# Patient Record
Sex: Male | Born: 1940 | Race: White | Hispanic: No | Marital: Single | Smoking: Never smoker
Health system: Southern US, Community
[De-identification: ages and names within clinical notes are randomized; demographics above are authoritative.]

## PROBLEM LIST (undated history)

## (undated) DIAGNOSIS — J449 Chronic obstructive pulmonary disease, unspecified: Secondary | ICD-10-CM

## (undated) DIAGNOSIS — Z87442 Personal history of urinary calculi: Secondary | ICD-10-CM

## (undated) DIAGNOSIS — E7800 Pure hypercholesterolemia, unspecified: Secondary | ICD-10-CM

## (undated) DIAGNOSIS — Z2239 Carrier of other specified bacterial diseases: Secondary | ICD-10-CM

## (undated) DIAGNOSIS — R339 Retention of urine, unspecified: Secondary | ICD-10-CM

## (undated) DIAGNOSIS — I10 Essential (primary) hypertension: Secondary | ICD-10-CM

## (undated) DIAGNOSIS — R569 Unspecified convulsions: Secondary | ICD-10-CM

## (undated) DIAGNOSIS — R319 Hematuria, unspecified: Secondary | ICD-10-CM

## (undated) DIAGNOSIS — A0472 Enterocolitis due to Clostridium difficile, not specified as recurrent: Secondary | ICD-10-CM

## (undated) DIAGNOSIS — N189 Chronic kidney disease, unspecified: Secondary | ICD-10-CM

## (undated) DIAGNOSIS — F209 Schizophrenia, unspecified: Secondary | ICD-10-CM

## (undated) DIAGNOSIS — F319 Bipolar disorder, unspecified: Secondary | ICD-10-CM

## (undated) DIAGNOSIS — K219 Gastro-esophageal reflux disease without esophagitis: Secondary | ICD-10-CM

## (undated) DIAGNOSIS — N4889 Other specified disorders of penis: Secondary | ICD-10-CM

## (undated) DIAGNOSIS — I509 Heart failure, unspecified: Secondary | ICD-10-CM

## (undated) DIAGNOSIS — I82409 Acute embolism and thrombosis of unspecified deep veins of unspecified lower extremity: Secondary | ICD-10-CM

---

## 2003-10-12 ENCOUNTER — Other Ambulatory Visit: Payer: Self-pay

## 2004-04-17 ENCOUNTER — Other Ambulatory Visit: Payer: Self-pay

## 2004-07-17 ENCOUNTER — Emergency Department: Payer: Self-pay | Admitting: Emergency Medicine

## 2004-08-28 ENCOUNTER — Ambulatory Visit: Payer: Self-pay | Admitting: Psychiatry

## 2004-09-24 ENCOUNTER — Other Ambulatory Visit: Payer: Self-pay

## 2004-09-24 ENCOUNTER — Emergency Department: Payer: Self-pay | Admitting: Emergency Medicine

## 2004-11-27 ENCOUNTER — Emergency Department: Payer: Self-pay | Admitting: Emergency Medicine

## 2005-03-02 ENCOUNTER — Emergency Department: Payer: Self-pay | Admitting: Emergency Medicine

## 2005-03-15 ENCOUNTER — Ambulatory Visit: Payer: Self-pay | Admitting: Cardiovascular Disease

## 2005-03-18 ENCOUNTER — Ambulatory Visit: Payer: Self-pay | Admitting: Cardiovascular Disease

## 2008-04-05 ENCOUNTER — Ambulatory Visit: Payer: Self-pay | Admitting: Internal Medicine

## 2008-10-04 ENCOUNTER — Ambulatory Visit: Payer: Self-pay | Admitting: Internal Medicine

## 2009-04-03 ENCOUNTER — Ambulatory Visit: Payer: Self-pay | Admitting: Internal Medicine

## 2009-06-05 ENCOUNTER — Emergency Department: Payer: Self-pay

## 2009-06-05 ENCOUNTER — Ambulatory Visit: Payer: Self-pay | Admitting: Internal Medicine

## 2010-01-19 ENCOUNTER — Ambulatory Visit: Payer: Self-pay | Admitting: Unknown Physician Specialty

## 2012-02-14 LAB — CBC
HGB: 13.6 g/dL (ref 13.0–18.0)
MCH: 28.2 pg (ref 26.0–34.0)
MCHC: 33.1 g/dL (ref 32.0–36.0)
MCV: 85 fL (ref 80–100)
RDW: 14 % (ref 11.5–14.5)
WBC: 16.6 10*3/uL — ABNORMAL HIGH (ref 3.8–10.6)

## 2012-02-14 LAB — BASIC METABOLIC PANEL
Anion Gap: 10 (ref 7–16)
Calcium, Total: 8.6 mg/dL (ref 8.5–10.1)
Chloride: 103 mmol/L (ref 98–107)
Co2: 28 mmol/L (ref 21–32)
EGFR (Non-African Amer.): >60
Glucose: 165 mg/dL — ABNORMAL HIGH (ref 65–99)
Osmolality: 285 (ref 275–301)
Potassium: 3.2 mmol/L — ABNORMAL LOW (ref 3.5–5.1)

## 2012-02-14 LAB — URINALYSIS, COMPLETE
Blood: NEGATIVE
Hyaline Cast: 3
Nitrite: NEGATIVE
Ph: 6 (ref 4.5–8.0)
Protein: NEGATIVE
Specific Gravity: 1.012 (ref 1.003–1.030)

## 2012-02-15 ENCOUNTER — Inpatient Hospital Stay: Payer: Self-pay | Admitting: Internal Medicine

## 2012-02-16 LAB — CBC WITH DIFFERENTIAL/PLATELET
Basophil #: 0 10*3/uL (ref 0.0–0.1)
Basophil %: 0.3 %
Eosinophil #: 0 10*3/uL (ref 0.0–0.7)
HGB: 12.2 g/dL — ABNORMAL LOW (ref 13.0–18.0)
Lymphocyte #: 0.8 10*3/uL — ABNORMAL LOW (ref 1.0–3.6)
MCH: 28.7 pg (ref 26.0–34.0)
MCHC: 33.5 g/dL (ref 32.0–36.0)
MCV: 86 fL (ref 80–100)
Monocyte #: 0.4 x10 3/mm (ref 0.2–1.0)
Neutrophil #: 9.6 10*3/uL — ABNORMAL HIGH (ref 1.4–6.5)
Neutrophil %: 88.1 %
WBC: 10.9 10*3/uL — ABNORMAL HIGH (ref 3.8–10.6)

## 2012-02-16 LAB — BASIC METABOLIC PANEL
Anion Gap: 8 (ref 7–16)
BUN: 20 mg/dL — ABNORMAL HIGH (ref 7–18)
Chloride: 105 mmol/L (ref 98–107)
Co2: 29 mmol/L (ref 21–32)
Creatinine: 1.14 mg/dL (ref 0.60–1.30)
EGFR (African American): >60
EGFR (Non-African Amer.): >60
Glucose: 143 mg/dL — ABNORMAL HIGH (ref 65–99)
Potassium: 3.1 mmol/L — ABNORMAL LOW (ref 3.5–5.1)
Sodium: 142 mmol/L (ref 136–145)

## 2012-02-16 LAB — URINE CULTURE

## 2012-02-20 LAB — CULTURE, BLOOD (SINGLE)

## 2012-03-29 ENCOUNTER — Emergency Department: Payer: Self-pay | Admitting: *Deleted

## 2012-03-29 LAB — CK TOTAL AND CKMB (NOT AT ARMC)
CK, Total: 99 U/L (ref 35–232)
CK-MB: 1.4 ng/mL (ref 0.5–3.6)

## 2012-03-29 LAB — COMPREHENSIVE METABOLIC PANEL
Albumin: 3.4 g/dL (ref 3.4–5.0)
Anion Gap: 9 (ref 7–16)
BUN: 11 mg/dL (ref 7–18)
Bilirubin,Total: 0.7 mg/dL (ref 0.2–1.0)
Calcium, Total: 8.9 mg/dL (ref 8.5–10.1)
Chloride: 108 mmol/L — ABNORMAL HIGH (ref 98–107)
Co2: 27 mmol/L (ref 21–32)
EGFR (African American): 55 — ABNORMAL LOW
EGFR (Non-African Amer.): 47 — ABNORMAL LOW
Osmolality: 289 (ref 275–301)
Potassium: 3 mmol/L — ABNORMAL LOW (ref 3.5–5.1)
SGOT(AST): 20 U/L (ref 15–37)
Sodium: 144 mmol/L (ref 136–145)
Total Protein: 7.5 g/dL (ref 6.4–8.2)

## 2012-03-29 LAB — CBC
HCT: 41.4 % (ref 40.0–52.0)
MCH: 29.4 pg (ref 26.0–34.0)
MCHC: 34.4 g/dL (ref 32.0–36.0)
MCV: 85 fL (ref 80–100)
Platelet: 187 10*3/uL (ref 150–440)
RDW: 14.3 % (ref 11.5–14.5)

## 2012-03-29 LAB — TROPONIN I: Troponin-I: <0.02 ng/mL

## 2012-10-02 ENCOUNTER — Inpatient Hospital Stay: Payer: Self-pay | Admitting: Internal Medicine

## 2012-10-02 LAB — COMPREHENSIVE METABOLIC PANEL
Albumin: 3.4 g/dL (ref 3.4–5.0)
Alkaline Phosphatase: 114 U/L (ref 50–136)
Anion Gap: 9 (ref 7–16)
Chloride: 105 mmol/L (ref 98–107)
Co2: 25 mmol/L (ref 21–32)
Creatinine: 1.64 mg/dL — ABNORMAL HIGH (ref 0.60–1.30)
Glucose: 194 mg/dL — ABNORMAL HIGH (ref 65–99)
SGOT(AST): 17 U/L (ref 15–37)
Sodium: 139 mmol/L (ref 136–145)
Total Protein: 7.1 g/dL (ref 6.4–8.2)

## 2012-10-02 LAB — URINALYSIS, COMPLETE
Bilirubin,UR: NEGATIVE
Glucose,UR: NEGATIVE mg/dL (ref 0–75)
Ketone: NEGATIVE
Nitrite: NEGATIVE
Ph: 7 (ref 4.5–8.0)
RBC,UR: 4 /HPF (ref 0–5)
Specific Gravity: 1.027 (ref 1.003–1.030)
WBC UR: 21 /HPF (ref 0–5)

## 2012-10-02 LAB — CBC WITH DIFFERENTIAL/PLATELET
Basophil #: 0.1 10*3/uL (ref 0.0–0.1)
Eosinophil #: 0 10*3/uL (ref 0.0–0.7)
Eosinophil %: 0.1 %
HCT: 39.4 % — ABNORMAL LOW (ref 40.0–52.0)
HGB: 13.3 g/dL (ref 13.0–18.0)
Lymphocyte #: 0.6 10*3/uL — ABNORMAL LOW (ref 1.0–3.6)
Lymphocyte %: 3.4 %
MCH: 29 pg (ref 26.0–34.0)
MCHC: 33.6 g/dL (ref 32.0–36.0)
MCV: 86 fL (ref 80–100)
Neutrophil #: 16.4 10*3/uL — ABNORMAL HIGH (ref 1.4–6.5)
Platelet: 186 10*3/uL (ref 150–440)
RBC: 4.58 10*6/uL (ref 4.40–5.90)
RDW: 14.3 % (ref 11.5–14.5)

## 2012-10-02 LAB — LIPASE, BLOOD: Lipase: 110 U/L (ref 73–393)

## 2012-10-02 LAB — MAGNESIUM: Magnesium: 1.3 mg/dL — ABNORMAL LOW

## 2012-10-02 LAB — CK TOTAL AND CKMB (NOT AT ARMC): CK-MB: 0.7 ng/mL (ref 0.5–3.6)

## 2012-10-03 LAB — CBC WITH DIFFERENTIAL/PLATELET
Basophil %: 0.8 %
HCT: 36.9 % — ABNORMAL LOW (ref 40.0–52.0)
Lymphocyte #: 1.6 10*3/uL (ref 1.0–3.6)
Lymphocyte %: 9 %
MCV: 86 fL (ref 80–100)
Monocyte #: 0.6 x10 3/mm (ref 0.2–1.0)
Monocyte %: 3.5 %
Neutrophil #: 15.4 10*3/uL — ABNORMAL HIGH (ref 1.4–6.5)
Neutrophil %: 86.5 %
Platelet: 154 10*3/uL (ref 150–440)
RBC: 4.28 10*6/uL — ABNORMAL LOW (ref 4.40–5.90)
RDW: 14.4 % (ref 11.5–14.5)
WBC: 17.8 10*3/uL — ABNORMAL HIGH (ref 3.8–10.6)

## 2012-10-03 LAB — BASIC METABOLIC PANEL
Anion Gap: 7 (ref 7–16)
BUN: 14 mg/dL (ref 7–18)
Calcium, Total: 8.2 mg/dL — ABNORMAL LOW (ref 8.5–10.1)
Creatinine: 1.02 mg/dL (ref 0.60–1.30)
EGFR (African American): >60
Glucose: 107 mg/dL — ABNORMAL HIGH (ref 65–99)
Osmolality: 282 (ref 275–301)
Potassium: 3.1 mmol/L — ABNORMAL LOW (ref 3.5–5.1)

## 2012-10-04 LAB — WBC: WBC: 10.8 10*3/uL — ABNORMAL HIGH (ref 3.8–10.6)

## 2012-10-04 LAB — URINE CULTURE

## 2012-10-07 LAB — CULTURE, BLOOD (SINGLE)

## 2014-11-15 DIAGNOSIS — G40909 Epilepsy, unspecified, not intractable, without status epilepticus: Secondary | ICD-10-CM | POA: Insufficient documentation

## 2014-11-15 DIAGNOSIS — I6523 Occlusion and stenosis of bilateral carotid arteries: Secondary | ICD-10-CM | POA: Insufficient documentation

## 2014-11-15 DIAGNOSIS — R600 Localized edema: Secondary | ICD-10-CM | POA: Insufficient documentation

## 2014-11-15 DIAGNOSIS — E782 Mixed hyperlipidemia: Secondary | ICD-10-CM | POA: Insufficient documentation

## 2014-11-15 DIAGNOSIS — E669 Obesity, unspecified: Secondary | ICD-10-CM | POA: Insufficient documentation

## 2014-11-15 DIAGNOSIS — I350 Nonrheumatic aortic (valve) stenosis: Secondary | ICD-10-CM | POA: Insufficient documentation

## 2014-11-15 DIAGNOSIS — J449 Chronic obstructive pulmonary disease, unspecified: Secondary | ICD-10-CM | POA: Insufficient documentation

## 2014-11-15 DIAGNOSIS — K219 Gastro-esophageal reflux disease without esophagitis: Secondary | ICD-10-CM | POA: Insufficient documentation

## 2014-11-25 DIAGNOSIS — I10 Essential (primary) hypertension: Secondary | ICD-10-CM | POA: Insufficient documentation

## 2014-11-29 NOTE — Consult Note (Signed)
Brief Consult Note: Diagnosis: L ureterolithiasis. R nephrolithiasis. BPH.   Patient was seen by consultant.   Consult note dictated.   Recommend further assessment or treatment.   Orders entered.   Discussed with Attending MD.   Comments: Strain urine. Provide non-NSAID analgesics and discontinue aspirin and all blood thinners if possible, so that stone can be treated if he fails to pass it within 2 weeks.  Electronic Signatures: Orson ApeWolff, Erina Hamme R (MD)  (Signed 24-Feb-14 12:19)  Authored: Brief Consult Note   Last Updated: 24-Feb-14 12:19 by Orson ApeWolff, Jaheem Hedgepath R (MD)

## 2014-11-29 NOTE — Consult Note (Signed)
PATIENT NAME:  [name], [name] MR#:  [id number] DATE OF BIRTH:  [birth year]  DATE OF CONSULTATION:  10/02/2012  REFERRING PHYSICIAN:  Enedina FinnerSona Patel, MD CONSULTING PHYSICIAN:  Suszanne ConnersMichael R. Evelene CroonWolff, MD  REASON FOR CONSULTATION: Kidney stone.   HISTORY OF PRESENT ILLNESS: Mr. [name] is a 74 year old Caucasian male who fell out of bed and landed on his back resulting in back pain. This prompted a CT scan which indicated the presence of a 6 mm proximal left ureteral stone with mild hydronephrosis and a 3 mm right lower pole stone. The patient denies flank pain or gross hematuria. He denied having any other prior stone episodes. He apparently has a history of BPH and chronically takes Avodart and Terazosin. He states that his medications are prescribed by his primary care physician who is Dr. Lacie ScottsNiemeyer and does not recall having seen a urologist recently.   ALLERGIES:  PENICILLIN.   CURRENT MEDICATIONS: Include Xopenex, vitamin D, Ventolin, terazosin, pravastatin, phenobarbital, omeprazole, Lasix, Diovan, Coreg, Colace, clonidine, Bupropion, Avodart, aspirin and Abilify.  PAST SURGICAL HISTORY: The patient denied any prior procedures, specifically kidney or prostate surgery or bladder surgery.   PAST AND CURRENT MEDICAL CONDITIONS:  1.  Bipolar disease.  2.  Epilepsy.  3.  COPD. 4.  Hypertension.  5.  GERD.  6.  Hypercholesterolemia.  7.  Schizophrenia. 8.  Benign prostatic hypertrophy.  REVIEW OF SYSTEMS: The patient denied gross hematuria, dysuria, fever, chills or flank pain. He also denied nausea and vomiting. He does have urinary symptoms including weak stream, frequency, urgency and nocturia.   PHYSICAL EXAMINATION: GENERAL: Obese and chronically ill-appearing white male in no acute distress.  HEENT: Sclerae were clear. Pupils were equal, round and reactive to light and accommodation.  NECK: Supple. No palpable cervical adenopathy.  LUNGS: Clear to auscultation.  ABDOMEN: S oft. No CVA  tenderness.  GENITOURINARY: Uncircumcised. Testes smooth, nontender, 18 mL in size each.  RECTAL: 40 gram smooth nontender prostate.   PERTINENT LABORATORY STUDIES: Include hematocrit of 39.4% with a white cell count of 17,700. BUN was 15 and creatinine 1.64.  DIAGNOSTIC DATA: CT scan report dated February 24th was reviewed.   IMPRESSION: 1.  Possible left proximal ureteral stone.  2.  Right nephrolithiasis.  3.  Benign prostatic hypertrophy.   PLAN: 1. Increase fluid intake and strain urine. Provided the patient with a strainer and instructions. 2. The 6 mm stone has a high likelihood of passing spontaneously and intervention would be indicated if he fails to pass the stone within 2 weeks or he develops intractable pain, sepsis or intractable hematuria. 3. Continue terazosin as this may help with stone expulsion.  4. Discontinue aspirin and avoid NSAIDs in case he does need ureteroscopy or lithotripsy to remove the stone, if he fails to pass it within 2 weeks. ___________________________ Suszanne ConnersMichael R. Evelene CroonWolff, MD mrw:sb D: 10/02/2012 12:32:13 ET    T: 10/02/2012 12:56:46 ET        JOB#: [id number] cc: Suszanne ConnersMichael R. Evelene CroonWolff, MD, <Dictator> Orson ApeMICHAEL R WOLFF MD ELECTRONICALLY SIGNED 10/02/2012 13:37

## 2014-11-29 NOTE — H&P (Signed)
PATIENT NAME:  [name], [name] MR#:  [id number] DATE OF BIRTH:  [birth year]  DATE OF ADMISSION:  10/02/2012  PRIMARY CARE PHYSICIAN: Dr. Lacie [name].   CHIEF COMPLAINT: Fall and dizziness today.   HISTORY OF PRESENT ILLNESS: [name] is a 74 year old morbidly obese Caucasian gentleman with history of bipolar disorder, hypertension, hypogonadism, depression, who comes to the Emergency Room after he had sustained a fall at [name]. The patient states he got out of bed, fell this morning, felt dizzy, and tried to get out of bed and felt dizzy and ended up on the floor.   There is no major injury reported in the Emergency Room. The patient initially had, en route, a  blood pressure that was stable. Heart rate dropped out into the upper 100s. He became diaphoretic. He was found noted to be tachycardic in the 100s. UA showed 1+ leukocyte esterase, with positive WBCs.   CT of the abdomen, pelvis showed mild hydronephrosis on the left secondary to a 6-mm stone in the proximal portion of the left ureter.   The patient denies any nausea. Complains of a fever and diaphoresis yesterday at [name]. He was also noted to have a white count of 17.7 and an elevated creatinine of 1.64. His baseline creatinine was 1.1 in July of 2003.   He is being admitted for sepsis secondary to possible acute pyelo, secondary to nephrolithiasis and acute renal failure.   The patient is receiving IV fluids, and he has received a dose of IV Rocephin. Blood cultures and urine cultures have been sent to the lab.   PAST MEDICAL HISTORY:  1. Depression.  2. Hypogonadism.  3. BPH.  4. Hypercholesterolemia.  5. Bipolar disorder.  6. COPD.  7. GERD.  8. Hypertension.  9. History of schizophrenia.  10. History of seizure disorder.   MEDICATIONS:  1. Vitamin D3, 1000 units daily.  2. Ventolin HFA 2 puffs q. 6 h. as needed.  3. Terazosin 5 mg p.o. daily.  4. Pravastatin 40 mg daily.  5.  Phenobarbital 64.8 mg, two tablets every other day.  6. Omeprazole 20 mg daily.  7. Levalbuterol nebulizers, 1.25 mg, which is 3 mL inhaled three times a day as needed.  8. Lasix 40 mg twice daily.   9. Docusate one tablet daily. 10. Diovan 320 p.o. daily.  11. Clonidine 0.2 mg twice daily.  12. Carvedilol 25 mg twice daily.  13. Bupropion XL 300 mg daily.  14. Avodart 0.5 mg daily.  15. Aspirin 81 mg daily.  16. Amlodipine 5 mg daily.  17. Amitizia 24 mcg p.o. twice daily.  18. Tylenol 500 q. 6., p.r.n.  19. Abilify 20 mg daily.   ALLERGIES: PENICILLIN.   SOCIAL HISTORY: The patient is a nonsmoker. No alcohol use. He lives at [name]. He reports working in [name] before.   FAMILY HISTORY: Positive for hypertension.   REVIEW OF SYSTEMS.  CONSTITUTIONAL: No fever. Positive for fatigue, weakness.  EYES: No blurred or double vision, or glaucoma.   ENT: No tinnitus, ear pain, hearing loss.  RESPIRATORY: No cough, wheeze, hemoptysis, or chronic obstructive pulmonary disease.  CARDIOVASCULAR: No chest pain, orthopnea. Positive for leg edema, which is chronic.  Positive for hypertension.  GASTROINTESTINAL: No nausea, vomiting, diarrhea, or abdominal pain.  GENITOURINARY: No dysuria, hematuria, or frequency.  ENDOCRINE: No polyuria, nocturia, thyroid problems.  HEMATOLOGY: No anemia or easy bruising.  SKIN: The patient has chronic lymphedematous changes in both the lower extremities.  MUSCULOSKELETAL: Positive for arthritis.  NEUROLOGIC: No CVA or TIAs.  PSYCH: Positive for bipolar and schizophrenia.   All other systems reviewed and negative.   PHYSICAL EXAMINATION: GENERAL: The patient is awake, alert, oriented x3, not in acute distress. Morbidly obese. VITAL SIGNS: He is afebrile, pulse is 102, regular. Respirations 21, blood pressure is 149/66. Sats are 98% on room air.  HEENT: Atraumatic, normocephalic. PERRLA. EOMI intact. Oral mucosa is dry.  NECK: Supple. No JVD. No  carotid bruit.  LUNGS: Clear to auscultation bilaterally. No rales, rhonchi, respiratory distress, or labored breathing.  CARDIOVASCULAR: Tachycardia present. Both the heart sounds are normal. No murmur heard. PMI not lateralized.  EXTREMITIES: The patient has chronic venous stasis changes along with chronic bilateral lymphedema, with hyperpigmentation in both the lower extremities. Unable to palpate pulses secondary to his significant edema.  SKIN: Looks okay. Appears to be at baseline.  NEURO EXAM: Grossly intact cranial nerves II-XII. No motor or sensory deficits.  PSYCH: The patient is awake, alert, oriented x3.  SKIN: Warm and dry.   LABS:  EKG shows sinus tachycardia. B-type natriuretic peptide is 435.   Cardiac enzymes: First set negative. Glucose is 194. BUN 15, creatinine 1.64, sodium is 139, potassium 3.3, chloride is 105, bicarb is 25, calcium is 8.5, bilirubin is 0.6.   His white count is 17.7, H and H is  13.3 and 39.4, platelet count is 186.   CHEST X-RAY: No acute cardiopulmonary abnormality.   CT of the head shows no acute intracranial abnormality.   CT angiography of the chest, abdomen, and pelvis shows no evidence of abdominal aortic aneurysm or dissection. There is mild hydronephrosis on the  left secondary to 6-mm stone in the proximal portion of the left ureter. There are hypodensities in both kidneys, compatible with cortical cysts.   UA positive for UTI.   ASSESSMENT:  A 74 year old [name] with a history of bipolar/schizophrenia, hypertension, morbid obesity, chronic obstructive pulmonary disease, who comes in with fall at [name]. Is being admitted with:   1. Sepsis, suspected due to acute pyelonephritis secondary to left ureterolithiasis, with positive  urinalysis, tachycardia, transient hypotension: The patient is going to be admitted to the medical floor. Will continue IV fluids for hydration. Continue IV Rocephin. Follow up blood cultures and urine  cultures. Will also have urology see patient for ureterolithiasis.  2. Acute left proximal ureterolithiasis with mild hydronephrosis on the left. I will continue IV hydration, have strain patient's urine to ensure if stone is passed. Will have urology see the patient in consultation; p.r.n. pain medications.  3. Hypertension: I will continue the patient's clonidine and amlodipine, along with carvedilol.  I will hold off on Lasix, then add Diovan given elevated creatinine.  4. Acute renal failure: Appears prerenal azotemia secondary to #1. Baseline creatinine is 1.1. Will continue IV hydration, follow up intakes and outputs. Avoid nephrotoxins, and monitor creatinine.  5. Gastroesophageal reflux disease: Continue omeprazole.  6. History of seizure disorder: On phenobarbital which we will continue.  7. Benign prostatic hypertrophy: Continue terazosin.  8. History of bipolar disorder/schizophrenia: Will continue patient's Abilify, bupropion.  9. Chronic obstructive pulmonary disease: Continue oral inhalers and p.r.n. Xopenex nebulizers.  10. Deep vein thrombosis: Prophylaxis with subcutaneous heparin.  11. Further workup according to the patient's clinical course.   Hospital admission plan was discussed with the patient. No family members present in the Emergency Room.   Time spent: 50 minutes.     ____________________________ Wylie Hail Allena Katz,  MD sap:dm D: 10/02/2012 10:49:04 ET T: 10/02/2012 11:44:15 ET JOB#: [id number]  cc: Alenna Russell A. Allena Katz, MD, <Dictator> Meindert A. [name] Scotts, MD Willow Ora MD ELECTRONICALLY SIGNED 10/05/2012 14:33

## 2014-11-29 NOTE — Discharge Summary (Signed)
PATIENT NAME:  [name], [name] MR#:  [id number] DATE OF BIRTH:  [birth year]  DATE OF ADMISSION:  10/02/2012 DATE OF DISCHARGE:  10/04/2012  ADMITTING PHYSICIAN: Sona A. Allena KatzPatel, MD  DISCHARGING PHYSICIAN: Enid Baasadhika Elgin Carn, MD  PRIMARY CARE PHYSICIAN: Meindert A. Lacie ScottsNiemeyer, MD  CONSULTATIONS IN THE HOSPITAL: Urology consultation by Dr. Evelene CroonWolff.  DISCHARGE DIAGNOSES:  1.  Systemic inflammatory response syndrome.  2.  Acute pyelonephritis.  3.  Nephrolithiasis.  4.  Streptococcus agalactiae urinary tract infection. 5.  Benign prostatic hypertrophy.  6.  Mild left-sided hydronephrosis with 6 mm proximal left ureteral stone. 7.  History of seizure disorder.  8.  Bipolar disorder and schizophrenia.  9.  Acute renal failure.  10.  Gastroesophageal reflux disease.  11.  Chronic obstructive pulmonary disease.   DISCHARGE HOME MEDICATIONS:  1.  Levalbuterol 3 mL inhalation solution 3 times a day as needed for wheezing or shortness of breath.  2.  Ammonium lactate 12% topical cream to affected area twice a day.  3.  Tylenol 500 mg p.o. q.6 hours as needed for pain.  4.  Amitiza 24 mcg p.o. b.i.d.  5.  Carvedilol 25 mg p.o. b.i.d.  6.  Clonidine 0.2 mg orally twice a day.  7.  Colace 100 mg p.o. daily at bedtime.  8.  Norvasc 5 mg p.o. daily.  9.  Avodart 0.5 mg capsule daily.  10.  Diovan 320 mg p.o. daily.  11.  Phenobarbital 64.8 mg 2 tablets every other day at bedtime.  12.  Abilify 20 mg p.o. daily.  13.  Bupropion 300 mg 1 tablet orally once a day.  14.  Omeprazole 40 mg p.o. daily.  15.  Pravastatin 40 mg p.o. daily.  16.  Terazosin 5 mg p.o. daily.  17.  Vitamin D3, 1000 international units capsule daily.  18.  Ventolin 2 puffs q.6 hours p.r.n.  19.  Phenobarbital 64.8 mg 1 capsule every other day alternating with the other phenobarbital dose.  20.  Oxycodone 5 mg oral capsule every 8 hours as needed for pain.  21.  Keflex 500 mg p.o. q.8 hours for 12 more days.   DISCHARGE  HOME OXYGEN: None.   DISCHARGE DIET: Regular diet.   DISCHARGE ACTIVITY: As tolerated.   FOLLOWUP INSTRUCTIONS:  1.  Primary care physician followup in 1 week.  2.  Urology followup in 2 weeks.  3.  Hold aspirin until stone is passed, as risk of hematuria is high. 4.  Advised to hold Lasix for the next 4 to 5 days and reassess prior to restarting it.   LABORATORY AND IMAGING STUDIES: WBC 10.8, hemoglobin 12.4, hematocrit 36.9, platelet count 154. Sodium 141, potassium 3.1, chloride 106, bicarbonate 28, BUN 14, creatinine 1.02, glucose 107, calcium 8.2. Blood cultures are negative. Urine cultures positive for greater than 100,000 colonies of Streptococcus agalactiae.   CT angiogram of chest, pelvis and abdomen showing no evidence of thoracic or abdominal aortic aneurysm or dissection. Mild hydronephrosis on the left side secondary to a 6 mm proximal left ureteral stone. Hyperdensities in both kidneys consistent with cortical cysts and nonobstructing 3 mm stone in the lower pole of the right kidney. No bowel abnormalities seen. 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 Chest x-ray: Mild pulmonary vascular congestion.   BRIEF HOSPITAL COURSE: The patient is a 74 year old obese male with past medical history significant for depression, hypogonadism, COPD, bipolar and schizophrenia, brought  in from assisted living secondary to fall and also dizziness. He was found to have a UTI and also small left ureter stone. He was admitted for possible systemic inflammatory response syndrome.  1.  Systemic inflammatory response syndrome: Likely secondary to pyelonephritis. Started on fluids. Urine cultures were growing greater than 100,000 colonies of Streptococcus agalactiae. Blood cultures were negative. He was on Rocephin while in the hospital and he is being discharged on Keflex. Because of his  underlying 6 mm stone, he was seen by Dr. Evelene Croon from urology who recommended fluids, antibiotics, and the stone because it is nonobstructing will pass by itself and recommended outpatient followup. He only has mild benign prostatic hypertrophy. The patient has not had any fevers while in the hospital. Did work with physical therapy who recommended going back to assisted living at this time.  2.  Acute renal failure: Likely secondary to dehydration prior to admission. Improved with IV fluids. His Diovan is being restarted at the time of discharge. Continue to hold Lasix for the next 5 days and reassess prior to restarting it.   3.  Bipolar, schizophrenia and depression: All his home medications are being continued at this time.  4.  Hypertension: Continue home medications.  5.  His course has been otherwise uneventful in the hospital.   DISCHARGE CONDITION: Stable.   DISCHARGE DISPOSITION: [name].   TIME SPENT ON DISCHARGE: 40 minutes.    ____________________________ Enid Baas, MD rk:jm D: 10/04/2012 16:09:31 ET T: 10/04/2012 17:08:43 ET JOB#: [id number]  cc: Enid Baas, MD, <Dictator> Enid Baas MD ELECTRONICALLY SIGNED 10/11/2012 13:37

## 2014-12-01 NOTE — H&P (Signed)
PATIENT NAME:  [name] DarbyCRAWFORD, Vinicio L MR#:  [id number] DATE OF BIRTH:  1941/07/04  DATE OF ADMISSION:  02/15/2012  PRIMARY CARE PHYSICIAN: Evelene CroonMeindert Niemeyer, MD   CHIEF COMPLAINT: Fever, back pain, and abdominal pain.   HISTORY OF PRESENT ILLNESS: Mr. [name] DupreCrawford is a 74 year old Caucasian male who is a poor historian presented with fever and lower abdominal pain. However, when I asked him why he came to the hospital he said he fell and he has back pain. Nevertheless, his CAT scan shows some fat stranding and increased attenuation around the rectosigmoid area raising question whether there is inflammation or infection in that area. The patient denies having any vomiting. No diarrhea. No rectal bleeding. On the other hand, he stated that while he tried to sit he missed the chair and he fell on his back. This happened today and he is complaining of lower abdominal pain and also low back pain.   REVIEW OF SYSTEMS: CONSTITUTIONAL: He has fever. No chills but he has fatigue. EYES: No blurring of vision. No double vision. ENT: No hearing impairment. No sore throat. No dysphagia. CARDIOVASCULAR: No chest pain. No shortness of breath, although he is wheezing. No syncope. He has peripheral edema and lymphedema. This is chronic for him. RESPIRATORY: Denies cough. No shortness of breath. No chest pain. GASTROINTESTINAL: Reports lower abdominal pain. No vomiting. No diarrhea. No hematochezia. GENITOURINARY: No dysuria or frequency of urination. MUSCULOSKELETAL: No joint pain or swelling other than his low back pain. No muscular pain or swelling. INTEGUMENTARY: No skin rash other than his chronic rash over the lower extremities. Also, mild degree of cellulitis. No ulcer. NEUROLOGY: No focal weakness. No seizure activity currently but he has history of epilepsy. No headache. PSYCHIATRY: No anxiety but he has bipolar manic depressive illness. ENDOCRINE: No polyuria or polydipsia. No heat or cold intolerance.   PAST MEDICAL  HISTORY:  1. Bipolar manic depressive illness. 2. Epilepsy. 3. Chronic obstructive pulmonary disease. 4. Hypertension. 5. Gastroesophageal reflux disease. 6. Hypercholesterolemia. 7. Benign prostatic hypertrophy.  8. Schizophrenia.   SOCIAL HABITS: Nonsmoker. No history of alcohol abuse.   SOCIAL HISTORY: He is single, lives at home, retired. He reports that he worked in [employer] before.   ADMISSION MEDICATIONS:  1. Xopenex treatment three times a day p.r.n.  2. Vitamin D 1000 units once a day. 3. Ventolin HFA 2 puffs q.6 hours p.r.n. 4. Terazosin 5 mg once a day. 5. Pravastatin 40 mg a day.  6. Phenobarbital 64.8 mg once a day.  7. Omeprazole 40 mg a day  8. Lasix 40 mg twice a day. 9. Diovan 160 mg once a day. 10. Coreg 25 mg twice a day.  11. Colace 100 mg a day. 12. Clonidine 0.1 mg twice a day.  13. Bupropion 300 mg once a day. This is extended-release or XL.  14. Avodart 0.5 mg once a day.  15. Aspirin 81 mg a day. 16. Abilify 20 mg once a day.   ALLERGIES: Penicillin.   PHYSICAL EXAMINATION:   VITAL SIGNS: Blood pressure 187/97, respiratory rate 22, pulse 117, temperature 99.5, oxygen saturation 93%.   GENERAL APPEARANCE: Elderly male, morbidly obese, laying in bed in no acute distress.   HEAD: No pallor. No icterus. No cyanosis.  EARS, NOSE, AND THROAT: Hearing was normal. Nasal mucosa, lips, tongue were normal.   EYES: Normal iris and conjunctivae. Pupils about 6 mm, equal and reactive to light.   NECK: Supple. Trachea at midline. No thyromegaly. No cervical masses.  HEART: Distant heart sounds. Faint S1 and S2. No S3 or S4. No murmur. No gallop. No carotid bruits.   RESPIRATORY: Slight tachypnea. Prolonged expiratory phase. Few scattered wheezing. No rales.   ABDOMEN: Morbidly obese, soft without tenderness. No hepatosplenomegaly. No masses. No hernias.   SKIN: Erythema from the ankles way up just below the knee bilaterally associated with lymphedema  and edema.   MUSCULOSKELETAL: No joint swelling. No clubbing.   NEUROLOGIC: Cranial nerves II through XII are intact. No focal motor deficit.   PSYCHIATRY: The patient is alert to place and people. Mood and affect were flat.   LABORATORY, DIAGNOSTIC, AND RADIOLOGICAL DATA: Chest x-ray showed cardiomegaly. No consolidation. No effusion.   CAT scan of the abdomen revealed stool in the rectosigmoid area along with increased attenuation in the bowel wall of this area with some fat stranding raising question about inflammation or infection in this area. There is evidence of bilateral nephrolithiasis that is not obstructing.   His CBC showed elevated white count reaching 16,600, hemoglobin 13, hematocrit 41, platelet count 175, serum glucose 165, BUN 14, creatinine 1.1, sodium 141, potassium 3.2. Urinalysis was unremarkable.   ASSESSMENT:  1. Mild abdominal pain associated with some attenuation and fat stranding in rectosigmoid area raising suspicions for inflammation or infection.  2. Fever either related to the rectosigmoid inflammatory process or other source, possibility of cellulitis as the source as well.  3. Leukocytosis. 4. Low back pain status post fall today.  5. Lower extremity cellulitis, lymphedema, and edema.  6. Diabetes mellitus type II. 7. Hypertension. 8. Chronic obstructive pulmonary disease. 9. Schizophrenia. 10. Seizure activity. 11. Bipolar manic depressive illness and schizophrenia. 12. History of benign prostatic hypertrophy. 13. Hypercholesterolemia.   PLAN:  1. Will admit the patient to the medical floor. 2. Intravenous ciprofloxacin along with Flagyl was started. This will cover for any bowel inflammatory process and have some coverage for his cellulitis as well.  3. Continue home medications as listed above and monitor blood pressure as it is uncontrolled. I question whether there is compliance with his medications.  4. Continue Xopenex treatment p.r.n.   5. Treatment for his constipation with MiraLAX, Senokot, and one dose of Milk of Magnesia.  TIME SPENT EVALUATING THIS PATIENT: More than 55 minutes.   ____________________________ Carney Corners. Rudene Re, MD amd:drc D: 02/15/2012 01:01:57 ET T: 02/15/2012 07:16:14 ET JOB#: [id number]  cc: Carney Corners. Rudene Re, MD, <Dictator> Meindert A. Lacie Scotts, MD Carney Corners Jadavion Spoelstra MD ELECTRONICALLY SIGNED 02/17/2012 1:35

## 2014-12-01 NOTE — Discharge Summary (Signed)
PATIENT NAME:  [name], [name] MR#:  [id number] DATE OF BIRTH:  [birth year]  DATE OF ADMISSION:  02/15/2012 DATE OF DISCHARGE:    PRIMARY CARE PHYSICIAN: Evelene CroonMeindert Niemeyer, MD  ADDENDUM: The patient's discharge was canceled due to high blood pressure. The patient's blood pressure was more than 180 yesterday and was treated with labetalol and hydralazine IV. In addition, hydralazine 25 mg four times daily was added. Clonidine was increased to 0.2 mg twice a day. The patient's blood pressure this morning was high, about 200, but decreased to 156. The patient has no symptoms, no complaints. He is clinically stable and will be discharged to a group home today.   For detailed discharge summary, discharge plan, and discharge instructions please refer [facility], which is updated today. I discussed the discharge plan with the nurse and case manager. ____________________________ Shaune PollackQing Nakia Koble, MD qc:slb D: 02/17/2012 13:51:51 ET T: 02/17/2012 15:36:10 ET JOB#: [id number]  cc: Shaune PollackQing Srija Southard, MD, <Dictator> Meindert A. Lacie ScottsNiemeyer, MD Shaune PollackQING Louana Fontenot MD ELECTRONICALLY SIGNED 02/18/2012 15:36

## 2014-12-01 NOTE — H&P (Signed)
PATIENT NAME:  [name] DarbyCRAWFORD, Alyx L MR#:  [id number] DATE OF BIRTH:  08/23/1940  DATE OF ADMISSION:  02/15/2012  ADDENDUM   Patient reports that he does have a LIVING WILL and also he had given the power of attorney to one member of his church.  ____________________________ Carney CornersAmir M. Rudene Rearwish, MD amd:cms D: 02/15/2012 01:02:48 ET T: 02/15/2012 07:20:20 ET JOB#: [id number]  cc: Carney CornersAmir M. Rudene Rearwish, MD, <Dictator> Karolee OhsAMIR Dala DockM Eddis Pingleton MD ELECTRONICALLY SIGNED 02/17/2012 1:35

## 2014-12-01 NOTE — Discharge Summary (Signed)
PATIENT NAME:  [name] DarbyCRAWFORD, Mousa L MR#:  [id number] DATE OF BIRTH:  1940-12-13  DATE OF ADMISSION:  02/15/2012 DATE OF DISCHARGE:  02/16/2012  PRIMARY CARE PHYSICIAN:  Dr. Lacie ScottsNiemeyer  DISCHARGE DIAGNOSES:  1. Possible colitis.  2. Leukocytosis, improved.  3. Bilateral leg cellulitis, chronic.  4. Hypertension.  5. Diabetes.  6. Chronic obstructive pulmonary disease.   CONDITION: Stable.   HOME MEDICATIONS: [facility].   ADDITIONAL MEDICATIONS:   Cipro 500 mg p.o. b.i.d. for five days.   ACTIVITY: As tolerated.   FOLLOW-UP CARE:  Follow up CBC within 1 week.   REASON FOR ADMISSION: Fever, back pain, and abdominal pain.   HOSPITAL COURSE: The patient is a 74 year old Caucasian male with a history of hypertension, diabetes, gastroesophageal reflux disease, benign prostatic hypertrophy who presented to the Emergency Department with abdominal pain and back pain The patient is a poor historian. CT scan showed some fat stranding and increased attenuation around the rectosigmoid area, suspected inflammation or infection.  CBC: White blood cell count was 16,600, hemoglobin 13, platelets 175. BUN 14, creatinine 1.1, potassium 3.2 on admission. Urinalysis was unremarkable.   1.  The patient was admitted for mild abdominal pain associated with abnormal CT findings suspicious for inflammation or infection, possible colitis. After admission the patient was treated with Cipro and Flagyl. Abdominal pain has improved. The patient has no complaints. No fever, chills, nausea, vomiting, or diarrhea.  2. Chronic cellulitis. The patient has bilateral leg chronic edema and cellulitis, which is the same as before.  3. Leukocytosis. White count decreased to 10.9 today, improved.  4. Diabetes, controlled.  5. Hypertension, uncontrolled. The patient's blood pressure is high now, about 171. We will give hypertension medication and repeat blood pressure.   Since the patient is  clinically stable the patient will be discharged to group home today. Discussed the patient's discharge plan with the nurse and case manager.   TIME SPENT: About 35 minutes.    ____________________________ Shaune PollackQing Kalem [name], MD qc:bjt D: 02/16/2012 15:15:08 ET T: 02/17/2012 10:36:12 ET JOB#: [id number]  cc: Shaune PollackQing Achol Azpeitia, MD, <Dictator> Meindert A. Lacie ScottsNiemeyer, MD Shaune PollackQING Cherika Jessie MD ELECTRONICALLY SIGNED 02/17/2012 14:56

## 2014-12-04 DIAGNOSIS — I872 Venous insufficiency (chronic) (peripheral): Secondary | ICD-10-CM | POA: Insufficient documentation

## 2015-02-21 ENCOUNTER — Emergency Department: Payer: Medicare Other

## 2015-02-21 ENCOUNTER — Encounter: Payer: Self-pay | Admitting: Medical Oncology

## 2015-02-21 ENCOUNTER — Inpatient Hospital Stay
Admission: EM | Admit: 2015-02-21 | Discharge: 2015-02-24 | DRG: 194 | Disposition: A | Discharge status: Skilled Nursing Facility | Payer: Medicare Other | Attending: Internal Medicine | Admitting: Internal Medicine

## 2015-02-21 DIAGNOSIS — Z87440 Personal history of urinary (tract) infections: Secondary | ICD-10-CM

## 2015-02-21 DIAGNOSIS — J449 Chronic obstructive pulmonary disease, unspecified: Secondary | ICD-10-CM | POA: Diagnosis present

## 2015-02-21 DIAGNOSIS — E119 Type 2 diabetes mellitus without complications: Secondary | ICD-10-CM | POA: Diagnosis present

## 2015-02-21 DIAGNOSIS — N200 Calculus of kidney: Secondary | ICD-10-CM | POA: Diagnosis present

## 2015-02-21 DIAGNOSIS — Z86718 Personal history of other venous thrombosis and embolism: Secondary | ICD-10-CM

## 2015-02-21 DIAGNOSIS — Z8249 Family history of ischemic heart disease and other diseases of the circulatory system: Secondary | ICD-10-CM

## 2015-02-21 DIAGNOSIS — Z79899 Other long term (current) drug therapy: Secondary | ICD-10-CM | POA: Diagnosis not present

## 2015-02-21 DIAGNOSIS — K219 Gastro-esophageal reflux disease without esophagitis: Secondary | ICD-10-CM | POA: Diagnosis present

## 2015-02-21 DIAGNOSIS — I509 Heart failure, unspecified: Secondary | ICD-10-CM | POA: Diagnosis present

## 2015-02-21 DIAGNOSIS — N179 Acute kidney failure, unspecified: Secondary | ICD-10-CM | POA: Diagnosis present

## 2015-02-21 DIAGNOSIS — J189 Pneumonia, unspecified organism: Principal | ICD-10-CM | POA: Diagnosis present

## 2015-02-21 DIAGNOSIS — E876 Hypokalemia: Secondary | ICD-10-CM | POA: Diagnosis not present

## 2015-02-21 DIAGNOSIS — Z7982 Long term (current) use of aspirin: Secondary | ICD-10-CM | POA: Diagnosis not present

## 2015-02-21 DIAGNOSIS — F209 Schizophrenia, unspecified: Secondary | ICD-10-CM | POA: Diagnosis present

## 2015-02-21 DIAGNOSIS — F319 Bipolar disorder, unspecified: Secondary | ICD-10-CM | POA: Diagnosis present

## 2015-02-21 DIAGNOSIS — I248 Other forms of acute ischemic heart disease: Secondary | ICD-10-CM | POA: Diagnosis present

## 2015-02-21 DIAGNOSIS — R1084 Generalized abdominal pain: Secondary | ICD-10-CM

## 2015-02-21 DIAGNOSIS — R51 Headache: Secondary | ICD-10-CM | POA: Diagnosis present

## 2015-02-21 DIAGNOSIS — Y95 Nosocomial condition: Secondary | ICD-10-CM | POA: Diagnosis present

## 2015-02-21 DIAGNOSIS — E780 Pure hypercholesterolemia: Secondary | ICD-10-CM | POA: Diagnosis present

## 2015-02-21 DIAGNOSIS — I10 Essential (primary) hypertension: Secondary | ICD-10-CM | POA: Diagnosis present

## 2015-02-21 DIAGNOSIS — D72829 Elevated white blood cell count, unspecified: Secondary | ICD-10-CM | POA: Diagnosis present

## 2015-02-21 DIAGNOSIS — W19XXXA Unspecified fall, initial encounter: Secondary | ICD-10-CM | POA: Diagnosis present

## 2015-02-21 HISTORY — DX: Heart failure, unspecified: I50.9

## 2015-02-21 HISTORY — DX: Schizophrenia, unspecified: F20.9

## 2015-02-21 HISTORY — DX: Essential (primary) hypertension: I10

## 2015-02-21 HISTORY — DX: Chronic obstructive pulmonary disease, unspecified: J44.9

## 2015-02-21 HISTORY — DX: Pure hypercholesterolemia, unspecified: E78.00

## 2015-02-21 HISTORY — DX: Gastro-esophageal reflux disease without esophagitis: K21.9

## 2015-02-21 HISTORY — DX: Bipolar disorder, unspecified: F31.9

## 2015-02-21 HISTORY — DX: Acute embolism and thrombosis of unspecified deep veins of unspecified lower extremity: I82.409

## 2015-02-21 HISTORY — DX: Unspecified convulsions: R56.9

## 2015-02-21 LAB — MAGNESIUM: Magnesium: 2.1 mg/dL (ref 1.7–2.4)

## 2015-02-21 LAB — COMPREHENSIVE METABOLIC PANEL
ALT: 12 U/L — AB (ref 17–63)
ANION GAP: 16 — AB (ref 5–15)
AST: 20 U/L (ref 15–41)
Albumin: 3.1 g/dL — ABNORMAL LOW (ref 3.5–5.0)
Alkaline Phosphatase: 83 U/L (ref 38–126)
BUN: 46 mg/dL — AB (ref 6–20)
CO2: 32 mmol/L (ref 22–32)
Calcium: 8.4 mg/dL — ABNORMAL LOW (ref 8.9–10.3)
Chloride: 92 mmol/L — ABNORMAL LOW (ref 101–111)
Creatinine, Ser: 2.4 mg/dL — ABNORMAL HIGH (ref 0.61–1.24)
GFR calc Af Amer: 29 mL/min — ABNORMAL LOW (ref >60)
GFR calc non Af Amer: 25 mL/min — ABNORMAL LOW (ref >60)
GLUCOSE: 187 mg/dL — AB (ref 65–99)
POTASSIUM: 2 mmol/L — AB (ref 3.5–5.1)
Sodium: 140 mmol/L (ref 135–145)
Total Bilirubin: 1 mg/dL (ref 0.3–1.2)
Total Protein: 7.6 g/dL (ref 6.5–8.1)

## 2015-02-21 LAB — URINALYSIS COMPLETE WITH MICROSCOPIC (ARMC ONLY)
BILIRUBIN URINE: NEGATIVE
Bacteria, UA: NONE SEEN
Glucose, UA: NEGATIVE mg/dL
HGB URINE DIPSTICK: NEGATIVE
KETONES UR: NEGATIVE mg/dL
Leukocytes, UA: NEGATIVE
NITRITE: NEGATIVE
PH: 5 (ref 5.0–8.0)
Protein, ur: NEGATIVE mg/dL
Specific Gravity, Urine: 1.011 (ref 1.005–1.030)

## 2015-02-21 LAB — CBC WITH DIFFERENTIAL/PLATELET
BASOS ABS: 0.1 10*3/uL (ref 0–0.1)
Basophils Relative: 1 %
EOS ABS: 0 10*3/uL (ref 0–0.7)
Eosinophils Relative: 0 %
HEMATOCRIT: 35.7 % — AB (ref 40.0–52.0)
Hemoglobin: 12.2 g/dL — ABNORMAL LOW (ref 13.0–18.0)
LYMPHS ABS: 1.1 10*3/uL (ref 1.0–3.6)
LYMPHS PCT: 8 %
MCH: 28.6 pg (ref 26.0–34.0)
MCHC: 34.2 g/dL (ref 32.0–36.0)
MCV: 83.7 fL (ref 80.0–100.0)
MONO ABS: 0.9 10*3/uL (ref 0.2–1.0)
Monocytes Relative: 6 %
NEUTROS ABS: 11.9 10*3/uL — AB (ref 1.4–6.5)
NEUTROS PCT: 85 %
Platelets: 212 10*3/uL (ref 150–440)
RBC: 4.26 MIL/uL — ABNORMAL LOW (ref 4.40–5.90)
RDW: 13.7 % (ref 11.5–14.5)
WBC: 14 10*3/uL — AB (ref 3.8–10.6)

## 2015-02-21 LAB — GLUCOSE, CAPILLARY
GLUCOSE-CAPILLARY: 113 mg/dL — AB (ref 65–99)
Glucose-Capillary: 215 mg/dL — ABNORMAL HIGH (ref 65–99)

## 2015-02-21 LAB — TROPONIN I
TROPONIN I: 0.05 ng/mL — AB (ref <0.031)
Troponin I: 0.04 ng/mL — ABNORMAL HIGH (ref <0.031)

## 2015-02-21 LAB — LIPASE, BLOOD: Lipase: 25 U/L (ref 22–51)

## 2015-02-21 MED ORDER — ASPIRIN EC 81 MG PO TBEC
81.0000 mg | DELAYED_RELEASE_TABLET | Freq: Every day | ORAL | Status: DC
  Administered 2015-02-22 – 2015-02-24 (×3): 81 mg via ORAL
  Filled 2015-02-21 (×3): qty 1

## 2015-02-21 MED ORDER — POTASSIUM CHLORIDE CRYS ER 20 MEQ PO TBCR
40.0000 meq | EXTENDED_RELEASE_TABLET | Freq: Two times a day (BID) | ORAL | Status: AC
  Administered 2015-02-21 – 2015-02-22 (×4): 40 meq via ORAL
  Filled 2015-02-21 (×4): qty 2

## 2015-02-21 MED ORDER — SODIUM CHLORIDE 0.9 % IJ SOLN
3.0000 mL | INTRAMUSCULAR | Status: DC | PRN
Start: 2015-02-21 — End: 2015-02-24
  Administered 2015-02-24: 3 mL via INTRAVENOUS
  Filled 2015-02-21: qty 10

## 2015-02-21 MED ORDER — POTASSIUM CHLORIDE 10 MEQ/100ML IV SOLN
10.0000 meq | INTRAVENOUS | Status: AC
  Administered 2015-02-21 (×2): 10 meq via INTRAVENOUS
  Filled 2015-02-21 (×2): qty 100

## 2015-02-21 MED ORDER — POTASSIUM CHLORIDE IN NACL 40-0.9 MEQ/L-% IV SOLN
INTRAVENOUS | Status: DC
  Administered 2015-02-21 – 2015-02-22 (×2): 75 mL/h via INTRAVENOUS
  Filled 2015-02-21 (×4): qty 1000

## 2015-02-21 MED ORDER — ARIPIPRAZOLE 10 MG PO TABS
20.0000 mg | ORAL_TABLET | Freq: Every day | ORAL | Status: DC
  Administered 2015-02-22 – 2015-02-24 (×3): 20 mg via ORAL
  Filled 2015-02-21 (×4): qty 2

## 2015-02-21 MED ORDER — DEXTROSE 5 % IV SOLN
500.0000 mg | Freq: Once | INTRAVENOUS | Status: DC
  Filled 2015-02-21: qty 500

## 2015-02-21 MED ORDER — AMLODIPINE BESYLATE 5 MG PO TABS
5.0000 mg | ORAL_TABLET | Freq: Every day | ORAL | Status: DC
  Administered 2015-02-22 – 2015-02-24 (×3): 5 mg via ORAL
  Filled 2015-02-21 (×3): qty 1

## 2015-02-21 MED ORDER — INSULIN ASPART 100 UNIT/ML ~~LOC~~ SOLN
0.0000 [IU] | Freq: Three times a day (TID) | SUBCUTANEOUS | Status: DC
  Administered 2015-02-21: 3 [IU] via SUBCUTANEOUS
  Administered 2015-02-22: 5 [IU] via SUBCUTANEOUS
  Administered 2015-02-23: 3 [IU] via SUBCUTANEOUS
  Administered 2015-02-23: 2 [IU] via SUBCUTANEOUS
  Administered 2015-02-24 (×2): 1 [IU] via SUBCUTANEOUS
  Filled 2015-02-21: qty 2
  Filled 2015-02-21: qty 1
  Filled 2015-02-21: qty 3
  Filled 2015-02-21: qty 5
  Filled 2015-02-21: qty 1
  Filled 2015-02-21: qty 3

## 2015-02-21 MED ORDER — PHENOBARBITAL 32.4 MG PO TABS
129.6000 mg | ORAL_TABLET | ORAL | Status: DC
  Administered 2015-02-23: 129.6 mg via ORAL
  Filled 2015-02-21: qty 4

## 2015-02-21 MED ORDER — SODIUM CHLORIDE 0.9 % IV SOLN: 250.0000 mL | INTRAVENOUS | Status: DC | PRN

## 2015-02-21 MED ORDER — DOCUSATE SODIUM 100 MG PO CAPS
100.0000 mg | ORAL_CAPSULE | Freq: Two times a day (BID) | ORAL | Status: DC
  Administered 2015-02-21 – 2015-02-24 (×7): 100 mg via ORAL
  Filled 2015-02-21 (×7): qty 1

## 2015-02-21 MED ORDER — VANCOMYCIN HCL 10 G IV SOLR
1500.0000 mg | INTRAVENOUS | Status: DC
  Filled 2015-02-21: qty 1500

## 2015-02-21 MED ORDER — SODIUM CHLORIDE 0.9 % IJ SOLN
3.0000 mL | Freq: Two times a day (BID) | INTRAMUSCULAR | Status: DC
  Administered 2015-02-21 – 2015-02-23 (×5): 3 mL via INTRAVENOUS

## 2015-02-21 MED ORDER — CLONIDINE HCL 0.1 MG PO TABS
0.2000 mg | ORAL_TABLET | Freq: Two times a day (BID) | ORAL | Status: DC
  Administered 2015-02-21 – 2015-02-24 (×6): 0.2 mg via ORAL
  Filled 2015-02-21 (×6): qty 2

## 2015-02-21 MED ORDER — VANCOMYCIN HCL 10 G IV SOLR
1500.0000 mg | Freq: Once | INTRAVENOUS | Status: AC
  Administered 2015-02-21: 1500 mg via INTRAVENOUS
  Filled 2015-02-21: qty 1500

## 2015-02-21 MED ORDER — CARVEDILOL 25 MG PO TABS
25.0000 mg | ORAL_TABLET | Freq: Two times a day (BID) | ORAL | Status: DC
  Administered 2015-02-21 – 2015-02-24 (×6): 25 mg via ORAL
  Filled 2015-02-21 (×6): qty 1

## 2015-02-21 MED ORDER — CEFTRIAXONE SODIUM IN DEXTROSE 20 MG/ML IV SOLN
1.0000 g | Freq: Once | INTRAVENOUS | Status: AC
  Administered 2015-02-21: 1 g via INTRAVENOUS
  Filled 2015-02-21: qty 50

## 2015-02-21 MED ORDER — VANCOMYCIN HCL 10 G IV SOLR
1500.0000 mg | Freq: Once | INTRAVENOUS | Status: AC
  Administered 2015-02-22: 1500 mg via INTRAVENOUS
  Filled 2015-02-21: qty 1500

## 2015-02-21 MED ORDER — BUPROPION HCL ER (XL) 150 MG PO TB24
300.0000 mg | ORAL_TABLET | Freq: Every day | ORAL | Status: DC
  Administered 2015-02-22 – 2015-02-24 (×3): 300 mg via ORAL
  Filled 2015-02-21 (×3): qty 2

## 2015-02-21 MED ORDER — INSULIN ASPART 100 UNIT/ML ~~LOC~~ SOLN: 0.0000 [IU] | Freq: Every day | SUBCUTANEOUS | Status: DC

## 2015-02-21 MED ORDER — TERAZOSIN HCL 5 MG PO CAPS
5.0000 mg | ORAL_CAPSULE | Freq: Every day | ORAL | Status: DC
  Administered 2015-02-22 – 2015-02-23 (×2): 5 mg via ORAL
  Filled 2015-02-21 (×2): qty 1

## 2015-02-21 MED ORDER — PRAVASTATIN SODIUM 20 MG PO TABS
40.0000 mg | ORAL_TABLET | Freq: Every day | ORAL | Status: DC
  Administered 2015-02-22 – 2015-02-23 (×2): 40 mg via ORAL
  Filled 2015-02-21 (×2): qty 2

## 2015-02-21 MED ORDER — SODIUM CHLORIDE 0.9 % IV BOLUS (SEPSIS)
500.0000 mL | Freq: Once | INTRAVENOUS | Status: AC
  Administered 2015-02-21: 500 mL via INTRAVENOUS

## 2015-02-21 MED ORDER — PANTOPRAZOLE SODIUM 40 MG PO TBEC
40.0000 mg | DELAYED_RELEASE_TABLET | Freq: Every day | ORAL | Status: DC
  Administered 2015-02-22 – 2015-02-24 (×3): 40 mg via ORAL
  Filled 2015-02-21 (×3): qty 1

## 2015-02-21 MED ORDER — IOHEXOL 240 MG/ML SOLN
25.0000 mL | INTRAMUSCULAR | Status: AC
  Administered 2015-02-21: 50 mL via ORAL

## 2015-02-21 MED ORDER — ARIPIPRAZOLE 10 MG PO TABS: 20.0000 mg | ORAL_TABLET | Freq: Every day | ORAL | Status: DC

## 2015-02-21 MED ORDER — LEVOFLOXACIN IN D5W 750 MG/150ML IV SOLN
750.0000 mg | INTRAVENOUS | Status: DC
  Administered 2015-02-21: 750 mg via INTRAVENOUS
  Filled 2015-02-21: qty 150

## 2015-02-21 MED ORDER — HEPARIN SODIUM (PORCINE) 5000 UNIT/ML IJ SOLN
5000.0000 [IU] | Freq: Three times a day (TID) | INTRAMUSCULAR | Status: DC
  Administered 2015-02-21 – 2015-02-24 (×8): 5000 [IU] via SUBCUTANEOUS
  Filled 2015-02-21 (×9): qty 1

## 2015-02-21 NOTE — Progress Notes (Signed)
Paged Dr. Elpidio AnisSudini and told him about the 0.05 troponin. No new orders received.

## 2015-02-21 NOTE — ED Provider Notes (Signed)
[facility] Emergency Department Provider Note  ____________________________________________  Time seen: Approximately 9:32 AM  I have reviewed the triage vital signs and the nursing notes.   HISTORY  Chief Complaint Abdominal Pain and Cough    HPI [name] is a 74 y.o. male With history of CHF, COPD, GERD, seizures, schizophrenia my history of DVT who presents for evaluation of one week of central sharp abdominal pain, intermittent nonbloody nonbilious emesis, productive cough. This has had gradual onset, has been constant for one week. No fevers. No chest pain. No diarrhea. Patient also fell and hit his head 5 days ago and is complaining of headache. No modifying factors. Current severity of symptoms is moderate. No other associated symptoms.   Past Medical History  Diagnosis Date  . CHF (congestive heart failure)   . COPD (chronic obstructive pulmonary disease)   . Hypertension   . GERD (gastroesophageal reflux disease)   . Seizure   . High cholesterol   . Schizophrenia   . Bipolar 1 disorder   . DVT (deep venous thrombosis)     There are no active problems to display for this patient.   History reviewed. No pertinent past surgical history.  Current Outpatient Rx  Name  Route  Sig  Dispense  Refill  . amLODipine (NORVASC) 5 MG tablet   Oral   Take 1 tablet by mouth daily.         . ARIPiprazole (ABILIFY) 20 MG tablet   Oral   Take 1 tablet by mouth daily.         Marland Kitchen aspirin EC 81 MG tablet   Oral   Take 1 tablet by mouth daily.         Marland Kitchen buPROPion (BUDEPRION XL) 300 MG 24 hr tablet   Oral   Take 1 tablet by mouth daily.         . carvedilol (COREG) 25 MG tablet   Oral   Take 1 tablet by mouth 2 (two) times daily.         . Cholecalciferol (VITAMIN D-1000 MAX ST) 1000 UNITS tablet   Oral   Take 1 tablet by mouth daily.         . cloNIDine (CATAPRES) 0.2 MG tablet   Oral   Take 1 tablet by mouth 2 (two) times  daily.         . clotrimazole (LOTRIMIN) 1 % cream   Topical   Apply 1 application topically 2 (two) times daily.         Marland Kitchen docusate sodium (COLACE) 100 MG capsule   Oral   Take 1 capsule by mouth 2 (two) times daily.         . furosemide (LASIX) 40 MG tablet   Oral   Take 1 tablet by mouth 2 (two) times daily.         Marland Kitchen omeprazole (PRILOSEC) 20 MG capsule   Oral   Take 1 capsule by mouth daily.         Marland Kitchen PHENobarbital (LUMINAL) 64.8 MG tablet   Oral   Take 2 tablets by mouth every other day.         . pravastatin (PRAVACHOL) 40 MG tablet   Oral   Take 1 tablet by mouth at bedtime.         Marland Kitchen terazosin (HYTRIN) 5 MG capsule   Oral   Take 1 capsule by mouth at bedtime.         Marland Kitchen  valsartan (DIOVAN) 320 MG tablet   Oral   Take 1 tablet by mouth every morning.           Allergies Review of patient's allergies indicates no known allergies.  No family history on file.  Social History History  Substance Use Topics  . Smoking status: Never Smoker   . Smokeless tobacco: Not on file  . Alcohol Use: No    Review of Systems Constitutional: No fever/chills Eyes: No visual changes. ENT: No sore throat. Cardiovascular: Denies chest pain. Respiratory: Denies shortness of breath. Gastrointestinal: +abdominal pain.  +nausea, +vomiting.  No diarrhea.  No constipation. Genitourinary: Negative for dysuria. Musculoskeletal: Negative for back pain. Skin: Negative for rash. Neurological: Negative for headaches, focal weakness or numbness.  10-point ROS otherwise negative.  ____________________________________________   PHYSICAL EXAM:  VITAL SIGNS: ED Triage Vitals  Enc Vitals Group     BP 02/21/15 0905 118/76 mmHg     Pulse Rate 02/21/15 0905 78     Resp --      Temp 02/21/15 0905 99.5 F (37.5 C)     Temp Source 02/21/15 0905 Oral     SpO2 02/21/15 0905 90 %     Weight 02/21/15 0905 319 lb (144.697 kg)     Height 02/21/15 0905 5\' 10"  (1.778 m)      Head Cir --      Peak Flow --      Pain Score 02/21/15 0907 10     Pain Loc --      Pain Edu? --      Excl. in GC? --     Constitutional: Alert and oriented 3, chronically ill-appearing, mildly diaphoretic. Eyes: Conjunctivae are normal. PERRL. EOMI. Head: multiple ecchymoses in the right face. Nose: No congestion/rhinnorhea. Mouth/Throat: Mucous membranes are dry.  Oropharynx non-erythematous. Neck: No stridor.   Cardiovascular: Normal rate, regular rhythm. Grossly normal heart sounds.  Good peripheral circulation. Respiratory: Normal respiratory effort.  No retractions. Coarse breath sounds throughout all lung fields. Gastrointestinal:severely obese body habitus limits exam however the patient does have tenderness to palpation throughout the abdomen. Genitourinary: deferred Musculoskeletal: chronic edema of bilateral lower extremities with venous stasis color change. Neurologic:  Normal speech and language. No gross focal neurologic deficits are appreciated.  Skin:  Skin is warm, dry and intact. No rash noted. Psychiatric: Mood and affect are normal. Speech and behavior are normal.  ____________________________________________   LABS (all labs ordered are listed, but only abnormal results are displayed)  Labs Reviewed  CBC WITH DIFFERENTIAL/PLATELET - Abnormal; Notable for the following:    WBC 14.0 (*)    RBC 4.26 (*)    Hemoglobin 12.2 (*)    HCT 35.7 (*)    Neutro Abs 11.9 (*)    All other components within normal limits  COMPREHENSIVE METABOLIC PANEL - Abnormal; Notable for the following:    Potassium 2.0 (*)    Chloride 92 (*)    Glucose, Bld 187 (*)    BUN 46 (*)    Creatinine, Ser 2.40 (*)    Calcium 8.4 (*)    Albumin 3.1 (*)    ALT 12 (*)    GFR calc non Af Amer 25 (*)    GFR calc Af Amer 29 (*)    Anion gap 16 (*)    All other components within normal limits  TROPONIN I - Abnormal; Notable for the following:    Troponin I 0.04 (*)    All other  components within normal  limits  LIPASE, BLOOD  URINALYSIS COMPLETEWITH MICROSCOPIC ([facility] ONLY)   ____________________________________________  EKG  ED ECG REPORT I, Gayla Doss, the attending physician, personally viewed and interpreted this ECG.   Date: 02/21/2015  EKG Time: 09:07  Rate: 77  Rhythm: normal sinus rhythm  Axis: Normal  Intervals:none  ST&T Change: nonspecific ST and T-wave abnormality, no acute ST segment elevation. ____________________________________________  RADIOLOGY Chest x-ray FINDINGS: Low lung volumes are again noted. Heart size is stable within normal limits. Both lungs are clear. No evidence of pulmonary infiltrate or pleural effusion. IMPRESSION: No active cardiopulmonary disease.   CT head/maxillofacial IMPRESSION: 1. No acute intracranial abnormality. 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 Atrophy. 2. New paranasal sinus disease with air-fluid levels in the right maxillary and sphenoid sinus suggesting acute sinusitis. 3. Large stone in Wharton's duct of the right submandibular gland. The gland appears normal.    Ct abdomen and pelvis IMPRESSION: 1. Dense opacity in the medial aspect of the right lower lobe, possibly representing infectious process. However given the rounded appearance a mass is not excluded and follow-up is recommended. Given location of this opacity it is difficult to image radiographically and follow-up CT of the chest is recommended in 3-4 weeks following trial of antibiotic therapy to ensure resolution and exclude underlying malignancy. 2. Atherosclerosis of the abdominal aorta. 3. 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   ____________________________________________   PROCEDURES  Procedure(s) performed: None  Critical Care performed: Yes, see critical care note(s). Total critical care time spent 35 minutes.  ____________________________________________   INITIAL IMPRESSION / ASSESSMENT  AND PLAN / ED COURSE  Pertinent labs & imaging results that were available during my care of the patient were reviewed by me and considered in my medical decision making (see chart for details).  [name] is a 74 y.o. male With history of CHF, COPD, GERD, seizures, schizophrenia my history of DVT who presents for evaluation of one week of central sharp abdominal pain, intermittent nonbloody nonbilious emesis, productive cough. He also reports he rolled out of bed 5 days ago and has been having headache/face pain since that time. On exam, he is chronically ill-appearing, morbidly obese, mildly diaphoretic. Mildly hypoxic with coarse breath sounds and has diffuse tenderness to palpation throughout the abdomen. He has a nonfocal neurological examination. Plan for screening labs, CT head and face, chest x-ray, urinalysis as well as likely CT of the abdomen and pelvis. Reassess for disposition.  ----------------------------------------- 1:29 PM on 02/21/2015 ----------------------------------------- CT of the abdomen and pelvis concerning for opacity in the right lower lobe which may revisit pneumonia given the patient's mild hypoxia, tachypnea, productive cough. Labs also notable for leukocytosis. We'll treat with cephalexin and azithromycin. Additionally, he is severely hypokalemic - we are currently repleting potassium. Case was discussed with hospitalist for admission.  ____________________________________________   FINAL CLINICAL IMPRESSION(S) / ED DIAGNOSES  Final diagnoses:  Generalized abdominal pain  Community acquired pneumonia  Acute hypokalemia      Gayla Doss, MD 02/21/15 [phone]

## 2015-02-21 NOTE — H&P (Signed)
[facility] - [place] at [facility]   PATIENT NAME: [name]    MR#:  [id number]  DATE OF BIRTH:  [birth year]  DATE OF ADMISSION:  02/21/2015  PRIMARY CARE PHYSICIAN: No PCP Per Patient   REQUESTING/REFERRING PHYSICIAN: Dr. Inocencio Homes.  CHIEF COMPLAINT:   Chief Complaint  Patient presents with  . Abdominal Pain  . Cough   abdominal pain and cough for 2 days.  HISTORY OF PRESENT ILLNESS:  [name]  is a 74 y.o. male with a known history of hypertension, DM, COPD and CHF. The patient is sent from facility due to abdominal pain and a cough for 2 days. He is alert awake oriented in no acute distress. He only complains of abdominal pain and a cough. The abdominal pain is in epigastric area, retained without radiation. Patient denies any fever or chills, no nausea or vomiting or diarrhea. He denies any shortness of breath or wheezing. He denies any dysuria, hematuria or incontinence. Chest x-ray didn't show any infiltrate. But the abdominal CAT scan showed right lower lobe pneumonia and right renal stone without obstruction. He was treated with the antibiotics in the ED. Patient's potassium was low at 2,  he was treated 20 Meq IV potassium in ED.   PAST MEDICAL HISTORY:   Past Medical History  Diagnosis Date  . CHF (congestive heart failure)   . COPD (chronic obstructive pulmonary disease)   . Hypertension   . GERD (gastroesophageal reflux disease)   . Seizure   . High cholesterol   . Schizophrenia   . Bipolar 1 disorder   . DVT (deep venous thrombosis)     PAST SURGICAL HISTORY:  History reviewed. No pertinent past surgical history.  SOCIAL HISTORY:   History  Substance Use Topics  . Smoking status: Never Smoker   . Smokeless tobacco: Not on file  . Alcohol Use: No    FAMILY HISTORY:  No family history on file. hypertension.  DRUG ALLERGIES:  No Known Allergies  REVIEW OF SYSTEMS:  CONSTITUTIONAL: No fever, generalized weakness.   EYES: No blurred or double vision.  EARS, NOSE, AND THROAT: No tinnitus or ear pain.  RESPIRATORY: Has cough, but denies shortness of breath, wheezing or hemoptysis.  CARDIOVASCULAR: No chest pain, orthopnea, edema.  GASTROINTESTINAL: No nausea, vomiting, diarrhea. Epigastric abdominal pain.  GENITOURINARY: No dysuria, hematuria.  ENDOCRINE: No polyuria, nocturia,  HEMATOLOGY: No anemia, easy bruising or bleeding SKIN: No rash or lesion. MUSCULOSKELETAL: No joint pain or arthritis.   NEUROLOGIC: No tingling, numbness, weakness.  PSYCHIATRY: No anxiety or depression.   MEDICATIONS AT HOME:   Prior to Admission medications   Medication Sig Start Date End Date Taking? Authorizing Provider  amLODipine (NORVASC) 5 MG tablet Take 1 tablet by mouth daily.   Yes Historical Provider, MD  ARIPiprazole (ABILIFY) 20 MG tablet Take 1 tablet by mouth daily.   Yes Historical Provider, MD  aspirin EC 81 MG tablet Take 1 tablet by mouth daily.   Yes Historical Provider, MD  buPROPion (BUDEPRION XL) 300 MG 24 hr tablet Take 1 tablet by mouth daily.   Yes Historical Provider, MD  carvedilol (COREG) 25 MG tablet Take 1 tablet by mouth 2 (two) times daily.   Yes Historical Provider, MD  Cholecalciferol (VITAMIN D-1000 MAX ST) 1000 UNITS tablet Take 1 tablet by mouth daily.   Yes Historical Provider, MD  cloNIDine (CATAPRES) 0.2 MG tablet Take 1 tablet by mouth 2 (two) times daily.   Yes Historical Provider,  MD  clotrimazole (LOTRIMIN) 1 % cream Apply 1 application topically 2 (two) times daily.   Yes Historical Provider, MD  docusate sodium (COLACE) 100 MG capsule Take 1 capsule by mouth 2 (two) times daily.   Yes Historical Provider, MD  furosemide (LASIX) 40 MG tablet Take 1 tablet by mouth 2 (two) times daily.   Yes Historical Provider, MD  omeprazole (PRILOSEC) 20 MG capsule Take 1 capsule by mouth daily.   Yes Historical Provider, MD  PHENobarbital (LUMINAL) 64.8 MG tablet Take 2 tablets by mouth every  other day.   Yes Historical Provider, MD  pravastatin (PRAVACHOL) 40 MG tablet Take 1 tablet by mouth at bedtime.   Yes Historical Provider, MD  terazosin (HYTRIN) 5 MG capsule Take 1 capsule by mouth at bedtime.   Yes Historical Provider, MD  valsartan (DIOVAN) 320 MG tablet Take 1 tablet by mouth every morning.   Yes Historical Provider, MD      VITAL SIGNS:  Blood pressure 118/76, pulse 78, temperature 99.5 F (37.5 C), temperature source Oral, height  (1.778 m), weight 144.697 kg (319 lb), SpO2 90 %.  PHYSICAL EXAMINATION:  GENERAL:  74 y.o.-year-old patient lying in the bed with no acute distress. Morbidly obese EYES: Pupils equal, round, reactive to light and accommodation. No scleral icterus. Extraocular muscles intact.  HEENT: Head atraumatic, normocephalic. Oropharynx and nasopharynx clear. Dry oral mucosa. NECK:  Supple, no jugular venous distention. No thyroid enlargement, no tenderness.  LUNGS: Normal breath sounds bilaterally, no wheezing, rales,rhonchi or crepitation. No use of accessory muscles of respiration.  CARDIOVASCULAR: S1, S2 normal. Systolic murmurs 3/6, no rubs, or gallops.  ABDOMEN: Soft, nontender, nondistended. Bowel sounds present. No organomegaly or mass.  EXTREMITIES: No pedal edema, cyanosis, or clubbing. Chronic skin changes on bilateral lower extremities under her knees. NEUROLOGIC: Cranial nerves II through XII are intact. Muscle strength 4/5 in all extremities. Sensation intact. Gait not checked.  PSYCHIATRIC: The patient is alert and oriented x 3.  SKIN: No obvious rash, lesion, or ulcer.   LABORATORY PANEL:   CBC  Recent Labs Lab 02/21/15 0917  WBC 14.0*  HGB 12.2*  HCT 35.7*  PLT 212   ------------------------------------------------------------------------------------------------------------------  Chemistries   Recent Labs Lab 02/21/15 0917  NA 140  K 2.0*  CL 92*  CO2 32  GLUCOSE 187*  BUN 46*  CREATININE 2.40*  CALCIUM  8.4*  AST 20  ALT 12*  ALKPHOS 83  BILITOT 1.0   ------------------------------------------------------------------------------------------------------------------  Cardiac Enzymes  Recent Labs Lab 02/21/15 0917  TROPONINI 0.04*   ------------------------------------------------------------------------------------------------------------------  RADIOLOGY:  Ct Abdomen Pelvis Wo Contrast  02/21/2015   CLINICAL DATA:  Central abdominal pain for 1 week. Productive cough since Sunday. Rolled out of bed on Sunday. Bruising of the right side of the face.  EXAM: CT ABDOMEN AND PELVIS WITHOUT CONTRAST  TECHNIQUE: Multidetector CT imaging of the abdomen and pelvis was performed following the standard protocol without IV contrast.  COMPARISON:  CT of the abdomen and pelvis on 10/02/2012  FINDINGS: Lower chest: There is dense opacity in the medial right lower lobe, somewhat rounded and masslike in appearance, measuring 4.6 centimeters but only partially imaged. Minimal atelectasis identified in the left lower lobe.  Upper abdomen: No focal abnormality identified within the liver, spleen, pancreas, or adrenal glands. A 4.5 centimeter upper pole cyst is identified in the right kidney. There is no hydronephrosis. Intrarenal calculi are identified in the right kidney. No ureteral obstruction. Largest calculi in the lower  pole region on the right measure 1.3 centimeters.  Gastrointestinal tract: The stomach, small bowel loops have a normal appearance. The appendix is well seen and has a normal appearance. Colonic loops are normal in appearance.  Pelvis: Urinary bladder, prostate gland, and seminal vesicles have a normal appearance. No free pelvic fluid.  Retroperitoneum: No retroperitoneal or mesenteric adenopathy. There is atherosclerosis of the abdominal aorta. No aneurysm.  Abdominal wall: Unremarkable.  Osseous structures: Significant degenerative changes in the lower thoracic and lumbar spine. No suspicious  lytic or blastic lesions are identified.  IMPRESSION: 1. Dense opacity in the medial aspect of the right lower lobe, possibly representing infectious process. However given the rounded appearance a mass is not excluded and follow-up is recommended. Given location of this opacity it is difficult to image radiographically and follow-up CT of the chest is recommended in 3-4 weeks following trial of antibiotic therapy to ensure resolution and exclude underlying malignancy. 2. Atherosclerosis of the abdominal aorta. 3. 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   Electronically Signed   By: Norva PavlovElizabeth  Brown M.D.   On: 02/21/2015 13:15   Dg Chest 2 View  02/21/2015   CLINICAL DATA:  Productive cough and shortness of breath for 1 week. Nausea and vomiting for 1 day. Chest pain.  EXAM: CHEST  2 VIEW  COMPARISON:  10/02/2012  FINDINGS: Low lung volumes are again noted. Heart size is stable within normal limits. Both lungs are clear. No evidence of pulmonary infiltrate or pleural effusion.  IMPRESSION: No active cardiopulmonary disease.   Electronically Signed   By: Myles RosenthalJohn  Stahl M.D.   On: 02/21/2015 09:50   Ct Head Wo Contrast  02/21/2015   CLINICAL DATA:  Head and face trauma secondary to falling out of a bed. Bruising to the right side of the face.  EXAM: CT HEAD WITHOUT CONTRAST  CT MAXILLOFACIAL WITHOUT CONTRAST  TECHNIQUE: Multidetector CT imaging of the head and maxillofacial structures were performed using the standard protocol without intravenous contrast. Multiplanar CT image reconstructions of the maxillofacial structures were also generated.  COMPARISON:  None.  CT scan of the head dated 10/02/2012  FINDINGS: CT HEAD FINDINGS  There is no acute intracranial hemorrhage, acute infarction, or intracranial mass lesion. There is severe atrophy of the cerebellum. There is moderate atrophy of the cerebral cortex with secondary slight prominence of the ventricles, stable. Multiple small lacunar infarcts in the  basal ganglia bilaterally. Small infarcts in the centrum semiovale on the right and left. There is a new air-fluid level in the right maxillary sinus and in the sphenoid sinus. There is new mucosal thickening in the ethmoid air cells and in the right side of the frontal sinus. There is chronic slight nasal septal deviation from left-to-right.  CT MAXILLOFACIAL FINDINGS  There is no fracture. No appreciable soft tissue edema. The patient is edentulous. There are air-fluid levels in the sphenoid and right maxillary sinuses with mucosal thickening in the frontal and ethmoid sinuses. Slight nasal septal deviation from right to left. Orbits are normal. Visualized intracranial structures demonstrate atrophy.  There is a 12 x 6 mm stone in a in the right submandibular region consistent with a stone in the distal right submandibular duct(Wharton's duct). The submandibular glands are not enlarged.  IMPRESSION: 1. No acute intracranial abnormality. 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 Atrophy. 2. New paranasal sinus disease with air-fluid levels in the right maxillary and sphenoid sinus suggesting acute sinusitis. 3. Large stone in Wharton's duct of the right submandibular  gland. The gland appears normal.   Electronically Signed   By: Francene Boyers M.D.   On: 02/21/2015 11:20   Ct Maxillofacial Wo Cm  02/21/2015   CLINICAL DATA:  Head and face trauma secondary to falling out of a bed. Bruising to the right side of the face.  EXAM: CT HEAD WITHOUT CONTRAST  CT MAXILLOFACIAL WITHOUT CONTRAST  TECHNIQUE: Multidetector CT imaging of the head and maxillofacial structures were performed using the standard protocol without intravenous contrast. Multiplanar CT image reconstructions of the maxillofacial structures were also generated.  COMPARISON:  None.  CT scan of the head dated 10/02/2012  FINDINGS: CT HEAD FINDINGS  There is no acute intracranial hemorrhage, acute infarction, or intracranial mass lesion. There is severe  atrophy of the cerebellum. There is moderate atrophy of the cerebral cortex with secondary slight prominence of the ventricles, stable. Multiple small lacunar infarcts in the basal ganglia bilaterally. Small infarcts in the centrum semiovale on the right and left. There is a new air-fluid level in the right maxillary sinus and in the sphenoid sinus. There is new mucosal thickening in the ethmoid air cells and in the right side of the frontal sinus. There is chronic slight nasal septal deviation from left-to-right.  CT MAXILLOFACIAL FINDINGS  There is no fracture. No appreciable soft tissue edema. The patient is edentulous. There are air-fluid levels in the sphenoid and right maxillary sinuses with mucosal thickening in the frontal and ethmoid sinuses. Slight nasal septal deviation from right to left. Orbits are normal. Visualized intracranial structures demonstrate atrophy.  There is a 12 x 6 mm stone in a in the right submandibular region consistent with a stone in the distal right submandibular duct(Wharton's duct). The submandibular glands are not enlarged.  IMPRESSION: 1. No acute intracranial abnormality. 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 Atrophy. 2. New paranasal sinus disease with air-fluid levels in the right maxillary and sphenoid sinus suggesting acute sinusitis. 3. Large stone in Wharton's duct of the right submandibular gland. The gland appears normal.   Electronically Signed   By: Francene Boyers M.D.   On: 02/21/2015 11:20    EKG:   Orders placed or performed during the hospital encounter of 02/21/15  . ED EKG  . ED EKG    IMPRESSION AND PLAN:   Right lower lobe pneumonia, HAP Leukocytosis Acute renal failure Severe hypokalemia Elevated troponin due to demanding ischemia Right nephrolithiasis without obstruction History of the hypertension, CHF and seizure Morbid obesity.  The patient will be admitted to medical floor. He was treated with Zithromax and Rocephin in ED. Since  the patient the came from facility, he possibly has HAP. I will start Levaquin and vancomycin per pharmacy. Lab CBC, sputum culture and blood culture. The patient has a history of UTI and pyelonephritis, I will check urinalysis.  For hypokalemia, I will give potassium supplement both IV and by mouth and check magnesium level. For acute renal failure I will start normal saline IV 75 cc per hour and follow-up BMP. Hold diovan and  Lasix. For elevated troponin which is possibly due to demanding ischemia,  I will check troponin level, continue aspirin and Pravachol. Hold Lasix and and continue hypertension medication except diovan. For diabetes, I will start a sliding scale.  All the records are reviewed and case discussed with ED provider. Management plans discussed with the patient, family and they are in agreement.  CODE STATUS: Full code  TOTAL TIME TAKING CARE OF THIS PATIENT:  62 minutes.  Shaune Pollack M.D on 02/21/2015 at 1:51 PM  Between 7am to 6pm - Pager - [phone]  After 6pm go to [email or web address] - password [email or web address] [facility]  [place] [facility]  Office  [phone]  CC: Primary care physician; No PCP Per Patient

## 2015-02-21 NOTE — Consult Note (Signed)
ANTIBIOTIC CONSULT NOTE - INITIAL  Pharmacy Consult for Vancomycin and Levaquin Indication: pneumonia  No Known Allergies  Patient Measurements: Height: 5\' 10"  (177.8 cm) Weight: (!) 319 lb (144.697 kg) IBW/kg (Calculated) : 73 Adjusted Body Weight: 101.68kg  Vital Signs: Temp: 98.1 F (36.7 C) (07/15 1537) Temp Source: Oral (07/15 1537) BP: 116/56 mmHg (07/15 1537) Pulse Rate: 75 (07/15 1537) Intake/Output from previous day:   Intake/Output from this shift:    Labs:  Recent Labs  02/21/15 0917  WBC 14.0*  HGB 12.2*  PLT 212  CREATININE 2.40*   Estimated Creatinine Clearance: 40 mL/min (by C-G formula based on Cr of 2.4).   Ke: 0.038, t-1/2: 18.24 hrs, Vd: 71.176.    Microbiology: No results found for this or any previous visit (from the past 720 hour(s)).  Medical History: Past Medical History  Diagnosis Date  . CHF (congestive heart failure)   . COPD (chronic obstructive pulmonary disease)   . Hypertension   . GERD (gastroesophageal reflux disease)   . Seizure   . High cholesterol   . Schizophrenia   . Bipolar 1 disorder   . DVT (deep venous thrombosis)     Medications:  Anti-infectives    Start     Dose/Rate Route Frequency Ordered Stop   02/21/15 1815  vancomycin (VANCOCIN) 1,500 mg in sodium chloride 0.9 % 500 mL IVPB     1,500 mg 250 mL/hr over 120 Minutes Intravenous  Once 02/21/15 1744     02/21/15 1800  levofloxacin (LEVAQUIN) IVPB 750 mg     750 mg 100 mL/hr over 90 Minutes Intravenous Every 48 hours 02/21/15 1747     02/21/15 1330  cefTRIAXone (ROCEPHIN) 1 g in dextrose 5 % 50 mL IVPB - Premix     1 g 100 mL/hr over 30 Minutes Intravenous  Once 02/21/15 1323 02/21/15 1448   02/21/15 1330  azithromycin (ZITHROMAX) 500 mg in dextrose 5 % 250 mL IVPB  Status:  Discontinued     500 mg 250 mL/hr over 60 Minutes Intravenous  Once 02/21/15 1323 02/21/15 1748     Assessment: Patient admitted for HCAP.  Ceftriaxone 1g IV given in ED. Consults  for Vancomycin and Levaquin dosing.    Goal of Therapy:  Vancomycin trough level 15-20 mcg/ml  Plan:  Vancomycin 1500mg  IV was given at 19:00.  Will order 2nd dose on 7/16 at 01:00 for stacked dosing.  Will continue with Vancomycin 1500mg  IV Q24H beginning 7/17 at 01:00. Trough level ordered prior to 5th dose on 7/19 at 00:30.    Levaquin 750mg  IV Q48H ordered.    Stormy CardKatsoudas,Chaim Gatley K, RPh Clinical Pharmacist 02/21/2015, 7:18 PM

## 2015-02-21 NOTE — ED Notes (Signed)
Pt presents to ed via ems with reports that he has been having central abd pain x 1 week. Also reports productive cough since Sunday. Pt reports he rolled out of bed Sunday and has bruising to rt side of face.

## 2015-02-21 NOTE — Progress Notes (Addendum)
Received pt from ED with soiled depends on. Upon changing pt, [name] LlanosAllison Phillips, RN and I noticed red, blanchable skin on the pts sacrum. When cleaned him up and applied barrier cream. Pt also has a bruise on his right eye which he states came from a recent fall.

## 2015-02-21 NOTE — ED Notes (Signed)
Pt sent from [facility].  Pt a&o.  C/o cough for several days.  Lungs diminished in lower lobes.  No resp distress

## 2015-02-21 NOTE — ED Notes (Signed)
[facility]

## 2015-02-21 NOTE — ED Notes (Signed)
Pt completed contrast, tolerated well.

## 2015-02-22 LAB — BASIC METABOLIC PANEL
Anion gap: 12 (ref 5–15)
BUN: 37 mg/dL — ABNORMAL HIGH (ref 6–20)
CO2: 33 mmol/L — ABNORMAL HIGH (ref 22–32)
CREATININE: 1.62 mg/dL — AB (ref 0.61–1.24)
Calcium: 8 mg/dL — ABNORMAL LOW (ref 8.9–10.3)
Chloride: 99 mmol/L — ABNORMAL LOW (ref 101–111)
GFR calc Af Amer: 47 mL/min — ABNORMAL LOW (ref >60)
GFR calc non Af Amer: 41 mL/min — ABNORMAL LOW (ref >60)
GLUCOSE: 145 mg/dL — AB (ref 65–99)
Potassium: 2.6 mmol/L — CL (ref 3.5–5.1)
Sodium: 144 mmol/L (ref 135–145)

## 2015-02-22 LAB — HIV ANTIBODY (ROUTINE TESTING W REFLEX): HIV Screen 4th Generation wRfx: NONREACTIVE

## 2015-02-22 LAB — GLUCOSE, CAPILLARY
GLUCOSE-CAPILLARY: 117 mg/dL — AB (ref 65–99)
GLUCOSE-CAPILLARY: 136 mg/dL — AB (ref 65–99)
Glucose-Capillary: 268 mg/dL — ABNORMAL HIGH (ref 65–99)
Glucose-Capillary: 89 mg/dL (ref 65–99)

## 2015-02-22 LAB — CBC
HEMATOCRIT: 33.9 % — AB (ref 40.0–52.0)
Hemoglobin: 11.6 g/dL — ABNORMAL LOW (ref 13.0–18.0)
MCH: 28.8 pg (ref 26.0–34.0)
MCHC: 34.3 g/dL (ref 32.0–36.0)
MCV: 84 fL (ref 80.0–100.0)
Platelets: 225 10*3/uL (ref 150–440)
RBC: 4.04 MIL/uL — ABNORMAL LOW (ref 4.40–5.90)
RDW: 13.6 % (ref 11.5–14.5)
WBC: 10.9 10*3/uL — ABNORMAL HIGH (ref 3.8–10.6)

## 2015-02-22 MED ORDER — SODIUM CHLORIDE 0.9 % IV SOLN
1250.0000 mg | Freq: Two times a day (BID) | INTRAVENOUS | Status: DC
  Administered 2015-02-22 – 2015-02-23 (×2): 1250 mg via INTRAVENOUS
  Filled 2015-02-22 (×4): qty 1250

## 2015-02-22 MED ORDER — VANCOMYCIN HCL 10 G IV SOLR
1250.0000 mg | Freq: Two times a day (BID) | INTRAVENOUS | Status: DC
  Filled 2015-02-22 (×3): qty 1250

## 2015-02-22 MED ORDER — LEVOFLOXACIN 500 MG PO TABS
750.0000 mg | ORAL_TABLET | Freq: Every day | ORAL | Status: DC
  Administered 2015-02-22 – 2015-02-23 (×2): 750 mg via ORAL
  Filled 2015-02-22 (×2): qty 2

## 2015-02-22 NOTE — Progress Notes (Signed)
[facility] - [place] at [facility]   PATIENT NAME: [name]    MR#:  [id number]  DATE OF BIRTH:  [birth year]  SUBJECTIVE:  CHIEF COMPLAINT:   Chief Complaint  Patient presents with  . Abdominal Pain  . Cough   no abdominal pain but is still have a cough.  REVIEW OF SYSTEMS:  CONSTITUTIONAL: No fever, generalized weakness.  EYES: No blurred or double vision.  EARS, NOSE, AND THROAT: No tinnitus or ear pain.  RESPIRATORY: Positive cough, no shortness of breath, wheezing or hemoptysis.  CARDIOVASCULAR: No chest pain, orthopnea, edema.  GASTROINTESTINAL: No nausea, vomiting, diarrhea or abdominal pain.  GENITOURINARY: No dysuria, hematuria.  ENDOCRINE: No polyuria, nocturia,  HEMATOLOGY: No anemia, easy bruising or bleeding SKIN: No rash or lesion. MUSCULOSKELETAL: No joint pain or arthritis.   NEUROLOGIC: No tingling, numbness, weakness.  PSYCHIATRY: No anxiety or depression.   DRUG ALLERGIES:  No Known Allergies  VITALS:  Blood pressure 134/61, pulse 84, temperature 98.8 F (37.1 C), temperature source Oral, resp. rate 18, height 5\' 10"  (1.778 m), weight 144.697 kg (319 lb), SpO2 96 %.  PHYSICAL EXAMINATION:  GENERAL:  74 y.o.-year-old patient lying in the bed with no acute distress. Obese. EYES: Pupils equal, round, reactive to light and accommodation. No scleral icterus. Extraocular muscles intact.  HEENT: Head atraumatic, normocephalic. Oropharynx and nasopharynx clear.  NECK:  Supple, no jugular venous distention. No thyroid enlargement, no tenderness.  LUNGS: Normal breath sounds bilaterally, no wheezing, rales,rhonchi or crepitation. No use of accessory muscles of respiration.  CARDIOVASCULAR: S1, S2 normal. Systolic murmurs 3/6, no rubs, or gallops.  ABDOMEN: Soft, nontender, nondistended. Bowel sounds present. No organomegaly or mass.  EXTREMITIES: No pedal edema, cyanosis, or clubbing. Chronic skin changes on bilateral lower  extremities. NEUROLOGIC: Cranial nerves II through XII are intact. Muscle strength 4/5 in all extremities. Sensation intact. Gait not checked.  PSYCHIATRIC: The patient is alert and oriented x 3.  SKIN: No obvious rash, lesion, or ulcer.    LABORATORY PANEL:   CBC  Recent Labs Lab 02/22/15 0815  WBC 10.9*  HGB 11.6*  HCT 33.9*  PLT 225   ------------------------------------------------------------------------------------------------------------------  Chemistries   Recent Labs Lab 02/21/15 0917 02/21/15 1735 02/22/15 0815  NA 140  --  144  K 2.0*  --  2.6*  CL 92*  --  99*  CO2 32  --  33*  GLUCOSE 187*  --  145*  BUN 46*  --  37*  CREATININE 2.40*  --  1.62*  CALCIUM 8.4*  --  8.0*  MG  --  2.1  --   AST 20  --   --   ALT 12*  --   --   ALKPHOS 83  --   --   BILITOT 1.0  --   --    ------------------------------------------------------------------------------------------------------------------  Cardiac Enzymes  Recent Labs Lab 02/21/15 1737  TROPONINI 0.05*   ------------------------------------------------------------------------------------------------------------------  RADIOLOGY:  Ct Abdomen Pelvis Wo Contrast  02/21/2015   CLINICAL DATA:  Central abdominal pain for 1 week. Productive cough since Sunday. Rolled out of bed on Sunday. Bruising of the right side of the face.  EXAM: CT ABDOMEN AND PELVIS WITHOUT CONTRAST  TECHNIQUE: Multidetector CT imaging of the abdomen and pelvis was performed following the standard protocol without IV contrast.  COMPARISON:  CT of the abdomen and pelvis on 10/02/2012  FINDINGS: Lower chest: There is dense opacity in the medial right lower lobe, somewhat rounded and masslike  in appearance, measuring 4.6 centimeters but only partially imaged. Minimal atelectasis identified in the left lower lobe.  Upper abdomen: No focal abnormality identified within the liver, spleen, pancreas, or adrenal glands. A 4.5 centimeter upper pole  cyst is identified in the right kidney. There is no hydronephrosis. Intrarenal calculi are identified in the right kidney. No ureteral obstruction. Largest calculi in the lower pole region on the right measure 1.3 centimeters.  Gastrointestinal tract: The stomach, small bowel loops have a normal appearance. The appendix is well seen and has a normal appearance. Colonic loops are normal in appearance.  Pelvis: Urinary bladder, prostate gland, and seminal vesicles have a normal appearance. No free pelvic fluid.  Retroperitoneum: No retroperitoneal or mesenteric adenopathy. There is atherosclerosis of the abdominal aorta. No aneurysm.  Abdominal wall: Unremarkable.  Osseous structures: Significant degenerative changes in the lower thoracic and lumbar spine. No suspicious lytic or blastic lesions are identified.  IMPRESSION: 1. Dense opacity in the medial aspect of the right lower lobe, possibly representing infectious process. However given the rounded appearance a mass is not excluded and follow-up is recommended. Given location of this opacity it is difficult to image radiographically and follow-up CT of the chest is recommended in 3-4 weeks following trial of antibiotic therapy to ensure resolution and exclude underlying malignancy. 2. Atherosclerosis of the abdominal aorta. 3. 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   Electronically Signed   By: Norva Pavlov M.D.   On: 02/21/2015 13:15   Dg Chest 2 View  02/21/2015   CLINICAL DATA:  Productive cough and shortness of breath for 1 week. Nausea and vomiting for 1 day. Chest pain.  EXAM: CHEST  2 VIEW  COMPARISON:  10/02/2012  FINDINGS: Low lung volumes are again noted. Heart size is stable within normal limits. Both lungs are clear. No evidence of pulmonary infiltrate or pleural effusion.  IMPRESSION: No active cardiopulmonary disease.   Electronically Signed   By: Myles Rosenthal M.D.   On: 02/21/2015 09:50   Ct Head Wo Contrast  02/21/2015   CLINICAL  DATA:  Head and face trauma secondary to falling out of a bed. Bruising to the right side of the face.  EXAM: CT HEAD WITHOUT CONTRAST  CT MAXILLOFACIAL WITHOUT CONTRAST  TECHNIQUE: Multidetector CT imaging of the head and maxillofacial structures were performed using the standard protocol without intravenous contrast. Multiplanar CT image reconstructions of the maxillofacial structures were also generated.  COMPARISON:  None.  CT scan of the head dated 10/02/2012  FINDINGS: CT HEAD FINDINGS  There is no acute intracranial hemorrhage, acute infarction, or intracranial mass lesion. There is severe atrophy of the cerebellum. There is moderate atrophy of the cerebral cortex with secondary slight prominence of the ventricles, stable. Multiple small lacunar infarcts in the basal ganglia bilaterally. Small infarcts in the centrum semiovale on the right and left. There is a new air-fluid level in the right maxillary sinus and in the sphenoid sinus. There is new mucosal thickening in the ethmoid air cells and in the right side of the frontal sinus. There is chronic slight nasal septal deviation from left-to-right.  CT MAXILLOFACIAL FINDINGS  There is no fracture. No appreciable soft tissue edema. The patient is edentulous. There are air-fluid levels in the sphenoid and right maxillary sinuses with mucosal thickening in the frontal and ethmoid sinuses. Slight nasal septal deviation from right to left. Orbits are normal. Visualized intracranial structures demonstrate atrophy.  There is a 12 x 6 mm stone in a in  the right submandibular region consistent with a stone in the distal right submandibular duct(Wharton's duct). The submandibular glands are not enlarged.  IMPRESSION: 1. No acute intracranial abnormality. 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 Atrophy. 2. New paranasal sinus disease with air-fluid levels in the right maxillary and sphenoid sinus suggesting acute sinusitis. 3. Large stone in Wharton's duct of the  right submandibular gland. The gland appears normal.   Electronically Signed   By: Francene Boyers M.D.   On: 02/21/2015 11:20   Ct Maxillofacial Wo Cm  02/21/2015   CLINICAL DATA:  Head and face trauma secondary to falling out of a bed. Bruising to the right side of the face.  EXAM: CT HEAD WITHOUT CONTRAST  CT MAXILLOFACIAL WITHOUT CONTRAST  TECHNIQUE: Multidetector CT imaging of the head and maxillofacial structures were performed using the standard protocol without intravenous contrast. Multiplanar CT image reconstructions of the maxillofacial structures were also generated.  COMPARISON:  None.  CT scan of the head dated 10/02/2012  FINDINGS: CT HEAD FINDINGS  There is no acute intracranial hemorrhage, acute infarction, or intracranial mass lesion. There is severe atrophy of the cerebellum. There is moderate atrophy of the cerebral cortex with secondary slight prominence of the ventricles, stable. Multiple small lacunar infarcts in the basal ganglia bilaterally. Small infarcts in the centrum semiovale on the right and left. There is a new air-fluid level in the right maxillary sinus and in the sphenoid sinus. There is new mucosal thickening in the ethmoid air cells and in the right side of the frontal sinus. There is chronic slight nasal septal deviation from left-to-right.  CT MAXILLOFACIAL FINDINGS  There is no fracture. No appreciable soft tissue edema. The patient is edentulous. There are air-fluid levels in the sphenoid and right maxillary sinuses with mucosal thickening in the frontal and ethmoid sinuses. Slight nasal septal deviation from right to left. Orbits are normal. Visualized intracranial structures demonstrate atrophy.  There is a 12 x 6 mm stone in a in the right submandibular region consistent with a stone in the distal right submandibular duct(Wharton's duct). The submandibular glands are not enlarged.  IMPRESSION: 1. No acute intracranial abnormality. Old lacunar infarcts in the basal  ganglia. Atrophy. 2. New paranasal sinus disease with air-fluid levels in the right maxillary and sphenoid sinus suggesting acute sinusitis. 3. Large stone in Wharton's duct of the right submandibular gland. The gland appears normal.   Electronically Signed   By: Francene Boyers M.D.   On: 02/21/2015 11:20    EKG:   Orders placed or performed during the hospital encounter of 02/21/15  . ED EKG  . ED EKG  . EKG 12-Lead  . EKG 12-Lead    ASSESSMENT AND PLAN:   Right lower lobe pneumonia, HAP. Continue levaquin and vancomycin per pharmacy.  Leukocytosis. Improved. Acute renal failure. Improving. Continue normal saline IV 75 cc per hour and follow-up BMP. Hold diovan and Lasix.  Severe hypokalemia. Improving, continue potassium supplement and follow-up BMP.  Elevated troponin due to demanding ischemia.  continue aspirin and Pravachol.  Right nephrolithiasis without obstruction.  Urinalysis is negative.  Hypertension, Continue home medication  Morbid obesity.  Diabetes, continue sliding scale.      All the records are reviewed and case discussed with Care Management/Social Workerr. Management plans discussed with the patient, family and they are in agreement.  CODE STATUS: Full code  TOTAL TIME TAKING CARE OF THIS PATIENT: 38 minutes.   POSSIBLE D/C IN 2-3 DAYS, DEPENDING ON CLINICAL  CONDITION.   Shaune Pollackhen, Orvetta Danielski M.D on 02/22/2015 at 2:01 PM  Between 7am to 6pm - Pager - [phone]  After 6pm go to [email or web address] - password [email or web address] [email or web address]  [facility]  Office  [phone]  CC: Primary care physician; No PCP Per Patient

## 2015-02-22 NOTE — Consult Note (Signed)
ANTIBIOTIC CONSULT NOTE - FOLLOW UP   Pharmacy Consult for Vancomycin and Levaquin Indication: pneumonia  No Known Allergies  Patient Measurements: Height:  (177.8 cm) Weight: (!) 319 lb (144.697 kg) IBW/kg (Calculated) : 73 Adjusted Body Weight: 101.68kg  Vital Signs: Temp: 98.8 F (37.1 C) (07/16 0805) Temp Source: Oral (07/16 0805) BP: 134/61 mmHg (07/16 0805) Pulse Rate: 84 (07/16 0805) Intake/Output from previous day: 07/15 0701 - 07/16 0700 In: 375 [I.V.:375] Out: 1000 [Urine:1000] Intake/Output from this shift: Total I/O In: 0  Out: 300 [Urine:300]  Labs:  Recent Labs  02/21/15 0917 02/22/15 0815  WBC 14.0* 10.9*  HGB 12.2* 11.6*  PLT 212 225  CREATININE 2.40* 1.62*   Estimated Creatinine Clearance: 59.3 mL/min (by C-G formula based on Cr of 1.62).   Ke: 0.038, t-1/2: 18.24 hrs, Vd: 71.176.    Microbiology: No results found for this or any previous visit (from the past 720 hour(s)).  Medical History: Past Medical History  Diagnosis Date  . CHF (congestive heart failure)   . COPD (chronic obstructive pulmonary disease)   . Hypertension   . GERD (gastroesophageal reflux disease)   . Seizure   . High cholesterol   . Schizophrenia   . Bipolar 1 disorder   . DVT (deep venous thrombosis)     Medications:  Anti-infectives    Start     Dose/Rate Route Frequency Ordered Stop   02/23/15 0100  vancomycin (VANCOCIN) 1,500 mg in sodium chloride 0.9 % 500 mL IVPB  Status:  Discontinued     1,500 mg 250 mL/hr over 120 Minutes Intravenous Every 24 hours 02/21/15 1920 02/22/15 0911   02/22/15 1800  levofloxacin (LEVAQUIN) tablet 750 mg     750 mg Oral Daily 02/22/15 0903     02/22/15 1400  vancomycin (VANCOCIN) 1,250 mg in sodium chloride 0.9 % 250 mL IVPB     1,250 mg 166.7 mL/hr over 90 Minutes Intravenous Every 12 hours 02/22/15 0911     02/22/15 0100  vancomycin (VANCOCIN) 1,500 mg in sodium chloride 0.9 % 500 mL IVPB     1,500 mg 250 mL/hr over  120 Minutes Intravenous  Once 02/21/15 1920 02/22/15 0340   02/21/15 1815  vancomycin (VANCOCIN) 1,500 mg in sodium chloride 0.9 % 500 mL IVPB     1,500 mg 250 mL/hr over 120 Minutes Intravenous  Once 02/21/15 1744 02/21/15 2056   02/21/15 1800  levofloxacin (LEVAQUIN) IVPB 750 mg  Status:  Discontinued     750 mg 100 mL/hr over 90 Minutes Intravenous Every 48 hours 02/21/15 1747 02/22/15 0903   02/21/15 1330  cefTRIAXone (ROCEPHIN) 1 g in dextrose 5 % 50 mL IVPB - Premix     1 g 100 mL/hr over 30 Minutes Intravenous  Once 02/21/15 1323 02/21/15 1448   02/21/15 1330  azithromycin (ZITHROMAX) 500 mg in dextrose 5 % 250 mL IVPB  Status:  Discontinued     500 mg 250 mL/hr over 60 Minutes Intravenous  Once 02/21/15 1323 02/21/15 1748     Assessment: Patient admitted for HCAP. Scr has improved.  New PK parameters: Ke= 0.054; t1/2: 12 hours; VD: 70.3 L   Goal of Therapy:  Vancomycin trough level 15-20 mcg/ml  Plan:  Will change to Vancomycin 1250 mg IV q12 hours based on kinetics and improvement in renal function.   Will start Vancomycin 1250 mg IV q12 hours. Will order vancomycin trough level prior to the 04:00 dose on 7/18.   Patient qualifies for  q24 hour dosing of levofloxacin and iv to po transition. Will change to levofloxacin 750 mg PO q24 hours.   Demetrius CharityJames,Brittni Hult D, RPh Clinical Pharmacist 02/22/2015, 7:18 PM

## 2015-02-23 LAB — GLUCOSE, CAPILLARY
GLUCOSE-CAPILLARY: 117 mg/dL — AB (ref 65–99)
GLUCOSE-CAPILLARY: 216 mg/dL — AB (ref 65–99)
GLUCOSE-CAPILLARY: 190 mg/dL — AB (ref 65–99)
GLUCOSE-CAPILLARY: 106 mg/dL — AB (ref 65–99)

## 2015-02-23 LAB — BASIC METABOLIC PANEL
ANION GAP: 10 (ref 5–15)
BUN: 25 mg/dL — ABNORMAL HIGH (ref 6–20)
CO2: 31 mmol/L (ref 22–32)
Calcium: 8.1 mg/dL — ABNORMAL LOW (ref 8.9–10.3)
Chloride: 102 mmol/L (ref 101–111)
Creatinine, Ser: 1.25 mg/dL — ABNORMAL HIGH (ref 0.61–1.24)
GFR calc Af Amer: >60 mL/min (ref >60)
GFR, EST NON AFRICAN AMERICAN: 56 mL/min — AB (ref >60)
Glucose, Bld: 124 mg/dL — ABNORMAL HIGH (ref 65–99)
POTASSIUM: 2.7 mmol/L — AB (ref 3.5–5.1)
SODIUM: 143 mmol/L (ref 135–145)

## 2015-02-23 LAB — MAGNESIUM: Magnesium: 2.1 mg/dL (ref 1.7–2.4)

## 2015-02-23 MED ORDER — POTASSIUM CHLORIDE CRYS ER 20 MEQ PO TBCR
40.0000 meq | EXTENDED_RELEASE_TABLET | Freq: Two times a day (BID) | ORAL | Status: DC
  Administered 2015-02-23 – 2015-02-24 (×3): 40 meq via ORAL
  Filled 2015-02-23 (×3): qty 2

## 2015-02-23 NOTE — Progress Notes (Signed)
Called Dr. Sheryle Hailiamond @ [phone] and he reordered po potassium meds and ordered magnesium levels to be drawn by lab. Lennon AlstromClay, Ginamarie Banfield N  02/23/2015  6:01 AM

## 2015-02-23 NOTE — Evaluation (Signed)
Physical Therapy Evaluation Patient Details Name: [name] MRN: [id number] DOB: 1941-04-06 Today's Date: 02/23/2015   History of Present Illness  Pt here with PNA, on 2 liters O2  Clinical Impression  Pt shows little tolerance and becomes fatigue with limited activity.  He is able to do some scooting in bed and light bed exercises apart from the exam but generally is limited and does not appear to be close to his baseline.     Follow Up Recommendations SNF (pt would like to go back to ALF)    Equipment Recommendations   (apparently he has a standard walker, may benefit from RW)    Recommendations for Other Services       Precautions / Restrictions Precautions Precautions: Fall Restrictions Weight Bearing Restrictions: No      Mobility  Bed Mobility Overal bed mobility: Needs Assistance Bed Mobility: Supine to Sit;Sit to Supine     Supine to sit: Mod assist Sit to supine: Mod assist   General bed mobility comments: PT showsa good effort, needs assist with upper body and LEs to get to/from EOB  Transfers Overall transfer level: Needs assistance Equipment used: Rolling walker (2 wheeled) Transfers: Sit to/from Stand Sit to Stand: Mod assist;Max assist            Ambulation/Gait Ambulation/Gait assistance: Mod assist           General Gait Details: Pt able to take 2 small side steps along EOB, and then needs to sit quickly  Stairs            Wheelchair Mobility    Modified Rankin (Stroke Patients Only)       Balance                                             Pertinent Vitals/Pain Pain Assessment: No/denies pain    Home Living Family/patient expects to be discharged to:: Assisted living (unsure if PT is available at his ALF, may need more care)               Home Equipment: Walker - 2 wheels      Prior Function Level of Independence:  (reports that he is able to walk w/o issue in his ALF)                Hand Dominance        Extremity/Trunk Assessment   Upper Extremity Assessment: Generalized weakness           Lower Extremity Assessment: Generalized weakness         Communication   Communication: No difficulties  Cognition Arousal/Alertness: Awake/alert Behavior During Therapy: WFL for tasks assessed/performed Overall Cognitive Status: Within Functional Limits for tasks assessed                      General Comments      Exercises General Exercises - Lower Extremity Ankle Circles/Pumps: AROM;10 reps Heel Slides: AROM;10 reps Hip ABduction/ADduction: AROM;10 reps      Assessment/Plan    PT Assessment Patient needs continued PT services  PT Diagnosis Difficulty walking;Generalized weakness   PT Problem List Decreased strength;Decreased activity tolerance;Decreased balance;Decreased mobility;Decreased safety awareness  PT Treatment Interventions Gait training;Functional mobility training;Therapeutic activities;Balance training;Therapeutic exercise;Neuromuscular re-education   PT Goals (Current goals can be found in the Care Plan section) Acute Rehab PT Goals Patient  Stated Goal: "I'd like to go back to my home" PT Goal Formulation: With patient Time For Goal Achievement: 03/09/15    Frequency Min 2X/week   Barriers to discharge   `    Co-evaluation               End of Session Equipment Utilized During Treatment: Gait belt Activity Tolerance: Patient limited by fatigue Patient left: with bed alarm set           Time: 0925-0958 PT Time Calculation (min) (ACUTE ONLY): 33 min   Charges:   PT Evaluation $Initial PT Evaluation Tier I: 1 Procedure PT Treatments $Therapeutic Exercise: 8-22 mins   PT G Codes:       Loran SentersGalen [name], PT, DPT [phone]  Malachi ProGalen R Suzana Sohail 02/23/2015, 1:34 PM

## 2015-02-23 NOTE — Progress Notes (Signed)
Patient alert and oriented.  VSS.  No complaints of pain.  Patient still has a congested nonproductive cough.  Lungs are diminished with some expiratory wheezing.  Patient tolerating his diet well.  PT worked with patient today and was unable to get out of bed.  No significant changes.  Continue to monitor.

## 2015-02-23 NOTE — Progress Notes (Signed)
[facility] - [place] at [facility]   PATIENT NAME: [name]    MR#:  [id number]  DATE OF BIRTH:  08-14-1940  SUBJECTIVE:  CHIEF COMPLAINT:   Chief Complaint  Patient presents with  . Abdominal Pain  . Cough  still has cough.  REVIEW OF SYSTEMS:  CONSTITUTIONAL: No fever, generalized weakness.  EYES: No blurred or double vision.  EARS, NOSE, AND THROAT: No tinnitus or ear pain.  RESPIRATORY: Positive cough, no shortness of breath, wheezing or hemoptysis.  CARDIOVASCULAR: No chest pain, orthopnea, edema.  GASTROINTESTINAL: No nausea, vomiting, diarrhea or abdominal pain.  GENITOURINARY: No dysuria, hematuria.  ENDOCRINE: No polyuria, nocturia,  HEMATOLOGY: No anemia, easy bruising or bleeding SKIN: No rash or lesion. MUSCULOSKELETAL: No joint pain or arthritis.   NEUROLOGIC: No tingling, numbness, weakness.  PSYCHIATRY: No anxiety or depression.   DRUG ALLERGIES:  No Known Allergies  VITALS:  Blood pressure 141/64, pulse 79, temperature 98.3 F (36.8 C), temperature source Oral, resp. rate 19, height  (1.778 m), weight 144.697 kg (319 lb), SpO2 96 %.  PHYSICAL EXAMINATION:  GENERAL:  74 y.o.-year-old patient lying in the bed with no acute distress. Obese. EYES: Pupils equal, round, reactive to light and accommodation. No scleral icterus. Extraocular muscles intact.  HEENT: Head atraumatic, normocephalic. Oropharynx and nasopharynx clear.  NECK:  Supple, no jugular venous distention. No thyroid enlargement, no tenderness.  LUNGS: Normal breath sounds bilaterally, no wheezing, rales,rhonchi or crepitation. No use of accessory muscles of respiration.  CARDIOVASCULAR: S1, S2 normal. Systolic murmurs 3/6, no rubs, or gallops.  ABDOMEN: Soft, nontender, nondistended. Bowel sounds present. No organomegaly or mass.  EXTREMITIES: No pedal edema, cyanosis, or clubbing. Chronic skin changes on bilateral lower extremities. NEUROLOGIC: Cranial  nerves II through XII are intact. Muscle strength 4/5 in all extremities. Sensation intact. Gait not checked.  PSYCHIATRIC: The patient is alert and oriented x 3.  SKIN: No obvious rash, lesion, or ulcer.    LABORATORY PANEL:   CBC  Recent Labs Lab 02/22/15 0815  WBC 10.9*  HGB 11.6*  HCT 33.9*  PLT 225   ------------------------------------------------------------------------------------------------------------------  Chemistries   Recent Labs Lab 02/21/15 0917  02/23/15 0409  NA 140  < > 143  K 2.0*  < > 2.7*  CL 92*  < > 102  CO2 32  < > 31  GLUCOSE 187*  < > 124*  BUN 46*  < > 25*  CREATININE 2.40*  < > 1.25*  CALCIUM 8.4*  < > 8.1*  MG  --   < > 2.1  AST 20  --   --   ALT 12*  --   --   ALKPHOS 83  --   --   BILITOT 1.0  --   --   < > = values in this interval not displayed. ------------------------------------------------------------------------------------------------------------------  Cardiac Enzymes  Recent Labs Lab 02/21/15 1737  TROPONINI 0.05*   ------------------------------------------------------------------------------------------------------------------  RADIOLOGY:  Ct Abdomen Pelvis Wo Contrast  02/21/2015   CLINICAL DATA:  Central abdominal pain for 1 week. Productive cough since Sunday. Rolled out of bed on Sunday. Bruising of the right side of the face.  EXAM: CT ABDOMEN AND PELVIS WITHOUT CONTRAST  TECHNIQUE: Multidetector CT imaging of the abdomen and pelvis was performed following the standard protocol without IV contrast.  COMPARISON:  CT of the abdomen and pelvis on 10/02/2012  FINDINGS: Lower chest: There is dense opacity in the medial right lower lobe, somewhat rounded and masslike  in appearance, measuring 4.6 centimeters but only partially imaged. Minimal atelectasis identified in the left lower lobe.  Upper abdomen: No focal abnormality identified within the liver, spleen, pancreas, or adrenal glands. A 4.5 centimeter upper pole cyst  is identified in the right kidney. There is no hydronephrosis. Intrarenal calculi are identified in the right kidney. No ureteral obstruction. Largest calculi in the lower pole region on the right measure 1.3 centimeters.  Gastrointestinal tract: The stomach, small bowel loops have a normal appearance. The appendix is well seen and has a normal appearance. Colonic loops are normal in appearance.  Pelvis: Urinary bladder, prostate gland, and seminal vesicles have a normal appearance. No free pelvic fluid.  Retroperitoneum: No retroperitoneal or mesenteric adenopathy. There is atherosclerosis of the abdominal aorta. No aneurysm.  Abdominal wall: Unremarkable.  Osseous structures: Significant degenerative changes in the lower thoracic and lumbar spine. No suspicious lytic or blastic lesions are identified.  IMPRESSION: 1. Dense opacity in the medial aspect of the right lower lobe, possibly representing infectious process. However given the rounded appearance a mass is not excluded and follow-up is recommended. Given location of this opacity it is difficult to image radiographically and follow-up CT of the chest is recommended in 3-4 weeks following trial of antibiotic therapy to ensure resolution and exclude underlying malignancy. 2. Atherosclerosis of the abdominal aorta. 3. 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   Electronically Signed   By: Norva PavlovElizabeth  Brown M.D.   On: 02/21/2015 13:15    EKG:   Orders placed or performed during the hospital encounter of 02/21/15  . ED EKG  . ED EKG  . EKG 12-Lead  . EKG 12-Lead    ASSESSMENT AND PLAN:   Right lower lobe pneumonia, HAP. Continue levaquin and discontinue vancomycin.  Leukocytosis. Improved. Acute renal failure. Improved. discontinue normal saline IV. Hold diovan and Lasix.  Severe hypokalemia. K still low at 2.7, continue potassium supplement and follow-up BMP. Mag is normal.  Elevated troponin due to demanding ischemia.  continue aspirin  and Pravachol.  Right nephrolithiasis without obstruction.  Urinalysis is negative.  Hypertension, Continue home medication  Morbid obesity.  Diabetes, continue sliding scale.      All the records are reviewed and case discussed with Care Management/Social Workerr. Management plans discussed with the patient, family and they are in agreement.  CODE STATUS: Full code  TOTAL TIME TAKING CARE OF THIS PATIENT: 36 minutes.   POSSIBLE D/C IN 1-2 DAYS, DEPENDING ON CLINICAL CONDITION.   Shaune Pollackhen, Vishal Sandlin M.D on 02/23/2015 at 11:36 AM  Between 7am to 6pm - Pager - [phone]  After 6pm go to [email or web address] - password [email or web address] [facility]  [place] [place] [facility]  Office  [phone]  CC: Primary care physician; No PCP Per Patient

## 2015-02-23 NOTE — Progress Notes (Signed)
Called Dr. Sheryle Hailiamond @ [phone] with critical potassium value of 2.7.  Doctor asked if pt was receiving potassium po and he is and will continue with po meds.  [name], [name]  02/23/2015  5:51 AM

## 2015-02-24 LAB — BASIC METABOLIC PANEL
Anion gap: 7 (ref 5–15)
BUN: 18 mg/dL (ref 6–20)
CHLORIDE: 103 mmol/L (ref 101–111)
CO2: 33 mmol/L — AB (ref 22–32)
CREATININE: 1.03 mg/dL (ref 0.61–1.24)
Calcium: 8.2 mg/dL — ABNORMAL LOW (ref 8.9–10.3)
GFR calc Af Amer: >60 mL/min (ref >60)
GFR calc non Af Amer: >60 mL/min (ref >60)
Glucose, Bld: 130 mg/dL — ABNORMAL HIGH (ref 65–99)
Potassium: 3.1 mmol/L — ABNORMAL LOW (ref 3.5–5.1)
SODIUM: 143 mmol/L (ref 135–145)

## 2015-02-24 LAB — GLUCOSE, CAPILLARY
GLUCOSE-CAPILLARY: 126 mg/dL — AB (ref 65–99)
GLUCOSE-CAPILLARY: 150 mg/dL — AB (ref 65–99)

## 2015-02-24 LAB — MRSA PCR SCREENING: MRSA BY PCR: NEGATIVE

## 2015-02-24 MED ORDER — LEVOFLOXACIN 750 MG PO TABS: 750.0000 mg | ORAL_TABLET | Freq: Every day | ORAL | Status: DC

## 2015-02-24 MED ORDER — POTASSIUM CHLORIDE CRYS ER 20 MEQ PO TBCR
20.0000 meq | EXTENDED_RELEASE_TABLET | Freq: Once | ORAL | Status: AC
  Administered 2015-02-24: 20 meq via ORAL
  Filled 2015-02-24: qty 1

## 2015-02-24 MED ORDER — POTASSIUM CHLORIDE ER 10 MEQ PO TBCR: 10.0000 meq | EXTENDED_RELEASE_TABLET | Freq: Every day | ORAL | Status: DC

## 2015-02-24 NOTE — Care Management Important Message (Signed)
Important Message  Patient Details  Name: [name] DarbyJerry L Liao MRN: [id number] Date of Birth: 1941/05/12   Medicare Important Message Given:  Yes-second notification given    Olegario MessierKathy A Allmond 02/24/2015, 10:07 AM

## 2015-02-24 NOTE — Discharge Summary (Signed)
[facility] Physicians - [place] at [facility]   PATIENT NAME: [name]    MR#:  [id number]  DATE OF BIRTH:  [birth year]  DATE OF ADMISSION:  02/21/2015 ADMITTING PHYSICIAN: Shaune Pollack, MD  DATE OF DISCHARGE: No discharge date for patient encounter.  PRIMARY CARE PHYSICIAN: No PCP Per Patient    ADMISSION DIAGNOSIS:  Acute hypokalemia [E87.6] Generalized abdominal pain [R10.84] Community acquired pneumonia [J18.9]   DISCHARGE DIAGNOSIS:  Right lower lobe pneumonia, HAP. Leukocytosis.  Acute renal failure. Severe hypokalemia. Elevated troponin due to demanding ischemia. Right nephrolithiasis without obstruction.  SECONDARY DIAGNOSIS:   Past Medical History  Diagnosis Date  . CHF (congestive heart failure)   . COPD (chronic obstructive pulmonary disease)   . Hypertension   . GERD (gastroesophageal reflux disease)   . Seizure   . High cholesterol   . Schizophrenia   . Bipolar 1 disorder   . DVT (deep venous thrombosis)     HOSPITAL COURSE:   Right lower lobe pneumonia, HAP. He has been treated with levaquin and need 5 more days po. Vancomycin was discontinued after 2 days.  Leukocytosis. Improved with antibiotics. Acute renal failure. Improved with normal saline IV. Diovan and Lasix were discontinued due to renal failure. I will resume lasix but not diovan at this time. He needs to follow up PCP to adjust HTN medication dose.  Severe hypokalemia. He has been treated with potassium supplement. The level is better. He needs continuing supplement and follow up BMP as outpatient. Mag is normal.  Elevated troponin due to demanding ischemia. treated with aspirin and Pravachol.  Right nephrolithiasis without obstruction. Urinalysis is negative.  Hypertension, controlled with home medication  Morbid obesity.  Diabetes, controlled.  DISCHARGE CONDITIONS:   Stable, discharge to ALF with HH.  CONSULTS OBTAINED:     DRUG ALLERGIES:  No Known  Allergies  DISCHARGE MEDICATIONS:   Current Discharge Medication List    START taking these medications   Details  levofloxacin (LEVAQUIN) 750 MG tablet Take 1 tablet (750 mg total) by mouth daily. Qty: 5 tablet, Refills: 0    potassium chloride (KLOR-CON 10) 10 MEQ tablet Take 1 tablet (10 mEq total) by mouth daily. Qty: 10 tablet, Refills: 0      CONTINUE these medications which have NOT CHANGED   Details  amLODipine (NORVASC) 5 MG tablet Take 1 tablet by mouth daily.    ARIPiprazole (ABILIFY) 20 MG tablet Take 1 tablet by mouth daily.    aspirin EC 81 MG tablet Take 1 tablet by mouth daily.    buPROPion (BUDEPRION XL) 300 MG 24 hr tablet Take 1 tablet by mouth daily.    carvedilol (COREG) 25 MG tablet Take 1 tablet by mouth 2 (two) times daily.    Cholecalciferol (VITAMIN D-1000 MAX ST) 1000 UNITS tablet Take 1 tablet by mouth daily.    cloNIDine (CATAPRES) 0.2 MG tablet Take 1 tablet by mouth 2 (two) times daily.    clotrimazole (LOTRIMIN) 1 % cream Apply 1 application topically 2 (two) times daily.    docusate sodium (COLACE) 100 MG capsule Take 1 capsule by mouth 2 (two) times daily.    furosemide (LASIX) 40 MG tablet Take 1 tablet by mouth 2 (two) times daily.    omeprazole (PRILOSEC) 20 MG capsule Take 1 capsule by mouth daily.    PHENobarbital (LUMINAL) 64.8 MG tablet Take 2 tablets by mouth every other day.    pravastatin (PRAVACHOL) 40 MG tablet Take 1 tablet by mouth  at bedtime.    terazosin (HYTRIN) 5 MG capsule Take 1 capsule by mouth at bedtime.      STOP taking these medications     valsartan (DIOVAN) 320 MG tablet          DISCHARGE INSTRUCTIONS:    If you experience worsening of your admission symptoms, develop shortness of breath, life threatening emergency, suicidal or homicidal thoughts you must seek medical attention immediately by calling 911 or calling your MD immediately  if symptoms less severe.  You Must read complete  instructions/literature along with all the possible adverse reactions/side effects for all the Medicines you take and that have been prescribed to you. Take any new Medicines after you have completely understood and accept all the possible adverse reactions/side effects.   Please note  You were cared for by a hospitalist during your hospital stay. If you have any questions about your discharge medications or the care you received while you were in the hospital after you are discharged, you can call the unit and asked to speak with the hospitalist on call if the hospitalist that took care of you is not available. Once you are discharged, your primary care physician will handle any further medical issues. Please note that NO REFILLS for any discharge medications will be authorized once you are discharged, as it is imperative that you return to your primary care physician (or establish a relationship with a primary care physician if you do not have one) for your aftercare needs so that they can reassess your need for medications and monitor your lab values.    Today   SUBJECTIVE   No complaint.   VITAL SIGNS:  Blood pressure 155/83, pulse 78, temperature 98.7 F (37.1 C), temperature source Oral, resp. rate 17, height 5\' 10"  (1.778 m), weight 144.697 kg (319 lb), SpO2 95 %.  I/O:   Intake/Output Summary (Last 24 hours) at 02/24/15 1032 Last data filed at 02/24/15 0745  Gross per 24 hour  Intake    680 ml  Output      0 ml  Net    680 ml    PHYSICAL EXAMINATION:  GENERAL:  74 y.o.-year-old patient lying in the bed with no acute distress. Obese. EYES: Pupils equal, round, reactive to light and accommodation. No scleral icterus. Extraocular muscles intact.  HEENT: Head atraumatic, normocephalic. Oropharynx and nasopharynx clear.  NECK:  Supple, no jugular venous distention. No thyroid enlargement, no tenderness.  LUNGS: Normal breath sounds bilaterally, no wheezing, rales,rhonchi or  crepitation. No use of accessory muscles of respiration.  CARDIOVASCULAR: S1, S2 normal. No murmurs, rubs, or gallops.  ABDOMEN: Soft, non-tender, non-distended. Bowel sounds present. No organomegaly or mass.  EXTREMITIES: No pedal edema, cyanosis, or clubbing. Chronic skin changes. NEUROLOGIC: Cranial nerves II through XII are intact. Muscle strength 5/5 in all extremities. Sensation intact. Gait not checked.  PSYCHIATRIC: The patient is alert and oriented x 3.  SKIN: No obvious rash, lesion, or ulcer.   DATA REVIEW:   CBC  Recent Labs Lab 02/22/15 0815  WBC 10.9*  HGB 11.6*  HCT 33.9*  PLT 225    Chemistries   Recent Labs Lab 02/21/15 0917  02/23/15 0409 02/24/15 0512  NA 140  < > 143 143  K 2.0*  < > 2.7* 3.1*  CL 92*  < > 102 103  CO2 32  < > 31 33*  GLUCOSE 187*  < > 124* 130*  BUN 46*  < > 25* 18  CREATININE  2.40*  < > 1.25* 1.03  CALCIUM 8.4*  < > 8.1* 8.2*  MG  --   < > 2.1  --   AST 20  --   --   --   ALT 12*  --   --   --   ALKPHOS 83  --   --   --   BILITOT 1.0  --   --   --   < > = values in this interval not displayed.  Cardiac Enzymes  Recent Labs Lab 02/21/15 1737  TROPONINI 0.05*    Microbiology Results  Results for orders placed or performed during the hospital encounter of 02/21/15  Culture, blood (routine x 2) Call MD if unable to obtain prior to antibiotics being given     Status: None (Preliminary result)   Collection Time: 02/21/15  4:59 PM  Result Value Ref Range Status   Specimen Description BLOOD RIGHT ASSIST CONTROL  Final   Special Requests   Final    BOTTLES DRAWN AEROBIC AND ANAEROBIC  6CC AER 4CC ANA   Culture NO GROWTH 3 DAYS  Final   Report Status PENDING  Incomplete  Culture, blood (routine x 2) Call MD if unable to obtain prior to antibiotics being given     Status: None (Preliminary result)   Collection Time: 02/21/15  5:37 PM  Result Value Ref Range Status   Specimen Description BLOOD LEFT HAND  Final   Special  Requests   Final    BOTTLES DRAWN AEROBIC AND ANAEROBIC  AER 5CC ANA 3CC   Culture NO GROWTH 3 DAYS  Final   Report Status PENDING  Incomplete  MRSA PCR Screening     Status: None   Collection Time: 02/24/15  8:19 AM  Result Value Ref Range Status   MRSA by PCR NEGATIVE NEGATIVE Final    Comment:        The GeneXpert MRSA Assay (FDA approved for NASAL specimens only), is one component of a comprehensive MRSA colonization surveillance program. It is not intended to diagnose MRSA infection nor to guide or monitor treatment for MRSA infections.     RADIOLOGY:  No results found.      Management plans discussed with the patient, family and they are in agreement.  CODE STATUS:     Code Status Orders        Start     Ordered   02/21/15 1537  Full code   Continuous     02/21/15 1536      TOTAL TIME TAKING CARE OF THIS PATIENT: 38 minutes.    Shaune Pollackhen, Hulbert Branscome M.D on 02/24/2015 at 10:32 AM  Between 7am to 6pm - Pager - [phone]  After 6pm go to [email or web address] - password [email or web address] [facility]  [place] [facility] [place] [facility]  Office  [phone]  CC: Primary care physician; No PCP Per Patient

## 2015-02-24 NOTE — Discharge Instructions (Signed)
Low sodium, low fat and ADA diet. °Activity as tolerated. °

## 2015-02-24 NOTE — Clinical Social Work Note (Signed)
Physician discharged patient today to return to his group home. CSW noted that physical therapy evaluation recommended STR. CS[facility] and spoke with the owner: ElmwoodQueen Love: [phone]. CSW informed Ms. Love of physical therapy recommendation and Ms. Love stated that she would like to take him back to [facility]. Patient informed rehab that he wanted to return to the group home as well. RN CM to [facility]. Ms. Sandria ManlyLove to come and pick patient up today.  York SpanielMonica Charde Macfarlane MSW,LCSW [phone]

## 2015-02-24 NOTE — Progress Notes (Signed)
Patient  for discharge today. [facility] preparing [facility]. [facility].Referal placed [facility]. [phone].

## 2015-02-24 NOTE — Progress Notes (Signed)
Iv was removed. Pt's transporter arrived to pick him up. Was taken downstairs via wheelchair by volunteer with transporter at side.

## 2015-02-25 LAB — LEGIONELLA PNEUMOPHILA SEROGP 1 UR AG: L. pneumophila Serogp 1 Ur Ag: NEGATIVE

## 2015-02-28 LAB — CULTURE, BLOOD (ROUTINE X 2)
Culture: NO GROWTH
Culture: NO GROWTH

## 2015-05-09 ENCOUNTER — Emergency Department: Payer: Medicare Other

## 2015-05-09 ENCOUNTER — Emergency Department
Admission: EM | Admit: 2015-05-09 | Discharge: 2015-05-09 | Disposition: A | Discharge status: Home / Self Care | Payer: Medicare Other | Attending: Emergency Medicine | Admitting: Emergency Medicine

## 2015-05-09 ENCOUNTER — Encounter: Payer: Self-pay | Admitting: Emergency Medicine

## 2015-05-09 DIAGNOSIS — Z7982 Long term (current) use of aspirin: Secondary | ICD-10-CM | POA: Insufficient documentation

## 2015-05-09 DIAGNOSIS — Y998 Other external cause status: Secondary | ICD-10-CM | POA: Diagnosis not present

## 2015-05-09 DIAGNOSIS — I10 Essential (primary) hypertension: Secondary | ICD-10-CM | POA: Diagnosis not present

## 2015-05-09 DIAGNOSIS — Y9389 Activity, other specified: Secondary | ICD-10-CM | POA: Insufficient documentation

## 2015-05-09 DIAGNOSIS — S8992XA Unspecified injury of left lower leg, initial encounter: Secondary | ICD-10-CM | POA: Diagnosis present

## 2015-05-09 DIAGNOSIS — W231XXA Caught, crushed, jammed, or pinched between stationary objects, initial encounter: Secondary | ICD-10-CM | POA: Insufficient documentation

## 2015-05-09 DIAGNOSIS — Y92128 Other place in nursing home as the place of occurrence of the external cause: Secondary | ICD-10-CM | POA: Insufficient documentation

## 2015-05-09 DIAGNOSIS — Z79899 Other long term (current) drug therapy: Secondary | ICD-10-CM | POA: Diagnosis not present

## 2015-05-09 DIAGNOSIS — S81812A Laceration without foreign body, left lower leg, initial encounter: Secondary | ICD-10-CM | POA: Diagnosis not present

## 2015-05-09 MED ORDER — BACITRACIN ZINC 500 UNIT/GM EX OINT
TOPICAL_OINTMENT | Freq: Two times a day (BID) | CUTANEOUS | Status: DC
  Administered 2015-05-09: 1 via TOPICAL

## 2015-05-09 MED ORDER — MORPHINE SULFATE (PF) 2 MG/ML IV SOLN
2.0000 mg | Freq: Once | INTRAVENOUS | Status: AC
  Administered 2015-05-09: 2 mg via INTRAVENOUS
  Filled 2015-05-09: qty 1

## 2015-05-09 MED ORDER — BACITRACIN ZINC 500 UNIT/GM EX OINT
TOPICAL_OINTMENT | CUTANEOUS | Status: AC
  Administered 2015-05-09: 1 via TOPICAL
  Filled 2015-05-09: qty 0.9

## 2015-05-09 MED ORDER — ONDANSETRON HCL 4 MG/2ML IJ SOLN
4.0000 mg | Freq: Once | INTRAMUSCULAR | Status: AC
  Administered 2015-05-09: 4 mg via INTRAVENOUS
  Filled 2015-05-09: qty 2

## 2015-05-09 MED ORDER — LIDOCAINE HCL (PF) 1 % IJ SOLN
20.0000 mL | Freq: Once | INTRAMUSCULAR | Status: AC
  Administered 2015-05-09: 20 mL
  Filled 2015-05-09: qty 20

## 2015-05-09 MED ORDER — CEPHALEXIN 500 MG PO CAPS: 500.0000 mg | ORAL_CAPSULE | Freq: Two times a day (BID) | ORAL | Status: AC

## 2015-05-09 MED ORDER — SODIUM CHLORIDE 0.9 % IV SOLN
1.5000 g | Freq: Once | INTRAVENOUS | Status: AC
  Administered 2015-05-09: 1.5 g via INTRAVENOUS
  Filled 2015-05-09 (×2): qty 1.5

## 2015-05-09 NOTE — ED Provider Notes (Signed)
[facility] Emergency Department Provider Note  ____________________________________________  Time seen: 12:50 AM  I have reviewed the triage vital signs and the nursing notes.   HISTORY  Chief Complaint Fall      HPI [name] is a 74 y.o. male presents with large laceration to his left lower leg which was sustained while getting out of his wheelchair and attempting to get onto his bed.. Patient states that he had a T Performed in his PCPs office last week. Patient denies any head injury no nausea or vomiting.     Past Medical History  Diagnosis Date  . CHF (congestive heart failure)   . COPD (chronic obstructive pulmonary disease)   . Hypertension   . GERD (gastroesophageal reflux disease)   . Seizure   . High cholesterol   . Schizophrenia   . Bipolar 1 disorder   . DVT (deep venous thrombosis)     Patient Active Problem List   Diagnosis Date Noted  . Pneumonia 02/21/2015  . ARF (acute renal failure) 02/21/2015  . Hypokalemia 02/21/2015  . Leukocytosis 02/21/2015    History reviewed. No pertinent past surgical history.  Current Outpatient Rx  Name  Route  Sig  Dispense  Refill  . amLODipine (NORVASC) 5 MG tablet   Oral   Take 1 tablet by mouth daily.         . ARIPiprazole (ABILIFY) 20 MG tablet   Oral   Take 1 tablet by mouth daily.         Marland Kitchen aspirin EC 81 MG tablet   Oral   Take 1 tablet by mouth daily.         Marland Kitchen buPROPion (BUDEPRION XL) 300 MG 24 hr tablet   Oral   Take 1 tablet by mouth daily.         . carvedilol (COREG) 25 MG tablet   Oral   Take 1 tablet by mouth 2 (two) times daily.         . Cholecalciferol (VITAMIN D-1000 MAX ST) 1000 UNITS tablet   Oral   Take 1 tablet by mouth daily.         . cloNIDine (CATAPRES) 0.2 MG tablet   Oral   Take 1 tablet by mouth 2 (two) times daily.         . clotrimazole (LOTRIMIN) 1 % cream   Topical   Apply 1 application topically 2 (two) times  daily.         Marland Kitchen docusate sodium (COLACE) 100 MG capsule   Oral   Take 1 capsule by mouth 2 (two) times daily.         . furosemide (LASIX) 40 MG tablet   Oral   Take 1 tablet by mouth 2 (two) times daily.         Marland Kitchen levofloxacin (LEVAQUIN) 750 MG tablet   Oral   Take 1 tablet (750 mg total) by mouth daily.   5 tablet   0   . omeprazole (PRILOSEC) 20 MG capsule   Oral   Take 1 capsule by mouth daily.         Marland Kitchen PHENobarbital (LUMINAL) 64.8 MG tablet   Oral   Take 2 tablets by mouth every other day.         . potassium chloride (KLOR-CON 10) 10 MEQ tablet   Oral   Take 1 tablet (10 mEq total) by mouth daily.   10 tablet   0   . pravastatin (  PRAVACHOL) 40 MG tablet   Oral   Take 1 tablet by mouth at bedtime.         Marland Kitchen terazosin (HYTRIN) 5 MG capsule   Oral   Take 1 capsule by mouth at bedtime.           Allergies Review of patient's allergies indicates no known allergies.  History reviewed. No pertinent family history.  Social History Social History  Substance Use Topics  . Smoking status: Never Smoker   . Smokeless tobacco: None  . Alcohol Use: No    Review of Systems  Constitutional: Negative for fever. Eyes: Negative for visual changes. ENT: Negative for sore throat. Cardiovascular: Negative for chest pain. Respiratory: Negative for shortness of breath. Gastrointestinal: Negative for abdominal pain, vomiting and diarrhea. Genitourinary: Negative for dysuria. Musculoskeletal: Negative for back pain. Skin: Negative for rash. Positive for left leg laceration Neurological: Negative for headaches, focal weakness or numbness.   10-point ROS otherwise negative.  ____________________________________________   PHYSICAL EXAM:  VITAL SIGNS: ED Triage Vitals  Enc Vitals Group     BP 05/09/15 0048 141/74 mmHg     Pulse Rate 05/09/15 0048 87     Resp 05/09/15 0048 16     Temp 05/09/15 0048 98.3 F (36.8 C)     Temp Source 05/09/15 0048  Oral     SpO2 05/09/15 0048 97 %     Weight 05/09/15 0055 296 lb (134.265 kg)     Height 05/09/15 0055  (1.778 m)     Head Cir --      Peak Flow --      Pain Score 05/09/15 0048 10     Pain Loc --      Pain Edu? --      Excl. in GC? --      Constitutional: Alert and oriented. Well appearing and in no distress. Eyes: Conjunctivae are normal. PERRL. Normal extraocular movements. ENT   Head: Normocephalic and atraumatic.   Nose: No congestion/rhinnorhea.   Mouth/Throat: Mucous membranes are moist.   Neck: No stridor. Hematological/Lymphatic/Immunilogical: No cervical lymphadenopathy. Cardiovascular: Normal rate, regular rhythm. Normal and symmetric distal pulses are present in all extremities. No murmurs, rubs, or gallops. Respiratory: Normal respiratory effort without tachypnea nor retractions. Breath sounds are clear and equal bilaterally. No wheezes/rales/rhonchi. Gastrointestinal: Soft and nontender. No distention. There is no CVA tenderness. Genitourinary: deferred Musculoskeletal: Nontender with normal range of motion in all extremities. No joint effusions.  No lower extremity tenderness nor edema. Neurologic:  Normal speech and language. No gross focal neurologic deficits are appreciated. Speech is normal.  Skin: 5cm/7cm and 3cm-deep cut noted on lower left anterior calf. Psychiatric: Mood and affect are normal. Speech and behavior are normal. Patient exhibits appropriate insight and judgment.  ____________________________________________       RADIOLOGY  DG Tibia/Fibula Left (Final result) Result time: 05/09/15 01:57:25   Final result by Rad Results In Interface (05/09/15 01:57:25)   Narrative:   CLINICAL DATA: Fall tonight. Leg got caught under wheelchair while he was trying to stand. Laceration noted about the lower anterior leg.  EXAM: LEFT TIBIA AND FIBULA - 2 VIEW  COMPARISON: None.  FINDINGS: Soft tissue laceration noted about the  anterior lateral lower leg with overlying dressing in place. No radiopaque foreign body. No associated osseous abnormality. No fracture or dislocation. Diffuse soft tissue edema of the lower extremity versus body habitus.  IMPRESSION: Soft tissue laceration of the anterior lateral lower leg. No radiopaque foreign body  or fracture.   Electronically Signed By: Rubye Oaks M.D. On: 05/09/2015 01:57      PROCEDURES  Procedure(s) performed: LACERATION REPAIR Performed by: Darci Current Authorized by: Darci Current Consent: Verbal consent obtained. Risks and benefits: risks, benefits and alternatives were discussed Consent given by: patient Patient identity confirmed: provided demographic data Prepped and Draped in normal sterile fashion Wound explored  Laceration Location: Left lower leg  Laceration Length: 5x7cm, 3cm deep  No Foreign Bodies seen or palpated  Anesthesia: local infiltration  Local anesthetic: lidocaine 1%  Anesthetic total:  15ml  Irrigation method: syringe Amount of cleaning: standard  Skin closure: 4-0 nylon  Number of sutures: 12 deep sutures and 36 skin   Technique: Simple interrupted   Patient tolerance: Patient tolerated the procedure well with no immediate complications.    ____________________________________________   INITIAL IMPRESSION / ASSESSMENT AND PLAN / ED COURSE  Pertinent labs & imaging results that were available during my care of the patient were reviewed by me and considered in my medical decision making (see chart for details).    ____________________________________________   FINAL CLINICAL IMPRESSION(S) / ED DIAGNOSES  Final diagnoses:  Leg laceration, left, initial encounter      Darci Current, MD 05/09/15 [phone]

## 2015-05-09 NOTE — ED Notes (Addendum)
Present to ED via EMS for fall. Pt states he was trying to get out of wheelchair and his leg got caught under it. 5cm/7cm and 3cm-deep cut noted on lower left anterior calf. Bleeding controlled. Pt states had Tdap at PCP office last week. Pt alerts and oriented x4 at this time. Pt from [facility].

## 2015-05-09 NOTE — Discharge Instructions (Signed)

## 2015-06-24 ENCOUNTER — Encounter: Payer: Medicare Other | Attending: Surgery | Admitting: Surgery

## 2015-06-24 DIAGNOSIS — F209 Schizophrenia, unspecified: Secondary | ICD-10-CM | POA: Insufficient documentation

## 2015-06-24 DIAGNOSIS — K219 Gastro-esophageal reflux disease without esophagitis: Secondary | ICD-10-CM | POA: Diagnosis not present

## 2015-06-24 DIAGNOSIS — I509 Heart failure, unspecified: Secondary | ICD-10-CM | POA: Insufficient documentation

## 2015-06-24 DIAGNOSIS — S81811A Laceration without foreign body, right lower leg, initial encounter: Secondary | ICD-10-CM | POA: Diagnosis not present

## 2015-06-24 DIAGNOSIS — X58XXXA Exposure to other specified factors, initial encounter: Secondary | ICD-10-CM | POA: Diagnosis not present

## 2015-06-24 DIAGNOSIS — I890 Lymphedema, not elsewhere classified: Secondary | ICD-10-CM | POA: Insufficient documentation

## 2015-06-24 DIAGNOSIS — L97222 Non-pressure chronic ulcer of left calf with fat layer exposed: Secondary | ICD-10-CM | POA: Diagnosis present

## 2015-06-24 DIAGNOSIS — G40909 Epilepsy, unspecified, not intractable, without status epilepticus: Secondary | ICD-10-CM | POA: Insufficient documentation

## 2015-06-24 DIAGNOSIS — F319 Bipolar disorder, unspecified: Secondary | ICD-10-CM | POA: Diagnosis not present

## 2015-06-24 DIAGNOSIS — J449 Chronic obstructive pulmonary disease, unspecified: Secondary | ICD-10-CM | POA: Insufficient documentation

## 2015-06-25 NOTE — Progress Notes (Signed)
KEKOA, FYOCK ([id number]) Visit Report for 06/24/2015 Allergy List Details Patient Name: [name], [name] 06/24/2015 9:45 Date of Service: AM Medical Record [id number] Number: Patient Account Number: [id number] Date of Birth/Sex: 12-Jun-1941 (74 y.o. Male) Treating RN: Huel Coventry Primary Care [facility], Other Clinician: Physician: RUPASHREE Treating Britto, Verl Blalock, Physician/Extender: Referring Physician: Armida Sans in Treatment: 0 Allergies Active Allergies penicillin Allergy Notes Electronic Signature(s) Signed: 06/24/2015 5:36:16 PM By: Elliot Gurney, RN, BSN, Kim RN, BSN Entered By: Elliot Gurney, RN, BSN, Kim on 06/24/2015 09:34:40 Folts, [name] ([id number]) -------------------------------------------------------------------------------- Arrival Information Details Patient Name: [name], [name] 06/24/2015 9:45 Date of Service: AM Medical Record [id number] Number: Patient Account Number: [id number] Date of Birth/Sex: 31-Jul-1941 (74 y.o. Male) Treating RN: Huel Coventry Primary Care [facility], Other Clinician: Physician: RUPASHREE Treating Britto, Verl Blalock, Physician/Extender: Referring Physician: Armida Sans in Treatment: 0 Visit Information Patient Arrived: Cloyde Reams Time: 09:23 Accompanied By: caregiver Transfer Assistance: None Patient Identification Verified: Yes Secondary Verification Process Yes Completed: Patient Requires Transmission- Yes Based Precautions: Transmission-Based Contact MRSA Precautions: Patient Has Alerts: Yes Patient Alerts: NOT Diabetic [facility] schizophrenia and bipolar Electronic Signature(s) Signed: 06/24/2015 5:36:16 PM By: Elliot Gurney, RN, BSN, Kim RN, BSN Entered By: Elliot Gurney, RN, BSN, Kim on 06/24/2015 10:56:11 Loria, [name] ([id number]) -------------------------------------------------------------------------------- Clinic Level of Care Assessment Details Patient Name:  [name], [name] 06/24/2015 9:45 Date of Service: AM Medical Record [id number] Number: Patient Account Number: [id number] Date of Birth/Sex: [birth year] (74 y.o. Male) Treating RN: Huel Coventry Primary Care [facility], Other Clinician: Physician: RUPASHREE Treating Britto, Verl Blalock, Physician/Extender: Referring Physician: Armida Sans in Treatment: 0 Clinic Level of Care Assessment Items TOOL 1 Quantity Score []  - Use when EandM and Procedure is performed on INITIAL visit 0 ASSESSMENTS - Nursing Assessment / Reassessment X - General Physical Exam (combine w/ comprehensive assessment (listed just 1 20 below) when performed on new pt. evals) X - Comprehensive Assessment (HX, ROS, Risk Assessments, Wounds Hx, etc.) 1 25 ASSESSMENTS - Wound and Skin Assessment / Reassessment []  - Dermatologic / Skin Assessment (not related to wound area) 0 ASSESSMENTS - Ostomy and/or Continence Assessment and Care []  - Incontinence Assessment and Management 0 []  - Ostomy Care Assessment and Management (repouching, etc.) 0 PROCESS - Coordination of Care X - Simple Patient / Family Education for ongoing care 1 15 []  - Complex (extensive) Patient / Family Education for ongoing care 0 X - Staff obtains Consents, Records, Test Results / Process Orders 1 10 []  - Staff telephones HHA, Nursing Homes / Clarify orders / etc 0 []  - Routine Transfer to another Facility (non-emergent condition) 0 []  - Routine Hospital Admission (non-emergent condition) 0 X - New Admissions / Manufacturing engineer / Ordering NPWT, Apligraf, etc. 1 15 []  - Emergency Hospital Admission (emergent condition) 0 PROCESS - Special Needs []  - Pediatric / Minor Patient Management 0 Slayton, Richerd L. ([id number]) []  - Isolation Patient Management 0 []  - Hearing / Language / Visual special needs 0 []  - Assessment of Community assistance (transportation, D/C planning, etc.) 0 []  - Additional assistance / Altered mentation  0 []  - Support Surface(s) Assessment (bed, cushion, seat, etc.) 0 INTERVENTIONS - Miscellaneous []  - External ear exam 0 []  - Patient Transfer (multiple staff / Nurse, adult / Similar devices) 0 []  - Simple Staple / Suture removal (25 or less) 0 []  - Complex Staple / Suture removal (26 or more) 0 []  - Hypo/Hyperglycemic Management (do not check if billed separately) 0 []  - Ankle / Brachial  Index (ABI) - do not check if billed separately 0 Has the patient been seen at the hospital within the last three years: Yes Total Score: 85 Level Of Care: New/Established - Level 3 Electronic Signature(s) Signed: 06/24/2015 5:36:16 PM By: Elliot Gurney, RN, BSN, Kim RN, BSN Entered By: Elliot Gurney, RN, BSN, Kim on 06/24/2015 10:17:14 Mccannon, [name] ([id number]) -------------------------------------------------------------------------------- Encounter Discharge Information Details Patient Name: [name], [name] 06/24/2015 9:45 Date of Service: AM Medical Record [id number] Number: Patient Account Number: [id number] Date of Birth/Sex: 10/01/1940 (74 y.o. Male) Treating RN: Huel Coventry Primary Care [facility], Other Clinician: Physician: RUPASHREE Treating Britto, Verl Blalock, Physician/Extender: Referring Physician: Armida Sans in Treatment: 0 Encounter Discharge Information Items Discharge Pain Level: 0 Discharge Condition: Stable Ambulatory Status: Walker Discharge Destination: Nursing Home Transportation: Private Auto caregiver, Accompanied By: Leanne Chang Schedule Follow-up Appointment: Yes Medication Reconciliation completed and provided to Patient/Care Yes Abshir Paolini: Provided on Clinical Summary of Care: 06/24/2015 Form Type Recipient Paper Patient JC Electronic Signature(s) Signed: 06/24/2015 10:22:41 AM By: Gwenlyn Perking Entered By: Gwenlyn Perking on 06/24/2015 10:22:41 Latka, [name]  ([id number]) -------------------------------------------------------------------------------- Lower Extremity Assessment Details Patient Name: [name], [name] 06/24/2015 9:45 Date of Service: AM Medical Record [id number] Number: Patient Account Number: [id number] Date of Birth/Sex: 1940/12/04 (74 y.o. Male) Treating RN: Huel Coventry Primary Care [facility], Other Clinician: Physician: RUPASHREE Treating Britto, Verl Blalock, Physician/Extender: Referring Physician: Armida Sans in Treatment: 0 Edema Assessment Assessed: [Left: No] [Right: No] E[Left: dema] [Right: :] Calf Left: Right: Point of Measurement: 33 cm From Medial Instep 57 cm cm Ankle Left: Right: Point of Measurement: 11 cm From Medial Instep 36 cm cm Vascular Assessment Pulses: Posterior Tibial Palpable: [Left:Yes] Doppler: [Left:Monophasic] Dorsalis Pedis Palpable: [Left:Yes] Doppler: [Left:Monophasic] Extremity colors, hair growth, and conditions: Extremity Color: [Left:Red] Hair Growth on Extremity: [Left:Yes] Temperature of Extremity: [Left:Warm] Capillary Refill: [Left:< 3 seconds] Toe Nail Assessment Left: Right: Thick: Yes Discolored: Yes Deformed: Yes Improper Length and Hygiene: Yes Pitones, Jamey L. ([id number]) Notes No ABI Due to wound placement. 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 Electronic Signature(s) Signed: 06/24/2015 5:36:16 PM By: Elliot Gurney, RN, BSN, Kim RN, BSN Entered By: Elliot Gurney, RN, BSN, Kim on 06/24/2015 09:30:20 Risby, [name] ([id number]) -------------------------------------------------------------------------------- Multi Wound Chart Details Patient Name: [name], [name] 06/24/2015 9:45 Date of Service: AM Medical Record [id number] Number: Patient Account Number: [id number] Date of Birth/Sex: Aug 30, 1940 (74 y.o. Male) Treating RN: Huel Coventry Primary Care [facility], Other Clinician: Physician: RUPASHREE Treating Britto, Verl Blalock,  Physician/Extender: Referring Physician: Armida Sans in Treatment: 0 Vital Signs Height(in): 70 Pulse(bpm): 62 Weight(lbs): 296 Blood Pressure 168/73 (mmHg): Body Mass Index(BMI): 42 Temperature(F): 98.0 Respiratory Rate 18 (breaths/min): Photos: [1:No Photos] [N/A:N/A] Wound Location: [1:Left Lower Leg - Lateral] [N/A:N/A] Wounding Event: [1:Trauma] [N/A:N/A] Primary Etiology: [1:Lymphedema] [N/A:N/A] Secondary Etiology: [1:Trauma, Other] [N/A:N/A] Date Acquired: [1:05/09/2015] [N/A:N/A] Weeks of Treatment: [1:0] [N/A:N/A] Wound Status: [1:Open] [N/A:N/A] Measurements L x W x D 4x7.7x1.5 [N/A:N/A] (cm) Area (cm) : [1:24.19] [N/A:N/A] Volume (cm) : [1:36.285] [N/A:N/A] Classification: [1:Full Thickness Without Exposed Support Structures] [N/A:N/A] Exudate Amount: [1:Large] [N/A:N/A] Exudate Type: [1:Serous] [N/A:N/A] Exudate Color: [1:amber] [N/A:N/A] Wound Margin: [1:Flat and Intact] [N/A:N/A] Granulation Amount: [1:Small (1-33%)] [N/A:N/A] Granulation Quality: [1:Red, Hyper-granulation] [N/A:N/A] Necrotic Amount: [1:Large (67-100%)] [N/A:N/A] Exposed Structures: [1:Fascia: No Fat: No Tendon: No Muscle: No Joint: No] [N/A:N/A] Bone: No Limited to Skin Breakdown Periwound Skin Texture: Edema: No N/A N/A Excoriation: No Induration: No Callus: No Crepitus: No Fluctuance: No Friable: No Rash: No Scarring: No Periwound Skin Moist: Yes N/A N/A Moisture:  Maceration: No Dry/Scaly: No Periwound Skin Color: Atrophie Blanche: No N/A N/A Cyanosis: No Ecchymosis: No Erythema: No Hemosiderin Staining: No Mottled: No Pallor: No Rubor: No Tenderness on No N/A N/A Palpation: Wound Preparation: Ulcer Cleansing: N/A N/A Rinsed/Irrigated with Saline Topical Anesthetic Applied: Other: lidocaine 4% Treatment Notes Electronic Signature(s) Signed: 06/24/2015 5:36:16 PM By: Elliot Gurney, RN, BSN, Kim RN, BSN Entered By: Elliot Gurney, RN, BSN, Kim on 06/24/2015  09:49:14 Naron, [name] ([id number]) -------------------------------------------------------------------------------- Multi-Disciplinary Care Plan Details Patient Name: [name], [name] 06/24/2015 9:45 Date of Service: AM Medical Record [id number] Number: Patient Account Number: [id number] Date of Birth/Sex: 24-Jan-1941 (74 y.o. Male) Treating RN: Huel Coventry Primary Care [facility], Other Clinician: Physician: RUPASHREE Treating Britto, Verl Blalock, Physician/Extender: Referring Physician: Armida Sans in Treatment: 0 Active Inactive Abuse / Safety / Falls / Self Care Management Nursing Diagnoses: Impaired physical mobility Potential for falls Goals: Patient will remain injury free Date Initiated: 06/24/2015 Goal Status: Active Interventions: Assess fall risk on admission and as needed Notes: Necrotic Tissue Nursing Diagnoses: Impaired tissue integrity related to necrotic/devitalized tissue Goals: Necrotic/devitalized tissue will be minimized in the wound bed Date Initiated: 06/24/2015 Goal Status: Active Interventions: Assess patient pain level pre-, during and post procedure and prior to discharge Treatment Activities: Apply topical anesthetic as ordered : 06/24/2015 Notes: Orientation to the Wound Care Program [name], [name] ([id number]) Nursing Diagnoses: Knowledge deficit related to the wound healing center program Goals: Patient/caregiver will verbalize understanding of the Wound Healing Center Program Date Initiated: 06/24/2015 Goal Status: Active Interventions: Provide education on orientation to the wound center Notes: Wound/Skin Impairment Nursing Diagnoses: Knowledge deficit related to ulceration/compromised skin integrity Goals: Ulcer/skin breakdown will heal within 14 weeks Date Initiated: 06/24/2015 Goal Status: Active Interventions: Provide education on ulcer and skin care Treatment Activities: Patient referred to home care  : 06/24/2015 Skin care regimen initiated : 06/24/2015 Notes: Electronic Signature(s) Signed: 06/24/2015 5:36:16 PM By: Elliot Gurney, RN, BSN, Kim RN, BSN Entered By: Elliot Gurney, RN, BSN, Kim on 06/24/2015 09:49:08 Landgren, [name] ([id number]) -------------------------------------------------------------------------------- Pain Assessment Details Patient Name: [name], [name] 06/24/2015 9:45 Date of Service: AM Medical Record [id number] Number: Patient Account Number: [id number] Date of Birth/Sex: [birth year] (74 y.o. Male) Treating RN: Huel Coventry Primary Care [facility], Other Clinician: Physician: RUPASHREE Treating Britto, Verl Blalock, Physician/Extender: Referring Physician: Armida Sans in Treatment: 0 Active Problems Location of Pain Severity and Description of Pain Patient Has Paino No Site Locations Pain Management and Medication [name] Pain Management: Electronic Signature(s) Signed: 06/24/2015 5:36:16 PM By: Elliot Gurney, RN, BSN, Kim RN, BSN Entered By: Elliot Gurney, RN, BSN, Kim on 06/24/2015 09:24:22 Lawrance, [name] ([id number]) -------------------------------------------------------------------------------- Patient/Caregiver Education Details Patient Name: [name], [name] 06/24/2015 9:45 Date of Service: AM Medical Record [id number] Number: Patient Account Number: [id number] Date of Birth/Gender: 08-Jul-1941 (74 y.o. Male) Treating RN: Huel Coventry Primary Care [facility], Other Clinician: Physician: RUPASHREE Treating Britto, Verl Blalock, Physician/Extender: Referring Physician: Armida Sans in Treatment: 0 Education Assessment Education Provided To: Patient Education Topics Provided Wound/Skin Impairment: Handouts: Caring for Your Ulcer, Other: wound care as prescribed. Call [facility] if wrap slides Methods: Demonstration, Explain/Verbal Responses: State content correctly Electronic Signature(s) Signed: 06/24/2015 5:36:16 PM By: Elliot Gurney, RN, BSN, Kim  RN, BSN Entered By: Elliot Gurney, RN, BSN, Kim on 06/24/2015 10:20:18 [name], [name] ([id number]) -------------------------------------------------------------------------------- Wound Assessment Details Patient Name: [name], [name] 06/24/2015 9:45 Date of Service: AM Medical Record [id number] Number: Patient Account Number: [id number] Date of Birth/Sex: 1941/05/24 (74 y.o. Male) Treating RN: Huel Coventry Primary Care [facility] Other  Clinician: Physician: RUPASHREE Treating Britto, Verl BlalockErrol VARADARAJAN, Physician/Extender: Referring Physician: Armida SansUPASHREE Weeks in Treatment: 0 Wound Status Wound Number: 1 Primary Etiology: Lymphedema Wound Location: Left Lower Leg - Lateral Secondary Etiology: Trauma, Other Wounding Event: Trauma Wound Status: Open Date Acquired: 05/09/2015 Weeks Of Treatment: 0 Clustered Wound: No Photos Photo Uploaded By: Elliot GurneyWoody, RN, BSN, Kim on 06/24/2015 10:53:26 Wound Measurements Length: (cm) 4 % Reduction in Width: (cm) 7.7 % Reduction in Depth: (cm) 1.5 Area: (cm) 24.19 Volume: (cm) 36.285 Area: Volume: Wound Description Full Thickness Without Exposed Classification: Support Structures Wound Margin: Flat and Intact Exudate Large Amount: Exudate Type: Serous Exudate Color: amber Foul Odor After Cleansing: No Wound Bed [place], Sutton L. ([id number]) Granulation Amount: Small (1-33%) Exposed Structure Granulation Quality: Red, Hyper-granulation Fascia Exposed: No Necrotic Amount: Large (67-100%) Fat Layer Exposed: No Necrotic Quality: Adherent Slough Tendon Exposed: No Muscle Exposed: No Joint Exposed: No Bone Exposed: No Limited to Skin Breakdown Periwound Skin Texture Texture Color No Abnormalities Noted: No No Abnormalities Noted: No Callus: No Atrophie Blanche: No Crepitus: No Cyanosis: No Excoriation: No Ecchymosis: No Fluctuance: No Erythema: No Friable: No Hemosiderin Staining: No Induration: No Mottled:  No Localized Edema: No Pallor: No Rash: No Rubor: No Scarring: No Moisture No Abnormalities Noted: No Dry / Scaly: No Maceration: No Moist: Yes Wound Preparation Ulcer Cleansing: Rinsed/Irrigated with Saline Topical Anesthetic Applied: Other: lidocaine 4%, Treatment Notes Wound #1 (Left, Lateral Lower Leg) 1. Cleansed with: Clean wound with Normal Saline 2. Anesthetic Topical Lidocaine 4% cream to wound bed prior to debridement 4. Dressing Applied: Other dressing (specify in notes) 5. Secondary Dressing Applied ABD Pad 7. Secured with Henriette CombsUnna Boot to Left Lower Extremity Electronic Signature(s) Signed: 06/24/2015 5:36:16 PM By: Elliot GurneyWoody, RN, BSN, Kim RN, BSN Blackson, Gevork Elbert EwingsL. ([id number]) Entered By: Elliot GurneyWoody, RN, BSN, Kim on 06/24/2015 09:33:42 Searfoss, Doy HutchingJERRY L. ([id number]) -------------------------------------------------------------------------------- Vitals Details Patient Name: [name], Omarri L. 06/24/2015 9:45 Date of Service: AM Medical Record [id number] Number: Patient Account Number: [id number] Date of Birth/Sex: Jun 03, 1941 [age] y.o. Male) Treating RN: Huel CoventryWoody, Kim [facility], Other Clinician: Physician: RUPASHREE Treating Britto, Verl BlalockErrol VARADARAJAN, Physician/Extender: Referring Physician: Armida SansUPASHREE Weeks in Treatment: 0 Vital Signs Time Taken: 09:24 Temperature (F): 98.0 Height (in): 70 Pulse (bpm): 62 Source: Stated Respiratory Rate (breaths/min): 18 Weight (lbs): 296 Blood Pressure (mmHg): 168/73 Source: Stated Reference Range: 80 - 120 mg / dl Body Mass Index (BMI): 42.5 Electronic Signature(s) Signed: 06/24/2015 5:36:16 PM By: Elliot GurneyWoody, RN, BSN, Kim RN, BSN Entered By: Elliot GurneyWoody, RN, BSN, Kim on 06/24/2015 09:25:09

## 2015-06-25 NOTE — Progress Notes (Signed)
WILLAIM, MODE ([id number]) Visit Report for 06/24/2015 Abuse/Suicide Risk Screen Details Patient Name: [name], [name] 06/24/2015 9:45 Date of Service: AM Medical Record [id number] Number: Patient Account Number: [id number] Date of Birth/Sex: Mar 22, 1941 (74 y.o. Male) Treating RN: Huel Coventry Primary Care [facility], Other Clinician: Physician: RUPASHREE Treating Britto, Verl Blalock, Physician/Extender: Referring Physician: Armida Sans in Treatment: 0 Abuse/Suicide Risk Screen Items Answer ABUSE/SUICIDE RISK SCREEN: Has anyone close to you tried to hurt or harm No you recentlyo Do you feel uncomfortable with anyone in Yes your familyo Do you have any thoughts of harming No yourselfo Patient displays signs or symptoms of abuse No and/or neglect. 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 If yes to any of the above questions, you also must: Make a referral to Social Services, include Not necessary, pstient just doesn't like the lady. He is date, time and person notified not afraid of her. Electronic Signature(s) Signed: 06/24/2015 5:36:16 PM By: Elliot Gurney, RN, BSN, Kim RN, BSN Entered By: Elliot Gurney, RN, BSN, Kim on 06/24/2015 09:45:16 Rozas, [name] ([id number]) -------------------------------------------------------------------------------- Activities of Daily Living Details Patient Name: [name], [name] 06/24/2015 9:45 Date of Service: AM Medical Record [id number] Number: Patient Account Number: [id number] Date of Birth/Sex: [birth year] (75 y.o. Male) Treating RN: Huel Coventry Primary Care [facility], Other Clinician: Physician: RUPASHREE Treating Britto, Verl Blalock, Physician/Extender: Referring Physician: Armida Sans in Treatment: 0 Activities of Daily Living Items Answer Activities of Daily Living (Please select one for each  item) Drive Automobile Need Assistance Take Medications Need Assistance Use Telephone Need Assistance Care for Appearance Need Assistance Use Toilet Need Assistance Bath / Shower Need Assistance Dress Self Need Assistance Feed Self Need Assistance Walk Need Assistance Get In / Out Bed Need Assistance Housework Need Assistance Prepare Meals Need Assistance Handle Money Need Assistance Shop for Self Need Assistance Electronic Signature(s) Signed: 06/24/2015 5:36:16 PM By: Elliot Gurney, RN, BSN, Kim RN, BSN Entered By: Elliot Gurney, RN, BSN, Kim on 06/24/2015 09:45:36 Lehnert, [name] ([id number]) -------------------------------------------------------------------------------- Education Assessment Details Patient Name: [name], [name] 06/24/2015 9:45 Date of Service: AM Medical Record [id number] Number: Patient Account Number: [id number] Date of Birth/Sex: [birth year] (74 y.o. Male) Treating RN: Huel Coventry Primary Care [facility], Other Clinician: Physician: RUPASHREE Treating Britto, Verl Blalock, Physician/Extender: Referring Physician: Armida Sans in Treatment: 0 Primary Learner Assessed: Patient Learning Preferences/Education Level/Primary Language Learning Preference: Explanation, Demonstration Highest Education Level: College or Above Preferred Language: English Cognitive Barrier Assessment/Beliefs Language Barrier: No Translator Needed: No Memory Deficit: No Emotional Barrier: No Cultural/Religious Beliefs Affecting Medical No Care: Physical Barrier Assessment Impaired Vision: No Impaired Hearing: No Decreased Hand dexterity: No Knowledge/Comprehension Assessment Knowledge Level: Medium Comprehension Level: Medium Ability to understand written Medium instructions: Motivation Assessment Anxiety Level: Calm Cooperation: Cooperative Education Importance: Acknowledges Need Interest in Health Problems: Asks Questions Perception: Coherent Willingness to  Engage in Self- Low Management Activities: Readiness to Engage in Self- Low Management Activities: MEDFORD, STAHELI ([id number]) Electronic Signature(s) Signed: 06/24/2015 5:36:16 PM By: Elliot Gurney, RN, BSN, Kim RN, BSN Entered By: Elliot Gurney, RN, BSN, Kim on 06/24/2015 09:46:16 Greenwood, [name] ([id number]) -------------------------------------------------------------------------------- Fall Risk Assessment Details Patient Name: [name], [name] 06/24/2015 9:45 Date of Service: AM Medical Record [id number] Number: Patient Account Number: [id number] Date of Birth/Sex: Jul 15, 1941 (74 y.o. Male) Treating RN: Huel Coventry Primary Care [facility], Other Clinician: Physician: RUPASHREE Treating Britto, Verl Blalock, Physician/Extender: Referring Physician: Armida Sans in Treatment:  0 Fall Risk Assessment Items FALL RISK ASSESSMENT: History of falling - immediate or within 3 months 25 Yes Secondary diagnosis 0 No Ambulatory aid None/bed rest/wheelchair/nurse 0 No Crutches/cane/walker 15 Yes Furniture 0 No IV Access/Saline Lock 0 No Gait/Training Normal/bed rest/immobile 0 No Weak 10 Yes Impaired 0 No Mental Status Oriented to own ability 0 Yes Electronic Signature(s) Signed: 06/24/2015 5:36:16 PM By: Elliot GurneyWoody, RN, BSN, Kim RN, BSN Entered By: Elliot GurneyWoody, RN, BSN, Kim on 06/24/2015 09:46:56 Hirt, Doy HutchingJERRY L. ([id number]) -------------------------------------------------------------------------------- Foot Assessment Details Patient Name: [name], [name] 06/24/2015 9:45 Date of Service: AM Medical Record [id number] Number: Patient Account Number: [id number] Date of Birth/Sex: [birth year] (74 y.o. Male) Treating RN: Huel CoventryWoody, [facility] Other Clinician: Physician: RUPASHREE Treating Britto, Verl BlalockErrol VARADARAJAN, Physician/Extender: Referring Physician: Armida SansUPASHREE Weeks in Treatment: 0 Foot Assessment Items Site Locations + = Sensation present, - =  Sensation absent, C = Callus, U = Ulcer R = Redness, W = Warmth, M = Maceration, PU = Pre-ulcerative lesion F = Fissure, S = Swelling, D = Dryness Assessment Right: Left: Other Deformity: No No Prior Foot Ulcer: No No Prior Amputation: No No Charcot Joint: No No Ambulatory Status: Ambulatory With Help Assistance Device: Walker GaitFutures trader: Steady Electronic Signature(s) Signed: 06/24/2015 5:36:16 PM By: Elliot GurneyWoody, RN, BSN, Kim RN, BSN Craker, Durand Elbert EwingsL. ([id number]) Entered By: Elliot GurneyWoody, RN, BSN, Kim on 06/24/2015 09:47:41 Mckissic, Doy HutchingJERRY L. ([id number]) -------------------------------------------------------------------------------- Nutrition Risk Assessment Details Patient Name: [name], [name] 06/24/2015 9:45 Date of Service: AM Medical Record [id number] Number: Patient Account Number: [id number] Date of Birth/Sex: [birth year](74 y.o. Male) Treating RN: Huel CoventryWoody, Kim [facility], Other Clinician: Physician: RUPASHREE Treating Britto, Verl BlalockErrol VARADARAJAN, Physician/Extender: Referring Physician: Armida SansUPASHREE Weeks in Treatment: 0 Height (in): 70 Weight (lbs): 296 Body Mass Index (BMI): 42.5 Nutrition Risk Assessment Items NUTRITION RISK SCREEN: I have an illness or condition that made me change the kind and/or 0 No amount of food I eat I eat fewer than two meals per day 0 No I eat few fruits and vegetables, or milk products 0 No I have three or more drinks of beer, liquor or wine almost every day 0 No I have tooth or mouth problems that make it hard for me to eat 0 No I don't always have enough money to buy the food I need 0 No I eat alone most of the time 0 No I take three or more different prescribed or over-the-counter drugs a 0 No day Without wanting to, I have lost or gained 10 pounds in the last six 0 No months I am not always physically able to shop, cook and/or feed myself 0 No Nutrition Protocols Good Risk Protocol 0 No interventions needed Moderate Risk  Protocol Electronic Signature(s) Signed: 06/24/2015 5:36:16 PM By: Elliot GurneyWoody, RN, BSN, Kim RN, BSN Entered By: Elliot GurneyWoody, RN, BSN, Kim on 06/24/2015 09:47:11

## 2015-06-25 NOTE — Progress Notes (Signed)
[name] ([id number]) Visit Report for 06/24/2015 Chief Complaint Document Details Patient Name: [name], [name] 06/24/2015 9:45 Date of Service: AM Medical Record [id number] Number: Patient Account Number: [id number] Date of Birth/Sex: 1940/10/31 (74 y.o. Male) Treating RN: Huel Coventry Primary Care [facility], Other Clinician: Physician: RUPASHREE Treating Estephania Licciardi, Verl Blalock, Physician/Extender: Referring Physician: Armida Sans in Treatment: 0 Information Obtained from: Patient Chief Complaint Patient presents to the wound care center for a consult due non healing wound. nonhealing wound of the left lower extremity which has been present for about a month and a half. Electronic Signature(s) Signed: 06/24/2015 10:14:01 AM By: Evlyn Kanner MD, FACS Entered By: Evlyn Kanner on 06/24/2015 10:14:01 [name], [name] ([id number]) -------------------------------------------------------------------------------- HPI Details Patient Name: [name], [name] 06/24/2015 9:45 Date of Service: AM Medical Record [id number] Number: Patient Account Number: [id number] Date of Birth/Sex: 12-28-1940 (74 y.o. Male) Treating RN: Huel Coventry Primary Care [facility], Other Clinician: Physician: RUPASHREE Treating Yekaterina Escutia, Verl Blalock, Physician/Extender: Referring Physician: Armida Sans in Treatment: 0 History of Present Illness Location: left lower extremity wound Quality: Patient reports experiencing a dull pain to affected area(s). Severity: Patient states wound are getting worse. Duration: Patient has had the wound for < 6 weeks prior to presenting for treatment Timing: Pain in wound is Intermittent (comes and goes Context: The wound occurred when the patient had a fall and cut his leg which required several sutures. He is also been on several antibiotics including ciprofloxacin and clindamycin. Modifying Factors: Consults to this date include:he has had  bilateral lower limb lymphedema and has been using lymphedema pumps. Associated Signs and Symptoms: Patient reports having difficulty standing for long periods. HPI Description: 74 year old gentleman who has a history of seizures and schizophrenia with bipolar disorder enhances in a assisted living facility. He also has CHF, COPD, GERD and remote history of DVT. He has been treated by the vascular group at [facility] for bilateral lymphedema and has been using lymphedema pumps. This problem started on September 30 when he had a lacerated wound after a fall. The staples were left in for about 10 days and when the staples were removed the wound opened out. Since then he's had several issues with wound infection and the most recent culture showed a heavy growth of Pseudomonas aeruginosa and Staphylococcus aureus and he has received clindamycin and the most recent past and from today he is on levofloxacin. Addendum:Notes were reviewed from a previous vascular consult at [facility] regional by Dr. Gilda Crease who saw him on 05/27/2014. He had recommended graduated compression stockings class I and also added lymph pumps to improve the control of the patient's lymphedema. Electronic Signature(s) Signed: 06/24/2015 10:59:13 AM By: Evlyn Kanner MD, FACS Previous Signature: 06/24/2015 10:17:05 AM Version By: Evlyn Kanner MD, FACS Entered By: Evlyn Kanner on 06/24/2015 10:59:13 [name], [name] ([id number]) -------------------------------------------------------------------------------- Physical Exam Details Patient Name: [name], [name] 06/24/2015 9:45 Date of Service: AM Medical Record [id number] Number: Patient Account Number: [id number] Date of Birth/Sex: 03/29/1941 (74 y.o. Male) Treating RN: Huel Coventry Primary Care [facility], Other Clinician: Physician: RUPASHREE Treating Deserae Jennings, Verl Blalock, Physician/Extender: Referring Physician: Armida Sans in Treatment:  0 Constitutional . Pulse regular. Respirations normal and unlabored. Afebrile. . Eyes Nonicteric. Reactive to light. Ears, Nose, Mouth, and Throat Lips, teeth, and gums WNL.Marland Kitchen Moist mucosa without lesions . Neck supple and nontender. No palpable supraclavicular or cervical adenopathy. Normal sized without goiter. Respiratory WNL. No retractions.. Cardiovascular Pedal Pulses WNL. ABI could not be measured. he has massive stage II  lymphedema both lower extremities. Gastrointestinal (GI) Abdomen without masses or tenderness.. No liver or spleen enlargement or tenderness.. Lymphatic No adneopathy. No adenopathy. No adenopathy. Musculoskeletal Adexa without tenderness or enlargement.. Digits and nails w/o clubbing, cyanosis, infection, petechiae, ischemia, or inflammatory conditions.. Integumentary (Hair, Skin) No suspicious lesions. No crepitus or fluctuance. No peri-wound warmth or erythema. No masses.Marland Kitchen Psychiatric Judgement and insight Intact.. No evidence of depression, anxiety, or agitation.. Notes he has got a large open wound on the left lower extremity in the region of the lateral calf, and this is down to the subcutaneous tissue and has minimal debris. Both lower extremities shows typical signs of stage II lymphedema with skin changes with fibrosis and thickening with some evidence of scaliness. Electronic Signature(s) Signed: 06/24/2015 10:18:39 AM By: Evlyn Kanner MD, FACS Entered By: Evlyn Kanner on 06/24/2015 10:18:39 [name] ([id number]) [name] ([id number]) -------------------------------------------------------------------------------- Physician Orders Details Patient Name: [name], [name] 06/24/2015 9:45 Date of Service: AM Medical Record [id number] Number: Patient Account Number: [id number] Date of Birth/Sex: [birth year] (74 y.o. Male) Treating RN: Huel Coventry Primary Care [facility], Other Clinician: Physician: RUPASHREE  Treating Gesselle Fitzsimons, Verl Blalock, Physician/Extender: Referring Physician: Armida Sans in Treatment: 0 Verbal / Phone Orders: Yes Clinician: Huel Coventry Read Back and Verified: Yes Diagnosis Coding Wound Cleansing Wound #1 Left,Lateral Lower Leg o Clean wound with Normal Saline. Anesthetic Wound #1 Left,Lateral Lower Leg o Topical Lidocaine 4% cream applied to wound bed prior to debridement Primary Wound Dressing Wound #1 Left,Lateral Lower Leg o Other: - Siltec packed into wound Secondary Dressing Wound #1 Left,Lateral Lower Leg o ABD pad Dressing Change Frequency Wound #1 Left,Lateral Lower Leg o Change dressing every week o Other: - Home health to change wrap if it slides down more than 1 inch; caregiver with call HHRN if necessary. Follow-up Appointments Wound #1 Left,Lateral Lower Leg o Return Appointment in 1 week. Edema Control Wound #1 Left,Lateral Lower Leg o Unna Boot to Left Lower Extremity - Pink paste o Elevate legs to the level of the heart and pump ankles as often as possible o Compression Pump: Use compression pump on left lower extremity for 30 minutes, twice daily. [name], [name] ([id number]) Additional Orders / Instructions Wound #1 Left,Lateral Lower Leg o Increase protein intake. o Activity as tolerated Home Health Wound #1 Left,Lateral Lower Leg o Continue Home Health Visits - HHRN to be notified to change wrap if it slides more than one inch. Sorbact dressing sent with caregiver. o Home Health Nurse may visit PRN to address patientos wound care needs. o FACE TO FACE ENCOUNTER: MEDICARE and MEDICAID PATIENTS: I certify that this patient is under my care and that I had a face-to-face encounter that meets the physician face-to-face encounter requirements with this patient on this date. The encounter with the patient was in whole or in part for the following MEDICAL CONDITION: (primary reason for Home  Healthcare) MEDICAL NECESSITY: I certify, that based on my findings, NURSING services are a medically necessary home health service. HOME BOUND STATUS: I certify that my clinical findings support that this patient is homebound (i.e., Due to illness or injury, pt requires aid of supportive devices such as crutches, cane, wheelchairs, walkers, the use of special transportation or the assistance of another person to leave their place of residence. There is a normal inability to leave the home and doing so requires considerable and taxing effort. Other absences are for medical reasons / religious services and are infrequent or of short duration  when for other reasons). o If current dressing causes regression in wound condition, may D/C ordered dressing product/s and apply Normal Saline Moist Dressing daily until next Wound Healing Center / Other MD appointment. Notify Wound Healing Center of regression in wound condition at [phone]. o Please direct any NON-WOUND related issues/requests for orders to patient's Primary Care Physician Electronic Signature(s) Signed: 06/24/2015 4:35:06 PM By: Evlyn Kanner MD, FACS Signed: 06/24/2015 5:36:16 PM By: Elliot Gurney RN, BSN, Kim RN, BSN Entered By: Elliot Gurney, RN, BSN, Kim on 06/24/2015 10:09:37 [place], [name] ([id number]) -------------------------------------------------------------------------------- Problem List Details Patient Name: [name], [name] 06/24/2015 9:45 Date of Service: AM Medical Record [id number] Number: Patient Account Number: [id number] Date of Birth/Sex: Aug 06, 1941 (74 y.o. 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 S81.811A Laceration without foreign body, right lower leg, initial 06/24/2015 Yes encounter E66.01 Morbid (severe) obesity due to excess calories 06/24/2015 Yes I50.9 Heart failure, unspecified 06/24/2015 Yes Inactive Problems Resolved Problems Electronic Signature(s) Signed: 06/24/2015 10:13:22 AM By: Evlyn Kanner MD, FACS Entered By: Evlyn Kanner on 06/24/2015 10:13:21 [name], [name] ([id number]) -------------------------------------------------------------------------------- Progress Note Details Patient Name: [name], [name] 06/24/2015 9:45 Date of Service: AM Medical Record [id number] Number: Patient Account Number: [id number] Date of Birth/Sex: 1941-01-25 (74 y.o. Male) Treating RN: Huel Coventry Primary Care [facility] [place], Other Clinician: Physician: RUPASHREE Treating Charles Andringa, Verl Blalock, Physician/Extender: Referring Physician: Armida Sans in Treatment: 0 Subjective Chief Complaint Information obtained from Patient Patient presents to the wound care center for a consult due non healing wound. nonhealing wound of the left lower extremity which has been present for about a month and a half. History of Present Illness (HPI) The following HPI elements were documented for the patient's wound: Location: left lower extremity wound Quality: Patient reports experiencing a dull pain to affected area(s). Severity: Patient states wound are getting worse. Duration: Patient has had the wound for < 6 weeks prior to presenting for treatment Timing: Pain in wound is Intermittent (comes and goes Context: The wound occurred when the patient had a fall and cut his leg which required several sutures. He is also been on several antibiotics including ciprofloxacin and clindamycin. Modifying Factors: Consults to this date include:he has had bilateral lower limb lymphedema and has been using  lymphedema pumps. Associated Signs and Symptoms: Patient reports having difficulty standing for long periods. 74 year old gentleman who has a history of seizures and schizophrenia with bipolar disorder enhances in a assisted living facility. He also has CHF, COPD, GERD and remote history of DVT. He has been treated by the vascular group at [facility] regional for bilateral lymphedema and has been using lymphedema pumps. This problem started on September 30 when he had a lacerated wound after a fall. The staples were left in for about 10 days and when the staples were removed the wound opened out. Since then he's had several issues with wound infection and the most recent culture showed a heavy growth of Pseudomonas aeruginosa and Staphylococcus aureus and he has received clindamycin and the most recent past and from today he is on levofloxacin. Addendum:Notes were reviewed from a previous vascular consult at [facility] regional by Dr. Gilda Crease who saw him on 05/27/2014. He had recommended graduated compression stockings class I and also added  lymph pumps to improve the control of the patient's lymphedema. Wound History Patient presents with 1 open wound that has been present for approximately SPETEMBER 30. Patient has been treating wound in the following manner: STAPLES Rremoved, santyl. Laboratory tests have been Klute, Jovan L. ([id number]) performed in the last month. Patient reportedly has tested positive for an antibiotic resistant organism. Patient reportedly has not tested positive for osteomyelitis. Patient reportedly has had testing performed to evaluate circulation in the legs. Patient History Information obtained from Patient, Caregiver. Allergies penicillin Family History Hypertension - Mother, No family history of Cancer, Diabetes, Heart Disease, Kidney Disease, Lung Disease, Seizures, Stroke, Thyroid Problems. Social History Never smoker, Marital Status - Single, Alcohol Use -  Never, Drug Use - No History, Caffeine Use - Daily. Medical History Eyes Denies history of Cataracts, Glaucoma, Optic Neuritis Ear/Nose/Mouth/Throat Denies history of Chronic sinus problems/congestion, Middle ear problems Hematologic/Lymphatic Patient has history of Lymphedema Denies history of Anemia, Hemophilia, Human Immunodeficiency Virus Respiratory Patient has history of Chronic Obstructive Pulmonary Disease (COPD), Sleep Apnea - has c-pap but will not wear it Denies history of Aspiration, Asthma, Pneumothorax, Tuberculosis Cardiovascular Patient has history of Congestive Heart Failure, Deep Vein Thrombosis, Hypertension Denies history of Angina, Arrhythmia, Coronary Artery Disease, Hypotension, Myocardial Infarction, Peripheral Arterial Disease, Peripheral Venous Disease, Phlebitis, Vasculitis Gastrointestinal Denies history of Cirrhosis , Colitis, Crohn s, Hepatitis A, Hepatitis B, Hepatitis C Endocrine Denies history of Type I Diabetes, Type II Diabetes Genitourinary Denies history of End Stage Renal Disease Immunological Denies history of Lupus Erythematosus, Raynaud s, Scleroderma Integumentary (Skin) Denies history of History of Burn, History of pressure wounds Musculoskeletal Denies history of Gout, Rheumatoid Arthritis, Osteoarthritis, Osteomyelitis Neurologic Patient has history of Seizure Disorder Denies history of Dementia, Neuropathy, Quadriplegia, Paraplegia Oncologic [name], [name] ([id number]) Denies history of Received Chemotherapy, Received Radiation Medical And Surgical History Notes Constitutional Symptoms (General Health) CHF; COPD, Hypertension, GERD, Seizure, high cholesterol, bipolar; DVT (patient states no to DVT) Psychiatric Schizopherenia; Bipolar Review of Systems (ROS) Constitutional Symptoms (General Health) The patient has no complaints or symptoms. Eyes The patient has no complaints or symptoms. Ear/Nose/Mouth/Throat The patient  has no complaints or symptoms. Hematologic/Lymphatic The patient has no complaints or symptoms. Respiratory The patient has no complaints or symptoms. Cardiovascular Complains or has symptoms of LE edema. Denies complaints or symptoms of Chest pain. Gastrointestinal The patient has no complaints or symptoms. Endocrine Denies complaints or symptoms of Hepatitis, Thyroid disease, Polydypsia (Excessive Thirst). Genitourinary The patient has no complaints or symptoms. Immunological Complains or has symptoms of Itching - leg itches. Integumentary (Skin) Complains or has symptoms of Wounds, Bleeding or bruising tendency. Denies complaints or symptoms of Breakdown, Swelling. Musculoskeletal The patient has no complaints or symptoms. Neurologic The patient has no complaints or symptoms. Oncologic The patient has no complaints or symptoms. Psychiatric The patient has no complaints or symptoms. Objective Constitutional [name], [name] ([id number]) Pulse regular. Respirations normal and unlabored. Afebrile. Vitals Time Taken: 9:24 AM, Height: 70 in, Source: Stated, Weight: 296 lbs, Source: Stated, BMI: 42.5, Temperature: 98.0 F, Pulse: 62 bpm, Respiratory Rate: 18 breaths/min, Blood Pressure: 168/73 mmHg. Eyes Nonicteric. Reactive to light. Ears, Nose, Mouth, and Throat Lips, teeth, and gums WNL.Marland Kitchen Moist mucosa without lesions . Neck supple and nontender. No palpable supraclavicular or cervical adenopathy. Normal sized without goiter. Respiratory WNL. No retractions.. Cardiovascular Pedal Pulses WNL. ABI could not be measured. he has massive stage II lymphedema both lower extremities. Gastrointestinal (GI) Abdomen without masses or tenderness.Marland Kitchen No  liver or spleen enlargement or tenderness.. Lymphatic No adneopathy. No adenopathy. No adenopathy. Musculoskeletal Adexa without tenderness or enlargement.. Digits and nails w/o clubbing, cyanosis, infection, petechiae, ischemia,  or inflammatory conditions.Marland Kitchen Psychiatric Judgement and insight Intact.. No evidence of depression, anxiety, or agitation.. General Notes: he has got a large open wound on the left lower extremity in the region of the lateral calf, and this is down to the subcutaneous tissue and has minimal debris. Both lower extremities shows typical signs of stage II lymphedema with skin changes with fibrosis and thickening with some evidence of scaliness. Integumentary (Hair, Skin) No suspicious lesions. No crepitus or fluctuance. No peri-wound warmth or erythema. No masses.. Wound #1 status is Open. Original cause of wound was Trauma. The wound is located on the Left,Lateral Lower Leg. The wound measures 4cm length x 7.7cm width x 1.5cm depth; 24.19cm^2 area and 36.285cm^3 volume. The wound is limited to skin breakdown. There is a large amount of serous drainage noted. The wound margin is flat and intact. There is small (1-33%) red granulation within the wound bed. There is a large (67-100%) amount of necrotic tissue within the wound bed including Adherent Slough. The periwound skin appearance exhibited: Moist. The periwound skin appearance did not exhibit: Callus, Crepitus, Excoriation, Fluctuance, Friable, Induration, Localized Edema, Rash, Scarring, Dry/Scaly, Lafont, Omario L. ([id number]) Maceration, Atrophie Blanche, Cyanosis, Ecchymosis, Hemosiderin Staining, Mottled, Pallor, Rubor, Erythema. Assessment Active Problems ICD-10 I89.0 - Lymphedema, not elsewhere classified L97.222 - Non-pressure chronic ulcer of left calf with fat layer exposed S81.811A - Laceration without foreign body, right lower leg, initial encounter E66.01 - Morbid (severe) obesity due to excess calories I50.9 - Heart failure, unspecified This 74 year old gentleman who has an underlying issue of stage II lymphedema has now had a deep lacerated wound which has been present for over 6 weeks. I have recommended: 1. Sorbact to  be packed into the wound and appropriate bolster being applied and then using an Unna's boot on the left lower extremity. 2. continue with ambulation as required but when he is in the sitting or sleeping position he should elevate his limbs 3. She should use his lymphedema pumps twice a day and it is safe to use it over the Unna's boot. 4. Complete his course of antibiotics as prescribed by his PCP. 5. we will get a copy of his vascular notes from the [place] [facility] vascular group. Regimen discussed with his caregiver and the patient and they are both going to be compliant. We will see him back next week. Plan Wound Cleansing: Wound #1 Left,Lateral Lower Leg: Clean wound with Normal Saline. Anesthetic: Wound #1 Left,Lateral Lower Leg: Topical Lidocaine 4% cream applied to wound bed prior to debridement Primary Wound Dressing: Wound #1 Left,Lateral Lower Leg: Other: - Siltec packed into wound Secondary Dressing: Wound #1 Left,Lateral Lower Leg: Leitz, Veer L. ([id number]) ABD pad Dressing Change Frequency: Wound #1 Left,Lateral Lower Leg: Change dressing every week Other: - Home health to change wrap if it slides down more than 1 inch; caregiver with call HHRN if necessary. Follow-up Appointments: Wound #1 Left,Lateral Lower Leg: Return Appointment in 1 week. Edema Control: Wound #1 Left,Lateral Lower Leg: Unna Boot to Left Lower Extremity - Pink paste Elevate legs to the level of the heart and pump ankles as often as possible Compression Pump: Use compression pump on left lower extremity for 30 minutes, twice daily. Additional Orders / Instructions: Wound #1 Left,Lateral Lower Leg: Increase protein intake. Activity as tolerated Home Health: Wound #1  Left,Lateral Lower Leg: Continue Home Health Visits - HHRN to be notified to change wrap if it slides more than one inch. Sorbact dressing sent with caregiver. Home Health Nurse may visit PRN to address patient s wound  care needs. FACE TO FACE ENCOUNTER: MEDICARE and MEDICAID PATIENTS: I certify that this patient is under my care and that I had a face-to-face encounter that meets the physician face-to-face encounter requirements with this patient on this date. The encounter with the patient was in whole or in part for the following MEDICAL CONDITION: (primary reason for Home Healthcare) MEDICAL NECESSITY: I certify, that based on my findings, NURSING services are a medically necessary home health service. HOME BOUND STATUS: I certify that my clinical findings support that this patient is homebound (i.e., Due to illness or injury, pt requires aid of supportive devices such as crutches, cane, wheelchairs, walkers, the use of special transportation or the assistance of another person to leave their place of residence. There is a normal inability to leave the home and doing so requires considerable and taxing effort. Other absences are for medical reasons / religious services and are infrequent or of short duration when for other reasons). If current dressing causes regression in wound condition, may D/C ordered dressing product/s and apply Normal Saline Moist Dressing daily until next Wound Healing Center / Other MD appointment. Notify Wound Healing Center of regression in wound condition at [phone]. Please direct any NON-WOUND related issues/requests for orders to patient's Primary Care Physician This 74 year old gentleman who has an underlying issue of stage II lymphedema has now had a deep lacerated wound which has been present for over 6 weeks. I have recommended: 1. Sorbact to be packed into the wound and appropriate bolster being applied and then using an Unna's boot on the left lower extremity. 2. continue with ambulation as required but when he is in the sitting or sleeping position he should elevate his limbs [name], [name] ([id number]) 3. She should use his lymphedema pumps twice a day and it  is safe to use it over the Unna's boot. 4. Complete his course of antibiotics as prescribed by his PCP. 5. we will get a copy of his vascular notes from the  regional vascular group. Regimen discussed with his caregiver and the patient and they are both going to be compliant. We will see him back next week. Electronic Signature(s) Signed: 06/24/2015 10:59:29 AM By: Evlyn Kanner MD, FACS Previous Signature: 06/24/2015 10:35:54 AM Version By: Evlyn Kanner MD, FACS Previous Signature: 06/24/2015 10:21:16 AM Version By: Evlyn Kanner MD, FACS Entered By: Evlyn Kanner on 06/24/2015 10:59:28 Kitzmiller, [name] ([id number]) -------------------------------------------------------------------------------- ROS/PFSH Details Patient Name: AUGUSTIN, BUN 06/24/2015 9:45 Date of Service: AM Medical Record [id number] Number: Patient Account Number: [id number] Date of Birth/Sex: Feb 10, 1941 (74 y.o. Male) Treating RN: Huel Coventry Primary Care [facility], Other Clinician: Physician: RUPASHREE Treating Osborn Pullin, Verl Blalock, Physician/Extender: Referring Physician: Armida Sans in Treatment: 0 Information Obtained From Patient Caregiver Wound History Do you currently have one or more open woundso Yes How many open wounds do you currently haveo 1 Approximately how long have you had your woundso SPETEMBER 30 How have you been treating your wound(s) until nowo STAPLES Rremoved, santyl Has your wound(s) ever healed and then re-openedo No Have you had any lab work done in the past montho Yes 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, 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 Have you tested positive for osteomyelitis (bone infection)o No Have you had any  tests for circulation on your legso Yes Who ordered the testo [facility] Cardiovascular Complaints and Symptoms: Positive for: LE edema Negative for: Chest pain Medical History: Positive for: Congestive Heart Failure;  Deep Vein Thrombosis; Hypertension Negative for: Angina; Arrhythmia; Coronary Artery Disease; Hypotension; Myocardial Infarction; Peripheral Arterial Disease; Peripheral Venous Disease; Phlebitis; Vasculitis Endocrine Complaints and Symptoms: Negative for: Hepatitis; Thyroid disease; Polydypsia (Excessive Thirst) Medical History: Negative for: Type I Diabetes; Type II Diabetes Immunological Cretella, Jovoni L. ([id number]) Complaints and Symptoms: Positive for: Itching - leg itches Medical History: Negative for: Lupus Erythematosus; Raynaudos; Scleroderma Integumentary (Skin) Complaints and Symptoms: Positive for: Wounds; Bleeding or bruising tendency Negative for: Breakdown; Swelling Medical History: Negative for: History of Burn; History of pressure wounds Constitutional Symptoms (General Health) Complaints and Symptoms: No Complaints or Symptoms Medical History: Past Medical History Notes: CHF; COPD, Hypertension, GERD, Seizure, high cholesterol, bipolar; DVT (patient states no to DVT) Eyes Complaints and Symptoms: No Complaints or Symptoms Medical History: Negative for: Cataracts; Glaucoma; Optic Neuritis Ear/Nose/Mouth/Throat Complaints and Symptoms: No Complaints or Symptoms Medical History: Negative for: Chronic sinus problems/congestion; Middle ear problems Hematologic/Lymphatic Complaints and Symptoms: No Complaints or Symptoms Medical History: Positive for: Lymphedema Negative for: Anemia; Hemophilia; Human Immunodeficiency Virus Respiratory Lerch, Dominque L. ([id number]) Complaints and Symptoms: No Complaints or Symptoms Medical History: Positive for: Chronic Obstructive Pulmonary Disease (COPD); Sleep Apnea - has c-pap but will not wear it Negative for: Aspiration; Asthma; Pneumothorax; Tuberculosis Gastrointestinal Complaints and Symptoms: No Complaints or Symptoms Medical History: Negative for: Cirrhosis ; Colitis; Crohnos; Hepatitis A; Hepatitis B;  Hepatitis C Genitourinary Complaints and Symptoms: No Complaints or Symptoms Medical History: Negative for: End Stage Renal Disease Musculoskeletal Complaints and Symptoms: No Complaints or Symptoms Medical History: Negative for: Gout; Rheumatoid Arthritis; Osteoarthritis; Osteomyelitis Neurologic Complaints and Symptoms: No Complaints or Symptoms Medical History: Positive for: Seizure Disorder Negative for: Dementia; Neuropathy; Quadriplegia; Paraplegia Oncologic Complaints and Symptoms: No Complaints or Symptoms Medical History: Negative for: Received Chemotherapy; Received Radiation Psychiatric Complaints and Symptoms: No Complaints or Symptoms Camus, Buddy L. ([id number]) Medical History: Past Medical History Notes: Schizopherenia; Bipolar Family and Social History Cancer: No; Diabetes: No; Heart Disease: No; Hypertension: Yes - Mother; Kidney Disease: No; Lung Disease: No; Seizures: No; Stroke: No; Thyroid Problems: No; Never smoker; Marital Status - Single; Alcohol Use: Never; Drug Use: No History; Caffeine Use: Daily; Advanced Directives: No; Patient does not want information on Advanced Directives; Living Will: No; Medical Power of Attorney: No Physician Affirmation I have reviewed and agree with the above information. Electronic Signature(s) Signed: 06/24/2015 9:47:06 AM By: Evlyn Kanner MD, FACS Signed: 06/24/2015 5:36:16 PM By: Elliot Gurney RN, BSN, Kim RN, BSN Entered By: Evlyn Kanner on 06/24/2015 09:47:04 Manzo, [name] ([id number]) -------------------------------------------------------------------------------- SuperBill Details Patient Name: [name] Date of Service: 06/24/2015 Medical Record Number: [id number] Patient Account Number: [id number] Date of Birth/Sex: 1940-12-16 (74 y.o. Male) Treating RN: Huel Coventry Primary Care Physician: Lorenda Ishihara Other Clinician: Referring Physician: Lorenda Ishihara Treating  Physician/Extender: Rudene Re in Treatment: 0 Diagnosis Coding ICD-10 Codes Code Description I89.0 Lymphedema, not elsewhere classified L97.222 Non-pressure chronic ulcer of left calf with fat layer exposed S81.811A Laceration without foreign body, right lower leg, initial encounter E66.01 Morbid (severe) obesity due to excess calories I50.9 Heart failure, unspecified Facility Procedures CPT4 Code: 24401027 Description: 99213 - WOUND CARE VISIT-LEV 3 EST PT Modifier: Quantity: 1 CPT4 Code: 25366440 Description: (Facility Use Only) 29580LT - APPLY UNNA BOOT LT Modifier: Quantity: 1 Physician Procedures CPT4 Code Description: 3474259  [id number] - WC PHYS LEVEL 4 - NEW PT ICD-10 Description Diagnosis I89.0 Lymphedema, not elsewhere classified L97.222 Non-pressure chronic ulcer of left calf with fat layer S81.811A Laceration without foreign body, right lower  leg, init I50.9 Heart failure, unspecified Modifier: exposed ial encounte Quantity: 1 r Electronic Signature(s) Signed: 06/24/2015 10:21:54 AM By: Evlyn KannerBritto, Zygmund Passero MD, FACS Entered By: Evlyn KannerBritto, Olayinka Gathers on 06/24/2015 10:21:54

## 2015-06-30 ENCOUNTER — Encounter: Payer: Medicare Other | Admitting: Surgery

## 2015-06-30 DIAGNOSIS — L97222 Non-pressure chronic ulcer of left calf with fat layer exposed: Secondary | ICD-10-CM | POA: Diagnosis not present

## 2015-07-01 NOTE — Progress Notes (Addendum)
[name], [name] ([id number]) Visit Report for 06/30/2015 Arrival Information Details Patient Name: [name], [name] 06/30/2015 3:00 Date of Service: PM Medical Record [id number] Number: Patient Account Number: [id number] Date of Birth/Sex: 1941-06-18 (74 y.o. Male) Treating RN: [name] Primary Care [facility], Other Clinician: Physician: [name] Treating [name], [name], Physician/Extender: Referring Physician: Armida [name] in Treatment: 0 Visit Information History Since Last Visit All ordered tests and consults were completed: No Patient Arrived: [name] Added or deleted any medications: No Arrival Time: 15:06 Any new allergies or adverse reactions: No Accompanied By: facility staff Had a fall or experienced change in No Transfer Assistance: None activities of daily living that may affect Patient Identification Verified: Yes risk of falls: Secondary Verification Process Yes Signs or symptoms of abuse/neglect since last No Completed: visito Patient Requires Transmission- Yes Hospitalized since last visit: No Based Precautions: Has Dressing in Place as Prescribed: Yes Transmission-Based Contact MRSA Has Compression in Place as Prescribed: Yes Precautions: Pain Present Now: No Patient Has Alerts: Yes Patient Alerts: NOT Diabetic [facility] schizophrenia and bipolar Electronic Signature(s) Signed: 06/30/2015 3:08:05 PM By: [name] Starch RN, [name] Previous Signature: 06/30/2015 3:07:48 PM Version By: [name] BSN, RN Entered By: [name] Starch, RN, [name] on 06/30/2015 15:08:05 [name] ([id number]) -------------------------------------------------------------------------------- Encounter Discharge Information Details Patient Name: [name], [name] 06/30/2015 3:00 Date of Service: PM Medical Record [id number] Number: Patient Account Number: [id number] Date of Birth/Sex: Sep 07, 1940 (74 y.o. Male) Treating RN:  [name] Primary Care [facility], Other Clinician: Physician: [name] Treating [name], [name], Physician/Extender: Referring Physician: Armida [name] in Treatment: 0 Encounter Discharge Information Items Facility Notification Discharge Pain Level: 0 Facility Type: Home Health Discharge Condition: Stable Orders Sent: Yes Ambulatory Status: Walker Additional Notification Discharge Destination: Nursing Home Facility Type: Long Term Care Facility Transportation: Private Auto Orders Sent: Yes Accompanied By: facility staff Schedule Follow-up Appointment: Yes Medication Reconciliation completed and provided to Patient/Care Yes [name]: Provided on Clinical Summary of Care: 06/30/2015 Form Type Recipient Paper Patient [name] Electronic Signature(s) Signed: 06/30/2015 4:23:47 PM By: [name] Starch RN, [name] Previous Signature: 06/30/2015 3:38:47 PM Version By: Gwenlyn Perking Entered By: [name] Starch RN, [name] on 06/30/2015 15:50:58 [name] ([id number]) -------------------------------------------------------------------------------- Lower Extremity Assessment Details Patient Name: [name], [name] 06/30/2015 3:00 Date of Service: PM Medical Record [id number] Number: Patient Account Number: [id number] Date of Birth/Sex: 07/21/1941 (74 y.o. Male) Treating RN: [name] Primary Care [name], Other Clinician: Physician: [name] Treating [name], [name], Physician/Extender: Referring Physician: Armida [name] in Treatment: 0 Edema Assessment Assessed: [Left: No] [Right: No] Edema: [Left: Ye] [Right: s] Calf Left: Right: Point of Measurement: cm From Medial Instep 52 cm cm Ankle Left: Right: Point of Measurement: cm From Medial Instep 36 cm cm Vascular Assessment Claudication: Claudication Assessment [Left:None] Pulses: Posterior Tibial Palpable: [Left:Yes] Dorsalis Pedis Palpable: [Left:Yes] Extremity colors, hair  growth, and conditions: Extremity Color: [Left:Mottled] Hair Growth on Extremity: [Left:No] Temperature of Extremity: [Left:Warm] Capillary Refill: [Left:< 3 seconds] Dependent Rubor: [Left:No] Blanched when Elevated: [Left:No] Lipodermatosclerosis: [Left:No] Toe Nail Assessment Left: Right: Thick: Yes Discolored: No [name], [name] ([id number]) Deformed: No Improper Length and Hygiene: Yes Electronic Signature(s) Signed: 06/30/2015 3:16:33 PM By: [name] Starch, RN, [name] Entered By: [name] Starch RN, [name] on 06/30/2015 15:16:33 [name] ([id number]) -------------------------------------------------------------------------------- Pain Assessment Details Patient Name: [name], [name] 06/30/2015 3:00 Date of Service: PM Medical Record [id number] Number: Patient Account Number: [id number] Date of Birth/Sex: 1941-07-07 (74 y.o. Male) Treating RN: [name] Primary Care [facility], Other Clinician: Physician: [name] Treating [name], [name]  [name], Physician/Extender: Referring Physician: Armida [name] in Treatment: 0 Active Problems Location of Pain Severity and Description of Pain Patient Has Paino No Site Locations Rate the pain. Current Pain Level: 0 Pain Management and Medication Current Pain Management: Electronic Signature(s) Signed: 06/30/2015 3:08:20 PM By: [name] Starch, RN, [name] Entered By: [name] Starch, RN, [name] on 06/30/2015 15:08:20 [place], [name] ([id number]) -------------------------------------------------------------------------------- Patient/Caregiver Education Details Patient Name: [name], [name] 06/30/2015 3:00 Date of Service: PM Medical Record [id number] Number: Patient Account Number: [id number] Date of Birth/Gender: [birth year] (74 y.o. Male) Treating RN: [name] Primary Care [facility], Other Clinician: Physician: [name] Treating [name], [name], Physician/Extender: Referring  Physician: Armida [name] in Treatment: 0 Education Assessment Education Provided To: Patient Education Topics Provided Venous: Handouts: Other: unna boot Methods: Explain/Verbal Responses: State content correctly Wound/Skin Impairment: Handouts: Caring for Your Ulcer, Skin Care Do's and Dont's Methods: Explain/Verbal Responses: State content correctly Electronic Signature(s) Signed: 06/30/2015 4:23:47 PM By: [name] Starch, RN, [name] Entered By: [name] Starch, RN, [name] on 06/30/2015 15:51:27 Basil, [name] ([id number]) -------------------------------------------------------------------------------- Wound Assessment Details Patient Name: [name], [name] 06/30/2015 3:00 Date of Service: PM Medical Record [id number] Number: Patient Account Number: [id number] Date of Birth/Sex: 07-Nov-1940 (74 y.o. Male) Treating RN: [name] Primary Care [facility], Other Clinician: Physician: [name] Treating [name], [name], Physician/Extender: Referring Physician: Armida [name] in Treatment: 0 Wound Status Wound Number: 1 Primary Lymphedema Etiology: Wound Location: Left Lower Leg - Lateral Secondary Trauma, Other Wounding Event: Trauma Etiology: Date Acquired: 05/09/2015 Wound Open Weeks Of Treatment: 0 Status: Clustered Wound: No Comorbid Lymphedema, Chronic Obstructive History: Pulmonary Disease (COPD), Sleep Apnea, Congestive Heart Failure, Deep Vein Thrombosis, Hypertension, Seizure Disorder Wound Measurements Length: (cm) 4 Width: (cm) 7.1 Depth: (cm) 1.3 Area: (cm) 22.305 Volume: (cm) 28.997 % Reduction in Area: 7.8% % Reduction in Volume: 20.1% Epithelialization: None Tunneling: No Undermining: No Wound Description Full Thickness Without Exposed Classification: Support Structures Wound Margin: Flat and Intact Exudate Large Amount: Exudate Type: Serous Exudate Color: amber Foul Odor After Cleansing: No Wound Bed Granulation  Amount: Small (1-33%) Exposed Structure Granulation Quality: Red, Hyper-granulation Fascia Exposed: No Necrotic Amount: Large (67-100%) Fat Layer Exposed: No Necrotic Quality: Adherent Slough Tendon Exposed: No Muscle Exposed: No Joint Exposed: No Bone Exposed: No Woodson, Dwon L. ([id number]) Limited to Skin Breakdown Periwound Skin Texture Texture Color No Abnormalities Noted: No No Abnormalities Noted: No Callus: No Atrophie Blanche: No Crepitus: No Cyanosis: No Excoriation: No Ecchymosis: No Fluctuance: No Erythema: Yes Friable: No Erythema Location: Circumferential Induration: No Hemosiderin Staining: Yes Localized Edema: No Mottled: No Rash: No Pallor: No Scarring: No Rubor: No Moisture Temperature / Pain No Abnormalities Noted: No Tenderness on Palpation: Yes Dry / Scaly: No Maceration: Yes Moist: Yes Wound Preparation Ulcer Cleansing: Rinsed/Irrigated with Saline Topical Anesthetic Applied: Other: lidocaine 4%, Treatment Notes Wound #1 (Left, Lateral Lower Leg) 1. Cleansed with: Clean wound with Normal Saline 2. Anesthetic Topical Lidocaine 4% cream to wound bed prior to debridement 3. Peri-wound Care: Skin Prep 4. Dressing Applied: Other dressing (specify in notes) 7. Secured with Henriette Combs to Left Lower Extremity Notes sorbact Electronic Signature(s) Signed: 06/30/2015 3:18:56 PM By: [name] Starch, RN, [name] Entered By: [name] Starch RN, [name] on 06/30/2015 15:18:56 [place], [name] ([id number]) -------------------------------------------------------------------------------- Vitals Details Patient Name: [name], [name] 06/30/2015 3:00 Date of Service: PM Medical Record [id number] Number: Patient Account Number: [id number] Date of Birth/Sex: 09/04/1940 (74 y.o. Male) Treating RN: [name] Primary Care [facility], Other Clinician: Physician: [name] Treating [name], [name], Physician/Extender: Referring  Physician: Soyla Murphy  Weeks in Treatment: 0 Vital Signs Time Taken: 15:08 Temperature (F): 97.8 Height (in): 70 Pulse (bpm): 73 Weight (lbs): 296 Respiratory Rate (breaths/min): 16 Body Mass Index (BMI): 42.5 Blood Pressure (mmHg): 151/77 Reference Range: 80 - 120 mg / dl Pulse Oximetry (%): 161100 Electronic Signature(s) Signed: 06/30/2015 3:10:21 PM By: [name] [place], RN, [name] Entered By: [name] [place]arch[place], RN, [name] on 06/30/2015 15:10:19

## 2015-07-01 NOTE — Progress Notes (Signed)
MORRISON, MCBRYAR ([id number]) Visit Report for 06/30/2015 Chief Complaint Document Details Patient Name: [name], [name] 06/30/2015 3:00 Date of Service: PM Medical Record [id number] Number: Patient Account Number: [id number] Date of Birth/Sex: 1940/12/21 (74 y.o. Male) Treating RN: Leonard Downing Primary Care [facility], Other Clinician: Physician: RUPASHREE Treating Tallen Schnorr, Verl Blalock, Physician/Extender: Referring Physician: Armida Sans in Treatment: 0 Information Obtained from: Patient Chief Complaint Patient presents to the wound care center for a consult due non healing wound. nonhealing wound of the left lower extremity which has been present for about a month and a half. Electronic Signature(s) Signed: 06/30/2015 3:33:47 PM By: Evlyn Kanner MD, FACS Entered By: Evlyn Kanner on 06/30/2015 15:33:47 Boulais, [name] ([id number]) -------------------------------------------------------------------------------- HPI Details Patient Name: [name], [name] 06/30/2015 3:00 Date of Service: PM Medical Record [id number] Number: Patient Account Number: [id number] Date of Birth/Sex: Apr 19, 1941 (74 y.o. Male) Treating RN: Leonard Downing Primary Care [facility], Other Clinician: Physician: RUPASHREE Treating Tajee Savant, Verl Blalock, Physician/Extender: Referring Physician: Armida Sans in Treatment: 0 History of Present Illness Location: left lower extremity wound Quality: Patient reports experiencing a dull pain to affected area(s). Severity: Patient states wound are getting worse. Duration: Patient has had the wound for < 6 weeks prior to presenting for treatment Timing: Pain in wound is Intermittent (comes and goes Context: The wound occurred when the patient had a fall and cut his leg which required several sutures. He is also been on several antibiotics including ciprofloxacin and clindamycin. Modifying Factors: Consults to this date  include:he has had bilateral lower limb lymphedema and has been using lymphedema pumps. Associated Signs and Symptoms: Patient reports having difficulty standing for long periods. HPI Description: 74 year old gentleman who has a history of seizures and schizophrenia with bipolar disorder enhances in a assisted living facility. He also has CHF, COPD, GERD and remote history of DVT. He has been treated by the vascular group at [facility] for bilateral lymphedema and has been using lymphedema pumps. This problem started on September 30 when he had a lacerated wound after a fall. The staples were left in for about 10 days and when the staples were removed the wound opened out. Since then he's had several issues with wound infection and the most recent culture showed a heavy growth of Pseudomonas aeruginosa and Staphylococcus aureus and he has received clindamycin and the most recent past and from today he is on levofloxacin. Addendum:Notes were reviewed from a previous vascular consult at [facility] by Dr. Gilda Crease who saw him on 05/27/2014. He had recommended graduated compression stockings class I and also added lymph pumps to improve the control of the patient's lymphedema. Electronic Signature(s) Signed: 06/30/2015 3:33:59 PM By: Evlyn Kanner MD, FACS Entered By: Evlyn Kanner on 06/30/2015 15:33:59 Bradburn, [name] ([id number]) -------------------------------------------------------------------------------- Physical Exam Details Patient Name: [name], [name] 06/30/2015 3:00 Date of Service: PM Medical Record [id number] Number: Patient Account Number: [id number] Date of Birth/Sex: 09-Mar-1941 (74 y.o. Male) Treating RN: Leonard Downing Primary Care [facility], Other Clinician: Physician: RUPASHREE Treating Sharmane Dame, Verl Blalock, Physician/Extender: Referring Physician: Armida Sans in Treatment: 0 Constitutional . Pulse regular. Respirations normal and  unlabored. Afebrile. . Eyes Nonicteric. Reactive to light. Ears, Nose, Mouth, and Throat Lips, teeth, and gums WNL.Marland Kitchen Moist mucosa without lesions . Neck supple and nontender. No palpable supraclavicular or cervical adenopathy. Normal sized without goiter. Respiratory WNL. No retractions.. Breath sounds WNL, No rubs, rales, rhonchi, or wheeze.. Cardiovascular Heart rhythm and rate regular, no murmur or gallop.. Pedal Pulses WNL. No clubbing, cyanosis  or edema. Lymphatic No adneopathy. No adenopathy. No adenopathy. Musculoskeletal Adexa without tenderness or enlargement.. Digits and nails w/o clubbing, cyanosis, infection, petechiae, ischemia, or inflammatory conditions.. Integumentary (Hair, Skin) No suspicious lesions. No crepitus or fluctuance. No peri-wound warmth or erythema. No masses.Marland Kitchen Psychiatric Judgement and insight Intact.. No evidence of depression, anxiety, or agitation.. Notes the wound looks fairly clean has minimal debris which was washed out with saline and a gauze piece. He has stage II lymphedema which is doing well with Unna's boot. Electronic Signature(s) Signed: 06/30/2015 3:34:52 PM By: Evlyn Kanner MD, FACS Entered By: Evlyn Kanner on 06/30/2015 15:34:52 Pascucci, [name] ([id number]) -------------------------------------------------------------------------------- Physician Orders Details Patient Name: [name], [name] 06/30/2015 3:00 Date of Service: PM Medical Record [id number] Number: Patient Account Number: [id number] Date of Birth/Sex: [birth year] (74 y.o. Male) Treating RN: Leonard Downing Primary Care [facility], Other Clinician: Physician: RUPASHREE Treating Makarios Madlock, Verl Blalock, Physician/Extender: Referring Physician: Armida Sans in Treatment: 0 Verbal / Phone Orders: Yes Clinician: Leonard Downing Read Back and Verified: No Diagnosis Coding Wound Cleansing Wound #1 Left,Lateral Lower Leg o Clean wound with Normal  Saline. Anesthetic Wound #1 Left,Lateral Lower Leg o Topical Lidocaine 4% cream applied to wound bed prior to debridement Primary Wound Dressing Wound #1 Left,Lateral Lower Leg o Other: - Siltec packed into wound Secondary Dressing Wound #1 Left,Lateral Lower Leg o ABD pad Dressing Change Frequency Wound #1 Left,Lateral Lower Leg o Change dressing every week o Other: - Home health to change wrap if it slides down more than 1 inch; caregiver with call HHRN if necessary. Follow-up Appointments Wound #1 Left,Lateral Lower Leg o Return Appointment in 1 week. Edema Control Wound #1 Left,Lateral Lower Leg o Unna Boot to Left Lower Extremity - Pink paste o Elevate legs to the level of the heart and pump ankles as often as possible o Compression Pump: Use compression pump on left lower extremity for 30 minutes, twice daily. Mercado, Lenny L. ([id number]) Additional Orders / Instructions Wound #1 Left,Lateral Lower Leg o Increase protein intake. o Activity as tolerated Home Health Wound #1 Left,Lateral Lower Leg o Continue Home Health Visits - HHRN to be notified to change wrap if it slides more than one inch. Sorbact dressing sent with caregiver. o Home Health Nurse may visit PRN to address patientos wound care needs. o FACE TO FACE ENCOUNTER: MEDICARE and MEDICAID PATIENTS: I certify that this patient is under my care and that I had a face-to-face encounter that meets the physician face-to-face encounter requirements with this patient on this date. The encounter with the patient was in whole or in part for the following MEDICAL CONDITION: (primary reason for Home Healthcare) MEDICAL NECESSITY: I certify, that based on my findings, NURSING services are a medically necessary home health service. HOME BOUND STATUS: I certify that my clinical findings support that this patient is homebound (i.e., Due to illness or injury, pt requires aid of supportive devices  such as crutches, cane, wheelchairs, walkers, the use of special transportation or the assistance of another person to leave their place of residence. There is a normal inability to leave the home and doing so requires considerable and taxing effort. Other absences are for medical reasons / religious services and are infrequent or of short duration when for other reasons). o If current dressing causes regression in wound condition, may D/C ordered dressing product/s and apply Normal Saline Moist Dressing daily until next Wound Healing Center / Other MD appointment. Notify Wound Healing Center of regression in wound condition  at [phone]. o Please direct any NON-WOUND related issues/requests for orders to patient's Primary Care Physician Electronic Signature(s) Signed: 06/30/2015 3:30:45 PM By: Maureen [place]oseboro, RN, [place] Signed: 06/30/2015 4:11:13 PM By: Evlyn KannerBritto, Deen Deguia MD, FACS Entered By: Lucrezia Starchoseboro, RN, Sendra on 06/30/2015 15:30:44 Folse, Doy HutchingJERRY L. ([id number]) -------------------------------------------------------------------------------- Problem List Details Patient Name: [name] DarbyCRAWFORD, Jeancarlos L. 06/30/2015 3:00 Date of Service: PM Medical Record [id number] Number: Patient Account Number: [id number] Date of Birth/Sex: 05-Feb-1941 105(74 y.o. 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 ([id number]) -------------------------------------------------------------------------------- Progress Note Details Patient Name: [name] DarbyCRAWFORD, [name] 06/30/2015 3:00 Date of Service: PM Medical Record [id number] Number: Patient Account Number: [id number] Date of Birth/Sex: 05-Feb-1941 [age] y.o. Male) Treating RN: Leonard Downingose[place], [place] [facility], Other Clinician: Physician: RUPASHREE Treating Shahzad Thomann, Verl BlalockErrol VARADARAJAN, Physician/Extender: Referring Physician: Armida SansUPASHREE Weeks in Treatment: 0 Subjective Chief Complaint Information obtained from Patient Patient presents to the wound care center for a consult due non healing wound. nonhealing wound of the left lower extremity which has been present for about a month and a half. History of Present Illness (HPI) The following HPI elements were documented for the patient's wound: Location: left lower extremity wound Quality: Patient reports experiencing a dull pain to affected area(s). Severity: Patient states wound are getting worse. Duration: Patient has had the wound for < 6 weeks prior to presenting for treatment Timing: Pain in wound is Intermittent (comes and goes Context: The wound occurred when the patient had a fall and cut his leg which required several sutures. He is also been on several antibiotics including ciprofloxacin and clindamycin. Modifying Factors: Consults to this date include:he has had bilateral lower limb lymphedema and has been using lymphedema pumps. Associated Signs and Symptoms: Patient reports having difficulty standing for long periods. 74 year old gentleman who has a history of seizures and schizophrenia with bipolar disorder enhances in a assisted living facility. He also has CHF, COPD, GERD and remote history of DVT. He has  been treated by the vascular group at [facility] for bilateral lymphedema and has been using lymphedema pumps. This problem started on September 30 when he had a lacerated wound after a fall. The staples were left in for about 10 days and when the staples were removed the wound opened out. Since then he's had several issues with wound infection and the most recent culture showed a heavy growth of Pseudomonas aeruginosa and Staphylococcus aureus and he has received clindamycin and the most recent past and from today he is on levofloxacin. Addendum:Notes were reviewed from a previous vascular consult at [facility] by Dr. Gilda CreaseSchnier who saw him on 05/27/2014. He had recommended graduated compression stockings class I and also added lymph pumps to improve the control of the patient's lymphedema. Pfohl, Newel L. ([id number]) Objective Constitutional Pulse regular. Respirations normal and unlabored. Afebrile. Vitals Time Taken: 3:08 PM, Height: 70 in, Weight: 296 lbs, BMI: 42.5, Temperature: 97.8 F, Pulse: 73 bpm, Respiratory Rate: 16 breaths/min, Blood  Pressure: 151/77 mmHg, Pulse Oximetry: 100 %. Eyes Nonicteric. Reactive to light. Ears, Nose, Mouth, and Throat Lips, teeth, and gums WNL.Marland Kitchen Moist mucosa without lesions . Neck supple and nontender. No palpable supraclavicular or cervical adenopathy. Normal sized without goiter. Respiratory WNL. No retractions.. Breath sounds WNL, No rubs, rales, rhonchi, or wheeze.. Cardiovascular Heart rhythm and rate regular, no murmur or gallop.. Pedal Pulses WNL. No clubbing, cyanosis or edema. Lymphatic No adneopathy. No adenopathy. No adenopathy. Musculoskeletal Adexa without tenderness or enlargement.. Digits and nails w/o clubbing, cyanosis, infection, petechiae, ischemia, or inflammatory conditions.Marland Kitchen Psychiatric Judgement and insight Intact.. No evidence of depression, anxiety, or agitation.. General Notes: the wound looks fairly  clean has minimal debris which was washed out with saline and a gauze piece. He has stage II lymphedema which is doing well with Unna's boot. Integumentary (Hair, Skin) No suspicious lesions. No crepitus or fluctuance. No peri-wound warmth or erythema. No masses.. Wound #1 status is Open. Original cause of wound was Trauma. The wound is located on the Left,Lateral Lower Leg. The wound measures 4cm length x 7.1cm width x 1.3cm depth; 22.305cm^2 area and 28.997cm^3 volume. The wound is limited to skin breakdown. There is no tunneling or undermining noted. There is a large amount of serous drainage noted. The wound margin is flat and intact. There is small (1-33%) red granulation within the wound bed. There is a large (67-100%) amount of necrotic tissue within the wound bed including Adherent Slough. The periwound skin appearance exhibited: Maceration, Moist, Hemosiderin Staining, Erythema. The periwound skin appearance did not exhibit: Callus, Crepitus, Excoriation, Fluctuance, Friable, Induration, Localized Edema, Rash, Scarring, Dry/Scaly, Atrophie Blanche, Breach, Moishe L. ([id number]) Cyanosis, Ecchymosis, Mottled, Pallor, Rubor. The surrounding wound skin color is noted with erythema which is circumferential. The periwound has tenderness on palpation. Assessment Active Problems ICD-10 I89.0 - Lymphedema, not elsewhere classified L97.222 - Non-pressure chronic ulcer of left calf with fat layer exposed S81.811A - Laceration without foreign body, right lower leg, initial encounter E66.01 - Morbid (severe) obesity due to excess calories I50.9 - Heart failure, unspecified I have recommended we continue with: 1. Sorbact to be packed into the wound and appropriate bolster being applied and then using an Unna's boot on the left lower extremity. 2. he will continue with his lymphedema pumps 3. Continue elevation and exercise as recommended 4. See me back next week. Plan Wound  Cleansing: Wound #1 Left,Lateral Lower Leg: Clean wound with Normal Saline. Anesthetic: Wound #1 Left,Lateral Lower Leg: Topical Lidocaine 4% cream applied to wound bed prior to debridement Primary Wound Dressing: Wound #1 Left,Lateral Lower Leg: Other: - Siltec packed into wound Secondary Dressing: Wound #1 Left,Lateral Lower Leg: ABD pad Dressing Change Frequency: Wound #1 Left,Lateral Lower Leg: Change dressing every week Other: - Home health to change wrap if it slides down more than 1 inch; caregiver with call HHRN if necessary. Follow-up Appointments: Wound #1 Left,Lateral Lower Leg: Return Appointment in 1 week. SHOUA, RESSLER ([id number]) Edema Control: Wound #1 Left,Lateral Lower Leg: Unna Boot to Left Lower Extremity - Pink paste Elevate legs to the level of the heart and pump ankles as often as possible Compression Pump: Use compression pump on left lower extremity for 30 minutes, twice daily. Additional Orders / Instructions: Wound #1 Left,Lateral Lower Leg: Increase protein intake. Activity as tolerated Home Health: Wound #1 Left,Lateral Lower Leg: Continue Home Health Visits - HHRN to be notified to change wrap if it slides more than one inch. Sorbact dressing sent with caregiver.  Home Health Nurse may visit PRN to address patient s wound care needs. FACE TO FACE ENCOUNTER: MEDICARE and MEDICAID PATIENTS: I certify that this patient is under my care and that I had a face-to-face encounter that meets the physician face-to-face encounter requirements with this patient on this date. The encounter with the patient was in whole or in part for the following MEDICAL CONDITION: (primary reason for Home Healthcare) MEDICAL NECESSITY: I certify, that based on my findings, NURSING services are a medically necessary home health service. HOME BOUND STATUS: I certify that my clinical findings support that this patient is homebound (i.e., Due to illness or injury, pt  requires aid of supportive devices such as crutches, cane, wheelchairs, walkers, the use of special transportation or the assistance of another person to leave their place of residence. There is a normal inability to leave the home and doing so requires considerable and taxing effort. Other absences are for medical reasons / religious services and are infrequent or of short duration when for other reasons). If current dressing causes regression in wound condition, may D/C ordered dressing product/s and apply Normal Saline Moist Dressing daily until next Wound Healing Center / Other MD appointment. Notify Wound Healing Center of regression in wound condition at [phone]. Please direct any NON-WOUND related issues/requests for orders to patient's Primary Care Physician I have recommended we continue with: 1. Sorbact to be packed into the wound and appropriate bolster being applied and then using an Unna's boot on the left lower extremity. 2. he will continue with his lymphedema pumps 3. Continue elevation and exercise as recommended 4. See me back next week. Electronic Signature(s) Signed: 06/30/2015 3:36:05 PM By: Evlyn Kanner MD, FACS Entered By: Evlyn Kanner on 06/30/2015 15:36:05 Lehnen, [name] ([id number]) -------------------------------------------------------------------------------- SuperBill Details Patient Name: [name] Date of Service: 06/30/2015 Medical Record Patient Account Number: [id number] Number: Lucrezia Starch Treating RN: Date of Birth/Sex: 1941/02/18 (74 y.o. Male) [facility], Other Clinician: Physician: RUPASHREE Treating Charlton Boule, Verl Blalock, Physician/Extender: Referring Physician: Armida Sans in Treatment: 0 Diagnosis Coding ICD-10 Codes Code Description I89.0 Lymphedema, not elsewhere classified L97.222 Non-pressure chronic ulcer of left calf with fat layer exposed S81.811A Laceration without foreign  body, right lower leg, initial encounter E66.01 Morbid (severe) obesity due to excess calories I50.9 Heart failure, unspecified Facility Procedures CPT4 Code: [id number] Description: (Facility Use Only) 29580LT - APPLY UNNA BOOT LT Modifier: Quantity: 1 Physician Procedures CPT4 Code Description: [id number] 69629 - WC PHYS LEVEL 3 - EST PT ICD-10 Description Diagnosis I89.0 Lymphedema, not elsewhere classified L97.222 Non-pressure chronic ulcer of left calf with fat layer S81.811A Laceration without foreign body, right lower  leg, init Modifier: exposed ial encounter Quantity: 1 Electronic Signature(s) Signed: 06/30/2015 4:22:09 PM By: Evlyn Kanner MD, FACS Signed: 06/30/2015 4:23:47 PM By: Lucrezia Starch RN, Sendra Previous Signature: 06/30/2015 3:36:18 PM Version By: Evlyn Kanner MD, FACS Entered By: Lucrezia Starch RN, Sendra on 06/30/2015 16:15:17

## 2015-07-08 ENCOUNTER — Encounter: Payer: Medicare Other | Admitting: Surgery

## 2015-07-08 DIAGNOSIS — L97222 Non-pressure chronic ulcer of left calf with fat layer exposed: Secondary | ICD-10-CM | POA: Diagnosis not present

## 2015-07-08 NOTE — Progress Notes (Signed)
[name], [name]. ([id number]) Visit Report for 07/08/2015 Chief Complaint Document Details Patient Name: [name]. 07/08/2015 1:00 Date of Service: PM Medical Record [id number] Number: Patient Account Number: [id number] Date of Birth/Sex: [birth year] (74 y.o. Male) Treating RN: Huel CoventryWoody, Kim [facility], Other Clinician: Physician: RUPASHREE Treating [name], Physician/Extender: Referring Physician: Armida SansUPASHREE Weeks in Treatment: 2 Information Obtained from: Patient Chief Complaint Patient presents to the wound care center for a consult due non healing wound. nonhealing wound of the left lower extremity which has been present for about a month and a half. Electronic Signature(s) Signed: 07/08/2015 1:12:48 PM By: Evlyn KannerBritto, Aideliz Garmany MD, FACS Entered By: Evlyn KannerBritto, Dornell Grasmick on 07/08/2015 13:12:48 Kimura, [name]. ([id number]) -------------------------------------------------------------------------------- HPI Details Patient Name: [name]. 07/08/2015 1:00 Date of Service: PM Medical Record [id number] Number: Patient Account Number: [id number] Date of Birth/Sex: [birth year] [age] y.o. Male) Treating RN: Huel CoventryWoody, Kim [facility] Other Clinician: Physician: RUPASHREE Treating [name], [name], Physician/Extender: Referring Physician: Armida SansUPASHREE Weeks in Treatment: 2 History of Present Illness Location: left lower extremity wound Quality: Patient reports experiencing a dull pain to affected area(s). Severity: Patient states wound are getting worse. Duration: Patient has had the wound for < 6 weeks prior to presenting for treatment Timing: Pain in wound is Intermittent (comes and goes Context: The wound occurred when the patient had a fall and cut his leg which required several sutures. He is also been on several antibiotics including ciprofloxacin and clindamycin. Modifying Factors: Consults to this date include:he has had  bilateral lower limb lymphedema and has been using lymphedema pumps. Associated Signs and Symptoms: Patient reports having difficulty standing for long periods. HPI Description: 74 year old gentleman who has a history of seizures and schizophrenia with bipolar disorder enhances in a assisted living facility. He also has CHF, COPD, GERD and remote history of DVT. He has been treated by the [facility] group [facility] for bilateral lymphedema and has been using lymphedema pumps. This problem started on September 30 when he had a lacerated wound after a fall. The staples were left in for about 10 days and when the staples were removed the wound opened out. Since then he's had several issues with wound infection and the most recent culture showed a heavy growth of Pseudomonas aeruginosa and Staphylococcus aureus and he has received clindamycin and the most recent past and from today he is on levofloxacin. Addendum:Notes were reviewed from a [facility] by Dr. Gilda CreaseSchnier who saw him on 05/27/2014. He had recommended graduated compression stockings class I and also added lymph pumps to improve the control of the patient's lymphedema. 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 Diffuse soft-tissue swelling correlate with bone scan recommended. Electronic Signature(s) Signed: 07/08/2015 1:13:43 PM By: Evlyn KannerBritto, Koltyn Kelsay MD, FACS Previous Signature: 07/08/2015 1:10:26 PM Version By: Evlyn KannerBritto, Asencion Guisinger MD, FACS Entered By: Evlyn KannerBritto, Jadeyn Hargett on 07/08/2015 13:13:43 Portell, [name]. ([id number]) -------------------------------------------------------------------------------- Physical Exam Details Patient Name: [name]. 07/08/2015 1:00 Date of Service: PM Medical Record [id number] Number: Patient Account Number: [id number] Date of Birth/Sex: [birth year] 57(74 y.o.  Male) Treating RN: Huel CoventryWoody, Kim [facility], Other Clinician: Physician: RUPASHREE Treating Dulse Rutan, [name], Physician/Extender: Referring Physician: Armida SansUPASHREE Weeks in Treatment: 2 Constitutional . Pulse regular. Respirations normal and unlabored. Afebrile. . Eyes Nonicteric. Reactive to light. Ears, Nose, Mouth, and Throat Lips, teeth, and gums WNL.Marland Kitchen. Moist mucosa without lesions . Neck supple  and nontender. No palpable supraclavicular or cervical adenopathy. Normal sized without goiter. Respiratory WNL. No retractions.. Cardiovascular Pedal Pulses WNL. No clubbing, cyanosis or edema. Lymphatic No adneopathy. No adenopathy. No adenopathy. Musculoskeletal Adexa without tenderness or enlargement.. Digits and nails w/o clubbing, cyanosis, infection, petechiae, ischemia, or inflammatory conditions.. Integumentary (Hair, Skin) No suspicious lesions. No crepitus or fluctuance. No peri-wound warmth or erythema. No masses.Marland Kitchen Psychiatric Judgement and insight Intact.. No evidence of depression, anxiety, or agitation.. Notes the wound on the left lower extremity looks fairly clean and has minimal debris which was washed out with saline and a gauze piece. He has stage II lymphedema which is doing well with Unna's boot. Electronic Signature(s) Signed: 07/08/2015 1:22:51 PM By: Evlyn Kanner MD, FACS Previous Signature: 07/08/2015 1:14:43 PM Version By: Evlyn Kanner MD, FACS Entered By: Evlyn Kanner on 07/08/2015 13:22:51 [name] ([id number]) -------------------------------------------------------------------------------- Physician Orders Details Patient Name: SEMAJ, COBURN 07/08/2015 1:00 Date of Service: PM Medical Record [id number] Number: Patient Account Number: [id number] Date of Birth/Sex: 09-Oct-1940 (74 y.o. Male) Treating RN: Ashok Cordia, Debi Primary Care [facility], Other Clinician: Physician: RUPASHREE Treating Abdirahim Flavell,  [name] Blalock, Physician/Extender: Referring Physician: Armida Sans in Treatment: 2 Verbal / Phone Orders: Yes ClinicianAshok Cordia, Debi Read Back and Verified: Yes Diagnosis Coding ICD-10 Coding Code Description I89.0 Lymphedema, not elsewhere classified L97.222 Non-pressure chronic ulcer of left calf with fat layer exposed S81.811A Laceration without foreign body, right lower leg, initial encounter E66.01 Morbid (severe) obesity due to excess calories I50.9 Heart failure, unspecified Wound Cleansing Wound #1 Left,Lateral Lower Leg o Clean wound with Normal Saline. Anesthetic Wound #1 Left,Lateral Lower Leg o Topical Lidocaine 4% cream applied to wound bed prior to debridement Primary Wound Dressing Wound #1 Left,Lateral Lower Leg o Other: - Siltec packed into wound Secondary Dressing Wound #1 Left,Lateral Lower Leg o ABD pad Dressing Change Frequency Wound #1 Left,Lateral Lower Leg o Change dressing every week o Other: - Home health to change wrap if it slides down more than 1 inch; caregiver with call HHRN if necessary. Follow-up Appointments Wound #1 Left,Lateral Lower Leg Puga, [name]. ([id number]) o Return Appointment in 1 week. Edema Control Wound #1 Left,Lateral Lower Leg o Unna Boot to Left Lower Extremity - Pink paste o Elevate legs to the level of the heart and pump ankles as often as possible o Compression Pump: Use compression pump on left lower extremity for 30 minutes, twice daily. Additional Orders / Instructions Wound #1 Left,Lateral Lower Leg o Increase protein intake. o Activity as tolerated Home Health Wound #1 Left,Lateral Lower Leg o Continue Home Health Visits - HHRN to be notified to change wrap if it slides more than one inch. Sorbact dressing sent with caregiver. o Home Health Nurse may visit PRN to address patientos wound care needs. o FACE TO FACE ENCOUNTER: MEDICARE and MEDICAID PATIENTS: I  certify that this patient is under my care and that I had a face-to-face encounter that meets the physician face-to-face encounter requirements with this patient on this date. The encounter with the patient was in whole or in part for the following MEDICAL CONDITION: (primary reason for Home Healthcare) MEDICAL NECESSITY: I certify, that based on my findings, NURSING services are a medically necessary home health service. HOME BOUND STATUS: I certify that my clinical findings support that this patient is homebound (i.e., Due to illness or injury, pt requires aid of supportive devices such as crutches, cane, wheelchairs, walkers, the use of special transportation or the assistance of another person  to leave their place of residence. There is a normal inability to leave the home and doing so requires considerable and taxing effort. Other absences are for medical reasons / religious services and are infrequent or of short duration when for other reasons). o If current dressing causes regression in wound condition, may D/C ordered dressing product/s and apply Normal Saline Moist Dressing daily until next Wound Healing Center / Other MD appointment. Notify Wound Healing Center of regression in wound condition at [phone]. o Please direct any NON-WOUND related issues/requests for orders to patient's Primary Care Physician Radiology o Magnetic Resonance Imaging (MRI) - Left lower leg without and with contrast oooo VISHWA, DAIS ([id number]) Electronic Signature(s) Signed: 07/08/2015 5:47:05 PM By: Evlyn Kanner MD, FACS Entered By: Alejandro Mulling on 07/08/2015 13:21:15 Inge, Corie Elbert Ewings ([id number]) -------------------------------------------------------------------------------- Problem List Details Patient Name: [name] 07/08/2015 1:00 Date of Service: PM Medical Record [id number] Number: Patient Account Number: [id number] Date of Birth/Sex: 05-Nov-1940 (74 y.o.  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 with fat layer 06/24/2015 Yes exposed S81.811A Laceration without foreign body, right lower leg, initial 06/24/2015 Yes encounter E66.01 Morbid (severe) obesity due to excess calories 06/24/2015 Yes I50.9 Heart failure, unspecified 06/24/2015 Yes Inactive Problems Resolved Problems Electronic Signature(s) Signed: 07/08/2015 1:12:36 PM By: Evlyn Kanner MD, FACS Entered By: Evlyn Kanner on 07/08/2015 13:12:36 Defalco, [name] ([id number]) -------------------------------------------------------------------------------- Progress Note Details Patient Name: [name] 07/08/2015 1:00 Date of Service: PM Medical Record [id number] Number: Patient Account Number: [id number] Date of Birth/Sex: October 18, 1940 (74 y.o. Male) Treating RN: Huel Coventry Primary Care [facility], Other Clinician: Physician: RUPASHREE Treating Wynton Hufstetler, [name] Blalock, Physician/Extender: Referring Physician: Armida Sans in Treatment: 2 Subjective Chief Complaint Information obtained from Patient Patient presents to the wound care center for a consult due non healing wound. nonhealing wound of the left lower extremity which has been present for about a month and a half. History of Present Illness (HPI) The following HPI elements were documented for the patient's wound: Location: left lower extremity wound Quality: Patient reports experiencing a dull pain to affected area(s). Severity: Patient states wound are getting worse. Duration: Patient has had the wound for < 6 weeks prior to presenting for treatment Timing:  Pain in wound is Intermittent (comes and goes Context: The wound occurred when the patient had a fall and cut his leg which required several sutures. He is also been on several antibiotics including ciprofloxacin and clindamycin. Modifying Factors: Consults to this date include:he has had bilateral lower limb lymphedema and has been using lymphedema pumps. Associated Signs and Symptoms: Patient reports having difficulty standing for long periods. 74 year old gentleman who has a history of seizures and schizophrenia with bipolar disorder enhances in a assisted living facility. He also has CHF, COPD, GERD and remote history of DVT. He has been treated by the vascular group at [facility] for bilateral lymphedema and has been using lymphedema pumps. This problem started on September 30 when he had a lacerated wound after a fall. The staples were left in for about 10 days and when the staples were removed the wound opened out. Since then he's had several issues with wound infection and the most recent culture showed a heavy growth of Pseudomonas aeruginosa and Staphylococcus aureus and he  has received clindamycin and the most recent past and from today he is on levofloxacin. Addendum:Notes were reviewed from a previous vascular consult at [facility] by Dr. Gilda Crease who saw him on 05/27/2014. He had recommended graduated compression stockings class I and also added lymph pumps to improve the control of the patient's lymphedema. 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 Diffuse soft-tissue swelling correlate with bone scan recommended. Frees, Derrius [name]. ([id number]) Objective Constitutional Pulse regular. Respirations normal and unlabored. Afebrile. Vitals Time Taken: 1:07 PM, Height: 70 in, Weight: 296 lbs, BMI: 42.5, Temperature: 97.5 F, Pulse: 85 bpm, Respiratory Rate: 18  breaths/min, Blood Pressure: 139/71 mmHg. Eyes Nonicteric. Reactive to light. Ears, Nose, Mouth, and Throat Lips, teeth, and gums WNL.Marland Kitchen Moist mucosa without lesions . Neck supple and nontender. No palpable supraclavicular or cervical adenopathy. Normal sized without goiter. Respiratory WNL. No retractions.. Cardiovascular Pedal Pulses WNL. No clubbing, cyanosis or edema. Lymphatic No adneopathy. No adenopathy. No adenopathy. Musculoskeletal Adexa without tenderness or enlargement.. Digits and nails w/o clubbing, cyanosis, infection, petechiae, ischemia, or inflammatory conditions.Marland Kitchen Psychiatric Judgement and insight Intact.. No evidence of depression, anxiety, or agitation.. General Notes: the wound on the left lower extremity looks fairly clean and has minimal debris which was washed out with saline and a gauze piece. He has stage II lymphedema which is doing well with Unna's boot. Integumentary (Hair, Skin) No suspicious lesions. No crepitus or fluctuance. No peri-wound warmth or erythema. No masses.. Wound #1 status is Open. Original cause of wound was Trauma. The wound is located on the Left,Lateral Lower Leg. The wound measures 3cm length x 5.9cm width x 0.4cm depth; 13.902cm^2 area and 5.561cm^3 volume. The wound is limited to skin breakdown. There is no tunneling or undermining noted. There is a large amount of serosanguineous drainage noted. The wound margin is flat and intact. There is small (1-33%) red granulation within the wound bed. There is a medium (34-66%) amount of necrotic tissue Coppock, Piper [name]. ([id number]) within the wound bed including Adherent Slough. The periwound skin appearance exhibited: Maceration, Moist, Hemosiderin Staining, Erythema. The periwound skin appearance did not exhibit: Callus, Crepitus, Excoriation, Fluctuance, Friable, Induration, Localized Edema, Rash, Scarring, Dry/Scaly, Atrophie Blanche, Cyanosis, Ecchymosis, Mottled, Pallor, Rubor. The  surrounding wound skin color is noted with erythema which is circumferential. The periwound has tenderness on palpation. 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 In view of this finding I have recommended an MRI of the left lower extremity. I have also recommended Sorbact to be packed into the wound and appropriate bolster being applied and then using an Unna's boot on the left lower extremity. He will continue to use his lymphedema pump twice daily and see me back next week. Plan Wound Cleansing: Wound #1 Left,Lateral Lower Leg: Clean wound with Normal Saline. Anesthetic: Wound #1 Left,Lateral Lower Leg: Topical Lidocaine 4% cream applied to wound bed prior to debridement Primary Wound Dressing: Wound #1 Left,Lateral Lower Leg: Other: - Siltec packed into wound Secondary Dressing: Wound #1 Left,Lateral Lower Leg: ABD pad  Dressing Change Frequency: JOSPH, NORFLEET. ([id number]) Wound #1 Left,Lateral Lower Leg: Change dressing every week Other: - Home health to change wrap if it slides down more than 1 inch; caregiver with call HHRN if necessary. Follow-up Appointments: Wound #1 Left,Lateral Lower Leg: Return Appointment in 1 week. Edema Control: Wound #1 Left,Lateral Lower Leg: Unna Boot to Left Lower Extremity - Pink paste Elevate legs to the level of the heart and pump ankles as often as possible Compression Pump: Use compression pump on left lower extremity for 30 minutes, twice daily. Additional Orders / Instructions: Wound #1 Left,Lateral  Lower Leg: Increase protein intake. Activity as tolerated Home Health: Wound #1 Left,Lateral Lower Leg: Continue Home Health Visits - HHRN to be notified to change wrap if it slides more than one inch. Sorbact dressing sent with caregiver. Home Health Nurse may visit PRN to address patient s wound care needs. FACE TO FACE ENCOUNTER: MEDICARE and MEDICAID PATIENTS: I certify that this patient is under my care and that I had a face-to-face encounter that meets the physician face-to-face encounter requirements with this patient on this date. The encounter with the patient was in whole or in part for the following MEDICAL CONDITION: (primary reason for Home Healthcare) MEDICAL NECESSITY: I certify, that based on my findings, NURSING services are a medically necessary home health service. HOME BOUND STATUS: I certify that my clinical findings support that this patient is homebound (i.e., Due to illness or injury, pt requires aid of supportive devices such as crutches, cane, wheelchairs, walkers, the use of special transportation or the assistance of another person to leave their place of residence. There is a normal inability to leave the home and doing so requires considerable and taxing effort. Other absences are for medical reasons / religious services and are infrequent or of short duration when for other reasons). If current dressing causes regression in wound condition, may D/C ordered dressing product/s and apply Normal Saline Moist Dressing daily until next Wound Healing Center / Other MD appointment. Notify Wound Healing Center of regression in wound condition at [phone]. Please direct any NON-WOUND related issues/requests for orders to patient's Primary Care Physician Radiology ordered were: Magnetic Resonance Imaging (MRI) - Left lower leg without and with contrast Though this is a superficial wound, his PCP had recommended a plain film of the left lower leg and this shows a  questionable osteomyelitis of the tibia. In view of this finding I have recommended an MRI of the left lower extremity. I have also recommended Sorbact to be packed into the wound and appropriate bolster being applied and then using an Unna's boot on the left lower extremity. Victorio, Bodie [name]. ([id number]) He will continue to use his lymphedema pump twice daily and see me back next week. Electronic Signature(s) Signed: 07/08/2015 1:25:06 PM By: Evlyn Kanner MD, FACS Entered By: Evlyn Kanner on 07/08/2015 13:25:06 Zagal, [name] ([id number]) -------------------------------------------------------------------------------- SuperBill Details Patient Name: [name] Date of Service: 07/08/2015 Medical Record Number: [id number] Patient Account Number: [id number] Date of Birth/Sex: 1940/08/16 (74 y.o. Male) Treating RN: Huel Coventry Primary Care Physician: Lorenda Ishihara Other Clinician: Referring Physician: Lorenda Ishihara Treating Physician/Extender: Rudene Re in Treatment: 2 Diagnosis Coding ICD-10 Codes Code Description I89.0 Lymphedema, not elsewhere classified L97.222 Non-pressure chronic ulcer of left calf with fat layer exposed S81.811A Laceration without foreign body, right lower leg, initial encounter E66.01 Morbid (severe) obesity due to excess calories I50.9 Heart failure, unspecified Facility Procedures CPT4 Code: [id number] Description: (Facility Use Only)  [id number] - APPLY UNNA BOOT LT Modifier: Quantity: 1 Physician Procedures CPT4 Code Description: [id number] - WC PHYS LEVEL 3 - EST PT ICD-10 Description Diagnosis I89.0 Lymphedema, not elsewhere classified 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

## 2015-07-15 ENCOUNTER — Encounter: Payer: Medicare Other | Attending: Surgery | Admitting: Surgery

## 2015-07-15 DIAGNOSIS — X58XXXA Exposure to other specified factors, initial encounter: Secondary | ICD-10-CM | POA: Insufficient documentation

## 2015-07-15 DIAGNOSIS — I509 Heart failure, unspecified: Secondary | ICD-10-CM | POA: Diagnosis not present

## 2015-07-15 DIAGNOSIS — I890 Lymphedema, not elsewhere classified: Secondary | ICD-10-CM | POA: Diagnosis not present

## 2015-07-15 DIAGNOSIS — F319 Bipolar disorder, unspecified: Secondary | ICD-10-CM | POA: Insufficient documentation

## 2015-07-15 DIAGNOSIS — F209 Schizophrenia, unspecified: Secondary | ICD-10-CM | POA: Diagnosis not present

## 2015-07-15 DIAGNOSIS — S81811A Laceration without foreign body, right lower leg, initial encounter: Secondary | ICD-10-CM | POA: Diagnosis not present

## 2015-07-15 DIAGNOSIS — L97222 Non-pressure chronic ulcer of left calf with fat layer exposed: Secondary | ICD-10-CM | POA: Diagnosis present

## 2015-07-15 DIAGNOSIS — K219 Gastro-esophageal reflux disease without esophagitis: Secondary | ICD-10-CM | POA: Insufficient documentation

## 2015-07-15 DIAGNOSIS — J449 Chronic obstructive pulmonary disease, unspecified: Secondary | ICD-10-CM | POA: Diagnosis not present

## 2015-07-16 ENCOUNTER — Other Ambulatory Visit: Payer: Self-pay | Admitting: Surgery

## 2015-07-16 DIAGNOSIS — M869 Osteomyelitis, unspecified: Secondary | ICD-10-CM

## 2015-07-16 NOTE — Progress Notes (Signed)
[name] ([id number]) Visit Report for 07/15/2015 Chief Complaint Document Details Patient Name: [name], [name] 07/15/2015 2:45 Date of Service: PM Medical Record [id number] Number: Patient Account Number: [id number] Date of Birth/Sex: May 30, 1941 (74 y.o. Male) Treating RN: Ashok Cordia, Debi Primary Care [facility], Other Clinician: Physician: RUPASHREE Treating Terricka Onofrio, Verl Blalock, Physician/Extender: Referring Physician: Armida Sans in Treatment: 3 Information Obtained from: Patient Chief Complaint Patient presents to the wound care center for a consult due non healing wound. nonhealing wound of the left lower extremity which has been present for about a month and a half. Electronic Signature(s) Signed: 07/15/2015 3:30:51 PM By: Evlyn Kanner MD, FACS Entered By: Evlyn Kanner on 07/15/2015 15:30:51 [name], [name] ([id number]) -------------------------------------------------------------------------------- HPI Details Patient Name: [name], [name] 07/15/2015 2:45 Date of Service: PM Medical Record [id number] Number: Patient Account Number: [id number] Date of Birth/Sex: 1940-11-23 (74 y.o. Male) Treating RN: Ashok Cordia, Debi Primary Care [facility], Other Clinician: Physician: RUPASHREE Treating Merita Hawks, Verl Blalock, Physician/Extender: Referring Physician: Armida Sans in Treatment: 3 History of Present Illness Location: left lower extremity wound Quality: Patient reports experiencing a dull pain to affected area(s). Severity: Patient states wound are getting worse. Duration: Patient has had the wound for < 6 weeks prior to presenting for treatment Timing: Pain in wound is Intermittent (comes and goes Context: The wound occurred when the patient had a fall and cut his leg which required several sutures. He is also been on several antibiotics including ciprofloxacin and clindamycin. Modifying Factors: Consults to this date include:he has  had bilateral lower limb lymphedema and has been using lymphedema pumps. Associated Signs and Symptoms: Patient reports having difficulty standing for long periods. HPI Description: 73 year old gentleman who has a history of seizures and schizophrenia with bipolar disorder enhances in a assisted living facility. He also has CHF, COPD, GERD and remote history of DVT. He has been treated by the vascular group at [facility] for bilateral lymphedema and has been using lymphedema pumps. This problem started on September 30 when he had a lacerated wound after a fall. The staples were left in for about 10 days and when the staples were removed the wound opened out. Since then he's had several issues with wound infection and the most recent culture showed a heavy growth of Pseudomonas aeruginosa and Staphylococcus aureus and he has received clindamycin and the most recent past and from today he is on levofloxacin. Addendum:Notes were reviewed from a previous vascular consult at [facility] by Dr. Gilda Crease who saw him on 05/27/2014. He had recommended graduated compression stockings class I and also added lymph pumps to improve the control of the patient's lymphedema. 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 Diffuse soft-tissue swelling correlate with bone scan recommended. 07/15/2015 -- he is still awaiting insurance clearance for his MRI and other than that he has no other changes. Electronic Signature(s) Signed: 07/15/2015 3:31:10 PM By: Evlyn Kanner MD, FACS Entered By: Evlyn Kanner on 07/15/2015 15:31:10 [name], [name] ([id number]) -------------------------------------------------------------------------------- Physical Exam Details Patient Name: [name], [name] 07/15/2015 2:45 Date of Service: PM Medical Record [id number] Number: Patient Account Number:  [id number] Date of Birth/Sex: 02/16/1941 (74 y.o. Male) Treating RN: Ashok Cordia, Debi Primary Care [facility], Other Clinician: Physician: RUPASHREE Treating Sophiarose Eades, Verl Blalock, Physician/Extender: Referring Physician: Armida Sans in Treatment: 3 Constitutional . Pulse regular. Respirations normal and unlabored. Afebrile. . Eyes Nonicteric. Reactive to light. Ears, Nose, Mouth, and Throat Lips, teeth, and  gums WNL.Marland Kitchen Moist mucosa without lesions . Neck supple and nontender. No palpable supraclavicular or cervical adenopathy. Normal sized without goiter. Respiratory WNL. No retractions.. Cardiovascular Pedal Pulses WNL. No clubbing, cyanosis or edema. Chest Breasts symmetical and no nipple discharge.. Breast tissue WNL, no masses, lumps, or tenderness.. Lymphatic No adneopathy. No adenopathy. No adenopathy. Musculoskeletal Adexa without tenderness or enlargement.. Digits and nails w/o clubbing, cyanosis, infection, petechiae, ischemia, or inflammatory conditions.. Integumentary (Hair, Skin) No suspicious lesions. No crepitus or fluctuance. No peri-wound warmth or erythema. No masses.Marland Kitchen Psychiatric Judgement and insight Intact.. No evidence of depression, anxiety, or agitation.. Notes the wound on the left lower extremity is looking very clean and has healthy granulation tissue. Electronic Signature(s) Signed: 07/15/2015 3:32:22 PM By: Evlyn Kanner MD, FACS Entered By: Evlyn Kanner on 07/15/2015 15:32:22 [name], [name] ([id number]) -------------------------------------------------------------------------------- Physician Orders Details Patient Name: [name], [name] 07/15/2015 2:45 Date of Service: PM Medical Record [id number] Number: Patient Account Number: [id number] Date of Birth/Sex: 02-Feb-1941 (74 y.o. Male) Treating RN: Ashok Cordia, Debi Primary Care [facility], Other Clinician: Physician: RUPASHREE Treating Clair Alfieri, Verl Blalock,  Physician/Extender: Referring Physician: Armida Sans in Treatment: 3 Verbal / Phone Orders: Yes ClinicianAshok Cordia, Debi Read Back and Verified: Yes Diagnosis Coding Wound Cleansing Wound #1 Left,Lateral Lower Leg o Clean wound with Normal Saline. Anesthetic Wound #1 Left,Lateral Lower Leg o Topical Lidocaine 4% cream applied to wound bed prior to debridement Primary Wound Dressing Wound #1 Left,Lateral Lower Leg o Other: - sorbact with hydrogel Secondary Dressing Wound #1 Left,Lateral Lower Leg o ABD pad Dressing Change Frequency Wound #1 Left,Lateral Lower Leg o Change dressing every week o Other: - Home health to change wrap if it slides down more than 1 inch; caregiver with call HHRN if necessary. Follow-up Appointments Wound #1 Left,Lateral Lower Leg o Return Appointment in 1 week. Edema Control Wound #1 Left,Lateral Lower Leg o Unna Boot to Left Lower Extremity - Pink paste o Elevate legs to the level of the heart and pump ankles as often as possible o Compression Pump: Use compression pump on left lower extremity for 30 minutes, twice daily. [name], [name] ([id number]) Additional Orders / Instructions Wound #1 Left,Lateral Lower Leg o Increase protein intake. o Activity as tolerated Home Health Wound #1 Left,Lateral Lower Leg o Continue Home Health Visits - HHRN to be notified to change wrap if it slides more than one inch. Sorbact dressing sent with caregiver. o Home Health Nurse may visit PRN to address patientos wound care needs. o FACE TO FACE ENCOUNTER: MEDICARE and MEDICAID PATIENTS: I certify that this patient is under my care and that I had a face-to-face encounter that meets the physician face-to-face encounter requirements with this patient on this date. The encounter with the patient was in whole or in part for the following MEDICAL CONDITION: (primary reason for Home Healthcare) MEDICAL NECESSITY: I certify,  that based on my findings, NURSING services are a medically necessary home health service. HOME BOUND STATUS: I certify that my clinical findings support that this patient is homebound (i.e., Due to illness or injury, pt requires aid of supportive devices such as crutches, cane, wheelchairs, walkers, the use of special transportation or the assistance of another person to leave their place of residence. There is a normal inability to leave the home and doing so requires considerable and taxing effort. Other absences are for medical reasons / religious services and are infrequent or of short duration when for other reasons). o If current dressing causes regression in  wound condition, may [name]/C ordered dressing product/s and apply Normal Saline Moist Dressing daily until next Wound Healing Center / Other MD appointment. Notify Wound Healing Center of regression in wound condition at [phone]. o Please direct any NON-WOUND related issues/requests for orders to patient's Primary Care Physician Electronic Signature(s) Signed: 07/15/2015 4:34:28 PM By: Evlyn Kanner MD, FACS Signed: 07/15/2015 6:00:56 PM By: Alejandro Mulling Entered By: Alejandro Mulling on 07/15/2015 15:28:23 [name], [name] ([id number]) -------------------------------------------------------------------------------- Problem List Details Patient Name: [name], [name] 07/15/2015 2:45 Date of Service: PM Medical Record [id number] Number: Patient Account Number: [id number] Date of Birth/Sex: March 23, 1941 (74 y.o. 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 S81.811A Laceration without foreign body, right lower leg, initial 06/24/2015 Yes encounter E66.01 Morbid (severe) obesity due to excess calories 06/24/2015 Yes I50.9 Heart failure, unspecified 06/24/2015 Yes Inactive Problems Resolved Problems Electronic Signature(s) Signed: 07/15/2015 3:30:35 PM By: Evlyn Kanner MD, FACS Entered By: Evlyn Kanner on 07/15/2015 15:30:34 [name], Jamonta Elbert Ewings ([id number]) -------------------------------------------------------------------------------- Progress Note Details Patient Name: [name], [name] 07/15/2015 2:45 Date of Service: PM Medical Record [id number] Number: Patient Account Number: [id number] Date of Birth/Sex: September 20, 1940 (74 y.o. Male) Treating RN: Phillis Haggis Primary Care [facility], Other Clinician: Physician: RUPASHREE Treating Mariluz Crespo, Verl Blalock, Physician/Extender: Referring Physician: Armida Sans in Treatment: 3 Subjective Chief Complaint Information obtained from Patient Patient presents to the wound care center for a consult due non healing wound. nonhealing wound of the left lower extremity which has been present for about a month and a half. History of Present Illness (HPI) The following HPI elements were documented for the patient's wound: Location: left lower extremity wound Quality: Patient reports experiencing a dull pain to affected area(s). Severity: Patient states wound are getting worse. Duration: Patient has had the wound for < 6 weeks prior to presenting for treatment Timing: Pain in wound is Intermittent (comes and goes Context: The wound occurred when the patient had a fall and cut his leg which required several sutures. He is also been on several antibiotics including ciprofloxacin and clindamycin. Modifying Factors: Consults to this date include:he has had bilateral lower limb lymphedema and has been using lymphedema pumps. Associated Signs and Symptoms: Patient  reports having difficulty standing for long periods. 74 year old gentleman who has a history of seizures and schizophrenia with bipolar disorder enhances in a assisted living facility. He also has CHF, COPD, GERD and remote history of DVT. He has been treated by the vascular group at [facility] for bilateral lymphedema and has been using lymphedema pumps. This problem started on September 30 when he had a lacerated wound after a fall. The staples were left in for about 10 days and when the staples were removed the wound opened out. Since then he's had several issues with wound infection and the most recent culture showed a heavy growth of Pseudomonas aeruginosa and Staphylococcus aureus and he has received clindamycin and the most recent past and from today he is on levofloxacin. Addendum:Notes were reviewed from a previous vascular consult at [facility] by Dr. Gilda Crease who saw him on 05/27/2014. He had recommended graduated compression stockings class I and also added lymph pumps to improve the control of the patient's lymphedema. 07/08/2015 -- x-ray of the left leg  done on 07/01/2015 -- Impression is: suspected osteomyelitis with cortical irregularity involving the mid tibia. Diffuse soft-tissue swelling correlate with bone scan recommended. 07/15/2015 -- he is still awaiting insurance clearance for his MRI and other than that he has no other changes. Scharnhorst, [name] ([id number]) Objective Constitutional Pulse regular. Respirations normal and unlabored. Afebrile. Vitals Time Taken: 2:52 PM, Height: 70 in, Weight: 296 lbs, BMI: 42.5, Temperature: 97.6 F, Pulse: 79 bpm, Respiratory Rate: 18 breaths/min, Blood Pressure: 162/89 mmHg. Eyes Nonicteric. Reactive to light. Ears, Nose, Mouth, and Throat Lips, teeth, and gums WNL.Marland Kitchen. Moist mucosa without lesions . Neck supple and nontender. No palpable supraclavicular or cervical adenopathy. Normal sized without  goiter. Respiratory WNL. No retractions.. Cardiovascular Pedal Pulses WNL. No clubbing, cyanosis or edema. Chest Breasts symmetical and no nipple discharge.. Breast tissue WNL, no masses, lumps, or tenderness.. Lymphatic No adneopathy. No adenopathy. No adenopathy. Musculoskeletal Adexa without tenderness or enlargement.. Digits and nails w/o clubbing, cyanosis, infection, petechiae, ischemia, or inflammatory conditions.Marland Kitchen. Psychiatric Judgement and insight Intact.. No evidence of depression, anxiety, or agitation.. General Notes: the wound on the left lower extremity is looking very clean and has healthy granulation tissue. Integumentary (Hair, Skin) No suspicious lesions. No crepitus or fluctuance. No peri-wound warmth or erythema. No masses.[name] Dupre. [name], [name] HutchingJERRY L. ([id number]) Wound #1 status is Open. Original cause of wound was Trauma. The wound is located on the Left,Lateral Lower Leg. The wound measures 2.7cm length x 6cm width x 0.4cm depth; 12.723cm^2 area and 5.089cm^3 volume. The wound is limited to skin breakdown. There is no tunneling or undermining noted. There is a large amount of serosanguineous drainage noted. The wound margin is flat and intact. There is small (1-33%) red granulation within the wound bed. There is a small (1-33%) amount of necrotic tissue within the wound bed including Adherent Slough. The periwound skin appearance exhibited: Localized Edema, Maceration, Moist, Hemosiderin Staining, Erythema. The periwound skin appearance did not exhibit: Callus, Crepitus, Excoriation, Fluctuance, Friable, Induration, Rash, Scarring, Dry/Scaly, Atrophie Blanche, Cyanosis, Ecchymosis, Mottled, Pallor, Rubor. The surrounding wound skin color is noted with erythema which is circumferential. Periwound temperature was noted as No Abnormality. The periwound has tenderness on palpation. Assessment Active Problems ICD-10 I89.0 - Lymphedema, not elsewhere classified L97.222 -  Non-pressure chronic ulcer of left calf with fat layer exposed S81.811A - Laceration without foreign body, right lower leg, initial encounter E66.01 - Morbid (severe) obesity due to excess calories I50.9 - Heart failure, unspecified Plan Wound Cleansing: Wound #1 Left,Lateral Lower Leg: Clean wound with Normal Saline. Anesthetic: Wound #1 Left,Lateral Lower Leg: Topical Lidocaine 4% cream applied to wound bed prior to debridement Primary Wound Dressing: Wound #1 Left,Lateral Lower Leg: Other: - sorbact with hydrogel Secondary Dressing: Wound #1 Left,Lateral Lower Leg: ABD pad Dressing Change Frequency: Wound #1 Left,Lateral Lower Leg: Change dressing every week Other: - Home health to change wrap if it slides down more than 1 inch; caregiver with call HHRN if necessary. Cephas DarbyCRAWFORD, Jamaine L. ([id number]) Follow-up Appointments: Wound #1 Left,Lateral Lower Leg: Return Appointment in 1 week. Edema Control: Wound #1 Left,Lateral Lower Leg: Unna Boot to Left Lower Extremity - Pink paste Elevate legs to the level of the heart and pump ankles as often as possible Compression Pump: Use compression pump on left lower extremity for 30 minutes, twice daily. Additional Orders / Instructions: Wound #1 Left,Lateral Lower Leg: Increase protein intake. Activity as tolerated Home Health: Wound #1 Left,Lateral Lower Leg: Continue Home Health Visits - HHRN to be notified to  change wrap if it slides more than one inch. Sorbact dressing sent with caregiver. Home Health Nurse may visit PRN to address patient s wound care needs. FACE TO FACE ENCOUNTER: MEDICARE and MEDICAID PATIENTS: I certify that this patient is under my care and that I had a face-to-face encounter that meets the physician face-to-face encounter requirements with this patient on this date. The encounter with the patient was in whole or in part for the following MEDICAL CONDITION: (primary reason for Home Healthcare) MEDICAL  NECESSITY: I certify, that based on my findings, NURSING services are a medically necessary home health service. HOME BOUND STATUS: I certify that my clinical findings support that this patient is homebound (i.e., Due to illness or injury, pt requires aid of supportive devices such as crutches, cane, wheelchairs, walkers, the use of special transportation or the assistance of another person to leave their place of residence. There is a normal inability to leave the home and doing so requires considerable and taxing effort. Other absences are for medical reasons / religious services and are infrequent or of short duration when for other reasons). If current dressing causes regression in wound condition, may [name]/C ordered dressing product/s and apply Normal Saline Moist Dressing daily until next Wound Healing Center / Other MD appointment. Notify Wound Healing Center of regression in wound condition at [phone]. Please direct any NON-WOUND related issues/requests for orders to patient's Primary Care Physician I have also recommended Sorbact to be packed into the wound and appropriate bolster being applied and then using an Unna's boot on the left lower extremity. He will continue to use his lymphedema pump twice daily and see me back next week. We are awaiting his MRI authorization and when it's done he can remove his Unna's boot if need be. He will see [place] back next week. Electronic Signature(s) JSHON, IBE ([id number]) Signed: 07/15/2015 3:33:14 PM By: Evlyn Kanner MD, FACS Entered By: Evlyn Kanner on 07/15/2015 15:33:14 Mcginness, [name] ([id number]) -------------------------------------------------------------------------------- SuperBill Details Patient Name: [name] Date of Service: 07/15/2015 Medical Record Patient Account Number: [id number] Number: Ashok Cordia Treating RN: Date of Birth/Sex: 1941/07/21 (74 y.o. Male) Artemio Aly, Other  Clinician: Primary Care Physician: RUPASHREE Treating Audrinna Sherman, Verl Blalock, Physician/Extender: Referring Physician: Armida Sans in Treatment: 3 Diagnosis Coding ICD-10 Codes Code Description I89.0 Lymphedema, not elsewhere classified L97.222 Non-pressure chronic ulcer of left calf with fat layer exposed S81.811A Laceration without foreign body, right lower leg, initial encounter E66.01 Morbid (severe) obesity due to excess calories I50.9 Heart failure, unspecified Facility Procedures CPT4 Code: [id number] Description: 62952 - WOUND CARE VISIT-LEV 2 EST PT Modifier: Quantity: 1 Physician Procedures CPT4 Code Description: [id number] 01027 - WC PHYS LEVEL 3 - EST PT ICD-10 Description Diagnosis I89.0 Lymphedema, not elsewhere classified L97.222 Non-pressure chronic ulcer of left calf with fat layer S81.811A Laceration without foreign body, right lower  leg, init E66.01 Morbid (severe) obesity due to excess calories Modifier: exposed ial encounter Quantity: 1 Electronic Signature(s) Signed: 07/15/2015 3:33:30 PM By: Evlyn Kanner MD, FACS Entered By: Evlyn Kanner on 07/15/2015 15:33:29

## 2015-07-16 NOTE — Progress Notes (Signed)
[name], [name] ([id number]) Visit Report for 07/15/2015 Arrival Information Details Patient Name: [name], [name] 07/15/2015 2:45 Date of Service: PM Medical Record [id number] Number: Patient Account Number: [id number] Date of Birth/Sex: 15-Jan-1941 (74 y.o. Male) Treating RN: [name], [name] Primary Care [facility], Other Clinician: Physician: [name] Treating [name], Physician/Extender: Referring Physician: Armida [name] in Treatment: 3 Visit Information History Since Last Visit All ordered tests and consults were completed: No Patient Arrived: [name] Added or deleted any medications: No Arrival Time: 14:51 Any new allergies or adverse reactions: No Accompanied By: caregiver/staff Had a fall or experienced change in No Transfer Assistance: EasyPivot Patient activities of daily living that may affect Lift risk of falls: Patient Identification Verified: Yes Signs or symptoms of abuse/neglect since last No Secondary Verification Process Yes visito Completed: Hospitalized since last visit: No Patient Requires Transmission- Yes Pain Present Now: Yes Based Precautions: Transmission-Based Contact MRSA Precautions: Patient Has Alerts: Yes Patient Alerts: NOT Diabetic [facility] schizophrenia and bipolar Electronic Signature(s) Signed: 07/15/2015 6:00:56 PM By: [name] Entered By: [name] on 07/15/2015 14:51:36 [name] ([id number]) -------------------------------------------------------------------------------- Clinic Level of Care Assessment Details Patient Name: [name], [name] 07/15/2015 2:45 Date of Service: PM Medical Record [id number] Number: Patient Account Number: [id number] Date of Birth/Sex: Oct 19, 1940 (74 y.o. Male) Treating RN: [name], [name] Primary Care [facility], Other Clinician: Physician: [name] Treating [name], Physician/Extender: Referring  Physician: Armida [name] in Treatment: 3 Clinic Level of Care Assessment Items TOOL 4 Quantity Score []  - Use when only an EandM is performed on FOLLOW-UP visit 0 ASSESSMENTS - Nursing Assessment / Reassessment []  - Reassessment of Co-morbidities (includes updates in patient status) 0 X - Reassessment of Adherence to Treatment Plan 1 5 ASSESSMENTS - Wound and Skin Assessment / Reassessment X - Simple Wound Assessment / Reassessment - one wound 1 5 []  - Complex Wound Assessment / Reassessment - multiple wounds 0 []  - Dermatologic / Skin Assessment (not related to wound area) 0 ASSESSMENTS - Focused Assessment []  - Circumferential Edema Measurements - multi extremities 0 []  - Nutritional Assessment / Counseling / Intervention 0 []  - Lower Extremity Assessment (monofilament, tuning fork, pulses) 0 []  - Peripheral Arterial Disease Assessment (using hand held doppler) 0 ASSESSMENTS - Ostomy and/or Continence Assessment and Care []  - Incontinence Assessment and Management 0 []  - Ostomy Care Assessment and Management (repouching, etc.) 0 PROCESS - Coordination of Care X - Simple Patient / Family Education for ongoing care 1 15 []  - Complex (extensive) Patient / Family Education for ongoing care 0 []  - Staff obtains Consents, Records, Test Results / Process Orders 0 [name], [name] ([id number]) []  - Staff telephones HHA, Nursing Homes / Clarify orders / etc 0 []  - Routine Transfer to another Facility (non-emergent condition) 0 []  - Routine Hospital Admission (non-emergent condition) 0 []  - New Admissions / Manufacturing engineer / Ordering NPWT, Apligraf, etc. 0 []  - Emergency Hospital Admission (emergent condition) 0 X - Simple Discharge Coordination 1 10 []  - Complex (extensive) Discharge Coordination 0 PROCESS - Special Needs []  - Pediatric / Minor Patient Management 0 []  - Isolation Patient Management 0 []  - Hearing / Language / Visual special needs 0 []  - Assessment of  Community assistance (transportation, D/C planning, etc.) 0 []  - Additional assistance / Altered mentation 0 []  - Support Surface(s) Assessment (bed, cushion, seat, etc.) 0 INTERVENTIONS - Wound Cleansing / Measurement X - Simple Wound Cleansing - one wound 1 5 []  - Complex Wound Cleansing -  multiple wounds 0 X - Wound Imaging (photographs - any number of wounds) 1 5 []  - Wound Tracing (instead of photographs) 0 X - Simple Wound Measurement - one wound 1 5 []  - Complex Wound Measurement - multiple wounds 0 INTERVENTIONS - Wound Dressings []  - Small Wound Dressing one or multiple wounds 0 X - Medium Wound Dressing one or multiple wounds 1 15 []  - Large Wound Dressing one or multiple wounds 0 []  - Application of Medications - topical 0 []  - Application of Medications - injection 0 [name], [name] ([id number]) INTERVENTIONS - Miscellaneous []  - External ear exam 0 []  - Specimen Collection (cultures, biopsies, blood, body fluids, etc.) 0 []  - Specimen(s) / Culture(s) sent or taken to Lab for analysis 0 []  - Patient Transfer (multiple staff / Michiel Sites Lift / Similar devices) 0 []  - Simple Staple / Suture removal (25 or less) 0 []  - Complex Staple / Suture removal (26 or more) 0 []  - Hypo / Hyperglycemic Management (close monitor of Blood Glucose) 0 []  - Ankle / Brachial Index (ABI) - do not check if billed separately 0 X - Vital Signs 1 5 Has the patient been seen at the hospital within the last three years: Yes Total Score: 70 Level Of Care: New/Established - Level 2 Electronic Signature(s) Signed: 07/15/2015 6:00:56 PM By: [name] Entered By: [name] on 07/15/2015 15:29:32 [name] ([id number]) -------------------------------------------------------------------------------- Encounter Discharge Information Details Patient Name: [name], [name] 07/15/2015 2:45 Date of Service: PM Medical Record [id number] Number: Patient Account Number: [id number] Date  of Birth/Sex: 29-Aug-1940 (74 y.o. Male) Treating RN: [name], [name] Primary Care [facility], Other Clinician: Physician: [name] Treating [name], Physician/Extender: Referring Physician: Armida [name] in Treatment: 3 Encounter Discharge Information Items Discharge Pain Level: 0 Discharge Condition: Stable Ambulatory Status: Walker Discharge Destination: Nursing Home Transportation: Private Auto Accompanied By: caregiver/staff Schedule Follow-up Appointment: Yes Medication Reconciliation completed and provided to Yes Patient/Care Carold Eisner: Provided on Clinical Summary of Care: 07/15/2015 Form Type Recipient Paper Patient JC Electronic Signature(s) Signed: 07/15/2015 3:36:17 PM By: Gwenlyn Perking Entered By: Gwenlyn Perking on 07/15/2015 15:36:17 Forse, [name] ([id number]) -------------------------------------------------------------------------------- Lower Extremity Assessment Details Patient Name: [name], [name] 07/15/2015 2:45 Date of Service: PM Medical Record [id number] Number: Patient Account Number: [id number] Date of Birth/Sex: Jul 06, 1941 (74 y.o. Male) Treating RN: [name], [name] Primary Care [facility], Other Clinician: Physician: [name] Treating [name], Physician/Extender: Referring Physician: Armida [name] in Treatment: 3 Edema Assessment Assessed: [Left: No] [Right: No] E[Left: dema] [Right: :] Calf Left: Right: Point of Measurement: cm From Medial Instep 47.5 cm cm Ankle Left: Right: Point of Measurement: cm From Medial Instep 34 cm cm Vascular Assessment Pulses: Posterior Tibial Palpable: [Left:Yes] Dorsalis Pedis Palpable: [Left:Yes] Extremity colors, hair growth, and conditions: Extremity Color: [Left:Hyperpigmented] Hair Growth on Extremity: [Left:Yes] Temperature of Extremity: [Left:Warm] Capillary Refill: [Left:< 3 seconds] Toe Nail Assessment Left: Right: Thick: Yes Discolored:  Yes Deformed: Yes Improper Length and Hygiene: Yes Electronic Signature(s) Signed: 07/15/2015 6:00:56 PM By: Michaelene Song, [name] ([id number]) Entered By: [name] on 07/15/2015 15:01:36 Veldman, Donold [name] ([id number]) -------------------------------------------------------------------------------- Multi Wound Chart Details Patient Name: [name], [name] 07/15/2015 2:45 Date of Service: PM Medical Record [id number] Number: Patient Account Number: [id number] Date of Birth/Sex: 12-05-1940 (74 y.o. Male) Treating RN: [name], [name] Primary Care [facility], Other Clinician: Physician: [name] Treating [name], Physician/Extender: Referring Physician: Armida [name] in Treatment: 3 Vital Signs Height(in): 70 Pulse(bpm): 79 Weight(lbs): 296 Blood Pressure 162/89 (mmHg): Body  Mass Index(BMI): 42 Temperature(F): 97.6 Respiratory Rate 18 (breaths/min): Photos: [1:No Photos] [N/A:N/A] Wound Location: [1:Left Lower Leg - Lateral] [N/A:N/A] Wounding Event: [1:Trauma] [N/A:N/A] Primary Etiology: [1:Lymphedema] [N/A:N/A] Secondary Etiology: [1:Trauma, Other] [N/A:N/A] Comorbid History: [1:Lymphedema, Chronic Obstructive Pulmonary Disease (COPD), Sleep Apnea, Congestive Heart Failure, Deep Vein Thrombosis, Hypertension, Seizure Disorder] [N/A:N/A] Date Acquired: [1:05/09/2015] [N/A:N/A] Weeks of Treatment: [1:3] [N/A:N/A] Wound Status: [1:Open] [N/A:N/A] Measurements L x W x D 2.7x6x0.4 [N/A:N/A] (cm) Area (cm) : [1:12.723] [N/A:N/A] Volume (cm) : [1:5.089] [N/A:N/A] % Reduction in Area: [1:47.40%] [N/A:N/A] % Reduction in Volume: 86.00% [N/A:N/A] Classification: [1:Full Thickness Without Exposed Support Structures] [N/A:N/A] Exudate Amount: [1:Large] [N/A:N/A] Exudate Type: [1:Serosanguineous] [N/A:N/A] Exudate Color: red, brown N/A N/A Wound Margin: Flat and Intact N/A N/A Granulation Amount: Small (1-33%) N/A N/A Granulation  Quality: Red, Hyper-granulation N/A N/A Necrotic Amount: Small (1-33%) N/A N/A Exposed Structures: Fascia: No N/A N/A Fat: No Tendon: No Muscle: No Joint: No Bone: No Limited to Skin Breakdown Epithelialization: None N/A N/A Periwound Skin Texture: Edema: Yes N/A N/A Excoriation: No Induration: No Callus: No Crepitus: No Fluctuance: No Friable: No Rash: No Scarring: No Periwound Skin Maceration: Yes N/A N/A Moisture: Moist: Yes Dry/Scaly: No Periwound Skin Color: Erythema: Yes N/A N/A Hemosiderin Staining: Yes Atrophie Blanche: No Cyanosis: No Ecchymosis: No Mottled: No Pallor: No Rubor: No Erythema Location: Circumferential N/A N/A Temperature: No Abnormality N/A N/A Tenderness on Yes N/A N/A Palpation: Wound Preparation: Ulcer Cleansing: N/A N/A Rinsed/Irrigated with Saline Topical Anesthetic Applied: Other: lidocaine 4% Treatment Notes Electronic Signature(s) [name], [name] ([id number]) Signed: 07/15/2015 6:00:56 PM By: [name] Entered By: [name] on 07/15/2015 15:12:28 [name], [name] ([id number]) -------------------------------------------------------------------------------- Multi-Disciplinary Care Plan Details Patient Name: [name], [name] 07/15/2015 2:45 Date of Service: PM Medical Record [id number] Number: Patient Account Number: [id number] Date of Birth/Sex: [birth year] (74 y.o. Male) Treating RN: [name], [name] Primary Care [facility], Other Clinician: Physician: [name] Treating [name], Physician/Extender: Referring Physician: Armida [name] in Treatment: 3 Active Inactive Abuse / Safety / Falls / Self Care Management Nursing Diagnoses: Impaired physical mobility Potential for falls Goals: Patient will remain injury free Date Initiated: 06/24/2015 Goal Status: Active Interventions: Assess fall risk on admission and as needed Notes: Necrotic Tissue Nursing Diagnoses: Impaired  tissue integrity related to necrotic/devitalized tissue Goals: Necrotic/devitalized tissue will be minimized in the wound bed Date Initiated: 06/24/2015 Goal Status: Active Interventions: Assess patient pain level pre-, during and post procedure and prior to discharge Treatment Activities: Apply topical anesthetic as ordered : 07/15/2015 Notes: Orientation to the Wound Care Program LISA, BLAKEMAN ([id number]) Nursing Diagnoses: Knowledge deficit related to the wound healing center program Goals: Patient/caregiver will verbalize understanding of the Wound Healing Center Program Date Initiated: 06/24/2015 Goal Status: Active Interventions: Provide education on orientation to the wound center Notes: Wound/Skin Impairment Nursing Diagnoses: Knowledge deficit related to ulceration/compromised skin integrity Goals: Ulcer/skin breakdown will heal within 14 weeks Date Initiated: 06/24/2015 Goal Status: Active Interventions: Provide education on ulcer and skin care Treatment Activities: Patient referred to home care : 07/15/2015 Skin care regimen initiated : 07/15/2015 Notes: Electronic Signature(s) Signed: 07/15/2015 6:00:56 PM By: [name] Entered By: [name] on 07/15/2015 15:03:58 Mcgillis, Dajour [name] ([id number]) -------------------------------------------------------------------------------- Pain Assessment Details Patient Name: [name], [name] 07/15/2015 2:45 Date of Service: PM Medical Record [id number] Number: Patient Account Number: [id number] Date of Birth/Sex: [birth year] (74 y.o. Male) Treating RN: Phillis Haggis Primary Care [facility], Other Clinician: Physician: [name] Treating [name], Physician/Extender: Referring Physician: Armida [name] in Treatment:  3 Active Problems Location of Pain Severity and Description of Pain Patient Has Paino Yes Site Locations With Dressing Change: Yes Rate the pain. Current Pain  Level: 5 Worst Pain Level: 8 Least Pain Level: 0 Character of Pain Describe the Pain: Throbbing Pain Management and Medication Current Pain Management: Electronic Signature(s) Signed: 07/15/2015 6:00:56 PM By: [name] MullingPinkerton, Debra Entered By: [name] MullingPinkerton, Debra on 07/15/2015 14:52:11 Hamman, [name] HutchingJERRY L. ([id number]) -------------------------------------------------------------------------------- Patient/Caregiver Education Details Patient Name: [name], [name] 07/15/2015 2:45 Date of Service: PM Medical Record [id number] Number: Patient Account Number: [id number] Date of Birth/Gender: 11/14/1940 100(74 y.o. Male) Treating RN: [name] CordiaPinkerton, [name] Other Clinician: Physician: [name] Treating Britto, [name] BlalockErrol VARADARAJAN, Physician/Extender: Referring Physician: Armida SansUPASHREE Weeks in Treatment: 3 Education Assessment Education Provided To: Patient and Caregiver Education Topics Provided Wound/Skin Impairment: Handouts: Other: do not get wrap wet Methods: Demonstration, Explain/Verbal Responses: State content correctly Electronic Signature(s) Signed: 07/15/2015 6:00:56 PM By: [name] MullingPinkerton, Debra Entered By: [name] MullingPinkerton, Debra on 07/15/2015 15:31:07 Sinkfield, Jeshawn [name] EwingsL. ([id number]) -------------------------------------------------------------------------------- Wound Assessment Details Patient Name: [name], [name] 07/15/2015 2:45 Date of Service: PM Medical Record [id number] Number: Patient Account Number: [id number] Date of Birth/Sex: 11/14/1940 [age] y.o. Male) Treating RN: [name] CordiaPinkerton, [name] Primary Care [facility], Other Clinician: Physician: [name] Treating Britto, [name] BlalockErrol VARADARAJAN, Physician/Extender: Referring Physician: Armida SansUPASHREE Weeks in Treatment: 3 Wound Status Wound Number: 1 Primary Lymphedema Etiology: Wound Location: Left Lower Leg - Lateral Secondary Trauma, Other Wounding Event: Trauma Etiology: Date Acquired: 05/09/2015 Wound Open Weeks Of  Treatment: 3 Status: Clustered Wound: No Comorbid Lymphedema, Chronic Obstructive History: Pulmonary Disease (COPD), Sleep Apnea, Congestive Heart Failure, Deep Vein Thrombosis, Hypertension, Seizure Disorder Photos Photo Uploaded By: [name] MullingPinkerton, Debra on 07/15/2015 17:47:21 Wound Measurements Length: (cm) 2.7 Width: (cm) 6 Depth: (cm) 0.4 Area: (cm) 12.723 Volume: (cm) 5.089 % Reduction in Area: 47.4% % Reduction in Volume: 86% Epithelialization: None Tunneling: No Undermining: No Wound Description Full Thickness Without Exposed Classification: Support Structures Wound Margin: Flat and Intact Large Brewton, Samaj L. ([id number]) Foul Odor After Cleansing: No Exudate Amount: Exudate Type: Serosanguineous Exudate Color: red, brown Wound Bed Granulation Amount: Small (1-33%) Exposed Structure Granulation Quality: Red, Hyper-granulation Fascia Exposed: No Necrotic Amount: Small (1-33%) Fat Layer Exposed: No Necrotic Quality: Adherent Slough Tendon Exposed: No Muscle Exposed: No Joint Exposed: No Bone Exposed: No Limited to Skin Breakdown Periwound Skin Texture Texture Color No Abnormalities Noted: No No Abnormalities Noted: No Callus: No Atrophie Blanche: No Crepitus: No Cyanosis: No Excoriation: No Ecchymosis: No Fluctuance: No Erythema: Yes Friable: No Erythema Location: Circumferential Induration: No Hemosiderin Staining: Yes Localized Edema: Yes Mottled: No Rash: No Pallor: No Scarring: No Rubor: No Moisture Temperature / Pain No Abnormalities Noted: No Temperature: No Abnormality Dry / Scaly: No Tenderness on Palpation: Yes Maceration: Yes Moist: Yes Wound Preparation Ulcer Cleansing: Rinsed/Irrigated with Saline Topical Anesthetic Applied: Other: lidocaine 4%, Treatment Notes Wound #1 (Left, Lateral Lower Leg) 1. Cleansed with: Clean wound with Normal Saline 2. Anesthetic Topical Lidocaine 4% cream to wound bed prior to  debridement 4. Dressing Applied: Other dressing (specify in notes) 5. Secondary Dressing Applied Din, Vladislav L. ([id number]) ABD Pad 7. Secured with Henriette CombsUnna Boot to Left Lower Extremity Notes sorbact Electronic Signature(s) Signed: 07/15/2015 6:00:56 PM By: [name] MullingPinkerton, Debra Entered By: [name] MullingPinkerton, Debra on 07/15/2015 15:03:50 Neis, [name] HutchingJERRY L. ([id number]) -------------------------------------------------------------------------------- Vitals Details Patient Name: [name], Kameran L. 07/15/2015 2:45 Date of Service: PM Medical Record [id number] Number: Patient Account Number: [id number] Date of Birth/Sex: 11/14/1940 [age] y.o. Male) Treating RN: Phillis HaggisPinkerton, [name] Primary Care [facility],  Other Clinician: Physician: [name] Treating [name], Physician/Extender: Referring Physician: Armida [name] in Treatment: 3 Vital Signs Time Taken: 14:52 Temperature (F): 97.6 Height (in): 70 Pulse (bpm): 79 Weight (lbs): 296 Respiratory Rate (breaths/min): 18 Body Mass Index (BMI): 42.5 Blood Pressure (mmHg): 162/89 Reference Range: 80 - 120 mg / dl Electronic Signature(s) Signed: 07/15/2015 6:00:56 PM By: [name] Entered By: [name] on 07/15/2015 14:54:05

## 2015-07-21 ENCOUNTER — Encounter: Payer: Medicare Other | Admitting: Surgery

## 2015-07-21 DIAGNOSIS — L97222 Non-pressure chronic ulcer of left calf with fat layer exposed: Secondary | ICD-10-CM | POA: Diagnosis not present

## 2015-07-22 ENCOUNTER — Ambulatory Visit: Payer: Medicare Other | Admitting: Surgery

## 2015-07-22 NOTE — Progress Notes (Signed)
ZACHARIAH, PAVEK ([id number]) Visit Report for 07/21/2015 Chief Complaint Document Details Patient Name: [name], [name] 07/21/2015 3:30 Date of Service: PM Medical Record [id number] Number: Patient Account Number: [id number] Date of Birth/Sex: 1941/02/19 (74 y.o. Male) Treating RN: Curtis Sites Primary Care [facility], Other Clinician: Physician: RUPASHREE Treating Avalie Oconnor, Verl Blalock, Physician/Extender: Referring Physician: Armida Sans in Treatment: 3 Information Obtained from: Patient Chief Complaint Patient presents to the wound care center for a consult due non healing wound. nonhealing wound of the left lower extremity which has been present for about a month and a half. Electronic Signature(s) Signed: 07/21/2015 4:14:30 PM By: Evlyn Kanner MD, FACS Entered By: Evlyn Kanner on 07/21/2015 16:14:30 Strohm, [name] ([id number]) -------------------------------------------------------------------------------- HPI Details Patient Name: [name] 07/21/2015 3:30 Date of Service: PM Medical Record [id number] Number: Patient Account Number: [id number] Date of Birth/Sex: 30-Jan-1941 (74 y.o. Male) Treating RN: Curtis Sites Primary Care [facility], Other Clinician: Physician: RUPASHREE Treating Shelsie Tijerino, Verl Blalock, Physician/Extender: Referring Physician: Armida Sans in Treatment: 3 History of Present Illness Location: left lower extremity wound Quality: Patient reports experiencing a dull pain to affected area(s). Severity: Patient states wound are getting worse. Duration: Patient has had the wound for < 6 weeks prior to presenting for treatment Timing: Pain in wound is Intermittent (comes and goes Context: The wound occurred when the patient had a fall and cut his leg which required several sutures. He is also been on several antibiotics including ciprofloxacin and clindamycin. Modifying Factors: Consults to this date include:he  has had bilateral lower limb lymphedema and has been using lymphedema pumps. Associated Signs and Symptoms: Patient reports having difficulty standing for long periods. HPI Description: 74 year old gentleman who has a history of seizures and schizophrenia with bipolar disorder enhances in a assisted living facility. He also has CHF, COPD, GERD and remote history of DVT. He has been treated by the vascular group at [facility] for bilateral lymphedema and has been using lymphedema pumps. This problem started on September 30 when he had a lacerated wound after a fall. The staples were left in for about 10 days and when the staples were removed the wound opened out. Since then he's had several issues with wound infection and the most recent culture showed a heavy growth of Pseudomonas aeruginosa and Staphylococcus aureus and he has received clindamycin and the most recent past and from today he is on levofloxacin. Addendum:Notes were reviewed from a previous vascular consult at [facility] by Dr. Gilda Crease who saw him on 05/27/2014. He had recommended graduated compression stockings class I and also added lymph pumps to improve the control of the patient's lymphedema. 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 Diffuse soft-tissue swelling correlate with bone scan recommended. 07/15/2015 -- he is still awaiting insurance clearance for his MRI and other than that he has no other changes. 07/21/2015 -- he now has the insurance clearance and his MRI has been scheduled for December 28th. Electronic Signature(s) Signed: 07/21/2015 4:14:56 PM By: Evlyn Kanner MD, FACS Entered By: Evlyn Kanner on 07/21/2015 16:14:56 Markin, [name] ([id number]) -------------------------------------------------------------------------------- Physical Exam Details Patient Name: [name]  07/21/2015 3:30 Date of Service: PM Medical Record [id number] Number: Patient Account Number: [id number] Date of Birth/Sex: Dec 22, 1940 (74 y.o. Male) Treating RN: Curtis Sites Primary Care [facility], Other Clinician: Physician: RUPASHREE Treating Taegan Standage, Verl Blalock, Physician/Extender: Referring Physician: Armida Sans in Treatment: 3 Constitutional . Pulse regular. Respirations normal  and unlabored. Afebrile. . Eyes Nonicteric. Reactive to light. Ears, Nose, Mouth, and Throat Lips, teeth, and gums WNL.Marland Kitchen Moist mucosa without lesions . Neck supple and nontender. No palpable supraclavicular or cervical adenopathy. Normal sized without goiter. Respiratory WNL. No retractions.. Cardiovascular Pedal Pulses WNL. No clubbing, cyanosis or edema. Lymphatic No adneopathy. No adenopathy. No adenopathy. Musculoskeletal Adexa without tenderness or enlargement.. Digits and nails w/o clubbing, cyanosis, infection, petechiae, ischemia, or inflammatory conditions.. Integumentary (Hair, Skin) No suspicious lesions. No crepitus or fluctuance. No peri-wound warmth or erythema. No masses.Marland Kitchen Psychiatric Judgement and insight Intact.. No evidence of depression, anxiety, or agitation.. Notes the wound is looking very good and has filled in nicely and at this stage we will change over to some collagen dressing locally. Electronic Signature(s) Signed: 07/21/2015 4:15:37 PM By: Evlyn Kanner MD, FACS Entered By: Evlyn Kanner on 07/21/2015 16:15:37 Decamp, [name] ([id number]) -------------------------------------------------------------------------------- Physician Orders Details Patient Name: [name] 07/21/2015 3:30 Date of Service: PM Medical Record [id number] Number: Patient Account Number: [id number] Date of Birth/Sex: 03-Jun-1941 (74 y.o. Male) Treating RN: Curtis Sites Primary Care [facility], Other Clinician: Physician: RUPASHREE Treating Imanii Gosdin,  Verl Blalock, Physician/Extender: Referring Physician: Armida Sans in Treatment: 3 Verbal / Phone Orders: Yes Clinician: Curtis Sites Read Back and Verified: Yes Diagnosis Coding Wound Cleansing Wound #1 Left,Lateral Lower Leg o Clean wound with Normal Saline. Anesthetic Wound #1 Left,Lateral Lower Leg o Topical Lidocaine 4% cream applied to wound bed prior to debridement Primary Wound Dressing Wound #1 Left,Lateral Lower Leg o Prisma Ag - or collagen with silver equivalent Secondary Dressing Wound #1 Left,Lateral Lower Leg o ABD pad Dressing Change Frequency Wound #1 Left,Lateral Lower Leg o Change dressing every week o Other: - Home health to change wrap if it slides down more than 1 inch; caregiver with call HHRN if necessary. Follow-up Appointments Wound #1 Left,Lateral Lower Leg o Return Appointment in 1 week. Edema Control Wound #1 Left,Lateral Lower Leg o Unna Boot to Left Lower Extremity - Pink paste o Elevate legs to the level of the heart and pump ankles as often as possible o Compression Pump: Use compression pump on left lower extremity for 30 minutes, twice daily. Bures, Hilton L. ([id number]) Additional Orders / Instructions Wound #1 Left,Lateral Lower Leg o Increase protein intake. o Activity as tolerated Home Health Wound #1 Left,Lateral Lower Leg o Continue Home Health Visits - HHRN to be notified to change wrap if it slides more than one inch. o Home Health Nurse may visit PRN to address patientos wound care needs. o FACE TO FACE ENCOUNTER: MEDICARE and MEDICAID PATIENTS: I certify that this patient is under my care and that I had a face-to-face encounter that meets the physician face-to-face encounter requirements with this patient on this date. The encounter with the patient was in whole or in part for the following MEDICAL CONDITION: (primary reason for Home Healthcare) MEDICAL NECESSITY: I certify, that  based on my findings, NURSING services are a medically necessary home health service. HOME BOUND STATUS: I certify that my clinical findings support that this patient is homebound (i.e., Due to illness or injury, pt requires aid of supportive devices such as crutches, cane, wheelchairs, walkers, the use of special transportation or the assistance of another person to leave their place of residence. There is a normal inability to leave the home and doing so requires considerable and taxing effort. Other absences are for medical reasons / religious services and are infrequent or of short duration when for other  reasons). o If current dressing causes regression in wound condition, may D/C ordered dressing product/s and apply Normal Saline Moist Dressing daily until next Wound Healing Center / Other MD appointment. Notify Wound Healing Center of regression in wound condition at [phone]. o Please direct any NON-WOUND related issues/requests for orders to patient's Primary Care Physician Electronic Signature(s) Signed: 07/21/2015 4:36:00 PM By: Evlyn Kanner MD, FACS Signed: 07/21/2015 5:57:37 PM By: Curtis Sites Entered By: Curtis Sites on 07/21/2015 16:13:56 Genrich, [name] ([id number]) -------------------------------------------------------------------------------- Problem List Details Patient Name: BANNER, HUCKABA 07/21/2015 3:30 Date of Service: PM Medical Record [id number] Number: Patient Account Number: [id number] Date of Birth/Sex: 1940/08/11 (74 y.o. 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/24/2015  Yes exposed S81.811A Laceration without foreign body, right lower leg, initial 06/24/2015 Yes encounter E66.01 Morbid (severe) obesity due to excess calories 06/24/2015 Yes I50.9 Heart failure, unspecified 06/24/2015 Yes Inactive Problems Resolved Problems Electronic Signature(s) Signed: 07/21/2015 4:14:24 PM By: Evlyn Kanner MD, FACS Entered By: Evlyn Kanner on 07/21/2015 16:14:24 Salido, Teofilo Elbert Ewings ([id number]) -------------------------------------------------------------------------------- Progress Note Details Patient Name: ATWOOD, ADCOCK 07/21/2015 3:30 Date of Service: PM Medical Record [id number] Number: Patient Account Number: [id number] Date of Birth/Sex: August 20, 1940 (74 y.o. Male) Treating RN: Curtis Sites Primary Care [facility], Other Clinician: Physician: RUPASHREE Treating Ariez Neilan, Verl Blalock, Physician/Extender: Referring Physician: Armida Sans in Treatment: 3 Subjective Chief Complaint Information obtained from Patient Patient presents to the wound care center for a consult due non healing wound. nonhealing wound of the left lower extremity which has been present for about a month and a half. History of Present Illness (HPI) The following HPI elements were documented for the patient's wound: Location: left lower extremity wound Quality: Patient reports experiencing a dull pain to affected area(s). Severity: Patient states wound are getting worse. Duration: Patient has had the wound for < 6 weeks prior to presenting for treatment Timing: Pain in wound is Intermittent (comes and goes Context: The wound occurred when the patient had a fall and cut his leg which required several sutures. He is also been on several antibiotics including ciprofloxacin and clindamycin. Modifying Factors: Consults to this date include:he has had bilateral lower limb lymphedema and has been using lymphedema pumps. Associated Signs and Symptoms: Patient reports having  difficulty standing for long periods. 74 year old gentleman who has a history of seizures and schizophrenia with bipolar disorder enhances in a assisted living facility. He also has CHF, COPD, GERD and remote history of DVT. He has been treated by the vascular group at [facility] for bilateral lymphedema and has been using lymphedema pumps. This problem started on September 30 when he had a lacerated wound after a fall. The staples were left in for about 10 days and when the staples were removed the wound opened out. Since then he's had several issues with wound infection and the most recent culture showed a heavy growth of Pseudomonas aeruginosa and Staphylococcus aureus and he has received clindamycin and the most recent past and from today he is on levofloxacin. Addendum:Notes were reviewed from a previous vascular consult at [facility] by Dr. Gilda Crease who saw him on 05/27/2014. He had recommended graduated compression stockings class I and also added lymph pumps to improve the control of the patient's  lymphedema. 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 Diffuse soft-tissue swelling correlate with bone scan recommended. 07/15/2015 -- he is still awaiting insurance clearance for his MRI and other than that he has no other changes. Cephas DarbyCRAWFORD, Dequincy L. ([id number]) 07/21/2015 -- he now has the insurance clearance and his MRI has been scheduled for December 28th. Objective Constitutional Pulse regular. Respirations normal and unlabored. Afebrile. Vitals Time Taken: 3:48 PM, Height: 70 in, Weight: 296 lbs, BMI: 42.5, Temperature: 98.1 F, Pulse: 67 bpm, Respiratory Rate: 18 breaths/min, Blood Pressure: 164/86 mmHg. Eyes Nonicteric. Reactive to light. Ears, Nose, Mouth, and Throat Lips, teeth, and gums WNL.Marland Kitchen. Moist mucosa without lesions . Neck supple and nontender. No  palpable supraclavicular or cervical adenopathy. Normal sized without goiter. Respiratory WNL. No retractions.. Cardiovascular Pedal Pulses WNL. No clubbing, cyanosis or edema. Lymphatic No adneopathy. No adenopathy. No adenopathy. Musculoskeletal Adexa without tenderness or enlargement.. Digits and nails w/o clubbing, cyanosis, infection, petechiae, ischemia, or inflammatory conditions.Marland Kitchen. Psychiatric Judgement and insight Intact.. No evidence of depression, anxiety, or agitation.. General Notes: the wound is looking very good and has filled in nicely and at this stage we will change over to some collagen dressing locally. Integumentary (Hair, Skin) No suspicious lesions. No crepitus or fluctuance. No peri-wound warmth or erythema. No masses.. Wound #1 status is Open. Original cause of wound was Trauma. The wound is located on the Left,Lateral Lower Leg. The wound measures 2.7cm length x 5.1cm width x 0.3cm depth; 10.815cm^2 area and Kemmerer, Hezakiah L. ([id number]) 3.244cm^3 volume. The wound is limited to skin breakdown. There is no tunneling or undermining noted. There is a large amount of serosanguineous drainage noted. The wound margin is flat and intact. There is large (67-100%) red granulation within the wound bed. There is a small (1-33%) amount of necrotic tissue within the wound bed including Adherent Slough. The periwound skin appearance exhibited: Localized Edema, Maceration, Moist, Hemosiderin Staining, Erythema. The periwound skin appearance did not exhibit: Callus, Crepitus, Excoriation, Fluctuance, Friable, Induration, Rash, Scarring, Dry/Scaly, Atrophie Blanche, Cyanosis, Ecchymosis, Mottled, Pallor, Rubor. The surrounding wound skin color is noted with erythema which is circumferential. Periwound temperature was noted as No Abnormality. Assessment Active Problems ICD-10 I89.0 - Lymphedema, not elsewhere classified L97.222 - Non-pressure chronic ulcer of left calf with  fat layer exposed S81.811A - Laceration without foreign body, right lower leg, initial encounter E66.01 - Morbid (severe) obesity due to excess calories I50.9 - Heart failure, unspecified I have recommended to use Prisma AG and bolster this in place and use an Unna's boot. He is also using his lymphedema pumps twice a day and will come back and see as next week. Plan Wound Cleansing: Wound #1 Left,Lateral Lower Leg: Clean wound with Normal Saline. Anesthetic: Wound #1 Left,Lateral Lower Leg: Topical Lidocaine 4% cream applied to wound bed prior to debridement Primary Wound Dressing: Wound #1 Left,Lateral Lower Leg: Prisma Ag - or collagen with silver equivalent Secondary Dressing: Wound #1 Left,Lateral Lower Leg: ABD pad Dressing Change Frequency: Wound #1 Left,Lateral Lower Leg: Change dressing every week Squitieri, Jeral L. ([id number]) Other: - Home health to change wrap if it slides down more than 1 inch; caregiver with call HHRN if necessary. Follow-up Appointments: Wound #1 Left,Lateral Lower Leg: Return Appointment in 1 week. Edema Control: Wound #1 Left,Lateral Lower Leg: Unna Boot to Left Lower Extremity - Pink paste Elevate legs to the level of the heart and pump  ankles as often as possible Compression Pump: Use compression pump on left lower extremity for 30 minutes, twice daily. Additional Orders / Instructions: Wound #1 Left,Lateral Lower Leg: Increase protein intake. Activity as tolerated Home Health: Wound #1 Left,Lateral Lower Leg: Continue Home Health Visits - HHRN to be notified to change wrap if it slides more than one inch. Home Health Nurse may visit PRN to address patient s wound care needs. FACE TO FACE ENCOUNTER: MEDICARE and MEDICAID PATIENTS: I certify that this patient is under my care and that I had a face-to-face encounter that meets the physician face-to-face encounter requirements with this patient on this date. The encounter with the patient  was in whole or in part for the following MEDICAL CONDITION: (primary reason for Home Healthcare) MEDICAL NECESSITY: I certify, that based on my findings, NURSING services are a medically necessary home health service. HOME BOUND STATUS: I certify that my clinical findings support that this patient is homebound (i.e., Due to illness or injury, pt requires aid of supportive devices such as crutches, cane, wheelchairs, walkers, the use of special transportation or the assistance of another person to leave their place of residence. There is a normal inability to leave the home and doing so requires considerable and taxing effort. Other absences are for medical reasons / religious services and are infrequent or of short duration when for other reasons). If current dressing causes regression in wound condition, may D/C ordered dressing product/s and apply Normal Saline Moist Dressing daily until next Wound Healing Center / Other MD appointment. Notify Wound Healing Center of regression in wound condition at [phone]. Please direct any NON-WOUND related issues/requests for orders to patient's Primary Care Physician I have recommended to use Prisma AG and bolster this in place and use an Unna's boot. He is also using his lymphedema pumps twice a day and will come back and see as next week. Electronic Signature(s) Signed: 07/21/2015 4:17:06 PM By: Evlyn Kanner MD, FACS Entered By: Evlyn Kanner on 07/21/2015 16:17:06 Fehrenbach, [name] ([id number]) -------------------------------------------------------------------------------- SuperBill Details Patient Name: [name] Date of Service: 07/21/2015 Medical Record Patient Account Number: [id number] Number: Dorthy, Treating RN: Date of Birth/Sex: Dec 26, 1940 (74 y.o. Male) Baldo Daub, Other Clinician: Primary Care Physician: RUPASHREE Treating Tasnia Spegal, Verl Blalock, Physician/Extender: Referring  Physician: Armida Sans in Treatment: 3 Diagnosis Coding ICD-10 Codes Code Description I89.0 Lymphedema, not elsewhere classified L97.222 Non-pressure chronic ulcer of left calf with fat layer exposed S81.811A Laceration without foreign body, right lower leg, initial encounter E66.01 Morbid (severe) obesity due to excess calories I50.9 Heart failure, unspecified Facility Procedures CPT4: Description Modifier Quantity Code [id number] (Facility Use Only) [phone] - APPLY MULTLAY COMPRS LWR LT 1 LEG Physician Procedures CPT4 Code Description: [id number] - WC PHYS LEVEL 3 - EST PT ICD-10 Description Diagnosis I89.0 Lymphedema, not elsewhere classified L97.222 Non-pressure chronic ulcer of left calf with fat layer S81.811A Laceration without foreign body, right lower  leg, init E66.01 Morbid (severe) obesity due to excess calories Modifier: exposed ial encounter Quantity: 1 Electronic Signature(s) Signed: 07/21/2015 5:18:30 PM By: Curtis Sites Previous Signature: 07/21/2015 4:17:43 PM Version By: Evlyn Kanner MD, FACS Entered By: Curtis Sites on 07/21/2015 17:18:30

## 2015-07-22 NOTE — Progress Notes (Signed)
[name], [name] ([id number]) Visit Report for 07/21/2015 Arrival Information Details Patient Name: [name] [name] 07/21/2015 3:30 Date of Service: PM Medical Record [id number] Number: Patient Account Number: [id number] Date of Birth/Sex: 1940/12/28 (75 y.o. Male) Treating RN: [name] Primary Care [facility], Other Clinician: Physician: [name] Treating [name], Physician/Extender: Referring Physician: Armida [name] in Treatment: 3 Visit Information History Since Last Visit Added or deleted any medications: No Patient Arrived: Walker Any new allergies or adverse reactions: No Arrival Time: 15:48 Had a fall or experienced change in No Accompanied By: cg activities of daily living that may affect Transfer Assistance: None risk of falls: Patient Identification Verified: Yes Signs or symptoms of abuse/neglect since last No Secondary Verification Process Yes visito Completed: Hospitalized since last visit: No Patient Requires Transmission- Yes Pain Present Now: No Based Precautions: Transmission-Based Contact MRSA Precautions: Patient Has Alerts: Yes Patient Alerts: NOT Diabetic [facility] schizophrenia and bipolar Electronic Signature(s) Signed: 07/21/2015 5:57:37 PM By: [name] Entered By: [name] on 07/21/2015 15:48:34 [name] ([id number]) -------------------------------------------------------------------------------- Encounter Discharge Information Details Patient Name: [name] [name] 07/21/2015 3:30 Date of Service: PM Medical Record [id number] Number: Patient Account Number: [id number] Date of Birth/Sex: 06/28/1941 (74 y.o. Male) Treating RN: [name] Primary Care [name], Other Clinician: Physician: [name] Treating [name], Physician/Extender: Referring Physician: Armida [name] in Treatment: 3 Encounter Discharge Information Items Discharge  Pain Level: 0 Discharge Condition: Stable Ambulatory Status: Walker Discharge Destination: Home Transportation: Private Auto Accompanied By: [name] Schedule Follow-up Appointment: Yes Medication Reconciliation completed and provided to Patient/Care No [name]: Provided on Clinical Summary of Care: 07/21/2015 Form Type Recipient Paper Patient JC Electronic Signature(s) Signed: 07/21/2015 5:57:37 PM By: [name] Previous Signature: 07/21/2015 4:29:07 PM Version By: [name] Entered By: [name] on 07/21/2015 16:54:57 [name] ([id number]) -------------------------------------------------------------------------------- Lower Extremity Assessment Details Patient Name: [name] 07/21/2015 3:30 Date of Service: PM Medical Record [id number] Number: Patient Account Number: [id number] Date of Birth/Sex: 05/27/1941 (74 y.o. Male) Treating RN: [name] Primary Care [name], Other Clinician: Physician: [name] Treating [name], Physician/Extender: Referring Physician: Armida [name] in Treatment: 3 Edema Assessment Assessed: [Left: No] [Right: No] Edema: [Left: Ye] [Right: s] Calf Left: Right: Point of Measurement: 33 cm From Medial Instep 47 cm cm Ankle Left: Right: Point of Measurement: 11 cm From Medial Instep 35.3 cm cm Vascular Assessment Pulses: Posterior Tibial Dorsalis Pedis Palpable: [Left:Yes] Extremity colors, hair growth, and conditions: Extremity Color: [Left:Hyperpigmented] Hair Growth on Extremity: [Left:No] Temperature of Extremity: [Left:Cool] Capillary Refill: [Left:< 3 seconds] Toe Nail Assessment Left: Right: Thick: Yes Discolored: Yes Deformed: Yes Improper Length and Hygiene: No Electronic Signature(s) Signed: 07/21/2015 5:57:37 PM By: [name], [name] ([id number]) Entered By: [name] on 07/21/2015 15:57:38 [name]  ([id number]) -------------------------------------------------------------------------------- Multi Wound Chart Details Patient Name: [name] [name] 07/21/2015 3:30 Date of Service: PM Medical Record [id number] Number: Patient Account Number: [id number] Date of Birth/Sex: [birth year] (74 y.o. Male) Treating RN: [name] Primary Care [name], Other Clinician: Physician: [name] Treating [name], Physician/Extender: Referring Physician: Armida [name] in Treatment: 3 Vital Signs Height(in): 70 Pulse(bpm): 67 Weight(lbs): 296 Blood Pressure 164/86 (mmHg): Body Mass Index(BMI): 42 Temperature(F): 98.1 Respiratory Rate 18 (breaths/min): Photos: [1:No Photos] [N/A:N/A] Wound Location: [1:Left Lower Leg - Lateral] [N/A:N/A] Wounding Event: [1:Trauma] [N/A:N/A] Primary Etiology: [1:Lymphedema] [N/A:N/A] Secondary Etiology: [1:Trauma, Other] [N/A:N/A] Comorbid History: [1:Lymphedema, Chronic Obstructive Pulmonary Disease (COPD), Sleep Apnea, Congestive Heart Failure, Deep Vein Thrombosis, Hypertension, Seizure  Disorder] [N/A:N/A] Date Acquired: [1:05/09/2015] [N/A:N/A] Weeks of Treatment: [1:3] [N/A:N/A] Wound Status: [1:Open] [N/A:N/A] Measurements L x W x D 2.7x5.1x0.3 [N/A:N/A] (cm) Area (cm) : [1:10.815] [N/A:N/A] Volume (cm) : [1:3.244] [N/A:N/A] % Reduction in Area: [1:55.30%] [N/A:N/A] % Reduction in Volume: 91.10% [N/A:N/A] Classification: [1:Full Thickness Without Exposed Support Structures] [N/A:N/A] Exudate Amount: [1:Large] [N/A:N/A] Exudate Type: [1:Serosanguineous] [N/A:N/A] Exudate Color: red, brown N/A N/A Wound Margin: Flat and Intact N/A N/A Granulation Amount: Large (67-100%) N/A N/A Granulation Quality: Red, Hyper-granulation N/A N/A Necrotic Amount: Small (1-33%) N/A N/A Exposed Structures: Fascia: No N/A N/A Fat: No Tendon: No Muscle: No Joint: No Bone: No Limited to Skin Breakdown Epithelialization: None  N/A N/A Periwound Skin Texture: Edema: Yes N/A N/A Excoriation: No Induration: No Callus: No Crepitus: No Fluctuance: No Friable: No Rash: No Scarring: No Periwound Skin Maceration: Yes N/A N/A Moisture: Moist: Yes Dry/Scaly: No Periwound Skin Color: Erythema: Yes N/A N/A Hemosiderin Staining: Yes Atrophie Blanche: No Cyanosis: No Ecchymosis: No Mottled: No Pallor: No Rubor: No Erythema Location: Circumferential N/A N/A Temperature: No Abnormality N/A N/A Tenderness on No N/A N/A Palpation: Wound Preparation: Ulcer Cleansing: Other: N/A N/A soap and water Topical Anesthetic Applied: Other: lidocaine 4% Treatment Notes Electronic Signature(s) Signed: 07/21/2015 5:57:37 PM By: [name], [name] ([id number]) Entered By: [name] on 07/21/2015 16:05:01 [name] ([id number]) -------------------------------------------------------------------------------- Multi-Disciplinary Care Plan Details Patient Name: [name] [name] 07/21/2015 3:30 Date of Service: PM Medical Record [id number] Number: Patient Account Number: [id number] Date of Birth/Sex: 03/28/1941 (74 y.o. Male) Treating RN: [name] Primary Care [facility], Other Clinician: Physician: [name] Treating [name], Physician/Extender: Referring Physician: Armida [name] in Treatment: 3 Active Inactive Abuse / Safety / Falls / Self Care Management Nursing Diagnoses: Impaired physical mobility Potential for falls Goals: Patient will remain injury free Date Initiated: 06/24/2015 Goal Status: Active Interventions: Assess fall risk on admission and as needed Notes: Necrotic Tissue Nursing Diagnoses: Impaired tissue integrity related to necrotic/devitalized tissue Goals: Necrotic/devitalized tissue will be minimized in the wound bed Date Initiated: 06/24/2015 Goal Status: Active Interventions: Assess patient pain level pre-, during and post  procedure and prior to discharge Treatment Activities: Apply topical anesthetic as ordered : 07/21/2015 Notes: Orientation to the Wound Care Program NAZIRE, FRUTH ([id number]) Nursing Diagnoses: Knowledge deficit related to the wound healing center program Goals: Patient/caregiver will verbalize understanding of the Wound Healing Center Program Date Initiated: 06/24/2015 Goal Status: Active Interventions: Provide education on orientation to the wound center Notes: Wound/Skin Impairment Nursing Diagnoses: Knowledge deficit related to ulceration/compromised skin integrity Goals: Ulcer/skin breakdown will heal within 14 weeks Date Initiated: 06/24/2015 Goal Status: Active Interventions: Provide education on ulcer and skin care Treatment Activities: Patient referred to home care : 07/21/2015 Skin care regimen initiated : 07/21/2015 Notes: Electronic Signature(s) Signed: 07/21/2015 5:57:37 PM By: [name] Entered By: [name] on 07/21/2015 16:04:55 [name] ([id number]) -------------------------------------------------------------------------------- Patient/Caregiver Education Details Patient Name: [name] [name] 07/21/2015 3:30 Date of Service: PM Medical Record [id number] Number: Patient Account Number: [id number] Date of Birth/Gender: 10/24/1940 (74 y.o. Male) Treating RN: [name] Primary Care [facility], Other Clinician: Physician: [name] Treating [name], Physician/Extender: Referring Physician: Armida [name] in Treatment: 3 Education Assessment Education Provided To: Patient and Caregiver Education Topics Provided Venous: Handouts: Other: continue using lymph pumps Methods: Explain/Verbal Responses: State content correctly Electronic Signature(s) Signed: 07/21/2015 5:57:37 PM By: [name] Entered By: [name] on 07/21/2015 16:55:15 Lococo, Mckinnon L.  ([id number]) -------------------------------------------------------------------------------- Wound Assessment Details Patient  Name: [name], [name] 07/21/2015 3:30 Date of Service: PM Medical Record [id number] Number: Patient Account Number: [id number] Date of Birth/Sex: 02/27/1941 19(74 y.o. Male) Treating RN: [name] Sitesorthy, Joanna [facility], Other Clinician: Physician: [name] Treating Britto, Verl BlalockErrol [name], Physician/Extender: Referring Physician: Armida SansUPASHREE Weeks in Treatment: 3 Wound Status Wound Number: 1 Primary Lymphedema Etiology: Wound Location: Left Lower Leg - Lateral Secondary Trauma, Other Wounding Event: Trauma Etiology: Date Acquired: 05/09/2015 Wound Open Weeks Of Treatment: 3 Status: Clustered Wound: No Comorbid Lymphedema, Chronic Obstructive History: Pulmonary Disease (COPD), Sleep Apnea, Congestive Heart Failure, Deep Vein Thrombosis, Hypertension, Seizure Disorder Photos Photo Uploaded By: [name] Sitesorthy, Joanna on 07/21/2015 17:49:08 Wound Measurements Length: (cm) 2.7 Width: (cm) 5.1 Depth: (cm) 0.3 Area: (cm) 10.815 Volume: (cm) 3.244 % Reduction in Area: 55.3% % Reduction in Volume: 91.1% Epithelialization: None Tunneling: No Undermining: No Wound Description Full Thickness Without Exposed Classification: Support Structures Wound Margin: Flat and Intact Large Conlee, Joshuajames L. ([id number]) Foul Odor After Cleansing: No Exudate Amount: Exudate Type: Serosanguineous Exudate Color: red, brown Wound Bed Granulation Amount: Large (67-100%) Exposed Structure Granulation Quality: Red, Hyper-granulation Fascia Exposed: No Necrotic Amount: Small (1-33%) Fat Layer Exposed: No Necrotic Quality: Adherent Slough Tendon Exposed: No Muscle Exposed: No Joint Exposed: No Bone Exposed: No Limited to Skin Breakdown Periwound Skin Texture Texture Color No Abnormalities Noted: No No Abnormalities Noted: No Callus: No Atrophie  Blanche: No Crepitus: No Cyanosis: No Excoriation: No Ecchymosis: No Fluctuance: No Erythema: Yes Friable: No Erythema Location: Circumferential Induration: No Hemosiderin Staining: Yes Localized Edema: Yes Mottled: No Rash: No Pallor: No Scarring: No Rubor: No Moisture Temperature / Pain No Abnormalities Noted: No Temperature: No Abnormality Dry / Scaly: No Maceration: Yes Moist: Yes Wound Preparation Ulcer Cleansing: Other: soap and water, Topical Anesthetic Applied: Other: lidocaine 4%, Treatment Notes Wound #1 (Left, Lateral Lower Leg) 1. Cleansed with: Cleanse wound with antibacterial soap and water 2. Anesthetic Topical Lidocaine 4% cream to wound bed prior to debridement 4. Dressing Applied: Prisma Ag 5. Secondary Dressing Applied Phang, Jamontae L. ([id number]) Dry Gauze 7. Secured with Henriette CombsUnna Boot to Left Lower Extremity Electronic Signature(s) Signed: 07/21/2015 5:57:37 PM By: [name] Sitesorthy, Joanna Entered By: [name] Sitesorthy, Joanna on 07/21/2015 16:03:34 [name], [name] HutchingJERRY L. ([id number]) -------------------------------------------------------------------------------- Vitals Details Patient Name: [name], [name] 07/21/2015 3:30 Date of Service: PM Medical Record [id number] Number: Patient Account Number: [id number] Date of Birth/Sex: 02/27/1941 [age] y.o. Male) Treating RN: [name] Sitesorthy, Joanna Primary Care [name], Other Clinician: Physician: [name] Treating Britto, Verl BlalockErrol [name], Physician/Extender: Referring Physician: Armida SansUPASHREE Weeks in Treatment: 3 Vital Signs Time Taken: 15:48 Temperature (F): 98.1 Height (in): 70 Pulse (bpm): 67 Weight (lbs): 296 Respiratory Rate (breaths/min): 18 Body Mass Index (BMI): 42.5 Blood Pressure (mmHg): 164/86 Reference Range: 80 - 120 mg / dl Electronic Signature(s) Signed: 07/21/2015 5:57:37 PM By: [name] Sitesorthy, Joanna Entered By: [name] Sitesorthy, Joanna on 07/21/2015 15:50:44

## 2015-07-29 ENCOUNTER — Encounter: Payer: Medicare Other | Admitting: Surgery

## 2015-07-29 DIAGNOSIS — L97222 Non-pressure chronic ulcer of left calf with fat layer exposed: Secondary | ICD-10-CM | POA: Diagnosis not present

## 2015-07-30 NOTE — Progress Notes (Addendum)
[name], [name] ([id number]) Visit Report for 07/29/2015 Arrival Information Details Patient Name: [name], [name] 07/29/2015 12:45 Date of Service: PM Medical Record [id number] Number: Patient Account Number: [id number] Date of Birth/Sex: 1941-06-20 [age] y.o. Male) Treating RN: [name], [name], Other Clinician: Physician: [name] Treating [name] [name], Physician/Extender: Referring Physician: Armida [name] Weeks in Treatment: 5 Visit Information History Since Last Visit Added or deleted any medications: No Patient Arrived: Walker Any new allergies or adverse reactions: No Arrival Time: 12:58 Had a fall or experienced change in No Accompanied By: caregiver activities of daily living that may affect Transfer Assistance: None risk of falls: Patient Identification Verified: Yes Signs or symptoms of abuse/neglect since last No Secondary Verification Process Yes visito Completed: Has Dressing in Place as Prescribed: Yes Patient Requires Transmission- Yes Pain Present Now: No Based Precautions: Transmission-Based Contact MRSA Precautions: Patient Has Alerts: Yes Patient Alerts: NOT [facility]abetic [facility] schizophrenia and bipolar Electronic Signature(s) Signed: 07/29/2015 4:12:03 PM By: [name] EricAfful, [name] BSN, RN Entered By: [name] EricAfful, [name] on 07/29/2015 12:58:47 [name], [name] [name] ([id number]) -------------------------------------------------------------------------------- Clinic Level of Care Assessment Details Patient Name: [name], [name] 07/29/2015 12:45 Date of Service: PM Medical Record [id number] Number: Patient Account Number: [id number] Date of Birth/Sex: 1941-06-20 [age] y.o. Male) Treating RN: [name], [name], Other Clinician: Physician: [name] Treating [name] [name], Physician/Extender: Referring Physician: Armida [name] Weeks in Treatment: 5 Clinic Level of  Care Assessment Items TOOL 4 Quantity Score []  - Use when only an EandM is performed on FOLLOW-UP visit 0 ASSESSMENTS - Nursing Assessment / Reassessment X - Reassessment of Co-morbidities (includes updates in patient status) 1 10 X - Reassessment of Adherence to Treatment Plan 1 5 ASSESSMENTS - Wound and Skin Assessment / Reassessment X - Simple Wound Assessment / Reassessment - one wound 1 5 []  - Complex Wound Assessment / Reassessment - multiple wounds 0 []  - Dermatologic / Skin Assessment (not related to wound area) 0 ASSESSMENTS - Focused Assessment []  - Circumferential Edema Measurements - multi extremities 0 []  - Nutritional Assessment / Counseling / Intervention 0 []  - Lower Extremity Assessment (monofilament, tuning fork, pulses) 0 []  - Peripheral Arterial Disease Assessment (using hand held doppler) 0 ASSESSMENTS - Ostomy and/or Continence Assessment and Care []  - Incontinence Assessment and Management 0 []  - Ostomy Care Assessment and Management (repouching, etc.) 0 PROCESS - Coordination of Care X - Simple Patient / Family Education for ongoing care 1 15 []  - Complex (extensive) Patient / Family Education for ongoing care 0 []  - Staff obtains Consents, Records, Test Results / Process Orders 0 Hartwell, [name] ([id number]) []  - Staff telephones HHA, Nursing Homes / Clarify orders / etc 0 []  - Routine Transfer to another Facility (non-emergent condition) 0 []  - Routine Hospital Admission (non-emergent condition) 0 []  - New Admissions / [employer] Authorizations / Ordering NPWT, Apligraf, etc. 0 []  - Emergency Hospital Admission (emergent condition) 0 []  - Simple Discharge Coordination 0 []  - Complex (extensive) Discharge Coordination 0 PROCESS - Special Needs []  - Pediatric / Minor Patient Management 0 []  - Isolation Patient Management 0 []  - Hearing / Language / Visual special needs 0 []  - Assessment of Community assistance (transportation, D/C planning, etc.) 0 []  -  Additional assistance / Altered mentation 0 []  - Support Surface(s) Assessment (bed, cushion, seat, etc.) 0 INTERVENTIONS - Wound Cleansing / Measurement X - Simple Wound Cleansing - one wound 1 5 []  - Complex Wound Cleansing - multiple wounds  0 X - Wound Imaging (photographs - any number of wounds) 1 5  - Wound Tracing (instead of photographs) 0 X - Simple Wound Measurement - one wound 1 5  - Complex Wound Measurement - multiple wounds 0 INTERVENTIONS - Wound Dressings X - Small Wound Dressing one or multiple wounds 1 10  - Medium Wound Dressing one or multiple wounds 0  - Large Wound Dressing one or multiple wounds 0  - Application of Medications - topical 0  - Application of Medications - injection 0 [name], [name] ([id number]) INTERVENTIONS - Miscellaneous  - External ear exam 0  - Specimen Collection (cultures, biopsies, blood, body fluids, etc.) 0  - Specimen(s) / Culture(s) sent or taken to Lab for analysis 0  - Patient Transfer (multiple staff / Michiel Sites Lift / Similar devices) 0  - Simple Staple / Suture removal (25 or less) 0  - Complex Staple / Suture removal (26 or more) 0  - Hypo / Hyperglycemic Management (close monitor of Blood Glucose) 0  - Ankle / Brachial Index (ABI) - do not check if billed separately 0 X - Vital Signs 1 5 Has the patient been seen at the hospital within the last three years: Yes Total Score: 65 Level Of Care: New/Established - Level 2 Electronic Signature(s) Signed: 07/29/2015 4:10:50 PM By: [name] BSN, RN Entered By: [name] on 07/29/2015 16:10:50 [name] ([id number]) -------------------------------------------------------------------------------- Encounter Discharge Information Details Patient Name: DARIUSH, MCNELLIS 07/29/2015 12:45 Date of Service: PM Medical Record [id number] Number: Patient Account Number: [id number] Date of Birth/Sex: 11-10-1940 (74 y.o. Male) Treating RN: [name] Mealy, RN,  [name], [facility], Other Clinician: Physician: [name] Treating Britto, Verl Blalock, Physician/Extender: Referring Physician: Armida Sans in Treatment: 5 Encounter Discharge Information Items Discharge Pain Level: 0 Discharge Condition: Stable Ambulatory Status: Walker Discharge Destination: Home Transportation: Other Accompanied By: caregiver Schedule Follow-up Appointment: No Medication Reconciliation completed and provided to Patient/Care No Ceonna Frazzini: Provided on Clinical Summary of Care: 07/29/2015 Form Type Recipient Paper Patient JC Electronic Signature(s) Signed: 07/29/2015 1:20:35 PM By: Gwenlyn Perking Entered By: Gwenlyn Perking on 07/29/2015 13:20:35 Vandermeer, [name] ([id number]) -------------------------------------------------------------------------------- Lower Extremity Assessment Details Patient Name: [name], [name] 07/29/2015 12:45 Date of Service: PM Medical Record [id number] Number: Patient Account Number: [id number] Date of Birth/Sex: 04-11-1941 (74 y.o. Male) Treating RN: [name] Mealy, RN, [name], [facility], Other Clinician: Physician: [name] Treating Britto, Verl Blalock, Physician/Extender: Referring Physician: Armida Sans in Treatment: 5 Edema Assessment Assessed: [Left: No] [Right: No] E[Left: dema] [Right: :] Calf Left: Right: Point of Measurement: 33 cm From Medial Instep 47.3 cm cm Ankle Left: Right: Point of Measurement: 11 cm From Medial Instep 35.2 cm cm Vascular Assessment Claudication: Claudication Assessment [Left:None] Pulses: Posterior Tibial Dorsalis Pedis Palpable: [Left:Yes] Extremity colors, hair growth, and conditions: Extremity Color: [Left:Hyperpigmented] Hair Growth on Extremity: [Left:No] Temperature of Extremity: [Left:Warm] Capillary Refill: [Left:< 3 seconds] Toe Nail Assessment Left: Right: Thick: Yes Discolored: Yes Deformed: Yes Improper Length  and Hygiene: No Electronic Signature(s) ROCZEN, WAYMIRE ([id number]) Signed: 07/29/2015 12:53:10 PM By: [name] BSN, RN Entered By: [name] on 07/29/2015 12:53:09 Hopke, Javonta Elbert Ewings ([id number]) -------------------------------------------------------------------------------- Multi Wound Chart Details Patient Name: [name], [name] 07/29/2015 12:45 Date of Service: PM Medical Record [id number] Number: Patient Account Number: [id number] Date of Birth/Sex: 1940/08/10 (74 y.o. Male) Treating RN: [name] Mealy, RN, [name], [facility], Other Clinician: Physician: [name] Treating Britto, Verl Blalock, Physician/Extender: Referring Physician: Armida Sans in Treatment: 5 Vital Signs Height(in):  70 Pulse(bpm): 74 Weight(lbs): 296 Blood Pressure 148/71 (mmHg): Body Mass Index(BMI): 42 Temperature(F): 97.8 Respiratory Rate 18 (breaths/min): Photos: [1:No Photos] [N/A:N/A] Wound Location: [1:Left Lower Leg - Lateral] [N/A:N/A] Wounding Event: [1:Trauma] [N/A:N/A] Primary Etiology: [1:Lymphedema] [N/A:N/A] Secondary Etiology: [1:Trauma, Other] [N/A:N/A] Comorbid History: [1:Lymphedema, Chronic Obstructive Pulmonary Disease (COPD), Sleep Apnea, Congestive Heart Failure, Deep Vein Thrombosis, Hypertension, Seizure Disorder] [N/A:N/A] Date Acquired: [1:05/09/2015] [N/A:N/A] Weeks of Treatment: [1:5] [N/A:N/A] Wound Status: [1:Open] [N/A:N/A] Measurements L x W x D 1.9x3x0.3 [N/A:N/A] (cm) Area (cm) : [1:4.477] [N/A:N/A] Volume (cm) : [1:1.343] [N/A:N/A] % Reduction in Area: [1:81.50%] [N/A:N/A] % Reduction in Volume: 96.30% [N/A:N/A] Classification: [1:Full Thickness Without Exposed Support Structures] [N/A:N/A] Exudate Amount: [1:Large] [N/A:N/A] Exudate Type: [1:Serosanguineous] [N/A:N/A] Exudate Color: red, brown N/A N/A Wound Margin: Flat and Intact N/A N/A Granulation Amount: Large (67-100%) N/A N/A Granulation Quality: Red,  Hyper-granulation N/A N/A Necrotic Amount: None Present (0%) N/A N/A Exposed Structures: Fascia: No N/A N/A Fat: No Tendon: No Muscle: No Joint: No Bone: No Limited to Skin Breakdown Epithelialization: Medium (34-66%) N/A N/A Periwound Skin Texture: Edema: Yes N/A N/A Excoriation: No Induration: No Callus: No Crepitus: No Fluctuance: No Friable: No Rash: No Scarring: No Periwound Skin Moist: Yes N/A N/A Moisture: Maceration: No Dry/Scaly: No Periwound Skin Color: Hemosiderin Staining: Yes N/A N/A Atrophie Blanche: No Cyanosis: No Ecchymosis: No Erythema: No Mottled: No Pallor: No Rubor: No Temperature: No Abnormality N/A N/A Tenderness on No N/A N/A Palpation: Wound Preparation: Ulcer Cleansing: Other: N/A N/A soap and water Topical Anesthetic Applied: Other: lidocaine 4% Treatment Notes Electronic Signature(s) Signed: 07/29/2015 4:12:03 PM By: [name] BSN, RN Entered By: [name] on 07/29/2015 13:11:03 [name], [name] ([id number]) [name], [name] ([id number]) -------------------------------------------------------------------------------- Multi-Disciplinary Care Plan Details Patient Name: [name], [name] 07/29/2015 12:45 Date of Service: PM Medical Record [id number] Number: Patient Account Number: [id number] Date of Birth/Sex: 11/06/1940 (74 y.o. Male) Treating RN: [name] Mealy, RN, [name], [facility], Other Clinician: Physician: [name] Treating Britto, Verl Blalock, Physician/Extender: Referring Physician: Armida Sans in Treatment: 5 Active Inactive Abuse / Safety / Falls / Self Care Management Nursing Diagnoses: Impaired physical mobility Potential for falls Goals: Patient will remain injury free Date Initiated: 06/24/2015 Goal Status: Active Interventions: Assess fall risk on admission and as needed Notes: Necrotic Tissue Nursing Diagnoses: Impaired tissue integrity related to necrotic/devitalized  tissue Goals: Necrotic/devitalized tissue will be minimized in the wound bed Date Initiated: 06/24/2015 Goal Status: Active Interventions: Assess patient pain level pre-, during and post procedure and prior to discharge Treatment Activities: Apply topical anesthetic as ordered : 07/29/2015 Notes: Orientation to the Wound Care Program [name], [name] ([id number]) Nursing Diagnoses: Knowledge deficit related to the wound healing center program Goals: Patient/caregiver will verbalize understanding of the Wound Healing Center Program Date Initiated: 06/24/2015 Goal Status: Active Interventions: Provide education on orientation to the wound center Notes: Wound/Skin Impairment Nursing Diagnoses: Knowledge deficit related to ulceration/compromised skin integrity Goals: Ulcer/skin breakdown will heal within 14 weeks Date Initiated: 06/24/2015 Goal Status: Active Interventions: Provide education on ulcer and skin care Treatment Activities: Patient referred to home care : 07/29/2015 Skin care regimen initiated : 07/29/2015 Notes: Electronic Signature(s) Signed: 07/29/2015 4:12:03 PM By: [name] BSN, RN Entered By: [name] on 07/29/2015 13:10:39 Giroux, [name] ([id number]) -------------------------------------------------------------------------------- Pain Assessment Details Patient Name: THAYER, EMBLETON 07/29/2015 12:45 Date of Service: PM Medical Record [id number] Number: Patient Account Number: [id number] Date of Birth/Sex: 1940-08-24 (74 y.o. Male) Treating RN: [name] Mealy, RN, [name], [facility]  Chales Salmon, Other Clinician: Physician: [name] Treating Britto, Verl Blalock, Physician/Extender: Referring Physician: Armida Sans in Treatment: 5 Active Problems Location of Pain Severity and Description of Pain Patient Has Paino No Site Locations Pain Management and Medication Current Pain Management: Electronic Signature(s) Signed:  07/29/2015 4:12:03 PM By: [name] BSN, RN Entered By: [name] on 07/29/2015 12:58:56 Chalk, [name] ([id number]) -------------------------------------------------------------------------------- Patient/Caregiver Education Details Patient Name: ESCHOL, AUXIER 07/29/2015 12:45 Date of Service: PM Medical Record [id number] Number: Patient Account Number: [id number] Date of Birth/Gender: 1941-02-17 (74 y.o. Male) Treating RN: [name] Mealy, RN, [name], [facility], Other Clinician: Physician: [name] Treating Britto, Verl Blalock, Physician/Extender: Referring Physician: Armida Sans in Treatment: 5 Education Assessment Education Provided To: Patient Education Topics Provided Welcome To The Wound Care Center: Methods: Explain/Verbal Responses: State content correctly Wound/Skin Impairment: Methods: Explain/Verbal Responses: State content correctly Electronic Signature(s) Signed: 07/29/2015 4:12:03 PM By: [name] BSN, RN Entered By: [name] on 07/29/2015 13:12:45 Shipton, [name] ([id number]) -------------------------------------------------------------------------------- Wound Assessment Details Patient Name: SCORPIO, FORTIN 07/29/2015 12:45 Date of Service: PM Medical Record [id number] Number: Patient Account Number: [id number] Date of Birth/Sex: Jun 01, 1941 (74 y.o. Male) Treating RN: [name] Mealy, RN, [name], [facility], Other Clinician: Physician: [name] Treating Britto, Verl Blalock, Physician/Extender: Referring Physician: Armida Sans in Treatment: 5 Wound Status Wound Number: 1 Primary Lymphedema Etiology: Wound Location: Left Lower Leg - Lateral Secondary Trauma, Other Wounding Event: Trauma Etiology: Date Acquired: 05/09/2015 Wound Open Weeks Of Treatment: 5 Status: Clustered Wound: No Comorbid Lymphedema, Chronic Obstructive History: Pulmonary Disease (COPD), Sleep Apnea, Congestive Heart  Failure, Deep Vein Thrombosis, Hypertension, Seizure Disorder Photos Wound Measurements Length: (cm) 1.9 Width: (cm) 3 Depth: (cm) 0.3 Area: (cm) 4.477 Volume: (cm) 1.343 Recht, Jafar L. ([id number]) % Reduction in Area: 81.5% % Reduction in Volume: 96.3% Epithelialization: Medium (34-66%) Tunneling: No Wound Description Full Thickness Without Exposed Classification: Support Structures Wound Margin: Flat and Intact Exudate Large Amount: Exudate Type: Serosanguineous Exudate Color: red, brown Foul Odor After Cleansing: No Wound Bed Granulation Amount: Large (67-100%) Exposed Structure Granulation Quality: Red, Hyper-granulation Fascia Exposed: No Necrotic Amount: None Present (0%) Fat Layer Exposed: No Tendon Exposed: No Muscle Exposed: No Joint Exposed: No Bone Exposed: No Limited to Skin Breakdown Periwound Skin Texture Texture Color No Abnormalities Noted: No No Abnormalities Noted: No Callus: No Atrophie Blanche: No Crepitus: No Cyanosis: No Excoriation: No Ecchymosis: No Fluctuance: No Erythema: No Friable: No Hemosiderin Staining: Yes Induration: No Mottled: No Localized Edema: Yes Pallor: No Rash: No Rubor: No Scarring: No Temperature / Pain Moisture Temperature: No Abnormality No Abnormalities Noted: No Dry / Scaly: No Maceration: No Moist: Yes Wound Preparation Ulcer Cleansing: Other: soap and water, Topical Anesthetic Applied: Other: lidocaine 4%, Treatment Notes Wound #1 (Left, Lateral Lower Leg) 1. Cleansed with: Clean wound with Normal Saline 3. Peri-wound Care: ISHMAIL, MCMANAMON ([id number]) Moisturizing lotion 4. Dressing Applied: Prisma Ag Electronic Signature(s) Signed: 07/30/2015 4:32:12 PM By: [name] BSN, RN Previous Signature: 07/29/2015 4:12:03 PM Version By: [name] BSN, RN Entered By: [name] on 07/30/2015 16:32:12 Paules, [name]  ([id number]) -------------------------------------------------------------------------------- Vitals Details Patient Name: TERRIS, GERMANO 07/29/2015 12:45 Date of Service: PM Medical Record [id number] Number: Patient Account Number: [id number] Date of Birth/Sex: October 01, 1940 (74 y.o. Male) Treating RN: [name] Mealy, RN, [name], [facility], Other Clinician: Physician: [name] Treating Britto, Verl Blalock, Physician/Extender: Referring Physician: Armida Sans in Treatment: 5 Vital Signs Time Taken: 12:59 Temperature (F): 97.8 Height (in): 70 Pulse (bpm): 74  Weight (lbs): 296 Respiratory Rate (breaths/min): 18 Body Mass Index (BMI): 42.5 Blood Pressure (mmHg): 148/71 Reference Range: 80 - 120 mg / dl Electronic Signature(s) Signed: 07/29/2015 4:12:03 PM By: [name] BSN, RN Entered By: [name] on 07/29/2015 13:02:06

## 2015-07-30 NOTE — Progress Notes (Addendum)
PARAG, DORTON ([id number]) Visit Report for 07/29/2015 Chief Complaint Document Details Patient Name: [name], [name] 07/29/2015 12:45 Date of Service: PM Medical Record [id number] Number: Patient Account Number: [id number] Date of Birth/Sex: Mar 23, 1941 (74 y.o. Male) Treating RN: Clover Mealy, RN, BSN, [facility], Other Clinician: Physician: RUPASHREE Treating Mckaylee Dimalanta, Verl Blalock, Physician/Extender: Referring Physician: Armida Sans in Treatment: 5 Information Obtained from: Patient Chief Complaint Patient presents to the wound care center for a consult due non healing wound. nonhealing wound of the left lower extremity which has been present for about a month and a half. Electronic Signature(s) Signed: 07/29/2015 1:21:43 PM By: Evlyn Kanner MD, FACS Entered By: Evlyn Kanner on 07/29/2015 13:21:43 Malmstrom, [name] ([id number]) -------------------------------------------------------------------------------- HPI Details Patient Name: [name], [name] 07/29/2015 12:45 Date of Service: PM Medical Record [id number] Number: Patient Account Number: [id number] Date of Birth/Sex: November 28, 1940 (74 y.o. Male) Treating RN: Clover Mealy, RN, BSN, [facility], Other Clinician: Physician: RUPASHREE Treating Monya Kozakiewicz, Verl Blalock, Physician/Extender: Referring Physician: Armida Sans in Treatment: 5 History of Present Illness Location: left lower extremity wound Quality: Patient reports experiencing a dull pain to affected area(s). Severity: Patient states wound are getting worse. Duration: Patient has had the wound for < 6 weeks prior to presenting for treatment Timing: Pain in wound is Intermittent (comes and goes Context: The wound occurred when the patient had a fall and cut his leg which required several sutures. He is also been on several antibiotics including ciprofloxacin and clindamycin. Modifying Factors: Consults to this  date include:he has had bilateral lower limb lymphedema and has been using lymphedema pumps. Associated Signs and Symptoms: Patient reports having difficulty standing for long periods. HPI Description: 74 year old gentleman who has a history of seizures and schizophrenia with bipolar disorder enhances in a assisted living facility. He also has CHF, COPD, GERD and remote history of DVT. He has been treated by the vascular group at [facility] for bilateral lymphedema and has been using lymphedema pumps. This problem started on September 30 when he had a lacerated wound after a fall. The staples were left in for about 10 days and when the staples were removed the wound opened out. Since then he's had several issues with wound infection and the most recent culture showed a heavy growth of Pseudomonas aeruginosa and Staphylococcus aureus and he has received clindamycin and the most recent past and from today he is on levofloxacin. Addendum:Notes were reviewed from a previous vascular consult at [facility] by Dr. Gilda Crease who saw him on 05/27/2014. He had recommended graduated compression stockings class I and also added lymph pumps to improve the control of the patient's lymphedema. 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 Diffuse soft-tissue swelling correlate with bone scan recommended. 07/15/2015 -- he is still awaiting insurance clearance for his MRI and other than that he has no other changes. 07/21/2015 -- he now has the insurance clearance and his MRI has been scheduled for December 28th. Electronic Signature(s) Signed: 07/29/2015 1:21:49 PM By: Evlyn Kanner MD, FACS Entered By: Evlyn Kanner on 07/29/2015 13:21:48 CHEVON, LAUFER ([id number]) -------------------------------------------------------------------------------- Physical Exam Details Patient Name:  [name], [name] 07/29/2015 12:45 Date of Service: PM Medical Record [id number] Number: Patient Account Number: [id number] Date of Birth/Sex: Oct 21, 1940 (74 y.o. Male) Treating RN: Clover Mealy, RN, BSN, [facility], Other Clinician: Physician: RUPASHREE Treating Marykate Heuberger, Verl Blalock, Physician/Extender: Referring Physician: Armida Sans in Treatment: 5  Constitutional . Pulse regular. Respirations normal and unlabored. Afebrile. . Eyes Nonicteric. Reactive to light. Ears, Nose, Mouth, and Throat Lips, teeth, and gums WNL.Marland Kitchen Moist mucosa without lesions. Neck supple and nontender. No palpable supraclavicular or cervical adenopathy. Normal sized without goiter. Respiratory WNL. No retractions.. Cardiovascular Pedal Pulses WNL. No clubbing, cyanosis or edema. Lymphatic No adneopathy. No adenopathy. No adenopathy. Musculoskeletal Adexa without tenderness or enlargement.. Digits and nails w/o clubbing, cyanosis, infection, petechiae, ischemia, or inflammatory conditions.. Integumentary (Hair, Skin) No suspicious lesions. No crepitus or fluctuance. No peri-wound warmth or erythema. No masses.Marland Kitchen Psychiatric Judgement and insight Intact.. No evidence of depression, anxiety, or agitation.. Notes the wound is looking very good healthy granulation tissue and his edema has come down significantly. Electronic Signature(s) Signed: 07/29/2015 1:23:19 PM By: Evlyn Kanner MD, FACS Entered By: Evlyn Kanner on 07/29/2015 13:23:19 Lucken, [name] ([id number]) -------------------------------------------------------------------------------- Physician Orders Details Patient Name: [name], [name] 07/29/2015 12:45 Date of Service: PM Medical Record [id number] Number: Patient Account Number: [id number] Date of Birth/Sex: 1941/01/20 (74 y.o. Male) Treating RN: Clover Mealy, RN, BSN, [facility], Other Clinician: Physician: RUPASHREE Treating Sinead Hockman,  Verl Blalock, Physician/Extender: Referring Physician: Armida Sans in Treatment: 5 Verbal / Phone Orders: Yes Clinician: Afful, RN, BSN, Rita Read Back and Verified: Yes Diagnosis Coding Wound Cleansing Wound #1 Left,Lateral Lower Leg o Clean wound with Normal Saline. Anesthetic Wound #1 Left,Lateral Lower Leg o Topical Lidocaine 4% cream applied to wound bed prior to debridement Primary Wound Dressing Wound #1 Left,Lateral Lower Leg o Prisma Ag - or collagen with silver equivalent Secondary Dressing Wound #1 Left,Lateral Lower Leg o ABD pad Dressing Change Frequency Wound #1 Left,Lateral Lower Leg o Change dressing every week o Other: - Home health to change wrap if it slides down more than 1 inch; caregiver with call HHRN if necessary. Follow-up Appointments Wound #1 Left,Lateral Lower Leg o Return Appointment in 1 week. Edema Control Wound #1 Left,Lateral Lower Leg o Unna Boot to Left Lower Extremity - Pink paste o Elevate legs to the level of the heart and pump ankles as often as possible o Compression Pump: Use compression pump on left lower extremity for 30 minutes, twice daily. Saliba, Keon L. ([id number]) Additional Orders / Instructions Wound #1 Left,Lateral Lower Leg o Increase protein intake. o Activity as tolerated Home Health Wound #1 Left,Lateral Lower Leg o Continue Home Health Visits - HHRN to be notified to change wrap if it slides more than one inch. o Home Health Nurse may visit PRN to address patientos wound care needs. o FACE TO FACE ENCOUNTER: MEDICARE and MEDICAID PATIENTS: I certify that this patient is under my care and that I had a face-to-face encounter that meets the physician face-to-face encounter requirements with this patient on this date. The encounter with the patient was in whole or in part for the following MEDICAL CONDITION: (primary reason for Home Healthcare) MEDICAL NECESSITY: I certify,  that based on my findings, NURSING services are a medically necessary home health service. HOME BOUND STATUS: I certify that my clinical findings support that this patient is homebound (i.e., Due to illness or injury, pt requires aid of supportive devices such as crutches, cane, wheelchairs, walkers, the use of special transportation or the assistance of another person to leave their place of residence. There is a normal inability to leave the home and doing so requires considerable and taxing effort. Other absences are for medical reasons / religious services and are infrequent or of short duration when for  other reasons). o If current dressing causes regression in wound condition, may D/C ordered dressing product/s and apply Normal Saline Moist Dressing daily until next Wound Healing Center / Other MD appointment. Notify Wound Healing Center of regression in wound condition at [phone]. o Please direct any NON-WOUND related issues/requests for orders to patient's Primary Care Physician Electronic Signature(s) Signed: 07/29/2015 4:08:32 PM By: Evlyn KannerBritto, Victoire Deans MD, FACS Signed: 07/29/2015 4:12:03 PM By: Elpidio EricAfful, Rita BSN, RN Entered By: Elpidio EricAfful, Rita on 07/29/2015 13:11:33 Mccrae, Doy HutchingJERRY L. ([id number]) -------------------------------------------------------------------------------- Problem List Details Patient Name: Cephas DarbyCRAWFORD, Mary L. 07/29/2015 12:45 Date of Service: PM Medical Record [id number] Number: Patient Account Number: [id number] Date of Birth/Sex: 25-Oct-1940 [age] y.o. 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 ([id number]) -------------------------------------------------------------------------------- Progress Note Details Patient Name: Cephas DarbyCRAWFORD, Gibson L. 07/29/2015 12:45 Date of Service: PM Medical Record [id number] Number: Patient Account Number: [id number] Date of Birth/Sex: 25-Oct-1940 1(74 y.o. Male) Treating RN: Clover MealyAfful, RN, BSN, [facility], Other Clinician: Physician: RUPASHREE Treating Donalee Gaumond, Verl BlalockErrol VARADARAJAN, Physician/Extender: Referring Physician: Armida SansUPASHREE Weeks in Treatment: 5 Subjective Chief Complaint Information obtained from Patient Patient presents to the wound care center for a consult due non healing wound. nonhealing wound of the left lower extremity which has been present for about a month and a half. History of Present Illness (HPI) The following HPI elements were documented for the patient's wound: Location: left lower extremity wound Quality: Patient reports experiencing a dull pain to affected area(s). Severity: Patient states wound are getting worse. Duration: Patient has had the wound for < 6 weeks prior to presenting for treatment Timing: Pain in wound is Intermittent (comes and goes Context: The wound occurred when the patient had a fall and cut his leg which required several sutures. He is also been on several antibiotics including ciprofloxacin and clindamycin. Modifying Factors: Consults to this date include:he has had bilateral lower limb lymphedema and has been using lymphedema pumps. Associated Signs and Symptoms:  Patient reports having difficulty standing for long periods. 74 year old gentleman who has a history of seizures and schizophrenia with bipolar disorder enhances in a assisted living facility. He also has CHF, COPD, GERD and remote history of DVT. He has been [facility] group [facility] for bilateral lymphedema and has been using lymphedema pumps. This problem started on September 30 when he had a lacerated wound after a fall. The staples were left in for about 10 days and when the staples were removed the wound opened out. Since then he's had several issues with wound infection and the most recent culture showed a heavy growth of Pseudomonas aeruginosa and Staphylococcus aureus and he has received clindamycin and the most recent past and from today he is on levofloxacin. Addendum:Notes were reviewed from a [facility] by Dr. Gilda CreaseSchnier who saw him on 05/27/2014. He had recommended graduated compression stockings class I and also added lymph pumps  to improve the control of the patient's lymphedema. 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 Diffuse soft-tissue swelling correlate with bone scan recommended. 07/15/2015 -- he is still awaiting insurance clearance for his MRI and other than that he has no other changes. FORRESTER, BLANDO ([id number]) 07/21/2015 -- he now has the insurance clearance and his MRI has been scheduled for December 28th. Objective Constitutional Pulse regular. Respirations normal and unlabored. Afebrile. Vitals Time Taken: 12:59 PM, Height: 70 in, Weight: 296 lbs, BMI: 42.5, Temperature: 97.8 F, Pulse: 74 bpm, Respiratory Rate: 18 breaths/min, Blood Pressure: 148/71 mmHg. Eyes Nonicteric. Reactive to light. Ears, Nose, Mouth, and Throat Lips, teeth, and gums WNL.Marland Kitchen Moist mucosa without lesions. Neck supple  and nontender. No palpable supraclavicular or cervical adenopathy. Normal sized without goiter. Respiratory WNL. No retractions.. Cardiovascular Pedal Pulses WNL. No clubbing, cyanosis or edema. Lymphatic No adneopathy. No adenopathy. No adenopathy. Musculoskeletal Adexa without tenderness or enlargement.. Digits and nails w/o clubbing, cyanosis, infection, petechiae, ischemia, or inflammatory conditions.Marland Kitchen Psychiatric Judgement and insight Intact.. No evidence of depression, anxiety, or agitation.. General Notes: the wound is looking very good healthy granulation tissue and his edema has come down significantly. Integumentary (Hair, Skin) No suspicious lesions. No crepitus or fluctuance. No peri-wound warmth or erythema. No masses.. Wound #1 status is Open. Original cause of wound was Trauma. The wound is located on the Left,Lateral Lower Leg. The wound measures 1.9cm length x 3cm width x 0.3cm depth; 4.477cm^2 area and W[place]uth, Acelin L. ([id number]) 1.343cm^3 volume. The wound is limited to skin breakdown. There is no tunneling noted. There is a large amount of serosanguineous drainage noted. The wound margin is flat and intact. There is large (67-100%) red granulation within the wound bed. There is no necrotic tissue within the wound bed. The periwound skin appearance exhibited: Localized Edema, Moist, Hemosiderin Staining. The periwound skin appearance did not exhibit: Callus, Crepitus, Excoriation, Fluctuance, Friable, Induration, Rash, Scarring, Dry/Scaly, Maceration, Atrophie Blanche, Cyanosis, Ecchymosis, Mottled, Pallor, Rubor, Erythema. Periwound temperature was noted as No Abnormality. Assessment Active Problems ICD-10 I89.0 - Lymphedema, not elsewhere classified L97.222 - Non-pressure chronic ulcer of left calf with fat layer exposed S81.811A - Laceration without foreign body, right lower leg, initial encounter E66.01 - Morbid (severe) obesity due to excess calories I50.9  - Heart failure, unspecified I have recommended to use Prisma AG and bolster this in place and use an Unna's boot. He is also using his lymphedema pumps twice a day and will come back and see as next week. Plan Wound Cleansing: Wound #1 Left,Lateral Lower Leg: Clean wound with Normal Saline. Anesthetic: Wound #1 Left,Lateral Lower Leg: Topical Lidocaine 4% cream applied to wound bed prior to debridement Primary Wound Dressing: Wound #1 Left,Lateral Lower Leg: Prisma Ag - or collagen with silver equivalent Secondary Dressing: Wound #1 Left,Lateral Lower Leg: ABD pad Dressing Change Frequency: Wound #1 Left,Lateral Lower Leg: Blick, Dontrell L. ([id number]) Change dressing every week Other: - Home health to change wrap if it slides down more than 1 inch; caregiver with call HHRN if necessary. Follow-up Appointments: Wound #1 Left,Lateral Lower Leg: Return Appointment in 1 week. Edema Control: Wound #1 Left,Lateral Lower Leg: Unna Boot to Left Lower Extremity - Pink paste Elevate legs to the level of the heart and pump ankles as often as possible Compression Pump: Use compression pump on left lower extremity for 30 minutes, twice daily. Additional Orders / Instructions:  Wound #1 Left,Lateral Lower Leg: Increase protein intake. Activity as tolerated Home Health: Wound #1 Left,Lateral Lower Leg: Continue Home Health Visits - HHRN to be notified to change wrap if it slides more than one inch. Home Health Nurse may visit PRN to address patient s wound care needs. FACE TO FACE ENCOUNTER: MEDICARE and MEDICAID PATIENTS: I certify that this patient is under my care and that I had a face-to-face encounter that meets the physician face-to-face encounter requirements with this patient on this date. The encounter with the patient was in whole or in part for the following MEDICAL CONDITION: (primary reason for Home Healthcare) MEDICAL NECESSITY: I certify, that based on my findings, NURSING  services are a medically necessary home health service. HOME BOUND STATUS: I certify that my clinical findings support that this patient is homebound (i.e., Due to illness or injury, pt requires aid of supportive devices such as crutches, cane, wheelchairs, walkers, the use of special transportation or the assistance of another person to leave their place of residence. There is a normal inability to leave the home and doing so requires considerable and taxing effort. Other absences are for medical reasons / religious services and are infrequent or of short duration when for other reasons). If current dressing causes regression in wound condition, may D/C ordered dressing product/s and apply Normal Saline Moist Dressing daily until next Wound Healing Center / Other MD appointment. Notify Wound Healing Center of regression in wound condition at [phone]. Please direct any NON-WOUND related issues/requests for orders to patient's Primary Care Physician I have recommended to use Prisma AG and bolster this in place and use an Unna's boot. He is also using his lymphedema pumps twice a day and will come back and see as next week. Electronic Signature(s) Signed: 07/31/2015 5:10:54 PM By: Evlyn Kanner MD, FACS Previous Signature: 07/29/2015 1:23:40 PM Version By: Evlyn Kanner MD, FACS Mudry, [name] ([id number]) Entered By: Evlyn Kanner on 07/31/2015 17:10:54 Klauer, [name] ([id number]) -------------------------------------------------------------------------------- SuperBill Details Patient Name: [name] Date of Service: 07/29/2015 Medical Record Patient Account Number: [id number] Number: Afful, RN, BSN, Treating RN: Date of Birth/Sex: 07/04/1941 (74 y.o. Male) [facility], Other Clinician: Physician: RUPASHREE Treating Hayla Hinger, Verl Blalock, Physician/Extender: Referring Physician: Armida Sans in Treatment: 5 Diagnosis Coding ICD-10  Codes Code Description I89.0 Lymphedema, not elsewhere classified L97.222 Non-pressure chronic ulcer of left calf with fat layer exposed S81.811A Laceration without foreign body, right lower leg, initial encounter E66.01 Morbid (severe) obesity due to excess calories I50.9 Heart failure, unspecified Facility Procedures CPT4 Code: 86578469 Description: (Facility Use Only) 29580LT - APPLY UNNA BOOT LT Modifier: Quantity: 1 Physician Procedures CPT4 Code Description: [id number] 13244 - WC PHYS LEVEL 3 - EST PT ICD-10 Description Diagnosis I89.0 Lymphedema, not elsewhere classified 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

## 2015-08-06 ENCOUNTER — Ambulatory Visit
Admission: RE | Admit: 2015-08-06 | Discharge: 2015-08-06 | Disposition: A | Discharge status: Home / Self Care | Payer: Medicare Other | Source: Ambulatory Visit | Attending: Surgery | Admitting: Surgery

## 2015-08-06 DIAGNOSIS — X58XXXA Exposure to other specified factors, initial encounter: Secondary | ICD-10-CM | POA: Insufficient documentation

## 2015-08-06 DIAGNOSIS — M869 Osteomyelitis, unspecified: Secondary | ICD-10-CM

## 2015-08-06 DIAGNOSIS — S81802A Unspecified open wound, left lower leg, initial encounter: Secondary | ICD-10-CM | POA: Diagnosis not present

## 2015-08-07 ENCOUNTER — Encounter (HOSPITAL_BASED_OUTPATIENT_CLINIC_OR_DEPARTMENT_OTHER): Payer: Medicare Other | Admitting: General Surgery

## 2015-08-07 ENCOUNTER — Encounter: Payer: Self-pay | Admitting: General Surgery

## 2015-08-07 DIAGNOSIS — I83021 Varicose veins of left lower extremity with ulcer of thigh: Secondary | ICD-10-CM | POA: Diagnosis not present

## 2015-08-07 DIAGNOSIS — L97129 Non-pressure chronic ulcer of left thigh with unspecified severity: Principal | ICD-10-CM

## 2015-08-07 DIAGNOSIS — L97222 Non-pressure chronic ulcer of left calf with fat layer exposed: Secondary | ICD-10-CM | POA: Diagnosis not present

## 2015-08-07 NOTE — Progress Notes (Signed)
seeiheal 

## 2015-08-08 NOTE — Progress Notes (Signed)
ORLYN, ODONOGHUE ([id number]) Visit Report for 08/07/2015 Chief Complaint Document Details Patient Name: [name], [name] 08/07/2015 1:45 Date of Service: PM Medical Record [id number] Number: Patient Account Number: [id number] Date of Birth/Sex: 1941-07-03 (74 y.o. Male) Afful, RN, BSN, Treating RN: [place] [facility] Chales Salmon, Physician: Soyla Murphy Other Clinician: Chales Salmon, Treating Referring Physician: Ardath Sax RUPASHREE Physician/Extender: Tania Ade in Treatment: 6 Information Obtained from: Patient Chief Complaint Patient presents to the wound care center for a consult due non healing wound. nonhealing wound of the left lower extremity which has been present for about a month and a half. Electronic Signature(s) Signed: 08/07/2015 1:12:26 PM By: Ardath Sax MD Entered By: Ardath Sax on 08/07/2015 13:12:25 [place], [name] ([id number]) -------------------------------------------------------------------------------- HPI Details Patient Name: [name], [name] 08/07/2015 1:45 Date of Service: PM Medical Record [id number] Number: Patient Account Number: [id number] Date of Birth/Sex: 1941/04/01 (74 y.o. Male) Afful, RN, BSN, Treating RN: [place] [facility] Chales Salmon, Physician: Soyla Murphy Other ClinicianChales Salmon, Treating Referring Physician: Ardath Sax RUPASHREE Physician/Extender: Weeks in Treatment: 6 History of Present Illness Location: left lower extremity wound Quality: Patient reports experiencing a dull pain to affected area(s). Severity: Patient states wound are getting worse. Duration: Patient has had the wound for < 6 weeks prior to presenting for treatment Timing: Pain in wound is Intermittent (comes and goes Context: The wound occurred when the patient had a fall and cut his leg which required several sutures. He is also been on several antibiotics including ciprofloxacin and clindamycin. Modifying Factors: Consults to this date  include:he has had bilateral lower limb lymphedema and has been using lymphedema pumps. Associated Signs and Symptoms: Patient reports having difficulty standing for long periods. HPI Description: 74 year old gentleman who has a history of seizures and schizophrenia with bipolar disorder enhances in a assisted living facility. He also has CHF, COPD, GERD and remote history of DVT. He has been treated by the vascular group at [facility] for bilateral lymphedema and has been using lymphedema pumps. This problem started on September 30 when he had a lacerated wound after a fall. The staples were left in for about 10 days and when the staples were removed the wound opened out. Since then he's had several issues with wound infection and the most recent culture showed a heavy growth of Pseudomonas aeruginosa and Staphylococcus aureus and he has received clindamycin and the most recent past and from today he is on levofloxacin. Addendum:Notes were reviewed from a previous vascular consult at [facility] by Dr. Gilda Crease who [name] him on 05/27/2014. He had recommended graduated compression stockings class I and also added lymph pumps to improve the control of the patient's lymphedema. 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 Diffuse soft-tissue swelling correlate with bone scan recommended. 07/15/2015 -- he is still awaiting insurance clearance for his MRI and other than that he has no other changes. 07/21/2015 -- he now has the insurance clearance and his MRI has been scheduled for December 28th. Electronic Signature(s) Signed: 08/07/2015 1:12:57 PM By: Ardath Sax MD Entered By: Ardath Sax on 08/07/2015 13:12:57 Fabel, [name] ([id number]) -------------------------------------------------------------------------------- Physical Exam Details Patient Name: [name], [name]  08/07/2015 1:45 Date of Service: PM Medical Record [id number] Number: Patient Account Number: [id number] Date of Birth/Sex: [birth year] (74 y.o. Male) Afful, RN, BSN, Treating RN: [place] [facility] Chales Salmon, Physician: Soyla Murphy Other ClinicianChales Salmon, Treating Referring Physician: Ardath Sax RUPASHREE Physician/Extender: Tania Ade in Treatment: 6 Electronic Signature(s)  Signed: 08/07/2015 1:13:09 PM By: Ardath Sax MD Entered By: Ardath Sax on 08/07/2015 13:13:09 [place], [name] ([id number]) -------------------------------------------------------------------------------- Physician Orders Details Patient Name: [name], [name] 08/07/2015 1:45 Date of Service: PM Medical Record [id number] Number: Patient Account Number: [id number] Date of Birth/Sex: May 05, 1941 (74 y.o. Male) Afful, RN, BSN, Treating RN: [place] [facility] Chales Salmon, Physician: Soyla Murphy Other ClinicianChales Salmon, Treating Referring Physician: Ardath Sax RUPASHREE Physician/Extender: Tania Ade in Treatment: 6 Verbal / Phone Orders: Yes Clinician: Afful, RN, BSN, Rita Read Back and Verified: Yes Diagnosis Coding Wound Cleansing Wound #1 Left,Lateral Lower Leg o Clean wound with Normal Saline. Anesthetic Wound #1 Left,Lateral Lower Leg o Topical Lidocaine 4% cream applied to wound bed prior to debridement Primary Wound Dressing Wound #1 Left,Lateral Lower Leg o Prisma Ag - or collagen with silver equivalent Secondary Dressing Wound #1 Left,Lateral Lower Leg o ABD pad Dressing Change Frequency Wound #1 Left,Lateral Lower Leg o Change dressing every week o Other: - Home health to change wrap if it slides down more than 1 inch; caregiver with call HHRN if necessary. Follow-up Appointments Wound #1 Left,Lateral Lower Leg o Return Appointment in 1 week. Edema Control Wound #1 Left,Lateral Lower Leg o Unna Boot to Left Lower Extremity - Pink paste o Elevate legs  to the level of the heart and pump ankles as often as possible o Compression Pump: Use compression pump on left lower extremity for 30 minutes, twice daily. Hoare, Derk L. ([id number]) Additional Orders / Instructions Wound #1 Left,Lateral Lower Leg o Increase protein intake. o Activity as tolerated Home Health Wound #1 Left,Lateral Lower Leg o Continue Home Health Visits - HHRN to be notified to change wrap if it slides more than one inch. o Home Health Nurse may visit PRN to address patientos wound care needs. o FACE TO FACE ENCOUNTER: MEDICARE and MEDICAID PATIENTS: I certify that this patient is under my care and that I had a face-to-face encounter that meets the physician face-to-face encounter requirements with this patient on this date. The encounter with the patient was in whole or in part for the following MEDICAL CONDITION: (primary reason for Home Healthcare) MEDICAL NECESSITY: I certify, that based on my findings, NURSING services are a medically necessary home health service. HOME BOUND STATUS: I certify that my clinical findings support that this patient is homebound (i.e., Due to illness or injury, pt requires aid of supportive devices such as crutches, cane, wheelchairs, walkers, the use of special transportation or the assistance of another person to leave their place of residence. There is a normal inability to leave the home and doing so requires considerable and taxing effort. Other absences are for medical reasons / religious services and are infrequent or of short duration when for other reasons). o If current dressing causes regression in wound condition, may D/C ordered dressing product/s and apply Normal Saline Moist Dressing daily until next Wound Healing Center / Other MD appointment. Notify Wound Healing Center of regression in wound condition at [phone]. o Please direct any NON-WOUND related issues/requests for orders to patient's  Primary Care Physician Electronic Signature(s) Signed: 08/07/2015 3:28:31 PM By: Elpidio Eric BSN, RN Signed: 08/08/2015 8:01:58 AM By: Ardath Sax MD Entered By: Elpidio Eric on 08/07/2015 13:03:25 [place], [name] ([id number]) -------------------------------------------------------------------------------- Problem List Details Patient Name: [name], [name] 08/07/2015 1:45 Date of Service: PM Medical Record [id number] Number: Patient Account Number: [id number] Date of Birth/Sex: Jan 01, 1941 (74 y.o. 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 ([id number]) -------------------------------------------------------------------------------- Progress Note Details Patient Name: [name], [name] 08/07/2015 1:45 Date of Service: PM Medical Record [id number] Number: Patient Account Number: [id number] Date of Birth/Sex: [birth year] (74 y.o. Male) Afful, RN, BSN, Treating RN: [place] [facility] Primary [facility] Chales SalmonVARADARAJAN, Physician: Soyla MurphyUPASHREE Other ClinicianChales Salmon: VARADARAJAN, Treating Referring Physician: Ardath SaxPARKER, Rebbie Lauricella RUPASHREE Physician/Extender: Weeks in Treatment: 6 Subjective Chief Complaint Information obtained from  Patient Patient presents to the wound care center for a consult due non healing wound. nonhealing wound of the left lower extremity which has been present for about a month and a half. History of Present Illness (HPI) The following HPI elements were documented for the patient's wound: Location: left lower extremity wound Quality: Patient reports experiencing a dull pain to affected area(s). Severity: Patient states wound are getting worse. Duration: Patient has had the wound for < 6 weeks prior to presenting for treatment Timing: Pain in wound is Intermittent (comes and goes Context: The wound occurred when the patient had a fall and cut his leg which required several sutures. He is also been on several antibiotics including ciprofloxacin and clindamycin. Modifying Factors: Consults to this date include:he has had bilateral lower limb lymphedema and has been using lymphedema pumps. Associated Signs and Symptoms: Patient reports having difficulty standing for long periods. 74 year old gentleman who has a history of seizures and schizophrenia with bipolar disorder enhances in a assisted living facility. He also has CHF, COPD, GERD and remote history of DVT. He has been treated by the [facility] group at [facility] for bilateral lymphedema and has been using lymphedema pumps. This problem started on September 30 when he had a lacerated wound after a fall. The staples were left in for about 10 days and when the staples were removed the wound opened out. Since then he's had several issues with wound infection and the most recent culture showed a heavy growth of Pseudomonas aeruginosa and Staphylococcus aureus and he has received clindamycin and the most recent past and from today he is on levofloxacin. Addendum:Notes were reviewed from a [facility] by Dr. Gilda CreaseSchnier who [name] him on 05/27/2014. He had recommended graduated compression stockings class I and also  added lymph pumps to improve the control of the patient's lymphedema. 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 Diffuse soft-tissue swelling correlate with bone scan recommended. 07/15/2015 -- he is still awaiting insurance clearance for his MRI and other than that he has no other changes. [name], [name] ([id number]) 07/21/2015 -- he now has the insurance clearance and his MRI has been scheduled for December 28th. Objective Constitutional Vitals Time Taken: 12:54 PM, Height: 70 in, Weight: 296 lbs, BMI: 42.5, Temperature: 98.4 F, Pulse: 73 bpm, Respiratory Rate: 20 breaths/min, Blood Pressure: 139/71 mmHg. Integumentary (Hair, Skin) Wound #1 status is Open. Original cause of wound was Trauma. The wound is located on the Left,Lateral Lower Leg. The  wound measures 0.7cm length x 1.3cm width x 0.2cm depth; 0.715cm^2 area and 0.143cm^3 volume. The wound is limited to skin breakdown. There is no tunneling or undermining noted. There is a large amount of serosanguineous drainage noted. The wound margin is flat and intact. There is large (67-100%) red granulation within the wound bed. There is no necrotic tissue within the wound bed. The periwound skin appearance exhibited: Localized Edema, Moist, Hemosiderin Staining. The periwound skin appearance did not exhibit: Callus, Crepitus, Excoriation, Fluctuance, Friable, Induration, Rash, Scarring, Dry/Scaly, Maceration, Atrophie Blanche, Cyanosis, Ecchymosis, Mottled, Pallor, Rubor, Erythema. Periwound temperature was noted as No Abnormality. Assessment Active Problems ICD-10 I89.0 - Lymphedema, not elsewhere classified L97.222 - Non-pressure chronic ulcer of left calf with fat layer exposed S81.811A - Laceration without foreign body, right lower leg, initial encounter E66.01 - Morbid (severe) obesity due to excess calories I50.9 - Heart  failure, unspecified Plan Wound Cleansing: Wound #1 Left,Lateral Lower Leg: Clean wound with Normal Saline. [name], [name] ([id number]) Anesthetic: Wound #1 Left,Lateral Lower Leg: Topical Lidocaine 4% cream applied to wound bed prior to debridement Primary Wound Dressing: Wound #1 Left,Lateral Lower Leg: Prisma Ag - or collagen with silver equivalent Secondary Dressing: Wound #1 Left,Lateral Lower Leg: ABD pad Dressing Change Frequency: Wound #1 Left,Lateral Lower Leg: Change dressing every week Other: - Home health to change wrap if it slides down more than 1 inch; caregiver with call HHRN if necessary. Follow-up Appointments: Wound #1 Left,Lateral Lower Leg: Return Appointment in 1 week. Edema Control: Wound #1 Left,Lateral Lower Leg: Unna Boot to Left Lower Extremity - Pink paste Elevate legs to the level of the heart and pump ankles as often as possible Compression Pump: Use compression pump on left lower extremity for 30 minutes, twice daily. Additional Orders / Instructions: Wound #1 Left,Lateral Lower Leg: Increase protein intake. Activity as tolerated Home Health: Wound #1 Left,Lateral Lower Leg: Continue Home Health Visits - HHRN to be notified to change wrap if it slides more than one inch. Home Health Nurse may visit PRN to address patient s wound care needs. FACE TO FACE ENCOUNTER: MEDICARE and MEDICAID PATIENTS: I certify that this patient is under my care and that I had a face-to-face encounter that meets the physician face-to-face encounter requirements with this patient on this date. The encounter with the patient was in whole or in part for the following MEDICAL CONDITION: (primary reason for Home Healthcare) MEDICAL NECESSITY: I certify, that based on my findings, NURSING services are a medically necessary home health service. HOME BOUND STATUS: I certify that my clinical findings support that this patient is homebound (i.e., Due to illness or injury,  pt requires aid of supportive devices such as crutches, cane, wheelchairs, walkers, the use of special transportation or the assistance of another person to leave their place of residence. There is a normal inability to leave the home and doing so requires considerable and taxing effort. Other absences are for medical reasons / religious services and are infrequent or of short duration when for other reasons). If current dressing causes regression in wound condition, may D/C ordered dressing product/s and apply Normal Saline Moist Dressing daily until next Wound Healing Center / Other MD appointment. Notify Wound Healing Center of regression in wound condition at [phone]. Please direct any NON-WOUND related issues/requests for orders to patient's Primary Care Physician Follow-Up Appointments: A Patient Clinical Summary of Care was provided to [facility]. 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  Treat with collagen and compression Electronic Signature(s) Signed: 08/07/2015 1:15:25 PM By: Ardath Sax MD Entered By: Ardath Sax on 08/07/2015 13:15:25 Dade, [name] ([id number]) -------------------------------------------------------------------------------- SuperBill Details Patient Name: [name] Darby Date of Service: 08/07/2015 Medical Record Patient Account Number: [id number] Number: Afful, RN, BSN, Treating RN: Date of Birth/Sex: 1940-10-04 (74 y.o. Male) [facility], Other Clinician: Physician: RUPASHREE Treating Christiana Pellant, Physician/Extender: Referring Physician: Armida Sans in Treatment: 6 Diagnosis Coding ICD-10 Codes Code Description I89.0 Lymphedema, not elsewhere classified L97.222 Non-pressure chronic ulcer of left calf with fat layer exposed S81.811A Laceration without foreign body, right lower leg, initial encounter E66.01 Morbid (severe) obesity due to excess  calories I50.9 Heart failure, unspecified Facility Procedures CPT4 Code: 29528413 Description: (Facility Use Only) 29580LT - Joetta Manners BOOT LT Modifier: Quantity: 1 Physician Procedures CPT4 Code: 2440102 Description: 72536 - WC PHYS LEVEL 2 - EST PT ICD-10 Description Diagnosis L97.222 Non-pressure chronic ulcer of left calf with fat laye Modifier: r exposed Quantity: 1 Electronic Signature(s) Signed: 08/07/2015 4:34:29 PM By: Curtis Sites Signed: 08/08/2015 8:01:58 AM By: Ardath Sax MD Previous Signature: 08/07/2015 1:15:52 PM Version By: Ardath Sax MD Entered By: Curtis Sites on 08/07/2015 16:34:29

## 2015-08-08 NOTE — Progress Notes (Signed)
ADVIT, TRETHEWEY ([id number]) Visit Report for 08/07/2015 Arrival Information Details Patient Name: [name], [name] 08/07/2015 1:45 Date of Service: PM Medical Record [id number] Number: Patient Account Number: [id number] Date of Birth/Sex: 1941/03/02 (74 y.o. Male) Afful, RN, BSN, Treating RN: [place] Sink Primary Care Chales Salmon, Physician: Soyla Murphy Other Clinician: Chales Salmon, Treating Referring Physician: Ardath Sax RUPASHREE Physician/Extender: Tania Ade in Treatment: 6 Visit Information History Since Last Visit Added or deleted any medications: No Patient Arrived: Walker Any new allergies or adverse reactions: No Arrival Time: 12:49 Had a fall or experienced change in No Accompanied By: caregivwer activities of daily living that may affect Transfer Assistance: None risk of falls: Patient Identification Verified: Yes Signs or symptoms of abuse/neglect since last No Secondary Verification Process Yes visito Completed: Has Dressing in Place as Prescribed: Yes Patient Requires Transmission- Yes Has Compression in Place as Prescribed: Yes Based Precautions: Pain Present Now: No Transmission-Based Contact MRSA Precautions: Patient Has Alerts: Yes Patient Alerts: NOT Diabetic [facility] schizophrenia and bipolar Electronic Signature(s) Signed: 08/07/2015 3:28:31 PM By: Elpidio Eric BSN, RN Entered By: Elpidio Eric on 08/07/2015 12:52:43 Rokosz, [name] ([id number]) -------------------------------------------------------------------------------- Encounter Discharge Information Details Patient Name: [name] 08/07/2015 1:45 Date of Service: PM Medical Record [id number] Number: Patient Account Number: [id number] Date of Birth/Sex: 05-06-1941 (74 y.o. Male) Afful, RN, BSN, Treating RN: [place] Sink Primary Care Chales Salmon, Physician: RUPASHREE Other ClinicianChales Salmon, Treating Referring Physician: Ardath Sax RUPASHREE  Physician/Extender: Weeks in Treatment: 6 Encounter Discharge Information Items Discharge Pain Level: 0 Discharge Condition: Stable Ambulatory Status: Walker Discharge Destination: Home Transportation: Private Auto caregiver Accompanied By: queen Schedule Follow-up Appointment: No Medication Reconciliation completed and provided to Patient/Care No Monie Shere: Provided on Clinical Summary of Care: 08/07/2015 Form Type Recipient Paper Patient jc Electronic Signature(s) Signed: 08/07/2015 1:16:21 PM By: Ardath Sax MD Previous Signature: 08/07/2015 1:11:44 PM Version By: Francie Massing Entered By: Ardath Sax on 08/07/2015 13:16:21 [place], [name] ([id number]) -------------------------------------------------------------------------------- Lower Extremity Assessment Details Patient Name: [name] 08/07/2015 1:45 Date of Service: PM Medical Record [id number] Number: Patient Account Number: [id number] Date of Birth/Sex: 1941/02/01 (74 y.o. Male) Afful, RN, BSN, Treating RN: [place] Sink Primary Care Chales Salmon, Physician: RUPASHREE Other ClinicianChales Salmon, Treating Referring Physician: Ardath Sax RUPASHREE Physician/Extender: Weeks in Treatment: 6 Edema Assessment Assessed: [Left: No] [Right: No] E[Left: dema] [Right: :] Calf Left: Right: Point of Measurement: 33 cm From Medial Instep 46.7 cm cm Ankle Left: Right: Point of Measurement: 11 cm From Medial Instep 35.2 cm cm Vascular Assessment Claudication: Claudication Assessment [Left:None] Pulses: Posterior Tibial Dorsalis Pedis Palpable: [Left:Yes] Extremity colors, hair growth, and conditions: Extremity Color: [Left:Mottled] Hair Growth on Extremity: [Left:No] Temperature of Extremity: [Left:Warm] Capillary Refill: [Left:< 3 seconds] Toe Nail Assessment Left: Right: Thick: Yes Discolored: Yes Deformed: Yes Improper Length and Hygiene: Yes Electronic Signature(s) ARLEN, DUPUIS  ([id number]) Signed: 08/07/2015 3:28:31 PM By: Elpidio Eric BSN, RN Entered By: Elpidio Eric on 08/07/2015 12:56:33 Capuano, [name] ([id number]) -------------------------------------------------------------------------------- Multi Wound Chart Details Patient Name: [name] 08/07/2015 1:45 Date of Service: PM Medical Record [id number] Number: Patient Account Number: [id number] Date of Birth/Sex: [birth year] (74 y.o. Male) Afful, RN, BSN, Treating RN: [place] Sink Primary Care Chales Salmon, Physician: Soyla Murphy Other ClinicianChales Salmon, Treating Referring Physician: Ardath Sax RUPASHREE Physician/Extender: Weeks in Treatment: 6 Vital Signs Height(in): 70 Pulse(bpm): 73 Weight(lbs): 296 Blood Pressure 139/71 (mmHg): Body Mass Index(BMI): 42 Temperature(F): 98.4 Respiratory Rate 20 (breaths/min): Photos: [1:No Photos] [N/A:N/A] Wound Location: [1:Left Lower Leg - Lateral] [N/A:N/A] Wounding Event: [1:Trauma] [N/A:N/A] Primary  Etiology: [1:Lymphedema] [N/A:N/A] Secondary Etiology: [1:Trauma, Other] [N/A:N/A] Comorbid History: [1:Lymphedema, Chronic Obstructive Pulmonary Disease (COPD), Sleep Apnea, Congestive Heart Failure, Deep Vein Thrombosis, Hypertension, Seizure Disorder] [N/A:N/A] Date Acquired: [1:05/09/2015] [N/A:N/A] Weeks of Treatment: [1:6] [N/A:N/A] Wound Status: [1:Open] [N/A:N/A] Measurements L x W x D 0.7x1.3x0.2 [N/A:N/A] (cm) Area (cm) : [1:0.715] [N/A:N/A] Volume (cm) : [1:0.143] [N/A:N/A] % Reduction in Area: [1:97.00%] [N/A:N/A] % Reduction in Volume: 99.60% [N/A:N/A] Classification: [1:Full Thickness Without Exposed Support Structures] [N/A:N/A] Exudate Amount: [1:Large] [N/A:N/A] Exudate Type: [1:Serosanguineous] [N/A:N/A] Exudate Color: red, brown N/A N/A Wound Margin: Flat and Intact N/A N/A Granulation Amount: Large (67-100%) N/A N/A Granulation Quality: Red, Hyper-granulation N/A N/A Necrotic Amount: None Present (0%) N/A  N/A Exposed Structures: Fascia: No N/A N/A Fat: No Tendon: No Muscle: No Joint: No Bone: No Limited to Skin Breakdown Epithelialization: Medium (34-66%) N/A N/A Periwound Skin Texture: Edema: Yes N/A N/A Excoriation: No Induration: No Callus: No Crepitus: No Fluctuance: No Friable: No Rash: No Scarring: No Periwound Skin Moist: Yes N/A N/A Moisture: Maceration: No Dry/Scaly: No Periwound Skin Color: Hemosiderin Staining: Yes N/A N/A Atrophie Blanche: No Cyanosis: No Ecchymosis: No Erythema: No Mottled: No Pallor: No Rubor: No Temperature: No Abnormality N/A N/A Tenderness on No N/A N/A Palpation: Wound Preparation: Ulcer Cleansing: Other: N/A N/A soap and water Topical Anesthetic Applied: Other: lidocaine 4% Treatment Notes Electronic Signature(s) Signed: 08/07/2015 3:28:31 PM By: Elpidio EricAfful, Rita BSN, RN Entered By: Elpidio EricAfful, Rita on 08/07/2015 13:02:57 Kibler, Doy HutchingJERRY L. ([id number]) Cephas DarbyCRAWFORD, Jmichael L. ([id number]) -------------------------------------------------------------------------------- Multi-Disciplinary Care Plan Details Patient Name: Cephas DarbyCRAWFORD, Kamani L. 08/07/2015 1:45 Date of Service: PM Medical Record [id number] Number: Patient Account Number: [id number] Date of Birth/Sex: Jul 18, 1941 (74 y.o. Male) Afful, RN, BSN, Treating RN: [place] Sinkita Primary Care Chales SalmonVARADARAJAN, Physician: Soyla MurphyUPASHREE Other ClinicianChales Salmon: VARADARAJAN, Treating Referring Physician: Ardath SaxPARKER, PETER RUPASHREE Physician/Extender: Tania AdeWeeks in Treatment: 6 Active Inactive Abuse / Safety / Falls / Self Care Management Nursing Diagnoses: Impaired physical mobility Potential for falls Goals: Patient will remain injury free Date Initiated: 06/24/2015 Goal Status: Active Interventions: Assess fall risk on admission and as needed Notes: Necrotic Tissue Nursing Diagnoses: Impaired tissue integrity related to necrotic/devitalized tissue Goals: Necrotic/devitalized tissue will be minimized in the  wound bed Date Initiated: 06/24/2015 Goal Status: Active Interventions: Assess patient pain level pre-, during and post procedure and prior to discharge Treatment Activities: Apply topical anesthetic as ordered : 08/07/2015 Notes: Orientation to the Wound Care Program Cephas DarbyCRAWFORD, Husayn L. ([id number]) Nursing Diagnoses: Knowledge deficit related to the wound healing center program Goals: Patient/caregiver will verbalize understanding of the Wound Healing Center Program Date Initiated: 06/24/2015 Goal Status: Active Interventions: Provide education on orientation to the wound center Notes: Wound/Skin Impairment Nursing Diagnoses: Knowledge deficit related to ulceration/compromised skin integrity Goals: Ulcer/skin breakdown will heal within 14 weeks Date Initiated: 06/24/2015 Goal Status: Active Interventions: Provide education on ulcer and skin care Treatment Activities: Patient referred to home care : 08/07/2015 Skin care regimen initiated : 08/07/2015 Notes: Electronic Signature(s) Signed: 08/07/2015 3:28:31 PM By: Elpidio EricAfful, Rita BSN, RN Entered By: Elpidio EricAfful, Rita on 08/07/2015 13:02:49 Bullen, Doy HutchingJERRY L. ([id number]) -------------------------------------------------------------------------------- Patient/Caregiver Education Details Patient Name: Cephas DarbyCRAWFORD, Zayd L. 08/07/2015 1:45 Date of Service: PM Medical Record [id number] Number: Patient Account Number: [id number] Date of Birth/Gender: Jul 18, 1941 (74 y.o. Male) Afful, RN, BSN, Treating RN: [place] Sinkita Primary Care Chales SalmonVARADARAJAN, Physician: Soyla MurphyUPASHREE Other ClinicianChales Salmon: VARADARAJAN, Treating Referring Physician: Ardath SaxPARKER, PETER RUPASHREE Physician/Extender: Tania AdeWeeks in Treatment: 6 Education Assessment Education Provided To: Patient Education Topics Provided Welcome To The Wound Care Center: Methods: Explain/Verbal Responses: State  content correctly Wound/Skin Impairment: Methods: Explain/Verbal Responses: State content  correctly Electronic Signature(s) Signed: 08/07/2015 1:16:28 PM By: Ardath Sax MD Entered By: Ardath Sax on 08/07/2015 13:16:28 Witczak, [name] ([id number]) -------------------------------------------------------------------------------- Wound Assessment Details Patient Name: BURHANUDDIN, KOHLMANN 08/07/2015 1:45 Date of Service: PM Medical Record [id number] Number: Patient Account Number: [id number] Date of Birth/Sex: 1941/04/01 (74 y.o. Male) Afful, RN, BSN, Treating RN: [place] Sink Primary Care Chales Salmon, Physician: RUPASHREE Other ClinicianChales Salmon, Treating Referring Physician: Ardath Sax RUPASHREE Physician/Extender: Weeks in Treatment: 6 Wound Status Wound Number: 1 Primary Lymphedema Etiology: Wound Location: Left Lower Leg - Lateral Secondary Trauma, Other Wounding Event: Trauma Etiology: Date Acquired: 05/09/2015 Wound Open Weeks Of Treatment: 6 Status: Clustered Wound: No Comorbid Lymphedema, Chronic Obstructive History: Pulmonary Disease (COPD), Sleep Apnea, Congestive Heart Failure, Deep Vein Thrombosis, Hypertension, Seizure Disorder Photos Photo Uploaded By: Lucrezia Starch RN, Rosalio Macadamia on 08/07/2015 15:06:55 Wound Measurements Length: (cm) 0.7 Width: (cm) 1.3 Depth: (cm) 0.2 Area: (cm) 0.715 Kampf, Jahmal L. ([id number]) % Reduction in Area: 97% % Reduction in Volume: 99.6% Epithelialization: Medium (34-66%) Tunneling: No Volume: (cm) 0.143 Undermining: No Wound Description Full Thickness Without Exposed Classification: Support Structures Wound Margin: Flat and Intact Exudate Large Amount: Exudate Type: Serosanguineous Exudate Color: red, brown Foul Odor After Cleansing: No Wound Bed Granulation Amount: Large (67-100%) Exposed Structure Granulation Quality: Red, Hyper-granulation Fascia Exposed: No Necrotic Amount: None Present (0%) Fat Layer Exposed: No Tendon Exposed: No Muscle Exposed: No Joint Exposed: No Bone Exposed:  No Limited to Skin Breakdown Periwound Skin Texture Texture Color No Abnormalities Noted: No No Abnormalities Noted: No Callus: No Atrophie Blanche: No Crepitus: No Cyanosis: No Excoriation: No Ecchymosis: No Fluctuance: No Erythema: No Friable: No Hemosiderin Staining: Yes Induration: No Mottled: No Localized Edema: Yes Pallor: No Rash: No Rubor: No Scarring: No Temperature / Pain Moisture Temperature: No Abnormality No Abnormalities Noted: No Dry / Scaly: No Maceration: No Moist: Yes Wound Preparation Ulcer Cleansing: Other: soap and water, Topical Anesthetic Applied: Other: lidocaine 4%, Treatment Notes Wound #1 (Left, Lateral Lower Leg) 1. Cleansed with: Cleanse wound with antibacterial soap and water Wik, Kevontae L. ([id number]) 3. Peri-wound Care: Moisturizing lotion 4. Dressing Applied: Prisma Ag 5. Secondary Dressing Applied Dry Gauze 7. Secured with Henriette Combs to Left Lower Extremity Electronic Signature(s) Signed: 08/07/2015 3:28:31 PM By: Elpidio Eric BSN, RN Entered By: Elpidio Eric on 08/07/2015 13:02:20 Begay, [name] ([id number]) -------------------------------------------------------------------------------- Vitals Details Patient Name: TYSHUN, TUCKERMAN 08/07/2015 1:45 Date of Service: PM Medical Record [id number] Number: Patient Account Number: [id number] Date of Birth/Sex: [birth year] (74 y.o. Male) Afful, RN, BSN, Treating RN: [place] Sink Primary Care Chales Salmon, Physician: RUPASHREE Other ClinicianChales Salmon, Treating Referring Physician: Ardath Sax RUPASHREE Physician/Extender: Weeks in Treatment: 6 Vital Signs Time Taken: 12:54 Temperature (F): 98.4 Height (in): 70 Pulse (bpm): 73 Weight (lbs): 296 Respiratory Rate (breaths/min): 20 Body Mass Index (BMI): 42.5 Blood Pressure (mmHg): 139/71 Reference Range: 80 - 120 mg / dl Electronic Signature(s) Signed: 08/07/2015 3:28:31 PM By: Elpidio Eric BSN, RN Entered By:  Elpidio Eric on 08/07/2015 12:55:59

## 2015-08-12 ENCOUNTER — Encounter: Payer: Medicare Other | Attending: Surgery | Admitting: Surgery

## 2015-08-12 DIAGNOSIS — L97222 Non-pressure chronic ulcer of left calf with fat layer exposed: Secondary | ICD-10-CM | POA: Diagnosis present

## 2015-08-12 DIAGNOSIS — S81811A Laceration without foreign body, right lower leg, initial encounter: Secondary | ICD-10-CM | POA: Diagnosis not present

## 2015-08-12 DIAGNOSIS — J449 Chronic obstructive pulmonary disease, unspecified: Secondary | ICD-10-CM | POA: Insufficient documentation

## 2015-08-12 DIAGNOSIS — I509 Heart failure, unspecified: Secondary | ICD-10-CM | POA: Diagnosis not present

## 2015-08-12 DIAGNOSIS — I890 Lymphedema, not elsewhere classified: Secondary | ICD-10-CM | POA: Insufficient documentation

## 2015-08-12 DIAGNOSIS — K219 Gastro-esophageal reflux disease without esophagitis: Secondary | ICD-10-CM | POA: Insufficient documentation

## 2015-08-12 DIAGNOSIS — Z6841 Body Mass Index (BMI) 40.0 and over, adult: Secondary | ICD-10-CM | POA: Insufficient documentation

## 2015-08-12 NOTE — Progress Notes (Addendum)
JAYSTON, TREVINO ([id number]) Visit Report for 08/12/2015 Chief Complaint Document Details Patient Name: [name], [name] 08/12/2015 2:45 Date of Service: PM Medical Record [id number] Number: Patient Account Number: [id number] Date of Birth/Sex: 28-Jan-1941 (75 y.o. Male) Treating RN: Primary Care Chales Salmon, Other Clinician: Physician: RUPASHREE Treating Dontarious Schaum, Verl Blalock, Physician/Extender: Referring Physician: Armida Sans in Treatment: 7 Information Obtained from: Patient Chief Complaint Patient presents to the wound care center for a consult due non healing wound. nonhealing wound of the left lower extremity which has been present for about a month and a half. Electronic Signature(s) Signed: 08/12/2015 3:24:31 PM By: Evlyn Kanner MD, FACS Entered By: Evlyn Kanner on 08/12/2015 15:24:30 Elison, [name] ([id number]) -------------------------------------------------------------------------------- HPI Details Patient Name: [name], [name] 08/12/2015 2:45 Date of Service: PM Medical Record [id number] Number: Patient Account Number: [id number] Date of Birth/Sex: [birth year] (75 y.o. Male) Treating RN: Primary Care Chales Salmon, Other Clinician: Physician: RUPASHREE Treating Celene Pippins, Verl Blalock, Physician/Extender: Referring Physician: Armida Sans in Treatment: 7 History of Present Illness Location: left lower extremity wound Quality: Patient reports experiencing a dull pain to affected area(s). Severity: Patient states wound are getting worse. Duration: Patient has had the wound for < 6 weeks prior to presenting for treatment Timing: Pain in wound is Intermittent (comes and goes Context: The wound occurred when the patient had a fall and cut his leg which required several sutures. He is also been on several antibiotics including ciprofloxacin and clindamycin. Modifying Factors: Consults to this date include:he has had bilateral lower limb lymphedema  and has been using lymphedema pumps. Associated Signs and Symptoms: Patient reports having difficulty standing for long periods. HPI Description: 75 year old gentleman who has a history of seizures and schizophrenia with bipolar disorder enhances in a assisted living facility. He also has CHF, COPD, GERD and remote history of DVT. He has been treated by the vascular group at [facility] for bilateral lymphedema and has been using lymphedema pumps. This problem started on September 30 when he had a lacerated wound after a fall. The staples were left in for about 10 days and when the staples were removed the wound opened out. Since then he's had several issues with wound infection and the most recent culture showed a heavy growth of Pseudomonas aeruginosa and Staphylococcus aureus and he has received clindamycin and the most recent past and from today he is on levofloxacin. Addendum:Notes were reviewed from a previous vascular consult at [facility] by Dr. Gilda Crease who saw him on 05/27/2014. He had recommended graduated compression stockings class I and also added lymph pumps to improve the control of the patient's lymphedema. 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 Diffuse soft-tissue swelling correlate with bone scan recommended. 07/15/2015 -- he is still awaiting insurance clearance for his MRI and other than that he has no other changes. 07/21/2015 -- he now has the insurance clearance and his MRI has been scheduled for December 28th. 08/11/2014 -- the MRI of his left lower extremity showed there was no bone destruction or periosteal reaction and there was a soft tissue wound along the lateral aspect of the distal left lower leg with skin thickening but there was no drainable fluid collection to suggest an abscess. Electronic Signature(s) Signed: 08/12/2015 3:25:22 PM By:  Evlyn Kanner MD, FACS Tierney, [name] ([id number]) Entered By: Evlyn Kanner on 08/12/2015 15:25:22 EGBERT, SEIDEL ([id number]) -------------------------------------------------------------------------------- Physical Exam Details Patient Name: [name]. 08/12/2015  2:45 Date of Service: PM Medical Record [id number] Number: Patient Account Number: [id number] Date of Birth/Sex: 29-May-1941 46(74 y.o. Male) Treating RN: Primary Care Chales SalmonVARADARAJAN, Other Clinician: Physician: RUPASHREE Treating Lashaye Fisk, Verl BlalockErrol VARADARAJAN, Physician/Extender: Referring Physician: Armida SansUPASHREE Weeks in Treatment: 7 Constitutional . Pulse regular. Respirations normal and unlabored. Afebrile. . Eyes Nonicteric. Reactive to light. Ears, Nose, Mouth, and Throat Lips, teeth, and gums WNL.Marland Kitchen. Moist mucosa without lesions. Neck supple and nontender. No palpable supraclavicular or cervical adenopathy. Normal sized without goiter. Respiratory WNL. No retractions.. Cardiovascular Pedal Pulses WNL. No clubbing, cyanosis or edema. Lymphatic No adneopathy. No adenopathy. No adenopathy. Musculoskeletal Adexa without tenderness or enlargement.. Digits and nails w/o clubbing, cyanosis, infection, petechiae, ischemia, or inflammatory conditions.. Integumentary (Hair, Skin) No suspicious lesions. No crepitus or fluctuance. No peri-wound warmth or erythema. No masses.Marland Kitchen. Psychiatric Judgement and insight Intact.. No evidence of depression, anxiety, or agitation.. Notes the wound is looking excellent and is almost completely closed except for a few areas where they're draining fluid and there are micro-ulcerations. His edema is also better on the left lower extremity. Electronic Signature(s) Signed: 08/12/2015 3:26:06 PM By: Evlyn KannerBritto, Jermayne Sweeney MD, FACS Entered By: Evlyn KannerBritto, Pope Brunty on 08/12/2015 15:26:05 [name] DarbyCRAWFORD, Terran L.  ([id number]) -------------------------------------------------------------------------------- Physician Orders Details Patient Name: [name] DarbyCRAWFORD, Aaden L. 08/12/2015 2:45 Date of Service: PM Medical Record [id number] Number: Patient Account Number: [id number] Date of Birth/Sex: 29-May-1941 (74 y.o. Male) Treating RN: Huel CoventryWoody, Kim [facility] Other Clinician: Physician: RUPASHREE Treating Cally Nygard, Verl BlalockErrol VARADARAJAN, Physician/Extender: Referring Physician: Armida SansUPASHREE Weeks in Treatment: 7 Verbal / Phone Orders: Yes Clinician: Huel CoventryWoody, Kim Read Back and Verified: Yes Diagnosis Coding Wound Cleansing Wound #1 Left,Lateral Lower Leg o Clean wound with Normal Saline. Primary Wound Dressing Wound #1 Left,Lateral Lower Leg o Mepitel One Secondary Dressing Wound #1 Left,Lateral Lower Leg o Dry Gauze Dressing Change Frequency Wound #1 Left,Lateral Lower Leg o Change dressing every week o Other: - Home health to change wrap if it slides down more than 1 inch; caregiver with call HHRN if necessary. Follow-up Appointments Wound #1 Left,Lateral Lower Leg o Return Appointment in 1 week. Edema Control Wound #1 Left,Lateral Lower Leg o Unna Boot to Left Lower Extremity - Pink paste o Elevate legs to the level of the heart and pump ankles as often as possible o Compression Pump: Use compression pump on left lower extremity for 30 minutes, twice daily. Additional Orders / Instructions Wound #1 Left,Lateral Lower Leg o Increase protein intake. [name] DarbyCRAWFORD, Dekendrick L. ([id number]) o Activity as tolerated Home Health Wound #1 Left,Lateral Lower Leg o Continue Home Health Visits - HHRN to be notified to change wrap if it slides more than one inch. o Home Health Nurse may visit PRN to address patientos wound care needs. o FACE TO FACE ENCOUNTER: MEDICARE and MEDICAID PATIENTS: I certify that this patient is under my care and that I had a face-to-face encounter  that meets the physician face-to-face encounter requirements with this patient on this date. The encounter with the patient was in whole or in part for the following MEDICAL CONDITION: (primary reason for Home Healthcare) MEDICAL NECESSITY: I certify, that based on my findings, NURSING services are a medically necessary home health service. HOME BOUND STATUS: I certify that my clinical findings support that this patient is homebound (i.e., Due to illness or injury, pt requires aid of supportive devices such as crutches, cane, wheelchairs, walkers, the use of special transportation or the assistance of another person to leave their place of residence. There is a normal inability  to leave the home and doing so requires considerable and taxing effort. Other absences are for medical reasons / religious services and are infrequent or of short duration when for other reasons). o If current dressing causes regression in wound condition, may D/C ordered dressing product/s and apply Normal Saline Moist Dressing daily until next Wound Healing Center / Other MD appointment. Notify Wound Healing Center of regression in wound condition at [phone]. o Please direct any NON-WOUND related issues/requests for orders to patient's Primary Care Physician Electronic Signature(s) Signed: 08/12/2015 4:44:22 PM By: Evlyn Kanner MD, FACS Signed: 08/12/2015 5:35:09 PM By: Elliot Gurney RN, BSN, Kim RN, BSN Entered By: Elliot Gurney, RN, BSN, Kim on 08/12/2015 15:24:33 Carattini, [name] ([id number]) -------------------------------------------------------------------------------- Problem List Details Patient Name: HIEP, OLLIS 08/12/2015 2:45 Date of Service: PM Medical Record [id number] Number: Patient Account Number: [id number] Date of Birth/Sex: [birth year] (75 y.o. 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 S81.811A Laceration without foreign body, right lower leg, initial 06/24/2015 Yes encounter E66.01 Morbid (severe) obesity due to excess calories 06/24/2015 Yes I50.9 Heart failure, unspecified 06/24/2015 Yes Inactive Problems Resolved Problems Electronic Signature(s) Signed: 08/12/2015 3:24:21 PM By: Evlyn Kanner MD, FACS Entered By: Evlyn Kanner on 08/12/2015 15:24:21 Grabski, Dekota Elbert Ewings ([id number]) -------------------------------------------------------------------------------- Progress Note Details Patient Name: [name], [name] 08/12/2015 2:45 Date of Service: PM Medical Record [id number] Number: Patient Account Number: [id number] Date of Birth/Sex: 09-19-1940 (75 y.o. Male) Treating RN: Primary Care Chales Salmon, Other Clinician: Physician: RUPASHREE Treating Jung Yurchak, Verl Blalock, Physician/Extender: Referring Physician: Armida Sans in Treatment: 7 Subjective Chief Complaint Information obtained from Patient Patient presents to the wound care center for a consult due non healing wound. nonhealing wound of the left lower extremity which has been present for about a month and a half. History of Present Illness (HPI) The following HPI elements were documented for the patient's wound: Location: left lower extremity wound Quality: Patient reports experiencing a dull pain to affected area(s). Severity: Patient states wound are getting worse. Duration: Patient has had the wound for < 6 weeks prior to presenting for treatment Timing: Pain in wound is Intermittent (comes and goes Context: The wound occurred when the patient had a fall and cut his leg which required several sutures. He is also been on several  antibiotics including ciprofloxacin and clindamycin. Modifying Factors: Consults to this date include:he has had bilateral lower limb lymphedema and has been using lymphedema pumps. Associated Signs and Symptoms: Patient reports having difficulty standing for long periods. 75 year old gentleman who has a history of seizures and schizophrenia with bipolar disorder enhances in a assisted living facility. He also has CHF, COPD, GERD and remote history of DVT. He has been treated by the vascular group at [facility] for bilateral lymphedema and has been using lymphedema pumps. This problem started on September 30 when he had a lacerated wound after a fall. The staples were left in for about 10 days and when the staples were removed the wound opened out. Since then he's had several issues with wound infection and the most recent culture showed a heavy growth of Pseudomonas aeruginosa and Staphylococcus aureus and he has received clindamycin and the most recent past and from today he is on levofloxacin. Addendum:Notes were reviewed from a previous  vascular consult at [facility] by Dr. Gilda Crease who saw him on 05/27/2014. He had recommended graduated compression stockings class I and also added lymph pumps to improve the control of the patient's lymphedema. 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 Diffuse soft-tissue swelling correlate with bone scan recommended. 07/15/2015 -- he is still awaiting insurance clearance for his MRI and other than that he has no other changes. KERRION, KEMPPAINEN ([id number]) 07/21/2015 -- he now has the insurance clearance and his MRI has been scheduled for December 28th. 08/11/2014 -- the MRI of his left lower extremity showed there was no bone destruction or periosteal reaction and there was a soft tissue wound along the lateral aspect of the distal left  lower leg with skin thickening but there was no drainable fluid collection to suggest an abscess. Objective Constitutional Pulse regular. Respirations normal and unlabored. Afebrile. Vitals Time Taken: 3:05 PM, Height: 70 in, Weight: 296 lbs, BMI: 42.5, Temperature: 98.0 F, Pulse: 86 bpm, Respiratory Rate: 18 breaths/min, Blood Pressure: 155/77 mmHg. Eyes Nonicteric. Reactive to light. Ears, Nose, Mouth, and Throat Lips, teeth, and gums WNL.Marland Kitchen Moist mucosa without lesions. Neck supple and nontender. No palpable supraclavicular or cervical adenopathy. Normal sized without goiter. Respiratory WNL. No retractions.. Cardiovascular Pedal Pulses WNL. No clubbing, cyanosis or edema. Lymphatic No adneopathy. No adenopathy. No adenopathy. Musculoskeletal Adexa without tenderness or enlargement.. Digits and nails w/o clubbing, cyanosis, infection, petechiae, ischemia, or inflammatory conditions.Marland Kitchen Psychiatric Judgement and insight Intact.. No evidence of depression, anxiety, or agitation.. General Notes: the wound is looking excellent and is almost completely closed except for a few areas where they're draining fluid and there are micro-ulcerations. His edema is also better on the left lower extremity. Integumentary (Hair, Skin) Chrostowski, Waylan L. ([id number]) No suspicious lesions. No crepitus or fluctuance. No peri-wound warmth or erythema. No masses.. Wound #1 status is Open. Original cause of wound was Trauma. The wound is located on the Left,Lateral Lower Leg. The wound measures 0.1cm length x 0.1cm width x 0.1cm depth; 0.008cm^2 area and 0.001cm^3 volume. The wound is limited to skin breakdown. There is no tunneling or undermining noted. There is a none present amount of drainage noted. The wound margin is flat and intact. There is large (67- 100%) red granulation within the wound bed. There is no necrotic tissue within the wound bed. The periwound skin appearance exhibited: Dry/Scaly,  Hemosiderin Staining. The periwound skin appearance did not exhibit: Callus, Crepitus, Excoriation, Fluctuance, Friable, Induration, Localized Edema, Rash, Scarring, Maceration, Moist, Atrophie Blanche, Cyanosis, Ecchymosis, Mottled, Pallor, Rubor, Erythema. Periwound temperature was noted as No Abnormality. Assessment Active Problems ICD-10 I89.0 - Lymphedema, not elsewhere classified L97.222 - Non-pressure chronic ulcer of left calf with fat layer exposed S81.811A - Laceration without foreign body, right lower leg, initial encounter E66.01 - Morbid (severe) obesity due to excess calories I50.9 - Heart failure, unspecified Having confirmed that his MRI was normal we will continue with a nonadherent dressing and he does not need Prisma any longer. We will use Unna's boots this week and I understand he is not able to get any compression stockings through Medicaid. He does have lymphedema pumps though and he will continue using them twice a day. Plan Wound Cleansing: Wound #1 Left,Lateral Lower Leg: Clean wound with Normal Saline. Primary Wound Dressing: Wound #1 Left,Lateral Lower Leg: Mepitel One Secondary Dressing: Wound #1 Left,Lateral Lower Leg: Dry Gauze Dressing Change Frequency: Kho,  Rhylan L. ([id number]) Wound #1 Left,Lateral Lower Leg: Change dressing every week Other: - Home health to change wrap if it slides down more than 1 inch; caregiver with call HHRN if necessary. Follow-up Appointments: Wound #1 Left,Lateral Lower Leg: Return Appointment in 1 week. Edema Control: Wound #1 Left,Lateral Lower Leg: Unna Boot to Left Lower Extremity - Pink paste Elevate legs to the level of the heart and pump ankles as often as possible Compression Pump: Use compression pump on left lower extremity for 30 minutes, twice daily. Additional Orders / Instructions: Wound #1 Left,Lateral Lower Leg: Increase protein intake. Activity as tolerated Home Health: Wound #1 Left,Lateral  Lower Leg: Continue Home Health Visits - HHRN to be notified to change wrap if it slides more than one inch. Home Health Nurse may visit PRN to address patient s wound care needs. FACE TO FACE ENCOUNTER: MEDICARE and MEDICAID PATIENTS: I certify that this patient is under my care and that I had a face-to-face encounter that meets the physician face-to-face encounter requirements with this patient on this date. The encounter with the patient was in whole or in part for the following MEDICAL CONDITION: (primary reason for Home Healthcare) MEDICAL NECESSITY: I certify, that based on my findings, NURSING services are a medically necessary home health service. HOME BOUND STATUS: I certify that my clinical findings support that this patient is homebound (i.e., Due to illness or injury, pt requires aid of supportive devices such as crutches, cane, wheelchairs, walkers, the use of special transportation or the assistance of another person to leave their place of residence. There is a normal inability to leave the home and doing so requires considerable and taxing effort. Other absences are for medical reasons / religious services and are infrequent or of short duration when for other reasons). If current dressing causes regression in wound condition, may D/C ordered dressing product/s and apply Normal Saline Moist Dressing daily until next Wound Healing Center / Other MD appointment. Notify Wound Healing Center of regression in wound condition at [phone]. Please direct any NON-WOUND related issues/requests for orders to patient's Primary Care Physician Having confirmed that his MRI was normal we will continue with a nonadherent dressing and he does not need Prisma any longer. We will use Unna's boots this week and I understand he is not able to get any compression stockings through Medicaid. He does have lymphedema pumps though and he will continue using them twice a day. Electronic  Signature(s) Signed: 08/12/2015 3:30:52 PM By: Evlyn Kanner MD, FACS Buxbaum, [name] ([id number]) Entered By: Evlyn Kanner on 08/12/2015 15:30:52 Forness, [name] ([id number]) -------------------------------------------------------------------------------- SuperBill Details Patient Name: [name] Date of Service: 08/12/2015 Medical Record Number: [id number] Patient Account Number: [id number] Date of Birth/Sex: [birth year] (75 y.o. Male) Treating RN: Primary Care Physician: Lorenda Ishihara Other Clinician: Referring Physician: Lorenda Ishihara Treating Physician/Extender: Rudene Re in Treatment: 7 Diagnosis Coding ICD-10 Codes Code Description I89.0 Lymphedema, not elsewhere classified L97.222 Non-pressure chronic ulcer of left calf with fat layer exposed S81.811A Laceration without foreign body, right lower leg, initial encounter E66.01 Morbid (severe) obesity due to excess calories I50.9 Heart failure, unspecified Physician Procedures CPT4 Code Description: [id number] - WC PHYS LEVEL 3 - EST PT ICD-10 Description Diagnosis I89.0 Lymphedema, not elsewhere classified L97.222 Non-pressure chronic ulcer of left calf with fat layer S81.811A Laceration without foreign body, right lower  leg, init E66.01 Morbid (severe) obesity due to excess calories Modifier: exposed ial encounte Quantity: 1 r Electronic Signature(s) Signed: 08/12/2015  3:31:21 PM By: Evlyn Kanner MD, FACS Entered By: Evlyn Kanner on 08/12/2015 15:31:21

## 2015-08-13 NOTE — Progress Notes (Signed)
AHMIR, BRACKEN ([id number]) Visit Report for 08/12/2015 Arrival Information Details Patient Name: [name] Date of Service: 08/12/2015 2:45 PM Medical Record Patient Account Number: [id number] Number: Lucrezia Starch, Treating RN: Date of Birth/Sex: 11/25/1940 (75 y.o. Male) Baird Kay, Other Clinician: Primary Care Physician: RUPASHREE Treating Britto, Verl Blalock, Physician/Extender: Referring Physician: Armida Sans in Treatment: 7 Visit Information History Since Last Visit All ordered tests and consults were completed: No Patient Arrived: [name] Added or deleted any medications: No Arrival Time: 15:03 Any new allergies or adverse reactions: No Accompanied By: caregiver Had a fall or experienced change in No Transfer Assistance: None activities of daily living that may affect Patient Identification Verified: Yes risk of falls: Secondary Verification Process Yes Signs or symptoms of abuse/neglect since last No Completed: visito Patient Requires Transmission- Yes Hospitalized since last visit: No Based Precautions: Has Compression in Place as Prescribed: Yes Transmission-Based Contact MRSA Pain Present Now: No Precautions: Patient Has Alerts: Yes Patient Alerts: NOT Diabetic [facility] schizophrenia and bipolar Electronic Signature(s) Signed: 08/12/2015 4:12:15 PM By: Lucrezia Starch, RN, Sendra Entered By: Lucrezia Starch, RN, Sendra on 08/12/2015 15:04:56 [name] ([id number]) -------------------------------------------------------------------------------- Encounter Discharge Information Details Patient Name: [name] 08/12/2015 2:45 Date of Service: PM Medical Record [id number] Number: Patient Account Number: [id number] Date of Birth/Sex: [birth year] (75 y.o. Male) Treating RN: Chales Salmon, Other Clinician: Primary Care Physician: RUPASHREE Treating Britto, Verl Blalock, Physician/Extender: Referring  Physician: Armida Sans in Treatment: 7 Encounter Discharge Information Items Discharge Pain Level: 0 Discharge Condition: Stable Ambulatory Status: Walker Nursing Discharge Destination: Home Transportation: Private Auto Accompanied By: caregiver Schedule Follow-up Appointment: Yes Medication Reconciliation completed Yes and provided to Patient/Care Danyah Guastella: Provided on Clinical Summary of Care: 08/12/2015 Form Type Recipient Paper Patient JC Electronic Signature(s) Signed: 08/12/2015 4:12:15 PM By: Lucrezia Starch RN, Sendra Previous Signature: 08/12/2015 3:30:38 PM Version By: Francie Massing Entered By: Lucrezia Starch RN, Sendra on 08/12/2015 15:33:52 [name] ([id number]) -------------------------------------------------------------------------------- Lower Extremity Assessment Details Patient Name: [name] Date of Service: 08/12/2015 2:45 PM Medical Record Patient Account Number: [id number] Number: Lucrezia Starch, Treating RN: Date of Birth/Sex: [birth year] (75 y.o. Male) Baird Kay, Other Clinician: Primary Care Physician: RUPASHREE Treating Britto, Verl Blalock, Physician/Extender: Referring Physician: Armida Sans in Treatment: 7 Edema Assessment Assessed: Kyra Searles: No] [Right: No] Edema: [Left: Ye] [Right: s] Calf Left: Right: Point of Measurement: 33 cm From Medial Instep 47 cm cm Ankle Left: Right: Point of Measurement: 11 cm From Medial Instep 33 cm cm Vascular Assessment Pulses: Posterior Tibial Dorsalis Pedis Palpable: [Left:Yes] Extremity colors, hair growth, and conditions: Extremity Color: [Left:Mottled] Hair Growth on Extremity: [Left:No] Capillary Refill: [Left:< 3 seconds] Dependent Rubor: [Left:No] Blanched when Elevated: [Left:No] Lipodermatosclerosis: [Left:No] Toe Nail Assessment Left: Right: Thick: Yes Discolored: No Deformed: No Improper Length and Hygiene: Yes Electronic Signature(s) NICKALOS, PETERSEN  ([id number]) Signed: 08/12/2015 4:12:15 PM By: Lucrezia Starch, RN, Sendra Entered By: Lucrezia Starch RN, Sendra on 08/12/2015 15:11:32 Capozzoli, Doy [name] ([id number]) -------------------------------------------------------------------------------- Multi Wound Chart Details Patient Name: [name] 08/12/2015 2:45 Date of Service: PM Medical Record [id number] Number: Patient Account Number: [id number] Date of Birth/Sex: [birth year] (75 y.o. Male) Treating RN: Eilene Ghazi, Other Clinician: Primary Care Physician: RUPASHREE Treating Britto, Verl Blalock, Physician/Extender: Referring Physician: Armida Sans in Treatment: 7 Vital Signs Height(in): 70 Pulse(bpm): 86 Weight(lbs): 296 Blood Pressure 155/77 (mmHg): Body Mass Index(BMI): 42 Temperature(F): 98.0 Respiratory Rate 18 (breaths/min): Photos: [1:No Photos] [N/A:N/A] Wound Location: [1:Left Lower Leg - Lateral] [N/A:N/A] Wounding Event: [1:Trauma] [N/A:N/A] Primary Etiology: [  1:Lymphedema] [N/A:N/A] Secondary Etiology: [1:Trauma, Other] [N/A:N/A] Comorbid History: [1:Lymphedema, Chronic Obstructive Pulmonary Disease (COPD), Sleep Apnea, Congestive Heart Failure, Deep Vein Thrombosis, Hypertension, Seizure Disorder] [N/A:N/A] Date Acquired: [1:05/09/2015] [N/A:N/A] Weeks of Treatment: [1:7] [N/A:N/A] Wound Status: [1:Open] [N/A:N/A] Measurements L x W x D 0.1x0.1x0.1 [N/A:N/A] (cm) Area (cm) : [1:0.008] [N/A:N/A] Volume (cm) : [1:0.001] [N/A:N/A] % Reduction in Area: [1:100.00%] [N/A:N/A] % Reduction in Volume: 100.00% [N/A:N/A] Classification: [1:Full Thickness Without Exposed Support Structures] [N/A:N/A] Exudate Amount: [1:None Present] [N/A:N/A] Wound Margin: [1:Flat and Intact] [N/A:N/A] Granulation Amount: Large (67-100%) N/A N/A Granulation Quality: Red, Hyper-granulation N/A N/A Necrotic Amount: None Present (0%) N/A N/A Exposed Structures: Fascia: No N/A N/A Fat: No Tendon: No Muscle:  No Joint: No Bone: No Limited to Skin Breakdown Epithelialization: Medium (34-66%) N/A N/A Periwound Skin Texture: Edema: No N/A N/A Excoriation: No Induration: No Callus: No Crepitus: No Fluctuance: No Friable: No Rash: No Scarring: No Periwound Skin Dry/Scaly: Yes N/A N/A Moisture: Maceration: No Moist: No Periwound Skin Color: Hemosiderin Staining: Yes N/A N/A Atrophie Blanche: No Cyanosis: No Ecchymosis: No Erythema: No Mottled: No Pallor: No Rubor: No Temperature: No Abnormality N/A N/A Tenderness on No N/A N/A Palpation: Wound Preparation: Ulcer Cleansing: Other: N/A N/A soap and water Topical Anesthetic Applied: None Treatment Notes Electronic Signature(s) Signed: 08/12/2015 5:35:09 PM By: Elliot GurneyWoody, RN, BSN, Kim RN, BSN Entered By: Elliot GurneyWoody, RN, BSN, Kim on 08/12/2015 15:23:01 Borquez, Doy HutchingJERRY L. ([id number]) -------------------------------------------------------------------------------- Multi-Disciplinary Care Plan Details Patient Name: [name] DarbyCRAWFORD, [name] 08/12/2015 2:45 Date of Service: PM Medical Record [id number] Number: Patient Account Number: [id number] Date of Birth/Sex: 06-19-1941 [age] y.o. Male) Treating RN: Eilene GhaziWoody, Kim VARADARAJAN, Other Clinician: Primary Care Physician: RUPASHREE Treating Britto, Verl BlalockErrol VARADARAJAN, Physician/Extender: Referring Physician: Armida SansUPASHREE Weeks in Treatment: 7 Active Inactive Abuse / Safety / Falls / Self Care Management Nursing Diagnoses: Impaired physical mobility Potential for falls Goals: Patient will remain injury free Date Initiated: 06/24/2015 Goal Status: Active Interventions: Assess fall risk on admission and as needed Notes: Necrotic Tissue Nursing Diagnoses: Impaired tissue integrity related to necrotic/devitalized tissue Goals: Necrotic/devitalized tissue will be minimized in the wound bed Date Initiated: 06/24/2015 Goal Status: Active Interventions: Assess patient pain level pre-, during and post  procedure and prior to discharge Treatment Activities: Apply topical anesthetic as ordered : 08/12/2015 Notes: Orientation to the Wound Care Program [name] DarbyCRAWFORD, [name] ([id number]) Nursing Diagnoses: Knowledge deficit related to the wound healing center program Goals: Patient/caregiver will verbalize understanding of the Wound Healing Center Program Date Initiated: 06/24/2015 Goal Status: Active Interventions: Provide education on orientation to the wound center Notes: Wound/Skin Impairment Nursing Diagnoses: Knowledge deficit related to ulceration/compromised skin integrity Goals: Ulcer/skin breakdown will heal within 14 weeks Date Initiated: 06/24/2015 Goal Status: Active Interventions: Provide education on ulcer and skin care Treatment Activities: Patient referred to home care : 08/12/2015 Skin care regimen initiated : 08/12/2015 Notes: Electronic Signature(s) Signed: 08/12/2015 5:35:09 PM By: Elliot GurneyWoody, RN, BSN, Kim RN, BSN Entered By: Elliot GurneyWoody, RN, BSN, Kim on 08/12/2015 15:22:44 Camba, Doy HutchingJERRY L. ([id number]) -------------------------------------------------------------------------------- Pain Assessment Details Patient Name: [name] DarbyRAWFORD, [name] Date of Service: 08/12/2015 2:45 PM Medical Record Patient Account Number: [id number] Number: Lucrezia [place], Treating RN: Date of Birth/Sex: 06-19-1941 [age] y.o. Male) Baird KaySendra VARADARAJAN, Other Clinician: Primary Care Physician: RUPASHREE Treating Britto, Verl BlalockErrol VARADARAJAN, Physician/Extender: Referring Physician: Armida SansUPASHREE Weeks in Treatment: 7 Active Problems Location of Pain Severity and Description of Pain Patient Has Paino Yes Site Locations Pain Location: Generalized Pain Rate the pain. Current Pain Level: 4 Pain Management and Medication Current  Pain Management: Medication: Yes Electronic Signature(s) Signed: 08/12/2015 4:12:15 PM By: Lucrezia Starch, RN, Sendra Entered By: Lucrezia Starch, RN, Sendra on 08/12/2015  15:05:50 [name] ([id number]) -------------------------------------------------------------------------------- Patient/Caregiver Education Details Patient Name: [name] Date of Service: 08/12/2015 2:45 PM Medical Record Patient Account Number: [id number] Number: Lucrezia Starch, Treating RN: Date of Birth/Gender: 1941/01/18 (75 y.o. Male) Baird Kay, Other Clinician: Primary Care Physician: RUPASHREE Treating Britto, Verl Blalock, Physician/Extender: Referring Physician: Armida Sans in Treatment: 7 Education Assessment Education Provided To: Patient Education Topics Provided Venous: Handouts: Other: unna boot Methods: Demonstration Responses: State content correctly Electronic Signature(s) Signed: 08/12/2015 4:12:15 PM By: Lucrezia Starch, RN, Sendra Entered By: Lucrezia Starch, RN, Sendra on 08/12/2015 15:33:20 Novakovich, Doy [name] ([id number]) -------------------------------------------------------------------------------- Wound Assessment Details Patient Name: [name] Date of Service: 08/12/2015 2:45 PM Medical Record Patient Account Number: [id number] Number: Lucrezia Starch, Treating RN: Date of Birth/Sex: Dec 22, 1940 (75 y.o. Male) Baird Kay, Other Clinician: Primary Care Physician: RUPASHREE Treating Britto, Verl Blalock, Physician/Extender: Referring Physician: Armida Sans in Treatment: 7 Wound Status Wound Number: 1 Primary Lymphedema Etiology: Wound Location: Left Lower Leg - Lateral Secondary Trauma, Other Wounding Event: Trauma Etiology: Date Acquired: 05/09/2015 Wound Open Weeks Of Treatment: 7 Status: Clustered Wound: No Comorbid Lymphedema, Chronic Obstructive History: Pulmonary Disease (COPD), Sleep Apnea, Congestive Heart Failure, Deep Vein Thrombosis, Hypertension, Seizure Disorder Photos Photo Uploaded By: Elliot Gurney, RN, BSN, Kim on 08/12/2015 16:43:30 Wound Measurements Length: (cm)  0.1 Width: (cm) 0.1 Depth: (cm) 0.1 Area: (cm) 0.008 Volume: (cm) 0.001 % Reduction in Area: 100% % Reduction in Volume: 100% Epithelialization: Medium (34-66%) Tunneling: No Undermining: No Wound Description Full Thickness Without Exposed Foul Odor Afte Classification: Support Structures Wound Margin: Flat and Intact None Present Marrufo, Bricen L. ([id number]) r Cleansing: No Exudate Amount: Wound Bed Granulation Amount: Large (67-100%) Exposed Structure Granulation Quality: Red, Hyper-granulation Fascia Exposed: No Necrotic Amount: None Present (0%) Fat Layer Exposed: No Tendon Exposed: No Muscle Exposed: No Joint Exposed: No Bone Exposed: No Limited to Skin Breakdown Periwound Skin Texture Texture Color No Abnormalities Noted: No No Abnormalities Noted: No Callus: No Atrophie Blanche: No Crepitus: No Cyanosis: No Excoriation: No Ecchymosis: No Fluctuance: No Erythema: No Friable: No Hemosiderin Staining: Yes Induration: No Mottled: No Localized Edema: No Pallor: No Rash: No Rubor: No Scarring: No Temperature / Pain Moisture Temperature: No Abnormality No Abnormalities Noted: No Dry / Scaly: Yes Maceration: No Moist: No Wound Preparation Ulcer Cleansing: Other: soap and water, Topical Anesthetic Applied: None Treatment Notes Wound #1 (Left, Lateral Lower Leg) 1. Cleansed with: Cleanse wound with antibacterial soap and water 4. Dressing Applied: Contact layer 5. Secondary Dressing Applied Dry Gauze 7. Secured with [employer] to Left Lower Extremity Electronic Signature(s) HYMEN, ARNETT ([id number]) Signed: 08/12/2015 4:12:15 PM By: Lucrezia Starch RN, Sendra Entered By: Lucrezia Starch RN, Sendra on 08/12/2015 15:12:27 [name], [name] ([id number]) -------------------------------------------------------------------------------- Vitals Details Patient Name: [name] Date of Service: 08/12/2015 2:45 PM Medical Record Patient Account  Number: [id number] Number: Lucrezia Starch, Treating RN: Date of Birth/Sex: 11-29-1940 (75 y.o. Male) Baird Kay, Other Clinician: Primary Care Physician: RUPASHREE Treating Britto, Verl Blalock, Physician/Extender: Referring Physician: Armida Sans in Treatment: 7 Vital Signs Time Taken: 15:05 Temperature (F): 98.0 Height (in): 70 Pulse (bpm): 86 Weight (lbs): 296 Respiratory Rate (breaths/min): 18 Body Mass Index (BMI): 42.5 Blood Pressure (mmHg): 155/77 Reference Range: 80 - 120 mg / dl Electronic Signature(s) Signed: 08/12/2015 4:12:15 PM By: Lucrezia Starch RN, Sendra Entered By: Lucrezia Starch RN, Sendra on 08/12/2015 15:06:21

## 2015-08-14 ENCOUNTER — Ambulatory Visit: Payer: Medicare Other | Admitting: Surgery

## 2015-08-19 ENCOUNTER — Ambulatory Visit: Payer: Medicare Other | Admitting: Surgery

## 2015-08-21 ENCOUNTER — Encounter: Payer: Medicare Other | Admitting: Surgery

## 2015-08-21 DIAGNOSIS — L97222 Non-pressure chronic ulcer of left calf with fat layer exposed: Secondary | ICD-10-CM | POA: Diagnosis not present

## 2015-08-22 NOTE — Progress Notes (Addendum)
[name], [name] ([id number]) Visit Report for 08/21/2015 Arrival Information Details Patient Name: [name], [name] 08/21/2015 2:45 Date of Service: PM Medical Record [id number] Number: Patient Account Number: [id number] Date of Birth/Sex: [birth year] (75 y.o. Male) Treating RN: Ashok Cordia, Debi Primary Care [facility], Other Clinician: Physician: RUPASHREE Treating Britto, Verl Blalock, Physician/Extender: Referring Physician: Armida Sans in Treatment: 8 Visit Information History Since Last Visit All ordered tests and consults were completed: No Patient Arrived: [name] Added or deleted any medications: No Arrival Time: 15:05 Any new allergies or adverse reactions: No Accompanied By: caregiver Had a fall or experienced change in No Transfer Assistance: None activities of daily living that may affect Patient Identification Verified: Yes risk of falls: Secondary Verification Process Yes Signs or symptoms of abuse/neglect since last No Completed: visito Patient Requires Transmission- Yes Hospitalized since last visit: No Based Precautions: Pain Present Now: No Transmission-Based Contact MRSA Precautions: Patient Has Alerts: Yes Patient Alerts: NOT Diabetic [facility] schizophrenia and bipolar Electronic Signature(s) Signed: 08/21/2015 5:10:55 PM By: Alejandro Mulling Entered By: Alejandro Mulling on 08/21/2015 15:05:45 [name] ([id number]) -------------------------------------------------------------------------------- Clinic Level of Care Assessment Details Patient Name: [name] Date of Service: 08/21/2015 2:45 PM Medical Record Patient Account Number: [id number] Number: [name], Treating RN: Date of Birth/Sex: 1940-11-13 (75 y.o. Male) [facility], Other Clinician: Physician: RUPASHREE Treating Britto, Verl Blalock, Physician/Extender: Referring Physician: Armida Sans in  Treatment: 8 Clinic Level of Care Assessment Items TOOL 4 Quantity Score []  - Use when only an EandM is performed on FOLLOW-UP visit 0 ASSESSMENTS - Nursing Assessment / Reassessment X - Reassessment of Co-morbidities (includes updates in patient status) 1 10 X - Reassessment of Adherence to Treatment Plan 1 5 ASSESSMENTS - Wound and Skin Assessment / Reassessment []  - Simple Wound Assessment / Reassessment - one wound 0 []  - Complex Wound Assessment / Reassessment - multiple wounds 0 []  - Dermatologic / Skin Assessment (not related to wound area) 0 ASSESSMENTS - Focused Assessment []  - Circumferential Edema Measurements - multi extremities 0 []  - Nutritional Assessment / Counseling / Intervention 0 X - Lower Extremity Assessment (monofilament, tuning fork, pulses) 1 5 []  - Peripheral Arterial Disease Assessment (using hand held doppler) 0 ASSESSMENTS - Ostomy and/or Continence Assessment and Care []  - Incontinence Assessment and Management 0 []  - Ostomy Care Assessment and Management (repouching, etc.) 0 PROCESS - Coordination of Care X - Simple Patient / Family Education for ongoing care 1 15 []  - Complex (extensive) Patient / Family Education for ongoing care 0 []  - Staff obtains Consents, Records, Test Results / Process Orders 0 [name], [name] ([id number]) []  - Staff telephones HHA, Nursing Homes / Clarify orders / etc 0 []  - Routine Transfer to another Facility (non-emergent condition) 0 []  - Routine Hospital Admission (non-emergent condition) 0 []  - New Admissions / Manufacturing engineer / Ordering NPWT, Apligraf, etc. 0 []  - Emergency Hospital Admission (emergent condition) 0 X - Simple Discharge Coordination 1 10 []  - Complex (extensive) Discharge Coordination 0 PROCESS - Special Needs []  - Pediatric / Minor Patient Management 0 []  - Isolation Patient Management 0 []  - Hearing / Language / Visual special needs 0 []  - Assessment of Community assistance (transportation,  D/C planning, etc.) 0 []  - Additional assistance / Altered mentation 0 []  - Support Surface(s) Assessment (bed, cushion, seat, etc.) 0 INTERVENTIONS - Wound Cleansing / Measurement []  - Simple Wound Cleansing - one wound 0 []  - Complex Wound Cleansing -  multiple wounds 0 X - Wound Imaging (photographs - any number of wounds) 1 5 []  - Wound Tracing (instead of photographs) 0 []  - Simple Wound Measurement - one wound 0 []  - Complex Wound Measurement - multiple wounds 0 INTERVENTIONS - Wound Dressings []  - Small Wound Dressing one or multiple wounds 0 []  - Medium Wound Dressing one or multiple wounds 0 []  - Large Wound Dressing one or multiple wounds 0 []  - Application of Medications - topical 0 []  - Application of Medications - injection 0 [name], [name] ([id number]) INTERVENTIONS - Miscellaneous []  - External ear exam 0 []  - Specimen Collection (cultures, biopsies, blood, body fluids, etc.) 0 []  - Specimen(s) / Culture(s) sent or taken to Lab for analysis 0 []  - Patient Transfer (multiple staff / Michiel Sites Lift / Similar devices) 0 []  - Simple Staple / Suture removal (25 or less) 0 []  - Complex Staple / Suture removal (26 or more) 0 []  - Hypo / Hyperglycemic Management (close monitor of Blood Glucose) 0 []  - Ankle / Brachial Index (ABI) - do not check if billed separately 0 X - Vital Signs 1 5 Has the patient been seen at the hospital within the last three years: Yes Total Score: 55 Level Of Care: New/Established - Level 2 Electronic Signature(s) Signed: 08/21/2015 5:20:37 PM By: Elpidio Eric BSN, RN Entered By: Elpidio Eric on 08/21/2015 16:17:45 [name] ([id number]) -------------------------------------------------------------------------------- Encounter Discharge Information Details Patient Name: [name], [name] 08/21/2015 2:45 Date of Service: PM Medical Record [id number] Number: Patient Account Number: [id number] Date of Birth/Sex: May 20, 1941 (75 y.o.  Male) Treating RN: Ashok Cordia, Debi Primary Care [facility], Other Clinician: Physician: RUPASHREE Treating Britto, Verl Blalock, Physician/Extender: Referring Physician: Armida Sans in Treatment: 8 Encounter Discharge Information Items Discharge Pain Level: 0 Discharge Condition: Stable Ambulatory Status: Walker Discharge Destination: Nursing Home Transportation: Other Accompanied By: caregiver Schedule Follow-up Appointment: No Medication Reconciliation completed and provided to Patient/Care Yes Reginna Sermeno: Provided on Clinical Summary of Care: 08/21/2015 Form Type Recipient Paper Patient JC Electronic Signature(s) Signed: 08/21/2015 3:25:43 PM By: Gwenlyn Perking Entered By: Gwenlyn Perking on 08/21/2015 15:25:43 Elk, Doy [name] ([id number]) -------------------------------------------------------------------------------- Lower Extremity Assessment Details Patient Name: [name], [name] 08/21/2015 2:45 Date of Service: PM Medical Record [id number] Number: Patient Account Number: [id number] Date of Birth/Sex: 1940/10/30 (75 y.o. Male) Treating RN: Ashok Cordia, Debi Primary Care [facility], Other Clinician: Physician: RUPASHREE Treating Britto, Verl Blalock, Physician/Extender: Referring Physician: Armida Sans in Treatment: 8 Edema Assessment Assessed: [Left: No] [Right: No] Edema: [Left: Ye] [Right: s] Calf Left: Right: Point of Measurement: cm From Medial Instep 44.5 cm cm Ankle Left: Right: Point of Measurement: cm From Medial Instep 31.5 cm cm Vascular Assessment Pulses: Posterior Tibial Dorsalis Pedis Palpable: [Left:Yes] Extremity colors, hair growth, and conditions: Extremity Color: [Left:Hyperpigmented] Temperature of Extremity: [Left:Warm] Capillary Refill: [Left:< 3 seconds] Toe Nail Assessment Left: Right: Thick: Yes Discolored: Yes Deformed: Yes Improper Length and Hygiene: Yes Electronic Signature(s) Signed: 08/21/2015  5:10:55 PM By: Alejandro Mulling Entered By: Alejandro Mulling on 08/21/2015 15:14:42 Grosz, Doy [name] ([id number]) Corkins, Doy [name] ([id number]) -------------------------------------------------------------------------------- Multi-Disciplinary Care Plan Details Patient Name: [name] Date of Service: 08/21/2015 2:45 PM Medical Record Patient Account Number: [id number] Number: [name], Treating RN: Date of Birth/Sex: Nov 22, 1940 (75 y.o. Male) [facility], Other Clinician: Physician: RUPASHREE Treating Britto, Verl Blalock, Physician/Extender: Referring Physician: Armida Sans in Treatment: 8 Active Inactive Electronic Signature(s) Signed: 08/22/2015 8:59:51 AM By: Elpidio Eric BSN, RN Entered By: Elpidio Eric on  08/22/2015 08:59:51 Kratzke, Doy HutchingJERRY L. ([id number]) -------------------------------------------------------------------------------- Pain Assessment Details Patient Name: [name] DarbyCRAWFORD, [name] 08/21/2015 2:45 Date of Service: PM Medical Record [id number] Number: Patient Account Number: [id number] Date of Birth/Sex: 1941/05/02 34(74 y.o. Male) Treating RN: Ashok CordiaPinkerton, [facility]bi [facility] Other Clinician: Physician: RUPASHREE Treating Britto, Verl BlalockErrol [facility], Physician/Extender: Referring Physician: Armida SansUPASHREE Weeks in Treatment: 8 Active Problems Location of Pain Severity and Description of Pain Patient Has Paino No Site Locations Pain Management and Medication Current Pain Management: Electronic Signature(s) Signed: 08/21/2015 5:10:55 PM By: Alejandro MullingPinkerton, Debra Entered By: Alejandro MullingPinkerton, Debra on 08/21/2015 15:05:50 Siciliano, Doy HutchingJERRY L. ([id number]) -------------------------------------------------------------------------------- Patient/Caregiver Education Details Patient Name: [name] DarbyCRAWFORD, [name] 08/21/2015 2:45 Date of Service: PM Medical Record [id number] Number: Patient Account Number: [id number] Date of  Birth/Gender: 1941/05/02 82(74 y.o. Male) Treating RN: Ashok CordiaPinkerton, Debi Primary Care [facility], Other Clinician: Physician: RUPASHREE Treating Britto, Verl BlalockErrol [facility], Physician/Extender: Referring Physician: Armida SansUPASHREE Weeks in Treatment: 8 Education Assessment Education Provided To: Patient and Caregiver Education Topics Provided Wound/Skin Impairment: Handouts: Other: wear lymphedema pumps as ordered Methods: Demonstration, Explain/Verbal Responses: State content correctly Electronic Signature(s) Signed: 08/21/2015 5:10:55 PM By: Alejandro MullingPinkerton, Debra Entered By: Alejandro MullingPinkerton, Debra on 08/21/2015 15:22:41 Beste, Doy HutchingJERRY L. ([id number]) -------------------------------------------------------------------------------- Wound Assessment Details Patient Name: [name] DarbyCRAWFORD, [name] 08/21/2015 2:45 Date of Service: PM Medical Record [id number] Number: Patient Account Number: [id number] Date of Birth/Sex: 1941/05/02 36(74 y.o. Male) Treating RN: Ashok CordiaPinkerton, Debi Primary Care [facility], Other Clinician: Physician: RUPASHREE Treating Britto, Verl BlalockErrol [facility], Physician/Extender: Referring Physician: Armida SansUPASHREE Weeks in Treatment: 8 Wound Status Wound Number: 1 Primary Lymphedema Etiology: Wound Location: Left Lower Leg - Lateral Secondary Trauma, Other Wounding Event: Trauma Etiology: Date Acquired: 05/09/2015 Wound Healed - Epithelialized Weeks Of Treatment: 8 Status: Clustered Wound: No Comorbid Lymphedema, Chronic Obstructive History: Pulmonary Disease (COPD), Sleep Apnea, Congestive Heart Failure, Deep Vein Thrombosis, Hypertension, Seizure Disorder Photos Photo Uploaded By: Alejandro MullingPinkerton, Debra on 08/21/2015 16:17:52 Wound Measurements Length: (cm) 0 % Reduction in Width: (cm) 0 % Reduction in Depth: (cm) 0 Epithelializat Area: (cm) 0 Volume: (cm) 0 Area: 100% Volume: 100% ion: Medium (34-66%) Wound Description Full Thickness Without Exposed Foul Odor  Aft Classification: Support Structures Wound Margin: Flat and Intact None Present Nick, [name] ([id number]) er Cleansing: No Exudate Amount: Wound Bed Granulation Amount: Large (67-100%) Exposed Structure Granulation Quality: Red, Hyper-granulation Fascia Exposed: No Necrotic Amount: None Present (0%) Fat Layer Exposed: No Tendon Exposed: No Muscle Exposed: No Joint Exposed: No Bone Exposed: No Limited to Skin Breakdown Periwound Skin Texture Texture Color No Abnormalities Noted: No No Abnormalities Noted: No Callus: No Atrophie Blanche: No Crepitus: No Cyanosis: No Excoriation: No Ecchymosis: No Fluctuance: No Erythema: No Friable: No Hemosiderin Staining: Yes Induration: No Mottled: No Localized Edema: No Pallor: No Rash: No Rubor: No Scarring: No Temperature / Pain Moisture Temperature: No Abnormality No Abnormalities Noted: No Dry / Scaly: Yes Maceration: No Moist: No Wound Preparation Ulcer Cleansing: Other: soap and water, Topical Anesthetic Applied: None Electronic Signature(s) Signed: 08/21/2015 5:10:55 PM By: Alejandro MullingPinkerton, Debra Signed: 08/21/2015 5:20:37 PM By: Elpidio EricAfful, Rita BSN, RN Entered By: Elpidio EricAfful, Rita on 08/21/2015 15:20:45 Villavicencio, Doy HutchingJERRY L. ([id number]) -------------------------------------------------------------------------------- Vitals Details Patient Name: [name] DarbyCRAWFORD, [name] 08/21/2015 2:45 Date of Service: PM Medical Record [id number] Number: Patient Account Number: [id number] Date of Birth/Sex: 1941/05/02 (74 y.o. Male) Treating RN: Phillis HaggisPinkerton, [facility]bi [facility] Other Clinician: Physician: RUPASHREE Treating Britto, Verl BlalockErrol [facility]AJAN, Physician/Extender: Referring Physician: Armida SansUPASHREE Weeks in Treatment: 8 Vital Signs Time Taken: 15:06 Temperature (F): 97.9 Height (in): 70 Pulse (bpm): 73 Weight (lbs): 296  Respiratory Rate (breaths/min): 18 Body Mass Index (BMI): 42.5 Blood Pressure (mmHg):  162/78 Reference Range: 80 - 120 mg / dl Electronic Signature(s) Signed: 08/21/2015 5:10:55 PM By: Alejandro Mulling Entered By: Alejandro Mulling on 08/21/2015 15:07:40

## 2015-08-22 NOTE — Progress Notes (Signed)
ANTWANE, GROSE ([id number]) Visit Report for 08/21/2015 Chief Complaint Document Details Patient Name: [name], [name] 08/21/2015 2:45 Date of Service: PM Medical Record [id number] Number: Patient Account Number: [id number] Date of Birth/Sex: 07-19-1941 (75 y.o. Male) Treating RN: Ashok Cordia, Debi Primary Care [facility], Other Clinician: Physician: RUPASHREE Treating Javionna Leder, Verl Blalock, Physician/Extender: Referring Physician: Armida Sans in Treatment: 8 Information Obtained from: Patient Chief Complaint Patient presents to the wound care center for a consult due non healing wound. nonhealing wound of the left lower extremity which has been present for about a month and a half. Electronic Signature(s) Signed: 08/21/2015 3:31:37 PM By: Evlyn Kanner MD, FACS Entered By: Evlyn Kanner on 08/21/2015 15:31:37 Wingrove, [name] ([id number]) -------------------------------------------------------------------------------- HPI Details Patient Name: [name], [name] 08/21/2015 2:45 Date of Service: PM Medical Record [id number] Number: Patient Account Number: [id number] Date of Birth/Sex: 17-Sep-1940 (75 y.o. Male) Treating RN: Ashok Cordia, Debi Primary Care [facility], Other Clinician: Physician: RUPASHREE Treating Rozella Servello, Verl Blalock, Physician/Extender: Referring Physician: Armida Sans in Treatment: 8 History of Present Illness Location: left lower extremity wound Quality: Patient reports experiencing a dull pain to affected area(s). Severity: Patient states wound are getting worse. Duration: Patient has had the wound for < 6 weeks prior to presenting for treatment Timing: Pain in wound is Intermittent (comes and goes Context: The wound occurred when the patient had a fall and cut his leg which required several sutures. He is also been on several antibiotics including ciprofloxacin and clindamycin. Modifying Factors: Consults to this date include:he has  had bilateral lower limb lymphedema and has been using lymphedema pumps. Associated Signs and Symptoms: Patient reports having difficulty standing for long periods. HPI Description: 75 year old gentleman who has a history of seizures and schizophrenia with bipolar disorder enhances in a assisted living facility. He also has CHF, COPD, GERD and remote history of DVT. He has been treated by the vascular group at [facility] for bilateral lymphedema and has been using lymphedema pumps. This problem started on September 30 when he had a lacerated wound after a fall. The staples were left in for about 10 days and when the staples were removed the wound opened out. Since then he's had several issues with wound infection and the most recent culture showed a heavy growth of Pseudomonas aeruginosa and Staphylococcus aureus and he has received clindamycin and the most recent past and from today he is on levofloxacin. Addendum:Notes were reviewed from a previous vascular consult at [facility] by Dr. Gilda Crease who saw him on 05/27/2014. He had recommended graduated compression stockings class I and also added lymph pumps to improve the control of the patient's lymphedema. 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 Diffuse soft-tissue swelling correlate with bone scan recommended. 07/15/2015 -- he is still awaiting insurance clearance for his MRI and other than that he has no other changes. 07/21/2015 -- he now has the insurance clearance and his MRI has been scheduled for December 28th. 08/11/2014 -- the MRI of his left lower extremity showed there was no bone destruction or periosteal reaction and there was a soft tissue wound along the lateral aspect of the distal left lower leg with skin thickening but there was no drainable fluid collection to suggest an abscess. Electronic  Signature(s) Signed: 08/21/2015 3:31:41 PM By: Evlyn Kanner MD, FACS Whidden, [name] ([id number]) Entered By: Evlyn Kanner on 08/21/2015 15:31:41 Verga, [name] ([id number]) -------------------------------------------------------------------------------- Physical Exam Details Patient Name:  [name], [name] 08/21/2015 2:45 Date of Service: PM Medical Record [id number] Number: Patient Account Number: [id number] Date of Birth/Sex: [birth year] (75 y.o. Male) Treating RN: Ashok Cordia, Debi Primary Care [facility], Other Clinician: Physician: RUPASHREE Treating Herald Vallin, Verl Blalock, Physician/Extender: Referring Physician: Armida Sans in Treatment: 8 Constitutional . Pulse regular. Respirations normal and unlabored. Afebrile. . Eyes Nonicteric. Reactive to light. Ears, Nose, Mouth, and Throat Lips, teeth, and gums WNL.Marland Kitchen Moist mucosa without lesions. Neck supple and nontender. No palpable supraclavicular or cervical adenopathy. Normal sized without goiter. Respiratory WNL. No retractions.. Cardiovascular Pedal Pulses WNL. No clubbing, cyanosis or edema. Lymphatic No adneopathy. No adenopathy. No adenopathy. Musculoskeletal Adexa without tenderness or enlargement.. Digits and nails w/o clubbing, cyanosis, infection, petechiae, ischemia, or inflammatory conditions.. Integumentary (Hair, Skin) No suspicious lesions. No crepitus or fluctuance. No peri-wound warmth or erythema. No masses.Marland Kitchen Psychiatric Judgement and insight Intact.. No evidence of depression, anxiety, or agitation.. Notes the wound is completely healed on the left lower extremity and the surrounding edema is down to minimal. Electronic Signature(s) Signed: 08/21/2015 3:32:09 PM By: Evlyn Kanner MD, FACS Entered By: Evlyn Kanner on 08/21/2015 15:32:08 Knabe, [name] ([id number]) -------------------------------------------------------------------------------- Physician Orders Details Patient Name:  [name] Date of Service: 08/21/2015 2:45 PM Medical Record Patient Account Number: [id number] Number: Afful, RN, BSN, Treating RN: Date of Birth/Sex: 1940-08-23 (75 y.o. Male) [facility], Other Clinician: Physician: RUPASHREE Treating Cyerra Yim, Verl Blalock, Physician/Extender: Referring Physician: Armida Sans in Treatment: 8 Verbal / Phone Orders: Yes Clinician: Afful, RN, BSN, Rita Read Back and Verified: Yes Diagnosis Coding Edema Control o Elevate legs to the level of the heart and pump ankles as often as possible Discharge From [facility] Services o Discharge from Wound Care Center - Treatment completed. Instructed to use Lymphedema pumps 1hour twice a day. Electronic Signature(s) Signed: 08/21/2015 3:21:05 PM By: Elpidio Eric BSN, RN Signed: 08/21/2015 4:38:48 PM By: Evlyn Kanner MD, FACS Entered By: Elpidio Eric on 08/21/2015 15:21:05 Doutt, [name] ([id number]) -------------------------------------------------------------------------------- Problem List Details Patient Name: [name], [name] 08/21/2015 2:45 Date of Service: PM Medical Record [id number] Number: Patient Account Number: [id number] Date of Birth/Sex: [birth year] (75 y.o. 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 S81.811A Laceration without foreign body, right lower leg, initial 06/24/2015 Yes encounter E66.01 Morbid (severe) obesity due to excess calories 06/24/2015 Yes I50.9 Heart failure, unspecified 06/24/2015 Yes Inactive Problems Resolved Problems Electronic  Signature(s) Signed: 08/21/2015 3:31:32 PM By: Evlyn Kanner MD, FACS Entered By: Evlyn Kanner on 08/21/2015 15:31:32 Dolberry, [name] ([id number]) -------------------------------------------------------------------------------- Progress Note Details Patient Name: [name], [name] 08/21/2015 2:45 Date of Service: PM Medical Record [id number] Number: Patient Account Number: [id number] Date of Birth/Sex: 06/01/1941 (75 y.o. Male) Treating RN: Phillis Haggis Primary Care [facility], Other Clinician: Physician: RUPASHREE Treating Aftan Vint, Verl Blalock, Physician/Extender: Referring Physician: Armida Sans in Treatment: 8 Subjective Chief Complaint Information obtained from Patient Patient presents to the wound care center for a consult due non healing wound. nonhealing wound of the left lower extremity which has been present for about a month and a half. History of Present Illness (HPI) The following HPI elements were documented for the patient's wound: Location: left lower extremity wound Quality: Patient reports experiencing a dull pain to affected  area(s). Severity: Patient states wound are getting worse. Duration: Patient has had the wound for < 6 weeks prior to presenting for treatment Timing: Pain in wound is Intermittent (comes and goes Context: The wound occurred when the patient had a fall and cut his leg which required several sutures. He is also been on several antibiotics including ciprofloxacin and clindamycin. Modifying Factors: Consults to this date include:he has had bilateral lower limb lymphedema and has been using lymphedema pumps. Associated Signs and Symptoms: Patient reports having difficulty standing for long periods. 75 year old gentleman who has a history of seizures and schizophrenia with bipolar disorder enhances in a assisted living facility. He also has CHF, COPD, GERD and remote history of DVT. He has been treated by the vascular group at  [facility] for bilateral lymphedema and has been using lymphedema pumps. This problem started on September 30 when he had a lacerated wound after a fall. The staples were left in for about 10 days and when the staples were removed the wound opened out. Since then he's had several issues with wound infection and the most recent culture showed a heavy growth of Pseudomonas aeruginosa and Staphylococcus aureus and he has received clindamycin and the most recent past and from today he is on levofloxacin. Addendum:Notes were reviewed from a previous vascular consult at [facility] by Dr. Gilda CreaseSchnier who saw him on 05/27/2014. He had recommended graduated compression stockings class I and also added lymph pumps to improve the control of the patient's lymphedema. 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 Diffuse soft-tissue swelling correlate with bone scan recommended. 07/15/2015 -- he is still awaiting insurance clearance for his MRI and other than that he has no other changes. [name] DarbyCRAWFORD, Emmerson L. ([id number]) 07/21/2015 -- he now has the insurance clearance and his MRI has been scheduled for December 28th. 08/11/2014 -- the MRI of his left lower extremity showed there was no bone destruction or periosteal reaction and there was a soft tissue wound along the lateral aspect of the distal left lower leg with skin thickening but there was no drainable fluid collection to suggest an abscess. Objective Constitutional Pulse regular. Respirations normal and unlabored. Afebrile. Vitals Time Taken: 3:06 PM, Height: 70 in, Weight: 296 lbs, BMI: 42.5, Temperature: 97.9 F, Pulse: 73 bpm, Respiratory Rate: 18 breaths/min, Blood Pressure: 162/78 mmHg. Eyes Nonicteric. Reactive to light. Ears, Nose, Mouth, and Throat Lips, teeth, and gums WNL.Marland Kitchen. Moist mucosa without lesions. Neck supple and  nontender. No palpable supraclavicular or cervical adenopathy. Normal sized without goiter. Respiratory WNL. No retractions.. Cardiovascular Pedal Pulses WNL. No clubbing, cyanosis or edema. Lymphatic No adneopathy. No adenopathy. No adenopathy. Musculoskeletal Adexa without tenderness or enlargement.. Digits and nails w/o clubbing, cyanosis, infection, petechiae, ischemia, or inflammatory conditions.Marland Kitchen. Psychiatric Judgement and insight Intact.. No evidence of depression, anxiety, or agitation.. General Notes: the wound is completely healed on the left lower extremity and the surrounding edema is down to minimal. Integumentary (Hair, Skin) No suspicious lesions. No crepitus or fluctuance. No peri-wound warmth or erythema. No masses.[name]. Krall, Doy HutchingJERRY L. ([id number]) Wound #1 status is Healed - Epithelialized. Original cause of wound was Trauma. The wound is located on the Left,Lateral Lower Leg. The wound measures 0cm length x 0cm width x 0cm depth; 0cm^2 area and 0cm^3 volume. The wound is limited to skin breakdown. There is a none present amount of drainage noted. The wound margin is flat and intact.  There is large (67-100%) red granulation within the wound bed. There is no necrotic tissue within the wound bed. The periwound skin appearance exhibited: Dry/Scaly, Hemosiderin Staining. The periwound skin appearance did not exhibit: Callus, Crepitus, Excoriation, Fluctuance, Friable, Induration, Localized Edema, Rash, Scarring, Maceration, Moist, Atrophie Blanche, Cyanosis, Ecchymosis, Mottled, Pallor, Rubor, Erythema. Periwound temperature was noted as No Abnormality. Assessment Active Problems ICD-10 I89.0 - Lymphedema, not elsewhere classified L97.222 - Non-pressure chronic ulcer of left calf with fat layer exposed S81.811A - Laceration without foreign body, right lower leg, initial encounter E66.01 - Morbid (severe) obesity due to excess calories I50.9 - Heart failure,  unspecified Having completely healed his wound I have recommended he use his lymphedema pump twice a day for an hour each and continue elevation and exercise as planned. He is discharged from the wound care services and be seen back as needed. Plan Edema Control: Elevate legs to the level of the heart and pump ankles as often as possible Discharge From [facility] Services: Discharge from Wound Care Center - Treatment completed. Instructed to use Lymphedema pumps 1hour twice a day. SQUARE, JOWETT. ([id number]) Having completely healed his wound I have recommended he use his lymphedema pump twice a day for an hour each and continue elevation and exercise as planned. He is discharged from the wound care services and be seen back as needed. Electronic Signature(s) Signed: 08/21/2015 3:32:58 PM By: Evlyn Kanner MD, FACS Entered By: Evlyn Kanner on 08/21/2015 15:32:58 Popson, [name] ([id number]) -------------------------------------------------------------------------------- SuperBill Details Patient Name: [name] Date of Service: 08/21/2015 Medical Record Patient Account Number: [id number] Number: Ashok Cordia Treating RN: Date of Birth/Sex: [birth year] (75 y.o. Male) [facility], Other Clinician: Primary Care Physician: RUPASHREE Treating Melquiades Kovar, Verl Blalock, Physician/Extender: Referring Physician: Armida Sans in Treatment: 8 Diagnosis Coding ICD-10 Codes Code Description I89.0 Lymphedema, not elsewhere classified L97.222 Non-pressure chronic ulcer of left calf with fat layer exposed S81.811A Laceration without foreign body, right lower leg, initial encounter E66.01 Morbid (severe) obesity due to excess calories I50.9 Heart failure, unspecified Facility Procedures CPT4 Code: [id number] Description: 74259 - WOUND CARE VISIT-LEV 2 EST PT Modifier: Quantity: 1 Physician Procedures CPT4 Code Description: [id number] 43329 - WC PHYS LEVEL 2 - EST PT  ICD-10 Description Diagnosis I89.0 Lymphedema, not elsewhere classified L97.222 Non-pressure chronic ulcer of left calf with fat layer S81.811A Laceration without foreign body, right lower  leg, init E66.01 Morbid (severe) obesity due to excess calories Modifier: exposed ial encounter Quantity: 1 Electronic Signature(s) Signed: 08/21/2015 4:38:48 PM By: Evlyn Kanner MD, FACS Signed: 08/21/2015 5:20:37 PM By: Elpidio Eric BSN, RN Previous Signature: 08/21/2015 3:33:18 PM Version By: Evlyn Kanner MD, FACS Entered By: Elpidio Eric on 08/21/2015 16:18:04

## 2016-04-23 ENCOUNTER — Encounter: Payer: Self-pay | Admitting: *Deleted

## 2016-04-23 ENCOUNTER — Inpatient Hospital Stay
Admission: EM | Admit: 2016-04-23 | Discharge: 2016-04-26 | DRG: 683 | Disposition: A | Discharge status: Home / Self Care | Payer: Medicare Other | Attending: Internal Medicine | Admitting: Internal Medicine

## 2016-04-23 ENCOUNTER — Emergency Department: Payer: Medicare Other

## 2016-04-23 DIAGNOSIS — T502X5A Adverse effect of carbonic-anhydrase inhibitors, benzothiadiazides and other diuretics, initial encounter: Secondary | ICD-10-CM | POA: Diagnosis present

## 2016-04-23 DIAGNOSIS — R1030 Lower abdominal pain, unspecified: Secondary | ICD-10-CM | POA: Diagnosis present

## 2016-04-23 DIAGNOSIS — K219 Gastro-esophageal reflux disease without esophagitis: Secondary | ICD-10-CM | POA: Diagnosis present

## 2016-04-23 DIAGNOSIS — N3000 Acute cystitis without hematuria: Secondary | ICD-10-CM | POA: Diagnosis present

## 2016-04-23 DIAGNOSIS — R748 Abnormal levels of other serum enzymes: Secondary | ICD-10-CM | POA: Diagnosis present

## 2016-04-23 DIAGNOSIS — I110 Hypertensive heart disease with heart failure: Secondary | ICD-10-CM | POA: Diagnosis present

## 2016-04-23 DIAGNOSIS — K5900 Constipation, unspecified: Secondary | ICD-10-CM | POA: Diagnosis present

## 2016-04-23 DIAGNOSIS — I878 Other specified disorders of veins: Secondary | ICD-10-CM | POA: Diagnosis present

## 2016-04-23 DIAGNOSIS — E876 Hypokalemia: Secondary | ICD-10-CM | POA: Diagnosis present

## 2016-04-23 DIAGNOSIS — J449 Chronic obstructive pulmonary disease, unspecified: Secondary | ICD-10-CM | POA: Diagnosis present

## 2016-04-23 DIAGNOSIS — E860 Dehydration: Secondary | ICD-10-CM | POA: Diagnosis present

## 2016-04-23 DIAGNOSIS — N400 Enlarged prostate without lower urinary tract symptoms: Secondary | ICD-10-CM | POA: Diagnosis present

## 2016-04-23 DIAGNOSIS — W19XXXA Unspecified fall, initial encounter: Secondary | ICD-10-CM | POA: Diagnosis present

## 2016-04-23 DIAGNOSIS — I509 Heart failure, unspecified: Secondary | ICD-10-CM | POA: Diagnosis present

## 2016-04-23 DIAGNOSIS — Z86718 Personal history of other venous thrombosis and embolism: Secondary | ICD-10-CM

## 2016-04-23 DIAGNOSIS — R011 Cardiac murmur, unspecified: Secondary | ICD-10-CM | POA: Diagnosis present

## 2016-04-23 DIAGNOSIS — N179 Acute kidney failure, unspecified: Principal | ICD-10-CM | POA: Diagnosis present

## 2016-04-23 DIAGNOSIS — F319 Bipolar disorder, unspecified: Secondary | ICD-10-CM | POA: Diagnosis present

## 2016-04-23 DIAGNOSIS — R600 Localized edema: Secondary | ICD-10-CM

## 2016-04-23 DIAGNOSIS — R531 Weakness: Secondary | ICD-10-CM

## 2016-04-23 DIAGNOSIS — F209 Schizophrenia, unspecified: Secondary | ICD-10-CM | POA: Diagnosis present

## 2016-04-23 DIAGNOSIS — E785 Hyperlipidemia, unspecified: Secondary | ICD-10-CM | POA: Diagnosis present

## 2016-04-23 DIAGNOSIS — Z79899 Other long term (current) drug therapy: Secondary | ICD-10-CM

## 2016-04-23 DIAGNOSIS — N390 Urinary tract infection, site not specified: Secondary | ICD-10-CM | POA: Diagnosis present

## 2016-04-23 LAB — CBC WITH DIFFERENTIAL/PLATELET
BASOS PCT: 1 %
Basophils Absolute: 0 10*3/uL (ref 0–0.1)
EOS ABS: 0 10*3/uL (ref 0–0.7)
EOS PCT: 0 %
HCT: 37.8 % — ABNORMAL LOW (ref 40.0–52.0)
Hemoglobin: 12.9 g/dL — ABNORMAL LOW (ref 13.0–18.0)
LYMPHS ABS: 1.8 10*3/uL (ref 1.0–3.6)
Lymphocytes Relative: 19 %
MCH: 29.4 pg (ref 26.0–34.0)
MCHC: 34.2 g/dL (ref 32.0–36.0)
MCV: 85.7 fL (ref 80.0–100.0)
Monocytes Absolute: 0.6 10*3/uL (ref 0.2–1.0)
Monocytes Relative: 6 %
Neutro Abs: 6.8 10*3/uL — ABNORMAL HIGH (ref 1.4–6.5)
Neutrophils Relative %: 74 %
Platelets: 205 10*3/uL (ref 150–440)
RBC: 4.4 MIL/uL (ref 4.40–5.90)
RDW: 15.4 % — ABNORMAL HIGH (ref 11.5–14.5)
WBC: 9.3 10*3/uL (ref 3.8–10.6)

## 2016-04-23 LAB — COMPREHENSIVE METABOLIC PANEL
ALBUMIN: 3.6 g/dL (ref 3.5–5.0)
ALK PHOS: 98 U/L (ref 38–126)
ALT: 10 U/L — AB (ref 17–63)
AST: 19 U/L (ref 15–41)
Anion gap: 10 (ref 5–15)
BUN: 30 mg/dL — AB (ref 6–20)
CALCIUM: 8.9 mg/dL (ref 8.9–10.3)
CO2: 24 mmol/L (ref 22–32)
CREATININE: 2.64 mg/dL — AB (ref 0.61–1.24)
Chloride: 106 mmol/L (ref 101–111)
GFR calc Af Amer: 26 mL/min — ABNORMAL LOW (ref >60)
GFR calc non Af Amer: 22 mL/min — ABNORMAL LOW (ref >60)
Glucose, Bld: 141 mg/dL — ABNORMAL HIGH (ref 65–99)
Potassium: 3.4 mmol/L — ABNORMAL LOW (ref 3.5–5.1)
SODIUM: 140 mmol/L (ref 135–145)
Total Bilirubin: 0.7 mg/dL (ref 0.3–1.2)
Total Protein: 7.3 g/dL (ref 6.5–8.1)

## 2016-04-23 LAB — URINALYSIS COMPLETE WITH MICROSCOPIC (ARMC ONLY)
BACTERIA UA: NONE SEEN
Bilirubin Urine: NEGATIVE
Glucose, UA: NEGATIVE mg/dL
Hgb urine dipstick: NEGATIVE
Ketones, ur: NEGATIVE mg/dL
NITRITE: NEGATIVE
PH: 5 (ref 5.0–8.0)
Protein, ur: 30 mg/dL — AB
SQUAMOUS EPITHELIAL / LPF: NONE SEEN
Specific Gravity, Urine: 1.016 (ref 1.005–1.030)

## 2016-04-23 LAB — TSH: TSH: 1.772 u[IU]/mL (ref 0.350–4.500)

## 2016-04-23 LAB — LIPASE, BLOOD: LIPASE: 21 U/L (ref 11–51)

## 2016-04-23 LAB — TROPONIN I: TROPONIN I: 0.03 ng/mL — AB (ref <0.03)

## 2016-04-23 LAB — MRSA PCR SCREENING: MRSA by PCR: NEGATIVE

## 2016-04-23 MED ORDER — TERAZOSIN HCL 5 MG PO CAPS
5.0000 mg | ORAL_CAPSULE | Freq: Every day | ORAL | Status: DC
  Administered 2016-04-24 – 2016-04-25 (×2): 5 mg via ORAL
  Filled 2016-04-23 (×3): qty 1

## 2016-04-23 MED ORDER — ARIPIPRAZOLE 5 MG PO TABS
20.0000 mg | ORAL_TABLET | Freq: Every day | ORAL | Status: DC
  Administered 2016-04-24 – 2016-04-26 (×3): 20 mg via ORAL
  Filled 2016-04-23 (×4): qty 4

## 2016-04-23 MED ORDER — VITAMIN D 1000 UNITS PO TABS
1000.0000 [IU] | ORAL_TABLET | Freq: Every day | ORAL | Status: DC
  Administered 2016-04-24 – 2016-04-26 (×3): 1000 [IU] via ORAL
  Filled 2016-04-23 (×4): qty 1

## 2016-04-23 MED ORDER — DEXTROSE 5 % IV SOLN
1.0000 g | INTRAVENOUS | Status: DC
  Administered 2016-04-23 – 2016-04-25 (×3): 1 g via INTRAVENOUS
  Filled 2016-04-23 (×5): qty 10

## 2016-04-23 MED ORDER — HYDROCODONE-ACETAMINOPHEN 5-325 MG PO TABS
1.0000 | ORAL_TABLET | ORAL | Status: DC | PRN
  Administered 2016-04-23: 2 via ORAL
  Administered 2016-04-24 – 2016-04-26 (×3): 1 via ORAL
  Filled 2016-04-23: qty 2
  Filled 2016-04-23 (×3): qty 1

## 2016-04-23 MED ORDER — POTASSIUM CHLORIDE CRYS ER 10 MEQ PO TBCR
10.0000 meq | EXTENDED_RELEASE_TABLET | Freq: Every day | ORAL | Status: DC
  Administered 2016-04-23 – 2016-04-26 (×4): 10 meq via ORAL
  Filled 2016-04-23 (×6): qty 1

## 2016-04-23 MED ORDER — ACETAMINOPHEN 325 MG PO TABS: 650.0000 mg | ORAL_TABLET | Freq: Four times a day (QID) | ORAL | Status: DC | PRN

## 2016-04-23 MED ORDER — DOCUSATE SODIUM 100 MG PO CAPS
100.0000 mg | ORAL_CAPSULE | Freq: Two times a day (BID) | ORAL | Status: DC
  Administered 2016-04-23 – 2016-04-26 (×6): 100 mg via ORAL
  Filled 2016-04-23 (×6): qty 1

## 2016-04-23 MED ORDER — CARVEDILOL 25 MG PO TABS
25.0000 mg | ORAL_TABLET | Freq: Two times a day (BID) | ORAL | Status: DC
  Administered 2016-04-24 – 2016-04-26 (×5): 25 mg via ORAL
  Filled 2016-04-23 (×6): qty 1

## 2016-04-23 MED ORDER — PHENOBARBITAL 32.4 MG PO TABS
129.6000 mg | ORAL_TABLET | ORAL | Status: DC
Start: 2016-04-24 — End: 2016-04-26
  Administered 2016-04-24 – 2016-04-26 (×2): 129.6 mg via ORAL
  Filled 2016-04-23 (×2): qty 4

## 2016-04-23 MED ORDER — AMLODIPINE BESYLATE 5 MG PO TABS
5.0000 mg | ORAL_TABLET | Freq: Every day | ORAL | Status: DC
  Administered 2016-04-24 – 2016-04-26 (×3): 5 mg via ORAL
  Filled 2016-04-23 (×3): qty 1

## 2016-04-23 MED ORDER — ENOXAPARIN SODIUM 40 MG/0.4ML ~~LOC~~ SOLN
40.0000 mg | SUBCUTANEOUS | Status: DC
  Administered 2016-04-23 – 2016-04-25 (×3): 40 mg via SUBCUTANEOUS
  Filled 2016-04-23 (×3): qty 0.4

## 2016-04-23 MED ORDER — ONDANSETRON HCL 4 MG PO TABS: 4.0000 mg | ORAL_TABLET | Freq: Four times a day (QID) | ORAL | Status: DC | PRN

## 2016-04-23 MED ORDER — PANTOPRAZOLE SODIUM 40 MG PO TBEC
40.0000 mg | DELAYED_RELEASE_TABLET | Freq: Every day | ORAL | Status: DC
  Administered 2016-04-24 – 2016-04-26 (×3): 40 mg via ORAL
  Filled 2016-04-23 (×3): qty 1

## 2016-04-23 MED ORDER — ONDANSETRON HCL 4 MG/2ML IJ SOLN: 4.0000 mg | Freq: Four times a day (QID) | INTRAMUSCULAR | Status: DC | PRN

## 2016-04-23 MED ORDER — SODIUM CHLORIDE 0.9 % IV SOLN
INTRAVENOUS | Status: DC
  Administered 2016-04-23: 18:00:00 via INTRAVENOUS

## 2016-04-23 MED ORDER — POTASSIUM CHLORIDE CRYS ER 20 MEQ PO TBCR
20.0000 meq | EXTENDED_RELEASE_TABLET | Freq: Once | ORAL | Status: AC
  Administered 2016-04-23: 20 meq via ORAL
  Filled 2016-04-23: qty 1

## 2016-04-23 MED ORDER — BUPROPION HCL ER (XL) 300 MG PO TB24
300.0000 mg | ORAL_TABLET | Freq: Every day | ORAL | Status: DC
  Administered 2016-04-24 – 2016-04-26 (×3): 300 mg via ORAL
  Filled 2016-04-23 (×3): qty 1

## 2016-04-23 MED ORDER — CLOTRIMAZOLE 1 % EX CREA
1.0000 "application " | TOPICAL_CREAM | Freq: Two times a day (BID) | CUTANEOUS | Status: DC
  Administered 2016-04-23 – 2016-04-26 (×5): 1 via TOPICAL
  Filled 2016-04-23: qty 15

## 2016-04-23 MED ORDER — PRAVASTATIN SODIUM 40 MG PO TABS
40.0000 mg | ORAL_TABLET | Freq: Every day | ORAL | Status: DC
  Administered 2016-04-24 – 2016-04-26 (×3): 40 mg via ORAL
  Filled 2016-04-23 (×3): qty 1

## 2016-04-23 MED ORDER — ASPIRIN EC 81 MG PO TBEC
81.0000 mg | DELAYED_RELEASE_TABLET | Freq: Every day | ORAL | Status: DC
  Administered 2016-04-23 – 2016-04-26 (×4): 81 mg via ORAL
  Filled 2016-04-23 (×4): qty 1

## 2016-04-23 MED ORDER — ACETAMINOPHEN 650 MG RE SUPP: 650.0000 mg | Freq: Four times a day (QID) | RECTAL | Status: DC | PRN

## 2016-04-23 NOTE — ED Provider Notes (Signed)
[facility] Emergency Department Provider Note ____________________________________________   I have reviewed the triage vital signs and the triage nursing note.  HISTORY  Chief Complaint Fall; Back Pain; and Hip Pain   Historian Patient  HPI [name] is a 75 y.o. male with underlying psychiatric illness, lives at a group facility, here for abdominal pain and bilateral calf pain. He states that he had a fall yesterday where he was feeling weak all over and then fell onto his front side. He denies head injury. He states that he landed on his stomach and he still having some stomach discomfort although he is not sure that this was related to the fall. He is also complaining of bilateral leg pain and points to his calves. He has significant swelling there, and states that actually the swelling is somewhat improved from baseline. He states that he has started in took a few steps since the fall yesterday and does not think that he has a fracture anywhere. He denies any significant back pain to me, states that occasionally he has back pain.  He continues to feel weak, weak all over without focal weakness.   Past Medical History:  Diagnosis Date  . Bipolar 1 disorder (HCC)   . CHF (congestive heart failure) (HCC)   . COPD (chronic obstructive pulmonary disease) (HCC)   . DVT (deep venous thrombosis) (HCC)   . GERD (gastroesophageal reflux disease)   . High cholesterol   . Hypertension   . Schizophrenia (HCC)   . Seizure [facility])     Patient Active Problem List   Diagnosis Date Noted  . 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 Pneumonia 02/21/2015  . ARF (acute renal failure) (HCC) 02/21/2015  . Hypokalemia 02/21/2015  . Leukocytosis 02/21/2015    History reviewed. No pertinent surgical history.  Prior to Admission medications   Medication Sig Start Date End Date Taking? Authorizing Provider  amLODipine (NORVASC)  5 MG tablet Take 1 tablet by mouth daily.    Historical Provider, MD  ARIPiprazole (ABILIFY) 20 MG tablet Take 1 tablet by mouth daily.    Historical Provider, MD  aspirin EC 81 MG tablet Take 1 tablet by mouth daily.    Historical Provider, MD  buPROPion (BUDEPRION XL) 300 MG 24 hr tablet Take 1 tablet by mouth daily.    Historical Provider, MD  carvedilol (COREG) 25 MG tablet Take 1 tablet by mouth 2 (two) times daily.    Historical Provider, MD  Cholecalciferol (VITAMIN D-1000 MAX ST) 1000 UNITS tablet Take 1 tablet by mouth daily.    Historical Provider, MD  cloNIDine (CATAPRES) 0.2 MG tablet Take 1 tablet by mouth 2 (two) times daily.    Historical Provider, MD  clotrimazole (LOTRIMIN) 1 % cream Apply 1 application topically 2 (two) times daily.    Historical Provider, MD  docusate sodium (COLACE) 100 MG capsule Take 1 capsule by mouth 2 (two) times daily.    Historical Provider, MD  furosemide (LASIX) 40 MG tablet Take 1 tablet by mouth 2 (two) times daily.    Historical Provider, MD  levofloxacin (LEVAQUIN) 750 MG tablet Take 1 tablet (750 mg total) by mouth daily. 02/24/15   Shaune Pollack, MD  omeprazole (PRILOSEC) 20 MG capsule Take 1 capsule by mouth daily.    Historical Provider, MD  PHENobarbital (LUMINAL) 64.8 MG tablet Take 2 tablets by mouth every other day.    Historical Provider, MD  potassium chloride (  KLOR-CON 10) 10 MEQ tablet Take 1 tablet (10 mEq total) by mouth daily. 02/24/15   Shaune PollackQing Chen, MD  pravastatin (PRAVACHOL) 40 MG tablet Take 1 tablet by mouth at bedtime.    Historical Provider, MD  terazosin (HYTRIN) 5 MG capsule Take 1 capsule by mouth at bedtime.    Historical Provider, MD    Allergies  Allergen Reactions  . Penicillins     History reviewed. No pertinent family history.  Social History Social History  Substance Use Topics  . Smoking status: Never Smoker  . Smokeless tobacco: Not on file  . Alcohol use No    Review of Systems  Constitutional: Negative  for fever. Eyes: Negative for visual changes. ENT: Negative for sore throat. Cardiovascular: Negative for chest pain. Respiratory: Negative for shortness of breath. Gastrointestinal: Negative for vomiting and diarrhea. Genitourinary: Reports some decrease in urine output. Musculoskeletal: Negative for back pain. Skin: Negative for rash. Neurological: Negative for headache. 10 point Review of Systems otherwise negative ____________________________________________   PHYSICAL EXAM:  VITAL SIGNS: ED Triage Vitals  Enc Vitals Group     BP 04/23/16 1308 93/62     Pulse Rate 04/23/16 1308 84     Resp 04/23/16 1308 18     Temp 04/23/16 1308 98.5 F (36.9 C)     Temp Source 04/23/16 1308 Oral     SpO2 04/23/16 1306 96 %     Weight 04/23/16 1308 296 lb (134.3 kg)     Height 04/23/16 1308 5\' 7"  (1.702 m)     Head Circumference --      Peak Flow --      Pain Score 04/23/16 1309 7     Pain Loc --      Pain Edu? --      Excl. in GC? --      Constitutional: Alert and oriented. Well appearing and in no distress. HEENT   Head: Normocephalic and atraumatic.      Eyes: Conjunctivae are normal. PERRL. Normal extraocular movements.      Ears:         Nose: No congestion/rhinnorhea.   Mouth/Throat: Mucous membranes are moist.   Neck: No stridor. Cardiovascular/Chest: Normal rate, regular rhythm.  No murmurs, rubs, or gallops. Respiratory: Normal respiratory effort without tachypnea nor retractions. Breath sounds are clear and equal bilaterally. No wheezes/rales/rhonchi. Gastrointestinal: Soft. No distention, no guarding, no rebound. Mild discomfort mid abdomen.  No bruising.  Genitourinary/rectal:Deferred Musculoskeletal: Stable and nontender pelvis and hip on range of motion.  Nontender LE with axial load.  Some calf pain with palpation bilaterally to significantly edematous LE with signs of venous insufficiency to the skin. Neurologic:  Normal speech and language. No gross or  focal neurologic deficits are appreciated. Skin:  Skin is warm, dry and intact. No rash noted. Psychiatric: Flat affect, but overall speech and behavior are normal. Patient exhibits appropriate insight and judgment.   ____________________________________________  LABS (pertinent positives/negatives)  Labs Reviewed  COMPREHENSIVE METABOLIC PANEL - Abnormal; Notable for the following:       Result Value   Potassium 3.4 (*)    Glucose, Bld 141 (*)    BUN 30 (*)    Creatinine, Ser 2.64 (*)    ALT 10 (*)    GFR calc non Af Amer 22 (*)    GFR calc Af Amer 26 (*)    All other components within normal limits  CBC WITH DIFFERENTIAL/PLATELET - Abnormal; Notable for the following:    Hemoglobin 12.9 (*)  HCT 37.8 (*)    RDW 15.4 (*)    Neutro Abs 6.8 (*)    All other components within normal limits  TROPONIN I - Abnormal; Notable for the following:    Troponin I 0.03 (*)    All other components within normal limits  URINALYSIS COMPLETEWITH MICROSCOPIC ([facility] ONLY) - Abnormal; Notable for the following:    Color, Urine AMBER (*)    APPearance HAZY (*)    Protein, ur 30 (*)    Leukocytes, UA 3+ (*)    All other components within normal limits  URINE CULTURE  LIPASE, BLOOD    ____________________________________________    EKG I, Governor Rooks, MD, the attending physician have personally viewed and interpreted all ECGs.  77 bpm. Normal sinus rhythm. Nonspecific intraventricular conduction delay. Normal axis. Nonspecific T-wave with T-wave inversions inferiorly and laterally. Signs of LVH.  Similar to prior ecg, slightly more pronounced t wave inversions. ____________________________________________  RADIOLOGY All Xrays were viewed by me. Imaging interpreted by Radiologist.  Bilateral lower extremity venous Doppler:  IMPRESSION: No evidence of deep venous thrombosis in the lower extremities. __________________________________________  PROCEDURES  Procedure(s) performed:  None  Critical Care performed: None  ____________________________________________   ED COURSE / ASSESSMENT AND PLAN  Pertinent labs & imaging results that were available during my care of the patient were reviewed by me and considered in my medical decision making (see chart for details).   Mr. Bluitt came in for abdominal pain and calf pain per his complaint to me. Nursing home notes also indicated patient had complained of hip and back pain and foul-smelling urine. He reports he felt weak and had a mechanical fall yesterday, but he has been able to walk since then although he has some tenderness in his legs. On exam I am not suspicious of a fracture as he really does not have any tenderness when I range each of the joints of the lower extremities.  In terms of the abdominal pain, no associated symptoms, some mild tenderness to lower abdomen.  Will be guided by ua, labs and reexamination.  I don't have a large concern for traumatic cause of abdominal pain.   On laboratory findings, his urinalysis is equivocal, I will send a culture. His renal function is showing acute renal failure with last recent creatinine 1.03, now 2.64, more than doubled.  Started fluid bolus, patient will be admitted for observation and treatment of acute renal failure which is I suspect due to dehydration and the source of his jaws weakness.    CONSULTATIONS:   Hospitalist for admission.   Patient / Family / Caregiver informed of clinical course, medical decision-making process, and agree with plan.   I discussed return precautions, follow-up instructions, and discharge instructions with patient and/or family.   ___________________________________________   FINAL CLINICAL IMPRESSION(S) / ED DIAGNOSES   Final diagnoses:  Bilateral lower extremity edema  Lower abdominal pain  Generalized weakness  Acute renal failure, unspecified acute renal failure type [facility])              Note: This  dictation was prepared with Dragon dictation. Any transcriptional errors that result from this process are unintentional    Governor Rooks, MD 04/23/16 1557

## 2016-04-23 NOTE — ED Notes (Signed)
Dr. Lord notified of critical trop 

## 2016-04-23 NOTE — H&P (Signed)
[facility] [place] at [facility]   PATIENT NAME: [name] SeamenJerry [name]    MR#:  [id number]  DATE OF BIRTH:  09-08-1940  DATE OF ADMISSION:  04/23/2016  PRIMARY CARE PHYSICIAN: Lorenda Ishiharaupashree Varadarajan, MD (Inactive)   REQUESTING/REFERRING PHYSICIAN: Governor Rooksebecca Lord M.D.  CHIEF COMPLAINT:   Chief Complaint  Patient presents with  . Fall  . Back Pain  . Hip Pain    HISTORY OF PRESENT ILLNESS: [name]  is a 75 y.o. male with a known history of Bipolar 1 disorder, chronic lower extremity swelling, congestive heart failure, COPD, previous history of DVT, GERD, hyperlipidemia, hypertension, schizophrenia and seizure who currently resides in [facility] group facility. Who states that he has not been feeling well for the past 1 week. He has been very weak and has had generalized weakness. Yesterday he fell and hit his abdomen. Since she's been having some abdominal pain and pain in his legs and back.. Patient has had some vomiting. And nausea. He has not been eating drinking much. Denies any chest pains or palpitations. No shortness of breath. PAST MEDICAL HISTORY:   Past Medical History:  Diagnosis Date  . Bipolar 1 disorder (HCC)   . CHF (congestive heart failure) (HCC)   . COPD (chronic obstructive pulmonary disease) (HCC)   . DVT (deep venous thrombosis) (HCC)   . GERD (gastroesophageal reflux disease)   . High cholesterol   . Hypertension   . Schizophrenia (HCC)   . Seizure (HCC)     PAST SURGICAL HISTORY: History reviewed. No pertinent surgical history.  SOCIAL HISTORY:  Social History  Substance Use Topics  . Smoking status: Never Smoker  . Smokeless tobacco: Never Used  . Alcohol use No    FAMILY HISTORY: History reviewed. No pertinent family history.  DRUG ALLERGIES:  Allergies  Allergen Reactions  . Penicillins     REVIEW OF SYSTEMS:   CONSTITUTIONAL: No fever,Positive fatigue and weakness.  EYES: No blurred or double vision.  EARS,  NOSE, AND THROAT: No tinnitus or ear pain.  RESPIRATORY: No cough, shortness of breath, wheezing or hemoptysis.  CARDIOVASCULAR: No chest pain, orthopnea, chronic edema.  GASTROINTESTINAL: Positive nausea, no vomiting, diarrhea or abdominal pain.  GENITOURINARY: No dysuria, hematuria.  ENDOCRINE: No polyuria, nocturia,  HEMATOLOGY: No anemia, easy bruising or bleeding SKIN: No rash or lesion. MUSCULOSKELETAL: No joint pain or arthritis.   NEUROLOGIC: No tingling, numbness, weakness.  PSYCHIATRY: No anxiety or depression.   MEDICATIONS AT HOME:  Prior to Admission medications   Medication Sig Start Date End Date Taking? Authorizing Provider  amLODipine (NORVASC) 5 MG tablet Take 1 tablet by mouth daily.    Historical Provider, MD  ARIPiprazole (ABILIFY) 20 MG tablet Take 1 tablet by mouth daily.    Historical Provider, MD  aspirin EC 81 MG tablet Take 1 tablet by mouth daily.    Historical Provider, MD  buPROPion (BUDEPRION XL) 300 MG 24 hr tablet Take 1 tablet by mouth daily.    Historical Provider, MD  carvedilol (COREG) 25 MG tablet Take 1 tablet by mouth 2 (two) times daily.    Historical Provider, MD  Cholecalciferol (VITAMIN D-1000 MAX ST) 1000 UNITS tablet Take 1 tablet by mouth daily.    Historical Provider, MD  cloNIDine (CATAPRES) 0.2 MG tablet Take 1 tablet by mouth 2 (two) times daily.    Historical Provider, MD  clotrimazole (LOTRIMIN) 1 % cream Apply 1 application topically 2 (two) times daily.    Historical Provider, MD  docusate sodium (COLACE) 100 MG capsule Take 1 capsule by mouth 2 (two) times daily.    Historical Provider, MD  furosemide (LASIX) 40 MG tablet Take 1 tablet by mouth 2 (two) times daily.    Historical Provider, MD  levofloxacin (LEVAQUIN) 750 MG tablet Take 1 tablet (750 mg total) by mouth daily. 02/24/15   Shaune Pollack, MD  omeprazole (PRILOSEC) 20 MG capsule Take 1 capsule by mouth daily.    Historical Provider, MD  PHENobarbital (LUMINAL) 64.8 MG tablet Take  2 tablets by mouth every other day.    Historical Provider, MD  potassium chloride (KLOR-CON 10) 10 MEQ tablet Take 1 tablet (10 mEq total) by mouth daily. 02/24/15   Shaune Pollack, MD  pravastatin (PRAVACHOL) 40 MG tablet Take 1 tablet by mouth at bedtime.    Historical Provider, MD  terazosin (HYTRIN) 5 MG capsule Take 1 capsule by mouth at bedtime.    Historical Provider, MD      PHYSICAL EXAMINATION:   VITAL SIGNS: Blood pressure (!) 104/57, pulse 81, temperature 98.5 F (36.9 C), temperature source Oral, resp. rate 12, height 5\' 7"  (1.702 m), weight 296 lb (134.3 kg), SpO2 95 %.  GENERAL:  75 y.o.-year-old patient lying in the bed with no acute distress.  EYES: Pupils equal, round, reactive to light and accommodation. No scleral icterus. Extraocular muscles intact.  HEENT: Head atraumatic, normocephalic. Oropharynx and nasopharynx clear.  NECK:  Supple, no jugular venous distention. No thyroid enlargement, no tenderness.  LUNGS: Normal breath sounds bilaterally, no wheezing, rales,rhonchi or crepitation. No use of accessory muscles of respiration.  CARDIOVASCULAR: S1, S2 normal. Positive systolic murmur, rubs, or gallops.  ABDOMEN: Soft, nontender, nondistended. Bowel sounds present. No organomegaly or mass.  EXTREMITIES: 2+ pedal edema, cyanosis, or clubbing.  NEUROLOGIC: Cranial nerves II through XII are intact. Muscle strength 5/5 in all extremities. Sensation intact. Gait not checked.  PSYCHIATRIC: The patient is alert and oriented x 3.  SKIN: Erythema on both of his legs.   LABORATORY PANEL:   CBC  Recent Labs Lab 04/23/16 1315  WBC 9.3  HGB 12.9*  HCT 37.8*  PLT 205  MCV 85.7  MCH 29.4  MCHC 34.2  RDW 15.4*  LYMPHSABS 1.8  MONOABS 0.6  EOSABS 0.0  BASOSABS 0.0   ------------------------------------------------------------------------------------------------------------------  Chemistries   Recent Labs Lab 04/23/16 1315  NA 140  K 3.4*  CL 106  CO2 24   GLUCOSE 141*  BUN 30*  CREATININE 2.64*  CALCIUM 8.9  AST 19  ALT 10*  ALKPHOS 98  BILITOT 0.7   ------------------------------------------------------------------------------------------------------------------ estimated creatinine clearance is 32.9 mL/min (by C-G formula based on SCr of 2.64 mg/dL (H)). ------------------------------------------------------------------------------------------------------------------ No results for input(s): TSH, T4TOTAL, T3FREE, THYROIDAB in the last 72 hours.  Invalid input(s): FREET3   Coagulation profile No results for input(s): INR, PROTIME in the last 168 hours. ------------------------------------------------------------------------------------------------------------------- No results for input(s): DDIMER in the last 72 hours. -------------------------------------------------------------------------------------------------------------------  Cardiac Enzymes  Recent Labs Lab 04/23/16 1315  TROPONINI 0.03*   ------------------------------------------------------------------------------------------------------------------ Invalid input(s): POCBNP  ---------------------------------------------------------------------------------------------------------------  Urinalysis    Component Value Date/Time   COLORURINE AMBER (A) 04/23/2016 1408   APPEARANCEUR HAZY (A) 04/23/2016 1408   APPEARANCEUR Hazy 10/02/2012 0915   LABSPEC 1.016 04/23/2016 1408   LABSPEC 1.027 10/02/2012 0915   PHURINE 5.0 04/23/2016 1408   GLUCOSEU NEGATIVE 04/23/2016 1408   GLUCOSEU Negative 10/02/2012 0915   HGBUR NEGATIVE 04/23/2016 1408   BILIRUBINUR NEGATIVE 04/23/2016 1408   BILIRUBINUR Negative 10/02/2012  0915   KETONESUR NEGATIVE 04/23/2016 1408   PROTEINUR 30 (A) 04/23/2016 1408   NITRITE NEGATIVE 04/23/2016 1408   LEUKOCYTESUR 3+ (A) 04/23/2016 1408   LEUKOCYTESUR 1+ 10/02/2012 0915     RADIOLOGY: US Venous Img Lower Bilateral  Result Date:  04/23/2016 CLINICAL DATA:  Bilateral calf pain and swelling for 2 weeks. EXAM: BILATERAL LOWER EXTREMITY VENOUS DOPPLER ULTRASOUND TECHNIQUE: Gray-scale sonography with graded compression, as well as color Doppler and duplex ultrasound were performed to evaluate the lower extremity deep venous systems from the level of the common femoral vein and including the common femoral, femoral, profunda femoral, popliteal and calf veins including the posterior tibial, peroneal and gastrocnemius veins when visible. The superficial great saphenous vein was also interrogated. Spectral Doppler was utilized to evaluate flow at rest and with distal augmentation maneuvers in the common femoral, femoral and popliteal veins. COMPARISON:  None. FINDINGS: RIGHT LOWER EXTREMITY Common Femoral Vein: No evidence of thrombus. Normal compressibility, respiratory phasicity and response to augmentation. Saphenofemoral Junction: No evidence of thrombus. Normal compressibility and flow on color Doppler imaging. Profunda Femoral Vein: No evidence of thrombus. Normal compressibility and flow on color Doppler imaging. Femoral Vein: No evidence of thrombus. Normal compressibility, respiratory phasicity and response to augmentation. Popliteal Vein: No evidence of thrombus. Normal compressibility, respiratory phasicity and response to augmentation. Calf Veins: Visualized right deep calf veins are patent without thrombus. LEFT LOWER EXTREMITY Common Femoral Vein: No evidence of thrombus. Normal compressibility, respiratory phasicity and response to augmentation. Saphenofemoral Junction: No evidence of thrombus. Normal compressibility and flow on color Doppler imaging. Profunda Femoral Vein: No evidence of thrombus. Normal compressibility and flow on color Doppler imaging. Femoral Vein: No evidence of thrombus. Normal compressibility, respiratory phasicity and response to augmentation. Popliteal Vein: No evidence of thrombus. Normal compressibility,  respiratory phasicity and response to augmentation. Calf Veins: Visualized left deep calf veins are patent without thrombus. IMPRESSION: No evidence of deep venous thrombosis in the lower extremities. Electronically Signed   By: Richarda Overlie M.D.   On: 04/23/2016 15:49    EKG: Orders placed or performed during the hospital encounter of 04/23/16  . ED EKG  . ED EKG    IMPRESSION AND PLAN: Patient is a 75 year old presenting with generalized weakness fall  1. Generalized weakness and fall suspect due to UTI as well as dehydration We'll treat his urinary infection with ceftriaxone I will give him IV fluids he does have significant swelling in his lower extremity which is chronic We will follow his renal function  2. Urinary tract infection We'll treat with IV ceftriaxone and follow urine cultures  3. Acute renal failure Suspect due to overdiuresis poor by mouth intake Will give him IV fluids monitor his renal function  4. Hypokalemia replace potassium  5. Slightly elevated troponin suspected due to demand ischemia  6. Bipolar 1 disorder and schizophrenia continue  ablify and bupropion  7. Hypertension continue Norvasc and Coreg  8. Miscellaneous we'll do Lovenox for DVT prophylaxis   All the records are reviewed and case discussed with ED provider. Management plans discussed with the patient, family and they are in agreement.  CODE STATUS:    Code Status Orders        Start     Ordered   04/23/16 1622  Full code  Continuous     04/23/16 1622    Code Status History    Date Active Date Inactive Code Status Order ID Comments User Context   02/21/2015  3:37  PM 02/24/2015  4:42 PM Full Code [id number]  Shaune Pollack, MD Inpatient       TOTAL TIME TAKING CARE OF THIS PATIENT: 45 minutes.    Auburn Bilberry M.D on 04/23/2016 at 4:28 PM  Between 7am to 6pm - Pager - [phone]  After 6pm go to [email or web address] - password [email or web address] [facility]  [facility]  Office   [phone]  CC: Primary care physician; Lorenda Ishihara, MD (Inactive)

## 2016-04-23 NOTE — ED Triage Notes (Signed)
[facility], pt states lower back and hip pain for a couple weeks, states he fell yesterday, lower extremity swelling and redness noted upon arrival, pt also states foul smelling urine, pt awake and alert upon arrival

## 2016-04-24 ENCOUNTER — Inpatient Hospital Stay
Admit: 2016-04-24 | Discharge: 2016-04-24 | Disposition: A | Discharge status: Home / Self Care | Payer: Medicare Other | Attending: Internal Medicine | Admitting: Internal Medicine

## 2016-04-24 LAB — URINE CULTURE: CULTURE: NO GROWTH

## 2016-04-24 LAB — CBC
HCT: 38.9 % — ABNORMAL LOW (ref 40.0–52.0)
HEMOGLOBIN: 13.2 g/dL (ref 13.0–18.0)
MCH: 29.3 pg (ref 26.0–34.0)
MCHC: 33.8 g/dL (ref 32.0–36.0)
MCV: 86.6 fL (ref 80.0–100.0)
Platelets: 188 10*3/uL (ref 150–440)
RBC: 4.5 MIL/uL (ref 4.40–5.90)
RDW: 15.3 % — AB (ref 11.5–14.5)
WBC: 7.4 10*3/uL (ref 3.8–10.6)

## 2016-04-24 LAB — BASIC METABOLIC PANEL
ANION GAP: 7 (ref 5–15)
BUN: 25 mg/dL — ABNORMAL HIGH (ref 6–20)
CHLORIDE: 107 mmol/L (ref 101–111)
CO2: 27 mmol/L (ref 22–32)
Calcium: 8.4 mg/dL — ABNORMAL LOW (ref 8.9–10.3)
Creatinine, Ser: 1.66 mg/dL — ABNORMAL HIGH (ref 0.61–1.24)
GFR calc Af Amer: 46 mL/min — ABNORMAL LOW (ref >60)
GFR calc non Af Amer: 39 mL/min — ABNORMAL LOW (ref >60)
GLUCOSE: 99 mg/dL (ref 65–99)
POTASSIUM: 3.5 mmol/L (ref 3.5–5.1)
Sodium: 141 mmol/L (ref 135–145)

## 2016-04-24 MED ORDER — FINASTERIDE 5 MG PO TABS
5.0000 mg | ORAL_TABLET | Freq: Every day | ORAL | Status: DC
  Administered 2016-04-24 – 2016-04-26 (×3): 5 mg via ORAL
  Filled 2016-04-24 (×3): qty 1

## 2016-04-24 NOTE — Progress Notes (Signed)
Initial Nutrition Assessment  DOCUMENTATION CODES:   Obesity unspecified  INTERVENTION:  -Monitor intake and cater to pt preferences within diet restrictions    NUTRITION DIAGNOSIS:   Inadequate oral intake related to social / environmental circumstances as evidenced by per patient/family report.    GOAL:   Patient will meet greater than or equal to 90% of their needs    MONITOR:   PO intake, Labs, Weight trends  REASON FOR ASSESSMENT:   Malnutrition Screening Tool    ASSESSMENT:      Pt admitted with weakness, fall, UTI, ARF. Pt with past medical history of bipolar disorder, CHF, COPD, GERD, HTN  Noted ate 100% of breakfast this am and lunch. Tolerated well. Reports for the past 3 weeks have had a poor appetite before admission.  Reports has talked with mother in the last 30 years and she called him and they got in an argument which then effected his appetite.    Medications reviewed: Vit D, colace, KCL Labs reviewed: BUN 25, creatinine 1.66  Diet Order:  Diet Heart Room service appropriate? Yes; Fluid consistency: Thin  Skin:  Reviewed, no issues  Last BM:  9/16  Height:   Ht Readings from Last 1 Encounters:  04/23/16 5\' 10"  (1.778 m)    Weight: Per chart 9% wt loss in the past year noted, not significant  Pt reports that he lost a large amount of wt in 6 weeks but was trying to loose the weight.    Wt Readings from Last 1 Encounters:  04/23/16 267 lb (121.1 kg)    Ideal Body Weight:     BMI:  Body mass index is 38.31 kg/m.  Estimated Nutritional Needs:   Kcal:  1900-2400 kcals/d  Protein:  90-105 g/d  Fluid:  >/= 1.9 L/d  EDUCATION NEEDS:   No education needs identified at this time  [name] B. Freida BusmanAllen, RD, LDN [phone] (pager) Weekend/On-Call pager [phone])

## 2016-04-24 NOTE — Progress Notes (Signed)
Notified MD of diastolic BP in 50s. MD advised to hold terazosin and carvedilol. Will hold medications and continue to monitor patient closely.

## 2016-04-24 NOTE — Progress Notes (Signed)
Patient ID: [name], male   DOB: 01/16/1941, 75 y.o.   MRN: [id number]  [facility] PROGRESS NOTE  [name] WUJ:[id number] DOB: 01/16/1941 DOA: 04/23/2016 PCP: Lorenda Ishiharaupashree Varadarajan, MD (Inactive)  HPI/Subjective: Patient presented after a fall with hip and back pain. Patient complains of leg swelling and heaviness. Occasional chest pain. Occasional difficulty getting all of his urine out.  Objective: Vitals:   04/24/16 0448 04/24/16 1134  BP: 131/67 (!) 118/54  Pulse: 72 77  Resp: 20 20  Temp: 98.1 F (36.7 C) 98.3 F (36.8 C)    Filed Weights   04/23/16 1308 04/23/16 1746  Weight: 134.3 kg (296 lb) 121.1 kg (267 lb)    ROS: Review of Systems  Constitutional: Negative for chills and fever.  Eyes: Negative for blurred vision.  Respiratory: Negative for cough and shortness of breath.   Cardiovascular: Positive for chest pain.  Gastrointestinal: Negative for abdominal pain, constipation, diarrhea, nausea and vomiting.  Genitourinary: Positive for dysuria.  Musculoskeletal: Negative for joint pain.  Neurological: Negative for dizziness and headaches.   Exam: Physical Exam  Constitutional: He is oriented to person, place, and time.  HENT:  Nose: No mucosal edema.  Mouth/Throat: No oropharyngeal exudate or posterior oropharyngeal edema.  Eyes: Conjunctivae, EOM and lids are normal. Pupils are equal, round, and reactive to light.  Neck: No JVD present. Carotid bruit is not present. No edema present. No thyroid mass and no thyromegaly present.  Cardiovascular: S1 normal and S2 normal.  Exam reveals no gallop.   Murmur heard.  Systolic murmur is present with a grade of 4/6  Pulses:      Dorsalis pedis pulses are 2+ on the right side, and 2+ on the left side.  Respiratory: No respiratory distress. He has decreased breath sounds in the right lower field and the left lower field. He has no wheezes. He has no rhonchi. He has no rales.  GI: Soft. Bowel sounds  are normal. There is no tenderness.  Musculoskeletal:       Right ankle: He exhibits swelling.       Left ankle: He exhibits swelling.  Lymphadenopathy:    He has no cervical adenopathy.  Neurological: He is alert and oriented to person, place, and time. No cranial nerve deficit.  Skin: Skin is warm. Nails show no clubbing.  Chronic lower extremity erythema. Also has areas of thickened skin bilateral lower extremities.  Psychiatric: He has a normal mood and affect.      Data Reviewed: Basic Metabolic Panel:  Recent Labs Lab 04/23/16 1315 04/24/16 0603  NA 140 141  K 3.4* 3.5  CL 106 107  CO2 24 27  GLUCOSE 141* 99  BUN 30* 25*  CREATININE 2.64* 1.66*  CALCIUM 8.9 8.4*   Liver Function Tests:  Recent Labs Lab 04/23/16 1315  AST 19  ALT 10*  ALKPHOS 98  BILITOT 0.7  PROT 7.3  ALBUMIN 3.6    Recent Labs Lab 04/23/16 1315  LIPASE 21   CBC:  Recent Labs Lab 04/23/16 1315 04/24/16 0603  WBC 9.3 7.4  NEUTROABS 6.8*  --   HGB 12.9* 13.2  HCT 37.8* 38.9*  MCV 85.7 86.6  PLT 205 188   Cardiac Enzymes:  Recent Labs Lab 04/23/16 1315  TROPONINI 0.03*     Recent Results (from the past 240 hour(s))  MRSA PCR Screening     Status: None   Collection Time: 04/23/16  6:21 PM  Result Value Ref Range Status  MRSA by PCR NEGATIVE NEGATIVE Final    Comment:        The GeneXpert MRSA Assay (FDA approved for NASAL specimens only), is one component of a comprehensive MRSA colonization surveillance program. It is not intended to diagnose MRSA infection nor to guide or monitor treatment for MRSA infections.      Studies: US Venous Img Lower Bilateral  Result Date: 04/23/2016 CLINICAL DATA:  Bilateral calf pain and swelling for 2 weeks. EXAM: BILATERAL LOWER EXTREMITY VENOUS DOPPLER ULTRASOUND TECHNIQUE: Gray-scale sonography with graded compression, as well as color Doppler and duplex ultrasound were performed to evaluate the lower extremity deep  venous systems from the level of the common femoral vein and including the common femoral, femoral, profunda femoral, popliteal and calf veins including the posterior tibial, peroneal and gastrocnemius veins when visible. The superficial great saphenous vein was also interrogated. Spectral Doppler was utilized to evaluate flow at rest and with distal augmentation maneuvers in the common femoral, femoral and popliteal veins. COMPARISON:  None. FINDINGS: RIGHT LOWER EXTREMITY Common Femoral Vein: No evidence of thrombus. Normal compressibility, respiratory phasicity and response to augmentation. Saphenofemoral Junction: No evidence of thrombus. Normal compressibility and flow on color Doppler imaging. Profunda Femoral Vein: No evidence of thrombus. Normal compressibility and flow on color Doppler imaging. Femoral Vein: No evidence of thrombus. Normal compressibility, respiratory phasicity and response to augmentation. Popliteal Vein: No evidence of thrombus. Normal compressibility, respiratory phasicity and response to augmentation. Calf Veins: Visualized right deep calf veins are patent without thrombus. LEFT LOWER EXTREMITY Common Femoral Vein: No evidence of thrombus. Normal compressibility, respiratory phasicity and response to augmentation. Saphenofemoral Junction: No evidence of thrombus. Normal compressibility and flow on color Doppler imaging. Profunda Femoral Vein: No evidence of thrombus. Normal compressibility and flow on color Doppler imaging. Femoral Vein: No evidence of thrombus. Normal compressibility, respiratory phasicity and response to augmentation. Popliteal Vein: No evidence of thrombus. Normal compressibility, respiratory phasicity and response to augmentation. Calf Veins: Visualized left deep calf veins are patent without thrombus. IMPRESSION: No evidence of deep venous thrombosis in the lower extremities. Electronically Signed   By: Richarda Overlie M.D.   On: 04/23/2016 15:49    Scheduled  Meds: . amLODipine  5 mg Oral Daily  . ARIPiprazole  20 mg Oral Daily  . aspirin EC  81 mg Oral Daily  . buPROPion  300 mg Oral Daily  . carvedilol  25 mg Oral BID  . cefTRIAXone (ROCEPHIN)  IV  1 g Intravenous Q24H  . cholecalciferol  1,000 Units Oral Daily  . clotrimazole  1 application Topical BID  . docusate sodium  100 mg Oral BID  . enoxaparin (LOVENOX) injection  40 mg Subcutaneous Q24H  . finasteride  5 mg Oral Daily  . pantoprazole  40 mg Oral Daily  . PHENobarbital  129.6 mg Oral QODAY  . potassium chloride  10 mEq Oral Daily  . pravastatin  40 mg Oral Daily  . terazosin  5 mg Oral QHS   Assessment/Plan:  1. Acute kidney injury likely secondary to over diuresis. Hold Lasix. Continue to watch creatinine on a daily basis. Creatinine improved from 2.64 to 1.66. 2. Acute cystitis without hematuria. Continue Rocephin. Urine culture still pending 3. BPH with difficulty getting his urine out. Continue Glynis Smiles. Add finasteride. 4. Heart murmur. Obtain echocardiogram. 5. Lower extremity edema. Wound care consultation for Unna boot placement with lower extremity edema 6. Seizure history on phenobarbital 7. Essential hypertension on Norvasc 8. Schizophrenia on  Abilify 9. Hyperlipidemia unspecified on pravastatin 10. GERD on Protonix  Code Status:     Code Status Orders        Start     Ordered   04/23/16 1622  Full code  Continuous     04/23/16 1622    Code Status History    Date Active Date Inactive Code Status Order ID Comments User Context   04/23/2016  4:22 PM 04/24/2016 12:58 PM Full Code [id number]  Auburn Bilberry, MD ED   02/21/2015  3:37 PM 02/24/2015  4:42 PM Full Code [id number]  Shaune Pollack, MD Inpatient     Disposition Plan: Potentially back to his facility on Monday  Antibiotics:  Rocephin  Time spent: 28 minutes  Alford Highland  [employer]

## 2016-04-24 NOTE — Clinical Social Work Note (Signed)
Clinical Social Work Assessment  Patient Details  Name: [name] DarbyJerry L Pellman MRN: [id number] Date of Birth: [birth year]  Date of referral:  04/24/16               Reason for consult:  Facility Placement                Permission sought to share information with:  Facility Industrial/product designerContact Representative Permission granted to share information::  Yes, Verbal Permission Granted  Name::     Ardyth GalQueen Love  Agency::  [facility]  Relationship::     Contact Information:  [phone]  Housing/Transportation Living arrangements for the past 2 months:  Group Home Source of Information:  Patient Patient Interpreter Needed:  None Criminal Activity/Legal Involvement Pertinent to Current Situation/Hospitalization:  No - Comment as needed Significant Relationships:  Mental Health Provider, [employer] Support Lives with:  Facility Resident Do you feel safe going back to the place where you live?  Yes Need for family participation in patient care:  No (Coment)  Care giving concerns:  Patient admitted from a group home  Social Worker assessment / plan:  Patient was resting comfortably when CSW visited at bedside. CSW explained her role and introduced self.  Patient at baseline uses a non-rolling walker. He is independent in all ADLs and is continent of both bladder and bowel. [facility] and gave verbal permission to consult facility.  Patient to [facility] with [facility] when stable.    Employment status:  Retired Health and safety inspectornsurance information:  Armed forces operational officerMedicare, Medicaid [place] Recommendations:  Home with Home Health Information / Referral to community resources:     Patient/Family's Response to care: Patient was pleasant and cooperative. Patient thanked CSW for assistance.  Patient/Family's Understanding of and Emotional Response to Diagnosis, Current Treatment, and Prognosis:  Patient was able to verbalize in his own terms what the dc plan is.  Emotional  Assessment Appearance:  Appears stated age Attitude/Demeanor/Rapport:   (Pleasant) Affect (typically observed):  Appropriate, Pleasant Orientation:  Oriented to Self, Oriented to Place, Oriented to  Time Alcohol / Substance use:  Never Used Psych involvement (Current and /or in the community):  No (Comment)  Discharge Needs  Concerns to be addressed:  Care Coordination Readmission within the last 30 days:  No Current discharge risk:  None Barriers to Discharge:  Continued Medical Work up   [employer] CorporationKaren M Kenny Stern, LCSW 04/24/2016, 4:50 PM

## 2016-04-24 NOTE — Plan of Care (Signed)
Problem: Safety: Goal: Ability to remain free from injury will improve Outcome: Progressing Hourly rounding on patient. Bed alarms utilized, educated patient on importance of using call bell or phones for assistance if needs to get up.

## 2016-04-24 NOTE — [place] FL2 (Addendum)
MEDICAID FL2 LEVEL OF CARE SCREENING TOOL     IDENTIFICATION  Patient Name: [name] Birthdate: 04/21/1941 Sex: male Admission Date (Current Location): 04/23/2016  [place]   Facility and Address:  [facility], [address], [place], [place] [address]      Provider Number: [id number]  Attending Physician Name and Address:  Alford Highlandichard Wieting, MD  Relative Name and Phone Number:       Current Level of Care: Hospital [facility] Prior Approval Number:    Date Approved/Denied:   PASRR Number:  [id number]  Discharge Plan: Other (Comment) ([facility]: [facility])    Current Diagnoses: Patient Active Problem List   Diagnosis Date Noted  . UTI (lower urinary tract infection) 04/23/2016  . 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 Pneumonia 02/21/2015  . ARF (acute renal failure) (HCC) 02/21/2015  . Hypokalemia 02/21/2015  . Leukocytosis 02/21/2015    Orientation RESPIRATION BLADDER Height & Weight     Self, Time, Situation  Normal Continent Weight: 267 lb (121.1 kg) Height:  5\' 10"  (177.8 cm)  BEHAVIORAL SYMPTOMS/MOOD NEUROLOGICAL BOWEL NUTRITION STATUS      Continent    AMBULATORY STATUS COMMUNICATION OF NEEDS Skin   Independent Verbally Normal                       Personal Care Assistance Level of Assistance  Bathing, Feeding, Dressing Bathing Assistance: Independent Feeding assistance: Independent Dressing Assistance: Independent     Functional Limitations Info             SPECIAL CARE FACTORS FREQUENCY  PT (By licensed PT)     PT Frequency: Home Health PT TBD              Contractures Contractures Info: Present    Additional Factors Info  Allergies   Allergies Info: Penicillins           Current Discharge Medication List        START taking these medications   Details   finasteride (PROSCAR) 5 MG tablet Take 1 tablet (5 mg total) by mouth daily. Qty: 30 tablet, Refills: 0    polyethylene glycol (MIRALAX / GLYCOLAX) packet Take 17 g by mouth daily as needed for moderate constipation. Qty: 30 each, Refills: 0          CONTINUE these medications which have CHANGED   Details  acetaminophen (TYLENOL) 325 MG tablet Take 2 tablets (650 mg total) by mouth every 6 (six) hours as needed. Qty: 90 tablet, Refills: 0    amLODipine (NORVASC) 2.5 MG tablet Take 1 tablet (2.5 mg total) by mouth daily. Qty: 30 tablet, Refills: 0    ARIPiprazole (ABILIFY) 20 MG tablet Take 1 tablet (20 mg total) by mouth daily. Qty: 30 tablet, Refills: 0    aspirin EC 81 MG EC tablet Take 1 tablet (81 mg total) by mouth daily. Qty: 30 tablet, Refills: 0    buPROPion (BUDEPRION XL) 300 MG 24 hr tablet Take 1 tablet (300 mg total) by mouth daily. Qty: 30 tablet, Refills: 0    carvedilol (COREG) 25 MG tablet Take 1 tablet (25 mg total) by mouth 2 (two) times daily. Qty: 60 tablet, Refills: 0    Cholecalciferol (VITAMIN D-1000 MAX ST) 1000 units tablet Take 1 tablet (1,000 Units total) by mouth daily. Qty: 30 tablet, Refills: 0  furosemide (LASIX) 40 MG tablet Take 1 tablet (40 mg total) by mouth daily. Qty: 30 tablet, Refills: 0    omeprazole (PRILOSEC) 20 MG capsule Take 2 capsules (40 mg total) by mouth daily. Qty: 30 capsule, Refills: 0    PHENobarbital (LUMINAL) 64.8 MG tablet Take 2 tablets (129.6 mg total) by mouth every other day. Qty: 30 tablet, Refills: 0    potassium chloride (K-DUR,KLOR-CON) 10 MEQ tablet Take 1 tablet (10 mEq total) by mouth daily. Qty: 30 tablet, Refills: 0    pravastatin (PRAVACHOL) 40 MG tablet Take 1 tablet (40 mg total) by mouth daily. Qty: 30 tablet, Refills: 0    terazosin (HYTRIN) 5 MG capsule Take 1 capsule (5 mg total) by mouth daily. Qty: 30 capsule, Refills: 0         STOP taking these medications      aspirin 81 MG chewable tablet      cloNIDine (CATAPRES) 0.2 MG tablet      fluconazole (DIFLUCAN) 100 MG tablet      clotrimazole (LOTRIMIN) 1 % cream      docusate sodium (COLACE) 100 MG capsule      potassium chloride (KLOR-CON 10) 10 MEQ tablet           Relevant Imaging Results:  Relevant Lab Results:   Additional Information  SS [id number]  Georgetta Haber

## 2016-04-24 NOTE — Evaluation (Signed)
Physical Therapy Evaluation Patient Details Name: [name] MRN: [id number] DOB: 11-20-1940 Today's Date: 04/24/2016   History of Present Illness  75 y.o. male with a known history of Bipolar 1 disorder, chronic lower extremity swelling, congestive heart failure, COPD, previous history of DVT, GERD, hyperlipidemia, hypertension, schizophrenia and seizure who [facility]. Who states that he has not been feeling well for the past 1 week. He has been very weak and has had generalized weakness.  Pt had a fall the day prior to admission.   Clinical Impression  Pt had issues with bowel and bladder incontinence t/o the PT exam, but ultimately was able to ambulate ~60 ft with the walker and did not need excessive assist with getting to EOB.  He has significant b/l LE swelling with some weeping and reports some back pain (associated with recent fall?).  Overall pt did well and should be able to return to his group home with HHPT.    Follow Up Recommendations Home health PT    Equipment Recommendations   (has walker at home)    Recommendations for Other Services       Precautions / Restrictions Precautions Precautions: Fall Restrictions Weight Bearing Restrictions: No      Mobility  Bed Mobility Overal bed mobility: Needs Assistance Bed Mobility: Supine to Sit     Supine to sit: Min assist     General bed mobility comments: Pt unable to get LEs fully around to EOB and to lift trunk upright  Transfers Overall transfer level: Needs assistance Equipment used: Rolling walker (2 wheeled) Transfers: Sit to/from Stand Sit to Stand: Min guard;Min assist         General transfer comment: Pt shows good effort getting to standing, needs cues for hand placement and some light assist to get to standing but overall did well with no LOBs.  Ambulation/Gait Ambulation/Gait assistance: Min guard Ambulation Distance (Feet): 60 Feet Assistive device:  Rolling walker (2 wheeled)       General Gait Details: Pt with hunched posture and reliance on the walker but he was able to ambulate with consistent and relatively confident speed.  Pt fatigued after the effort, and reports he does not feel like he is at his baseline but overall he was safe and relatively efficient with the effort.   Stairs            Wheelchair Mobility    Modified Rankin (Stroke Patients Only)       Balance Overall balance assessment: Needs assistance   Sitting balance-Leahy Scale: Good       Standing balance-Leahy Scale: Fair Standing balance comment: definite need of UE assist to maintain standing baalnce                             Pertinent Vitals/Pain Pain Assessment: 0-10 Pain Score: 6  Pain Location: back, adbomen, LEs    Home Living Family/patient expects to be discharged to:: Group home                      Prior Function Level of Independence: Independent with assistive device(s)         Comments: Per pt he is able to dress, walk to dining room, etc     Hand Dominance        Extremity/Trunk Assessment   Upper Extremity Assessment: Generalized weakness (Grossly 3+/5 in available range)  Lower Extremity Assessment: Generalized weakness (b/l LEs grossly 3+/5, swelling/weeping b/l)         Communication   Communication: No difficulties  Cognition Arousal/Alertness: Awake/alert Behavior During Therapy: WFL for tasks assessed/performed Overall Cognitive Status:  (pt appears to near baseline, minimal confusion)                      General Comments      Exercises     Assessment/Plan    PT Assessment Patient needs continued PT services  PT Problem List Decreased strength;Decreased activity tolerance;Decreased balance;Decreased mobility;Decreased knowledge of use of DME;Decreased safety awareness          PT Treatment Interventions DME instruction;Gait training;Functional  mobility training;Therapeutic activities;Therapeutic exercise;Balance training    PT Goals (Current goals can be found in the Care Plan section)  Acute Rehab PT Goals Patient Stated Goal: go home PT Goal Formulation: With patient Time For Goal Achievement: 05/08/16 Potential to Achieve Goals: Fair    Frequency Min 2X/week   Barriers to discharge        Co-evaluation               End of Session Equipment Utilized During Treatment: Gait belt Activity Tolerance: Patient limited by fatigue;Patient tolerated treatment well Patient left: with call bell/phone within reach;with bed alarm set           Time: 0801-0820 PT Time Calculation (min) (ACUTE ONLY): 19 min   Charges:   PT Evaluation $PT Eval Low Complexity: 1 Procedure     PT G Codes:        [name], DPT 04/24/2016, 11:01 AM

## 2016-04-25 LAB — ECHOCARDIOGRAM COMPLETE
HEIGHTINCHES: 70 in
Weight: 4272 oz

## 2016-04-25 LAB — BASIC METABOLIC PANEL
Anion gap: 6 (ref 5–15)
BUN: 17 mg/dL (ref 6–20)
CALCIUM: 8.3 mg/dL — AB (ref 8.9–10.3)
CHLORIDE: 109 mmol/L (ref 101–111)
CO2: 28 mmol/L (ref 22–32)
CREATININE: 1.1 mg/dL (ref 0.61–1.24)
GFR calc non Af Amer: >60 mL/min (ref >60)
Glucose, Bld: 108 mg/dL — ABNORMAL HIGH (ref 65–99)
Potassium: 3.4 mmol/L — ABNORMAL LOW (ref 3.5–5.1)
SODIUM: 143 mmol/L (ref 135–145)

## 2016-04-25 MED ORDER — FUROSEMIDE 40 MG PO TABS
40.0000 mg | ORAL_TABLET | Freq: Every day | ORAL | Status: DC
  Administered 2016-04-25 – 2016-04-26 (×2): 40 mg via ORAL
  Filled 2016-04-25 (×2): qty 1

## 2016-04-25 MED ORDER — LACTULOSE 10 GM/15ML PO SOLN
30.0000 g | Freq: Once | ORAL | Status: AC
  Administered 2016-04-25: 30 g via ORAL
  Filled 2016-04-25: qty 60
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
## 2016-04-25 NOTE — Progress Notes (Signed)
Patient ID: [name], male   DOB: Dec 14, 1940, 75 y.o.   MRN: [id number]  [facility] PROGRESS NOTE  MAYES SANGIOVANNI WUJ:[id number] DOB: 1940-11-01 DOA: 04/23/2016 PCP: Lorenda Ishihara, MD (Inactive)  HPI/Subjective: Patient now brought up complaints about the facility that he is at. He feels okay. Eating well.  Objective: Vitals:   04/24/16 2028 04/25/16 0445  BP: (!) 147/78 (!) 144/83  Pulse: 79 78  Resp: 17 18  Temp: 98.7 F (37.1 C) 98.5 F (36.9 C)    Filed Weights   04/23/16 1308 04/23/16 1746  Weight: 134.3 kg (296 lb) 121.1 kg (267 lb)    ROS: Review of Systems  Constitutional: Negative for chills and fever.  Eyes: Negative for blurred vision.  Respiratory: Negative for cough and shortness of breath.   Cardiovascular: Negative for chest pain.  Gastrointestinal: Negative for abdominal pain, constipation, diarrhea, nausea and vomiting.  Genitourinary: Negative for dysuria.  Musculoskeletal: Negative for joint pain.  Neurological: Negative for dizziness and headaches.   Exam: Physical Exam  Constitutional: He is oriented to person, place, and time.  HENT:  Nose: No mucosal edema.  Mouth/Throat: No oropharyngeal exudate or posterior oropharyngeal edema.  Eyes: Conjunctivae, EOM and lids are normal. Pupils are equal, round, and reactive to light.  Neck: No JVD present. Carotid bruit is not present. No edema present. No thyroid mass and no thyromegaly present.  Cardiovascular: S1 normal and S2 normal.  Exam reveals no gallop.   Murmur heard.  Systolic murmur is present with a grade of 4/6  Pulses:      Dorsalis pedis pulses are 2+ on the right side, and 2+ on the left side.  Respiratory: No respiratory distress. He has decreased breath sounds in the right lower field and the left lower field. He has no wheezes. He has no rhonchi. He has no rales.  GI: Soft. Bowel sounds are normal. There is no tenderness.  Musculoskeletal:       Right ankle: He  exhibits swelling.       Left ankle: He exhibits swelling.  Lymphadenopathy:    He has no cervical adenopathy.  Neurological: He is alert and oriented to person, place, and time. No cranial nerve deficit.  Skin: Skin is warm. Nails show no clubbing.  Chronic lower extremity erythema. Also has areas of thickened skin bilateral lower extremities.  Psychiatric: He has a normal mood and affect.      Data Reviewed: Basic Metabolic Panel:  Recent Labs Lab 04/23/16 1315 04/24/16 0603 04/25/16 0519  NA 140 141 143  K 3.4* 3.5 3.4*  CL 106 107 109  CO2 24 27 28   GLUCOSE 141* 99 108*  BUN 30* 25* 17  CREATININE 2.64* 1.66* 1.10  CALCIUM 8.9 8.4* 8.3*   Liver Function Tests:  Recent Labs Lab 04/23/16 1315  AST 19  ALT 10*  ALKPHOS 98  BILITOT 0.7  PROT 7.3  ALBUMIN 3.6    Recent Labs Lab 04/23/16 1315  LIPASE 21   CBC:  Recent Labs Lab 04/23/16 1315 04/24/16 0603  WBC 9.3 7.4  NEUTROABS 6.8*  --   HGB 12.9* 13.2  HCT 37.8* 38.9*  MCV 85.7 86.6  PLT 205 188   Cardiac Enzymes:  Recent Labs Lab 04/23/16 1315  TROPONINI 0.03*     Recent Results (from the past 240 hour(s))  Urine culture     Status: None   Collection Time: 04/23/16  2:08 PM  Result Value Ref Range Status  Specimen Description URINE, RANDOM  Final   Special Requests NONE  Final   Culture NO GROWTH Performed at [facility]   Final   Report Status 04/24/2016 FINAL  Final  MRSA PCR Screening     Status: None   Collection Time: 04/23/16  6:21 PM  Result Value Ref Range Status   MRSA by PCR NEGATIVE NEGATIVE Final    Comment:        The GeneXpert MRSA Assay (FDA approved for NASAL specimens only), is one component of a comprehensive MRSA colonization surveillance program. It is not intended to diagnose MRSA infection nor to guide or monitor treatment for MRSA infections.      Studies: US Venous Img Lower Bilateral  Result Date: 04/23/2016 CLINICAL DATA:  Bilateral  calf pain and swelling for 2 weeks. EXAM: BILATERAL LOWER EXTREMITY VENOUS DOPPLER ULTRASOUND TECHNIQUE: Gray-scale sonography with graded compression, as well as color Doppler and duplex ultrasound were performed to evaluate the lower extremity deep venous systems from the level of the common femoral vein and including the common femoral, femoral, profunda femoral, popliteal and calf veins including the posterior tibial, peroneal and gastrocnemius veins when visible. The superficial great saphenous vein was also interrogated. Spectral Doppler was utilized to evaluate flow at rest and with distal augmentation maneuvers in the common femoral, femoral and popliteal veins. COMPARISON:  None. FINDINGS: RIGHT LOWER EXTREMITY Common Femoral Vein: No evidence of thrombus. Normal compressibility, respiratory phasicity and response to augmentation. Saphenofemoral Junction: No evidence of thrombus. Normal compressibility and flow on color Doppler imaging. Profunda Femoral Vein: No evidence of thrombus. Normal compressibility and flow on color Doppler imaging. Femoral Vein: No evidence of thrombus. Normal compressibility, respiratory phasicity and response to augmentation. Popliteal Vein: No evidence of thrombus. Normal compressibility, respiratory phasicity and response to augmentation. Calf Veins: Visualized right deep calf veins are patent without thrombus. LEFT LOWER EXTREMITY Common Femoral Vein: No evidence of thrombus. Normal compressibility, respiratory phasicity and response to augmentation. Saphenofemoral Junction: No evidence of thrombus. Normal compressibility and flow on color Doppler imaging. Profunda Femoral Vein: No evidence of thrombus. Normal compressibility and flow on color Doppler imaging. Femoral Vein: No evidence of thrombus. Normal compressibility, respiratory phasicity and response to augmentation. Popliteal Vein: No evidence of thrombus. Normal compressibility, respiratory phasicity and response to  augmentation. Calf Veins: Visualized left deep calf veins are patent without thrombus. IMPRESSION: No evidence of deep venous thrombosis in the lower extremities. Electronically Signed   By: Richarda Overlie M.D.   On: 04/23/2016 15:49    Scheduled Meds: . amLODipine  5 mg Oral Daily  . ARIPiprazole  20 mg Oral Daily  . aspirin EC  81 mg Oral Daily  . buPROPion  300 mg Oral Daily  . carvedilol  25 mg Oral BID  . cefTRIAXone (ROCEPHIN)  IV  1 g Intravenous Q24H  . cholecalciferol  1,000 Units Oral Daily  . clotrimazole  1 application Topical BID  . docusate sodium  100 mg Oral BID  . enoxaparin (LOVENOX) injection  40 mg Subcutaneous Q24H  . finasteride  5 mg Oral Daily  . furosemide  40 mg Oral Daily  . lactulose  30 g Oral Once  . pantoprazole  40 mg Oral Daily  . PHENobarbital  129.6 mg Oral QODAY  . potassium chloride  10 mEq Oral Daily  . pravastatin  40 mg Oral Daily  . terazosin  5 mg Oral QHS   Assessment/Plan:  1. Acute kidney injury likely secondary  to over diuresis. Creatinine normalized 2. Acute cystitis without hematuria. Continue Rocephin today and [name] be discontinued tomorrow. Urine culture negative. 3. BPH with difficulty getting his urine out. Continue Terazosin. Add finasteride. 4. Heart murmur. Awaiting reading on echocardiogram 5. Lower extremity edema. Wound care consultation for Unna boot placement with lower extremity edema. Restart lower dose Lasix 40 mg daily 6. Seizure history on phenobarbital 7. Essential hypertension on Norvasc 8. Schizophrenia on Abilify 9. Hyperlipidemia unspecified on pravastatin 10. GERD on Protonix 11. Constipation -give 1 dose of lactulose 12. Social worker looking into placement options  Code Status:     Code Status Orders        Start     Ordered   04/23/16 1622  Full code  Continuous     04/23/16 1622    Code Status History    Date Active Date Inactive Code Status Order ID Comments User Context   04/23/2016  4:22 PM  04/24/2016 12:58 PM Full Code [id number]  Auburn BilberryShreyang Patel, MD ED   02/21/2015  3:37 PM 02/24/2015  4:42 PM Full Code [id number]  Shaune PollackQing Chen, MD Inpatient     Disposition Plan: Social worker needs to look into placement options. I will be able to get the patient out once there is an availability  Antibiotics:  Rocephin  Time spent: 24 minutes  Alford HighlandWIETING, Carthel Castille  [employer] [facility]

## 2016-04-26 MED ORDER — PHENOBARBITAL 64.8 MG PO TABS: 129.6000 mg | ORAL_TABLET | ORAL | 0 refills | Status: DC

## 2016-04-26 MED ORDER — POTASSIUM CHLORIDE CRYS ER 10 MEQ PO TBCR: 10.0000 meq | EXTENDED_RELEASE_TABLET | Freq: Every day | ORAL | 0 refills | Status: DC

## 2016-04-26 MED ORDER — AMLODIPINE BESYLATE 2.5 MG PO TABS: 2.5000 mg | ORAL_TABLET | Freq: Every day | ORAL | 0 refills | Status: DC

## 2016-04-26 MED ORDER — ASPIRIN 81 MG PO TBEC: 81.0000 mg | DELAYED_RELEASE_TABLET | Freq: Every day | ORAL | 0 refills | Status: DC

## 2016-04-26 MED ORDER — OMEPRAZOLE 20 MG PO CPDR: 40.0000 mg | DELAYED_RELEASE_CAPSULE | Freq: Every day | ORAL | 0 refills | Status: DC

## 2016-04-26 MED ORDER — TUBERCULIN PPD 5 UNIT/0.1ML ID SOLN
5.0000 [IU] | Freq: Once | INTRADERMAL | Status: DC
Start: 2016-04-26 — End: 2016-04-26
  Administered 2016-04-26: 5 [IU] via INTRADERMAL
  Filled 2016-04-26: qty 0.1

## 2016-04-26 MED ORDER — CARVEDILOL 25 MG PO TABS: 25.0000 mg | ORAL_TABLET | Freq: Two times a day (BID) | ORAL | 0 refills | Status: DC

## 2016-04-26 MED ORDER — PRAVASTATIN SODIUM 40 MG PO TABS: 40.0000 mg | ORAL_TABLET | Freq: Every day | ORAL | 0 refills | Status: DC

## 2016-04-26 MED ORDER — BUPROPION HCL ER (XL) 300 MG PO TB24: 300.0000 mg | ORAL_TABLET | Freq: Every day | ORAL | 0 refills | Status: DC

## 2016-04-26 MED ORDER — FINASTERIDE 5 MG PO TABS: 5.0000 mg | ORAL_TABLET | Freq: Every day | ORAL | 0 refills | Status: DC

## 2016-04-26 MED ORDER — TERAZOSIN HCL 5 MG PO CAPS: 5.0000 mg | ORAL_CAPSULE | Freq: Every day | ORAL | 0 refills | Status: DC

## 2016-04-26 MED ORDER — POLYETHYLENE GLYCOL 3350 17 G PO PACK: 17.0000 g | PACK | Freq: Every day | ORAL | Status: DC | PRN

## 2016-04-26 MED ORDER — POLYETHYLENE GLYCOL 3350 17 G PO PACK: 17.0000 g | PACK | Freq: Every day | ORAL | 0 refills | Status: DC | PRN

## 2016-04-26 MED ORDER — FUROSEMIDE 40 MG PO TABS: 40.0000 mg | ORAL_TABLET | Freq: Every day | ORAL | 0 refills | Status: DC

## 2016-04-26 MED ORDER — CHOLECALCIFEROL 25 MCG (1000 UT) PO TABS: 1000.0000 [IU] | ORAL_TABLET | Freq: Every day | ORAL | 0 refills | Status: DC

## 2016-04-26 MED ORDER — ACETAMINOPHEN 325 MG PO TABS: 650.0000 mg | ORAL_TABLET | Freq: Four times a day (QID) | ORAL | 0 refills | Status: DC | PRN

## 2016-04-26 MED ORDER — ARIPIPRAZOLE 20 MG PO TABS: 20.0000 mg | ORAL_TABLET | Freq: Every day | ORAL | 0 refills | Status: DC

## 2016-04-26 NOTE — Discharge Summary (Signed)
[facility] - [place] [facility]   PATIENT NAME: [name]    MR#:  [id number]  DATE OF BIRTH:  1941-03-30  DATE OF ADMISSION:  04/23/2016 ADMITTING PHYSICIAN: Auburn Bilberry, MD  DATE OF DISCHARGE: 04/26/2016  PRIMARY CARE PHYSICIAN: Lorenda Ishihara, MD (Inactive)    ADMISSION DIAGNOSIS:  Lower abdominal pain [R10.30] Generalized weakness [R53.1] Bilateral lower extremity edema [R60.0] Acute renal failure, unspecified acute renal failure type (HCC) [N17.9]  DISCHARGE DIAGNOSIS:  Active Problems:   UTI (lower urinary tract infection)   SECONDARY DIAGNOSIS:   Past Medical History:  Diagnosis Date  . Bipolar 1 disorder (HCC)   . CHF (congestive heart failure) (HCC)   . COPD (chronic obstructive pulmonary disease) (HCC)   . DVT (deep venous thrombosis) (HCC)   . GERD (gastroesophageal reflux disease)   . High cholesterol   . Hypertension   . Schizophrenia (HCC)   . Seizure [facility])     HOSPITAL COURSE:   1.  Acute kidney injury. This is secondary to over diuresis. Creatinine normalized. 2. Acute cystitis without hematuria. Patient finished Rocephin 3 days. Urine culture negative so antibiotic was discontinued. 3. BPH with difficulty getting his urine output continued care Zosyn and add finasteride. 4. Lower extremity edema and anasarca. Restart low-dose Lasix 40 mg daily. 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 5. Seizure history on phenobarbital 6. Essential hypertension on Norvasc 7. Schizophrenia on Abilify 8. Hyperlipidemia unspecified on pravastatin 9. GERD on Protonix 10. Patient complains of constipation a lot. He did have bowel movement while here. When necessary MiraLAX as outpatient 11. Weakness. Home health physical therapy  DISCHARGE CONDITIONS:   Satisfactory  CONSULTS OBTAINED:   None DRUG ALLERGIES:   Allergies  Allergen Reactions  . Penicillins     DISCHARGE  MEDICATIONS:   Current Discharge Medication List    START taking these medications   Details  finasteride (PROSCAR) 5 MG tablet Take 1 tablet (5 mg total) by mouth daily. Qty: 30 tablet, Refills: 0    polyethylene glycol (MIRALAX / GLYCOLAX) packet Take 17 g by mouth daily as needed for moderate constipation. Qty: 30 each, Refills: 0      CONTINUE these medications which have CHANGED   Details  acetaminophen (TYLENOL) 325 MG tablet Take 2 tablets (650 mg total) by mouth every 6 (six) hours as needed. Qty: 90 tablet, Refills: 0    amLODipine (NORVASC) 2.5 MG tablet Take 1 tablet (2.5 mg total) by mouth daily. Qty: 30 tablet, Refills: 0    ARIPiprazole (ABILIFY) 20 MG tablet Take 1 tablet (20 mg total) by mouth daily. Qty: 30 tablet, Refills: 0    aspirin EC 81 MG EC tablet Take 1 tablet (81 mg total) by mouth daily. Qty: 30 tablet, Refills: 0    buPROPion (BUDEPRION XL) 300 MG 24 hr tablet Take 1 tablet (300 mg total) by mouth daily. Qty: 30 tablet, Refills: 0    carvedilol (COREG) 25 MG tablet Take 1 tablet (25 mg total) by mouth 2 (two) times daily. Qty: 60 tablet, Refills: 0    Cholecalciferol (VITAMIN D-1000 MAX ST) 1000 units tablet Take 1 tablet (1,000 Units total) by mouth daily. Qty: 30 tablet, Refills: 0    furosemide (LASIX) 40 MG tablet Take 1 tablet (40 mg total) by mouth daily. Qty: 30 tablet, Refills: 0    omeprazole (PRILOSEC) 20 MG capsule Take 2 capsules (40 mg total) by mouth daily. Qty: 30 capsule, Refills: 0  PHENobarbital (LUMINAL) 64.8 MG tablet Take 2 tablets (129.6 mg total) by mouth every other day. Qty: 30 tablet, Refills: 0    potassium chloride (K-DUR,KLOR-CON) 10 MEQ tablet Take 1 tablet (10 mEq total) by mouth daily. Qty: 30 tablet, Refills: 0    pravastatin (PRAVACHOL) 40 MG tablet Take 1 tablet (40 mg total) by mouth daily. Qty: 30 tablet, Refills: 0    terazosin (HYTRIN) 5 MG capsule Take 1 capsule (5 mg total) by mouth daily. Qty:  30 capsule, Refills: 0      STOP taking these medications     aspirin 81 MG chewable tablet      cloNIDine (CATAPRES) 0.2 MG tablet      fluconazole (DIFLUCAN) 100 MG tablet      clotrimazole (LOTRIMIN) 1 % cream      docusate sodium (COLACE) 100 MG capsule      potassium chloride (KLOR-CON 10) 10 MEQ tablet          DISCHARGE INSTRUCTIONS:   Follow-up with PMD one week  If you experience worsening of your admission symptoms, develop shortness of breath, life threatening emergency, suicidal or homicidal thoughts you must seek medical attention immediately by calling 911 or calling your MD immediately  if symptoms less severe.  You Must read complete instructions/literature along with all the possible adverse reactions/side effects for all the Medicines you take and that have been prescribed to you. Take any new Medicines after you have completely understood and accept all the possible adverse reactions/side effects.   Please note  You were cared for by a hospitalist during your hospital stay. If you have any questions about your discharge medications or the care you received while you were in the hospital after you are discharged, you can call the unit and asked to speak with the hospitalist on call if the hospitalist that took care of you is not available. Once you are discharged, your primary care physician will handle any further medical issues. Please note that NO REFILLS for any discharge medications will be authorized once you are discharged, as it is imperative that you return to your primary care physician (or establish a relationship with a primary care physician if you do not have one) for your aftercare needs so that they can reassess your need for medications and monitor your lab values.    Today   CHIEF COMPLAINT:   Chief Complaint  Patient presents with  . Fall  . Back Pain  . Hip Pain    HISTORY OF PRESENT ILLNESS:  [name]  is a 75 y.o. male  presented with fall back pain and hip pain.   VITAL SIGNS:  Blood pressure (!) 141/77, pulse 90, temperature 98 F (36.7 C), temperature source Oral, resp. rate 13, height 5\' 10"  (1.778 m), weight 121.1 kg (267 lb), SpO2 97 %.    PHYSICAL EXAMINATION:  GENERAL:  75 y.o.-year-old patient lying in the bed with no acute distress.  EYES: Pupils equal, round, reactive to light and accommodation. No scleral icterus. Extraocular muscles intact.  HEENT: Head atraumatic, normocephalic. Oropharynx and nasopharynx clear.  NECK:  Supple, no jugular venous distention. No thyroid enlargement, no tenderness.  LUNGS: Normal breath sounds bilaterally, no wheezing, rales,rhonchi or crepitation. No use of accessory muscles of respiration.  CARDIOVASCULAR: S1, S2 normal. 4/6 systolic murmurs, no rubs, or gallops.  ABDOMEN: Soft, non-tender, non-distended. Bowel sounds present. No organomegaly or mass.  EXTREMITIES: 3+ pedal edema, no cyanosis, or clubbing.  NEUROLOGIC: Cranial nerves  II through XII are intact. Muscle strength 5/5 in all extremities. Sensation intact. Gait not checked.  PSYCHIATRIC: The patient is alert and oriented x 3.  SKIN: Chronic lower extremity skin discoloration and thickened skin.   DATA REVIEW:   CBC  Recent Labs Lab 04/24/16 0603  WBC 7.4  HGB 13.2  HCT 38.9*  PLT 188    Chemistries   Recent Labs Lab 04/23/16 1315  04/25/16 0519  NA 140  < > 143  K 3.4*  < > 3.4*  CL 106  < > 109  CO2 24  < > 28  GLUCOSE 141*  < > 108*  BUN 30*  < > 17  CREATININE 2.64*  < > 1.10  CALCIUM 8.9  < > 8.3*  AST 19  --   --   ALT 10*  --   --   ALKPHOS 98  --   --   BILITOT 0.7  --   --   < > = values in this interval not displayed.  Cardiac Enzymes  Recent Labs Lab 04/23/16 1315  TROPONINI 0.03*    Microbiology Results  Results for orders placed or performed during the hospital encounter of 04/23/16  Urine culture     Status: None   Collection Time: 04/23/16  2:08  PM  Result Value Ref Range Status   Specimen Description URINE, RANDOM  Final   Special Requests NONE  Final   Culture NO GROWTH Performed at [facility]   Final   Report Status 04/24/2016 FINAL  Final  MRSA PCR Screening     Status: None   Collection Time: 04/23/16  6:21 PM  Result Value Ref Range Status   MRSA by PCR NEGATIVE NEGATIVE Final    Comment:        The GeneXpert MRSA Assay (FDA approved for NASAL specimens only), is one component of a comprehensive MRSA colonization surveillance program. It is not intended to diagnose MRSA infection nor to guide or monitor treatment for MRSA infections.     Management plans discussed with the patient, And he is in agreement.  CODE STATUS:     Code Status Orders        Start     Ordered   04/23/16 1622  Full code  Continuous     04/23/16 1622    Code Status History    Date Active Date Inactive Code Status Order ID Comments User Context   04/23/2016  4:22 PM 04/24/2016 12:58 PM Full Code [id number]  Auburn Bilberry, MD ED   02/21/2015  3:37 PM 02/24/2015  4:42 PM Full Code [id number]  Shaune Pollack, MD Inpatient      TOTAL TIME TAKING CARE OF THIS PATIENT: 35 minutes.    Alford Highland M.D on 04/26/2016 at 12:30 PM  Between 7am to 6pm - Pager - [phone]  After 6pm go to [email or web address] - password [email or web address]  [facility] Office  [phone]  CC: Primary care physician; Lorenda Ishihara, MD (Inactive)

## 2016-04-26 NOTE — Care Management Note (Signed)
Case Management Note  Patient Details  Name: Cephas DarbyJerry L Miyamoto MRN: [id number] Date of Birth: 11-23-1940  Subjective/Objective:   Request for phone call back to Dr Renae GlossWieting sent per need for HH-PT and RN order.                Action/Plan:   Expected Discharge Date:                  Expected Discharge Plan:     In-House Referral:     Discharge planning Services     Post Acute Care Choice:    Choice offered to:     DME Arranged:    DME Agency:     HH Arranged:    HH Agency:     Status of Service:     If discussed at [employer] Length of Stay Meetings, dates discussed:    Additional Comments:  Adanya Sosinski A, RN 04/26/2016, 3:25 PM

## 2016-04-26 NOTE — Care Management Important Message (Signed)
Important Message  Patient Details  Name: [name] MRN: [id number] Date of Birth: 26-Aug-1940   Medicare Important Message Given:  Yes    Jaziyah Gradel A, RN 04/26/2016, 7:06 AM

## 2016-04-26 NOTE — Discharge Instructions (Signed)
Edema °Edema is an abnormal buildup of fluids. It is more common in your legs and thighs. Painless swelling of the feet and ankles is more likely as a person ages. It also is common in looser skin, like around your eyes. °HOME CARE  °· Keep the affected body part above the level of the heart while lying down. °· Do not sit still or stand for a long time. °· Do not put anything right under your knees when you lie down. °· Do not wear tight clothes on your upper legs. °· Exercise your legs to help the puffiness (swelling) go down. °· Wear elastic bandages or support stockings as told by your doctor. °· A low-salt diet may help lessen the puffiness. °· Only take medicine as told by your doctor. °GET HELP IF: °· Treatment is not working. °· You have heart, liver, or kidney disease and notice that your skin looks puffy or shiny. °· You have puffiness in your legs that does not get better when you raise your legs. °· You have sudden weight gain for no reason. °GET HELP RIGHT AWAY IF:  °· You have shortness of breath or chest pain. °· You cannot breathe when you lie down. °· You have pain, redness, or warmth in the areas that are puffy. °· You have heart, liver, or kidney disease and get edema all of a sudden. °· You have a fever and your symptoms get worse all of a sudden. °MAKE SURE YOU:  °· Understand these instructions. °· Will watch your condition. °· Will get help right away if you are not doing well or get worse. °  °This information is not intended to replace advice given to you by your health care provider. Make sure you discuss any questions you have with your health care provider. °  °Document Released: 01/12/2008 Document Revised: 07/31/2013 Document Reviewed: 05/18/2013 °Elsevier Interactive Patient Education ©2016 Else[employer] Inc[employer] ° °

## 2016-04-26 NOTE — Progress Notes (Signed)
Physical Therapy Treatment Patient Details Name: [name] MRN: [id number] DOB: 1941/08/09 Today's Date: 04/26/2016    History of Present Illness 75 y.o. male with a known history of Bipolar 1 disorder, chronic lower extremity swelling, congestive heart failure, COPD, previous history of DVT, GERD, hyperlipidemia, hypertension, schizophrenia and seizure who [facility]. Who states that he has not been feeling well for the past 1 week. He has been very weak and has had generalized weakness.  Pt had a fall the day prior to admission.     PT Comments    Pt up in chair 90/90 position. Pt reports 7/10 back pain and requests medication during session. Pt requires encouragement to participate with PT, but ultimately agreeable to exercises. Pt participates in seated and long sit exercises with minimal assist as needed. Pt offered ambulation and/or stand activities, but refuses for multiple reasons (pain, already doing, visitors expected). Pt repositioned to long sit post session due to edematous lower extremities. Continue PT to progress strength, endurance and improve all functional mobilities  Follow Up Recommendations  Home health PT     Equipment Recommendations       Recommendations for Other Services       Precautions / Restrictions Precautions Precautions: Fall Restrictions Weight Bearing Restrictions: No    Mobility  Bed Mobility               General bed mobility comments: Not tested; pt up in chair  Transfers                 General transfer comment: Not tested; pt refused out of chair/ambulation at this tiime  Ambulation/Gait             General Gait Details: Refused   Stairs            Wheelchair Mobility    Modified Rankin (Stroke Patients Only)       Balance                                    Cognition Arousal/Alertness: Awake/alert Behavior During Therapy: WFL for tasks  assessed/performed Overall Cognitive Status: History of cognitive impairments - at baseline                      Exercises General Exercises - Lower Extremity Ankle Circles/Pumps: AROM;Both;20 reps (long sit) Quad Sets: Strengthening;Both;20 reps (long sit) Gluteal Sets: Strengthening;Both;20 reps (long sit) Long Arc Quad: AROM;Both;10 reps;Seated (2 sets) Heel Slides: AAROM;Both;20 reps (long sit; partial range) Hip ABduction/ADduction: AROM;Both;20 reps;Seated (performed B with knees at 90 degrees) Hip Flexion/Marching: AROM;Both;20 reps;Seated Toe Raises: AROM;Both;20 reps;Seated Heel Raises: AROM;Both;20 reps;Seated    General Comments General comments (skin integrity, edema, etc.): Edmeatous BLEs      Pertinent Vitals/Pain Pain Assessment: 0-10 Pain Score: 7  Pain Location: Back Pain Descriptors / Indicators: Constant Pain Intervention(s): Monitored during session;Patient requesting pain meds-RN notified;Limited activity within patient's tolerance    Home Living                      Prior Function            PT Goals (current goals can now be found in the care plan section)      Frequency    Min 2X/week      PT Plan Current plan remains appropriate    Co-evaluation  End of Session   Activity Tolerance: Patient limited by pain;Other (comment) (self limiting) Patient left: in chair;with call bell/phone within reach;with chair alarm set     Time: 1610-9604 PT Time Calculation (min) (ACUTE ONLY): 24 min  Charges:  $Therapeutic Exercise: 23-37 mins                    G Codes:      Scot Dock, PTA 04/26/2016, 11:30 AM

## 2016-04-26 NOTE — Clinical Social Work Note (Signed)
[facility] and extended a bed offer. Patient accepted the bed offer. [facility]. A PPD was initiated at hospital and will be ready by [facility] nurse on Wednesday. Discharge information sent with patient and a follow up appt was made with Phineas Realharles Drew for Friday. DSS APS representative came to see patient prior to patient leaving this afternoon and will follow up with patient at his new group home. Ardyth GalQueen Love notified and will work with patient's new group home to transfer his belongings. York SpanielMonica Duvid Smalls MSW,LCSW [phone]

## 2016-04-26 NOTE — Progress Notes (Signed)
Pt discharged via wheelchair with belongings to care home waiting [facility].  Pt assisted up into the van by me, CNA and orderly.  I informed driver to make sure she had adequate staff to assist the [place] getting [place]

## 2016-04-27 NOTE — Clinical Social Work Note (Signed)
The DSS APS worker assigned to patient's case for investigation into his allegations of [place] is: Arthur HolmsChristina Doss at [phone] and cell: [phone]. York SpanielMonica Prajna Vanderpool MSW,LCSW [phone]

## 2016-05-05 NOTE — Clinical Social Work Note (Signed)
DSS APS, [name], had called to inquire if there was any bruising noted on admission of patient. CSW reviewed record and informed Ms. [name] of what was documented. York SpanielMonica Monzerat [name] MSW,LCSW [phone]

## 2016-05-09 DIAGNOSIS — A0472 Enterocolitis due to Clostridium difficile, not specified as recurrent: Secondary | ICD-10-CM

## 2016-05-09 HISTORY — DX: Enterocolitis due to Clostridium difficile, not specified as recurrent: A04.72

## 2016-05-21 ENCOUNTER — Emergency Department: Payer: Medicare Other

## 2016-05-21 ENCOUNTER — Inpatient Hospital Stay
Admission: EM | Admit: 2016-05-21 | Discharge: 2016-05-28 | DRG: 871 | Disposition: A | Discharge status: Skilled Nursing Facility | Payer: Medicare Other | Attending: Internal Medicine | Admitting: Internal Medicine

## 2016-05-21 DIAGNOSIS — I110 Hypertensive heart disease with heart failure: Secondary | ICD-10-CM | POA: Diagnosis present

## 2016-05-21 DIAGNOSIS — I82403 Acute embolism and thrombosis of unspecified deep veins of lower extremity, bilateral: Secondary | ICD-10-CM | POA: Diagnosis present

## 2016-05-21 DIAGNOSIS — Z880 Allergy status to penicillin: Secondary | ICD-10-CM

## 2016-05-21 DIAGNOSIS — N179 Acute kidney failure, unspecified: Secondary | ICD-10-CM | POA: Diagnosis present

## 2016-05-21 DIAGNOSIS — I872 Venous insufficiency (chronic) (peripheral): Secondary | ICD-10-CM | POA: Diagnosis present

## 2016-05-21 DIAGNOSIS — Z7982 Long term (current) use of aspirin: Secondary | ICD-10-CM

## 2016-05-21 DIAGNOSIS — A0472 Enterocolitis due to Clostridium difficile, not specified as recurrent: Secondary | ICD-10-CM | POA: Diagnosis present

## 2016-05-21 DIAGNOSIS — Z79899 Other long term (current) drug therapy: Secondary | ICD-10-CM

## 2016-05-21 DIAGNOSIS — Z1621 Resistance to vancomycin: Secondary | ICD-10-CM | POA: Diagnosis present

## 2016-05-21 DIAGNOSIS — R0602 Shortness of breath: Secondary | ICD-10-CM

## 2016-05-21 DIAGNOSIS — B952 Enterococcus as the cause of diseases classified elsewhere: Secondary | ICD-10-CM | POA: Diagnosis present

## 2016-05-21 DIAGNOSIS — M6281 Muscle weakness (generalized): Secondary | ICD-10-CM

## 2016-05-21 DIAGNOSIS — E876 Hypokalemia: Secondary | ICD-10-CM | POA: Diagnosis present

## 2016-05-21 DIAGNOSIS — R1011 Right upper quadrant pain: Secondary | ICD-10-CM

## 2016-05-21 DIAGNOSIS — N320 Bladder-neck obstruction: Secondary | ICD-10-CM

## 2016-05-21 DIAGNOSIS — W19XXXA Unspecified fall, initial encounter: Secondary | ICD-10-CM | POA: Diagnosis present

## 2016-05-21 DIAGNOSIS — F319 Bipolar disorder, unspecified: Secondary | ICD-10-CM

## 2016-05-21 DIAGNOSIS — N390 Urinary tract infection, site not specified: Secondary | ICD-10-CM

## 2016-05-21 DIAGNOSIS — N3001 Acute cystitis with hematuria: Secondary | ICD-10-CM | POA: Diagnosis present

## 2016-05-21 DIAGNOSIS — F209 Schizophrenia, unspecified: Secondary | ICD-10-CM | POA: Diagnosis present

## 2016-05-21 DIAGNOSIS — G40909 Epilepsy, unspecified, not intractable, without status epilepticus: Secondary | ICD-10-CM | POA: Diagnosis present

## 2016-05-21 DIAGNOSIS — N401 Enlarged prostate with lower urinary tract symptoms: Secondary | ICD-10-CM | POA: Diagnosis present

## 2016-05-21 DIAGNOSIS — K219 Gastro-esophageal reflux disease without esophagitis: Secondary | ICD-10-CM | POA: Diagnosis present

## 2016-05-21 DIAGNOSIS — R531 Weakness: Secondary | ICD-10-CM

## 2016-05-21 DIAGNOSIS — N400 Enlarged prostate without lower urinary tract symptoms: Secondary | ICD-10-CM

## 2016-05-21 DIAGNOSIS — J449 Chronic obstructive pulmonary disease, unspecified: Secondary | ICD-10-CM | POA: Diagnosis present

## 2016-05-21 DIAGNOSIS — N3000 Acute cystitis without hematuria: Secondary | ICD-10-CM

## 2016-05-21 DIAGNOSIS — Z66 Do not resuscitate: Secondary | ICD-10-CM | POA: Diagnosis not present

## 2016-05-21 DIAGNOSIS — B9620 Unspecified Escherichia coli [E. coli] as the cause of diseases classified elsewhere: Secondary | ICD-10-CM | POA: Diagnosis present

## 2016-05-21 DIAGNOSIS — R0603 Acute respiratory distress: Secondary | ICD-10-CM | POA: Diagnosis present

## 2016-05-21 DIAGNOSIS — I878 Other specified disorders of veins: Secondary | ICD-10-CM | POA: Diagnosis present

## 2016-05-21 DIAGNOSIS — Z86718 Personal history of other venous thrombosis and embolism: Secondary | ICD-10-CM

## 2016-05-21 DIAGNOSIS — R293 Abnormal posture: Secondary | ICD-10-CM

## 2016-05-21 DIAGNOSIS — R52 Pain, unspecified: Secondary | ICD-10-CM

## 2016-05-21 DIAGNOSIS — I5033 Acute on chronic diastolic (congestive) heart failure: Secondary | ICD-10-CM | POA: Diagnosis present

## 2016-05-21 DIAGNOSIS — A498 Other bacterial infections of unspecified site: Secondary | ICD-10-CM

## 2016-05-21 DIAGNOSIS — N189 Chronic kidney disease, unspecified: Secondary | ICD-10-CM

## 2016-05-21 DIAGNOSIS — R6520 Severe sepsis without septic shock: Secondary | ICD-10-CM

## 2016-05-21 DIAGNOSIS — A419 Sepsis, unspecified organism: Principal | ICD-10-CM | POA: Diagnosis present

## 2016-05-21 DIAGNOSIS — M549 Dorsalgia, unspecified: Secondary | ICD-10-CM

## 2016-05-21 DIAGNOSIS — R0600 Dyspnea, unspecified: Secondary | ICD-10-CM | POA: Diagnosis present

## 2016-05-21 HISTORY — DX: Chronic kidney disease, unspecified: N18.9

## 2016-05-21 LAB — CBC WITH DIFFERENTIAL/PLATELET
Basophils Absolute: 0.1 10*3/uL (ref 0–0.1)
Basophils Relative: 1 %
EOS ABS: 0 10*3/uL (ref 0–0.7)
EOS PCT: 0 %
HCT: 37.6 % — ABNORMAL LOW (ref 40.0–52.0)
Hemoglobin: 12.5 g/dL — ABNORMAL LOW (ref 13.0–18.0)
LYMPHS ABS: 1 10*3/uL (ref 1.0–3.6)
Lymphocytes Relative: 10 %
MCH: 29.3 pg (ref 26.0–34.0)
MCHC: 33.4 g/dL (ref 32.0–36.0)
MCV: 87.8 fL (ref 80.0–100.0)
MONO ABS: 0.7 10*3/uL (ref 0.2–1.0)
Monocytes Relative: 7 %
Neutro Abs: 8.3 10*3/uL — ABNORMAL HIGH (ref 1.4–6.5)
Neutrophils Relative %: 82 %
PLATELETS: 172 10*3/uL (ref 150–440)
RBC: 4.28 MIL/uL — AB (ref 4.40–5.90)
RDW: 15.7 % — AB (ref 11.5–14.5)
WBC: 10.1 10*3/uL (ref 3.8–10.6)

## 2016-05-21 LAB — URINALYSIS COMPLETE WITH MICROSCOPIC (ARMC ONLY)
Bilirubin Urine: NEGATIVE
GLUCOSE, UA: NEGATIVE mg/dL
Ketones, ur: NEGATIVE mg/dL
NITRITE: POSITIVE — AB
PROTEIN: NEGATIVE mg/dL
SQUAMOUS EPITHELIAL / LPF: NONE SEEN
Specific Gravity, Urine: 1.015 (ref 1.005–1.030)
pH: 5 (ref 5.0–8.0)

## 2016-05-21 LAB — COMPREHENSIVE METABOLIC PANEL
ALBUMIN: 3.6 g/dL (ref 3.5–5.0)
ALK PHOS: 102 U/L (ref 38–126)
ALT: 8 U/L — ABNORMAL LOW (ref 17–63)
ANION GAP: 10 (ref 5–15)
AST: 19 U/L (ref 15–41)
BILIRUBIN TOTAL: 0.8 mg/dL (ref 0.3–1.2)
BUN: 64 mg/dL — ABNORMAL HIGH (ref 6–20)
CALCIUM: 8.8 mg/dL — AB (ref 8.9–10.3)
CO2: 23 mmol/L (ref 22–32)
Chloride: 106 mmol/L (ref 101–111)
Creatinine, Ser: 4.9 mg/dL — ABNORMAL HIGH (ref 0.61–1.24)
GFR calc non Af Amer: 11 mL/min — ABNORMAL LOW (ref >60)
GFR, EST AFRICAN AMERICAN: 12 mL/min — AB (ref >60)
GLUCOSE: 103 mg/dL — AB (ref 65–99)
Potassium: 4.7 mmol/L (ref 3.5–5.1)
Sodium: 139 mmol/L (ref 135–145)
TOTAL PROTEIN: 7.8 g/dL (ref 6.5–8.1)

## 2016-05-21 LAB — INFLUENZA PANEL BY PCR (TYPE A & B)
H1N1FLUPCR: NOT DETECTED
INFLBPCR: NEGATIVE
Influenza A By PCR: NEGATIVE

## 2016-05-21 LAB — LACTIC ACID, PLASMA: LACTIC ACID, VENOUS: 1.1 mmol/L (ref 0.5–1.9)

## 2016-05-21 LAB — PROTIME-INR
INR: 1.15
PROTHROMBIN TIME: 14.8 s (ref 11.4–15.2)

## 2016-05-21 LAB — LIPASE, BLOOD: LIPASE: 41 U/L (ref 11–51)

## 2016-05-21 LAB — TROPONIN I: Troponin I: 0.03 ng/mL (ref <0.03)

## 2016-05-21 LAB — BRAIN NATRIURETIC PEPTIDE: B Natriuretic Peptide: 92 pg/mL (ref 0.0–100.0)

## 2016-05-21 MED ORDER — PANTOPRAZOLE SODIUM 40 MG PO TBEC
40.0000 mg | DELAYED_RELEASE_TABLET | Freq: Every day | ORAL | Status: DC
  Administered 2016-05-22 – 2016-05-28 (×7): 40 mg via ORAL
  Filled 2016-05-21 (×7): qty 1

## 2016-05-21 MED ORDER — BUPROPION HCL ER (XL) 300 MG PO TB24
300.0000 mg | ORAL_TABLET | ORAL | Status: DC
  Administered 2016-05-22 – 2016-05-28 (×7): 300 mg via ORAL
  Filled 2016-05-21 (×7): qty 1

## 2016-05-21 MED ORDER — VITAMIN D 1000 UNITS PO TABS
1000.0000 [IU] | ORAL_TABLET | Freq: Every day | ORAL | Status: DC
  Administered 2016-05-22 – 2016-05-28 (×7): 1000 [IU] via ORAL
  Filled 2016-05-21 (×7): qty 1

## 2016-05-21 MED ORDER — ACETAMINOPHEN 650 MG RE SUPP: 650.0000 mg | Freq: Four times a day (QID) | RECTAL | Status: DC | PRN

## 2016-05-21 MED ORDER — PRAVASTATIN SODIUM 40 MG PO TABS
40.0000 mg | ORAL_TABLET | Freq: Every day | ORAL | Status: DC
  Administered 2016-05-21 – 2016-05-28 (×8): 40 mg via ORAL
  Filled 2016-05-21 (×8): qty 1

## 2016-05-21 MED ORDER — PHENOBARBITAL 32.4 MG PO TABS
129.6000 mg | ORAL_TABLET | ORAL | Status: DC
  Administered 2016-05-22 – 2016-05-28 (×4): 129.6 mg via ORAL
  Filled 2016-05-21 (×4): qty 4

## 2016-05-21 MED ORDER — ASPIRIN 81 MG PO CHEW
81.0000 mg | CHEWABLE_TABLET | Freq: Every day | ORAL | Status: DC
  Administered 2016-05-22 – 2016-05-28 (×7): 81 mg via ORAL
  Filled 2016-05-21 (×7): qty 1

## 2016-05-21 MED ORDER — ORAL CARE MOUTH RINSE
15.0000 mL | Freq: Two times a day (BID) | OROMUCOSAL | Status: DC
  Administered 2016-05-24 – 2016-05-28 (×8): 15 mL via OROMUCOSAL

## 2016-05-21 MED ORDER — ALBUTEROL SULFATE (2.5 MG/3ML) 0.083% IN NEBU: 2.5000 mg | INHALATION_SOLUTION | Freq: Four times a day (QID) | RESPIRATORY_TRACT | Status: DC | PRN

## 2016-05-21 MED ORDER — CARVEDILOL 6.25 MG PO TABS
6.2500 mg | ORAL_TABLET | Freq: Two times a day (BID) | ORAL | Status: DC
  Administered 2016-05-21 – 2016-05-27 (×11): 6.25 mg via ORAL
  Filled 2016-05-21 (×12): qty 1

## 2016-05-21 MED ORDER — SODIUM CHLORIDE 0.9 % IV SOLN: Freq: Once | INTRAVENOUS | Status: DC

## 2016-05-21 MED ORDER — CEFTRIAXONE SODIUM IN DEXTROSE 20 MG/ML IV SOLN
1.0000 g | Freq: Once | INTRAVENOUS | Status: AC
  Administered 2016-05-21: 1 g via INTRAVENOUS
  Filled 2016-05-21: qty 50

## 2016-05-21 MED ORDER — ACETAMINOPHEN 325 MG PO TABS
650.0000 mg | ORAL_TABLET | Freq: Four times a day (QID) | ORAL | Status: DC | PRN
  Administered 2016-05-22 – 2016-05-24 (×4): 650 mg via ORAL
  Filled 2016-05-21 (×4): qty 2

## 2016-05-21 MED ORDER — HEPARIN SODIUM (PORCINE) 5000 UNIT/ML IJ SOLN
5000.0000 [IU] | Freq: Three times a day (TID) | INTRAMUSCULAR | Status: DC
Start: 2016-05-21 — End: 2016-05-24
  Administered 2016-05-21 – 2016-05-24 (×7): 5000 [IU] via SUBCUTANEOUS
  Filled 2016-05-21 (×7): qty 1

## 2016-05-21 MED ORDER — CARVEDILOL 25 MG PO TABS: 25.0000 mg | ORAL_TABLET | Freq: Two times a day (BID) | ORAL | Status: DC

## 2016-05-21 MED ORDER — CEFTRIAXONE SODIUM IN DEXTROSE 20 MG/ML IV SOLN
1.0000 g | INTRAVENOUS | Status: DC
  Filled 2016-05-21: qty 50

## 2016-05-21 MED ORDER — ACETAMINOPHEN 650 MG RE SUPP
RECTAL | Status: AC
  Administered 2016-05-21: 650 mg via RECTAL
  Filled 2016-05-21: qty 1

## 2016-05-21 MED ORDER — POLYETHYLENE GLYCOL 3350 17 G PO PACK: 17.0000 g | PACK | Freq: Every day | ORAL | Status: DC | PRN

## 2016-05-21 MED ORDER — SODIUM CHLORIDE 0.9 % IV SOLN
INTRAVENOUS | Status: DC
  Administered 2016-05-21 – 2016-05-23 (×3): via INTRAVENOUS

## 2016-05-21 MED ORDER — ACETAMINOPHEN 650 MG RE SUPP
650.0000 mg | Freq: Once | RECTAL | Status: AC
  Administered 2016-05-21: 650 mg via RECTAL

## 2016-05-21 MED ORDER — ARIPIPRAZOLE 5 MG PO TABS
20.0000 mg | ORAL_TABLET | Freq: Every day | ORAL | Status: DC
  Administered 2016-05-22 – 2016-05-28 (×7): 20 mg via ORAL
  Filled 2016-05-21 (×7): qty 4

## 2016-05-21 MED ORDER — FINASTERIDE 5 MG PO TABS
5.0000 mg | ORAL_TABLET | Freq: Every day | ORAL | Status: DC
  Administered 2016-05-22 – 2016-05-28 (×7): 5 mg via ORAL
  Filled 2016-05-21 (×7): qty 1

## 2016-05-21 MED ORDER — TAMSULOSIN HCL 0.4 MG PO CAPS
0.8000 mg | ORAL_CAPSULE | Freq: Every day | ORAL | Status: DC
  Administered 2016-05-21 – 2016-05-27 (×7): 0.8 mg via ORAL
  Filled 2016-05-21 (×7): qty 2

## 2016-05-21 NOTE — H&P (Signed)
[facility] [place] [facility]   PATIENT NAME: [name]    MR#:  [id number]  DATE OF BIRTH:  10-26-1940  DATE OF ADMISSION:  05/21/2016  PRIMARY CARE PHYSICIAN: Lorenda Ishihara, MD   REQUESTING/REFERRING PHYSICIAN: Ileana Roup  CHIEF COMPLAINT:   Chief Complaint  Patient presents with  . Fall  . Respiratory Distress    HISTORY OF PRESENT ILLNESS:  [name]  is a 75 y.o. male with a known history of Bipolar disorder. He has recently been in the hospital and was seen for acute renal failure and lower extremity edema. He had a fall at his facility and EMS thought he was breathing hard and put him on BiPAP. He was able to come off the BiPAP in the emergency room. The patient's major complaint is back pain and leg pain and waist pain. The patient also complains of dizziness. The patient is not the best historian. In the ER his creatinine was found to be elevated at 4.9. Hospitalist services were contacted for further evaluation. The patient had an in and out catheter done to collect a urine which also looked positive for UTI.  PAST MEDICAL HISTORY:   Past Medical History:  Diagnosis Date  . Bipolar 1 disorder (HCC)   . CHF (congestive heart failure) (HCC)   . COPD (chronic obstructive pulmonary disease) (HCC)   . DVT (deep venous thrombosis) (HCC)   . GERD (gastroesophageal reflux disease)   . High cholesterol   . Hypertension   . Schizophrenia (HCC)   . Seizure (HCC)     PAST SURGICAL HISTORY:   Past Surgical History:  Procedure Laterality Date  . NO PAST SURGERIES      SOCIAL HISTORY:   Social History  Substance Use Topics  . Smoking status: Never Smoker  . Smokeless tobacco: Never Used  . Alcohol use No    FAMILY HISTORY:   Family History  Problem Relation Age of Onset  . Healthy Mother     DRUG ALLERGIES:   Allergies  Allergen Reactions  . Penicillin G Other (See Comments)    Convulsions. Has patient  had a PCN reaction causing immediate rash, facial/tongue/throat swelling, SOB or lightheadedness with hypotension: not sure Has patient had a PCN reaction causing severe rash involving mucus membranes or skin necrosis: not sure Has patient had a PCN reaction that required hospitalization: not sure Has patient had a PCN reaction occurring within the last 10 years: not sure If all of the above answers are "NO", then may proceed with Cephalosporin use.    REVIEW OF SYSTEMS:  CONSTITUTIONAL:Positive for fever and chills. Positive for weakness. Positive for weight loss. EYES: No blurred or double vision.  EARS, NOSE, AND THROAT: No tinnitus or ear pain.   Positive for sore throat. Occasional dysphagia to liquids.  RESPIRATORY: No cough, some shortness of breath.  nowheezing or hemoptysis.  CARDIOVASCULAR: No chest pain,  positive foredema.  GASTROINTESTINAL: No nausea, vomiting, diarrhea.  positive for abdominal pain. No blood in bowel movements GENITOURINARY: No dysuria, hematuria. positive for difficulty getting urine out.   ENDOCRINE: No polyuria, nocturia,  HEMATOLOGY: No anemia, easy bruising or bleeding SKIN: chronic lower extremity discoloration  MUSCULOSKELETAL: positive for back pain positive for right leg pain and waist pain.   NEUROLOGIC: No tingling, numbness, weakness.  PSYCHIATRY:  positive for anxiety and  depression.   MEDICATIONS AT HOME:   Prior to Admission medications   Medication Sig Start Date End Date Taking? Authorizing Provider  acetaminophen (TYLENOL) 325 MG tablet Take 2 tablets (650 mg total) by mouth every 6 (six) hours as needed. Patient taking differently: Take 650 mg by mouth every 6 (six) hours as needed. For temp >101. 04/26/16  Yes Alford Highland, MD  albuterol (VENTOLIN HFA) 108 (90 Base) MCG/ACT inhaler Inhale 2 puffs into the lungs every 6 (six) hours as needed for shortness of breath.   Yes Historical Provider, MD  amLODipine (NORVASC) 2.5 MG tablet Take  1 tablet (2.5 mg total) by mouth daily. 04/26/16  Yes Kruz Chiu Renae Gloss, MD  ARIPiprazole (ABILIFY) 20 MG tablet Take 1 tablet (20 mg total) by mouth daily. Patient taking differently: Take 20 mg by mouth every morning.  04/26/16  Yes Alford Highland, MD  aspirin 81 MG chewable tablet Chew 81 mg by mouth daily.   Yes Historical Provider, MD  buPROPion (BUDEPRION XL) 300 MG 24 hr tablet Take 1 tablet (300 mg total) by mouth daily. Patient taking differently: Take 300 mg by mouth every morning.  04/26/16  Yes Alford Highland, MD  carvedilol (COREG) 25 MG tablet Take 1 tablet (25 mg total) by mouth 2 (two) times daily. 04/26/16  Yes Maudine Kluesner Renae Gloss, MD  Cholecalciferol (VITAMIN D-1000 MAX ST) 1000 units tablet Take 1 tablet (1,000 Units total) by mouth daily. 04/26/16  Yes Malissie Musgrave Renae Gloss, MD  finasteride (PROSCAR) 5 MG tablet Take 1 tablet (5 mg total) by mouth daily. 04/27/16  Yes Alford Highland, MD  furosemide (LASIX) 40 MG tablet Take 1 tablet (40 mg total) by mouth daily. 04/27/16  Yes Lennon Richins Renae Gloss, MD  omeprazole (PRILOSEC) 20 MG capsule Take 2 capsules (40 mg total) by mouth daily. 04/26/16  Yes Rhina Kramme Renae Gloss, MD  PHENobarbital (LUMINAL) 64.8 MG tablet Take 2 tablets (129.6 mg total) by mouth every other day. 04/26/16  Yes Filbert Craze Renae Gloss, MD  polyethylene glycol (MIRALAX / GLYCOLAX) packet Take 17 g by mouth daily as needed for moderate constipation. 04/26/16  Yes Alford Highland, MD  potassium chloride (K-DUR,KLOR-CON) 10 MEQ tablet Take 1 tablet (10 mEq total) by mouth daily. 04/26/16  Yes Alford Highland, MD  pravastatin (PRAVACHOL) 40 MG tablet Take 1 tablet (40 mg total) by mouth daily. 04/26/16  Yes Alford Highland, MD  terazosin (HYTRIN) 5 MG capsule Take 1 capsule (5 mg total) by mouth daily. 04/26/16  Yes Alford Highland, MD      VITAL SIGNS:  Blood pressure (!) 112/59, pulse 86, temperature (!) 101.2 F (38.4 C), temperature source Rectal, resp. rate (!) 23, SpO2 99 %.  PHYSICAL  EXAMINATION:  GENERAL:  75 y.o.-year-old patient lying in the bed with no acute distress.  EYES: Pupils equal, round, reactive to light and accommodation. No scleral icterus. Extraocular muscles intact.  HEENT: Head atraumatic, normocephalic. Oropharynx and nasopharynx clear.  NECK:  Supple, no jugular venous distention. No thyroid enlargement, no tenderness.  LUNGS: Decreased  breath sounds bilaterally, no wheezing, rales,rhonchi or crepitation. No use of accessory muscles of respiration.  CARDIOVASCULAR: S1, S2 normal. 3/6 systolic  murmurs,  norubs, or gallops.  ABDOMEN: Soft, positive for lower abdominal tenderness, distended. Bowel sounds present.  abdominal mass felt over the umbilicus EXTREMITIES:  4+ edema,  nocyanosis, or clubbing.  NEUROLOGIC: Cranial nerves II through XII are intact. Muscle strength 5/5 in all extremities. Sensation intact. Gait not checked.  PSYCHIATRIC: The patient is alert and oriented x 3.  SKIN:  chronic lower extremity skin discoloration.  LABORATORY PANEL:   CBC  Recent Labs Lab 05/21/16 1756  WBC 10.1  HGB 12.5*  HCT 37.6*  PLT 172   ------------------------------------------------------------------------------------------------------------------  Chemistries   Recent Labs Lab 05/21/16 1756  NA 139  K 4.7  CL 106  CO2 23  GLUCOSE 103*  BUN 64*  CREATININE 4.90*  CALCIUM 8.8*  AST 19  ALT 8*  ALKPHOS 102  BILITOT 0.8   ------------------------------------------------------------------------------------------------------------------  Cardiac Enzymes  Recent Labs Lab 05/21/16 1756  TROPONINI 0.03*   ------------------------------------------------------------------------------------------------------------------  RADIOLOGY:  Ct Head Wo Contrast  Result Date: 05/21/2016 CLINICAL DATA:  Pain after fall. EXAM: CT HEAD WITHOUT CONTRAST TECHNIQUE: Contiguous axial images were obtained from the base of the skull through the vertex  without intravenous contrast. COMPARISON:  February 21, 2015 FINDINGS: Brain: No subdural, epidural, or subarachnoid hemorrhage. No mass, mass effect, or midline shift. Ventricles and sulci are stable. The cerebellum is normal. There is a small lacunar infarct is seen in the pons, unchanged and chronic in appearance. Basal cisterns are widely patent. No mass, mass effect, or midline shift. Scattered lacunar infarcts in the basal ganglia. Moderate to severe white matter changes, stable. No acute cortical ischemia or infarct. Vascular: Calcified atherosclerosis is seen in the intracranial carotid arteries. Skull: Normal. Negative for fracture or focal lesion. Sinuses/Orbits: There is debris in the right sphenoid sinus. The remainder of the paranasal sinuses and the left mastoid air cells are normal. The middle ears are well aerated. There is opacification of the inferior right mastoid air cells with associated sclerosis, unchanged. This likely represents a region of previous mastoiditis. No adjacent soft tissue swelling or erosion to suggest an acute process. Other: Extracranial soft tissues are normal. IMPRESSION: 1. No acute intracranial process. 2. Debris in the right sphenoid sinus. Opacification and sclerosis in the inferior right mastoid air cells, chronic, of no acute significance. Electronically Signed   By: Gerome Samavid  Williams III M.D   On: 05/21/2016 18:49   Dg Chest Port 1 View  Result Date: 05/21/2016 CLINICAL DATA:  Patient's slipped out of bed and hit right side of head. Dizziness with bilateral lower leg edema. History of CHF. EXAM: PORTABLE CHEST 1 VIEW COMPARISON:  02/21/2015. FINDINGS: 1812 hours. The lungs are clear wiithout focal pneumonia, edema, pneumothorax or pleural effusion. Cardiopericardial silhouette is at upper limits of normal for size. The visualized bony structures of the thorax are intact. Telemetry leads overlie the chest. IMPRESSION: No active disease. Electronically Signed   By: Kennith CenterEric   Mansell M.D.   On: 05/21/2016 18:32    EKG:   Sinus rhythm 90 bpm, VPCs, right H enlargement, intraventricular conduction delay  IMPRESSION AND PLAN:   1. Clinical sepsis with fever, tachycardia, acute cystitis with hematuria. IV Rocephin. Send urine culture and blood cultures. 2. Acute kidney injury which is postobstructive in nature. I had the nurse in the ER do a bladder scan and over the umbilicus did have greater than 999 in the bladder. Once the Foley was placed 3 L was drained out. 3. BPH with obstruction. Change terazosin to high-dose Flomax. Continue finasteride. Foley will have to stay in for at least a week. Will need urology as outpatient. 4. Acute respiratory distress on presentation. Patient required BiPAP. Now patient breathing comfortably. Continue oxygen. 5. Essential hypertension continue Coreg 6. Lower extremity edema. Hopefully we'll be able to continue Lasix once acute renal failure improves. 7. Bipolar disorder. Continue psychiatric medication 8. History of seizure on phenobarbital. Check phenobarbital level. 9. Weakness. Physical therapy evaluation 10. Social worker reevaluation about placement options    All the  records are reviewed and case discussed with ED provider. Management plans discussed with the patient, and he is  in agreement.  CODE STATUS: full code  TOTAL TIME TAKING CARE OF THIS PATIENT: 55 minutes.    Alford Highland M.D on 05/21/2016 at 7:41 PM  Between 7am to 6pm - Pager - [phone]  After 6pm call admission pager [phone]  [facility]  [phone]  CC: Primary care physician; Lorenda Ishihara, MD

## 2016-05-21 NOTE — ED Notes (Signed)
Bladder scan done. Bladder residuals showing > 999 on three separate scans.

## 2016-05-21 NOTE — ED Notes (Signed)
Patient reported that he is feeling "much better" after the foley was inserted. Patient resting quietly. He is oxygenating well; SPO2 98% on 2L/[place]; no increased WOB noted. Patient is pending admission to inpatient hospital until.

## 2016-05-21 NOTE — ED Notes (Signed)
Pt taken off bipap and placed on 2 L [place] per Dr. Alphonzo LemmingsMcshane.

## 2016-05-21 NOTE — ED Notes (Addendum)
[facility]; charge nurse taking patient. Charge nurse tied up in an isolation room at this time; will return call. Patient stable and  beds not acutely needed at this time; ok to hold in the interim while floor nurse completes competing duties. ED charge nurse aware of transport delay.

## 2016-05-21 NOTE — ED Provider Notes (Signed)
[facility] Emergency Department Provider Note  ____________________________________________   I have reviewed the triage vital signs and the nursing notes.   HISTORY  Chief Complaint Fall and Respiratory Distress    HPI [name] is a 75 y.o. male multiple medical problems includingCOPD CHF schizophrenia and seizure disorder high cholesterol bipolar DVT presents today with multiple complaints. Patient states he's been feeling febrile and weak for the last few days and having dysuria and urinary frequency. Patient is a very poor historian. He states that he has had also mild shortness of breath and increased leg sling for one week. Today, he slid out of the bed, hit his head did not pass out. He states he was feeling weak. EMS found him with good oxygen saturation but when I try to walk him he seemed short of breath to them so they put him on BiPAP "for his comfort".        Past Medical History:  Diagnosis Date  . Bipolar 1 disorder (HCC)   . CHF (congestive heart failure) (HCC)   . COPD (chronic obstructive pulmonary disease) (HCC)   . DVT (deep venous thrombosis) (HCC)   . GERD (gastroesophageal reflux disease)   . High cholesterol   . Hypertension   . Schizophrenia (HCC)   . Seizure [facility])     Patient Active Problem List   Diagnosis Date Noted  . UTI (lower urinary tract infection) 04/23/2016  . 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 Pneumonia 02/21/2015  . ARF (acute renal failure) (HCC) 02/21/2015  . Hypokalemia 02/21/2015  . Leukocytosis 02/21/2015    History reviewed. No pertinent surgical history.  Prior to Admission medications   Medication Sig Start Date End Date Taking? Authorizing Provider  acetaminophen (TYLENOL) 325 MG tablet Take 2 tablets (650 mg total) by mouth every 6 (six) hours as needed. 04/26/16   Alford Highland, MD  amLODipine (NORVASC) 2.5 MG tablet Take 1 tablet (2.5 mg  total) by mouth daily. 04/26/16   Alford Highland, MD  ARIPiprazole (ABILIFY) 20 MG tablet Take 1 tablet (20 mg total) by mouth daily. 04/26/16   Alford Highland, MD  aspirin EC 81 MG EC tablet Take 1 tablet (81 mg total) by mouth daily. 04/27/16   Alford Highland, MD  buPROPion (BUDEPRION XL) 300 MG 24 hr tablet Take 1 tablet (300 mg total) by mouth daily. 04/26/16   Alford Highland, MD  carvedilol (COREG) 25 MG tablet Take 1 tablet (25 mg total) by mouth 2 (two) times daily. 04/26/16   Alford Highland, MD  Cholecalciferol (VITAMIN D-1000 MAX ST) 1000 units tablet Take 1 tablet (1,000 Units total) by mouth daily. 04/26/16   Alford Highland, MD  finasteride (PROSCAR) 5 MG tablet Take 1 tablet (5 mg total) by mouth daily. 04/27/16   Alford Highland, MD  furosemide (LASIX) 40 MG tablet Take 1 tablet (40 mg total) by mouth daily. 04/27/16   Alford Highland, MD  omeprazole (PRILOSEC) 20 MG capsule Take 2 capsules (40 mg total) by mouth daily. 04/26/16   Alford Highland, MD  PHENobarbital (LUMINAL) 64.8 MG tablet Take 2 tablets (129.6 mg total) by mouth every other day. 04/26/16   Alford Highland, MD  polyethylene glycol [facility] Ethelene Hal) packet Take 17 g by mouth daily as needed for moderate constipation. 04/26/16   Alford Highland, MD  potassium chloride (K-DUR,KLOR-CON) 10 MEQ tablet Take 1 tablet (10 mEq total) by mouth daily. 04/26/16   Richard Renae Gloss,  MD  pravastatin (PRAVACHOL) 40 MG tablet Take 1 tablet (40 mg total) by mouth daily. 04/26/16   Alford Highlandichard Wieting, MD  terazosin (HYTRIN) 5 MG capsule Take 1 capsule (5 mg total) by mouth daily. 04/26/16   Alford Highlandichard Wieting, MD    Allergies Penicillins  No family history on file.  Social History Social History  Substance Use Topics  . Smoking status: Never Smoker  . Smokeless tobacco: Never Used  . Alcohol use No    Review of Systems Constitutional: No fever/chills Eyes: No visual changes. ENT: No sore throat. No stiff neck no neck  pain Cardiovascular: Denies chest pain. Respiratory: Mild shortness of breath. Gastrointestinal:   no vomiting.  No diarrhea.  No constipation. Genitourinary: Positive for dysuria. Musculoskeletal: Positive lower extremity swelling Skin: Negative for rash. Neurological: Negative for severe headaches, focal weakness or numbness. 10-point ROS otherwise negative.  ____________________________________________   PHYSICAL EXAM:  VITAL SIGNS: ED Triage Vitals  Enc Vitals Group     BP 05/21/16 1755 (!) 158/93     Pulse Rate 05/21/16 1755 87     Resp 05/21/16 1755 (!) 22     Temp 05/21/16 1815 (!) 101.2 F (38.4 C)     Temp Source 05/21/16 1815 Rectal     SpO2 05/21/16 1755 100 %     Weight --      Height --      Head Circumference --      Peak Flow --      Pain Score --      Pain Loc --      Pain Edu? --      Excl. in GC? --     Constitutional: Alert and oriented. On BiPAP initially, chronically ill appearing but not acutely toxic Eyes: Conjunctivae are normal. PERRL. EOMI. Head: Atraumatic. Nose: No congestion/rhinnorhea. Mouth/Throat: Mucous membranes are moist.  Oropharynx non-erythematous. Neck: No stridor.   Nontender with no meningismus Cardiovascular: Normal rate, regular rhythm. Grossly normal heart sounds.  Good peripheral circulation. Respiratory: On BiPAP I do not auscultate any wheezes rales or rhonchi Abdominal: Soft and nontender. No distention. No guarding no rebound Back:  There is no focal tenderness or step off.  there is no midline tenderness there are no lesions noted. there is no CVA tenderness Musculoskeletal: No lower extremity tenderness, no upper extremity tenderness. No joint effusions, no DVT signs strong distal pulses very significant 3+ bilateral symmetric pitting edema Neurologic:  Normal speech and language. No gross focal neurologic deficits are appreciated.  Skin:  Skin is warm, dry and intact. No rash noted. Psychiatric: Mood and affect are  normal. Speech and behavior are normal.  ____________________________________________   LABS (all labs ordered are listed, but only abnormal results are displayed)  Labs Reviewed  COMPREHENSIVE METABOLIC PANEL - Abnormal; Notable for the following:       Result Value   Glucose, Bld 103 (*)    BUN 64 (*)    Creatinine, Ser 4.90 (*)    Calcium 8.8 (*)    ALT 8 (*)    GFR calc non Af Amer 11 (*)    GFR calc Af Amer 12 (*)    All other components within normal limits  TROPONIN I - Abnormal; Notable for the following:    Troponin I 0.03 (*)    All other components within normal limits  CBC WITH DIFFERENTIAL/PLATELET - Abnormal; Notable for the following:    RBC 4.28 (*)    Hemoglobin 12.5 (*)  HCT 37.6 (*)    RDW 15.7 (*)    Neutro Abs 8.3 (*)    All other components within normal limits  URINALYSIS COMPLETEWITH MICROSCOPIC ([facility] ONLY) - Abnormal; Notable for the following:    Color, Urine AMBER (*)    APPearance CLEAR (*)    Hgb urine dipstick 1+ (*)    Nitrite POSITIVE (*)    Leukocytes, UA 1+ (*)    Bacteria, UA MANY (*)    All other components within normal limits  CULTURE, BLOOD (ROUTINE X 2)  CULTURE, BLOOD (ROUTINE X 2)  URINE CULTURE  BRAIN NATRIURETIC PEPTIDE  PROTIME-INR  LIPASE, BLOOD  LACTIC ACID, PLASMA  LACTIC ACID, PLASMA  INFLUENZA PANEL BY PCR (TYPE A & B, H1N1)   ____________________________________________  EKG  I personally interpreted any EKGs ordered by me or triage Significant baseline artifact limits viability of exam. Patient is trembling. Appears to be normal sinus rhythm, rate 90 bpm, no acute ischemia noted. ____________________________________________  RADIOLOGY  I reviewed any imaging ordered by me or triage that were performed during my shift and, if possible, patient and/or family made aware of any abnormal findings. ____________________________________________   PROCEDURES  Procedure(s) performed:  None  Procedures  Critical Care performed: CRITICAL CARE Performed by: Jeanmarie Plant   Total critical care time: 55 minutes  Critical care time was exclusive of separately billable procedures and treating other patients.  Critical care was necessary to treat or prevent imminent or life-threatening deterioration.  Critical care was time spent personally by me on the following activities: development of treatment plan with patient and/or surrogate as well as nursing, discussions with consultants, evaluation of patient's response to treatment, examination of patient, obtaining history from patient or surrogate, ordering and performing treatments and interventions, ordering and review of laboratory studies, ordering and review of radiographic studies, pulse oximetry and re-evaluation of patient's condition.   ____________________________________________   INITIAL IMPRESSION / ASSESSMENT AND PLAN / ED COURSE  Pertinent labs & imaging results that were available during my care of the patient were reviewed by me and considered in my medical decision making (see chart for details).  Patient with multiple complains. We did rapidly weaned him off the BiPAP and he is doing fine on nasal cannula. He is not complaining of any significant shortness of breath. He has a significant bump in his creatinine, which is possibly secondary to his Lasix. Unfortunately he also has a stigmata of CHF although his chest x-ray is reassuring. I will hold off therefore an IV fluids for his infection as his pressures are okay but I suspect the best thing for the patient will likely be gentle hydration over time pending further evaluation of his heart failure status as he does have again very significant edema. It is not clear why he was placed on BiPAP at this time I do not see any obvious pulmonary pathology. I do not think he has a PE. I have given him Rocephin for his urinary tract infection, he is not allergic. We  have given him PR Tylenol, I will start him on gentle hydration for his renal function, he has no evidence of ACS, we will admit him to the hospital.  Clinical Course   ____________________________________________   FINAL CLINICAL IMPRESSION(S) / ED DIAGNOSES  Final diagnoses:  SOB (shortness of breath)  Fall, initial encounter  Respiratory distress  Acute cystitis without hematuria      This chart was dictated using voice recognition software.  Despite best  efforts to proofread,  errors can occur which can change meaning.      Jeanmarie Plant, MD 05/21/16 [phone]

## 2016-05-21 NOTE — ED Notes (Signed)
Patient report called to [facility]; spoke with Delaney Meigsamara, RN. Room ready and nurse ready to assume care of patient at this time.

## 2016-05-21 NOTE — ED Notes (Signed)
Gave pt water 

## 2016-05-21 NOTE — ED Notes (Signed)
Swabbed pt for flu.

## 2016-05-21 NOTE — ED Triage Notes (Signed)
PT came to ED via EMS from [facility]. PT slipped out of bed and hit right side of head. C/o headache, dizziness. Bilateral lower leg edema. Recently taken off lasix.

## 2016-05-22 ENCOUNTER — Inpatient Hospital Stay: Payer: Medicare Other

## 2016-05-22 LAB — CBC
HCT: 38.6 % — ABNORMAL LOW (ref 40.0–52.0)
Hemoglobin: 12.9 g/dL — ABNORMAL LOW (ref 13.0–18.0)
MCH: 30 pg (ref 26.0–34.0)
MCHC: 33.5 g/dL (ref 32.0–36.0)
MCV: 89.4 fL (ref 80.0–100.0)
PLATELETS: 124 10*3/uL — AB (ref 150–440)
RBC: 4.32 MIL/uL — ABNORMAL LOW (ref 4.40–5.90)
RDW: 15.8 % — ABNORMAL HIGH (ref 11.5–14.5)
WBC: 9.5 10*3/uL (ref 3.8–10.6)

## 2016-05-22 LAB — BASIC METABOLIC PANEL
Anion gap: 10 (ref 5–15)
BUN: 57 mg/dL — ABNORMAL HIGH (ref 6–20)
CALCIUM: 8.5 mg/dL — AB (ref 8.9–10.3)
CO2: 21 mmol/L — AB (ref 22–32)
CREATININE: 3.83 mg/dL — AB (ref 0.61–1.24)
Chloride: 108 mmol/L (ref 101–111)
GFR, EST AFRICAN AMERICAN: 17 mL/min — AB (ref >60)
GFR, EST NON AFRICAN AMERICAN: 14 mL/min — AB (ref >60)
GLUCOSE: 95 mg/dL (ref 65–99)
Potassium: 4.4 mmol/L (ref 3.5–5.1)
Sodium: 139 mmol/L (ref 135–145)

## 2016-05-22 MED ORDER — CEFTRIAXONE SODIUM-DEXTROSE 1-3.74 GM-% IV SOLR
1.0000 g | Freq: Every day | INTRAVENOUS | Status: DC
  Administered 2016-05-22 – 2016-05-24 (×3): 1 g via INTRAVENOUS
  Filled 2016-05-22 (×3): qty 50

## 2016-05-22 NOTE — Progress Notes (Signed)
PT Cancellation Note  Patient Details Name: [name] DarbyJerry L Witter MRN: [id number] DOB: 12/22/1940   Cancelled Treatment:    Reason Eval/Treat Not Completed: Patient at procedure or test/unavailable (Chart reviewed, RN consulted. Pt off floor for imaging study at this time. Will attempt again at later date/time. )   1:07 PM, 05/22/16 Rosamaria LintsAllan C Buccola, PT, DPT Physical Therapist - [place] [phone](ASCOM)  [phone] (mobile)

## 2016-05-22 NOTE — Progress Notes (Addendum)
Patient ID: [name], male   DOB: 09/18/1940, 75 y.o.   MRN: [id number]  [employer] PROGRESS NOTE  [name] OZD:[id number] DOB: 09/18/1940 DOA: 05/21/2016 PCP: Lorenda Ishiharaupashree Varadarajan, MD  HPI/Subjective: Patient still having a little back pain. Pain in the waist is better. Breathing is better. Still having some pain in his legs.  Objective: Vitals:   05/22/16 0554 05/22/16 0924  BP: 129/61 (!) 144/80  Pulse: 95 96  Resp: (!) 24 20  Temp: (!) 100.5 F (38.1 C) 98.4 F (36.9 C)    Filed Weights   05/21/16 2114  Weight: 117.9 kg (260 lb)    ROS: Review of Systems  Constitutional: Negative for chills and fever.  Eyes: Negative for blurred vision.  Respiratory: Positive for shortness of breath. Negative for cough.   Cardiovascular: Negative for chest pain.  Gastrointestinal: Positive for abdominal pain. Negative for constipation, diarrhea, nausea and vomiting.  Musculoskeletal: Positive for back pain. Negative for joint pain.  Neurological: Negative for dizziness and headaches.   Exam: Physical Exam  HENT:  Nose: No mucosal edema.  Mouth/Throat: No oropharyngeal exudate or posterior oropharyngeal edema.  Eyes: Conjunctivae, EOM and lids are normal. Pupils are equal, round, and reactive to light.  Neck: No JVD present. Carotid bruit is not present. No edema present. No thyroid mass and no thyromegaly present.  Cardiovascular: S1 normal and S2 normal.  Exam reveals no gallop.   Murmur heard.  Systolic murmur is present with a grade of 4/6  Pulses:      Dorsalis pedis pulses are 2+ on the right side, and 2+ on the left side.  Respiratory: No respiratory distress. He has decreased breath sounds in the right lower field and the left lower field. He has no wheezes. He has no rhonchi. He has no rales.  GI: Soft. Bowel sounds are normal. There is no tenderness.  Musculoskeletal:       Right ankle: He exhibits swelling.       Left ankle: He exhibits swelling.   Patient not able to straight leg raise on his own. When I passively raise his leg he is able to hold his leg up off the bed after that.  Lymphadenopathy:    He has no cervical adenopathy.  Neurological: He is alert. No cranial nerve deficit.  Skin: Skin is warm. Nails show no clubbing.  Bilateral lower extremity erythema.  Psychiatric: He has a normal mood and affect.      Data Reviewed: Basic Metabolic Panel:  Recent Labs Lab 05/21/16 1756 05/22/16 0358  NA 139 139  K 4.7 4.4  CL 106 108  CO2 23 21*  GLUCOSE 103* 95  BUN 64* 57*  CREATININE 4.90* 3.83*  CALCIUM 8.8* 8.5*   Liver Function Tests:  Recent Labs Lab 05/21/16 1756  AST 19  ALT 8*  ALKPHOS 102  BILITOT 0.8  PROT 7.8  ALBUMIN 3.6    Recent Labs Lab 05/21/16 1756  LIPASE 41   CBC:  Recent Labs Lab 05/21/16 1756 05/22/16 0358  WBC 10.1 9.5  NEUTROABS 8.3*  --   HGB 12.5* 12.9*  HCT 37.6* 38.6*  MCV 87.8 89.4  PLT 172 124*   Cardiac Enzymes:  Recent Labs Lab 05/21/16 1756  TROPONINI 0.03*   BNP (last 3 results)  Recent Labs  05/21/16 1756  BNP 92.0     Recent Results (from the past 240 hour(s))  Culture, blood (routine x 2)     Status: None (Preliminary result)  Collection Time: 05/21/16  5:56 PM  Result Value Ref Range Status   Specimen Description BLOOD RIGHT ANTECUBITAL  Final   Special Requests BOTTLES DRAWN AEROBIC AND ANAEROBIC  10CC  Final   Culture NO GROWTH < 12 HOURS  Final   Report Status PENDING  Incomplete  Culture, blood (routine x 2)     Status: None (Preliminary result)   Collection Time: 05/21/16  6:50 PM  Result Value Ref Range Status   Specimen Description BLOOD RIGHT HAND  Final   Special Requests BOTTLES DRAWN AEROBIC AND ANAEROBIC  1CC  Final   Culture NO GROWTH < 12 HOURS  Final   Report Status PENDING  Incomplete     Studies: Ct Head Wo Contrast  Result Date: 05/21/2016 CLINICAL DATA:  Pain after fall. EXAM: CT HEAD WITHOUT CONTRAST  TECHNIQUE: Contiguous axial images were obtained from the base of the skull through the vertex without intravenous contrast. COMPARISON:  February 21, 2015 FINDINGS: Brain: No subdural, epidural, or subarachnoid hemorrhage. No mass, mass effect, or midline shift. Ventricles and sulci are stable. The cerebellum is normal. There is a small lacunar infarct is seen in the pons, unchanged and chronic in appearance. Basal cisterns are widely patent. No mass, mass effect, or midline shift. Scattered lacunar infarcts in the basal ganglia. Moderate to severe white matter changes, stable. No acute cortical ischemia or infarct. Vascular: Calcified atherosclerosis is seen in the intracranial carotid arteries. Skull: Normal. Negative for fracture or focal lesion. Sinuses/Orbits: There is debris in the right sphenoid sinus. The remainder of the paranasal sinuses and the left mastoid air cells are normal. The middle ears are well aerated. There is opacification of the inferior right mastoid air cells with associated sclerosis, unchanged. This likely represents a region of previous mastoiditis. No adjacent soft tissue swelling or erosion to suggest an acute process. Other: Extracranial soft tissues are normal. IMPRESSION: 1. No acute intracranial process. 2. Debris in the right sphenoid sinus. Opacification and sclerosis in the inferior right mastoid air cells, chronic, of no acute significance. Electronically Signed   By: Gerome Sam III M.D   On: 05/21/2016 18:49   Dg Chest Port 1 View  Result Date: 05/21/2016 CLINICAL DATA:  Patient's slipped out of bed and hit right side of head. Dizziness with bilateral lower leg edema. History of CHF. EXAM: PORTABLE CHEST 1 VIEW COMPARISON:  02/21/2015. FINDINGS: 1812 hours. The lungs are clear wiithout focal pneumonia, edema, pneumothorax or pleural effusion. Cardiopericardial silhouette is at upper limits of normal for size. The visualized bony structures of the thorax are intact.  Telemetry leads overlie the chest. IMPRESSION: No active disease. Electronically Signed   By: Kennith Center M.D.   On: 05/21/2016 18:32    Scheduled Meds: . ARIPiprazole  20 mg Oral Daily  . aspirin  81 mg Oral Daily  . buPROPion  300 mg Oral BH-q7a  . carvedilol  6.25 mg Oral BID  . cefTRIAXone (ROCEPHIN)  IV  1 g Intravenous Q24H  . cholecalciferol  1,000 Units Oral Daily  . finasteride  5 mg Oral Daily  . heparin  5,000 Units Subcutaneous Q8H  . mouth rinse  15 mL Mouth Rinse BID  . pantoprazole  40 mg Oral Daily  . PHENobarbital  129.6 mg Oral QODAY  . pravastatin  40 mg Oral Daily  . tamsulosin  0.8 mg Oral QPC supper   Continuous Infusions: . sodium chloride 50 mL/hr at 05/22/16 0931    Assessment/Plan:  1.  Clinical sepsis with fever, tachycardia and acute cystitis with hematuria. IV Rocephin should cover. Follow-up urine culture. So far blood cultures are negative. 2. Acute kidney injury which is postobstructive in nature. Bladder scan in the ER ordered by me showed a greater than 999 in the bladder. Once Foley catheter was placed 3 L drained out. Kidney function should improve. 3. BPH with obstruction. I changed Terazosin to high-dose Flomax. Continue finasteride. Foley will have to stay in at least a week. Will need urology as outpatient. 4. Acute respiratory distress on presentation likely from sepsis. Patient required BiPAP initially but is now breathing comfortably on 1 L of oxygen 5. Essential hypertension on Coreg 6. Lower extremity edema. Hopefully we'll be able to continue Lasix once acute renal failure improves 7. Bipolar disorder. Continue psychiatric medications 8. 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 Weakness. Physical therapy evaluation 10. Social worker to see the patient about his placement issues 11. 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 This is not a cellulitis.  Code Status:     Code Status Orders         Start     Ordered   05/21/16 1937  Full code  Continuous     05/21/16 1936    Code Status History    Date Active Date Inactive Code Status Order ID Comments User Context   04/23/2016  4:22 PM 04/24/2016 12:58 PM Full Code [id number]  Auburn Bilberry, MD ED   02/21/2015  3:37 PM 02/24/2015  4:42 PM Full Code [id number]  Shaune Pollack, MD Inpatient      Disposition Plan: Will be in the hospital until creatinine improves  Antibiotics:  Rocephin  Time spent: 25 minutes  Alford Highland  [employer]

## 2016-05-23 LAB — BASIC METABOLIC PANEL
Anion gap: 9 (ref 5–15)
BUN: 47 mg/dL — AB (ref 6–20)
CHLORIDE: 112 mmol/L — AB (ref 101–111)
CO2: 20 mmol/L — AB (ref 22–32)
CREATININE: 2.15 mg/dL — AB (ref 0.61–1.24)
Calcium: 8.2 mg/dL — ABNORMAL LOW (ref 8.9–10.3)
GFR calc Af Amer: 33 mL/min — ABNORMAL LOW (ref >60)
GFR calc non Af Amer: 29 mL/min — ABNORMAL LOW (ref >60)
Glucose, Bld: 112 mg/dL — ABNORMAL HIGH (ref 65–99)
Potassium: 4.9 mmol/L (ref 3.5–5.1)
SODIUM: 141 mmol/L (ref 135–145)

## 2016-05-23 NOTE — Progress Notes (Signed)
Patient ID: [name] DarbyJerry L Nephew, male   DOB: 1940/11/08, 75 y.o.   MRN: [id number]  [facility] PROGRESS NOTE  [name] DarbyJerry L Mallery WUJ:[id number] DOB: 1940/11/08 DOA: 05/21/2016 PCP: Lorenda Ishiharaupashree Varadarajan, MD  HPI/Subjective: Patient states that his legs are swollen.  Patient still having low-grade temperature.  Still has a little left low back pain.  Objective: Vitals:   05/23/16 0540 05/23/16 0845  BP: 119/77 126/63  Pulse: 89 74  Resp: 20 18  Temp: (!) 100.4 F (38 C) (!) 100.5 F (38.1 C)    Filed Weights   05/21/16 2114  Weight: 117.9 kg (260 lb)    ROS: Review of Systems  Constitutional: Negative for chills and fever.  Eyes: Negative for blurred vision.  Respiratory: Positive for shortness of breath. Negative for cough.   Cardiovascular: Negative for chest pain.  Gastrointestinal: Negative for abdominal pain, constipation, diarrhea, nausea and vomiting.  Musculoskeletal: Positive for back pain. Negative for joint pain.  Neurological: Negative for dizziness and headaches.   Exam: Physical Exam  HENT:  Nose: No mucosal edema.  Mouth/Throat: No oropharyngeal exudate or posterior oropharyngeal edema.  Eyes: Conjunctivae, EOM and lids are normal. Pupils are equal, round, and reactive to light.  Neck: No JVD present. Carotid bruit is not present. No edema present. No thyroid mass and no thyromegaly present.  Cardiovascular: S1 normal and S2 normal.  Exam reveals no gallop.   Murmur heard.  Systolic murmur is present with a grade of 4/6  Pulses:      Dorsalis pedis pulses are 2+ on the right side, and 2+ on the left side.  Respiratory: No respiratory distress. He has decreased breath sounds in the right lower field and the left lower field. He has no wheezes. He has no rhonchi. He has no rales.  GI: Soft. Bowel sounds are normal. There is no tenderness.  Musculoskeletal:       Right ankle: He exhibits swelling.       Left ankle: He exhibits swelling.  Patient not able  to straight leg raise on his own. When I passively raise his leg he is able to hold his leg up off the bed after that.  Lymphadenopathy:    He has no cervical adenopathy.  Neurological: He is alert. No cranial nerve deficit.  Skin: Skin is warm. Nails show no clubbing.  Bilateral lower extremity erythema.  Psychiatric: He has a normal mood and affect.      Data Reviewed: Basic Metabolic Panel:  Recent Labs Lab 05/21/16 1756 05/22/16 0358 05/23/16 0353  NA 139 139 141  K 4.7 4.4 4.9  CL 106 108 112*  CO2 23 21* 20*  GLUCOSE 103* 95 112*  BUN 64* 57* 47*  CREATININE 4.90* 3.83* 2.15*  CALCIUM 8.8* 8.5* 8.2*   Liver Function Tests:  Recent Labs Lab 05/21/16 1756  AST 19  ALT 8*  ALKPHOS 102  BILITOT 0.8  PROT 7.8  ALBUMIN 3.6    Recent Labs Lab 05/21/16 1756  LIPASE 41   CBC:  Recent Labs Lab 05/21/16 1756 05/22/16 0358  WBC 10.1 9.5  NEUTROABS 8.3*  --   HGB 12.5* 12.9*  HCT 37.6* 38.6*  MCV 87.8 89.4  PLT 172 124*   Cardiac Enzymes:  Recent Labs Lab 05/21/16 1756  TROPONINI 0.03*   BNP (last 3 results)  Recent Labs  05/21/16 1756  BNP 92.0     Recent Results (from the past 240 hour(s))  Culture, blood (routine x 2)  Status: None (Preliminary result)   Collection Time: 05/21/16  5:56 PM  Result Value Ref Range Status   Specimen Description BLOOD RIGHT ANTECUBITAL  Final   Special Requests BOTTLES DRAWN AEROBIC AND ANAEROBIC  10CC  Final   Culture NO GROWTH 2 DAYS  Final   Report Status PENDING  Incomplete  Urine culture     Status: Abnormal (Preliminary result)   Collection Time: 05/21/16  5:56 PM  Result Value Ref Range Status   Specimen Description URINE, RANDOM  Final   Special Requests NONE  Final   Culture 80,000 COLONIES/mL GRAM NEGATIVE RODS (A)  Final   Report Status PENDING  Incomplete  Culture, blood (routine x 2)     Status: None (Preliminary result)   Collection Time: 05/21/16  6:50 PM  Result Value Ref Range  Status   Specimen Description BLOOD RIGHT HAND  Final   Special Requests BOTTLES DRAWN AEROBIC AND ANAEROBIC  1CC  Final   Culture NO GROWTH 2 DAYS  Final   Report Status PENDING  Incomplete     Studies: Ct Head Wo Contrast  Result Date: 05/21/2016 CLINICAL DATA:  Pain after fall. EXAM: CT HEAD WITHOUT CONTRAST TECHNIQUE: Contiguous axial images were obtained from the base of the skull through the vertex without intravenous contrast. COMPARISON:  February 21, 2015 FINDINGS: Brain: No subdural, epidural, or subarachnoid hemorrhage. No mass, mass effect, or midline shift. Ventricles and sulci are stable. The cerebellum is normal. There is a small lacunar infarct is seen in the pons, unchanged and chronic in appearance. Basal cisterns are widely patent. No mass, mass effect, or midline shift. Scattered lacunar infarcts in the basal ganglia. Moderate to severe white matter changes, stable. No acute cortical ischemia or infarct. Vascular: Calcified atherosclerosis is seen in the intracranial carotid arteries. Skull: Normal. Negative for fracture or focal lesion. Sinuses/Orbits: There is debris in the right sphenoid sinus. The remainder of the paranasal sinuses and the left mastoid air cells are normal. The middle ears are well aerated. There is opacification of the inferior right mastoid air cells with associated sclerosis, unchanged. This likely represents a region of previous mastoiditis. No adjacent soft tissue swelling or erosion to suggest an acute process. Other: Extracranial soft tissues are normal. IMPRESSION: 1. No acute intracranial process. 2. Debris in the right sphenoid sinus. Opacification and sclerosis in the inferior right mastoid air cells, chronic, of no acute significance. Electronically Signed   By: Gerome Sam III M.D   On: 05/21/2016 18:49   US Renal  Result Date: 05/22/2016 CLINICAL DATA:  Acute kidney injury. EXAM: RENAL / URINARY TRACT ULTRASOUND COMPLETE COMPARISON:  CT scan the  abdomen pelvis - 02/21/2015 FINDINGS: Examination is degraded due to patient body habitus and poor sonographic window. Right Kidney: The right kidney is normal in size measuring 11.7 cm in length. Note is made of mild diffuse cortical thinning with preservation of cortical echogenicity. Note is made of an approximately 2.0 x 2.0 x 2.2 cm anechoic exophytic lesion arising from the mid aspect of the right kidney, similar to abdominal CT performed 02/21/2015 compatible with a renal cyst. This lesion is noted to contain a thin internal echogenic septation. Note is made of an approximately 1.2 x note is made of a approximately 0.9 cm echogenic shadowing calcification within the superior pole the right kidney compatible with nonobstructing stones seen on CT scan the abdomen pelvis performed 02/2015. No urinary obstruction. Left Kidney: Left kidney is normal in size measuring 9.9 cm  in length with slight apparent discrepancy to the contralateral right kidney favored to be artifactual due to scan plane projection as both kidneys were seen to be of normal size on contrast-enhanced abdominal CT performed 02/2015. There is mild diffuse cortical thinning with preservation of cortical echogenicity. Note is made of an approximately 3.3 x 3.1 x 3.1 cm anechoic cyst within the interpolar aspect of the left kidney. No echogenic renal stones. No urinary obstruction. Bladder: Decompressed with a Foley catheter. IMPRESSION: 1. Mild bilateral cortical thinning suggestive of medical renal disease. Otherwise, no explanation for patient's acute kidney injury. Specifically, no evidence of urinary obstruction. 2. Unchanged punctate (approximately 0.9 cm) nonobstructing stone within the superior pole the right kidney, similar to abdominal CT performed 02/2015. 3. Grossly unchanged bilateral renal cysts. Electronically Signed   By: Simonne Come M.D.   On: 05/22/2016 14:44   Dg Chest Port 1 View  Result Date: 05/21/2016 CLINICAL DATA:   Patient's slipped out of bed and hit right side of head. Dizziness with bilateral lower leg edema. History of CHF. EXAM: PORTABLE CHEST 1 VIEW COMPARISON:  02/21/2015. FINDINGS: 1812 hours. The lungs are clear wiithout focal pneumonia, edema, pneumothorax or pleural effusion. Cardiopericardial silhouette is at upper limits of normal for size. The visualized bony structures of the thorax are intact. Telemetry leads overlie the chest. IMPRESSION: No active disease. Electronically Signed   By: Kennith Center M.D.   On: 05/21/2016 18:32    Scheduled Meds: . ARIPiprazole  20 mg Oral Daily  . aspirin  81 mg Oral Daily  . buPROPion  300 mg Oral BH-q7a  . carvedilol  6.25 mg Oral BID  . cefTRIAXone  1 g Intravenous q1800  . cholecalciferol  1,000 Units Oral Daily  . finasteride  5 mg Oral Daily  . heparin  5,000 Units Subcutaneous Q8H  . mouth rinse  15 mL Mouth Rinse BID  . pantoprazole  40 mg Oral Daily  . PHENobarbital  129.6 mg Oral QODAY  . pravastatin  40 mg Oral Daily  . tamsulosin  0.8 mg Oral QPC supper    Assessment/Plan:  1. Clinical sepsis with fever, tachycardia and acute cystitis with hematuria.  IV Rocephin should cover.  Urine culture with gram-negative rods. So far blood cultures are negative. 2. Acute kidney injury which is postobstructive in nature. Bladder scan in the ER ordered by me showed a greater than 999 in the bladder. Once Foley catheter was placed 3 L drained out.  Creatinine improved today to 2.15. 3. BPH with obstruction. I changed Terazosin to high-dose Flomax. Continue finasteride. Foley will have to stay in at least a week. Will need urology as outpatient. 4. Acute respiratory distress on presentation likely from sepsis. Patient required BiPAP initially but is now breathing comfortably on off oxygen. 5. Essential hypertension on Coreg 6. Lower extremity edema. Hopefully we'll be able to continue Lasix once acute renal failure improves 7. Bipolar disorder. Continue  psychiatric medications 8. 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 Weakness. Physical therapy evaluation 10. Social worker to see the patient about his placement issues 11. 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 This is not a cellulitis.  Code Status:     Code Status Orders        Start     Ordered   05/21/16 1937  Full code  Continuous     05/21/16 1936    Code Status History    Date Active Date Inactive Code  Status Order ID Comments User Context   04/23/2016  4:22 PM 04/24/2016 12:58 PM Full Code [id number]  Auburn Bilberry, MD ED   02/21/2015  3:37 PM 02/24/2015  4:42 PM Full Code [id number]  Shaune Pollack, MD Inpatient      Disposition Plan: Will be in the hospital until creatinine improves and afebrile  Antibiotics:  Rocephin  Time spent: 24 minutes  Alford Highland  [employer]

## 2016-05-23 NOTE — [place] FL2 (Addendum)
[place] MEDICAID FL2 LEVEL OF CARE SCREENING TOOL     IDENTIFICATION  Patient Name: [name] Birthdate: Aug 20, 1940 Sex: male Admission Date (Current Location): 05/21/2016  [name]   [name] [id number] [employer]  Facility and Address:  [facility], [address], [place], [place] [address]      Provider Number: [id number]  Attending Physician Name and Address:  Alford Highlandichard Wieting, MD  Relative Name and Phone Number:              Current Level of Care: Hospital Recommended Level of Care: SNF Prior Approval Number:    Date Approved/Denied:   PASRR Number:    Discharge Plan: SNF    Current Diagnoses: Patient Active Problem List   Diagnosis Date Noted  . Acute kidney injury (HCC) 05/21/2016  . UTI (lower urinary tract infection) 04/23/2016  . 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 Pneumonia 02/21/2015  . ARF (acute renal failure) (HCC) 02/21/2015  . Hypokalemia 02/21/2015  . Leukocytosis 02/21/2015    Orientation RESPIRATION BLADDER Height & Weight     Self, Time, Situation, Place  O2 (Intermittent O2 use PRN 2 L currently. Will wean.) Continent Weight: 260 lb (117.9 kg) Height:  5\' 10"  (177.8 cm)  BEHAVIORAL SYMPTOMS/MOOD NEUROLOGICAL BOWEL NUTRITION STATUS      Continent    AMBULATORY STATUS COMMUNICATION OF NEEDS Skin   Extensive Assist Verbally Bruising                       Personal Care Assistance Level of Assistance  Bathing, Dressing Bathing Assistance: Limited assistance   Dressing Assistance: Limited assistance     Functional Limitations Info             SPECIAL CARE FACTORS FREQUENCY   Physical Therapy                      Contractures Contractures Info: Present    Additional Factors Info  Allergies      Isolation precautions   Allergies Info: Penicillin G    CDiff             Current Medications (05/23/2016):  This is  the current hospital active medication list Current Facility-Administered Medications  Medication Dose Route Frequency Provider Last Rate Last Dose  . acetaminophen (TYLENOL) tablet 650 mg  650 mg Oral Q6H PRN Alford Highlandichard Wieting, MD   650 mg at 05/23/16 [id number]   Or  . acetaminophen (TYLENOL) suppository 650 mg  650 mg Rectal Q6H PRN Alford Highlandichard Wieting, MD      . albuterol (PROVENTIL) (2.5 MG/3ML) 0.083% nebulizer solution 2.5 mg  2.5 mg Inhalation Q6H PRN Alford Highlandichard Wieting, MD      . ARIPiprazole (ABILIFY) tablet 20 mg  20 mg Oral Daily Alford Highlandichard Wieting, MD   20 mg at 05/23/16 0948  . aspirin chewable tablet 81 mg  81 mg Oral Daily Alford Highlandichard Wieting, MD   81 mg at 05/23/16 0948  . buPROPion (WELLBUTRIN XL) 24 hr tablet 300 mg  300 mg Oral [name] Richard Wieting, MD   300 mg at 05/23/16 0607  . carvedilol (COREG) tablet 6.25 mg  6.25 mg Oral BID Alford Highlandichard Wieting, MD   6.25 mg at 05/23/16 0948  . cefTRIAXone (ROCEPHIN) IVPB 1 g  1 g Intravenous q1800 Alford Highlandichard Wieting, MD   1 g at 05/22/16 1849  . cholecalciferol (VITAMIN D) tablet 1,000 Units  1,000 Units  Oral Daily Alford Highland, MD   1,000 Units at 05/23/16 [phone]  . finasteride (PROSCAR) tablet 5 mg  5 mg Oral Daily Alford Highland, MD   5 mg at 05/23/16 0948  . heparin injection 5,000 Units  5,000 Units Subcutaneous Q8H Alford Highland, MD   5,000 Units at 05/23/16 0606  . MEDLINE mouth rinse  15 mL Mouth Rinse BID Alford Highland, MD      . pantoprazole (PROTONIX) EC tablet 40 mg  40 mg Oral Daily Alford Highland, MD   40 mg at 05/23/16 0948  . PHENobarbital (LUMINAL) tablet 129.6 mg  129.6 mg Oral [name], MD   129.6 mg at 05/22/16 0936  . polyethylene glycol (MIRALAX / GLYCOLAX) packet 17 g  17 g Oral Daily PRN Alford Highland, MD      . pravastatin (PRAVACHOL) tablet 40 mg  40 mg Oral Daily Alford Highland, MD   40 mg at 05/23/16 0948  . tamsulosin (FLOMAX) capsule 0.8 mg  0.8 mg Oral QPC supper Alford Highland, MD   0.8 mg at 05/22/16 1802      Discharge Medications: Please see discharge summary for a list of discharge medications.  Relevant Imaging Results:  Relevant Lab Results:   Additional Information  SS [id number]  [name], LCSW  [name], MSW [phone]  Mon-Fri 8a-4:30p 05/24/2016 10:17 AM Updated 05-24-16
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
## 2016-05-23 NOTE — Evaluation (Signed)
Physical Therapy Evaluation Patient Details Name: [name] DarbyJerry L [name] MRN: [id number] DOB: [birth year] Today's Date: 05/23/2016   History of Present Illness  [name] SeamenJerry Markwardt is a 75yo male [facility] after sustaining a fall at his facility. CC: low back pain and BLE pain, noted to have AKI. At baseline he resides at ALF, using a WC for primary mobility and intermittent ability to perform bed mobility/transfers at min-maxA variable performance. PMH includes bilpolar disorder.   Clinical Impression  Upon entry, the patient is received semirecumbent in bed, heavily asleep and remaining somewhat lethargic throughout. The pt is agreeable to participate. No acute distress noted at this time, although the patient does not offer any spontaneous conversation. VSS during eval. The pt is hypoactive, pleasant, conversational, and following simple commands consistently. Pt received on and remaining on 2L O2 throughout evaluation, with noted saturation of >89% after moving pt to room air, whereas pt is not on O2 at baseline at home. Functional mobility assessment demonstrates severe weakness, the pt now requiring total-assist +2 physical assistance for bed mobility, transfers, and gait, whereas the patient performs these at min-maxA +1 at baseline.    Patient presenting with impairment of strength, balance, and activity tolerance, limiting ability to perform ADL and mobility tasks at  baseline level of function. Patient will benefit from skilled intervention to address the above impairments and limitations, in order to restore to prior level of function, improve patient safety upon discharge, and to decrease falls risk.       Follow Up Recommendations Home health PT;Supervision for mobility/OOB;Supervision - Intermittent (return to ALF; )    Equipment Recommendations       Recommendations for Other Services       Precautions / Restrictions Precautions Precautions: Fall      Mobility  Bed  Mobility Overal bed mobility: +2 for physical assistance;Needs Assistance Bed Mobility: Supine to Sit     Supine to sit: +2 for physical assistance;Total assist        Transfers Overall transfer level: Needs assistance   Transfers: Sit to/from Stand;Stand Pivot Transfers Sit to Stand: Total assist;+2 physical assistance Stand pivot transfers: Total assist;+2 physical assistance       General transfer comment: Attempted walker, which was not successful.   Ambulation/Gait Ambulation/Gait assistance:  (not appropriate at this time. )              Stairs            Wheelchair Mobility    Modified Rankin (Stroke Patients Only)       Balance Overall balance assessment: History of Falls;Needs assistance Sitting-balance support: Bilateral upper extremity supported Sitting balance-Leahy Scale: Poor     Standing balance support: Bilateral upper extremity supported;During functional activity Standing balance-Leahy Scale: Zero                               Pertinent Vitals/Pain Pain Assessment: 0-10 Pain Score: 6  Pain Location: pelvis, back, and legs  Pain Descriptors / Indicators: Aching Pain Intervention(s): Limited activity within patient's tolerance;Monitored during session;Repositioned;Premedicated before session    Home Living Family/patient expects to be discharged to:: Assisted living               Home Equipment: Walker - 2 wheels;Wheelchair - manual      Prior Function           Comments: Pt did not walk more than 14800ft during  last admission . 4 weeks ago, pt reporting he is able to dress, walk to dining room, etc. Today he reports he has been using WC more for mobility and requiring mod-maxA for bed mobility and trafers.      Hand Dominance        Extremity/Trunk Assessment   Upper Extremity Assessment: Generalized weakness           Lower Extremity Assessment: Generalized weakness (heavy LEE and redness  bilat, worse once feet are on the floor for a few minutes. )         Communication      Cognition Arousal/Alertness: Lethargic Behavior During Therapy: Flat affect Overall Cognitive Status: Difficult to assess                      General Comments      Exercises     Assessment/Plan    PT Assessment Patient needs continued PT services  PT Problem List Decreased strength;Decreased cognition;Decreased range of motion;Decreased activity tolerance;Decreased balance;Decreased mobility;Decreased coordination;Cardiopulmonary status limiting activity;Decreased safety awareness;Obesity          PT Treatment Interventions Gait training;Functional mobility training;Therapeutic activities;Therapeutic exercise;Balance training;Patient/family education    PT Goals (Current goals can be found in the Care Plan section)  Acute Rehab PT Goals PT Goal Formulation: Patient unable to participate in goal setting    Frequency Min 2X/week   Barriers to discharge        Co-evaluation               End of Session Equipment Utilized During Treatment: Gait belt;Oxygen Activity Tolerance: Patient limited by fatigue;Patient limited by lethargy;Patient tolerated treatment well Patient left: in chair;with call bell/phone within reach;with nursing/sitter in room Nurse Communication: Mobility status;Other (comment)         Time: 9147-8295 PT Time Calculation (min) (ACUTE ONLY): 28 min   Charges:   PT Evaluation $PT Eval Moderate Complexity: 1 Procedure PT Treatments $Therapeutic Activity: 8-22 mins   PT G Codes:        1:18 PM, May 29, 2016 Rosamaria Lints, PT, DPT Physical Therapist - [place] [phone] (mobile)

## 2016-05-23 NOTE — Progress Notes (Signed)
Initial Nutrition Assessment  DOCUMENTATION CODES:   Severe malnutrition in context of chronic illness  INTERVENTION:  -Whole Milk w/ trays  NUTRITION DIAGNOSIS:   Malnutrition related to acute illness as evidenced by percent weight loss, severe fluid accumulation.  GOAL:   Patient will meet greater than or equal to 90% of their needs  MONITOR:   PO intake, I & O's, Labs, Weight trends  REASON FOR ASSESSMENT:   Malnutrition Screening Tool    ASSESSMENT:   [name]  is a 75 y.o. male with a known history of Bipolar disorder. He has recently been in the hospital and was seen for acute renal failure and lower extremity edema. He had a fall at his facility and EMS thought he was breathing hard and put him on BiPAP  Spoke w/ [name] briefly. He demonstrates flat effect. States poor PO PTA --> "Just not hungry," likely related to bipolar disorder. He endorses wt loss over 296-260 over the past 3 weeks or so. Per chart review, pt was 283# when he [facility]  Demonstrates a 23#/8% severe wt loss over 5 days. Nutrition-Focused physical exam completed. Findings are no fat depletion, no muscle depletion, and severe edema.  Did not consume much of his tray this morning. Labs and medications reviewed: Vit D  Diet Order:  Diet Heart Room service appropriate? Yes; Fluid consistency: Thin  Skin:  Reviewed, no issues  Last BM:  PTA  Height:   Ht Readings from Last 1 Encounters:  05/21/16 5\' 10"  (1.778 m)    Weight:   Wt Readings from Last 1 Encounters:  05/21/16 260 lb (117.9 kg)    Ideal Body Weight:  75.45 kg  BMI:  Body mass index is 37.31 kg/m.  Estimated Nutritional Needs:   Kcal:  1550-1850 calories  Protein:  90-115 gm  Fluid:  >/= 1.6L  EDUCATION NEEDS:   No education needs identified at this time  Dionne AnoWilliam M. Adam Demary, MS, RD LDN Inpatient Clinical Dietitian Pager [phone]

## 2016-05-23 NOTE — Care Management Important Message (Signed)
Important Message  Patient Details  Name: [name] MRN: [id number] Date of Birth: 10/05/1940   Medicare Important Message Given:  Yes    Lorelle Macaluso A, RN 05/23/2016, 2:53 PM

## 2016-05-24 ENCOUNTER — Inpatient Hospital Stay: Payer: Medicare Other

## 2016-05-24 LAB — BASIC METABOLIC PANEL
Anion gap: 8 (ref 5–15)
BUN: 40 mg/dL — ABNORMAL HIGH (ref 6–20)
CHLORIDE: 111 mmol/L (ref 101–111)
CO2: 20 mmol/L — ABNORMAL LOW (ref 22–32)
Calcium: 8.2 mg/dL — ABNORMAL LOW (ref 8.9–10.3)
Creatinine, Ser: 1.47 mg/dL — ABNORMAL HIGH (ref 0.61–1.24)
GFR calc non Af Amer: 46 mL/min — ABNORMAL LOW (ref >60)
GFR, EST AFRICAN AMERICAN: 53 mL/min — AB (ref >60)
Glucose, Bld: 102 mg/dL — ABNORMAL HIGH (ref 65–99)
POTASSIUM: 3.4 mmol/L — AB (ref 3.5–5.1)
SODIUM: 139 mmol/L (ref 135–145)

## 2016-05-24 LAB — C DIFFICILE QUICK SCREEN W PCR REFLEX
C DIFFICILE (CDIFF) TOXIN: NEGATIVE
C DIFFICLE (CDIFF) ANTIGEN: POSITIVE — AB

## 2016-05-24 MED ORDER — SODIUM CHLORIDE 0.9% FLUSH
3.0000 mL | Freq: Two times a day (BID) | INTRAVENOUS | Status: DC
  Administered 2016-05-24 – 2016-05-27 (×7): 3 mL via INTRAVENOUS

## 2016-05-24 MED ORDER — APIXABAN 5 MG PO TABS
10.0000 mg | ORAL_TABLET | Freq: Two times a day (BID) | ORAL | Status: DC
  Administered 2016-05-24 – 2016-05-27 (×8): 10 mg via ORAL
  Filled 2016-05-24 (×8): qty 2

## 2016-05-24 MED ORDER — APIXABAN 5 MG PO TABS: 5.0000 mg | ORAL_TABLET | Freq: Two times a day (BID) | ORAL | Status: DC

## 2016-05-24 NOTE — Clinical Social Work Note (Signed)
MSW spoke to [name] [phone] at [facility] and she can accept patient back to group home once he is medically ready for discharge and orders have been received.  Ervin KnackEric R. Solveig Fangman, MSW [phone]  Mon-Fri 8a-4:30p 05/24/2016 1:27 PM

## 2016-05-24 NOTE — Progress Notes (Signed)
Called Dr. Renae GlossWieting about venous ultrasound being positive for bilateral DVTs.  He acknowledged and said he would place some orders.

## 2016-05-24 NOTE — Progress Notes (Signed)
Called Dr. Winona LegatoVaickute to let her know that the patient has had 3-4 loose bowel movements today.  She gave a verbal order for CDiff and GI panel.

## 2016-05-24 NOTE — Progress Notes (Signed)
ANTICOAGULATION CONSULT NOTE - Initial Consult  Pharmacy Consult for Apixaban Indication: DVT  Allergies  Allergen Reactions  . Penicillin G Other (See Comments)    Convulsions. Has patient had a PCN reaction causing immediate rash, facial/tongue/throat swelling, SOB or lightheadedness with hypotension: not sure Has patient had a PCN reaction causing severe rash involving mucus membranes or skin necrosis: not sure Has patient had a PCN reaction that required hospitalization: not sure Has patient had a PCN reaction occurring within the last 10 years: not sure If all of the above answers are "NO", then may proceed with Cephalosporin use.    Patient Measurements: Height: 5\' 10"  (177.8 cm) Weight: 260 lb (117.9 kg) IBW/kg (Calculated) : 73 Heparin Dosing Weight:    Vital Signs: Temp: 99.4 F (37.4 C) (10/16 0901) Temp Source: Oral (10/16 0901) BP: 157/79 (10/16 0901) Pulse Rate: 82 (10/16 0901)  Labs:  Recent Labs  05/21/16 1756 05/22/16 0358 05/23/16 0353 05/24/16 0416  HGB 12.5* 12.9*  --   --   HCT 37.6* 38.6*  --   --   PLT 172 124*  --   --   LABPROT 14.8  --   --   --   INR 1.15  --   --   --   CREATININE 4.90* 3.83* 2.15* 1.47*  TROPONINI 0.03*  --   --   --     Estimated Creatinine Clearance: 57.6 mL/min (by C-G formula based on SCr of 1.47 mg/dL (H)).   Medical History: Past Medical History:  Diagnosis Date  . Bipolar 1 disorder (HCC)   . CHF (congestive heart failure) (HCC)   . COPD (chronic obstructive pulmonary disease) (HCC)   . DVT (deep venous thrombosis) (HCC)   . GERD (gastroesophageal reflux disease)   . High cholesterol   . Hypertension   . Schizophrenia (HCC)   . Seizure (HCC)     Medications:  Scheduled:  . ARIPiprazole  20 mg Oral Daily  . aspirin  81 mg Oral Daily  . buPROPion  300 mg Oral BH-q7a  . carvedilol  6.25 mg Oral BID  . cefTRIAXone  1 g Intravenous q1800  . cholecalciferol  1,000 Units Oral Daily  . finasteride  5 mg  Oral Daily  . mouth rinse  15 mL Mouth Rinse BID  . pantoprazole  40 mg Oral Daily  . PHENobarbital  129.6 mg Oral QODAY  . pravastatin  40 mg Oral Daily  . tamsulosin  0.8 mg Oral QPC supper    Assessment: Patient admitted for acute renal failure that is now improving. US results show bilateral DVTs. Pharmacy consulted to initiate apixaban therapy. Patient was on Heparin 5000 units SQ q8h, last dose given at 07:19 this morning.   Goal of Therapy:  resolution of symptoms Monitor platelets by anticoagulation protocol: Yes   Plan:  Will order Apixaban 10mg  bid for 7 days followed by 5mg  bid. Will monitor renal function and CBC and watch for any signs of bleeding.  Clovia CuffLisa Nikkole Placzek, PharmD, BCPS 05/24/2016 11:28 AM

## 2016-05-24 NOTE — Care Management (Addendum)
[facility] admitted [facility] with sepsis.  Patient open with [facility] for RN and PT. Barbara CowerJason with [facility] notified of admission. PT has assessed patient and recommend home health PT. Patient diagnosised with bilateral DVTs.  Plan to discharge back to ALF with Eliquis.  Free 30 day card for Eliquis  place on patient's chart for discharge.  Will need resumption of home care orders.  RNCM following.

## 2016-05-24 NOTE — Progress Notes (Signed)
Chaplain rounded Pt to provide spiritual support and prayer, but Pt was asleep at the time of this visit. Chaplain offered silent prayer for the Pt.    05/24/16 1700  Clinical Encounter Type  Visited With Patient  Visit Type Initial  Referral From Nurse  Spiritual Encounters  Spiritual Needs Prayer

## 2016-05-24 NOTE — Progress Notes (Addendum)
Patient ID: [name], male   DOB: 01/15/1941, 75 y.o.   MRN: [id number]  [facility] PROGRESS NOTE  DRAVIN LANCE WUJ:[id number] DOB: 1941/02/23 DOA: 05/21/2016 PCP: Lorenda Ishihara, MD  HPI/Subjective: Patient feeling leg pain in the right groin. Still having back pain. Having trouble feeding himself.  Objective: Vitals:   05/24/16 0557 05/24/16 0901  BP: (!) 147/69 (!) 157/79  Pulse: 74 82  Resp: (!) 24 20  Temp: 99.7 F (37.6 C) 99.4 F (37.4 C)    Filed Weights   05/21/16 2114  Weight: 117.9 kg (260 lb)    ROS: Review of Systems  Constitutional: Negative for chills and fever.  Eyes: Negative for blurred vision.  Respiratory: Negative for cough and shortness of breath.   Cardiovascular: Negative for chest pain.  Gastrointestinal: Negative for abdominal pain, constipation, diarrhea, nausea and vomiting.  Musculoskeletal: Positive for back pain and myalgias. Negative for joint pain.  Neurological: Negative for dizziness and headaches.   Exam: Physical Exam  HENT:  Nose: No mucosal edema.  Mouth/Throat: No oropharyngeal exudate or posterior oropharyngeal edema.  Eyes: Conjunctivae, EOM and lids are normal. Pupils are equal, round, and reactive to light.  Neck: No JVD present. Carotid bruit is not present. No edema present. No thyroid mass and no thyromegaly present.  Cardiovascular: S1 normal and S2 normal.  Exam reveals no gallop.   Murmur heard.  Systolic murmur is present with a grade of 4/6  Pulses:      Dorsalis pedis pulses are 2+ on the right side, and 2+ on the left side.  Respiratory: No respiratory distress. He has decreased breath sounds in the right lower field and the left lower field. He has no wheezes. He has no rhonchi. He has no rales.  GI: Soft. Bowel sounds are normal. There is no tenderness.  Musculoskeletal:       Right ankle: He exhibits swelling.       Left ankle: He exhibits swelling.  Patient able to straight leg raise  today.  Lymphadenopathy:    He has no cervical adenopathy.  Neurological: He is alert. No cranial nerve deficit.  Skin: Skin is warm. Nails show no clubbing.  Bilateral lower extremity erythema.  Psychiatric: He has a normal mood and affect.      Data Reviewed: Basic Metabolic Panel:  Recent Labs Lab 05/21/16 1756 05/22/16 0358 05/23/16 0353 05/24/16 0416  NA 139 139 141 139  K 4.7 4.4 4.9 3.4*  CL 106 108 112* 111  CO2 23 21* 20* 20*  GLUCOSE 103* 95 112* 102*  BUN 64* 57* 47* 40*  CREATININE 4.90* 3.83* 2.15* 1.47*  CALCIUM 8.8* 8.5* 8.2* 8.2*   Liver Function Tests:  Recent Labs Lab 05/21/16 1756  AST 19  ALT 8*  ALKPHOS 102  BILITOT 0.8  PROT 7.8  ALBUMIN 3.6    Recent Labs Lab 05/21/16 1756  LIPASE 41   CBC:  Recent Labs Lab 05/21/16 1756 05/22/16 0358  WBC 10.1 9.5  NEUTROABS 8.3*  --   HGB 12.5* 12.9*  HCT 37.6* 38.6*  MCV 87.8 89.4  PLT 172 124*   Cardiac Enzymes:  Recent Labs Lab 05/21/16 1756  TROPONINI 0.03*   BNP (last 3 results)  Recent Labs  05/21/16 1756  BNP 92.0     Recent Results (from the past 240 hour(s))  Culture, blood (routine x 2)     Status: None (Preliminary result)   Collection Time: 05/21/16  5:56 PM  Result  Value Ref Range Status   Specimen Description BLOOD RIGHT ANTECUBITAL  Final   Special Requests BOTTLES DRAWN AEROBIC AND ANAEROBIC  10CC  Final   Culture NO GROWTH 3 DAYS  Final   Report Status PENDING  Incomplete  Urine culture     Status: Abnormal (Preliminary result)   Collection Time: 05/21/16  5:56 PM  Result Value Ref Range Status   Specimen Description URINE, RANDOM  Final   Special Requests NONE  Final   Culture (A)  Final    80,000 COLONIES/mL ESCHERICHIA COLI CULTURE REINCUBATED FOR BETTER GROWTH    Report Status PENDING  Incomplete   Organism ID, Bacteria ESCHERICHIA COLI (A)  Final      Susceptibility   Escherichia coli - MIC*    AMPICILLIN >=32 RESISTANT Resistant      CEFAZOLIN <=4 SENSITIVE Sensitive     CEFTRIAXONE <=1 SENSITIVE Sensitive     CIPROFLOXACIN <=0.25 SENSITIVE Sensitive     GENTAMICIN <=1 SENSITIVE Sensitive     IMIPENEM <=0.25 SENSITIVE Sensitive     NITROFURANTOIN <=16 SENSITIVE Sensitive     TRIMETH/SULFA <=20 SENSITIVE Sensitive     AMPICILLIN/SULBACTAM 16 INTERMEDIATE Intermediate     PIP/TAZO <=4 SENSITIVE Sensitive     Extended ESBL Value in next row Sensitive      NEGATIVEPerformed at [facility]    * 80,000 COLONIES/mL ESCHERICHIA COLI  Culture, blood (routine x 2)     Status: None (Preliminary result)   Collection Time: 05/21/16  6:50 PM  Result Value Ref Range Status   Specimen Description BLOOD RIGHT HAND  Final   Special Requests BOTTLES DRAWN AEROBIC AND ANAEROBIC  1CC  Final   Culture NO GROWTH 3 DAYS  Final   Report Status PENDING  Incomplete     Studies: US Venous Img Lower Bilateral  Result Date: 05/24/2016 CLINICAL DATA:  Lower extremity pain and edema. EXAM: BILATERAL LOWER EXTREMITY VENOUS DOPPLER ULTRASOUND TECHNIQUE: Gray-scale sonography with graded compression, as well as color Doppler and duplex ultrasound were performed to evaluate the lower extremity deep venous systems from the level of the common femoral vein and including the common femoral, femoral, profunda femoral, popliteal and calf veins including the posterior tibial, peroneal and gastrocnemius veins when visible. The superficial great saphenous vein was also interrogated. Spectral Doppler was utilized to evaluate flow at rest and with distal augmentation maneuvers in the common femoral, femoral and popliteal veins. COMPARISON:  04/23/2016 FINDINGS: RIGHT LOWER EXTREMITY Nonocclusive echogenic thrombus in the right common femoral vein. There is flow at the right saphenofemoral junction. There appears to be nonocclusive thrombus in the deep right femoral vein. Echogenic thrombus the right femoral vein appears to be occlusive. Occlusive thrombus in  the right popliteal vein. Thrombus in the right posterior tibial veins. Limited evaluation of the right peroneal veins. LEFT LOWER EXTREMITY Occlusive thrombus in the left common femoral vein. The left saphenofemoral junction is thrombosed. Occlusive thrombus in the left profunda femoral vein. Occlusive thrombus throughout the left femoral vein. Occlusive thrombus in the left popliteal vein. Left posterior tibial veins appear to be occluded. Limited evaluation of the peroneal veins. IMPRESSION: Positive for bilateral lower extremity deep venous thrombosis. There is DVT extending from the calf to the common femoral veins bilaterally. Left lower extremity DVT is occlusive. The right lower extremity DVT is occlusive at the right popliteal vein and right femoral vein. These results will be called to the ordering clinician or representative by the Radiologist Assistant, and communication  documented in the PACS or zVision Dashboard. Electronically Signed   By: Richarda OverlieAdam  Henn M.D.   On: 05/24/2016 11:01    Scheduled Meds: . apixaban  10 mg Oral BID   Followed by  . [START ON 05/31/2016] apixaban  5 mg Oral BID  . ARIPiprazole  20 mg Oral Daily  . aspirin  81 mg Oral Daily  . buPROPion  300 mg Oral BH-q7a  . carvedilol  6.25 mg Oral BID  . cefTRIAXone  1 g Intravenous q1800  . cholecalciferol  1,000 Units Oral Daily  . finasteride  5 mg Oral Daily  . mouth rinse  15 mL Mouth Rinse BID  . pantoprazole  40 mg Oral Daily  . PHENobarbital  129.6 mg Oral QODAY  . pravastatin  40 mg Oral Daily  . tamsulosin  0.8 mg Oral QPC supper    Assessment/Plan:  1. Clinical sepsis with fever, tachycardia and acute cystitis with hematuria.  IV Rocephin while here, keflex upon d/c 2. Bilateral lower extremity DVT. Start Eliquis.  3. Acute kidney injury which is postobstructive in nature. Bladder scan in the ER ordered by me showed a greater than 999 in the bladder. Once Foley catheter was placed 3 L drained out.   Creatinine improved to 1.47 today. 4. BPH with obstruction. I changed Terazosin to high-dose Flomax. Continue finasteride. Foley will have to stay in at least a week. Will need urology as outpatient. 5. Acute respiratory distress on presentation likely from sepsis. Patient required BiPAP initially but is now breathing comfortably on off oxygen. 6. Essential hypertension on Coreg 7. Lower extremity edema. Hopefully we'll be able to continue Lasix once acute renal failure improves 8. Bipolar disorder. Continue psychiatric medications 9. History of seizure disorder on phenobarbital 10. Weakness. Physical therapy evaluation 11. Social worker to see the patient about his placement issues 12. 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 This is not a cellulitis. Traimcinolone cream.   Patient changed his CODE STATUS to DO NOT RESUSCITATE. I will order. Code Status:     Code Status Orders        Start     Ordered   05/21/16 1937  Full code  Continuous     05/21/16 1936    Code Status History    Date Active Date Inactive Code Status Order ID Comments User Context   04/23/2016  4:22 PM 04/24/2016 12:58 PM Full Code [id number]  Auburn BilberryShreyang Patel, MD ED   02/21/2015  3:37 PM 02/24/2015  4:42 PM Full Code [id number]  Shaune PollackQing Chen, MD Inpatient      Disposition Plan: Potential disposition tomorrow.  Antibiotics:  Rocephin  Time spent: 24 minutes  Alford HighlandWIETING, Sherran Margolis  [employer]

## 2016-05-24 NOTE — Progress Notes (Signed)
Lab called stool result of "Vibrio-cholerae".  On-call hospitalist paged. Awaiting call back. Windy Carinaurner,Tedrick Port K, RN 05/24/2016 11:56 PM

## 2016-05-25 LAB — BASIC METABOLIC PANEL
Anion gap: 6 (ref 5–15)
BUN: 35 mg/dL — ABNORMAL HIGH (ref 6–20)
CALCIUM: 8.2 mg/dL — AB (ref 8.9–10.3)
CO2: 24 mmol/L (ref 22–32)
CREATININE: 1.47 mg/dL — AB (ref 0.61–1.24)
Chloride: 109 mmol/L (ref 101–111)
GFR calc Af Amer: 53 mL/min — ABNORMAL LOW (ref >60)
GFR calc non Af Amer: 46 mL/min — ABNORMAL LOW (ref >60)
GLUCOSE: 110 mg/dL — AB (ref 65–99)
Potassium: 3.4 mmol/L — ABNORMAL LOW (ref 3.5–5.1)
Sodium: 139 mmol/L (ref 135–145)

## 2016-05-25 LAB — CLOSTRIDIUM DIFFICILE BY PCR: Toxigenic C. Difficile by PCR: POSITIVE — AB

## 2016-05-25 MED ORDER — POTASSIUM CHLORIDE CRYS ER 20 MEQ PO TBCR
40.0000 meq | EXTENDED_RELEASE_TABLET | Freq: Once | ORAL | Status: AC
  Administered 2016-05-25: 40 meq via ORAL
  Filled 2016-05-25: qty 2

## 2016-05-25 MED ORDER — TRIAMCINOLONE ACETONIDE 0.1 % EX CREA
TOPICAL_CREAM | Freq: Two times a day (BID) | CUTANEOUS | Status: DC
  Administered 2016-05-25 – 2016-05-27 (×6): via TOPICAL
  Filled 2016-05-25: qty 15

## 2016-05-25 MED ORDER — CIPROFLOXACIN IN D5W 400 MG/200ML IV SOLN
400.0000 mg | Freq: Two times a day (BID) | INTRAVENOUS | Status: AC
  Administered 2016-05-25 (×2): 400 mg via INTRAVENOUS
  Filled 2016-05-25 (×2): qty 200

## 2016-05-25 MED ORDER — VANCOMYCIN 50 MG/ML ORAL SOLUTION
125.0000 mg | Freq: Four times a day (QID) | ORAL | Status: DC
  Administered 2016-05-25 – 2016-05-28 (×14): 125 mg via ORAL
  Filled 2016-05-25 (×16): qty 2.5

## 2016-05-25 NOTE — Progress Notes (Signed)
Physical Therapy Treatment Patient Details Name: Cephas DarbyJerry L Maniaci MRN: [id number] DOB: [birth year] Today's Date: 05/25/2016    History of Present Illness Hanley SeamenJerry Burich is a 75yo male who [facility] after sustaining a fall at his facility. CC: low back pain and BLE pain, noted to have AKI. Pt diagnosed with B LE DVT 05/24/16 and started on therapeutic dose of anti-coagulation meds same date at 12 p.m.  At baseline he resides at ALF, using a WC for primary mobility and intermittent ability to perform bed mobility/transfers at min-maxA variable performance. PMH includes bilpolar disorder.     PT Comments    Pt continues to demonstrate generalized weakness in B UE 's and LE 's. Pt able to complete LE exercises without assistance, however can only complete small number of reps secondary to pain (8/10 B with activity). Pt with mod verbal and tactile cues able to complete bed mobility +2 mod-max assist. Pt able to tolerate sit<>stand x 2 trials min-mod +2 assist though fatigues quickly with drop from 142/74 supine to 129/79 standing BP . Pt will benefit from continued PT to improve functional mobility for baseline transfers bed to WC using RW. Pt will benefit from progressing therapeutic exercises and standing tolerance.   Follow Up Recommendations  SNF     Equipment Recommendations  Rolling walker with 5" wheels    Recommendations for Other Services       Precautions / Restrictions Precautions Precautions: Fall Restrictions Weight Bearing Restrictions: No    Mobility  Bed Mobility Overal bed mobility: Needs Assistance;+2 for physical assistance Bed Mobility: Rolling;Supine to Sit;Sit to Supine Rolling: Mod assist;Max assist;+2 for physical assistance (pt able to bend B LE's and intiate reaching/pulling on bed rail to assist rolling; x 2 trials)   Supine to sit: Max assist;+2 for physical assistance Sit to supine: Total assist   General bed mobility comments: Pt requires  mod vc's and tactile cues to participate in bed mobility; difficulty bringing LE's to EOB secondary to pain  Transfers Overall transfer level: Needs assistance Equipment used: Rolling walker (2 wheeled) Transfers: Sit to/from Stand Sit to Stand: Min assist;Mod assist;+2 physical assistance (R knee blocked)         General transfer comment: x2 trials; Pt unable to correct flexed/kyphotic posture in standing; improved knee extension and pushing hips anteriorly with min vc's; pt able to take shuffles lateral step to the L with L LE; pt able to shift weight onto L LE but unable to move R LE to the left; fatigues easily with this activity  Ambulation/Gait             General Gait Details: unable at this time   Stairs            Wheelchair Mobility    Modified Rankin (Stroke Patients Only)       Balance Overall balance assessment: History of Falls;Needs assistance Sitting-balance support: Feet supported;Bilateral upper extremity supported Sitting balance-Leahy Scale: Fair Sitting balance - Comments: CGA for sitting balance, no LOB   Standing balance support: Bilateral upper extremity supported Standing balance-Leahy Scale: Poor                      Cognition Arousal/Alertness: Awake/alert Behavior During Therapy: Flat affect Overall Cognitive Status: Within Functional Limits for tasks assessed                      Exercises General Exercises - Lower Extremity Ankle Circles/Pumps: AROM;Strengthening;Both;10  reps;Supine Short Arc Quad: AROM;Strengthening;Both;5 reps Heel Slides: AROM;Strengthening;Both;5 reps;Supine    General Comments General comments (skin integrity, edema, etc.): Pt is agreeable to PT session.       Pertinent Vitals/Pain Pain Assessment: 0-10 Pain Score: 8  (8/10 L LE; 7/10 R LE) Pain Location: B upper legs Pain Descriptors / Indicators: Aching Pain Intervention(s): Limited activity within patient's tolerance;Monitored  during session;Repositioned  Supine BP 142/74, sitting BP 141/69, and standing BP 129/79. Pt O2 97% and HR 74 bpm at beginning of session.    Home Living                      Prior Function            PT Goals (current goals can now be found in the care plan section) Acute Rehab PT Goals PT Goal Formulation: With patient Progress towards PT goals: Progressing toward goals    Frequency    Min 2X/week      PT Plan Discharge plan needs to be updated    Co-evaluation             End of Session Equipment Utilized During Treatment: Gait belt Activity Tolerance: Patient limited by fatigue;Patient limited by pain Patient left: in bed;with call bell/phone within reach;with bed alarm set     Time: 1113-1140 PT Time Calculation (min) (ACUTE ONLY): 27 min  Charges:                       G CodesAlbertina Senegal, SPT 05/25/2016, 12:34 PM

## 2016-05-25 NOTE — Progress Notes (Addendum)
Pharmacy Antibiotic Note  [name] is a 75 y.o. male admitted on 05/21/2016 with intra-abdominal infection.  Pharmacy has been consulted for ciprofloxacin dosing.  Plan: Ciprofloxacin 400 mg q 12 hours ordered.  Height: 5\' 10"  (177.8 cm) Weight: 260 lb (117.9 kg) IBW/kg (Calculated) : 73  Temp (24hrs), Avg:99.2 F (37.3 C), Min:98.3 F (36.8 C), Max:100 F (37.8 C)   Recent Labs Lab 05/21/16 1756 05/22/16 0358 05/23/16 0353 05/24/16 0416  WBC 10.1 9.5  --   --   CREATININE 4.90* 3.83* 2.15* 1.47*  LATICACIDVEN 1.1  --   --   --     Estimated Creatinine Clearance: 57.6 mL/min (by C-G formula based on SCr of 1.47 mg/dL (H)).    Allergies  Allergen Reactions  . Penicillin G Other (See Comments)    Convulsions. Has patient had a PCN reaction causing immediate rash, facial/tongue/throat swelling, SOB or lightheadedness with hypotension: not sure Has patient had a PCN reaction causing severe rash involving mucus membranes or skin necrosis: not sure Has patient had a PCN reaction that required hospitalization: not sure Has patient had a PCN reaction occurring within the last 10 years: not sure If all of the above answers are "NO", then may proceed with Cephalosporin use.    Antimicrobials this admission: ceftriaxone  >>  Ciprofloxacin, vancomycin PO >>   Dose adjustments this admission:   Microbiology results: 10/13 BCx: NGTD 10/13 UCx: E.coli (S) to ceftriaxone, ciprofloxacin 9/15  MRSA PCR: (-) 10/16 C. diff PCR (+)  Thank you for allowing pharmacy to be a part of this patient's care.  Zahara Rembert S 05/25/2016 1:24 AM

## 2016-05-25 NOTE — Progress Notes (Signed)
Dr. Emmit PomfretHugelmeyer paged for stool result [phone]; awaiting callback. Leeum Sankey K 12:17 AM 05/25/2016

## 2016-05-25 NOTE — Progress Notes (Signed)
Dr. Emmit PomfretHugelmeyer notified of C.diff positive and Vibrio-cholerae stool; Rocephin in progress (emar).Windy Carinaurner,[place] K, RN 12:59 AM 05/25/2016

## 2016-05-25 NOTE — Progress Notes (Signed)
Called House Nursing Supervisor because nursing is unable to contact Hospitalists on-call via pager numbers or calling ED Hospitalist's desk. Windy Carina, RN 12:53 AM 05/25/2016

## 2016-05-25 NOTE — Clinical Social Work Note (Signed)
CSW informed by PT that they are changing their recommendation to SNF. CSW spoke with patient who stated that he would be willing to go to SNF for rehab with the intention of returning [facility] after [facility]. CSW has initiated a Scientist, physiologicalpasrr for SNF which is under review. CSW has initiated a bedsearch. York SpanielMonica Dayyan Krist MSW,LCSW [phone]

## 2016-05-25 NOTE — Progress Notes (Signed)
Patient ID: [name], male   DOB: 1940-10-31, 75 y.o.   MRN: [id number]   [facility] PROGRESS NOTE  [name] WNU:[id number] DOB: Apr 26, 1941 DOA: 05/21/2016 PCP: Lorenda Ishihara, MD  HPI/Subjective: Patient had 4 episodes of diarrhea last night. His leg pain is little bit better. Still has some back pain.  Objective: Vitals:   05/24/16 2004 05/25/16 0003  BP: 126/65 131/74  Pulse: 78 75  Resp: 18 (!) 22  Temp: 98.8 F (37.1 C) 98.3 F (36.8 C)    Filed Weights   05/21/16 2114  Weight: 117.9 kg (260 lb)    ROS: Review of Systems  Constitutional: Negative for chills and fever.  Eyes: Negative for blurred vision.  Respiratory: Negative for cough and shortness of breath.   Cardiovascular: Negative for chest pain.  Gastrointestinal: Positive for diarrhea. Negative for abdominal pain, constipation, nausea and vomiting.  Musculoskeletal: Positive for back pain and myalgias. Negative for joint pain.  Neurological: Negative for dizziness and headaches.   Exam: Physical Exam  HENT:  Nose: No mucosal edema.  Mouth/Throat: No oropharyngeal exudate or posterior oropharyngeal edema.  Eyes: Conjunctivae, EOM and lids are normal. Pupils are equal, round, and reactive to light.  Neck: No JVD present. Carotid bruit is not present. No edema present. No thyroid mass and no thyromegaly present.  Cardiovascular: S1 normal and S2 normal.  Exam reveals no gallop.   Murmur heard.  Systolic murmur is present with a grade of 4/6  Pulses:      Dorsalis pedis pulses are 2+ on the right side, and 2+ on the left side.  Respiratory: No respiratory distress. He has decreased breath sounds in the right lower field and the left lower field. He has no wheezes. He has no rhonchi. He has no rales.  GI: Soft. Bowel sounds are normal. There is no tenderness.  Musculoskeletal:       Right ankle: He exhibits swelling.       Left ankle: He exhibits swelling.  Patient able to  straight leg raise today.  Lymphadenopathy:    He has no cervical adenopathy.  Neurological: He is alert. No cranial nerve deficit.  Skin: Skin is warm. Nails show no clubbing.  Bilateral lower extremity erythema.  Psychiatric: He has a normal mood and affect.      Data Reviewed: Basic Metabolic Panel:  Recent Labs Lab 05/21/16 1756 05/22/16 0358 05/23/16 0353 05/24/16 0416 05/25/16 0434  NA 139 139 141 139 139  K 4.7 4.4 4.9 3.4* 3.4*  CL 106 108 112* 111 109  CO2 23 21* 20* 20* 24  GLUCOSE 103* 95 112* 102* 110*  BUN 64* 57* 47* 40* 35*  CREATININE 4.90* 3.83* 2.15* 1.47* 1.47*  CALCIUM 8.8* 8.5* 8.2* 8.2* 8.2*   Liver Function Tests:  Recent Labs Lab 05/21/16 1756  AST 19  ALT 8*  ALKPHOS 102  BILITOT 0.8  PROT 7.8  ALBUMIN 3.6    Recent Labs Lab 05/21/16 1756  LIPASE 41   CBC:  Recent Labs Lab 05/21/16 1756 05/22/16 0358  WBC 10.1 9.5  NEUTROABS 8.3*  --   HGB 12.5* 12.9*  HCT 37.6* 38.6*  MCV 87.8 89.4  PLT 172 124*   Cardiac Enzymes:  Recent Labs Lab 05/21/16 1756  TROPONINI 0.03*   BNP (last 3 results)  Recent Labs  05/21/16 1756  BNP 92.0     Recent Results (from the past 240 hour(s))  Culture, blood (routine x 2)  Status: None (Preliminary result)   Collection Time: 05/21/16  5:56 PM  Result Value Ref Range Status   Specimen Description BLOOD RIGHT ANTECUBITAL  Final   Special Requests BOTTLES DRAWN AEROBIC AND ANAEROBIC  10CC  Final   Culture NO GROWTH 4 DAYS  Final   Report Status PENDING  Incomplete  Urine culture     Status: Abnormal (Preliminary result)   Collection Time: 05/21/16  5:56 PM  Result Value Ref Range Status   Specimen Description URINE, RANDOM  Final   Special Requests NONE  Final   Culture (A)  Final    80,000 COLONIES/mL ESCHERICHIA COLI CULTURE REINCUBATED FOR BETTER GROWTH    Report Status PENDING  Incomplete   Organism ID, Bacteria ESCHERICHIA COLI (A)  Final      Susceptibility    Escherichia coli - MIC*    AMPICILLIN >=32 RESISTANT Resistant     CEFAZOLIN <=4 SENSITIVE Sensitive     CEFTRIAXONE <=1 SENSITIVE Sensitive     CIPROFLOXACIN <=0.25 SENSITIVE Sensitive     GENTAMICIN <=1 SENSITIVE Sensitive     IMIPENEM <=0.25 SENSITIVE Sensitive     NITROFURANTOIN <=16 SENSITIVE Sensitive     TRIMETH/SULFA <=20 SENSITIVE Sensitive     AMPICILLIN/SULBACTAM 16 INTERMEDIATE Intermediate     PIP/TAZO <=4 SENSITIVE Sensitive     Extended ESBL Value in next row Sensitive      [facility] at [facility]    * 80,000 COLONIES/mL ESCHERICHIA COLI  Culture, blood (routine x 2)     Status: None (Preliminary result)   Collection Time: 05/21/16  6:50 PM  Result Value Ref Range Status   Specimen Description BLOOD RIGHT HAND  Final   Special Requests BOTTLES DRAWN AEROBIC AND ANAEROBIC  1CC  Final   Culture NO GROWTH 4 DAYS  Final   Report Status PENDING  Incomplete  C difficile quick scan w PCR reflex     Status: Abnormal   Collection Time: 05/24/16  9:49 PM  Result Value Ref Range Status   C Diff antigen POSITIVE (A) NEGATIVE Final   C Diff toxin NEGATIVE NEGATIVE Final   C Diff interpretation Results are indeterminate. See PCR results.  Final  Gastrointestinal Panel by PCR , Stool     Status: Abnormal   Collection Time: 05/24/16  9:49 PM  Result Value Ref Range Status   Campylobacter species NOT DETECTED NOT DETECTED Final   Plesimonas shigelloides NOT DETECTED NOT DETECTED Final   Salmonella species NOT DETECTED NOT DETECTED Final   Yersinia enterocolitica NOT DETECTED NOT DETECTED Final   Vibrio species DETECTED (A) NOT DETECTED Final    Comment: RESULT CALLED TO, READ BACK BY AND VERIFIED WITH: MARCELLA TURNER AT 2335 ON 05/24/16 RWW    Vibrio cholerae DETECTED (A) NOT DETECTED Final    Comment: RESULT CALLED TO, READ BACK BY AND VERIFIED WITH: MARCELLA TURNER AT 2335 ON 05/24/16 RWW    Enteroaggregative E coli (EAEC) NOT DETECTED NOT DETECTED Final    Enteropathogenic E coli (EPEC) NOT DETECTED NOT DETECTED Final   Enterotoxigenic E coli (ETEC) NOT DETECTED NOT DETECTED Final   Shiga like toxin producing E coli (STEC) NOT DETECTED NOT DETECTED Final   Shigella/Enteroinvasive E coli (EIEC) NOT DETECTED NOT DETECTED Final   Cryptosporidium NOT DETECTED NOT DETECTED Final   Cyclospora cayetanensis NOT DETECTED NOT DETECTED Final   Entamoeba histolytica NOT DETECTED NOT DETECTED Final   Giardia lamblia NOT DETECTED NOT DETECTED Final   Adenovirus F40/41 NOT DETECTED  NOT DETECTED Final   Astrovirus NOT DETECTED NOT DETECTED Final   Norovirus GI/GII NOT DETECTED NOT DETECTED Final   Rotavirus A NOT DETECTED NOT DETECTED Final   Sapovirus (I, II, IV, and V) NOT DETECTED NOT DETECTED Final  Clostridium Difficile by PCR     Status: Abnormal   Collection Time: 05/24/16  9:49 PM  Result Value Ref Range Status   Toxigenic C Difficile by pcr POSITIVE (A) NEGATIVE Final    Comment: Positive for toxigenic C. difficile with little to no toxin production. Only treat if clinical presentation suggests symptomatic illness. RESULT CALLED TO, READ BACK BY AND VERIFIED WITH: MARCELLA TURNER AT 0030 ON 05/25/16 RWW      Studies: US Venous Img Lower Bilateral  Result Date: 05/24/2016 CLINICAL DATA:  Lower extremity pain and edema. EXAM: BILATERAL LOWER EXTREMITY VENOUS DOPPLER ULTRASOUND TECHNIQUE: Gray-scale sonography with graded compression, as well as color Doppler and duplex ultrasound were performed to evaluate the lower extremity deep venous systems from the level of the common femoral vein and including the common femoral, femoral, profunda femoral, popliteal and calf veins including the posterior tibial, peroneal and gastrocnemius veins when visible. The superficial great saphenous vein was also interrogated. Spectral Doppler was utilized to evaluate flow at rest and with distal augmentation maneuvers in the common femoral, femoral and popliteal veins.  COMPARISON:  04/23/2016 FINDINGS: RIGHT LOWER EXTREMITY Nonocclusive echogenic thrombus in the right common femoral vein. There is flow at the right saphenofemoral junction. There appears to be nonocclusive thrombus in the deep right femoral vein. Echogenic thrombus the right femoral vein appears to be occlusive. Occlusive thrombus in the right popliteal vein. Thrombus in the right posterior tibial veins. Limited evaluation of the right peroneal veins. LEFT LOWER EXTREMITY Occlusive thrombus in the left common femoral vein. The left saphenofemoral junction is thrombosed. Occlusive thrombus in the left profunda femoral vein. Occlusive thrombus throughout the left femoral vein. Occlusive thrombus in the left popliteal vein. Left posterior tibial veins appear to be occluded. Limited evaluation of the peroneal veins. IMPRESSION: Positive for bilateral lower extremity deep venous thrombosis. There is DVT extending from the calf to the common femoral veins bilaterally. Left lower extremity DVT is occlusive. The right lower extremity DVT is occlusive at the right popliteal vein and right femoral vein. These results will be called to the ordering clinician or representative by the Radiologist Assistant, and communication documented in the PACS or zVision Dashboard. Electronically Signed   By: Richarda Overlie M.D.   On: 05/24/2016 11:01    Scheduled Meds: . apixaban  10 mg Oral BID   Followed by  . [START ON 05/31/2016] apixaban  5 mg Oral BID  . ARIPiprazole  20 mg Oral Daily  . aspirin  81 mg Oral Daily  . buPROPion  300 mg Oral BH-q7a  . carvedilol  6.25 mg Oral BID  . cholecalciferol  1,000 Units Oral Daily  . ciprofloxacin  400 mg Intravenous Q12H  . finasteride  5 mg Oral Daily  . mouth rinse  15 mL Mouth Rinse BID  . pantoprazole  40 mg Oral Daily  . PHENobarbital  129.6 mg Oral QODAY  . pravastatin  40 mg Oral Daily  . sodium chloride flush  3 mL Intravenous Q12H  . tamsulosin  0.8 mg Oral QPC supper   . vancomycin  125 mg Oral QID    Assessment/Plan:  1. Clinical sepsis with fever, tachycardia and acute cystitis with hematuria.  IV Rocephin Switched over  to Cipro. 2. Infectious diarrhea. Stool positive for C. difficile and vibrio. Oral vancomycin started. IV Cipro started for the vibrio. Likely will need a dose or 2 of this. 3. Bilateral lower extremity DVT. Started Eliquis yesterday.  4. Acute kidney injury which is postobstructive in nature. Bladder scan in the ER ordered by me showed a greater than 999 in the bladder. Once Foley catheter was placed 3 L drained out.  Creatinine improved to 1.47 today. 5. BPH with obstruction. I changed Terazosin to high-dose Flomax. Continue finasteride. Foley will have to stay in at least a week. Will need urology as outpatient. 6. Acute respiratory distress on presentation likely from sepsis. Patient required BiPAP initially but is now breathing comfortably on off oxygen. 7. Essential hypertension on Coreg 8. Lower extremity edema. Hold off on restarting Lasix since the patient having diarrhea. 9. Bipolar disorder. Continue psychiatric medications 10. 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 Weakness. Physical therapy evaluation 12. Social worker and Futures tradercare manager to work on placement issues 13. 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 This is not a cellulitis. Traimcinolone cream.   Patient changed his CODE STATUS to DO NOT RESUSCITATE. I will order. Code Status:     Code Status Orders        Start     Ordered   05/21/16 1937  Full code  Continuous     05/21/16 1936    Code Status History    Date Active Date Inactive Code Status Order ID Comments User Context   04/23/2016  4:22 PM 04/24/2016 12:58 PM Full Code [id number]  Auburn BilberryShreyang Patel, MD ED   02/21/2015  3:37 PM 02/24/2015  4:42 PM Full Code [id number]  Shaune PollackQing Chen, MD Inpatient      Disposition Plan:  Evaluate daily for disposition    Antibiotics:  Oral vancomycin    IV Cipro  Time spent: 25 minutes  Alford HighlandWIETING, Ethelda Deangelo  [employer]

## 2016-05-25 NOTE — Progress Notes (Signed)
Dr. Tobi BastosPyreddy [phone] paged for stool results of C. Diff positive and Vibrio-Cholerae.  Awaiting callback. Windy Carinaurner,Autumn Pruitt K, RN 05/25/2016 12:32 AM

## 2016-05-25 NOTE — Progress Notes (Signed)
This was a follow-up visit with the Pt, whom the Ch visited yesterday, but Pt was asleep. Ch was able to talk with Pt and then Pt asked for prayers for his soul, healing and family which the chaplain provided.   05/25/16 1700  Clinical Encounter Type  Visited With Patient  Visit Type Follow-up;Spiritual support  Spiritual Encounters  Spiritual Needs Prayer

## 2016-05-25 NOTE — Progress Notes (Signed)
Prime doc on call was notified of the blood- tinged urine noted in the foley bag.

## 2016-05-25 NOTE — Progress Notes (Signed)
ID note Received notice that patients [facility] GI panel + for Vibrio Cholera Spoke with lab. C diff also +. I suspect cholera test is false +. Spoke with Dr Oneita Krasubinas who will contact [facility].

## 2016-05-26 ENCOUNTER — Inpatient Hospital Stay: Payer: Medicare Other

## 2016-05-26 LAB — CULTURE, BLOOD (ROUTINE X 2)
CULTURE: NO GROWTH
Culture: NO GROWTH

## 2016-05-26 LAB — GASTROINTESTINAL PANEL BY PCR, STOOL (REPLACES STOOL CULTURE)
ASTROVIRUS: NOT DETECTED
Adenovirus F40/41: NOT DETECTED
CAMPYLOBACTER SPECIES: NOT DETECTED
Cryptosporidium: NOT DETECTED
Cyclospora cayetanensis: NOT DETECTED
ENTEROTOXIGENIC E COLI (ETEC): NOT DETECTED
Entamoeba histolytica: NOT DETECTED
Enteroaggregative E coli (EAEC): NOT DETECTED
Enteropathogenic E coli (EPEC): NOT DETECTED
Giardia lamblia: NOT DETECTED
NOROVIRUS GI/GII: NOT DETECTED
PLESIMONAS SHIGELLOIDES: NOT DETECTED
Rotavirus A: NOT DETECTED
SAPOVIRUS (I, II, IV, AND V): NOT DETECTED
SHIGA LIKE TOXIN PRODUCING E COLI (STEC): NOT DETECTED
Salmonella species: NOT DETECTED
Shigella/Enteroinvasive E coli (EIEC): NOT DETECTED
Vibrio cholerae: NOT DETECTED
Vibrio species: NOT DETECTED
Yersinia enterocolitica: NOT DETECTED

## 2016-05-26 LAB — URINE CULTURE: Culture: 80000 — AB

## 2016-05-26 NOTE — Care Management Important Message (Signed)
Important Message  Patient Details  Name: [name] DarbyJerry L Tay MRN: [id number] Date of Birth: 1940-10-04   Medicare Important Message Given:  Yes    Chapman FitchBOWEN, Asheley Hellberg T, RN 05/26/2016, 2:01 PM

## 2016-05-26 NOTE — Progress Notes (Signed)
Patient ID: [name], male   DOB: 1940-09-12, 75 y.o.   MRN: [id number]   [facility] PROGRESS NOTE  [name] WUJ:[id number] DOB: [birth year] DOA: 05/21/2016 PCP: Lorenda Ishihara, MD  HPI/Subjective: Diarrhea has subsided. Infectious disease specialist feels that the vibrio cholera test was false-positive. Left leg pain is little some better. Overall, the patient feels improved  Objective: Vitals:   05/25/16 2106 05/26/16 0527  BP: (!) 153/80 (!) 161/79  Pulse: 74 80  Resp: 18 20  Temp: 99.7 F (37.6 C) (!) 100.7 F (38.2 C)    Filed Weights   05/21/16 2114  Weight: 117.9 kg (260 lb)    ROS: Review of Systems  Constitutional: Negative for chills and fever.  Eyes: Negative for blurred vision.  Respiratory: Negative for cough and shortness of breath.   Cardiovascular: Negative for chest pain.  Gastrointestinal: Positive for diarrhea. Negative for abdominal pain, constipation, nausea and vomiting.  Musculoskeletal: Positive for back pain and myalgias. Negative for joint pain.  Neurological: Negative for dizziness and headaches.   Exam: Physical Exam  HENT:  Nose: No mucosal edema.  Mouth/Throat: No oropharyngeal exudate or posterior oropharyngeal edema.  Eyes: Conjunctivae, EOM and lids are normal. Pupils are equal, round, and reactive to light.  Neck: No JVD present. Carotid bruit is not present. No edema present. No thyroid mass and no thyromegaly present.  Cardiovascular: S1 normal and S2 normal.  Exam reveals no gallop.   Murmur heard.  Systolic murmur is present with a grade of 4/6  Pulses:      Dorsalis pedis pulses are 2+ on the right side, and 2+ on the left side.  Respiratory: No respiratory distress. He has decreased breath sounds in the right lower field and the left lower field. He has no wheezes. He has no rhonchi. He has no rales.  GI: Soft. Bowel sounds are normal. There is no tenderness.  Musculoskeletal:       Right ankle: He  exhibits swelling.       Left ankle: He exhibits swelling.  Patient able to straight leg raise today.  Lymphadenopathy:    He has no cervical adenopathy.  Neurological: He is alert. No cranial nerve deficit.  Skin: Skin is warm. Nails show no clubbing.  Bilateral lower extremity erythema.  Psychiatric: He has a normal mood and affect.      Data Reviewed: Basic Metabolic Panel:  Recent Labs Lab 05/21/16 1756 05/22/16 0358 05/23/16 0353 05/24/16 0416 05/25/16 0434  NA 139 139 141 139 139  K 4.7 4.4 4.9 3.4* 3.4*  CL 106 108 112* 111 109  CO2 23 21* 20* 20* 24  GLUCOSE 103* 95 112* 102* 110*  BUN 64* 57* 47* 40* 35*  CREATININE 4.90* 3.83* 2.15* 1.47* 1.47*  CALCIUM 8.8* 8.5* 8.2* 8.2* 8.2*   Liver Function Tests:  Recent Labs Lab 05/21/16 1756  AST 19  ALT 8*  ALKPHOS 102  BILITOT 0.8  PROT 7.8  ALBUMIN 3.6    Recent Labs Lab 05/21/16 1756  LIPASE 41   CBC:  Recent Labs Lab 05/21/16 1756 05/22/16 0358  WBC 10.1 9.5  NEUTROABS 8.3*  --   HGB 12.5* 12.9*  HCT 37.6* 38.6*  MCV 87.8 89.4  PLT 172 124*   Cardiac Enzymes:  Recent Labs Lab 05/21/16 1756  TROPONINI 0.03*   BNP (last 3 results)  Recent Labs  05/21/16 1756  BNP 92.0     Recent Results (from the past 240 hour(s))  Culture, blood (routine x 2)     Status: None   Collection Time: 05/21/16  5:56 PM  Result Value Ref Range Status   Specimen Description BLOOD RIGHT ANTECUBITAL  Final   Special Requests BOTTLES DRAWN AEROBIC AND ANAEROBIC  10CC  Final   Culture NO GROWTH 5 DAYS  Final   Report Status 05/26/2016 FINAL  Final  Urine culture     Status: Abnormal   Collection Time: 05/21/16  5:56 PM  Result Value Ref Range Status   Specimen Description URINE, RANDOM  Final   Special Requests NONE  Final   Culture (A)  Final    80,000 COLONIES/mL ESCHERICHIA COLI 20,000 COLONIES/mL VANCOMYCIN RESISTANT ENTEROCOCCUS    Report Status 05/26/2016 FINAL  Final   Organism ID,  Bacteria ESCHERICHIA COLI (A)  Final   Organism ID, Bacteria VANCOMYCIN RESISTANT ENTEROCOCCUS (A)  Final      Susceptibility   Escherichia coli - MIC*    AMPICILLIN >=32 RESISTANT Resistant     CEFAZOLIN <=4 SENSITIVE Sensitive     CEFTRIAXONE <=1 SENSITIVE Sensitive     CIPROFLOXACIN <=0.25 SENSITIVE Sensitive     GENTAMICIN <=1 SENSITIVE Sensitive     IMIPENEM <=0.25 SENSITIVE Sensitive     NITROFURANTOIN <=16 SENSITIVE Sensitive     TRIMETH/SULFA <=20 SENSITIVE Sensitive     AMPICILLIN/SULBACTAM 16 INTERMEDIATE Intermediate     PIP/TAZO <=4 SENSITIVE Sensitive     Extended ESBL NEGATIVE Sensitive     * 80,000 COLONIES/mL ESCHERICHIA COLI   Vancomycin resistant enterococcus - MIC*    AMPICILLIN >=32 RESISTANT Resistant     LEVOFLOXACIN >=8 RESISTANT Resistant     NITROFURANTOIN 256 RESISTANT Resistant     VANCOMYCIN >=32 RESISTANT Resistant     LINEZOLID 2 SENSITIVE Sensitive     * 20,000 COLONIES/mL VANCOMYCIN RESISTANT ENTEROCOCCUS  Culture, blood (routine x 2)     Status: None   Collection Time: 05/21/16  6:50 PM  Result Value Ref Range Status   Specimen Description BLOOD RIGHT HAND  Final   Special Requests BOTTLES DRAWN AEROBIC AND ANAEROBIC  1CC  Final   Culture NO GROWTH 5 DAYS  Final   Report Status 05/26/2016 FINAL  Final  C difficile quick scan w PCR reflex     Status: Abnormal   Collection Time: 05/24/16  9:49 PM  Result Value Ref Range Status   C Diff antigen POSITIVE (A) NEGATIVE Final   C Diff toxin NEGATIVE NEGATIVE Final   C Diff interpretation Results are indeterminate. See PCR results.  Final  Gastrointestinal Panel by PCR , Stool     Status: Abnormal   Collection Time: 05/24/16  9:49 PM  Result Value Ref Range Status   Campylobacter species NOT DETECTED NOT DETECTED Final   Plesimonas shigelloides NOT DETECTED NOT DETECTED Final   Salmonella species NOT DETECTED NOT DETECTED Final   Yersinia enterocolitica NOT DETECTED NOT DETECTED Final   Vibrio  species DETECTED (A) NOT DETECTED Final    Comment: RESULT CALLED TO, READ BACK BY AND VERIFIED WITH: MARCELLA TURNER AT 2335 ON 05/24/16 RWW    Vibrio cholerae DETECTED (A) NOT DETECTED Final    Comment: RESULT CALLED TO, READ BACK BY AND VERIFIED WITH: MARCELLA TURNER AT 2335 ON 05/24/16 RWW    Enteroaggregative E coli (EAEC) NOT DETECTED NOT DETECTED Final   Enteropathogenic E coli (EPEC) NOT DETECTED NOT DETECTED Final   Enterotoxigenic E coli (ETEC) NOT DETECTED NOT DETECTED Final   Shiga like toxin  producing E coli (STEC) NOT DETECTED NOT DETECTED Final   Shigella/Enteroinvasive E coli (EIEC) NOT DETECTED NOT DETECTED Final   Cryptosporidium NOT DETECTED NOT DETECTED Final   Cyclospora cayetanensis NOT DETECTED NOT DETECTED Final   Entamoeba histolytica NOT DETECTED NOT DETECTED Final   Giardia lamblia NOT DETECTED NOT DETECTED Final   Adenovirus F40/41 NOT DETECTED NOT DETECTED Final   Astrovirus NOT DETECTED NOT DETECTED Final   Norovirus GI/GII NOT DETECTED NOT DETECTED Final   Rotavirus A NOT DETECTED NOT DETECTED Final   Sapovirus (I, II, IV, and V) NOT DETECTED NOT DETECTED Final  Clostridium Difficile by PCR     Status: Abnormal   Collection Time: 05/24/16  9:49 PM  Result Value Ref Range Status   Toxigenic C Difficile by pcr POSITIVE (A) NEGATIVE Final    Comment: Positive for toxigenic C. difficile with little to no toxin production. Only treat if clinical presentation suggests symptomatic illness. RESULT CALLED TO, READ BACK BY AND VERIFIED WITH: MARCELLA TURNER AT 0030 ON 05/25/16 RWW      Studies: US Abdomen Limited Ruq  Result Date: 05/26/2016 CLINICAL DATA:  Right upper quadrant pain . EXAM: US ABDOMEN LIMITED - RIGHT UPPER QUADRANT COMPARISON:  05/22/2016. FINDINGS: Gallbladder: No gallstones. Gallbladder wall thickened to 4 mm. Gallbladder wall thickening can be seen with hypoproteinemic states and cholecystitis. Common bile duct: Diameter: 4 mm Liver: No  focal lesion identified. Within normal limits in parenchymal echogenicity. Left lobe of the liver is obscured by overlying bowel gas. IMPRESSION: 1. No gallstones. Gallbladder wall however is slightly thickened at 4 mm. This can be seen with hyperproteinemia and or cholecystitis. No biliary distention. 2. Liver is unremarkable. Electronically Signed   By: Maisie Fus  Register   On: 05/26/2016 09:56    Scheduled Meds: . apixaban  10 mg Oral BID   Followed by  . [START ON 05/31/2016] apixaban  5 mg Oral BID  . ARIPiprazole  20 mg Oral Daily  . aspirin  81 mg Oral Daily  . buPROPion  300 mg Oral BH-q7a  . carvedilol  6.25 mg Oral BID  . cholecalciferol  1,000 Units Oral Daily  . finasteride  5 mg Oral Daily  . mouth rinse  15 mL Mouth Rinse BID  . pantoprazole  40 mg Oral Daily  . PHENobarbital  129.6 mg Oral QODAY  . pravastatin  40 mg Oral Daily  . sodium chloride flush  3 mL Intravenous Q12H  . tamsulosin  0.8 mg Oral QPC supper  . triamcinolone cream   Topical BID  . vancomycin  125 mg Oral QID    Assessment/Plan:  1. Clinical sepsis with fever, tachycardia and acute cystitis with hematuria.  IV Rocephin Switched over to Cipro. 2. Infectious diarrhea. Stool positive for C. difficile and vibrio cholera, could be false positive test, according to ID. Continue oral vancomycin.  3. Bilateral lower extremity DVT. Continue Eliquis .  4. Acute kidney injury which is postobstructive in nature. Bladder scan in the ER showed a greater than 999 in the bladder. Once Foley catheter was placed 3 L drained out.  Creatinine improved to 1.47 today, remains stable. Patient is to continue Foley catheter, finasteride, Flomax. Foley catheter could be withdrawn in about 1 week in physician's office.  5. BPH with bladder outlet obstruction. Continue Flomax, finasteride. Foley will have to stay in at least a week. Will need urology as outpatient. 6. Acute respiratory distress on presentation likely from sepsis.  Patient required BiPAP initially  but is now breathing comfortably on off oxygen. 7. Essential hypertension on Coreg 8. Lower extremity edema. Hold off on restarting Lasix since the patient having diarrhea. 9. Bipolar disorder. Continue psychiatric medications 10. 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 Weakness. Physical therapy evaluation is completed, skilled nursing facility was recommended, social worker is involved 12. Social worker and Futures trader to work on placement issues 13. 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 This is not a cellulitis. Traimcinolone cream.   Patient changed his CODE STATUS to DO NOT RESUSCITATE. I will order. Code Status:     Code Status Orders        Start     Ordered   05/21/16 1937  Full code  Continuous     05/21/16 1936    Code Status History    Date Active Date Inactive Code Status Order ID Comments User Context   04/23/2016  4:22 PM 04/24/2016 12:58 PM Full Code [id number]  Auburn Bilberry, MD ED   02/21/2015  3:37 PM 02/24/2015  4:42 PM Full Code [id number]  Shaune Pollack, MD Inpatient      Disposition Plan:  Evaluate daily for disposition   Antibiotics:  Oral vancomycin    IV Cipro  Time spent: 35 minutes  [facility]  [facility]

## 2016-05-26 NOTE — Clinical Social Work Note (Addendum)
Patient has received a pasrr and patient has had bed offers. Patient has stated he has [facility] the past and would like to go there at discharge. Bed offer accepted. As a note, it is documented that patient has a legal guardian however, patient does not have a legal guardian and he is his own responsible party as confirmed by Ardyth GalQueen Love at patient's family care home and by patient. York SpanielMonica Deklan Minar MSW,LCSW [phone]

## 2016-05-27 DIAGNOSIS — R531 Weakness: Secondary | ICD-10-CM

## 2016-05-27 DIAGNOSIS — N400 Enlarged prostate without lower urinary tract symptoms: Secondary | ICD-10-CM

## 2016-05-27 DIAGNOSIS — N390 Urinary tract infection, site not specified: Secondary | ICD-10-CM

## 2016-05-27 DIAGNOSIS — N320 Bladder-neck obstruction: Secondary | ICD-10-CM

## 2016-05-27 DIAGNOSIS — N189 Chronic kidney disease, unspecified: Secondary | ICD-10-CM

## 2016-05-27 DIAGNOSIS — I82403 Acute embolism and thrombosis of unspecified deep veins of lower extremity, bilateral: Secondary | ICD-10-CM

## 2016-05-27 DIAGNOSIS — A498 Other bacterial infections of unspecified site: Secondary | ICD-10-CM

## 2016-05-27 DIAGNOSIS — A419 Sepsis, unspecified organism: Secondary | ICD-10-CM

## 2016-05-27 DIAGNOSIS — F319 Bipolar disorder, unspecified: Secondary | ICD-10-CM

## 2016-05-27 DIAGNOSIS — A0472 Enterocolitis due to Clostridium difficile, not specified as recurrent: Secondary | ICD-10-CM

## 2016-05-27 DIAGNOSIS — I872 Venous insufficiency (chronic) (peripheral): Secondary | ICD-10-CM

## 2016-05-27 DIAGNOSIS — N179 Acute kidney failure, unspecified: Secondary | ICD-10-CM

## 2016-05-27 LAB — BASIC METABOLIC PANEL
Anion gap: 6 (ref 5–15)
BUN: 22 mg/dL — AB (ref 6–20)
CHLORIDE: 108 mmol/L (ref 101–111)
CO2: 23 mmol/L (ref 22–32)
Calcium: 7.9 mg/dL — ABNORMAL LOW (ref 8.9–10.3)
Creatinine, Ser: 1.16 mg/dL (ref 0.61–1.24)
GFR calc Af Amer: >60 mL/min (ref >60)
GFR calc non Af Amer: >60 mL/min (ref >60)
GLUCOSE: 165 mg/dL — AB (ref 65–99)
POTASSIUM: 2.9 mmol/L — AB (ref 3.5–5.1)
Sodium: 137 mmol/L (ref 135–145)

## 2016-05-27 LAB — CBC
HEMATOCRIT: 31.6 % — AB (ref 40.0–52.0)
Hemoglobin: 10.8 g/dL — ABNORMAL LOW (ref 13.0–18.0)
MCH: 29.3 pg (ref 26.0–34.0)
MCHC: 34.2 g/dL (ref 32.0–36.0)
MCV: 85.7 fL (ref 80.0–100.0)
Platelets: 210 10*3/uL (ref 150–440)
RBC: 3.68 MIL/uL — ABNORMAL LOW (ref 4.40–5.90)
RDW: 15.5 % — AB (ref 11.5–14.5)
WBC: 9.5 10*3/uL (ref 3.8–10.6)

## 2016-05-27 LAB — PLATELET COUNT: PLATELETS: 217 10*3/uL (ref 150–440)

## 2016-05-27 LAB — MAGNESIUM: Magnesium: 1.5 mg/dL — ABNORMAL LOW (ref 1.7–2.4)

## 2016-05-27 MED ORDER — APIXABAN 5 MG PO TABS: 10.0000 mg | ORAL_TABLET | Freq: Two times a day (BID) | ORAL | 0 refills | Status: DC

## 2016-05-27 MED ORDER — VANCOMYCIN 50 MG/ML ORAL SOLUTION: 125.0000 mg | Freq: Four times a day (QID) | ORAL | 0 refills | Status: DC

## 2016-05-27 MED ORDER — POTASSIUM CHLORIDE 10 MEQ/100ML IV SOLN
10.0000 meq | INTRAVENOUS | Status: AC
  Administered 2016-05-27 (×2): 10 meq via INTRAVENOUS
  Filled 2016-05-27 (×4): qty 100

## 2016-05-27 MED ORDER — CARVEDILOL 6.25 MG PO TABS
12.5000 mg | ORAL_TABLET | Freq: Two times a day (BID) | ORAL | Status: DC
  Administered 2016-05-27 – 2016-05-28 (×2): 12.5 mg via ORAL
  Filled 2016-05-27 (×3): qty 2

## 2016-05-27 MED ORDER — PREMIER PROTEIN SHAKE
2.0000 [oz_av] | Freq: Four times a day (QID) | ORAL | Status: DC
  Administered 2016-05-28 (×2): 2 [oz_av] via ORAL

## 2016-05-27 MED ORDER — TAMSULOSIN HCL 0.4 MG PO CAPS
0.8000 mg | ORAL_CAPSULE | Freq: Every day | ORAL | 6 refills | Status: DC
Start: 2016-05-27 — End: 2016-09-22

## 2016-05-27 MED ORDER — APIXABAN 5 MG PO TABS: 5.0000 mg | ORAL_TABLET | Freq: Two times a day (BID) | ORAL | 6 refills | Status: DC

## 2016-05-27 MED ORDER — MAGNESIUM SULFATE 4 GM/100ML IV SOLN
4.0000 g | Freq: Two times a day (BID) | INTRAVENOUS | Status: DC
  Administered 2016-05-28: 4 g via INTRAVENOUS
  Filled 2016-05-27 (×2): qty 100

## 2016-05-27 NOTE — Progress Notes (Signed)
Nutrition Follow-up  DOCUMENTATION CODES:   Severe malnutrition in context of chronic illness  INTERVENTION:  Continue 2% milk with all trays.   Added Premier Protein po once daily, each supplement provides 160 kcal and 30 grams protein.  NUTRITION DIAGNOSIS:   Malnutrition related to acute illness as evidenced by percent weight loss, severe fluid accumulation.  Ongoing.  GOAL:   Patient will meet greater than or equal to 90% of their needs  Progressing.  MONITOR:   PO intake, I & O's, Labs, Weight trends  REASON FOR ASSESSMENT:   Malnutrition Screening Tool    ASSESSMENT:   [name]  is a 75 y.o. male with a known history of Bipolar disorder. He has recently been in the hospital and was seen for acute renal failure and lower extremity edema. He had a fall at his facility and EMS thought he was breathing hard and put him on BiPAP  Patient reports his appetite is "okay." Denies nausea or abdominal pain. Reports diarrhea is improved. He reports that he is eating most of his meals - verified by chart has had 75-100% of most meals with occasionally eating only 25%. He reports that he prefers 2% milk over Whole Milk, but it is not always coming on the trays.   Meal Completion: 25-100% In the past 24 hrs patient has had 1289 kcal (83% minimum estimated kcal needs), and 46 grams protein (51% minimum estimated protein needs).   Medications reviewed and include: Vitamin D 1000 units daily, pantoprazole.  Labs reviewed: Potassium 2.9, BUN 22, Glucose 165.  Diet Order:  Diet Heart Room service appropriate? Yes; Fluid consistency: Thin Diet - low sodium heart healthy  Skin:  Reviewed, no issues  Last BM:  05/26/2016  Height:   Ht Readings from Last 1 Encounters:  05/21/16 5\' 10"  (1.778 m)    Weight:   Wt Readings from Last 1 Encounters:  05/21/16 260 lb (117.9 kg)    Ideal Body Weight:  75.45 kg  BMI:  Body mass index is 37.31 kg/m.  Estimated  Nutritional Needs:   Kcal:  1550-1850 calories  Protein:  90-115 gm  Fluid:  >/= 1.6L  EDUCATION NEEDS:   No education needs identified at this time  Helane RimaLeanne Shealeigh Dunstan, MS, RD, LDN Pager: [phone] After Hours Pager: [phone]

## 2016-05-27 NOTE — Progress Notes (Signed)
Patient ID: [name], male   DOB: 1940-08-12, 75 y.o.   MRN: [id number]   [employer] PROGRESS NOTE  [name]:[id number] DOB: December 03, 1940 DOA: 05/21/2016 PCP: Lorenda Ishihara, MD  HPI/Subjective: Diarrhea has subsided. Infectious disease specialist feels that the vibrio cholera test was false-positive. Left leg pain is some better. Overall, the patient feels improved. Patient does have Foley catheter draining darker Amber urine. Oral intake is poor, 25-75%.  Objective: Vitals:   05/27/16 0112 05/27/16 0428  BP: (!) 145/69 (!) 148/66  Pulse: 78 84  Resp: (!) 22 20  Temp:  100.3 F (37.9 C)    Filed Weights   05/21/16 2114  Weight: 117.9 kg (260 lb)    ROS: Review of Systems  Constitutional: Negative for chills and fever.  Eyes: Negative for blurred vision.  Respiratory: Negative for cough and shortness of breath.   Cardiovascular: Negative for chest pain.  Gastrointestinal: Positive for diarrhea. Negative for abdominal pain, constipation, nausea and vomiting.  Musculoskeletal: Positive for back pain and myalgias. Negative for joint pain.  Neurological: Negative for dizziness and headaches.   Exam: Physical Exam  HENT:  Nose: No mucosal edema.  Mouth/Throat: No oropharyngeal exudate or posterior oropharyngeal edema.  Eyes: Conjunctivae, EOM and lids are normal. Pupils are equal, round, and reactive to light.  Neck: No JVD present. Carotid bruit is not present. No edema present. No thyroid mass and no thyromegaly present.  Cardiovascular: S1 normal and S2 normal.  Exam reveals no gallop.   Murmur heard.  Systolic murmur is present with a grade of 4/6  Pulses:      Dorsalis pedis pulses are 2+ on the right side, and 2+ on the left side.  Respiratory: No respiratory distress. He has decreased breath sounds in the right lower field and the left lower field. He has no wheezes. He has no rhonchi. He has no rales.  GI: Soft. Bowel sounds are normal.  There is no tenderness.  Musculoskeletal:       Right ankle: He exhibits swelling.       Left ankle: He exhibits swelling.  Patient able to straight leg raise today.  Lymphadenopathy:    He has no cervical adenopathy.  Neurological: He is alert. No cranial nerve deficit.  Skin: Skin is warm. Nails show no clubbing.  Bilateral lower extremity erythema.  Psychiatric: He has a normal mood and affect.      Data Reviewed: Basic Metabolic Panel:  Recent Labs Lab 05/21/16 1756 05/22/16 0358 05/23/16 0353 05/24/16 0416 05/25/16 0434  NA 139 139 141 139 139  K 4.7 4.4 4.9 3.4* 3.4*  CL 106 108 112* 111 109  CO2 23 21* 20* 20* 24  GLUCOSE 103* 95 112* 102* 110*  BUN 64* 57* 47* 40* 35*  CREATININE 4.90* 3.83* 2.15* 1.47* 1.47*  CALCIUM 8.8* 8.5* 8.2* 8.2* 8.2*   Liver Function Tests:  Recent Labs Lab 05/21/16 1756  AST 19  ALT 8*  ALKPHOS 102  BILITOT 0.8  PROT 7.8  ALBUMIN 3.6    Recent Labs Lab 05/21/16 1756  LIPASE 41   CBC:  Recent Labs Lab 05/21/16 1756 05/22/16 0358 05/27/16 0529  WBC 10.1 9.5  --   NEUTROABS 8.3*  --   --   HGB 12.5* 12.9*  --   HCT 37.6* 38.6*  --   MCV 87.8 89.4  --   PLT 172 124* 217   Cardiac Enzymes:  Recent Labs Lab 05/21/16 1756  TROPONINI  0.03*   BNP (last 3 results)  Recent Labs  05/21/16 1756  BNP 92.0     Recent Results (from the past 240 hour(s))  Culture, blood (routine x 2)     Status: None   Collection Time: 05/21/16  5:56 PM  Result Value Ref Range Status   Specimen Description BLOOD RIGHT ANTECUBITAL  Final   Special Requests BOTTLES DRAWN AEROBIC AND ANAEROBIC  10CC  Final   Culture NO GROWTH 5 DAYS  Final   Report Status 05/26/2016 FINAL  Final  Urine culture     Status: Abnormal   Collection Time: 05/21/16  5:56 PM  Result Value Ref Range Status   Specimen Description URINE, RANDOM  Final   Special Requests NONE  Final   Culture (A)  Final    80,000 COLONIES/mL ESCHERICHIA COLI 20,000  COLONIES/mL VANCOMYCIN RESISTANT ENTEROCOCCUS    Report Status 05/26/2016 FINAL  Final   Organism ID, Bacteria ESCHERICHIA COLI (A)  Final   Organism ID, Bacteria VANCOMYCIN RESISTANT ENTEROCOCCUS (A)  Final      Susceptibility   Escherichia coli - MIC*    AMPICILLIN >=32 RESISTANT Resistant     CEFAZOLIN <=4 SENSITIVE Sensitive     CEFTRIAXONE <=1 SENSITIVE Sensitive     CIPROFLOXACIN <=0.25 SENSITIVE Sensitive     GENTAMICIN <=1 SENSITIVE Sensitive     IMIPENEM <=0.25 SENSITIVE Sensitive     NITROFURANTOIN <=16 SENSITIVE Sensitive     TRIMETH/SULFA <=20 SENSITIVE Sensitive     AMPICILLIN/SULBACTAM 16 INTERMEDIATE Intermediate     PIP/TAZO <=4 SENSITIVE Sensitive     Extended ESBL NEGATIVE Sensitive     * 80,000 COLONIES/mL ESCHERICHIA COLI   Vancomycin resistant enterococcus - MIC*    AMPICILLIN >=32 RESISTANT Resistant     LEVOFLOXACIN >=8 RESISTANT Resistant     NITROFURANTOIN 256 RESISTANT Resistant     VANCOMYCIN >=32 RESISTANT Resistant     LINEZOLID 2 SENSITIVE Sensitive     * 20,000 COLONIES/mL VANCOMYCIN RESISTANT ENTEROCOCCUS  Culture, blood (routine x 2)     Status: None   Collection Time: 05/21/16  6:50 PM  Result Value Ref Range Status   Specimen Description BLOOD RIGHT HAND  Final   Special Requests BOTTLES DRAWN AEROBIC AND ANAEROBIC  1CC  Final   Culture NO GROWTH 5 DAYS  Final   Report Status 05/26/2016 FINAL  Final  C difficile quick scan w PCR reflex     Status: Abnormal   Collection Time: 05/24/16  9:49 PM  Result Value Ref Range Status   C Diff antigen POSITIVE (A) NEGATIVE Final   C Diff toxin NEGATIVE NEGATIVE Final   C Diff interpretation Results are indeterminate. See PCR results.  Final  Gastrointestinal Panel by PCR , Stool     Status: None   Collection Time: 05/24/16  9:49 PM  Result Value Ref Range Status   Campylobacter species NOT DETECTED NOT DETECTED Final   Plesimonas shigelloides NOT DETECTED NOT DETECTED Final   Salmonella species NOT  DETECTED NOT DETECTED Final   Yersinia enterocolitica NOT DETECTED NOT DETECTED Final   Vibrio species NOT DETECTED NOT DETECTED Corrected    Comment: CORRECTED RESULTS CALLED TO: [name] DOUGHTERTY 05/26/16 1512 JGF. READ BACK PERFORMED. CORRECTED ON 10/18 AT 1512: PREVIOUSLY REPORTED AS DETECTED RESULT CALLED TO, READ BACK BY AND VERIFIED WITH: MARCELLA TURNER AT 2335 ON 05/24/16 RWW    Vibrio cholerae NOT DETECTED NOT DETECTED Corrected    Comment: CORRECTED RESULTS CALLED TO: [name]  05/1816 1512 JGF. READ BACK PERFORMED. CORRECTED ON 10/18 AT 1512: PREVIOUSLY REPORTED AS DETECTED RESULT CALLED TO, READ BACK BY AND VERIFIED WITH: MARCELLA TURNER AT 2335 ON 05/24/16 RWW    Enteroaggregative E coli (EAEC) NOT DETECTED NOT DETECTED Final   Enteropathogenic E coli (EPEC) NOT DETECTED NOT DETECTED Final   Enterotoxigenic E coli (ETEC) NOT DETECTED NOT DETECTED Final   Shiga like toxin producing E coli (STEC) NOT DETECTED NOT DETECTED Final   Shigella/Enteroinvasive E coli (EIEC) NOT DETECTED NOT DETECTED Final   Cryptosporidium NOT DETECTED NOT DETECTED Final   Cyclospora cayetanensis NOT DETECTED NOT DETECTED Final   Entamoeba histolytica NOT DETECTED NOT DETECTED Final   Giardia lamblia NOT DETECTED NOT DETECTED Final   Adenovirus F40/41 NOT DETECTED NOT DETECTED Final   Astrovirus NOT DETECTED NOT DETECTED Final   Norovirus GI/GII NOT DETECTED NOT DETECTED Final   Rotavirus A NOT DETECTED NOT DETECTED Final   Sapovirus (I, II, IV, and V) NOT DETECTED NOT DETECTED Final  Clostridium Difficile by PCR     Status: Abnormal   Collection Time: 05/24/16  9:49 PM  Result Value Ref Range Status   Toxigenic C Difficile by pcr POSITIVE (A) NEGATIVE Final    Comment: Positive for toxigenic C. difficile with little to no toxin production. Only treat if clinical presentation suggests symptomatic illness. RESULT CALLED TO, READ BACK BY AND VERIFIED WITH: MARCELLA TURNER AT 0030 ON 05/25/16  RWW   Stool culture (children & immunocomp patients)     Status: None (Preliminary result)   Collection Time: 05/25/16  5:00 PM  Result Value Ref Range Status   Salmonella/Shigella Screen PENDING  Incomplete   Campylobacter Culture PENDING  Incomplete   E coli, Shiga toxin Assay Negative Negative Final    Comment: (NOTE) Performed At: [facility] [address] [place], [place] [id number] Mila Homer MD WU:[id number]      Studies: US Abdomen Limited Ruq  Result Date: 05/26/2016 CLINICAL DATA:  Right upper quadrant pain . EXAM: US ABDOMEN LIMITED - RIGHT UPPER QUADRANT COMPARISON:  05/22/2016. FINDINGS: Gallbladder: No gallstones. Gallbladder wall thickened to 4 mm. Gallbladder wall thickening can be seen with hypoproteinemic states and cholecystitis. Common bile duct: Diameter: 4 mm Liver: No focal lesion identified. Within normal limits in parenchymal echogenicity. Left lobe of the liver is obscured by overlying bowel gas. IMPRESSION: 1. No gallstones. Gallbladder wall however is slightly thickened at 4 mm. This can be seen with hyperproteinemia and or cholecystitis. No biliary distention. 2. Liver is unremarkable. Electronically Signed   By: Maisie Fus  Register   On: 05/26/2016 09:56    Scheduled Meds: . apixaban  10 mg Oral BID   Followed by  . [START ON 05/31/2016] apixaban  5 mg Oral BID  . ARIPiprazole  20 mg Oral Daily  . aspirin  81 mg Oral Daily  . buPROPion  300 mg Oral BH-q7a  . carvedilol  12.5 mg Oral BID  . cholecalciferol  1,000 Units Oral Daily  . finasteride  5 mg Oral Daily  . mouth rinse  15 mL Mouth Rinse BID  . pantoprazole  40 mg Oral Daily  . PHENobarbital  129.6 mg Oral QODAY  . pravastatin  40 mg Oral Daily  . sodium chloride flush  3 mL Intravenous Q12H  . tamsulosin  0.8 mg Oral QPC supper  . triamcinolone cream   Topical BID  . vancomycin  125 mg Oral QID    Assessment/Plan:  1. Clinical sepsis  with fever, tachycardia and acute  cystitis with hematuria.  IV Rocephin Switched over to Cipro. 2.  C. Difficile Colitis (vibrio cholera is likely false positive test, per ID). Continue oral vancomycin. Diarrhea has subsided, last bowel movement was 18th of October 2017 3. Bilateral lower extremity DVT. Continue Eliquis .  4. Acute kidney injury which is postobstructive in nature. Bladder scan in the ER showed a greater than 999 in the bladder. Once Foley catheter was placed 3 L drained out.  Creatinine improved to 1.47 is today, rechecking today, patient may need to have IV fluids initiated if kidney function is worse, likely due to poor oral intake. Patient is to continue Foley catheter, finasteride, Flomax. Foley catheter could be withdrawn in about 1 week in physician's office.  5. BPH with bladder outlet obstruction. Continue Flomax, finasteride. Foley will have to stay in at least a week. Will need urology as outpatient. 6. Acute respiratory distress on presentation likely from sepsis. Patient required BiPAP initially but is now breathing comfortably on off oxygen. 7. Essential hypertension on Coreg advance dose, may need to add Norvasc as per home medication regimen 8. Lower extremity edema. Hold off on restarting Lasix since the patient having diarrhea. 9. Bipolar disorder. Continue psychiatric medications 10. 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 Weakness. Physical therapy evaluation is completed, skilled nursing facility was recommended, social worker is involved 12. Social worker and Futures trader to work on placement issues 13. 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 This is not a cellulitis. Traimcinolone cream.   Patient changed his CODE STATUS to DO NOT RESUSCITATE. I will order. Code Status:     Code Status Orders        Start     Ordered   05/21/16 1937  Full code  Continuous     05/21/16 1936    Code Status History    Date Active Date Inactive Code Status  Order ID Comments User Context   04/23/2016  4:22 PM 04/24/2016 12:58 PM Full Code [id number]  Auburn Bilberry, MD ED   02/21/2015  3:37 PM 02/24/2015  4:42 PM Full Code [id number]  Shaune Pollack, MD Inpatient      Disposition Plan:  Evaluate daily for disposition   Antibiotics:  Oral vancomycin    IV Cipro  Time spent: 40 minutes  [facility]  [employer]

## 2016-05-28 DIAGNOSIS — M549 Dorsalgia, unspecified: Secondary | ICD-10-CM

## 2016-05-28 LAB — POTASSIUM
Potassium: 3.2 mmol/L — ABNORMAL LOW (ref 3.5–5.1)
Potassium: 3.8 mmol/L (ref 3.5–5.1)

## 2016-05-28 LAB — MAGNESIUM: Magnesium: 1.5 mg/dL — ABNORMAL LOW (ref 1.7–2.4)

## 2016-05-28 MED ORDER — APIXABAN 5 MG PO TABS
10.0000 mg | ORAL_TABLET | Freq: Two times a day (BID) | ORAL | Status: DC
  Administered 2016-05-28: 10 mg via ORAL
  Filled 2016-05-28: qty 2

## 2016-05-28 MED ORDER — TRAMADOL HCL 50 MG PO TABS: 50.0000 mg | ORAL_TABLET | Freq: Four times a day (QID) | ORAL | 5 refills | Status: DC | PRN

## 2016-05-28 MED ORDER — MAGNESIUM SULFATE 4 GM/100ML IV SOLN
4.0000 g | Freq: Once | INTRAVENOUS | Status: AC
  Administered 2016-05-28: 4 g via INTRAVENOUS
  Filled 2016-05-28: qty 100

## 2016-05-28 MED ORDER — POTASSIUM CHLORIDE 20 MEQ/15ML (10%) PO SOLN
40.0000 meq | ORAL | Status: AC
  Administered 2016-05-28 (×2): 40 meq via ORAL
  Filled 2016-05-28 (×4): qty 30

## 2016-05-28 MED ORDER — TRAMADOL HCL 50 MG PO TABS
50.0000 mg | ORAL_TABLET | Freq: Four times a day (QID) | ORAL | Status: DC | PRN
  Administered 2016-05-28: 50 mg via ORAL
  Filled 2016-05-28: qty 1

## 2016-05-28 MED ORDER — APIXABAN 5 MG PO TABS: 10.0000 mg | ORAL_TABLET | Freq: Two times a day (BID) | ORAL | 0 refills | Status: DC

## 2016-05-28 MED ORDER — APIXABAN 5 MG PO TABS: 5.0000 mg | ORAL_TABLET | Freq: Two times a day (BID) | ORAL | Status: DC

## 2016-05-28 NOTE — Progress Notes (Signed)
05/28/2016 2:14 PM  Call[facility] report to [facility].  Answered her questions and provided a phone number for additional information.  Bradly Chrisougherty, Jeslie Lowe E, RN

## 2016-05-28 NOTE — Clinical Social Work Note (Signed)
Patient is to [facility] today [facility]. [facility] and nurse to call report. [facility]. Patient on isolation precautions and will transport via EMS. York SpanielMonica Kanna Dafoe MSW,LCSW [phone]

## 2016-05-28 NOTE — Discharge Summary (Signed)
[facility] - [place] at [facility]   PATIENT NAME: [name]    MR#:  [id number]  DATE OF BIRTH:  [birth year]  DATE OF ADMISSION:  05/21/2016 ADMITTING PHYSICIAN: Alford Highland, MD  DATE OF DISCHARGE: No discharge date for patient encounter.  PRIMARY CARE PHYSICIAN: Lorenda Ishihara, MD     ADMISSION DIAGNOSIS:  Respiratory distress [R06.00] SOB (shortness of breath) [R06.02] Acute cystitis without hematuria [N30.00] Fall, initial encounter [W19.XXXA]  DISCHARGE DIAGNOSIS:  Principal Problem:   Sepsis (HCC) Active Problems:   UTI (urinary tract infection)   Escherichia coli (E. coli) infection   Bladder outlet obstruction   Acute on chronic renal failure (HCC)   Acute kidney injury (HCC)   DVT of lower extremity, bilateral (HCC)   Generalized weakness   C. difficile colitis   Back pain   Chronic venous stasis dermatitis   BPH (benign prostatic hyperplasia)   Bipolar disorder (HCC)   SECONDARY DIAGNOSIS:   Past Medical History:  Diagnosis Date  . Bipolar 1 disorder (HCC)   . CHF (congestive heart failure) (HCC)   . COPD (chronic obstructive pulmonary disease) (HCC)   . DVT (deep venous thrombosis) (HCC)   . GERD (gastroesophageal reflux disease)   . High cholesterol   . Hypertension   . Schizophrenia (HCC)   . Seizure (HCC)     .pro HOSPITAL COURSE:   The patient is 75 year old male with medical history significant for history of bipolar disorder, congestive heart failure, COPD, DVT, hyperlipidemia, hypertension, who presents to the hospital with complaints of fall and respiratory distress. Apparently patient had a fall in the facility and he was brought to emergency room for further evaluation, since patient was short of breath, he required BiPAP administration. He complained of back, leg pain, and waist pain, dizziness. Urinalysis revealed pyuria, urinary cultures showed 80,000 colony-forming units of Escherichia coli,  patient completed the Rocephin course. On arrival to the hospital, the patient's creatinine was found to be elevated at 4.9. On physical exam, patient was noted to have lower abdominal distention, discomfort, bladder scan was performed, more than 1 L of fluid was noted in the bladder, Foley catheter was placed and at least 3 L of urine was drained out, concerning for bladder outlet obstruction. Patient was initiated on Flomax and finasteride, Foley catheter was recommended to be continued for at least 1 week and then was drawn in urologist or primary care physician's office. While in the hospital, patient complained of groin pain, he was noted to have bilateral lower extremity swelling, worse in the left side, ultrasound of lower extremities was performed and patient was noted to have bilateral lower extremity DVT. Left lower extremity DVT was found to be occlusive. Patient was initiated on a Eliquis , the patient is to continue loading dose of a Eliquis at 10 mg twice daily for 6 more doses, then initiate 5 mg twice daily dosing, starting 23rd of October 2017. While in the hospital, patient developed diarrhea, which was positive for C. difficile, vancomycin was initiated, patient is to continue vancomycin orally for 13 more days to complete course. Diarrhea has subsided. Patient was seen by physical therapist and recommended skilled nursing facility placement, where he is going to be discharged today. Discussion by problem: 1. Clinical sepsis with fever, tachycardia and acute cystitis with hematuria.  IV Rocephin was initiated, later switched over to Cipro, patient completed the course of antibiotic therapy for urinary tract infection. Urine cultures revealed Escherichia coli 80,000 colony-forming units  and VRE 20,000 colony-forming units, which was not treated. 2.  C. Difficile Colitis. Continue oral vancomycin. Diarrhea has subsided, last bowel movement was 20th of October 2017, loose, brown, small amount.  Oral intake remains low from 25-75%.  3. Bilateral lower extremity DVT. Continue Eliquis, patient is to continue loading dose for 3 more days, then 5 mg twice daily maintenance dose .  4. Acute kidney injury which is postobstructive in nature due to bladder outlet obstruction. Bladder scan in the ER showed a greater than 999 in the bladder. Once Foley catheter was placed 3 L drained out.  Creatinine improved to 1.16 yesterday. Patient is to continue Foley catheter, finasteride, Flomax. Foley catheter could be withdrawn in about 1 week in physician's office. It is recommended to follow patient's creatinine closely as outpatient and follow his oral intake as well. 5. BPH with bladder outlet obstruction. Continue Flomax, finasteride. Foley will have to stay in at least a week. Will need urology as outpatient. 6. Acute respiratory distress on presentation likely due to sepsis. Patient required BiPAP initially but is now breathing comfortably on off oxygen. 7. Essential hypertension , resumed Coreg  Norvasc as per home medication regimen 8. Lower extremity edema. Hold off on restarting Lasix since the patient is still having some diarrheal stool. 9. Bipolar disorder. Continue psychiatric medications, no changes were made 10. 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 Weakness. Physical therapy evaluation is completed, skilled nursing facility was recommended, social worker is involved, discharge to skilled nursing facility for rehabilitation today. Social Programme researcher, broadcasting/film/video to work on placement issues 12. 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 This is not a cellulitis. Traimcinolone cream as needed. 13. Hypokalemia, hypomagnesemia, supplement orally and IV, check potassium level later today 14. Back pain, initiate patient on tramadol. 15. Thickened gallbladder on ultrasound, normal liver enzymes, no leukocytosis or fevers, clinically not painful  on  abdominal exam, unlikely cholecystitis, follow closely, refer to surgery if needed  DISCHARGE CONDITIONS:   Stable  CONSULTS OBTAINED:  Treatment Team:  Mick Sell, MD  DRUG ALLERGIES:   Allergies  Allergen Reactions  . Penicillin G Other (See Comments)    Convulsions. Has patient had a PCN reaction causing immediate rash, facial/tongue/throat swelling, SOB or lightheadedness with hypotension: not sure Has patient had a PCN reaction causing severe rash involving mucus membranes or skin necrosis: not sure Has patient had a PCN reaction that required hospitalization: not sure Has patient had a PCN reaction occurring within the last 10 years: not sure If all of the above answers are "NO", then [name] proceed with Cephalosporin use.    DISCHARGE MEDICATIONS:   Current Discharge Medication List    START taking these medications   Details  !! apixaban (ELIQUIS) 5 MG TABS tablet Take 1 tablet (5 mg total) by mouth 2 (two) times daily. Qty: 60 tablet, Refills: 6    !! apixaban (ELIQUIS) 5 MG TABS tablet Take 2 tablets (10 mg total) by mouth 2 (two) times daily. Qty: 12 tablet, Refills: 0    tamsulosin (FLOMAX) 0.4 MG CAPS capsule Take 2 capsules (0.8 mg total) by mouth daily after supper. Qty: 30 capsule, Refills: 6    traMADol (ULTRAM) 50 MG tablet Take 1 tablet (50 mg total) by mouth every 6 (six) hours as needed for moderate pain or severe pain. Qty: 30 tablet, Refills: 5    vancomycin (VANCOCIN) 50 mg/mL oral solution Take 2.5 mLs (125 mg total) by  mouth 4 (four) times daily. Qty: 130 mL, Refills: 0     !! - Potential duplicate medications found. Please discuss with provider.    CONTINUE these medications which have NOT CHANGED   Details  acetaminophen (TYLENOL) 325 MG tablet Take 2 tablets (650 mg total) by mouth every 6 (six) hours as needed. Qty: 90 tablet, Refills: 0    albuterol (VENTOLIN HFA) 108 (90 Base) MCG/ACT inhaler Inhale 2 puffs into the lungs every 6  (six) hours as needed for shortness of breath.    amLODipine (NORVASC) 2.5 MG tablet Take 1 tablet (2.5 mg total) by mouth daily. Qty: 30 tablet, Refills: 0    ARIPiprazole (ABILIFY) 20 MG tablet Take 1 tablet (20 mg total) by mouth daily. Qty: 30 tablet, Refills: 0    aspirin 81 MG chewable tablet Chew 81 mg by mouth daily.    buPROPion (BUDEPRION XL) 300 MG 24 hr tablet Take 1 tablet (300 mg total) by mouth daily. Qty: 30 tablet, Refills: 0    carvedilol (COREG) 25 MG tablet Take 1 tablet (25 mg total) by mouth 2 (two) times daily. Qty: 60 tablet, Refills: 0    Cholecalciferol (VITAMIN D-1000 MAX ST) 1000 units tablet Take 1 tablet (1,000 Units total) by mouth daily. Qty: 30 tablet, Refills: 0    finasteride (PROSCAR) 5 MG tablet Take 1 tablet (5 mg total) by mouth daily. Qty: 30 tablet, Refills: 0    omeprazole (PRILOSEC) 20 MG capsule Take 2 capsules (40 mg total) by mouth daily. Qty: 30 capsule, Refills: 0    PHENobarbital (LUMINAL) 64.8 MG tablet Take 2 tablets (129.6 mg total) by mouth every other day. Qty: 30 tablet, Refills: 0    polyethylene glycol (MIRALAX / GLYCOLAX) packet Take 17 g by mouth daily as needed for moderate constipation. Qty: 30 each, Refills: 0    pravastatin (PRAVACHOL) 40 MG tablet Take 1 tablet (40 mg total) by mouth daily. Qty: 30 tablet, Refills: 0      STOP taking these medications     furosemide (LASIX) 40 MG tablet      potassium chloride (K-DUR,KLOR-CON) 10 MEQ tablet      terazosin (HYTRIN) 5 MG capsule          DISCHARGE INSTRUCTIONS:    The patient is to follow-up with primary care physician, urologist as outpatient  If you experience worsening of your admission symptoms, develop shortness of breath, life threatening emergency, suicidal or homicidal thoughts you must seek medical attention immediately by calling 911 or calling your MD immediately  if symptoms less severe.  You Must read complete instructions/literature along  with all the possible adverse reactions/side effects for all the Medicines you take and that have been prescribed to you. Take any new Medicines after you have completely understood and accept all the possible adverse reactions/side effects.   Please note  You were cared for by a hospitalist during your hospital stay. If you have any questions about your discharge medications or the care you received while you were in the hospital after you are discharged, you can call the unit and asked to speak with the hospitalist on call if the hospitalist that took care of you is not available. Once you are discharged, your primary care physician will handle any further medical issues. Please note that NO REFILLS for any discharge medications will be authorized once you are discharged, as it is imperative that you return to your primary care physician (or establish a relationship with a  primary care physician if you do not have one) for your aftercare needs so that they can reassess your need for medications and monitor your lab values.    Today   CHIEF COMPLAINT:   Chief Complaint  Patient presents with  . Fall  . Respiratory Distress    HISTORY OF PRESENT ILLNESS:  [name]  is a 75 y.o. male with a known history of bipolar disorder, congestive heart failure, COPD, DVT, hyperlipidemia, hypertension, who presents to the hospital with complaints of fall and respiratory distress. Apparently patient had a fall in the facility and he was brought to emergency room for further evaluation, since patient was short of breath, he required BiPAP administration. He complained of back, leg pain, and waist pain, dizziness. Urinalysis revealed pyuria, urinary cultures showed 80,000 colony-forming units of Escherichia coli, patient completed the Rocephin course. On arrival to the hospital, the patient's creatinine was found to be elevated at 4.9. On physical exam, patient was noted to have lower abdominal distention,  discomfort, bladder scan was performed, more than 1 L of fluid was noted in the bladder, Foley catheter was placed and at least 3 L of urine was drained out, concerning for bladder outlet obstruction. Patient was initiated on Flomax and finasteride, Foley catheter was recommended to be continued for at least 1 week and then was drawn in urologist or primary care physician's office. While in the hospital, patient complained of groin pain, he was noted to have bilateral lower extremity swelling, worse in the left side, ultrasound of lower extremities was performed and patient was noted to have bilateral lower extremity DVT. Left lower extremity DVT was found to be occlusive. Patient was initiated on a Eliquis , the patient is to continue loading dose of a Eliquis at 10 mg twice daily for 6 more doses, then initiate 5 mg twice daily dosing, starting 23rd of October 2017. While in the hospital, patient developed diarrhea, which was positive for C. difficile, vancomycin was initiated, patient is to continue vancomycin orally for 13 more days to complete course. Diarrhea has subsided. Patient was seen by physical therapist and recommended skilled nursing facility placement, where he is going to be discharged today. Discussion by problem: 16. Clinical sepsis with fever, tachycardia and acute cystitis with hematuria.  IV Rocephin was initiated, later switched over to Cipro, patient completed the course of antibiotic therapy for urinary tract infection. Urine cultures revealed Escherichia coli 80,000 colony-forming units and VRE 20,000 colony-forming units, which was not treated. 17.  C. Difficile Colitis. Continue oral vancomycin. Diarrhea has subsided, last bowel movement was 20th of October 2017, loose, brown, small amount. Oral intake remains low from 25-75%.  18. Bilateral lower extremity DVT. Continue Eliquis, patient is to continue loading dose for 3 more days, then 5 mg twice daily maintenance dose .  19. Acute  kidney injury which is postobstructive in nature due to bladder outlet obstruction. Bladder scan in the ER showed a greater than 999 in the bladder. Once Foley catheter was placed 3 L drained out.  Creatinine improved to 1.16 yesterday. Patient is to continue Foley catheter, finasteride, Flomax. Foley catheter could be withdrawn in about 1 week in physician's office. It is recommended to follow patient's creatinine closely as outpatient and follow his oral intake as well. 20. BPH with bladder outlet obstruction. Continue Flomax, finasteride. Foley will have to stay in at least a week. Will need urology as outpatient. 21. Acute respiratory distress on presentation likely due to sepsis. Patient  required BiPAP initially but is now breathing comfortably on off oxygen. 22. Essential hypertension , resumed Coreg  Norvasc as per home medication regimen 23. Lower extremity edema. Hold off on restarting Lasix since the patient is still having some diarrheal stool. 24. Bipolar disorder. Continue psychiatric medications, no changes were made 25. 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 Weakness. Physical therapy evaluation is completed, skilled nursing facility was recommended, social worker is involved, discharge to skilled nursing facility for rehabilitation today. Social worker and Futures trader to work on placement issues 27. 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 This is not a cellulitis. Traimcinolone cream as needed. 28. Hypokalemia, hypomagnesemia, supplement orally and IV, check potassium level later today 29. Back pain, initiate patient on tramadol. 30. Thickened gallbladder on ultrasound, normal liver enzymes, no leukocytosis or fevers, clinically not painful  on abdominal exam, unlikely cholecystitis, follow closely, refer to surgery if needed     VITAL SIGNS:  Blood pressure 130/68, pulse 72, temperature 98.3 F (36.8 C), temperature source Oral,  resp. rate 16, height 5\' 10"  (1.778 m), weight 117.9 kg (260 lb), SpO2 99 %.  I/O:   Intake/Output Summary (Last 24 hours) at 05/28/16 1002 Last data filed at 05/28/16 0600  Gross per 24 hour  Intake              223 ml  Output              750 ml  Net             -527 ml    PHYSICAL EXAMINATION:  GENERAL:  75 y.o.-year-old patient lying in the bed with no acute distress.  EYES: Pupils equal, round, reactive to light and accommodation. No scleral icterus. Extraocular muscles intact.  HEENT: Head atraumatic, normocephalic. Oropharynx and nasopharynx clear.  NECK:  Supple, no jugular venous distention. No thyroid enlargement, no tenderness.  LUNGS: Normal breath sounds bilaterally, no wheezing, rales,rhonchi or crepitation. No use of accessory muscles of respiration.  CARDIOVASCULAR: S1, S2 normal. No murmurs, rubs, or gallops.  ABDOMEN: Soft, non-tender, non-distended. Bowel sounds present. No organomegaly or mass.  EXTREMITIES: No pedal edema, cyanosis, or clubbing.  NEUROLOGIC: Cranial nerves II through XII are intact. Muscle strength 5/5 in all extremities. Sensation intact. Gait not checked.  PSYCHIATRIC: The patient is alert and oriented x 3.  SKIN: No obvious rash, lesion, or ulcer.   DATA REVIEW:   CBC  Recent Labs Lab 05/27/16 1138  WBC 9.5  HGB 10.8*  HCT 31.6*  PLT 210    Chemistries   Recent Labs Lab 05/21/16 1756  05/27/16 1138 05/28/16 0453  NA 139  < > 137  --   K 4.7  < > 2.9* 3.2*  CL 106  < > 108  --   CO2 23  < > 23  --   GLUCOSE 103*  < > 165*  --   BUN 64*  < > 22*  --   CREATININE 4.90*  < > 1.16  --   CALCIUM 8.8*  < > 7.9*  --   MG  --   < > 1.5* 1.5*  AST 19  --   --   --   ALT 8*  --   --   --   ALKPHOS 102  --   --   --   BILITOT 0.8  --   --   --   < > = values in this interval not  displayed.  Cardiac Enzymes  Recent Labs Lab 05/21/16 1756  TROPONINI 0.03*    Microbiology Results  Results for orders placed or performed  during the hospital encounter of 05/21/16  Culture, blood (routine x 2)     Status: None   Collection Time: 05/21/16  5:56 PM  Result Value Ref Range Status   Specimen Description BLOOD RIGHT ANTECUBITAL  Final   Special Requests BOTTLES DRAWN AEROBIC AND ANAEROBIC  10CC  Final   Culture NO GROWTH 5 DAYS  Final   Report Status 05/26/2016 FINAL  Final  Urine culture     Status: Abnormal   Collection Time: 05/21/16  5:56 PM  Result Value Ref Range Status   Specimen Description URINE, RANDOM  Final   Special Requests NONE  Final   Culture (A)  Final    80,000 COLONIES/mL ESCHERICHIA COLI 20,000 COLONIES/mL VANCOMYCIN RESISTANT ENTEROCOCCUS    Report Status 05/26/2016 FINAL  Final   Organism ID, Bacteria ESCHERICHIA COLI (A)  Final   Organism ID, Bacteria VANCOMYCIN RESISTANT ENTEROCOCCUS (A)  Final      Susceptibility   Escherichia coli - MIC*    AMPICILLIN >=32 RESISTANT Resistant     CEFAZOLIN <=4 SENSITIVE Sensitive     CEFTRIAXONE <=1 SENSITIVE Sensitive     CIPROFLOXACIN <=0.25 SENSITIVE Sensitive     GENTAMICIN <=1 SENSITIVE Sensitive     IMIPENEM <=0.25 SENSITIVE Sensitive     NITROFURANTOIN <=16 SENSITIVE Sensitive     TRIMETH/SULFA <=20 SENSITIVE Sensitive     AMPICILLIN/SULBACTAM 16 INTERMEDIATE Intermediate     PIP/TAZO <=4 SENSITIVE Sensitive     Extended ESBL NEGATIVE Sensitive     * 80,000 COLONIES/mL ESCHERICHIA COLI   Vancomycin resistant enterococcus - MIC*    AMPICILLIN >=32 RESISTANT Resistant     LEVOFLOXACIN >=8 RESISTANT Resistant     NITROFURANTOIN 256 RESISTANT Resistant     VANCOMYCIN >=32 RESISTANT Resistant     LINEZOLID 2 SENSITIVE Sensitive     * 20,000 COLONIES/mL VANCOMYCIN RESISTANT ENTEROCOCCUS  Culture, blood (routine x 2)     Status: None   Collection Time: 05/21/16  6:50 PM  Result Value Ref Range Status   Specimen Description BLOOD RIGHT HAND  Final   Special Requests BOTTLES DRAWN AEROBIC AND ANAEROBIC  1CC  Final   Culture NO GROWTH 5  DAYS  Final   Report Status 05/26/2016 FINAL  Final  C difficile quick scan w PCR reflex     Status: Abnormal   Collection Time: 05/24/16  9:49 PM  Result Value Ref Range Status   C Diff antigen POSITIVE (A) NEGATIVE Final   C Diff toxin NEGATIVE NEGATIVE Final   C Diff interpretation Results are indeterminate. See PCR results.  Final  Gastrointestinal Panel by PCR , Stool     Status: None   Collection Time: 05/24/16  9:49 PM  Result Value Ref Range Status   Campylobacter species NOT DETECTED NOT DETECTED Final   Plesimonas shigelloides NOT DETECTED NOT DETECTED Final   Salmonella species NOT DETECTED NOT DETECTED Final   Yersinia enterocolitica NOT DETECTED NOT DETECTED Final   Vibrio species NOT DETECTED NOT DETECTED Corrected    Comment: CORRECTED RESULTS CALLED TO: JACK DOUGHTERTY 05/26/16 1512 JGF. READ BACK PERFORMED. CORRECTED ON 10/18 AT 1512: PREVIOUSLY REPORTED AS DETECTED RESULT CALLED TO, READ BACK BY AND VERIFIED WITH: MARCELLA TURNER AT 2335 ON 05/24/16 RWW    Vibrio cholerae NOT DETECTED NOT DETECTED Corrected    Comment: CORRECTED RESULTS  CALLED TO: JACK DOUGHERTY 05/1816 1512 JGF. READ BACK PERFORMED. CORRECTED ON 10/18 AT 1512: PREVIOUSLY REPORTED AS DETECTED RESULT CALLED TO, READ BACK BY AND VERIFIED WITH: MARCELLA TURNER AT 2335 ON 05/24/16 RWW    Enteroaggregative E coli (EAEC) NOT DETECTED NOT DETECTED Final   Enteropathogenic E coli (EPEC) NOT DETECTED NOT DETECTED Final   Enterotoxigenic E coli (ETEC) NOT DETECTED NOT DETECTED Final   Shiga like toxin producing E coli (STEC) NOT DETECTED NOT DETECTED Final   Shigella/Enteroinvasive E coli (EIEC) NOT DETECTED NOT DETECTED Final   Cryptosporidium NOT DETECTED NOT DETECTED Final   Cyclospora cayetanensis NOT DETECTED NOT DETECTED Final   Entamoeba histolytica NOT DETECTED NOT DETECTED Final   Giardia lamblia NOT DETECTED NOT DETECTED Final   Adenovirus F40/41 NOT DETECTED NOT DETECTED Final   Astrovirus NOT  DETECTED NOT DETECTED Final   Norovirus GI/GII NOT DETECTED NOT DETECTED Final   Rotavirus A NOT DETECTED NOT DETECTED Final   Sapovirus (I, II, IV, and V) NOT DETECTED NOT DETECTED Final  Clostridium Difficile by PCR     Status: Abnormal   Collection Time: 05/24/16  9:49 PM  Result Value Ref Range Status   Toxigenic C Difficile by pcr POSITIVE (A) NEGATIVE Final    Comment: Positive for toxigenic C. difficile with little to no toxin production. Only treat if clinical presentation suggests symptomatic illness. RESULT CALLED TO, READ BACK BY AND VERIFIED WITH: MARCELLA TURNER AT 0030 ON 05/25/16 RWW   Stool culture (children & immunocomp patients)     Status: None (Preliminary result)   Collection Time: 05/25/16  5:00 PM  Result Value Ref Range Status   Salmonella/Shigella Screen Final report  Final    Comment: (NOTE) Performed At: [facility] [address] [place], [place] [id number] Mila Homer MD NW:[id number]    Campylobacter Culture PENDING  Incomplete   E coli, Shiga toxin Assay Negative Negative Final    Comment: (NOTE) Performed At: [facility] [address] [place], [place] [id number] Mila Homer MD BM:[id number]   STOOL CULTURE REFLEX - [facility]     Status: None   Collection Time: 05/25/16  5:00 PM  Result Value Ref Range Status   Stool Culture result 1 ([facility]) Comment  Final    Comment: (NOTE) No Salmonella or Shigella recovered. Performed At: [facility] [address] [place], [place] [id number] Mila Homer MD IH:[id number]     RADIOLOGY:  No results found.  EKG:   Orders placed or performed during the hospital encounter of 05/21/16  . EKG 12-Lead  . EKG 12-Lead      Management plans discussed with the patient, family and they are in agreement.  CODE STATUS:     Code Status Orders        Start     Ordered   05/24/16 1454  Do not attempt resuscitation (DNR)  Continuous    Question Answer Comment  In  the event of cardiac or respiratory ARREST Do not call a "code blue"   In the event of cardiac or respiratory ARREST Do not perform Intubation, CPR, defibrillation or ACLS   In the event of cardiac or respiratory ARREST Use medication by any route, position, wound care, and other measures to relive pain and suffering. [name] use oxygen, suction and manual treatment of airway obstruction as needed for comfort.   Comments nurse [name] pronounce      05/24/16 1453    Code Status History    Date  Active Date Inactive Code Status Order ID Comments User Context   05/21/2016  7:36 PM 05/24/2016  2:53 PM Full Code [id number]  Alford Highland, MD ED   04/23/2016  4:22 PM 04/24/2016 12:58 PM Full Code [id number]  Auburn Bilberry, MD ED   02/21/2015  3:37 PM 02/24/2015  4:42 PM Full Code [id number]  Shaune Pollack, MD Inpatient      TOTAL TIME TAKING CARE OF THIS PATIENT: 40 minutes.    Katharina Caper M.D on 05/28/2016 at 10:02 AM  Between 7am to 6pm - Pager - [phone]  After 6pm go to [email or web address] - password [email or web address] [facility]  [facility]  Office  [phone]  CC: Primary care physician; Lorenda Ishihara, MD

## 2016-05-28 NOTE — Progress Notes (Addendum)
ANTICOAGULATION CONSULT NOTE - Initial Consult  Pharmacy Consult for Apixaban Indication:  DVT  Allergies  Allergen Reactions  . Penicillin G Other (See Comments)    Convulsions. Has patient had a PCN reaction causing immediate rash, facial/tongue/throat swelling, SOB or lightheadedness with hypotension: not sure Has patient had a PCN reaction causing severe rash involving mucus membranes or skin necrosis: not sure Has patient had a PCN reaction that required hospitalization: not sure Has patient had a PCN reaction occurring within the last 10 years: not sure If all of the above answers are "NO", then may proceed with Cephalosporin use.    Patient Measurements: Height: 5\' 10"  (177.8 cm) Weight: 260 lb (117.9 kg) IBW/kg (Calculated) : 73 Heparin Dosing Weight:    Vital Signs: Temp: 98.7 F (37.1 C) (10/20 0446) Temp Source: Oral (10/20 0446) BP: 136/64 (10/20 0446) Pulse Rate: 76 (10/20 0446)  Labs:  Recent Labs  05/27/16 0529 05/27/16 1138  HGB  --  10.8*  HCT  --  31.6*  PLT 217 210  CREATININE  --  1.16    Estimated Creatinine Clearance: 73 mL/min (by C-G formula based on SCr of 1.16 mg/dL).   Medical History: Past Medical History:  Diagnosis Date  . Bipolar 1 disorder (HCC)   . CHF (congestive heart failure) (HCC)   . COPD (chronic obstructive pulmonary disease) (HCC)   . DVT (deep venous thrombosis) (HCC)   . GERD (gastroesophageal reflux disease)   . High cholesterol   . Hypertension   . Schizophrenia (HCC)   . Seizure (HCC)     Medications:  Scheduled:  . [START ON 05/31/2016] apixaban  5 mg Oral BID   Followed by  . apixaban  10 mg Oral BID  . ARIPiprazole  20 mg Oral Daily  . aspirin  81 mg Oral Daily  . buPROPion  300 mg Oral BH-q7a  . carvedilol  12.5 mg Oral BID  . cholecalciferol  1,000 Units Oral Daily  . finasteride  5 mg Oral Daily  . magnesium sulfate 1 - 4 g bolus IVPB  4 g Intravenous BID  . magnesium sulfate 1 - 4 g bolus IVPB  4  g Intravenous Once  . mouth rinse  15 mL Mouth Rinse BID  . pantoprazole  40 mg Oral Daily  . PHENobarbital  129.6 mg Oral QODAY  . potassium chloride  40 mEq Oral Q4H  . pravastatin  40 mg Oral Daily  . protein supplement shake  2 oz Oral QID  . sodium chloride flush  3 mL Intravenous Q12H  . tamsulosin  0.8 mg Oral QPC supper  . triamcinolone cream   Topical BID  . vancomycin  125 mg Oral QID    Assessment: Patient admitted for acute renal failure that is now improving. US results show bilateral DVTs. Pharmacy consulted to initiate apixaban therapy. Patient was on Heparin 5000 units SQ q8h   Goal of Therapy:  resolution of symptoms Monitor platelets by anticoagulation protocol: Yes   Plan:  Apixaban 10mg  bid for 7 days followed by 5mg  bid was ordered.   Patient is on day 5 of apixaban 10 mg bid. Apixaban 5 mg bid is to start 10/23 Will monitor renal function. Hemoglobin trending down (12.9 -> 10.8 last night) so will order CBC for tomorrow am.  Horris LatinoHolly Gilliam, PharmD Pharmacy Resident 05/28/2016 8:35 AM

## 2016-05-28 NOTE — Progress Notes (Addendum)
05/28/2016 15:00  [name] to be D/C'd to SNF per MD order.  Discussed prescriptions and follow up appointments with the patient. Prescriptions given to patient, medication list explained in detail. Pt verbalized understanding.    Medication List    STOP taking these medications   furosemide 40 MG tablet Commonly known as:  LASIX   potassium chloride 10 MEQ tablet Commonly known as:  K-DUR,KLOR-CON   terazosin 5 MG capsule Commonly known as:  HYTRIN     TAKE these medications   acetaminophen 325 MG tablet Commonly known as:  TYLENOL Take 2 tablets (650 mg total) by mouth every 6 (six) hours as needed. What changed:  additional instructions   amLODipine 2.5 MG tablet Commonly known as:  NORVASC Take 1 tablet (2.5 mg total) by mouth daily.   apixaban 5 MG Tabs tablet Commonly known as:  ELIQUIS Take 2 tablets (10 mg total) by mouth 2 (two) times daily.   apixaban 5 MG Tabs tablet Commonly known as:  ELIQUIS Take 1 tablet (5 mg total) by mouth 2 (two) times daily. Start taking on:  05/31/2016   ARIPiprazole 20 MG tablet Commonly known as:  ABILIFY Take 1 tablet (20 mg total) by mouth daily. What changed:  when to take this   aspirin 81 MG chewable tablet Chew 81 mg by mouth daily.   buPROPion 300 MG 24 hr tablet Commonly known as:  BUDEPRION XL Take 1 tablet (300 mg total) by mouth daily. What changed:  when to take this   carvedilol 25 MG tablet Commonly known as:  COREG Take 1 tablet (25 mg total) by mouth 2 (two) times daily.   Cholecalciferol 1000 units tablet Commonly known as:  VITAMIN D-1000 MAX ST Take 1 tablet (1,000 Units total) by mouth daily.   finasteride 5 MG tablet Commonly known as:  PROSCAR Take 1 tablet (5 mg total) by mouth daily.   omeprazole 20 MG capsule Commonly known as:  PRILOSEC Take 2 capsules (40 mg total) by mouth daily.   PHENobarbital 64.8 MG tablet Commonly known as:  LUMINAL Take 2 tablets (129.6 mg total) by  mouth every other day.   polyethylene glycol packet Commonly known as:  MIRALAX / GLYCOLAX Take 17 g by mouth daily as needed for moderate constipation.   pravastatin 40 MG tablet Commonly known as:  PRAVACHOL Take 1 tablet (40 mg total) by mouth daily.   tamsulosin 0.4 MG Caps capsule Commonly known as:  FLOMAX Take 2 capsules (0.8 mg total) by mouth daily after supper.   traMADol 50 MG tablet Commonly known as:  ULTRAM Take 1 tablet (50 mg total) by mouth every 6 (six) hours as needed for moderate pain or severe pain.   vancomycin 50 mg/mL oral solution Commonly known as:  VANCOCIN Take 2.5 mLs (125 mg total) by mouth 4 (four) times daily.   VENTOLIN HFA 108 (90 Base) MCG/ACT inhaler Generic drug:  albuterol Inhale 2 puffs into the lungs every 6 (six) hours as needed for shortness of breath.       Vitals:   05/28/16 0800 05/28/16 1223  BP: 130/68 132/68  Pulse: 72 74  Resp: 16 16  Temp: 98.3 F (36.8 C) 98.3 F (36.8 C)    Skin clean, dry and intact without evidence of skin break down, no evidence of skin tears noted. IV catheter discontinued intact. Site without signs and symptoms of complications. Dressing and pressure applied. Pt denies pain at this time. No complaints noted.  An After Visit Summary was printed and given to the patient. Patient escorted and D/C via EMS.  Bradly Chris

## 2016-05-28 NOTE — Progress Notes (Signed)
Physical Therapy Treatment Patient Details Name: [name] MRN: [id number] DOB: 07-Jan-1941 Today's Date: 05/28/2016    History of Present Illness [name] is a 75yo male [facility] after sustaining a fall at his facility. CC: low back pain and BLE pain, noted to have AKI. Pt diagnosed with B LE DVT 05/24/16 and started on therapeutic dose of anti-coagulation meds same date at 12 p.m.  At baseline he resides at ALF, using a WC for primary mobility and intermittent ability to perform bed mobility/transfers at min-maxA variable performance. PMH includes bilpolar disorder.     PT Comments    Pt declining OOB activity at this time secondary to back and B LE pain (8/10; RN notified of pt request for pain meds). Pt agreeable to B UE and LE therapeutic exercises. Pt able to progress reps with min vc's to complete through full range as muscle began to fatigue. Pt pain in B LE at end of session 7/10. Pt will benefit from progressing functional mobility next session.    Follow Up Recommendations  SNF     Equipment Recommendations  Rolling walker with 5" wheels    Recommendations for Other Services       Precautions / Restrictions Precautions Precautions: Fall Restrictions Weight Bearing Restrictions: No    Mobility  Bed Mobility               General bed mobility comments: Pt refused all OOB activity; exercise in bed only this session  Transfers                    Ambulation/Gait                 Stairs            Wheelchair Mobility    Modified Rankin (Stroke Patients Only)       Balance                                    Cognition Arousal/Alertness: Awake/alert Behavior During Therapy: Flat affect Overall Cognitive Status: Within Functional Limits for tasks assessed                      Exercises General Exercises - Upper Extremity Shoulder Flexion: AROM;Strengthening;Both;10 reps;Supine  (demonstration; Bil muscle twitching with fatigue) Elbow Flexion: AROM;Strengthening;Both;10 reps;Supine (min vc's to not complete through full range on the R due to antecubital IV) Elbow Extension: AROM;Strengthening;Both;10 reps;Supine General Exercises - Lower Extremity Ankle Circles/Pumps: AROM;Strengthening;20 reps;Supine;Both Short Arc Quad: AROM;Strengthening;10 reps;Supine;Both (pt becoming fatigued with muscle twitching last few reps Billaterally) Heel Slides: AROM;Strengthening;Both;15 reps;Supine (min guard to prevent ankle rubbing in bed; min vc's to continue through full range in last 5 reps bilaterallly) Hip ABduction/ADduction: AROM;Strengthening;Both;10 reps;Supine (min guard to prevent ankle/calf rubbing in bed; min vc's to complete through full range last few reps)    General Comments General comments (skin integrity, edema, etc.): Pt declining all OOB activity however agreeable to therapeutic exercises in bed. Pt educated that ankle pumps and SAQ (with RN) can be performed to improve LE strength.       Pertinent Vitals/Pain Pain Assessment: 0-10 Pain Score: 8  (8/10 at beginning of session; 7/10 at end of session) Pain Location: Back and B LE's Pain Descriptors / Indicators: Aching Pain Intervention(s): Limited activity within patient's tolerance;Monitored during session;Patient requesting pain meds-RN notified;Repositioned  Pt 02  on room air 98% throughout session. Pt HR 75-76 bpm throughout session.     Home Living                      Prior Function            PT Goals (current goals can now be found in the care plan section) Acute Rehab PT Goals PT Goal Formulation: With patient Progress towards PT goals: Not progressing toward goals - comment (Pt refusing OOB activity at this time; progressing therapeutic exercises)    Frequency    Min 2X/week      PT Plan Current plan remains appropriate    Co-evaluation             End of Session  Equipment Utilized During Treatment: Gait belt Activity Tolerance: Patient tolerated treatment well Patient left: in bed;with call bell/phone within reach;with bed alarm set (B heels suspended with pillows)     Time: 8119-1478 PT Time Calculation (min) (ACUTE ONLY): 27 min  Charges:                       G CodesAlbertina Senegal, SPT 05/28/2016, 9:35 AM

## 2016-05-28 NOTE — Clinical Social Work Placement (Signed)
   CLINICAL SOCIAL WORK PLACEMENT  NOTE  Date:  05/28/2016  Patient Details  Name: [name] DarbyJerry L Cena MRN: [id number] Date of Birth: March 10, 1941  Clinical Social Work is seeking post-discharge placement for this patient at the Skilled  Nursing Facility level of care (*CSW will initial, date and re-position this form in  chart as items are completed):  Yes   Patient/family provided with [place] Clinical Social Work Department's list of facilities offering this level of care within the geographic area requested by the patient (or if unable, by the patient's family).  Yes   Patient/family informed of their freedom to choose among providers that offer the needed level of care, that participate in Medicare, Medicaid or managed care program needed by the patient, have an available bed and are willing to accept the patient.  Yes   Patient/family informed of [facility]'s ownership interest [facility] and [facility], as well as of the fact that they are under no obligation to receive care at these facilities.  PASRR submitted to EDS on 05/24/16     PASRR number received on 05/25/16     Existing PASRR number confirmed on       FL2 transmitted to all facilities in geographic area requested by pt/family on 05/28/16     FL2 transmitted to all facilities within larger geographic area on       Patient informed that his/her managed care company has contracts with or will negotiate with certain facilities, including the following:        Yes   Patient/family informed of bed offers received.  [facility]     Physician recommends and patient chooses bed at  [facility](SNF)    [facility] 05/28/16.  Patient to be transferred to facility by  (EMS)     Patient family notified on 05/28/16 of transfer.  Name of family member notified:  Patient with no next of kin and is his own guardian.     PHYSICIAN        Additional Comment:    _______________________________________________ York SpanielMonica Brecken Dewoody, LCSW 05/28/2016, 10:53 AM

## 2016-05-29 LAB — STOOL CULTURE: E COLI SHIGA TOXIN ASSAY: NEGATIVE

## 2016-05-29 LAB — STOOL CULTURE REFLEX - CMPCXR

## 2016-05-29 LAB — STOOL CULTURE REFLEX - RSASHR

## 2016-06-16 ENCOUNTER — Encounter: Payer: Self-pay | Admitting: Urology

## 2016-06-16 ENCOUNTER — Ambulatory Visit (INDEPENDENT_AMBULATORY_CARE_PROVIDER_SITE_OTHER): Payer: Medicare Other | Admitting: Urology

## 2016-06-16 ENCOUNTER — Telehealth: Payer: Self-pay

## 2016-06-16 VITALS — BP 130/85 | HR 80 | Ht 70.0 in | Wt 271.8 lb

## 2016-06-16 DIAGNOSIS — N138 Other obstructive and reflux uropathy: Secondary | ICD-10-CM

## 2016-06-16 DIAGNOSIS — N401 Enlarged prostate with lower urinary tract symptoms: Secondary | ICD-10-CM

## 2016-06-16 DIAGNOSIS — R338 Other retention of urine: Secondary | ICD-10-CM

## 2016-06-16 MED ORDER — TAMSULOSIN HCL 0.4 MG PO CAPS: 0.4000 mg | ORAL_CAPSULE | Freq: Every day | ORAL | 3 refills | Status: DC

## 2016-06-16 MED ORDER — FINASTERIDE 5 MG PO TABS: 5.0000 mg | ORAL_TABLET | Freq: Every day | ORAL | 3 refills | Status: DC

## 2016-06-16 NOTE — Telephone Encounter (Signed)
Tamsulosin 0.4 mg twice daily is the maximum dose.  There really is no benefit in taking 0.8 bid.

## 2016-06-16 NOTE — Progress Notes (Signed)
06/16/2016 3:42 PM   [name] [birth year] [id number]  Referring provider: Lorenda Ishiharaupashree Varadarajan, MD [address] [place], [place] [address]  Chief Complaint  Patient presents with  . New Patient (Initial Visit)    Foley Removal referred by ER    HPI: Patient is a 75 year old Caucasian male who presents today at the request of [facility]'s ED for Foley catheter removal.     Patient is a poor historian and the woman with him today, Diane, is just involved with transporting the patient and is not involved with the patients clinically.    Reviewing hospital records, he was having difficulty with voiding in 04/2016 and finasteride was added at that time.  He was on terazosin at the time of admission.  He was also complaining of constipation and was started on MiraLax.   A Foley was placed on 05/21/2016 and 3 liters of urine was drained from the bladder.  He was changed to tamsulosin and had terazosin discontinued.  Finasteride is continued as well.  He was also found to be C. Diff positive.    Patient was admitted to the hospital for UTI, sepsis, ARF and DVT.  He was discharged with the Foley.    He is unable to fill I PSS score or comment regarding his urination ability prior to the Foley catheter placement.   PMH: Past Medical History:  Diagnosis Date  . Bipolar 1 disorder (HCC)   . CHF (congestive heart failure) (HCC)   . COPD (chronic obstructive pulmonary disease) (HCC)   . DVT (deep venous thrombosis) (HCC)   . GERD (gastroesophageal reflux disease)   . High cholesterol   . Hypertension   . Schizophrenia (HCC)   . Seizure [facility](HCC)     Surgical History: Past Surgical History:  Procedure Laterality Date  . NO PAST SURGERIES      Home Medications:    Medication List       Accurate as of 06/16/16 11:59 PM. Always use your most recent med list.          acetaminophen 325 MG tablet Commonly known as:  TYLENOL Take 2 tablets (650 mg total) by mouth  every 6 (six) hours as needed.   Acidophilus Tabs Take 1 tablet by mouth 2 (two) times daily.   amLODipine 2.5 MG tablet Commonly known as:  NORVASC Take 1 tablet (2.5 mg total) by mouth daily.   apixaban 5 MG Tabs tablet Commonly known as:  ELIQUIS Take 2 tablets (10 mg total) by mouth 2 (two) times daily.   apixaban 5 MG Tabs tablet Commonly known as:  ELIQUIS Take 1 tablet (5 mg total) by mouth 2 (two) times daily.   ARIPiprazole 20 MG tablet Commonly known as:  ABILIFY Take 1 tablet (20 mg total) by mouth daily.   aspirin 81 MG chewable tablet Chew 81 mg by mouth daily.   buPROPion 300 MG 24 hr tablet Commonly known as:  BUDEPRION XL Take 1 tablet (300 mg total) by mouth daily.   carvedilol 25 MG tablet Commonly known as:  COREG Take 1 tablet (25 mg total) by mouth 2 (two) times daily.   Cholecalciferol 1000 units tablet Commonly known as:  VITAMIN D-1000 MAX ST Take 1 tablet (1,000 Units total) by mouth daily.   doxycycline 100 MG capsule Commonly known as:  VIBRAMYCIN Take 100 mg by mouth 2 (two) times daily.   finasteride 5 MG tablet Commonly known as:  PROSCAR Take 1  tablet (5 mg total) by mouth daily.   finasteride 5 MG tablet Commonly known as:  PROSCAR Take 1 tablet (5 mg total) by mouth daily.   omeprazole 20 MG capsule Commonly known as:  PRILOSEC Take 2 capsules (40 mg total) by mouth daily.   PHENobarbital 64.8 MG tablet Commonly known as:  LUMINAL Take 2 tablets (129.6 mg total) by mouth every other day.   polyethylene glycol packet Commonly known as:  MIRALAX / GLYCOLAX Take 17 g by mouth daily as needed for moderate constipation.   pravastatin 40 MG tablet Commonly known as:  PRAVACHOL Take 1 tablet (40 mg total) by mouth daily.   tamsulosin 0.4 MG Caps capsule Commonly known as:  FLOMAX Take 2 capsules (0.8 mg total) by mouth daily after supper.   tamsulosin 0.4 MG Caps capsule Commonly known as:  FLOMAX Take 1 capsule (0.4 mg  total) by mouth daily.   traMADol 50 MG tablet Commonly known as:  ULTRAM Take 1 tablet (50 mg total) by mouth every 6 (six) hours as needed for moderate pain or severe pain.   vancomycin 50 mg/mL oral solution Commonly known as:  VANCOCIN Take 2.5 mLs (125 mg total) by mouth 4 (four) times daily.   VENTOLIN HFA 108 (90 Base) MCG/ACT inhaler Generic drug:  albuterol Inhale 2 puffs into the lungs every 6 (six) hours as needed for shortness of breath.       Allergies:  Allergies  Allergen Reactions  . Penicillin G Other (See Comments)    Convulsions. Has patient had a PCN reaction causing immediate rash, facial/tongue/throat swelling, SOB or lightheadedness with hypotension: not sure Has patient had a PCN reaction causing severe rash involving mucus membranes or skin necrosis: not sure Has patient had a PCN reaction that required hospitalization: not sure Has patient had a PCN reaction occurring within the last 10 years: not sure If all of the above answers are "NO", then may proceed with Cephalosporin use.    Family History: Family History  Problem Relation Age of Onset  . Healthy Mother   . Prostate cancer Neg Hx   . Kidney disease Neg Hx     Social History:  reports that he has never smoked. He has never used smokeless tobacco. He reports that he does not drink alcohol or use drugs.  ROS: UROLOGY Frequent Urination?: No Hard to postpone urination?: No Burning/pain with urination?: No Get up at night to urinate?: No Leakage of urine?: No Urine stream starts and stops?: No Trouble starting stream?: No Do you have to strain to urinate?: No Blood in urine?: No Urinary tract infection?: Yes Sexually transmitted disease?: No Injury to kidneys or bladder?: No Painful intercourse?: No Weak stream?: No Erection problems?: No Penile pain?: No  Gastrointestinal Nausea?: No Vomiting?: No Indigestion/heartburn?: No Diarrhea?: No Constipation?:  No  Constitutional Fever: No Night sweats?: No Weight loss?: No Fatigue?: No  Skin Skin rash/lesions?: No Itching?: Yes  Eyes Blurred vision?: No Double vision?: No  Ears/Nose/Throat Sore throat?: No Sinus problems?: No  Hematologic/Lymphatic Swollen glands?: No Easy bruising?: No  Cardiovascular Leg swelling?: No Chest pain?: No  Respiratory Cough?: No Shortness of breath?: No  Endocrine Excessive thirst?: No  Musculoskeletal Back pain?: No Joint pain?: No  Neurological Headaches?: No Dizziness?: No  Psychologic Depression?: No Anxiety?: No  Physical Exam: BP 130/85   Pulse 80   Ht 5\' 10"  (1.778 m)   Wt 271 lb 12.8 oz (123.3 kg)   BMI 39.00 kg/m  Constitutional: Well nourished. Alert. No acute distress.  Smells of vomit.  Vomit seen in wheel chair when patient stood for exam.   HEENT: [place] AT, moist mucus membranes. Trachea midline, no masses. Cardiovascular: No clubbing, cyanosis, or edema. Respiratory: Normal respiratory effort, no increased work of breathing. GI: Abdomen is soft, non tender, non distended, no abdominal masses. Liver and spleen not palpable.  No hernias appreciated.  Stool sample for occult testing is not indicated.   GU: No CVA tenderness.  No bladder fullness or masses.  Patient with buried phallus.  Urethral meatus is patent.  No penile discharge. No penile lesions or rashes. Scrotum without lesions, cysts, rashes and/or edema.  Testicles are located scrotally bilaterally. No masses are appreciated in the testicles. Left and right epididymis are normal. Rectal: Patient with  normal sphincter tone. Anus and perineum without scarring or rashes. No rectal masses are appreciated. Prostate is approximately 50 grams, DRE limited to patient's body habitus, no nodules are appreciated. Seminal vesicles are normal. Skin: No rashes, bruises or suspicious lesions. Lymph: No cervical or inguinal adenopathy. Neurologic: Grossly intact, no focal  deficits, moving all 4 extremities. Psychiatric: Normal mood and affect.  Laboratory Data: Lab Results  Component Value Date   WBC 9.5 05/27/2016   HGB 10.8 (L) 05/27/2016   HCT 31.6 (L) 05/27/2016   MCV 85.7 05/27/2016   PLT 210 05/27/2016    Lab Results  Component Value Date   CREATININE 1.16 05/27/2016    Lab Results  Component Value Date   TSH 1.772 04/23/2016     Lab Results  Component Value Date   AST 19 05/21/2016   Lab Results  Component Value Date   ALT 8 (L) 05/21/2016     Pertinent Imaging: CLINICAL DATA:  Central abdominal pain for 1 week. Productive cough since Sunday. Rolled out of bed on Sunday. Bruising of the right side of the face.  EXAM: CT ABDOMEN AND PELVIS WITHOUT CONTRAST  TECHNIQUE: Multidetector CT imaging of the abdomen and pelvis was performed following the standard protocol without IV contrast.  COMPARISON:  CT of the abdomen and pelvis on 10/02/2012  FINDINGS: Lower chest: There is dense opacity in the medial right lower lobe, somewhat rounded and masslike in appearance, measuring 4.6 centimeters but only partially imaged. Minimal atelectasis identified in the left lower lobe.  Upper abdomen: No focal abnormality identified within the liver, spleen, pancreas, or adrenal glands. A 4.5 centimeter upper pole cyst is identified in the right kidney. There is no hydronephrosis. Intrarenal calculi are identified in the right kidney. No ureteral obstruction. Largest calculi in the lower pole region on the right measure 1.3 centimeters.  Gastrointestinal tract: The stomach, small bowel loops have a normal appearance. The appendix is well seen and has a normal appearance. Colonic loops are normal in appearance.  Pelvis: Urinary bladder, prostate gland, and seminal vesicles have a normal appearance. No free pelvic fluid.  Retroperitoneum: No retroperitoneal or mesenteric adenopathy. There is atherosclerosis of the  abdominal aorta. No aneurysm.  Abdominal wall: Unremarkable.  Osseous structures: Significant degenerative changes in the lower thoracic and lumbar spine. No suspicious lytic or blastic lesions are identified.  IMPRESSION: 1. Dense opacity in the medial aspect of the right lower lobe, possibly representing infectious process. However given the rounded appearance a mass is not excluded and follow-up is recommended. Given location of this opacity it is difficult to image radiographically and follow-up CT of the chest is recommended in 3-4 weeks following trial of  antibiotic therapy to ensure resolution and exclude underlying malignancy. 2. Atherosclerosis of the abdominal aorta. 3. 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   Electronically Signed   By: Norva PavlovElizabeth  Brown M.D.   On: 02/21/2015 13:15  Assessment & Plan:    1. Acute urinary retention:     - foley catheter removed  -voiding trial today    -return if unable to urinate or experiencing suprapubic discomfort  -follow-up in one month for I PSS score, PVR and exam.  2. BPH with LUTS  - Continue conservative management, avoiding bladder irritants and timed voiding's  - Continue tamsulosin 0.4 mg daily and finasteride 5 mg daily; refills given  - RTC in one month for IPSS, PVR and exam    Return in about 1 month (around 07/16/2016) for IPSS and exam.  These notes generated with voice recognition software. I apologize for typographical errors.  Michiel CowboySHANNON Chelsey Redondo, PA-C  [facility] [address], [address] [place], [place] [address] [phone]

## 2016-06-16 NOTE — Progress Notes (Signed)
Catheter Removal  Patient is present today for a catheter removal.  7ml of water was drained from the balloon. A 16FR foley cath was removed from the bladder no complications were noted . Patient tolerated well.  Preformed by: Dallas Schimkeamona Williams CMA  Follow up/ Additional notes: drink plenty of fluids and if patient can not urinate on his own by 3:00pm come back to office.

## 2016-06-16 NOTE — Telephone Encounter (Signed)
Debbie, pt caregiver at facility, stated she received a fax from their pharmacy stating pt should be 0.4mg  bid of flomax. Per Eunice Blaseebbie pt has been taking 0.8mg  bid daily. Please advise which dosage pt should be taking.

## 2016-06-21 NOTE — Telephone Encounter (Signed)
Spoke with Reggie, pt nurse, in reference to dosage of tamsulosin. Reggie voiced understanding.

## 2016-07-15 ENCOUNTER — Ambulatory Visit: Payer: Medicare Other | Admitting: Urology

## 2016-07-29 ENCOUNTER — Ambulatory Visit (INDEPENDENT_AMBULATORY_CARE_PROVIDER_SITE_OTHER): Payer: Medicare Other | Admitting: Urology

## 2016-07-29 ENCOUNTER — Encounter: Payer: Self-pay | Admitting: Urology

## 2016-07-29 VITALS — BP 122/67 | HR 83 | Ht 70.0 in

## 2016-07-29 DIAGNOSIS — R339 Retention of urine, unspecified: Secondary | ICD-10-CM

## 2016-07-29 DIAGNOSIS — N401 Enlarged prostate with lower urinary tract symptoms: Secondary | ICD-10-CM

## 2016-07-29 DIAGNOSIS — N138 Other obstructive and reflux uropathy: Secondary | ICD-10-CM | POA: Diagnosis not present

## 2016-07-29 NOTE — Progress Notes (Signed)
07/29/2016 3:14 PM   [name] 19-Jun-1941 [id number]  Referring provider: Lorenda Ishiharaupashree Varadarajan, MD [address] [place], [place] [address]  Chief Complaint  Patient presents with  . Urinary Retention    HPI: Patient is a 75 year old Caucasian male who presents today for a one month follow up for urinary retention.    Background history Patient presented to us at the request of [facility]'s ED for Foley catheter removal.   Patient is a poor historian and the woman with him today, Diane, is just involved with transporting the patient and is not involved with the patients clinically.  Reviewing hospital records, he was having difficulty with voiding in 04/2016 and finasteride was added at that time.  He was on terazosin at the time of admission.  He was also complaining of constipation and was started on MiraLax.   A Foley was placed on 05/21/2016 and 3 liters of urine was drained from the bladder.  He was changed to tamsulosin and had terazosin discontinued.  Finasteride is continued as well.  He was also found to be C. Diff positive.  Patient was admitted to the hospital for UTI, sepsis, ARF and DVT.  He was discharged with the Foley.     He is unable to fill I PSS score or comment regarding his urination ability prior to the Foley catheter placement.  On 06/16/2016, his Foley was removed at that visit for a voiding trial.  He presented back to the office that afternoon in retention and the Foley was replaced.  The home health care team exchanged the Foley on 07/28/2016.     PMH: Past Medical History:  Diagnosis Date  . Bipolar 1 disorder (HCC)   . CHF (congestive heart failure) (HCC)   . COPD (chronic obstructive pulmonary disease) (HCC)   . DVT (deep venous thrombosis) (HCC)   . GERD (gastroesophageal reflux disease)   . High cholesterol   . Hypertension   . Schizophrenia (HCC)   . Seizure [facility](HCC)     Surgical History: Past Surgical History:  Procedure  Laterality Date  . NO PAST SURGERIES      Home Medications:  Allergies as of 07/29/2016      Reactions   Penicillin G Other (See Comments)   Convulsions. Has patient had a PCN reaction causing immediate rash, facial/tongue/throat swelling, SOB or lightheadedness with hypotension: not sure Has patient had a PCN reaction causing severe rash involving mucus membranes or skin necrosis: not sure Has patient had a PCN reaction that required hospitalization: not sure Has patient had a PCN reaction occurring within the last 10 years: not sure If all of the above answers are "NO", then may proceed with Cephalosporin use.      Medication List       Accurate as of 07/29/16  3:14 PM. Always use your most recent med list.          acetaminophen 325 MG tablet Commonly known as:  TYLENOL Take 2 tablets (650 mg total) by mouth every 6 (six) hours as needed.   Acidophilus Tabs Take 1 tablet by mouth 2 (two) times daily.   amLODipine 2.5 MG tablet Commonly known as:  NORVASC Take 1 tablet (2.5 mg total) by mouth daily.   apixaban 5 MG Tabs tablet Commonly known as:  ELIQUIS Take 2 tablets (10 mg total) by mouth 2 (two) times daily.   apixaban 5 MG Tabs tablet Commonly known as:  ELIQUIS Take 1 tablet (  5 mg total) by mouth 2 (two) times daily.   ARIPiprazole 20 MG tablet Commonly known as:  ABILIFY Take 1 tablet (20 mg total) by mouth daily.   aspirin 81 MG chewable tablet Chew 81 mg by mouth daily.   buPROPion 300 MG 24 hr tablet Commonly known as:  BUDEPRION XL Take 1 tablet (300 mg total) by mouth daily.   carvedilol 25 MG tablet Commonly known as:  COREG Take 1 tablet (25 mg total) by mouth 2 (two) times daily.   cephALEXin 250 MG capsule Commonly known as:  KEFLEX Take by mouth 4 (four) times daily.   Cholecalciferol 1000 units tablet Commonly known as:  VITAMIN D-1000 MAX ST Take 1 tablet (1,000 Units total) by mouth daily.   doxycycline 100 MG capsule Commonly  known as:  VIBRAMYCIN Take 100 mg by mouth 2 (two) times daily.   finasteride 5 MG tablet Commonly known as:  PROSCAR Take 1 tablet (5 mg total) by mouth daily.   finasteride 5 MG tablet Commonly known as:  PROSCAR Take 1 tablet (5 mg total) by mouth daily.   furosemide 80 MG tablet Commonly known as:  LASIX Take 80 mg by mouth.   omeprazole 20 MG capsule Commonly known as:  PRILOSEC Take 2 capsules (40 mg total) by mouth daily.   PHENobarbital 64.8 MG tablet Commonly known as:  LUMINAL Take 2 tablets (129.6 mg total) by mouth every other day.   polyethylene glycol packet Commonly known as:  MIRALAX / GLYCOLAX Take 17 g by mouth daily as needed for moderate constipation.   potassium chloride 20 MEQ packet Commonly known as:  KLOR-CON Take by mouth 2 (two) times daily.   pravastatin 40 MG tablet Commonly known as:  PRAVACHOL Take 1 tablet (40 mg total) by mouth daily.   tamsulosin 0.4 MG Caps capsule Commonly known as:  FLOMAX Take 2 capsules (0.8 mg total) by mouth daily after supper.   tamsulosin 0.4 MG Caps capsule Commonly known as:  FLOMAX Take 1 capsule (0.4 mg total) by mouth daily.   traMADol 50 MG tablet Commonly known as:  ULTRAM Take 1 tablet (50 mg total) by mouth every 6 (six) hours as needed for moderate pain or severe pain.   vancomycin 50 mg/mL oral solution Commonly known as:  VANCOCIN Take 2.5 mLs (125 mg total) by mouth 4 (four) times daily.   VENTOLIN HFA 108 (90 Base) MCG/ACT inhaler Generic drug:  albuterol Inhale 2 puffs into the lungs every 6 (six) hours as needed for shortness of breath.       Allergies:  Allergies  Allergen Reactions  . Penicillin G Other (See Comments)    Convulsions. Has patient had a PCN reaction causing immediate rash, facial/tongue/throat swelling, SOB or lightheadedness with hypotension: not sure Has patient had a PCN reaction causing severe rash involving mucus membranes or skin necrosis: not sure Has  patient had a PCN reaction that required hospitalization: not sure Has patient had a PCN reaction occurring within the last 10 years: not sure If all of the above answers are "NO", then may proceed with Cephalosporin use.    Family History: Family History  Problem Relation Age of Onset  . Healthy Mother   . Prostate cancer Neg Hx   . Kidney disease Neg Hx     Social History:  reports that he has never smoked. He has never used smokeless tobacco. He reports that he does not drink alcohol or use drugs.  ROS: UROLOGY  Frequent Urination?: No Hard to postpone urination?: No Burning/pain with urination?: No Get up at night to urinate?: No Leakage of urine?: No Urine stream starts and stops?: No Trouble starting stream?: No Do you have to strain to urinate?: No Blood in urine?: No Urinary tract infection?: No Sexually transmitted disease?: No Injury to kidneys or bladder?: No Painful intercourse?: No Weak stream?: No Erection problems?: No Penile pain?: No  Gastrointestinal Nausea?: No Vomiting?: No Indigestion/heartburn?: No Diarrhea?: No Constipation?: No  Constitutional Fever: No Night sweats?: No Weight loss?: No Fatigue?: No  Skin Skin rash/lesions?: No Itching?: No  Eyes Blurred vision?: No Double vision?: No  Ears/Nose/Throat Sore throat?: No Sinus problems?: No  Hematologic/Lymphatic Swollen glands?: No Easy bruising?: No  Cardiovascular Leg swelling?: Yes Chest pain?: No  Respiratory Cough?: No Shortness of breath?: No  Endocrine Excessive thirst?: No  Musculoskeletal Back pain?: No Joint pain?: No  Neurological Headaches?: No Dizziness?: No  Psychologic Depression?: No Anxiety?: No  Physical Exam: BP 122/67 (BP Location: Right Arm, Patient Position: Sitting, Cuff Size: Large)   Pulse 83   Ht 5\' 10"  (1.778 m)   Constitutional: Well nourished. Alert. No acute distress.  Smells of vomit.  Vomit seen in wheel chair when patient  stood for exam.   HEENT: [place] AT, moist mucus membranes. Trachea midline, no masses. Cardiovascular: No clubbing, cyanosis, or edema. Respiratory: Normal respiratory effort, no increased work of breathing. GI: Abdomen is soft, non tender, non distended, no abdominal masses. Liver and spleen not palpable.  No hernias appreciated.  Stool sample for occult testing is not indicated.   Skin: No rashes, bruises or suspicious lesions. Lymph: No cervical or inguinal adenopathy.  Severe pedal edema with weeping sores. Neurologic: Grossly intact, no focal deficits, moving all 4 extremities. Psychiatric: Normal mood and affect.  Laboratory Data: Lab Results  Component Value Date   WBC 9.5 05/27/2016   HGB 10.8 (L) 05/27/2016   HCT 31.6 (L) 05/27/2016   MCV 85.7 05/27/2016   PLT 210 05/27/2016    Lab Results  Component Value Date   CREATININE 1.16 05/27/2016    Lab Results  Component Value Date   TSH 1.772 04/23/2016     Lab Results  Component Value Date   AST 19 05/21/2016   Lab Results  Component Value Date   ALT 8 (L) 05/21/2016     Pertinent Imaging: CLINICAL DATA:  Central abdominal pain for 1 week. Productive cough since Sunday. Rolled out of bed on Sunday. Bruising of the right side of the face.  EXAM: CT ABDOMEN AND PELVIS WITHOUT CONTRAST  TECHNIQUE: Multidetector CT imaging of the abdomen and pelvis was performed following the standard protocol without IV contrast.  COMPARISON:  CT of the abdomen and pelvis on 10/02/2012  FINDINGS: Lower chest: There is dense opacity in the medial right lower lobe, somewhat rounded and masslike in appearance, measuring 4.6 centimeters but only partially imaged. Minimal atelectasis identified in the left lower lobe.  Upper abdomen: No focal abnormality identified within the liver, spleen, pancreas, or adrenal glands. A 4.5 centimeter upper pole cyst is identified in the right kidney. There is no  hydronephrosis. Intrarenal calculi are identified in the right kidney. No ureteral obstruction. Largest calculi in the lower pole region on the right measure 1.3 centimeters.  Gastrointestinal tract: The stomach, small bowel loops have a normal appearance. The appendix is well seen and has a normal appearance. Colonic loops are normal in appearance.  Pelvis: Urinary bladder, prostate gland,  and seminal vesicles have a normal appearance. No free pelvic fluid.  Retroperitoneum: No retroperitoneal or mesenteric adenopathy. There is atherosclerosis of the abdominal aorta. No aneurysm.  Abdominal wall: Unremarkable.  Osseous structures: Significant degenerative changes in the lower thoracic and lumbar spine. No suspicious lytic or blastic lesions are identified.  IMPRESSION: 1. Dense opacity in the medial aspect of the right lower lobe, possibly representing infectious process. However given the rounded appearance a mass is not excluded and follow-up is recommended. Given location of this opacity it is difficult to image radiographically and follow-up CT of the chest is recommended in 3-4 weeks following trial of antibiotic therapy to ensure resolution and exclude underlying malignancy. 2. Atherosclerosis of the abdominal aorta. 3. 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   Electronically Signed   By: Norva Pavlov M.D.   On: 02/21/2015 13:15  Assessment & Plan:    1. Urinary retention:     - Patient has failed a voiding trial  - will continue with the indwelling Foley at this time as patient is undergoing diuresis for severe pedal edema  - continue monthly exchanges  - will reassess in 09/2016 for another possible attempt at a voiding trial  2. BPH with LUTS  - Continue conservative management, avoiding bladder irritants and timed voiding's  - Continue tamsulosin 0.4 mg daily and finasteride 5 mg daily  - RTC in February 2018 for IPSS, PVR and exam     Return for February for a possible voiding trial; morning appointment.  These notes generated with voice recognition software. I apologize for typographical errors.  Michiel Cowboy, PA-C  [facility] [address] [place], [place] [address] [phone]

## 2016-08-09 DIAGNOSIS — N4889 Other specified disorders of penis: Secondary | ICD-10-CM

## 2016-08-09 HISTORY — DX: Other specified disorders of penis: N48.89

## 2016-09-12 NOTE — Progress Notes (Signed)
09/13/2016 9:04 AM   [name] 01/24/1941 [id number]  Referring provider: Lorenda Ishihara, MD [address] [place], [place] [address]  Chief Complaint  Patient presents with  . Benign Prostatic Hypertrophy    possible void and trial  . Urinary Retention    HPI: Patient is a 76 year old Caucasian male who presents today for a one month follow up for urinary retention.    Background history Patient presented to [place] at the request of [facility] ED for Foley catheter removal.   Patient is a poor historian and the woman with him today, Diane, is just involved with transporting the patient and is not involved with the patients clinically.  Reviewing hospital records, he was having difficulty with voiding in 04/2016 and finasteride was added at that time.  He was on terazosin at the time of admission.  He was also complaining of constipation and was started on MiraLax.   A Foley was placed on 05/21/2016 and 3 liters of urine was drained from the bladder.  He was changed to tamsulosin and had terazosin discontinued.  Finasteride is continued as well.  He was also found to be C. Diff positive.  Patient was admitted to the hospital for UTI, sepsis, ARF and DVT.  He was discharged with the Foley.   He is unable to fill I PSS score or comment regarding his urination ability prior to the Foley catheter placement.  On 06/16/2016, his Foley was removed at that visit for a voiding trial.  He presented back to the office that afternoon in retention and the Foley was replaced.  The home health care team exchanged the Foley on 07/28/2016.    Today, he is wanting to have another voiding trial.  He is continuing to take the tamsulosin and finasteride.  He has not had gross hematuria or Foley discomfort.  He has not had an infection since he was last seen in with [place].      PMH significant for CHF, COPD, DVT, seizure disorder, schizophrenia and Bipolar 1 disorder.    PMH: Past Medical  History:  Diagnosis Date  . Bipolar 1 disorder (HCC)   . CHF (congestive heart failure) (HCC)   . COPD (chronic obstructive pulmonary disease) (HCC)   . DVT (deep venous thrombosis) (HCC)   . GERD (gastroesophageal reflux disease)   . High cholesterol   . Hypertension   . Schizophrenia (HCC)   . Seizure [facility])     Surgical History: Past Surgical History:  Procedure Laterality Date  . NO PAST SURGERIES      Home Medications:  Allergies as of 09/13/2016      Reactions   Penicillin G Other (See Comments)   Convulsions. Has patient had a PCN reaction causing immediate rash, facial/tongue/throat swelling, SOB or lightheadedness with hypotension: not sure Has patient had a PCN reaction causing severe rash involving mucus membranes or skin necrosis: not sure Has patient had a PCN reaction that required hospitalization: not sure Has patient had a PCN reaction occurring within the last 10 years: not sure If all of the above answers are "NO", then may proceed with Cephalosporin use.      Medication List       Accurate as of 09/13/16  9:04 AM. Always use your most recent med list.          acetaminophen 325 MG tablet Commonly known as:  TYLENOL Take 2 tablets (650 mg total) by mouth every 6 (six) hours  as needed.   Acidophilus Tabs Take 1 tablet by mouth 2 (two) times daily.   amLODipine 2.5 MG tablet Commonly known as:  NORVASC Take 1 tablet (2.5 mg total) by mouth daily.   apixaban 5 MG Tabs tablet Commonly known as:  ELIQUIS Take 2 tablets (10 mg total) by mouth 2 (two) times daily.   apixaban 5 MG Tabs tablet Commonly known as:  ELIQUIS Take 1 tablet (5 mg total) by mouth 2 (two) times daily.   ARIPiprazole 20 MG tablet Commonly known as:  ABILIFY Take 1 tablet (20 mg total) by mouth daily.   aspirin 81 MG chewable tablet Chew 81 mg by mouth daily.   buPROPion 300 MG 24 hr tablet Commonly known as:  BUDEPRION XL Take 1 tablet (300 mg total) by mouth daily.     carvedilol 25 MG tablet Commonly known as:  COREG Take 1 tablet (25 mg total) by mouth 2 (two) times daily.   cephALEXin 250 MG capsule Commonly known as:  KEFLEX Take by mouth 4 (four) times daily.   Cholecalciferol 1000 units tablet Commonly known as:  VITAMIN D-1000 MAX ST Take 1 tablet (1,000 Units total) by mouth daily.   doxycycline 100 MG capsule Commonly known as:  VIBRAMYCIN Take 100 mg by mouth 2 (two) times daily.   finasteride 5 MG tablet Commonly known as:  PROSCAR Take 1 tablet (5 mg total) by mouth daily.   finasteride 5 MG tablet Commonly known as:  PROSCAR Take 1 tablet (5 mg total) by mouth daily.   furosemide 80 MG tablet Commonly known as:  LASIX Take 80 mg by mouth.   omeprazole 20 MG capsule Commonly known as:  PRILOSEC Take 2 capsules (40 mg total) by mouth daily.   PHENobarbital 64.8 MG tablet Commonly known as:  LUMINAL Take 2 tablets (129.6 mg total) by mouth every other day.   polyethylene glycol packet Commonly known as:  MIRALAX / GLYCOLAX Take 17 g by mouth daily as needed for moderate constipation.   potassium chloride 20 MEQ packet Commonly known as:  KLOR-CON Take by mouth 2 (two) times daily.   pravastatin 40 MG tablet Commonly known as:  PRAVACHOL Take 1 tablet (40 mg total) by mouth daily.   tamsulosin 0.4 MG Caps capsule Commonly known as:  FLOMAX Take 2 capsules (0.8 mg total) by mouth daily after supper.   tamsulosin 0.4 MG Caps capsule Commonly known as:  FLOMAX Take 1 capsule (0.4 mg total) by mouth daily.   traMADol 50 MG tablet Commonly known as:  ULTRAM Take 1 tablet (50 mg total) by mouth every 6 (six) hours as needed for moderate pain or severe pain.   vancomycin 50 mg/mL oral solution Commonly known as:  VANCOCIN Take 2.5 mLs (125 mg total) by mouth 4 (four) times daily.   VENTOLIN HFA 108 (90 Base) MCG/ACT inhaler Generic drug:  albuterol Inhale 2 puffs into the lungs every 6 (six) hours as needed for  shortness of breath.       Allergies:  Allergies  Allergen Reactions  . Penicillin G Other (See Comments)    Convulsions. Has patient had a PCN reaction causing immediate rash, facial/tongue/throat swelling, SOB or lightheadedness with hypotension: not sure Has patient had a PCN reaction causing severe rash involving mucus membranes or skin necrosis: not sure Has patient had a PCN reaction that required hospitalization: not sure Has patient had a PCN reaction occurring within the last 10 years: not sure If all of  the above answers are "NO", then may proceed with Cephalosporin use.    Family History: Family History  Problem Relation Age of Onset  . Healthy Mother   . Prostate cancer Neg Hx   . Kidney disease Neg Hx   . Kidney cancer Neg Hx   . Bladder Cancer Neg Hx     Social History:  reports that he has never smoked. He has never used smokeless tobacco. He reports that he does not drink alcohol or use drugs.  ROS: UROLOGY Frequent Urination?: No Hard to postpone urination?: No Burning/pain with urination?: No Get up at night to urinate?: No Leakage of urine?: No Urine stream starts and stops?: No Trouble starting stream?: No Do you have to strain to urinate?: No Blood in urine?: No Urinary tract infection?: No Sexually transmitted disease?: No Injury to kidneys or bladder?: No Painful intercourse?: No Weak stream?: No Erection problems?: No Penile pain?: No  Gastrointestinal Nausea?: No Vomiting?: No Indigestion/heartburn?: No Diarrhea?: No Constipation?: No  Constitutional Fever: No Night sweats?: No Weight loss?: No Fatigue?: No  Skin Skin rash/lesions?: No Itching?: No  Eyes Blurred vision?: No Double vision?: No  Ears/Nose/Throat Sore throat?: No Sinus problems?: No  Hematologic/Lymphatic Swollen glands?: No Easy bruising?: No  Cardiovascular Leg swelling?: Yes Chest pain?: No  Respiratory Cough?: No Shortness of breath?:  No  Endocrine Excessive thirst?: No  Musculoskeletal Back pain?: No Joint pain?: No  Neurological Headaches?: No Dizziness?: No  Psychologic Depression?: No Anxiety?: No  Physical Exam: BP 121/77   Pulse 80   Temp 98 F (36.7 C) (Oral)   Ht 5\' 10"  (1.778 m)   Constitutional: Well nourished. Alert. No acute distress.  Smells of vomit.  Vomit seen in wheel chair when patient stood for exam.   HEENT: [place] AT, moist mucus membranes. Trachea midline, no masses. Cardiovascular: No clubbing, cyanosis, or edema. Respiratory: Normal respiratory effort, no increased work of breathing. GI: Abdomen is soft, non tender, non distended, no abdominal masses. Liver and spleen not palpable.  No hernias appreciated.  Stool sample for occult testing is not indicated.   Skin: No rashes, bruises or suspicious lesions. Lymph: No cervical or inguinal adenopathy.  Severe pedal edema with weeping sores. Neurologic: Grossly intact, no focal deficits, moving all 4 extremities. Psychiatric: Normal mood and affect.  Laboratory Data: Lab Results  Component Value Date   WBC 9.5 05/27/2016   HGB 10.8 (L) 05/27/2016   HCT 31.6 (L) 05/27/2016   MCV 85.7 05/27/2016   PLT 210 05/27/2016    Lab Results  Component Value Date   CREATININE 1.16 05/27/2016    Lab Results  Component Value Date   TSH 1.772 04/23/2016     Lab Results  Component Value Date   AST 19 05/21/2016   Lab Results  Component Value Date   ALT 8 (L) 05/21/2016     Pertinent Imaging: CLINICAL DATA:  Central abdominal pain for 1 week. Productive cough since Sunday. Rolled out of bed on Sunday. Bruising of the right side of the face.  EXAM: CT ABDOMEN AND PELVIS WITHOUT CONTRAST  TECHNIQUE: Multidetector CT imaging of the abdomen and pelvis was performed following the standard protocol without IV contrast.  COMPARISON:  CT of the abdomen and pelvis on 10/02/2012  FINDINGS: Lower chest: There is dense opacity  in the medial right lower lobe, somewhat rounded and masslike in appearance, measuring 4.6 centimeters but only partially imaged. Minimal atelectasis identified in the left lower lobe.  Upper  abdomen: No focal abnormality identified within the liver, spleen, pancreas, or adrenal glands. A 4.5 centimeter upper pole cyst is identified in the right kidney. There is no hydronephrosis. Intrarenal calculi are identified in the right kidney. No ureteral obstruction. Largest calculi in the lower pole region on the right measure 1.3 centimeters.  Gastrointestinal tract: The stomach, small bowel loops have a normal appearance. The appendix is well seen and has a normal appearance. Colonic loops are normal in appearance.  Pelvis: Urinary bladder, prostate gland, and seminal vesicles have a normal appearance. No free pelvic fluid.  Retroperitoneum: No retroperitoneal or mesenteric adenopathy. There is atherosclerosis of the abdominal aorta. No aneurysm.  Abdominal wall: Unremarkable.  Osseous structures: Significant degenerative changes in the lower thoracic and lumbar spine. No suspicious lytic or blastic lesions are identified.  IMPRESSION: 1. Dense opacity in the medial aspect of the right lower lobe, possibly representing infectious process. However given the rounded appearance a mass is not excluded and follow-up is recommended. Given location of this opacity it is difficult to image radiographically and follow-up CT of the chest is recommended in 3-4 weeks following trial of antibiotic therapy to ensure resolution and exclude underlying malignancy. 2. Atherosclerosis of the abdominal aorta. 3. 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   Electronically Signed   By: Norva PavlovElizabeth  Brown M.D.   On: 02/21/2015 13:15  Assessment & Plan:    1. Urinary retention:     - foley catheter removed  -voiding trial today    -Home health will place the Foley if he is unable to  void to 3 pm or if during the next month he has suprapubic pain or inability to void  -follow-up in one month for I PSS score, PVR and exam.  2. BPH with LUTS  - Continue conservative management, avoiding bladder irritants and timed voiding's  - Continue tamsulosin 0.4 mg daily and finasteride 5 mg daily  - RTC in March 2018 for IPSS, PVR and exam    Return in about 1 month (around 10/11/2016) for IPSS and PVR.  These notes generated with voice recognition software. I apologize for typographical errors.  Michiel CowboySHANNON Gae Bihl, PA-C  [facility] [address], [address] [place], [place] [address] [phone]

## 2016-09-13 ENCOUNTER — Ambulatory Visit (INDEPENDENT_AMBULATORY_CARE_PROVIDER_SITE_OTHER): Payer: Medicare Other | Admitting: Urology

## 2016-09-13 ENCOUNTER — Encounter: Payer: Self-pay | Admitting: Urology

## 2016-09-13 VITALS — BP 121/77 | HR 80 | Temp 98.0°F | Ht 70.0 in

## 2016-09-13 DIAGNOSIS — N401 Enlarged prostate with lower urinary tract symptoms: Secondary | ICD-10-CM

## 2016-09-13 DIAGNOSIS — N138 Other obstructive and reflux uropathy: Secondary | ICD-10-CM | POA: Diagnosis not present

## 2016-09-13 DIAGNOSIS — R339 Retention of urine, unspecified: Secondary | ICD-10-CM

## 2016-09-13 NOTE — Progress Notes (Signed)
Catheter Removal  Patient is present today for a catheter removal.  10ml of water was drained from the balloon. A 16FR foley cath was removed from the bladder no complications were noted . Patient tolerated well.  Preformed by: Dallas Schimkeamona Brittanya Winburn CMA

## 2016-09-22 ENCOUNTER — Encounter: Payer: Self-pay | Admitting: Emergency Medicine

## 2016-09-22 ENCOUNTER — Emergency Department: Payer: Medicare Other

## 2016-09-22 ENCOUNTER — Inpatient Hospital Stay
Admission: EM | Admit: 2016-09-22 | Discharge: 2016-09-24 | DRG: 683 | Disposition: A | Discharge status: Home / Self Care | Payer: Medicare Other | Attending: Internal Medicine | Admitting: Internal Medicine

## 2016-09-22 ENCOUNTER — Inpatient Hospital Stay: Payer: Medicare Other

## 2016-09-22 DIAGNOSIS — N401 Enlarged prostate with lower urinary tract symptoms: Secondary | ICD-10-CM | POA: Diagnosis present

## 2016-09-22 DIAGNOSIS — Z7982 Long term (current) use of aspirin: Secondary | ICD-10-CM | POA: Diagnosis not present

## 2016-09-22 DIAGNOSIS — Z7901 Long term (current) use of anticoagulants: Secondary | ICD-10-CM

## 2016-09-22 DIAGNOSIS — N179 Acute kidney failure, unspecified: Principal | ICD-10-CM | POA: Diagnosis present

## 2016-09-22 DIAGNOSIS — Z23 Encounter for immunization: Secondary | ICD-10-CM | POA: Diagnosis present

## 2016-09-22 DIAGNOSIS — R569 Unspecified convulsions: Secondary | ICD-10-CM | POA: Diagnosis present

## 2016-09-22 DIAGNOSIS — W06XXXA Fall from bed, initial encounter: Secondary | ICD-10-CM | POA: Diagnosis not present

## 2016-09-22 DIAGNOSIS — F319 Bipolar disorder, unspecified: Secondary | ICD-10-CM | POA: Diagnosis present

## 2016-09-22 DIAGNOSIS — Y92193 Bedroom in other specified residential institution as the place of occurrence of the external cause: Secondary | ICD-10-CM

## 2016-09-22 DIAGNOSIS — E785 Hyperlipidemia, unspecified: Secondary | ICD-10-CM | POA: Diagnosis present

## 2016-09-22 DIAGNOSIS — N390 Urinary tract infection, site not specified: Secondary | ICD-10-CM | POA: Diagnosis present

## 2016-09-22 DIAGNOSIS — I5032 Chronic diastolic (congestive) heart failure: Secondary | ICD-10-CM | POA: Diagnosis present

## 2016-09-22 DIAGNOSIS — M25511 Pain in right shoulder: Secondary | ICD-10-CM

## 2016-09-22 DIAGNOSIS — K219 Gastro-esophageal reflux disease without esophagitis: Secondary | ICD-10-CM | POA: Diagnosis present

## 2016-09-22 DIAGNOSIS — I110 Hypertensive heart disease with heart failure: Secondary | ICD-10-CM | POA: Diagnosis present

## 2016-09-22 DIAGNOSIS — R338 Other retention of urine: Secondary | ICD-10-CM | POA: Diagnosis present

## 2016-09-22 DIAGNOSIS — L89152 Pressure ulcer of sacral region, stage 2: Secondary | ICD-10-CM | POA: Diagnosis present

## 2016-09-22 DIAGNOSIS — J449 Chronic obstructive pulmonary disease, unspecified: Secondary | ICD-10-CM | POA: Diagnosis present

## 2016-09-22 DIAGNOSIS — L8990 Pressure ulcer of unspecified site, unspecified stage: Secondary | ICD-10-CM | POA: Insufficient documentation

## 2016-09-22 DIAGNOSIS — F209 Schizophrenia, unspecified: Secondary | ICD-10-CM | POA: Diagnosis present

## 2016-09-22 DIAGNOSIS — E7800 Pure hypercholesterolemia, unspecified: Secondary | ICD-10-CM | POA: Diagnosis present

## 2016-09-22 DIAGNOSIS — M6281 Muscle weakness (generalized): Secondary | ICD-10-CM

## 2016-09-22 DIAGNOSIS — Z880 Allergy status to penicillin: Secondary | ICD-10-CM | POA: Diagnosis not present

## 2016-09-22 DIAGNOSIS — Z86718 Personal history of other venous thrombosis and embolism: Secondary | ICD-10-CM | POA: Diagnosis not present

## 2016-09-22 DIAGNOSIS — R2681 Unsteadiness on feet: Secondary | ICD-10-CM

## 2016-09-22 DIAGNOSIS — R310 Gross hematuria: Secondary | ICD-10-CM | POA: Diagnosis present

## 2016-09-22 LAB — COMPREHENSIVE METABOLIC PANEL
ALT: 14 U/L — AB (ref 17–63)
AST: 37 U/L (ref 15–41)
Albumin: 2.9 g/dL — ABNORMAL LOW (ref 3.5–5.0)
Alkaline Phosphatase: 70 U/L (ref 38–126)
Anion gap: 12 (ref 5–15)
BUN: 96 mg/dL — AB (ref 6–20)
CHLORIDE: 101 mmol/L (ref 101–111)
CO2: 21 mmol/L — AB (ref 22–32)
CREATININE: 5.4 mg/dL — AB (ref 0.61–1.24)
Calcium: 8.6 mg/dL — ABNORMAL LOW (ref 8.9–10.3)
GFR calc Af Amer: 11 mL/min — ABNORMAL LOW (ref >60)
GFR calc non Af Amer: 9 mL/min — ABNORMAL LOW (ref >60)
GLUCOSE: 107 mg/dL — AB (ref 65–99)
Potassium: 4.5 mmol/L (ref 3.5–5.1)
Sodium: 134 mmol/L — ABNORMAL LOW (ref 135–145)
Total Bilirubin: 0.8 mg/dL (ref 0.3–1.2)
Total Protein: 7.1 g/dL (ref 6.5–8.1)

## 2016-09-22 LAB — URINALYSIS, COMPLETE (UACMP) WITH MICROSCOPIC
Bilirubin Urine: NEGATIVE
GLUCOSE, UA: NEGATIVE mg/dL
KETONES UR: NEGATIVE mg/dL
NITRITE: NEGATIVE
PROTEIN: 30 mg/dL — AB
Specific Gravity, Urine: 1.01 (ref 1.005–1.030)
Squamous Epithelial / LPF: NONE SEEN
pH: 7 (ref 5.0–8.0)

## 2016-09-22 LAB — CBC WITH DIFFERENTIAL/PLATELET
BASOS PCT: 1 %
Basophils Absolute: 0 10*3/uL (ref 0–0.1)
Eosinophils Absolute: 0 10*3/uL (ref 0–0.7)
Eosinophils Relative: 0 %
HEMATOCRIT: 34.5 % — AB (ref 40.0–52.0)
HEMOGLOBIN: 11.7 g/dL — AB (ref 13.0–18.0)
LYMPHS PCT: 10 %
Lymphs Abs: 0.6 10*3/uL — ABNORMAL LOW (ref 1.0–3.6)
MCH: 29 pg (ref 26.0–34.0)
MCHC: 33.9 g/dL (ref 32.0–36.0)
MCV: 85.5 fL (ref 80.0–100.0)
MONOS PCT: 9 %
Monocytes Absolute: 0.5 10*3/uL (ref 0.2–1.0)
NEUTROS ABS: 4.8 10*3/uL (ref 1.4–6.5)
NEUTROS PCT: 80 %
Platelets: 180 10*3/uL (ref 150–440)
RBC: 4.03 MIL/uL — ABNORMAL LOW (ref 4.40–5.90)
RDW: 15.3 % — ABNORMAL HIGH (ref 11.5–14.5)
WBC: 6 10*3/uL (ref 3.8–10.6)

## 2016-09-22 LAB — INFLUENZA PANEL BY PCR (TYPE A & B)
Influenza A By PCR: NEGATIVE
Influenza B By PCR: NEGATIVE

## 2016-09-22 LAB — HEMOGLOBIN: HEMOGLOBIN: 11.2 g/dL — AB (ref 13.0–18.0)

## 2016-09-22 LAB — TROPONIN I: Troponin I: <0.03 ng/mL (ref <0.03)

## 2016-09-22 LAB — MRSA PCR SCREENING: MRSA by PCR: NEGATIVE

## 2016-09-22 LAB — LACTIC ACID, PLASMA: Lactic Acid, Venous: 0.9 mmol/L (ref 0.5–1.9)

## 2016-09-22 LAB — BRAIN NATRIURETIC PEPTIDE: B Natriuretic Peptide: 122 pg/mL — ABNORMAL HIGH (ref 0.0–100.0)

## 2016-09-22 MED ORDER — PHENOBARBITAL 32.4 MG PO TABS
129.6000 mg | ORAL_TABLET | ORAL | Status: DC
  Administered 2016-09-24: 129.6 mg via ORAL
  Filled 2016-09-22: qty 4
  Filled 2016-09-22: qty 2

## 2016-09-22 MED ORDER — ARIPIPRAZOLE 10 MG PO TABS
20.0000 mg | ORAL_TABLET | Freq: Every day | ORAL | Status: DC
  Administered 2016-09-23 – 2016-09-24 (×2): 20 mg via ORAL
  Filled 2016-09-22 (×2): qty 2

## 2016-09-22 MED ORDER — ACETAMINOPHEN 325 MG PO TABS: 650.0000 mg | ORAL_TABLET | Freq: Four times a day (QID) | ORAL | Status: DC | PRN

## 2016-09-22 MED ORDER — CARVEDILOL 12.5 MG PO TABS
12.5000 mg | ORAL_TABLET | Freq: Two times a day (BID) | ORAL | Status: DC
  Administered 2016-09-22 – 2016-09-24 (×4): 12.5 mg via ORAL
  Filled 2016-09-22 (×4): qty 1

## 2016-09-22 MED ORDER — TRAMADOL HCL 50 MG PO TABS
50.0000 mg | ORAL_TABLET | Freq: Two times a day (BID) | ORAL | Status: DC | PRN
  Administered 2016-09-22 – 2016-09-23 (×2): 50 mg via ORAL
  Filled 2016-09-22 (×2): qty 1

## 2016-09-22 MED ORDER — TAMSULOSIN HCL 0.4 MG PO CAPS
0.8000 mg | ORAL_CAPSULE | Freq: Every day | ORAL | Status: DC
  Administered 2016-09-23: 18:00:00 0.8 mg via ORAL
  Filled 2016-09-22: qty 2

## 2016-09-22 MED ORDER — CHOLECALCIFEROL 25 MCG (1000 UT) PO TABS
1000.0000 [IU] | ORAL_TABLET | Freq: Every day | ORAL | Status: DC
  Administered 2016-09-23 – 2016-09-24 (×2): 1000 [IU] via ORAL
  Filled 2016-09-22 (×2): qty 1

## 2016-09-22 MED ORDER — APIXABAN 5 MG PO TABS: 5.0000 mg | ORAL_TABLET | Freq: Two times a day (BID) | ORAL | Status: DC

## 2016-09-22 MED ORDER — PANTOPRAZOLE SODIUM 40 MG PO TBEC
40.0000 mg | DELAYED_RELEASE_TABLET | Freq: Every day | ORAL | Status: DC
  Administered 2016-09-23 – 2016-09-24 (×2): 40 mg via ORAL
  Filled 2016-09-22 (×2): qty 1

## 2016-09-22 MED ORDER — SODIUM CHLORIDE 0.9 % IV SOLN
INTRAVENOUS | Status: DC
  Administered 2016-09-22 – 2016-09-23 (×2): via INTRAVENOUS

## 2016-09-22 MED ORDER — PNEUMOCOCCAL VAC POLYVALENT 25 MCG/0.5ML IJ INJ
0.5000 mL | INJECTION | INTRAMUSCULAR | Status: AC
  Administered 2016-09-23: 0.5 mL via INTRAMUSCULAR
  Filled 2016-09-22: qty 0.5

## 2016-09-22 MED ORDER — ACETAMINOPHEN 650 MG RE SUPP: 650.0000 mg | Freq: Four times a day (QID) | RECTAL | Status: DC | PRN

## 2016-09-22 MED ORDER — ASPIRIN 81 MG PO CHEW
81.0000 mg | CHEWABLE_TABLET | Freq: Every day | ORAL | Status: DC
  Administered 2016-09-23 – 2016-09-24 (×2): 81 mg via ORAL
  Filled 2016-09-22 (×2): qty 1

## 2016-09-22 MED ORDER — FINASTERIDE 5 MG PO TABS
5.0000 mg | ORAL_TABLET | Freq: Every day | ORAL | Status: DC
  Administered 2016-09-23 – 2016-09-24 (×2): 5 mg via ORAL
  Filled 2016-09-22 (×2): qty 1

## 2016-09-22 MED ORDER — POLYETHYLENE GLYCOL 3350 17 G PO PACK: 17.0000 g | PACK | Freq: Every day | ORAL | Status: DC | PRN

## 2016-09-22 MED ORDER — PRAVASTATIN SODIUM 40 MG PO TABS
40.0000 mg | ORAL_TABLET | Freq: Every day | ORAL | Status: DC
  Administered 2016-09-23: 18:00:00 40 mg via ORAL
  Filled 2016-09-22 (×2): qty 1

## 2016-09-22 MED ORDER — HEPARIN SODIUM (PORCINE) 5000 UNIT/ML IJ SOLN: 5000.0000 [IU] | Freq: Three times a day (TID) | INTRAMUSCULAR | Status: DC

## 2016-09-22 MED ORDER — BUPROPION HCL ER (XL) 300 MG PO TB24
300.0000 mg | ORAL_TABLET | Freq: Every day | ORAL | Status: DC
Start: 2016-09-23 — End: 2016-09-24
  Administered 2016-09-23 – 2016-09-24 (×2): 300 mg via ORAL
  Filled 2016-09-22 (×2): qty 1

## 2016-09-22 MED ORDER — CIPROFLOXACIN IN D5W 400 MG/200ML IV SOLN
400.0000 mg | INTRAVENOUS | Status: DC
  Administered 2016-09-22 – 2016-09-23 (×2): 400 mg via INTRAVENOUS
  Filled 2016-09-22 (×3): qty 200

## 2016-09-22 NOTE — H&P (Signed)
[facility] -  at [facility]   PATIENT NAME: [name] SeamenJerry [name]    MR#:  [id number]  DATE OF BIRTH:  1941/01/22  DATE OF ADMISSION:  09/22/2016  PRIMARY CARE PHYSICIAN: Lorenda Ishiharaupashree Varadarajan, MD   REQUESTING/REFERRING PHYSICIAN: Dr Dorothea GlassmanPaul MaLinda  CHIEF COMPLAINT:   Chief Complaint  Patient presents with  . Fall    HISTORY OF PRESENT ILLNESS:  [name] SeamenJerry [name]  is a 76 y.o. male presents to the ER after a fall out of bed with right shoulder pain. Patient states that he feels sick and is not feeling well at all. Patient is not the best historian but does answer some yes or no questions. On further questioning he does have some abdominal pain and does not urinate very much. In the ER he was found to have a creatinine of 5.4 and hospitalist services were contacted for evaluation. Since I've seen this patient in the past and I note that he has an enlarged prostate I asked the ER physician to get a bladder scan. On the bladder scan, a greater than 900 cc of urine and a Foley catheter was ordered.  PAST MEDICAL HISTORY:   Past Medical History:  Diagnosis Date  . Bipolar 1 disorder (HCC)   . CHF (congestive heart failure) (HCC)   . COPD (chronic obstructive pulmonary disease) (HCC)   . DVT (deep venous thrombosis) (HCC)   . GERD (gastroesophageal reflux disease)   . High cholesterol   . Hypertension   . Schizophrenia (HCC)   . Seizure (HCC)     PAST SURGICAL HISTORY:   Past Surgical History:  Procedure Laterality Date  . NO PAST SURGERIES      SOCIAL HISTORY:   Social History  Substance Use Topics  . Smoking status: Never Smoker  . Smokeless tobacco: Never Used  . Alcohol use No    FAMILY HISTORY:   Family History  Problem Relation Age of Onset  . Healthy Mother   . Prostate cancer Neg Hx   . Kidney disease Neg Hx   . Kidney cancer Neg Hx   . Bladder Cancer Neg Hx     DRUG ALLERGIES:   Allergies  Allergen Reactions  . Penicillin G  Other (See Comments)    Convulsions. Has patient had a PCN reaction causing immediate rash, facial/tongue/throat swelling, SOB or lightheadedness with hypotension: not sure Has patient had a PCN reaction causing severe rash involving mucus membranes or skin necrosis: not sure Has patient had a PCN reaction that required hospitalization: not sure Has patient had a PCN reaction occurring within the last 10 years: not sure If all of the above answers are "NO", then may proceed with Cephalosporin use.    REVIEW OF SYSTEMS:  CONSTITUTIONAL: No fever. Positive for chills. Positive for weight loss. No fatigue or weakness.  EYES: No blurred or double vision.  EARS, NOSE, AND THROAT: No tinnitus or ear pain. No sore throat RESPIRATORY: No cough, shortness of breath, wheezing or hemoptysis.  CARDIOVASCULAR: No chest pain, orthopnea, edema.  GASTROINTESTINAL: No nausea, vomiting, diarrhea. Some abdominal pain. No blood in bowel movements GENITOURINARY: No dysuria, hematuria. States he does not urinate very much. ENDOCRINE: No polyuria, nocturia,  HEMATOLOGY: No anemia, easy bruising or bleeding SKIN: Chronic lower extremity discoloration MUSCULOSKELETAL: No joint pain or arthritis.   NEUROLOGIC: No tingling, numbness, weakness.  PSYCHIATRY: History of anxiety and depression.   MEDICATIONS AT HOME:   Prior to Admission medications   Medication Sig Start Date End Date  Taking? Authorizing Provider  acetaminophen (TYLENOL) 325 MG tablet Take 2 tablets (650 mg total) by mouth every 6 (six) hours as needed. 04/26/16  Yes Alford Highland, MD  albuterol (VENTOLIN HFA) 108 (90 Base) MCG/ACT inhaler Inhale 2 puffs into the lungs every 6 (six) hours as needed for shortness of breath.   Yes Historical Provider, MD  amLODipine (NORVASC) 2.5 MG tablet Take 1 tablet (2.5 mg total) by mouth daily. 04/26/16  Yes Alford Highland, MD  apixaban (ELIQUIS) 5 MG TABS tablet Take 1 tablet (5 mg total) by mouth 2 (two)  times daily. 05/31/16  Yes Katharina Caper, MD  ARIPiprazole (ABILIFY) 20 MG tablet Take 1 tablet (20 mg total) by mouth daily. Patient taking differently: Take 2 mg by mouth daily.  04/26/16  Yes Alford Highland, MD  aspirin 81 MG chewable tablet Chew 81 mg by mouth daily.   Yes Historical Provider, MD  buPROPion (BUDEPRION XL) 300 MG 24 hr tablet Take 1 tablet (300 mg total) by mouth daily. 04/26/16  Yes Alford Highland, MD  carvedilol (COREG) 25 MG tablet Take 1 tablet (25 mg total) by mouth 2 (two) times daily. 04/26/16  Yes Ilyssa Grennan Renae Gloss, MD  Cholecalciferol (VITAMIN D-1000 MAX ST) 1000 units tablet Take 1 tablet (1,000 Units total) by mouth daily. 04/26/16  Yes Eric Nees Renae Gloss, MD  finasteride (PROSCAR) 5 MG tablet Take 1 tablet (5 mg total) by mouth daily. 04/27/16  Yes Alford Highland, MD  furosemide (LASIX) 40 MG tablet Take 40 mg by mouth daily.    Yes Historical Provider, MD  omeprazole (PRILOSEC) 20 MG capsule Take 2 capsules (40 mg total) by mouth daily. 04/26/16  Yes Percy Winterrowd Renae Gloss, MD  PHENobarbital (LUMINAL) 64.8 MG tablet Take 2 tablets (129.6 mg total) by mouth every other day. 04/26/16  Yes Saw Mendenhall Renae Gloss, MD  polyethylene glycol (MIRALAX / GLYCOLAX) packet Take 17 g by mouth daily as needed for moderate constipation. 04/26/16  Yes Brier Firebaugh Renae Gloss, MD  potassium chloride (KLOR-CON) 20 MEQ packet Take 20 mEq by mouth daily.    Yes Historical Provider, MD  pravastatin (PRAVACHOL) 40 MG tablet Take 1 tablet (40 mg total) by mouth daily. 04/26/16  Yes Alford Highland, MD  tamsulosin (FLOMAX) 0.4 MG CAPS capsule Take 1 capsule (0.4 mg total) by mouth daily. 06/16/16  Yes Shannon A McGowan, PA-C  traMADol (ULTRAM) 50 MG tablet Take 1 tablet (50 mg total) by mouth every 6 (six) hours as needed for moderate pain or severe pain. 05/28/16  Yes Katharina Caper, MD      VITAL SIGNS:  Blood pressure 102/63, pulse 66, temperature 97.8 F (36.6 C), temperature source Oral, resp. rate (!) 23, height 5'  10" (1.778 m), weight 114.8 kg (253 lb), SpO2 100 %.  PHYSICAL EXAMINATION:  GENERAL:  76 y.o.-year-old patient lying in the bed with no acute distress.  EYES: Pupils equal, round, reactive to light and accommodation. No scleral icterus. Extraocular muscles intact.  HEENT: Head atraumatic, normocephalic. Oropharynx and nasopharynx clear.  NECK:  Supple, no jugular venous distention. No thyroid enlargement, no tenderness.  LUNGS: Normal breath sounds bilaterally, no wheezing, rales,rhonchi or crepitation. No use of accessory muscles of respiration.  CARDIOVASCULAR: S1, S2 normal. No murmurs, rubs, or gallops.  ABDOMEN: Soft, Lower abdomen tenderness over distended bladder. Bowel sounds present. No organomegaly.  EXTREMITIES: 3+ edema, no cyanosis, or clubbing. Painful to palpation over the shoulder anteriorly and laterally and posteriorly. Patient able to lift his arm up but not full range of motion.  With passive range of motion it is painful but unable to move his arm up over his head. NEUROLOGIC: Cranial nerves II through XII are intact. Sensation intact. Gait not checked.  PSYCHIATRIC: The patient is alert and oriented x 3.  SKIN: Chronic lower extremity skin discoloration  LABORATORY PANEL:   CBC  Recent Labs Lab 09/22/16 1024  WBC 6.0  HGB 11.7*  HCT 34.5*  PLT 180   ------------------------------------------------------------------------------------------------------------------  Chemistries   Recent Labs Lab 09/22/16 1024  NA 134*  K 4.5  CL 101  CO2 21*  GLUCOSE 107*  BUN 96*  CREATININE 5.40*  CALCIUM 8.6*  AST 37  ALT 14*  ALKPHOS 70  BILITOT 0.8   ------------------------------------------------------------------------------------------------------------------  Cardiac Enzymes  Recent Labs Lab 09/22/16 1024  TROPONINI <0.03    ------------------------------------------------------------------------------------------------------------------  RADIOLOGY:  Ct Head Wo Contrast  Result Date: 09/22/2016 CLINICAL DATA:  Recent fall EXAM: CT HEAD WITHOUT CONTRAST CT CERVICAL SPINE WITHOUT CONTRAST TECHNIQUE: Multidetector CT imaging of the head and cervical spine was performed following the standard protocol without intravenous contrast. Multiplanar CT image reconstructions of the cervical spine were also generated. COMPARISON:  05/21/2016 FINDINGS: CT HEAD FINDINGS Brain: Atrophic and chronic white matter ischemic changes are again noted. 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 No findings to suggest acute hemorrhage, acute infarction or space-occupying mass lesion are noted. Vascular: No hyperdense vessel or unexpected calcification. Skull: Normal. Negative for fracture or focal lesion. Sinuses/Orbits: No acute finding. Other: None. CT CERVICAL SPINE FINDINGS Alignment: Within normal limits. Skull base and vertebrae: 7 cervical segments are well visualized. Vertebral body height is well maintained. No acute fracture or acute facet abnormality is noted. Multilevel osteophytic changes are seen. Facet hypertrophic changes are noted as well with bilateral neural foraminal narrowing. Soft tissues and spinal canal: No soft tissue abnormality is noted. Disc levels:  Disc space narrowing is noted at C5-6 and C6-7. Upper chest: Scarring is noted in the left apex medially. IMPRESSION: CT of the head: Chronic atrophic and ischemic changes without acute abnormality. CT of the cervical spine: Multilevel degenerative changes without acute abnormality. Electronically Signed   By: Alcide Clever M.D.   On: 09/22/2016 10:54   Ct Cervical Spine Wo Contrast  Result Date: 09/22/2016 CLINICAL DATA:  Recent fall EXAM: CT HEAD WITHOUT CONTRAST CT CERVICAL SPINE WITHOUT CONTRAST TECHNIQUE: Multidetector CT imaging of  the head and cervical spine was performed following the standard protocol without intravenous contrast. Multiplanar CT image reconstructions of the cervical spine were also generated. COMPARISON:  05/21/2016 FINDINGS: CT HEAD FINDINGS Brain: Atrophic and chronic white matter ischemic changes are again noted. 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 No findings to suggest acute hemorrhage, acute infarction or space-occupying mass lesion are noted. Vascular: No hyperdense vessel or unexpected calcification. Skull: Normal. Negative for fracture or focal lesion. Sinuses/Orbits: No acute finding. Other: None. CT CERVICAL SPINE FINDINGS Alignment: Within normal limits. Skull base and vertebrae: 7 cervical segments are well visualized. Vertebral body height is well maintained. No acute fracture or acute facet abnormality is noted. Multilevel osteophytic changes are seen. Facet hypertrophic changes are noted as well with bilateral neural foraminal narrowing. Soft tissues and spinal canal: No soft tissue abnormality is noted. Disc levels:  Disc space narrowing is noted at C5-6 and C6-7. Upper chest: Scarring is noted in the left apex medially. IMPRESSION: CT of the head: Chronic atrophic and ischemic changes  without acute abnormality. CT of the cervical spine: Multilevel degenerative changes without acute abnormality. Electronically Signed   By: Alcide Clever M.D.   On: 09/22/2016 10:54   Dg Chest Portable 1 View  Result Date: 09/22/2016 CLINICAL DATA:  Larey Seat out of bed this morning, hit right shoulder and head EXAM: PORTABLE CHEST 1 VIEW COMPARISON:  05/21/2016 FINDINGS: Cardiomediastinal silhouette is stable. No infiltrate or pleural effusion. No pulmonary edema. Degenerative changes bilateral shoulders. IMPRESSION: No active disease. Electronically Signed   By: Natasha Mead M.D.   On: 09/22/2016 10:26    EKG:   Normal sinus rhythm, incomplete right bundle branch  block  IMPRESSION AND PLAN:   1. Acute kidney injury secondary to urinary retention and BPH. Increase Flomax to 0.8 mg daily continue finasteride. Foley catheter placed in the ER. Foley catheter will have to be continued for a full week and follow-up with urology for a voiding trial as outpatient. This has happened to the patient in the past also. 2. Right shoulder pain. X-ray right shoulder. Occupational therapy evaluation. 3. History of DVT on Eliquis 4. History of bipolar disorder and schizophrenia continue psychiatric medications 5. History of seizure continue phenobarbital 6. Essential hypertension decreased dose of Coreg and hold Norvasc 7. Hyperlipidemia unspecified on pravastatin 8. Chronic lower extremity edema. Hopefully once creatinine improves can restart Lasix 9. History of congestive heart failure. Currently no signs. Gentle hydration for now.  All the records are reviewed and case discussed with ED provider. Management plans discussed with the patient, and he is in agreement.  CODE STATUS: Full code  TOTAL TIME TAKING CARE OF THIS PATIENT: 50 minutes.    Alford Highland M.D on 09/22/2016 at 12:26 PM  Between 7am to 6pm - Pager - [phone]  After 6pm call admission pager [phone]  [facility]  [phone]  CC: Primary care physician; Lorenda Ishihara, MD

## 2016-09-22 NOTE — ED Provider Notes (Addendum)
[facility] Emergency Department Provider Note   ____________________________________________   First MD Initiated Contact with Patient 09/22/16 1005     (approximate)  I have reviewed the triage vital signs and the nursing notes.   HISTORY  Chief Complaint Fall    HPI [name] is a 76 y.o. male patient comes in with EMS reports he had an unwitnessed fall out of bed he probably rolled out of bed and hit his head and right shoulder on something. Nursing home staff sent him in for that and because he was having rectal bleeding not making as much urine as usual and having leg swelling. I should say more leg swelling than usual. She reports she feels okay.   Past Medical History:  Diagnosis Date  . Bipolar 1 disorder (HCC)   . CHF (congestive heart failure) (HCC)   . COPD (chronic obstructive pulmonary disease) (HCC)   . DVT (deep venous thrombosis) (HCC)   . GERD (gastroesophageal reflux disease)   . High cholesterol   . Hypertension   . Schizophrenia (HCC)   . Seizure [facility])     Patient Active Problem List   Diagnosis Date Noted  . Back pain 05/28/2016  . Sepsis (HCC) 05/27/2016  . UTI (urinary tract infection) 05/27/2016  . Escherichia coli (E. coli) infection 05/27/2016  . DVT of lower extremity, bilateral (HCC) 05/27/2016  . 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 BPH (benign prostatic hyperplasia) 05/27/2016  . Acute on chronic renal failure (HCC) 05/27/2016  . Bipolar disorder (HCC) 05/27/2016  . Generalized weakness 05/27/2016  . 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 C. difficile colitis 05/27/2016  . Acute kidney injury (HCC) 05/21/2016  . UTI (lower urinary tract infection) 04/23/2016  . 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 Pneumonia 02/21/2015  . ARF (acute renal failure) (HCC) 02/21/2015  . Hypokalemia 02/21/2015  . Leukocytosis 02/21/2015  . Venous insufficiency of  both lower extremities 12/04/2014  . Benign essential hypertension 11/25/2014  . Bilateral carotid artery stenosis 11/15/2014  . Bilateral leg edema 11/15/2014  . COPD (chronic obstructive pulmonary disease) (HCC) 11/15/2014  . Gastroesophageal reflux disease 11/15/2014  . Mild aortic valve stenosis 11/15/2014  . Mixed hyperlipidemia 11/15/2014  . Obesity 11/15/2014  . Seizure disorder (HCC) 11/15/2014    Past Surgical History:  Procedure Laterality Date  . NO PAST SURGERIES      Prior to Admission medications   Medication Sig Start Date End Date Taking? Authorizing Provider  acetaminophen (TYLENOL) 325 MG tablet Take 2 tablets (650 mg total) by mouth every 6 (six) hours as needed. 04/26/16  Yes Alford Highland, MD  albuterol (VENTOLIN HFA) 108 (90 Base) MCG/ACT inhaler Inhale 2 puffs into the lungs every 6 (six) hours as needed for shortness of breath.   Yes Historical Provider, MD  amLODipine (NORVASC) 2.5 MG tablet Take 1 tablet (2.5 mg total) by mouth daily. 04/26/16  Yes Alford Highland, MD  apixaban (ELIQUIS) 5 MG TABS tablet Take 1 tablet (5 mg total) by mouth 2 (two) times daily. 05/31/16  Yes Katharina Caper, MD  ARIPiprazole (ABILIFY) 20 MG tablet Take 1 tablet (20 mg total) by mouth daily. Patient taking differently: Take 2 mg by mouth daily.  04/26/16  Yes Alford Highland, MD  aspirin 81 MG chewable tablet Chew 81 mg by mouth daily.   Yes Historical Provider, MD  buPROPion (BUDEPRION XL) 300 MG 24 hr tablet Take 1 tablet (300 mg  total) by mouth daily. 04/26/16  Yes Alford Highlandichard Wieting, MD  carvedilol (COREG) 25 MG tablet Take 1 tablet (25 mg total) by mouth 2 (two) times daily. 04/26/16  Yes Richard Renae GlossWieting, MD  Cholecalciferol (VITAMIN D-1000 MAX ST) 1000 units tablet Take 1 tablet (1,000 Units total) by mouth daily. 04/26/16  Yes Richard Renae GlossWieting, MD  finasteride (PROSCAR) 5 MG tablet Take 1 tablet (5 mg total) by mouth daily. 04/27/16  Yes Alford Highlandichard Wieting, MD  furosemide (LASIX) 40 MG  tablet Take 40 mg by mouth daily.    Yes Historical Provider, MD  omeprazole (PRILOSEC) 20 MG capsule Take 2 capsules (40 mg total) by mouth daily. 04/26/16  Yes Richard Renae GlossWieting, MD  PHENobarbital (LUMINAL) 64.8 MG tablet Take 2 tablets (129.6 mg total) by mouth every other day. 04/26/16  Yes Richard Renae GlossWieting, MD  polyethylene glycol (MIRALAX / GLYCOLAX) packet Take 17 g by mouth daily as needed for moderate constipation. 04/26/16  Yes Richard Renae GlossWieting, MD  potassium chloride (KLOR-CON) 20 MEQ packet Take 20 mEq by mouth daily.    Yes Historical Provider, MD  pravastatin (PRAVACHOL) 40 MG tablet Take 1 tablet (40 mg total) by mouth daily. 04/26/16  Yes Alford Highlandichard Wieting, MD  tamsulosin (FLOMAX) 0.4 MG CAPS capsule Take 1 capsule (0.4 mg total) by mouth daily. 06/16/16  Yes Shannon A McGowan, PA-C  traMADol (ULTRAM) 50 MG tablet Take 1 tablet (50 mg total) by mouth every 6 (six) hours as needed for moderate pain or severe pain. 05/28/16  Yes Katharina Caperima Vaickute, MD    Allergies Penicillin g  Family History  Problem Relation Age of Onset  . Healthy Mother   . Prostate cancer Neg Hx   . Kidney disease Neg Hx   . Kidney cancer Neg Hx   . Bladder Cancer Neg Hx     Social History Social History  Substance Use Topics  . Smoking status: Never Smoker  . Smokeless tobacco: Never Used  . Alcohol use No    Review of Systems Constitutional: No fever/chills Eyes: No visual changes. ENT: No sore throat. Cardiovascular: Denies chest pain. Respiratory: Denies shortness of breath. Gastrointestinal: No abdominal pain.  No nausea, no vomiting.  No diarrhea.  No constipation. Genitourinary: Negative for dysuria. Musculoskeletal: Negative for back pain. Skin: Negative for rash. Neurological: Negative for headaches, focal weakness or numbness.  10-point ROS otherwise negative.  ____________________________________________   PHYSICAL EXAM:  VITAL SIGNS: ED Triage Vitals  Enc Vitals Group     BP       Pulse      Resp      Temp      Temp src      SpO2      Weight      Height      Head Circumference      Peak Flow      Pain Score      Pain Loc      Pain Edu?      Excl. in GC?     Constitutional: Alert and oriented.  in no acute distress. Eyes: Conjunctivae are normal. PERRL. EOMI. Head: Atraumatic. Nose: No congestion/rhinnorhea. Mouth/Throat: Mucous membranes are moist.  Oropharynx non-erythematous. Neck: No stridor.   Cardiovascular: Normal rate, regular rhythm. Grossly normal heart sounds.  Good peripheral circulation. Respiratory: Normal respiratory effort.  No retractions. Lungs CTAB. Chest: There is a bruise and abrasion on the back of the right humerus and the corresponding part of the chest this next to that posteriorly. Does  not appear to be particularly tender. Gastrointestinal: Soft and nontender. Some distention. No abdominal bruits. No CVA tenderness. Musculoskeletal: No lower extremity tenderness but there is marked edema. Calf is a red bilaterally they are not particularly warm and patient reports is no tenderness. No joint effusions. Neurologic:  Normal speech and language. No gross focal neurologic deficits are appreciated. No gait instability.   ____________________________________________   LABS (all labs ordered are listed, but only abnormal results are displayed)  Labs Reviewed  COMPREHENSIVE METABOLIC PANEL - Abnormal; Notable for the following:       Result Value   Sodium 134 (*)    CO2 21 (*)    Glucose, Bld 107 (*)    BUN 96 (*)    Creatinine, Ser 5.40 (*)    Calcium 8.6 (*)    Albumin 2.9 (*)    ALT 14 (*)    GFR calc non Af Amer 9 (*)    GFR calc Af Amer 11 (*)    All other components within normal limits  CBC WITH DIFFERENTIAL/PLATELET - Abnormal; Notable for the following:    RBC 4.03 (*)    Hemoglobin 11.7 (*)    HCT 34.5 (*)    RDW 15.3 (*)    Lymphs Abs 0.6 (*)    All other components within normal limits  TROPONIN I  LACTIC  ACID, PLASMA  INFLUENZA PANEL BY PCR (TYPE A & B)  BRAIN NATRIURETIC PEPTIDE  URINALYSIS, COMPLETE (UACMP) WITH MICROSCOPIC   ____________________________________________  EKG  KG read and interpreted by me shows wide QRS of 121 ms possibly left bundle there are newly inverted T waves in lead 26F and V5 and V6 computer is reading her degree AV block but the baseline is too irregular for me to see that and the heart rate is fairly rapid which makes me wonder if that reading is correct. ____________________________________________  RADIOLOGY  Study Result   CLINICAL DATA:  Recent fall  EXAM: CT HEAD WITHOUT CONTRAST  CT CERVICAL SPINE WITHOUT CONTRAST  TECHNIQUE: Multidetector CT imaging of the head and cervical spine was performed following the standard protocol without intravenous contrast. Multiplanar CT image reconstructions of the cervical spine were also generated.  COMPARISON:  05/21/2016  FINDINGS: CT HEAD FINDINGS  Brain: Atrophic and chronic white matter ischemic changes are again noted. 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 No findings to suggest acute hemorrhage, acute infarction or space-occupying mass lesion are noted.  Vascular: No hyperdense vessel or unexpected calcification.  Skull: Normal. Negative for fracture or focal lesion.  Sinuses/Orbits: No acute finding.  Other: None.  CT CERVICAL SPINE FINDINGS  Alignment: Within normal limits.  Skull base and vertebrae: 7 cervical segments are well visualized. Vertebral body height is well maintained. No acute fracture or acute facet abnormality is noted. Multilevel osteophytic changes are seen. Facet hypertrophic changes are noted as well with bilateral neural foraminal narrowing.  Soft tissues and spinal canal: No soft tissue abnormality is noted.  Disc levels:  Disc space narrowing is noted at C5-6 and C6-7.  Upper chest: Scarring is  noted in the left apex medially.  IMPRESSION: CT of the head: Chronic atrophic and ischemic changes without acute abnormality.  CT of the cervical spine: Multilevel degenerative changes without acute abnormality.   Electronically Signed   By: Alcide Clever M.D.   On: 09/22/2016 10:54     ____________________________________________   PROCEDURES  Procedure(s) performed:  Procedures  Critical  Care performed:   ____________________________________________   INITIAL IMPRESSION / ASSESSMENT AND PLAN / ED COURSE  Pertinent labs & imaging results that were available during my care of the patient were reviewed by me and considered in my medical decision making (see chart for details).        ____________________________________________   FINAL CLINICAL IMPRESSION(S) / ED DIAGNOSES  Final diagnoses:  AKI (acute kidney injury) (HCC)   Other diagnosis is included increasing edema and new EKG changes    NEW MEDICATIONS STARTED DURING THIS VISIT:  New Prescriptions   No medications on file     Note:  This document was prepared using Dragon voice recognition software and may include unintentional dictation errors.    Arnaldo Natal, MD 09/22/16 1143    Arnaldo Natal, MD 09/22/16 1146

## 2016-09-22 NOTE — Consult Note (Signed)
Pharmacy Antibiotic Note  [name] is a 76 y.o. male admitted on 09/22/2016 with UTI.  Pharmacy has been consulted for ciprofloxacin dosing. Pt has a PNC allergy, when asked about SOB, swelling, rash pt was unsure. Per Care everywhere- reaction was convulsions  Plan: ciprofloxacin 400mg  IV q 24hr  Height: 5\' 10"  (177.8 cm) Weight: 253 lb (114.8 kg) IBW/kg (Calculated) : 73  Temp (24hrs), Avg:97.9 F (36.6 C), Min:97.8 F (36.6 C), Max:98 F (36.7 C)   Recent Labs Lab 09/22/16 1010 09/22/16 1024  WBC  --  6.0  CREATININE  --  5.40*  LATICACIDVEN 0.9  --     Estimated Creatinine Clearance: 15.5 mL/min (by C-G formula based on SCr of 5.4 mg/dL (H)).    Allergies  Allergen Reactions  . Penicillin G Other (See Comments)    Convulsions. Has patient had a PCN reaction causing immediate rash, facial/tongue/throat swelling, SOB or lightheadedness with hypotension: not sure Has patient had a PCN reaction causing severe rash involving mucus membranes or skin necrosis: not sure Has patient had a PCN reaction that required hospitalization: not sure Has patient had a PCN reaction occurring within the last 10 years: not sure If all of the above answers are "NO", then may proceed with Cephalosporin use.    Antimicrobials this admission: ciprofloxacin 2/14 >>    Dose adjustments this admission:   Microbiology results:  2/14 UCx:     Thank you for allowing pharmacy to be a part of this patient's care.  Olene FlossMelissa D Pattiann Solanki, Pharm.D, BCPS Clinical Pharmacist  09/22/2016 3:13 PM

## 2016-09-22 NOTE — Clinical Social Work Note (Addendum)
CSW received consult that patient is from a facility.  Patient is from [facility].  CSW to meet with patient and complete assessment at a later time.  3:30pm  CSW attempted to see patient to complete assessment patient was sleeping and did not wake up, CSW to try at a later time.  Ervin KnackEric R. Nikolaj Geraghty, MSW, Theresia MajorsLCSWA [phone]  09/22/2016 2:06 PM

## 2016-09-22 NOTE — Progress Notes (Signed)
[facility]  Patient Status: Active  [facility] is providing the following services: SN/PT/OT  If patient discharges after hours, please call [phone].   [name] CaseyJason E [name] 09/22/2016, 2:49 PM

## 2016-09-22 NOTE — ED Triage Notes (Signed)
Arrives via [facility] from [facility] for co fall.  Per EMS patient had an unwitnessed fall early this morning, fell out of bed onto floor hitting right shoulder and head.  Patient denies LOC and complains of right shoulder pain.  EMS also report that facility is concerned about bilateral lower extremity swelling and blood in stool for about 1 week.

## 2016-09-22 NOTE — Progress Notes (Signed)
Patient ID: [name] DarbyJerry L [name], male   DOB: 07-May-1941, 76 y.o.   MRN: [id number]  Gross hematuria now seen in Foley drainage. Still making urine. Case discussed with urology to see.  Hold eliquis for now. Serial hemoglobin.  Need to decide whether the hematuria is going to clear up and whether the patient is a candidate to go back on the eliquis or whether a filter needs to be placed  Dr. Renae GlossWieting

## 2016-09-22 NOTE — Consult Note (Addendum)
Urology Consult  I have been asked to see the patient by Dr. Renae Gloss, for evaluation and management of gross hematuria.  Chief Complaint: blood in urine  History of Present Illness: [name] is a 76 y.o. year old history of urinary retention, gross hematuria, BPH who is admitted today after falling out of his bed at a group home. In the emergency room, he was noted to be in acute renal failure at which time a Foley catheter was placed and greater than a liter of bloody urine was drained from his bladder.  He does have a history of DVT and is currently on aspirin and Elliquis which has been held.  Urine culture is pending.    Patient is known to our practice and has previously seen our PA, Michiel Cowboy.  Notably, he is a poor historian and much of his history was obtained from her previous note. He previously presented to the emergency room with difficulty voiding on 9/17 and he was started on finasteride in addition to terazosin. In 05/2016 a Foley catheter was inserted and he drained 3 L of urine.  Around that time, his terazosin was changed to Flomax and finasteride was continued.  He was admitted to the hospital for a UTI, acute renal failure, and DVT.  He failed multiple voiding trials thereafter.  His Foley catheter was removed by our office on 09/13/2016 with instructions for the group home to replace by 3 PM if he was unable.  Most recent rectal exam on 06/2016 showed a 50+ gram prostate without any obvious nodules but notably exam was limited by habitus.  No recent PSAs.  Most recent upper tract imaging included a renal ultrasound from 05/2016 which showed mild bilateral cortical thinning, nonobstructing right upper pole stone, and bilateral renal cyst but otherwise unremarkable.   Past Medical History:  Diagnosis Date  . Bipolar 1 disorder (HCC)   . CHF (congestive heart failure) (HCC)   . COPD (chronic obstructive pulmonary disease) (HCC)   . DVT (deep venous  thrombosis) (HCC)   . GERD (gastroesophageal reflux disease)   . High cholesterol   . Hypertension   . Schizophrenia (HCC)   . Seizure [facility])     Past Surgical History:  Procedure Laterality Date  . NO PAST SURGERIES      Home Medications:  Current Meds  Medication Sig  . acetaminophen (TYLENOL) 325 MG tablet Take 2 tablets (650 mg total) by mouth every 6 (six) hours as needed.  Marland Kitchen albuterol (VENTOLIN HFA) 108 (90 Base) MCG/ACT inhaler Inhale 2 puffs into the lungs every 6 (six) hours as needed for shortness of breath.  Marland Kitchen amLODipine (NORVASC) 2.5 MG tablet Take 1 tablet (2.5 mg total) by mouth daily.  Marland Kitchen apixaban (ELIQUIS) 5 MG TABS tablet Take 1 tablet (5 mg total) by mouth 2 (two) times daily.  . ARIPiprazole (ABILIFY) 20 MG tablet Take 1 tablet (20 mg total) by mouth daily. (Patient taking differently: Take 2 mg by mouth daily. )  . aspirin 81 MG chewable tablet Chew 81 mg by mouth daily.  Marland Kitchen buPROPion (BUDEPRION XL) 300 MG 24 hr tablet Take 1 tablet (300 mg total) by mouth daily.  . carvedilol (COREG) 25 MG tablet Take 1 tablet (25 mg total) by mouth 2 (two) times daily.  . Cholecalciferol (VITAMIN D-1000 MAX ST) 1000 units tablet Take 1 tablet (1,000 Units total) by mouth daily.  . finasteride (PROSCAR) 5 MG tablet Take 1 tablet (5 mg total)  by mouth daily.  . furosemide (LASIX) 40 MG tablet Take 40 mg by mouth daily.   Marland Kitchen. omeprazole (PRILOSEC) 20 MG capsule Take 2 capsules (40 mg total) by mouth daily.  Marland Kitchen. PHENobarbital (LUMINAL) 64.8 MG tablet Take 2 tablets (129.6 mg total) by mouth every other day.  . polyethylene glycol (MIRALAX / GLYCOLAX) packet Take 17 g by mouth daily as needed for moderate constipation.  . potassium chloride (KLOR-CON) 20 MEQ packet Take 20 mEq by mouth daily.   . pravastatin (PRAVACHOL) 40 MG tablet Take 1 tablet (40 mg total) by mouth daily.  . tamsulosin (FLOMAX) 0.4 MG CAPS capsule Take 1 capsule (0.4 mg total) by mouth daily.  . traMADol (ULTRAM) 50 MG  tablet Take 1 tablet (50 mg total) by mouth every 6 (six) hours as needed for moderate pain or severe pain.  . [DISCONTINUED] apixaban (ELIQUIS) 5 MG TABS tablet Take 2 tablets (10 mg total) by mouth 2 (two) times daily. (Patient taking differently: Take 5 mg by mouth 2 (two) times daily. )    Allergies:  Allergies  Allergen Reactions  . Penicillin G Other (See Comments)    Convulsions. Has patient had a PCN reaction causing immediate rash, facial/tongue/throat swelling, SOB or lightheadedness with hypotension: not sure Has patient had a PCN reaction causing severe rash involving mucus membranes or skin necrosis: not sure Has patient had a PCN reaction that required hospitalization: not sure Has patient had a PCN reaction occurring within the last 10 years: not sure If all of the above answers are "NO", then may proceed with Cephalosporin use.    Family History  Problem Relation Age of Onset  . Healthy Mother   . Prostate cancer Neg Hx   . Kidney disease Neg Hx   . Kidney cancer Neg Hx   . Bladder Cancer Neg Hx     Social History:  reports that he has never smoked. He has never used smokeless tobacco. He reports that he does not drink alcohol or use drugs.  ROS: A complete review of systems was performed.  This is positive for pain and bruising over the right side including chest and leg from recent fall. He also reports chills and weight loss. He denies any overt urinary symptoms and review systems is otherwise unremarkable. He is somewhat of a limited historian.  Physical Exam:  Vital signs in last 24 hours: Temp:  [97.8 F (36.6 C)-98 F (36.7 C)] 98 F (36.7 C) (02/14 1426) Pulse Rate:  [64-83] 80 (02/14 1426) Resp:  [14-23] 16 (02/14 1313) BP: (102-126)/(53-88) 110/57 (02/14 1426) SpO2:  [94 %-100 %] 100 % (02/14 1426) Weight:  [253 lb (114.8 kg)] 253 lb (114.8 kg) (02/14 1009) Constitutional: Well nourished. Alert and oriented, No acute distress. HEENT: [place] AT, moist  mucus membranes. Trachea midline, no masses. Cardiovascular: No clubbing, cyanosis, or severe pitting edema of lower extremities Respiratory: Normal respiratory effort, no increased work of breathing. GI: Abdomen is soft, non tender, non distended, no abdominal masses. GU: No CVA tenderness.  No bladder fullness or masses.  Patient with buried phallus. Foley in place.   Skin: No rashes, bruises or suspicious lesions. Neurologic: Grossly intact, no focal deficits, moving all 4 extremities. Psychiatric: Normal mood and affect.  Laboratory Data:   Recent Labs  09/22/16 1024 09/22/16 1657  WBC 6.0  --   HGB 11.7* 11.2*  HCT 34.5*  --     Recent Labs  09/22/16 1024  NA 134*  K 4.5  CL 101  CO2 21*  GLUCOSE 107*  BUN 96*  CREATININE 5.40*  CALCIUM 8.6*   Component     Latest Ref Rng & Units 09/22/2016  Color, Urine     YELLOW AMBER (A)  Appearance     CLEAR CLOUDY (A)  Specific Gravity, Urine     1.005 - 1.030 1.010  pH     5.0 - 8.0 7.0  Glucose     NEGATIVE mg/dL NEGATIVE  Hgb urine dipstick     NEGATIVE MODERATE (A)  Bilirubin Urine     NEGATIVE NEGATIVE  Ketones, ur     NEGATIVE mg/dL NEGATIVE  Protein     NEGATIVE mg/dL 30 (A)  Nitrite     NEGATIVE NEGATIVE  Leukocytes, UA     NEGATIVE LARGE (A)  RBC / HPF     0 - 5 RBC/hpf TOO NUMEROUS TO COUNT  WBC, UA     0 - 5 WBC/hpf TOO NUMEROUS TO COUNT  Bacteria, UA     NONE SEEN MANY (A)  Squamous Epithelial / LPF     NONE SEEN NONE SEEN  WBC Clumps      PRESENT  Amorphous Crystal      PRESENT    Radiologic Imaging: Dg Shoulder Right  Result Date: 09/22/2016 CLINICAL DATA:  Fall with right shoulder pain, initial encounter EXAM: RIGHT SHOULDER - 2+ VIEW COMPARISON:  None. FINDINGS: Degenerative changes of the acromioclavicular joint are seen. Glenohumeral degenerative changes are noted as well. No acute fracture or dislocation is noted. No soft tissue changes are seen. IMPRESSION: Degenerative change  without acute abnormality. Electronically Signed   By: Alcide Clever M.D.   On: 09/22/2016 13:07   Ct Head Wo Contrast  Result Date: 09/22/2016 CLINICAL DATA:  Recent fall EXAM: CT HEAD WITHOUT CONTRAST CT CERVICAL SPINE WITHOUT CONTRAST TECHNIQUE: Multidetector CT imaging of the head and cervical spine was performed following the standard protocol without intravenous contrast. Multiplanar CT image reconstructions of the cervical spine were also generated. COMPARISON:  05/21/2016 FINDINGS: CT HEAD FINDINGS Brain: Atrophic and chronic white matter ischemic changes are again noted. 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 No findings to suggest acute hemorrhage, acute infarction or space-occupying mass lesion are noted. Vascular: No hyperdense vessel or unexpected calcification. Skull: Normal. Negative for fracture or focal lesion. Sinuses/Orbits: No acute finding. Other: None. CT CERVICAL SPINE FINDINGS Alignment: Within normal limits. Skull base and vertebrae: 7 cervical segments are well visualized. Vertebral body height is well maintained. No acute fracture or acute facet abnormality is noted. Multilevel osteophytic changes are seen. Facet hypertrophic changes are noted as well with bilateral neural foraminal narrowing. Soft tissues and spinal canal: No soft tissue abnormality is noted. Disc levels:  Disc space narrowing is noted at C5-6 and C6-7. Upper chest: Scarring is noted in the left apex medially. IMPRESSION: CT of the head: Chronic atrophic and ischemic changes without acute abnormality. CT of the cervical spine: Multilevel degenerative changes without acute abnormality. Electronically Signed   By: Alcide Clever M.D.   On: 09/22/2016 10:54   Ct Cervical Spine Wo Contrast  Result Date: 09/22/2016 CLINICAL DATA:  Recent fall EXAM: CT HEAD WITHOUT CONTRAST CT CERVICAL SPINE WITHOUT CONTRAST TECHNIQUE: Multidetector CT imaging of the head and cervical spine was  performed following the standard protocol without intravenous contrast. Multiplanar CT image reconstructions of the cervical spine were also generated. COMPARISON:  05/21/2016 FINDINGS: CT HEAD FINDINGS Brain:  Atrophic and chronic white matter ischemic changes are again noted. 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 No findings to suggest acute hemorrhage, acute infarction or space-occupying mass lesion are noted. Vascular: No hyperdense vessel or unexpected calcification. Skull: Normal. Negative for fracture or focal lesion. Sinuses/Orbits: No acute finding. Other: None. CT CERVICAL SPINE FINDINGS Alignment: Within normal limits. Skull base and vertebrae: 7 cervical segments are well visualized. Vertebral body height is well maintained. No acute fracture or acute facet abnormality is noted. Multilevel osteophytic changes are seen. Facet hypertrophic changes are noted as well with bilateral neural foraminal narrowing. Soft tissues and spinal canal: No soft tissue abnormality is noted. Disc levels:  Disc space narrowing is noted at C5-6 and C6-7. Upper chest: Scarring is noted in the left apex medially. IMPRESSION: CT of the head: Chronic atrophic and ischemic changes without acute abnormality. CT of the cervical spine: Multilevel degenerative changes without acute abnormality. Electronically Signed   By: Alcide Clever M.D.   On: 09/22/2016 10:54   Dg Chest Portable 1 View  Result Date: 09/22/2016 CLINICAL DATA:  Larey Seat out of bed this morning, hit right shoulder and head EXAM: PORTABLE CHEST 1 VIEW COMPARISON:  05/21/2016 FINDINGS: Cardiomediastinal silhouette is stable. No infiltrate or pleural effusion. No pulmonary edema. Degenerative changes bilateral shoulders. IMPRESSION: No active disease. Electronically Signed   By: Natasha Mead M.D.   On: 09/22/2016 10:26   Procedure: Patient was hand irrigated at bedside today with approximately 1 L of sterile saline.  Approximately 25 cc of dark clot were evacuated but no additional clot thereafter. The urine remained cranberry colored after hand irrigation of the catheter appeared to be draining well.  Impression/ Plan:   1. UTI UA today highly suspicious for urinary tract infection, has grown Escherichia coli/VRE in urine in the past which is sensitive to Cipro Started on Cipro today  Urine culture pending, adjust as needed  2. Gross hematuria Hospital secondary to #1 along with stretch injury of the bladder exacerbated by anticoagulation/ antiplatelet therapy Medications being held this evening, reassess degree of blood tomorrow and need for IVC filter Hand irrigate Foley when necessary overnight, call urology if catheter obstructs frequently to discuss upsizing of catheter and initiation of CBI as needed Reassess in a.m, CBC Will need outpatient cysto  3. Urinary retention/ BPH History of massive urinary retention with up to 3 L, 1 L in the bladder today upon Foley catheter placement with acute renal failure Status post multiple failed voiding trials Continue finasteride and Flomax Patient will likely need urodynamics and/ or discussion of chronic indwelling catheter, CIC, versus SP tube given multiple failed voiding trials PSA as outpatient once acute retention episode has resolved  4. Acute renal failure Baseline Cr appears to be ~1.1 Likely secondary to outlet obstruction Monitor for postobstructive diuresis and replace fluids as needed Trend creatinine  09/22/2016, 5:38 PM  Vanna Scotland,  MD   Thank you for involving me in this patient's care,  will continue to follow along.

## 2016-09-22 NOTE — ED Notes (Signed)
Lunch tray given. 

## 2016-09-22 NOTE — ED Notes (Signed)
Continues to eat lunch.  Will transfer to floor once done eating.

## 2016-09-23 DIAGNOSIS — L8990 Pressure ulcer of unspecified site, unspecified stage: Secondary | ICD-10-CM | POA: Insufficient documentation

## 2016-09-23 LAB — URINE CULTURE

## 2016-09-23 LAB — CBC
HCT: 30.3 % — ABNORMAL LOW (ref 40.0–52.0)
Hemoglobin: 10.5 g/dL — ABNORMAL LOW (ref 13.0–18.0)
MCH: 29.3 pg (ref 26.0–34.0)
MCHC: 34.6 g/dL (ref 32.0–36.0)
MCV: 84.8 fL (ref 80.0–100.0)
Platelets: 176 10*3/uL (ref 150–440)
RBC: 3.57 MIL/uL — ABNORMAL LOW (ref 4.40–5.90)
RDW: 15.5 % — ABNORMAL HIGH (ref 11.5–14.5)
WBC: 4.6 10*3/uL (ref 3.8–10.6)

## 2016-09-23 LAB — BASIC METABOLIC PANEL
ANION GAP: 11 (ref 5–15)
BUN: 83 mg/dL — ABNORMAL HIGH (ref 6–20)
CALCIUM: 7.9 mg/dL — AB (ref 8.9–10.3)
CO2: 22 mmol/L (ref 22–32)
Chloride: 105 mmol/L (ref 101–111)
Creatinine, Ser: 3.26 mg/dL — ABNORMAL HIGH (ref 0.61–1.24)
GFR, EST AFRICAN AMERICAN: 20 mL/min — AB (ref >60)
GFR, EST NON AFRICAN AMERICAN: 17 mL/min — AB (ref >60)
Glucose, Bld: 88 mg/dL (ref 65–99)
Potassium: 3.6 mmol/L (ref 3.5–5.1)
SODIUM: 138 mmol/L (ref 135–145)

## 2016-09-23 NOTE — Progress Notes (Addendum)
[facility] [place] at [facility] [place]   PATIENT NAME: [name]    MR#:  [id number]  DATE OF BIRTH:  1940/10/14  SUBJECTIVE:  CHIEF COMPLAINT:  Resting comfortably with no new complaints  REVIEW OF SYSTEMS:  CONSTITUTIONAL: No fever, fatigue or weakness.  EYES: No blurred or double vision.  EARS, NOSE, AND THROAT: No tinnitus or ear pain.  RESPIRATORY: No cough, shortness of breath, wheezing or hemoptysis.  CARDIOVASCULAR: No chest pain, orthopnea, edema.  GASTROINTESTINAL: No nausea, vomiting, diarrhea or abdominal pain.  GENITOURINARY: No dysuria, hematuria.  ENDOCRINE: No polyuria, nocturia,  HEMATOLOGY: No anemia, easy bruising or bleeding SKIN: No rash or lesion. MUSCULOSKELETAL: No joint pain or arthritis.   NEUROLOGIC: No tingling, numbness, weakness.  PSYCHIATRY: No anxiety or depression.   DRUG ALLERGIES:   Allergies  Allergen Reactions  . Penicillin G Other (See Comments)    Convulsions. Has patient had a PCN reaction causing immediate rash, facial/tongue/throat swelling, SOB or lightheadedness with hypotension: not sure Has patient had a PCN reaction causing severe rash involving mucus membranes or skin necrosis: not sure Has patient had a PCN reaction that required hospitalization: not sure Has patient had a PCN reaction occurring within the last 10 years: not sure If all of the above answers are "NO", then may proceed with Cephalosporin use.    VITALS:  Blood pressure 138/69, pulse 90, temperature 98.3 F (36.8 C), temperature source Oral, resp. rate 16, height 5\' 10"  (1.778 m), weight 114.8 kg (253 lb), SpO2 98 %.  PHYSICAL EXAMINATION:  GENERAL:  76 y.o.-year-old patient lying in the bed with no acute distress.  EYES: Pupils equal, round, reactive to light and accommodation. No scleral icterus. Extraocular muscles intact.  HEENT: Head atraumatic, normocephalic. Oropharynx and nasopharynx clear.  NECK:  Supple, no jugular venous  distention. No thyroid enlargement, no tenderness.  LUNGS: Normal breath sounds bilaterally, no wheezing, rales,rhonchi or crepitation. No use of accessory muscles of respiration.  CARDIOVASCULAR: S1, S2 normal. No murmurs, rubs, or gallops.  ABDOMEN: Soft, nontender, nondistended. Bowel sounds present. No organomegaly or mass.  EXTREMITIES: No pedal edema, cyanosis, or clubbing.  NEUROLOGIC: Cranial nerves II through XII are intact. Muscle strength 5/5 in all extremities. Sensation intact. Gait not checked.  PSYCHIATRIC: The patient is alert and oriented x 3.  SKIN: No obvious rash, lesion, or ulcer.    LABORATORY PANEL:   CBC  Recent Labs Lab 09/23/16 0431  WBC 4.6  HGB 10.5*  HCT 30.3*  PLT 176   ------------------------------------------------------------------------------------------------------------------  Chemistries   Recent Labs Lab 09/22/16 1024 09/23/16 0431  NA 134* 138  K 4.5 3.6  CL 101 105  CO2 21* 22  GLUCOSE 107* 88  BUN 96* 83*  CREATININE 5.40* 3.26*  CALCIUM 8.6* 7.9*  AST 37  --   ALT 14*  --   ALKPHOS 70  --   BILITOT 0.8  --    ------------------------------------------------------------------------------------------------------------------  Cardiac Enzymes  Recent Labs Lab 09/22/16 1024  TROPONINI <0.03   ------------------------------------------------------------------------------------------------------------------  RADIOLOGY:  Dg Shoulder Right  Result Date: 09/22/2016 CLINICAL DATA:  Fall with right shoulder pain, initial encounter EXAM: RIGHT SHOULDER - 2+ VIEW COMPARISON:  None. FINDINGS: Degenerative changes of the acromioclavicular joint are seen. Glenohumeral degenerative changes are noted as well. No acute fracture or dislocation is noted. No soft tissue changes are seen. IMPRESSION: Degenerative change without acute abnormality. Electronically Signed   By: Alcide Clever M.D.   On: 09/22/2016 13:07  Ct Head Wo  Contrast  Result Date: 09/22/2016 CLINICAL DATA:  Recent fall EXAM: CT HEAD WITHOUT CONTRAST CT CERVICAL SPINE WITHOUT CONTRAST TECHNIQUE: Multidetector CT imaging of the head and cervical spine was performed following the standard protocol without intravenous contrast. Multiplanar CT image reconstructions of the cervical spine were also generated. COMPARISON:  05/21/2016 FINDINGS: CT HEAD FINDINGS Brain: Atrophic and chronic white matter ischemic changes are again noted. 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 No findings to suggest acute hemorrhage, acute infarction or space-occupying mass lesion are noted. Vascular: No hyperdense vessel or unexpected calcification. Skull: Normal. Negative for fracture or focal lesion. Sinuses/Orbits: No acute finding. Other: None. CT CERVICAL SPINE FINDINGS Alignment: Within normal limits. Skull base and vertebrae: 7 cervical segments are well visualized. Vertebral body height is well maintained. No acute fracture or acute facet abnormality is noted. Multilevel osteophytic changes are seen. Facet hypertrophic changes are noted as well with bilateral neural foraminal narrowing. Soft tissues and spinal canal: No soft tissue abnormality is noted. Disc levels:  Disc space narrowing is noted at C5-6 and C6-7. Upper chest: Scarring is noted in the left apex medially. IMPRESSION: CT of the head: Chronic atrophic and ischemic changes without acute abnormality. CT of the cervical spine: Multilevel degenerative changes without acute abnormality. Electronically Signed   By: Alcide Clever M.D.   On: 09/22/2016 10:54   Ct Cervical Spine Wo Contrast  Result Date: 09/22/2016 CLINICAL DATA:  Recent fall EXAM: CT HEAD WITHOUT CONTRAST CT CERVICAL SPINE WITHOUT CONTRAST TECHNIQUE: Multidetector CT imaging of the head and cervical spine was performed following the standard protocol without intravenous contrast. Multiplanar CT image reconstructions of  the cervical spine were also generated. COMPARISON:  05/21/2016 FINDINGS: CT HEAD FINDINGS Brain: Atrophic and chronic white matter ischemic changes are again noted. 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 No findings to suggest acute hemorrhage, acute infarction or space-occupying mass lesion are noted. Vascular: No hyperdense vessel or unexpected calcification. Skull: Normal. Negative for fracture or focal lesion. Sinuses/Orbits: No acute finding. Other: None. CT CERVICAL SPINE FINDINGS Alignment: Within normal limits. Skull base and vertebrae: 7 cervical segments are well visualized. Vertebral body height is well maintained. No acute fracture or acute facet abnormality is noted. Multilevel osteophytic changes are seen. Facet hypertrophic changes are noted as well with bilateral neural foraminal narrowing. Soft tissues and spinal canal: No soft tissue abnormality is noted. Disc levels:  Disc space narrowing is noted at C5-6 and C6-7. Upper chest: Scarring is noted in the left apex medially. IMPRESSION: CT of the head: Chronic atrophic and ischemic changes without acute abnormality. CT of the cervical spine: Multilevel degenerative changes without acute abnormality. Electronically Signed   By: Alcide Clever M.D.   On: 09/22/2016 10:54   Dg Chest Portable 1 View  Result Date: 09/22/2016 CLINICAL DATA:  Larey Seat out of bed this morning, hit right shoulder and head EXAM: PORTABLE CHEST 1 VIEW COMPARISON:  05/21/2016 FINDINGS: Cardiomediastinal silhouette is stable. No infiltrate or pleural effusion. No pulmonary edema. Degenerative changes bilateral shoulders. IMPRESSION: No active disease. Electronically Signed   By: Natasha Mead M.D.   On: 09/22/2016 10:26    EKG:   Orders placed or performed during the hospital encounter of 09/22/16  . ED EKG  . ED EKG  . EKG 12-Lead  . EKG 12-Lead    ASSESSMENT AND PLAN:   #. Acute kidney injury secondary  to urinary retention 2/2  BPH.  Renal function is better. Creatinine 5.40-3.26  urine cultures are pending Flomax to 0.8 mg daily continue finasteride.   Foley catheter will have to be continued for a full week and follow-up with urology for a voiding trial as outpatient. This has happened to the patient in the past also.  #Gross hematuria secondary to problem #1 stretch injury of the bladder exacerbated by anticoagulation Continue finasteride and Flomax Renal function is getting better Patient needs outpatient urodynamics and/or chronic indwelling catheter Appreciate urology's recommendations Holding Eliquis  #. Right shoulder pain. X-ray right shoulder. Occupational therapy evaluation. No acute fractures. Degenerative changes  #. 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 History of bipolar disorder and schizophrenia continue psychiatric medications  #. History of seizure continue phenobarbital  #. Essential hypertension decreased dose of Coreg and hold Norvasc  #. Hyperlipidemia unspecified on pravastatin  #  Chronic lower extremity edema. Hopefully once creatinine improves can restart Lasix  PT is recommending home health PT   All the records are reviewed and case discussed with Care Management/Social Workerr. Management plans discussed with the patient, family and they are in agreement.  CODE STATUS: FC   TOTAL TIME TAKING CARE OF THIS PATIENT: 37  minutes.   POSSIBLE D/C IN 1-2 DAYS, DEPENDING ON CLINICAL CONDITION.  Note: This dictation was prepared with Dragon dictation along with smaller phrase technology. Any transcriptional errors that result from this process are unintentional.   Ramonita LabGouru, Shanine Kreiger M.D on 09/23/2016 at 3:56 PM  Between 7am to 6pm - Pager - [phone] After 6pm go to [email or web address] - password [email or web address] [facility]  Office  [phone]  CC: Primary care physician; Lorenda Ishiharaupashree Varadarajan, MD

## 2016-09-23 NOTE — Plan of Care (Signed)
Problem: Health Behavior/Discharge Planning: Goal: Ability to manage health-related needs will improve Outcome: Not Applicable Date Met: 62/70/35 Pt is from group home   Problem: Activity: Goal: Risk for activity intolerance will decrease Outcome: Not Progressing Severe BLE edema.  Pt currently on bed rest.  Uses walker for ambulation.

## 2016-09-23 NOTE — Evaluation (Signed)
Physical Therapy Evaluation Patient Details Name: [name] MRN: [id number] DOB: 1940-09-30 Today's Date: 09/23/2016   History of Present Illness  [name]  is a 76 y.o. male presents to the ER after a fall out of bed with right shoulder pain. Patient reported was feeling sick and not well and was brought to the ER. In the ER he was found to have a creatinine of 5.4 and hospitalist services were contacted for evaluation. Pt has a history of an enlarged prostate and on the bladder scan in the ER was found to have greater than 900 cc of urine and a Foley catheter was ordered. Pt now has a Foley in place. He is currently admitted for UTI and associated hematuria. Pt also with urinary retention, BPH, and acute renal failure. Per facility pt has been ambulating in the last week or so with [facility] PT  Clinical Impression  Pt admitted with above diagnosis. Pt currently with functional limitations due to the deficits listed below (see PT Problem List). Pt appears to have poor baseline mobility and has mostly been using a wheelchair at ALF and requiring assist from staff to transfer. He has severe bilateral LE swelling on this date which is somewhat limiting to his mobility. He requires modA+1 for bed mobility and transfers. He is unable to stabilize in standing and requires modA+1 as well as bilateral UE support to remain in standing. He will need to continue [facility] PT once he returns to ALF but appears to have good support for all ADLs/IADLs and mobility. Pt will benefit from skilled PT services to address deficits in strength, balance, and mobility in order to return to full function at home.     Follow Up Recommendations Home health PT;Other (comment) (Continue HH PT at [facility])    Equipment Recommendations  None recommended by PT    Recommendations for Other Services       Precautions / Restrictions Precautions Precautions: Fall Restrictions Weight Bearing Restrictions: No       Mobility  Bed Mobility Overal bed mobility: Needs Assistance Bed Mobility: Supine to Sit;Sit to Supine     Supine to sit: Mod assist Sit to supine: Mod assist   General bed mobility comments: Pt requires assist for bilateral LE and trunk righting when moving up to EOB. Initially requiring UE support for sitting balance but eventually is abel to balance and take challenge without deficit noted. ModA+1 for LE assist when going from sitting to supine.  Transfers Overall transfer level: Needs assistance Equipment used: 1 person hand held assist Transfers: Sit to/from Stand Sit to Stand: Mod assist         General transfer comment: Pt requires modA+1 to go from sitting to standing with heavy UE support and 1 HHA. Once upright pt requires maxA+1 to remain upright due to poor balance. Unable to ambulate at this time  Ambulation/Gait             General Gait Details: Unable/unsafe at this time  Stairs            Wheelchair Mobility    Modified Rankin (Stroke Patients Only)       Balance Overall balance assessment: Needs assistance Sitting-balance support: No upper extremity supported;Feet supported Sitting balance-Leahy Scale: Good Sitting balance - Comments: Able to take challenge to sitting balance   Standing balance support: Bilateral upper extremity supported Standing balance-Leahy Scale: Poor Standing balance comment: Pt requires mod to maxA+1 to remain upright in standing due to  posterior LOB                             Pertinent Vitals/Pain Pain Assessment: 0-10 Pain Score: 6  Pain Location: R shoulder Pain Descriptors / Indicators: Aching Pain Intervention(s): Monitored during session;Other (comment) (Medical staff aware)    Home Living Family/patient expects to be discharged to:: Assisted living               Home Equipment: Walker - 2 wheels;Wheelchair - manual (no shower chair, no BSC)      Prior Function Level of  Independence: Needs assistance   Gait / Transfers Assistance Needed: Pt reports he mostly uses wheelchair for mobility. He transfers with assist from staff of ALF. He reports that this most recent fall is his only fall in the last 12 months. Received return call from facility who reports that he was moving with a walker with HH PT approximately 1 week ago  ADL's / Homemaking Assistance Needed: Independent with dressing but requires assist for bathing. Able to feed himself. Meals provided at facility. Requires assist for all IADLs  Comments: From the medical record it appears that in the last 12 months pt was ambulating reasonable distances but has a progressive decline in his mobility     Hand Dominance   Dominant Hand: Right    Extremity/Trunk Assessment   Upper Extremity Assessment Upper Extremity Assessment: Generalized weakness;RUE deficits/detail RUE Deficits / Details: R shoulder pain with AROM flexion as well as resisted R shoulder flexion. Otherwise bilateral UE are grossly weak and deconditioned    Lower Extremity Assessment Lower Extremity Assessment: Generalized weakness;RLE deficits/detail RLE Deficits / Details: Pt with generalized weakness in bilateral LE. He has severe bilateral LE edema as well as redness. Limited bilateral ankle DF secondary to swelling       Communication   Communication: No difficulties  Cognition Arousal/Alertness: Awake/alert Behavior During Therapy: Flat affect Overall Cognitive Status: History of cognitive impairments - at baseline                 General Comments: Pt has a history of bipolar 1 and schizophrenia. He is AOx4 at time of PT evaluation.    General Comments      Exercises     Assessment/Plan    PT Assessment Patient needs continued PT services  PT Problem List Decreased strength;Decreased range of motion;Decreased balance;Decreased mobility;Decreased coordination;Decreased knowledge of use of DME;Decreased safety  awareness;Decreased knowledge of precautions;Obesity;Pain          PT Treatment Interventions DME instruction;Gait training;Stair training;Functional mobility training;Therapeutic activities;Therapeutic exercise;Balance training;Neuromuscular re-education;Patient/family education    PT Goals (Current goals can be found in the Care Plan section)  Acute Rehab PT Goals Patient Stated Goal: Return to prior level of function at ALF PT Goal Formulation: With patient Time For Goal Achievement: 10/07/16 Potential to Achieve Goals: Fair    Frequency Min 2X/week   Barriers to discharge        Co-evaluation               End of Session Equipment Utilized During Treatment: Gait belt Activity Tolerance: Patient tolerated treatment well Patient left: in bed;with call bell/phone within reach;with bed alarm set Nurse Communication: Mobility status;Other (comment) (Pt upright eating breakfast)         Time: 1308-6578 PT Time Calculation (min) (ACUTE ONLY): 21 min   Charges:   PT Evaluation $PT Eval Moderate Complexity: 1 Procedure  PT G Codes:       Sharalyn InkJason D Obdulia Steier PT, DPT   Nahiem Dredge 09/23/2016, 10:26 AM

## 2016-09-23 NOTE — Progress Notes (Signed)
Urology Consult Follow Up  Subjective: Patient resting comfortably in bed.  No complaints during the night per nursing staff.  Some small clots were seen early this am and Sarah, RN flushed with 80 cc.  Urine continues to be cranberry colored.   I also irrigated this am with minimal clot returned.  VSS afebrile.  Hbg slightly down to 10.5 from 11.2.  Creatinine is also improving.  UOP is good.  BMP is stable.   Anti-infectives: Anti-infectives    Start     Dose/Rate Route Frequency Ordered Stop   09/22/16 1700  ciprofloxacin (CIPRO) IVPB 400 mg     400 mg 200 mL/hr over 60 Minutes Intravenous Every 24 hours 09/22/16 1512        Current Facility-Administered Medications  Medication Dose Route Frequency Provider Last Rate Last Dose  . 0.9 %  sodium chloride infusion   Intravenous Continuous Alford Highlandichard Wieting, MD 40 mL/hr at 09/23/16 0400    . acetaminophen (TYLENOL) tablet 650 mg  650 mg Oral Q6H PRN Alford Highlandichard Wieting, MD       Or  . acetaminophen (TYLENOL) suppository 650 mg  650 mg Rectal Q6H PRN Alford Highlandichard Wieting, MD      . ARIPiprazole (ABILIFY) tablet 20 mg  20 mg Oral Daily Alford Highlandichard Wieting, MD      . aspirin chewable tablet 81 mg  81 mg Oral Daily Alford Highlandichard Wieting, MD      . buPROPion (WELLBUTRIN XL) 24 hr tablet 300 mg  300 mg Oral Daily Alford Highlandichard Wieting, MD      . carvedilol (COREG) tablet 12.5 mg  12.5 mg Oral BID Alford Highlandichard Wieting, MD   12.5 mg at 09/22/16 2055  . Cholecalciferol 1,000 Units  1,000 Units Oral Daily Alford Highlandichard Wieting, MD      . ciprofloxacin (CIPRO) IVPB 400 mg  400 mg Intravenous Q24H Olene FlossMelissa D Maccia, RPH   400 mg at 09/22/16 1755  . finasteride (PROSCAR) tablet 5 mg  5 mg Oral Daily Alford Highlandichard Wieting, MD      . pantoprazole (PROTONIX) EC tablet 40 mg  40 mg Oral Daily Alford Highlandichard Wieting, MD      . Melene Muller[START ON 09/24/2016] PHENobarbital (LUMINAL) tablet 129.6 mg  129.6 mg Oral QODAY Richard Renae GlossWieting, MD      . pneumococcal 23 valent vaccine (PNU-IMMUNE) injection 0.5 mL  0.5 mL  Intramuscular Tomorrow-1000 Alford Highlandichard Wieting, MD      . polyethylene glycol (MIRALAX / GLYCOLAX) packet 17 g  17 g Oral Daily PRN Alford Highlandichard Wieting, MD      . pravastatin (PRAVACHOL) tablet 40 mg  40 mg Oral q1800 Alford Highlandichard Wieting, MD      . tamsulosin [facility](FLOMAX) capsule 0.8 mg  0.8 mg Oral QPC supper Alford Highlandichard Wieting, MD      . traMADol Janean Sark(ULTRAM) tablet 50 mg  50 mg Oral Q12H PRN Alford Highlandichard Wieting, MD   50 mg at 09/22/16 1513     Objective: Vital signs in last 24 hours: Temp:  [97.6 F (36.4 C)-98.2 F (36.8 C)] 98.2 F (36.8 C) (02/15 0318) Pulse Rate:  [64-83] 80 (02/15 0318) Resp:  [14-23] 18 (02/15 0318) BP: (102-130)/(53-88) 130/64 (02/15 0318) SpO2:  [94 %-100 %] 98 % (02/15 0318) Weight:  [253 lb (114.8 kg)] 253 lb (114.8 kg) (02/14 1009)  Intake/Output from previous day: 02/14 0701 - 02/15 0700 In: 801.7 [I.V.:521.7; IV Piggyback:200] Out: 8800 [Urine:8800] Intake/Output this shift: No intake/output data recorded.   Physical Exam Constitutional: Well nourished. Alert and oriented, No acute  distress. HEENT: [place] AT, moist mucus membranes. Trachea midline, no masses. Cardiovascular: No clubbing, cyanosis, or severe pitting edema of lower extremities Respiratory: Normal respiratory effort, no increased work of breathing. GI: Abdomen is soft, non tender, non distended, no abdominal masses. Liver and spleen not palpable.  No hernias appreciated.   GU: No CVA tenderness.  No bladder fullness or masses.  Patient with buried phallus. Foley in place.   Skin: No rashes, bruises or suspicious lesions. Lymph: No cervical or inguinal adenopathy. Neurologic: Grossly intact, no focal deficits, moving all 4 extremities. Psychiatric: Normal mood and affect.  Lab Results:   Recent Labs  09/22/16 1024 09/22/16 1657 09/23/16 0431  WBC 6.0  --  4.6  HGB 11.7* 11.2* 10.5*  HCT 34.5*  --  30.3*  PLT 180  --  176   BMET  Recent Labs  09/22/16 1024 09/23/16 0431  NA 134* 138  K 4.5  3.6  CL 101 105  CO2 21* 22  GLUCOSE 107* 88  BUN 96* 83*  CREATININE 5.40* 3.26*  CALCIUM 8.6* 7.9*   PT/INR No results for input(s): LABPROT, INR in the last 72 hours. ABG No results for input(s): PHART, HCO3 in the last 72 hours.  Invalid input(s): PCO2, PO2  Studies/Results: Dg Shoulder Right  Result Date: 09/22/2016 CLINICAL DATA:  Fall with right shoulder pain, initial encounter EXAM: RIGHT SHOULDER - 2+ VIEW COMPARISON:  None. FINDINGS: Degenerative changes of the acromioclavicular joint are seen. Glenohumeral degenerative changes are noted as well. No acute fracture or dislocation is noted. No soft tissue changes are seen. IMPRESSION: Degenerative change without acute abnormality. Electronically Signed   By: Alcide Clever M.D.   On: 09/22/2016 13:07   Ct Head Wo Contrast  Result Date: 09/22/2016 CLINICAL DATA:  Recent fall EXAM: CT HEAD WITHOUT CONTRAST CT CERVICAL SPINE WITHOUT CONTRAST TECHNIQUE: Multidetector CT imaging of the head and cervical spine was performed following the standard protocol without intravenous contrast. Multiplanar CT image reconstructions of the cervical spine were also generated. COMPARISON:  05/21/2016 FINDINGS: CT HEAD FINDINGS Brain: Atrophic and chronic white matter ischemic changes are again noted. 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 No findings to suggest acute hemorrhage, acute infarction or space-occupying mass lesion are noted. Vascular: No hyperdense vessel or unexpected calcification. Skull: Normal. Negative for fracture or focal lesion. Sinuses/Orbits: No acute finding. Other: None. CT CERVICAL SPINE FINDINGS Alignment: Within normal limits. Skull base and vertebrae: 7 cervical segments are well visualized. Vertebral body height is well maintained. No acute fracture or acute facet abnormality is noted. Multilevel osteophytic changes are seen. Facet hypertrophic changes are noted as well with bilateral  neural foraminal narrowing. Soft tissues and spinal canal: No soft tissue abnormality is noted. Disc levels:  Disc space narrowing is noted at C5-6 and C6-7. Upper chest: Scarring is noted in the left apex medially. IMPRESSION: CT of the head: Chronic atrophic and ischemic changes without acute abnormality. CT of the cervical spine: Multilevel degenerative changes without acute abnormality. Electronically Signed   By: Alcide Clever M.D.   On: 09/22/2016 10:54   Ct Cervical Spine Wo Contrast  Result Date: 09/22/2016 CLINICAL DATA:  Recent fall EXAM: CT HEAD WITHOUT CONTRAST CT CERVICAL SPINE WITHOUT CONTRAST TECHNIQUE: Multidetector CT imaging of the head and cervical spine was performed following the standard protocol without intravenous contrast. Multiplanar CT image reconstructions of the cervical spine were also generated. COMPARISON:  05/21/2016 FINDINGS: CT HEAD FINDINGS  Brain: Atrophic and chronic white matter ischemic changes are again noted. 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 No findings to suggest acute hemorrhage, acute infarction or space-occupying mass lesion are noted. Vascular: No hyperdense vessel or unexpected calcification. Skull: Normal. Negative for fracture or focal lesion. Sinuses/Orbits: No acute finding. Other: None. CT CERVICAL SPINE FINDINGS Alignment: Within normal limits. Skull base and vertebrae: 7 cervical segments are well visualized. Vertebral body height is well maintained. No acute fracture or acute facet abnormality is noted. Multilevel osteophytic changes are seen. Facet hypertrophic changes are noted as well with bilateral neural foraminal narrowing. Soft tissues and spinal canal: No soft tissue abnormality is noted. Disc levels:  Disc space narrowing is noted at C5-6 and C6-7. Upper chest: Scarring is noted in the left apex medially. IMPRESSION: CT of the head: Chronic atrophic and ischemic changes without acute abnormality. CT of  the cervical spine: Multilevel degenerative changes without acute abnormality. Electronically Signed   By: Alcide Clever M.D.   On: 09/22/2016 10:54   Dg Chest Portable 1 View  Result Date: 09/22/2016 CLINICAL DATA:  Larey Seat out of bed this morning, hit right shoulder and head EXAM: PORTABLE CHEST 1 VIEW COMPARISON:  05/21/2016 FINDINGS: Cardiomediastinal silhouette is stable. No infiltrate or pleural effusion. No pulmonary edema. Degenerative changes bilateral shoulders. IMPRESSION: No active disease. Electronically Signed   By: Natasha Mead M.D.   On: 09/22/2016 10:26   Procedure Patient was hand irrigated at bedside this am with approximately 500 cc of sterile saline.  Minimal clot were evacuated (5cc) urine cranberry colored and catheter is draining well.    Assessment & Plan 1. 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 Gross hematuria Hospital secondary to #1 along with stretch injury of the bladder exacerbated by anticoagulation/ antiplatelet therapy Medications being held this evening, reassess degree of blood tomorrow and need for IVC filter No major issues during the night - continue present course with hand irrigation of  Foley when necessary call urology if catheter obstructs frequently to discuss upsizing of catheter and initiation of CBI as needed Hbg with no major changes now at 10.5 Will need outpatient cysto Continue to follow   3. Urinary retention/ BPH History of massive urinary retention with up to 3 L, 1 L in the bladder today upon Foley catheter placement with acute renal failure Status post multiple failed voiding trials Continue finasteride and Flomax Patient will likely need urodynamics and/ or discussion of chronic indwelling catheter, CIC, versus SP tube given multiple failed  voiding trials PSA as outpatient once acute retention episode has resolved  4. Acute renal failure Baseline Cr appears to be ~1.1 Likely secondary to outlet obstruction Monitor for postobstructive diuresis and replace fluids as needed Trend creatinine - creatinine is improving now at 3.26     LOS: 1 day    [facility] 09/23/2016

## 2016-09-23 NOTE — Progress Notes (Signed)
Pt continues to have red urine output.  Had been clear with no clots up until 4 AM.  Flushed with 80 cc with 300 returned.  Urine now flowing in foley is red, clear, no odor. Pt sleeping and appears to be in no distress. Henriette CombsSarah Giovanna Kemmerer RN

## 2016-09-23 NOTE — Evaluation (Addendum)
Occupational Therapy Evaluation Patient Details Name: [name] MRN: [id number] DOB: 1941-07-27 Today's Date: 09/23/2016    History of Present Illness Pt. is a 76 y.o. male who was admitted to [facility] with right shoulder pain sustained in a fall, and a UTI, hematuria, urinary retention, BPH, and Renal Failure. Pt. PMHx includes: Acute Kidney Disaese, Right shoulder pain, Bipolar Disorder, CHF, COPD, DVT, GERD, High Cholesterol, HTN, Schiizophrenia, and seizure.   Clinical Impression   Pt. Is a 75 y.o. Male who was admitted to [facility] with right shoulder pain sustained in a fall, UTI, and hematuria. Pt. Presents with weakness, right shoulder pain, increased BLE edema, and impaired functional mobility which limits his ability to complete ADL, and IADL tasks. Pt. Could benefit from skilled OT services for ADL training, A/E training, UE there. Ex, there. Act., and pt/family staff education about positioning. Pt. Could benefit from follow-up OT services.     Follow Up Recommendations  SNF    Equipment Recommendations       Recommendations for Other Services Rehab consult     Precautions / Restrictions Precautions Precautions: Fall Restrictions Weight Bearing Restrictions: No              ADL Overall ADL's : Needs assistance/impaired Eating/Feeding: Set up;Sitting   Grooming: Set up;Sitting           Upper Body Dressing : Moderate assistance   Lower Body Dressing: Maximal assistance (Increased edema in bilateral LE's)               Functional mobility during ADLs: Maximal assistance       Vision     Perception     Praxis      Pertinent Vitals/Pain Pain Assessment: 0-10 Pain Score: 6  Pain Location: RIght shoulder Pain Descriptors / Indicators: Aching Pain Intervention(s): Monitored during session     Hand Dominance Right   Extremity/Trunk Assessment Upper Extremity Assessment Upper Extremity Assessment: Generalized weakness RUE Deficits /  Details:  (Pain in right shoulder with ROM)       Communication Communication Communication: No difficulties   Cognition Arousal/Alertness: Awake/alert Behavior During Therapy: Flat affect Overall Cognitive Status: History of cognitive impairments - at baseline                 General Comments: Bipolar 1, and Schizophrenia.   General Comments       Exercises       Shoulder Instructions      Home Living Family/patient expects to be discharged to:: Assisted living                             Home Equipment: Walker - 2 wheels;Wheelchair - manual;Shower seat          Prior Functioning/Environment Level of Independence: Needs assistance  Gait / Transfers Assistance Needed: Pt reports he mostly uses wheelchair for mobility. He transfers with assist from staff of ALF. He reports that this most recent fall is his only fall in the last 12 months. Received return call from facility who reports that he was moving with a walker with HH PT approximately 1 week ago ADL's / Homemaking Assistance Needed: Pt. used a w/c, transfers for toileting with the staff, Pt.is able to dress self, however requires assist from staff for bathing. Independent self-feeding with meal preparation, and meds provided by staff. Pt. requires assist with all IADLs.   Comments: From the medical record it appears that  in the last 12 months pt was ambulating reasonable distances but has a progressive decline in his mobility        OT Problem List:     OT Treatment/Interventions: Self-care/ADL training;Therapeutic exercise;Patient/family education;Therapeutic activities;Energy conservation;DME and/or AE instruction    OT Goals(Current goals can be found in the care plan section) Acute Rehab OT Goals Patient Stated Goal: To return to the Group Home OT Goal Formulation: With patient Potential to Achieve Goals: Good  OT Frequency: Min 1X/week   Barriers to D/C:            Co-evaluation               End of Session    Activity Tolerance: Patient tolerated treatment well Patient left: in bed;with bed alarm set;with call bell/phone within reach   Time: 1010-1035 OT Time Calculation (min): 25 min Charges:  OT General Charges $OT Visit: 1 Procedure OT Evaluation $OT Eval Moderate Complexity: 1 Procedure G-Codes:    Olegario MessierElaine Derhonda Eastlick, MS, OTR/L 09/23/2016, 11:15 AM

## 2016-09-24 LAB — CBC
HEMATOCRIT: 30.4 % — AB (ref 40.0–52.0)
HEMOGLOBIN: 10.3 g/dL — AB (ref 13.0–18.0)
MCH: 28.8 pg (ref 26.0–34.0)
MCHC: 34 g/dL (ref 32.0–36.0)
MCV: 84.6 fL (ref 80.0–100.0)
Platelets: 185 10*3/uL (ref 150–440)
RBC: 3.59 MIL/uL — ABNORMAL LOW (ref 4.40–5.90)
RDW: 15.3 % — AB (ref 11.5–14.5)
WBC: 4.2 10*3/uL (ref 3.8–10.6)

## 2016-09-24 LAB — BASIC METABOLIC PANEL
Anion gap: 6 (ref 5–15)
BUN: 41 mg/dL — AB (ref 6–20)
CHLORIDE: 110 mmol/L (ref 101–111)
CO2: 25 mmol/L (ref 22–32)
CREATININE: 1.45 mg/dL — AB (ref 0.61–1.24)
Calcium: 8 mg/dL — ABNORMAL LOW (ref 8.9–10.3)
GFR calc Af Amer: 54 mL/min — ABNORMAL LOW (ref >60)
GFR, EST NON AFRICAN AMERICAN: 46 mL/min — AB (ref >60)
GLUCOSE: 102 mg/dL — AB (ref 65–99)
Potassium: 3.3 mmol/L — ABNORMAL LOW (ref 3.5–5.1)
SODIUM: 141 mmol/L (ref 135–145)

## 2016-09-24 MED ORDER — CIPROFLOXACIN HCL 500 MG PO TABS
500.0000 mg | ORAL_TABLET | Freq: Two times a day (BID) | ORAL | Status: DC
  Administered 2016-09-24: 500 mg via ORAL
  Filled 2016-09-24: qty 1

## 2016-09-24 MED ORDER — APIXABAN 5 MG PO TABS
5.0000 mg | ORAL_TABLET | Freq: Two times a day (BID) | ORAL | Status: DC
  Administered 2016-09-24: 12:00:00 5 mg via ORAL
  Filled 2016-09-24: qty 1

## 2016-09-24 MED ORDER — CIPROFLOXACIN HCL 500 MG PO TABS: 500.0000 mg | ORAL_TABLET | Freq: Two times a day (BID) | ORAL | 0 refills | Status: DC

## 2016-09-24 MED ORDER — TRAMADOL HCL 50 MG PO TABS: 50.0000 mg | ORAL_TABLET | Freq: Four times a day (QID) | ORAL | 0 refills | Status: DC | PRN

## 2016-09-24 MED ORDER — FUROSEMIDE 40 MG PO TABS: 20.0000 mg | ORAL_TABLET | Freq: Every day | ORAL | 0 refills | Status: DC

## 2016-09-24 NOTE — Discharge Summary (Signed)
[facility] - [place] at [facility]   PATIENT NAME: [name]    MR#:  [id number]  DATE OF BIRTH:  Nov 24, 1940  DATE OF ADMISSION:  09/22/2016 ADMITTING PHYSICIAN: Alford Highland, MD  DATE OF DISCHARGE: 09/24/16 PRIMARY CARE PHYSICIAN: Lorenda Ishihara, MD    ADMISSION DIAGNOSIS:  Right shoulder pain [M25.511] AKI (acute kidney injury) (HCC) [N17.9]  DISCHARGE DIAGNOSIS:  Active Problems:   Acute kidney injury (HCC)   Pressure injury of skin   SECONDARY DIAGNOSIS:   Past Medical History:  Diagnosis Date  . Bipolar 1 disorder (HCC)   . CHF (congestive heart failure) (HCC)   . COPD (chronic obstructive pulmonary disease) (HCC)   . DVT (deep venous thrombosis) (HCC)   . GERD (gastroesophageal reflux disease)   . High cholesterol   . Hypertension   . Schizophrenia (HCC)   . Seizure [facility])     HOSPITAL COURSE:  History of present illness [name]  is a 76 y.o. male presents to the ER after a fall out of bed with right shoulder pain. Patient states that he feels sick and is not feeling well at all. Patient is not the best historian but does answer some yes or no questions. On further questioning he does have some abdominal pain and does not urinate very much. In the ER he was found to have a creatinine of 5.4 and hospitalist services were contacted for evaluation. Since I've seen this patient in the past and I note that he has an enlarged prostate I asked the ER physician to get a bladder scan. On the bladder scan, a greater than 900 cc of urine and a Foley catheter was ordered.  Hospital course   #. Acute kidney injury secondary to urinary retention 2/2 BPH.  Renal function is better. Creatinine 5.40-3.26 --1.45 he is  urine cultures -  and multiple species, contaminated specimen , urology has recommended to discharge the patient with chest by mouth ciprofloxacin given the past medical history of VRE  Flomax to 0.8 mg daily continue  finasteride.   Foley catheter will have to be continued for a full week and follow-up with urology for a voiding trial as outpatient. This has happened to the patient in the past also.  #Gross hematuria secondary to problem #1 stretch injury of the bladder exacerbated by anticoagulation Continue finasteride and Flomax Renal function is getting better Patient needs outpatient urodynamics and/or chronic indwelling catheter Appreciate urology's recommendations Resuming  Eliquis, discussed with urology agreeable. Dr. Apolinar Junes will repeat CBC in 1 week during the follow-up visit   #. Right shoulder pain. X-ray right shoulder. Occupational therapy evaluation. No acute fractures. Degenerative changes  #. History of DVT -resume the eliquis as hematuria significantly improved and hemoglobin is stable   #. History of bipolar disorder and schizophrenia continue psychiatric medications  #. History of seizure continue phenobarbital  #. Essential hypertension- CONTINUE  Coreg and resume  Norvasc  #. Hyperlipidemia unspecified on pravastatin  #  Chronic lower extremity edema. Renal function is getting better resume Lasix at 20 mg   PT is recommending home health PT; discharge patient with Foley catheter and leg bag      DISCHARGE CONDITIONS:   stable  CONSULTS OBTAINED:  Treatment Team:  Vanna Scotland, MD Hildred Laser, MD   PROCEDURES none   DRUG ALLERGIES:   Allergies  Allergen Reactions  . Penicillin G Other (See Comments)    Convulsions. Has patient had a PCN reaction causing immediate  rash, facial/tongue/throat swelling, SOB or lightheadedness with hypotension: not sure Has patient had a PCN reaction causing severe rash involving mucus membranes or skin necrosis: not sure Has patient had a PCN reaction that required hospitalization: not sure Has patient had a PCN reaction occurring within the last 10 years: not sure If all of the above answers are "NO", then  may proceed with Cephalosporin use.    DISCHARGE MEDICATIONS:   Current Discharge Medication List    START taking these medications   Details  ciprofloxacin (CIPRO) 500 MG tablet Take 1 tablet (500 mg total) by mouth 2 (two) times daily. Qty: 14 tablet, Refills: 0      CONTINUE these medications which have CHANGED   Details  furosemide (LASIX) 40 MG tablet Take 0.5 tablets (20 mg total) by mouth daily. Qty: 30 tablet, Refills: 0    traMADol (ULTRAM) 50 MG tablet Take 1 tablet (50 mg total) by mouth every 6 (six) hours as needed for moderate pain or severe pain. Qty: 30 tablet, Refills: 0      CONTINUE these medications which have NOT CHANGED   Details  acetaminophen (TYLENOL) 325 MG tablet Take 2 tablets (650 mg total) by mouth every 6 (six) hours as needed. Qty: 90 tablet, Refills: 0    albuterol (VENTOLIN HFA) 108 (90 Base) MCG/ACT inhaler Inhale 2 puffs into the lungs every 6 (six) hours as needed for shortness of breath.    amLODipine (NORVASC) 2.5 MG tablet Take 1 tablet (2.5 mg total) by mouth daily. Qty: 30 tablet, Refills: 0    apixaban (ELIQUIS) 5 MG TABS tablet Take 1 tablet (5 mg total) by mouth 2 (two) times daily. Qty: 60 tablet, Refills: 6    ARIPiprazole (ABILIFY) 20 MG tablet Take 1 tablet (20 mg total) by mouth daily. Qty: 30 tablet, Refills: 0    aspirin 81 MG chewable tablet Chew 81 mg by mouth daily.    buPROPion (BUDEPRION XL) 300 MG 24 hr tablet Take 1 tablet (300 mg total) by mouth daily. Qty: 30 tablet, Refills: 0    carvedilol (COREG) 25 MG tablet Take 1 tablet (25 mg total) by mouth 2 (two) times daily. Qty: 60 tablet, Refills: 0    Cholecalciferol (VITAMIN D-1000 MAX ST) 1000 units tablet Take 1 tablet (1,000 Units total) by mouth daily. Qty: 30 tablet, Refills: 0    finasteride (PROSCAR) 5 MG tablet Take 1 tablet (5 mg total) by mouth daily. Qty: 30 tablet, Refills: 0    omeprazole (PRILOSEC) 20 MG capsule Take 2 capsules (40 mg total)  by mouth daily. Qty: 30 capsule, Refills: 0    PHENobarbital (LUMINAL) 64.8 MG tablet Take 2 tablets (129.6 mg total) by mouth every other day. Qty: 30 tablet, Refills: 0    polyethylene glycol (MIRALAX / GLYCOLAX) packet Take 17 g by mouth daily as needed for moderate constipation. Qty: 30 each, Refills: 0    potassium chloride (KLOR-CON) 20 MEQ packet Take 20 mEq by mouth daily.     pravastatin (PRAVACHOL) 40 MG tablet Take 1 tablet (40 mg total) by mouth daily. Qty: 30 tablet, Refills: 0    tamsulosin (FLOMAX) 0.4 MG CAPS capsule Take 1 capsule (0.4 mg total) by mouth daily. Qty: 90 capsule, Refills: 3         DISCHARGE INSTRUCTIONS:   Follow-up with primary care physician in a week Follow-up with urology Dr. Apolinar Junes in a week and urology to consider repeating CBC and BMP in a week during the  follow-up visit Continue Foley catheter and leg bag until seen and evaluated by urology  DIET:  Cardiac diet  DISCHARGE CONDITION:  Fair  ACTIVITY:  Activity as tolerated per HH PT  OXYGEN:  Home Oxygen: No.   Oxygen Delivery: room air  DISCHARGE LOCATION:  group home   If you experience worsening of your admission symptoms, develop shortness of breath, life threatening emergency, suicidal or homicidal thoughts you must seek medical attention immediately by calling 911 or calling your MD immediately  if symptoms less severe.  You Must read complete instructions/literature along with all the possible adverse reactions/side effects for all the Medicines you take and that have been prescribed to you. Take any new Medicines after you have completely understood and accpet all the possible adverse reactions/side effects.   Please note  You were cared for by a hospitalist during your hospital stay. If you have any questions about your discharge medications or the care you received while you were in the hospital after you are discharged, you can call the unit and asked to speak with  the hospitalist on call if the hospitalist that took care of you is not available. Once you are discharged, your primary care physician will handle any further medical issues. Please note that NO REFILLS for any discharge medications will be authorized once you are discharged, as it is imperative that you return to your primary care physician (or establish a relationship with a primary care physician if you do not have one) for your aftercare needs so that they can reassess your need for medications and monitor your lab values.     Today  Chief Complaint  Patient presents with  . Fall   Today patient is doing fine. Hematuria is significantly improved no new complaints. Planning to discharge patient to group home, discussed with urology agreeable. Recommended to discharge patient with Foley catheter  ROS:  CONSTITUTIONAL: Denies fevers, chills. Denies any fatigue, weakness.  EYES: Denies blurry vision, double vision, eye pain. EARS, NOSE, THROAT: Denies tinnitus, ear pain, hearing loss. RESPIRATORY: Denies cough, wheeze, shortness of breath.  CARDIOVASCULAR: Denies chest pain, palpitations, edema.  GASTROINTESTINAL: Denies nausea, vomiting, diarrhea, abdominal pain. Denies bright red blood per rectum. GENITOURINARY: Denies dysuria, hematuria. ENDOCRINE: Denies nocturia or thyroid problems. HEMATOLOGIC AND LYMPHATIC: Denies easy bruising or bleeding. SKIN: Denies rash or lesion. MUSCULOSKELETAL: Denies pain in neck, back, shoulder, knees, hips or arthritic symptoms.  NEUROLOGIC: Denies paralysis, paresthesias.  PSYCHIATRIC: Denies anxiety or depressive symptoms.   VITAL SIGNS:  Blood pressure (!) 152/85, pulse 78, temperature 98.4 F (36.9 C), temperature source Oral, resp. rate (!) 23, height 5\' 10"  (1.778 m), weight 114.8 kg (253 lb), SpO2 98 %.  I/O:    Intake/Output Summary (Last 24 hours) at 09/24/16 1304 Last data filed at 09/24/16 0757  Gross per 24 hour  Intake           1059.33 ml  Output             4650 ml  Net         -3590.67 ml    PHYSICAL EXAMINATION:  GENERAL:  76 y.o.-year-old patient lying in the bed with no acute distress.  EYES: Pupils equal, round, reactive to light and accommodation. No scleral icterus. Extraocular muscles intact.  HEENT: Head atraumatic, normocephalic. Oropharynx and nasopharynx clear.  NECK:  Supple, no jugular venous distention. No thyroid enlargement, no tenderness.  LUNGS: Normal breath sounds bilaterally, no wheezing, rales,rhonchi or crepitation. No use of  accessory muscles of respiration.  CARDIOVASCULAR: S1, S2 normal. No murmurs, rubs, or gallops.  ABDOMEN: Soft, non-tender, non-distended. Bowel sounds present. No organomegaly or mass.  EXTREMITIES: No pedal edema, cyanosis, or clubbing.  NEUROLOGIC: Cranial nerves II through XII are intact. Muscle strength 5/5 in all extremities. Sensation intact. Gait not checked.  PSYCHIATRIC: The patient is alert and oriented x 3.  SKIN: No obvious rash, lesion, or ulcer.   DATA REVIEW:   CBC  Recent Labs Lab 09/24/16 0802  WBC 4.2  HGB 10.3*  HCT 30.4*  PLT 185    Chemistries   Recent Labs Lab 09/22/16 1024  09/24/16 0802  NA 134*  < > 141  K 4.5  < > 3.3*  CL 101  < > 110  CO2 21*  < > 25  GLUCOSE 107*  < > 102*  BUN 96*  < > 41*  CREATININE 5.40*  < > 1.45*  CALCIUM 8.6*  < > 8.0*  AST 37  --   --   ALT 14*  --   --   ALKPHOS 70  --   --   BILITOT 0.8  --   --   < > = values in this interval not displayed.  Cardiac Enzymes  Recent Labs Lab 09/22/16 1024  TROPONINI <0.03    Microbiology Results  Results for orders placed or performed during the hospital encounter of 09/22/16  Urine culture     Status: Abnormal   Collection Time: 09/22/16 10:24 AM  Result Value Ref Range Status   Specimen Description URINE, RANDOM  Final   Special Requests NONE  Final   Culture MULTIPLE SPECIES PRESENT, SUGGEST RECOLLECTION (A)  Final   Report Status  09/23/2016 FINAL  Final  MRSA PCR Screening     Status: None   Collection Time: 09/22/16  6:01 PM  Result Value Ref Range Status   MRSA by PCR NEGATIVE NEGATIVE Final    Comment:        The GeneXpert MRSA Assay (FDA approved for NASAL specimens only), is one component of a comprehensive MRSA colonization surveillance program. It is not intended to diagnose MRSA infection nor to guide or monitor treatment for MRSA infections.     RADIOLOGY:  Dg Shoulder Right  Result Date: 09/22/2016 CLINICAL DATA:  Fall with right shoulder pain, initial encounter EXAM: RIGHT SHOULDER - 2+ VIEW COMPARISON:  None. FINDINGS: Degenerative changes of the acromioclavicular joint are seen. Glenohumeral degenerative changes are noted as well. No acute fracture or dislocation is noted. No soft tissue changes are seen. IMPRESSION: Degenerative change without acute abnormality. Electronically Signed   By: Alcide CleverMark  Lukens M.D.   On: 09/22/2016 13:07   Ct Head Wo Contrast  Result Date: 09/22/2016 CLINICAL DATA:  Recent fall EXAM: CT HEAD WITHOUT CONTRAST CT CERVICAL SPINE WITHOUT CONTRAST TECHNIQUE: Multidetector CT imaging of the head and cervical spine was performed following the standard protocol without intravenous contrast. Multiplanar CT image reconstructions of the cervical spine were also generated. COMPARISON:  05/21/2016 FINDINGS: CT HEAD FINDINGS Brain: Atrophic and chronic white matter ischemic changes are again noted. 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 No findings to suggest acute hemorrhage, acute infarction or space-occupying mass lesion are noted. Vascular: No hyperdense vessel or unexpected calcification. Skull: Normal. Negative for fracture or focal lesion. Sinuses/Orbits: No acute finding. Other: None. CT CERVICAL SPINE FINDINGS Alignment: Within normal limits. Skull base and vertebrae: 7 cervical segments are well  visualized. Vertebral body height is well  maintained. No acute fracture or acute facet abnormality is noted. Multilevel osteophytic changes are seen. Facet hypertrophic changes are noted as well with bilateral neural foraminal narrowing. Soft tissues and spinal canal: No soft tissue abnormality is noted. Disc levels:  Disc space narrowing is noted at C5-6 and C6-7. Upper chest: Scarring is noted in the left apex medially. IMPRESSION: CT of the head: Chronic atrophic and ischemic changes without acute abnormality. CT of the cervical spine: Multilevel degenerative changes without acute abnormality. Electronically Signed   By: Alcide Clever M.D.   On: 09/22/2016 10:54   Ct Cervical Spine Wo Contrast  Result Date: 09/22/2016 CLINICAL DATA:  Recent fall EXAM: CT HEAD WITHOUT CONTRAST CT CERVICAL SPINE WITHOUT CONTRAST TECHNIQUE: Multidetector CT imaging of the head and cervical spine was performed following the standard protocol without intravenous contrast. Multiplanar CT image reconstructions of the cervical spine were also generated. COMPARISON:  05/21/2016 FINDINGS: CT HEAD FINDINGS Brain: Atrophic and chronic white matter ischemic changes are again noted. 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 No findings to suggest acute hemorrhage, acute infarction or space-occupying mass lesion are noted. Vascular: No hyperdense vessel or unexpected calcification. Skull: Normal. Negative for fracture or focal lesion. Sinuses/Orbits: No acute finding. Other: None. CT CERVICAL SPINE FINDINGS Alignment: Within normal limits. Skull base and vertebrae: 7 cervical segments are well visualized. Vertebral body height is well maintained. No acute fracture or acute facet abnormality is noted. Multilevel osteophytic changes are seen. Facet hypertrophic changes are noted as well with bilateral neural foraminal narrowing. Soft tissues and spinal canal: No soft tissue abnormality is noted. Disc levels:  Disc space narrowing is noted at C5-6  and C6-7. Upper chest: Scarring is noted in the left apex medially. IMPRESSION: CT of the head: Chronic atrophic and ischemic changes without acute abnormality. CT of the cervical spine: Multilevel degenerative changes without acute abnormality. Electronically Signed   By: Alcide Clever M.D.   On: 09/22/2016 10:54   Dg Chest Portable 1 View  Result Date: 09/22/2016 CLINICAL DATA:  Larey Seat out of bed this morning, hit right shoulder and head EXAM: PORTABLE CHEST 1 VIEW COMPARISON:  05/21/2016 FINDINGS: Cardiomediastinal silhouette is stable. No infiltrate or pleural effusion. No pulmonary edema. Degenerative changes bilateral shoulders. IMPRESSION: No active disease. Electronically Signed   By: Natasha Mead M.D.   On: 09/22/2016 10:26    EKG:   Orders placed or performed during the hospital encounter of 09/22/16  . ED EKG  . ED EKG  . EKG 12-Lead  . EKG 12-Lead      Management plans discussed with the patient, family and they are in agreement.  CODE STATUS:     Code Status Orders        Start     Ordered   09/22/16 1223  Full code  Continuous     09/22/16 1223    Code Status History    Date Active Date Inactive Code Status Order ID Comments User Context   05/24/2016  2:53 PM 05/28/2016  6:19 PM DNR [id number]  Alford Highland, MD Inpatient   05/21/2016  7:36 PM 05/24/2016  2:53 PM Full Code [id number]  Alford Highland, MD ED   04/23/2016  4:22 PM 04/24/2016 12:58 PM Full Code [id number]  Auburn Bilberry, MD ED   02/21/2015  3:37 PM 02/24/2015  4:42 PM Full Code [id number]  Shaune Pollack, MD Inpatient  TOTAL TIME TAKING CARE OF THIS PATIENT: 45  minutes.   Note: This dictation was prepared with Dragon dictation along with smaller phrase technology. Any transcriptional errors that result from this process are unintentional.   @[facility] @  on 09/24/2016 at 1:04 PM  Between 7am to 6pm - Pager - [phone]  After 6pm go to [email or web address] - password [email or web address] [facility]  [facility]   Office  [phone]  CC: Primary care physician; Lorenda Ishihara, MD

## 2016-09-24 NOTE — [place] FL2 (Signed)
[place] MEDICAID FL2 LEVEL OF CARE SCREENING TOOL     IDENTIFICATION  Patient Name: [name] Birthdate: [birth year] Sex: male Admission Date (Current Location): 09/22/2016  [place]  [name] [id number] [facility] Facility and Address:  [facility], [address], [place], [place] [address]      Provider Number: [id number]  Attending Physician Name and Address:  Ramonita LabAruna Divya Munshi, MD  Relative Name and Phone Number:  Rolla EtienneLove,Queen E Other [phone]     Current Level of Care: Hospital Recommended Level of Care: Assisted Living Facility [name]) Prior Approval Number:    Date Approved/Denied:   PASRR Number: [id number]  Discharge Plan: Other (Comment) [name])    Current Diagnoses: Patient Active Problem List   Diagnosis Date Noted  . Pressure injury of skin 09/23/2016  . Back pain 05/28/2016  . Sepsis (HCC) 05/27/2016  . UTI (urinary tract infection) 05/27/2016  . Escherichia coli (E. coli) infection 05/27/2016  . DVT of lower extremity, bilateral (HCC) 05/27/2016  . 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 BPH (benign prostatic hyperplasia) 05/27/2016  . Acute on chronic renal failure (HCC) 05/27/2016  . Bipolar disorder (HCC) 05/27/2016  . Generalized weakness 05/27/2016  . 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 C. difficile colitis 05/27/2016  . Acute kidney injury (HCC) 05/21/2016  . UTI (lower urinary tract infection) 04/23/2016  . 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 Pneumonia 02/21/2015  . ARF (acute renal failure) (HCC) 02/21/2015  . Hypokalemia 02/21/2015  . Leukocytosis 02/21/2015  . Venous insufficiency of both lower extremities 12/04/2014  . Benign essential hypertension 11/25/2014  . Bilateral carotid artery stenosis 11/15/2014  . Bilateral leg edema 11/15/2014  . COPD (chronic obstructive pulmonary disease) (HCC) 11/15/2014  .  Gastroesophageal reflux disease 11/15/2014  . Mild aortic valve stenosis 11/15/2014  . Mixed hyperlipidemia 11/15/2014  . Obesity 11/15/2014  . Seizure disorder (HCC) 11/15/2014    Orientation RESPIRATION BLADDER Height & Weight     Self, Place  Normal Indwelling catheter Weight: 253 lb (114.8 kg) Height:  5\' 10"  (177.8 cm)  BEHAVIORAL SYMPTOMS/MOOD NEUROLOGICAL BOWEL NUTRITION STATUS    Convulsions/Seizures Continent Diet (2g sodium diet)  AMBULATORY STATUS COMMUNICATION OF NEEDS Skin   Limited Assist Verbally PU Stage and Appropriate Care   PU Stage 2 Dressing:  (Every three days)                   Personal Care Assistance Level of Assistance  Bathing, Dressing, Feeding Bathing Assistance: Limited assistance Feeding assistance: Independent Dressing Assistance: Limited assistance     Functional Limitations Info  Sight, Hearing, Speech Sight Info: Adequate Hearing Info: Adequate Speech Info: Adequate    SPECIAL CARE FACTORS FREQUENCY  PT (By licensed PT), OT (By licensed OT)     PT Frequency: Home Health PT 2x a week OT Frequency: Home Health OT 2x a week            Contractures Contractures Info: Not present    Additional Factors Info  Code Status, Allergies, Psychotropic Code Status Info: Full Code Allergies Info: PENICILLIN G  Psychotropic Info: ARIPiprazole (ABILIFY) tablet 20 mg and buPROPion (WELLBUTRIN XL) 24 hr tablet 300 mg          Current Medications (09/24/2016):  This is the current hospital active medication list Current Facility-Administered Medications  Medication Dose Route Frequency Provider Last Rate Last Dose  . 0.9 %  sodium chloride infusion  Intravenous Continuous Alford Highland, MD 40 mL/hr at 09/24/16 [phone]    . acetaminophen (TYLENOL) tablet 650 mg  650 mg Oral Q6H PRN Alford Highland, MD       Or  . acetaminophen (TYLENOL) suppository 650 mg  650 mg Rectal Q6H PRN Alford Highland, MD      . apixaban (ELIQUIS) tablet 5 mg  5  mg Oral BID Ramonita Lab, MD      . ARIPiprazole (ABILIFY) tablet 20 mg  20 mg Oral Daily Alford Highland, MD   20 mg at 09/24/16 0953  . aspirin chewable tablet 81 mg  81 mg Oral Daily Alford Highland, MD   81 mg at 09/24/16 0954  . buPROPion (WELLBUTRIN XL) 24 hr tablet 300 mg  300 mg Oral Daily Alford Highland, MD   300 mg at 09/24/16 0954  . carvedilol (COREG) tablet 12.5 mg  12.5 mg Oral BID Alford Highland, MD   12.5 mg at 09/24/16 0954  . Cholecalciferol 1,000 Units  1,000 Units Oral Daily Alford Highland, MD   1,000 Units at 09/23/16 1053  . ciprofloxacin (CIPRO) tablet 500 mg  500 mg Oral BID Ramonita Lab, MD      . finasteride (PROSCAR) tablet 5 mg  5 mg Oral Daily Alford Highland, MD   5 mg at 09/24/16 0954  . pantoprazole (PROTONIX) EC tablet 40 mg  40 mg Oral Daily Alford Highland, MD   40 mg at 09/24/16 0954  . PHENobarbital (LUMINAL) tablet 129.6 mg  129.6 mg Oral QODAY Richard Renae Gloss, MD   129.6 mg at 09/24/16 1126  . polyethylene glycol (MIRALAX / GLYCOLAX) packet 17 g  17 g Oral Daily PRN Alford Highland, MD      . pravastatin (PRAVACHOL) tablet 40 mg  40 mg Oral q1800 Alford Highland, MD   40 mg at 09/23/16 1740  . tamsulosin (FLOMAX) capsule 0.8 mg  0.8 mg Oral QPC supper Alford Highland, MD   0.8 mg at 09/23/16 1740  . traMADol (ULTRAM) tablet 50 mg  50 mg Oral Q12H PRN Alford Highland, MD   50 mg at 09/23/16 2117     Discharge Medications: Please see discharge summary for a list of discharge medications.  Relevant Imaging Results:  Relevant Lab Results:   Additional Information SSN [id number]  Darleene Cleaver, [place]

## 2016-09-24 NOTE — Progress Notes (Signed)
Pharmacy Antibiotic Note  [name] is a 76 y.o. male admitted on 09/22/2016 with UTI.  Pharmacy has been consulted for Cipro dosing.  Plan: The dose of Cipro will be adjusted to 500mg  PO bid based on renal function.  Patient meets criteria for conversion to oral therapy as well.  Height: 5\' 10"  (177.8 cm) Weight: 253 lb (114.8 kg) IBW/kg (Calculated) : 73  Temp (24hrs), Avg:98.2 F (36.8 C), Min:97.6 F (36.4 C), Max:98.5 F (36.9 C)   Recent Labs Lab 09/22/16 1010 09/22/16 1024 09/23/16 0431 09/24/16 0802  WBC  --  6.0 4.6 4.2  CREATININE  --  5.40* 3.26* 1.45*  LATICACIDVEN 0.9  --   --   --     Estimated Creatinine Clearance: 57.6 mL/min (by C-G formula based on SCr of 1.45 mg/dL (H)).    Allergies  Allergen Reactions  . Penicillin G Other (See Comments)    Convulsions. Has patient had a PCN reaction causing immediate rash, facial/tongue/throat swelling, SOB or lightheadedness with hypotension: not sure Has patient had a PCN reaction causing severe rash involving mucus membranes or skin necrosis: not sure Has patient had a PCN reaction that required hospitalization: not sure Has patient had a PCN reaction occurring within the last 10 years: not sure If all of the above answers are "NO", then may proceed with Cephalosporin use.    Antimicrobials this admission: cipro 2/14 >>   Thank you for allowing pharmacy to be a part of this patient's care.  [name], PharmD, BCPS 09/24/2016 10:19 AM

## 2016-09-24 NOTE — Clinical Social Work Note (Signed)
Patient to be d/c'ed today to [facility].  Patient and family agreeable to plans will transport via ems patient is going to ALF no need to call report.  Windell MouldingEric Dorathea Faerber, MSW, Theresia MajorsLCSWA [phone]

## 2016-09-24 NOTE — Progress Notes (Signed)
Pt discharged [facility] to [facility]. Pt discharged on room air with a foley catheter. No distress noted. Otilio JeffersonMadelyn S Fenton, RN

## 2016-09-24 NOTE — Care Management Note (Addendum)
Case Management Note  Patient Details  Name: [name] MRN: [id number] Date of Birth: September 01, 1940  Subjective/Objective:    Requested attending neter resumption of care orders for [facility]. [facility] following for nursing, PT and OT. Notified Ardyth GalQueen Love, patient guardian at [facility] of resumption.                Action/Plan: Barbara CowerJason with [facility] notified of discharge.   Expected Discharge Date:  09/24/16               Expected Discharge Plan:  Home w Home Health Services  In-House Referral:     Discharge planning Services     Post Acute Care Choice:  Home Health, Resumption of Svcs/PTA [facility]  DME Arranged:    DME Agency:     HH Arranged:  RN, PT, OT HH Agency:  [facility]  Status of Service:  Completed, signed off  If discussed at Long Length of Stay Meetings, dates discussed:    Additional Comments:  Marily MemosLisa M Laddie Naeem, RN 09/24/2016, 11:22 AM

## 2016-09-24 NOTE — Discharge Instructions (Signed)
Follow-up with primary care physician in a week Follow-up with urology Dr. Apolinar JunesBrandon in a week and urology to consider repeating CBC and BMP in a week during the follow-up visit Continue Foley catheter and leg bag until seen and evaluated by urology

## 2016-09-24 NOTE — Progress Notes (Signed)
ANTICOAGULATION CONSULT NOTE - Initial Consult  Pharmacy Consult for Apixaban Indication: h/o DVT  Allergies  Allergen Reactions  . Penicillin G Other (See Comments)    Convulsions. Has patient had a PCN reaction causing immediate rash, facial/tongue/throat swelling, SOB or lightheadedness with hypotension: not sure Has patient had a PCN reaction causing severe rash involving mucus membranes or skin necrosis: not sure Has patient had a PCN reaction that required hospitalization: not sure Has patient had a PCN reaction occurring within the last 10 years: not sure If all of the above answers are "NO", then may proceed with Cephalosporin use.    Assessment: Patient admitted for acute kidney injury. Pharmacy consulted to restart Apixaban. Patient was taking Apixaban 5mg  bid prior to admission for DVT. Apixaban was held during admission due to hematuria that has now resolved.  Plan:  Will resume home dose of Apixaban 5mg  bid.  Clovia CuffLisa Marwin Primmer, PharmD, BCPS 09/24/2016 11:00 AM

## 2016-09-24 NOTE — Clinical Social Work Note (Signed)
Clinical Social Work Assessment  Patient Details  Name: [name] MRN: [id number] Date of Birth: November 28, 1940  Date of referral:  09/24/16               Reason for consult:  Facility Placement                Permission sought to share information with:  Facility Medical sales representativeContact Representative, Family Supports Permission granted to share information::  Yes, Verbal Permission Granted  Name::     Rolla EtienneLove,Queen E Other [phone]   Agency::  [facility]  Relationship::     Contact Information:     Housing/Transportation Living arrangements for the past 2 months:  [facility] Source of Information:  Medical Team Patient Interpreter Needed:  None Criminal Activity/Legal Involvement Pertinent to Current Situation/Hospitalization:  No - Comment as needed Significant Relationships:  Other(Comment) (Caregivers) Lives with:  Facility Resident Do you feel safe going back to the place where you live?  Yes Need for family participation in patient care:  Yes (Comment)  Care giving concerns:  Patient plans to return back to ALF   Social Worker assessment / plan:  Patient has some confusion CSW completed assessment by reviewing patient's chart and speaking with ALF caregiver.  Patient has been at ALF for several years, the ALF states they help take care of him and do not have any issues with him returning back home.  Patient's ALF is able to meet his needs with medications and proper care.  PT is recommending home health, which patient was already receiving with [facility].  Patient did not seem to express any issues or concerns about returning to ALF.  Employment status:  Retired Health and safety inspectornsurance information:  Armed forces operational officerMedicare, [place] Recommendations:  Home with Home Health Information / Referral to community resources:     Patient/Family's Response to care:  Patient and caregivers are in agreement to having patient return back to ALF.  Patient/Family's Understanding of  and Emotional Response to Diagnosis, Current Treatment, and Prognosis:  Patient not aware of current prognosis or treatment plan.  Caregivers are aware of plan and do not have any concerns.  Emotional Assessment Appearance:  Appears stated age Attitude/Demeanor/Rapport:    Affect (typically observed):  Appropriate, Calm Orientation:  Oriented to Self, Oriented to Place Alcohol / Substance use:  Not Applicable Psych involvement (Current and /or in the community):  No (Comment)  Discharge Needs  Concerns to be addressed:    Readmission within the last 30 days:  No Current discharge risk:  None Barriers to Discharge:  No Barriers Identified   Darleene Cleavernterhaus, Reznor Ferrando R, LCSWA 09/24/2016, 1:44 PM

## 2016-09-24 NOTE — Care Management Important Message (Signed)
Important Message  Patient Details  Name: [name] DarbyJerry L Siebel MRN: [id number] Date of Birth: January 15, 1941   Medicare Important Message Given:  Yes    Marily MemosLisa M Deja Kaigler, RN 09/24/2016, 11:57 AM

## 2016-10-17 NOTE — Progress Notes (Signed)
10/18/2016 9:19 AM   [name] Aug 30, 1940 [id number]  Referring provider: Lorenda Ishihara, MD [address] [place], [place] [address]  Chief Complaint  Patient presents with  . Benign Prostatic Hypertrophy    27month follow up  . Urinary Retention    HPI: Patient is a 76 year old Caucasian male who presents today for a one month follow up for urinary retention.    Background history Patient presented to [place] at the request of [facility] ED for Foley catheter removal.   Patient is a poor historian and the woman with him today, Diane, is just involved with transporting the patient and is not involved with the patients clinically.  Reviewing hospital records, he was having difficulty with voiding in 04/2016 and finasteride was added at that time.  He was on terazosin at the time of admission.  He was also complaining of constipation and was started on MiraLax.   A Foley was placed on 05/21/2016 and 3 liters of urine was drained from the bladder.  He was changed to tamsulosin and had terazosin discontinued.  Finasteride is continued as well.  He was also found to be C. Diff positive.  Patient was admitted to the hospital for UTI, sepsis, ARF and DVT.  He was discharged with the Foley.   He is unable to fill I PSS score or comment regarding his urination ability prior to the Foley catheter placement.  On 06/16/2016, his Foley was removed at that visit for a voiding trial.  He presented back to the office that afternoon in retention and the Foley was replaced.  The home health care team exchanged the Foley on 07/28/2016.    Foley was then removed on 09/13/2016.  One week later, he was seen in the ED for an unwitnessed fall.  He was found to have 900 cc of urine in his bladder. A Foley catheter was replaced and patient subsequently developed gross hematuria. He was admitted to the hospital and anticoagulation therapy was with held.  The hematuria cleared.    Today, he is in  need of a Foley catheter exchange.  He is continuing to take the tamsulosin and finasteride.  He has not had gross hematuria or Foley discomfort.   He has not had recent fevers, chills, nausea or vomiting.    PMH significant for CHF, COPD, DVT, seizure disorder, schizophrenia and Bipolar 1 disorder.    PMH: Past Medical History:  Diagnosis Date  . Bipolar 1 disorder (HCC)   . CHF (congestive heart failure) (HCC)   . COPD (chronic obstructive pulmonary disease) (HCC)   . DVT (deep venous thrombosis) (HCC)   . GERD (gastroesophageal reflux disease)   . High cholesterol   . Hypertension   . Schizophrenia (HCC)   . Seizure [facility])     Surgical History: Past Surgical History:  Procedure Laterality Date  . NO PAST SURGERIES      Home Medications:  Allergies as of 10/18/2016      Reactions   Penicillin G Other (See Comments)   Convulsions. Has patient had a PCN reaction causing immediate rash, facial/tongue/throat swelling, SOB or lightheadedness with hypotension: not sure Has patient had a PCN reaction causing severe rash involving mucus membranes or skin necrosis: not sure Has patient had a PCN reaction that required hospitalization: not sure Has patient had a PCN reaction occurring within the last 10 years: not sure If all of the above answers are "NO", then may proceed with Cephalosporin  use.      Medication List       Accurate as of 10/18/16  9:19 AM. Always use your most recent med list.          acetaminophen 325 MG tablet Commonly known as:  TYLENOL Take 2 tablets (650 mg total) by mouth every 6 (six) hours as needed.   amLODipine 2.5 MG tablet Commonly known as:  NORVASC Take 1 tablet (2.5 mg total) by mouth daily.   apixaban 5 MG Tabs tablet Commonly known as:  ELIQUIS Take 1 tablet (5 mg total) by mouth 2 (two) times daily.   ARIPiprazole 20 MG tablet Commonly known as:  ABILIFY Take 1 tablet (20 mg total) by mouth daily.   buPROPion 300 MG 24 hr  tablet Commonly known as:  BUDEPRION XL Take 1 tablet (300 mg total) by mouth daily.   carvedilol 25 MG tablet Commonly known as:  COREG Take 1 tablet (25 mg total) by mouth 2 (two) times daily.   Cholecalciferol 1000 units tablet Commonly known as:  VITAMIN D-1000 MAX ST Take 1 tablet (1,000 Units total) by mouth daily.   finasteride 5 MG tablet Commonly known as:  PROSCAR Take 1 tablet (5 mg total) by mouth daily.   omeprazole 20 MG capsule Commonly known as:  PRILOSEC Take 2 capsules (40 mg total) by mouth daily.   PHENobarbital 64.8 MG tablet Commonly known as:  LUMINAL Take 2 tablets (129.6 mg total) by mouth every other day.   polyethylene glycol packet Commonly known as:  MIRALAX / GLYCOLAX Take 17 g by mouth daily as needed for moderate constipation.   potassium chloride 20 MEQ packet Commonly known as:  KLOR-CON Take 20 mEq by mouth daily.   pravastatin 40 MG tablet Commonly known as:  PRAVACHOL Take 1 tablet (40 mg total) by mouth daily.   tamsulosin 0.4 MG Caps capsule Commonly known as:  FLOMAX Take 1 capsule (0.4 mg total) by mouth daily.   traMADol 50 MG tablet Commonly known as:  ULTRAM Take 1 tablet (50 mg total) by mouth every 6 (six) hours as needed for moderate pain or severe pain.   VENTOLIN HFA 108 (90 Base) MCG/ACT inhaler Generic drug:  albuterol Inhale 2 puffs into the lungs every 6 (six) hours as needed for shortness of breath.       Allergies:  Allergies  Allergen Reactions  . Penicillin G Other (See Comments)    Convulsions. Has patient had a PCN reaction causing immediate rash, facial/tongue/throat swelling, SOB or lightheadedness with hypotension: not sure Has patient had a PCN reaction causing severe rash involving mucus membranes or skin necrosis: not sure Has patient had a PCN reaction that required hospitalization: not sure Has patient had a PCN reaction occurring within the last 10 years: not sure If all of the above answers  are "NO", then may proceed with Cephalosporin use.    Family History: Family History  Problem Relation Age of Onset  . Healthy Mother   . Prostate cancer Neg Hx   . Kidney disease Neg Hx   . Kidney cancer Neg Hx   . Bladder Cancer Neg Hx     Social History:  reports that he has never smoked. He has never used smokeless tobacco. He reports that he does not drink alcohol or use drugs.  ROS: UROLOGY Frequent Urination?: No Hard to postpone urination?: No Burning/pain with urination?: No Get up at night to urinate?: No Leakage of urine?: No Urine stream starts  and stops?: No Trouble starting stream?: No Do you have to strain to urinate?: No Blood in urine?: No Urinary tract infection?: No Sexually transmitted disease?: No Injury to kidneys or bladder?: No Painful intercourse?: No Weak stream?: No Erection problems?: No Penile pain?: No  Gastrointestinal Nausea?: No Vomiting?: No Indigestion/heartburn?: No Diarrhea?: No Constipation?: No  Constitutional Fever: No Night sweats?: No Weight loss?: No Fatigue?: No  Skin Skin rash/lesions?: No Itching?: No  Eyes Blurred vision?: No Double vision?: No  Ears/Nose/Throat Sore throat?: No Sinus problems?: No  Hematologic/Lymphatic Swollen glands?: No Easy bruising?: No  Cardiovascular Leg swelling?: No Chest pain?: No  Respiratory Cough?: No Shortness of breath?: No  Endocrine Excessive thirst?: No  Musculoskeletal Back pain?: No Joint pain?: No  Neurological Headaches?: No Dizziness?: No  Psychologic Depression?: No Anxiety?: No  Physical Exam: BP 120/85   Pulse 73   Ht 5\' 10"  (1.778 m)   Wt 253 lb (114.8 kg)   BMI 36.30 kg/m   Constitutional: Well nourished. Alert. No acute distress.  Smells of vomit.  Vomit seen in wheel chair when patient stood for exam.   HEENT: [place] AT, moist mucus membranes. Trachea midline, no masses. Cardiovascular: No clubbing, cyanosis, or  edema. Respiratory: Normal respiratory effort, no increased work of breathing. GI: Abdomen is soft, non tender, non distended, no abdominal masses. Liver and spleen not palpable.  No hernias appreciated.  Stool sample for occult testing is not indicated.   Skin: No rashes, bruises or suspicious lesions. Lymph: No cervical or inguinal adenopathy.  Severe pedal edema with weeping sores. Neurologic: Grossly intact, no focal deficits, moving all 4 extremities. Psychiatric: Normal mood and affect.  Laboratory Data: Lab Results  Component Value Date   WBC 4.2 09/24/2016   HGB 10.3 (L) 09/24/2016   HCT 30.4 (L) 09/24/2016   MCV 84.6 09/24/2016   PLT 185 09/24/2016    Lab Results  Component Value Date   CREATININE 1.45 (H) 09/24/2016    Lab Results  Component Value Date   TSH 1.772 04/23/2016     Lab Results  Component Value Date   AST 37 09/22/2016   Lab Results  Component Value Date   ALT 14 (L) 09/22/2016     Pertinent Imaging: CLINICAL DATA:  Central abdominal pain for 1 week. Productive cough since Sunday. Rolled out of bed on Sunday. Bruising of the right side of the face.  EXAM: CT ABDOMEN AND PELVIS WITHOUT CONTRAST  TECHNIQUE: Multidetector CT imaging of the abdomen and pelvis was performed following the standard protocol without IV contrast.  COMPARISON:  CT of the abdomen and pelvis on 10/02/2012  FINDINGS: Lower chest: There is dense opacity in the medial right lower lobe, somewhat rounded and masslike in appearance, measuring 4.6 centimeters but only partially imaged. Minimal atelectasis identified in the left lower lobe.  Upper abdomen: No focal abnormality identified within the liver, spleen, pancreas, or adrenal glands. A 4.5 centimeter upper pole cyst is identified in the right kidney. There is no hydronephrosis. Intrarenal calculi are identified in the right kidney. No ureteral obstruction. Largest calculi in the lower pole region on the  right measure 1.3 centimeters.  Gastrointestinal tract: The stomach, small bowel loops have a normal appearance. The appendix is well seen and has a normal appearance. Colonic loops are normal in appearance.  Pelvis: Urinary bladder, prostate gland, and seminal vesicles have a normal appearance. No free pelvic fluid.  Retroperitoneum: No retroperitoneal or mesenteric adenopathy. There is atherosclerosis of the  abdominal aorta. No aneurysm.  Abdominal wall: Unremarkable.  Osseous structures: Significant degenerative changes in the lower thoracic and lumbar spine. No suspicious lytic or blastic lesions are identified.  IMPRESSION: 1. Dense opacity in the medial aspect of the right lower lobe, possibly representing infectious process. However given the rounded appearance a mass is not excluded and follow-up is recommended. Given location of this opacity it is difficult to image radiographically and follow-up CT of the chest is recommended in 3-4 weeks following trial of antibiotic therapy to ensure resolution and exclude underlying malignancy. 2. Atherosclerosis of the abdominal aorta. 3. 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   Electronically Signed   By: Norva PavlovElizabeth  Brown M.D.   On: 02/21/2015 13:15  Assessment & Plan:    1. Urinary retention:     - orders placed for home health to exchange the Foley  -follow-up in three months for  exam.  2. BPH with LUTS  - Continue conservative management, avoiding bladder irritants and timed voiding's  - Continue tamsulosin 0.4 mg daily and finasteride 5 mg daily  - RTC in 3 months for exam   3. History of hematuria  - check CBC today     Return in about 3 months (around 01/18/2017) for exam.  These notes generated with voice recognition software. I apologize for typographical errors.  Michiel CowboySHANNON Jeslynn Hollander, PA-C  [facility] [address], [address] [place], [place] [address] [phone]  [phone]

## 2016-10-18 ENCOUNTER — Encounter: Payer: Self-pay | Admitting: Urology

## 2016-10-18 ENCOUNTER — Ambulatory Visit (INDEPENDENT_AMBULATORY_CARE_PROVIDER_SITE_OTHER): Payer: Medicare Other | Admitting: Urology

## 2016-10-18 VITALS — BP 120/85 | HR 73 | Ht 70.0 in | Wt 253.0 lb

## 2016-10-18 DIAGNOSIS — Z87448 Personal history of other diseases of urinary system: Secondary | ICD-10-CM | POA: Diagnosis not present

## 2016-10-18 DIAGNOSIS — N401 Enlarged prostate with lower urinary tract symptoms: Secondary | ICD-10-CM

## 2016-10-18 DIAGNOSIS — N138 Other obstructive and reflux uropathy: Secondary | ICD-10-CM | POA: Diagnosis not present

## 2016-10-18 DIAGNOSIS — R339 Retention of urine, unspecified: Secondary | ICD-10-CM | POA: Diagnosis not present

## 2016-10-19 ENCOUNTER — Telehealth: Payer: Self-pay

## 2016-10-19 LAB — CBC WITH DIFFERENTIAL/PLATELET
BASOS ABS: 0 10*3/uL (ref 0.0–0.2)
Basos: 0 %
EOS (ABSOLUTE): 0 10*3/uL (ref 0.0–0.4)
Eos: 1 %
Hematocrit: 34.7 % — ABNORMAL LOW (ref 37.5–51.0)
Hemoglobin: 11.4 g/dL — ABNORMAL LOW (ref 13.0–17.7)
Immature Grans (Abs): 0 10*3/uL (ref 0.0–0.1)
Immature Granulocytes: 0 %
LYMPHS ABS: 1.5 10*3/uL (ref 0.7–3.1)
LYMPHS: 32 %
MCH: 27.9 pg (ref 26.6–33.0)
MCHC: 32.9 g/dL (ref 31.5–35.7)
MCV: 85 fL (ref 79–97)
MONOS ABS: 0.4 10*3/uL (ref 0.1–0.9)
Monocytes: 8 %
NEUTROS ABS: 2.8 10*3/uL (ref 1.4–7.0)
Neutrophils: 59 %
Platelets: 173 10*3/uL (ref 150–379)
RBC: 4.09 x10E6/uL — ABNORMAL LOW (ref 4.14–5.80)
RDW: 15.2 % (ref 12.3–15.4)
WBC: 4.7 10*3/uL (ref 3.4–10.8)

## 2016-10-19 NOTE — Telephone Encounter (Signed)
-----   Message from Harle BattiestShannon A McGowan, PA-C sent at 10/19/2016  8:39 AM EDT ----- Please let the patient and/or caregivers know that his hemoglobin is rebounding.  We can check in again in one month to see if it's back to his baseline at 12.

## 2016-10-19 NOTE — Telephone Encounter (Signed)
Spoke with pt caregiver, Leanne ChangQueen, in reference to lab results. Queen requested that lab results be faxed over to facility. Leanne ChangQueen then stated that she will have facility physician look at labs and go from there. Results faxed to [facility] [phone].

## 2016-11-18 ENCOUNTER — Emergency Department: Payer: Medicare Other

## 2016-11-18 ENCOUNTER — Emergency Department
Admission: EM | Admit: 2016-11-18 | Discharge: 2016-11-18 | Disposition: A | Discharge status: Home / Self Care | Payer: Medicare Other | Attending: Emergency Medicine | Admitting: Emergency Medicine

## 2016-11-18 DIAGNOSIS — Y999 Unspecified external cause status: Secondary | ICD-10-CM | POA: Insufficient documentation

## 2016-11-18 DIAGNOSIS — Y939 Activity, unspecified: Secondary | ICD-10-CM | POA: Insufficient documentation

## 2016-11-18 DIAGNOSIS — I509 Heart failure, unspecified: Secondary | ICD-10-CM | POA: Insufficient documentation

## 2016-11-18 DIAGNOSIS — Z7982 Long term (current) use of aspirin: Secondary | ICD-10-CM | POA: Diagnosis not present

## 2016-11-18 DIAGNOSIS — Z79899 Other long term (current) drug therapy: Secondary | ICD-10-CM | POA: Diagnosis not present

## 2016-11-18 DIAGNOSIS — Y929 Unspecified place or not applicable: Secondary | ICD-10-CM | POA: Diagnosis not present

## 2016-11-18 DIAGNOSIS — S81811A Laceration without foreign body, right lower leg, initial encounter: Secondary | ICD-10-CM | POA: Diagnosis not present

## 2016-11-18 DIAGNOSIS — W06XXXA Fall from bed, initial encounter: Secondary | ICD-10-CM | POA: Diagnosis not present

## 2016-11-18 DIAGNOSIS — N029 Recurrent and persistent hematuria with unspecified morphologic changes: Secondary | ICD-10-CM

## 2016-11-18 DIAGNOSIS — J449 Chronic obstructive pulmonary disease, unspecified: Secondary | ICD-10-CM | POA: Insufficient documentation

## 2016-11-18 DIAGNOSIS — W19XXXA Unspecified fall, initial encounter: Secondary | ICD-10-CM

## 2016-11-18 DIAGNOSIS — I110 Hypertensive heart disease with heart failure: Secondary | ICD-10-CM | POA: Diagnosis not present

## 2016-11-18 LAB — CBC WITH DIFFERENTIAL/PLATELET
Basophils Absolute: 0.1 10*3/uL (ref 0–0.1)
Basophils Relative: 1 %
Eosinophils Absolute: 0 10*3/uL (ref 0–0.7)
Eosinophils Relative: 0 %
HEMATOCRIT: 31.1 % — AB (ref 40.0–52.0)
HEMOGLOBIN: 10.3 g/dL — AB (ref 13.0–18.0)
LYMPHS ABS: 1.5 10*3/uL (ref 1.0–3.6)
LYMPHS PCT: 12 %
MCH: 28.1 pg (ref 26.0–34.0)
MCHC: 33.1 g/dL (ref 32.0–36.0)
MCV: 84.9 fL (ref 80.0–100.0)
Monocytes Absolute: 0.8 10*3/uL (ref 0.2–1.0)
Monocytes Relative: 6 %
NEUTROS PCT: 81 %
Neutro Abs: 10.6 10*3/uL — ABNORMAL HIGH (ref 1.4–6.5)
Platelets: 199 10*3/uL (ref 150–440)
RBC: 3.66 MIL/uL — AB (ref 4.40–5.90)
RDW: 16.7 % — ABNORMAL HIGH (ref 11.5–14.5)
WBC: 12.9 10*3/uL — AB (ref 3.8–10.6)

## 2016-11-18 LAB — COMPREHENSIVE METABOLIC PANEL
ALK PHOS: 67 U/L (ref 38–126)
ALT: 7 U/L — AB (ref 17–63)
ANION GAP: 4 — AB (ref 5–15)
AST: 16 U/L (ref 15–41)
Albumin: 2.5 g/dL — ABNORMAL LOW (ref 3.5–5.0)
BUN: 13 mg/dL (ref 6–20)
CALCIUM: 8.1 mg/dL — AB (ref 8.9–10.3)
CO2: 21 mmol/L — ABNORMAL LOW (ref 22–32)
CREATININE: 0.9 mg/dL (ref 0.61–1.24)
Chloride: 114 mmol/L — ABNORMAL HIGH (ref 101–111)
Glucose, Bld: 108 mg/dL — ABNORMAL HIGH (ref 65–99)
Potassium: 3.5 mmol/L (ref 3.5–5.1)
SODIUM: 139 mmol/L (ref 135–145)
Total Bilirubin: 0.7 mg/dL (ref 0.3–1.2)
Total Protein: 5.8 g/dL — ABNORMAL LOW (ref 6.5–8.1)

## 2016-11-18 LAB — URINALYSIS, COMPLETE (UACMP) WITH MICROSCOPIC
SPECIFIC GRAVITY, URINE: 1.018 (ref 1.005–1.030)
Squamous Epithelial / LPF: NONE SEEN

## 2016-11-18 LAB — TYPE AND SCREEN
ABO/RH(D): O POS
Antibody Screen: NEGATIVE

## 2016-11-18 LAB — PROTIME-INR
INR: 1.35
PROTHROMBIN TIME: 16.8 s — AB (ref 11.4–15.2)

## 2016-11-18 MED ORDER — CEFTRIAXONE SODIUM-DEXTROSE 1-3.74 GM-% IV SOLR
1.0000 g | Freq: Once | INTRAVENOUS | Status: AC
  Administered 2016-11-18: 1 g via INTRAVENOUS
  Filled 2016-11-18: qty 50

## 2016-11-18 MED ORDER — LIDOCAINE HCL (PF) 1 % IJ SOLN
5.0000 mL | Freq: Once | INTRAMUSCULAR | Status: AC
  Administered 2016-11-18: 5 mL
  Filled 2016-11-18: qty 5

## 2016-11-18 MED ORDER — DEXTROSE 5 % IV SOLN: 1.0000 g | Freq: Once | INTRAVENOUS | Status: DC

## 2016-11-18 MED ORDER — LIDOCAINE HCL (PF) 1 % IJ SOLN
INTRAMUSCULAR | Status: AC
  Administered 2016-11-18: 5 mL
  Filled 2016-11-18: qty 10

## 2016-11-18 MED ORDER — CEPHALEXIN 500 MG PO CAPS: 500.0000 mg | ORAL_CAPSULE | Freq: Four times a day (QID) | ORAL | 0 refills | Status: AC

## 2016-11-18 NOTE — ED Notes (Signed)
Pt. Given cup of water. 

## 2016-11-18 NOTE — ED Notes (Signed)
Pt bandage over R shin changed before transport back to [facility]. Pt stated he is too shaky to sign for DC. Will call [facility] to update them on pt. Pt going back by [facility]

## 2016-11-18 NOTE — ED Provider Notes (Addendum)
[facility] Emergency Department Provider Note       Time seen: ----------------------------------------- 8:07 AM on 11/18/2016 -----------------------------------------  L5 caveat: Review of systems and history is limited by schizophrenia.   I have reviewed the triage vital signs and the nursing notes.   HISTORY   Chief Complaint Weakness and Fall    HPI [name] is a 76 y.o. male who presents to the ED for a fall. Patient states he was feeling lightheaded before he fell. EMS arrived to find a large right lower leg laceration and blood in his Foley bag. Patient currently lives in a group home and EMS was called after a fall. Evidently there was a large amount of blood around the patient at the scene. It is unclear what he cut his leg on, he describes pain 6 out of 10 in his leg. It is unclear how long he has had a Foley catheter or hematuria is new.   Past Medical History:  Diagnosis Date  . Bipolar 1 disorder (HCC)   . CHF (congestive heart failure) (HCC)   . COPD (chronic obstructive pulmonary disease) (HCC)   . DVT (deep venous thrombosis) (HCC)   . GERD (gastroesophageal reflux disease)   . High cholesterol   . Hypertension   . Schizophrenia (HCC)   . Seizure [facility])     Patient Active Problem List   Diagnosis Date Noted  . Pressure injury of skin 09/23/2016  . Back pain 05/28/2016  . Sepsis (HCC) 05/27/2016  . UTI (urinary tract infection) 05/27/2016  . Escherichia coli (E. coli) infection 05/27/2016  . DVT of lower extremity, bilateral (HCC) 05/27/2016  . 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 BPH (benign prostatic hyperplasia) 05/27/2016  . Acute on chronic renal failure (HCC) 05/27/2016  . Bipolar disorder (HCC) 05/27/2016  . Generalized weakness 05/27/2016  . 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 C. difficile colitis 05/27/2016  . Acute kidney injury (HCC) 05/21/2016  . UTI (lower urinary tract infection)  04/23/2016  . 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 Pneumonia 02/21/2015  . ARF (acute renal failure) (HCC) 02/21/2015  . Hypokalemia 02/21/2015  . Leukocytosis 02/21/2015  . Venous insufficiency of both lower extremities 12/04/2014  . Benign essential hypertension 11/25/2014  . Bilateral carotid artery stenosis 11/15/2014  . Bilateral leg edema 11/15/2014  . COPD (chronic obstructive pulmonary disease) (HCC) 11/15/2014  . Gastroesophageal reflux disease 11/15/2014  . Mild aortic valve stenosis 11/15/2014  . Mixed hyperlipidemia 11/15/2014  . Obesity 11/15/2014  . Seizure disorder (HCC) 11/15/2014    Past Surgical History:  Procedure Laterality Date  . NO PAST SURGERIES      Allergies Penicillin g  Social History Social History  Substance Use Topics  . Smoking status: Never Smoker  . Smokeless tobacco: Never Used  . Alcohol use No    Review of Systems Constitutional: Negative for fever. Cardiovascular: Negative for chest pain. Respiratory: Negative for shortness of breath. Gastrointestinal: Negative for abdominal pain, vomiting and diarrhea. Genitourinary: Positive for hematuria Musculoskeletal: Positive for right leg pain Skin: Positive for right leg laceration Neurological: Negative for headaches, focal weakness or numbness.  10-point ROS otherwise negative.  ____________________________________________   PHYSICAL EXAM:  VITAL SIGNS: ED Triage Vitals  Enc Vitals Group     BP 11/18/16 0805 (!) 161/78     Pulse Rate 11/18/16 0805 84     Resp 11/18/16 0805 17     Temp 11/18/16 0805  99.1 F (37.3 C)     Temp Source 11/18/16 0805 Oral     SpO2 11/18/16 0805 100 %     Weight 11/18/16 0805 248 lb (112.5 kg)     Height 11/18/16 0805  (1.778 m)     Head Circumference --      Peak Flow --      Pain Score 11/18/16 0804 6     Pain Loc --      Pain Edu? --      Excl. in GC? --     Constitutional: Alert, No  acute distress Eyes: Conjunctivae are normal. PERRL. Normal extraocular movements. ENT   Head: Normocephalic and atraumatic.   Nose: No congestion/rhinnorhea.   Mouth/Throat: Mucous membranes are moist.   Neck: No stridor. Cardiovascular: Normal rate, regular rhythm. No murmurs, rubs, or gallops. Respiratory: Normal respiratory effort without tachypnea nor retractions. Breath sounds are clear and equal bilaterally. No wheezes/rales/rhonchi. Gastrointestinal: Soft and nontender. Normal bowel sounds Genitourinary: Homero Fellers blood in the Foley catheter and bag. There is blood leaking from around the penis as well. Musculoskeletal: Bilateral lower extremity edema with bilateral erythema and evidence of venous stasis. Right lower extremity laceration Neurologic:  Normal speech and language. No gross focal neurologic deficits are appreciated.  Skin: Large right lower extremity laceration Psychiatric: Mood and affect are normal.  ____________________________________________  EKG: Interpreted by me. Sinus rhythm rate 83 bpm, normal PR interval, normal QS, normal QT.  ____________________________________________  ED COURSE:  Pertinent labs & imaging results that were available during my care of the patient were reviewed by me and considered in my medical decision making (see chart for details). Patient presents for a fall with subsequent laceration and secondarily noted frank hematuria, we will assess with labs and imaging as indicated. Clinical Course as of Nov 18 1104  Thu Nov 18, 2016  0981 Patient with greater then 800 mL severe and in his bladder despite the Foley catheter. This is likely clogged with a clot. Foley catheter will be changed.  [JW]    Clinical Course User Index [JW] Emily Filbert, MD   ..Laceration Repair Date/Time: 11/18/2016 9:10 AM Performed by: Emily Filbert Authorized by: Daryel November E   Consent:    Consent obtained:  Emergent  situation Anesthesia (see MAR for exact dosages):    Anesthesia method:  Local infiltration   Local anesthetic:  Lidocaine 1% w/o epi Laceration details:    Location:  Leg   Leg location:  R lower leg   Length (cm):  30   Depth (mm):  10 Repair type:    Repair type:  Complex Pre-procedure details:    Preparation:  Patient was prepped and draped in usual sterile fashion Exploration:    Limited defect created (wound extended): no     Contaminated: no   Treatment:    Area cleansed with:  Betadine   Amount of cleaning:  Extensive   Irrigation solution:  Sterile saline   Irrigation method:  Syringe   Visualized foreign bodies/material removed: no     Debridement:  None   Undermining:  None   Scar revision: no   Skin repair:    Repair method:  Sutures and staples   Suture size:  3-0   Suture material:  Nylon   Suture technique:  Horizontal mattress   Number of sutures:  6   Number of staples:  27 Approximation:    Approximation:  Close   Vermilion border: well-aligned   Post-procedure  details:    Dressing:  Antibiotic ointment and non-adherent dressing   Patient tolerance of procedure:  Tolerated well, no immediate complications   ____________________________________________   LABS (pertinent positives/negatives)  Labs Reviewed  CBC WITH DIFFERENTIAL/PLATELET - Abnormal; Notable for the following:       Result Value   WBC 12.9 (*)    RBC 3.66 (*)    Hemoglobin 10.3 (*)    HCT 31.1 (*)    RDW 16.7 (*)    Neutro Abs 10.6 (*)    All other components within normal limits  COMPREHENSIVE METABOLIC PANEL - Abnormal; Notable for the following:    Chloride 114 (*)    CO2 21 (*)    Glucose, Bld 108 (*)    Calcium 8.1 (*)    Total Protein 5.8 (*)    Albumin 2.5 (*)    ALT 7 (*)    Anion gap 4 (*)    All other components within normal limits  PROTIME-INR - Abnormal; Notable for the following:    Prothrombin Time 16.8 (*)    All other components within normal limits   URINALYSIS, COMPLETE (UACMP) WITH MICROSCOPIC - Abnormal; Notable for the following:    Color, Urine RED (*)    APPearance BLOODY (*)    Glucose, UA   (*)    Value: TEST NOT REPORTED DUE TO COLOR INTERFERENCE OF URINE PIGMENT   Hgb urine dipstick   (*)    Value: TEST NOT REPORTED DUE TO COLOR INTERFERENCE OF URINE PIGMENT   Bilirubin Urine   (*)    Value: TEST NOT REPORTED DUE TO COLOR INTERFERENCE OF URINE PIGMENT   Ketones, ur   (*)    Value: TEST NOT REPORTED DUE TO COLOR INTERFERENCE OF URINE PIGMENT   Protein, ur   (*)    Value: TEST NOT REPORTED DUE TO COLOR INTERFERENCE OF URINE PIGMENT   Nitrite   (*)    Value: TEST NOT REPORTED DUE TO COLOR INTERFERENCE OF URINE PIGMENT   Leukocytes, UA   (*)    Value: TEST NOT REPORTED DUE TO COLOR INTERFERENCE OF URINE PIGMENT   Bacteria, UA MANY (*)    All other components within normal limits  TYPE AND SCREEN    RADIOLOGY Images were viewed by me  Right tib/fib IMPRESSION: Soft tissue swelling without acute bony abnormalities.  Osseous demineralization with degenerative changes at lateral compartment RIGHT knee. ____________________________________________  FINAL ASSESSMENT AND PLAN  Fall, leg laceration, hematuria  Plan: Patient's labs and imaging were dictated above. Patient had presented for a fall and sustained a large right leg laceration. Wound was repaired. He does have frank hematuria and his bladder was irrigated and then we subsequently changed his Foley catheter. He is on Eliquis and we will hold Eliquis for 24 hours. Overall his labs appear stable but he will need close follow-up for wound care and with urology.   Emily Filbert, MD   Note: This note was generated in part or whole with voice recognition software. Voice recognition is usually quite accurate but there are transcription errors that can and very often do occur. I apologize for any typographical errors that were not detected and corrected.      Emily Filbert, MD 11/18/16 4401    Emily Filbert, MD 11/18/16 [phone]

## 2016-11-18 NOTE — ED Notes (Signed)
Attempted to irrigate foley catheter with 80ml of sterile water with no return, then performed bladder scan and shows >879ml present. EDP notified and request foley be changed.Marland Kitchen

## 2016-11-18 NOTE — ED Triage Notes (Signed)
[facility], states he fell getting out of bed this morning, pt has a lac to the RLE with gauze wrap in place on arrival.  Pt has a foley catheter in place on arrival with blood noted around the meatus and in the bag, states he has had the bag several weeks due to passing blood clots that caused retention. Pt is a/ox3, denies any other injury other then the laceration to the RLE.Marland Kitchen

## 2016-11-20 LAB — URINE CULTURE: Special Requests: NORMAL

## 2016-11-22 ENCOUNTER — Ambulatory Visit (INDEPENDENT_AMBULATORY_CARE_PROVIDER_SITE_OTHER): Payer: Medicare Other

## 2016-11-22 ENCOUNTER — Encounter: Payer: Self-pay | Admitting: Urology

## 2016-11-22 ENCOUNTER — Encounter: Payer: Medicare Other | Attending: Surgery | Admitting: Surgery

## 2016-11-22 VITALS — BP 91/62 | HR 116 | Ht 70.0 in | Wt 253.0 lb

## 2016-11-22 DIAGNOSIS — I890 Lymphedema, not elsewhere classified: Secondary | ICD-10-CM | POA: Insufficient documentation

## 2016-11-22 DIAGNOSIS — J449 Chronic obstructive pulmonary disease, unspecified: Secondary | ICD-10-CM | POA: Insufficient documentation

## 2016-11-22 DIAGNOSIS — Z880 Allergy status to penicillin: Secondary | ICD-10-CM | POA: Diagnosis not present

## 2016-11-22 DIAGNOSIS — Z7982 Long term (current) use of aspirin: Secondary | ICD-10-CM | POA: Diagnosis not present

## 2016-11-22 DIAGNOSIS — Z79899 Other long term (current) drug therapy: Secondary | ICD-10-CM | POA: Insufficient documentation

## 2016-11-22 DIAGNOSIS — G40909 Epilepsy, unspecified, not intractable, without status epilepticus: Secondary | ICD-10-CM | POA: Insufficient documentation

## 2016-11-22 DIAGNOSIS — F319 Bipolar disorder, unspecified: Secondary | ICD-10-CM | POA: Insufficient documentation

## 2016-11-22 DIAGNOSIS — R339 Retention of urine, unspecified: Secondary | ICD-10-CM

## 2016-11-22 DIAGNOSIS — Z86718 Personal history of other venous thrombosis and embolism: Secondary | ICD-10-CM | POA: Diagnosis not present

## 2016-11-22 DIAGNOSIS — I509 Heart failure, unspecified: Secondary | ICD-10-CM | POA: Diagnosis not present

## 2016-11-22 DIAGNOSIS — K219 Gastro-esophageal reflux disease without esophagitis: Secondary | ICD-10-CM | POA: Insufficient documentation

## 2016-11-22 DIAGNOSIS — Z7901 Long term (current) use of anticoagulants: Secondary | ICD-10-CM | POA: Diagnosis not present

## 2016-11-22 DIAGNOSIS — F209 Schizophrenia, unspecified: Secondary | ICD-10-CM | POA: Insufficient documentation

## 2016-11-22 DIAGNOSIS — S81811A Laceration without foreign body, right lower leg, initial encounter: Secondary | ICD-10-CM | POA: Diagnosis not present

## 2016-11-22 DIAGNOSIS — W19XXXA Unspecified fall, initial encounter: Secondary | ICD-10-CM | POA: Insufficient documentation

## 2016-11-22 DIAGNOSIS — L97212 Non-pressure chronic ulcer of right calf with fat layer exposed: Secondary | ICD-10-CM | POA: Insufficient documentation

## 2016-11-22 NOTE — Progress Notes (Addendum)
BROWN, DUNLAP ([id number]) Visit Report for 11/22/2016 Chief Complaint Document Details Patient Name: [name], [name] Date of Service: 11/22/2016 10:30 AM Medical Record Number: [id number] Patient Account Number: [id number] Date of Birth/Sex: 21-Jul-1941 (76 y.o. Male) Treating RN: [name] Primary Care Provider: Karlene [name] Other Clinician: Referring Provider: Daryel [name] Treating Provider/Extender: [name] in Treatment: 0 Information Obtained from: Patient Chief Complaint Patient presents to the wound care center for a consult due non healing wound. The non-healing wound of the right lower extremity which has been present for 4 days. Electronic Signature(s) Signed: 11/22/2016 10:54:07 AM By: [name] Kanner MD, FACS Entered By: [name] on 11/22/2016 10:54:06 [name], [name] ([id number]) -------------------------------------------------------------------------------- HPI Details Patient Name: [name] Date of Service: 11/22/2016 10:30 AM Medical Record Number: [id number] Patient Account Number: [id number] Date of Birth/Sex: [birth year] (76 y.o. Male) Treating RN: [name] Primary Care Provider: Karlene [name] Other Clinician: Referring Provider: Daryel [name] Treating Provider/Extender: [name] in Treatment: 0 History of Present Illness Location: right lower extremity wound Quality: Patient reports experiencing a dull pain to affected area(s). Severity: Patient states wound are getting worse. Duration: Patient has had the wound for 4 days prior to presenting for treatment Timing: Pain in wound is Intermittent (comes and goes Context: The wound occurred when the patient had a fall and cut his leg which required several sutures and staples. Modifying Factors: Consults to this date include:he has had bilateral lower limb lymphedema and has been using lymphedema pumps. Associated Signs and Symptoms:  Patient reports having difficulty standing for long periods. HPI Description: 76 year old male was recently seen in the ER on 11/18/16, due to a fall and had a large right lower leg laceration. past medical history is significant for bipolar disorder, CHF, COPD, DVT, schizophrenia and seizure disorder. In the past he has also been treated by the vascular surgeons for lymphedema and was recommended compression stockings and lymphedema pump. In the ER a right leg x-ray showed soft tissue swelling without any acute bony abnormality and there was demineralization with degenerative changes of the right knee. he had a repair of his wound and his anticoagulation was held for 24 hours. He was asked to follow-up with the wound care department. ER notes the length of the wound was 30 cm and the depth was 10 mm. 3-0 nylon horizontal mattress sutures and multiple staples were used to close the wound. he was prescribed Keflex by the ER. Of note the patient does not have a follow-up appointment at the ER and his care has been transferred over to our wound center ======== Old notes: 76 year old gentleman who has a history of seizures and schizophrenia with bipolar disorder enhances in a assisted living facility. He also has CHF, COPD, GERD and remote history of DVT. He has been treated by the vascular group at [facility] for bilateral lymphedema and has been using lymphedema pumps. This problem started on September 30 when he had a lacerated wound after a fall. The staples were left in for about 10 days and when the staples were removed the wound opened out. Since then he's had several issues with wound infection and the most recent culture showed a heavy growth of Pseudomonas aeruginosa and Staphylococcus aureus and he has received clindamycin and the most recent past and from today he is on levofloxacin. Addendum:Notes were reviewed from a previous vascular consult at [facility] by Dr. Gilda Crease  who saw him on 05/27/2014. He had recommended graduated compression stockings class  I and also added lymph pumps to improve the control of the patient's lymphedema. 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 Diffuse soft-tissue swelling correlate with bone scan recommended. [name], [name] ([id number]) 07/15/2015 -- he is still awaiting insurance clearance for his MRI and other than that he has no other changes. 07/21/2015 -- he now has the insurance clearance and his MRI has been scheduled for December 28th. 08/11/2014 -- the MRI of his left lower extremity showed there was no bone destruction or periosteal reaction and there was a soft tissue wound along the lateral aspect of the distal left lower leg with skin thickening but there was no drainable fluid collection to suggest an abscess. ======= Electronic Signature(s) Signed: 11/22/2016 10:54:33 AM By: [name] Kanner MD, FACS Previous Signature: 11/22/2016 10:31:11 AM Version By: [name] Kanner MD, FACS Previous Signature: 11/22/2016 10:30:30 AM Version By: [name] Kanner MD, FACS Previous Signature: 11/22/2016 10:29:47 AM Version By: [name] Kanner MD, FACS Previous Signature: 11/22/2016 10:28:45 AM Version By: [name] Kanner MD, FACS Entered By: [name] on 11/22/2016 10:54:33 [name], [name] ([id number]) -------------------------------------------------------------------------------- Physical Exam Details Patient Name: [name] Date of Service: 11/22/2016 10:30 AM Medical Record Number: [id number] Patient Account Number: [id number] Date of Birth/Sex: [birth year] (76 y.o. Male) Treating RN: [name] Primary Care Provider: Karlene [name] Other Clinician: Referring Provider: Daryel [name] Treating Provider/Extender: [name] in Treatment: 0 Constitutional . Pulse regular. Respirations normal  and unlabored. Afebrile. . Eyes Nonicteric. Reactive to light. Ears, Nose, Mouth, and Throat Lips, teeth, and gums WNL.Marland Kitchen Moist mucosa without lesions. Neck supple and nontender. No palpable supraclavicular or cervical adenopathy. Normal sized without goiter. Respiratory WNL. No retractions.. Cardiovascular Pedal Pulses WNL. ABI could not be measured due to the wound and his lymphedema. Stage 2 lymphedema bilaterally. Gastrointestinal (GI) Abdomen without masses or tenderness.. No liver or spleen enlargement or tenderness.. Lymphatic No adneopathy. No adenopathy. No adenopathy. Musculoskeletal Adexa without tenderness or enlargement.. Digits and nails w/o clubbing, cyanosis, infection, petechiae, ischemia, or inflammatory conditions.. Integumentary (Hair, Skin) No suspicious lesions. No crepitus or fluctuance. No peri-wound warmth or erythema. No masses.Marland Kitchen Psychiatric Judgement and insight Intact.. No evidence of depression, anxiety, or agitation.. Notes The patient has stage II lymphedema bilaterally on the right lower extremity in the lateral compartment of his leg, he has a large lacerated wound in 2 separate areas, which have been sutured and stapled in place and there is no gaping at the present time. There is some oozing of fluid from the wound edges and part of the subcutaneous tissue is visible. There is a small area of skin which is showing some ecchymosis but is viable at present. Electronic Signature(s) Signed: 11/22/2016 10:57:11 AM By: [name] Kanner MD, FACS Entered By: [name] on 11/22/2016 10:57:10 [name], [name] ([id number]) DEKENDRICK, UZELAC ([id number]) -------------------------------------------------------------------------------- Physician Orders Details Patient Name: [name] Date of Service: 11/22/2016 10:30 AM Medical Record Number: [id number] Patient Account Number: [id number] Date of Birth/Sex: 01-Aug-1941 (76 y.o. Male) Treating RN:  [name] Primary Care Provider: Karlene [name] Other Clinician: Referring Provider: Daryel [name] Treating Provider/Extender: [name] in Treatment: 0 Verbal / Phone Orders: Yes Clinician: Pinkerton, Debi Read Back and Verified: Yes Diagnosis Coding Wound Cleansing Wound #2 Right,Proximal,Anterior Lower Leg o Clean wound with Normal Saline. Wound #3 Right,Distal Lower Leg o Clean wound with Normal Saline. Anesthetic Wound #2 Right,Proximal,Anterior Lower Leg o Topical Lidocaine  4% cream applied to wound bed prior to debridement - for clinic use Wound #3 Right,Distal Lower Leg o Topical Lidocaine 4% cream applied to wound bed prior to debridement - for clinic use Secondary Dressing Wound #2 Right,Proximal,Anterior Lower Leg o ABD pad o Dry Gauze o Non-adherent pad - Telfa Wound #3 Right,Distal Lower Leg o ABD pad o Dry Gauze o Non-adherent pad - Telfa Dressing Change Frequency Wound #2 Right,Proximal,Anterior Lower Leg o Three times weekly - HHRN to change on Wednesdays and Fridays and pt to come have it changed at wound care center on Mondays. Wound #3 Right,Distal Lower Leg o Three times weekly - HHRN to change on Wednesdays and Fridays and pt to come have it changed at wound care center on Mondays. Follow-up Appointments Wound #2 Right,Proximal,Anterior Lower Leg Coad, Delmos L. ([id number]) o Return Appointment in 1 week. Wound #3 Right,Distal Lower Leg o Return Appointment in 1 week. Edema Control Wound #2 Right,Proximal,Anterior Lower Leg o Kerlix and Coban - Right Lower Extremity o Elevate legs to the level of the heart and pump ankles as often as possible Wound #3 Right,Distal Lower Leg o Kerlix and Coban - Right Lower Extremity o Elevate legs to the level of the heart and pump ankles as often as possible Additional Orders / Instructions Wound #2 Right,Proximal,Anterior Lower Leg o Increase  protein intake. Wound #3 Right,Distal Lower Leg o Increase protein intake. Home Health Wound #2 Right,Proximal,Anterior Lower Leg o Continue Home Health Visits - [facility] o Home Health Nurse may visit PRN to address patientos wound care needs. o FACE TO FACE ENCOUNTER: MEDICARE and MEDICAID PATIENTS: I certify that this patient is under my care and that I had a face-to-face encounter that meets the physician face-to-face encounter requirements with this patient on this date. The encounter with the patient was in whole or in part for the following MEDICAL CONDITION: (primary reason for Home Healthcare) MEDICAL NECESSITY: I certify, that based on my findings, NURSING services are a medically necessary home health service. HOME BOUND STATUS: I certify that my clinical findings support that this patient is homebound (i.e., Due to illness or injury, pt requires aid of supportive devices such as crutches, cane, wheelchairs, walkers, the use of special transportation or the assistance of another person to leave their place of residence. There is a normal inability to leave the home and doing so requires considerable and taxing effort. Other absences are for medical reasons / religious services and are infrequent or of short duration when for other reasons). o If current dressing causes regression in wound condition, may D/C ordered dressing product/s and apply Normal Saline Moist Dressing daily until next Wound Healing Center / Other MD appointment. Notify Wound Healing Center of regression in wound condition at [phone]. o Please direct any NON-WOUND related issues/requests for orders to patient's Primary Care Physician Wound #3 Right,Distal Lower Leg o Continue Home Health Visits - [facility] o Home Health Nurse may visit PRN to address patientos wound care needs. o FACE TO FACE ENCOUNTER: MEDICARE and MEDICAID PATIENTS: I certify that this patient is under my care and that I had a  face-to-face encounter that meets the physician face-to-face [name], [name] ([id number]) encounter requirements with this patient on this date. The encounter with the patient was in whole or in part for the following MEDICAL CONDITION: (primary reason for Home Healthcare) MEDICAL NECESSITY: I certify, that based on my findings, NURSING services are a medically necessary home health service. HOME BOUND STATUS: I certify  that my clinical findings support that this patient is homebound (i.e., Due to illness or injury, pt requires aid of supportive devices such as crutches, cane, wheelchairs, walkers, the use of special transportation or the assistance of another person to leave their place of residence. There is a normal inability to leave the home and doing so requires considerable and taxing effort. Other absences are for medical reasons / religious services and are infrequent or of short duration when for other reasons). o If current dressing causes regression in wound condition, may D/C ordered dressing product/s and apply Normal Saline Moist Dressing daily until next Wound Healing Center / Other MD appointment. Notify Wound Healing Center of regression in wound condition at [phone]. o Please direct any NON-WOUND related issues/requests for orders to patient's Primary Care Physician Medications-please add to medication list. Wound #2 Right,Proximal,Anterior Lower Leg o Other: - Vitamin C, Zinc, Multivitamin Wound #3 Right,Distal Lower Leg o Other: - Vitamin C, Zinc, Multivitamin Electronic Signature(s) Signed: 11/22/2016 4:10:34 PM By: [name] Kanner MD, FACS Signed: 11/22/2016 4:31:30 PM By: Alejandro Mulling Entered By: Alejandro Mulling on 11/22/2016 10:51:31 [name], [name] ([id number]) -------------------------------------------------------------------------------- Problem List Details Patient Name: [name] Date of Service: 11/22/2016 10:30 AM Medical  Record Number: [id number] Patient Account Number: [id number] Date of Birth/Sex: 1940-12-08 (76 y.o. Male) Treating RN: [name] Primary Care Provider: Karlene [name] Other Clinician: Referring Provider: Daryel [name] Treating Provider/Extender: [name] in Treatment: 0 Active Problems ICD-10 Encounter Code Description Active Date Diagnosis I89.0 Lymphedema, not elsewhere classified 11/22/2016 Yes I50.9 Heart failure, unspecified 11/22/2016 Yes S81.811A Laceration without foreign body, right lower leg, initial 11/22/2016 Yes encounter L97.212 Non-pressure chronic ulcer of right calf with fat layer 11/22/2016 Yes exposed Inactive Problems Resolved Problems Electronic Signature(s) Signed: 11/22/2016 10:53:18 AM By: [name] Kanner MD, FACS Entered By: [name] on 11/22/2016 10:53:18 [name], [name] ([id number]) -------------------------------------------------------------------------------- Progress Note Details Patient Name: [name] Date of Service: 11/22/2016 10:30 AM Medical Record Number: [id number] Patient Account Number: [id number] Date of Birth/Sex: 27-Aug-1940 (76 y.o. Male) Treating RN: [name] Primary Care Provider: Karlene [name] Other Clinician: Referring Provider: Daryel [name] Treating Provider/Extender: [name] in Treatment: 0 Subjective Chief Complaint Information obtained from Patient Patient presents to the wound care center for a consult due non healing wound. The non-healing wound of the right lower extremity which has been present for 4 days. History of Present Illness (HPI) The following HPI elements were documented for the patient's wound: Location: right lower extremity wound Quality: Patient reports experiencing a dull pain to affected area(s). Severity: Patient states wound are getting worse. Duration: Patient has had the wound for 4 days prior to presenting for treatment Timing: Pain  in wound is Intermittent (comes and goes Context: The wound occurred when the patient had a fall and cut his leg which required several sutures and staples. Modifying Factors: Consults to this date include:he has had bilateral lower limb lymphedema and has been using lymphedema pumps. Associated Signs and Symptoms: Patient reports having difficulty standing for long periods. 76 year old male was recently seen in the ER on 11/18/16, due to a fall and had a large right lower leg laceration. past medical history is significant for bipolar disorder, CHF, COPD, DVT, schizophrenia and seizure disorder. In the past he has also been treated by the vascular surgeons for lymphedema and was recommended compression stockings and lymphedema pump. In the ER a right leg x-ray showed soft tissue swelling without any acute bony abnormality and there  was demineralization with degenerative changes of the right knee. he had a repair of his wound and his anticoagulation was held for 24 hours. He was asked to follow-up with the wound care department. ER notes the length of the wound was 30 cm and the depth was 10 mm. 3-0 nylon horizontal mattress sutures and multiple staples were used to close the wound. he was prescribed Keflex by the ER. Of note the patient does not have a follow-up appointment at the ER and his care has been transferred over to our wound center ======== Old notes: 76 year old gentleman who has a history of seizures and schizophrenia with bipolar disorder enhances in a assisted living facility. He also has CHF, COPD, GERD and remote history of DVT. He has been treated by the vascular group at [facility] for bilateral lymphedema and has been using lymphedema pumps. This problem started on September 30 when he had a lacerated wound after a fall. The staples were left in for about 10 days and when the staples were removed the wound opened out. Since then he's had several issues with wound  infection and the most recent culture showed a heavy growth of Pseudomonas Lynch, Brayn L. ([id number]) aeruginosa and Staphylococcus aureus and he has received clindamycin and the most recent past and from today he is on levofloxacin. Addendum:Notes were reviewed from a previous vascular consult at [facility] by Dr. Gilda Crease who saw him on 05/27/2014. He had recommended graduated compression stockings class I and also added lymph pumps to improve the control of the patient's lymphedema. 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 Diffuse soft-tissue swelling correlate with bone scan recommended. 07/15/2015 -- he is still awaiting insurance clearance for his MRI and other than that he has no other changes. 07/21/2015 -- he now has the insurance clearance and his MRI has been scheduled for December 28th. 08/11/2014 -- the MRI of his left lower extremity showed there was no bone destruction or periosteal reaction and there was a soft tissue wound along the lateral aspect of the distal left lower leg with skin thickening but there was no drainable fluid collection to suggest an abscess. ======= Wound History Patient presents with 1 open wound that has been present for approximately 11/18/2016. Patient has been treating wound in the following manner: plain dry gauze. Laboratory tests have not been performed in the last month. Patient reportedly has not tested positive for an antibiotic resistant organism. Patient History Information obtained from Patient. Allergies penicillin Family History Hypertension - Mother, No family history of Cancer, Diabetes, Heart Disease, Kidney Disease, Lung Disease, Seizures, Stroke, Thyroid Problems. Social History Never smoker, Marital Status - Single, Alcohol Use - Never, Drug Use - No History, Caffeine Use - Daily. Medical And Surgical History  Notes Constitutional Symptoms (General Health) CHF; COPD, Hypertension, GERD, Seizure, high cholesterol, bipolar; DVT (patient states no to DVT) Psychiatric Schizopherenia; Bipolar Medications [name], [name] ([id number]) carvedilol 12.5 mg tablet oral 1 1 tablet oral two times daily Mapap (acetaminophen) 325 mg tablet oral 2 2 tablets oral (650 mg.) every six hours as needed tramadol 50 mg tablet oral 1 1 tablet oral every six hours as needed Anti-Diarrhea 2 mg tablet oral 2 2 tablet oral initial loose stool then one for each loose stool after initial up to eight tabs in 24 hours pravastatin 40 mg tablet oral 1 1 tablet oral daily aripiprazole 20 mg tablet oral  1 1 tablet oral daily phenobarbital 64.8 mg tablet oral 2 2 tablets oral (129.6 ng,) every other day finasteride 5 mg tablet oral 1 1 tablet oral daily tamsulosin 0.4 mg capsule oral 1 1 capsule,extended release 24hr oral daily amlodipine 2.5 mg tablet oral 1 1 tablet oral daily cephalexin 500 mg capsule oral 1 1 capsule oral four times daily for 10 days Eliquis 5 mg tablet oral 1 1 tablet oral two times daily polyethylene glycol 3350 17 gram oral powder packet oral 1 1 powder in packet oral daily as needed furosemide 20 mg tablet oral 1 1 tablet oral daily bupropion HCl XL 300 mg 24 hr tablet, extended release oral 1 1 tablet extended release 24 hr oral daily aspirin 81 mg chewable tablet oral 1 1 tablet,chewable oral daily potassium chloride ER 15 mEq tablet,extended release(part/cryst) oral 2 2 tablet,ER particles/crystals oral (30 mEq) two times daily omeprazole 20 mg capsule,delayed release oral 2 2 capsules (40 mg.) delayed release(DR/EC) oral once daily Vitamin D3 1,000 unit tablet oral 1 1 tablet oral daily Objective Constitutional Pulse regular. Respirations normal and unlabored. Afebrile. Vitals Time Taken: 10:24 AM, Height: 70 in, Source: Stated, Weight: 253 lbs, Source: Measured, BMI: 36.3, Temperature: 97.7  F, Pulse: 72 bpm, Respiratory Rate: 16 breaths/min, Blood Pressure: 90/48 mmHg. Eyes Nonicteric. Reactive to light. Ears, Nose, Mouth, and Throat Lips, teeth, and gums WNL.Marland Kitchen Moist mucosa without lesions. Neck supple and nontender. No palpable supraclavicular or cervical adenopathy. Normal sized without goiter. Respiratory WNL. No retractions.. Cardiovascular Pedal Pulses WNL. ABI could not be measured due to the wound and his lymphedema. Stage 2 [name], [name] ([id number]) lymphedema bilaterally. Gastrointestinal (GI) Abdomen without masses or tenderness.. No liver or spleen enlargement or tenderness.. Lymphatic No adneopathy. No adenopathy. No adenopathy. Musculoskeletal Adexa without tenderness or enlargement.. Digits and nails w/o clubbing, cyanosis, infection, petechiae, ischemia, or inflammatory conditions.Marland Kitchen Psychiatric Judgement and insight Intact.. No evidence of depression, anxiety, or agitation.. General Notes: The patient has stage II lymphedema bilaterally on the right lower extremity in the lateral compartment of his leg, he has a large lacerated wound in 2 separate areas, which have been sutured and stapled in place and there is no gaping at the present time. There is some oozing of fluid from the wound edges and part of the subcutaneous tissue is visible. There is a small area of skin which is showing some ecchymosis but is viable at present. Integumentary (Hair, Skin) No suspicious lesions. No crepitus or fluctuance. No peri-wound warmth or erythema. No masses.. Wound #2 status is Open. Original cause of wound was Trauma. The wound is located on the Right,Proximal,Anterior Lower Leg. The wound measures 15cm length x 0.5cm width x 0.1cm depth; 5.89cm^2 area and 0.589cm^3 volume. The wound is limited to skin breakdown. There is no tunneling or undermining noted. There is a large amount of serosanguineous drainage noted. The wound margin is distinct with the outline  attached to the wound base. There is large (67-100%) red granulation within the wound bed. There is no necrotic tissue within the wound bed. Periwound temperature was noted as No Abnormality. The periwound has tenderness on palpation. Wound #3 status is Open. Original cause of wound was Trauma. The wound is located on the Right,Distal Lower Leg. The wound measures 7cm length x 0.5cm width x 0.1cm depth; 2.749cm^2 area and 0.275cm^3 volume. There is no tunneling or undermining noted. There is a large amount of serosanguineous drainage noted. The wound margin is distinct with the outline  attached to the wound base. There is large (67-100%) red granulation within the wound bed. There is no necrotic tissue within the wound bed. Periwound temperature was noted as No Abnormality. The periwound has tenderness on palpation. Assessment Active Problems ICD-10 I89.0 - Lymphedema, not elsewhere classified I50.9 - Heart failure, unspecified [name], [name] 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 His lymphedema has not been treated with compression wraps or with his lymphedema pumps at the present time. Either case, I do not believe he will be able to use this at the present time. After a careful review I have recommended: 1. cover the wound with Telfa or a nonadherent dressing and gauze and then apply a light Kerlix and Coban and wrap from his toes to below the knee. Should be changed 3 times a week 2. Dinner with oral antibiotics as per his PCP 3. Elevate his limbs as much as possible and has discussed this in great detail with the caregivers 4. Adequate protein, vitamin A, vitamin C and zinc 5. Review of the wound center every  week Plan Wound Cleansing: Wound #2 Right,Proximal,Anterior Lower Leg: Clean wound with Normal Saline. Wound #3 Right,Distal Lower Leg: Clean wound with Normal Saline. Anesthetic: Wound #2 Right,Proximal,Anterior Lower Leg: Topical Lidocaine 4% cream applied to wound bed prior to debridement - for clinic use Wound #3 Right,Distal Lower Leg: Topical Lidocaine 4% cream applied to wound bed prior to debridement - for clinic use Secondary Dressing: Wound #2 Right,Proximal,Anterior Lower Leg: ABD pad Dry Gauze Non-adherent pad - Telfa Wound #3 Right,Distal Lower Leg: ABD pad Dry Gauze Non-adherent pad - Telfa Dressing Change Frequency: Wound #2 Right,Proximal,Anterior Lower Leg: Three times weekly - HHRN to change on Wednesdays and Fridays and pt to come have it changed at wound care center on Mondays. Wound #3 Right,Distal Lower Leg: Three times weekly - HHRN to change on Wednesdays and Fridays and pt to come have it changed at wound care center on Mondays. Follow-up Appointments: Wound #2 Right,Proximal,Anterior Lower Leg: [name], [name]. ([id number]) Return Appointment in 1 week. Wound #3 Right,Distal Lower Leg: Return Appointment in 1 week. Edema Control: Wound #2 Right,Proximal,Anterior Lower Leg: Kerlix and Coban - Right Lower Extremity Elevate legs to the level of the heart and pump ankles as often as possible Wound #3 Right,Distal Lower Leg: Kerlix and Coban - Right Lower Extremity Elevate legs to the level of the heart and pump ankles as often as possible Additional Orders / Instructions: Wound #2 Right,Proximal,Anterior Lower Leg: Increase protein intake. Wound #3 Right,Distal Lower Leg: Increase protein intake. Home Health: Wound #2 Right,Proximal,Anterior Lower Leg: Continue Home Health Visits - [facility] Home Health Nurse may visit PRN to address patient s wound care needs. FACE TO FACE ENCOUNTER: MEDICARE and MEDICAID PATIENTS: I certify that this patient is  under my care and that I had a face-to-face encounter that meets the physician face-to-face encounter requirements with this patient on this date. The encounter with the patient was in whole or in part for the following MEDICAL CONDITION: (primary reason for Home Healthcare) MEDICAL NECESSITY: I certify, that based on my findings, NURSING services are a medically necessary home health service. HOME BOUND STATUS: I certify that my clinical findings support that this patient is homebound (i.e., Due  to illness or injury, pt requires aid of supportive devices such as crutches, cane, wheelchairs, walkers, the use of special transportation or the assistance of another person to leave their place of residence. There is a normal inability to leave the home and doing so requires considerable and taxing effort. Other absences are for medical reasons / religious services and are infrequent or of short duration when for other reasons). If current dressing causes regression in wound condition, may D/C ordered dressing product/s and apply Normal Saline Moist Dressing daily until next Wound Healing Center / Other MD appointment. Notify Wound Healing Center of regression in wound condition at [phone]. Please direct any NON-WOUND related issues/requests for orders to patient's Primary Care Physician Wound #3 Right,Distal Lower Leg: Continue Home Health Visits - [facility] Home Health Nurse may visit PRN to address patient s wound care needs. FACE TO FACE ENCOUNTER: MEDICARE and MEDICAID PATIENTS: I certify that this patient is under my care and that I had a face-to-face encounter that meets the physician face-to-face encounter requirements with this patient on this date. The encounter with the patient was in whole or in part for the following MEDICAL CONDITION: (primary reason for Home Healthcare) MEDICAL NECESSITY: I certify, that based on my findings, NURSING services are a medically necessary home health  service. HOME BOUND STATUS: I certify that my clinical findings support that this patient is homebound (i.e., Due to illness or injury, pt requires aid of supportive devices such as crutches, cane, wheelchairs, walkers, the use of special transportation or the assistance of another person to leave their place of residence. There is a normal inability to leave the home and doing so requires considerable and taxing effort. Other absences are for medical reasons / religious services and are infrequent or of short duration when for other reasons). If current dressing causes regression in wound condition, may D/C ordered dressing product/s and apply Normal Saline Moist Dressing daily until next Wound Healing Center / Other MD appointment. Notify Wound Healing Center of regression in wound condition at [phone]. 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 His lymphedema has not been treated with compression wraps or with his lymphedema pumps at the present time. Either case, I do not believe he will be able to use this at the present time. After a careful review I have recommended: 1. cover the wound with Telfa or a nonadherent dressing and gauze and then apply a light Kerlix and Coban and wrap from his toes to below the knee. Should be changed 3 times a week 2. Dinner with oral antibiotics as per his PCP 3. Elevate his limbs as much as possible and has discussed this in great detail with the caregivers 4. Adequate protein, vitamin A, vitamin C and zinc 5. Review of the wound  center every week Electronic Signature(s) Signed: 11/22/2016 10:59:46 AM By: [name] Kanner MD, FACS Entered By: [name] on 11/22/2016 10:59:45 Hladik, [name] ([id number]) -------------------------------------------------------------------------------- ROS/PFSH Details Patient Name: [name] Date of Service: 11/22/2016 10:30 AM Medical Record Number: [id number] Patient Account Number: [id number] Date of Birth/Sex: Jun 27, 1941 (76 y.o. Male) Treating RN: [name] Primary Care Provider: Kerry Dory,  Davonna Belling Other Clinician: Referring Provider: Daryel [name] Treating Provider/Extender: [name] in Treatment: 0 Information Obtained From Patient Wound History Do you currently have one or more open woundso Yes How many open wounds do you currently haveo 1 Approximately how long have you had your woundso 11/18/2016 How have you been treating your wound(s) until nowo plain dry gauze Has your wound(s) ever healed and then [name]-openedo No Have you had any lab work done in the past montho No 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, VRE)o No Constitutional Symptoms (General Health) Medical History: Past Medical History Notes: CHF; COPD, Hypertension, GERD, Seizure, high cholesterol, bipolar; DVT (patient states no to DVT) Eyes Medical History: Negative for: Cataracts; Glaucoma; Optic Neuritis Ear/Nose/Mouth/Throat Medical History: Negative for: Chronic sinus problems/congestion; Middle ear problems Hematologic/Lymphatic Medical History: Positive for: Lymphedema Negative for: Anemia; Hemophilia; Human Immunodeficiency Virus Respiratory Medical History: Positive for: Chronic Obstructive Pulmonary Disease (COPD); Sleep Apnea - has c-pap but will not wear it Negative for: Aspiration; Asthma; Pneumothorax; Tuberculosis Cardiovascular Medical History: Positive for: Congestive Heart Failure; Deep Vein Thrombosis;  Hypertension Ealy, Tacari L. ([id number]) Negative for: Angina; Arrhythmia; Coronary Artery Disease; Hypotension; Myocardial Infarction; Peripheral Arterial Disease; Peripheral Venous Disease; Phlebitis; Vasculitis Gastrointestinal Medical History: Negative for: Cirrhosis ; Colitis; Crohnos; Hepatitis A; Hepatitis B; Hepatitis C Endocrine Medical History: Negative for: Type I Diabetes; Type II Diabetes Genitourinary Medical History: Negative for: End Stage Renal Disease Immunological Medical History: Negative for: Lupus Erythematosus; Raynaudos; Scleroderma Integumentary (Skin) Medical History: Negative for: History of Burn; History of pressure wounds Musculoskeletal Medical History: Negative for: Gout; Rheumatoid Arthritis; Osteoarthritis; Osteomyelitis Neurologic Medical History: Positive for: Seizure Disorder Negative for: Dementia; Neuropathy; Quadriplegia; Paraplegia Oncologic Medical History: Negative for: Received Chemotherapy; Received Radiation Psychiatric Medical History: Past Medical History Notes: Schizopherenia; Bipolar Immunizations Pneumococcal Vaccine: JAEGAR, CROFT ([id number]) Received Pneumococcal Vaccination: No Family and Social History Cancer: No; Diabetes: No; Heart Disease: No; Hypertension: Yes - Mother; Kidney Disease: No; Lung Disease: No; Seizures: No; Stroke: No; Thyroid Problems: No; Never smoker; Marital Status - Single; Alcohol Use: Never; Drug Use: No History; Caffeine Use: Daily; Advanced Directives: No; Patient does not want information on Advanced Directives; Living Will: No; Medical Power of Attorney: No Physician Affirmation I have reviewed and agree with the above information. Electronic Signature(s) Signed: 11/22/2016 4:10:34 PM By: [name] Kanner MD, FACS Signed: 11/22/2016 4:31:30 PM By: Alejandro Mulling Entered By: [name] on 11/22/2016 10:52:08 Rosensteel, [name]  ([id number]) -------------------------------------------------------------------------------- SuperBill Details Patient Name: [name] Date of Service: 11/22/2016 Medical Record Number: [id number] Patient Account Number: [id number] Date of Birth/Sex: [birth year] (76 y.o. Male) Treating RN: [name] Primary Care Provider: Karlene [name] Other Clinician: Referring Provider: Daryel [name] Treating Provider/Extender: [name] in Treatment: 0 Diagnosis Coding ICD-10 Codes Code Description I89.0 Lymphedema, not elsewhere classified I50.9 Heart failure, unspecified S81.811A Laceration without foreign body, right lower leg, initial encounter L97.212 Non-pressure chronic ulcer of right calf with fat layer exposed Facility Procedures CPT4 Code: [id number] Description: 32440 - WOUND CARE VISIT-LEV 5 EST PT Modifier: Quantity: 1 Physician Procedures CPT4 Code Description: [id number] 66440 - WC PHYS LEVEL 4 - EST PT ICD-10 Description Diagnosis I89.0 Lymphedema, not elsewhere classified S81.811A Laceration without foreign body, right lower leg, init L97.212 Non-pressure chronic ulcer of right calf with  fat laye Modifier: ial encounte r exposed Quantity: 1 r Electronic Signature(s) Signed: 11/22/2016 4:10:34 PM By: [name] Kanner MD, FACS Signed: 11/22/2016 4:31:30 PM By: Alejandro Mulling Previous Signature: 11/22/2016  11:17:48 AM Version By: [name] Kanner MD, FACS Entered By: Alejandro Mulling on 11/22/2016 12:26:09

## 2016-11-22 NOTE — Progress Notes (Signed)
Pt presented today with c/o gross hematuria. Pt fell at facility on 11/18/16. Pt had gross hematuria prior to fall, per caregiver. Pt was seen in the ER for fall. While at ER physician noted gross hematuria and advised pt to stop Eliquis for 24hrs. Per caregiver urine cleared up while off of Eliquis. Since restarting Eliquis urine has become merlot in color. Reinforced with pt and caregiver from a urology perspective pt should increase fluid intake. Reinforced with pt and caregiver the ideology of hematuria in reference to urology. Reinforced with caregiver to call Dr. Gwen Pounds in reference to Eliquis and what should be done next. Caregiver and pt voiced understanding of whole conversation.   Blood pressure 91/62, pulse (!) 116, height  (1.778 m), weight 253 lb (114.8 kg).

## 2016-11-23 NOTE — Progress Notes (Signed)
[name], [name] ([id number]) Visit Report for 11/22/2016 Allergy List Details Patient Name: [name], [name] Date of Service: 11/22/2016 10:30 AM Medical Record Number: [id number] Patient Account Number: [id number] Date of Birth/Sex: Dec 29, 1940 (76 y.o. Male) Treating RN: [name] Primary Care [name]: [name] Other Clinician: Referring [name]: [name] Treating Marsean Elkhatib/Extender: [name] in Treatment: 0 Allergies Active Allergies penicillin Allergy Notes Electronic Signature(s) Signed: 11/22/2016 4:31:30 PM By: [name] Entered By: [name] on 11/22/2016 10:26:03 [name] ([id number]) -------------------------------------------------------------------------------- Arrival Information Details Patient Name: [name] Date of Service: 11/22/2016 10:30 AM Medical Record Number: [id number] Patient Account Number: [id number] Date of Birth/Sex: 1941/05/20 (76 y.o. Male) Treating RN: [name] Primary Care Dalen [place]enn[place]: [name] Other Clinician: Referring Nelson Noone: [name] Treating Naydene Kamrowski/Extender: [name] in Treatment: 0 Visit Information Patient Arrived: Wheel Chair Arrival Time: 10:22 Accompanied By: caregiver Transfer Assistance: Michiel Sites Lift Patient Identification Verified: Yes Secondary Verification Process Yes Completed: Patient Requires Transmission- No Based Precautions: Patient Has Alerts: Yes Patient Alerts: Patient on Blood Thinner Eliquis History Since Last Visit All ordered tests and consults were completed: No Added or deleted any medications: No Any new allergies or adverse reactions: No Had a fall or experienced change in activities of daily living that may affect risk of falls: No Signs or symptoms of abuse/neglect since last visito No Hospitalized since last visit: No Electronic Signature(s) Signed: 11/22/2016 4:31:30 PM By: [name] Entered By: [name] on 11/22/2016 11:17:54 [name] ([id number]) -------------------------------------------------------------------------------- Clinic Level of Care Assessment Details Patient Name: [name] Date of Service: 11/22/2016 10:30 AM Medical Record Number: [id number] Patient Account Number: [id number] Date of Birth/Sex: April 24, 1941 (76 y.o. Male) Treating RN: [name] Primary Care Morayma Godown: [name] Other Clinician: Referring Reisha Wos: [name] Treating Audrey Eller/Extender: [name] in Treatment: 0 Clinic Level of Care Assessment Items TOOL 2 Quantity Score X - Use when only an EandM is performed on the INITIAL visit 1 0 ASSESSMENTS - Nursing Assessment / Reassessment X - General Physical Exam (combine w/ comprehensive assessment (listed just 1 20 below) when performed on new pt. evals) X - Comprehensive Assessment (HX, ROS, Risk Assessments, Wounds Hx, etc.) 1 25 ASSESSMENTS - Wound and Skin Assessment / Reassessment  - Simple Wound Assessment / Reassessment - one wound 0 X - Complex Wound Assessment / Reassessment - multiple wounds 2 5  - Dermatologic / Skin Assessment (not related to wound area) 0 ASSESSMENTS - Ostomy and/or Continence Assessment and Care  - Incontinence Assessment and Management 0  - Ostomy Care Assessment and Management (repouching, etc.) 0 PROCESS - Coordination of Care  - Simple Patient / Family Education for ongoing care 0 X - Complex (extensive) Patient / Family Education for ongoing care 1 20 X - Staff obtains Chiropractor, Records, Test Results / Process Orders 1 10 X - Staff telephones HHA, Nursing Homes / Clarify orders / etc 1 10  - Routine Transfer to another Facility (non-emergent condition) 0  - Routine Hospital Admission (non-emergent condition) 0 X - New Admissions / Manufacturing engineer / Ordering NPWT, Apligraf, etc. 1 15  - Emergency Hospital  Admission (emergent condition) 0 X - Simple Discharge Coordination 1 10 [name], [name] ([id number])  - Complex (extensive) Discharge Coordination 0 PROCESS - Special Needs  - Pediatric / Minor Patient Management 0  - Isolation Patient Management 0  - Hearing / Language / Visual special needs 0  - Assessment of Community assistance (transportation, D/C planning, etc.)  0  - Additional assistance / Altered mentation 0  - Support Surface(s) Assessment (bed, cushion, seat, etc.) 0 INTERVENTIONS - Wound Cleansing / Measurement X - Wound Imaging (photographs - any number of wounds) 1 5  - Wound Tracing (instead of photographs) 0  - Simple Wound Measurement - one wound 0 X - Complex Wound Measurement - multiple wounds 2 5  - Simple Wound Cleansing - one wound 0 X - Complex Wound Cleansing - multiple wounds 2 5 INTERVENTIONS - Wound Dressings  - Small Wound Dressing one or multiple wounds 0  - Medium Wound Dressing one or multiple wounds 0 X - Large Wound Dressing one or multiple wounds 1 20  - Application of Medications - injection 0 INTERVENTIONS - Miscellaneous  - External ear exam 0  - Specimen Collection (cultures, biopsies, blood, body fluids, etc.) 0  - Specimen(s) / Culture(s) sent or taken to Lab for analysis 0 X - Patient Transfer (multiple staff / Nurse, adult / Similar devices) 1 10  - Simple Staple / Suture removal (25 or less) 0  - Complex Staple / Suture removal (26 or more) 0 [name], [name] ([id number])  - Hypo / Hyperglycemic Management (close monitor of Blood Glucose) 0  - Ankle / Brachial Index (ABI) - do not check if billed separately 0 Has the patient been seen at the hospital within the last three years: Yes Total Score: 175 Level Of Care: New/Established - Level 5 Electronic Signature(s) Signed: 11/22/2016 4:31:30 PM By: [name] Entered By: [name] on 11/22/2016 12:26:00 Ridlon, Doy [name]  ([id number]) -------------------------------------------------------------------------------- Encounter Discharge Information Details Patient Name: [name] Date of Service: 11/22/2016 10:30 AM Medical Record Number: [id number] Patient Account Number: [id number] Date of Birth/Sex: Aug 26, 1940 (76 y.o. Male) Treating RN: [name] Primary Care Aislee Landgren: [name] Other Clinician: Referring Scotland Korver: [name] Treating Lige Lakeman/Extender: [name] in Treatment: 0 Encounter Discharge Information Items Discharge Pain Level: 4 Discharge Condition: Stable Ambulatory Status: Wheelchair Discharge Destination: Nursing Home Transportation: Private Auto Accompanied By: caregiver Schedule Follow-up Appointment: Yes Medication Reconciliation completed and provided to Patient/Care No Kyira Volkert: Provided on Clinical Summary of Care: 11/22/2016 Form Type Recipient Paper Patient JC Electronic Signature(s) Signed: 11/22/2016 4:31:30 PM By: [name] Previous Signature: 11/22/2016 11:06:47 AM Version By: Gwenlyn Perking Entered By: [name] on 11/22/2016 11:16:51 Deroos, Doy [name] ([id number]) -------------------------------------------------------------------------------- Lower Extremity Assessment Details Patient Name: [name] Date of Service: 11/22/2016 10:30 AM Medical Record Number: [id number] Patient Account Number: [id number] Date of Birth/Sex: 1940-11-15 (76 y.o. Male) Treating RN: [name] Primary Care Amoni Scallan: [name] Other Clinician: Referring Lucianne Smestad: [name] Treating Ayden Hardwick/Extender: [name] in Treatment: 0 Vascular Assessment Pulses: Dorsalis Pedis Palpable: [Right:No] Doppler Audible: [Right:Yes] Posterior Tibial Extremity colors, hair growth, and conditions: Extremity Color: [Right:Hyperpigmented] Temperature of Extremity: [Right:Cool] Capillary Refill: [Right:> 3  seconds] Toe Nail Assessment Left: Right: Thick: Yes Discolored: Yes Deformed: Yes Improper Length and Hygiene: No Electronic Signature(s) Signed: 11/22/2016 4:31:30 PM By: [name] Entered By: [name] on 11/22/2016 10:43:51 Silverthorn, Doy [name] ([id number]) -------------------------------------------------------------------------------- Multi Wound Chart Details Patient Name: [name] Date of Service: 11/22/2016 10:30 AM Medical Record Number: [id number] Patient Account Number: [id number] Date of Birth/Sex: [birth year] (76 y.o. Male) Treating RN: [name] Primary Care Sean Malinowski: [name] Other Clinician: Referring Devaun Hernandez: [name] Treating Ibraham Levi/Extender: [name] in Treatment: 0 Vital Signs Height(in): 70 Pulse(bpm): 72 Weight(lbs): 253 Blood Pressure 90/48 (mmHg): Body Mass Index(BMI): 36 Temperature(F): 97.7 Respiratory Rate 16 (  breaths/min): Photos: [2:No Photos] [3:No Photos] [N/A:N/A] Wound Location: [2:Right Lower Leg - Anterior, Proximal] [3:Right Lower Leg - Distal] [N/A:N/A] Wounding Event: [2:Trauma] [3:Trauma] [N/A:N/A] Primary Etiology: [2:Trauma, Other] [3:Trauma, Other] [N/A:N/A] Comorbid History: [2:Lymphedema, Chronic Obstructive Pulmonary Disease (COPD), Sleep Apnea, Congestive Heart Failure, Deep Vein Thrombosis, Hypertension, Seizure Disorder] [3:Lymphedema, Chronic Obstructive Pulmonary Disease (COPD), Sleep Apnea,  Congestive Heart Failure, Deep Vein Thrombosis, Hypertension, Seizure Disorder] [N/A:N/A] Date Acquired: [2:11/18/2016] [3:11/18/2016] [N/A:N/A] Weeks of Treatment: [2:0] [3:0] [N/A:N/A] Wound Status: [2:Open] [3:Open] [N/A:N/A] Measurements L x W x D 15x0.5x0.1 [3:7x0.5x0.1] [N/A:N/A] (cm) Area (cm) : [2:5.89] [3:2.749] [N/A:N/A] Volume (cm) : [2:0.589] [3:0.275] [N/A:N/A] Classification: [2:Partial Thickness] [3:Partial Thickness] [N/A:N/A] Exudate Amount: [2:Large]  [3:Large] [N/A:N/A] Exudate Type: [2:Serosanguineous] [3:Serosanguineous] [N/A:N/A] Exudate Color: [2:red, brown] [3:red, brown] [N/A:N/A] Wound Margin: [2:Distinct, outline attached] [3:Distinct, outline attached] [N/A:N/A] Granulation Amount: [2:Large (67-100%)] [3:Large (67-100%)] [N/A:N/A] Granulation Quality: [2:Red] [3:Red] [N/A:N/A] Necrotic Amount: [2:None Present (0%)] [3:None Present (0%)] [N/A:N/A] Exposed Structures: [2:Fascia: No Fat Layer (Subcutaneous] [3:N/A] [N/A:N/A] Tissue) Exposed: No Tendon: No Muscle: No Joint: No Bone: No Limited to Skin Breakdown Epithelialization: None None N/A Periwound Skin Texture: No Abnormalities Noted No Abnormalities Noted N/A Periwound Skin No Abnormalities Noted No Abnormalities Noted N/A Moisture: Periwound Skin Color: No Abnormalities Noted No Abnormalities Noted N/A Temperature: No Abnormality No Abnormality N/A Tenderness on Yes Yes N/A Palpation: Wound Preparation: Ulcer Cleansing: Ulcer Cleansing: N/A Rinsed/Irrigated with Rinsed/Irrigated with Saline Saline Topical Anesthetic Topical Anesthetic Applied: None Applied: None Treatment Notes Wound #2 (Right, Proximal, Anterior Lower Leg) 1. Cleansed with: Clean wound with Normal Saline 5. Secondary Dressing Applied ABD Pad Dry Gauze Non-Adherent pad 7. Secured with Tape Notes kerlix, coban Wound #3 (Right, Distal Lower Leg) 1. Cleansed with: Clean wound with Normal Saline 5. Secondary Dressing Applied ABD Pad Dry Gauze Non-Adherent pad 7. Secured with Tape Notes kierre, hintz ([id number]) Electronic Signature(s) Signed: 11/22/2016 4:31:30 PM By: [name] Previous Signature: 11/22/2016 10:53:26 AM Version By: Evlyn Kanner MD, FACS Entered By: [name] on 11/22/2016 11:16:11 Schaad, Doy [name] ([id number]) -------------------------------------------------------------------------------- Multi-Disciplinary Care Plan  Details Patient Name: [name] Date of Service: 11/22/2016 10:30 AM Medical Record Number: [id number] Patient Account Number: [id number] Date of Birth/Sex: 28-May-1941 (76 y.o. Male) Treating RN: [name] Primary Care Jasen Hartstein: [name] Other Clinician: Referring Dontavion Noxon: [name] Treating Tempie Gibeault/Extender: [name] in Treatment: 0 Active Inactive ` Abuse / Safety / Falls / Self Care Management Nursing Diagnoses: Potential for falls Goals: Patient will remain injury free Date Initiated: 11/22/2016 Target Resolution Date: 02/05/2017 Goal Status: Active Interventions: Assess fall risk on admission and as needed Assess impairment of mobility on admission and as needed per policy Notes: ` Nutrition Nursing Diagnoses: Imbalanced nutrition Potential for alteratiion in Nutrition/Potential for imbalanced nutrition Goals: Patient/caregiver agrees to and verbalizes understanding of need to use nutritional supplements and/or vitamins as prescribed Date Initiated: 11/22/2016 Target Resolution Date: 02/12/2017 Goal Status: Active Interventions: Assess patient nutrition upon admission and as needed per policy Provide education on nutrition Notes: ` Orientation to the Wound Care Program JOEVON, HOLLIMAN ([id number]) Nursing Diagnoses: Knowledge deficit related to the wound healing center program Goals: Patient/caregiver will verbalize understanding of the Wound Healing Center Program Date Initiated: 11/22/2016 Target Resolution Date: 12/11/2016 Goal Status: Active Interventions: Provide education on orientation to the wound center Notes: ` Pain, Acute or Chronic Nursing Diagnoses: Pain, acute or chronic: actual or potential Potential alteration in comfort, pain Goals: Patient/caregiver will verbalize adequate pain control between  visits Date Initiated: 11/22/2016 Target Resolution Date: 03/12/2017 Goal Status:  Active Interventions: Assess comfort goal upon admission Complete pain assessment as per visit requirements Notes: ` Wound/Skin Impairment Nursing Diagnoses: Impaired tissue integrity Knowledge deficit related to ulceration/compromised skin integrity Goals: Ulcer/skin breakdown will have a volume reduction of 80% by week 12 Date Initiated: 11/22/2016 Target Resolution Date: 03/05/2017 Goal Status: Active Interventions: Assess patient/caregiver ability to perform ulcer/skin care regimen upon admission and as needed Assess ulceration(s) every visit BRITNEY, CAPTAIN ([id number]) Notes: Electronic Signature(s) Signed: 11/22/2016 4:31:30 PM By: [name] Entered By: [name] on 11/22/2016 11:14:30 Vater, Doy [name] ([id number]) -------------------------------------------------------------------------------- Pain Assessment Details Patient Name: [name] Date of Service: 11/22/2016 10:30 AM Medical Record Number: [id number] Patient Account Number: [id number] Date of Birth/Sex: [birth year] (76 y.o. Male) Treating RN: [name] Primary Care Cesareo Vickrey: [name] Other Clinician: Referring Mortimer Bair: [name] Treating Aaden Buckman/Extender: [name] in Treatment: 0 Active Problems Location of Pain Severity and Description of Pain Patient Has Paino Yes Site Locations Pain Location: Pain in Ulcers With Dressing Change: Yes Rate the pain. Current Pain Level: 7 Character of Pain Describe the Pain: Aching Pain Management and Medication Current Pain Management: Electronic Signature(s) Signed: 11/22/2016 4:31:30 PM By: [name] Entered By: [name] on 11/22/2016 11:18:07 Schumm, Doy [name] ([id number]) -------------------------------------------------------------------------------- Patient/Caregiver Education Details Patient Name: [name] Date of Service: 11/22/2016 10:30 AM Medical Record Number:  [id number] Patient Account Number: [id number] Date of Birth/Gender: 01-Jul-1941 (76 y.o. Male) Treating RN: [name] Primary Care Physician: [name] Other Clinician: Referring Physician: Daryel [name] Treating Physician/Extender: [name] in Treatment: 0 Education Assessment Education Provided To: Patient and Caregiver Education Topics Provided Nutrition: Handouts: Nutrition Methods: Explain/Verbal Responses: State content correctly Welcome To The [facility]: Handouts: Welcome To [facility] Methods: Explain/Verbal Responses: State content correctly Wound/Skin Impairment: Handouts: Other: change dressing as ordered Methods: Demonstration, Explain/Verbal Responses: State content correctly Electronic Signature(s) Signed: 11/22/2016 4:31:30 PM By: [name] Entered By: [name] on 11/22/2016 11:17:41 Ledger, Doy [name] ([id number]) -------------------------------------------------------------------------------- Wound Assessment Details Patient Name: [name] Date of Service: 11/22/2016 10:30 AM Medical Record Number: [id number] Patient Account Number: [id number] Date of Birth/Sex: 20-May-1941 (76 y.o. Male) Treating RN: [name] Primary Care Edwin Baines: [name] Other Clinician: Referring Zaida Reiland: [name] Treating Melanye Hiraldo/Extender: [name] in Treatment: 0 Wound Status Wound Number: 2 Primary Trauma, Other Etiology: Wound Location: Right Lower Leg - Anterior, Proximal Wound Open Status: Wounding Event: Trauma Comorbid Lymphedema, Chronic Obstructive Date Acquired: 11/18/2016 History: Pulmonary Disease (COPD), Sleep Weeks Of Treatment: 0 Apnea, Congestive Heart Failure, Deep Clustered Wound: No Vein Thrombosis, Hypertension, Seizure Disorder Photos Photo Uploaded By: [name] on 11/22/2016 16:26:37 Wound Measurements Length: (cm) 15 Width: (cm)  0.5 Depth: (cm) 0.1 Area: (cm) 5.89 Volume: (cm) 0.589 % Reduction in Area: % Reduction in Volume: Epithelialization: None Tunneling: No Undermining: No Wound Description Classification: Partial Thickness Foul Odor Aft Wound Margin: Distinct, outline attached Slough/Fibrin Exudate Amount: Large Exudate Type: Serosanguineous Exudate Color: red, brown er Cleansing: No o No Wound Bed Granulation Amount: Large (67-100%) Exposed Structure Granulation Quality: Red Fascia Exposed: No Silversmith, Ernie L. ([id number]) Necrotic Amount: None Present (0%) Fat Layer (Subcutaneous Tissue) Exposed: No Tendon Exposed: No Muscle Exposed: No Joint Exposed: No Bone Exposed: No Limited to Skin Breakdown Periwound Skin Texture Texture Color No Abnormalities Noted: No No Abnormalities Noted: No Moisture Temperature / Pain No Abnormalities Noted: No Temperature: No Abnormality Tenderness on  Palpation: Yes Wound Preparation Ulcer Cleansing: Rinsed/Irrigated with Saline Topical Anesthetic Applied: None Treatment Notes Wound #2 (Right, Proximal, Anterior Lower Leg) 1. Cleansed with: Clean wound with Normal Saline 5. Secondary Dressing Applied ABD Pad Dry Gauze Non-Adherent pad 7. Secured with Tape Notes kerlix, coban Electronic Signature(s) Signed: 11/22/2016 4:31:30 PM By: [name] Entered By: [name] on 11/22/2016 10:42:20 Hartner, Doy [name] ([id number]) -------------------------------------------------------------------------------- Wound Assessment Details Patient Name: [name] Date of Service: 11/22/2016 10:30 AM Medical Record Number: [id number] Patient Account Number: [id number] Date of Birth/Sex: Sep 01, 1940 (76 y.o. Male) Treating RN: [name] Primary Care Oris Calmes: [name] Other Clinician: Referring Meagon Duskin: [name] Treating Enjoli Tidd/Extender: [name] in Treatment: 0 Wound Status Wound Number: 3  Primary Trauma, Other Etiology: Wound Location: Right Lower Leg - Distal Wound Open Wounding Event: Trauma Status: Date Acquired: 11/18/2016 Comorbid Lymphedema, Chronic Obstructive Weeks Of Treatment: 0 History: Pulmonary Disease (COPD), Sleep Clustered Wound: No Apnea, Congestive Heart Failure, Deep Vein Thrombosis, Hypertension, Seizure Disorder Photos Photo Uploaded By: [name] on 11/22/2016 16:26:38 Wound Measurements Length: (cm) 7 Width: (cm) 0.5 Depth: (cm) 0.1 Area: (cm) 2.749 Volume: (cm) 0.275 % Reduction in Area: % Reduction in Volume: Epithelialization: None Tunneling: No Undermining: No Wound Description Classification: Partial Thickness Foul Odor Aft Wound Margin: Distinct, outline attached Slough/Fibrin Exudate Amount: Large Exudate Type: Serosanguineous Exudate Color: red, brown er Cleansing: No o No Wound Bed Granulation Amount: Large (67-100%) Granulation Quality: Red Sanfilippo, Torrance L. ([id number]) Necrotic Amount: None Present (0%) Periwound Skin Texture Texture Color No Abnormalities Noted: No No Abnormalities Noted: No Moisture Temperature / Pain No Abnormalities Noted: No Temperature: No Abnormality Tenderness on Palpation: Yes Wound Preparation Ulcer Cleansing: Rinsed/Irrigated with Saline Topical Anesthetic Applied: None Treatment Notes Wound #3 (Right, Distal Lower Leg) 1. Cleansed with: Clean wound with Normal Saline 5. Secondary Dressing Applied ABD Pad Dry Gauze Non-Adherent pad 7. Secured with Tape Notes kerlix, coban Electronic Signature(s) Signed: 11/22/2016 4:31:30 PM By: [name] Entered By: [name] on 11/22/2016 10:43:25 Eshelman, Doy [name] ([id number]) -------------------------------------------------------------------------------- Vitals Details Patient Name: [name] Date of Service: 11/22/2016 10:30 AM Medical Record Number: [id number] Patient Account Number:  [id number] Date of Birth/Sex: [birth year] (76 y.o. Male) Treating RN: [name] Primary Care Kya Mayfield: [name] Other Clinician: Referring Nilza Eaker: [name] Treating Hamza Empson/Extender: [name] in Treatment: 0 Vital Signs Time Taken: 10:24 Temperature (F): 97.7 Height (in): 70 Pulse (bpm): 72 Source: Stated Respiratory Rate (breaths/min): 16 Weight (lbs): 253 Blood Pressure (mmHg): 90/48 Source: Measured Reference Range: 80 - 120 mg / dl Body Mass Index (BMI): 36.3 Electronic Signature(s) Signed: 11/22/2016 4:31:30 PM By: [name] Entered By: [name] on 11/22/2016 10:25:46

## 2016-11-23 NOTE — Progress Notes (Signed)
MADIX, BLOWE ([id number]) Visit Report for 11/22/2016 Abuse/Suicide Risk Screen Details Patient Name: [name], [name] Date of Service: 11/22/2016 10:30 AM Medical Record Number: [id number] Patient Account Number: [id number] Date of Birth/Sex: [birth year] (76 y.o. Male) Treating RN: Phillis Haggis Primary Care Rylea Selway: Karlene Einstein Other Clinician: Referring Eduardo Wurth: Daryel November Treating Tavion Senkbeil/Extender: Rudene Re in Treatment: 0 Abuse/Suicide Risk Screen Items Answer ABUSE/SUICIDE RISK SCREEN: Has anyone close to you tried to hurt or harm you recentlyo No Do you feel uncomfortable with anyone in your familyo No Has anyone forced you do things that you didnot want to doo No Do you have any thoughts of harming yourselfo No Patient displays signs or symptoms of abuse and/or neglect. No Electronic Signature(s) Signed: 11/22/2016 4:31:30 PM By: Alejandro Mulling Entered By: Alejandro Mulling on 11/22/2016 10:37:10 Chalker, [name] ([id number]) -------------------------------------------------------------------------------- Activities of Daily Living Details Patient Name: [name] Date of Service: 11/22/2016 10:30 AM Medical Record Number: [id number] Patient Account Number: [id number] Date of Birth/Sex: Mar 27, 1941 (76 y.o. Male) Treating RN: Phillis Haggis Primary Care Rider Ermis: Karlene Einstein Other Clinician: Referring Haillee Johann: Daryel November Treating Contessa Preuss/Extender: Rudene Re in Treatment: 0 Activities of Daily Living Items Answer Activities of Daily Living (Please select one for each item) Drive Automobile Not Able Take Medications Not Able Use Telephone Not Able Care for Appearance Not Able Use Toilet Not Able Mady Haagensen / Shower Not Able Dress Self Not Able Feed Self Completely Able Walk Not Able Get In / Out Bed Not Able Housework Not Able Prepare Meals Not Able Handle Money Not Able Shop for Self Not  Able Electronic Signature(s) Signed: 11/22/2016 4:31:30 PM By: Alejandro Mulling Entered By: Alejandro Mulling on 11/22/2016 10:37:37 Mayer, [name] ([id number]) -------------------------------------------------------------------------------- Education Assessment Details Patient Name: [name] Date of Service: 11/22/2016 10:30 AM Medical Record Number: [id number] Patient Account Number: [id number] Date of Birth/Sex: 02-Mar-1941 (76 y.o. Male) Treating RN: Phillis Haggis Primary Care Alveda Vanhorne: Karlene Einstein Other Clinician: Referring Liisa Picone: Daryel November Treating Malaney Mcbean/Extender: Rudene Re in Treatment: 0 Primary Learner Assessed: Patient Learning Preferences/Education Level/Primary Language Learning Preference: Explanation, Printed Material Highest Education Level: College or Above Preferred Language: English Cognitive Barrier Assessment/Beliefs Language Barrier: No Translator Needed: No Memory Deficit: No Emotional Barrier: No Cultural/Religious Beliefs Affecting Medical No Care: Physical Barrier Assessment Impaired Vision: No Impaired Hearing: No Decreased Hand dexterity: No Knowledge/Comprehension Assessment Knowledge Level: Medium Comprehension Level: Medium Ability to understand written Medium instructions: Ability to understand verbal Medium instructions: Motivation Assessment Anxiety Level: Calm Cooperation: Cooperative Education Importance: Acknowledges Need Interest in Health Problems: Asks Questions Perception: Coherent Willingness to Engage in Self- Medium Management Activities: Readiness to Engage in Self- Medium Management Activities: Electronic Signature(s) TARYN, NAVE ([id number]) Signed: 11/22/2016 4:31:30 PM By: Alejandro Mulling Entered By: Alejandro Mulling on 11/22/2016 10:38:08 Nowaczyk, [name] ([id number]) -------------------------------------------------------------------------------- Fall Risk  Assessment Details Patient Name: [name] Date of Service: 11/22/2016 10:30 AM Medical Record Number: [id number] Patient Account Number: [id number] Date of Birth/Sex: 08-17-1940 (76 y.o. Male) Treating RN: Phillis Haggis Primary Care Cloie Wooden: Karlene Einstein Other Clinician: Referring Quantae Martel: Daryel November Treating Jeziah Kretschmer/Extender: Rudene Re in Treatment: 0 Fall Risk Assessment Items Have you had 2 or more falls in the last 12 monthso 0 No Have you had any fall that resulted in injury in the last 12 monthso 0 Yes FALL RISK ASSESSMENT: History of falling - immediate or within 3 months 25 Yes Secondary diagnosis 15 Yes Ambulatory aid None/bed rest/wheelchair/nurse 0 Yes Crutches/cane/walker 0 No  Furniture 0 No IV Access/Saline Lock 0 No Gait/Training Normal/bed rest/immobile 0 No Weak 10 Yes Impaired 20 Yes Mental Status Oriented to own ability 0 No Electronic Signature(s) Signed: 11/22/2016 4:31:30 PM By: Alejandro Mulling Entered By: Alejandro Mulling on 11/22/2016 10:38:47 Alen, [name] ([id number]) -------------------------------------------------------------------------------- Foot Assessment Details Patient Name: [name] Date of Service: 11/22/2016 10:30 AM Medical Record Number: [id number] Patient Account Number: [id number] Date of Birth/Sex: 1940-09-28 (76 y.o. Male) Treating RN: Phillis Haggis Primary Care Cristal Qadir: Karlene Einstein Other Clinician: Referring Chianne Byrns: Daryel November Treating Shalyn Koral/Extender: Rudene Re in Treatment: 0 Foot Assessment Items Site Locations + = Sensation present, - = Sensation absent, C = Callus, U = Ulcer R = Redness, W = Warmth, M = Maceration, PU = Pre-ulcerative lesion F = Fissure, S = Swelling, D = Dryness Assessment Right: Left: Other Deformity: No No Prior Foot Ulcer: No No Prior Amputation: No No Charcot Joint: No No Ambulatory Status:  Non-ambulatory Assistance Device: Wheelchair Gait: Electronic Signature(s) Signed: 11/22/2016 4:31:30 PM By: Alejandro Mulling Entered By: Alejandro Mulling on 11/22/2016 10:40:14 Tropea, [name] ([id number]) -------------------------------------------------------------------------------- Nutrition Risk Assessment Details Patient Name: [name] Date of Service: 11/22/2016 10:30 AM Medical Record Number: [id number] Patient Account Number: [id number] Date of Birth/Sex: 1940-11-21 (76 y.o. Male) Treating RN: Ashok Cordia, Debi Primary Care Tavi Hoogendoorn: Karlene Einstein Other Clinician: Referring Sariya Trickey: Daryel November Treating Aireanna Luellen/Extender: Rudene Re in Treatment: 0 Height (in): 70 Weight (lbs): 253 Body Mass Index (BMI): 36.3 Nutrition Risk Assessment Items NUTRITION RISK SCREEN: I have an illness or condition that made me change the kind and/or 2 Yes amount of food I eat I eat fewer than two meals per day 3 Yes I eat few fruits and vegetables, or milk products 0 No I have three or more drinks of beer, liquor or wine almost every day 0 No I have tooth or mouth problems that make it hard for me to eat 0 No I don't always have enough money to buy the food I need 0 No I eat alone most of the time 0 No I take three or more different prescribed or over-the-counter drugs a 1 Yes day Without wanting to, I have lost or gained 10 pounds in the last six 0 No months I am not always physically able to shop, cook and/or feed myself 0 No Nutrition Protocols Good Risk Protocol Moderate Risk Protocol Electronic Signature(s) Signed: 11/22/2016 4:31:30 PM By: Alejandro Mulling Entered By: Alejandro Mulling on 11/22/2016 10:38:55

## 2016-11-25 ENCOUNTER — Emergency Department
Admission: EM | Admit: 2016-11-25 | Discharge: 2016-11-25 | Discharge status: Against Medical Advice | Payer: Medicare Other | Attending: Emergency Medicine | Admitting: Emergency Medicine

## 2016-11-25 ENCOUNTER — Encounter: Payer: Self-pay | Admitting: Emergency Medicine

## 2016-11-25 DIAGNOSIS — I509 Heart failure, unspecified: Secondary | ICD-10-CM | POA: Insufficient documentation

## 2016-11-25 DIAGNOSIS — I110 Hypertensive heart disease with heart failure: Secondary | ICD-10-CM | POA: Diagnosis not present

## 2016-11-25 DIAGNOSIS — Z7901 Long term (current) use of anticoagulants: Secondary | ICD-10-CM | POA: Diagnosis not present

## 2016-11-25 DIAGNOSIS — Z7982 Long term (current) use of aspirin: Secondary | ICD-10-CM | POA: Insufficient documentation

## 2016-11-25 DIAGNOSIS — E860 Dehydration: Secondary | ICD-10-CM | POA: Diagnosis not present

## 2016-11-25 DIAGNOSIS — D62 Acute posthemorrhagic anemia: Secondary | ICD-10-CM

## 2016-11-25 DIAGNOSIS — J449 Chronic obstructive pulmonary disease, unspecified: Secondary | ICD-10-CM | POA: Diagnosis not present

## 2016-11-25 DIAGNOSIS — K922 Gastrointestinal hemorrhage, unspecified: Secondary | ICD-10-CM | POA: Diagnosis not present

## 2016-11-25 DIAGNOSIS — R195 Other fecal abnormalities: Secondary | ICD-10-CM

## 2016-11-25 DIAGNOSIS — R42 Dizziness and giddiness: Secondary | ICD-10-CM | POA: Diagnosis present

## 2016-11-25 DIAGNOSIS — R319 Hematuria, unspecified: Secondary | ICD-10-CM

## 2016-11-25 DIAGNOSIS — Z79899 Other long term (current) drug therapy: Secondary | ICD-10-CM | POA: Insufficient documentation

## 2016-11-25 DIAGNOSIS — N289 Disorder of kidney and ureter, unspecified: Secondary | ICD-10-CM

## 2016-11-25 LAB — CBC WITH DIFFERENTIAL/PLATELET
Basophils Absolute: 0 10*3/uL (ref 0–0.1)
Basophils Relative: 1 %
EOS ABS: 0 10*3/uL (ref 0–0.7)
EOS PCT: 1 %
HCT: 27.3 % — ABNORMAL LOW (ref 40.0–52.0)
Hemoglobin: 9.1 g/dL — ABNORMAL LOW (ref 13.0–18.0)
LYMPHS ABS: 1.5 10*3/uL (ref 1.0–3.6)
LYMPHS PCT: 25 %
MCH: 28 pg (ref 26.0–34.0)
MCHC: 33.3 g/dL (ref 32.0–36.0)
MCV: 84.3 fL (ref 80.0–100.0)
MONO ABS: 0.5 10*3/uL (ref 0.2–1.0)
Monocytes Relative: 9 %
Neutro Abs: 3.7 10*3/uL (ref 1.4–6.5)
Neutrophils Relative %: 64 %
PLATELETS: 232 10*3/uL (ref 150–440)
RBC: 3.24 MIL/uL — AB (ref 4.40–5.90)
RDW: 16.7 % — AB (ref 11.5–14.5)
WBC: 5.8 10*3/uL (ref 3.8–10.6)

## 2016-11-25 LAB — COMPREHENSIVE METABOLIC PANEL
ALT: 10 U/L — AB (ref 17–63)
ANION GAP: 4 — AB (ref 5–15)
AST: 16 U/L (ref 15–41)
Albumin: 2.4 g/dL — ABNORMAL LOW (ref 3.5–5.0)
Alkaline Phosphatase: 61 U/L (ref 38–126)
BUN: 17 mg/dL (ref 6–20)
CHLORIDE: 113 mmol/L — AB (ref 101–111)
CO2: 21 mmol/L — AB (ref 22–32)
Calcium: 8.1 mg/dL — ABNORMAL LOW (ref 8.9–10.3)
Creatinine, Ser: 1.4 mg/dL — ABNORMAL HIGH (ref 0.61–1.24)
GFR, EST AFRICAN AMERICAN: 56 mL/min — AB (ref >60)
GFR, EST NON AFRICAN AMERICAN: 48 mL/min — AB (ref >60)
Glucose, Bld: 143 mg/dL — ABNORMAL HIGH (ref 65–99)
POTASSIUM: 3.7 mmol/L (ref 3.5–5.1)
SODIUM: 138 mmol/L (ref 135–145)
Total Bilirubin: 0.4 mg/dL (ref 0.3–1.2)
Total Protein: 6.2 g/dL — ABNORMAL LOW (ref 6.5–8.1)

## 2016-11-25 LAB — CREATININE, SERUM
Creatinine, Ser: 1.37 mg/dL — ABNORMAL HIGH (ref 0.61–1.24)
GFR calc Af Amer: 57 mL/min — ABNORMAL LOW (ref >60)
GFR, EST NON AFRICAN AMERICAN: 50 mL/min — AB (ref >60)

## 2016-11-25 LAB — APTT: APTT: 38 s — AB (ref 24–36)

## 2016-11-25 LAB — CBC
HEMATOCRIT: 26 % — AB (ref 40.0–52.0)
Hemoglobin: 8.7 g/dL — ABNORMAL LOW (ref 13.0–18.0)
MCH: 28.1 pg (ref 26.0–34.0)
MCHC: 33.3 g/dL (ref 32.0–36.0)
MCV: 84.4 fL (ref 80.0–100.0)
Platelets: 231 10*3/uL (ref 150–440)
RBC: 3.08 MIL/uL — AB (ref 4.40–5.90)
RDW: 16.6 % — ABNORMAL HIGH (ref 11.5–14.5)
WBC: 6.4 10*3/uL (ref 3.8–10.6)

## 2016-11-25 LAB — TROPONIN I: Troponin I: <0.03 ng/mL (ref <0.03)

## 2016-11-25 LAB — PROTIME-INR
INR: 1.27
PROTHROMBIN TIME: 16 s — AB (ref 11.4–15.2)

## 2016-11-25 LAB — URINALYSIS, COMPLETE (UACMP) WITH MICROSCOPIC
Bilirubin Urine: NEGATIVE
Glucose, UA: NEGATIVE mg/dL
Ketones, ur: NEGATIVE mg/dL
NITRITE: NEGATIVE
PROTEIN: 100 mg/dL — AB
SPECIFIC GRAVITY, URINE: 1.009 (ref 1.005–1.030)
SQUAMOUS EPITHELIAL / LPF: NONE SEEN
pH: 5 (ref 5.0–8.0)

## 2016-11-25 LAB — TYPE AND SCREEN
ABO/RH(D): O POS
ANTIBODY SCREEN: NEGATIVE

## 2016-11-25 MED ORDER — SODIUM CHLORIDE 0.9 % IV BOLUS (SEPSIS)
500.0000 mL | Freq: Once | INTRAVENOUS | Status: AC
  Administered 2016-11-25: 500 mL via INTRAVENOUS

## 2016-11-25 MED ORDER — SODIUM CHLORIDE 0.9 % IV SOLN
INTRAVENOUS | Status: DC
Start: 2016-11-25 — End: 2016-11-25

## 2016-11-25 MED ORDER — SODIUM CHLORIDE 0.9 % IV SOLN: INTRAVENOUS | Status: DC

## 2016-11-25 NOTE — ED Notes (Addendum)
Pt arrived with foley in. Pt also arrived with loose stool in diaper. Redness to bottom with some mild breakdown. Cleaned pt.

## 2016-11-25 NOTE — Final Consult Note (Addendum)
[facility] - [place] at [facility]   PATIENT NAME: [name]    MR#:  [id number]  DATE OF BIRTH:  [birth year]  DATE OF ADMISSION:  11/25/2016  PRIMARY CARE PHYSICIAN: Angela Cox, MD   REQUESTING/REFERRING PHYSICIAN: Dr. Scotty Court  CHIEF COMPLAINT:   Chief Complaint  Patient presents with  . Anemia    HISTORY OF PRESENT ILLNESS:  [name]  is a 76 y.o. male with a known history of CHF, COPD, DVT, for which patient is on anticoagulation with Eliquis, gastroesophageal reflux disease, hypertension, hyperlipidemia, schizophrenia, bipolar disorder, seizure disorder, who presents to the hospital due to concerns of anemia for possible blood transfusion. Patient denies any bleeding, denies abdominal pain, admits of dizziness, generalized weakness, some shortness of breath. He was noted to have hematuria about a week ago after fall, he also had larger right lower extremity laceration. A week ago. His labs reveal hemoglobin level of 10.3, 9.1 today. Hospitalist services were contacted for consultation, possible admission. Patient was noted to have Hemoccult-positive stool in the emergency room, but no active bleeding. We are planning to repeat patient's urine culture since prior cultures revealed multiple organisms, however, no urine was available after Foley bag was removed, IV fluids were administered and small amount of urine was obtained, due to concerns of poor urinary output, admission was felt was appropriate, however, patient decided to leave against medical advise.  PAST MEDICAL HISTORY:   Past Medical History:  Diagnosis Date  . Bipolar 1 disorder (HCC)   . CHF (congestive heart failure) (HCC)   . COPD (chronic obstructive pulmonary disease) (HCC)   . DVT (deep venous thrombosis) (HCC)   . GERD (gastroesophageal reflux disease)   . High cholesterol   . Hypertension   . Schizophrenia (HCC)   . Seizure (HCC)     PAST SURGICAL  HISTOIRY:   Past Surgical History:  Procedure Laterality Date  . NO PAST SURGERIES      SOCIAL HISTORY:   Social History  Substance Use Topics  . Smoking status: Never Smoker  . Smokeless tobacco: Never Used  . Alcohol use No    FAMILY HISTORY:   Family History  Problem Relation Age of Onset  . Healthy Mother   . Prostate cancer Neg Hx   . Kidney disease Neg Hx   . Kidney cancer Neg Hx   . Bladder Cancer Neg Hx     DRUG ALLERGIES:   Allergies  Allergen Reactions  . Penicillin G Other (See Comments)    Convulsions. Has patient had a PCN reaction causing immediate rash, facial/tongue/throat swelling, SOB or lightheadedness with hypotension: not sure Has patient had a PCN reaction causing severe rash involving mucus membranes or skin necrosis: not sure Has patient had a PCN reaction that required hospitalization: not sure Has patient had a PCN reaction occurring within the last 10 years: not sure If all of the above answers are "NO", then may proceed with Cephalosporin use.    REVIEW OF SYSTEMS:  Denies any pain, unable to get review of systems due to dementia  MEDICATIONS AT HOME:   Prior to Admission medications   Medication Sig Start Date End Date Taking? Authorizing Provider  acetaminophen (TYLENOL) 325 MG tablet Take 2 tablets (650 mg total) by mouth every 6 (six) hours as needed. 04/26/16  Yes Alford Highland, MD  albuterol (VENTOLIN HFA) 108 (90 Base) MCG/ACT inhaler Inhale 2 puffs into the lungs every 6 (six) hours as needed for shortness  of breath.   Yes Historical Provider, MD  amLODipine (NORVASC) 2.5 MG tablet Take 1 tablet (2.5 mg total) by mouth daily. 04/26/16  Yes Alford Highland, MD  aspirin 81 MG chewable tablet Chew 81 mg by mouth daily.   Yes Historical Provider, MD  buPROPion (BUDEPRION XL) 300 MG 24 hr tablet Take 1 tablet (300 mg total) by mouth daily. 04/26/16  Yes Alford Highland, MD  carvedilol (COREG) 25 MG tablet Take 1 tablet (25 mg total) by  mouth 2 (two) times daily. 04/26/16  Yes Richard Renae Gloss, MD  cephALEXin (KEFLEX) 500 MG capsule Take 1 capsule (500 mg total) by mouth 4 (four) times daily. 11/18/16 11/28/16 Yes Emily Filbert, MD  cholecalciferol (VITAMIN D) 1000 units tablet Take 1,000 Units by mouth daily.   Yes Historical Provider, MD  finasteride (PROSCAR) 5 MG tablet Take 1 tablet (5 mg total) by mouth daily. 04/27/16  Yes Richard Renae Gloss, MD  furosemide (LASIX) 20 MG tablet Take 20 mg by mouth daily.   Yes Historical Provider, MD  loperamide (IMODIUM A-D) 2 MG tablet Take 2-4 mg by mouth as needed for diarrhea or loose stools. 4 mg at first episode of diarrhea, then 2 mg for each subsequent episode as needed   Yes Historical Provider, MD  omeprazole (PRILOSEC) 20 MG capsule Take 2 capsules (40 mg total) by mouth daily. Patient taking differently: Take 40 mg by mouth at bedtime.  04/26/16  Yes Richard Renae Gloss, MD  PHENobarbital (LUMINAL) 64.8 MG tablet Take 2 tablets (129.6 mg total) by mouth every other day. 04/26/16  Yes Richard Renae Gloss, MD  polyethylene glycol (MIRALAX / GLYCOLAX) packet Take 17 g by mouth daily as needed for moderate constipation. 04/26/16  Yes Richard Renae Gloss, MD  potassium chloride SA (K-DUR,KLOR-CON) 20 MEQ tablet Take 30 mEq by mouth 2 (two) times daily.   Yes Historical Provider, MD  pravastatin (PRAVACHOL) 40 MG tablet Take 1 tablet (40 mg total) by mouth daily. 04/26/16  Yes Alford Highland, MD  tamsulosin (FLOMAX) 0.4 MG CAPS capsule Take 1 capsule (0.4 mg total) by mouth daily. 06/16/16  Yes Shannon A McGowan, PA-C  traMADol (ULTRAM) 50 MG tablet Take 1 tablet (50 mg total) by mouth every 6 (six) hours as needed for moderate pain or severe pain. 09/24/16  Yes Ramonita Lab, MD  apixaban (ELIQUIS) 5 MG TABS tablet Take 1 tablet (5 mg total) by mouth 2 (two) times daily. Patient not taking: Reported on 11/25/2016 05/31/16   Katharina Caper, MD  ARIPiprazole (ABILIFY) 20 MG tablet Take 1 tablet (20 mg total)  by mouth daily. Patient not taking: Reported on 11/25/2016 04/26/16   Alford Highland, MD      VITAL SIGNS:  Blood pressure 120/61, pulse 72, temperature 98.3 F (36.8 C), temperature source Oral, resp. rate 12, height  (1.778 m), weight 114.8 kg (253 lb), SpO2 98 %.  PHYSICAL EXAMINATION:  GENERAL:  76 y.o.-year-old patient lying in the bed with no acute distress.  EYES: Pupils equal, round, reactive to light and accommodation. No scleral icterus. Extraocular muscles intact.  HEENT: Head atraumatic, normocephalic. Oropharynx and nasopharynx clear.  NECK:  Supple, no jugular venous distention. No thyroid enlargement, no tenderness.  LUNGS: Diminished breath sounds bilaterally, no wheezing, rales,rhonchi or crepitation. No use of accessory muscles of respiration.  CARDIOVASCULAR: S1, S2 normal. No murmurs, rubs, or gallops.  ABDOMEN: Soft, nontender, nondistended. Bowel sounds present. No organomegaly or mass. Foley catheter is in with some bloody sediment in the tubing,  urine is yellow, no frank blood EXTREMITIES: 1+ lower extremity and pedal edema, some Tenderness on palpation, Kerlix dressing on the right lower extremity, no cyanosis, or clubbing. On the left.  NEUROLOGIC: Cranial nerves II through XII are intact. Muscle strength 5/5 in all extremities. Sensation intact. Gait not checked.  PSYCHIATRIC: The patient is alert , but not oriented.  SKIN: No obvious rash, lesion, or ulcer.   LABORATORY PANEL:   CBC  Recent Labs Lab 11/25/16 1341  WBC 5.8  HGB 9.1*  HCT 27.3*  PLT 232   ------------------------------------------------------------------------------------------------------------------  Chemistries   Recent Labs Lab 11/25/16 1341  NA 138  K 3.7  CL 113*  CO2 21*  GLUCOSE 143*  BUN 17  CREATININE 1.40*  CALCIUM 8.1*  AST 16  ALT 10*  ALKPHOS 61  BILITOT 0.4    ------------------------------------------------------------------------------------------------------------------  Cardiac Enzymes  Recent Labs Lab 11/25/16 1341  TROPONINI <0.03   ------------------------------------------------------------------------------------------------------------------  RADIOLOGY:  No results found.  EKG:   Orders placed or performed during the hospital encounter of 11/25/16  . EKG 12-Lead  . EKG 12-Lead  . ED EKG  . ED EKG    IMPRESSION AND PLAN:    Active Problems:   Acute posthemorrhagic anemia   Hematuria   Guaiac positive stools   Acute renal insufficiency   #1. Acute posthemorrhagic anemia with about 1 g hemoglobin drop since about a week ago, suspected due to hematuria, also possibly chronic GI bleed, since patient was noted to have Hemoccult-positive stool. Hematuria has subsided. I recommend to suspend Eliquis for about one week until urine clears, follow hemoglobin level in about a week or earlier if any bleeding. Patient does not qualify for blood transfusion at this time #2. Hematuria, questionable urinary tract infection, patient is relatively asymptomatic, get urine cultures, not initiating antibiotic therapy at this time yet, until cultures are known, suspending Eliquis for about one week until urine clears #3. Hemoccult positive stool, no active bleeding, hold Eliquis, continue Prilosec at 40 mg daily dose, may benefit from gastroenterologist evaluation as outpatient #4 acute renal insufficiency, patient was given IV fluids in the  emergency room, recheck creatinine again, get bladder scan, It's possible that the patient's Foley catheter was partially occluded by a clot that impaired urinary output. It is recommended to follow patient's creatinine as outpatient closely #5. Decreased urinary output, IV fluids were administered while in the emergency room, small amount of urine was obtained, it was felt that patient was dehydrated and  needs continuous infusion of IV fluids and admission to the hospital, however, the patient decided to  leave AGAINST MEDICAL ADVICE and refused admission, despite previous from nursing staff and physicians.    All the records are reviewed and case discussed with Consulting provider. Management plans discussed with the patient, family and they are in agreement.  CODE STATUS: Full code  TOTAL TIME TAKING CARE OF THIS PATIENT: 50 minutes.  Discussed with Dr. Scotty Court and nursing staff  Lerry Cordrey M.D on 11/25/2016 at 3:00 PM  Between 7am to 6pm - Pager - [phone]  After 6pm go to [email or web address] - password [email or web address] [facility]  [place]  Hospitalists  Office  [phone]  CC: Primary care Physician: Angela Cox, MD

## 2016-11-25 NOTE — ED Notes (Signed)
Report called to kristine at [facility]. No questions regarding report.

## 2016-11-25 NOTE — ED Notes (Signed)
Pt wanting to leave. Dr Scotty Court notified

## 2016-11-25 NOTE — ED Notes (Signed)
Pt wanted IV out now. Removed. Dr Scotty Court aware

## 2016-11-25 NOTE — ED Notes (Signed)
Waiting on ambulance. NAD.

## 2016-11-25 NOTE — Discharge Instructions (Signed)
Hemoglobin today was 9 which does not require transfusion based on your vital signs and symptoms. Follow-up with your doctor for further monitoring. You may need referral to a gastroenterologist due to a low level of blood in your stool. Increase your fluid intake as you do appear to be dehydrated. We planned to hospitalize you but you refused.

## 2016-11-25 NOTE — ED Provider Notes (Addendum)
[facility] Emergency Department Provider Note  ____________________________________________  Time seen: Approximately 2:24 PM  I have reviewed the triage vital signs and the nursing notes.   HISTORY  Chief Complaint Anemia Level 5 caveat:  Portions of the history and physical were unable to be obtained due to the patient's schizophrenia and poor historian  HPI [name] is a 76 y.o. male sent to the ED by primary care doctor for transfusion. Patient denies any acute bleeding or change in his bowel habits. He does take iron. Denies any pain but does report dizziness and generalized weakness and shortness of breath.     Past Medical History:  Diagnosis Date  . Bipolar 1 disorder (HCC)   . CHF (congestive heart failure) (HCC)   . COPD (chronic obstructive pulmonary disease) (HCC)   . DVT (deep venous thrombosis) (HCC)   . GERD (gastroesophageal reflux disease)   . High cholesterol   . Hypertension   . Schizophrenia (HCC)   . Seizure [facility])      Patient Active Problem List   Diagnosis Date Noted  . Acute posthemorrhagic anemia 11/25/2016  . Hematuria 11/25/2016  . Guaiac positive stools 11/25/2016  . Acute renal insufficiency 11/25/2016  . Pressure injury of skin 09/23/2016  . Back pain 05/28/2016  . Sepsis (HCC) 05/27/2016  . UTI (urinary tract infection) 05/27/2016  . Escherichia coli (E. coli) infection 05/27/2016  . DVT of lower extremity, bilateral (HCC) 05/27/2016  . 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 BPH (benign prostatic hyperplasia) 05/27/2016  . Acute on chronic renal failure (HCC) 05/27/2016  . Bipolar disorder (HCC) 05/27/2016  . Generalized weakness 05/27/2016  . 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 C. difficile colitis 05/27/2016  . Acute kidney injury (HCC) 05/21/2016  . UTI (lower urinary tract infection) 04/23/2016  . 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  Pneumonia 02/21/2015  . ARF (acute renal failure) (HCC) 02/21/2015  . Hypokalemia 02/21/2015  . Leukocytosis 02/21/2015  . Venous insufficiency of both lower extremities 12/04/2014  . Benign essential hypertension 11/25/2014  . Bilateral carotid artery stenosis 11/15/2014  . Bilateral leg edema 11/15/2014  . COPD (chronic obstructive pulmonary disease) (HCC) 11/15/2014  . Gastroesophageal reflux disease 11/15/2014  . Mild aortic valve stenosis 11/15/2014  . Mixed hyperlipidemia 11/15/2014  . Obesity 11/15/2014  . Seizure disorder (HCC) 11/15/2014     Past Surgical History:  Procedure Laterality Date  . NO PAST SURGERIES       Prior to Admission medications   Medication Sig Start Date End Date Taking? Authorizing Provider  acetaminophen (TYLENOL) 325 MG tablet Take 2 tablets (650 mg total) by mouth every 6 (six) hours as needed. 04/26/16  Yes Alford Highland, MD  albuterol (VENTOLIN HFA) 108 (90 Base) MCG/ACT inhaler Inhale 2 puffs into the lungs every 6 (six) hours as needed for shortness of breath.   Yes Historical Provider, MD  amLODipine (NORVASC) 2.5 MG tablet Take 1 tablet (2.5 mg total) by mouth daily. 04/26/16  Yes Alford Highland, MD  aspirin 81 MG chewable tablet Chew 81 mg by mouth daily.   Yes Historical Provider, MD  buPROPion (BUDEPRION XL) 300 MG 24 hr tablet Take 1 tablet (300 mg total) by mouth daily. 04/26/16  Yes Alford Highland, MD  carvedilol (COREG) 25 MG tablet Take 1 tablet (25 mg total) by mouth 2 (two) times daily. 04/26/16  Yes Alford Highland, MD  cephALEXin (KEFLEX) 500 MG capsule Take  1 capsule (500 mg total) by mouth 4 (four) times daily. 11/18/16 11/28/16 Yes Emily Filbert, MD  cholecalciferol (VITAMIN D) 1000 units tablet Take 1,000 Units by mouth daily.   Yes Historical Provider, MD  finasteride (PROSCAR) 5 MG tablet Take 1 tablet (5 mg total) by mouth daily. 04/27/16  Yes Richard Renae Gloss, MD  furosemide (LASIX) 20 MG tablet Take 20 mg by mouth daily.    Yes Historical Provider, MD  loperamide (IMODIUM A-D) 2 MG tablet Take 2-4 mg by mouth as needed for diarrhea or loose stools. 4 mg at first episode of diarrhea, then 2 mg for each subsequent episode as needed   Yes Historical Provider, MD  omeprazole (PRILOSEC) 20 MG capsule Take 2 capsules (40 mg total) by mouth daily. Patient taking differently: Take 40 mg by mouth at bedtime.  04/26/16  Yes Richard Renae Gloss, MD  PHENobarbital (LUMINAL) 64.8 MG tablet Take 2 tablets (129.6 mg total) by mouth every other day. 04/26/16  Yes Richard Renae Gloss, MD  polyethylene glycol (MIRALAX / GLYCOLAX) packet Take 17 g by mouth daily as needed for moderate constipation. 04/26/16  Yes Richard Renae Gloss, MD  potassium chloride SA (K-DUR,KLOR-CON) 20 MEQ tablet Take 30 mEq by mouth 2 (two) times daily.   Yes Historical Provider, MD  pravastatin (PRAVACHOL) 40 MG tablet Take 1 tablet (40 mg total) by mouth daily. 04/26/16  Yes Alford Highland, MD  tamsulosin (FLOMAX) 0.4 MG CAPS capsule Take 1 capsule (0.4 mg total) by mouth daily. 06/16/16  Yes Shannon A McGowan, PA-C  traMADol (ULTRAM) 50 MG tablet Take 1 tablet (50 mg total) by mouth every 6 (six) hours as needed for moderate pain or severe pain. 09/24/16  Yes Ramonita Lab, MD  apixaban (ELIQUIS) 5 MG TABS tablet Take 1 tablet (5 mg total) by mouth 2 (two) times daily. Patient not taking: Reported on 11/25/2016 05/31/16   Katharina Caper, MD  ARIPiprazole (ABILIFY) 20 MG tablet Take 1 tablet (20 mg total) by mouth daily. Patient not taking: Reported on 11/25/2016 04/26/16   Alford Highland, MD     Allergies Penicillin g   Family History  Problem Relation Age of Onset  . Healthy Mother   . Prostate cancer Neg Hx   . Kidney disease Neg Hx   . Kidney cancer Neg Hx   . Bladder Cancer Neg Hx     Social History Social History  Substance Use Topics  . Smoking status: Never Smoker  . Smokeless tobacco: Never Used  . Alcohol use No    Review of Systems Unable to  reliably obtain due to schizophrenia and poor historian  10-point ROS otherwise negative.  ____________________________________________   PHYSICAL EXAM:  VITAL SIGNS: ED Triage Vitals  Enc Vitals Group     BP 11/25/16 1331 120/61     Pulse Rate 11/25/16 1331 72     Resp 11/25/16 1331 12     Temp 11/25/16 1331 98.3 F (36.8 C)     Temp Source 11/25/16 1331 Oral     SpO2 11/25/16 1331 98 %     Weight 11/25/16 1327 253 lb (114.8 kg)     Height 11/25/16 1327  (1.778 m)     Head Circumference --      Peak Flow --      Pain Score --      Pain Loc --      Pain Edu? --      Excl. in GC? --     Vital signs  reviewed, nursing assessments reviewed.   Constitutional:   Alert and orientedTo person and place. Not in distress. Eyes:   No scleral icterus. No conjunctival pallor. PERRL. EOMI.  No nystagmus. ENT   Head:   Normocephalic and atraumatic.   Nose:   No congestion/rhinnorhea. No septal hematoma   Mouth/Throat:   Dry mucous membranes, no pharyngeal erythema. No peritonsillar mass.    Neck:   No stridor. No SubQ emphysema. No meningismus. Hematological/Lymphatic/Immunilogical:   No cervical lymphadenopathy. Cardiovascular:   RRR. Symmetric bilateral radial and DP pulses.  No murmurs.  Respiratory:   Normal respiratory effort without tachypnea nor retractions. Breath sounds are clear and equal bilaterally. No wheezes/rales/rhonchi. Gastrointestinal:   Soft and nontender. Non distended. There is no CVA tenderness.  No rebound, rigidity, or guarding. Rectal exam performed with nurse Vikki Ports at bedside. No hemorrhoids. Brown stool. Hemoccult positive. Patient has been incontinent of stool. There is a stage II sacral decubitus ulcer Genitourinary:   deferred Musculoskeletal:   Normal range of motion in all extremities. No joint effusions.  No lower extremity tenderness.  Chronic discoloration related to likely venous stasis disease. Symmetric Circumference. No drainage.  No induration tenderness or warmth. Neurologic:   Normal speech.  CN 2-10 normal. Motor grossly intact. No gross focal neurologic deficits are appreciated.  Skin:    Skin is warm, dry . No rash noted.  No petechiae, purpura, or bullae.  ____________________________________________    LABS (pertinent positives/negatives) (all labs ordered are listed, but only abnormal results are displayed) Labs Reviewed  CBC WITH DIFFERENTIAL/PLATELET - Abnormal; Notable for the following:       Result Value   RBC 3.24 (*)    Hemoglobin 9.1 (*)    HCT 27.3 (*)    RDW 16.7 (*)    All other components within normal limits  COMPREHENSIVE METABOLIC PANEL - Abnormal; Notable for the following:    Chloride 113 (*)    CO2 21 (*)    Glucose, Bld 143 (*)    Creatinine, Ser 1.40 (*)    Calcium 8.1 (*)    Total Protein 6.2 (*)    Albumin 2.4 (*)    ALT 10 (*)    GFR calc non Af Amer 48 (*)    GFR calc Af Amer 56 (*)    Anion gap 4 (*)    All other components within normal limits  PROTIME-INR - Abnormal; Notable for the following:    Prothrombin Time 16.0 (*)    All other components within normal limits  APTT - Abnormal; Notable for the following:    aPTT 38 (*)    All other components within normal limits  CBC - Abnormal; Notable for the following:    RBC 3.08 (*)    Hemoglobin 8.7 (*)    HCT 26.0 (*)    RDW 16.6 (*)    All other components within normal limits  URINE CULTURE  TROPONIN I  URINALYSIS, COMPLETE (UACMP) WITH MICROSCOPIC  TYPE AND SCREEN   ____________________________________________   EKG  Interpreted by me Normal sinus rhythm rate of 72, normal axis and intervals. Normal QRS ST segments and T waves.  ____________________________________________    RADIOLOGY  No results found.  ____________________________________________   PROCEDURES Procedures  ____________________________________________   INITIAL IMPRESSION / ASSESSMENT AND PLAN / ED  COURSE  Pertinent labs & imaging results that were available during my care of the patient were reviewed by me and considered in my medical decision making (see chart for  details).  ----------------------------------------- 2:36 PM on 11/25/2016 -----------------------------------------  Patient presents with down trending hemoglobin, from 10.3 to 9.1 over the past week. He is Hemoccult positive indicating GI bleed. He is on Eliquis. Discussed with the hospitalist for further management.     Clinical Course as of Nov 26 1715  Thu Nov 25, 2016  1344 P/w gen. Weakness. Hemoccult positive. Will f/u labs.   [PS]  1500 D/w Dr. Alen Blew, feels pt does not require admission at this time, feels pt hemodynamically stable. Rec. Stop eliquis, change foley collection bag, resend urine culture.  Feels hb drop likely due to hematuria, despite hemoccult positive stools. Will change collection bag, recheck CBC at 5pm to ensure stable Hb for outpatient follow up.  [PS]  1705 On repeat assessment, pt not making adequate UOP despite fluid challenge. D/w Dr. Alen Blew, who wants to admit the patient. Concern for dehydration. Serial h/h stable. VSS. Informed pt of need to hospitalize for further h/h monitoring and renal/hydration assessment. Pt refuses, insists on discharge since he doesn't need a blood transfusion. AOx4, linear thought, has coherent future plan on residence and follow up with PCP.   [PS]    Clinical Course User Index [PS] Sharman Cheek, MD     ----------------------------------------- 5:12 PM on 11/25/2016 -----------------------------------------  Patient will be discharged AGAINST MEDICAL ADVICE. He does appear to have medical decision-making capacity    ____________________________________________   FINAL CLINICAL IMPRESSION(S) / ED DIAGNOSES  Final diagnoses:  Gastrointestinal hemorrhage, unspecified gastrointestinal hemorrhage type  Anticoagulant long-term use   Dehydration      New Prescriptions   No medications on file     Portions of this note were generated with dragon dictation software. Dictation errors may occur despite best attempts at proofreading.    Sharman Cheek, MD 11/25/16 1437    Sharman Cheek, MD 11/25/16 [phone]

## 2016-11-25 NOTE — ED Notes (Signed)
Pt sent from [facility] for blood transfusion. Unsure pts hgb. NAD. Pt does take iron

## 2016-11-25 NOTE — ED Notes (Signed)
No urine in new bag at this time

## 2016-11-25 NOTE — ED Notes (Signed)
Lights dimmed for pt comfort. NAD. No needs at this time

## 2016-11-25 NOTE — ED Notes (Signed)
Foley bag changed. Will obtain urine sample from new bag.

## 2016-11-25 NOTE — ED Triage Notes (Signed)
Pt sent for blood transfusion. A doctor in [place] ordered 2 units but pt was unable to get them so sent to ED. From [facility]. Has had SHOB X 6 months. Some weakness and fatigue

## 2016-11-27 LAB — URINE CULTURE: Culture: NO GROWTH

## 2016-11-29 ENCOUNTER — Encounter: Payer: Medicare Other | Admitting: Surgery

## 2016-11-29 DIAGNOSIS — L97212 Non-pressure chronic ulcer of right calf with fat layer exposed: Secondary | ICD-10-CM | POA: Diagnosis not present

## 2016-11-29 NOTE — Progress Notes (Addendum)
[name], [name] ([id number]) Visit Report for 11/29/2016 Chief Complaint Document Details Patient Name: [name], [name] Date of Service: 11/29/2016 9:45 AM Medical Record Number: [id number] Patient Account Number: [id number] Date of Birth/Sex: 09-21-1940 (76 y.o. Male) Treating RN: Phillis Haggis Primary Care Provider: Karlene Einstein Other Clinician: Referring Provider: Daryel November Treating Provider/Extender: Rudene Re in Treatment: 1 Information Obtained from: Patient Chief Complaint Patient presents to the wound care center for a consult due non healing wound. The non-healing wound of the right lower extremity which has been present for 4 days. Electronic Signature(s) Signed: 11/29/2016 9:58:49 AM By: Evlyn Kanner MD, FACS Entered By: Evlyn Kanner on 11/29/2016 09:58:49 Blucher, [name] ([id number]) -------------------------------------------------------------------------------- HPI Details Patient Name: [name] Date of Service: 11/29/2016 9:45 AM Medical Record Number: [id number] Patient Account Number: [id number] Date of Birth/Sex: Jun 27, 1941 (76 y.o. Male) Treating RN: Phillis Haggis Primary Care Provider: Karlene Einstein Other Clinician: Referring Provider: Daryel November Treating Provider/Extender: Rudene Re in Treatment: 1 History of Present Illness Location: right lower extremity wound Quality: Patient reports experiencing a dull pain to affected area(s). Severity: Patient states wound are getting worse. Duration: Patient has had the wound for 4 days prior to presenting for treatment Timing: Pain in wound is Intermittent (comes and goes Context: The wound occurred when the patient had a fall and cut his leg which required several sutures and staples. Modifying Factors: Consults to this date include:he has had bilateral lower limb lymphedema and has been using lymphedema pumps. Associated Signs and Symptoms: Patient  reports having difficulty standing for long periods. HPI Description: 76 year old male was recently seen in the ER on 11/18/16, due to a fall and had a large right lower leg laceration. past medical history is significant for bipolar disorder, CHF, COPD, DVT, schizophrenia and seizure disorder. In the past he has also been treated by the vascular surgeons for lymphedema and was recommended compression stockings and lymphedema pump. In the ER a right leg x-ray showed soft tissue swelling without any acute bony abnormality and there was demineralization with degenerative changes of the right knee. he had a repair of his wound and his anticoagulation was held for 24 hours. He was asked to follow-up with the wound care department. ER notes the length of the wound was 30 cm and the depth was 10 mm. 3-0 nylon horizontal mattress sutures and multiple staples were used to close the wound. he was prescribed Keflex by the ER. Of note the patient does not have a follow-up appointment at the ER and his care has been transferred over to our wound center ======== Old notes: 76 year old gentleman who has a history of seizures and schizophrenia with bipolar disorder enhances in a assisted living facility. He also has CHF, COPD, GERD and remote history of DVT. He has been treated by the vascular group at [facility] for bilateral lymphedema and has been using lymphedema pumps. This problem started on September 30 when he had a lacerated wound after a fall. The staples were left in for about 10 days and when the staples were removed the wound opened out. Since then he's had several issues with wound infection and the most recent culture showed a heavy growth of Pseudomonas aeruginosa and Staphylococcus aureus and he has received clindamycin and the most recent past and from today he is on levofloxacin. Addendum:Notes were reviewed from a previous vascular consult at [facility] by Dr. Gilda Crease who saw  him on 05/27/2014. He had recommended graduated compression stockings class  I and also added lymph pumps to improve the control of the patient's lymphedema. 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 Diffuse soft-tissue swelling correlate with bone scan recommended. [name], [name] ([id number]) 07/15/2015 -- he is still awaiting insurance clearance for his MRI and other than that he has no other changes. 07/21/2015 -- he now has the insurance clearance and his MRI has been scheduled for December 28th. 08/11/2014 -- the MRI of his left lower extremity showed there was no bone destruction or periosteal reaction and there was a soft tissue wound along the lateral aspect of the distal left lower leg with skin thickening but there was no drainable fluid collection to suggest an abscess. ======= Electronic Signature(s) Signed: 11/29/2016 9:59:06 AM By: Evlyn Kanner MD, FACS Entered By: Evlyn Kanner on 11/29/2016 09:59:06 [name], [name] ([id number]) -------------------------------------------------------------------------------- Physical Exam Details Patient Name: [name] Date of Service: 11/29/2016 9:45 AM Medical Record Number: [id number] Patient Account Number: [id number] Date of Birth/Sex: [birth year] (76 y.o. Male) Treating RN: Phillis Haggis Primary Care Provider: Karlene Einstein Other Clinician: Referring Provider: Daryel November Treating Provider/Extender: Rudene Re in Treatment: 1 Constitutional . Pulse regular. Respirations normal and unlabored. Afebrile. . Eyes Nonicteric. Reactive to light. Ears, Nose, Mouth, and Throat Lips, teeth, and gums WNL.Marland Kitchen Moist mucosa without lesions. Neck supple and nontender. No palpable supraclavicular or cervical adenopathy. Normal sized without goiter. Respiratory WNL. No retractions.. Breath sounds WNL, No  rubs, rales, rhonchi, or wheeze.. Cardiovascular Heart rhythm and rate regular, no murmur or gallop.. Pedal Pulses WNL. No clubbing, cyanosis or edema. Chest Breasts symmetical and no nipple discharge.. Breast tissue WNL, no masses, lumps, or tenderness.. Lymphatic No adneopathy. No adenopathy. No adenopathy. Musculoskeletal Adexa without tenderness or enlargement.. Digits and nails w/o clubbing, cyanosis, infection, petechiae, ischemia, or inflammatory conditions.. Integumentary (Hair, Skin) No suspicious lesions. No crepitus or fluctuance. No peri-wound warmth or erythema. No masses.Marland Kitchen Psychiatric Judgement and insight Intact.. No evidence of depression, anxiety, or agitation.. Notes the lymphedema is better controlled and there is no evidence of gaping of the wound in the areas where sutures and staples have been placed. There is minimal oozing from the wound edges and overall the skin is viable. Electronic Signature(s) Signed: 11/29/2016 9:59:39 AM By: Evlyn Kanner MD, FACS Entered By: Evlyn Kanner on 11/29/2016 09:59:38 [name], [name] ([id number]) -------------------------------------------------------------------------------- Physician Orders Details Patient Name: [name] Date of Service: 11/29/2016 9:45 AM Medical Record Number: [id number] Patient Account Number: [id number] Date of Birth/Sex: [birth year] (76 y.o. Male) Treating RN: Phillis Haggis Primary Care Provider: Karlene Einstein Other Clinician: Referring Provider: Daryel November Treating Provider/Extender: Rudene Re in Treatment: 1 Verbal / Phone Orders: Yes Clinician: Pinkerton, Debi Read Back and Verified: Yes Diagnosis Coding Wound Cleansing Wound #2 Right,Proximal,Anterior Lower Leg o Clean wound with Normal Saline. Wound #4 Right,Anterior Knee o Clean wound with Normal Saline. Wound #3 Right,Distal Lower Leg o Clean wound with Normal Saline. Anesthetic Wound #2  Right,Proximal,Anterior Lower Leg o Topical Lidocaine 4% cream applied to wound bed prior to debridement - for clinic use Wound #4 Right,Anterior Knee o Topical Lidocaine 4% cream applied to wound bed prior to debridement - for clinic use Wound #3 Right,Distal Lower Leg o Topical Lidocaine 4% cream applied to wound bed prior to debridement - for clinic use Skin Barriers/Peri-Wound Care Wound #4 Right,Anterior Knee o Skin Prep Primary Wound Dressing Wound #4 Right,Anterior Knee o  Aquacel Ag - or equivalent to Secondary Dressing Wound #2 Right,Proximal,Anterior Lower Leg o ABD pad o Dry Gauze o Non-adherent pad - Telfa Wound #3 Right,Distal Lower Leg o ABD pad [name], [name] ([id number]) o Dry Gauze o Non-adherent pad - Telfa Wound #4 Right,Anterior Knee o Boardered Foam Dressing Dressing Change Frequency Wound #2 Right,Proximal,Anterior Lower Leg o Three times weekly - HHRN to change on Wednesdays and Fridays and pt to come have it changed at wound care center on Mondays. Wound #4 Right,Anterior Knee o Three times weekly - HHRN to change on Wednesdays and Fridays and pt to come have it changed at wound care center on Mondays. Wound #3 Right,Distal Lower Leg o Three times weekly - HHRN to change on Wednesdays and Fridays and pt to come have it changed at wound care center on Mondays. Follow-up Appointments Wound #2 Right,Proximal,Anterior Lower Leg o Return Appointment in 1 week. Wound #4 Right,Anterior Knee o Return Appointment in 1 week. Wound #3 Right,Distal Lower Leg o Return Appointment in 1 week. Edema Control Wound #2 Right,Proximal,Anterior Lower Leg o Kerlix and Coban - Right Lower Extremity o Elevate legs to the level of the heart and pump ankles as often as possible Wound #3 Right,Distal Lower Leg o Kerlix and Coban - Right Lower Extremity o Elevate legs to the level of the heart and pump ankles as often as  possible Additional Orders / Instructions Wound #2 Right,Proximal,Anterior Lower Leg o Increase protein intake. Wound #4 Right,Anterior Knee o Increase protein intake. Wound #3 Right,Distal Lower Leg o Increase protein intake. DESMAN, POLAK ([id number]) Home Health Wound #2 Right,Proximal,Anterior Lower Leg o Continue Home Health Visits - [facility] o Home Health Nurse may visit PRN to address patientos wound care needs. o FACE TO FACE ENCOUNTER: MEDICARE and MEDICAID PATIENTS: I certify that this patient is under my care and that I had a face-to-face encounter that meets the physician face-to-face encounter requirements with this patient on this date. The encounter with the patient was in whole or in part for the following MEDICAL CONDITION: (primary reason for Home Healthcare) MEDICAL NECESSITY: I certify, that based on my findings, NURSING services are a medically necessary home health service. HOME BOUND STATUS: I certify that my clinical findings support that this patient is homebound (i.e., Due to illness or injury, pt requires aid of supportive devices such as crutches, cane, wheelchairs, walkers, the use of special transportation or the assistance of another person to leave their place of residence. There is a normal inability to leave the home and doing so requires considerable and taxing effort. Other absences are for medical reasons / religious services and are infrequent or of short duration when for other reasons). o If current dressing causes regression in wound condition, may D/C ordered dressing product/s and apply Normal Saline Moist Dressing daily until next Wound Healing Center / Other MD appointment. Notify Wound Healing Center of regression in wound condition at [phone]. o Please direct any NON-WOUND related issues/requests for orders to patient's Primary Care Physician Wound #4 Right,Anterior Knee o Continue Home Health Visits - [facility] o Home  Health Nurse may visit PRN to address patientos wound care needs. o FACE TO FACE ENCOUNTER: MEDICARE and MEDICAID PATIENTS: I certify that this patient is under my care and that I had a face-to-face encounter that meets the physician face-to-face encounter requirements with this patient on this date. The encounter with the patient was in whole or in part for the following MEDICAL CONDITION: (primary reason  for Home Healthcare) MEDICAL NECESSITY: I certify, that based on my findings, NURSING services are a medically necessary home health service. HOME BOUND STATUS: I certify that my clinical findings support that this patient is homebound (i.e., Due to illness or injury, pt requires aid of supportive devices such as crutches, cane, wheelchairs, walkers, the use of special transportation or the assistance of another person to leave their place of residence. There is a normal inability to leave the home and doing so requires considerable and taxing effort. Other absences are for medical reasons / religious services and are infrequent or of short duration when for other reasons). o If current dressing causes regression in wound condition, may D/C ordered dressing product/s and apply Normal Saline Moist Dressing daily until next Wound Healing Center / Other MD appointment. Notify Wound Healing Center of regression in wound condition at [phone]. o Please direct any NON-WOUND related issues/requests for orders to patient's Primary Care Physician Wound #3 Right,Distal Lower Leg o Continue Home Health Visits - [facility] o Home Health Nurse may visit PRN to address patientos wound care needs. o FACE TO FACE ENCOUNTER: MEDICARE and MEDICAID PATIENTS: I certify that this patient is under my care and that I had a face-to-face encounter that meets the physician face-to-face encounter requirements with this patient on this date. The encounter with the patient was in whole or in part for the  following MEDICAL CONDITION: (primary reason for Home Healthcare) DAMANI, KELEMEN ([id number]) MEDICAL NECESSITY: I certify, that based on my findings, NURSING services are a medically necessary home health service. HOME BOUND STATUS: I certify that my clinical findings support that this patient is homebound (i.e., Due to illness or injury, pt requires aid of supportive devices such as crutches, cane, wheelchairs, walkers, the use of special transportation or the assistance of another person to leave their place of residence. There is a normal inability to leave the home and doing so requires considerable and taxing effort. Other absences are for medical reasons / religious services and are infrequent or of short duration when for other reasons). o If current dressing causes regression in wound condition, may D/C ordered dressing product/s and apply Normal Saline Moist Dressing daily until next Wound Healing Center / Other MD appointment. Notify Wound Healing Center of regression in wound condition at [phone]. o Please direct any NON-WOUND related issues/requests for orders to patient's Primary Care Physician Medications-please add to medication list. Wound #2 Right,Proximal,Anterior Lower Leg o Other: - Vitamin C, Zinc, Multivitamin Wound #4 Right,Anterior Knee o Other: - Vitamin C, Zinc, Multivitamin Wound #3 Right,Distal Lower Leg o Other: - Vitamin C, Zinc, Multivitamin Electronic Signature(s) Signed: 11/29/2016 3:55:33 PM By: Evlyn Kanner MD, FACS Signed: 11/29/2016 4:14:56 PM By: Alejandro Mulling Entered By: Alejandro Mulling on 11/29/2016 09:59:12 [name], [name] ([id number]) -------------------------------------------------------------------------------- Problem List Details Patient Name: [name] Date of Service: 11/29/2016 9:45 AM Medical Record Number: [id number] Patient Account Number: [id number] Date of Birth/Sex: [birth year] (76 y.o.  Male) Treating RN: Phillis Haggis Primary Care Provider: Karlene Einstein Other Clinician: Referring Provider: Daryel November Treating Provider/Extender: Rudene Re in Treatment: 1 Active Problems ICD-10 Encounter Code Description Active Date Diagnosis I89.0 Lymphedema, not elsewhere classified 11/22/2016 Yes I50.9 Heart failure, unspecified 11/22/2016 Yes S81.811A Laceration without foreign body, right lower leg, initial 11/22/2016 Yes encounter L97.212 Non-pressure chronic ulcer of right calf with fat layer 11/22/2016 Yes exposed Inactive Problems Resolved Problems Electronic Signature(s) Signed: 11/29/2016 9:58:34 AM By: Evlyn Kanner MD, FACS Entered By: Meyer Russel  Ananda Sitzer on 11/29/2016 09:58:33 Whiteaker, [name] ([id number]) -------------------------------------------------------------------------------- Progress Note Details Patient Name: [name], [name] Date of Service: 11/29/2016 9:45 AM Medical Record Number: [id number] Patient Account Number: [id number] Date of Birth/Sex: 09-04-1940 (76 y.o. Male) Treating RN: Phillis Haggis Primary Care Provider: Karlene Einstein Other Clinician: Referring Provider: Daryel November Treating Provider/Extender: Rudene Re in Treatment: 1 Subjective Chief Complaint Information obtained from Patient Patient presents to the wound care center for a consult due non healing wound. The non-healing wound of the right lower extremity which has been present for 4 days. History of Present Illness (HPI) The following HPI elements were documented for the patient's wound: Location: right lower extremity wound Quality: Patient reports experiencing a dull pain to affected area(s). Severity: Patient states wound are getting worse. Duration: Patient has had the wound for 4 days prior to presenting for treatment Timing: Pain in wound is Intermittent (comes and goes Context: The wound occurred when the patient had a fall and  cut his leg which required several sutures and staples. Modifying Factors: Consults to this date include:he has had bilateral lower limb lymphedema and has been using lymphedema pumps. Associated Signs and Symptoms: Patient reports having difficulty standing for long periods. 75 year old male was recently seen in the ER on 11/18/16, due to a fall and had a large right lower leg laceration. past medical history is significant for bipolar disorder, CHF, COPD, DVT, schizophrenia and seizure disorder. In the past he has also been treated by the vascular surgeons for lymphedema and was recommended compression stockings and lymphedema pump. In the ER a right leg x-ray showed soft tissue swelling without any acute bony abnormality and there was demineralization with degenerative changes of the right knee. he had a repair of his wound and his anticoagulation was held for 24 hours. He was asked to follow-up with the wound care department. ER notes the length of the wound was 30 cm and the depth was 10 mm. 3-0 nylon horizontal mattress sutures and multiple staples were used to close the wound. he was prescribed Keflex by the ER. Of note the patient does not have a follow-up appointment at the ER and his care has been transferred over to our wound center ======== Old notes: 76 year old gentleman who has a history of seizures and schizophrenia with bipolar disorder enhances in a assisted living facility. He also has CHF, COPD, GERD and remote history of DVT. He has been treated by the vascular group at [facility] for bilateral lymphedema and has been using lymphedema pumps. This problem started on September 30 when he had a lacerated wound after a fall. The staples were left in for about 10 days and when the staples were removed the wound opened out. Since then he's had several issues with wound infection and the most recent culture showed a heavy growth of Pseudomonas Gatling, Mart L.  ([id number]) aeruginosa and Staphylococcus aureus and he has received clindamycin and the most recent past and from today he is on levofloxacin. Addendum:Notes were reviewed from a previous vascular consult at [facility] by Dr. Gilda Crease who saw him on 05/27/2014. He had recommended graduated compression stockings class I and also added lymph pumps to improve the control of the patient's lymphedema. 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 Diffuse soft-tissue swelling correlate with bone scan recommended. 07/15/2015 -- he is still awaiting insurance clearance for his MRI and other than that he  has no other changes. 07/21/2015 -- he now has the insurance clearance and his MRI has been scheduled for December 28th. 08/11/2014 -- the MRI of his left lower extremity showed there was no bone destruction or periosteal reaction and there was a soft tissue wound along the lateral aspect of the distal left lower leg with skin thickening but there was no drainable fluid collection to suggest an abscess. ======= Objective Constitutional Pulse regular. Respirations normal and unlabored. Afebrile. Vitals Time Taken: 9:35 AM, Height: 70 in, Weight: 253 lbs, BMI: 36.3, Temperature: 97.9 F, Pulse: 72 bpm, Respiratory Rate: 16 breaths/min, Blood Pressure: 96/77 mmHg. Eyes Nonicteric. Reactive to light. Ears, Nose, Mouth, and Throat Lips, teeth, and gums WNL.Marland Kitchen Moist mucosa without lesions. Neck supple and nontender. No palpable supraclavicular or cervical adenopathy. Normal sized without goiter. Respiratory WNL. No retractions.. Breath sounds WNL, No rubs, rales, rhonchi, or wheeze.. Cardiovascular Heart rhythm and rate regular, no murmur or gallop.. Pedal Pulses WNL. No clubbing, cyanosis or edema. Chest Breasts symmetical and no nipple discharge.. Breast tissue WNL, no masses, lumps, or  tenderness.. Lymphatic Berber, Kery L. ([id number]) No adneopathy. No adenopathy. No adenopathy. Musculoskeletal Adexa without tenderness or enlargement.. Digits and nails w/o clubbing, cyanosis, infection, petechiae, ischemia, or inflammatory conditions.Marland Kitchen Psychiatric Judgement and insight Intact.. No evidence of depression, anxiety, or agitation.. General Notes: the lymphedema is better controlled and there is no evidence of gaping of the wound in the areas where sutures and staples have been placed. There is minimal oozing from the wound edges and overall the skin is viable. Integumentary (Hair, Skin) No suspicious lesions. No crepitus or fluctuance. No peri-wound warmth or erythema. No masses.. Wound #2 status is Open. Original cause of wound was Trauma. The wound is located on the Right,Proximal,Anterior Lower Leg. The wound measures 15cm length x 0.5cm width x 0.1cm depth; 5.89cm^2 area and 0.589cm^3 volume. The wound is limited to skin breakdown. There is no tunneling or undermining noted. There is a large amount of serosanguineous drainage noted. The wound margin is distinct with the outline attached to the wound base. There is large (67-100%) red granulation within the wound bed. There is no necrotic tissue within the wound bed. Periwound temperature was noted as No Abnormality. The periwound has tenderness on palpation. General Notes: staples and sutures in place Wound #3 status is Open. Original cause of wound was Trauma. The wound is located on the Right,Distal Lower Leg. The wound measures 7cm length x 0.5cm width x 0.1cm depth; 2.749cm^2 area and 0.275cm^3 volume. There is no tunneling or undermining noted. There is a large amount of serosanguineous drainage noted. The wound margin is distinct with the outline attached to the wound base. There is large (67-100%) red granulation within the wound bed. There is no necrotic tissue within the wound bed. Periwound temperature was  noted as No Abnormality. The periwound has tenderness on palpation. General Notes: staples and sutures in place Wound #4 status is Open. Original cause of wound was Blister. The wound is located on the Right,Anterior Knee. The wound measures 0.5cm length x 5.5cm width x 0.1cm depth; 2.16cm^2 area and 0.216cm^3 volume. There is no tunneling or undermining noted. There is a medium amount of serous drainage noted. The wound margin is distinct with the outline attached to the wound base. There is large (67-100%) red granulation within the wound bed. There is a small (1-33%) amount of necrotic tissue within the wound bed including Eschar. Periwound temperature was noted as No Abnormality. The periwound  has tenderness on palpation. Assessment Active Problems ICD-10 [name], [name]. ([id number]) I89.0 - Lymphedema, not elsewhere classified I50.9 - Heart failure, unspecified S81.811A - Laceration without foreign body, right lower leg, initial encounter L97.212 - Non-pressure chronic ulcer of right calf with fat layer exposed Plan Wound Cleansing: Wound #2 Right,Proximal,Anterior Lower Leg: Clean wound with Normal Saline. Wound #4 Right,Anterior Knee: Clean wound with Normal Saline. Wound #3 Right,Distal Lower Leg: Clean wound with Normal Saline. Anesthetic: Wound #2 Right,Proximal,Anterior Lower Leg: Topical Lidocaine 4% cream applied to wound bed prior to debridement - for clinic use Wound #4 Right,Anterior Knee: Topical Lidocaine 4% cream applied to wound bed prior to debridement - for clinic use Wound #3 Right,Distal Lower Leg: Topical Lidocaine 4% cream applied to wound bed prior to debridement - for clinic use Skin Barriers/Peri-Wound Care: Wound #4 Right,Anterior Knee: Skin Prep Primary Wound Dressing: Wound #4 Right,Anterior Knee: Aquacel Ag - or equivalent to Secondary Dressing: Wound #2 Right,Proximal,Anterior Lower Leg: ABD pad Dry Gauze Non-adherent pad - Telfa Wound #3  Right,Distal Lower Leg: ABD pad Dry Gauze Non-adherent pad - Telfa Wound #4 Right,Anterior Knee: Boardered Foam Dressing Dressing Change Frequency: Wound #2 Right,Proximal,Anterior Lower Leg: Three times weekly - HHRN to change on Wednesdays and Fridays and pt to come have it changed at wound care center on Mondays. Wound #4 Right,Anterior Knee: Three times weekly - HHRN to change on Wednesdays and Fridays and pt to come have it changed at wound care center on Mondays. TAURUS, ALAMO ([id number]) Wound #3 Right,Distal Lower Leg: Three times weekly - HHRN to change on Wednesdays and Fridays and pt to come have it changed at wound care center on Mondays. Follow-up Appointments: Wound #2 Right,Proximal,Anterior Lower Leg: Return Appointment in 1 week. Wound #4 Right,Anterior Knee: Return Appointment in 1 week. Wound #3 Right,Distal Lower Leg: Return Appointment in 1 week. Edema Control: Wound #2 Right,Proximal,Anterior Lower Leg: Kerlix and Coban - Right Lower Extremity Elevate legs to the level of the heart and pump ankles as often as possible Wound #3 Right,Distal Lower Leg: Kerlix and Coban - Right Lower Extremity Elevate legs to the level of the heart and pump ankles as often as possible Additional Orders / Instructions: Wound #2 Right,Proximal,Anterior Lower Leg: Increase protein intake. Wound #4 Right,Anterior Knee: Increase protein intake. Wound #3 Right,Distal Lower Leg: Increase protein intake. Home Health: Wound #2 Right,Proximal,Anterior Lower Leg: Continue Home Health Visits - [facility] Home Health Nurse may visit PRN to address patient s wound care needs. FACE TO FACE ENCOUNTER: MEDICARE and MEDICAID PATIENTS: I certify that this patient is under my care and that I had a face-to-face encounter that meets the physician face-to-face encounter requirements with this patient on this date. The encounter with the patient was in whole or in part for the following MEDICAL  CONDITION: (primary reason for Home Healthcare) MEDICAL NECESSITY: I certify, that based on my findings, NURSING services are a medically necessary home health service. HOME BOUND STATUS: I certify that my clinical findings support that this patient is homebound (i.e., Due to illness or injury, pt requires aid of supportive devices such as crutches, cane, wheelchairs, walkers, the use of special transportation or the assistance of another person to leave their place of residence. There is a normal inability to leave the home and doing so requires considerable and taxing effort. Other absences are for medical reasons / religious services and are infrequent or of short duration when for other reasons). If current dressing causes regression in wound  condition, may D/C ordered dressing product/s and apply Normal Saline Moist Dressing daily until next Wound Healing Center / Other MD appointment. Notify Wound Healing Center of regression in wound condition at [phone]. Please direct any NON-WOUND related issues/requests for orders to patient's Primary Care Physician Wound #4 Right,Anterior Knee: Continue Home Health Visits - [facility] Home Health Nurse may visit PRN to address patient s wound care needs. FACE TO FACE ENCOUNTER: MEDICARE and MEDICAID PATIENTS: I certify that this patient is under my care and that I had a face-to-face encounter that meets the physician face-to-face encounter requirements with this patient on this date. The encounter with the patient was in whole or in part for the following MEDICAL CONDITION: (primary reason for Home Healthcare) MEDICAL NECESSITY: I certify, that based on my findings, NURSING services are a medically necessary home health service. HOME BOUND STATUS: I certify that my clinical findings support that this patient is homebound (i.e., Due to [name], CARINO. ([id number]) illness or injury, pt requires aid of supportive devices such as crutches, cane,  wheelchairs, walkers, the use of special transportation or the assistance of another person to leave their place of residence. There is a normal inability to leave the home and doing so requires considerable and taxing effort. Other absences are for medical reasons / religious services and are infrequent or of short duration when for other reasons). If current dressing causes regression in wound condition, may D/C ordered dressing product/s and apply Normal Saline Moist Dressing daily until next Wound Healing Center / Other MD appointment. Notify Wound Healing Center of regression in wound condition at [phone]. Please direct any NON-WOUND related issues/requests for orders to patient's Primary Care Physician Wound #3 Right,Distal Lower Leg: Continue Home Health Visits - [facility] Home Health Nurse may visit PRN to address patient s wound care needs. FACE TO FACE ENCOUNTER: MEDICARE and MEDICAID PATIENTS: I certify that this patient is under my care and that I had a face-to-face encounter that meets the physician face-to-face encounter requirements with this patient on this date. The encounter with the patient was in whole or in part for the following MEDICAL CONDITION: (primary reason for Home Healthcare) MEDICAL NECESSITY: I certify, that based on my findings, NURSING services are a medically necessary home health service. HOME BOUND STATUS: I certify that my clinical findings support that this patient is homebound (i.e., Due to illness or injury, pt requires aid of supportive devices such as crutches, cane, wheelchairs, walkers, the use of special transportation or the assistance of another person to leave their place of residence. There is a normal inability to leave the home and doing so requires considerable and taxing effort. Other absences are for medical reasons / religious services and are infrequent or of short duration when for other reasons). If current dressing causes regression in  wound condition, may D/C ordered dressing product/s and apply Normal Saline Moist Dressing daily until next Wound Healing Center / Other MD appointment. Notify Wound Healing Center of regression in wound condition at [phone]. Please direct any NON-WOUND related issues/requests for orders to patient's Primary Care Physician Medications-please add to medication list.: Wound #2 Right,Proximal,Anterior Lower Leg: Other: - Vitamin C, Zinc, Multivitamin Wound #4 Right,Anterior Knee: Other: - Vitamin C, Zinc, Multivitamin Wound #3 Right,Distal Lower Leg: Other: - Vitamin C, Zinc, Multivitamin After a careful review I have recommended: 1. cover the wound with Telfa or a nonadherent dressing and gauze and then apply a light Kerlix and Coban and wrap from his toes  to below the knee. Should be changed 3 times a week 2. Dinner with oral antibiotics as per his PCP 3. Elevate his limbs as much as possible and has discussed this in great detail with the caregivers 4. Adequate protein, vitamin A, vitamin C and zinc 5. Review of the wound center every week Electronic Signature(s) Signed: 11/29/2016 10:02:39 AM By: Evlyn Kanner MD, FACS Duskin, Chord Elbert Ewings ([id number]) Entered By: Evlyn Kanner on 11/29/2016 10:02:39 [name], [name] ([id number]) -------------------------------------------------------------------------------- SuperBill Details Patient Name: [name] Date of Service: 11/29/2016 Medical Record Number: [id number] Patient Account Number: [id number] Date of Birth/Sex: 08/09/1941 (76 y.o. Male) Treating RN: Phillis Haggis Primary Care Provider: Karlene Einstein Other Clinician: Referring Provider: Daryel November Treating Provider/Extender: Rudene Re in Treatment: 1 Diagnosis Coding ICD-10 Codes Code Description I89.0 Lymphedema, not elsewhere classified I50.9 Heart failure, unspecified S81.811A Laceration without foreign body, right lower leg, initial  encounter L97.212 Non-pressure chronic ulcer of right calf with fat layer exposed Facility Procedures CPT4 Code: [id number] Description: 96295 - WOUND CARE VISIT-LEV 5 EST PT Modifier: Quantity: 1 Physician Procedures CPT4 Code Description: [id number] 40102 - WC PHYS LEVEL 3 - EST PT ICD-10 Description Diagnosis I89.0 Lymphedema, not elsewhere classified S81.811A Laceration without foreign body, right lower leg, init L97.212 Non-pressure chronic ulcer of right calf with  fat laye Modifier: ial encounte r exposed Quantity: 1 r Electronic Signature(s) Signed: 11/29/2016 3:55:33 PM By: Evlyn Kanner MD, FACS Signed: 11/29/2016 4:14:56 PM By: Alejandro Mulling Previous Signature: 11/29/2016 10:02:54 AM Version By: Evlyn Kanner MD, FACS Entered By: Alejandro Mulling on 11/29/2016 10:24:12

## 2016-11-29 NOTE — Progress Notes (Signed)
[name] ([id number]) Visit Report for 11/29/2016 Arrival Information Details Patient Name: [name] Date of Service: 11/29/2016 9:45 AM Medical Record Number: [id number] Patient Account Number: [id number] Date of Birth/Sex: October 05, 1940 (76 y.o. Male) Treating RN: Phillis Haggis Primary Care Turrell Severt: Karlene Einstein Other Clinician: Referring Habiba Treloar: Daryel November Treating Aelyn Stanaland/Extender: Rudene Re in Treatment: 1 Visit Information History Since Last Visit All ordered tests and consults were completed: No Patient Arrived: Wheel Chair Added or deleted any medications: No Arrival Time: 09:31 Any new allergies or adverse reactions: No Accompanied By: caregiver Had a fall or experienced change in No activities of daily living that may affect Transfer Assistance: Michiel Sites Lift risk of falls: Patient Identification Verified: Yes Signs or symptoms of abuse/neglect since last No Secondary Verification Process Yes visito Completed: Hospitalized since last visit: No Patient Requires Transmission-Based No Has Dressing in Place as Prescribed: Yes Precautions: Pain Present Now: Yes Patient Has Alerts: No Electronic Signature(s) Signed: 11/29/2016 4:14:56 PM By: Alejandro Mulling Entered By: Alejandro Mulling on 11/29/2016 09:35:09 [name], [name] Elbert Ewings ([id number]) -------------------------------------------------------------------------------- Clinic Level of Care Assessment Details Patient Name: [name] Date of Service: 11/29/2016 9:45 AM Medical Record Number: [id number] Patient Account Number: [id number] Date of Birth/Sex: 1941/04/11 (76 y.o. Male) Treating RN: Phillis Haggis Primary Care Dariah Mcsorley: Karlene Einstein Other Clinician: Referring Yamir Carignan: Daryel November Treating Lashawn Bromwell/Extender: Rudene Re in Treatment: 1 Clinic Level of Care Assessment Items TOOL 4 Quantity Score X - Use when only an EandM is performed on  FOLLOW-UP visit 1 0 ASSESSMENTS - Nursing Assessment / Reassessment X - Reassessment of Co-morbidities (includes updates in patient status) 1 10 X - Reassessment of Adherence to Treatment Plan 1 5 ASSESSMENTS - Wound and Skin Assessment / Reassessment  - Simple Wound Assessment / Reassessment - one wound 0 X - Complex Wound Assessment / Reassessment - multiple wounds 3 5  - Dermatologic / Skin Assessment (not related to wound area) 0 ASSESSMENTS - Focused Assessment  - Circumferential Edema Measurements - multi extremities 0  - Nutritional Assessment / Counseling / Intervention 0  - Lower Extremity Assessment (monofilament, tuning fork, pulses) 0  - Peripheral Arterial Disease Assessment (using hand held doppler) 0 ASSESSMENTS - Ostomy and/or Continence Assessment and Care  - Incontinence Assessment and Management 0  - Ostomy Care Assessment and Management (repouching, etc.) 0 PROCESS - Coordination of Care  - Simple Patient / Family Education for ongoing care 0 X - Complex (extensive) Patient / Family Education for ongoing care 1 20 X - Staff obtains Chiropractor, Records, Test Results / Process Orders 1 10 X - Staff telephones HHA, Nursing Homes / Clarify orders / etc 1 10  - Routine Transfer to another Facility (non-emergent condition) 0 [name], [name] ([id number])  - Routine Hospital Admission (non-emergent condition) 0  - New Admissions / Manufacturing engineer / Ordering NPWT, Apligraf, etc. 0  - Emergency Hospital Admission (emergent condition) 0 X - Simple Discharge Coordination 1 10  - Complex (extensive) Discharge Coordination 0 PROCESS - Special Needs  - Pediatric / Minor Patient Management 0  - Isolation Patient Management 0  - Hearing / Language / Visual special needs 0  - Assessment of Community assistance (transportation, [name]/C planning, etc.) 0  - Additional assistance / Altered mentation 0  - Support Surface(s) Assessment (bed,  cushion, seat, etc.) 0 INTERVENTIONS - Wound Cleansing / Measurement  - Simple Wound Cleansing - one wound 0 X - Complex Wound Cleansing - multiple wounds 3 5 X -  Wound Imaging (photographs - any number of wounds) 1 5  - Wound Tracing (instead of photographs) 0  - Simple Wound Measurement - one wound 0 X - Complex Wound Measurement - multiple wounds 3 5 INTERVENTIONS - Wound Dressings X - Small Wound Dressing one or multiple wounds 1 10  - Medium Wound Dressing one or multiple wounds 0 X - Large Wound Dressing one or multiple wounds 1 20  - Application of Medications - topical 0  - Application of Medications - injection 0 INTERVENTIONS - Miscellaneous  - External ear exam 0 [name], [name] ([id number])  - Specimen Collection (cultures, biopsies, blood, body fluids, etc.) 0  - Specimen(s) / Culture(s) sent or taken to Lab for analysis 0 X - Patient Transfer (multiple staff / Michiel Sites Lift / Similar devices) 1 10  - Simple Staple / Suture removal (25 or less) 0  - Complex Staple / Suture removal (26 or more) 0  - Hypo / Hyperglycemic Management (close monitor of Blood Glucose) 0  - Ankle / Brachial Index (ABI) - do not check if billed separately 0 X - Vital Signs 1 5 Has the patient been seen at the hospital within the last three years: Yes Total Score: 160 Level Of Care: New/Established - Level 5 Electronic Signature(s) Signed: 11/29/2016 4:14:56 PM By: Alejandro Mulling Entered By: Alejandro Mulling on 11/29/2016 10:24:04 [name], [name] ([id number]) -------------------------------------------------------------------------------- Encounter Discharge Information Details Patient Name: [name] Date of Service: 11/29/2016 9:45 AM Medical Record Number: [id number] Patient Account Number: [id number] Date of Birth/Sex: 1941/01/25 (76 y.o. Male) Treating RN: Phillis Haggis Primary Care Vallorie Niccoli: Karlene Einstein Other Clinician: Referring Vinnie Gombert:  Daryel November Treating Bali Lyn/Extender: Rudene Re in Treatment: 1 Encounter Discharge Information Items Discharge Pain Level: 0 Discharge Condition: Stable Ambulatory Status: Wheelchair Discharge Destination: Nursing Home Transportation: Private Auto Accompanied By: caregiver Schedule Follow-up Appointment: Yes Medication Reconciliation completed and provided to Patient/Care No Lovina Zuver: Provided on Clinical Summary of Care: 11/29/2016 Form Type Recipient Paper Patient JC Electronic Signature(s) Signed: 11/29/2016 4:14:56 PM By: Alejandro Mulling Previous Signature: 11/29/2016 10:15:33 AM Version By: Francie Massing Entered By: Alejandro Mulling on 11/29/2016 10:20:07 [name], [name] ([id number]) -------------------------------------------------------------------------------- Lower Extremity Assessment Details Patient Name: [name] Date of Service: 11/29/2016 9:45 AM Medical Record Number: [id number] Patient Account Number: [id number] Date of Birth/Sex: 1941/03/03 (76 y.o. Male) Treating RN: Phillis Haggis Primary Care Mazell Aylesworth: Karlene Einstein Other Clinician: Referring Jacinto Keil: Daryel November Treating Terran Klinke/Extender: Rudene Re in Treatment: 1 Vascular Assessment Pulses: Dorsalis Pedis Palpable: [Right:No] Doppler Audible: [Right:Yes] Posterior Tibial Extremity colors, hair growth, and conditions: Extremity Color: [Right:Hyperpigmented] Temperature of Extremity: [Right:Cool] Capillary Refill: [Right:> 3 seconds] Toe Nail Assessment Left: Right: Thick: Yes Discolored: Yes Deformed: Yes Improper Length and Hygiene: No Electronic Signature(s) Signed: 11/29/2016 4:14:56 PM By: Alejandro Mulling Entered By: Alejandro Mulling on 11/29/2016 09:52:36 [name], [name] ([id number]) -------------------------------------------------------------------------------- Multi Wound Chart Details Patient Name: [name] Date of  Service: 11/29/2016 9:45 AM Medical Record Number: [id number] Patient Account Number: [id number] Date of Birth/Sex: 07-26-1941 (76 y.o. Male) Treating RN: Phillis Haggis Primary Care Celester Lech: Karlene Einstein Other Clinician: Referring Linkon Siverson: Daryel November Treating Khadeejah Castner/Extender: Rudene Re in Treatment: 1 Vital Signs Height(in): 70 Pulse(bpm): 72 Weight(lbs): 253 Blood Pressure 96/77 (mmHg): Body Mass Index(BMI): 36 Temperature(F): 97.9 Respiratory Rate 16 (breaths/min): Photos: [2:No Photos] [3:No Photos] [4:No Photos] Wound Location: [2:Right Lower Leg - Anterior, Proximal] [3:Right Lower Leg - Distal] [4:Right Knee - Anterior] Wounding Event: [2:Trauma] [3:Trauma] [4:Blister] Primary  Etiology: [2:Trauma, Other] [3:Trauma, Other] [4:Lymphedema] Comorbid History: [2:Lymphedema, Chronic Obstructive Pulmonary Disease (COPD), Sleep Apnea, Congestive Heart Failure, Deep Vein Thrombosis, Hypertension, Seizure Disorder] [3:Lymphedema, Chronic Obstructive Pulmonary Disease (COPD), Sleep Apnea,  Congestive Heart Failure, Deep Vein Thrombosis, Hypertension, Seizure Disorder] [4:Lymphedema, Chronic Obstructive Pulmonary Disease (COPD), Sleep Apnea, Congestive Heart Failure, Deep Vein Thrombosis, Hypertension, Seizure Disorder] Date Acquired: [2:11/18/2016] [3:11/18/2016] [4:11/29/2016] Weeks of Treatment: [2:1] [3:1] [4:0] Wound Status: [2:Open] [3:Open] [4:Open] Measurements L x W x [name] 15x0.5x0.1 [3:7x0.5x0.1] [4:0.5x5.5x0.1] (cm) Area (cm) : [2:5.89] [3:2.749] [4:2.16] Volume (cm) : [2:0.589] [3:0.275] [4:0.216] % Reduction in Area: [2:0.00%] [3:0.00%] [4:N/A] % Reduction in Volume: 0.00% [3:0.00%] [4:N/A] Classification: [2:Partial Thickness] [3:Partial Thickness] [4:Partial Thickness] Exudate Amount: [2:Large] [3:Large] [4:Medium] Exudate Type: [2:Serosanguineous] [3:Serosanguineous] [4:Serous] Exudate Color: [2:red, brown] [3:red, brown] [4:amber] Wound  Margin: [2:Distinct, outline attached] [3:Distinct, outline attached] [4:Distinct, outline attached] Granulation Amount: [2:Large (67-100%)] [3:Large (67-100%)] [4:Large (67-100%)] Granulation Quality: [2:Red] [3:Red] [4:Red] Necrotic Amount: [2:None Present (0%)] [3:None Present (0%)] [4:Small (1-33%)] Necrotic Tissue: N/A N/A Eschar Exposed Structures: Fascia: No N/A N/A Fat Layer (Subcutaneous Tissue) Exposed: No Tendon: No Muscle: No Joint: No Bone: No Limited to Skin Breakdown Epithelialization: None None None Periwound Skin Texture: No Abnormalities Noted No Abnormalities Noted No Abnormalities Noted Periwound Skin No Abnormalities Noted No Abnormalities Noted No Abnormalities Noted Moisture: Periwound Skin Color: No Abnormalities Noted No Abnormalities Noted No Abnormalities Noted Temperature: No Abnormality No Abnormality No Abnormality Tenderness on Yes Yes Yes Palpation: Wound Preparation: Ulcer Cleansing: Ulcer Cleansing: Ulcer Cleansing: Rinsed/Irrigated with Rinsed/Irrigated with Rinsed/Irrigated with Saline Saline Saline, Other: soap and water Topical Anesthetic Topical Anesthetic Applied: None Applied: None Topical Anesthetic Applied: None Assessment Notes: staples and sutures in staples and sutures in N/A place place Treatment Notes Electronic Signature(s) Signed: 11/29/2016 9:58:39 AM By: Evlyn Kanner MD, FACS Entered By: Evlyn Kanner on 11/29/2016 09:58:38 [name], [name] ([id number]) -------------------------------------------------------------------------------- Multi-Disciplinary Care Plan Details Patient Name: [name] Date of Service: 11/29/2016 9:45 AM Medical Record Number: [id number] Patient Account Number: [id number] Date of Birth/Sex: 08-27-1940 (76 y.o. Male) Treating RN: Phillis Haggis Primary Care Mustafa Potts: Karlene Einstein Other Clinician: Referring Bastion Bolger: Daryel November Treating Tyse Auriemma/Extender: Rudene Re in Treatment: 1 Active Inactive ` Abuse / Safety / Falls / Self Care Management Nursing Diagnoses: Potential for falls Goals: Patient will remain injury free Date Initiated: 11/22/2016 Target Resolution Date: 02/05/2017 Goal Status: Active Interventions: Assess fall risk on admission and as needed Assess impairment of mobility on admission and as needed per policy Notes: ` Nutrition Nursing Diagnoses: Imbalanced nutrition Potential for alteratiion in Nutrition/Potential for imbalanced nutrition Goals: Patient/caregiver agrees to and verbalizes understanding of need to use nutritional supplements and/or vitamins as prescribed Date Initiated: 11/22/2016 Target Resolution Date: 02/12/2017 Goal Status: Active Interventions: Assess patient nutrition upon admission and as needed per policy Provide education on nutrition Treatment Activities: Education provided on Nutrition : 11/22/2016 [name], [name] ([id number]) Notes: ` Orientation to the Wound Care Program Nursing Diagnoses: Knowledge deficit related to the wound healing center program Goals: Patient/caregiver will verbalize understanding of the Wound Healing Center Program Date Initiated: 11/22/2016 Target Resolution Date: 12/11/2016 Goal Status: Active Interventions: Provide education on orientation to the wound center Notes: ` Pain, Acute or Chronic Nursing Diagnoses: Pain, acute or chronic: actual or potential Potential alteration in comfort, pain Goals: Patient/caregiver will verbalize adequate pain control between visits Date Initiated: 11/22/2016 Target Resolution Date: 03/12/2017 Goal Status: Active Interventions: Assess comfort goal upon admission Complete pain assessment as per visit  requirements Notes: ` Wound/Skin Impairment Nursing Diagnoses: Impaired tissue integrity Knowledge deficit related to ulceration/compromised skin integrity Goals: Ulcer/skin breakdown will have a volume  reduction of 80% by week 12 Date Initiated: 11/22/2016 Target Resolution Date: 03/05/2017 Goal Status: Active [name], [name] ([id number]) Interventions: Assess patient/caregiver ability to perform ulcer/skin care regimen upon admission and as needed Assess ulceration(s) every visit Notes: Electronic Signature(s) Signed: 11/29/2016 4:14:56 PM By: Alejandro Mulling Entered By: Alejandro Mulling on 11/29/2016 09:52:40 [name], [name] ([id number]) -------------------------------------------------------------------------------- Pain Assessment Details Patient Name: [name] Date of Service: 11/29/2016 9:45 AM Medical Record Number: [id number] Patient Account Number: [id number] Date of Birth/Sex: [birth year] (76 y.o. Male) Treating RN: Phillis Haggis Primary Care Margi Edmundson: Karlene Einstein Other Clinician: Referring Sima Lindenberger: Daryel November Treating Detrell Umscheid/Extender: Rudene Re in Treatment: 1 Active Problems Location of Pain Severity and Description of Pain Patient Has Paino Yes Site Locations Pain Location: Pain in Ulcers With Dressing Change: Yes Rate the pain. Current Pain Level: 7 Character of Pain Describe the Pain: Aching Pain Management and Medication Current Pain Management: Electronic Signature(s) Signed: 11/29/2016 4:14:56 PM By: Alejandro Mulling Entered By: Alejandro Mulling on 11/29/2016 09:35:13 [name], [name] ([id number]) -------------------------------------------------------------------------------- Patient/Caregiver Education Details Patient Name: [name] Date of Service: 11/29/2016 9:45 AM Medical Record Number: [id number] Patient Account Number: [id number] Date of Birth/Gender: Sep 08, 1940 (77 y.o. Male) Treating RN: Phillis Haggis Primary Care Physician: Karlene Einstein Other Clinician: Referring Physician: Daryel November Treating Physician/Extender: Rudene Re in Treatment: 1 Education  Assessment Education Provided To: Patient Education Topics Provided Wound/Skin Impairment: Handouts: Other: change dressing as ordered Methods: Demonstration, Explain/Verbal Responses: State content correctly Electronic Signature(s) Signed: 11/29/2016 4:14:56 PM By: Alejandro Mulling Entered By: Alejandro Mulling on 11/29/2016 10:20:18 [name], [name] ([id number]) -------------------------------------------------------------------------------- Wound Assessment Details Patient Name: [name] Date of Service: 11/29/2016 9:45 AM Medical Record Number: [id number] Patient Account Number: [id number] Date of Birth/Sex: 1940-09-10 (76 y.o. Male) Treating RN: Ashok Cordia, Debi Primary Care Ewel Lona: Karlene Einstein Other Clinician: Referring Keeon Zurn: Daryel November Treating Biran Mayberry/Extender: Rudene Re in Treatment: 1 Wound Status Wound Number: 2 Primary Trauma, Other Etiology: Wound Location: Right Lower Leg - Anterior, Proximal Wound Open Status: Wounding Event: Trauma Comorbid Lymphedema, Chronic Obstructive Date Acquired: 11/18/2016 History: Pulmonary Disease (COPD), Sleep Weeks Of Treatment: 1 Apnea, Congestive Heart Failure, Deep Clustered Wound: No Vein Thrombosis, Hypertension, Seizure Disorder Photos Photo Uploaded By: Alejandro Mulling on 11/29/2016 11:54:28 Wound Measurements Length: (cm) 15 Width: (cm) 0.5 Depth: (cm) 0.1 Area: (cm) 5.89 Volume: (cm) 0.589 % Reduction in Area: 0% % Reduction in Volume: 0% Epithelialization: None Tunneling: No Undermining: No Wound Description Classification: Partial Thickness Foul Odor Aft Wound Margin: Distinct, outline attached Slough/Fibrin Exudate Amount: Large Exudate Type: Serosanguineous Exudate Color: red, brown er Cleansing: No o No Wound Bed Granulation Amount: Large (67-100%) Exposed Structure Granulation Quality: Red Fascia Exposed: No Michetti, Caroline L. ([id number]) Necrotic  Amount: None Present (0%) Fat Layer (Subcutaneous Tissue) Exposed: No Tendon Exposed: No Muscle Exposed: No Joint Exposed: No Bone Exposed: No Limited to Skin Breakdown Periwound Skin Texture Texture Color No Abnormalities Noted: No No Abnormalities Noted: No Moisture Temperature / Pain No Abnormalities Noted: No Temperature: No Abnormality Tenderness on Palpation: Yes Wound Preparation Ulcer Cleansing: Rinsed/Irrigated with Saline Topical Anesthetic Applied: None Assessment Notes staples and sutures in place Treatment Notes Wound #2 (Right, Proximal, Anterior Lower Leg) 1. Cleansed with: Clean wound with Normal Saline 5. Secondary Dressing Applied ABD Pad Dry Gauze Non-Adherent pad 7. Secured with Tape  Notes [email or web address] Electronic Signature(s) Signed: 11/29/2016 4:14:56 PM By: Alejandro Mulling Entered By: Alejandro Mulling on 11/29/2016 09:50:59 [name], [name] ([id number]) -------------------------------------------------------------------------------- Wound Assessment Details Patient Name: [name] Date of Service: 11/29/2016 9:45 AM Medical Record Number: [id number] Patient Account Number: [id number] Date of Birth/Sex: 04/02/1941 (76 y.o. Male) Treating RN: Phillis Haggis Primary Care Tommey Barret: Karlene Einstein Other Clinician: Referring Marvion Bastidas: Daryel November Treating Larie Mathes/Extender: Rudene Re in Treatment: 1 Wound Status Wound Number: 3 Primary Trauma, Other Etiology: Wound Location: Right Lower Leg - Distal Wound Open Wounding Event: Trauma Status: Date Acquired: 11/18/2016 Comorbid Lymphedema, Chronic Obstructive Weeks Of Treatment: 1 History: Pulmonary Disease (COPD), Sleep Clustered Wound: No Apnea, Congestive Heart Failure, Deep Vein Thrombosis, Hypertension, Seizure Disorder Photos Photo Uploaded By: Alejandro Mulling on 11/29/2016 11:54:28 Wound Measurements Length: (cm) 7 Width: (cm) 0.5 Depth: (cm)  0.1 Area: (cm) 2.749 Volume: (cm) 0.275 % Reduction in Area: 0% % Reduction in Volume: 0% Epithelialization: None Tunneling: No Undermining: No Wound Description Classification: Partial Thickness Foul Odor Aft Wound Margin: Distinct, outline attached Slough/Fibrin Exudate Amount: Large Exudate Type: Serosanguineous Exudate Color: red, brown er Cleansing: No o No Wound Bed Granulation Amount: Large (67-100%) Granulation Quality: Red Michalsky, Tremel L. ([id number]) Necrotic Amount: None Present (0%) Periwound Skin Texture Texture Color No Abnormalities Noted: No No Abnormalities Noted: No Moisture Temperature / Pain No Abnormalities Noted: No Temperature: No Abnormality Tenderness on Palpation: Yes Wound Preparation Ulcer Cleansing: Rinsed/Irrigated with Saline Topical Anesthetic Applied: None Assessment Notes staples and sutures in place Treatment Notes Wound #3 (Right, Distal Lower Leg) 1. Cleansed with: Clean wound with Normal Saline 5. Secondary Dressing Applied ABD Pad Dry Gauze Non-Adherent pad 7. Secured with Tape Notes [email or web address] coban Electronic Signature(s) Signed: 11/29/2016 4:14:56 PM By: Alejandro Mulling Entered By: Alejandro Mulling on 11/29/2016 09:51:36 Kohen, [name] ([id number]) -------------------------------------------------------------------------------- Wound Assessment Details Patient Name: [name] Date of Service: 11/29/2016 9:45 AM Medical Record Number: [id number] Patient Account Number: [id number] Date of Birth/Sex: March 05, 1941 (76 y.o. Male) Treating RN: Phillis Haggis Primary Care Caleigh Rabelo: Karlene Einstein Other Clinician: Referring Kayn Haymore: Daryel November Treating Johnnell Liou/Extender: Rudene Re in Treatment: 1 Wound Status Wound Number: 4 Primary Lymphedema Etiology: Wound Location: Right Knee - Anterior Wound Open Wounding Event: Blister Status: Date Acquired: 11/29/2016 Comorbid Lymphedema,  Chronic Obstructive Weeks Of Treatment: 0 History: Pulmonary Disease (COPD), Sleep Clustered Wound: No Apnea, Congestive Heart Failure, Deep Vein Thrombosis, Hypertension, Seizure Disorder Photos Photo Uploaded By: Alejandro Mulling on 11/29/2016 11:54:44 Wound Measurements Length: (cm) 0.5 Width: (cm) 5.5 Depth: (cm) 0.1 Area: (cm) 2.16 Volume: (cm) 0.216 % Reduction in Area: % Reduction in Volume: Epithelialization: None Tunneling: No Undermining: No Wound Description Classification: Partial Thickness Foul Odor Aft Wound Margin: Distinct, outline attached Slough/Fibrin Exudate Amount: Medium Exudate Type: Serous Exudate Color: amber er Cleansing: No o No Wound Bed Granulation Amount: Large (67-100%) Granulation Quality: Red Gavia, Dimas L. ([id number]) Necrotic Amount: Small (1-33%) Necrotic Quality: Eschar Periwound Skin Texture Texture Color No Abnormalities Noted: No No Abnormalities Noted: No Moisture Temperature / Pain No Abnormalities Noted: No Temperature: No Abnormality Tenderness on Palpation: Yes Wound Preparation Ulcer Cleansing: Rinsed/Irrigated with Saline, Other: soap and water, Topical Anesthetic Applied: None Treatment Notes Wound #4 (Right, Anterior Knee) 1. Cleansed with: Clean wound with Normal Saline 3. Peri-wound Care: Skin Prep 4. Dressing Applied: Aquacel Ag 5. Secondary Dressing Applied Bordered Foam Dressing Electronic Signature(s) Signed: 11/29/2016 4:14:56 PM By: Alejandro Mulling Entered By: Alejandro Mulling on 11/29/2016 09:57:26 Klima, Hensley  LMarland Kitchen ([id number]) -------------------------------------------------------------------------------- Vitals Details Patient Name: ZAMARI, VEA Date of Service: 11/29/2016 9:45 AM Medical Record Number: [id number] Patient Account Number: [id number] Date of Birth/Sex: May 09, 1941 (76 y.o. Male) Treating RN: Phillis Haggis Primary Care Xzayvion Vaeth: Karlene Einstein Other  Clinician: Referring Kavari Parrillo: Daryel November Treating Junie Avilla/Extender: Rudene Re in Treatment: 1 Vital Signs Time Taken: 09:35 Temperature (F): 97.9 Height (in): 70 Pulse (bpm): 72 Weight (lbs): 253 Respiratory Rate (breaths/min): 16 Body Mass Index (BMI): 36.3 Blood Pressure (mmHg): 96/77 Reference Range: 80 - 120 mg / dl Electronic Signature(s) Signed: 11/29/2016 4:14:56 PM By: Alejandro Mulling Entered By: Alejandro Mulling on 11/29/2016 09:36:36

## 2016-12-06 ENCOUNTER — Encounter: Payer: Medicare Other | Admitting: Surgery

## 2016-12-06 DIAGNOSIS — L97212 Non-pressure chronic ulcer of right calf with fat layer exposed: Secondary | ICD-10-CM | POA: Diagnosis not present

## 2016-12-07 NOTE — Progress Notes (Signed)
ABED, SCHAR ([id number]) Visit Report for 12/06/2016 Chief Complaint Document Details Patient Name: [name], [name] Date of Service: 12/06/2016 9:45 AM Medical Record Number: [id number] Patient Account Number: [id number] Date of Birth/Sex: Jan 23, 1941 (76 y.o. Male) Treating RN: Phillis Haggis Primary Care Provider: Karlene Einstein Other Clinician: Referring Provider: Daryel November Treating Provider/Extender: Rudene Re in Treatment: 2 Information Obtained from: Patient Chief Complaint Patient presents to the wound care center for a consult due non healing wound. The non-healing wound of the right lower extremity which has been present for 4 days. Electronic Signature(s) Signed: 12/06/2016 9:49:44 AM By: Evlyn Kanner MD, FACS Entered By: Evlyn Kanner on 12/06/2016 09:49:44 Horney, [name] ([id number]) -------------------------------------------------------------------------------- Debridement Details Patient Name: [name] Date of Service: 12/06/2016 9:45 AM Medical Record Number: [id number] Patient Account Number: [id number] Date of Birth/Sex: [birth year] (76 y.o. Male) Treating RN: Phillis Haggis Primary Care Provider: Karlene Einstein Other Clinician: Referring Provider: Daryel November Treating Provider/Extender: Rudene Re in Treatment: 2 Debridement Performed for Wound #2 Right,Proximal,Anterior Lower Leg Assessment: Performed By: Physician Evlyn Kanner, MD Debridement: Debridement Pre-procedure Yes - 09:38 Verification/Time Out Taken: Start Time: 09:39 Pain Control: Lidocaine 4% Topical Solution Level: Skin/Subcutaneous Tissue Total Area Debrided (L x 5 (cm) x 5 (cm) = 25 (cm) W): Tissue and other Viable, Non-Viable, Exudate, Fibrin/Slough, Subcutaneous material debrided: Instrument: Curette Bleeding: Minimum Hemostasis Achieved: Pressure End Time: 09:45 Procedural Pain: 0 Post Procedural Pain: 0 Response to  Treatment: Procedure was tolerated well Post Debridement Measurements of Total Wound Length: (cm) 5 Width: (cm) 5 Depth: (cm) 0.2 Volume: (cm) 3.927 Character of Wound/Ulcer Post Requires Further Debridement Debridement: Severity of Tissue Post Debridement: Fat layer exposed Post Procedure Diagnosis Same as Pre-procedure Notes several staples which had cut through and were lying in necrotic debris was sharply dissected or removed with a staple remover. A #3 curet was also used to debride necrotic debris Electronic Signature(s) Signed: 12/06/2016 9:49:28 AM By: Evlyn Kanner MD, FACS ERIC, NEES ([id number]) Signed: 12/06/2016 4:47:36 PM By: Alejandro Mulling Entered By: Evlyn Kanner on 12/06/2016 09:49:27 Pennino, Elgin Elbert Ewings ([id number]) -------------------------------------------------------------------------------- Debridement Details Patient Name: [name] Date of Service: 12/06/2016 9:45 AM Medical Record Number: [id number] Patient Account Number: [id number] Date of Birth/Sex: 1941/06/19 (76 y.o. Male) Treating RN: Phillis Haggis Primary Care Provider: Karlene Einstein Other Clinician: Referring Provider: Daryel November Treating Provider/Extender: Rudene Re in Treatment: 2 Debridement Performed for Wound #3 Right,Distal Lower Leg Assessment: Performed By: Physician Evlyn Kanner, MD Debridement: Debridement Pre-procedure Yes - 09:38 Verification/Time Out Taken: Start Time: 09:45 Pain Control: Lidocaine 4% Topical Solution Level: Skin/Subcutaneous Tissue Total Area Debrided (L x 2 (cm) x 2 (cm) = 4 (cm) W): Tissue and other Viable, Non-Viable, Exudate, Fibrin/Slough, Subcutaneous material debrided: Instrument: Curette Bleeding: Minimum Hemostasis Achieved: Pressure End Time: 09:47 Procedural Pain: 0 Post Procedural Pain: 0 Response to Treatment: Procedure was tolerated well Post Debridement Measurements of Total Wound Length:  (cm) 2 Width: (cm) 2 Depth: (cm) 0.1 Volume: (cm) 0.314 Character of Wound/Ulcer Post Requires Further Debridement Debridement: Severity of Tissue Post Debridement: Fat layer exposed Post Procedure Diagnosis Same as Pre-procedure Electronic Signature(s) Signed: 12/06/2016 9:49:38 AM By: Evlyn Kanner MD, FACS Signed: 12/06/2016 4:47:36 PM By: Alejandro Mulling Entered By: Evlyn Kanner on 12/06/2016 09:49:38 Clingerman, [name] ([id number]) [place], [name] ([id number]) -------------------------------------------------------------------------------- HPI Details Patient Name: [name] Date of Service: 12/06/2016 9:45 AM Medical Record Number: [id number] Patient Account Number: [id number] Date of Birth/Sex: 1940/09/18 (76 y.o. Male) Treating  RN: Phillis Haggis Primary Care Provider: Karlene Einstein Other Clinician: Referring Provider: Daryel November Treating Provider/Extender: Rudene Re in Treatment: 2 History of Present Illness Location: right lower extremity wound Quality: Patient reports experiencing a dull pain to affected area(s). Severity: Patient states wound are getting worse. Duration: Patient has had the wound for 4 days prior to presenting for treatment Timing: Pain in wound is Intermittent (comes and goes Context: The wound occurred when the patient had a fall and cut his leg which required several sutures and staples. Modifying Factors: Consults to this date include:he has had bilateral lower limb lymphedema and has been using lymphedema pumps. Associated Signs and Symptoms: Patient reports having difficulty standing for long periods. HPI Description: 76 year old male was recently seen in the ER on 11/18/16, due to a fall and had a large right lower leg laceration. past medical history is significant for bipolar disorder, CHF, COPD, DVT, schizophrenia and seizure disorder. In the past he has also been treated by the vascular surgeons  for lymphedema and was recommended compression stockings and lymphedema pump. In the ER a right leg x-ray showed soft tissue swelling without any acute bony abnormality and there was demineralization with degenerative changes of the right knee. he had a repair of his wound and his anticoagulation was held for 24 hours. He was asked to follow-up with the wound care department. ER notes the length of the wound was 30 cm and the depth was 10 mm. 3-0 nylon horizontal mattress sutures and multiple staples were used to close the wound. he was prescribed Keflex by the ER. Of note the patient does not have a follow-up appointment at the ER and his care has been transferred over to our wound center 12/06/2016 -- his caregiver tells me that he was treated for her diarrhea with ciprofloxacin and probiotics and this was put on by his PCP after an appropriate stool culture was reviewed. ======== Old notes: 76 year old gentleman who has a history of seizures and schizophrenia with bipolar disorder enhances in a assisted living facility. He also has CHF, COPD, GERD and remote history of DVT. He has been treated by the vascular group at [facility] for bilateral lymphedema and has been using lymphedema pumps. This problem started on September 30 when he had a lacerated wound after a fall. The staples were left in for about 10 days and when the staples were removed the wound opened out. Since then he's had several issues with wound infection and the most recent culture showed a heavy growth of Pseudomonas aeruginosa and Staphylococcus aureus and he has received clindamycin and the most recent past and from today he is on levofloxacin. Addendum:Notes were reviewed from a previous vascular consult at [facility] by Dr. Gilda Crease who saw him on 05/27/2014. He had recommended graduated compression stockings class I and also added Vogl, Alie L. ([id number]) lymph pumps to improve the control of the  patient's lymphedema. 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 Diffuse soft-tissue swelling correlate with bone scan recommended. 07/15/2015 -- he is still awaiting insurance clearance for his MRI and other than that he has no other changes. 07/21/2015 -- he now has the insurance clearance and his MRI has been scheduled for December 28th. 08/11/2014 -- the MRI of his left lower extremity showed there was no bone destruction or periosteal reaction and there was a soft tissue wound along the lateral aspect of the  distal left lower leg with skin thickening but there was no drainable fluid collection to suggest an abscess. ======= Electronic Signature(s) Signed: 12/06/2016 9:50:19 AM By: Evlyn Kanner MD, FACS Entered By: Evlyn Kanner on 12/06/2016 09:50:19 [place], [name] ([id number]) -------------------------------------------------------------------------------- Physical Exam Details Patient Name: [name] Date of Service: 12/06/2016 9:45 AM Medical Record Number: [id number] Patient Account Number: [id number] Date of Birth/Sex: 1941/07/06 (76 y.o. Male) Treating RN: Phillis Haggis Primary Care Provider: Karlene Einstein Other Clinician: Referring Provider: Daryel November Treating Provider/Extender: Rudene Re in Treatment: 2 Constitutional . Pulse regular. Respirations normal and unlabored. Afebrile. . Eyes Nonicteric. Reactive to light. Ears, Nose, Mouth, and Throat Lips, teeth, and gums WNL.Marland Kitchen Moist mucosa without lesions. Neck supple and nontender. No palpable supraclavicular or cervical adenopathy. Normal sized without goiter. Respiratory WNL. No retractions.. Breath sounds WNL, No rubs, rales, rhonchi, or wheeze.. Cardiovascular Heart rhythm and rate regular, no murmur or gallop.. Pedal Pulses WNL. No clubbing, cyanosis or edema. Lymphatic No  adneopathy. No adenopathy. No adenopathy. Musculoskeletal Adexa without tenderness or enlargement.. Digits and nails w/o clubbing, cyanosis, infection, petechiae, ischemia, or inflammatory conditions.. Integumentary (Hair, Skin) No suspicious lesions. No crepitus or fluctuance. No peri-wound warmth or erythema. No masses.Marland Kitchen Psychiatric Judgement and insight Intact.. No evidence of depression, anxiety, or agitation.. Notes lymphedema is a bit better but a lot of the area has necrotic debris. several staples which had cut through and were lying in necrotic debris was sharply dissected or removed with a staple remover. A #3 curet was also used to debride necrotic debris Electronic Signature(s) Signed: 12/06/2016 9:50:49 AM By: Evlyn Kanner MD, FACS Entered By: Evlyn Kanner on 12/06/2016 09:50:48 Vinas, [name] ([id number]) -------------------------------------------------------------------------------- Physician Orders Details Patient Name: [name] Date of Service: 12/06/2016 9:45 AM Medical Record Number: [id number] Patient Account Number: [id number] Date of Birth/Sex: 08-Feb-1941 (76 y.o. Male) Treating RN: Phillis Haggis Primary Care Provider: Karlene Einstein Other Clinician: Referring Provider: Daryel November Treating Provider/Extender: Rudene Re in Treatment: 2 Verbal / Phone Orders: Yes ClinicianAshok Cordia, Debi Read Back and Verified: Yes Diagnosis Coding Wound Cleansing Wound #2 Right,Proximal,Anterior Lower Leg o Clean wound with Normal Saline. Wound #3 Right,Distal Lower Leg o Clean wound with Normal Saline. Anesthetic Wound #2 Right,Proximal,Anterior Lower Leg o Topical Lidocaine 4% cream applied to wound bed prior to debridement - for clinic use Wound #3 Right,Distal Lower Leg o Topical Lidocaine 4% cream applied to wound bed prior to debridement - for clinic use Primary Wound Dressing Wound #2 Right,Proximal,Anterior Lower  Leg o Aquacel Ag - or equivalent to on the open areas and telfa on the rest Wound #3 Right,Distal Lower Leg o Aquacel Ag - or equivalent to on the open areas and telfa on the rest Secondary Dressing Wound #2 Right,Proximal,Anterior Lower Leg o ABD pad o Dry Gauze o Non-adherent pad - Telfa Wound #3 Right,Distal Lower Leg o ABD pad o Dry Gauze o Non-adherent pad - Telfa Dressing Change Frequency Wound #2 Right,Proximal,Anterior Lower Leg Zehnder, Travor L. ([id number]) o Three times weekly - HHRN to change on Wednesdays and Fridays and pt to come have it changed at wound care center on Mondays. Wound #3 Right,Distal Lower Leg o Three times weekly - HHRN to change on Wednesdays and Fridays and pt to come have it changed at wound care center on Mondays. Follow-up Appointments Wound #2 Right,Proximal,Anterior Lower Leg o Return Appointment in 1 week. Wound #3 Right,Distal Lower Leg o Return Appointment in 1 week. Edema Control Wound #  2 Right,Proximal,Anterior Lower Leg o Kerlix and Coban - Right Lower Extremity o Elevate legs to the level of the heart and pump ankles as often as possible Wound #3 Right,Distal Lower Leg o Kerlix and Coban - Right Lower Extremity o Elevate legs to the level of the heart and pump ankles as often as possible Additional Orders / Instructions Wound #2 Right,Proximal,Anterior Lower Leg o Increase protein intake. Wound #3 Right,Distal Lower Leg o Increase protein intake. Home Health Wound #2 Right,Proximal,Anterior Lower Leg o Continue Home Health Visits - [facility] o Home Health Nurse may visit PRN to address patientos wound care needs. o FACE TO FACE ENCOUNTER: MEDICARE and MEDICAID PATIENTS: I certify that this patient is under my care and that I had a face-to-face encounter that meets the physician face-to-face encounter requirements with this patient on this date. The encounter with the patient was in whole  or in part for the following MEDICAL CONDITION: (primary reason for Home Healthcare) MEDICAL NECESSITY: I certify, that based on my findings, NURSING services are a medically necessary home health service. HOME BOUND STATUS: I certify that my clinical findings support that this patient is homebound (i.e., Due to illness or injury, pt requires aid of supportive devices such as crutches, cane, wheelchairs, walkers, the use of special transportation or the assistance of another person to leave their place of residence. There is a normal inability to leave the home and doing so requires considerable and taxing effort. Other absences are for medical reasons / religious services and are infrequent or of short duration when for other reasons). NHAN, QUALLEY ([id number]) o If current dressing causes regression in wound condition, may D/C ordered dressing product/s and apply Normal Saline Moist Dressing daily until next Wound Healing Center / Other MD appointment. Notify Wound Healing Center of regression in wound condition at [phone]. o Please direct any NON-WOUND related issues/requests for orders to patient's Primary Care Physician Wound #3 Right,Distal Lower Leg o Continue Home Health Visits - [facility] o Home Health Nurse may visit PRN to address patientos wound care needs. o FACE TO FACE ENCOUNTER: MEDICARE and MEDICAID PATIENTS: I certify that this patient is under my care and that I had a face-to-face encounter that meets the physician face-to-face encounter requirements with this patient on this date. The encounter with the patient was in whole or in part for the following MEDICAL CONDITION: (primary reason for Home Healthcare) MEDICAL NECESSITY: I certify, that based on my findings, NURSING services are a medically necessary home health service. HOME BOUND STATUS: I certify that my clinical findings support that this patient is homebound (i.e., Due to illness or injury, pt  requires aid of supportive devices such as crutches, cane, wheelchairs, walkers, the use of special transportation or the assistance of another person to leave their place of residence. There is a normal inability to leave the home and doing so requires considerable and taxing effort. Other absences are for medical reasons / religious services and are infrequent or of short duration when for other reasons). o If current dressing causes regression in wound condition, may D/C ordered dressing product/s and apply Normal Saline Moist Dressing daily until next Wound Healing Center / Other MD appointment. Notify Wound Healing Center of regression in wound condition at [phone]. o Please direct any NON-WOUND related issues/requests for orders to patient's Primary Care Physician Medications-please add to medication list. Wound #2 Right,Proximal,Anterior Lower Leg o Other: - Vitamin C, Zinc, Multivitamin Wound #3 Right,Distal Lower Leg o Other: -  Vitamin C, Zinc, Multivitamin Electronic Signature(s) Signed: 12/06/2016 4:45:53 PM By: Evlyn Kanner MD, FACS Signed: 12/06/2016 4:47:36 PM By: Alejandro Mulling Entered By: Alejandro Mulling on 12/06/2016 09:46:39 Yarde, [name] ([id number]) -------------------------------------------------------------------------------- Problem List Details Patient Name: [name] Date of Service: 12/06/2016 9:45 AM Medical Record Number: [id number] Patient Account Number: [id number] Date of Birth/Sex: 05-03-1941 (76 y.o. Male) Treating RN: Phillis Haggis Primary Care Provider: Karlene Einstein Other Clinician: Referring Provider: Daryel November Treating Provider/Extender: Rudene Re in Treatment: 2 Active Problems ICD-10 Encounter Code Description Active Date Diagnosis I89.0 Lymphedema, not elsewhere classified 11/22/2016 Yes I50.9 Heart failure, unspecified 11/22/2016 Yes S81.811A Laceration without foreign body, right lower  leg, initial 11/22/2016 Yes encounter L97.212 Non-pressure chronic ulcer of right calf with fat layer 11/22/2016 Yes exposed Inactive Problems Resolved Problems Electronic Signature(s) Signed: 12/06/2016 9:48:37 AM By: Evlyn Kanner MD, FACS Entered By: Evlyn Kanner on 12/06/2016 09:48:37 Tuazon, [name] ([id number]) -------------------------------------------------------------------------------- Progress Note Details Patient Name: [name] Date of Service: 12/06/2016 9:45 AM Medical Record Number: [id number] Patient Account Number: [id number] Date of Birth/Sex: 27-Feb-1941 (76 y.o. Male) Treating RN: Phillis Haggis Primary Care Provider: Karlene Einstein Other Clinician: Referring Provider: Daryel November Treating Provider/Extender: Rudene Re in Treatment: 2 Subjective Chief Complaint Information obtained from Patient Patient presents to the wound care center for a consult due non healing wound. The non-healing wound of the right lower extremity which has been present for 4 days. History of Present Illness (HPI) The following HPI elements were documented for the patient's wound: Location: right lower extremity wound Quality: Patient reports experiencing a dull pain to affected area(s). Severity: Patient states wound are getting worse. Duration: Patient has had the wound for 4 days prior to presenting for treatment Timing: Pain in wound is Intermittent (comes and goes Context: The wound occurred when the patient had a fall and cut his leg which required several sutures and staples. Modifying Factors: Consults to this date include:he has had bilateral lower limb lymphedema and has been using lymphedema pumps. Associated Signs and Symptoms: Patient reports having difficulty standing for long periods. 76 year old male was recently seen in the ER on 11/18/16, due to a fall and had a large right lower leg laceration. past medical history is significant for  bipolar disorder, CHF, COPD, DVT, schizophrenia and seizure disorder. In the past he has also been treated by the vascular surgeons for lymphedema and was recommended compression stockings and lymphedema pump. In the ER a right leg x-ray showed soft tissue swelling without any acute bony abnormality and there was demineralization with degenerative changes of the right knee. he had a repair of his wound and his anticoagulation was held for 24 hours. He was asked to follow-up with the wound care department. ER notes the length of the wound was 30 cm and the depth was 10 mm. 3-0 nylon horizontal mattress sutures and multiple staples were used to close the wound. he was prescribed Keflex by the ER. Of note the patient does not have a follow-up appointment at the ER and his care has been transferred over to our wound center 12/06/2016 -- his caregiver tells me that he was treated for her diarrhea with ciprofloxacin and probiotics and this was put on by his PCP after an appropriate stool culture was reviewed. ======== Old notes: 76 year old gentleman who has a history of seizures and schizophrenia with bipolar disorder enhances in a assisted living facility. He also has CHF, COPD, GERD and remote history of DVT. He has  been treated by Qadri, Nicklaus L. ([id number]) the vascular group at [facility] for bilateral lymphedema and has been using lymphedema pumps. This problem started on September 30 when he had a lacerated wound after a fall. The staples were left in for about 10 days and when the staples were removed the wound opened out. Since then he's had several issues with wound infection and the most recent culture showed a heavy growth of Pseudomonas aeruginosa and Staphylococcus aureus and he has received clindamycin and the most recent past and from today he is on levofloxacin. Addendum:Notes were reviewed from a previous vascular consult at [facility] by Dr. Gilda Crease who saw him  on 05/27/2014. He had recommended graduated compression stockings class I and also added lymph pumps to improve the control of the patient's lymphedema. 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 Diffuse soft-tissue swelling correlate with bone scan recommended. 07/15/2015 -- he is still awaiting insurance clearance for his MRI and other than that he has no other changes. 07/21/2015 -- he now has the insurance clearance and his MRI has been scheduled for December 28th. 08/11/2014 -- the MRI of his left lower extremity showed there was no bone destruction or periosteal reaction and there was a soft tissue wound along the lateral aspect of the distal left lower leg with skin thickening but there was no drainable fluid collection to suggest an abscess. ======= Objective Constitutional Pulse regular. Respirations normal and unlabored. Afebrile. Vitals Time Taken: 9:13 AM, Height: 70 in, Weight: 253 lbs, BMI: 36.3, Temperature: 98.3 F, Pulse: 76 bpm, Respiratory Rate: 16 breaths/min, Blood Pressure: 139/87 mmHg. Eyes Nonicteric. Reactive to light. Ears, Nose, Mouth, and Throat Lips, teeth, and gums WNL.Marland Kitchen Moist mucosa without lesions. Neck supple and nontender. No palpable supraclavicular or cervical adenopathy. Normal sized without goiter. Respiratory WNL. No retractions.. Breath sounds WNL, No rubs, rales, rhonchi, or wheeze.. Cardiovascular Heart rhythm and rate regular, no murmur or gallop.. Pedal Pulses WNL. No clubbing, cyanosis or edema. Derasmo, Dev L. ([id number]) Lymphatic No adneopathy. No adenopathy. No adenopathy. Musculoskeletal Adexa without tenderness or enlargement.. Digits and nails w/o clubbing, cyanosis, infection, petechiae, ischemia, or inflammatory conditions.Marland Kitchen Psychiatric Judgement and insight Intact.. No evidence of depression, anxiety, or  agitation.. General Notes: lymphedema is a bit better but a lot of the area has necrotic debris. several staples which had cut through and were lying in necrotic debris was sharply dissected or removed with a staple remover. A #3 curet was also used to debride necrotic debris Integumentary (Hair, Skin) No suspicious lesions. No crepitus or fluctuance. No peri-wound warmth or erythema. No masses.. Wound #2 status is Open. Original cause of wound was Trauma. The wound is located on the Right,Proximal,Anterior Lower Leg. The wound measures 15cm length x 1.5cm width x 0.1cm depth; 17.671cm^2 area and 1.767cm^3 volume. The wound is limited to skin breakdown. There is no tunneling or undermining noted. There is a large amount of serosanguineous drainage noted. The wound margin is distinct with the outline attached to the wound base. There is medium (34-66%) red granulation within the wound bed. There is a medium (34-66%) amount of necrotic tissue within the wound bed including Adherent Slough. Periwound temperature was noted as No Abnormality. The periwound has tenderness on palpation. Wound #3 status is Open. Original cause of wound was Trauma. The wound is located on the Right,Distal Lower Leg. The wound measures 7cm length x 0.5cm width  x 0.1cm depth; 2.749cm^2 area and 0.275cm^3 volume. There is no tunneling or undermining noted. There is a large amount of serosanguineous drainage noted. The wound margin is distinct with the outline attached to the wound base. There is large (67-100%) red granulation within the wound bed. There is no necrotic tissue within the wound bed. Periwound temperature was noted as No Abnormality. The periwound has tenderness on palpation. Wound #4 status is Open. Original cause of wound was Blister. The wound is located on the Right,Anterior Knee. The wound measures 0cm length x 0cm width x 0cm depth; 0cm^2 area and 0cm^3 volume. There is no tunneling or undermining  noted. There is a none present amount of drainage noted. The wound margin is distinct with the outline attached to the wound base. There is no granulation within the wound bed. There is no necrotic tissue within the wound bed. Periwound temperature was noted as No Abnormality. The periwound has tenderness on palpation. Assessment Active Problems ICD-10 I89.0 - Lymphedema, not elsewhere classified Schoch, Aladdin L. ([id number]) I50.9 - Heart failure, unspecified S81.811A - Laceration without foreign body, right lower leg, initial encounter L97.212 - Non-pressure chronic ulcer of right calf with fat layer exposed Several staples which had cut through and were lying in necrotic debris was sharply dissected or removed with a staple remover. A #3 curet was also used to debride necrotic debris. After a careful review I have recommended: 1. cover the wound with Silver alginate and then apply a light Kerlix and Coban and wrap from his toes to below the knee. Should be changed 3 times a week 2. Continue with oral antibiotics as per his PCP 3. Elevate his limbs as much as possible and has discussed this in great detail with the caregivers 4. Adequate protein, vitamin A, vitamin C and zinc 5. Review of the wound center every week Procedures Wound #2 Wound #2 is a Trauma, Other located on the Right,Proximal,Anterior Lower Leg . There was a Skin/Subcutaneous Tissue Debridement ([id number]) debridement with total area of 25 sq cm performed by Evlyn Kanner, MD. with the following instrument(s): Curette to remove Viable and Non-Viable tissue/material including Exudate, Fibrin/Slough, and Subcutaneous after achieving pain control using Lidocaine 4% Topical Solution. A time out was conducted at 09:38, prior to the start of the procedure. A Minimum amount of bleeding was controlled with Pressure. The procedure was tolerated well with a pain level of 0 throughout and a pain level of 0 following the  procedure. Post Debridement Measurements: 5cm length x 5cm width x 0.2cm depth; 3.927cm^3 volume. Character of Wound/Ulcer Post Debridement requires further debridement. Severity of Tissue Post Debridement is: Fat layer exposed. Post procedure Diagnosis Wound #2: Same as Pre-Procedure General Notes: several staples which had cut through and were lying in necrotic debris was sharply dissected or removed with a staple remover. A #3 curet was also used to debride necrotic debris. Wound #3 Wound #3 is a Trauma, Other located on the Right,Distal Lower Leg . There was a Skin/Subcutaneous Tissue Debridement ([id number]) debridement with total area of 4 sq cm performed by Evlyn Kanner, MD. with the following instrument(s): Curette to remove Viable and Non-Viable tissue/material including Exudate, Fibrin/Slough, and Subcutaneous after achieving pain control using Lidocaine 4% Topical Solution. A time out was conducted at 09:38, prior to the start of the procedure. A Minimum amount of bleeding was controlled with Pressure. The procedure was tolerated well with a pain level of 0 throughout and a pain level of 0 following the procedure.  Post Debridement Measurements: 2cm length x 2cm width x 0.1cm depth; 0.314cm^3 volume. Character of Wound/Ulcer Post Debridement requires further debridement. Severity of Tissue Post Debridement is: Fat layer exposed. Post procedure Diagnosis Wound #3: Same as Pre-Procedure Younis, Gamble L. ([id number]) Plan Wound Cleansing: Wound #2 Right,Proximal,Anterior Lower Leg: Clean wound with Normal Saline. Wound #3 Right,Distal Lower Leg: Clean wound with Normal Saline. Anesthetic: Wound #2 Right,Proximal,Anterior Lower Leg: Topical Lidocaine 4% cream applied to wound bed prior to debridement - for clinic use Wound #3 Right,Distal Lower Leg: Topical Lidocaine 4% cream applied to wound bed prior to debridement - for clinic use Primary Wound Dressing: Wound #2  Right,Proximal,Anterior Lower Leg: Aquacel Ag - or equivalent to on the open areas and telfa on the rest Wound #3 Right,Distal Lower Leg: Aquacel Ag - or equivalent to on the open areas and telfa on the rest Secondary Dressing: Wound #2 Right,Proximal,Anterior Lower Leg: ABD pad Dry Gauze Non-adherent pad - Telfa Wound #3 Right,Distal Lower Leg: ABD pad Dry Gauze Non-adherent pad - Telfa Dressing Change Frequency: Wound #2 Right,Proximal,Anterior Lower Leg: Three times weekly - HHRN to change on Wednesdays and Fridays and pt to come have it changed at wound care center on Mondays. Wound #3 Right,Distal Lower Leg: Three times weekly - HHRN to change on Wednesdays and Fridays and pt to come have it changed at wound care center on Mondays. Follow-up Appointments: Wound #2 Right,Proximal,Anterior Lower Leg: Return Appointment in 1 week. Wound #3 Right,Distal Lower Leg: Return Appointment in 1 week. Edema Control: Wound #2 Right,Proximal,Anterior Lower Leg: Kerlix and Coban - Right Lower Extremity Elevate legs to the level of the heart and pump ankles as often as possible Wound #3 Right,Distal Lower Leg: Kerlix and Coban - Right Lower Extremity Haaland, Tatsuo L. ([id number]) Elevate legs to the level of the heart and pump ankles as often as possible Additional Orders / Instructions: Wound #2 Right,Proximal,Anterior Lower Leg: Increase protein intake. Wound #3 Right,Distal Lower Leg: Increase protein intake. Home Health: Wound #2 Right,Proximal,Anterior Lower Leg: Continue Home Health Visits - [facility] Nurse may visit PRN to address patient s wound care needs. FACE TO FACE ENCOUNTER: MEDICARE and MEDICAID PATIENTS: I certify that this patient is under my care and that I had a face-to-face encounter that meets the physician face-to-face encounter requirements with this patient on this date. The encounter with the patient was in whole or in part for the following MEDICAL  CONDITION: (primary reason for Home Healthcare) MEDICAL NECESSITY: I certify, that based on my findings, NURSING services are a medically necessary home health service. HOME BOUND STATUS: I certify that my clinical findings support that this patient is homebound (i.e., Due to illness or injury, pt requires aid of supportive devices such as crutches, cane, wheelchairs, walkers, the use of special transportation or the assistance of another person to leave their place of residence. There is a normal inability to leave the home and doing so requires considerable and taxing effort. Other absences are for medical reasons / religious services and are infrequent or of short duration when for other reasons). If current dressing causes regression in wound condition, may D/C ordered dressing product/s and apply Normal Saline Moist Dressing daily until next Wound Healing Center / Other MD appointment. Notify Wound Healing Center of regression in wound condition at [phone]. Please direct any NON-WOUND related issues/requests for orders to patient's Primary Care Physician Wound #3 Right,Distal Lower Leg: Continue Home Health Visits - [facility] Nurse may visit  PRN to address patient s wound care needs. FACE TO FACE ENCOUNTER: MEDICARE and MEDICAID PATIENTS: I certify that this patient is under my care and that I had a face-to-face encounter that meets the physician face-to-face encounter requirements with this patient on this date. The encounter with the patient was in whole or in part for the following MEDICAL CONDITION: (primary reason for Home Healthcare) MEDICAL NECESSITY: I certify, that based on my findings, NURSING services are a medically necessary home health service. HOME BOUND STATUS: I certify that my clinical findings support that this patient is homebound (i.e., Due to illness or injury, pt requires aid of supportive devices such as crutches, cane, wheelchairs, walkers, the use of  special transportation or the assistance of another person to leave their place of residence. There is a normal inability to leave the home and doing so requires considerable and taxing effort. Other absences are for medical reasons / religious services and are infrequent or of short duration when for other reasons). If current dressing causes regression in wound condition, may D/C ordered dressing product/s and apply Normal Saline Moist Dressing daily until next Wound Healing Center / Other MD appointment. Notify Wound Healing Center of regression in wound condition at [phone]. Please direct any NON-WOUND related issues/requests for orders to patient's Primary Care Physician Medications-please add to medication list.: Wound #2 Right,Proximal,Anterior Lower Leg: Other: - Vitamin C, Zinc, Multivitamin Wound #3 Right,Distal Lower Leg: Other: - Vitamin C, Zinc, Multivitamin Kuhl, Rafay L. ([id number]) Several staples which had cut through and were lying in necrotic debris was sharply dissected or removed with a staple remover. A #3 curet was also used to debride necrotic debris. After a careful review I have recommended: 1. cover the wound with Silver alginate and then apply a light Kerlix and Coban and wrap from his toes to below the knee. Should be changed 3 times a week 2. Continue with oral antibiotics as per his PCP 3. Elevate his limbs as much as possible and has discussed this in great detail with the caregivers 4. Adequate protein, vitamin A, vitamin C and zinc 5. Review of the wound center every week Electronic Signature(s) Signed: 12/06/2016 9:52:35 AM By: Evlyn Kanner MD, FACS Entered By: Evlyn Kanner on 12/06/2016 09:52:34 Chanda, [name] ([id number]) -------------------------------------------------------------------------------- SuperBill Details Patient Name: [name] Date of Service: 12/06/2016 Medical Record Number: [id number] Patient Account Number:  [id number] Date of Birth/Sex: 11-16-1940 (76 y.o. Male) Treating RN: Ashok Cordia, Debi Primary Care Provider: Karlene Einstein Other Clinician: Referring Provider: Daryel November Treating Provider/Extender: Rudene Re in Treatment: 2 Diagnosis Coding ICD-10 Codes Code Description I89.0 Lymphedema, not elsewhere classified I50.9 Heart failure, unspecified S81.811A Laceration without foreign body, right lower leg, initial encounter L97.212 Non-pressure chronic ulcer of right calf with fat layer exposed Facility Procedures CPT4 Code Description: [id number] 11042 - DEB SUBQ TISSUE 20 SQ CM/< ICD-10 Description Diagnosis I89.0 Lymphedema, not elsewhere classified S81.811A Laceration without foreign body, right lower leg, init L97.212 Non-pressure chronic ulcer of right calf  with fat laye Modifier: ial encounte r exposed Quantity: 1 r CPT4 Code Description: [id number] 11045 - DEB SUBQ TISS EA ADDL 20CM ICD-10 Description Diagnosis I89.0 Lymphedema, not elsewhere classified S81.811A Laceration without foreign body, right lower leg, init L97.212 Non-pressure chronic ulcer of right calf  with fat laye Modifier: ial encounte r exposed Quantity: 1 r Physician Procedures CPT4 Code Description: [id number] 11042 - WC PHYS SUBQ TISS 20 SQ CM ICD-10 Description Diagnosis I89.0 Lymphedema, not elsewhere classified S28.315V  Laceration without foreign body, right lower leg, init L97.212 Non-pressure chronic ulcer of right calf  with fat laye Modifier: ial encounte r exposed Quantity: 1 r CPT4 Code Description: [id number] 11045 - WC PHYS SUBQ TISS EA ADDL 20 CM Description AJAMU, MAXON ([id number]) Modifier: Quantity: 1 Electronic Signature(s) Signed: 12/06/2016 9:52:56 AM By: Evlyn Kanner MD, FACS Entered By: Evlyn Kanner on 12/06/2016 09:52:55

## 2016-12-07 NOTE — Progress Notes (Signed)
AMERICUS, PERKEY ([id number]) Visit Report for 12/06/2016 Arrival Information Details Patient Name: [name] [name] [name] [name] Date of Service: 12/06/2016 9:45 AM Medical Record Number: [id number] Patient Account Number: [id number] Date of Birth/Sex: 09-18-1940 (76 y.o. Male) Treating RN: Phillis Haggis Primary Care Wileen Duncanson: Karlene Einstein Other Clinician: Referring Truda Staub: Daryel November Treating Vergia Chea/Extender: Rudene Re in Treatment: 2 Visit Information History Since Last Visit All ordered tests and consults were completed: No Patient Arrived: Wheel Chair Added or deleted any medications: No Arrival Time: 09:11 Any new allergies or adverse reactions: No Accompanied By: caregiver Had a fall or experienced change in No activities of daily living that may affect Transfer Assistance: Michiel Sites Lift risk of falls: Patient Identification Verified: Yes Signs or symptoms of abuse/neglect since last No Secondary Verification Process Yes visito Completed: Hospitalized since last visit: No Patient Requires Transmission-Based No Has Dressing in Place as Prescribed: Yes Precautions: Has Compression in Place as Prescribed: Yes Patient Has Alerts: No Pain Present Now: No Electronic Signature(s) Signed: 12/06/2016 4:47:36 PM By: Alejandro Mulling Entered By: Alejandro Mulling on 12/06/2016 09:12:19 [name] [name] [name] ([id number]) -------------------------------------------------------------------------------- Encounter Discharge Information Details Patient Name: [name] [name] Date of Service: 12/06/2016 9:45 AM Medical Record Number: [id number] Patient Account Number: [id number] Date of Birth/Sex: May 16, 1941 (76 y.o. Male) Treating RN: Phillis Haggis Primary Care Tra Wilemon: Karlene Einstein Other Clinician: Referring Aikeem Lilley: Daryel November Treating Amey Hossain/Extender: Rudene Re in Treatment: 2 Encounter Discharge Information Items Discharge Pain Level:  0 Discharge Condition: Stable Ambulatory Status: Wheelchair Discharge Destination: Nursing Home Transportation: Private Auto Accompanied By: caregiver Schedule Follow-up Appointment: Yes Medication Reconciliation completed and provided to Patient/Care No [name] [name]: Provided on Clinical Summary of Care: 12/06/2016 Form Type Recipient Paper Patient JC Electronic Signature(s) Signed: 12/06/2016 10:00:55 AM By: Gwenlyn Perking Entered By: Gwenlyn Perking on 12/06/2016 10:00:55 [name], [name] [name] ([id number]) -------------------------------------------------------------------------------- Lower Extremity Assessment Details Patient Name: [name] [name] Date of Service: 12/06/2016 9:45 AM Medical Record Number: [id number] Patient Account Number: [id number] Date of Birth/Sex: 02-08-1941 (76 y.o. Male) Treating RN: Phillis Haggis Primary Care Anvi Mangal: Karlene Einstein Other Clinician: Referring Ellerie Arenz: Daryel November Treating Shikara Mcauliffe/Extender: Rudene Re in Treatment: 2 Vascular Assessment Pulses: Dorsalis Pedis Palpable: [Right:No] Doppler Audible: [Right:Yes] Posterior Tibial Extremity colors, hair growth, and conditions: Extremity Color: [Right:Hyperpigmented] Temperature of Extremity: [Right:Cool] Capillary Refill: [Right:> 3 seconds] Toe Nail Assessment Left: Right: Thick: Yes Discolored: Yes Deformed: Yes Improper Length and Hygiene: No Electronic Signature(s) Signed: 12/06/2016 4:47:36 PM By: Alejandro Mulling Entered By: Alejandro Mulling on 12/06/2016 09:33:28 [name] [name] [name] ([id number]) -------------------------------------------------------------------------------- Multi Wound Chart Details Patient Name: [name] [name] Date of Service: 12/06/2016 9:45 AM Medical Record Number: [id number] Patient Account Number: [id number] Date of Birth/Sex: 1940/11/07 (76 y.o. Male) Treating RN: Phillis Haggis Primary Care Ferol Laiche: Karlene Einstein Other  Clinician: Referring Val Farnam: Daryel November Treating Tanya Crothers/Extender: Rudene Re in Treatment: 2 Vital Signs Height(in): 70 Pulse(bpm): 76 Weight(lbs): 253 Blood Pressure 139/87 (mmHg): Body Mass Index(BMI): 36 Temperature(F): 98.3 Respiratory Rate 16 (breaths/min): Photos: [2:No Photos] [3:No Photos] [4:No Photos] Wound Location: [2:Right Lower Leg - Anterior, Proximal] [3:Right Lower Leg - Distal] [4:Right Knee - Anterior] Wounding Event: [2:Trauma] [3:Trauma] [4:Blister] Primary Etiology: [2:Trauma, Other] [3:Trauma, Other] [4:Lymphedema] Comorbid History: [2:Lymphedema, Chronic Obstructive Pulmonary Disease (COPD), Sleep Apnea, Congestive Heart Failure, Deep Vein Thrombosis, Hypertension, Seizure Disorder] [3:Lymphedema, Chronic Obstructive Pulmonary Disease (COPD), Sleep Apnea,  Congestive Heart Failure, Deep Vein Thrombosis, Hypertension, Seizure Disorder] [4:Lymphedema, Chronic Obstructive Pulmonary Disease (COPD), Sleep Apnea, Congestive Heart Failure, Deep Vein Thrombosis, Hypertension, Seizure  Disorder] Date Acquired: [2:11/18/2016] [3:11/18/2016] [4:11/29/2016] Weeks of Treatment: [2:2] [3:2] [4:1] Wound Status: [2:Open] [3:Open] [4:Open] Measurements L x W x D 15x1.5x0.1 [3:7x0.5x0.1] [4:0x0x0] (cm) Area (cm) : [2:17.671] [3:2.749] [4:0] Volume (cm) : [2:1.767] [3:0.275] [4:0] % Reduction in Area: [2:-200.00%] [3:0.00%] [4:100.00%] % Reduction in Volume: -200.00% [3:0.00%] [4:100.00%] Classification: [2:Partial Thickness] [3:Partial Thickness] [4:Partial Thickness] Exudate Amount: [2:Large] [3:Large] [4:None Present] Exudate Type: [2:Serosanguineous] [3:Serosanguineous] [4:N/A] Exudate Color: [2:red, brown] [3:red, brown] [4:N/A] Wound Margin: [2:Distinct, outline attached] [3:Distinct, outline attached] [4:Distinct, outline attached] Granulation Amount: [2:Medium (34-66%)] [3:Large (67-100%)] [4:None Present (0%)] Granulation Quality: [2:Red]  [3:Red] [4:N/A] Necrotic Amount: [2:Medium (34-66%)] [3:None Present (0%)] [4:None Present (0%)] Exposed Structures: Fascia: No N/A N/A Fat Layer (Subcutaneous Tissue) Exposed: No Tendon: No Muscle: No Joint: No Bone: No Limited to Skin Breakdown Epithelialization: None None Large (67-100%) Debridement: Debridement (11914- Debridement (78295- N/A 11047) 11047) Pre-procedure 09:38 09:38 N/A Verification/Time Out Taken: Pain Control: Lidocaine 4% Topical Lidocaine 4% Topical N/A Solution Solution Tissue Debrided: Fibrin/Slough, Exudates, Fibrin/Slough, Exudates, N/A Subcutaneous Subcutaneous Level: Skin/Subcutaneous Skin/Subcutaneous N/A Tissue Tissue Debridement Area (sq 25 4 N/A cm): Instrument: Curette Curette N/A Bleeding: Minimum Minimum N/A Hemostasis Achieved: Pressure Pressure N/A Procedural Pain: 0 0 N/A Post Procedural Pain: 0 0 N/A Debridement Treatment Procedure was tolerated Procedure was tolerated N/A Response: well well Post Debridement 5x5x0.2 2x2x0.1 N/A Measurements L x W x D (cm) Post Debridement 3.927 0.314 N/A Volume: (cm) Periwound Skin Texture: No Abnormalities Noted No Abnormalities Noted No Abnormalities Noted Periwound Skin No Abnormalities Noted No Abnormalities Noted No Abnormalities Noted Moisture: Periwound Skin Color: No Abnormalities Noted No Abnormalities Noted No Abnormalities Noted Temperature: No Abnormality No Abnormality No Abnormality Tenderness on Yes Yes Yes Palpation: Wound Preparation: Ulcer Cleansing: Ulcer Cleansing: Ulcer Cleansing: Rinsed/Irrigated with Rinsed/Irrigated with Rinsed/Irrigated with Saline Saline Saline, Other: soap and water Topical Anesthetic Topical Anesthetic Applied: Other: lidocaine Applied: Other: lidocaine Topical Anesthetic 4% 4% Applied: None Procedures Performed: Debridement Debridement N/A Patchin, [name] L. ([id number]) Treatment Notes Wound #2 (Right, Proximal, Anterior Lower Leg) 1.  Cleansed with: Clean wound with Normal Saline Cleanse wound with antibacterial soap and water 2. Anesthetic Topical Lidocaine 4% cream to wound bed prior to debridement 4. Dressing Applied: Aquacel Ag 5. Secondary Dressing Applied ABD Pad Non-Adherent pad 7. Secured with Tape Notes kerlix, coban Wound #3 (Right, Distal Lower Leg) 1. Cleansed with: Clean wound with Normal Saline Cleanse wound with antibacterial soap and water 2. Anesthetic Topical Lidocaine 4% cream to wound bed prior to debridement 4. Dressing Applied: Aquacel Ag 5. Secondary Dressing Applied ABD Pad Non-Adherent pad 7. Secured with Tape Notes kerlix, Theatre manager) Signed: 12/06/2016 9:48:43 AM By: Evlyn Kanner MD, FACS Entered By: Evlyn Kanner on 12/06/2016 09:48:43 [name] [name] [name] ([id number]) -------------------------------------------------------------------------------- Multi-Disciplinary Care Plan Details Patient Name: [name] [name] Date of Service: 12/06/2016 9:45 AM Medical Record Number: [id number] Patient Account Number: [id number] Date of Birth/Sex: 05-16-1941 (76 y.o. Male) Treating RN: Phillis Haggis Primary Care Winnifred Dufford: Karlene Einstein Other Clinician: Referring Mailey Landstrom: Daryel November Treating Vonnie Spagnolo/Extender: Rudene Re in Treatment: 2 Active Inactive ` Abuse / Safety / Falls / Self Care Management Nursing Diagnoses: Potential for falls Goals: Patient will remain injury free Date Initiated: 11/22/2016 Target Resolution Date: 02/05/2017 Goal Status: Active Interventions: Assess fall risk on admission and as needed Assess impairment of mobility on admission and as needed per policy Notes: ` Nutrition Nursing Diagnoses: Imbalanced nutrition Potential for alteratiion in Nutrition/Potential for imbalanced nutrition Goals: Patient/caregiver agrees to and  verbalizes understanding of need to use nutritional supplements and/or vitamins  as prescribed Date Initiated: 11/22/2016 Target Resolution Date: 02/12/2017 Goal Status: Active Interventions: Assess patient nutrition upon admission and as needed per policy Provide education on nutrition Treatment Activities: Education provided on Nutrition : 11/22/2016 KHYREE, CARILLO ([id number]) Notes: ` Orientation to the Wound Care Program Nursing Diagnoses: Knowledge deficit related to the wound healing center program Goals: Patient/caregiver will verbalize understanding of the Wound Healing Center Program Date Initiated: 11/22/2016 Target Resolution Date: 12/11/2016 Goal Status: Active Interventions: Provide education on orientation to the wound center Notes: ` Pain, Acute or Chronic Nursing Diagnoses: Pain, acute or chronic: actual or potential Potential alteration in comfort, pain Goals: Patient/caregiver will verbalize adequate pain control between visits Date Initiated: 11/22/2016 Target Resolution Date: 03/12/2017 Goal Status: Active Interventions: Assess comfort goal upon admission Complete pain assessment as per visit requirements Notes: ` Wound/Skin Impairment Nursing Diagnoses: Impaired tissue integrity Knowledge deficit related to ulceration/compromised skin integrity Goals: Ulcer/skin breakdown will have a volume reduction of 80% by week 12 Date Initiated: 11/22/2016 Target Resolution Date: 03/05/2017 Goal Status: Active [name] [name] [name] [name] ([id number]) Interventions: Assess patient/caregiver ability to perform ulcer/skin care regimen upon admission and as needed Assess ulceration(s) every visit Notes: Electronic Signature(s) Signed: 12/06/2016 4:47:36 PM By: Alejandro Mulling Entered By: Alejandro Mulling on 12/06/2016 09:33:32 Treat, [name] [name] ([id number]) -------------------------------------------------------------------------------- Pain Assessment Details Patient Name: [name] [name] Date of Service: 12/06/2016 9:45 AM Medical Record Number:  [id number] Patient Account Number: [id number] Date of Birth/Sex: 1940/10/18 (76 y.o. Male) Treating RN: Phillis Haggis Primary Care Tangie Stay: Karlene Einstein Other Clinician: Referring Giannie Soliday: Daryel November Treating Mateus Rewerts/Extender: Rudene Re in Treatment: 2 Active Problems Location of Pain Severity and Description of Pain Patient Has Paino No Site Locations With Dressing Change: No Pain Management and Medication Current Pain Management: Electronic Signature(s) Signed: 12/06/2016 4:47:36 PM By: Alejandro Mulling Entered By: Alejandro Mulling on 12/06/2016 09:12:27 [name] [name] [name] ([id number]) -------------------------------------------------------------------------------- Patient/Caregiver Education Details Patient Name: [name] [name] Date of Service: 12/06/2016 9:45 AM Medical Record Number: [id number] Patient Account Number: [id number] Date of Birth/Gender: March 01, 1941 (76 y.o. Male) Treating RN: Phillis Haggis Primary Care Physician: Karlene Einstein Other Clinician: Referring Physician: Daryel November Treating Physician/Extender: Rudene Re in Treatment: 2 Education Assessment Education Provided To: Patient and Caregiver Education Topics Provided Wound/Skin Impairment: Handouts: Other: change dressing as ordered Methods: Demonstration, Explain/Verbal Responses: State content correctly Electronic Signature(s) Signed: 12/06/2016 4:47:36 PM By: Alejandro Mulling Entered By: Alejandro Mulling on 12/06/2016 09:48:03 [name] [name] [name] ([id number]) -------------------------------------------------------------------------------- Wound Assessment Details Patient Name: [name] [name] Date of Service: 12/06/2016 9:45 AM Medical Record Number: [id number] Patient Account Number: [id number] Date of Birth/Sex: 09/05/1940 (76 y.o. Male) Treating RN: Ashok Cordia, Debi Primary Care Chianne Byrns: Karlene Einstein Other Clinician: Referring  Chastin Garlitz: Daryel November Treating Dulce Martian/Extender: Rudene Re in Treatment: 2 Wound Status Wound Number: 2 Primary Trauma, Other Etiology: Wound Location: Right Lower Leg - Anterior, Proximal Wound Open Status: Wounding Event: Trauma Comorbid Lymphedema, Chronic Obstructive Date Acquired: 11/18/2016 History: Pulmonary Disease (COPD), Sleep Weeks Of Treatment: 2 Apnea, Congestive Heart Failure, Deep Clustered Wound: No Vein Thrombosis, Hypertension, Seizure Disorder Photos Photo Uploaded By: Alejandro Mulling on 12/06/2016 14:19:27 Wound Measurements Length: (cm) 15 Width: (cm) 1.5 Depth: (cm) 0.1 Area: (cm) 17.671 Volume: (cm) 1.767 % Reduction in Area: -200% % Reduction in Volume: -200% Epithelialization: None Tunneling: No Undermining: No Wound Description Classification: Partial Thickness Foul Odor Aft Wound Margin: Distinct, outline attached Slough/Fibrin Exudate Amount: Large Exudate Type:  Serosanguineous Exudate Color: red, brown er Cleansing: No o Yes Wound Bed Granulation Amount: Medium (34-66%) Exposed Structure Granulation Quality: Red Fascia Exposed: No [name] [name] L. ([id number]) Necrotic Amount: Medium (34-66%) Fat Layer (Subcutaneous Tissue) Exposed: No Necrotic Quality: Adherent Slough Tendon Exposed: No Muscle Exposed: No Joint Exposed: No Bone Exposed: No Limited to Skin Breakdown Periwound Skin Texture Texture Color No Abnormalities Noted: No No Abnormalities Noted: No Moisture Temperature / Pain No Abnormalities Noted: No Temperature: No Abnormality Tenderness on Palpation: Yes Wound Preparation Ulcer Cleansing: Rinsed/Irrigated with Saline Topical Anesthetic Applied: Other: lidocaine 4%, Treatment Notes Wound #2 (Right, Proximal, Anterior Lower Leg) 1. Cleansed with: Clean wound with Normal Saline Cleanse wound with antibacterial soap and water 2. Anesthetic Topical Lidocaine 4% cream to wound bed prior to  debridement 4. Dressing Applied: Aquacel Ag 5. Secondary Dressing Applied ABD Pad Non-Adherent pad 7. Secured with Tape Notes kerlix, coban Electronic Signature(s) Signed: 12/06/2016 4:47:36 PM By: Alejandro Mulling Entered By: Alejandro Mulling on 12/06/2016 09:30:25 Stamps, [name] [name] ([id number]) -------------------------------------------------------------------------------- Wound Assessment Details Patient Name: [name] [name] Date of Service: 12/06/2016 9:45 AM Medical Record Number: [id number] Patient Account Number: [id number] Date of Birth/Sex: January 08, 1941 (76 y.o. Male) Treating RN: Ashok Cordia, Debi Primary Care Zahari Fazzino: Karlene Einstein Other Clinician: Referring Masae Lukacs: Daryel November Treating Anali Cabanilla/Extender: Rudene Re in Treatment: 2 Wound Status Wound Number: 3 Primary Trauma, Other Etiology: Wound Location: Right Lower Leg - Distal Wound Open Wounding Event: Trauma Status: Date Acquired: 11/18/2016 Comorbid Lymphedema, Chronic Obstructive Weeks Of Treatment: 2 History: Pulmonary Disease (COPD), Sleep Clustered Wound: No Apnea, Congestive Heart Failure, Deep Vein Thrombosis, Hypertension, Seizure Disorder Photos Photo Uploaded By: Alejandro Mulling on 12/06/2016 14:19:27 Wound Measurements Length: (cm) 7 Width: (cm) 0.5 Depth: (cm) 0.1 Area: (cm) 2.749 Volume: (cm) 0.275 % Reduction in Area: 0% % Reduction in Volume: 0% Epithelialization: None Tunneling: No Undermining: No Wound Description Classification: Partial Thickness Foul Odor Aft Wound Margin: Distinct, outline attached Slough/Fibrin Exudate Amount: Large Exudate Type: Serosanguineous Exudate Color: red, brown er Cleansing: No o No Wound Bed Granulation Amount: Large (67-100%) Granulation Quality: Red [name] [name] L. ([id number]) Necrotic Amount: None Present (0%) Periwound Skin Texture Texture Color No Abnormalities Noted: No No Abnormalities Noted:  No Moisture Temperature / Pain No Abnormalities Noted: No Temperature: No Abnormality Tenderness on Palpation: Yes Wound Preparation Ulcer Cleansing: Rinsed/Irrigated with Saline Topical Anesthetic Applied: Other: lidocaine 4%, Treatment Notes Wound #3 (Right, Distal Lower Leg) 1. Cleansed with: Clean wound with Normal Saline Cleanse wound with antibacterial soap and water 2. Anesthetic Topical Lidocaine 4% cream to wound bed prior to debridement 4. Dressing Applied: Aquacel Ag 5. Secondary Dressing Applied ABD Pad Non-Adherent pad 7. Secured with Tape Notes kerlix, Theatre manager) Signed: 12/06/2016 4:47:36 PM By: Alejandro Mulling Entered By: Alejandro Mulling on 12/06/2016 09:31:01 Maglione, [name] [name] ([id number]) -------------------------------------------------------------------------------- Wound Assessment Details Patient Name: [name] [name] Date of Service: 12/06/2016 9:45 AM Medical Record Number: [id number] Patient Account Number: [id number] Date of Birth/Sex: 1941/03/10 (76 y.o. Male) Treating RN: Phillis Haggis Primary Care Angie Hogg: Karlene Einstein Other Clinician: Referring Montee [name]: Daryel November Treating Blu Mcglaun/Extender: Rudene Re in Treatment: 2 Wound Status Wound Number: 4 Primary Lymphedema Etiology: Wound Location: Right Knee - Anterior Wound Open Wounding Event: Blister Status: Date Acquired: 11/29/2016 Comorbid Lymphedema, Chronic Obstructive Weeks Of Treatment: 1 History: Pulmonary Disease (COPD), Sleep Clustered Wound: No Apnea, Congestive Heart Failure, Deep Vein Thrombosis, Hypertension, Seizure Disorder Photos Photo Uploaded By: Alejandro Mulling on 12/06/2016 14:19:58 Wound Measurements Length: (  cm) Width: (cm) Depth: (cm) Area: (cm) Volume: (cm) 0 % Reduction in Area: 100% 0 % Reduction in Volume: 100% 0 Epithelialization: Large (67-100%) 0 Tunneling: No 0 Undermining: No Wound  Description Classification: Partial Thickness Wound Margin: Distinct, outline attached Exudate Amount: None Present Foul Odor After Cleansing: No Slough/Fibrino No Wound Bed Granulation Amount: None Present (0%) Necrotic Amount: None Present (0%) Periwound Skin Texture Fujita, [name] L. ([id number]) Texture Color No Abnormalities Noted: No No Abnormalities Noted: No Moisture Temperature / Pain No Abnormalities Noted: No Temperature: No Abnormality Tenderness on Palpation: Yes Wound Preparation Ulcer Cleansing: Rinsed/Irrigated with Saline, Other: soap and water, Topical Anesthetic Applied: None Electronic Signature(s) Signed: 12/06/2016 4:47:36 PM By: Alejandro Mulling Entered By: Alejandro Mulling on 12/06/2016 09:29:27 Dudding, [name] [name] ([id number]) -------------------------------------------------------------------------------- Vitals Details Patient Name: [name] [name] Date of Service: 12/06/2016 9:45 AM Medical Record Number: [id number] Patient Account Number: [id number] Date of Birth/Sex: 09-18-1940 (76 y.o. Male) Treating RN: Phillis Haggis Primary Care Christon Parada: Karlene Einstein Other Clinician: Referring Lorrene Graef: Daryel November Treating Yordin Rhoda/Extender: Rudene Re in Treatment: 2 Vital Signs Time Taken: 09:13 Temperature (F): 98.3 Height (in): 70 Pulse (bpm): 76 Weight (lbs): 253 Respiratory Rate (breaths/min): 16 Body Mass Index (BMI): 36.3 Blood Pressure (mmHg): 139/87 Reference Range: 80 - 120 mg / dl Electronic Signature(s) Signed: 12/06/2016 4:47:36 PM By: Alejandro Mulling Entered By: Alejandro Mulling on 12/06/2016 09:14:10

## 2016-12-13 ENCOUNTER — Encounter: Payer: Medicare Other | Attending: Surgery | Admitting: Surgery

## 2016-12-13 DIAGNOSIS — I509 Heart failure, unspecified: Secondary | ICD-10-CM | POA: Diagnosis not present

## 2016-12-13 DIAGNOSIS — K219 Gastro-esophageal reflux disease without esophagitis: Secondary | ICD-10-CM | POA: Diagnosis not present

## 2016-12-13 DIAGNOSIS — J449 Chronic obstructive pulmonary disease, unspecified: Secondary | ICD-10-CM | POA: Insufficient documentation

## 2016-12-13 DIAGNOSIS — I890 Lymphedema, not elsewhere classified: Secondary | ICD-10-CM | POA: Insufficient documentation

## 2016-12-13 DIAGNOSIS — Z7982 Long term (current) use of aspirin: Secondary | ICD-10-CM | POA: Diagnosis not present

## 2016-12-13 DIAGNOSIS — Z7901 Long term (current) use of anticoagulants: Secondary | ICD-10-CM | POA: Insufficient documentation

## 2016-12-13 DIAGNOSIS — S81811A Laceration without foreign body, right lower leg, initial encounter: Secondary | ICD-10-CM | POA: Diagnosis not present

## 2016-12-13 DIAGNOSIS — G40909 Epilepsy, unspecified, not intractable, without status epilepticus: Secondary | ICD-10-CM | POA: Insufficient documentation

## 2016-12-13 DIAGNOSIS — F319 Bipolar disorder, unspecified: Secondary | ICD-10-CM | POA: Insufficient documentation

## 2016-12-13 DIAGNOSIS — W19XXXA Unspecified fall, initial encounter: Secondary | ICD-10-CM | POA: Diagnosis not present

## 2016-12-13 DIAGNOSIS — Z79899 Other long term (current) drug therapy: Secondary | ICD-10-CM | POA: Diagnosis not present

## 2016-12-13 DIAGNOSIS — L97212 Non-pressure chronic ulcer of right calf with fat layer exposed: Secondary | ICD-10-CM | POA: Insufficient documentation

## 2016-12-13 DIAGNOSIS — Z880 Allergy status to penicillin: Secondary | ICD-10-CM | POA: Diagnosis not present

## 2016-12-13 DIAGNOSIS — Z86718 Personal history of other venous thrombosis and embolism: Secondary | ICD-10-CM | POA: Insufficient documentation

## 2016-12-13 DIAGNOSIS — F209 Schizophrenia, unspecified: Secondary | ICD-10-CM | POA: Insufficient documentation

## 2016-12-14 NOTE — Progress Notes (Signed)
MAXTEN, SHULER ([id number]) Visit Report for 12/13/2016 Chief Complaint Document Details Patient Name: [name], [name] Date of Service: 12/13/2016 12:30 PM Medical Record Number: [id number] Patient Account Number: [id number] Date of Birth/Sex: 10-03-1940 (76 y.o. Male) Treating RN: Phillis Haggis Primary Care Provider: Karlene Einstein Other Clinician: Referring Provider: Daryel November Treating Provider/Extender: Rudene Re in Treatment: 3 Information Obtained from: Patient Chief Complaint Patient presents to the wound care center for a consult due non healing wound. The non-healing wound of the right lower extremity which has been present for 4 days. Electronic Signature(s) Signed: 12/13/2016 1:13:36 PM By: Evlyn Kanner MD, FACS Entered By: Evlyn Kanner on 12/13/2016 13:13:36 Skowron, [name] ([id number]) -------------------------------------------------------------------------------- Debridement Details Patient Name: [name] Date of Service: 12/13/2016 12:30 PM Medical Record Number: [id number] Patient Account Number: [id number] Date of Birth/Sex: March 27, 1941 (76 y.o. Male) Treating RN: Phillis Haggis Primary Care Provider: Karlene Einstein Other Clinician: Referring Provider: Daryel November Treating Provider/Extender: Rudene Re in Treatment: 3 Debridement Performed for Wound #2 Right,Proximal,Anterior Lower Leg Assessment: Performed By: Physician Evlyn Kanner, MD Debridement: Debridement Pre-procedure Yes - 12:59 Verification/Time Out Taken: Start Time: 13:00 Pain Control: Lidocaine 4% Topical Solution Level: Skin/Subcutaneous Tissue Total Area Debrided (L x 3 (cm) x 2 (cm) = 6 (cm) W): Tissue and other Viable, Non-Viable, Exudate, Fibrin/Slough, Subcutaneous material debrided: Instrument: Forceps, Scissors Bleeding: Minimum Hemostasis Achieved: Pressure End Time: 13:06 Procedural Pain: 0 Post Procedural Pain:  0 Response to Treatment: Procedure was tolerated well Post Debridement Measurements of Total Wound Length: (cm) 14.2 Width: (cm) 4.3 Depth: (cm) 0.1 Volume: (cm) 4.796 Character of Wound/Ulcer Post Requires Further Debridement Debridement: Severity of Tissue Post Debridement: Fat layer exposed Post Procedure Diagnosis Same as Pre-procedure Electronic Signature(s) Signed: 12/13/2016 1:13:28 PM By: Evlyn Kanner MD, FACS Signed: 12/13/2016 4:38:39 PM By: Alejandro Mulling Entered By: Evlyn Kanner on 12/13/2016 13:13:28 Mckenna, Fard Elbert Ewings ([id number]) Leavy, [name] ([id number]) -------------------------------------------------------------------------------- HPI Details Patient Name: [name] Date of Service: 12/13/2016 12:30 PM Medical Record Number: [id number] Patient Account Number: [id number] Date of Birth/Sex: Jan 27, 1941 (76 y.o. Male) Treating RN: Phillis Haggis Primary Care Provider: Karlene Einstein Other Clinician: Referring Provider: Daryel November Treating Provider/Extender: Rudene Re in Treatment: 3 History of Present Illness Location: right lower extremity wound Quality: Patient reports experiencing a dull pain to affected area(s). Severity: Patient states wound are getting worse. Duration: Patient has had the wound for 4 days prior to presenting for treatment Timing: Pain in wound is Intermittent (comes and goes Context: The wound occurred when the patient had a fall and cut his leg which required several sutures and staples. Modifying Factors: Consults to this date include:he has had bilateral lower limb lymphedema and has been using lymphedema pumps. Associated Signs and Symptoms: Patient reports having difficulty standing for long periods. HPI Description: 76 year old male was recently seen in the ER on 11/18/16, due to a fall and had a large right lower leg laceration. past medical history is significant for bipolar disorder, CHF, COPD,  DVT, schizophrenia and seizure disorder. In the past he has also been treated by the vascular surgeons for lymphedema and was recommended compression stockings and lymphedema pump. In the ER a right leg x-ray showed soft tissue swelling without any acute bony abnormality and there was demineralization with degenerative changes of the right knee. he had a repair of his wound and his anticoagulation was held for 24 hours. He was asked to follow-up with the wound care department. ER notes the length of  the wound was 30 cm and the depth was 10 mm. 3-0 nylon horizontal mattress sutures and multiple staples were used to close the wound. he was prescribed Keflex by the ER. Of note the patient does not have a follow-up appointment at the ER and his care has been transferred over to our wound center 12/06/2016 -- his caregiver tells me that he was treated for her diarrhea with ciprofloxacin and probiotics and this was put on by his PCP after an appropriate stool culture was reviewed. ======== Old notes: 76 year old gentleman who has a history of seizures and schizophrenia with bipolar disorder enhances in a assisted living facility. He also has CHF, COPD, GERD and remote history of DVT. He has been treated by the vascular group at [facility] for bilateral lymphedema and has been using lymphedema pumps. This problem started on September 30 when he had a lacerated wound after a fall. The staples were left in for about 10 days and when the staples were removed the wound opened out. Since then he's had several issues with wound infection and the most recent culture showed a heavy growth of Pseudomonas aeruginosa and Staphylococcus aureus and he has received clindamycin and the most recent past and from today he is on levofloxacin. Addendum:Notes were reviewed from a previous vascular consult at [facility] by Dr. Gilda CreaseSchnier who saw him on 05/27/2014. He had recommended graduated compression  stockings class I and also added Fusco, Rino L. ([id number]) lymph pumps to improve the control of the patient's lymphedema. 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 Diffuse soft-tissue swelling correlate with bone scan recommended. 07/15/2015 -- he is still awaiting insurance clearance for his MRI and other than that he has no other changes. 07/21/2015 -- he now has the insurance clearance and his MRI has been scheduled for December 28th. 08/11/2014 -- the MRI of his left lower extremity showed there was no bone destruction or periosteal reaction and there was a soft tissue wound along the lateral aspect of the distal left lower leg with skin thickening but there was no drainable fluid collection to suggest an abscess. ======= Electronic Signature(s) Signed: 12/13/2016 1:13:42 PM By: Evlyn KannerBritto, Tira Lafferty MD, FACS Entered By: Evlyn KannerBritto, Jayziah Bankhead on 12/13/2016 13:13:42 Lesiak, Doy HutchingJERRY L. ([id number]) -------------------------------------------------------------------------------- Physical Exam Details Patient Name: [name] DarbyRAWFORD, Khaleb L. Date of Service: 12/13/2016 12:30 PM Medical Record Number: [id number] Patient Account Number: [id number] Date of Birth/Sex: [birth year](75 y.o. Male) Treating RN: Phillis HaggisPinkerton, Debi Primary Care Provider: Karlene EinsteinASANAYAKA, GAYANI Other Clinician: Referring Provider: Daryel NovemberWILLIAMS, JONATHAN Treating Provider/Extender: Rudene ReBritto, Pebble Botkin Weeks in Treatment: 3 Constitutional . Pulse regular. Respirations normal and unlabored. Afebrile. . Eyes Nonicteric. Reactive to light. Ears, Nose, Mouth, and Throat Lips, teeth, and gums WNL.Marland Kitchen. Moist mucosa without lesions. Neck supple and nontender. No palpable supraclavicular or cervical adenopathy. Normal sized without goiter. Respiratory WNL. No retractions.. Breath sounds WNL, No rubs, rales, rhonchi, or wheeze.. Cardiovascular Heart rhythm  and rate regular, no murmur or gallop.. Pedal Pulses WNL. No clubbing, cyanosis or edema. Chest Breasts symmetical and no nipple discharge.. Breast tissue WNL, no masses, lumps, or tenderness.. Lymphatic No adneopathy. No adenopathy. No adenopathy. Musculoskeletal Adexa without tenderness or enlargement.. Digits and nails w/o clubbing, cyanosis, infection, petechiae, ischemia, or inflammatory conditions.. Integumentary (Hair, Skin) No suspicious lesions. No crepitus or fluctuance. No peri-wound warmth or erythema. No masses.Marland Kitchen. Psychiatric Judgement and insight Intact.. No evidence of depression, anxiety, or agitation.. Notes the open  area in the middle part of the wound had subcutaneous debris which I sharply removed with a forcep and scissors. The rest of the staples and sutures were removed appropriately and after preparing the skin I have applied Steri-Strips.. Electronic Signature(s) Signed: 12/13/2016 1:14:16 PM By: Evlyn Kanner MD, FACS Entered By: Evlyn Kanner on 12/13/2016 13:14:16 Lewellen, [name] ([id number]) -------------------------------------------------------------------------------- Physician Orders Details Patient Name: [name] Date of Service: 12/13/2016 12:30 PM Medical Record Number: [id number] Patient Account Number: [id number] Date of Birth/Sex: Jul 22, 1941 (76 y.o. Male) Treating RN: Phillis Haggis Primary Care Provider: Karlene Einstein Other Clinician: Referring Provider: Daryel November Treating Provider/Extender: Rudene Re in Treatment: 3 Verbal / Phone Orders: Yes Clinician: Pinkerton, Debi Read Back and Verified: Yes Diagnosis Coding Wound Cleansing Wound #2 Right,Proximal,Anterior Lower Leg o Clean wound with Normal Saline. Wound #3 Right,Distal Lower Leg o Clean wound with Normal Saline. Anesthetic Wound #2 Right,Proximal,Anterior Lower Leg o Topical Lidocaine 4% cream applied to wound bed prior to debridement - for  clinic use Wound #3 Right,Distal Lower Leg o Topical Lidocaine 4% cream applied to wound bed prior to debridement - for clinic use Skin Barriers/Peri-Wound Care o Moisturizing lotion - not on wounds Primary Wound Dressing Wound #2 Right,Proximal,Anterior Lower Leg o Aquacel Ag - or equivalent to on the open areas and telfa on the rest o Other: - steri-strips (DO NOT REMOVE) Wound #3 Right,Distal Lower Leg o Aquacel Ag - or equivalent to on the open areas and telfa on the rest o Other: - steri-strips (DO NOT REMOVE) Secondary Dressing Wound #2 Right,Proximal,Anterior Lower Leg o ABD pad o Non-adherent pad - Telfa Wound #3 Right,Distal Lower Leg o ABD pad o Non-adherent pad - Telfa Kia, Ryler L. ([id number]) Dressing Change Frequency Wound #2 Right,Proximal,Anterior Lower Leg o Three times weekly - HHRN to change on Wednesdays and Fridays and pt to come have it changed at wound care center on Mondays. Wound #3 Right,Distal Lower Leg o Three times weekly - HHRN to change on Wednesdays and Fridays and pt to come have it changed at wound care center on Mondays. Follow-up Appointments Wound #2 Right,Proximal,Anterior Lower Leg o Return Appointment in 1 week. Wound #3 Right,Distal Lower Leg o Return Appointment in 1 week. Edema Control Wound #2 Right,Proximal,Anterior Lower Leg o Kerlix and Coban - Right Lower Extremity - wrap 3cm from knee and 3cm from toes o Elevate legs to the level of the heart and pump ankles as often as possible Wound #3 Right,Distal Lower Leg o Kerlix and Coban - Right Lower Extremity - wrap 3cm from knee and 3cm from toes o Elevate legs to the level of the heart and pump ankles as often as possible Additional Orders / Instructions Wound #2 Right,Proximal,Anterior Lower Leg o Increase protein intake. Wound #3 Right,Distal Lower Leg o Increase protein intake. Home Health Wound #2 Right,Proximal,Anterior Lower  Leg o Continue Home Health Visits - [facility] o Home Health Nurse may visit PRN to address patientos wound care needs. o FACE TO FACE ENCOUNTER: MEDICARE and MEDICAID PATIENTS: I certify that this patient is under my care and that I had a face-to-face encounter that meets the physician face-to-face encounter requirements with this patient on this date. The encounter with the patient was in whole or in part for the following MEDICAL CONDITION: (primary reason for Home Healthcare) MEDICAL NECESSITY: I certify, that based on my findings, NURSING services are a medically necessary home health service. HOME BOUND STATUS: I certify that my clinical findings support  that this patient is homebound (i.e., Due to illness or injury, pt requires aid of supportive devices such as crutches, cane, wheelchairs, walkers, the use of special transportation or the assistance of another person to leave their place of residence. There is a normal inability to leave the home and doing so requires considerable and taxing effort. Other absences are for medical reasons / religious services and are infrequent or of short duration when for other reasons). ARGIE, APPLEGATE ([id number]) o If current dressing causes regression in wound condition, may D/C ordered dressing product/s and apply Normal Saline Moist Dressing daily until next Wound Healing Center / Other MD appointment. Notify Wound Healing Center of regression in wound condition at [phone]. o Please direct any NON-WOUND related issues/requests for orders to patient's Primary Care Physician Wound #3 Right,Distal Lower Leg o Continue Home Health Visits - [facility] o Home Health Nurse may visit PRN to address patientos wound care needs. o FACE TO FACE ENCOUNTER: MEDICARE and MEDICAID PATIENTS: I certify that this patient is under my care and that I had a face-to-face encounter that meets the physician face-to-face encounter requirements with this  patient on this date. The encounter with the patient was in whole or in part for the following MEDICAL CONDITION: (primary reason for Home Healthcare) MEDICAL NECESSITY: I certify, that based on my findings, NURSING services are a medically necessary home health service. HOME BOUND STATUS: I certify that my clinical findings support that this patient is homebound (i.e., Due to illness or injury, pt requires aid of supportive devices such as crutches, cane, wheelchairs, walkers, the use of special transportation or the assistance of another person to leave their place of residence. There is a normal inability to leave the home and doing so requires considerable and taxing effort. Other absences are for medical reasons / religious services and are infrequent or of short duration when for other reasons). o If current dressing causes regression in wound condition, may D/C ordered dressing product/s and apply Normal Saline Moist Dressing daily until next Wound Healing Center / Other MD appointment. Notify Wound Healing Center of regression in wound condition at [phone]. o Please direct any NON-WOUND related issues/requests for orders to patient's Primary Care Physician Medications-please add to medication list. Wound #2 Right,Proximal,Anterior Lower Leg o Other: - Vitamin C, Zinc, Multivitamin Wound #3 Right,Distal Lower Leg o Other: - Vitamin C, Zinc, Multivitamin Electronic Signature(s) Signed: 12/13/2016 4:28:20 PM By: Evlyn Kanner MD, FACS Signed: 12/13/2016 4:38:39 PM By: Alejandro Mulling Entered By: Alejandro Mulling on 12/13/2016 13:23:37 Corsetti, [name] ([id number]) -------------------------------------------------------------------------------- Problem List Details Patient Name: [name] Date of Service: 12/13/2016 12:30 PM Medical Record Number: [id number] Patient Account Number: [id number] Date of Birth/Sex: Jan 20, 1941 (76 y.o. Male) Treating RN: Phillis Haggis Primary Care Provider: Karlene Einstein Other Clinician: Referring Provider: Daryel November Treating Provider/Extender: Rudene Re in Treatment: 3 Active Problems ICD-10 Encounter Code Description Active Date Diagnosis I89.0 Lymphedema, not elsewhere classified 11/22/2016 Yes I50.9 Heart failure, unspecified 11/22/2016 Yes S81.811A Laceration without foreign body, right lower leg, initial 11/22/2016 Yes encounter L97.212 Non-pressure chronic ulcer of right calf with fat layer 11/22/2016 Yes exposed Inactive Problems Resolved Problems Electronic Signature(s) Signed: 12/13/2016 1:13:13 PM By: Evlyn Kanner MD, FACS Entered By: Evlyn Kanner on 12/13/2016 13:13:13 Keeler, [name] ([id number]) -------------------------------------------------------------------------------- Progress Note Details Patient Name: [name] Date of Service: 12/13/2016 12:30 PM Medical Record Number: [id number] Patient Account Number: [id number] Date of Birth/Sex: 09-05-1940 (76 y.o. Male) Treating RN: Ashok Cordia, Debi  Primary Care Provider: Karlene Einstein Other Clinician: Referring Provider: Daryel November Treating Provider/Extender: Rudene Re in Treatment: 3 Subjective Chief Complaint Information obtained from Patient Patient presents to the wound care center for a consult due non healing wound. The non-healing wound of the right lower extremity which has been present for 4 days. History of Present Illness (HPI) The following HPI elements were documented for the patient's wound: Location: right lower extremity wound Quality: Patient reports experiencing a dull pain to affected area(s). Severity: Patient states wound are getting worse. Duration: Patient has had the wound for 4 days prior to presenting for treatment Timing: Pain in wound is Intermittent (comes and goes Context: The wound occurred when the patient had a fall and cut his leg which required  several sutures and staples. Modifying Factors: Consults to this date include:he has had bilateral lower limb lymphedema and has been using lymphedema pumps. Associated Signs and Symptoms: Patient reports having difficulty standing for long periods. 76 year old male was recently seen in the ER on 11/18/16, due to a fall and had a large right lower leg laceration. past medical history is significant for bipolar disorder, CHF, COPD, DVT, schizophrenia and seizure disorder. In the past he has also been treated by the vascular surgeons for lymphedema and was recommended compression stockings and lymphedema pump. In the ER a right leg x-ray showed soft tissue swelling without any acute bony abnormality and there was demineralization with degenerative changes of the right knee. he had a repair of his wound and his anticoagulation was held for 24 hours. He was asked to follow-up with the wound care department. ER notes the length of the wound was 30 cm and the depth was 10 mm. 3-0 nylon horizontal mattress sutures and multiple staples were used to close the wound. he was prescribed Keflex by the ER. Of note the patient does not have a follow-up appointment at the ER and his care has been transferred over to our wound center 12/06/2016 -- his caregiver tells me that he was treated for her diarrhea with ciprofloxacin and probiotics and this was put on by his PCP after an appropriate stool culture was reviewed. ======== Old notes: 76 year old gentleman who has a history of seizures and schizophrenia with bipolar disorder enhances in a assisted living facility. He also has CHF, COPD, GERD and remote history of DVT. He has been treated by Buffa, Pepper L. ([id number]) the vascular group at [facility] for bilateral lymphedema and has been using lymphedema pumps. This problem started on September 30 when he had a lacerated wound after a fall. The staples were left in for about 10 days and when the  staples were removed the wound opened out. Since then he's had several issues with wound infection and the most recent culture showed a heavy growth of Pseudomonas aeruginosa and Staphylococcus aureus and he has received clindamycin and the most recent past and from today he is on levofloxacin. Addendum:Notes were reviewed from a previous vascular consult at [facility] by Dr. Gilda Crease who saw him on 05/27/2014. He had recommended graduated compression stockings class I and also added lymph pumps to improve the control of the patient's lymphedema. 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 Diffuse soft-tissue swelling correlate with bone scan recommended. 07/15/2015 -- he is still awaiting insurance clearance for his MRI and other than that he has no other changes. 07/21/2015 -- he now has the  insurance clearance and his MRI has been scheduled for December 28th. 08/11/2014 -- the MRI of his left lower extremity showed there was no bone destruction or periosteal reaction and there was a soft tissue wound along the lateral aspect of the distal left lower leg with skin thickening but there was no drainable fluid collection to suggest an abscess. ======= Objective Constitutional Pulse regular. Respirations normal and unlabored. Afebrile. Vitals Time Taken: 12:37 PM, Height: 70 in, Weight: 253 lbs, BMI: 36.3, Temperature: 98.2 F, Pulse: 78 bpm, Respiratory Rate: 16 breaths/min, Blood Pressure: 133/81 mmHg. Eyes Nonicteric. Reactive to light. Ears, Nose, Mouth, and Throat Lips, teeth, and gums WNL.Marland Kitchen Moist mucosa without lesions. Neck supple and nontender. No palpable supraclavicular or cervical adenopathy. Normal sized without goiter. Respiratory WNL. No retractions.. Breath sounds WNL, No rubs, rales, rhonchi, or wheeze.. Cardiovascular Heart rhythm and rate regular, no murmur or  gallop.. Pedal Pulses WNL. No clubbing, cyanosis or edema. AVYUKTH, BONTEMPO ([id number]) Chest Breasts symmetical and no nipple discharge.. Breast tissue WNL, no masses, lumps, or tenderness.. Lymphatic No adneopathy. No adenopathy. No adenopathy. Musculoskeletal Adexa without tenderness or enlargement.. Digits and nails w/o clubbing, cyanosis, infection, petechiae, ischemia, or inflammatory conditions.Marland Kitchen Psychiatric Judgement and insight Intact.. No evidence of depression, anxiety, or agitation.. General Notes: the open area in the middle part of the wound had subcutaneous debris which I sharply removed with a forcep and scissors. The rest of the staples and sutures were removed appropriately and after preparing the skin I have applied Steri-Strips.. Integumentary (Hair, Skin) No suspicious lesions. No crepitus or fluctuance. No peri-wound warmth or erythema. No masses.. Wound #2 status is Open. Original cause of wound was Trauma. The wound is located on the Right,Proximal,Anterior Lower Leg. The wound measures 14.2cm length x 4.3cm width x 0.1cm depth; 47.956cm^2 area and 4.796cm^3 volume. The wound is limited to skin breakdown. There is no tunneling or undermining noted. There is a large amount of serosanguineous drainage noted. The wound margin is distinct with the outline attached to the wound base. There is medium (34-66%) red granulation within the wound bed. There is a medium (34-66%) amount of necrotic tissue within the wound bed including Adherent Slough. Periwound temperature was noted as No Abnormality. The periwound has tenderness on palpation. Wound #3 status is Open. Original cause of wound was Trauma. The wound is located on the Right,Distal Lower Leg. The wound measures 5.5cm length x 0.4cm width x 0.1cm depth; 1.728cm^2 area and 0.173cm^3 volume. There is no tunneling or undermining noted. There is a large amount of serosanguineous drainage noted. The wound margin is  distinct with the outline attached to the wound base. There is medium (34-66%) red granulation within the wound bed. There is a medium (34-66%) amount of necrotic tissue within the wound bed. Periwound temperature was noted as No Abnormality. The periwound has tenderness on palpation. Assessment Active Problems ICD-10 I89.0 - Lymphedema, not elsewhere classified I50.9 - Heart failure, unspecified S81.811A - Laceration without foreign body, right lower leg, initial encounter L97.212 - Non-pressure chronic ulcer of right calf with fat layer exposed Pettingill, Garrin L. ([id number]) Procedures Wound #2 Wound #2 is a Trauma, Other located on the Right,Proximal,Anterior Lower Leg . There was a Skin/Subcutaneous Tissue Debridement ([id number]) debridement with total area of 6 sq cm performed by Evlyn Kanner, MD. with the following instrument(s): Forceps and Scissors to remove Viable and Non- Viable tissue/material including Exudate, Fibrin/Slough, and Subcutaneous after achieving pain control using Lidocaine 4% Topical Solution. A time out  was conducted at 12:59, prior to the start of the procedure. A Minimum amount of bleeding was controlled with Pressure. The procedure was tolerated well with a pain level of 0 throughout and a pain level of 0 following the procedure. Post Debridement Measurements: 14.2cm length x 4.3cm width x 0.1cm depth; 4.796cm^3 volume. Character of Wound/Ulcer Post Debridement requires further debridement. Severity of Tissue Post Debridement is: Fat layer exposed. Post procedure Diagnosis Wound #2: Same as Pre-Procedure Plan Wound Cleansing: Wound #2 Right,Proximal,Anterior Lower Leg: Clean wound with Normal Saline. Wound #3 Right,Distal Lower Leg: Clean wound with Normal Saline. Anesthetic: Wound #2 Right,Proximal,Anterior Lower Leg: Topical Lidocaine 4% cream applied to wound bed prior to debridement - for clinic use Wound #3 Right,Distal Lower Leg: Topical  Lidocaine 4% cream applied to wound bed prior to debridement - for clinic use Skin Barriers/Peri-Wound Care: Moisturizing lotion - not on wounds Primary Wound Dressing: Wound #2 Right,Proximal,Anterior Lower Leg: Aquacel Ag - or equivalent to on the open areas and telfa on the rest Other: - steri-strips (DO NOT REMOVE) Wound #3 Right,Distal Lower Leg: Aquacel Ag - or equivalent to on the open areas and telfa on the rest Other: - steri-strips (DO NOT REMOVE) Secondary Dressing: Wound #2 Right,Proximal,Anterior Lower Leg: ABD pad Kramlich, Kaige L. ([id number]) Non-adherent pad - Telfa Wound #3 Right,Distal Lower Leg: ABD pad Non-adherent pad - Telfa Dressing Change Frequency: Wound #2 Right,Proximal,Anterior Lower Leg: Three times weekly - HHRN to change on Wednesdays and Fridays and pt to come have it changed at wound care center on Mondays. Wound #3 Right,Distal Lower Leg: Three times weekly - HHRN to change on Wednesdays and Fridays and pt to come have it changed at wound care center on Mondays. Follow-up Appointments: Wound #2 Right,Proximal,Anterior Lower Leg: Return Appointment in 1 week. Wound #3 Right,Distal Lower Leg: Return Appointment in 1 week. Edema Control: Wound #2 Right,Proximal,Anterior Lower Leg: Kerlix and Coban - Right Lower Extremity - wrap 3cm from knee and 3cm from toes Elevate legs to the level of the heart and pump ankles as often as possible Wound #3 Right,Distal Lower Leg: Kerlix and Coban - Right Lower Extremity - wrap 3cm from knee and 3cm from toes Elevate legs to the level of the heart and pump ankles as often as possible Additional Orders / Instructions: Wound #2 Right,Proximal,Anterior Lower Leg: Increase protein intake. Wound #3 Right,Distal Lower Leg: Increase protein intake. Home Health: Wound #2 Right,Proximal,Anterior Lower Leg: Continue Home Health Visits - [facility] Home Health Nurse may visit PRN to address patient s wound care  needs. FACE TO FACE ENCOUNTER: MEDICARE and MEDICAID PATIENTS: I certify that this patient is under my care and that I had a face-to-face encounter that meets the physician face-to-face encounter requirements with this patient on this date. The encounter with the patient was in whole or in part for the following MEDICAL CONDITION: (primary reason for Home Healthcare) MEDICAL NECESSITY: I certify, that based on my findings, NURSING services are a medically necessary home health service. HOME BOUND STATUS: I certify that my clinical findings support that this patient is homebound (i.e., Due to illness or injury, pt requires aid of supportive devices such as crutches, cane, wheelchairs, walkers, the use of special transportation or the assistance of another person to leave their place of residence. There is a normal inability to leave the home and doing so requires considerable and taxing effort. Other absences are for medical reasons / religious services and are infrequent or of short duration when  for other reasons). If current dressing causes regression in wound condition, may D/C ordered dressing product/s and apply Normal Saline Moist Dressing daily until next Wound Healing Center / Other MD appointment. Notify Wound Healing Center of regression in wound condition at [phone]. Please direct any NON-WOUND related issues/requests for orders to patient's Primary Care Physician Wound #3 Right,Distal Lower Leg: Continue Home Health Visits - [facility] Home Health Nurse may visit PRN to address patient s wound care needs. FACE TO FACE ENCOUNTER: MEDICARE and MEDICAID PATIENTS: I certify that this patient is under my care and that I had a face-to-face encounter that meets the physician face-to-face encounter MARL, SEAGO. ([id number]) requirements with this patient on this date. The encounter with the patient was in whole or in part for the following MEDICAL CONDITION: (primary reason for Home  Healthcare) MEDICAL NECESSITY: I certify, that based on my findings, NURSING services are a medically necessary home health service. HOME BOUND STATUS: I certify that my clinical findings support that this patient is homebound (i.e., Due to illness or injury, pt requires aid of supportive devices such as crutches, cane, wheelchairs, walkers, the use of special transportation or the assistance of another person to leave their place of residence. There is a normal inability to leave the home and doing so requires considerable and taxing effort. Other absences are for medical reasons / religious services and are infrequent or of short duration when for other reasons). If current dressing causes regression in wound condition, may D/C ordered dressing product/s and apply Normal Saline Moist Dressing daily until next Wound Healing Center / Other MD appointment. Notify Wound Healing Center of regression in wound condition at [phone]. Please direct any NON-WOUND related issues/requests for orders to patient's Primary Care Physician Medications-please add to medication list.: Wound #2 Right,Proximal,Anterior Lower Leg: Other: - Vitamin C, Zinc, Multivitamin Wound #3 Right,Distal Lower Leg: Other: - Vitamin C, Zinc, Multivitamin At the center of the wound there was subcutaneous debris and be used forceps and scissors used to debride necrotic debris. the remaining staples and sutures were appropriately removed. After a careful review I have recommended: 1. cover the wound with Silver alginate and then apply a light Kerlix and Coban and wrap from his toes to below the knee. Should be changed 3 times a week 2. Elevate his limbs as much as possible and has discussed this in great detail with the caregivers 3. Adequate protein, vitamin A, vitamin C and zinc 4. Review of the wound center every week Electronic Signature(s) Signed: 12/13/2016 3:35:12 PM By: Evlyn Kanner MD, FACS Previous Signature:  12/13/2016 1:15:56 PM Version By: Evlyn Kanner MD, FACS Entered By: Evlyn Kanner on 12/13/2016 15:35:12 Molden, [name] ([id number]) -------------------------------------------------------------------------------- SuperBill Details Patient Name: [name] Date of Service: 12/13/2016 Medical Record Number: [id number] Patient Account Number: [id number] Date of Birth/Sex: 1940/11/11 (76 y.o. Male) Treating RN: Phillis Haggis Primary Care Provider: Karlene Einstein Other Clinician: Referring Provider: Daryel November Treating Provider/Extender: Rudene Re in Treatment: 3 Diagnosis Coding ICD-10 Codes Code Description I89.0 Lymphedema, not elsewhere classified I50.9 Heart failure, unspecified S81.811A Laceration without foreign body, right lower leg, initial encounter L97.212 Non-pressure chronic ulcer of right calf with fat layer exposed Facility Procedures CPT4 Code Description: [id number] 11042 - DEB SUBQ TISSUE 20 SQ CM/< ICD-10 Description Diagnosis I89.0 Lymphedema, not elsewhere classified I50.9 Heart failure, unspecified S81.811A Laceration without foreign body, right lower leg, init L97.212  Non-pressure chronic ulcer of right calf with fat laye Modifier: ial encounte r  exposed Quantity: 1 r Physician Procedures CPT4 Code Description: [id number] 11042 - WC PHYS SUBQ TISS 20 SQ CM ICD-10 Description Diagnosis I89.0 Lymphedema, not elsewhere classified I50.9 Heart failure, unspecified S81.811A Laceration without foreign body, right lower leg, init L97.212  Non-pressure chronic ulcer of right calf with fat laye Modifier: ial encounte r exposed Quantity: 1 r Electronic Signature(s) Signed: 12/13/2016 1:16:07 PM By: Evlyn Kanner MD, FACS Entered By: Evlyn Kanner on 12/13/2016 13:16:06

## 2016-12-14 NOTE — Progress Notes (Signed)
[name], LIST ([id number]) Visit Report for 12/13/2016 Arrival Information Details Patient Name: [name] Date of Service: 12/13/2016 12:30 PM Medical Record Number: [id number] Patient Account Number: [id number] Date of Birth/Sex: 05-18-1941 (76 y.o. Male) Treating RN: [name] Primary Care [name]: [name] Other Clinician: Referring [name]: [name] Treating Jorie Zee/Extender: [name] in Treatment: 3 Visit Information History Since Last Visit All ordered tests and consults were completed: No Patient Arrived: Wheel Chair Added or deleted any medications: No Arrival Time: 12:35 Any new allergies or adverse reactions: No Accompanied By: caregiver Had a fall or experienced change in No activities of daily living that may affect Transfer Assistance: [name] Lift risk of falls: Patient Identification Verified: Yes Signs or symptoms of abuse/neglect since last No Secondary Verification Process Yes visito Completed: Hospitalized since last visit: No Patient Requires Transmission-Based No Has Dressing in Place as Prescribed: Yes Precautions: Has Compression in Place as Prescribed: Yes Patient Has Alerts: No Pain Present Now: No Electronic Signature(s) Signed: 12/13/2016 4:38:39 PM By: [name] Entered By: [name] on 12/13/2016 12:36:24 [name], [name] ([id number]) -------------------------------------------------------------------------------- Encounter Discharge Information Details Patient Name: [name] Date of Service: 12/13/2016 12:30 PM Medical Record Number: [id number] Patient Account Number: [id number] Date of Birth/Sex: 05-Dec-1940 (76 y.o. Male) Treating RN: [name] Primary Care Cheridan Kibler: [name] Other Clinician: Referring Keylin Podolsky: [name] Treating Brice Kossman/Extender: [name] in Treatment: 3 Encounter Discharge Information Items Discharge Pain Level:  0 Discharge Condition: Stable Ambulatory Status: Wheelchair Discharge Destination: Nursing Home Transportation: Private Auto Accompanied By: caregiver Schedule Follow-up Appointment: Yes Medication Reconciliation completed No and provided to Patient/Care Denver Bentson: Provided on Clinical Summary of Care: 12/13/2016 Form Type Recipient Paper Patient JC Electronic Signature(s) Signed: 12/13/2016 1:19:56 PM By: [name] Entered By: [name] on 12/13/2016 13:19:56 [name], [name] ([id number]) -------------------------------------------------------------------------------- Lower Extremity Assessment Details Patient Name: [name] Date of Service: 12/13/2016 12:30 PM Medical Record Number: [id number] Patient Account Number: [id number] Date of Birth/Sex: Sep 13, 1940 (76 y.o. Male) Treating RN: [name] Primary Care Lia Vigilante: [name] Other Clinician: Referring Salayah [name]: [name] Treating Aeryn Medici/Extender: [name] in Treatment: 3 Edema Assessment Assessed: [Left: No] [Right: No] Edema: [Left: Ye] [Right: s] Vascular Assessment Pulses: Dorsalis Pedis Palpable: [Right:No] Doppler Audible: [Right:Yes] Posterior Tibial Extremity colors, hair growth, and conditions: Extremity Color: [Right:Hyperpigmented] Temperature of Extremity: [Right:Cool] Capillary Refill: [Right:> 3 seconds] Toe Nail Assessment Left: Right: Thick: Yes Discolored: Yes Deformed: Yes Improper Length and Hygiene: No Electronic Signature(s) Signed: 12/13/2016 4:38:39 PM By: [name] Entered By: [name] on 12/13/2016 12:54:24 [name], [name] ([id number]) -------------------------------------------------------------------------------- Multi Wound Chart Details Patient Name: [name] Date of Service: 12/13/2016 12:30 PM Medical Record Number: [id number] Patient Account Number: [id number] Date of Birth/Sex: February 12, 1941 (76 y.o.  Male) Treating RN: [name] Primary Care Audrina Marten: [name] Other Clinician: Referring Jema Deegan: [name] Treating Mittie Knittel/Extender: [name] in Treatment: 3 Vital Signs Height(in): 70 Pulse(bpm): 78 Weight(lbs): 253 Blood Pressure 133/81 (mmHg): Body Mass Index(BMI): 36 Temperature(F): 98.2 Respiratory Rate 16 (breaths/min): Photos: [2:No Photos] [3:No Photos] [N/A:N/A] Wound Location: [2:Right Lower Leg - Anterior, Proximal] [3:Right Lower Leg - Distal] [N/A:N/A] Wounding Event: [2:Trauma] [3:Trauma] [N/A:N/A] Primary Etiology: [2:Trauma, Other] [3:Trauma, Other] [N/A:N/A] Comorbid History: [2:Lymphedema, Chronic Obstructive Pulmonary Disease (COPD), Sleep Apnea, Congestive Heart Failure, Deep Vein Thrombosis, Hypertension, Seizure Disorder] [3:Lymphedema, Chronic Obstructive Pulmonary Disease (COPD), Sleep Apnea,  Congestive Heart Failure, Deep Vein Thrombosis, Hypertension, Seizure Disorder] [N/A:N/A] Date Acquired: [2:11/18/2016] [3:11/18/2016] [N/A:N/A] Weeks of  Treatment: [2:3] [3:3] [N/A:N/A] Wound Status: [2:Open] [3:Open] [N/A:N/A] Measurements L x W x D 14.2x4.3x0.1 [3:5.5x0.4x0.1] [N/A:N/A] (cm) Area (cm) : [2:47.956] [3:1.728] [N/A:N/A] Volume (cm) : [2:4.796] [3:0.173] [N/A:N/A] % Reduction in Area: [2:-714.20%] [3:37.10%] [N/A:N/A] % Reduction in Volume: -714.30% [3:37.10%] [N/A:N/A] Classification: [2:Partial Thickness] [3:Partial Thickness] [N/A:N/A] Exudate Amount: [2:Large] [3:Large] [N/A:N/A] Exudate Type: [2:Serosanguineous] [3:Serosanguineous] [N/A:N/A] Exudate Color: [2:red, brown] [3:red, brown] [N/A:N/A] Wound Margin: [2:Distinct, outline attached] [3:Distinct, outline attached] [N/A:N/A] Granulation Amount: [2:Medium (34-66%)] [3:Medium (34-66%)] [N/A:N/A] Granulation Quality: [2:Red] [3:Red] [N/A:N/A] Necrotic Amount: [2:Medium (34-66%)] [3:Medium (34-66%)] [N/A:N/A] Exposed Structures: Fascia: No N/A  N/A Fat Layer (Subcutaneous Tissue) Exposed: No Tendon: No Muscle: No Joint: No Bone: No Limited to Skin Breakdown Epithelialization: None None N/A Debridement: Debridement (16109- N/A N/A 11047) Pre-procedure 12:59 N/A N/A Verification/Time Out Taken: Pain Control: Lidocaine 4% Topical N/A N/A Solution Tissue Debrided: Fibrin/Slough, Exudates, N/A N/A Subcutaneous Level: Skin/Subcutaneous N/A N/A Tissue Debridement Area (sq 6 N/A N/A cm): Instrument: Forceps, Scissors N/A N/A Bleeding: Minimum N/A N/A Hemostasis Achieved: Pressure N/A N/A Procedural Pain: 0 N/A N/A Post Procedural Pain: 0 N/A N/A Debridement Treatment Procedure was tolerated N/A N/A Response: well Post Debridement 14.2x4.3x0.1 N/A N/A Measurements L x W x D (cm) Post Debridement 4.796 N/A N/A Volume: (cm) Periwound Skin Texture: No Abnormalities Noted No Abnormalities Noted N/A Periwound Skin No Abnormalities Noted No Abnormalities Noted N/A Moisture: Periwound Skin Color: No Abnormalities Noted No Abnormalities Noted N/A Temperature: No Abnormality No Abnormality N/A Tenderness on Yes Yes N/A Palpation: Wound Preparation: Ulcer Cleansing: Ulcer Cleansing: N/A Rinsed/Irrigated with Rinsed/Irrigated with Saline Saline Topical Anesthetic Topical Anesthetic Applied: Other: lidocaine Applied: Other: lidocaine 4% 4% Procedures Performed: Debridement N/A N/A BURNIE, THERIEN ([id number]) Treatment Notes Electronic Signature(s) Signed: 12/13/2016 1:13:18 PM By: Evlyn Kanner MD, FACS Entered By: Evlyn Kanner on 12/13/2016 13:13:18 [name], [name] ([id number]) -------------------------------------------------------------------------------- Multi-Disciplinary Care Plan Details Patient Name: [name] Date of Service: 12/13/2016 12:30 PM Medical Record Number: [id number] Patient Account Number: [id number] Date of Birth/Sex: 11/10/1940 (76 y.o. Male) Treating RN: [name] Primary  Care Bhakti Labella: [name] Other Clinician: Referring Kynsie Falkner: [name] Treating Livio Ledwith/Extender: [name] in Treatment: 3 Active Inactive ` Abuse / Safety / Falls / Self Care Management Nursing Diagnoses: Potential for falls Goals: Patient will remain injury free Date Initiated: 11/22/2016 Target Resolution Date: 02/05/2017 Goal Status: Active Interventions: Assess fall risk on admission and as needed Assess impairment of mobility on admission and as needed per policy Notes: ` Nutrition Nursing Diagnoses: Imbalanced nutrition Potential for alteratiion in Nutrition/Potential for imbalanced nutrition Goals: Patient/caregiver agrees to and verbalizes understanding of need to use nutritional supplements and/or vitamins as prescribed Date Initiated: 11/22/2016 Target Resolution Date: 02/12/2017 Goal Status: Active Interventions: Assess patient nutrition upon admission and as needed per policy Provide education on nutrition Treatment Activities: Education provided on Nutrition : 11/22/2016 KERWIN, AUGUSTUS ([id number]) Notes: ` Orientation to the Wound Care Program Nursing Diagnoses: Knowledge deficit related to the wound healing center program Goals: Patient/caregiver will verbalize understanding of the Wound Healing Center Program Date Initiated: 11/22/2016 Target Resolution Date: 12/11/2016 Goal Status: Active Interventions: Provide education on orientation to the wound center Notes: ` Pain, Acute or Chronic Nursing Diagnoses: Pain, acute or chronic: actual or potential Potential alteration in comfort, pain Goals: Patient/caregiver will verbalize adequate pain control between visits Date Initiated: 11/22/2016 Target Resolution Date: 03/12/2017 Goal Status: Active Interventions: Assess comfort goal upon admission Complete pain assessment as per visit requirements Notes: ` Wound/Skin Impairment  Nursing Diagnoses: Impaired tissue  integrity Knowledge deficit related to ulceration/compromised skin integrity Goals: Ulcer/skin breakdown will have a volume reduction of 80% by week 12 Date Initiated: 11/22/2016 Target Resolution Date: 03/05/2017 Goal Status: Active [name] DarbyCRAWFORD, Zakariye L. ([id number]) Interventions: Assess patient/caregiver ability to perform ulcer/skin care regimen upon admission and as needed Assess ulceration(s) every visit Notes: Electronic Signature(s) Signed: 12/13/2016 4:38:39 PM By: [name] MullingPinkerton, Debra Entered By: [name] MullingPinkerton, Debra on 12/13/2016 12:56:04 [name], [name] HutchingJERRY L. ([id number]) -------------------------------------------------------------------------------- Pain Assessment Details Patient Name: [name], [name] Date of Service: 12/13/2016 12:30 PM Medical Record Number: [id number] Patient Account Number: [id number] Date of Birth/Sex: 1940/08/18 60(75 y.o. Male) Treating RN: [name] HaggisPinkerton, [name] Primary Care Cesia Orf: [name] EinsteinASANAYAKA, GAYANI Other Clinician: Referring Izzie Geers: [name] NovemberWILLIAMS, JONATHAN Treating Benny Deutschman/Extender: [name] ReBritto, Errol Weeks in Treatment: 3 Active Problems Location of Pain Severity and Description of Pain Patient Has Paino No Site Locations With Dressing Change: No Pain Management and Medication Current Pain Management: Electronic Signature(s) Signed: 12/13/2016 4:38:39 PM By: [name] MullingPinkerton, Debra Entered By: [name] MullingPinkerton, Debra on 12/13/2016 12:36:33 Haymaker, [name] HutchingJERRY L. ([id number]) -------------------------------------------------------------------------------- Patient/Caregiver Education Details Patient Name: [name], [name] Date of Service: 12/13/2016 12:30 PM Medical Record Number: [id number] Patient Account Number: [id number] Date of Birth/Gender: 1940/08/18 42(75 y.o. Male) Treating RN: [name] HaggisPinkerton, [name] Primary Care Physician: [name] EinsteinASANAYAKA, GAYANI Other Clinician: Referring Physician: Daryel NovemberWILLIAMS, JONATHAN Treating Physician/Extender: [name] ReBritto, Errol Weeks in Treatment: 3 Education  Assessment Education Provided To: Patient Education Topics Provided Wound/Skin Impairment: Handouts: Other: change dressing as ordered Methods: Demonstration, Explain/Verbal Responses: State content correctly Electronic Signature(s) Signed: 12/13/2016 4:38:39 PM By: [name] MullingPinkerton, Debra Entered By: [name] MullingPinkerton, Debra on 12/13/2016 12:56:38 Barnhart, [name] HutchingJERRY L. ([id number]) -------------------------------------------------------------------------------- Wound Assessment Details Patient Name: [name], [name] Date of Service: 12/13/2016 12:30 PM Medical Record Number: [id number] Patient Account Number: [id number] Date of Birth/Sex: 1940/08/18 15(75 y.o. Male) Treating RN: [name] CordiaPinkerton, [name] Primary Care Luwanna Brossman: [name] EinsteinASANAYAKA, GAYANI Other Clinician: Referring Danniell Rotundo: [name] NovemberWILLIAMS, JONATHAN Treating Linkoln Alkire/Extender: [name] ReBritto, Errol Weeks in Treatment: 3 Wound Status Wound Number: 2 Primary Trauma, Other Etiology: Wound Location: Right Lower Leg - Anterior, Proximal Wound Open Status: Wounding Event: Trauma Comorbid Lymphedema, Chronic Obstructive Date Acquired: 11/18/2016 History: Pulmonary Disease (COPD), Sleep Weeks Of Treatment: 3 Apnea, Congestive Heart Failure, Deep Clustered Wound: No Vein Thrombosis, Hypertension, Seizure Disorder Photos Photo Uploaded By: [name] MullingPinkerton, Debra on 12/13/2016 14:28:26 Wound Measurements Length: (cm) 14.2 Width: (cm) 4.3 Depth: (cm) 0.1 Area: (cm) 47.956 Volume: (cm) 4.796 % Reduction in Area: -714.2% % Reduction in Volume: -714.3% Epithelialization: None Tunneling: No Undermining: No Wound Description Classification: Partial Thickness Foul Odor Aft Wound Margin: Distinct, outline attached Slough/Fibrin Exudate Amount: Large Exudate Type: Serosanguineous Exudate Color: red, brown er Cleansing: No o Yes Wound Bed Granulation Amount: Medium (34-66%) Exposed Structure Granulation Quality: Red Fascia Exposed: No [name], [name]  ([id number]) Necrotic Amount: Medium (34-66%) Fat Layer (Subcutaneous Tissue) Exposed: No Necrotic Quality: Adherent Slough Tendon Exposed: No Muscle Exposed: No Joint Exposed: No Bone Exposed: No Limited to Skin Breakdown Periwound Skin Texture Texture Color No Abnormalities Noted: No No Abnormalities Noted: No Moisture Temperature / Pain No Abnormalities Noted: No Temperature: No Abnormality Tenderness on Palpation: Yes Wound Preparation Ulcer Cleansing: Rinsed/Irrigated with Saline Topical Anesthetic Applied: Other: lidocaine 4%, Treatment Notes Wound #2 (Right, Proximal, Anterior Lower Leg) 1. Cleansed with: Clean wound with Normal Saline Cleanse wound with antibacterial soap and water 2. Anesthetic Topical Lidocaine 4% cream to wound bed prior to debridement 3. Peri-wound Care: Moisturizing lotion 4. Dressing Applied: Aquacel Ag Other dressing (specify in notes) 7. Secured with Tape Notes kerlix,  coban, steri-strips, telfa Electronic Signature(s) Signed: 12/13/2016 4:38:39 PM By: [name] Entered By: [name] on 12/13/2016 12:52:23 [name], [name] ([id number]) -------------------------------------------------------------------------------- Wound Assessment Details Patient Name: [name] Date of Service: 12/13/2016 12:30 PM Medical Record Number: [id number] Patient Account Number: [id number] Date of Birth/Sex: 05/22/1941 (76 y.o. Male) Treating RN: [name] Primary Care Naveen Clardy: [name] Other Clinician: Referring Raquelle Pietro: [name] Treating Trinka Keshishyan/Extender: [name] in Treatment: 3 Wound Status Wound Number: 3 Primary Trauma, Other Etiology: Wound Location: Right Lower Leg - Distal Wound Open Wounding Event: Trauma Status: Date Acquired: 11/18/2016 Comorbid Lymphedema, Chronic Obstructive Weeks Of Treatment: 3 History: Pulmonary Disease (COPD), Sleep Clustered Wound: No Apnea, Congestive  Heart Failure, Deep Vein Thrombosis, Hypertension, Seizure Disorder Photos Photo Uploaded By: [name] on 12/13/2016 14:28:26 Wound Measurements Length: (cm) 5.5 Width: (cm) 0.4 Depth: (cm) 0.1 Area: (cm) 1.728 Volume: (cm) 0.173 % Reduction in Area: 37.1% % Reduction in Volume: 37.1% Epithelialization: None Tunneling: No Undermining: No Wound Description Classification: Partial Thickness Foul Odor Aft Wound Margin: Distinct, outline attached Slough/Fibrin Exudate Amount: Large Exudate Type: Serosanguineous Exudate Color: red, brown er Cleansing: No o No Wound Bed Granulation Amount: Medium (34-66%) Granulation Quality: Red Rhew, Ranbir L. ([id number]) Necrotic Amount: Medium (34-66%) Periwound Skin Texture Texture Color No Abnormalities Noted: No No Abnormalities Noted: No Moisture Temperature / Pain No Abnormalities Noted: No Temperature: No Abnormality Tenderness on Palpation: Yes Wound Preparation Ulcer Cleansing: Rinsed/Irrigated with Saline Topical Anesthetic Applied: Other: lidocaine 4%, Treatment Notes Wound #3 (Right, Distal Lower Leg) 1. Cleansed with: Clean wound with Normal Saline Cleanse wound with antibacterial soap and water 2. Anesthetic Topical Lidocaine 4% cream to wound bed prior to debridement 3. Peri-wound Care: Moisturizing lotion 4. Dressing Applied: Aquacel Ag Other dressing (specify in notes) 7. Secured with Tape Notes kerlix, coban, steri-strips, telfa Electronic Signature(s) Signed: 12/13/2016 4:38:39 PM By: [name] Entered By: [name] on 12/13/2016 12:51:44 Hamid, [name] ([id number]) -------------------------------------------------------------------------------- Vitals Details Patient Name: [name] Date of Service: 12/13/2016 12:30 PM Medical Record Number: [id number] Patient Account Number: [id number] Date of Birth/Sex: 01/27/1941 (76 y.o. Male) Treating RN: [name] Primary Care Tymeer Vaquera: [name] Other Clinician: Referring Glenmore Karl: [name] Treating Lajuan Godbee/Extender: [name] in Treatment: 3 Vital Signs Time Taken: 12:37 Temperature (F): 98.2 Height (in): 70 Pulse (bpm): 78 Weight (lbs): 253 Respiratory Rate (breaths/min): 16 Body Mass Index (BMI): 36.3 Blood Pressure (mmHg): 133/81 Reference Range: 80 - 120 mg / dl Electronic Signature(s) Signed: 12/13/2016 4:38:39 PM By: [name] Entered By: [name] on 12/13/2016 12:38:21

## 2016-12-20 ENCOUNTER — Ambulatory Visit: Payer: Medicare Other | Admitting: Surgery

## 2016-12-23 ENCOUNTER — Encounter: Payer: Self-pay | Admitting: Emergency Medicine

## 2016-12-23 ENCOUNTER — Inpatient Hospital Stay
Admission: EM | Admit: 2016-12-23 | Discharge: 2016-12-25 | DRG: 690 | Disposition: A | Discharge status: Home / Self Care | Payer: Medicare Other | Attending: Internal Medicine | Admitting: Internal Medicine

## 2016-12-23 ENCOUNTER — Emergency Department: Payer: Medicare Other

## 2016-12-23 DIAGNOSIS — I110 Hypertensive heart disease with heart failure: Secondary | ICD-10-CM | POA: Diagnosis present

## 2016-12-23 DIAGNOSIS — E7800 Pure hypercholesterolemia, unspecified: Secondary | ICD-10-CM | POA: Diagnosis present

## 2016-12-23 DIAGNOSIS — R059 Cough, unspecified: Secondary | ICD-10-CM

## 2016-12-23 DIAGNOSIS — N401 Enlarged prostate with lower urinary tract symptoms: Secondary | ICD-10-CM | POA: Diagnosis present

## 2016-12-23 DIAGNOSIS — I509 Heart failure, unspecified: Secondary | ICD-10-CM | POA: Diagnosis present

## 2016-12-23 DIAGNOSIS — L03116 Cellulitis of left lower limb: Secondary | ICD-10-CM | POA: Diagnosis present

## 2016-12-23 DIAGNOSIS — G40909 Epilepsy, unspecified, not intractable, without status epilepticus: Secondary | ICD-10-CM | POA: Diagnosis present

## 2016-12-23 DIAGNOSIS — R338 Other retention of urine: Secondary | ICD-10-CM | POA: Diagnosis present

## 2016-12-23 DIAGNOSIS — K5130 Ulcerative (chronic) rectosigmoiditis without complications: Secondary | ICD-10-CM | POA: Diagnosis present

## 2016-12-23 DIAGNOSIS — J449 Chronic obstructive pulmonary disease, unspecified: Secondary | ICD-10-CM | POA: Diagnosis present

## 2016-12-23 DIAGNOSIS — F319 Bipolar disorder, unspecified: Secondary | ICD-10-CM | POA: Diagnosis present

## 2016-12-23 DIAGNOSIS — K529 Noninfective gastroenteritis and colitis, unspecified: Secondary | ICD-10-CM

## 2016-12-23 DIAGNOSIS — I700 Atherosclerosis of aorta: Secondary | ICD-10-CM | POA: Diagnosis present

## 2016-12-23 DIAGNOSIS — N200 Calculus of kidney: Secondary | ICD-10-CM | POA: Diagnosis present

## 2016-12-23 DIAGNOSIS — N390 Urinary tract infection, site not specified: Principal | ICD-10-CM | POA: Diagnosis present

## 2016-12-23 DIAGNOSIS — R05 Cough: Secondary | ICD-10-CM

## 2016-12-23 DIAGNOSIS — K219 Gastro-esophageal reflux disease without esophagitis: Secondary | ICD-10-CM | POA: Diagnosis present

## 2016-12-23 DIAGNOSIS — Z79899 Other long term (current) drug therapy: Secondary | ICD-10-CM

## 2016-12-23 DIAGNOSIS — E785 Hyperlipidemia, unspecified: Secondary | ICD-10-CM | POA: Diagnosis present

## 2016-12-23 DIAGNOSIS — E876 Hypokalemia: Secondary | ICD-10-CM | POA: Diagnosis present

## 2016-12-23 DIAGNOSIS — Z880 Allergy status to penicillin: Secondary | ICD-10-CM | POA: Diagnosis not present

## 2016-12-23 DIAGNOSIS — D649 Anemia, unspecified: Secondary | ICD-10-CM | POA: Diagnosis present

## 2016-12-23 DIAGNOSIS — F209 Schizophrenia, unspecified: Secondary | ICD-10-CM | POA: Diagnosis present

## 2016-12-23 DIAGNOSIS — Z993 Dependence on wheelchair: Secondary | ICD-10-CM

## 2016-12-23 DIAGNOSIS — R55 Syncope and collapse: Secondary | ICD-10-CM

## 2016-12-23 DIAGNOSIS — N179 Acute kidney failure, unspecified: Secondary | ICD-10-CM | POA: Diagnosis present

## 2016-12-23 DIAGNOSIS — R112 Nausea with vomiting, unspecified: Secondary | ICD-10-CM | POA: Diagnosis not present

## 2016-12-23 DIAGNOSIS — R1084 Generalized abdominal pain: Secondary | ICD-10-CM

## 2016-12-23 LAB — CBC WITH DIFFERENTIAL/PLATELET
Basophils Absolute: 0 10*3/uL (ref 0–0.1)
Basophils Relative: 1 %
EOS ABS: 0 10*3/uL (ref 0–0.7)
Eosinophils Relative: 1 %
HCT: 28.7 % — ABNORMAL LOW (ref 40.0–52.0)
HEMOGLOBIN: 9.6 g/dL — AB (ref 13.0–18.0)
LYMPHS ABS: 1.3 10*3/uL (ref 1.0–3.6)
Lymphocytes Relative: 26 %
MCH: 28.5 pg (ref 26.0–34.0)
MCHC: 33.4 g/dL (ref 32.0–36.0)
MCV: 85.2 fL (ref 80.0–100.0)
MONOS PCT: 6 %
Monocytes Absolute: 0.3 10*3/uL (ref 0.2–1.0)
NEUTROS PCT: 66 %
Neutro Abs: 3.4 10*3/uL (ref 1.4–6.5)
Platelets: 212 10*3/uL (ref 150–440)
RBC: 3.37 MIL/uL — ABNORMAL LOW (ref 4.40–5.90)
RDW: 16.5 % — ABNORMAL HIGH (ref 11.5–14.5)
WBC: 5.1 10*3/uL (ref 3.8–10.6)

## 2016-12-23 LAB — LIPASE, BLOOD: Lipase: 23 U/L (ref 11–51)

## 2016-12-23 LAB — COMPREHENSIVE METABOLIC PANEL
ALK PHOS: 76 U/L (ref 38–126)
ALT: 7 U/L — ABNORMAL LOW (ref 17–63)
ANION GAP: 5 (ref 5–15)
AST: 13 U/L — ABNORMAL LOW (ref 15–41)
Albumin: 2.8 g/dL — ABNORMAL LOW (ref 3.5–5.0)
BILIRUBIN TOTAL: 0.4 mg/dL (ref 0.3–1.2)
BUN: 14 mg/dL (ref 6–20)
CALCIUM: 8.5 mg/dL — AB (ref 8.9–10.3)
CO2: 25 mmol/L (ref 22–32)
Chloride: 111 mmol/L (ref 101–111)
Creatinine, Ser: 1.47 mg/dL — ABNORMAL HIGH (ref 0.61–1.24)
GFR, EST AFRICAN AMERICAN: 53 mL/min — AB (ref >60)
GFR, EST NON AFRICAN AMERICAN: 46 mL/min — AB (ref >60)
Glucose, Bld: 109 mg/dL — ABNORMAL HIGH (ref 65–99)
Potassium: 3.3 mmol/L — ABNORMAL LOW (ref 3.5–5.1)
Sodium: 141 mmol/L (ref 135–145)
Total Protein: 6.6 g/dL (ref 6.5–8.1)

## 2016-12-23 LAB — URINALYSIS, ROUTINE W REFLEX MICROSCOPIC
Bilirubin Urine: NEGATIVE
Glucose, UA: NEGATIVE mg/dL
KETONES UR: NEGATIVE mg/dL
Nitrite: NEGATIVE
PH: 5 (ref 5.0–8.0)
Protein, ur: 30 mg/dL — AB
SPECIFIC GRAVITY, URINE: 1.019 (ref 1.005–1.030)

## 2016-12-23 LAB — TROPONIN I

## 2016-12-23 MED ORDER — LOPERAMIDE HCL 2 MG PO CAPS: 2.0000 mg | ORAL_CAPSULE | ORAL | Status: DC | PRN

## 2016-12-23 MED ORDER — BUPROPION HCL ER (XL) 300 MG PO TB24
300.0000 mg | ORAL_TABLET | Freq: Every day | ORAL | Status: DC
  Administered 2016-12-24 – 2016-12-25 (×2): 300 mg via ORAL
  Filled 2016-12-23 (×2): qty 1

## 2016-12-23 MED ORDER — TAMSULOSIN HCL 0.4 MG PO CAPS
0.4000 mg | ORAL_CAPSULE | Freq: Every day | ORAL | Status: DC
  Administered 2016-12-24 – 2016-12-25 (×2): 0.4 mg via ORAL
  Filled 2016-12-23 (×2): qty 1

## 2016-12-23 MED ORDER — PIPERACILLIN-TAZOBACTAM 3.375 G IVPB
3.3750 g | Freq: Three times a day (TID) | INTRAVENOUS | Status: DC
  Administered 2016-12-23 – 2016-12-24 (×2): 3.375 g via INTRAVENOUS
  Filled 2016-12-23 (×4): qty 50

## 2016-12-23 MED ORDER — TRAMADOL HCL 50 MG PO TABS: 50.0000 mg | ORAL_TABLET | Freq: Four times a day (QID) | ORAL | Status: DC | PRN

## 2016-12-23 MED ORDER — IOPAMIDOL (ISOVUE-300) INJECTION 61%: 80.0000 mL | Freq: Once | INTRAVENOUS | Status: DC | PRN

## 2016-12-23 MED ORDER — ALBUTEROL SULFATE (2.5 MG/3ML) 0.083% IN NEBU: 2.5000 mg | INHALATION_SOLUTION | Freq: Four times a day (QID) | RESPIRATORY_TRACT | Status: DC | PRN

## 2016-12-23 MED ORDER — HEPARIN SODIUM (PORCINE) 5000 UNIT/ML IJ SOLN
5000.0000 [IU] | Freq: Three times a day (TID) | INTRAMUSCULAR | Status: DC
  Administered 2016-12-23 – 2016-12-25 (×5): 5000 [IU] via SUBCUTANEOUS
  Filled 2016-12-23 (×5): qty 1

## 2016-12-23 MED ORDER — SODIUM CHLORIDE 0.9 % IV SOLN
INTRAVENOUS | Status: DC
Start: 2016-12-23 — End: 2016-12-24
  Administered 2016-12-23: 20:00:00 via INTRAVENOUS

## 2016-12-23 MED ORDER — IOPAMIDOL (ISOVUE-300) INJECTION 61%
75.0000 mL | Freq: Once | INTRAVENOUS | Status: AC | PRN
  Administered 2016-12-23: 75 mL via INTRAVENOUS

## 2016-12-23 MED ORDER — IOPAMIDOL (ISOVUE-300) INJECTION 61%
30.0000 mL | Freq: Once | INTRAVENOUS | Status: AC | PRN
  Administered 2016-12-23: 30 mL via ORAL

## 2016-12-23 MED ORDER — PRAVASTATIN SODIUM 40 MG PO TABS
40.0000 mg | ORAL_TABLET | Freq: Every day | ORAL | Status: DC
  Administered 2016-12-23 – 2016-12-25 (×3): 40 mg via ORAL
  Filled 2016-12-23 (×3): qty 1

## 2016-12-23 MED ORDER — METRONIDAZOLE IN NACL 5-0.79 MG/ML-% IV SOLN: 500.0000 mg | Freq: Three times a day (TID) | INTRAVENOUS | Status: DC

## 2016-12-23 MED ORDER — PIPERACILLIN-TAZOBACTAM 3.375 G IVPB 30 MIN
3.3750 g | Freq: Once | INTRAVENOUS | Status: AC
  Administered 2016-12-23: 3.375 g via INTRAVENOUS
  Filled 2016-12-23: qty 50

## 2016-12-23 MED ORDER — DOCUSATE SODIUM 100 MG PO CAPS
100.0000 mg | ORAL_CAPSULE | Freq: Two times a day (BID) | ORAL | Status: DC | PRN
  Administered 2016-12-24: 100 mg via ORAL
  Filled 2016-12-23: qty 1

## 2016-12-23 MED ORDER — POTASSIUM CHLORIDE CRYS ER 20 MEQ PO TBCR
30.0000 meq | EXTENDED_RELEASE_TABLET | Freq: Two times a day (BID) | ORAL | Status: DC
  Administered 2016-12-23 – 2016-12-25 (×4): 30 meq via ORAL
  Filled 2016-12-23 (×4): qty 1

## 2016-12-23 MED ORDER — CIPROFLOXACIN IN D5W 200 MG/100ML IV SOLN: 200.0000 mg | Freq: Two times a day (BID) | INTRAVENOUS | Status: DC

## 2016-12-23 MED ORDER — VITAMIN D 1000 UNITS PO TABS
1000.0000 [IU] | ORAL_TABLET | Freq: Every day | ORAL | Status: DC
  Administered 2016-12-23 – 2016-12-25 (×3): 1000 [IU] via ORAL
  Filled 2016-12-23 (×3): qty 1

## 2016-12-23 MED ORDER — CARVEDILOL 25 MG PO TABS
25.0000 mg | ORAL_TABLET | Freq: Two times a day (BID) | ORAL | Status: DC
  Administered 2016-12-23 – 2016-12-25 (×4): 25 mg via ORAL
  Filled 2016-12-23 (×4): qty 1

## 2016-12-23 MED ORDER — METRONIDAZOLE IN NACL 5-0.79 MG/ML-% IV SOLN
INTRAVENOUS | Status: AC
  Filled 2016-12-23: qty 100

## 2016-12-23 MED ORDER — FINASTERIDE 5 MG PO TABS
5.0000 mg | ORAL_TABLET | Freq: Every day | ORAL | Status: DC
  Administered 2016-12-24 – 2016-12-25 (×2): 5 mg via ORAL
  Filled 2016-12-23 (×2): qty 1

## 2016-12-23 MED ORDER — BUPROPION HCL ER (XL) 300 MG PO TB24: 300.0000 mg | ORAL_TABLET | Freq: Every day | ORAL | Status: DC

## 2016-12-23 MED ORDER — ACETAMINOPHEN 325 MG PO TABS: 650.0000 mg | ORAL_TABLET | Freq: Four times a day (QID) | ORAL | Status: DC | PRN

## 2016-12-23 MED ORDER — POLYETHYLENE GLYCOL 3350 17 G PO PACK: 17.0000 g | PACK | Freq: Every day | ORAL | Status: DC | PRN

## 2016-12-23 MED ORDER — AMLODIPINE BESYLATE 5 MG PO TABS
2.5000 mg | ORAL_TABLET | Freq: Every day | ORAL | Status: DC
  Administered 2016-12-23 – 2016-12-25 (×3): 2.5 mg via ORAL
  Filled 2016-12-23 (×3): qty 1

## 2016-12-23 NOTE — H&P (Signed)
[facility] [facility] [place] [facility]   PATIENT NAME: [name]    MR#:  [id number]  DATE OF BIRTH:  June 04, 1941  DATE OF ADMISSION:  12/23/2016  PRIMARY CARE PHYSICIAN: Angela Coxasanayaka, Gayani Y, MD   REQUESTING/REFERRING PHYSICIAN: Mayford KnifeWilliams  CHIEF COMPLAINT:   Chief Complaint  Patient presents with  . Loss of Consciousness  . Abdominal Pain    HISTORY OF PRESENT ILLNESS: [name]  is a 76 y.o. male with a known history of Bipolar, schizophrenia, COPD, DVT, Htn, CHF, Seizures- [place]- have chronic foley cath.  Today started having vomiting, so sent here. Noted to have UTI and on CT abdomen- proctocolitis.  Denies diarrhea or fever.  PAST MEDICAL HISTORY:   Past Medical History:  Diagnosis Date  . Bipolar 1 disorder (HCC)   . CHF (congestive heart failure) (HCC)   . COPD (chronic obstructive pulmonary disease) (HCC)   . DVT (deep venous thrombosis) (HCC)   . GERD (gastroesophageal reflux disease)   . High cholesterol   . Hypertension   . Schizophrenia (HCC)   . Seizure (HCC)     PAST SURGICAL HISTORY: Past Surgical History:  Procedure Laterality Date  . NO PAST SURGERIES      SOCIAL HISTORY:  Social History  Substance Use Topics  . Smoking status: Never Smoker  . Smokeless tobacco: Never Used  . Alcohol use No    FAMILY HISTORY:  Family History  Problem Relation Age of Onset  . Healthy Mother   . Prostate cancer Neg Hx   . Kidney disease Neg Hx   . Kidney cancer Neg Hx   . Bladder Cancer Neg Hx     DRUG ALLERGIES:  Allergies  Allergen Reactions  . Penicillin G Other (See Comments)    Convulsions. Has patient had a PCN reaction causing immediate rash, facial/tongue/throat swelling, SOB or lightheadedness with hypotension: not sure Has patient had a PCN reaction causing severe rash involving mucus membranes or skin necrosis: not sure Has patient had a PCN reaction that required hospitalization: not sure Has patient had a PCN  reaction occurring within the last 10 years: not sure If all of the above answers are "NO", then may proceed with Cephalosporin use.    REVIEW OF SYSTEMS:   CONSTITUTIONAL: No fever, fatigue or weakness.  EYES: No blurred or double vision.  EARS, NOSE, AND THROAT: No tinnitus or ear pain.  RESPIRATORY: No cough, shortness of breath, wheezing or hemoptysis.  CARDIOVASCULAR: No chest pain, orthopnea, edema.  GASTROINTESTINAL: positive for nausea, vomiting,no diarrhea or abdominal pain.  GENITOURINARY: No dysuria, hematuria.  ENDOCRINE: No polyuria, nocturia,  HEMATOLOGY: No anemia, easy bruising or bleeding SKIN: No rash or lesion. MUSCULOSKELETAL: No joint pain or arthritis.   NEUROLOGIC: No tingling, numbness, weakness.  PSYCHIATRY: No anxiety or depression.   MEDICATIONS AT HOME:  Prior to Admission medications   Medication Sig Start Date End Date Taking? Authorizing Provider  acetaminophen (TYLENOL) 325 MG tablet Take 2 tablets (650 mg total) by mouth every 6 (six) hours as needed. 04/26/16  Yes Wieting, Richard, MD  albuterol (VENTOLIN HFA) 108 (90 Base) MCG/ACT inhaler Inhale 2 puffs into the lungs every 6 (six) hours as needed for shortness of breath.   Yes [provider]  amLODipine (NORVASC) 2.5 MG tablet Take 1 tablet (2.5 mg total) by mouth daily. 04/26/16  Yes Wieting, Richard, MD  buPROPion (BUDEPRION XL) 300 MG 24 hr tablet Take 1 tablet (300 mg total) by mouth  daily. 04/26/16  Yes Wieting, Richard, MD  carvedilol (COREG) 25 MG tablet Take 1 tablet (25 mg total) by mouth 2 (two) times daily. 04/26/16  Yes Wieting, Richard, MD  cholecalciferol (VITAMIN D) 1000 units tablet Take 1,000 Units by mouth daily.   Yes [provider]  finasteride (PROSCAR) 5 MG tablet Take 1 tablet (5 mg total) by mouth daily. 04/27/16  Yes Wieting, Richard, MD  furosemide (LASIX) 20 MG tablet Take 20 mg by mouth daily.   Yes [provider]  loperamide (IMODIUM A-D) 2 MG  tablet Take 2-4 mg by mouth as needed for diarrhea or loose stools. 4 mg at first episode of diarrhea, then 2 mg for each subsequent episode as needed   Yes [provider]  omeprazole (PRILOSEC) 20 MG capsule Take 2 capsules (40 mg total) by mouth daily. Patient taking differently: Take 40 mg by mouth at bedtime.  04/26/16  Yes Wieting, Richard, MD  PHENobarbital (LUMINAL) 64.8 MG tablet Take 2 tablets (129.6 mg total) by mouth every other day. 04/26/16  Yes Wieting, Richard, MD  polyethylene glycol (MIRALAX / GLYCOLAX) packet Take 17 g by mouth daily as needed for moderate constipation. 04/26/16  Yes Wieting, Richard, MD  potassium chloride SA (K-DUR,KLOR-CON) 20 MEQ tablet Take 30 mEq by mouth 2 (two) times daily.   Yes [provider]  pravastatin (PRAVACHOL) 40 MG tablet Take 1 tablet (40 mg total) by mouth daily. 04/26/16  Yes Wieting, Richard, MD  tamsulosin (FLOMAX) 0.4 MG CAPS capsule Take 1 capsule (0.4 mg total) by mouth daily. 06/16/16  Yes McGowan, Carollee Herter A, PA-C  traMADol (ULTRAM) 50 MG tablet Take 1 tablet (50 mg total) by mouth every 6 (six) hours as needed for moderate pain or severe pain. 09/24/16  Yes Gouru, Deanna Artis, MD  apixaban (ELIQUIS) 5 MG TABS tablet Take 1 tablet (5 mg total) by mouth 2 (two) times daily. Patient not taking: Reported on 11/25/2016 05/31/16   Katharina Caper, MD  ARIPiprazole (ABILIFY) 20 MG tablet Take 1 tablet (20 mg total) by mouth daily. Patient not taking: Reported on 11/25/2016 04/26/16   Alford Highland, MD      PHYSICAL EXAMINATION:   VITAL SIGNS: Blood pressure (!) 148/85, pulse 72, temperature 97.4 F (36.3 C), temperature source Oral, resp. rate 12, height 5\' 10"  (1.778 m), weight 114.8 kg (253 lb), SpO2 100 %.  GENERAL:  76 y.o.-year-old patient lying in the bed with no acute distress.  EYES: Pupils equal, round, reactive to light and accommodation. No scleral icterus. Extraocular muscles intact.  HEENT: Head atraumatic,  normocephalic. Oropharynx and nasopharynx clear.  NECK:  Supple, no jugular venous distention. No thyroid enlargement, no tenderness.  LUNGS: Normal breath sounds bilaterally, no wheezing, rales,rhonchi or crepitation. No use of accessory muscles of respiration.  CARDIOVASCULAR: S1, S2 normal. No murmurs, rubs, or gallops.  ABDOMEN: Soft, nontender, nondistended. Bowel sounds present. No organomegaly or mass. Foley in place. EXTREMITIES: b/l pedal edema, no cyanosis, or clubbing. Unna boot on right leg and swelling and some redness on left leg. NEUROLOGIC: Cranial nerves II through XII are intact. Muscle strength 5/5 in all upper extremities 4/5 lower both side. Sensation intact. Gait not checked.  PSYCHIATRIC: The patient is alert and oriented x 3.  SKIN: No obvious rash, lesion, or ulcer.   LABORATORY PANEL:   CBC  Recent Labs Lab 12/23/16 1447  WBC 5.1  HGB 9.6*  HCT 28.7*  PLT 212  MCV 85.2  MCH 28.5  MCHC  33.4  RDW 16.5*  LYMPHSABS 1.3  MONOABS 0.3  EOSABS 0.0  BASOSABS 0.0   ------------------------------------------------------------------------------------------------------------------  Chemistries   Recent Labs Lab 12/23/16 1447  NA 141  K 3.3*  CL 111  CO2 25  GLUCOSE 109*  BUN 14  CREATININE 1.47*  CALCIUM 8.5*  AST 13*  ALT 7*  ALKPHOS 76  BILITOT 0.4   ------------------------------------------------------------------------------------------------------------------ estimated creatinine clearance is 56.8 mL/min (A) (by C-G formula based on SCr of 1.47 mg/dL (H)). ------------------------------------------------------------------------------------------------------------------ No results for input(s): TSH, T4TOTAL, T3FREE, THYROIDAB in the last 72 hours.  Invalid input(s): FREET3   Coagulation profile No results for input(s): INR, PROTIME in the last 168  hours. ------------------------------------------------------------------------------------------------------------------- No results for input(s): DDIMER in the last 72 hours. -------------------------------------------------------------------------------------------------------------------  Cardiac Enzymes  Recent Labs Lab 12/23/16 1447  TROPONINI <0.03   ------------------------------------------------------------------------------------------------------------------ Invalid input(s): POCBNP  ---------------------------------------------------------------------------------------------------------------  Urinalysis    Component Value Date/Time   COLORURINE YELLOW (A) 12/23/2016 1653   APPEARANCEUR CLOUDY (A) 12/23/2016 1653   APPEARANCEUR Hazy 10/02/2012 0915   LABSPEC 1.019 12/23/2016 1653   LABSPEC 1.027 10/02/2012 0915   PHURINE 5.0 12/23/2016 1653   GLUCOSEU NEGATIVE 12/23/2016 1653   GLUCOSEU Negative 10/02/2012 0915   HGBUR MODERATE (A) 12/23/2016 1653   BILIRUBINUR NEGATIVE 12/23/2016 1653   BILIRUBINUR Negative 10/02/2012 0915   KETONESUR NEGATIVE 12/23/2016 1653   PROTEINUR 30 (A) 12/23/2016 1653   NITRITE NEGATIVE 12/23/2016 1653   LEUKOCYTESUR LARGE (A) 12/23/2016 1653   LEUKOCYTESUR 1+ 10/02/2012 0915     RADIOLOGY: Ct Abdomen Pelvis W Contrast  Result Date: 12/23/2016 CLINICAL DATA:  Mid abdominal pain and cramping.  Syncope. EXAM: CT ABDOMEN AND PELVIS WITH CONTRAST TECHNIQUE: Multidetector CT imaging of the abdomen and pelvis was performed using the standard protocol following bolus administration of intravenous contrast. CONTRAST:  75 mL Isovue-300 COMPARISON:  Right upper quadrant abdominal ultrasound 05/26/2016. CT abdomen and pelvis 02/21/2015. FINDINGS: Lower chest: Trace right and small left pleural effusions. Minimal bibasilar atelectasis. Normal heart size. Hepatobiliary: No focal liver abnormality is seen. The gallbladder is grossly unremarkable,  with assessment limited by underdistention and mild motion artifact. No significant biliary dilatation is seen. Pancreas: Mild diffuse atrophy without ductal dilatation. Mild stranding inferior to the pancreatic tail is not felt to be pancreatic in origin. Spleen: Unremarkable. Adrenals/Urinary Tract: Unremarkable adrenal glands. Nonobstructing right renal calculi measuring up to 1.3 cm in size, overall slightly increased from the prior CT. 5.1 cm right upper pole renal cyst has mildly enlarged as have smaller cysts in the right lower pole measuring up to 2 cm in size. Small cysts in the left kidney measure up to 2.2 cm. No hydronephrosis. Poor intravascular enhancement on the portal venous phase images with no significant contrast excretion on delayed images, possibly reflecting an issue with the contrast injection. Foley catheter and small amount of gas in the bladder. Stomach/Bowel: The stomach is within normal limits. Oral contrast is present in multiple nondilated small bowel loops without evidence of obstruction. There is a moderate amount of stool in the ascending and transverse colon. The rectum and distal sigmoid colon are mildly distended by fluid with mild diffuse wall thickening and moderate surrounding inflammatory change. The more proximal sigmoid colon is very redundant and mildly to moderately distended by gas. There is mild inflammatory stranding which extends more superiorly throughout the mesenteric fat, more notable on the left with a trace amount of fluid at the level of the splenic flexure. The appendix is unremarkable. Vascular/Lymphatic:  Moderate aortoiliac atherosclerosis without aneurysm. No enlarged lymph nodes. Reproductive:  Unremarkable prostate. Other: Moderate to prominent diffuse body wall edema. Musculoskeletal: Thoracolumbar spondylosis. Moderate to advanced lumbar facet arthrosis. IMPRESSION: 1. Wall thickening and surrounding inflammation involving the distal sigmoid colon and  rectum compatible with proctocolitis. 2. Trace pleural effusions.  Diffuse body wall edema. 3. Nonobstructing right nephrolithiasis. 4.  Aortic Atherosclerosis (ICD10-I70.0). Electronically Signed   By: Sebastian Ache M.D.   On: 12/23/2016 16:16    EKG: Orders placed or performed during the hospital encounter of 12/23/16  . ED EKG  . ED EKG  . EKG 12-Lead  . EKG 12-Lead    IMPRESSION AND PLAN:  * UTi    Ch foley   Cipro IV for now.    Zosyn given by ER   Will cont that.    cx sent  * Proctocolitis   Zosyn for now, wait for urine cx, may switch to oral cipro after that.  * Ac renal failure   Hold lasix   IV fluids  * Hypoaklemia   Replace oral.  * htn    Cont meds except lasix  * Hyperlipidemia   Cont statin.  All the records are reviewed and case discussed with ED provider. Management plans discussed with the patient, family and they are in agreement.  CODE STATUS: full. Code Status History    Date Active Date Inactive Code Status Order ID Comments User Context   09/22/2016 12:23 PM 09/24/2016  7:53 PM Full Code [id number]  Alford Highland, MD ED   05/24/2016  2:53 PM 05/28/2016  6:19 PM DNR [id number]  Alford Highland, MD Inpatient   05/21/2016  7:36 PM 05/24/2016  2:53 PM Full Code [id number]  Alford Highland, MD ED   04/23/2016  4:22 PM 04/24/2016 12:58 PM Full Code [id number]  Auburn Bilberry, MD ED   02/21/2015  3:37 PM 02/24/2015  4:42 PM Full Code [id number]  Shaune Pollack, MD Inpatient    Advance Directive Documentation     Most Recent Value  Type of Advance Directive  Living will  Pre-existing out of facility DNR order (yellow form or pink MOST form)  -  "MOST" Form in Place?  -       TOTAL TIME TAKING CARE OF THIS PATIENT: 45 minutes.    Altamese Dilling M.D on 12/23/2016   Between 7am to 6pm - Pager - [phone]  After 6pm go to [email or web address] - password [email or web address]  [facility]  Office  [phone]  CC: Primary care physician;  Angela Cox, MD   Note: This dictation was prepared with Dragon dictation along with smaller phrase technology. Any transcriptional errors that result from this process are unintentional.

## 2016-12-23 NOTE — ED Notes (Signed)
Attempted to call report. Was informed RN was unavailable.  RN was informed that call would be returned

## 2016-12-23 NOTE — ED Notes (Signed)
Pt visualized in NAD at this time. Resting in bed. Pt remains alert and oriented. Explained to patient would be moving him into a room to place a new foley after speaking with MD. Pt states understanding.

## 2016-12-23 NOTE — Progress Notes (Signed)
Pharmacy Antibiotic Note  [name] is a 76 y.o. male admitted on 12/23/2016 with  Intra-abdominal infection and UTI.  Pharmacy has been consulted for Zosyn dosing.  Note: patient has a listed allergy to PCN. Allergy is reported as unknown and patient does not remember reaction. Patient received Zosyn 3.375 IV in ED at  1730 and did not have any reaction.   Plan: Will start patient on Zosyn 3.375 IV EI every 8 hours. Continue to monitor patient of S/Sx of allergic reaction to antibiotic.   Height: 5\' 10"  (177.8 cm) Weight: 253 lb (114.8 kg) IBW/kg (Calculated) : 73  Temp (24hrs), Avg:97.4 F (36.3 C), Min:97.4 F (36.3 C), Max:97.4 F (36.3 C)   Recent Labs Lab 12/23/16 1447  WBC 5.1  CREATININE 1.47*    Estimated Creatinine Clearance: 56.8 mL/min (A) (by C-G formula based on SCr of 1.47 mg/dL (H)).    Allergies  Allergen Reactions  . Penicillin G Other (See Comments)    Convulsions. Has patient had a PCN reaction causing immediate rash, facial/tongue/throat swelling, SOB or lightheadedness with hypotension: not sure Has patient had a PCN reaction causing severe rash involving mucus membranes or skin necrosis: not sure Has patient had a PCN reaction that required hospitalization: not sure Has patient had a PCN reaction occurring within the last 10 years: not sure If all of the above answers are "NO", then may proceed with Cephalosporin use.    Antimicrobials this admission: 5/17 Zosyn  >>   Dose adjustments this admission  Microbiology results: 5/17 UCx: sent  Thank you for allowing pharmacy to be a part of this patient's care.  Gardner CandleSheema M Sovereign Ramiro, PharmD, BCPS Clinical Pharmacist 12/23/2016 7:42 PM

## 2016-12-23 NOTE — ED Provider Notes (Signed)
[facility] Emergency Department Provider Note       Time seen: ----------------------------------------- 1:18 PM on 12/23/2016 -----------------------------------------  L5 caveat: Review of systems and history is limited by schizophrenia   I have reviewed the triage vital signs and the nursing notes.   HISTORY   Chief Complaint No chief complaint on file.    HPI [name] is a 76 y.o. male who presents to the ED for abdominal pain and vomiting. Patient had had reported syncope as well but he does not remember this. Nursing home staff was ruling him out for a meal when they could not wake him up. There was concern for syncope. Again the patient cannot elaborate and does not remember this event.   Past Medical History:  Diagnosis Date  . Bipolar 1 disorder (HCC)   . CHF (congestive heart failure) (HCC)   . COPD (chronic obstructive pulmonary disease) (HCC)   . DVT (deep venous thrombosis) (HCC)   . GERD (gastroesophageal reflux disease)   . High cholesterol   . Hypertension   . Schizophrenia (HCC)   . Seizure [facility](HCC)     Patient Active Problem List   Diagnosis Date Noted  . Acute posthemorrhagic anemia 11/25/2016  . Hematuria 11/25/2016  . Guaiac positive stools 11/25/2016  . Acute renal insufficiency 11/25/2016  . Pressure injury of skin 09/23/2016  . Back pain 05/28/2016  . Sepsis (HCC) 05/27/2016  . UTI (urinary tract infection) 05/27/2016  . Escherichia coli (E. coli) infection 05/27/2016  . DVT of lower extremity, bilateral (HCC) 05/27/2016  . 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 BPH (benign prostatic hyperplasia) 05/27/2016  . Acute on chronic renal failure (HCC) 05/27/2016  . Bipolar disorder (HCC) 05/27/2016  . Generalized weakness 05/27/2016  . 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 C. difficile colitis 05/27/2016  . Acute kidney injury (HCC) 05/21/2016  . UTI (lower urinary tract infection) 04/23/2016  .  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 Pneumonia 02/21/2015  . ARF (acute renal failure) (HCC) 02/21/2015  . Hypokalemia 02/21/2015  . Leukocytosis 02/21/2015  . Venous insufficiency of both lower extremities 12/04/2014  . Benign essential hypertension 11/25/2014  . Bilateral carotid artery stenosis 11/15/2014  . Bilateral leg edema 11/15/2014  . COPD (chronic obstructive pulmonary disease) (HCC) 11/15/2014  . Gastroesophageal reflux disease 11/15/2014  . Mild aortic valve stenosis 11/15/2014  . Mixed hyperlipidemia 11/15/2014  . Obesity 11/15/2014  . Seizure disorder (HCC) 11/15/2014    Past Surgical History:  Procedure Laterality Date  . NO PAST SURGERIES      Allergies Penicillin g  Social History Social History  Substance Use Topics  . Smoking status: Never Smoker  . Smokeless tobacco: Never Used  . Alcohol use No    Review of Systems Cardiovascular: Negative for chest pain.Positive for possible syncope Respiratory: Negative for shortness of breath. Gastrointestinal: Positive for abdominal pain, vomiting  All systems negative/normal/unremarkable or unknown except as stated in the HPI  ____________________________________________   PHYSICAL EXAM:  VITAL SIGNS: ED Triage Vitals  Enc Vitals Group     BP      Pulse      Resp      Temp      Temp src      SpO2      Weight      Height      Head Circumference      Peak Flow      Pain  Score      Pain Loc      Pain Edu?      Excl. in GC?     Constitutional: Alert, and in no distress. Eyes: Conjunctivae are Pale. Normal extraocular movements. ENT   Head: Normocephalic and atraumatic.   Nose: No congestion/rhinnorhea.   Mouth/Throat: Mucous membranes are moist.   Neck: No stridor. Cardiovascular: Normal rate, regular rhythm. No murmurs, rubs, or gallops. Respiratory: Normal respiratory effort without tachypnea nor retractions. Breath sounds are clear  and equal bilaterally. No wheezes/rales/rhonchi. Gastrointestinal: Soft and nontender. Hyperactive bowel sounds, no focal tenderness Musculoskeletal: Nontender with normal range of motion in extremities. Bilateral lower extremity edema Neurologic:  Normal speech and language. No gross focal neurologic deficits are appreciated.  Skin:  Skin is warm, dry and intact. No rash noted. Psychiatric: Mood and affect are normal. Speech and behavior are normal.  ____________________________________________  EKG: Interpreted by me. Junctional rhythm with a rate of 62 bpm, normal QRS size, normal QT, nonspecific T-wave changes  ____________________________________________  ED COURSE:  Pertinent labs & imaging results that were available during my care of the patient were reviewed by me and considered in my medical decision making (see chart for details). Patient presents for syncope with abdominal pain, we will assess with labs and imaging as indicated.   Procedures ____________________________________________   LABS (pertinent positives/negatives)  Labs Reviewed  CBC WITH DIFFERENTIAL/PLATELET - Abnormal; Notable for the following:       Result Value   RBC 3.37 (*)    Hemoglobin 9.6 (*)    HCT 28.7 (*)    RDW 16.5 (*)    All other components within normal limits  COMPREHENSIVE METABOLIC PANEL - Abnormal; Notable for the following:    Potassium 3.3 (*)    Glucose, Bld 109 (*)    Creatinine, Ser 1.47 (*)    Calcium 8.5 (*)    Albumin 2.8 (*)    AST 13 (*)    ALT 7 (*)    GFR calc non Af Amer 46 (*)    GFR calc Af Amer 53 (*)    All other components within normal limits  LIPASE, BLOOD  TROPONIN I  CBC WITH DIFFERENTIAL/PLATELET  URINALYSIS, COMPLETE (UACMP) WITH MICROSCOPIC  URINALYSIS, ROUTINE W REFLEX MICROSCOPIC    RADIOLOGY Images were viewed by me  CT the abdomen and pelvis with contrast IMPRESSION: 1. Wall thickening and surrounding inflammation involving the  distal sigmoid colon and rectum compatible with proctocolitis. 2. Trace pleural effusions. Diffuse body wall edema. 3. Nonobstructing right nephrolithiasis. 4. Aortic Atherosclerosis (ICD10-I70.0). ____________________________________________  FINAL ASSESSMENT AND PLAN  Abdominal pain, vomiting, Proctocolitis, UTI  Plan: Patient's labs and imaging were dictated above. Patient had presented for abdominal pain and syncope. On CT he has wall thickening and surrounding inflammatory findings in the distal sigmoid and rectum compatible with proctocolitis. He also has evidence of UTI with chronic indwelling Foley. We changed his Foley catheter, he has received IV Zosyn which should cover his proctocolitis and urine. I will discuss with the hospitalist for observation.   Emily Filbert, MD   Note: This note was generated in part or whole with voice recognition software. Voice recognition is usually quite accurate but there are transcription errors that can and very often do occur. I apologize for any typographical errors that were not detected and corrected.     Emily Filbert, MD 12/23/16 [phone]

## 2016-12-23 NOTE — ED Notes (Signed)
Pt taken to CT.

## 2016-12-23 NOTE — ED Notes (Signed)
Pt visualized in NAD at this time. Pt resting in bed, respirations even and unlabored. No change in patient condition. Will continue to monitor for further patient needs.

## 2016-12-23 NOTE — ED Notes (Signed)
Pt given coke per his request.

## 2016-12-23 NOTE — ED Triage Notes (Signed)
[facility]. Per EMS pt had syncopal episode when attempting to go to lunch, pt states that he is here for mid abdominal pain that is 6/10 and cramping. Pt is alert and oriented at this time.

## 2016-12-24 ENCOUNTER — Ambulatory Visit: Payer: Medicare Other | Admitting: Surgery

## 2016-12-24 LAB — CBC
HEMATOCRIT: 24.7 % — AB (ref 40.0–52.0)
Hemoglobin: 8.2 g/dL — ABNORMAL LOW (ref 13.0–18.0)
MCH: 27.7 pg (ref 26.0–34.0)
MCHC: 33.3 g/dL (ref 32.0–36.0)
MCV: 83.2 fL (ref 80.0–100.0)
Platelets: 192 10*3/uL (ref 150–440)
RBC: 2.97 MIL/uL — AB (ref 4.40–5.90)
RDW: 16.2 % — ABNORMAL HIGH (ref 11.5–14.5)
WBC: 4.5 10*3/uL (ref 3.8–10.6)

## 2016-12-24 LAB — BASIC METABOLIC PANEL
Anion gap: 4 — ABNORMAL LOW (ref 5–15)
BUN: 14 mg/dL (ref 6–20)
CHLORIDE: 111 mmol/L (ref 101–111)
CO2: 25 mmol/L (ref 22–32)
Calcium: 7.8 mg/dL — ABNORMAL LOW (ref 8.9–10.3)
Creatinine, Ser: 1.36 mg/dL — ABNORMAL HIGH (ref 0.61–1.24)
GFR calc Af Amer: 58 mL/min — ABNORMAL LOW (ref >60)
GFR calc non Af Amer: 50 mL/min — ABNORMAL LOW (ref >60)
Glucose, Bld: 85 mg/dL (ref 65–99)
POTASSIUM: 2.8 mmol/L — AB (ref 3.5–5.1)
SODIUM: 140 mmol/L (ref 135–145)

## 2016-12-24 LAB — MRSA PCR SCREENING: MRSA by PCR: NEGATIVE

## 2016-12-24 MED ORDER — ADULT MULTIVITAMIN W/MINERALS CH
1.0000 | ORAL_TABLET | Freq: Every day | ORAL | Status: DC
  Administered 2016-12-24 – 2016-12-25 (×2): 1 via ORAL
  Filled 2016-12-24 (×2): qty 1

## 2016-12-24 MED ORDER — POTASSIUM CHLORIDE 10 MEQ/100ML IV SOLN
10.0000 meq | INTRAVENOUS | Status: AC
  Administered 2016-12-24 (×3): 10 meq via INTRAVENOUS
  Filled 2016-12-24 (×3): qty 100

## 2016-12-24 MED ORDER — ENSURE ENLIVE PO LIQD
237.0000 mL | Freq: Two times a day (BID) | ORAL | Status: DC
Start: 2016-12-24 — End: 2016-12-25
  Administered 2016-12-24 – 2016-12-25 (×2): 237 mL via ORAL

## 2016-12-24 MED ORDER — MAGNESIUM SULFATE 2 GM/50ML IV SOLN
2.0000 g | Freq: Once | INTRAVENOUS | Status: AC
  Administered 2016-12-24: 2 g via INTRAVENOUS
  Filled 2016-12-24: qty 50

## 2016-12-24 MED ORDER — CIPROFLOXACIN HCL 500 MG PO TABS
500.0000 mg | ORAL_TABLET | Freq: Two times a day (BID) | ORAL | Status: DC
  Administered 2016-12-24 – 2016-12-25 (×2): 500 mg via ORAL
  Filled 2016-12-24 (×2): qty 1

## 2016-12-24 NOTE — Plan of Care (Signed)
Problem: Education: Goal: Knowledge of [facility] information/materials will improve Outcome: Progressing Pt oriented to unit, including call bell, FPP.  VSS, free of falls during shift.  Denies pain, no needs overnight.  Pt overall compliant w/ q2h repositioning.  Bed in low position, call bell within reach.  WCTM

## 2016-12-24 NOTE — Progress Notes (Signed)
[facility]  Patient Status: Active  [facility] is providing the following services: SN, PT not started yet  If patient discharges after hours, please call [phone].   Scherrie GerlachJason E Hinton 12/24/2016, 1:09 PM

## 2016-12-24 NOTE — Consult Note (Signed)
WOC Nurse wound consult note Reason for Consult: HAs been seeing Dr. 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  WOC nurse is on a different [facility] at this time.  Encouraged to follow up with wound care center Monday.  Has Aquacel Ag (antimicrobial 7 day dressing) in place.  Will not follow at this time.  Please re-consult if needed.  Maple HudsonKaren Conner Neiss RN BSN CWON Pager [phone]

## 2016-12-24 NOTE — Progress Notes (Signed)
Patient ID: [name], male   DOB: 11-Aug-1940, 76 y.o.   MRN: [id number]  [facility] PROGRESS NOTE  [name]:[id number] DOB: 15-Jan-1941 DOA: 12/23/2016 PCP: Angela Cox, MD  HPI/Subjective: Patient had a fall and was brought in. Patient states nothing hurts. He feels okay. Does have some abdominal pain though. No diarrhea.  Objective: Vitals:   12/24/16 0935 12/24/16 1304  BP: 131/74 119/63  Pulse: 70 70  Resp: 20 20  Temp: 98.4 F (36.9 C) 98.5 F (36.9 C)    Filed Weights   12/23/16 1329 12/24/16 1136  Weight: 114.8 kg (253 lb) 106.9 kg (235 lb 11.2 oz)    ROS: Review of Systems  Constitutional: Negative for chills and fever.  Eyes: Negative for blurred vision.  Respiratory: Negative for cough and shortness of breath.   Cardiovascular: Negative for chest pain.  Gastrointestinal: Positive for abdominal pain. Negative for constipation, diarrhea, nausea and vomiting.  Genitourinary: Negative for dysuria.  Musculoskeletal: Negative for joint pain.  Neurological: Negative for dizziness and headaches.   Exam: Physical Exam  HENT:  Nose: No mucosal edema.  Mouth/Throat: No oropharyngeal exudate or posterior oropharyngeal edema.  Eyes: Conjunctivae, EOM and lids are normal. Pupils are equal, round, and reactive to light.  Neck: No JVD present. Carotid bruit is not present. No edema present. No thyroid mass and no thyromegaly present.  Cardiovascular: S1 normal and S2 normal.  Exam reveals no gallop.   No murmur heard. Pulses:      Dorsalis pedis pulses are 2+ on the right side, and 2+ on the left side.  Respiratory: No respiratory distress. He has no wheezes. He has no rhonchi. He has no rales.  GI: Soft. Bowel sounds are normal. There is no tenderness.  Musculoskeletal:       Right shoulder: He exhibits no swelling.  Lymphadenopathy:    He has no cervical adenopathy.  Neurological: He is alert. No cranial nerve deficit.  Skin: Skin is  warm. Nails show no clubbing.  Right leg covered by Roland Rack boot. Left leg erythema on the shin  Psychiatric: He has a normal mood and affect.      Data Reviewed: Basic Metabolic Panel:  Recent Labs Lab 12/23/16 1447 12/24/16 0550  NA 141 140  K 3.3* 2.8*  CL 111 111  CO2 25 25  GLUCOSE 109* 85  BUN 14 14  CREATININE 1.47* 1.36*  CALCIUM 8.5* 7.8*   Liver Function Tests:  Recent Labs Lab 12/23/16 1447  AST 13*  ALT 7*  ALKPHOS 76  BILITOT 0.4  PROT 6.6  ALBUMIN 2.8*    Recent Labs Lab 12/23/16 1447  LIPASE 23   CBC:  Recent Labs Lab 12/23/16 1447 12/24/16 0550  WBC 5.1 4.5  NEUTROABS 3.4  --   HGB 9.6* 8.2*  HCT 28.7* 24.7*  MCV 85.2 83.2  PLT 212 192   Cardiac Enzymes:  Recent Labs Lab 12/23/16 1447  TROPONINI <0.03   BNP (last 3 results)  Recent Labs  05/21/16 1756 09/22/16 1010  BNP 92.0 122.0*      Studies: Ct Abdomen Pelvis W Contrast  Result Date: 12/23/2016 CLINICAL DATA:  Mid abdominal pain and cramping.  Syncope. EXAM: CT ABDOMEN AND PELVIS WITH CONTRAST TECHNIQUE: Multidetector CT imaging of the abdomen and pelvis was performed using the standard protocol following bolus administration of intravenous contrast. CONTRAST:  75 mL Isovue-300 COMPARISON:  Right upper quadrant abdominal ultrasound 05/26/2016. CT abdomen and pelvis 02/21/2015. FINDINGS: Lower  chest: Trace right and small left pleural effusions. Minimal bibasilar atelectasis. Normal heart size. Hepatobiliary: No focal liver abnormality is seen. The gallbladder is grossly unremarkable, with assessment limited by underdistention and mild motion artifact. No significant biliary dilatation is seen. Pancreas: Mild diffuse atrophy without ductal dilatation. Mild stranding inferior to the pancreatic tail is not felt to be pancreatic in origin. Spleen: Unremarkable. Adrenals/Urinary Tract: Unremarkable adrenal glands. Nonobstructing right renal calculi measuring up to 1.3 cm in size,  overall slightly increased from the prior CT. 5.1 cm right upper pole renal cyst has mildly enlarged as have smaller cysts in the right lower pole measuring up to 2 cm in size. Small cysts in the left kidney measure up to 2.2 cm. No hydronephrosis. Poor intravascular enhancement on the portal venous phase images with no significant contrast excretion on delayed images, possibly reflecting an issue with the contrast injection. Foley catheter and small amount of gas in the bladder. Stomach/Bowel: The stomach is within normal limits. Oral contrast is present in multiple nondilated small bowel loops without evidence of obstruction. There is a moderate amount of stool in the ascending and transverse colon. The rectum and distal sigmoid colon are mildly distended by fluid with mild diffuse wall thickening and moderate surrounding inflammatory change. The more proximal sigmoid colon is very redundant and mildly to moderately distended by gas. There is mild inflammatory stranding which extends more superiorly throughout the mesenteric fat, more notable on the left with a trace amount of fluid at the level of the splenic flexure. The appendix is unremarkable. Vascular/Lymphatic: Moderate aortoiliac atherosclerosis without aneurysm. No enlarged lymph nodes. Reproductive:  Unremarkable prostate. Other: Moderate to prominent diffuse body wall edema. Musculoskeletal: Thoracolumbar spondylosis. Moderate to advanced lumbar facet arthrosis. IMPRESSION: 1. Wall thickening and surrounding inflammation involving the distal sigmoid colon and rectum compatible with proctocolitis. 2. Trace pleural effusions.  Diffuse body wall edema. 3. Nonobstructing right nephrolithiasis. 4.  Aortic Atherosclerosis (ICD10-I70.0). Electronically Signed   By: Sebastian Ache M.D.   On: 12/23/2016 16:16    Scheduled Meds: . amLODipine  2.5 mg Oral Daily  . buPROPion  300 mg Oral Daily  . carvedilol  25 mg Oral BID  . cholecalciferol  1,000 Units Oral  Daily  . ciprofloxacin  500 mg Oral BID  . feeding supplement (ENSURE ENLIVE)  237 mL Oral BID BM  . finasteride  5 mg Oral Daily  . heparin  5,000 Units Subcutaneous Q8H  . multivitamin with minerals  1 tablet Oral Daily  . potassium chloride SA  30 mEq Oral BID  . pravastatin  40 mg Oral Daily  . tamsulosin  0.4 mg Oral Daily   Continuous Infusions: . magnesium sulfate 1 - 4 g bolus IVPB    . potassium chloride 10 mEq (12/24/16 1315)    Assessment/Plan:  1. Hypokalemia. Potassium 2.8 today. IV and oral potassium supplementation. Also give IV magnesium and check a magnesium and potassium tomorrow morning.  2. Proctocolitis seen on CT scan, left lower extremity cellulitis. Switch IV Zosyn over to by mouth Cipro 3. Chronic Foley catheter. I would not treat this urine culture results because the patient was not septic. Foley catheter changed in the emergency room. 4. Essential hypertension on Coreg and Norvasc 5. BPH on Flomax and finasteride 6. Anasarca lower extremities Lasix on hold 7. Hyperlipidemia unspecified on pravastatin 8. Depression on Wellbutrin 9. Anemia. Send off a ferritin. History of GI bleed recently. Looks like recently signed out AGAINST MEDICAL ADVICE from  the ER. Serial hemoglobins. 10. History of DVT. No anticoagulation with recent GI bleed.  May end up needing an IVC filter   Code Status:     Code Status Orders        Start     Ordered   12/23/16 2151  Full code  Continuous     12/23/16 2151    Code Status History    Date Active Date Inactive Code Status Order ID Comments User Context   09/22/2016 12:23 PM 09/24/2016  7:53 PM Full Code [id number]  Alford HighlandWieting, Haytham Maher, MD ED   05/24/2016  2:53 PM 05/28/2016  6:19 PM DNR [id number]  Alford HighlandWieting, Gwenlyn Hottinger, MD Inpatient   05/21/2016  7:36 PM 05/24/2016  2:53 PM Full Code [id number]  Alford HighlandWieting, Sophronia Varney, MD ED   04/23/2016  4:22 PM 04/24/2016 12:58 PM Full Code [id number]  Auburn BilberryPatel, Shreyang, MD ED   02/21/2015  3:37 PM  02/24/2015  4:42 PM Full Code [id number]  Shaune Pollackhen, Qing, MD Inpatient    Advance Directive Documentation     Most Recent Value  Type of Advance Directive  Living will  Pre-existing out of facility DNR order (yellow form or pink MOST form)  -  "MOST" Form in Place?  -      Disposition Plan: To be determined based on laboratory data  Antibiotics:  By mouth Cipro  Time spent: 28 minutes  Alford HighlandWIETING, Malary Aylesworth  [employer]

## 2016-12-24 NOTE — Progress Notes (Signed)
Initial Nutrition Assessment  DOCUMENTATION CODES:   Obesity unspecified  INTERVENTION:   Ensure Enlive po BID, each supplement provides 350 kcal and 20 grams of protein  MVI  NUTRITION DIAGNOSIS:   Unintentional weight loss related to poor appetite as evidenced by 13 percent weight loss in 6 months.  GOAL:   Patient will meet greater than or equal to 90% of their needs  MONITOR:   PO intake, Supplement acceptance, Labs, Weight trends, Skin  REASON FOR ASSESSMENT:   Malnutrition Screening Tool    ASSESSMENT:   76 y.o. male with a known history of Bipolar, schizophrenia, COPD, DVT, Htn, CHF, Seizures- lives in  [place]- have chronic foley cath admitted for UTI, proctocolitis, and ARF    Met with pt in room today. Pt reports poor appetite and oral intake for several months pta. Pt reports that he just doesn't feel hungry sometimes. Pt reports that he has lost 20lbs over the past few months. Per chart, pt has lost 36lbs(13%) in 6 months; this is significant. Pt is currently eating 100% meals. RD will order Ensure to help pt preserve lean muscle mass. Pt with low potassium; monitor and supplement as needed per MD discretion.   Medications reviewed and include: Vit D, heparin, KCl, zosyn  Labs reviewed: K 2.8(L), creat 1.36(H), Ca 7.8(L) adj. 8.76(L), alb 2.8(L)-5/17 Hgb 8.2(L), Hct 24.7(L)  Nutrition-Focused physical exam completed. Findings are no fat depletion, no muscle depletion, and mild edema.   Diet Order:  Diet Heart Room service appropriate? Yes; Fluid consistency: Thin  Skin:  Wound (see comment) (Wound leg)  Last BM:  5/16  Height:   Ht Readings from Last 1 Encounters:  12/23/16 '5\' 10"'$  (1.778 m)    Weight:   Wt Readings from Last 1 Encounters:  12/24/16 235 lb 11.2 oz (106.9 kg)    Ideal Body Weight:  75.4 kg  BMI:  Body mass index is 33.82 kg/m.  Estimated Nutritional Needs:   Kcal:  1900-2200kcal/day   Protein:  107-128g/day   Fluid:   >1.9L/day   EDUCATION NEEDS:   No education needs identified at this time  Koleen Distance MS, RD, LDN Pager #- [phone]

## 2016-12-24 NOTE — Clinical Social Work Note (Addendum)
Clinical Social Work Assessment  Patient Details  Name: [name] MRN: [id number] Date of Birth: 1941-04-05  Date of referral:  12/24/16               Reason for consult:  Facility Placement, Discharge Planning                Permission sought to share information with:  Facility Art therapist granted to share information::  Yes, Verbal Permission Granted  Name::     [name]  Agency::  [facility]  Relationship::  Scientific laboratory technician Information:  [phone]  Housing/Transportation Living arrangements for the past 2 months:  [place] of Information:  Patient Patient Interpreter Needed:  None Criminal Activity/Legal Involvement Pertinent to Current Situation/Hospitalization:  No - Comment as needed Significant Relationships:  Other(Comment) Lives with:  Facility Resident Do you feel safe going back to the place where you live?  Yes Need for family participation in patient care:  No (Coment)  Care giving concerns:  No care giving concerns identified.   Social Worker assessment / plan:  CSW met with pt to address consult for pt as he was admitted from [facility]. CSW introduced herself and explained role of social work. CSW also explained the process of returning to ALF. Pt stated that he has been there for 5 years and would like to return. Pt reported that he does not have any family to call, however [place] with [facility] is only person on the contact sheet. CSW left a message with Elvis Coil requesting a return phone call. CSW will continue to follow.   Employment status:  Retired Forensic scientist:  Medicare PT Recommendations:  Not assessed at this time [place] / Referral to community resources:  Other (Comment Required) [facility])  Patient/Family's Response to care:  Pt was appreciative of CSW support.   Patient/Family's Understanding of and Emotional Response to Diagnosis, Current Treatment, and  Prognosis:  Pt would like to return home and as of now, that is the plan.   Emotional Assessment Appearance:  Appears stated age Attitude/Demeanor/Rapport:  Other (Appropriate) Affect (typically observed):  Accepting, Adaptable, Pleasant Orientation:  Oriented to Situation, Oriented to  Time, Oriented to Place, Oriented to Self Alcohol / Substance use:  Not Applicable Psych involvement (Current and /or in the community):  No (Comment)  Discharge Needs  Concerns to be addressed:  Adjustment to Illness Readmission within the last 30 days:  No Current discharge risk:  Chronically ill Barriers to Discharge:  Continued Medical Work up   Darden Dates, LCSW 12/24/2016, 11:40 AM  ADDENDUM: CSW spoke with [place] at [facility] and pt is able to return. Per faciliyt, pt will need EMS transportation at discharge. CSW will continue to follow.  Darden Dates, MSW, LCSW  Clinical Social Worker [phone]

## 2016-12-24 NOTE — Care Management Important Message (Signed)
Important Message  Patient Details  Name: Cephas DarbyJerry L Campos MRN: [id number] Date of Birth: 04-13-1941   Medicare Important Message Given:  Yes    Marily MemosLisa M Kanesha Cadle, RN 12/24/2016, 3:12 PM

## 2016-12-24 NOTE — Plan of Care (Signed)
Problem: Health Behavior/Discharge Planning: Goal: Ability to manage health-related needs will improve Outcome: Progressing Pt has been upset this pm.  Pt wanted tray removed  And it was 15 min before  Anyone came in to move it. Tried to calm pt.   Problem: Skin Integrity: Goal: Risk for impaired skin integrity will decrease Outcome: Progressing Pt has  Compression wrap on rt  Lower leg. Wound consult. Clydie BraunKaren from [facility] called and said she would not  Be able to come today  And to tell pt  She would see him Monday if he was still here, if not  For pt to call [facility] on Monday to followup.  Clydie BraunKaren said to not change wrap.  Problem: Fluid Volume: Goal: Ability to maintain a balanced intake and output will improve Outcome: Progressing ivfs d/cd. Pt received  Bolus doses of k and 1 dose of mag.iv.

## 2016-12-25 ENCOUNTER — Inpatient Hospital Stay: Payer: Medicare Other

## 2016-12-25 LAB — BASIC METABOLIC PANEL
Anion gap: 4 — ABNORMAL LOW (ref 5–15)
BUN: 14 mg/dL (ref 6–20)
CALCIUM: 8 mg/dL — AB (ref 8.9–10.3)
CHLORIDE: 112 mmol/L — AB (ref 101–111)
CO2: 26 mmol/L (ref 22–32)
CREATININE: 1.19 mg/dL (ref 0.61–1.24)
GFR, EST NON AFRICAN AMERICAN: 59 mL/min — AB (ref >60)
Glucose, Bld: 91 mg/dL (ref 65–99)
Potassium: 3.4 mmol/L — ABNORMAL LOW (ref 3.5–5.1)
SODIUM: 142 mmol/L (ref 135–145)

## 2016-12-25 LAB — CBC
HCT: 25.1 % — ABNORMAL LOW (ref 40.0–52.0)
Hemoglobin: 8.4 g/dL — ABNORMAL LOW (ref 13.0–18.0)
MCH: 28 pg (ref 26.0–34.0)
MCHC: 33.4 g/dL (ref 32.0–36.0)
MCV: 83.8 fL (ref 80.0–100.0)
PLATELETS: 209 10*3/uL (ref 150–440)
RBC: 3 MIL/uL — ABNORMAL LOW (ref 4.40–5.90)
RDW: 16.4 % — AB (ref 11.5–14.5)
WBC: 4.4 10*3/uL (ref 3.8–10.6)

## 2016-12-25 LAB — URINE CULTURE: Special Requests: NORMAL

## 2016-12-25 LAB — MAGNESIUM: MAGNESIUM: 2 mg/dL (ref 1.7–2.4)

## 2016-12-25 LAB — FERRITIN: FERRITIN: 38 ng/mL (ref 24–336)

## 2016-12-25 MED ORDER — CIPROFLOXACIN HCL 500 MG PO TABS: 500.0000 mg | ORAL_TABLET | Freq: Two times a day (BID) | ORAL | 0 refills | Status: DC

## 2016-12-25 MED ORDER — ENSURE ENLIVE PO LIQD: 237.0000 mL | Freq: Two times a day (BID) | ORAL | 12 refills | Status: DC

## 2016-12-25 NOTE — Progress Notes (Signed)
PT Cancellation Note  Patient Details Name: [name] MRN: [id number] DOB: 02-28-1941   Cancelled Treatment:    Reason Eval/Treat Not Completed: Patient at procedure or test/unavailable.  Try later when he returns from getting imaging done.   Ivar DrapeRuth E Kieley Akter 12/25/2016, 10:30 AM   Samul Dadauth Melida Northington, PT MS Acute Rehab Dept. Number: [name] [phone] and [facility] [phone]

## 2016-12-25 NOTE — Progress Notes (Signed)
Pt. VSS, resting in room.  No care concerns at this time. Report given to Imma, RN- handing over care at this time.

## 2016-12-25 NOTE — [place] FL2 (Signed)
[place] MEDICAID FL2 LEVEL OF CARE SCREENING TOOL     IDENTIFICATION  Patient Name: [name] Birthdate: 02-05-1941 Sex: male Admission Date (Current Location): 12/23/2016  [facility] and [place] Number:  Chiropodist and Address:  [facility], [address] [place], [place] [address]      Provider Number: [id number]  Attending Physician Name and Address:  Enid Baas, MD  Relative Name and Phone Number:       Current Level of Care: Hospital Recommended Level of Care: Family Care Home/Assisted Living Facility Prior Approval Number:    Date Approved/Denied:   PASRR Number:    Discharge Plan: Domiciliary (Rest home)    Current Diagnoses: Patient Active Problem List   Diagnosis Date Noted  . Proctocolitis 12/23/2016  . Acute posthemorrhagic anemia 11/25/2016  . Hematuria 11/25/2016  . Guaiac positive stools 11/25/2016  . Acute renal insufficiency 11/25/2016  . Pressure injury of skin 09/23/2016  . Back pain 05/28/2016  . Sepsis (HCC) 05/27/2016  . UTI (urinary tract infection) 05/27/2016  . Escherichia coli (E. coli) infection 05/27/2016  . DVT of lower extremity, bilateral (HCC) 05/27/2016  . 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 BPH (benign prostatic hyperplasia) 05/27/2016  . Acute on chronic renal failure (HCC) 05/27/2016  . Bipolar disorder (HCC) 05/27/2016  . Generalized weakness 05/27/2016  . 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 C. difficile colitis 05/27/2016  . Acute kidney injury (HCC) 05/21/2016  . Acute lower UTI 04/23/2016  . 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 Pneumonia 02/21/2015  . ARF (acute renal failure) (HCC) 02/21/2015  . Hypokalemia 02/21/2015  . Leukocytosis 02/21/2015  . Venous insufficiency of both lower extremities 12/04/2014  . Benign essential hypertension 11/25/2014  . Bilateral carotid artery stenosis 11/15/2014  . Bilateral  leg edema 11/15/2014  . COPD (chronic obstructive pulmonary disease) (HCC) 11/15/2014  . Gastroesophageal reflux disease 11/15/2014  . Mild aortic valve stenosis 11/15/2014  . Mixed hyperlipidemia 11/15/2014  . Obesity 11/15/2014  . Seizure disorder (HCC) 11/15/2014    Orientation RESPIRATION BLADDER Height & Weight     Self, Time, Situation  Normal Continent Weight: 235 lb 11.2 oz (106.9 kg) (bed weight) Height:  5\' 10"  (177.8 cm)  BEHAVIORAL SYMPTOMS/MOOD NEUROLOGICAL BOWEL NUTRITION STATUS      Continent    AMBULATORY STATUS COMMUNICATION OF NEEDS Skin   Total Care Verbally Normal                       Personal Care Assistance Level of Assistance  Bathing, Feeding, Dressing Bathing Assistance: Needs assistance Feeding assistance: Independent Dressing assistance: Needs Assistance     Functional Limitations Info             SPECIAL CARE FACTORS FREQUENCY                       Contractures Contractures Info: Present    Additional Factors Info  Allergies   Allergies Info: Penicillin G           Current Medications (12/25/2016):  This is the current hospital active medication list Current Facility-Administered Medications  Medication Dose Route Frequency Provider Last Rate Last Dose  . acetaminophen (TYLENOL) tablet 650 mg  650 mg Oral Q6H PRN Altamese Dilling, MD      . albuterol (PROVENTIL) (2.5 MG/3ML) 0.083% nebulizer solution 2.5 mg  2.5 mg Inhalation Q6H PRN Altamese Dilling, MD      .  amLODipine (NORVASC) tablet 2.5 mg  2.5 mg Oral Daily Altamese DillingVachhani, Vaibhavkumar, MD   2.5 mg at 12/25/16 1016  . buPROPion (WELLBUTRIN XL) 24 hr tablet 300 mg  300 mg Oral Daily Alford HighlandWieting, Richard, MD   300 mg at 12/25/16 1016  . carvedilol (COREG) tablet 25 mg  25 mg Oral BID Altamese DillingVachhani, Vaibhavkumar, MD   25 mg at 12/25/16 1016  . cholecalciferol (VITAMIN D) tablet 1,000 Units  1,000 Units Oral Daily Altamese DillingVachhani, Vaibhavkumar, MD   1,000 Units at 12/25/16 1016   . ciprofloxacin (CIPRO) tablet 500 mg  500 mg Oral BID Alford HighlandWieting, Richard, MD   500 mg at 12/25/16 1016  . docusate sodium (COLACE) capsule 100 mg  100 mg Oral BID PRN Altamese DillingVachhani, Vaibhavkumar, MD   100 mg at 12/24/16 1525  . feeding supplement (ENSURE ENLIVE) (ENSURE ENLIVE) liquid 237 mL  237 mL Oral BID BM Alford HighlandWieting, Richard, MD   237 mL at 12/25/16 1021  . finasteride (PROSCAR) tablet 5 mg  5 mg Oral Daily Altamese DillingVachhani, Vaibhavkumar, MD   5 mg at 12/25/16 1016  . heparin injection 5,000 Units  5,000 Units Subcutaneous Q8H Altamese DillingVachhani, Vaibhavkumar, MD   5,000 Units at 12/25/16 0604  . loperamide (IMODIUM) capsule 2-4 mg  2-4 mg Oral PRN Altamese DillingVachhani, Vaibhavkumar, MD      . multivitamin with minerals tablet 1 tablet  1 tablet Oral Daily Alford HighlandWieting, Richard, MD   1 tablet at 12/25/16 1016  . polyethylene glycol (MIRALAX / GLYCOLAX) packet 17 g  17 g Oral Daily PRN Altamese DillingVachhani, Vaibhavkumar, MD      . potassium chloride (K-DUR,KLOR-CON) CR tablet 30 mEq  30 mEq Oral BID Altamese DillingVachhani, Vaibhavkumar, MD   30 mEq at 12/25/16 1016  . pravastatin (PRAVACHOL) tablet 40 mg  40 mg Oral Daily Altamese DillingVachhani, Vaibhavkumar, MD   40 mg at 12/25/16 1016  . tamsulosin (FLOMAX) capsule 0.4 mg  0.4 mg Oral Daily Altamese DillingVachhani, Vaibhavkumar, MD   0.4 mg at 12/25/16 1016  . traMADol (ULTRAM) tablet 50 mg  50 mg Oral Q6H PRN Altamese DillingVachhani, Vaibhavkumar, MD         Discharge Medications: DISCHARGE MEDICATIONS:       Allergies as of 12/25/2016      Reactions   Penicillin G Other (See Comments)   Convulsions. Has patient had a PCN reaction causing immediate rash, facial/tongue/throat swelling, SOB or lightheadedness with hypotension: not sure Has patient had a PCN reaction causing severe rash involving mucus membranes or skin necrosis: not sure Has patient had a PCN reaction that required hospitalization: not sure Has patient had a PCN reaction occurring within the last 10 years: not sure If all of the above answers are "NO", then may proceed with  Cephalosporin use.         Medication List    STOP taking these medications   apixaban 5 MG Tabs tablet Commonly known as:  ELIQUIS   ARIPiprazole 20 MG tablet Commonly known as:  ABILIFY   furosemide 20 MG tablet Commonly known as:  LASIX     TAKE these medications   acetaminophen 325 MG tablet Commonly known as:  TYLENOL Take 2 tablets (650 mg total) by mouth every 6 (six) hours as needed.   amLODipine 2.5 MG tablet Commonly known as:  NORVASC Take 1 tablet (2.5 mg total) by mouth daily.   buPROPion 300 MG 24 hr tablet Commonly known as:  BUDEPRION XL Take 1 tablet (300 mg total) by mouth daily.   carvedilol 25 MG  tablet Commonly known as:  COREG Take 1 tablet (25 mg total) by mouth 2 (two) times daily.   cholecalciferol 1000 units tablet Commonly known as:  VITAMIN D Take 1,000 Units by mouth daily.   ciprofloxacin 500 MG tablet Commonly known as:  CIPRO Take 1 tablet (500 mg total) by mouth 2 (two) times daily. X 6 more days   feeding supplement (ENSURE ENLIVE) Liqd Take 237 mLs by mouth 2 (two) times daily between meals.   finasteride 5 MG tablet Commonly known as:  PROSCAR Take 1 tablet (5 mg total) by mouth daily.   loperamide 2 MG tablet Commonly known as:  IMODIUM A-D Take 2-4 mg by mouth as needed for diarrhea or loose stools. 4 mg at first episode of diarrhea, then 2 mg for each subsequent episode as needed   omeprazole 20 MG capsule Commonly known as:  PRILOSEC Take 2 capsules (40 mg total) by mouth daily. What changed:  when to take this   PHENobarbital 64.8 MG tablet Commonly known as:  LUMINAL Take 2 tablets (129.6 mg total) by mouth every other day.   polyethylene glycol packet Commonly known as:  MIRALAX / GLYCOLAX Take 17 g by mouth daily as needed for moderate constipation.   potassium chloride SA 20 MEQ tablet Commonly known as:  K-DUR,KLOR-CON Take 30 mEq by mouth 2 (two) times daily.   pravastatin 40 MG  tablet Commonly known as:  PRAVACHOL Take 1 tablet (40 mg total) by mouth daily.   tamsulosin 0.4 MG Caps capsule Commonly known as:  FLOMAX Take 1 capsule (0.4 mg total) by mouth daily.   traMADol 50 MG tablet Commonly known as:  ULTRAM Take 1 tablet (50 mg total) by mouth every 6 (six) hours as needed for moderate pain or severe pain.   VENTOLIN HFA 108 (90 Base) MCG/ACT inhaler Generic drug:  albuterol Inhale 2 puffs into the lungs every 6 (six) hours as needed for shortness of breath.       Relevant Imaging Results:  Relevant Lab Results:   Additional Information    [name], LCSW

## 2016-12-25 NOTE — Progress Notes (Signed)
[facility] [place] [facility]   PATIENT NAME: [name]    MR#:  [id number]  DATE OF BIRTH:  [birth year]  SUBJECTIVE:  CHIEF COMPLAINT:   Chief Complaint  Patient presents with  . Loss of Consciousness  . Abdominal Pain   - Patient from a group home admitted after syncope/fall. -Asymptomatic at this time. CT of the abdomen with proctocolitis and currently on antibiotics.  REVIEW OF SYSTEMS:  Review of Systems  Constitutional: Positive for malaise/fatigue. Negative for chills and fever.  HENT: Negative for congestion, ear discharge, hearing loss and nosebleeds.   Eyes: Negative for blurred vision and double vision.  Respiratory: Positive for cough. Negative for shortness of breath and wheezing.   Cardiovascular: Negative for chest pain and palpitations.  Gastrointestinal: Negative for abdominal pain, constipation, diarrhea, nausea and vomiting.  Genitourinary: Negative for dysuria.  Musculoskeletal: Negative for myalgias.  Neurological: Negative for dizziness, seizures and headaches.    DRUG ALLERGIES:   Allergies  Allergen Reactions  . Penicillin G Other (See Comments)    Convulsions. Has patient had a PCN reaction causing immediate rash, facial/tongue/throat swelling, SOB or lightheadedness with hypotension: not sure Has patient had a PCN reaction causing severe rash involving mucus membranes or skin necrosis: not sure Has patient had a PCN reaction that required hospitalization: not sure Has patient had a PCN reaction occurring within the last 10 years: not sure If all of the above answers are "NO", then may proceed with Cephalosporin use.    VITALS:  Blood pressure 137/68, pulse 79, temperature 97.6 F (36.4 C), temperature source Oral, resp. rate 19, height 5\' 10"  (1.778 m), weight 106.9 kg (235 lb 11.2 oz), SpO2 98 %.  PHYSICAL EXAMINATION:  Physical Exam  GENERAL:  76 y.o.-year-old patient lying in the bed with no acute distress.    EYES: Pupils equal, round, reactive to light and accommodation. No scleral icterus. Extraocular muscles intact.  HEENT: Head atraumatic, normocephalic. Oropharynx and nasopharynx clear.  NECK:  Supple, no jugular venous distention. No thyroid enlargement, no tenderness.  LUNGS: Normal breath sounds bilaterally however sounds congested in the upper airways, no wheezing, rales,rhonchi or crepitation. No use of accessory muscles of respiration.  CARDIOVASCULAR: S1, S2 normal. No murmurs, rubs, or gallops.  ABDOMEN: Soft, nontender, nondistended. Bowel sounds present. No organomegaly or mass.  EXTREMITIES: No pedal edema, cyanosis, or clubbing.  NEUROLOGIC: Cranial nerves II through XII are intact. Muscle strength 5/5 in all extremities. Following simple commands and global weakness noted. Sensation intact. Gait not checked.  PSYCHIATRIC: The patient is alert and oriented x2  SKIN: No obvious rash, lesion, or ulcer.    LABORATORY PANEL:   CBC  Recent Labs Lab 12/25/16 0559  WBC 4.4  HGB 8.4*  HCT 25.1*  PLT 209   ------------------------------------------------------------------------------------------------------------------  Chemistries   Recent Labs Lab 12/23/16 1447  12/25/16 0559  NA 141  < > 142  K 3.3*  < > 3.4*  CL 111  < > 112*  CO2 25  < > 26  GLUCOSE 109*  < > 91  BUN 14  < > 14  CREATININE 1.47*  < > 1.19  CALCIUM 8.5*  < > 8.0*  MG  --   --  2.0  AST 13*  --   --   ALT 7*  --   --   ALKPHOS 76  --   --   BILITOT 0.4  --   --   < > =  values in this interval not displayed. ------------------------------------------------------------------------------------------------------------------  Cardiac Enzymes  Recent Labs Lab 12/23/16 1447  TROPONINI <0.03   ------------------------------------------------------------------------------------------------------------------  RADIOLOGY:  Ct Abdomen Pelvis W Contrast  Result Date: 12/23/2016 CLINICAL DATA:   Mid abdominal pain and cramping.  Syncope. EXAM: CT ABDOMEN AND PELVIS WITH CONTRAST TECHNIQUE: Multidetector CT imaging of the abdomen and pelvis was performed using the standard protocol following bolus administration of intravenous contrast. CONTRAST:  75 mL Isovue-300 COMPARISON:  Right upper quadrant abdominal ultrasound 05/26/2016. CT abdomen and pelvis 02/21/2015. FINDINGS: Lower chest: Trace right and small left pleural effusions. Minimal bibasilar atelectasis. Normal heart size. Hepatobiliary: No focal liver abnormality is seen. The gallbladder is grossly unremarkable, with assessment limited by underdistention and mild motion artifact. No significant biliary dilatation is seen. Pancreas: Mild diffuse atrophy without ductal dilatation. Mild stranding inferior to the pancreatic tail is not felt to be pancreatic in origin. Spleen: Unremarkable. Adrenals/Urinary Tract: Unremarkable adrenal glands. Nonobstructing right renal calculi measuring up to 1.3 cm in size, overall slightly increased from the prior CT. 5.1 cm right upper pole renal cyst has mildly enlarged as have smaller cysts in the right lower pole measuring up to 2 cm in size. Small cysts in the left kidney measure up to 2.2 cm. No hydronephrosis. Poor intravascular enhancement on the portal venous phase images with no significant contrast excretion on delayed images, possibly reflecting an issue with the contrast injection. Foley catheter and small amount of gas in the bladder. Stomach/Bowel: The stomach is within normal limits. Oral contrast is present in multiple nondilated small bowel loops without evidence of obstruction. There is a moderate amount of stool in the ascending and transverse colon. The rectum and distal sigmoid colon are mildly distended by fluid with mild diffuse wall thickening and moderate surrounding inflammatory change. The more proximal sigmoid colon is very redundant and mildly to moderately distended by gas. There is mild  inflammatory stranding which extends more superiorly throughout the mesenteric fat, more notable on the left with a trace amount of fluid at the level of the splenic flexure. The appendix is unremarkable. Vascular/Lymphatic: Moderate aortoiliac atherosclerosis without aneurysm. No enlarged lymph nodes. Reproductive:  Unremarkable prostate. Other: Moderate to prominent diffuse body wall edema. Musculoskeletal: Thoracolumbar spondylosis. Moderate to advanced lumbar facet arthrosis. IMPRESSION: 1. Wall thickening and surrounding inflammation involving the distal sigmoid colon and rectum compatible with proctocolitis. 2. Trace pleural effusions.  Diffuse body wall edema. 3. Nonobstructing right nephrolithiasis. 4.  Aortic Atherosclerosis (ICD10-I70.0). Electronically Signed   By: Sebastian AcheAllen  Grady M.D.   On: 12/23/2016 16:16    EKG:   Orders placed or performed during the hospital encounter of 12/23/16  . ED EKG  . ED EKG  . EKG 12-Lead  . EKG 12-Lead    ASSESSMENT AND PLAN:   76 year old male with past medical history significant for bipolar disorder, schizophrenia, DVT, congestive heart failure, COPD, hypertension, seizure disorder, history of urinary retention with chronic Foley catheter presents to hospital secondary to abdominal pain and noted to have proctocolitis.  #1 weakness and abdominal pain-CT of the abdomen confirming proctocolitis. -Continue ciprofloxacin for 6 more days. -Abdominal symptoms have resolved at this time.  #2 congested cough-known history of CHF- -Check chest x-ray. If needed swallow evaluation. Discontinue IV fluids. Monitor. Not requiring oxygen.  #3 history of DVT from October 2017-currently off of anticoagulation. Follow-up with Doppler's of lower extremities as outpatient. Not on maintenance medication due to history of anemia  #4 lower extremity edema with right  lower extremity chronic wounds-continue current dressing. Has compression dressing to the right leg.  Outpatient follow-up with wound care center recommended in 2-3 days  #5 bipolar disorder/schizophrenia-continue outpatient medications. Stable at this time. Patient is from group home  #6 BPH and chronic urinary retention-has chronic Foley catheter. Continue Flomax and finasteride. Foley changed on this admission in the emergency room.  #7 DVT prophylaxis-currently on subcutaneous heparin  Physical therapy consulted. Likely wheelchair bound at baseline.   All the records are reviewed and case discussed with Care Management/Social Workerr. Management plans discussed with the patient, family and they are in agreement.  CODE STATUS: Full code  TOTAL TIME TAKING CARE OF THIS PATIENT:39 minutes.   POSSIBLE D/C today or tomorrow, DEPENDING ON CLINICAL CONDITION.   Enid Baas M.D on 12/25/2016 at 8:57 AM  Between 7am to 6pm - Pager - [phone]  After 6pm go to [email or web address] - password [email or web address]  [facility]  Office  [phone]  CC: Primary care physician; Angela Cox, MD

## 2016-12-25 NOTE — Care Management Note (Signed)
Case Management Note  Patient Details  Name: [name] DarbyJerry L Civil MRN: [id number] Date of Birth: 11/19/1940  Subjective/Objective:         Discharged to home. No home health orders at this time.           Action/Plan:   Expected Discharge Date:  12/25/16               Expected Discharge Plan:     In-House Referral:     Discharge planning Services     Post Acute Care Choice:    Choice offered to:     DME Arranged:    DME Agency:     HH Arranged:    HH Agency:     Status of Service:     If discussed at [employer] of [employer] Meetings, dates discussed:    Additional Comments:  Lamone Ferrelli A, RN 12/25/2016, 12:47 PM

## 2016-12-25 NOTE — Care Management Note (Addendum)
Case Management Note  Patient Details  Name: Cephas DarbyJerry L Mineau MRN: [id number] Date of Birth: 10-23-1940  Subjective/Objective:      Mr [name] is being discharged back to [facility] ph: [phone] at [address], [place], [place] [address]. Mr [name] is already being followed for home health RN by [facility]. [facility] for [facility] of care home health RN and to add PT services..               Action/Plan:   Expected Discharge Date:  12/25/16               Expected Discharge Plan:   12/25/16  In-House Referral:     Discharge planning Services     Post Acute Care Choice:   [facility] Choice offered to:    Pt DME Arranged:   NA DME Agency:   NA  HH Arranged:   HH-PT HH Agency:   [facility]  Status of Service:   Completed  If discussed at Long Length of Stay Meetings, dates discussed:    Additional Comments:  Cesiah Westley A, RN 12/25/2016, 1:26 PM

## 2016-12-25 NOTE — Clinical Social Work Note (Signed)
The [facility] will [facility] via non-emergent EMS. CSW will deliver packet once the [facility] is signed by the attending MD. The facility is aware and in agreement. The CSW will continue to follow for discharge facilitation.  Argentina PonderKaren Martha Bethel Gaglio, MSW, Theresia MajorsLCSWA [phone]

## 2016-12-25 NOTE — Discharge Summary (Signed)
[facility] [place] [facility]   PATIENT NAME: [name]    MR#:  [id number]  DATE OF BIRTH:  September 19, 1940  DATE OF ADMISSION:  12/23/2016   ADMITTING PHYSICIAN: Altamese Dilling, MD  DATE OF DISCHARGE: 12/25/16  PRIMARY CARE PHYSICIAN: Angela Cox, MD   ADMISSION DIAGNOSIS:   Proctocolitis [K52.9] Generalized abdominal pain [R10.84] Urinary tract infection without hematuria, site unspecified [N39.0] Syncope, unspecified syncope type [R55]  DISCHARGE DIAGNOSIS:   Principal Problem:   UTI (urinary tract infection) Active Problems:   Proctocolitis   SECONDARY DIAGNOSIS:   Past Medical History:  Diagnosis Date  . Bipolar 1 disorder (HCC)   . CHF (congestive heart failure) (HCC)   . COPD (chronic obstructive pulmonary disease) (HCC)   . DVT (deep venous thrombosis) (HCC)   . GERD (gastroesophageal reflux disease)   . High cholesterol   . Hypertension   . Schizophrenia (HCC)   . Seizure [facility])     HOSPITAL COURSE:   76 year old male with past medical history significant for bipolar disorder, schizophrenia, DVT, congestive heart failure, COPD, hypertension, seizure disorder, history of urinary retention with chronic Foley catheter presents to hospital secondary to abdominal pain and noted to have proctocolitis.  #1 weakness and abdominal pain-CT of the abdomen confirming proctocolitis. -Continue ciprofloxacin for 6 more days. -Abdominal symptoms have resolved at this time.  #2 congested cough-known history of CHF- -Check chest x-ray. If needed swallow evaluation. Discontinue IV fluids. Monitor. Not requiring oxygen.  #3 history of DVT from October 2017-currently off of anticoagulation. Follow-up with Doppler's of lower extremities as outpatient. Not on maintenance medication due to history of anemia  #4 lower extremity edema with right lower extremity chronic wounds-continue current dressing. Has compression dressing to  the right leg. Outpatient follow-up with wound care center recommended in 2-3 days  #5 bipolar disorder/schizophrenia-continue outpatient medications. Stable at this time. Patient is from group home  #6 BPH and chronic urinary retention-has chronic Foley catheter. Continue Flomax and finasteride. Foley changed on this admission in the emergency room.  #7 DVT prophylaxis-currently on subcutaneous heparin  Physical therapy consulted. Likely wheelchair bound at baseline.  DISCHARGE CONDITIONS:   Guarded  CONSULTS OBTAINED:   None  DRUG ALLERGIES:   Allergies  Allergen Reactions  . Penicillin G Other (See Comments)    Convulsions. Has patient had a PCN reaction causing immediate rash, facial/tongue/throat swelling, SOB or lightheadedness with hypotension: not sure Has patient had a PCN reaction causing severe rash involving mucus membranes or skin necrosis: not sure Has patient had a PCN reaction that required hospitalization: not sure Has patient had a PCN reaction occurring within the last 10 years: not sure If all of the above answers are "NO", then may proceed with Cephalosporin use.   DISCHARGE MEDICATIONS:   Allergies as of 12/25/2016      Reactions   Penicillin G Other (See Comments)   Convulsions. Has patient had a PCN reaction causing immediate rash, facial/tongue/throat swelling, SOB or lightheadedness with hypotension: not sure Has patient had a PCN reaction causing severe rash involving mucus membranes or skin necrosis: not sure Has patient had a PCN reaction that required hospitalization: not sure Has patient had a PCN reaction occurring within the last 10 years: not sure If all of the above answers are "NO", then may proceed with Cephalosporin use.      Medication List    STOP taking these medications   apixaban 5 MG Tabs tablet Commonly known as:  ELIQUIS   ARIPiprazole 20 MG tablet Commonly known as:  ABILIFY   furosemide 20 MG tablet Commonly known  as:  LASIX     TAKE these medications   acetaminophen 325 MG tablet Commonly known as:  TYLENOL Take 2 tablets (650 mg total) by mouth every 6 (six) hours as needed.   amLODipine 2.5 MG tablet Commonly known as:  NORVASC Take 1 tablet (2.5 mg total) by mouth daily.   buPROPion 300 MG 24 hr tablet Commonly known as:  BUDEPRION XL Take 1 tablet (300 mg total) by mouth daily.   carvedilol 25 MG tablet Commonly known as:  COREG Take 1 tablet (25 mg total) by mouth 2 (two) times daily.   cholecalciferol 1000 units tablet Commonly known as:  VITAMIN D Take 1,000 Units by mouth daily.   ciprofloxacin 500 MG tablet Commonly known as:  CIPRO Take 1 tablet (500 mg total) by mouth 2 (two) times daily. X 6 more days   feeding supplement (ENSURE ENLIVE) Liqd Take 237 mLs by mouth 2 (two) times daily between meals.   finasteride 5 MG tablet Commonly known as:  PROSCAR Take 1 tablet (5 mg total) by mouth daily.   loperamide 2 MG tablet Commonly known as:  IMODIUM A-D Take 2-4 mg by mouth as needed for diarrhea or loose stools. 4 mg at first episode of diarrhea, then 2 mg for each subsequent episode as needed   omeprazole 20 MG capsule Commonly known as:  PRILOSEC Take 2 capsules (40 mg total) by mouth daily. What changed:  when to take this   PHENobarbital 64.8 MG tablet Commonly known as:  LUMINAL Take 2 tablets (129.6 mg total) by mouth every other day.   polyethylene glycol packet Commonly known as:  MIRALAX / GLYCOLAX Take 17 g by mouth daily as needed for moderate constipation.   potassium chloride SA 20 MEQ tablet Commonly known as:  K-DUR,KLOR-CON Take 30 mEq by mouth 2 (two) times daily.   pravastatin 40 MG tablet Commonly known as:  PRAVACHOL Take 1 tablet (40 mg total) by mouth daily.   tamsulosin 0.4 MG Caps capsule Commonly known as:  FLOMAX Take 1 capsule (0.4 mg total) by mouth daily.   traMADol 50 MG tablet Commonly known as:  ULTRAM Take 1 tablet (50  mg total) by mouth every 6 (six) hours as needed for moderate pain or severe pain.   VENTOLIN HFA 108 (90 Base) MCG/ACT inhaler Generic drug:  albuterol Inhale 2 puffs into the lungs every 6 (six) hours as needed for shortness of breath.        DISCHARGE INSTRUCTIONS:   1. PCP f/u in 1-2 weeks  DIET:   Cardiac diet  ACTIVITY:   Activity as tolerated  OXYGEN:   Home Oxygen: No.  Oxygen Delivery: room air  DISCHARGE LOCATION:   group home   If you experience worsening of your admission symptoms, develop shortness of breath, life threatening emergency, suicidal or homicidal thoughts you must seek medical attention immediately by calling 911 or calling your MD immediately  if symptoms less severe.  You Must read complete instructions/literature along with all the possible adverse reactions/side effects for all the Medicines you take and that have been prescribed to you. Take any new Medicines after you have completely understood and accpet all the possible adverse reactions/side effects.   Please note  You were cared for by a hospitalist during your hospital stay. If you have any questions about your discharge medications or  the care you received while you were in the hospital after you are discharged, you can call the unit and asked to speak with the hospitalist on call if the hospitalist that took care of you is not available. Once you are discharged, your primary care physician will handle any further medical issues. Please note that NO REFILLS for any discharge medications will be authorized once you are discharged, as it is imperative that you return to your primary care physician (or establish a relationship with a primary care physician if you do not have one) for your aftercare needs so that they can reassess your need for medications and monitor your lab values.    On the day of Discharge:  VITAL SIGNS:   Blood pressure 137/68, pulse 79, temperature 97.6 F (36.4 C),  temperature source Oral, resp. rate 19, height 5\' 10"  (1.778 m), weight 106.9 kg (235 lb 11.2 oz), SpO2 98 %.  PHYSICAL EXAMINATION:    GENERAL:  76 y.o.-year-old patient lying in the bed with no acute distress.  EYES: Pupils equal, round, reactive to light and accommodation. No scleral icterus. Extraocular muscles intact.  HEENT: Head atraumatic, normocephalic. Oropharynx and nasopharynx clear.  NECK:  Supple, no jugular venous distention. No thyroid enlargement, no tenderness.  LUNGS: Normal breath sounds bilaterally however sounds congested in the upper airways, no wheezing, rales,rhonchi or crepitation. No use of accessory muscles of respiration.  CARDIOVASCULAR: S1, S2 normal. No murmurs, rubs, or gallops.  ABDOMEN: Soft, nontender, nondistended. Bowel sounds present. No organomegaly or mass.  EXTREMITIES: No pedal edema, cyanosis, or clubbing.  NEUROLOGIC: Cranial nerves II through XII are intact. Muscle strength 5/5 in all extremities. Following simple commands and global weakness noted. Sensation intact. Gait not checked.  PSYCHIATRIC: The patient is alert and oriented x2  SKIN: No obvious rash, lesion, or ulcer.   DATA REVIEW:   CBC  Recent Labs Lab 12/25/16 0559  WBC 4.4  HGB 8.4*  HCT 25.1*  PLT 209    Chemistries   Recent Labs Lab 12/23/16 1447  12/25/16 0559  NA 141  < > 142  K 3.3*  < > 3.4*  CL 111  < > 112*  CO2 25  < > 26  GLUCOSE 109*  < > 91  BUN 14  < > 14  CREATININE 1.47*  < > 1.19  CALCIUM 8.5*  < > 8.0*  MG  --   --  2.0  AST 13*  --   --   ALT 7*  --   --   ALKPHOS 76  --   --   BILITOT 0.4  --   --   < > = values in this interval not displayed.   Microbiology Results  Results for orders placed or performed during the hospital encounter of 12/23/16  Urine culture     Status: Abnormal   Collection Time: 12/23/16  4:53 PM  Result Value Ref Range Status   Specimen Description URINE, CATHETERIZED  Final   Special Requests Normal  Final    Culture MULTIPLE SPECIES PRESENT, SUGGEST RECOLLECTION (A)  Final   Report Status 12/25/2016 FINAL  Final  MRSA PCR Screening     Status: None   Collection Time: 12/24/16  2:09 PM  Result Value Ref Range Status   MRSA by PCR NEGATIVE NEGATIVE Final    Comment:        The GeneXpert MRSA Assay (FDA approved for NASAL specimens only), is one component of a comprehensive MRSA  colonization surveillance program. It is not intended to diagnose MRSA infection nor to guide or monitor treatment for MRSA infections.     RADIOLOGY:  Dg Chest 2 View  Result Date: 12/25/2016 CLINICAL DATA:  Cough and fatigue EXAM: CHEST  2 VIEW COMPARISON:  09/22/2016 FINDINGS: The heart is upper normal in size. Low lung volumes. Bibasilar atelectasis. Small pleural effusions. Normal vascularity. No pneumothorax. IMPRESSION: Bibasilar atelectasis and small pleural effusions. Electronically Signed   By: Jolaine Click M.D.   On: 12/25/2016 10:11     Management plans discussed with the patient, family and they are in agreement.  CODE STATUS:     Code Status Orders        Start     Ordered   12/23/16 2151  Full code  Continuous     12/23/16 2151    Code Status History    Date Active Date Inactive Code Status Order ID Comments User Context   09/22/2016 12:23 PM 09/24/2016  7:53 PM Full Code [id number]  Alford Highland, MD ED   05/24/2016  2:53 PM 05/28/2016  6:19 PM DNR [id number]  Alford Highland, MD Inpatient   05/21/2016  7:36 PM 05/24/2016  2:53 PM Full Code [id number]  Alford Highland, MD ED   04/23/2016  4:22 PM 04/24/2016 12:58 PM Full Code [id number]  Auburn Bilberry, MD ED   02/21/2015  3:37 PM 02/24/2015  4:42 PM Full Code [id number]  Shaune Pollack, MD Inpatient    Advance Directive Documentation     Most Recent Value  Type of Advance Directive  Living will  Pre-existing out of facility DNR order (yellow form or pink MOST form)  -  "MOST" Form in Place?  -      TOTAL TIME TAKING CARE OF THIS  PATIENT:  38 minutes.    Enid Baas M.D on 12/25/2016 at 11:48 AM  Between 7am to 6pm - Pager - [phone]  After 6pm go to [email or web address] - Social research officer, government  [facility]  Office  [phone]  CC: Primary care physician; Angela Cox, MD   Note: This dictation was prepared with Dragon dictation along with smaller phrase technology. Any transcriptional errors that result from this process are unintentional.

## 2016-12-25 NOTE — Evaluation (Signed)
Physical Therapy Evaluation Patient Details Name: [name] MRN: [id number] DOB: [birth year] Today's Date: 12/25/2016   History of Present Illness  76 y.o. male UTI and LE edema, severe cough admitted to [facility] and noted CHF.  PMHx:  UTI, hematuria, urinary retention, BPH, Renal Failure, Kidney Disease, Right shoulder pain, Bipolar Disorder, COPD, DVT, GERD, High Cholesterol, HTN, Schizophrenia, and seizure.  Clinical Impression  Pt is up to transfer to chair with assistance, has a good sense of his limitations and was willing to be up in chair for noon meal.  He is expecting to get back to group home when feeling better, and should be appropriate for hospital PT care to increase independence with transfers and to decrease his need for HHPT when discharged.    Follow Up Recommendations Home health PT;Supervision for mobility/OOB    Equipment Recommendations  None recommended by PT    Recommendations for Other Services       Precautions / Restrictions Precautions Precautions: Fall Restrictions Weight Bearing Restrictions: No      Mobility  Bed Mobility Overal bed mobility: Needs Assistance Bed Mobility: Supine to Sit     Supine to sit: Min assist        Transfers Overall transfer level: Needs assistance Equipment used: 1 person hand held assist Transfers: Sit to/from Stand;Stand Pivot Transfers Sit to Stand: Min assist;Mod assist Stand pivot transfers: Min assist;Mod assist       General transfer comment: supported R knee to steady for transition to chair  Ambulation/Gait             General Gait Details: pt is not walking at home  Stairs            Wheelchair Mobility    Modified Rankin (Stroke Patients Only)       Balance Overall balance assessment: Needs assistance Sitting-balance support: Feet supported Sitting balance-Leahy Scale: Good     Standing balance support: Single extremity supported Standing balance-Leahy Scale: Fair                                Pertinent Vitals/Pain Pain Assessment: No/denies pain    Home Living Family/patient expects to be discharged to:: Assisted living               Home Equipment: Walker - 2 wheels;Wheelchair - Manufacturing systems engineer      Prior Function Level of Independence: Needs assistance   Gait / Transfers Assistance Needed: wheelchair for all mobility  ADL's / Homemaking Assistance Needed: staff assistance for bathing and transfers OOB but I for dressing and toilet transfers        Hand Dominance   Dominant Hand: Right    Extremity/Trunk Assessment   Upper Extremity Assessment Upper Extremity Assessment: Overall WFL for tasks assessed    Lower Extremity Assessment Lower Extremity Assessment: Generalized weakness    Cervical / Trunk Assessment Cervical / Trunk Assessment: Kyphotic  Communication   Communication: No difficulties  Cognition Arousal/Alertness: Awake/alert Behavior During Therapy: WFL for tasks assessed/performed Overall Cognitive Status: Within Functional Limits for tasks assessed                                        General Comments General comments (skin integrity, edema, etc.): Pt has been unassisted with AD at home bed to chair, just assisted by HHA with  staff of group home    Exercises     Assessment/Plan    PT Assessment Patient needs continued PT services  PT Problem List Decreased strength;Decreased range of motion;Decreased activity tolerance;Decreased balance;Decreased mobility;Decreased coordination;Decreased knowledge of use of DME;Decreased safety awareness;Cardiopulmonary status limiting activity       PT Treatment Interventions DME instruction;Gait training;Functional mobility training;Therapeutic activities;Therapeutic exercise;Balance training;Neuromuscular re-education;Patient/family education    PT Goals (Current goals can be found in the Care Plan section)  Acute Rehab PT  Goals Patient Stated Goal: to get up to chair PT Goal Formulation: With patient Time For Goal Achievement: 01/01/17 Potential to Achieve Goals: Good    Frequency Min 2X/week   Barriers to discharge   Pt will have assistance but needs AD    Co-evaluation               AM-PAC PT "6 Clicks" Daily Activity  Outcome Measure Difficulty turning over in bed (including adjusting bedclothes, sheets and blankets)?: None Difficulty moving from lying on back to sitting on the side of the bed? : Total Difficulty sitting down on and standing up from a chair with arms (e.g., wheelchair, bedside commode, etc,.)?: Total Help needed moving to and from a bed to chair (including a wheelchair)?: A Lot Help needed walking in hospital room?: A Lot Help needed climbing 3-5 steps with a railing? : Total 6 Click Score: 11    End of Session   Activity Tolerance: Patient tolerated treatment well;Patient limited by fatigue Patient left: in chair;with call bell/phone within reach;with chair alarm set Nurse Communication: Mobility status PT Visit Diagnosis: Unsteadiness on feet (R26.81);Difficulty in walking, not elsewhere classified (R26.2);Muscle weakness (generalized) (M62.81)    Time: [id number] PT Time Calculation (min) (ACUTE ONLY): 16 min   Charges:   PT Evaluation $PT Eval Low Complexity: 1 Procedure     PT G Codes:   PT G-Codes **NOT FOR INPATIENT CLASS** Functional Assessment Tool Used: AM-PAC 6 Clicks Basic Mobility    Ivar DrapeRuth E Nolan Tuazon 12/25/2016, 1:09 PM   Samul Dadauth Sharran Caratachea, PT MS Acute Rehab Dept. Number: [facility] [id number] and [facility] [phone]

## 2016-12-30 ENCOUNTER — Encounter: Payer: Medicare Other | Admitting: Surgery

## 2016-12-30 DIAGNOSIS — Z7982 Long term (current) use of aspirin: Secondary | ICD-10-CM | POA: Diagnosis not present

## 2016-12-30 DIAGNOSIS — J449 Chronic obstructive pulmonary disease, unspecified: Secondary | ICD-10-CM | POA: Diagnosis not present

## 2016-12-30 DIAGNOSIS — I509 Heart failure, unspecified: Secondary | ICD-10-CM | POA: Diagnosis not present

## 2016-12-30 DIAGNOSIS — K219 Gastro-esophageal reflux disease without esophagitis: Secondary | ICD-10-CM | POA: Diagnosis not present

## 2016-12-30 DIAGNOSIS — Z86718 Personal history of other venous thrombosis and embolism: Secondary | ICD-10-CM | POA: Diagnosis not present

## 2016-12-30 DIAGNOSIS — S81811A Laceration without foreign body, right lower leg, initial encounter: Secondary | ICD-10-CM | POA: Diagnosis not present

## 2016-12-30 DIAGNOSIS — F209 Schizophrenia, unspecified: Secondary | ICD-10-CM | POA: Diagnosis not present

## 2016-12-30 DIAGNOSIS — Z79899 Other long term (current) drug therapy: Secondary | ICD-10-CM | POA: Diagnosis not present

## 2016-12-30 DIAGNOSIS — G40909 Epilepsy, unspecified, not intractable, without status epilepticus: Secondary | ICD-10-CM | POA: Diagnosis not present

## 2016-12-30 DIAGNOSIS — W19XXXA Unspecified fall, initial encounter: Secondary | ICD-10-CM | POA: Diagnosis not present

## 2016-12-30 DIAGNOSIS — I890 Lymphedema, not elsewhere classified: Secondary | ICD-10-CM | POA: Diagnosis not present

## 2016-12-30 DIAGNOSIS — F319 Bipolar disorder, unspecified: Secondary | ICD-10-CM | POA: Diagnosis not present

## 2016-12-30 DIAGNOSIS — Z880 Allergy status to penicillin: Secondary | ICD-10-CM | POA: Diagnosis not present

## 2016-12-30 DIAGNOSIS — L97212 Non-pressure chronic ulcer of right calf with fat layer exposed: Secondary | ICD-10-CM | POA: Diagnosis not present

## 2016-12-30 DIAGNOSIS — Z7901 Long term (current) use of anticoagulants: Secondary | ICD-10-CM | POA: Diagnosis not present

## 2017-01-01 NOTE — Progress Notes (Signed)
KALLIN, HENK ([id number]) Visit Report for 12/30/2016 Chief Complaint Document Details Patient Name: [name], [name] Date of Service: 12/30/2016 8:00 AM Medical Record Number: [id number] Patient Account Number: [id number] Date of Birth/Sex: 03-Aug-1941 (76 y.o. Male) Treating RN: [name] Primary Care Provider: Karlene [name] Other Clinician: Referring Provider: Daryel [name] Treating Provider/Extender: [name] in Treatment: 5 Information Obtained from: Patient Chief Complaint Patient presents to the wound care center for a consult due non healing wound. The non-healing wound of the right lower extremity which has been present for 4 days. Electronic Signature(s) Signed: 12/30/2016 8:55:32 AM By: [name] [name] Entered By: [name] on 12/30/2016 08:55:32 [name], [name] ([id number]) -------------------------------------------------------------------------------- HPI Details Patient Name: [name] Date of Service: 12/30/2016 8:00 AM Medical Record Number: [id number] Patient Account Number: [id number] Date of Birth/Sex: 1940/11/11 (76 y.o. Male) Treating RN: [name] Primary Care Provider: Karlene [name] Other Clinician: Referring Provider: Daryel [name] Treating Provider/Extender: [name] in Treatment: 5 History of Present Illness Location: right lower extremity wound Quality: Patient reports experiencing a dull pain to affected area(s). Severity: Patient states wound are getting worse. Duration: Patient has had the wound for 4 days prior to presenting for treatment Timing: Pain in wound is Intermittent (comes and goes Context: The wound occurred when the patient had a fall and cut his leg which required several sutures and staples. Modifying Factors: Consults to this date include:he has had bilateral lower limb lymphedema and has been using lymphedema pumps. Associated Signs and Symptoms: Patient  reports having difficulty standing for long periods. HPI Description: 76 year old male was recently seen in the ER on 11/18/16, due to a fall and had a large right lower leg laceration. past medical history is significant for bipolar disorder, CHF, COPD, DVT, schizophrenia and seizure disorder. In the past he has also been treated by the vascular surgeons for lymphedema and was recommended compression stockings and lymphedema pump. In the ER a right leg x-ray showed soft tissue swelling without any acute bony abnormality and there was demineralization with degenerative changes of the right knee. he had a repair of his wound and his anticoagulation was held for 24 hours. He was asked to follow-up with the wound care department. ER notes the length of the wound was 30 cm and the depth was 10 mm. 3-0 nylon horizontal mattress sutures and multiple staples were used to close the wound. he was prescribed Keflex by the ER. Of note the patient does not have a follow-up appointment at the ER and his care has been transferred over to our wound center 12/06/2016 -- his caregiver tells me that he was treated for her diarrhea with ciprofloxacin and probiotics and this was put on by his PCP after an appropriate stool culture was reviewed. 12/30/2016 -- he was recently admitted to the hospital between 12/23/2016 and 12/25/2016 for proctocolitis, urinary tract infection with hematuria and syncope. a CT confirmed proctocolitis and he was given ciprofloxacin for 6 days. He also was given supportive care for his other ailments. The dressing he had on his right lower extremity was not done in the proper manner and had significant pedal edema and edema in the area below his knee ======== Old notes: 76 year old gentleman who has a history of seizures and schizophrenia with bipolar disorder enhances in a assisted living facility. He also has CHF, COPD, GERD and remote history of DVT. He has been treated by the  vascular group at [facility] for bilateral lymphedema and has  been using lymphedema pumps. This problem started on September 30 when he had a lacerated wound after a fall. The staples were left in for about 10 days and when the staples were removed the wound opened out. Since then he's had several [name], [name] ([id number]) issues with wound infection and the most recent culture showed a heavy growth of Pseudomonas aeruginosa and Staphylococcus aureus and he has received clindamycin and the most recent past and from today he is on levofloxacin. Addendum:Notes were reviewed from a previous vascular consult at [facility] by [name] who saw him on 05/27/2014. He had recommended graduated compression stockings class I and also added lymph pumps to improve the control of the patient's lymphedema. 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 Diffuse soft-tissue swelling correlate with bone scan recommended. 07/15/2015 -- he is still awaiting insurance clearance for his MRI and other than that he has no other changes. 07/21/2015 -- he now has the insurance clearance and his MRI has been scheduled for December 28th. 08/11/2014 -- the MRI of his left lower extremity showed there was no bone destruction or periosteal reaction and there was a soft tissue wound along the lateral aspect of the distal left lower leg with skin thickening but there was no drainable fluid collection to suggest an abscess. ======= Electronic Signature(s) Signed: 12/30/2016 8:57:40 AM By: [name] KannerBritto, Keyonte Cookston [name] Entered By: [name] KannerBritto, Journi Moffa on 12/30/2016 08:57:40 [name], [name] HutchingJERRY L. ([id number]) -------------------------------------------------------------------------------- Physical Exam Details Patient Name: [name], [name] Date of Service: 12/30/2016 8:00 AM Medical Record Number: [id number] Patient  Account Number: [id number] Date of Birth/Sex: 07/28/1941 [age] y.o. Male) Treating RN: [name] Sitesorthy, Joanna Primary Care Provider: Karlene EinsteinASANAYAKA, GAYANI Other Clinician: Referring Provider: Daryel NovemberWILLIAMS, JONATHAN Treating Provider/Extender: [name] ReBritto, Lynnie Koehler Weeks in Treatment: 5 Constitutional . Pulse regular. Respirations normal and unlabored. Afebrile. . Eyes Nonicteric. Reactive to light. Ears, Nose, Mouth, and Throat Lips, teeth, and gums WNL.Marland Kitchen. Moist mucosa without lesions. Neck supple and nontender. No palpable supraclavicular or cervical adenopathy. Normal sized without goiter. Respiratory WNL. No retractions.. Breath sounds WNL, No rubs, rales, rhonchi, or wheeze.. Cardiovascular . Pedal Pulses WNL. ABI was noncompressible at 1.61. he had pedal edema and edema in the area of the thigh and below knee.. Chest Breasts symmetical and no nipple discharge.. Breast tissue WNL, no masses, lumps, or tenderness.. Lymphatic No adneopathy. No adenopathy. No adenopathy. Musculoskeletal Adexa without tenderness or enlargement.. Digits and nails w/o clubbing, cyanosis, infection, petechiae, ischemia, or inflammatory conditions.. Integumentary (Hair, Skin) No suspicious lesions. No crepitus or fluctuance. No peri-wound warmth or erythema. No masses.Marland Kitchen. Psychiatric Judgement and insight Intact.. No evidence of depression, anxiety, or agitation.. Notes the patient's Steri-Strips are still in place and the area in the mid part of the wound where he has subcutaneous debris is fairly clean and I washed this out with moist saline gauze. Electronic Signature(s) Signed: 12/30/2016 8:59:07 AM By: [name] KannerBritto, Arynn Armand [name] Entered By: [name] KannerBritto, Myking Sar on 12/30/2016 08:59:07 Debes, [name] HutchingJERRY L. ([id number]) -------------------------------------------------------------------------------- Physician Orders Details Patient Name: [name], Genevieve L. Date of Service: 12/30/2016 8:00 AM Medical Record Number: [id number] Patient  Account Number: [id number] Date of Birth/Sex: 07/28/1941 [age] y.o. Male) Treating RN: [name] Sitesorthy, Joanna Primary Care Provider: Karlene EinsteinASANAYAKA, GAYANI Other Clinician: Referring Provider: Daryel NovemberWILLIAMS, JONATHAN Treating Provider/Extender: [name] ReBritto, Maesyn Frisinger Weeks in Treatment: 5 Verbal / Phone Orders: Yes Clinician: Curtis Sitesorthy, Joanna Read Back and Verified: Yes Diagnosis Coding Wound Cleansing Wound #2 Right,Proximal,Anterior Lower Leg o  Clean wound with Normal Saline. Wound #3 Right,Distal Lower Leg o Clean wound with Normal Saline. Anesthetic Wound #2 Right,Proximal,Anterior Lower Leg o Topical Lidocaine 4% cream applied to wound bed prior to debridement - for clinic use Wound #3 Right,Distal Lower Leg o Topical Lidocaine 4% cream applied to wound bed prior to debridement - for clinic use Skin Barriers/Peri-Wound Care o Moisturizing lotion - not on wounds Primary Wound Dressing Wound #2 Right,Proximal,Anterior Lower Leg o Hydrafera Blue - ready transfer o Other: - steri-strips (DO NOT REMOVE) Wound #3 Right,Distal Lower Leg o Other: - steri-strips (DO NOT REMOVE) Secondary Dressing Wound #2 Right,Proximal,Anterior Lower Leg o ABD pad Wound #3 Right,Distal Lower Leg o ABD pad Dressing Change Frequency Wound #2 Right,Proximal,Anterior Lower Leg o Three times weekly - HHRN to change on Wednesdays and Fridays and pt to come have it changed at wound care center on Mondays. [name], [name] ([id number]) Wound #3 Right,Distal Lower Leg o Three times weekly - HHRN to change on Wednesdays and Fridays and pt to come have it changed at wound care center on Mondays. Follow-up Appointments Wound #2 Right,Proximal,Anterior Lower Leg o Return Appointment in 1 week. Wound #3 Right,Distal Lower Leg o Return Appointment in 1 week. Edema Control Wound #2 Right,Proximal,Anterior Lower Leg o Kerlix and Coban - Right Lower Extremity - wrap 3cm from knee and 3cm from toes please  follow this o Elevate legs to the level of the heart and pump ankles as often as possible Wound #3 Right,Distal Lower Leg o Kerlix and Coban - Right Lower Extremity - wrap 3cm from knee and 3cm from toes please follow this o Elevate legs to the level of the heart and pump ankles as often as possible Additional Orders / Instructions Wound #2 Right,Proximal,Anterior Lower Leg o Increase protein intake. Wound #3 Right,Distal Lower Leg o Increase protein intake. Home Health Wound #2 Right,Proximal,Anterior Lower Leg o Continue Home Health Visits - [facility] o Home Health Nurse may visit PRN to address patientos wound care needs. o FACE TO FACE ENCOUNTER: MEDICARE and MEDICAID PATIENTS: I certify that this patient is under my care and that I had a face-to-face encounter that meets the physician face-to-face encounter requirements with this patient on this date. The encounter with the patient was in whole or in part for the following MEDICAL CONDITION: (primary reason for Home Healthcare) MEDICAL NECESSITY: I certify, that based on my findings, NURSING services are a medically necessary home health service. HOME BOUND STATUS: I certify that my clinical findings support that this patient is homebound (i.e., Due to illness or injury, pt requires aid of supportive devices such as crutches, cane, wheelchairs, walkers, the use of special transportation or the assistance of another person to leave their place of residence. There is a normal inability to leave the home and doing so requires considerable and taxing effort. Other absences are for medical reasons / religious services and are infrequent or of short duration when for other reasons). [name], [name] ([id number]) o If current dressing causes regression in wound condition, may D/C ordered dressing product/s and apply Normal Saline Moist Dressing daily until next Wound Healing Center / Other MD appointment. Notify Wound Healing  Center of regression in wound condition at [phone]. o Please direct any NON-WOUND related issues/requests for orders to patient's Primary Care Physician Wound #3 Right,Distal Lower Leg o Continue Home Health Visits - [facility] o Home Health Nurse may visit PRN to address patientos wound care needs. o FACE TO FACE ENCOUNTER: MEDICARE  and MEDICAID PATIENTS: I certify that this patient is under my care and that I had a face-to-face encounter that meets the physician face-to-face encounter requirements with this patient on this date. The encounter with the patient was in whole or in part for the following MEDICAL CONDITION: (primary reason for Home Healthcare) MEDICAL NECESSITY: I certify, that based on my findings, NURSING services are a medically necessary home health service. HOME BOUND STATUS: I certify that my clinical findings support that this patient is homebound (i.e., Due to illness or injury, pt requires aid of supportive devices such as crutches, cane, wheelchairs, walkers, the use of special transportation or the assistance of another person to leave their place of residence. There is a normal inability to leave the home and doing so requires considerable and taxing effort. Other absences are for medical reasons / religious services and are infrequent or of short duration when for other reasons). o If current dressing causes regression in wound condition, may D/C ordered dressing product/s and apply Normal Saline Moist Dressing daily until next Wound Healing Center / Other MD appointment. Notify Wound Healing Center of regression in wound condition at [phone]. o Please direct any NON-WOUND related issues/requests for orders to patient's Primary Care Physician Medications-please add to medication list. Wound #2 Right,Proximal,Anterior Lower Leg o Other: - Vitamin C, Zinc, Multivitamin Wound #3 Right,Distal Lower Leg o Other: - Vitamin C, Zinc,  Multivitamin Electronic Signature(s) Signed: 12/30/2016 4:20:02 PM By: [name] [name] Signed: 12/30/2016 4:48:26 PM By: [name] Entered By: [name] on 12/30/2016 08:40:45 Modica, [name] ([id number]) -------------------------------------------------------------------------------- Problem List Details Patient Name: [name] Date of Service: 12/30/2016 8:00 AM Medical Record Number: [id number] Patient Account Number: [id number] Date of Birth/Sex: 10/19/1940 (76 y.o. Male) Treating RN: [name] Primary Care Provider: Karlene [name] Other Clinician: Referring Provider: Daryel [name] Treating Provider/Extender: [name] in Treatment: 5 Active Problems ICD-10 Encounter Code Description Active Date Diagnosis I89.0 Lymphedema, not elsewhere classified 11/22/2016 Yes I50.9 Heart failure, unspecified 11/22/2016 Yes S81.811A Laceration without foreign body, right lower leg, initial 11/22/2016 Yes encounter L97.212 Non-pressure chronic ulcer of right calf with fat layer 11/22/2016 Yes exposed Inactive Problems Resolved Problems Electronic Signature(s) Signed: 12/30/2016 8:55:16 AM By: [name] [name] Entered By: [name] on 12/30/2016 08:55:15 Tiano, [name] ([id number]) -------------------------------------------------------------------------------- Progress Note Details Patient Name: [name] Date of Service: 12/30/2016 8:00 AM Medical Record Number: [id number] Patient Account Number: [id number] Date of Birth/Sex: 06-Jul-1941 (76 y.o. Male) Treating RN: [name] Primary Care Provider: Karlene [name] Other Clinician: Referring Provider: Daryel [name] Treating Provider/Extender: [name] in Treatment: 5 Subjective Chief Complaint Information obtained from Patient Patient presents to the wound care center for a consult due non healing wound. The non-healing wound of the right lower  extremity which has been present for 4 days. History of Present Illness (HPI) The following HPI elements were documented for the patient's wound: Location: right lower extremity wound Quality: Patient reports experiencing a dull pain to affected area(s). Severity: Patient states wound are getting worse. Duration: Patient has had the wound for 4 days prior to presenting for treatment Timing: Pain in wound is Intermittent (comes and goes Context: The wound occurred when the patient had a fall and cut his leg which required several sutures and staples. Modifying Factors: Consults to this date include:he has had bilateral lower limb lymphedema and has been using lymphedema pumps. Associated Signs and Symptoms: Patient reports having difficulty standing for long periods. 76 year old  male was recently seen in the ER on 11/18/16, due to a fall and had a large right lower leg laceration. past medical history is significant for bipolar disorder, CHF, COPD, DVT, schizophrenia and seizure disorder. In the past he has also been treated by the vascular surgeons for lymphedema and was recommended compression stockings and lymphedema pump. In the ER a right leg x-ray showed soft tissue swelling without any acute bony abnormality and there was demineralization with degenerative changes of the right knee. he had a repair of his wound and his anticoagulation was held for 24 hours. He was asked to follow-up with the wound care department. ER notes the length of the wound was 30 cm and the depth was 10 mm. 3-0 nylon horizontal mattress sutures and multiple staples were used to close the wound. he was prescribed Keflex by the ER. Of note the patient does not have a follow-up appointment at the ER and his care has been transferred over to our wound center 12/06/2016 -- his caregiver tells me that he was treated for her diarrhea with ciprofloxacin and probiotics and this was put on by his PCP after an appropriate  stool culture was reviewed. 12/30/2016 -- he was recently admitted to the hospital between 12/23/2016 and 12/25/2016 for proctocolitis, urinary tract infection with hematuria and syncope. a CT confirmed proctocolitis and he was given ciprofloxacin for 6 days. He also was given supportive care for his other ailments. The dressing he had on his right lower extremity was not done in the proper manner and had significant pedal edema and edema in the area below his knee [name], [name] ([id number]) ======== Old notes: 76 year old gentleman who has a history of seizures and schizophrenia with bipolar disorder enhances in a assisted living facility. He also has CHF, COPD, GERD and remote history of DVT. He has been treated by the vascular group at [facility] for bilateral lymphedema and has been using lymphedema pumps. This problem started on September 30 when he had a lacerated wound after a fall. The staples were left in for about 10 days and when the staples were removed the wound opened out. Since then he's had several issues with wound infection and the most recent culture showed a heavy growth of Pseudomonas aeruginosa and Staphylococcus aureus and he has received clindamycin and the most recent past and from today he is on levofloxacin. Addendum:Notes were reviewed from a previous vascular consult at [facility] by Dr. Gilda Crease who saw him on 05/27/2014. He had recommended graduated compression stockings class I and also added lymph pumps to improve the control of the patient's lymphedema. 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 Diffuse soft-tissue swelling correlate with bone scan recommended. 07/15/2015 -- he is still awaiting insurance clearance for his MRI and other than that he has no other changes. 07/21/2015 -- he now has the insurance clearance and his MRI has  been scheduled for December 28th. 08/11/2014 -- the MRI of his left lower extremity showed there was no bone destruction or periosteal reaction and there was a soft tissue wound along the lateral aspect of the distal left lower leg with skin thickening but there was no drainable fluid collection to suggest an abscess. ======= Objective Constitutional Pulse regular. Respirations normal and unlabored. Afebrile. Vitals Time Taken: 8:12 AM, Height: 70 in, Weight: 253 lbs, BMI: 36.3, Pulse: 78 bpm, Respiratory Rate: 18 breaths/min, Blood Pressure: 103/56 mmHg.  Eyes Nonicteric. Reactive to light. Ears, Nose, Mouth, and Throat Lips, teeth, and gums WNL.Marland Kitchen Moist mucosa without lesions. Neck supple and nontender. No palpable supraclavicular or cervical adenopathy. Normal sized without goiter. Choo, Liahm L. ([id number]) Respiratory WNL. No retractions.. Breath sounds WNL, No rubs, rales, rhonchi, or wheeze.. Cardiovascular Pedal Pulses WNL. ABI was noncompressible at 1.61. he had pedal edema and edema in the area of the thigh and below knee.. Chest Breasts symmetical and no nipple discharge.. Breast tissue WNL, no masses, lumps, or tenderness.. Lymphatic No adneopathy. No adenopathy. No adenopathy. Musculoskeletal Adexa without tenderness or enlargement.. Digits and nails w/o clubbing, cyanosis, infection, petechiae, ischemia, or inflammatory conditions.Marland Kitchen Psychiatric Judgement and insight Intact.. No evidence of depression, anxiety, or agitation.. General Notes: the patient's Steri-Strips are still in place and the area in the mid part of the wound where he has subcutaneous debris is fairly clean and I washed this out with moist saline gauze. Integumentary (Hair, Skin) No suspicious lesions. No crepitus or fluctuance. No peri-wound warmth or erythema. No masses.. Wound #2 status is Open. Original cause of wound was Trauma. The wound is located on the Right,Proximal,Anterior Lower Leg.  The wound measures 1.6cm length x 14cm width x 0.1cm depth; 17.593cm^2 area and 1.759cm^3 volume. The wound is limited to skin breakdown. There is no tunneling or undermining noted. There is a large amount of serosanguineous drainage noted. The wound margin is distinct with the outline attached to the wound base. There is medium (34-66%) red granulation within the wound bed. There is a medium (34-66%) amount of necrotic tissue within the wound bed including Adherent Slough. Periwound temperature was noted as No Abnormality. The periwound has tenderness on palpation. Wound #3 status is Open. Original cause of wound was Trauma. The wound is located on the Right,Distal Lower Leg. The wound measures 0.2cm length x 4.5cm width x 0.1cm depth; 0.707cm^2 area and 0.071cm^3 volume. There is no tunneling or undermining noted. There is a large amount of serosanguineous drainage noted. The wound margin is distinct with the outline attached to the wound base. There is medium (34-66%) red granulation within the wound bed. There is a medium (34-66%) amount of necrotic tissue within the wound bed. Periwound temperature was noted as No Abnormality. The periwound has tenderness on palpation. Assessment [name], [name] ([id number]) Active Problems ICD-10 I89.0 - Lymphedema, not elsewhere classified I50.9 - Heart failure, unspecified S81.811A - Laceration without foreign body, right lower leg, initial encounter L97.212 - Non-pressure chronic ulcer of right calf with fat layer exposed Plan Wound Cleansing: Wound #2 Right,Proximal,Anterior Lower Leg: Clean wound with Normal Saline. Wound #3 Right,Distal Lower Leg: Clean wound with Normal Saline. Anesthetic: Wound #2 Right,Proximal,Anterior Lower Leg: Topical Lidocaine 4% cream applied to wound bed prior to debridement - for clinic use Wound #3 Right,Distal Lower Leg: Topical Lidocaine 4% cream applied to wound bed prior to debridement - for clinic  use Skin Barriers/Peri-Wound Care: Moisturizing lotion - not on wounds Primary Wound Dressing: Wound #2 Right,Proximal,Anterior Lower Leg: Hydrafera Blue - ready transfer Other: - steri-strips (DO NOT REMOVE) Wound #3 Right,Distal Lower Leg: Other: - steri-strips (DO NOT REMOVE) Secondary Dressing: Wound #2 Right,Proximal,Anterior Lower Leg: ABD pad Wound #3 Right,Distal Lower Leg: ABD pad Dressing Change Frequency: Wound #2 Right,Proximal,Anterior Lower Leg: Three times weekly - HHRN to change on Wednesdays and Fridays and pt to come have it changed at wound care center on Mondays. Wound #3 Right,Distal Lower Leg: Three times weekly - HHRN to change on Wednesdays  and Fridays and pt to come have it changed at wound care center on Mondays. Follow-up Appointments: Wound #2 Right,Proximal,Anterior Lower Leg: Return Appointment in 1 week. Wound #3 Right,Distal Lower Leg: Return Appointment in 1 week. [name], MCELROY ([id number]) Edema Control: Wound #2 Right,Proximal,Anterior Lower Leg: Kerlix and Coban - Right Lower Extremity - wrap 3cm from knee and 3cm from toes please follow this Elevate legs to the level of the heart and pump ankles as often as possible Wound #3 Right,Distal Lower Leg: Kerlix and Coban - Right Lower Extremity - wrap 3cm from knee and 3cm from toes please follow this Elevate legs to the level of the heart and pump ankles as often as possible Additional Orders / Instructions: Wound #2 Right,Proximal,Anterior Lower Leg: Increase protein intake. Wound #3 Right,Distal Lower Leg: Increase protein intake. Home Health: Wound #2 Right,Proximal,Anterior Lower Leg: Continue Home Health Visits - [facility] Home Health Nurse may visit PRN to address patient s wound care needs. FACE TO FACE ENCOUNTER: MEDICARE and MEDICAID PATIENTS: I certify that this patient is under my care and that I had a face-to-face encounter that meets the physician face-to-face  encounter requirements with this patient on this date. The encounter with the patient was in whole or in part for the following MEDICAL CONDITION: (primary reason for Home Healthcare) MEDICAL NECESSITY: I certify, that based on my findings, NURSING services are a medically necessary home health service. HOME BOUND STATUS: I certify that my clinical findings support that this patient is homebound (i.e., Due to illness or injury, pt requires aid of supportive devices such as crutches, cane, wheelchairs, walkers, the use of special transportation or the assistance of another person to leave their place of residence. There is a normal inability to leave the home and doing so requires considerable and taxing effort. Other absences are for medical reasons / religious services and are infrequent or of short duration when for other reasons). If current dressing causes regression in wound condition, may D/C ordered dressing product/s and apply Normal Saline Moist Dressing daily until next Wound Healing Center / Other MD appointment. Notify Wound Healing Center of regression in wound condition at [phone]. Please direct any NON-WOUND related issues/requests for orders to patient's Primary Care Physician Wound #3 Right,Distal Lower Leg: Continue Home Health Visits - [facility] Home Health Nurse may visit PRN to address patient s wound care needs. FACE TO FACE ENCOUNTER: MEDICARE and MEDICAID PATIENTS: I certify that this patient is under my care and that I had a face-to-face encounter that meets the physician face-to-face encounter requirements with this patient on this date. The encounter with the patient was in whole or in part for the following MEDICAL CONDITION: (primary reason for Home Healthcare) MEDICAL NECESSITY: I certify, that based on my findings, NURSING services are a medically necessary home health service. HOME BOUND STATUS: I certify that my clinical findings support that this patient is  homebound (i.e., Due to illness or injury, pt requires aid of supportive devices such as crutches, cane, wheelchairs, walkers, the use of special transportation or the assistance of another person to leave their place of residence. There is a normal inability to leave the home and doing so requires considerable and taxing effort. Other absences are for medical reasons / religious services and are infrequent or of short duration when for other reasons). If current dressing causes regression in wound condition, may D/C ordered dressing product/s and apply Normal Saline Moist Dressing daily until next Wound Healing Center / Other MD  appointment. Notify Wound Healing Center of regression in wound condition at [phone]. Please direct any NON-WOUND related issues/requests for orders to patient's Primary Care Physician Medications-please add to medication list.: Wound #2 Right,Proximal,Anterior Lower Leg: Other: - Vitamin C, Zinc, Multivitamin Wound #3 Right,Distal Lower Leg: Mccolm, Dmitry L. ([id number]) Other: - Vitamin C, Zinc, Multivitamin After a careful review of his recent inpatient admission, I have recommended: 1. cover the wound with Hydrofera Blue and then apply a light Kerlix and Coban and wrap from his toes to below the knee. Should be changed 3 times a week 2. he may use his lymphedema pumps twice a day for an hour each 3. Elevate his limbs as much as possible and has discussed this in great detail with the caregivers 4. Adequate protein, vitamin A, vitamin C and zinc 5. Review of the wound center every week Electronic Signature(s) Signed: 12/30/2016 9:00:12 AM By: [name] [name] Entered By: [name] on 12/30/2016 09:00:11 Berges, [name] ([id number]) -------------------------------------------------------------------------------- SuperBill Details Patient Name: [name] Date of Service: 12/30/2016 Medical Record Number: [id number] Patient Account  Number: [id number] Date of Birth/Sex: 04-28-1941 (76 y.o. Male) Treating RN: [name] Primary Care Provider: Karlene [name] Other Clinician: Referring Provider: Daryel [name] Treating Provider/Extender: [name] in Treatment: 5 Diagnosis Coding ICD-10 Codes Code Description I89.0 Lymphedema, not elsewhere classified I50.9 Heart failure, unspecified S81.811A Laceration without foreign body, right lower leg, initial encounter L97.212 Non-pressure chronic ulcer of right calf with fat layer exposed Facility Procedures CPT4 Code: [id number] Description: 99213 - WOUND CARE VISIT-LEV 3 EST PT Modifier: Quantity: 1 Physician Procedures CPT4 Code Description: [id number] - WC PHYS LEVEL 3 - EST PT ICD-10 Description Diagnosis I89.0 Lymphedema, not elsewhere classified S81.811A Laceration without foreign body, right lower leg, init I50.9 Heart failure, unspecified L97.212 Non-pressure  chronic ulcer of right calf with fat laye Modifier: ial encounte r exposed Quantity: 1 r Electronic Signature(s) Signed: 12/30/2016 10:36:37 AM By: [name] Signed: 12/30/2016 4:20:02 PM By: [name] [name] Previous Signature: 12/30/2016 9:00:26 AM Version By: [name] [name] Entered By: [name] on 12/30/2016 10:36:36

## 2017-01-01 NOTE — Progress Notes (Signed)
[name] ([id number]) Visit Report for 12/30/2016 Arrival Information Details Patient Name: [name] Date of Service: 12/30/2016 8:00 AM Medical Record Number: [id number] Patient Account Number: [id number] Date of Birth/Sex: 1941-03-19 (76 y.o. Male) Treating RN: Curtis Sites Primary Care Toshiko Kemler: Karlene Einstein Other Clinician: Referring Latronda Spink: Daryel November Treating Matsuko Kretz/Extender: Rudene Re in Treatment: 5 Visit Information History Since Last Visit Added or deleted any medications: No Patient Arrived: Wheel Chair Any new allergies or adverse reactions: No Arrival Time: 08:10 Had a fall or experienced change in No activities of daily living that may affect Accompanied By: staff risk of falls: Transfer Assistance: Hoyer Lift Signs or symptoms of abuse/neglect since last No Patient Identification Verified: Yes visito Secondary Verification Process Yes Hospitalized since last visit: No Completed: Has Dressing in Place as Prescribed: Yes Patient Requires Transmission-Based No Has Compression in Place as Prescribed: Yes Precautions: Pain Present Now: Yes Patient Has Alerts: No Electronic Signature(s) Signed: 12/30/2016 4:48:26 PM By: Curtis Sites Entered By: Curtis Sites on 12/30/2016 08:10:27 [name], [name] ([id number]) -------------------------------------------------------------------------------- Clinic Level of Care Assessment Details Patient Name: [name] Date of Service: 12/30/2016 8:00 AM Medical Record Number: [id number] Patient Account Number: [id number] Date of Birth/Sex: Feb 20, 1941 (76 y.o. Male) Treating RN: Curtis Sites Primary Care Maleeka Sabatino: Karlene Einstein Other Clinician: Referring Adrienne Delay: Daryel November Treating Marshella Tello/Extender: Rudene Re in Treatment: 5 Clinic Level of Care Assessment Items TOOL 4 Quantity Score []  - Use when only an EandM is performed on FOLLOW-UP visit  0 ASSESSMENTS - Nursing Assessment / Reassessment X - Reassessment of Co-morbidities (includes updates in patient status) 1 10 X - Reassessment of Adherence to Treatment Plan 1 5 ASSESSMENTS - Wound and Skin Assessment / Reassessment X - Simple Wound Assessment / Reassessment - one wound 1 5 []  - Complex Wound Assessment / Reassessment - multiple wounds 0 []  - Dermatologic / Skin Assessment (not related to wound area) 0 ASSESSMENTS - Focused Assessment []  - Circumferential Edema Measurements - multi extremities 0 []  - Nutritional Assessment / Counseling / Intervention 0 X - Lower Extremity Assessment (monofilament, tuning fork, pulses) 1 5 []  - Peripheral Arterial Disease Assessment (using hand held doppler) 0 ASSESSMENTS - Ostomy and/or Continence Assessment and Care []  - Incontinence Assessment and Management 0 []  - Ostomy Care Assessment and Management (repouching, etc.) 0 PROCESS - Coordination of Care X - Simple Patient / Family Education for ongoing care 1 15 []  - Complex (extensive) Patient / Family Education for ongoing care 0 []  - Staff obtains Chiropractor, Records, Test Results / Process Orders 0 []  - Staff telephones HHA, Nursing Homes / Clarify orders / etc 0 []  - Routine Transfer to another Facility (non-emergent condition) 0 Kugelman, Johnmark L. ([id number]) []  - Routine Hospital Admission (non-emergent condition) 0 []  - New Admissions / Manufacturing engineer / Ordering NPWT, Apligraf, etc. 0 []  - Emergency Hospital Admission (emergent condition) 0 X - Simple Discharge Coordination 1 10 []  - Complex (extensive) Discharge Coordination 0 PROCESS - Special Needs []  - Pediatric / Minor Patient Management 0 []  - Isolation Patient Management 0 []  - Hearing / Language / Visual special needs 0 []  - Assessment of Community assistance (transportation, D/C planning, etc.) 0 []  - Additional assistance / Altered mentation 0 []  - Support Surface(s) Assessment (bed, cushion, seat, etc.)  0 INTERVENTIONS - Wound Cleansing / Measurement X - Simple Wound Cleansing - one wound 1 5 []  - Complex Wound Cleansing - multiple wounds 0 X - Wound Imaging (photographs -  any number of wounds) 1 5 []  - Wound Tracing (instead of photographs) 0 X - Simple Wound Measurement - one wound 1 5 []  - Complex Wound Measurement - multiple wounds 0 INTERVENTIONS - Wound Dressings []  - Small Wound Dressing one or multiple wounds 0 []  - Medium Wound Dressing one or multiple wounds 0 X - Large Wound Dressing one or multiple wounds 1 20 []  - Application of Medications - topical 0 []  - Application of Medications - injection 0 INTERVENTIONS - Miscellaneous []  - External ear exam 0 [name], [name] ([id number]) []  - Specimen Collection (cultures, biopsies, blood, body fluids, etc.) 0 []  - Specimen(s) / Culture(s) sent or taken to Lab for analysis 0 X - Patient Transfer (multiple staff / Michiel Sites Lift / Similar devices) 1 10 []  - Simple Staple / Suture removal (25 or less) 0 []  - Complex Staple / Suture removal (26 or more) 0 []  - Hypo / Hyperglycemic Management (close monitor of Blood Glucose) 0 []  - Ankle / Brachial Index (ABI) - do not check if billed separately 0 X - Vital Signs 1 5 Has the patient been seen at the hospital within the last three years: Yes Total Score: 100 Level Of Care: New/Established - Level 3 Electronic Signature(s) Signed: 12/30/2016 4:48:26 PM By: Curtis Sites Entered By: Curtis Sites on 12/30/2016 10:36:28 [name] ([id number]) -------------------------------------------------------------------------------- Encounter Discharge Information Details Patient Name: [name] Date of Service: 12/30/2016 8:00 AM Medical Record Number: [id number] Patient Account Number: [id number] Date of Birth/Sex: 1941-05-25 (76 y.o. Male) Treating RN: Curtis Sites Primary Care Aveline Daus: Karlene Einstein Other Clinician: Referring Darci Lykins: Daryel November Treating  Amerah Puleo/Extender: Rudene Re in Treatment: 5 Encounter Discharge Information Items Discharge Pain Level: 0 Discharge Condition: Stable Ambulatory Status: Wheelchair Discharge Destination: Home Transportation: Private Auto Accompanied By: staff Schedule Follow-up Appointment: Yes Medication Reconciliation completed and provided to Patient/Care No Jakeb Lamping: Provided on Clinical Summary of Care: 12/30/2016 Form Type Recipient Paper Patient JC Electronic Signature(s) Signed: 12/30/2016 8:47:54 AM By: Gwenlyn Perking Entered By: Gwenlyn Perking on 12/30/2016 08:47:53 Ruffini, [name] ([id number]) -------------------------------------------------------------------------------- Lower Extremity Assessment Details Patient Name: [name] Date of Service: 12/30/2016 8:00 AM Medical Record Number: [id number] Patient Account Number: [id number] Date of Birth/Sex: 01-03-1941 (76 y.o. Male) Treating RN: Curtis Sites Primary Care Khya Halls: Karlene Einstein Other Clinician: Referring Lashundra Shiveley: Daryel November Treating Gael Londo/Extender: Rudene Re in Treatment: 5 Vascular Assessment Pulses: Dorsalis Pedis Palpable: [Right:Yes] Posterior Tibial Extremity colors, hair growth, and conditions: Extremity Color: [Right:Hyperpigmented] Hair Growth on Extremity: [Right:No] Temperature of Extremity: [Right:Cool] Capillary Refill: [Right:< 3 seconds] Blood Pressure: Brachial: [Right:103] Dorsalis Pedis: [Left:Dorsalis Pedis: 166] Ankle: Posterior Tibial: [Left:Posterior Tibial: 140] [Right:1.61] Toe Nail Assessment Left: Right: Thick: Yes Discolored: Yes Deformed: Yes Improper Length and Hygiene: No Electronic Signature(s) Signed: 12/30/2016 4:48:26 PM By: Curtis Sites Entered By: Curtis Sites on 12/30/2016 08:33:42 Group, Naod Elbert Ewings ([id number]) -------------------------------------------------------------------------------- Multi Wound Chart  Details Patient Name: [name] Date of Service: 12/30/2016 8:00 AM Medical Record Number: [id number] Patient Account Number: [id number] Date of Birth/Sex: [birth year] (76 y.o. Male) Treating RN: Curtis Sites Primary Care Mayley Lish: Karlene Einstein Other Clinician: Referring Foxx Klarich: Daryel November Treating Marcie Shearon/Extender: Rudene Re in Treatment: 5 Vital Signs Height(in): 70 Pulse(bpm): 78 Weight(lbs): 253 Blood Pressure 103/56 (mmHg): Body Mass Index(BMI): 36 Temperature(F): Respiratory Rate 18 (breaths/min): Photos: [2:No Photos] [3:No Photos] [N/A:N/A] Wound Location: [2:Right Lower Leg - Anterior, Proximal] [3:Right Lower Leg - Distal] [N/A:N/A] Wounding Event: [2:Trauma] [3:Trauma] [N/A:N/A] Primary Etiology: [2:Trauma,  Other] [3:Trauma, Other] [N/A:N/A] Comorbid History: [2:Lymphedema, Chronic Obstructive Pulmonary Disease (COPD), Sleep Apnea, Congestive Heart Failure, Deep Vein Thrombosis, Hypertension, Seizure Disorder] [3:Lymphedema, Chronic Obstructive Pulmonary Disease (COPD), Sleep Apnea,  Congestive Heart Failure, Deep Vein Thrombosis, Hypertension, Seizure Disorder] [N/A:N/A] Date Acquired: [2:11/18/2016] [3:11/18/2016] [N/A:N/A] Weeks of Treatment: [2:5] [3:5] [N/A:N/A] Wound Status: [2:Open] [3:Open] [N/A:N/A] Measurements L x W x D 1.6x14x0.1 [3:0.2x4.5x0.1] [N/A:N/A] (cm) Area (cm) : [2:17.593] [3:0.707] [N/A:N/A] Volume (cm) : [2:1.759] [3:0.071] [N/A:N/A] % Reduction in Area: [2:-198.70%] [3:74.30%] [N/A:N/A] % Reduction in Volume: -198.60% [3:74.20%] [N/A:N/A] Classification: [2:Partial Thickness] [3:Partial Thickness] [N/A:N/A] Exudate Amount: [2:Large] [3:Large] [N/A:N/A] Exudate Type: [2:Serosanguineous] [3:Serosanguineous] [N/A:N/A] Exudate Color: [2:red, brown] [3:red, brown] [N/A:N/A] Wound Margin: [2:Distinct, outline attached] [3:Distinct, outline attached] [N/A:N/A] Granulation Amount: [2:Medium (34-66%)] [3:Medium  (34-66%)] [N/A:N/A] Granulation Quality: [2:Red] [3:Red] [N/A:N/A] Necrotic Amount: [2:Medium (34-66%)] [3:Medium (34-66%)] [N/A:N/A] Exposed Structures: Fascia: No N/A N/A Fat Layer (Subcutaneous Tissue) Exposed: No Tendon: No Muscle: No Joint: No Bone: No Limited to Skin Breakdown Epithelialization: None Large (67-100%) N/A Periwound Skin Texture: No Abnormalities Noted No Abnormalities Noted N/A Periwound Skin No Abnormalities Noted No Abnormalities Noted N/A Moisture: Periwound Skin Color: No Abnormalities Noted No Abnormalities Noted N/A Temperature: No Abnormality No Abnormality N/A Tenderness on Yes Yes N/A Palpation: Wound Preparation: Ulcer Cleansing: Ulcer Cleansing: N/A Rinsed/Irrigated with Rinsed/Irrigated with Saline Saline Topical Anesthetic Topical Anesthetic Applied: Other: lidocaine Applied: Other: lidocaine 4% 4% Treatment Notes Wound #2 (Right, Proximal, Anterior Lower Leg) 1. Cleansed with: Clean wound with Normal Saline 2. Anesthetic Topical Lidocaine 4% cream to wound bed prior to debridement 3. Peri-wound Care: Moisturizing lotion 4. Dressing Applied: Hydrafera Blue 5. Secondary Dressing Applied ABD Pad 7. Secured with Tape Notes kerlix, coban, steri-strips Wound #3 (Right, Distal Lower Leg) 5. Secondary Dressing Applied ABD Pad 7. Secured with Tape Cregan, Kylie L. ([id number]) Notes kerlix, coban, steri-strips Electronic Signature(s) Signed: 12/30/2016 8:55:22 AM By: Evlyn Kanner MD, FACS Entered By: Evlyn Kanner on 12/30/2016 08:55:22 Kronberg, [name] ([id number]) -------------------------------------------------------------------------------- Multi-Disciplinary Care Plan Details Patient Name: [name] Date of Service: 12/30/2016 8:00 AM Medical Record Number: [id number] Patient Account Number: [id number] Date of Birth/Sex: 12-Feb-1941 (76 y.o. Male) Treating RN: Curtis Sites Primary Care Ananth Fiallos: Karlene Einstein Other Clinician: Referring Aleta Manternach: Daryel November Treating Ronen Bromwell/Extender: Rudene Re in Treatment: 5 Active Inactive ` Abuse / Safety / Falls / Self Care Management Nursing Diagnoses: Potential for falls Goals: Patient will remain injury free Date Initiated: 11/22/2016 Target Resolution Date: 02/05/2017 Goal Status: Active Interventions: Assess fall risk on admission and as needed Assess impairment of mobility on admission and as needed per policy Notes: ` Nutrition Nursing Diagnoses: Imbalanced nutrition Potential for alteratiion in Nutrition/Potential for imbalanced nutrition Goals: Patient/caregiver agrees to and verbalizes understanding of need to use nutritional supplements and/or vitamins as prescribed Date Initiated: 11/22/2016 Target Resolution Date: 02/12/2017 Goal Status: Active Interventions: Assess patient nutrition upon admission and as needed per policy Provide education on nutrition Treatment Activities: Education provided on Nutrition : 11/22/2016 ELERY, CADENHEAD ([id number]) Notes: ` Orientation to the Wound Care Program Nursing Diagnoses: Knowledge deficit related to the wound healing center program Goals: Patient/caregiver will verbalize understanding of the Wound Healing Center Program Date Initiated: 11/22/2016 Target Resolution Date: 12/11/2016 Goal Status: Active Interventions: Provide education on orientation to the wound center Notes: ` Pain, Acute or Chronic Nursing Diagnoses: Pain, acute or chronic: actual or potential Potential alteration in comfort, pain Goals: Patient/caregiver will verbalize adequate pain control between visits Date Initiated: 11/22/2016 Target  Resolution Date: 03/12/2017 Goal Status: Active Interventions: Assess comfort goal upon admission Complete pain assessment as per visit requirements Notes: ` Wound/Skin Impairment Nursing Diagnoses: Impaired tissue integrity Knowledge deficit  related to ulceration/compromised skin integrity Goals: Ulcer/skin breakdown will have a volume reduction of 80% by week 12 Date Initiated: 11/22/2016 Target Resolution Date: 03/05/2017 Goal Status: Active [name] DarbyCRAWFORD, Kortez L. ([id number]) Interventions: Assess patient/caregiver ability to perform ulcer/skin care regimen upon admission and as needed Assess ulceration(s) every visit Notes: Electronic Signature(s) Signed: 12/30/2016 4:48:26 PM By: Curtis Sitesorthy, Joanna Entered By: Curtis Sitesorthy, Joanna on 12/30/2016 08:27:18 Gulley, [name] HutchingJERRY L. ([id number]) -------------------------------------------------------------------------------- Pain Assessment Details Patient Name: [name] DarbyRAWFORD, Kaynen L. Date of Service: 12/30/2016 8:00 AM Medical Record Number: [id number] Patient Account Number: [id number] Date of Birth/Sex: 1940-08-25 76(76 y.o. Male) Treating RN: Curtis Sitesorthy, Joanna Primary Care Amayrany Cafaro: Karlene EinsteinASANAYAKA, GAYANI Other Clinician: Referring Ilario Dhaliwal: Daryel NovemberWILLIAMS, JONATHAN Treating Iylah Dworkin/Extender: Rudene ReBritto, Errol Weeks in Treatment: 5 Active Problems Location of Pain Severity and Description of Pain Patient Has Paino Yes Site Locations Pain Location: Generalized Pain With Dressing Change: No Pain Management and Medication Current Pain Management: Electronic Signature(s) Signed: 12/30/2016 4:48:26 PM By: Curtis Sitesorthy, Joanna Entered By: Curtis Sitesorthy, Joanna on 12/30/2016 08:10:46 Diel, [name] HutchingJERRY L. ([id number]) -------------------------------------------------------------------------------- Patient/Caregiver Education Details Patient Name: [name] DarbyRAWFORD, Corky L. Date of Service: 12/30/2016 8:00 AM Medical Record Number: [id number] Patient Account Number: [id number] Date of Birth/Gender: 1940-08-25 [age] y.o. Male) Treating RN: Curtis Sitesorthy, Joanna Primary Care Physician: Karlene EinsteinASANAYAKA, GAYANI Other Clinician: Referring Physician: Daryel NovemberWILLIAMS, JONATHAN Treating Physician/Extender: Rudene ReBritto, Errol Weeks in Treatment: 5 Education  Assessment Education Provided To: Patient and Caregiver Education Topics Provided Venous: Handouts: Other: leg elevation Methods: Explain/Verbal Responses: State content correctly Electronic Signature(s) Signed: 12/30/2016 4:48:26 PM By: Curtis Sitesorthy, Joanna Entered By: Curtis Sitesorthy, Joanna on 12/30/2016 08:28:40 Mccay, [name] HutchingJERRY L. ([id number]) -------------------------------------------------------------------------------- Wound Assessment Details Patient Name: [name] DarbyRAWFORD, Elishah L. Date of Service: 12/30/2016 8:00 AM Medical Record Number: [id number] Patient Account Number: [id number] Date of Birth/Sex: 1940-08-25 [age] y.o. Male) Treating RN: Curtis Sitesorthy, Joanna Primary Care Bexton Haak: Karlene EinsteinASANAYAKA, GAYANI Other Clinician: Referring Denishia Citro: Daryel NovemberWILLIAMS, JONATHAN Treating Sharvi Mooneyhan/Extender: Rudene ReBritto, Errol Weeks in Treatment: 5 Wound Status Wound Number: 2 Primary Trauma, Other Etiology: Wound Location: Right Lower Leg - Anterior, Proximal Wound Open Status: Wounding Event: Trauma Comorbid Lymphedema, Chronic Obstructive Date Acquired: 11/18/2016 History: Pulmonary Disease (COPD), Sleep Weeks Of Treatment: 5 Apnea, Congestive Heart Failure, Deep Clustered Wound: No Vein Thrombosis, Hypertension, Seizure Disorder Photos Photo Uploaded By: Curtis Sitesorthy, Joanna on 12/30/2016 10:40:58 Wound Measurements Length: (cm) 1.6 Width: (cm) 14 Depth: (cm) 0.1 Area: (cm) 17.593 Volume: (cm) 1.759 % Reduction in Area: -198.7% % Reduction in Volume: -198.6% Epithelialization: None Tunneling: No Undermining: No Wound Description Classification: Partial Thickness Foul Odor Aft Wound Margin: Distinct, outline attached Slough/Fibrin Exudate Amount: Large Exudate Type: Serosanguineous Exudate Color: red, brown er Cleansing: No o Yes Wound Bed Granulation Amount: Medium (34-66%) Exposed Structure Granulation Quality: Red Fascia Exposed: No Railsback, Kaleem L. ([id number]) Necrotic Amount: Medium  (34-66%) Fat Layer (Subcutaneous Tissue) Exposed: No Necrotic Quality: Adherent Slough Tendon Exposed: No Muscle Exposed: No Joint Exposed: No Bone Exposed: No Limited to Skin Breakdown Periwound Skin Texture Texture Color No Abnormalities Noted: No No Abnormalities Noted: No Moisture Temperature / Pain No Abnormalities Noted: No Temperature: No Abnormality Tenderness on Palpation: Yes Wound Preparation Ulcer Cleansing: Rinsed/Irrigated with Saline Topical Anesthetic Applied: Other: lidocaine 4%, Treatment Notes Wound #2 (Right, Proximal, Anterior Lower Leg) 1. Cleansed with: Clean wound with Normal Saline 2. Anesthetic Topical Lidocaine 4% cream to wound bed prior to debridement 3. Peri-wound Care:  Moisturizing lotion 4. Dressing Applied: Hydrafera Blue 5. Secondary Dressing Applied ABD Pad 7. Secured with Tape Notes kerlix, coban, steri-strips Electronic Signature(s) Signed: 12/30/2016 4:48:26 PM By: Curtis Sites Entered By: Curtis Sites on 12/30/2016 08:23:22 Lucente, [name] ([id number]) -------------------------------------------------------------------------------- Wound Assessment Details Patient Name: [name] Date of Service: 12/30/2016 8:00 AM Medical Record Number: [id number] Patient Account Number: [id number] Date of Birth/Sex: [birth year] (76 y.o. Male) Treating RN: Curtis Sites Primary Care Sarahanne Novakowski: Karlene Einstein Other Clinician: Referring Saqib Cazarez: Daryel November Treating Layton Tappan/Extender: Rudene Re in Treatment: 5 Wound Status Wound Number: 3 Primary Trauma, Other Etiology: Wound Location: Right Lower Leg - Distal Wound Open Wounding Event: Trauma Status: Date Acquired: 11/18/2016 Comorbid Lymphedema, Chronic Obstructive Weeks Of Treatment: 5 History: Pulmonary Disease (COPD), Sleep Clustered Wound: No Apnea, Congestive Heart Failure, Deep Vein Thrombosis, Hypertension, Seizure Disorder Photos Photo  Uploaded By: Curtis Sites on 12/30/2016 10:40:58 Wound Measurements Length: (cm) 0.2 Width: (cm) 4.5 Depth: (cm) 0.1 Area: (cm) 0.707 Volume: (cm) 0.071 % Reduction in Area: 74.3% % Reduction in Volume: 74.2% Epithelialization: Large (67-100%) Tunneling: No Undermining: No Wound Description Classification: Partial Thickness Foul Odor Aft Wound Margin: Distinct, outline attached Slough/Fibrin Exudate Amount: Large Exudate Type: Serosanguineous Exudate Color: red, brown er Cleansing: No o No Wound Bed Granulation Amount: Medium (34-66%) Granulation Quality: Red Yager, Kenderick L. ([id number]) Necrotic Amount: Medium (34-66%) Periwound Skin Texture Texture Color No Abnormalities Noted: No No Abnormalities Noted: No Moisture Temperature / Pain No Abnormalities Noted: No Temperature: No Abnormality Tenderness on Palpation: Yes Wound Preparation Ulcer Cleansing: Rinsed/Irrigated with Saline Topical Anesthetic Applied: Other: lidocaine 4%, Treatment Notes Wound #3 (Right, Distal Lower Leg) 5. Secondary Dressing Applied ABD Pad 7. Secured with Tape Notes kerlix, coban, steri-strips Electronic Signature(s) Signed: 12/30/2016 4:48:26 PM By: Curtis Sites Entered By: Curtis Sites on 12/30/2016 08:27:08 Marciano, [name] ([id number]) -------------------------------------------------------------------------------- Vitals Details Patient Name: [name] Date of Service: 12/30/2016 8:00 AM Medical Record Number: [id number] Patient Account Number: [id number] Date of Birth/Sex: Sep 06, 1940 (76 y.o. Male) Treating RN: Curtis Sites Primary Care [name] Bodley: Karlene Einstein Other Clinician: Referring Lydian Chavous: Daryel November Treating Scottie Stanish/Extender: Rudene Re in Treatment: 5 Vital Signs Time Taken: 08:12 Pulse (bpm): 78 Height (in): 70 Respiratory Rate (breaths/min): 18 Weight (lbs): 253 Blood Pressure (mmHg): 103/56 Body Mass Index  (BMI): 36.3 Reference Range: 80 - 120 mg / dl Electronic Signature(s) Signed: 12/30/2016 4:48:26 PM By: Curtis Sites Entered By: Curtis Sites on 12/30/2016 08:12:55

## 2017-01-06 ENCOUNTER — Encounter: Payer: Medicare Other | Admitting: Surgery

## 2017-01-06 DIAGNOSIS — L97212 Non-pressure chronic ulcer of right calf with fat layer exposed: Secondary | ICD-10-CM | POA: Diagnosis not present

## 2017-01-07 NOTE — Progress Notes (Signed)
ZIGMOND, TRELA ([id number]) Visit Report for 01/06/2017 Arrival Information Details Patient Name: [name], [name] Date of Service: 01/06/2017 8:00 AM Medical Record Number: [id number] Patient Account Number: [id number] Date of Birth/Sex: 10-06-1940 (76 y.o. Male) Treating RN: Phillis Haggis Primary Care Suellen Durocher: Karlene Einstein Other Clinician: Referring Daryan Cagley: Daryel November Treating Ledon Weihe/Extender: Rudene Re in Treatment: 6 Visit Information History Since Last Visit All ordered tests and consults were completed: No Patient Arrived: Wheel Chair Added or deleted any medications: No Arrival Time: 08:04 Any new allergies or adverse reactions: No Accompanied By: caregiver Had a fall or experienced change in No activities of daily living that may affect Transfer Assistance: Michiel Sites Lift risk of falls: Patient Identification Verified: Yes Signs or symptoms of abuse/neglect since last No Secondary Verification Process Yes visito Completed: Hospitalized since last visit: No Patient Requires Transmission-Based No Has Dressing in Place as Prescribed: Yes Precautions: Pain Present Now: No Patient Has Alerts: No Electronic Signature(s) Signed: 01/06/2017 4:16:11 PM By: Alejandro Mulling Entered By: Alejandro Mulling on 01/06/2017 08:09:46 Mertens, [name] ([id number]) -------------------------------------------------------------------------------- Clinic Level of Care Assessment Details Patient Name: [name] Date of Service: 01/06/2017 8:00 AM Medical Record Number: [id number] Patient Account Number: [id number] Date of Birth/Sex: 1940/08/28 (76 y.o. Male) Treating RN: Ashok Cordia, Debi Primary Care Curry Seefeldt: Karlene Einstein Other Clinician: Referring Mearle Drew: Daryel November Treating Bryne Lindon/Extender: Rudene Re in Treatment: 6 Clinic Level of Care Assessment Items TOOL 4 Quantity Score X - Use when only an EandM is performed on  FOLLOW-UP visit 1 0 ASSESSMENTS - Nursing Assessment / Reassessment X - Reassessment of Co-morbidities (includes updates in patient status) 1 10 X - Reassessment of Adherence to Treatment Plan 1 5 ASSESSMENTS - Wound and Skin Assessment / Reassessment []  - Simple Wound Assessment / Reassessment - one wound 0 X - Complex Wound Assessment / Reassessment - multiple wounds 2 5 []  - Dermatologic / Skin Assessment (not related to wound area) 0 ASSESSMENTS - Focused Assessment []  - Circumferential Edema Measurements - multi extremities 0 []  - Nutritional Assessment / Counseling / Intervention 0 []  - Lower Extremity Assessment (monofilament, tuning fork, pulses) 0 []  - Peripheral Arterial Disease Assessment (using hand held doppler) 0 ASSESSMENTS - Ostomy and/or Continence Assessment and Care []  - Incontinence Assessment and Management 0 []  - Ostomy Care Assessment and Management (repouching, etc.) 0 PROCESS - Coordination of Care []  - Simple Patient / Family Education for ongoing care 0 X - Complex (extensive) Patient / Family Education for ongoing care 1 20 X - Staff obtains Chiropractor, Records, Test Results / Process Orders 1 10 X - Staff telephones HHA, Nursing Homes / Clarify orders / etc 1 10 []  - Routine Transfer to another Facility (non-emergent condition) 0 [name], [name] ([id number]) []  - Routine Hospital Admission (non-emergent condition) 0 []  - New Admissions / Manufacturing engineer / Ordering NPWT, Apligraf, etc. 0 []  - Emergency Hospital Admission (emergent condition) 0 X - Simple Discharge Coordination 1 10 []  - Complex (extensive) Discharge Coordination 0 PROCESS - Special Needs []  - Pediatric / Minor Patient Management 0 []  - Isolation Patient Management 0 []  - Hearing / Language / Visual special needs 0 []  - Assessment of Community assistance (transportation, D/C planning, etc.) 0 []  - Additional assistance / Altered mentation 0 []  - Support Surface(s) Assessment (bed,  cushion, seat, etc.) 0 INTERVENTIONS - Wound Cleansing / Measurement []  - Simple Wound Cleansing - one wound 0 X - Complex Wound Cleansing - multiple wounds 2 5 X -  Wound Imaging (photographs - any number of wounds) 1 5 []  - Wound Tracing (instead of photographs) 0 []  - Simple Wound Measurement - one wound 0 X - Complex Wound Measurement - multiple wounds 2 5 INTERVENTIONS - Wound Dressings []  - Small Wound Dressing one or multiple wounds 0 []  - Medium Wound Dressing one or multiple wounds 0 X - Large Wound Dressing one or multiple wounds 1 20 X - Application of Medications - topical 1 5 []  - Application of Medications - injection 0 INTERVENTIONS - Miscellaneous []  - External ear exam 0 [name], [name] ([id number]) []  - Specimen Collection (cultures, biopsies, blood, body fluids, etc.) 0 []  - Specimen(s) / Culture(s) sent or taken to Lab for analysis 0 []  - Patient Transfer (multiple staff / Michiel Sites Lift / Similar devices) 0 []  - Simple Staple / Suture removal (25 or less) 0 []  - Complex Staple / Suture removal (26 or more) 0 []  - Hypo / Hyperglycemic Management (close monitor of Blood Glucose) 0 []  - Ankle / Brachial Index (ABI) - do not check if billed separately 0 X - Vital Signs 1 5 Has the patient been seen at the hospital within the last three years: Yes Total Score: 130 Level Of Care: New/Established - Level 4 Electronic Signature(s) Signed: 01/06/2017 4:16:11 PM By: Alejandro Mulling Entered By: Alejandro Mulling on 01/06/2017 11:13:49 [name], [name] ([id number]) -------------------------------------------------------------------------------- Encounter Discharge Information Details Patient Name: [name] Date of Service: 01/06/2017 8:00 AM Medical Record Number: [id number] Patient Account Number: [id number] Date of Birth/Sex: Jul 31, 1941 (76 y.o. Male) Treating RN: Phillis Haggis Primary Care Verniece Encarnacion: Karlene Einstein Other Clinician: Referring Millenia Waldvogel:  Daryel November Treating Carlin Mamone/Extender: Rudene Re in Treatment: 6 Encounter Discharge Information Items Discharge Pain Level: 0 Discharge Condition: Stable Ambulatory Status: Wheelchair Discharge Destination: Nursing Home Transportation: Private Auto Accompanied By: caregiver Schedule Follow-up Appointment: Yes Medication Reconciliation completed and provided to Patient/Care No Ranbir Chew: Provided on Clinical Summary of Care: 01/06/2017 Form Type Recipient Paper Patient JC Electronic Signature(s) Signed: 01/06/2017 8:42:48 AM By: Gwenlyn Perking Entered By: Gwenlyn Perking on 01/06/2017 08:42:48 Ager, [name] ([id number]) -------------------------------------------------------------------------------- Lower Extremity Assessment Details Patient Name: [name] Date of Service: 01/06/2017 8:00 AM Medical Record Number: [id number] Patient Account Number: [id number] Date of Birth/Sex: 10-02-1940 (76 y.o. Male) Treating RN: Phillis Haggis Primary Care Maylin Freeburg: Karlene Einstein Other Clinician: Referring Jesi Jurgens: Daryel November Treating Tenille Morrill/Extender: Rudene Re in Treatment: 6 Vascular Assessment Pulses: Dorsalis Pedis Palpable: [Right:Yes] Posterior Tibial Extremity colors, hair growth, and conditions: Extremity Color: [Right:Hyperpigmented] Hair Growth on Extremity: [Right:No] Temperature of Extremity: [Right:Cool] Capillary Refill: [Right:< 3 seconds] Toe Nail Assessment Left: Right: Thick: Yes Discolored: Yes Deformed: Yes Improper Length and Hygiene: No Electronic Signature(s) Signed: 01/06/2017 4:16:11 PM By: Alejandro Mulling Entered By: Alejandro Mulling on 01/06/2017 08:21:29 Hollenbach, [name] ([id number]) -------------------------------------------------------------------------------- Multi Wound Chart Details Patient Name: [name] Date of Service: 01/06/2017 8:00 AM Medical Record Number:  [id number] Patient Account Number: [id number] Date of Birth/Sex: 17-Nov-1940 (76 y.o. Male) Treating RN: Phillis Haggis Primary Care Chaquita Basques: Karlene Einstein Other Clinician: Referring Dreshawn Hendershott: Daryel November Treating Jesenya Bowditch/Extender: Rudene Re in Treatment: 6 Vital Signs Height(in): 70 Pulse(bpm): 108 Weight(lbs): 253 Blood Pressure 119/71 (mmHg): Body Mass Index(BMI): 36 Temperature(F): 98.3 Respiratory Rate 18 (breaths/min): Photos: [N/A:N/A] Wound Location: Right Lower Leg - Right Lower Leg - Distal N/A Anterior, Proximal Wounding Event: Trauma Trauma N/A Primary Etiology: Trauma, Other Trauma, Other N/A Comorbid History: Lymphedema, Chronic Lymphedema, Chronic N/A Obstructive Pulmonary Obstructive  Pulmonary Disease (COPD), Sleep Disease (COPD), Sleep Apnea, Congestive Heart Apnea, Congestive Heart Failure, Deep Vein Failure, Deep Vein Thrombosis, Thrombosis, Hypertension, Seizure Hypertension, Seizure Disorder Disorder Date Acquired: 11/18/2016 11/18/2016 N/A Weeks of Treatment: 6 6 N/A Wound Status: Open Open N/A Measurements L x W x D 0.8x13.5x0.1 0.2x4x0.1 N/A (cm) Area (cm) : 8.482 0.628 N/A Volume (cm) : 0.848 0.063 N/A % Reduction in Area: -44.00% 77.20% N/A % Reduction in Volume: -44.00% 77.10% N/A Classification: Partial Thickness Partial Thickness N/A Exudate Amount: Large Medium N/A Bushart, Quayshaun L. ([id number]) Exudate Type: Serosanguineous Serosanguineous N/A Exudate Color: red, brown red, brown N/A Wound Margin: Distinct, outline attached Distinct, outline attached N/A Granulation Amount: Large (67-100%) Medium (34-66%) N/A Granulation Quality: Red Red N/A Necrotic Amount: Small (1-33%) Medium (34-66%) N/A Exposed Structures: Fascia: No N/A N/A Fat Layer (Subcutaneous Tissue) Exposed: No Tendon: No Muscle: No Joint: No Bone: No Limited to Skin Breakdown Epithelialization: Small (1-33%) Large (67-100%) N/A Periwound  Skin Texture: No Abnormalities Noted No Abnormalities Noted N/A Periwound Skin No Abnormalities Noted No Abnormalities Noted N/A Moisture: Periwound Skin Color: No Abnormalities Noted No Abnormalities Noted N/A Temperature: No Abnormality No Abnormality N/A Tenderness on Yes Yes N/A Palpation: Wound Preparation: Ulcer Cleansing: Ulcer Cleansing: N/A Rinsed/Irrigated with Rinsed/Irrigated with Saline Saline Topical Anesthetic Topical Anesthetic Applied: Other: lidocaine Applied: Other: lidocaine 4% 4% Treatment Notes Wound #2 (Right, Proximal, Anterior Lower Leg) 1. Cleansed with: Clean wound with Normal Saline 2. Anesthetic Topical Lidocaine 4% cream to wound bed prior to debridement 3. Peri-wound Care: Moisturizing lotion 4. Dressing Applied: Hydrafera Blue 5. Secondary Dressing Applied ABD Pad 7. Secured with Tape Notes kerlix, coban, steri-strips Towell, Cooper L. ([id number]) Wound #3 (Right, Distal Lower Leg) 1. Cleansed with: Clean wound with Normal Saline 3. Peri-wound Care: Moisturizing lotion 7. Secured with Tape Notes kerlix, coban, steri-strips Electronic Signature(s) Signed: 01/06/2017 9:28:32 AM By: Evlyn Kanner MD, FACS Entered By: Evlyn Kanner on 01/06/2017 09:28:32 Rho, [name] ([id number]) -------------------------------------------------------------------------------- Multi-Disciplinary Care Plan Details Patient Name: [name] Date of Service: 01/06/2017 8:00 AM Medical Record Number: [id number] Patient Account Number: [id number] Date of Birth/Sex: Jul 27, 1941 (76 y.o. Male) Treating RN: Phillis Haggis Primary Care Hanh Kertesz: Karlene Einstein Other Clinician: Referring Floyed Masoud: Daryel November Treating Deanza Upperman/Extender: Rudene Re in Treatment: 6 Active Inactive ` Abuse / Safety / Falls / Self Care Management Nursing Diagnoses: Potential for falls Goals: Patient will remain injury free Date Initiated:  11/22/2016 Target Resolution Date: 02/05/2017 Goal Status: Active Interventions: Assess fall risk on admission and as needed Assess impairment of mobility on admission and as needed per policy Notes: ` Nutrition Nursing Diagnoses: Imbalanced nutrition Potential for alteratiion in Nutrition/Potential for imbalanced nutrition Goals: Patient/caregiver agrees to and verbalizes understanding of need to use nutritional supplements and/or vitamins as prescribed Date Initiated: 11/22/2016 Target Resolution Date: 02/12/2017 Goal Status: Active Interventions: Assess patient nutrition upon admission and as needed per policy Provide education on nutrition Treatment Activities: Education provided on Nutrition : 11/22/2016 [name], [name] ([id number]) Notes: ` Orientation to the Wound Care Program Nursing Diagnoses: Knowledge deficit related to the wound healing center program Goals: Patient/caregiver will verbalize understanding of the Wound Healing Center Program Date Initiated: 11/22/2016 Target Resolution Date: 12/11/2016 Goal Status: Active Interventions: Provide education on orientation to the wound center Notes: ` Pain, Acute or Chronic Nursing Diagnoses: Pain, acute or chronic: actual or potential Potential alteration in comfort, pain Goals: Patient/caregiver will verbalize adequate pain control between visits Date Initiated: 11/22/2016 Target Resolution Date: 03/12/2017  Goal Status: Active Interventions: Assess comfort goal upon admission Complete pain assessment as per visit requirements Notes: ` Wound/Skin Impairment Nursing Diagnoses: Impaired tissue integrity Knowledge deficit related to ulceration/compromised skin integrity Goals: Ulcer/skin breakdown will have a volume reduction of 80% by week 12 Date Initiated: 11/22/2016 Target Resolution Date: 03/05/2017 Goal Status: Active [name], [name] ([id number]) Interventions: Assess patient/caregiver ability to perform  ulcer/skin care regimen upon admission and as needed Assess ulceration(s) every visit Notes: Electronic Signature(s) Signed: 01/06/2017 4:16:11 PM By: Alejandro Mulling Entered By: Alejandro Mulling on 01/06/2017 08:21:34 Milliner, [name] ([id number]) -------------------------------------------------------------------------------- Pain Assessment Details Patient Name: [name] Date of Service: 01/06/2017 8:00 AM Medical Record Number: [id number] Patient Account Number: [id number] Date of Birth/Sex: 1941-06-27 (76 y.o. Male) Treating RN: Phillis Haggis Primary Care Erique Kaser: Karlene Einstein Other Clinician: Referring Monicka Cyran: Daryel November Treating Kristjan Derner/Extender: Rudene Re in Treatment: 6 Active Problems Location of Pain Severity and Description of Pain Patient Has Paino No Site Locations With Dressing Change: No Pain Management and Medication Current Pain Management: Electronic Signature(s) Signed: 01/06/2017 4:16:11 PM By: Alejandro Mulling Entered By: Alejandro Mulling on 01/06/2017 08:09:53 Faiella, [name] ([id number]) -------------------------------------------------------------------------------- Patient/Caregiver Education Details Patient Name: [name] Date of Service: 01/06/2017 8:00 AM Medical Record Number: [id number] Patient Account Number: [id number] Date of Birth/Gender: [birth year] (76 y.o. Male) Treating RN: Phillis Haggis Primary Care Physician: Karlene Einstein Other Clinician: Referring Physician: Daryel November Treating Physician/Extender: Rudene Re in Treatment: 6 Education Assessment Education Provided To: Patient and Caregiver Education Topics Provided Wound/Skin Impairment: Handouts: Other: change dressing as ordered Methods: Demonstration, Explain/Verbal Responses: State content correctly Electronic Signature(s) Signed: 01/06/2017 4:16:11 PM By: Alejandro Mulling Entered By: Alejandro Mulling  on 01/06/2017 08:29:36 [name], [name] ([id number]) -------------------------------------------------------------------------------- Wound Assessment Details Patient Name: [name] Date of Service: 01/06/2017 8:00 AM Medical Record Number: [id number] Patient Account Number: [id number] Date of Birth/Sex: May 18, 1941 (76 y.o. Male) Treating RN: Ashok Cordia, Debi Primary Care Musa Rewerts: Karlene Einstein Other Clinician: Referring Elfie Costanza: Daryel November Treating Nalin Mazzocco/Extender: Rudene Re in Treatment: 6 Wound Status Wound Number: 2 Primary Trauma, Other Etiology: Wound Location: Right Lower Leg - Anterior, Proximal Wound Open Status: Wounding Event: Trauma Comorbid Lymphedema, Chronic Obstructive Date Acquired: 11/18/2016 History: Pulmonary Disease (COPD), Sleep Weeks Of Treatment: 6 Apnea, Congestive Heart Failure, Deep Clustered Wound: No Vein Thrombosis, Hypertension, Seizure Disorder Photos Photo Uploaded By: Alejandro Mulling on 01/06/2017 08:47:55 Wound Measurements Length: (cm) 0.8 Width: (cm) 13.5 Depth: (cm) 0.1 Area: (cm) 8.482 Volume: (cm) 0.848 % Reduction in Area: -44% % Reduction in Volume: -44% Epithelialization: Small (1-33%) Tunneling: No Undermining: No Wound Description Classification: Partial Thickness Foul Odor Aft Wound Margin: Distinct, outline attached Slough/Fibrin Exudate Amount: Large Exudate Type: Serosanguineous Exudate Color: red, brown er Cleansing: No o Yes Wound Bed Granulation Amount: Large (67-100%) Exposed Structure Granulation Quality: Red Fascia Exposed: No Bailly, Brailon L. ([id number]) Necrotic Amount: Small (1-33%) Fat Layer (Subcutaneous Tissue) Exposed: No Necrotic Quality: Adherent Slough Tendon Exposed: No Muscle Exposed: No Joint Exposed: No Bone Exposed: No Limited to Skin Breakdown Periwound Skin Texture Texture Color No Abnormalities Noted: No No Abnormalities Noted: No Moisture  Temperature / Pain No Abnormalities Noted: No Temperature: No Abnormality Tenderness on Palpation: Yes Wound Preparation Ulcer Cleansing: Rinsed/Irrigated with Saline Topical Anesthetic Applied: Other: lidocaine 4%, Treatment Notes Wound #2 (Right, Proximal, Anterior Lower Leg) 1. Cleansed with: Clean wound with Normal Saline 2. Anesthetic Topical Lidocaine 4% cream to wound bed prior to debridement 3. Peri-wound Care: Moisturizing lotion  4. Dressing Applied: Hydrafera Blue 5. Secondary Dressing Applied ABD Pad 7. Secured with Tape Notes kerlix, coban, steri-strips Electronic Signature(s) Signed: 01/06/2017 4:16:11 PM By: Alejandro MullingPinkerton, Debra Entered By: Alejandro MullingPinkerton, Debra on 01/06/2017 08:19:32 [name], [name] HutchingJERRY L. ([id number]) -------------------------------------------------------------------------------- Wound Assessment Details Patient Name: [name] DarbyRAWFORD, Asaph L. Date of Service: 01/06/2017 8:00 AM Medical Record Number: [id number] Patient Account Number: [id number] Date of Birth/Sex: [birth year] [age] y.o. Male) Treating RN: Phillis HaggisPinkerton, Debi Primary Care Alycen Mack: Karlene EinsteinASANAYAKA, GAYANI Other Clinician: Referring Shanyce [name]: Daryel NovemberWILLIAMS, JONATHAN Treating Aadi Bordner/Extender: Rudene ReBritto, Errol Weeks in Treatment: 6 Wound Status Wound Number: 3 Primary Trauma, Other Etiology: Wound Location: Right Lower Leg - Distal Wound Open Wounding Event: Trauma Status: Date Acquired: 11/18/2016 Comorbid Lymphedema, Chronic Obstructive Weeks Of Treatment: 6 History: Pulmonary Disease (COPD), Sleep Clustered Wound: No Apnea, Congestive Heart Failure, Deep Vein Thrombosis, Hypertension, Seizure Disorder Photos Photo Uploaded By: Alejandro MullingPinkerton, Debra on 01/06/2017 08:47:56 Wound Measurements Length: (cm) 0.2 Width: (cm) 4 Depth: (cm) 0.1 Area: (cm) 0.628 Volume: (cm) 0.063 % Reduction in Area: 77.2% % Reduction in Volume: 77.1% Epithelialization: Large (67-100%) Tunneling: No Undermining:  No Wound Description Classification: Partial Thickness Foul Odor Aft Wound Margin: Distinct, outline attached Slough/Fibrin Exudate Amount: Medium Exudate Type: Serosanguineous Exudate Color: red, brown er Cleansing: No o No Wound Bed Granulation Amount: Medium (34-66%) Granulation Quality: Red Enloe, Hassel L. ([id number]) Necrotic Amount: Medium (34-66%) Periwound Skin Texture Texture Color No Abnormalities Noted: No No Abnormalities Noted: No Moisture Temperature / Pain No Abnormalities Noted: No Temperature: No Abnormality Tenderness on Palpation: Yes Wound Preparation Ulcer Cleansing: Rinsed/Irrigated with Saline Topical Anesthetic Applied: Other: lidocaine 4%, Treatment Notes Wound #3 (Right, Distal Lower Leg) 1. Cleansed with: Clean wound with Normal Saline 3. Peri-wound Care: Moisturizing lotion 7. Secured with Tape Notes kerlix, coban, steri-strips Electronic Signature(s) Signed: 01/06/2017 4:16:11 PM By: Alejandro MullingPinkerton, Debra Entered By: Alejandro MullingPinkerton, Debra on 01/06/2017 08:20:10 Tuohey, [name] HutchingJERRY L. ([id number]) -------------------------------------------------------------------------------- Vitals Details Patient Name: [name] DarbyRAWFORD, Shant L. Date of Service: 01/06/2017 8:00 AM Medical Record Number: [id number] Patient Account Number: [id number] Date of Birth/Sex: [birth year] [age] y.o. Male) Treating RN: Phillis HaggisPinkerton, Debi Primary Care Satonya Lux: Karlene EinsteinASANAYAKA, GAYANI Other Clinician: Referring Reyden Smith: Daryel NovemberWILLIAMS, JONATHAN Treating Emogene Muratalla/Extender: Rudene ReBritto, Errol Weeks in Treatment: 6 Vital Signs Time Taken: 08:09 Temperature (F): 98.3 Height (in): 70 Pulse (bpm): 108 Weight (lbs): 253 Respiratory Rate (breaths/min): 18 Body Mass Index (BMI): 36.3 Blood Pressure (mmHg): 119/71 Reference Range: 80 - 120 mg / dl Electronic Signature(s) Signed: 01/06/2017 4:16:11 PM By: Alejandro MullingPinkerton, Debra Entered By: Alejandro MullingPinkerton, Debra on 01/06/2017 08:10:12

## 2017-01-08 NOTE — Progress Notes (Signed)
[name], [name] ([id number]) Visit Report for 01/06/2017 Chief Complaint Document Details Patient Name: [name], [name] Date of Service: 01/06/2017 8:00 AM Medical Record Number: [id number] Patient Account Number: [id number] Date of Birth/Sex: 1940/11/02 (76 y.o. Male) Treating RN: Phillis Haggis Primary Care Provider: Karlene Einstein Other Clinician: Referring Provider: Daryel November Treating Provider/Extender: Rudene Re in Treatment: 6 Information Obtained from: Patient Chief Complaint Patient presents to the wound care center for a consult due non healing wound. The non-healing wound of the right lower extremity which has been present for 4 days. Electronic Signature(s) Signed: 01/06/2017 9:28:40 AM By: Evlyn Kanner MD, FACS Entered By: Evlyn Kanner on 01/06/2017 09:28:39 [name], [name] [name] ([id number]) -------------------------------------------------------------------------------- HPI Details Patient Name: [name] Date of Service: 01/06/2017 8:00 AM Medical Record Number: [id number] Patient Account Number: [id number] Date of Birth/Sex: 16-Jun-1941 (76 y.o. Male) Treating RN: Phillis Haggis Primary Care Provider: Karlene Einstein Other Clinician: Referring Provider: Daryel November Treating Provider/Extender: Rudene Re in Treatment: 6 History of Present Illness Location: right lower extremity wound Quality: Patient reports experiencing a dull pain to affected area(s). Severity: Patient states wound are getting worse. Duration: Patient has had the wound for 4 days prior to presenting for treatment Timing: Pain in wound is Intermittent (comes and goes Context: The wound occurred when the patient had a fall and cut his leg which required several sutures and staples. Modifying Factors: Consults to this date include:he has had bilateral lower limb lymphedema and has been using lymphedema pumps. Associated Signs and Symptoms: Patient  reports having difficulty standing for long periods. HPI Description: 76 year old male was recently seen in the ER on 11/18/16, due to a fall and had a large right lower leg laceration. past medical history is significant for bipolar disorder, CHF, COPD, DVT, schizophrenia and seizure disorder. In the past he has also been treated by the vascular surgeons for lymphedema and was recommended compression stockings and lymphedema pump. In the ER a right leg x-ray showed soft tissue swelling without any acute bony abnormality and there was demineralization with degenerative changes of the right knee. he had a repair of his wound and his anticoagulation was held for 24 hours. He was asked to follow-up with the wound care department. ER notes the length of the wound was 30 cm and the depth was 10 mm. 3-0 nylon horizontal mattress sutures and multiple staples were used to close the wound. he was prescribed Keflex by the ER. Of note the patient does not have a follow-up appointment at the ER and his care has been transferred over to our wound center 12/06/2016 -- his caregiver tells me that he was treated for her diarrhea with ciprofloxacin and probiotics and this was put on by his PCP after an appropriate stool culture was reviewed. 12/30/2016 -- he was recently admitted to the hospital between 12/23/2016 and 12/25/2016 for proctocolitis, urinary tract infection with hematuria and syncope. a CT confirmed proctocolitis and he was given ciprofloxacin for 6 days. He also was given supportive care for his other ailments. The dressing he had on his right lower extremity was not done in the proper manner and had significant pedal edema and edema in the area below his knee ======== Old notes: 76 year old gentleman who has a history of seizures and schizophrenia with bipolar disorder enhances in a assisted living facility. He also has CHF, COPD, GERD and remote history of DVT. He has been treated by the  vascular group at [facility] for bilateral lymphedema and has  been using lymphedema pumps. This problem started on September 30 when he had a lacerated wound after a fall. The staples were left in for about 10 days and when the staples were removed the wound opened out. Since then he's had several Casa, Hideo L. ([id number]) issues with wound infection and the most recent culture showed a heavy growth of Pseudomonas aeruginosa and Staphylococcus aureus and he has received clindamycin and the most recent past and from today he is on levofloxacin. Addendum:Notes were reviewed from a previous vascular consult at [facility] by Dr. Gilda Crease who saw him on 05/27/2014. He had recommended graduated compression stockings class I and also added lymph pumps to improve the control of the patient's lymphedema. 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 Diffuse soft-tissue swelling correlate with bone scan recommended. 07/15/2015 -- he is still awaiting insurance clearance for his MRI and other than that he has no other changes. 07/21/2015 -- he now has the insurance clearance and his MRI has been scheduled for December 28th. 08/11/2014 -- the MRI of his left lower extremity showed there was no bone destruction or periosteal reaction and there was a soft tissue wound along the lateral aspect of the distal left lower leg with skin thickening but there was no drainable fluid collection to suggest an abscess. ======= Electronic Signature(s) Signed: 01/06/2017 9:28:48 AM By: Evlyn Kanner MD, FACS Entered By: Evlyn Kanner on 01/06/2017 09:28:48 [name], [name] [name] ([id number]) -------------------------------------------------------------------------------- Physical Exam Details Patient Name: [name] Date of Service: 01/06/2017 8:00 AM Medical Record Number: [id number] Patient  Account Number: [id number] Date of Birth/Sex: Mar 26, 1941 (76 y.o. Male) Treating RN: Phillis Haggis Primary Care Provider: Karlene Einstein Other Clinician: Referring Provider: Daryel November Treating Provider/Extender: Rudene Re in Treatment: 6 Constitutional . Pulse regular. Respirations normal and unlabored. Afebrile. . Eyes Nonicteric. Reactive to light. Ears, Nose, Mouth, and Throat Lips, teeth, and gums WNL.Marland Kitchen Moist mucosa without lesions. Neck supple and nontender. No palpable supraclavicular or cervical adenopathy. Normal sized without goiter. Respiratory WNL. No retractions.. Cardiovascular Pedal Pulses WNL. No clubbing, cyanosis or edema. Lymphatic No adneopathy. No adenopathy. No adenopathy. Musculoskeletal Adexa without tenderness or enlargement.. Digits and nails w/o clubbing, cyanosis, infection, petechiae, ischemia, or inflammatory conditions.. Integumentary (Hair, Skin) No suspicious lesions. No crepitus or fluctuance. No peri-wound warmth or erythema. No masses.Marland Kitchen Psychiatric Judgement and insight Intact.. No evidence of depression, anxiety, or agitation.. Notes the patient's wound is looking very good and healthy and the Steri-Strips are still in place. The lymphedema has gone down nicely and there is no debridement required today. Electronic Signature(s) Signed: 01/06/2017 9:30:03 AM By: Evlyn Kanner MD, FACS Entered By: Evlyn Kanner on 01/06/2017 09:30:02 [name], [name] [name] ([id number]) -------------------------------------------------------------------------------- Physician Orders Details Patient Name: [name] Date of Service: 01/06/2017 8:00 AM Medical Record Number: [id number] Patient Account Number: [id number] Date of Birth/Sex: 08-27-1940 (76 y.o. Male) Treating RN: Phillis Haggis Primary Care Provider: Karlene Einstein Other Clinician: Referring Provider: Daryel November Treating Provider/Extender: Rudene Re in Treatment: 6 Verbal / Phone Orders: Yes ClinicianAshok Cordia, Debi Read Back and Verified: Yes Diagnosis Coding Wound Cleansing Wound #2 Right,Proximal,Anterior Lower Leg o Clean wound with Normal Saline. Wound #3 Right,Distal Lower Leg o Clean wound with Normal Saline. Anesthetic Wound #2 Right,Proximal,Anterior Lower Leg o Topical Lidocaine 4% cream applied to wound bed prior to debridement - for clinic use Wound #3 Right,Distal Lower Leg o Topical  Lidocaine 4% cream applied to wound bed prior to debridement - for clinic use Skin Barriers/Peri-Wound Care o Moisturizing lotion - not on wounds Primary Wound Dressing Wound #2 Right,Proximal,Anterior Lower Leg o Hydrafera Blue - ready transfer o Other: - steri-strips (DO NOT REMOVE) Wound #3 Right,Distal Lower Leg o Other: - steri-strips (DO NOT REMOVE) Secondary Dressing Wound #2 Right,Proximal,Anterior Lower Leg o ABD pad Wound #3 Right,Distal Lower Leg o ABD pad Dressing Change Frequency Wound #2 Right,Proximal,Anterior Lower Leg o Three times weekly - HHRN to change on Wednesdays and Fridays and pt to come have it changed at wound care center on Mondays. RONDA, KAZMI ([id number]) Wound #3 Right,Distal Lower Leg o Three times weekly - HHRN to change on Wednesdays and Fridays and pt to come have it changed at wound care center on Mondays. Follow-up Appointments Wound #2 Right,Proximal,Anterior Lower Leg o Return Appointment in 1 week. Wound #3 Right,Distal Lower Leg o Return Appointment in 1 week. Edema Control Wound #2 Right,Proximal,Anterior Lower Leg o Kerlix and Coban - Right Lower Extremity - wrap 3cm from knee and 3cm from toes please follow this o Elevate legs to the level of the heart and pump ankles as often as possible Wound #3 Right,Distal Lower Leg o Kerlix and Coban - Right Lower Extremity - wrap 3cm from knee and 3cm from toes please follow this o  Elevate legs to the level of the heart and pump ankles as often as possible o Compression Pump: Use compression pump on left lower extremity for 30 minutes, twice daily. o Compression Pump: Use compression pump on right lower extremity for 30 minutes, twice daily. Additional Orders / Instructions Wound #2 Right,Proximal,Anterior Lower Leg o Increase protein intake. Wound #3 Right,Distal Lower Leg o Increase protein intake. Home Health Wound #2 Right,Proximal,Anterior Lower Leg o Continue [facility] Visits - [facility] o Home Health Nurse may visit PRN to address patientos wound care needs. o FACE TO FACE ENCOUNTER: MEDICARE and MEDICAID PATIENTS: I certify that this patient is under my care and that I had a face-to-face encounter that meets the physician face-to-face encounter requirements with this patient on this date. The encounter with the patient was in whole or in part for the following MEDICAL CONDITION: (primary reason for Home Healthcare) MEDICAL NECESSITY: I certify, that based on my findings, NURSING services are a medically necessary home health service. HOME BOUND STATUS: I certify that my clinical findings support that this patient is homebound (i.e., Due to illness or injury, pt requires aid of supportive devices such as crutches, cane, wheelchairs, walkers, the use of special transportation or the assistance of another person to leave their place of residence. There is a normal inability to leave the home and doing so requires considerable and taxing effort. Other Arviso, Lamaj L. ([id number]) absences are for medical reasons / religious services and are infrequent or of short duration when for other reasons). o If current dressing causes regression in wound condition, may D/C ordered dressing product/s and apply Normal Saline Moist Dressing daily until next Wound Healing Center / Other MD appointment. Notify Wound Healing Center of regression in wound  condition at [phone]. o Please direct any NON-WOUND related issues/requests for orders to patient's Primary Care Physician Wound #3 Right,Distal Lower Leg o Continue [facility] Visits - [facility] o Home Health Nurse may visit PRN to address patientos wound care needs. o FACE TO FACE ENCOUNTER: MEDICARE and MEDICAID PATIENTS: I certify that this patient is under my care and that I  had a face-to-face encounter that meets the physician face-to-face encounter requirements with this patient on this date. The encounter with the patient was in whole or in part for the following MEDICAL CONDITION: (primary reason for Home Healthcare) MEDICAL NECESSITY: I certify, that based on my findings, NURSING services are a medically necessary home health service. HOME BOUND STATUS: I certify that my clinical findings support that this patient is homebound (i.e., Due to illness or injury, pt requires aid of supportive devices such as crutches, cane, wheelchairs, walkers, the use of special transportation or the assistance of another person to leave their place of residence. There is a normal inability to leave the home and doing so requires considerable and taxing effort. Other absences are for medical reasons / religious services and are infrequent or of short duration when for other reasons). o If current dressing causes regression in wound condition, may D/C ordered dressing product/s and apply Normal Saline Moist Dressing daily until next Wound Healing Center / Other MD appointment. Notify Wound Healing Center of regression in wound condition at [phone]. o Please direct any NON-WOUND related issues/requests for orders to patient's Primary Care Physician Medications-please add to medication list. Wound #2 Right,Proximal,Anterior Lower Leg o Other: - Vitamin C, Zinc, Multivitamin Wound #3 Right,Distal Lower Leg o Other: - Vitamin C, Zinc, Multivitamin Electronic  Signature(s) Signed: 01/06/2017 4:34:04 PM By: Evlyn Kanner MD, FACS Entered By: Evlyn Kanner on 01/06/2017 09:30:33 [name], [name] [name] ([id number]) -------------------------------------------------------------------------------- Problem List Details Patient Name: [name] Date of Service: 01/06/2017 8:00 AM Medical Record Number: [id number] Patient Account Number: [id number] Date of Birth/Sex: 11-06-1940 (76 y.o. Male) Treating RN: Phillis Haggis Primary Care Provider: Karlene Einstein Other Clinician: Referring Provider: Daryel November Treating Provider/Extender: Rudene Re in Treatment: 6 Active Problems ICD-10 Encounter Code Description Active Date Diagnosis I89.0 Lymphedema, not elsewhere classified 11/22/2016 Yes I50.9 Heart failure, unspecified 11/22/2016 Yes S81.811A Laceration without foreign body, right lower leg, initial 11/22/2016 Yes encounter L97.212 Non-pressure chronic ulcer of right calf with fat layer 11/22/2016 Yes exposed Inactive Problems Resolved Problems Electronic Signature(s) Signed: 01/06/2017 9:28:24 AM By: Evlyn Kanner MD, FACS Entered By: Evlyn Kanner on 01/06/2017 86:57:84 [name], [name] [name] ([id number]) -------------------------------------------------------------------------------- Progress Note Details Patient Name: [name] Date of Service: 01/06/2017 8:00 AM Medical Record Number: [id number] Patient Account Number: [id number] Date of Birth/Sex: 15-Jan-1941 (76 y.o. Male) Treating RN: Phillis Haggis Primary Care Provider: Karlene Einstein Other Clinician: Referring Provider: Daryel November Treating Provider/Extender: Rudene Re in Treatment: 6 Subjective Chief Complaint Information obtained from Patient Patient presents to the wound care center for a consult due non healing wound. The non-healing wound of the right lower extremity which has been present for 4 days. History of Present Illness  (HPI) The following HPI elements were documented for the patient's wound: Location: right lower extremity wound Quality: Patient reports experiencing a dull pain to affected area(s). Severity: Patient states wound are getting worse. Duration: Patient has had the wound for 4 days prior to presenting for treatment Timing: Pain in wound is Intermittent (comes and goes Context: The wound occurred when the patient had a fall and cut his leg which required several sutures and staples. Modifying Factors: Consults to this date include:he has had bilateral lower limb lymphedema and has been using lymphedema pumps. Associated Signs and Symptoms: Patient reports having difficulty standing for long periods. 76 year old male was recently seen in the ER on 11/18/16, due to a fall and had a large right lower leg laceration. past  medical history is significant for bipolar disorder, CHF, COPD, DVT, schizophrenia and seizure disorder. In the past he has also been treated by the vascular surgeons for lymphedema and was recommended compression stockings and lymphedema pump. In the ER a right leg x-ray showed soft tissue swelling without any acute bony abnormality and there was demineralization with degenerative changes of the right knee. he had a repair of his wound and his anticoagulation was held for 24 hours. He was asked to follow-up with the wound care department. ER notes the length of the wound was 30 cm and the depth was 10 mm. 3-0 nylon horizontal mattress sutures and multiple staples were used to close the wound. he was prescribed Keflex by the ER. Of note the patient does not have a follow-up appointment at the ER and his care has been transferred over to our wound center 12/06/2016 -- his caregiver tells me that he was treated for her diarrhea with ciprofloxacin and probiotics and this was put on by his PCP after an appropriate stool culture was reviewed. 12/30/2016 -- he was recently admitted to the  hospital between 12/23/2016 and 12/25/2016 for proctocolitis, urinary tract infection with hematuria and syncope. a CT confirmed proctocolitis and he was given ciprofloxacin for 6 days. He also was given supportive care for his other ailments. The dressing he had on his right lower extremity was not done in the proper manner and had significant pedal edema and edema in the area below his knee [name], [name] ([id number]) ======== Old notes: 76 year old gentleman who has a history of seizures and schizophrenia with bipolar disorder enhances in a assisted living facility. He also has CHF, COPD, GERD and remote history of DVT. He has been treated by the vascular group at [facility] for bilateral lymphedema and has been using lymphedema pumps. This problem started on September 30 when he had a lacerated wound after a fall. The staples were left in for about 10 days and when the staples were removed the wound opened out. Since then he's had several issues with wound infection and the most recent culture showed a heavy growth of Pseudomonas aeruginosa and Staphylococcus aureus and he has received clindamycin and the most recent past and from today he is on levofloxacin. Addendum:Notes were reviewed from a previous vascular consult at [facility] by Dr. Gilda Crease who saw him on 05/27/2014. He had recommended graduated compression stockings class I and also added lymph pumps to improve the control of the patient's lymphedema. 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 Diffuse soft-tissue swelling correlate with bone scan recommended. 07/15/2015 -- he is still awaiting insurance clearance for his MRI and other than that he has no other changes. 07/21/2015 -- he now has the insurance clearance and his MRI has been scheduled for December 28th. 08/11/2014 -- the MRI of his left lower  extremity showed there was no bone destruction or periosteal reaction and there was a soft tissue wound along the lateral aspect of the distal left lower leg with skin thickening but there was no drainable fluid collection to suggest an abscess. ======= Objective Constitutional Pulse regular. Respirations normal and unlabored. Afebrile. Vitals Time Taken: 8:09 AM, Height: 70 in, Weight: 253 lbs, BMI: 36.3, Temperature: 98.3 F, Pulse: 108 bpm, Respiratory Rate: 18 breaths/min, Blood Pressure: 119/71 mmHg. Eyes Nonicteric. Reactive to light. Ears, Nose, Mouth, and Throat Lips, teeth, and gums WNL.Marland Kitchen Moist mucosa without lesions.  Neck supple and nontender. No palpable supraclavicular or cervical adenopathy. Normal sized without goiter. Peer, Kion L. ([id number]) Respiratory WNL. No retractions.. Cardiovascular Pedal Pulses WNL. No clubbing, cyanosis or edema. Lymphatic No adneopathy. No adenopathy. No adenopathy. Musculoskeletal Adexa without tenderness or enlargement.. Digits and nails w/o clubbing, cyanosis, infection, petechiae, ischemia, or inflammatory conditions.Marland Kitchen. Psychiatric Judgement and insight Intact.. No evidence of depression, anxiety, or agitation.. General Notes: the patient's wound is looking very good and healthy and the Steri-Strips are still in place. The lymphedema has gone down nicely and there is no debridement required today. Integumentary (Hair, Skin) No suspicious lesions. No crepitus or fluctuance. No peri-wound warmth or erythema. No masses.. Wound #2 status is Open. Original cause of wound was Trauma. The wound is located on the Right,Proximal,Anterior Lower Leg. The wound measures 0.8cm length x 13.5cm width x 0.1cm depth; 8.482cm^2 area and 0.848cm^3 volume. The wound is limited to skin breakdown. There is no tunneling or undermining noted. There is a large amount of serosanguineous drainage noted. The wound margin is distinct with the outline  attached to the wound base. There is large (67-100%) red granulation within the wound bed. There is a small (1-33%) amount of necrotic tissue within the wound bed including Adherent Slough. Periwound temperature was noted as No Abnormality. The periwound has tenderness on palpation. Wound #3 status is Open. Original cause of wound was Trauma. The wound is located on the Right,Distal Lower Leg. The wound measures 0.2cm length x 4cm width x 0.1cm depth; 0.628cm^2 area and 0.063cm^3 volume. There is no tunneling or undermining noted. There is a medium amount of serosanguineous drainage noted. The wound margin is distinct with the outline attached to the wound base. There is medium (34-66%) red granulation within the wound bed. There is a medium (34-66%) amount of necrotic tissue within the wound bed. Periwound temperature was noted as No Abnormality. The periwound has tenderness on palpation. Assessment Active Problems ICD-10 I89.0 - Lymphedema, not elsewhere classified I50.9 - Heart failure, unspecified S81.811A - Laceration without foreign body, right lower leg, initial encounter [name], [name] 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 Wound #3 Right,Distal Lower Leg: Clean wound with Normal Saline. Anesthetic: Wound #2 Right,Proximal,Anterior Lower Leg: Topical Lidocaine 4% cream applied to wound bed prior to debridement - for clinic use Wound #3 Right,Distal Lower Leg: Topical Lidocaine 4% cream applied to wound bed prior to debridement - for clinic use Skin Barriers/Peri-Wound Care: Moisturizing lotion - not on wounds Primary Wound Dressing: Wound #2 Right,Proximal,Anterior Lower Leg: Hydrafera Blue - ready transfer Other: - steri-strips (DO NOT REMOVE) Wound #3 Right,Distal Lower Leg: Other: - steri-strips (DO NOT REMOVE) Secondary  Dressing: Wound #2 Right,Proximal,Anterior Lower Leg: ABD pad Wound #3 Right,Distal Lower Leg: ABD pad Dressing Change Frequency: Wound #2 Right,Proximal,Anterior Lower Leg: Three times weekly - HHRN to change on Wednesdays and Fridays and pt to come have it changed at wound care center on Mondays. Wound #3 Right,Distal Lower Leg: Three times weekly - HHRN to change on Wednesdays and Fridays and pt to come have it changed at wound care center on Mondays. Follow-up Appointments: Wound #2 Right,Proximal,Anterior Lower Leg: Return Appointment in 1 week. Wound #3 Right,Distal Lower Leg: Return Appointment in 1 week. Edema Control: Wound #2 Right,Proximal,Anterior Lower Leg: Kerlix and Coban - Right Lower Extremity - wrap 3cm from knee and 3cm from toes please follow this  Elevate legs to the level of the heart and pump ankles as often as possible Wound #3 Right,Distal Lower Leg: Kerlix and Coban - Right Lower Extremity - wrap 3cm from knee and 3cm from toes please follow this [name], [name] ([id number]) Elevate legs to the level of the heart and pump ankles as often as possible Compression Pump: Use compression pump on left lower extremity for 30 minutes, twice daily. Compression Pump: Use compression pump on right lower extremity for 30 minutes, twice daily. Additional Orders / Instructions: Wound #2 Right,Proximal,Anterior Lower Leg: Increase protein intake. Wound #3 Right,Distal Lower Leg: Increase protein intake. Home Health: Wound #2 Right,Proximal,Anterior Lower Leg: Continue Home Health Visits - [facility] Nurse may visit PRN to address patient s wound care needs. FACE TO FACE ENCOUNTER: MEDICARE and MEDICAID PATIENTS: I certify that this patient is under my care and that I had a face-to-face encounter that meets the physician face-to-face encounter requirements with this patient on this date. The encounter with the patient was in whole or in part for  the following MEDICAL CONDITION: (primary reason for Home Healthcare) MEDICAL NECESSITY: I certify, that based on my findings, NURSING services are a medically necessary home health service. HOME BOUND STATUS: I certify that my clinical findings support that this patient is homebound (i.e., Due to illness or injury, pt requires aid of supportive devices such as crutches, cane, wheelchairs, walkers, the use of special transportation or the assistance of another person to leave their place of residence. There is a normal inability to leave the home and doing so requires considerable and taxing effort. Other absences are for medical reasons / religious services and are infrequent or of short duration when for other reasons). If current dressing causes regression in wound condition, may D/C ordered dressing product/s and apply Normal Saline Moist Dressing daily until next Wound Healing Center / Other MD appointment. Notify Wound Healing Center of regression in wound condition at [phone]. Please direct any NON-WOUND related issues/requests for orders to patient's Primary Care Physician Wound #3 Right,Distal Lower Leg: Continue Home Health Visits - [facility] Nurse may visit PRN to address patient s wound care needs. FACE TO FACE ENCOUNTER: MEDICARE and MEDICAID PATIENTS: I certify that this patient is under my care and that I had a face-to-face encounter that meets the physician face-to-face encounter requirements with this patient on this date. The encounter with the patient was in whole or in part for the following MEDICAL CONDITION: (primary reason for Home Healthcare) MEDICAL NECESSITY: I certify, that based on my findings, NURSING services are a medically necessary home health service. HOME BOUND STATUS: I certify that my clinical findings support that this patient is homebound (i.e., Due to illness or injury, pt requires aid of supportive devices such as crutches, cane,  wheelchairs, walkers, the use of special transportation or the assistance of another person to leave their place of residence. There is a normal inability to leave the home and doing so requires considerable and taxing effort. Other absences are for medical reasons / religious services and are infrequent or of short duration when for other reasons). If current dressing causes regression in wound condition, may D/C ordered dressing product/s and apply Normal Saline Moist Dressing daily until next Wound Healing Center / Other MD appointment. Notify Wound Healing Center of regression in wound condition at [phone]. Please direct any NON-WOUND related issues/requests for orders to patient's Primary Care Physician Medications-please add to medication list.: Wound #2 Right,Proximal,Anterior Lower Leg:  Other: - Vitamin C, Zinc, Multivitamin Wound #3 Right,Distal Lower Leg: Other: - Vitamin C, Zinc, Multivitamin [name], [name] ([id number]) After a careful review, I have recommended: 1. cover the wound with Hydrofera Blue and then apply a light Kerlix and Coban and wrap from his toes to below the knee. Should be changed 3 times a week 2. he may use his lymphedema pumps twice a day for an hour each 3. Elevate his limbs as much as possible and has discussed this in great detail with the caregivers 4. Adequate protein, vitamin A, vitamin C and zinc 5. Review of the wound center every week Electronic Signature(s) Signed: 01/06/2017 9:31:10 AM By: Evlyn Kanner MD, FACS Entered By: Evlyn Kanner on 01/06/2017 09:31:10 [name], [name] [name] ([id number]) -------------------------------------------------------------------------------- SuperBill Details Patient Name: [name] Date of Service: 01/06/2017 Medical Record Number: [id number] Patient Account Number: [id number] Date of Birth/Sex: 28-Sep-1940 (76 y.o. Male) Treating RN: Phillis Haggis Primary Care Provider: Karlene Einstein Other  Clinician: Referring Provider: Daryel November Treating Provider/Extender: Rudene Re in Treatment: 6 Diagnosis Coding ICD-10 Codes Code Description I89.0 Lymphedema, not elsewhere classified I50.9 Heart failure, unspecified S81.811A Laceration without foreign body, right lower leg, initial encounter L97.212 Non-pressure chronic ulcer of right calf with fat layer exposed Facility Procedures CPT4 Code: [id number] Description: 99214 - WOUND CARE VISIT-LEV 4 EST PT Modifier: Quantity: 1 Physician Procedures CPT4 Code Description: [id number] - WC PHYS LEVEL 3 - EST PT ICD-10 Description Diagnosis I89.0 Lymphedema, not elsewhere classified I50.9 Heart failure, unspecified S81.811A Laceration without foreign body, right lower leg, init L97.212 Non-pressure  chronic ulcer of right calf with fat laye Modifier: ial encounte r exposed Quantity: 1 r Electronic Signature(s) Signed: 01/06/2017 4:16:11 PM By: Alejandro Mulling Signed: 01/06/2017 4:34:04 PM By: Evlyn Kanner MD, FACS Previous Signature: 01/06/2017 9:31:33 AM Version By: Evlyn Kanner MD, FACS Entered By: Alejandro Mulling on 01/06/2017 11:13:58

## 2017-01-10 ENCOUNTER — Encounter: Payer: Medicare Other | Attending: Surgery | Admitting: Surgery

## 2017-01-10 DIAGNOSIS — K219 Gastro-esophageal reflux disease without esophagitis: Secondary | ICD-10-CM | POA: Diagnosis not present

## 2017-01-10 DIAGNOSIS — Z880 Allergy status to penicillin: Secondary | ICD-10-CM | POA: Diagnosis not present

## 2017-01-10 DIAGNOSIS — W19XXXA Unspecified fall, initial encounter: Secondary | ICD-10-CM | POA: Diagnosis not present

## 2017-01-10 DIAGNOSIS — F209 Schizophrenia, unspecified: Secondary | ICD-10-CM | POA: Insufficient documentation

## 2017-01-10 DIAGNOSIS — F319 Bipolar disorder, unspecified: Secondary | ICD-10-CM | POA: Diagnosis not present

## 2017-01-10 DIAGNOSIS — Z79899 Other long term (current) drug therapy: Secondary | ICD-10-CM | POA: Diagnosis not present

## 2017-01-10 DIAGNOSIS — G40909 Epilepsy, unspecified, not intractable, without status epilepticus: Secondary | ICD-10-CM | POA: Diagnosis not present

## 2017-01-10 DIAGNOSIS — Z86718 Personal history of other venous thrombosis and embolism: Secondary | ICD-10-CM | POA: Insufficient documentation

## 2017-01-10 DIAGNOSIS — L97212 Non-pressure chronic ulcer of right calf with fat layer exposed: Secondary | ICD-10-CM | POA: Insufficient documentation

## 2017-01-10 DIAGNOSIS — I509 Heart failure, unspecified: Secondary | ICD-10-CM | POA: Diagnosis not present

## 2017-01-10 DIAGNOSIS — Z7901 Long term (current) use of anticoagulants: Secondary | ICD-10-CM | POA: Insufficient documentation

## 2017-01-10 DIAGNOSIS — J449 Chronic obstructive pulmonary disease, unspecified: Secondary | ICD-10-CM | POA: Diagnosis not present

## 2017-01-10 DIAGNOSIS — S81811A Laceration without foreign body, right lower leg, initial encounter: Secondary | ICD-10-CM | POA: Diagnosis not present

## 2017-01-10 DIAGNOSIS — I890 Lymphedema, not elsewhere classified: Secondary | ICD-10-CM | POA: Insufficient documentation

## 2017-01-10 DIAGNOSIS — Z7982 Long term (current) use of aspirin: Secondary | ICD-10-CM | POA: Diagnosis not present

## 2017-01-10 NOTE — Progress Notes (Signed)
[name], [name] ([id number]) Visit Report for 01/10/2017 Chief Complaint Document Details Patient Name: [name] Date of Service: 01/10/2017 12:30 PM Medical Record Number: [id number] Patient Account Number: [id number] Date of Birth/Sex: 08/14/1940 (76 y.o. Male) Treating RN: Phillis Haggis Primary Care Provider: Karlene Einstein Other Clinician: Referring Provider: Daryel November Treating Provider/Extender: Rudene Re in Treatment: 7 Information Obtained from: Patient Chief Complaint Patient presents to the wound care center for a consult due non healing wound. The non-healing wound of the right lower extremity which has been present for 4 days. Electronic Signature(s) Signed: 01/10/2017 11:53:33 AM By: Evlyn Kanner MD, FACS Entered By: Evlyn Kanner on 01/10/2017 11:53:33 [name] ([id number]) -------------------------------------------------------------------------------- HPI Details Patient Name: [name] Date of Service: 01/10/2017 12:30 PM Medical Record Number: [id number] Patient Account Number: [id number] Date of Birth/Sex: 05-18-1941 (76 y.o. Male) Treating RN: Phillis Haggis Primary Care Provider: Karlene Einstein Other Clinician: Referring Provider: Daryel November Treating Provider/Extender: Rudene Re in Treatment: 7 History of Present Illness Location: right lower extremity wound Quality: Patient reports experiencing a dull pain to affected area(s). Severity: Patient states wound are getting worse. Duration: Patient has had the wound for 4 days prior to presenting for treatment Timing: Pain in wound is Intermittent (comes and goes Context: The wound occurred when the patient had a fall and cut his leg which required several sutures and staples. Modifying Factors: Consults to this date include:he has had bilateral lower limb lymphedema and has been using lymphedema pumps. Associated Signs and Symptoms: Patient  reports having difficulty standing for long periods. HPI Description: 76 year old male was recently seen in the ER on 11/18/16, due to a fall and had a large right lower leg laceration. past medical history is significant for bipolar disorder, CHF, COPD, DVT, schizophrenia and seizure disorder. In the past he has also been treated by the vascular surgeons for lymphedema and was recommended compression stockings and lymphedema pump. In the ER a right leg x-ray showed soft tissue swelling without any acute bony abnormality and there was demineralization with degenerative changes of the right knee. he had a repair of his wound and his anticoagulation was held for 24 hours. He was asked to follow-up with the wound care department. ER notes the length of the wound was 30 cm and the depth was 10 mm. 3-0 nylon horizontal mattress sutures and multiple staples were used to close the wound. he was prescribed Keflex by the ER. Of note the patient does not have a follow-up appointment at the ER and his care has been transferred over to our wound center 12/06/2016 -- his caregiver tells me that he was treated for her diarrhea with ciprofloxacin and probiotics and this was put on by his PCP after an appropriate stool culture was reviewed. 12/30/2016 -- he was recently admitted to the hospital between 12/23/2016 and 12/25/2016 for proctocolitis, urinary tract infection with hematuria and syncope. a CT confirmed proctocolitis and he was given ciprofloxacin for 6 days. He also was given supportive care for his other ailments. The dressing he had on his right lower extremity was not done in the proper manner and had significant pedal edema and edema in the area below his knee ======== Old notes: 76 year old gentleman who has a history of seizures and schizophrenia with bipolar disorder enhances in a assisted living facility. He also has CHF, COPD, GERD and remote history of DVT. He has been treated by the  vascular group at [facility] for bilateral lymphedema and has  been using lymphedema pumps. This problem started on September 30 when he had a lacerated wound after a fall. The staples were left in for about 10 days and when the staples were removed the wound opened out. Since then he's had several [name], [name] ([id number]) issues with wound infection and the most recent culture showed a heavy growth of Pseudomonas aeruginosa and Staphylococcus aureus and he has received clindamycin and the most recent past and from today he is on levofloxacin. Addendum:Notes were reviewed from a previous vascular consult at [facility] by Dr. Gilda Crease who saw him on 05/27/2014. He had recommended graduated compression stockings class I and also added lymph pumps to improve the control of the patient's lymphedema. 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 Diffuse soft-tissue swelling correlate with bone scan recommended. 07/15/2015 -- he is still awaiting insurance clearance for his MRI and other than that he has no other changes. 07/21/2015 -- he now has the insurance clearance and his MRI has been scheduled for December 28th. 08/11/2014 -- the MRI of his left lower extremity showed there was no bone destruction or periosteal reaction and there was a soft tissue wound along the lateral aspect of the distal left lower leg with skin thickening but there was no drainable fluid collection to suggest an abscess. ======= Electronic Signature(s) Signed: 01/10/2017 11:53:48 AM By: Evlyn Kanner MD, FACS Entered By: Evlyn Kanner on 01/10/2017 11:53:47 [name], [name] ([id number]) -------------------------------------------------------------------------------- Physical Exam Details Patient Name: [name] Date of Service: 01/10/2017 12:30 PM Medical Record Number: [id number] Patient  Account Number: [id number] Date of Birth/Sex: October 07, 1940 (76 y.o. Male) Treating RN: Phillis Haggis Primary Care Provider: Karlene Einstein Other Clinician: Referring Provider: Daryel November Treating Provider/Extender: Rudene Re in Treatment: 7 Constitutional . Pulse regular. Respirations normal and unlabored. Afebrile. . Eyes Nonicteric. Reactive to light. Ears, Nose, Mouth, and Throat Lips, teeth, and gums WNL.Marland Kitchen Moist mucosa without lesions. Neck supple and nontender. No palpable supraclavicular or cervical adenopathy. Normal sized without goiter. Respiratory WNL. No retractions.. Cardiovascular Pedal Pulses WNL. No clubbing, cyanosis or edema. Lymphatic No adneopathy. No adenopathy. No adenopathy. Musculoskeletal Adexa without tenderness or enlargement.. Digits and nails w/o clubbing, cyanosis, infection, petechiae, ischemia, or inflammatory conditions.. Integumentary (Hair, Skin) No suspicious lesions. No crepitus or fluctuance. No peri-wound warmth or erythema. No masses.Marland Kitchen Psychiatric Judgement and insight Intact.. No evidence of depression, anxiety, or agitation.. Notes the wound looks excellent and no debridement was required today. The lymphedema has gone down significantly. Electronic Signature(s) Signed: 01/10/2017 11:54:14 AM By: Evlyn Kanner MD, FACS Entered By: Evlyn Kanner on 01/10/2017 11:54:13 [name], [name] ([id number]) -------------------------------------------------------------------------------- Physician Orders Details Patient Name: [name] Date of Service: 01/10/2017 12:30 PM Medical Record Number: [id number] Patient Account Number: [id number] Date of Birth/Sex: [birth year] (76 y.o. Male) Treating RN: Curtis Sites Primary Care Provider: Karlene Einstein Other Clinician: Referring Provider: Daryel November Treating Provider/Extender: Rudene Re in Treatment: 7 Verbal / Phone Orders: Yes Clinician: Curtis Sites Read Back and Verified: Yes Diagnosis Coding Wound Cleansing Wound #2 Right,Proximal,Anterior Lower Leg o Clean wound with Normal Saline. Wound #3 Right,Distal Lower Leg o Clean wound with Normal Saline. Anesthetic Wound #2 Right,Proximal,Anterior Lower Leg o Topical Lidocaine 4% cream applied to wound bed prior to debridement - for clinic use Wound #3 Right,Distal Lower Leg o Topical Lidocaine 4% cream applied to wound bed prior to debridement - for clinic  use Skin Barriers/Peri-Wound Care o Moisturizing lotion - not on wounds Primary Wound Dressing Wound #2 Right,Proximal,Anterior Lower Leg o Hydrafera Blue - ready transfer o Other: - steri-strips (DO NOT REMOVE) Wound #3 Right,Distal Lower Leg o Other: - steri-strips (DO NOT REMOVE) Secondary Dressing Wound #2 Right,Proximal,Anterior Lower Leg o ABD pad Wound #3 Right,Distal Lower Leg o ABD pad Dressing Change Frequency Wound #2 Right,Proximal,Anterior Lower Leg o Three times weekly - HHRN to change on Wednesdays and Fridays and pt to come have it changed at wound care center on Mondays. [name], [name] ([id number]) Wound #3 Right,Distal Lower Leg o Three times weekly - HHRN to change on Wednesdays and Fridays and pt to come have it changed at wound care center on Mondays. Follow-up Appointments Wound #2 Right,Proximal,Anterior Lower Leg o Return Appointment in 1 week. Wound #3 Right,Distal Lower Leg o Return Appointment in 1 week. Edema Control Wound #2 Right,Proximal,Anterior Lower Leg o Kerlix and Coban - Right Lower Extremity - wrap 3cm from knee and 3cm from toes please follow this o Elevate legs to the level of the heart and pump ankles as often as possible Wound #3 Right,Distal Lower Leg o Kerlix and Coban - Right Lower Extremity - wrap 3cm from knee and 3cm from toes please follow this o Elevate legs to the level of the heart and pump ankles as often as  possible o Compression Pump: Use compression pump on left lower extremity for 30 minutes, twice daily. o Compression Pump: Use compression pump on right lower extremity for 30 minutes, twice daily. Additional Orders / Instructions Wound #2 Right,Proximal,Anterior Lower Leg o Increase protein intake. Wound #3 Right,Distal Lower Leg o Increase protein intake. Home Health Wound #2 Right,Proximal,Anterior Lower Leg o Continue [facility] Visits - [facility] o Home Health Nurse may visit PRN to address patientos wound care needs. o FACE TO FACE ENCOUNTER: MEDICARE and MEDICAID PATIENTS: I certify that this patient is under my care and that I had a face-to-face encounter that meets the physician face-to-face encounter requirements with this patient on this date. The encounter with the patient was in whole or in part for the following MEDICAL CONDITION: (primary reason for Home Healthcare) MEDICAL NECESSITY: I certify, that based on my findings, NURSING services are a medically necessary home health service. HOME BOUND STATUS: I certify that my clinical findings support that this patient is homebound (i.e., Due to illness or injury, pt requires aid of supportive devices such as crutches, cane, wheelchairs, walkers, the use of special transportation or the assistance of another person to leave their place of residence. There is a normal inability to leave the home and doing so requires considerable and taxing effort. Other [name], [name] ([id number]) absences are for medical reasons / religious services and are infrequent or of short duration when for other reasons). o If current dressing causes regression in wound condition, may D/C ordered dressing product/s and apply Normal Saline Moist Dressing daily until next Wound Healing Center / Other MD appointment. Notify Wound Healing Center of regression in wound condition at [phone]. o Please direct any NON-WOUND related  issues/requests for orders to patient's Primary Care Physician Wound #3 Right,Distal Lower Leg o Continue [facility] Visits - [facility] o Home Health Nurse may visit PRN to address patientos wound care needs. o FACE TO FACE ENCOUNTER: MEDICARE and MEDICAID PATIENTS: I certify that this patient is under my care and that I had a face-to-face encounter that meets the physician face-to-face encounter requirements with this  patient on this date. The encounter with the patient was in whole or in part for the following MEDICAL CONDITION: (primary reason for Home Healthcare) MEDICAL NECESSITY: I certify, that based on my findings, NURSING services are a medically necessary home health service. HOME BOUND STATUS: I certify that my clinical findings support that this patient is homebound (i.e., Due to illness or injury, pt requires aid of supportive devices such as crutches, cane, wheelchairs, walkers, the use of special transportation or the assistance of another person to leave their place of residence. There is a normal inability to leave the home and doing so requires considerable and taxing effort. Other absences are for medical reasons / religious services and are infrequent or of short duration when for other reasons). o If current dressing causes regression in wound condition, may D/C ordered dressing product/s and apply Normal Saline Moist Dressing daily until next Wound Healing Center / Other MD appointment. Notify Wound Healing Center of regression in wound condition at [phone]. o Please direct any NON-WOUND related issues/requests for orders to patient's Primary Care Physician Medications-please add to medication list. Wound #2 Right,Proximal,Anterior Lower Leg o Other: - Vitamin C, Zinc, Multivitamin Wound #3 Right,Distal Lower Leg o Other: - Vitamin C, Zinc, Multivitamin Electronic Signature(s) Signed: 01/10/2017 3:36:12 PM By: Evlyn KannerBritto, Jaelene Garciagarcia MD, FACS Signed: 01/10/2017  3:57:57 PM By: Curtis Sitesorthy, Joanna Entered By: Curtis Sitesorthy, Joanna on 01/10/2017 11:46:30 [name], [name] HutchingJERRY L. ([id number]) -------------------------------------------------------------------------------- Problem List Details Patient Name: [name] DarbyRAWFORD, Khanh L. Date of Service: 01/10/2017 12:30 PM Medical Record Number: [id number] Patient Account Number: [id number] Date of Birth/Sex: March 19, 1941 [age] y.o. Male) Treating RN: Phillis HaggisPinkerton, Debi Primary Care Provider: Karlene EinsteinASANAYAKA, GAYANI Other Clinician: Referring Provider: Daryel NovemberWILLIAMS, JONATHAN Treating Provider/Extender: Rudene ReBritto, Orlanda Frankum Weeks in Treatment: 7 Active Problems ICD-10 Encounter Code Description Active Date Diagnosis I89.0 Lymphedema, not elsewhere classified 11/22/2016 Yes I50.9 Heart failure, unspecified 11/22/2016 Yes S81.811A Laceration without foreign body, right lower leg, initial 11/22/2016 Yes encounter L97.212 Non-pressure chronic ulcer of right calf with fat layer 11/22/2016 Yes exposed Inactive Problems Resolved Problems Electronic Signature(s) Signed: 01/10/2017 11:53:04 AM By: Evlyn KannerBritto, Kaydan Wilhoite MD, FACS Entered By: Evlyn KannerBritto, Fiorela Pelzer on 01/10/2017 11:53:04 [name], [name] HutchingJERRY L. ([id number]) -------------------------------------------------------------------------------- Progress Note Details Patient Name: [name] DarbyRAWFORD, [name] Date of Service: 01/10/2017 12:30 PM Medical Record Number: [id number] Patient Account Number: [id number] Date of Birth/Sex: March 19, 1941 95(76 y.o. Male) Treating RN: Phillis HaggisPinkerton, Debi Primary Care Provider: Karlene EinsteinASANAYAKA, GAYANI Other Clinician: Referring Provider: Daryel NovemberWILLIAMS, JONATHAN Treating Provider/Extender: Rudene ReBritto, Achaia Garlock Weeks in Treatment: 7 Subjective Chief Complaint Information obtained from Patient Patient presents to the wound care center for a consult due non healing wound. The non-healing wound of the right lower extremity which has been present for 4 days. History of Present Illness (HPI) The following HPI elements were  documented for the patient's wound: Location: right lower extremity wound Quality: Patient reports experiencing a dull pain to affected area(s). Severity: Patient states wound are getting worse. Duration: Patient has had the wound for 4 days prior to presenting for treatment Timing: Pain in wound is Intermittent (comes and goes Context: The wound occurred when the patient had a fall and cut his leg which required several sutures and staples. Modifying Factors: Consults to this date include:he has had bilateral lower limb lymphedema and has been using lymphedema pumps. Associated Signs and Symptoms: Patient reports having difficulty standing for long periods. 76 year old male was recently seen in the ER on 11/18/16, due to a fall and had a large right lower leg laceration. past medical history is significant for bipolar  disorder, CHF, COPD, DVT, schizophrenia and seizure disorder. In the past he has also been treated by the vascular surgeons for lymphedema and was recommended compression stockings and lymphedema pump. In the ER a right leg x-ray showed soft tissue swelling without any acute bony abnormality and there was demineralization with degenerative changes of the right knee. he had a repair of his wound and his anticoagulation was held for 24 hours. He was asked to follow-up with the wound care department. ER notes the length of the wound was 30 cm and the depth was 10 mm. 3-0 nylon horizontal mattress sutures and multiple staples were used to close the wound. he was prescribed Keflex by the ER. Of note the patient does not have a follow-up appointment at the ER and his care has been transferred over to our wound center 12/06/2016 -- his caregiver tells me that he was treated for her diarrhea with ciprofloxacin and probiotics and this was put on by his PCP after an appropriate stool culture was reviewed. 12/30/2016 -- he was recently admitted to the hospital between 12/23/2016 and  12/25/2016 for proctocolitis, urinary tract infection with hematuria and syncope. a CT confirmed proctocolitis and he was given ciprofloxacin for 6 days. He also was given supportive care for his other ailments. The dressing he had on his right lower extremity was not done in the proper manner and had significant pedal edema and edema in the area below his knee [name], [name] ([id number]) ======== Old notes: 76 year old gentleman who has a history of seizures and schizophrenia with bipolar disorder enhances in a assisted living facility. He also has CHF, COPD, GERD and remote history of DVT. He has been treated by the vascular group at [facility] for bilateral lymphedema and has been using lymphedema pumps. This problem started on September 30 when he had a lacerated wound after a fall. The staples were left in for about 10 days and when the staples were removed the wound opened out. Since then he's had several issues with wound infection and the most recent culture showed a heavy growth of Pseudomonas aeruginosa and Staphylococcus aureus and he has received clindamycin and the most recent past and from today he is on levofloxacin. Addendum:Notes were reviewed from a previous vascular consult at [facility] by Dr. Gilda Crease who saw him on 05/27/2014. He had recommended graduated compression stockings class I and also added lymph pumps to improve the control of the patient's lymphedema. 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 Diffuse soft-tissue swelling correlate with bone scan recommended. 07/15/2015 -- he is still awaiting insurance clearance for his MRI and other than that he has no other changes. 07/21/2015 -- he now has the insurance clearance and his MRI has been scheduled for December 28th. 08/11/2014 -- the MRI of his left lower extremity showed there was no bone  destruction or periosteal reaction and there was a soft tissue wound along the lateral aspect of the distal left lower leg with skin thickening but there was no drainable fluid collection to suggest an abscess. ======= Objective Constitutional Pulse regular. Respirations normal and unlabored. Afebrile. Vitals Time Taken: 11:33 AM, Height: 70 in, Weight: 253 lbs, BMI: 36.3, Temperature: 98.3 F, Pulse: 65 bpm, Respiratory Rate: 18 breaths/min, Blood Pressure: 120/67 mmHg. Eyes Nonicteric. Reactive to light. Ears, Nose, Mouth, and Throat Lips, teeth, and gums WNL.Marland Kitchen Moist mucosa without lesions. Neck supple and nontender. No palpable  supraclavicular or cervical adenopathy. Normal sized without goiter. Heid, Kasim L. ([id number]) Respiratory WNL. No retractions.. Cardiovascular Pedal Pulses WNL. No clubbing, cyanosis or edema. Lymphatic No adneopathy. No adenopathy. No adenopathy. Musculoskeletal Adexa without tenderness or enlargement.. Digits and nails w/o clubbing, cyanosis, infection, petechiae, ischemia, or inflammatory conditions.Marland Kitchen Psychiatric Judgement and insight Intact.. No evidence of depression, anxiety, or agitation.. General Notes: the wound looks excellent and no debridement was required today. The lymphedema has gone down significantly. Integumentary (Hair, Skin) No suspicious lesions. No crepitus or fluctuance. No peri-wound warmth or erythema. No masses.. Wound #2 status is Open. Original cause of wound was Trauma. The wound is located on the Right,Proximal,Anterior Lower Leg. The wound measures 0.5cm length x 13cm width x 0.1cm depth; 5.105cm^2 area and 0.511cm^3 volume. The wound is limited to skin breakdown. There is no tunneling or undermining noted. There is a large amount of serosanguineous drainage noted. The wound margin is distinct with the outline attached to the wound base. There is large (67-100%) red granulation within the wound bed. There is a small  (1-33%) amount of necrotic tissue within the wound bed including Adherent Slough. Periwound temperature was noted as No Abnormality. The periwound has tenderness on palpation. Wound #3 status is Open. Original cause of wound was Trauma. The wound is located on the Right,Distal Lower Leg. The wound measures 0.1cm length x 4.2cm width x 0.1cm depth; 0.33cm^2 area and 0.033cm^3 volume. There is no tunneling or undermining noted. There is a none present amount of drainage noted. The wound margin is distinct with the outline attached to the wound base. There is no granulation within the wound bed. There is a large (67-100%) amount of necrotic tissue within the wound bed including Eschar. Periwound temperature was noted as No Abnormality. The periwound has tenderness on palpation. Assessment Active Problems ICD-10 I89.0 - Lymphedema, not elsewhere classified I50.9 - Heart failure, unspecified S81.811A - Laceration without foreign body, right lower leg, initial encounter [name], [name] 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 Wound #3 Right,Distal Lower Leg: Clean wound with Normal Saline. Anesthetic: Wound #2 Right,Proximal,Anterior Lower Leg: Topical Lidocaine 4% cream applied to wound bed prior to debridement - for clinic use Wound #3 Right,Distal Lower Leg: Topical Lidocaine 4% cream applied to wound bed prior to debridement - for clinic use Skin Barriers/Peri-Wound Care: Moisturizing lotion - not on wounds Primary Wound Dressing: Wound #2 Right,Proximal,Anterior Lower Leg: Hydrafera Blue - ready transfer Other: - steri-strips (DO NOT REMOVE) Wound #3 Right,Distal Lower Leg: Other: - steri-strips (DO NOT REMOVE) Secondary Dressing: Wound #2 Right,Proximal,Anterior Lower Leg: ABD pad Wound #3 Right,Distal Lower Leg: ABD pad Dressing  Change Frequency: Wound #2 Right,Proximal,Anterior Lower Leg: Three times weekly - HHRN to change on Wednesdays and Fridays and pt to come have it changed at wound care center on Mondays. Wound #3 Right,Distal Lower Leg: Three times weekly - HHRN to change on Wednesdays and Fridays and pt to come have it changed at wound care center on Mondays. Follow-up Appointments: Wound #2 Right,Proximal,Anterior Lower Leg: Return Appointment in 1 week. Wound #3 Right,Distal Lower Leg: Return Appointment in 1 week. Edema Control: Wound #2 Right,Proximal,Anterior Lower Leg: Kerlix and Coban - Right Lower Extremity - wrap 3cm from knee and 3cm from toes please follow this Elevate legs to the level of the heart and pump ankles as often as possible Wound #3 Right,Distal Lower  Leg: Kerlix and Coban - Right Lower Extremity - wrap 3cm from knee and 3cm from toes please follow this Zuno, Kendrick L. ([id number]) Elevate legs to the level of the heart and pump ankles as often as possible Compression Pump: Use compression pump on left lower extremity for 30 minutes, twice daily. Compression Pump: Use compression pump on right lower extremity for 30 minutes, twice daily. Additional Orders / Instructions: Wound #2 Right,Proximal,Anterior Lower Leg: Increase protein intake. Wound #3 Right,Distal Lower Leg: Increase protein intake. Home Health: Wound #2 Right,Proximal,Anterior Lower Leg: Continue Home Health Visits - [facility] Nurse may visit PRN to address patient s wound care needs. FACE TO FACE ENCOUNTER: MEDICARE and MEDICAID PATIENTS: I certify that this patient is under my care and that I had a face-to-face encounter that meets the physician face-to-face encounter requirements with this patient on this date. The encounter with the patient was in whole or in part for the following MEDICAL CONDITION: (primary reason for Home Healthcare) MEDICAL NECESSITY: I certify, that based on my findings,  NURSING services are a medically necessary home health service. HOME BOUND STATUS: I certify that my clinical findings support that this patient is homebound (i.e., Due to illness or injury, pt requires aid of supportive devices such as crutches, cane, wheelchairs, walkers, the use of special transportation or the assistance of another person to leave their place of residence. There is a normal inability to leave the home and doing so requires considerable and taxing effort. Other absences are for medical reasons / religious services and are infrequent or of short duration when for other reasons). If current dressing causes regression in wound condition, may D/C ordered dressing product/s and apply Normal Saline Moist Dressing daily until next Wound Healing Center / Other MD appointment. Notify Wound Healing Center of regression in wound condition at [phone]. Please direct any NON-WOUND related issues/requests for orders to patient's Primary Care Physician Wound #3 Right,Distal Lower Leg: Continue Home Health Visits - [facility] Nurse may visit PRN to address patient s wound care needs. FACE TO FACE ENCOUNTER: MEDICARE and MEDICAID PATIENTS: I certify that this patient is under my care and that I had a face-to-face encounter that meets the physician face-to-face encounter requirements with this patient on this date. The encounter with the patient was in whole or in part for the following MEDICAL CONDITION: (primary reason for Home Healthcare) MEDICAL NECESSITY: I certify, that based on my findings, NURSING services are a medically necessary home health service. HOME BOUND STATUS: I certify that my clinical findings support that this patient is homebound (i.e., Due to illness or injury, pt requires aid of supportive devices such as crutches, cane, wheelchairs, walkers, the use of special transportation or the assistance of another person to leave their place of residence. There is  a normal inability to leave the home and doing so requires considerable and taxing effort. Other absences are for medical reasons / religious services and are infrequent or of short duration when for other reasons). If current dressing causes regression in wound condition, may D/C ordered dressing product/s and apply Normal Saline Moist Dressing daily until next Wound Healing Center / Other MD appointment. Notify Wound Healing Center of regression in wound condition at [phone]. Please direct any NON-WOUND related issues/requests for orders to patient's Primary Care Physician Medications-please add to medication list.: Wound #2 Right,Proximal,Anterior Lower Leg: Other: - Vitamin C, Zinc, Multivitamin Wound #3 Right,Distal Lower Leg: Other: - Vitamin C, Zinc, Multivitamin Sciara, Woodruff  L. ([id number]) he is making excellent progress and I have recommended: 1. cover the wound with Hydrofera Blue and then apply a light Kerlix and Coban and wrap from his toes to below the knee. Should be changed 3 times a week 2. he may use his lymphedema pumps twice a day for an hour each 3. Elevate his limbs as much as possible and has discussed this in great detail with the caregivers 4. Adequate protein, vitamin A, vitamin C and zinc 5. Review of the wound center every week Electronic Signature(s) Signed: 01/10/2017 11:55:05 AM By: Evlyn Kanner MD, FACS Entered By: Evlyn Kanner on 01/10/2017 11:55:05 [place], [name] ([id number]) -------------------------------------------------------------------------------- SuperBill Details Patient Name: [name] Date of Service: 01/10/2017 Medical Record Number: [id number] Patient Account Number: [id number] Date of Birth/Sex: 1940/08/11 (76 y.o. Male) Treating RN: Phillis Haggis Primary Care Provider: Karlene Einstein Other Clinician: Referring Provider: Daryel November Treating Provider/Extender: Rudene Re in Treatment: 7 Diagnosis  Coding ICD-10 Codes Code Description I89.0 Lymphedema, not elsewhere classified I50.9 Heart failure, unspecified S81.811A Laceration without foreign body, right lower leg, initial encounter L97.212 Non-pressure chronic ulcer of right calf with fat layer exposed Facility Procedures CPT4 Code: 08657846 Description: 99213 - WOUND CARE VISIT-LEV 3 EST PT Modifier: Quantity: 1 Physician Procedures CPT4 Code Description: [id number] - WC PHYS LEVEL 3 - EST PT ICD-10 Description Diagnosis I89.0 Lymphedema, not elsewhere classified I50.9 Heart failure, unspecified S81.811A Laceration without foreign body, right lower leg, init L97.212 Non-pressure  chronic ulcer of right calf with fat laye Modifier: ial encounte r exposed Quantity: 1 r Electronic Signature(s) Signed: 01/10/2017 11:58:50 AM By: Curtis Sites Signed: 01/10/2017 3:36:12 PM By: Evlyn Kanner MD, FACS Previous Signature: 01/10/2017 11:55:30 AM Version By: Evlyn Kanner MD, FACS Entered By: Curtis Sites on 01/10/2017 11:58:49

## 2017-01-10 NOTE — Progress Notes (Signed)
[name], [name] ([id number]) Visit Report for 01/10/2017 Arrival Information Details Patient Name: [name], [name] Date of Service: 01/10/2017 12:30 PM Medical Record Number: [id number] Patient Account Number: [id number] Date of Birth/Sex: November 28, 1940 (76 y.o. Male) Treating RN: [name] Primary Care [name]: [name] Other Clinician: Referring [name]: [name] Treating [name]/Extender: [name] in Treatment: 7 Visit Information History Since Last Visit Added or deleted any medications: No Patient Arrived: Wheel Chair Any new allergies or adverse reactions: No Arrival Time: 11:32 Had a fall or experienced change in No activities of daily living that may affect Accompanied By: staff risk of falls: Transfer Assistance: Hoyer Lift Signs or symptoms of abuse/neglect since last No Patient Identification Verified: Yes visito Secondary Verification Process Yes Hospitalized since last visit: No Completed: Has Dressing in Place as Prescribed: Yes Patient Requires Transmission-Based No Has Compression in Place as Prescribed: Yes Precautions: Pain Present Now: No Patient Has Alerts: No Electronic Signature(s) Signed: 01/10/2017 3:57:57 PM By: [name] Entered By: [name] on 01/10/2017 11:32:59 [name], [name] ([id number]) -------------------------------------------------------------------------------- Clinic Level of Care Assessment Details Patient Name: [name] Date of Service: 01/10/2017 12:30 PM Medical Record Number: [id number] Patient Account Number: [id number] Date of Birth/Sex: 12-22-1940 (76 y.o. Male) Treating RN: [name] Primary Care Jakaleb Payer: [name] Other Clinician: Referring Cataleya Cristina: [name] Treating [name]/Extender: [name] in Treatment: 7 Clinic Level of Care Assessment Items TOOL 4 Quantity Score []  - Use when only an EandM is performed on FOLLOW-UP visit  0 ASSESSMENTS - Nursing Assessment / Reassessment X - Reassessment of Co-morbidities (includes updates in patient status) 1 10 X - Reassessment of Adherence to Treatment Plan 1 5 ASSESSMENTS - Wound and Skin Assessment / Reassessment X - Simple Wound Assessment / Reassessment - one wound 1 5 []  - Complex Wound Assessment / Reassessment - multiple wounds 0 []  - Dermatologic / Skin Assessment (not related to wound area) 0 ASSESSMENTS - Focused Assessment []  - Circumferential Edema Measurements - multi extremities 0 []  - Nutritional Assessment / Counseling / Intervention 0 X - Lower Extremity Assessment (monofilament, tuning fork, pulses) 1 5 []  - Peripheral Arterial Disease Assessment (using hand held doppler) 0 ASSESSMENTS - Ostomy and/or Continence Assessment and Care []  - Incontinence Assessment and Management 0 []  - Ostomy Care Assessment and Management (repouching, etc.) 0 PROCESS - Coordination of Care X - Simple Patient / Family Education for ongoing care 1 15 []  - Complex (extensive) Patient / Family Education for ongoing care 0 []  - Staff obtains Chiropractor, Records, Test Results / Process Orders 0 []  - Staff telephones HHA, Nursing Homes / Clarify orders / etc 0 []  - Routine Transfer to another Facility (non-emergent condition) 0 [name], [name] ([id number]) []  - Routine Hospital Admission (non-emergent condition) 0 []  - New Admissions / Manufacturing engineer / Ordering NPWT, Apligraf, etc. 0 []  - Emergency Hospital Admission (emergent condition) 0 X - Simple Discharge Coordination 1 10 []  - Complex (extensive) Discharge Coordination 0 PROCESS - Special Needs []  - Pediatric / Minor Patient Management 0 []  - Isolation Patient Management 0 []  - Hearing / Language / Visual special needs 0 []  - Assessment of Community assistance (transportation, D/C planning, etc.) 0 []  - Additional assistance / Altered mentation 0 []  - Support Surface(s) Assessment (bed, cushion, seat, etc.)  0 INTERVENTIONS - Wound Cleansing / Measurement X - Simple Wound Cleansing - one wound 1 5 []  - Complex Wound Cleansing - multiple wounds 0 X - Wound Imaging (photographs -  any number of wounds) 1 5 []  - Wound Tracing (instead of photographs) 0 X - Simple Wound Measurement - one wound 1 5 []  - Complex Wound Measurement - multiple wounds 0 INTERVENTIONS - Wound Dressings []  - Small Wound Dressing one or multiple wounds 0 []  - Medium Wound Dressing one or multiple wounds 0 X - Large Wound Dressing one or multiple wounds 1 20 []  - Application of Medications - topical 0 []  - Application of Medications - injection 0 INTERVENTIONS - Miscellaneous []  - External ear exam 0 [name], [name] ([id number]) []  - Specimen Collection (cultures, biopsies, blood, body fluids, etc.) 0 []  - Specimen(s) / Culture(s) sent or taken to Lab for analysis 0 []  - Patient Transfer (multiple staff / Michiel [name] Lift / Similar devices) 0 []  - Simple Staple / Suture removal (25 or less) 0 []  - Complex Staple / Suture removal (26 or more) 0 []  - Hypo / Hyperglycemic Management (close monitor of Blood Glucose) 0 []  - Ankle / Brachial Index (ABI) - do not check if billed separately 0 X - Vital Signs 1 5 Has the patient been seen at the hospital within the last three years: Yes Total Score: 90 Level Of Care: New/Established - Level 3 Electronic Signature(s) Signed: 01/10/2017 3:57:57 PM By: [name] Entered By: [name] on 01/10/2017 11:58:41 [name] ([id number]) -------------------------------------------------------------------------------- Encounter Discharge Information Details Patient Name: [name] Date of Service: 01/10/2017 12:30 PM Medical Record Number: [id number] Patient Account Number: [id number] Date of Birth/Sex: 1941/03/13 (76 y.o. Male) Treating RN: [name] Primary Care Suleima Ohlendorf: [name] Other Clinician: Referring Kegan Shepardson: [name] Treating  Parth Mccormac/Extender: [name] in Treatment: 7 Encounter Discharge Information Items Discharge Pain Level: 0 Discharge Condition: Stable Ambulatory Status: Wheelchair Discharge Destination: Nursing Home Transportation: Other Accompanied By: caregiver Schedule Follow-up Appointment: Yes Medication Reconciliation completed No and provided to Patient/Care Kayleena Eke: Provided on Clinical Summary of Care: 01/10/2017 Form Type Recipient Paper Patient JC Electronic Signature(s) Signed: 01/10/2017 11:55:46 AM By: Francie Massing Entered By: Francie Massing on 01/10/2017 11:55:46 [name], [name] ([id number]) -------------------------------------------------------------------------------- Lower Extremity Assessment Details Patient Name: [name] Date of Service: 01/10/2017 12:30 PM Medical Record Number: [id number] Patient Account Number: [id number] Date of Birth/Sex: 16-Dec-1940 (76 y.o. Male) Treating RN: [name] Primary Care Brieanna Nau: [name] Other Clinician: Referring Avamarie Crossley: [name] Treating Raydan Schlabach/Extender: [name] in Treatment: 7 Vascular Assessment Pulses: Dorsalis Pedis Doppler Audible: [Right:Yes] Posterior Tibial Extremity colors, hair growth, and conditions: Extremity Color: [Right:Hyperpigmented] Temperature of Extremity: [Right:Warm] Capillary Refill: [Right:< 3 seconds] Electronic Signature(s) Signed: 01/10/2017 3:57:57 PM By: [name] Entered By: [name] on 01/10/2017 11:39:44 [name], [name] ([id number]) -------------------------------------------------------------------------------- Multi Wound Chart Details Patient Name: [name] Date of Service: 01/10/2017 12:30 PM Medical Record Number: [id number] Patient Account Number: [id number] Date of Birth/Sex: 11/14/1940 (76 y.o. Male) Treating RN: [name] Primary Care Carl Bleecker: [name] Other Clinician: Referring Noelia Lenart:  [name] Treating Emberlie Gotcher/Extender: [name] in Treatment: 7 Vital Signs Height(in): 70 Pulse(bpm): 65 Weight(lbs): 253 Blood Pressure 120/67 (mmHg): Body Mass Index(BMI): 36 Temperature(F): 98.3 Respiratory Rate 18 (breaths/min): Photos: [2:No Photos] [3:No Photos] [N/A:N/A] Wound Location: [2:Right Lower Leg - Anterior, Proximal] [3:Right Lower Leg - Distal] [N/A:N/A] Wounding Event: [2:Trauma] [3:Trauma] [N/A:N/A] Primary Etiology: [2:Trauma, Other] [3:Trauma, Other] [N/A:N/A] Comorbid History: [2:Lymphedema, Chronic Obstructive Pulmonary Disease (COPD), Sleep Apnea, Congestive Heart Failure, Deep Vein Thrombosis, Hypertension, Seizure Disorder] [3:Lymphedema, Chronic Obstructive Pulmonary Disease (COPD), Sleep Apnea,  Congestive Heart Failure, Deep  Vein Thrombosis, Hypertension, Seizure Disorder] [N/A:N/A] Date Acquired: [2:11/18/2016] [3:11/18/2016] [N/A:N/A] Weeks of Treatment: [2:7] [3:7] [N/A:N/A] Wound Status: [2:Open] [3:Open] [N/A:N/A] Measurements L x W x D 0.5x13x0.1 [3:0.1x4.2x0.1] [N/A:N/A] (cm) Area (cm) : [2:5.105] [3:0.33] [N/A:N/A] Volume (cm) : [2:0.511] [3:0.033] [N/A:N/A] % Reduction in Area: [2:13.30%] [3:88.00%] [N/A:N/A] % Reduction in Volume: 13.20% [3:88.00%] [N/A:N/A] Classification: [2:Partial Thickness] [3:Partial Thickness] [N/A:N/A] Exudate Amount: [2:Large] [3:None Present] [N/A:N/A] Exudate Type: [2:Serosanguineous] [3:N/A] [N/A:N/A] Exudate Color: [2:red, brown] [3:N/A] [N/A:N/A] Wound Margin: [2:Distinct, outline attached] [3:Distinct, outline attached] [N/A:N/A] Granulation Amount: [2:Large (67-100%)] [3:None Present (0%)] [N/A:N/A] Granulation Quality: [2:Red] [3:N/A] [N/A:N/A] Necrotic Amount: [2:Small (1-33%)] [3:Large (67-100%)] [N/A:N/A] Necrotic Tissue: Adherent Slough Eschar N/A Exposed Structures: Fascia: No N/A N/A Fat Layer (Subcutaneous Tissue) Exposed: No Tendon: No Muscle: No Joint: No Bone:  No Limited to Skin Breakdown Epithelialization: Small (1-33%) Large (67-100%) N/A Periwound Skin Texture: No Abnormalities Noted No Abnormalities Noted N/A Periwound Skin No Abnormalities Noted No Abnormalities Noted N/A Moisture: Periwound Skin Color: No Abnormalities Noted No Abnormalities Noted N/A Temperature: No Abnormality No Abnormality N/A Tenderness on Yes Yes N/A Palpation: Wound Preparation: Ulcer Cleansing: Ulcer Cleansing: N/A Rinsed/Irrigated with Rinsed/Irrigated with Saline Saline Topical Anesthetic Topical Anesthetic Applied: Other: lidocaine Applied: Other: lidocaine 4% 4% Treatment Notes Wound #2 (Right, Proximal, Anterior Lower Leg) 1. Cleansed with: Clean wound with Normal Saline Cleanse wound with antibacterial soap and water 2. Anesthetic Topical Lidocaine 4% cream to wound bed prior to debridement 3. Peri-wound Care: Moisturizing lotion 4. Dressing Applied: Hydrafera Blue 5. Secondary Dressing Applied Dry Gauze 7. Secured with Tape Notes kerlix, coban, steri-strips Wound #3 (Right, Distal Lower Leg) 1. Cleansed with: Clean wound with Normal Saline Cleanse wound with antibacterial soap and water Leavey, Velma L. ([id number]) 3. Peri-wound Care: Moisturizing lotion 5. Secondary Dressing Applied ABD Pad 7. Secured with Tape Notes kerlix, coban, steri-strips Electronic Signature(s) Signed: 01/10/2017 11:53:14 AM By: Evlyn KannerBritto, Errol MD, FACS Entered By: Evlyn KannerBritto, Errol on 01/10/2017 11:53:14 Ouk, [name] HutchingJERRY L. ([id number]) -------------------------------------------------------------------------------- Multi-Disciplinary Care Plan Details Patient Name: [name] DarbyRAWFORD, Regnald L. Date of Service: 01/10/2017 12:30 PM Medical Record Number: [id number] Patient Account Number: [id number] Date of Birth/Sex: [birth year](76 y.o. Male) Treating RN: [name] Sitesorthy, Joanna Primary Care Myrtha Tonkovich: [name] EinsteinASANAYAKA, GAYANI Other Clinician: Referring Jenita Rayfield: [name] NovemberWILLIAMS,  JONATHAN Treating Mireille Lacombe/Extender: [name] ReBritto, Errol Weeks in Treatment: 7 Active Inactive ` Abuse / Safety / Falls / Self Care Management Nursing Diagnoses: Potential for falls Goals: Patient will remain injury free Date Initiated: 11/22/2016 Target Resolution Date: 02/05/2017 Goal Status: Active Interventions: Assess fall risk on admission and as needed Assess impairment of mobility on admission and as needed per policy Notes: ` Nutrition Nursing Diagnoses: Imbalanced nutrition Potential for alteratiion in Nutrition/Potential for imbalanced nutrition Goals: Patient/caregiver agrees to and verbalizes understanding of need to use nutritional supplements and/or vitamins as prescribed Date Initiated: 11/22/2016 Target Resolution Date: 02/12/2017 Goal Status: Active Interventions: Assess patient nutrition upon admission and as needed per policy Provide education on nutrition Treatment Activities: Education provided on Nutrition : 11/22/2016 [name] DarbyCRAWFORD, Jailan L. ([id number]) Notes: ` Orientation to the Wound Care Program Nursing Diagnoses: Knowledge deficit related to the wound healing center program Goals: Patient/caregiver will verbalize understanding of the Wound Healing Center Program Date Initiated: 11/22/2016 Target Resolution Date: 12/11/2016 Goal Status: Active Interventions: Provide education on orientation to the wound center Notes: ` Pain, Acute or Chronic Nursing Diagnoses: Pain, acute or chronic: actual or potential Potential alteration in comfort, pain Goals: Patient/caregiver will verbalize adequate pain control between visits Date Initiated: 11/22/2016 Target Resolution  Date: 03/12/2017 Goal Status: Active Interventions: Assess comfort goal upon admission Complete pain assessment as per visit requirements Notes: ` Wound/Skin Impairment Nursing Diagnoses: Impaired tissue integrity Knowledge deficit related to ulceration/compromised skin  integrity Goals: Ulcer/skin breakdown will have a volume reduction of 80% by week 12 Date Initiated: 11/22/2016 Target Resolution Date: 03/05/2017 Goal Status: Active ADVAITH, LAMARQUE ([id number]) Interventions: Assess patient/caregiver ability to perform ulcer/skin care regimen upon admission and as needed Assess ulceration(s) every visit Notes: Electronic Signature(s) Signed: 01/10/2017 3:57:57 PM By: [name] Entered By: [name] on 01/10/2017 11:43:15 [name], [name] ([id number]) -------------------------------------------------------------------------------- Pain Assessment Details Patient Name: [name] Date of Service: 01/10/2017 12:30 PM Medical Record Number: [id number] Patient Account Number: [id number] Date of Birth/Sex: Apr 08, 1941 (76 y.o. Male) Treating RN: [name] Primary Care Rayn Shorb: [name] Other Clinician: Referring Miklo Aken: [name] Treating Kaiel Weide/Extender: [name] in Treatment: 7 Active Problems Location of Pain Severity and Description of Pain Patient Has Paino No Site Locations Pain Management and Medication Current Pain Management: Electronic Signature(s) Signed: 01/10/2017 3:57:57 PM By: [name] Entered By: [name] on 01/10/2017 11:33:04 [name], [name] ([id number]) -------------------------------------------------------------------------------- Patient/Caregiver Education Details Patient Name: [name] Date of Service: 01/10/2017 12:30 PM Medical Record Number: [id number] Patient Account Number: [id number] Date of Birth/Gender: 1940-09-02 (76 y.o. Male) Treating RN: [name] Primary Care Physician: [name] Other Clinician: Referring Physician: Daryel [name] Treating Physician/Extender: [name] in Treatment: 7 Education Assessment Education Provided To: Patient and Caregiver Education Topics Provided Wound/Skin  Impairment: Handouts: Other: change dressing as ordered Methods: Demonstration, Explain/Verbal Responses: State content correctly Electronic Signature(s) Signed: 01/10/2017 3:57:57 PM By: [name] Entered By: [name] on 01/10/2017 11:44:12 [name], [name] ([id number]) -------------------------------------------------------------------------------- Wound Assessment Details Patient Name: [name] Date of Service: 01/10/2017 12:30 PM Medical Record Number: [id number] Patient Account Number: [id number] Date of Birth/Sex: 06-07-1941 (76 y.o. Male) Treating RN: [name] Primary Care Jerron Niblack: [name] Other Clinician: Referring Marissa Weaver: [name] Treating Giamarie Bueche/Extender: [name] in Treatment: 7 Wound Status Wound Number: 2 Primary Trauma, Other Etiology: Wound Location: Right Lower Leg - Anterior, Proximal Wound Open Status: Wounding Event: Trauma Comorbid Lymphedema, Chronic Obstructive Date Acquired: 11/18/2016 History: Pulmonary Disease (COPD), Sleep Weeks Of Treatment: 7 Apnea, Congestive Heart Failure, Deep Clustered Wound: No Vein Thrombosis, Hypertension, Seizure Disorder Photos Photo Uploaded By: [name] on 01/10/2017 13:10:15 Wound Measurements Length: (cm) 0.5 Width: (cm) 13 Depth: (cm) 0.1 Area: (cm) 5.105 Volume: (cm) 0.511 % Reduction in Area: 13.3% % Reduction in Volume: 13.2% Epithelialization: Small (1-33%) Tunneling: No Undermining: No Wound Description Classification: Partial Thickness Foul Odor Aft Wound Margin: Distinct, outline attached Slough/Fibrin Exudate Amount: Large Exudate Type: Serosanguineous Exudate Color: red, brown er Cleansing: No o Yes Wound Bed Granulation Amount: Large (67-100%) Exposed Structure Granulation Quality: Red Fascia Exposed: No [name], Michial L. ([id number]) Necrotic Amount: Small (1-33%) Fat Layer (Subcutaneous Tissue) Exposed: No Necrotic  Quality: Adherent Slough Tendon Exposed: No Muscle Exposed: No Joint Exposed: No Bone Exposed: No Limited to Skin Breakdown Periwound Skin Texture Texture Color No Abnormalities Noted: No No Abnormalities Noted: No Moisture Temperature / Pain No Abnormalities Noted: No Temperature: No Abnormality Tenderness on Palpation: Yes Wound Preparation Ulcer Cleansing: Rinsed/Irrigated with Saline Topical Anesthetic Applied: Other: lidocaine 4%, Treatment Notes Wound #2 (Right, Proximal, Anterior Lower Leg) 1. Cleansed with: Clean wound with Normal Saline Cleanse wound with antibacterial soap and water 2. Anesthetic Topical Lidocaine 4% cream to wound bed prior to debridement  3. Peri-wound Care: Moisturizing lotion 4. Dressing Applied: Hydrafera Blue 5. Secondary Dressing Applied Dry Gauze 7. Secured with Tape Notes kerlix, coban, steri-strips Electronic Signature(s) Signed: 01/10/2017 3:57:57 PM By: [name] Entered By: [name] on 01/10/2017 11:41:14 Stubbe, [name] ([id number]) -------------------------------------------------------------------------------- Wound Assessment Details Patient Name: [name] Date of Service: 01/10/2017 12:30 PM Medical Record Number: [id number] Patient Account Number: [id number] Date of Birth/Sex: June 04, 1941 (76 y.o. Male) Treating RN: [name] Primary Care Diavian Furgason: [name] Other Clinician: Referring Yaire Kreher: [name] Treating Debany Vantol/Extender: [name] in Treatment: 7 Wound Status Wound Number: 3 Primary Trauma, Other Etiology: Wound Location: Right Lower Leg - Distal Wound Open Wounding Event: Trauma Status: Date Acquired: 11/18/2016 Comorbid Lymphedema, Chronic Obstructive Weeks Of Treatment: 7 History: Pulmonary Disease (COPD), Sleep Clustered Wound: No Apnea, Congestive Heart Failure, Deep Vein Thrombosis, Hypertension, Seizure Disorder Photos Photo Uploaded By:  [name] on 01/10/2017 13:10:16 Wound Measurements Length: (cm) 0.1 Width: (cm) 4.2 Depth: (cm) 0.1 Area: (cm) 0.33 Volume: (cm) 0.033 % Reduction in Area: 88% % Reduction in Volume: 88% Epithelialization: Large (67-100%) Tunneling: No Undermining: No Wound Description Classification: Partial Thickness Foul Odor Afte Wound Margin: Distinct, outline attached Slough/Fibrino Exudate Amount: None Present r Cleansing: No No Wound Bed Granulation Amount: None Present (0%) Necrotic Amount: Large (67-100%) Necrotic Quality: Eschar Caulder, Wilkie L. ([id number]) Periwound Skin Texture Texture Color No Abnormalities Noted: No No Abnormalities Noted: No Moisture Temperature / Pain No Abnormalities Noted: No Temperature: No Abnormality Tenderness on Palpation: Yes Wound Preparation Ulcer Cleansing: Rinsed/Irrigated with Saline Topical Anesthetic Applied: Other: lidocaine 4%, Treatment Notes Wound #3 (Right, Distal Lower Leg) 1. Cleansed with: Clean wound with Normal Saline Cleanse wound with antibacterial soap and water 3. Peri-wound Care: Moisturizing lotion 5. Secondary Dressing Applied ABD Pad 7. Secured with Tape Notes kerlix, coban, steri-strips Electronic Signature(s) Signed: 01/10/2017 3:57:57 PM By: [name] Entered By: [name] on 01/10/2017 11:41:38 Boomer, [name] ([id number]) -------------------------------------------------------------------------------- Vitals Details Patient Name: [name] Date of Service: 01/10/2017 12:30 PM Medical Record Number: [id number] Patient Account Number: [id number] Date of Birth/Sex: 12/09/1940 (76 y.o. Male) Treating RN: [name] Primary Care Koi Zangara: [name] Other Clinician: Referring Micha Dosanjh: [name] Treating Neven Fina/Extender: [name] in Treatment: 7 Vital Signs Time Taken: 11:33 Temperature (F): 98.3 Height (in): 70 Pulse (bpm): 65 Weight  (lbs): 253 Respiratory Rate (breaths/min): 18 Body Mass Index (BMI): 36.3 Blood Pressure (mmHg): 120/67 Reference Range: 80 - 120 mg / dl Electronic Signature(s) Signed: 01/10/2017 3:57:57 PM By: [name] Entered By: [name] on 01/10/2017 11:33:59

## 2017-01-17 ENCOUNTER — Ambulatory Visit: Payer: Medicare Other | Admitting: Urology

## 2017-01-17 ENCOUNTER — Encounter: Payer: Medicare Other | Admitting: Surgery

## 2017-01-17 DIAGNOSIS — L97212 Non-pressure chronic ulcer of right calf with fat layer exposed: Secondary | ICD-10-CM | POA: Diagnosis not present

## 2017-01-18 NOTE — Progress Notes (Addendum)
[name], [name] ([id number]) Visit Report for 01/17/2017 Arrival Information Details Patient Name: [name], [name] Date of Service: 01/17/2017 10:30 AM Medical Record Number: [id number] Patient Account Number: [id number] Date of Birth/Sex: March 01, 1941 [age] y.o. Male) Treating RN: Phillis HaggisPinkerton, Debi Primary Care Alder Murri: Karlene EinsteinASANAYAKA, GAYANI Other Clinician: Referring Everette Mall: Daryel NovemberWILLIAMS, JONATHAN Treating Cecillia Menees/Extender: Rudene ReBritto, Errol Weeks in Treatment: 8 Visit Information History Since Last Visit All ordered tests and consults were completed: No Patient Arrived: Wheel Chair Added or deleted any medications: No Arrival Time: 10:33 Any new allergies or adverse reactions: No Accompanied By: caregiver Had a fall or experienced change in No activities of daily living that may affect Transfer Assistance: Michiel SitesHoyer Lift risk of falls: Patient Identification Verified: Yes Signs or symptoms of abuse/neglect since last No Secondary Verification Process Yes visito Completed: Hospitalized since last visit: No Patient Requires Transmission-Based No Has Dressing in Place as Prescribed: Yes Precautions: Has Compression in Place as Prescribed: Yes Patient Has Alerts: No Pain Present Now: No Electronic Signature(s) Signed: 01/17/2017 4:39:14 PM By: Alejandro MullingPinkerton, Debra Entered By: Alejandro MullingPinkerton, Debra on 01/17/2017 10:35:55 [name], [name] HutchingJERRY L. ([id number]) -------------------------------------------------------------------------------- Clinic Level of Care Assessment Details Patient Name: [name], [name] Date of Service: 01/17/2017 10:30 AM Medical Record Number: [id number] Patient Account Number: [id number] Date of Birth/Sex: March 01, 1941 [age] y.o. Male) Treating RN: Phillis HaggisPinkerton, Debi Primary Care Zackerie Sara: Karlene EinsteinASANAYAKA, GAYANI Other Clinician: Referring Joella Saefong: Daryel NovemberWILLIAMS, JONATHAN Treating Delphin Funes/Extender: Rudene ReBritto, Errol Weeks in Treatment: 8 Clinic Level of Care Assessment Items TOOL 4 Quantity  Score X - Use when only an EandM is performed on FOLLOW-UP visit 1 0 ASSESSMENTS - Nursing Assessment / Reassessment X - Reassessment of Co-morbidities (includes updates in patient status) 1 10 X - Reassessment of Adherence to Treatment Plan 1 5 ASSESSMENTS - Wound and Skin Assessment / Reassessment []  - Simple Wound Assessment / Reassessment - one wound 0 X - Complex Wound Assessment / Reassessment - multiple wounds 2 5 []  - Dermatologic / Skin Assessment (not related to wound area) 0 ASSESSMENTS - Focused Assessment []  - Circumferential Edema Measurements - multi extremities 0 []  - Nutritional Assessment / Counseling / Intervention 0 []  - Lower Extremity Assessment (monofilament, tuning fork, pulses) 0 []  - Peripheral Arterial Disease Assessment (using hand held doppler) 0 ASSESSMENTS - Ostomy and/or Continence Assessment and Care []  - Incontinence Assessment and Management 0 []  - Ostomy Care Assessment and Management (repouching, etc.) 0 PROCESS - Coordination of Care []  - Simple Patient / Family Education for ongoing care 0 X - Complex (extensive) Patient / Family Education for ongoing care 1 20 X - Staff obtains ChiropractorConsents, Records, Test Results / Process Orders 1 10 X - Staff telephones HHA, Nursing Homes / Clarify orders / etc 1 10 []  - Routine Transfer to another Facility (non-emergent condition) 0 [name], [name] ([id number]) []  - Routine Hospital Admission (non-emergent condition) 0 []  - New Admissions / Manufacturing engineernsurance Authorizations / Ordering NPWT, Apligraf, etc. 0 []  - Emergency Hospital Admission (emergent condition) 0 X - Simple Discharge Coordination 1 10 []  - Complex (extensive) Discharge Coordination 0 PROCESS - Special Needs []  - Pediatric / Minor Patient Management 0 []  - Isolation Patient Management 0 []  - Hearing / Language / Visual special needs 0 []  - Assessment of Community assistance (transportation, D/C planning, etc.) 0 []  - Additional assistance / Altered  mentation 0 []  - Support Surface(s) Assessment (bed, cushion, seat, etc.) 0 INTERVENTIONS - Wound Cleansing / Measurement []  - Simple Wound Cleansing - one wound 0 X - Complex Wound Cleansing -  multiple wounds 2 5 X - Wound Imaging (photographs - any number of wounds) 1 5 []  - Wound Tracing (instead of photographs) 0 []  - Simple Wound Measurement - one wound 0 X - Complex Wound Measurement - multiple wounds 2 5 INTERVENTIONS - Wound Dressings []  - Small Wound Dressing one or multiple wounds 0 []  - Medium Wound Dressing one or multiple wounds 0 X - Large Wound Dressing one or multiple wounds 1 20 []  - Application of Medications - topical 0 []  - Application of Medications - injection 0 INTERVENTIONS - Miscellaneous []  - External ear exam 0 [name], [name] ([id number]) []  - Specimen Collection (cultures, biopsies, blood, body fluids, etc.) 0 []  - Specimen(s) / Culture(s) sent or taken to Lab for analysis 0 X - Patient Transfer (multiple staff / Michiel Sites Lift / Similar devices) 1 10 []  - Simple Staple / Suture removal (25 or less) 0 []  - Complex Staple / Suture removal (26 or more) 0 []  - Hypo / Hyperglycemic Management (close monitor of Blood Glucose) 0 []  - Ankle / Brachial Index (ABI) - do not check if billed separately 0 X - Vital Signs 1 5 Has the patient been seen at the hospital within the last three years: Yes Total Score: 135 Level Of Care: New/Established - Level 4 Electronic Signature(s) Signed: 01/17/2017 4:39:14 PM By: Alejandro Mulling Entered By: Alejandro Mulling on 01/17/2017 12:32:51 [name], [name] ([id number]) -------------------------------------------------------------------------------- Encounter Discharge Information Details Patient Name: [name] Date of Service: 01/17/2017 10:30 AM Medical Record Number: [id number] Patient Account Number: [id number] Date of Birth/Sex: April 04, 1941 (76 y.o. Male) Treating RN: Phillis Haggis Primary Care Channel Papandrea:  Karlene Einstein Other Clinician: Referring Atia Haupt: Daryel November Treating Bevelyn Arriola/Extender: Rudene Re in Treatment: 8 Encounter Discharge Information Items Discharge Pain Level: 0 Discharge Condition: Stable Ambulatory Status: Wheelchair Discharge Destination: Nursing Home Transportation: Other Accompanied By: self Schedule Follow-up Appointment: No Medication Reconciliation completed and provided to Patient/Care No Ebelyn Bohnet: Provided on Clinical Summary of Care: 01/17/2017 Form Type Recipient Paper Patient JC Electronic Signature(s) Signed: 01/17/2017 4:39:14 PM By: Alejandro Mulling Previous Signature: 01/17/2017 11:11:02 AM Version By: Gwenlyn Perking Entered By: Alejandro Mulling on 01/17/2017 11:26:01 [name], [name] ([id number]) -------------------------------------------------------------------------------- Lower Extremity Assessment Details Patient Name: [name] Date of Service: 01/17/2017 10:30 AM Medical Record Number: [id number] Patient Account Number: [id number] Date of Birth/Sex: 1941-03-23 (76 y.o. Male) Treating RN: Phillis Haggis Primary Care Icholas Irby: Karlene Einstein Other Clinician: Referring Shalicia Craghead: Daryel November Treating Sheryl Saintil/Extender: Rudene Re in Treatment: 8 Vascular Assessment Pulses: Dorsalis Pedis Palpable: [Right:Yes] Doppler Audible: [Right:Yes] Posterior Tibial Extremity colors, hair growth, and conditions: Extremity Color: [Right:Hyperpigmented] Temperature of Extremity: [Right:Warm] Capillary Refill: [Right:< 3 seconds] Electronic Signature(s) Signed: 01/17/2017 4:39:14 PM By: Alejandro Mulling Entered By: Alejandro Mulling on 01/17/2017 10:59:29 [name], [name] ([id number]) -------------------------------------------------------------------------------- Multi Wound Chart Details Patient Name: [name] Date of Service: 01/17/2017 10:30 AM Medical Record Number:  [id number] Patient Account Number: [id number] Date of Birth/Sex: 11-Jan-1941 (76 y.o. Male) Treating RN: Phillis Haggis Primary Care Jamiaya Bina: Karlene Einstein Other Clinician: Referring Olivianna Higley: Daryel November Treating Katrice Goel/Extender: Rudene Re in Treatment: 8 Vital Signs Height(in): 70 Pulse(bpm): 84 Weight(lbs): 253 Blood Pressure 124/82 (mmHg): Body Mass Index(BMI): 36 Temperature(F): 97.8 Respiratory Rate 18 (breaths/min): Photos: [2:No Photos] [3:No Photos] [N/A:N/A] Wound Location: [2:Right Lower Leg - Anterior, Proximal] [3:Right Lower Leg - Distal] [N/A:N/A] Wounding Event: [2:Trauma] [3:Trauma] [N/A:N/A] Primary Etiology: [2:Trauma, Other] [3:Trauma, Other] [N/A:N/A] Comorbid History: [2:Lymphedema, Chronic Obstructive Pulmonary Disease (COPD), Sleep Apnea,  Congestive Heart Failure, Deep Vein Thrombosis, Hypertension, Seizure Disorder] [3:Lymphedema, Chronic Obstructive Pulmonary Disease (COPD), Sleep Apnea,  Congestive Heart Failure, Deep Vein Thrombosis, Hypertension, Seizure Disorder] [N/A:N/A] Date Acquired: [2:11/18/2016] [3:11/18/2016] [N/A:N/A] Weeks of Treatment: [2:8] [3:8] [N/A:N/A] Wound Status: [2:Open] [3:Healed - Epithelialized] [N/A:N/A] Measurements L x W x D 0x0x0 [3:0x0x0] [N/A:N/A] (cm) Area (cm) : [2:0] [3:0] [N/A:N/A] Volume (cm) : [2:0] [3:0] [N/A:N/A] % Reduction in Area: [2:100.00%] [3:100.00%] [N/A:N/A] % Reduction in Volume: 100.00% [3:100.00%] [N/A:N/A] Classification: [2:Partial Thickness] [3:Partial Thickness] [N/A:N/A] Exudate Amount: [2:None Present] [3:None Present] [N/A:N/A] Wound Margin: [2:Distinct, outline attached] [3:Distinct, outline attached] [N/A:N/A] Granulation Amount: [2:None Present (0%)] [3:None Present (0%)] [N/A:N/A] Necrotic Amount: [2:None Present (0%)] [3:None Present (0%)] [N/A:N/A] Exposed Structures: [2:Fascia: No Fat Layer (Subcutaneous Tissue) Exposed: No] [3:N/A] [N/A:N/A] Tendon:  No Muscle: No Joint: No Bone: No Limited to Skin Breakdown Epithelialization: Large (67-100%) Large (67-100%) N/A Periwound Skin Texture: No Abnormalities Noted No Abnormalities Noted N/A Periwound Skin No Abnormalities Noted No Abnormalities Noted N/A Moisture: Periwound Skin Color: No Abnormalities Noted No Abnormalities Noted N/A Temperature: No Abnormality No Abnormality N/A Tenderness on Yes Yes N/A Palpation: Wound Preparation: Ulcer Cleansing: Ulcer Cleansing: N/A Rinsed/Irrigated with Rinsed/Irrigated with Saline Saline Topical Anesthetic Topical Anesthetic Applied: None Applied: None Treatment Notes Electronic Signature(s) Signed: 01/17/2017 11:12:17 AM By: Evlyn Kanner MD, FACS Entered By: Evlyn Kanner on 01/17/2017 11:12:16 Detloff, [name] ([id number]) -------------------------------------------------------------------------------- Multi-Disciplinary Care Plan Details Patient Name: [name] Date of Service: 01/17/2017 10:30 AM Medical Record Number: [id number] Patient Account Number: [id number] Date of Birth/Sex: 14-Nov-1940 (76 y.o. Male) Treating RN: Phillis Haggis Primary Care Marcus Groll: Karlene Einstein Other Clinician: Referring Tansy Lorek: Daryel November Treating Bethany Hirt/Extender: Rudene Re in Treatment: 8 Active Inactive Electronic Signature(s) Signed: 01/17/2017 4:39:14 PM By: Alejandro Mulling Entered By: Alejandro Mulling on 01/17/2017 10:59:49 [name], [name] ([id number]) -------------------------------------------------------------------------------- Pain Assessment Details Patient Name: [name] Date of Service: 01/17/2017 10:30 AM Medical Record Number: [id number] Patient Account Number: [id number] Date of Birth/Sex: Jul 31, 1941 (76 y.o. Male) Treating RN: Phillis Haggis Primary Care Rafeef Lau: Karlene Einstein Other Clinician: Referring Larina Lieurance: Daryel November Treating Demyah Smyre/Extender: Rudene Re in Treatment: 8 Active Problems Location of Pain Severity and Description of Pain Patient Has Paino No Site Locations With Dressing Change: No Pain Management and Medication Current Pain Management: Electronic Signature(s) Signed: 01/17/2017 4:39:14 PM By: Alejandro Mulling Entered By: Alejandro Mulling on 01/17/2017 10:36:02 [name], [name] ([id number]) -------------------------------------------------------------------------------- Patient/Caregiver Education Details Patient Name: [name] Date of Service: 01/17/2017 10:30 AM Medical Record Number: [id number] Patient Account Number: [id number] Date of Birth/Gender: 27-Sep-1940 (76 y.o. Male) Treating RN: Phillis Haggis Primary Care Physician: Karlene Einstein Other Clinician: Referring Physician: Daryel November Treating Physician/Extender: Rudene Re in Treatment: 8 Education Assessment Education Provided To: Patient Education Topics Provided Wound/Skin Impairment: Handouts: Other: Please call or contact or office if you have any questions or concerns. Methods: Explain/Verbal Responses: State content correctly Electronic Signature(s) Signed: 01/17/2017 4:39:14 PM By: Alejandro Mulling Entered By: Alejandro Mulling on 01/17/2017 11:26:18 [name], [name] ([id number]) -------------------------------------------------------------------------------- Wound Assessment Details Patient Name: [name] Date of Service: 01/17/2017 10:30 AM Medical Record Number: [id number] Patient Account Number: [id number] Date of Birth/Sex: 08/22/1940 (76 y.o. Male) Treating RN: Phillis Haggis Primary Care Harrel Ferrone: Karlene Einstein Other Clinician: Referring Ignazio Kincaid: Daryel November Treating Azul Brumett/Extender: Rudene Re in Treatment: 8 Wound Status Wound Number: 2 Primary Trauma, Other Etiology: Wound Location: Right, Proximal, Anterior Lower Leg Wound Healed -  Epithelialized Status: Wounding Event: Trauma  Comorbid Lymphedema, Chronic Obstructive Date Acquired: 11/18/2016 History: Pulmonary Disease (COPD), Sleep Weeks Of Treatment: 8 Apnea, Congestive Heart Failure, Deep Clustered Wound: No Vein Thrombosis, Hypertension, Seizure Disorder Photos Photo Uploaded By: Alejandro Mulling on 01/17/2017 16:33:30 Wound Measurements Area: (cm) Volume: (cm) 0 % Reduction in Area: 100% 0 % Reduction in Volume: 100% Epithelialization: Large (67-100%) Tunneling: No Undermining: No Wound Description Classification: Partial Thickness Wound Margin: Distinct, outline attached Exudate Amount: None Present Foul Odor After Cleansing: No Slough/Fibrino No Wound Bed Granulation Amount: None Present (0%) Exposed Structure Necrotic Amount: None Present (0%) Fascia Exposed: No Fat Layer (Subcutaneous Tissue) Exposed: No Tendon Exposed: No Garoutte, Murrel L. ([id number]) Muscle Exposed: No Joint Exposed: No Bone Exposed: No Limited to Skin Breakdown Periwound Skin Texture Texture Color No Abnormalities Noted: No No Abnormalities Noted: No Moisture Temperature / Pain No Abnormalities Noted: No Temperature: No Abnormality Tenderness on Palpation: Yes Wound Preparation Ulcer Cleansing: Rinsed/Irrigated with Saline Topical Anesthetic Applied: None Electronic Signature(s) Signed: 01/19/2017 4:50:29 PM By: Alejandro Mulling Previous Signature: 01/17/2017 4:39:14 PM Version By: Alejandro Mulling Entered By: Alejandro Mulling on 01/19/2017 10:16:31 Joles, [name] ([id number]) -------------------------------------------------------------------------------- Wound Assessment Details Patient Name: [name] Date of Service: 01/17/2017 10:30 AM Medical Record Number: [id number] Patient Account Number: [id number] Date of Birth/Sex: [birth year] (76 y.o. Male) Treating RN: Ashok Cordia, Debi Primary Care Eugen Jeansonne: Karlene Einstein Other  Clinician: Referring Bryan Omura: Daryel November Treating Fumio Vandam/Extender: Rudene Re in Treatment: 8 Wound Status Wound Number: 3 Primary Trauma, Other Etiology: Wound Location: Right, Distal Lower Leg Wound Healed - Epithelialized Wounding Event: Trauma Status: Date Acquired: 11/18/2016 Comorbid Lymphedema, Chronic Obstructive Weeks Of Treatment: 8 History: Pulmonary Disease (COPD), Sleep Clustered Wound: No Apnea, Congestive Heart Failure, Deep Vein Thrombosis, Hypertension, Seizure Disorder Photos Photo Uploaded By: Alejandro Mulling on 01/17/2017 16:33:31 Wound Measurements Area: (cm) Volume: (cm) 0 % Reduction in Area: 100% 0 % Reduction in Volume: 100% Epithelialization: Large (67-100%) Tunneling: No Undermining: No Wound Description Classification: Partial Thickness Wound Margin: Distinct, outline attached Exudate Amount: None Present Foul Odor After Cleansing: No Slough/Fibrino No Wound Bed Granulation Amount: None Present (0%) Necrotic Amount: None Present (0%) Periwound Skin Texture Morro, Sinan L. ([id number]) Texture Color No Abnormalities Noted: No No Abnormalities Noted: No Moisture Temperature / Pain No Abnormalities Noted: No Temperature: No Abnormality Tenderness on Palpation: Yes Wound Preparation Ulcer Cleansing: Rinsed/Irrigated with Saline Topical Anesthetic Applied: None Electronic Signature(s) Signed: 01/19/2017 4:50:29 PM By: Alejandro Mulling Previous Signature: 01/17/2017 4:39:14 PM Version By: Alejandro Mulling Entered By: Alejandro Mulling on 01/19/2017 10:16:31 Vanwagner, [name] ([id number]) -------------------------------------------------------------------------------- Vitals Details Patient Name: [name] Date of Service: 01/17/2017 10:30 AM Medical Record Number: [id number] Patient Account Number: [id number] Date of Birth/Sex: [birth year] (76 y.o. Male) Treating RN: Phillis Haggis Primary Care Felica Chargois:  Karlene Einstein Other Clinician: Referring Shameka Aggarwal: Daryel November Treating Merita Hawks/Extender: Rudene Re in Treatment: 8 Vital Signs Time Taken: 10:36 Temperature (F): 97.8 Height (in): 70 Pulse (bpm): 84 Weight (lbs): 253 Respiratory Rate (breaths/min): 18 Body Mass Index (BMI): 36.3 Blood Pressure (mmHg): 124/82 Reference Range: 80 - 120 mg / dl Electronic Signature(s) Signed: 01/17/2017 4:39:14 PM By: Alejandro Mulling Entered By: Alejandro Mulling on 01/17/2017 10:37:24

## 2017-01-18 NOTE — Progress Notes (Addendum)
BRANNEN, KOPPEN ([id number]) Visit Report for 01/17/2017 Chief Complaint Document Details Patient Name: [name], [name] Date of Service: 01/17/2017 10:30 AM Medical Record Number: [id number] Patient Account Number: [id number] Date of Birth/Sex: 1940-10-08 (76 y.o. Male) Treating RN: Phillis Haggis Primary Care Provider: Karlene Einstein Other Clinician: Referring Provider: Daryel November Treating Provider/Extender: Rudene Re in Treatment: 8 Information Obtained from: Patient Chief Complaint Patient presents to the wound care center for a consult due non healing wound. The non-healing wound of the right lower extremity which has been present for 4 days. Electronic Signature(s) Signed: 01/17/2017 11:12:25 AM By: Evlyn Kanner MD, FACS Entered By: Evlyn Kanner on 01/17/2017 11:12:24 Tonkinson, [name] ([id number]) -------------------------------------------------------------------------------- HPI Details Patient Name: [name] Date of Service: 01/17/2017 10:30 AM Medical Record Number: [id number] Patient Account Number: [id number] Date of Birth/Sex: [birth year] (76 y.o. Male) Treating RN: Phillis Haggis Primary Care Provider: Karlene Einstein Other Clinician: Referring Provider: Daryel November Treating Provider/Extender: Rudene Re in Treatment: 8 History of Present Illness Location: right lower extremity wound Quality: Patient reports experiencing a dull pain to affected area(s). Severity: Patient states wound are getting worse. Duration: Patient has had the wound for 4 days prior to presenting for treatment Timing: Pain in wound is Intermittent (comes and goes Context: The wound occurred when the patient had a fall and cut his leg which required several sutures and staples. Modifying Factors: Consults to this date include:he has had bilateral lower limb lymphedema and has been using lymphedema pumps. Associated Signs and Symptoms:  Patient reports having difficulty standing for long periods. HPI Description: 76 year old male was recently seen in the ER on 11/18/16, due to a fall and had a large right lower leg laceration. past medical history is significant for bipolar disorder, CHF, COPD, DVT, schizophrenia and seizure disorder. In the past he has also been treated by the vascular surgeons for lymphedema and was recommended compression stockings and lymphedema pump. In the ER a right leg x-ray showed soft tissue swelling without any acute bony abnormality and there was demineralization with degenerative changes of the right knee. he had a repair of his wound and his anticoagulation was held for 24 hours. He was asked to follow-up with the wound care department. ER notes the length of the wound was 30 cm and the depth was 10 mm. 3-0 nylon horizontal mattress sutures and multiple staples were used to close the wound. he was prescribed Keflex by the ER. Of note the patient does not have a follow-up appointment at the ER and his care has been transferred over to our wound center 12/06/2016 -- his caregiver tells me that he was treated for her diarrhea with ciprofloxacin and probiotics and this was put on by his PCP after an appropriate stool culture was reviewed. 12/30/2016 -- he was recently admitted to the hospital between 12/23/2016 and 12/25/2016 for proctocolitis, urinary tract infection with hematuria and syncope. a CT confirmed proctocolitis and he was given ciprofloxacin for 6 days. He also was given supportive care for his other ailments. The dressing he had on his right lower extremity was not done in the proper manner and had significant pedal edema and edema in the area below his knee ======== Old notes: 76 year old gentleman who has a history of seizures and schizophrenia with bipolar disorder enhances in a assisted living facility. He also has CHF, COPD, GERD and remote history of DVT. He has been treated  by the vascular group at [facility] for bilateral lymphedema and has  been using lymphedema pumps. This problem started on September 30 when he had a lacerated wound after a fall. The staples were left in for about 10 days and when the staples were removed the wound opened out. Since then he's had several Slimp, Olumide L. ([id number]) issues with wound infection and the most recent culture showed a heavy growth of Pseudomonas aeruginosa and Staphylococcus aureus and he has received clindamycin and the most recent past and from today he is on levofloxacin. Addendum:Notes were reviewed from a previous vascular consult at [facility] by Dr. Gilda Crease who saw him on 05/27/2014. He had recommended graduated compression stockings class I and also added lymph pumps to improve the control of the patient's lymphedema. 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 Diffuse soft-tissue swelling correlate with bone scan recommended. 07/15/2015 -- he is still awaiting insurance clearance for his MRI and other than that he has no other changes. 07/21/2015 -- he now has the insurance clearance and his MRI has been scheduled for December 28th. 08/11/2014 -- the MRI of his left lower extremity showed there was no bone destruction or periosteal reaction and there was a soft tissue wound along the lateral aspect of the distal left lower leg with skin thickening but there was no drainable fluid collection to suggest an abscess. ======= Electronic Signature(s) Signed: 01/17/2017 11:12:32 AM By: Evlyn Kanner MD, FACS Entered By: Evlyn Kanner on 01/17/2017 11:12:32 Deniston, [name] ([id number]) -------------------------------------------------------------------------------- Physical Exam Details Patient Name: [name] Date of Service: 01/17/2017 10:30 AM Medical Record Number:  [id number] Patient Account Number: [id number] Date of Birth/Sex: May 22, 1941 (76 y.o. Male) Treating RN: Phillis Haggis Primary Care Provider: Karlene Einstein Other Clinician: Referring Provider: Daryel November Treating Provider/Extender: Rudene Re in Treatment: 8 Constitutional . Pulse regular. Respirations normal and unlabored. Afebrile. . Eyes Nonicteric. Reactive to light. Ears, Nose, Mouth, and Throat Lips, teeth, and gums WNL.Marland Kitchen Moist mucosa without lesions. Neck supple and nontender. No palpable supraclavicular or cervical adenopathy. Normal sized without goiter. Respiratory WNL. No retractions.. Cardiovascular Pedal Pulses WNL. No clubbing, cyanosis or edema. Lymphatic No adneopathy. No adenopathy. No adenopathy. Musculoskeletal Adexa without tenderness or enlargement.. Digits and nails w/o clubbing, cyanosis, infection, petechiae, ischemia, or inflammatory conditions.. Integumentary (Hair, Skin) No suspicious lesions. No crepitus or fluctuance. No peri-wound warmth or erythema. No masses.Marland Kitchen Psychiatric Judgement and insight Intact.. No evidence of depression, anxiety, or agitation.. Notes wound is completely healed and his lymphedema is still down significantly. Electronic Signature(s) Signed: 01/17/2017 11:12:54 AM By: Evlyn Kanner MD, FACS Entered By: Evlyn Kanner on 01/17/2017 11:12:54 Prouse, [name] ([id number]) -------------------------------------------------------------------------------- Physician Orders Details Patient Name: [name] Date of Service: 01/17/2017 10:30 AM Medical Record Number: [id number] Patient Account Number: [id number] Date of Birth/Sex: 1941-04-25 (76 y.o. Male) Treating RN: Phillis Haggis Primary Care Provider: Karlene Einstein Other Clinician: Referring Provider: Daryel November Treating Provider/Extender: Rudene Re in Treatment: 8 Verbal / Phone Orders: Yes Clinician: Ashok Cordia, Debi Read  Back and Verified: Yes Diagnosis Coding Wound Cleansing o Clean wound with Normal Saline. - Right leg o Cleanse wound with mild soap and water - Right leg o May Shower, gently pat wound dry prior to applying new dressing. - Right leg Skin Barriers/Peri-Wound Care o Moisturizing lotion - not on wounds Right leg Secondary Dressing o ABD pad - Right leg Dressing Change Frequency o Change dressing every week - HHRN Right leg Edema Control   o Kerlix and Coban - Right Lower Extremity - HHRN wrap 3cm from toes and 3cm from knee Right leg o Elevate legs to the level of the heart and pump ankles as often as possible o Compression Pump: Use compression pump on left lower extremity for 30 minutes, twice daily. o Compression Pump: Use compression pump on right lower extremity for 30 minutes, twice daily. Home Health o Continue Home Health Visits - Continue wrapping right leg once a week for 4 more weeks o Home Health Nurse may visit PRN to address patientos wound care needs. - Right leg o FACE TO FACE ENCOUNTER: MEDICARE and MEDICAID PATIENTS: I certify that this patient is under my care and that I had a face-to-face encounter that meets the physician face-to-face encounter requirements with this patient on this date. The encounter with the patient was in whole or in part for the following MEDICAL CONDITION: (primary reason for Home Healthcare) MEDICAL NECESSITY: I certify, that based on my findings, NURSING services are a medically necessary home health service. HOME BOUND STATUS: I certify that my clinical findings support that this patient is homebound (i.e., Due to illness or injury, pt requires aid of supportive devices such as crutches, cane, wheelchairs, walkers, the use of special transportation or the assistance of another person to leave their place of residence. There is a normal inability to leave the home and doing so requires considerable and taxing  effort. Other absences are for medical reasons / religious services and are infrequent or of short duration when for other reasons). - Right leg Selsor, Malick L. ([id number]) o If current dressing causes regression in wound condition, may D/C ordered dressing product/s and apply Normal Saline Moist Dressing daily until next Wound Healing Center / Other MD appointment. Notify Wound Healing Center of regression in wound condition at [phone]. - Right leg o Please direct any NON-WOUND related issues/requests for orders to patient's Primary Care Physician - Right [facility] o Discharge from Wound Care Center - Please call our office if you have any questions or concerns. HHRN to continue to wrap for 4 more weeks. Facility to get pumps fixed or new pumps ordered. Electronic Signature(s) Signed: 01/17/2017 4:03:37 PM By: Evlyn KannerBritto, Furious Chiarelli MD, FACS Signed: 01/17/2017 4:39:14 PM By: Alejandro MullingPinkerton, Debra Entered By: Alejandro MullingPinkerton, Debra on 01/17/2017 11:25:31 Weyrauch, Doy HutchingJERRY L. ([id number]) -------------------------------------------------------------------------------- Problem List Details Patient Name: [name] DarbyRAWFORD, Johnwesley L. Date of Service: 01/17/2017 10:30 AM Medical Record Number: [id number] Patient Account Number: [id number] Date of Birth/Sex: 1941/05/23 54(76 y.o. Male) Treating RN: Phillis HaggisPinkerton, Debi Primary Care Provider: Karlene EinsteinASANAYAKA, GAYANI Other Clinician: Referring Provider: Daryel NovemberWILLIAMS, JONATHAN Treating Provider/Extender: Rudene ReBritto, Sissi Padia Weeks in Treatment: 8 Active Problems ICD-10 Encounter Code Description Active Date Diagnosis I89.0 Lymphedema, not elsewhere classified 11/22/2016 Yes I50.9 Heart failure, unspecified 11/22/2016 Yes S81.811A Laceration without foreign body, right lower leg, initial 11/22/2016 Yes encounter L97.212 Non-pressure chronic ulcer of right calf with fat layer 11/22/2016 Yes exposed Inactive Problems Resolved Problems Electronic  Signature(s) Signed: 01/17/2017 11:12:12 AM By: Evlyn KannerBritto, Ivon Oelkers MD, FACS Entered By: Evlyn KannerBritto, Draden Cottingham on 01/17/2017 11:12:11 Texeira, Doy HutchingJERRY L. ([id number]) -------------------------------------------------------------------------------- Progress Note Details Patient Name: [name] DarbyRAWFORD, Biran L. Date of Service: 01/17/2017 10:30 AM Medical Record Number: [id number] Patient Account Number: [id number] Date of Birth/Sex: 1941/05/23 104(76 y.o. Male) Treating RN: Phillis HaggisPinkerton, Debi Primary Care Provider: Karlene EinsteinASANAYAKA, GAYANI Other Clinician: Referring Provider: Daryel NovemberWILLIAMS, JONATHAN Treating Provider/Extender: Rudene ReBritto, Zion Lint Weeks in Treatment: 8 Subjective Chief Complaint Information obtained from Patient Patient presents to the wound care center for a consult  due non healing wound. The non-healing wound of the right lower extremity which has been present for 4 days. History of Present Illness (HPI) The following HPI elements were documented for the patient's wound: Location: right lower extremity wound Quality: Patient reports experiencing a dull pain to affected area(s). Severity: Patient states wound are getting worse. Duration: Patient has had the wound for 4 days prior to presenting for treatment Timing: Pain in wound is Intermittent (comes and goes Context: The wound occurred when the patient had a fall and cut his leg which required several sutures and staples. Modifying Factors: Consults to this date include:he has had bilateral lower limb lymphedema and has been using lymphedema pumps. Associated Signs and Symptoms: Patient reports having difficulty standing for long periods. 76 year old male was recently seen in the ER on 11/18/16, due to a fall and had a large right lower leg laceration. past medical history is significant for bipolar disorder, CHF, COPD, DVT, schizophrenia and seizure disorder. In the past he has also been treated by the vascular surgeons for lymphedema and was recommended  compression stockings and lymphedema pump. In the ER a right leg x-ray showed soft tissue swelling without any acute bony abnormality and there was demineralization with degenerative changes of the right knee. he had a repair of his wound and his anticoagulation was held for 24 hours. He was asked to follow-up with the wound care department. ER notes the length of the wound was 30 cm and the depth was 10 mm. 3-0 nylon horizontal mattress sutures and multiple staples were used to close the wound. he was prescribed Keflex by the ER. Of note the patient does not have a follow-up appointment at the ER and his care has been transferred over to our wound center 12/06/2016 -- his caregiver tells me that he was treated for her diarrhea with ciprofloxacin and probiotics and this was put on by his PCP after an appropriate stool culture was reviewed. 12/30/2016 -- he was recently admitted to the hospital between 12/23/2016 and 12/25/2016 for proctocolitis, urinary tract infection with hematuria and syncope. a CT confirmed proctocolitis and he was given ciprofloxacin for 6 days. He also was given supportive care for his other ailments. The dressing he had on his right lower extremity was not done in the proper manner and had significant pedal edema and edema in the area below his knee Butkiewicz, Keland L. ([id number]) ======== Old notes: 76 year old gentleman who has a history of seizures and schizophrenia with bipolar disorder enhances in a assisted living facility. He also has CHF, COPD, GERD and remote history of DVT. He has been treated by the vascular group at [facility] for bilateral lymphedema and has been using lymphedema pumps. This problem started on September 30 when he had a lacerated wound after a fall. The staples were left in for about 10 days and when the staples were removed the wound opened out. Since then he's had several issues with wound infection and the most recent culture  showed a heavy growth of Pseudomonas aeruginosa and Staphylococcus aureus and he has received clindamycin and the most recent past and from today he is on levofloxacin. Addendum:Notes were reviewed from a previous vascular consult at [facility] by Dr. Gilda Crease who saw him on 05/27/2014. He had recommended graduated compression stockings class I and also added lymph pumps to improve the control of the patient's lymphedema. 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 Diffuse soft-tissue swelling correlate with bone scan recommended. 07/15/2015 -- he is still awaiting insurance clearance for his MRI and other than that he has no other changes. 07/21/2015 -- he now has the insurance clearance and his MRI has been scheduled for December 28th. 08/11/2014 -- the MRI of his left lower extremity showed there was no bone destruction or periosteal reaction and there was a soft tissue wound along the lateral aspect of the distal left lower leg with skin thickening but there was no drainable fluid collection to suggest an abscess. ======= Objective Constitutional Pulse regular. Respirations normal and unlabored. Afebrile. Vitals Time Taken: 10:36 AM, Height: 70 in, Weight: 253 lbs, BMI: 36.3, Temperature: 97.8 F, Pulse: 84 bpm, Respiratory Rate: 18 breaths/min, Blood Pressure: 124/82 mmHg. Eyes Nonicteric. Reactive to light. Ears, Nose, Mouth, and Throat Lips, teeth, and gums WNL.Marland Kitchen Moist mucosa without lesions. Neck supple and nontender. No palpable supraclavicular or cervical adenopathy. Normal sized without goiter. Saavedra, Rocko L. ([id number]) Respiratory WNL. No retractions.. Cardiovascular Pedal Pulses WNL. No clubbing, cyanosis or edema. Lymphatic No adneopathy. No adenopathy. No adenopathy. Musculoskeletal Adexa without tenderness or enlargement.. Digits and nails w/o  clubbing, cyanosis, infection, petechiae, ischemia, or inflammatory conditions.Marland Kitchen Psychiatric Judgement and insight Intact.. No evidence of depression, anxiety, or agitation.. General Notes: wound is completely healed and his lymphedema is still down significantly. Integumentary (Hair, Skin) No suspicious lesions. No crepitus or fluctuance. No peri-wound warmth or erythema. No masses.. Wound #2 status is Healed - Epithelialized. Original cause of wound was Trauma. The wound is located on the Right,Proximal,Anterior Lower Leg. The wound is limited to skin breakdown. There is no tunneling or undermining noted. There is a none present amount of drainage noted. The wound margin is distinct with the outline attached to the wound base. There is no granulation within the wound bed. There is no necrotic tissue within the wound bed. Periwound temperature was noted as No Abnormality. The periwound has tenderness on palpation. Wound #3 status is Healed - Epithelialized. Original cause of wound was Trauma. The wound is located on the Right,Distal Lower Leg. There is no tunneling or undermining noted. There is a none present amount of drainage noted. The wound margin is distinct with the outline attached to the wound base. There is no granulation within the wound bed. There is no necrotic tissue within the wound bed. Periwound temperature was noted as No Abnormality. The periwound has tenderness on palpation. Assessment Active Problems ICD-10 I89.0 - Lymphedema, not elsewhere classified I50.9 - Heart failure, unspecified S81.811A - Laceration without foreign body, right lower leg, initial encounter L97.212 - Non-pressure chronic ulcer of right calf with fat layer exposed Offer, Ison L. ([id number]) Plan Wound Cleansing: Clean wound with Normal Saline. - Right leg Cleanse wound with mild soap and water - Right leg May Shower, gently pat wound dry prior to applying new dressing. - Right leg Skin  Barriers/Peri-Wound Care: Moisturizing lotion - not on wounds Right leg Secondary Dressing: ABD pad - Right leg Dressing Change Frequency: Change dressing every week - HHRN Right leg Edema Control: Kerlix and Coban - Right Lower Extremity - HHRN wrap 3cm from toes and 3cm from knee Right leg Elevate legs to the level of the heart and pump ankles as often as possible Compression Pump: Use compression pump on left lower extremity for 30 minutes, twice daily. Compression Pump: Use compression pump on right lower extremity for 30 minutes, twice daily. Home Health: Continue Home Health Visits - Continue  wrapping right leg once a week for 4 more weeks Home Health Nurse may visit PRN to address patient s wound care needs. - Right leg FACE TO FACE ENCOUNTER: MEDICARE and MEDICAID PATIENTS: I certify that this patient is under my care and that I had a face-to-face encounter that meets the physician face-to-face encounter requirements with this patient on this date. The encounter with the patient was in whole or in part for the following MEDICAL CONDITION: (primary reason for Home Healthcare) MEDICAL NECESSITY: I certify, that based on my findings, NURSING services are a medically necessary home health service. HOME BOUND STATUS: I certify that my clinical findings support that this patient is homebound (i.e., Due to illness or injury, pt requires aid of supportive devices such as crutches, cane, wheelchairs, walkers, the use of special transportation or the assistance of another person to leave their place of residence. There is a normal inability to leave the home and doing so requires considerable and taxing effort. Other absences are for medical reasons / religious services and are infrequent or of short duration when for other reasons). - Right leg If current dressing causes regression in wound condition, may D/C ordered dressing product/s and apply Normal Saline Moist Dressing daily until next  Wound Healing Center / Other MD appointment. Notify Wound Healing Center of regression in wound condition at [phone]. - Right leg Please direct any NON-WOUND related issues/requests for orders to patient's Primary Care Physician - Right leg Discharge From [facility]: Discharge from Wound Care Center - Please call our office if you have any questions or concerns. HHRN to continue to wrap for 4 more weeks. Facility to get pumps fixed or new pumps ordered. BAYDEN, GIL ([id number]) I have asked him to continue to elevate and exercise and to continue using his lymphedema pumps on a daily basis for an hour in the morning an hour in the evening. Today we will protect his wound with a light Kerlix and Coban dressing and some foam over his newly healed wound. his home health will continue to direct the wound over the next few weeks Discharge from the wound care services and be seen back only if needed Electronic Signature(s) Signed: 01/20/2017 8:12:52 AM By: Evlyn Kanner MD, FACS Previous Signature: 01/17/2017 4:05:47 PM Version By: Evlyn Kanner MD, FACS Previous Signature: 01/17/2017 4:05:37 PM Version By: Evlyn Kanner MD, FACS Previous Signature: 01/17/2017 11:14:04 AM Version By: Evlyn Kanner MD, FACS Entered By: Evlyn Kanner on 01/20/2017 08:12:52 Pantoja, [name] ([id number]) -------------------------------------------------------------------------------- SuperBill Details Patient Name: [name] Date of Service: 01/17/2017 Medical Record Number: [id number] Patient Account Number: [id number] Date of Birth/Sex: 03/12/1941 (76 y.o. Male) Treating RN: Phillis Haggis Primary Care Provider: Karlene Einstein Other Clinician: Referring Provider: Daryel November Treating Provider/Extender: Rudene Re in Treatment: 8 Diagnosis Coding ICD-10 Codes Code Description I89.0 Lymphedema, not elsewhere classified I50.9 Heart failure, unspecified S81.811A  Laceration without foreign body, right lower leg, initial encounter L97.212 Non-pressure chronic ulcer of right calf with fat layer exposed Facility Procedures CPT4 Code: [id number] Description: 99214 - WOUND CARE VISIT-LEV 4 EST PT Modifier: Quantity: 1 Physician Procedures CPT4 Code Description: [id number] 44010 - WC PHYS LEVEL 2 - EST PT ICD-10 Description Diagnosis I89.0 Lymphedema, not elsewhere classified S81.811A Laceration without foreign body, right lower leg, init L97.212 Non-pressure chronic ulcer of right calf with  fat laye Modifier: ial encounte r exposed Quantity: 1 r Electronic Signature(s) Signed: 01/17/2017 4:03:37 PM By: Evlyn Kanner MD, FACS Signed: 01/17/2017 4:39:14 PM  By: Alejandro Mulling Previous Signature: 01/17/2017 11:14:19 AM Version By: Evlyn Kanner MD, FACS Entered By: Alejandro Mulling on 01/17/2017 12:33:01

## 2017-01-23 NOTE — Progress Notes (Signed)
01/24/2017 9:16 AM   [name] [birth year] [id number]  Referring provider: Lorenda Ishihara, MD [address] [place], [place] [address]  Chief Complaint  Patient presents with  . Urinary Retention    3 month follow up    HPI: Patient is a 76 year old Caucasian male who presents today for a three month follow up for urinary retention.    Background history Patient presented to [place] at the request of [facility] ED for Foley catheter removal.   Patient is a poor historian and the woman with him today, Diane, is just involved with transporting the patient and is not involved with the patients clinically.  Reviewing hospital records, he was having difficulty with voiding in 04/2016 and finasteride was added at that time.  He was on terazosin at the time of admission.  He was also complaining of constipation and was started on MiraLax.   A Foley was placed on 05/21/2016 and 3 liters of urine was drained from the bladder.  He was changed to tamsulosin and had terazosin discontinued.  Finasteride is continued as well.  He was also found to be C. Diff positive.  Patient was admitted to the hospital for UTI, sepsis, ARF and DVT.  He was discharged with the Foley.   He is unable to fill I PSS score or comment regarding his urination ability prior to the Foley catheter placement.  On 06/16/2016, his Foley was removed at that visit for a voiding trial.  He presented back to the office that afternoon in retention and the Foley was replaced.  The home health care team exchanged the Foley on 07/28/2016.    Foley was then removed on 09/13/2016.  One week later, he was seen in the ED for an unwitnessed fall.  He was found to have 900 cc of urine in his bladder. A Foley catheter was replaced and patient subsequently developed gross hematuria. He was admitted to the hospital and anticoagulation therapy was with held.  The hematuria cleared.    Since he was last seen three months ago, he has  had several visits to the ED for syncope and hematuria.  At his ED visit in May, it was recommended that he be admitted.  He refused admission.  A contrast CT performed at that time noted a nonobstructing right renal calculi measuring up to 1.3 cm in size which had increased slightly from a prior CT in 2016.  I have independently reviewed the films.  Today, he has no complaints with his Foley. He has not had any further gross hematuria. His caregiver states that they have discontinued his Eliquis.  He is having back pain but is on the lower right side.  PMH significant for CHF, COPD, DVT, seizure disorder, schizophrenia and Bipolar 1 disorder.    PMH: Past Medical History:  Diagnosis Date  . Bipolar 1 disorder (HCC)   . CHF (congestive heart failure) (HCC)   . COPD (chronic obstructive pulmonary disease) (HCC)   . DVT (deep venous thrombosis) (HCC)   . GERD (gastroesophageal reflux disease)   . High cholesterol   . Hypertension   . Schizophrenia (HCC)   . Seizure [facility])     Surgical History: Past Surgical History:  Procedure Laterality Date  . NO PAST SURGERIES      Home Medications:  Allergies as of 01/24/2017      Reactions   Penicillin G Other (See Comments)   Convulsions. Has patient had a PCN reaction  causing immediate rash, facial/tongue/throat swelling, SOB or lightheadedness with hypotension: not sure Has patient had a PCN reaction causing severe rash involving mucus membranes or skin necrosis: not sure Has patient had a PCN reaction that required hospitalization: not sure Has patient had a PCN reaction occurring within the last 10 years: not sure If all of the above answers are "NO", then may proceed with Cephalosporin use.      Medication List       Accurate as of 01/24/17  9:16 AM. Always use your most recent med list.          acetaminophen 325 MG tablet Commonly known as:  TYLENOL Take 2 tablets (650 mg total) by mouth every 6 (six) hours as needed.     amLODipine 2.5 MG tablet Commonly known as:  NORVASC Take 1 tablet (2.5 mg total) by mouth daily.   buPROPion 300 MG 24 hr tablet Commonly known as:  BUDEPRION XL Take 1 tablet (300 mg total) by mouth daily.   carvedilol 25 MG tablet Commonly known as:  COREG Take 1 tablet (25 mg total) by mouth 2 (two) times daily.   cholecalciferol 1000 units tablet Commonly known as:  VITAMIN D Take 1,000 Units by mouth daily.   ciprofloxacin 500 MG tablet Commonly known as:  CIPRO Take 1 tablet (500 mg total) by mouth 2 (two) times daily. X 6 more days   feeding supplement (ENSURE ENLIVE) Liqd Take 237 mLs by mouth 2 (two) times daily between meals.   finasteride 5 MG tablet Commonly known as:  PROSCAR Take 1 tablet (5 mg total) by mouth daily.   furosemide 20 MG tablet Commonly known as:  LASIX Take 20 mg by mouth.   loperamide 2 MG tablet Commonly known as:  IMODIUM A-D Take 2-4 mg by mouth as needed for diarrhea or loose stools. 4 mg at first episode of diarrhea, then 2 mg for each subsequent episode as needed   omeprazole 20 MG capsule Commonly known as:  PRILOSEC Take 2 capsules (40 mg total) by mouth daily.   PHENobarbital 64.8 MG tablet Commonly known as:  LUMINAL Take 2 tablets (129.6 mg total) by mouth every other day.   polyethylene glycol packet Commonly known as:  MIRALAX / GLYCOLAX Take 17 g by mouth daily as needed for moderate constipation.   potassium chloride SA 20 MEQ tablet Commonly known as:  K-DUR,KLOR-CON Take 40 mEq by mouth 2 (two) times daily.   pravastatin 40 MG tablet Commonly known as:  PRAVACHOL Take 1 tablet (40 mg total) by mouth daily.   tamsulosin 0.4 MG Caps capsule Commonly known as:  FLOMAX Take 1 capsule (0.4 mg total) by mouth daily.   traMADol 50 MG tablet Commonly known as:  ULTRAM Take 1 tablet (50 mg total) by mouth every 6 (six) hours as needed for moderate pain or severe pain.   VENTOLIN HFA 108 (90 Base) MCG/ACT  inhaler Generic drug:  albuterol Inhale 2 puffs into the lungs every 6 (six) hours as needed for shortness of breath.       Allergies:  Allergies  Allergen Reactions  . Penicillin G Other (See Comments)    Convulsions. Has patient had a PCN reaction causing immediate rash, facial/tongue/throat swelling, SOB or lightheadedness with hypotension: not sure Has patient had a PCN reaction causing severe rash involving mucus membranes or skin necrosis: not sure Has patient had a PCN reaction that required hospitalization: not sure Has patient had a PCN reaction occurring within  the last 10 years: not sure If all of the above answers are "NO", then may proceed with Cephalosporin use.    Family History: Family History  Problem Relation Age of Onset  . Healthy Mother   . Prostate cancer Neg Hx   . Kidney disease Neg Hx   . Kidney cancer Neg Hx   . Bladder Cancer Neg Hx     Social History:  reports that he has never smoked. He has never used smokeless tobacco. He reports that he does not drink alcohol or use drugs.  ROS: UROLOGY Frequent Urination?: No Hard to postpone urination?: No Burning/pain with urination?: No Get up at night to urinate?: No Leakage of urine?: No Urine stream starts and stops?: No Trouble starting stream?: No Do you have to strain to urinate?: No Blood in urine?: No Urinary tract infection?: No Sexually transmitted disease?: No Injury to kidneys or bladder?: No Painful intercourse?: No Weak stream?: No Erection problems?: No Penile pain?: No  Gastrointestinal Nausea?: No Vomiting?: No Indigestion/heartburn?: No Diarrhea?: No Constipation?: No  Constitutional Fever: No Night sweats?: No Weight loss?: No Fatigue?: No  Skin Skin rash/lesions?: No Itching?: No  Eyes Blurred vision?: No Double vision?: No  Ears/Nose/Throat Sore throat?: No Sinus problems?: No  Hematologic/Lymphatic Swollen glands?: No Easy bruising?:  No  Cardiovascular Leg swelling?: Yes Chest pain?: No  Respiratory Cough?: No Shortness of breath?: No  Endocrine Excessive thirst?: No  Musculoskeletal Back pain?: No Joint pain?: No  Neurological Headaches?: No Dizziness?: No  Psychologic Depression?: No Anxiety?: No  Physical Exam: BP 116/71   Pulse 81   Ht 5\' 10"  (1.778 m)   Wt 253 lb (114.8 kg)   BMI 36.30 kg/m   Constitutional: Well nourished. Alert and oriented, No acute distress. HEENT: [place] AT, moist mucus membranes. Trachea midline, no masses. Cardiovascular: No clubbing, cyanosis, or edema. Respiratory: Normal respiratory effort, no increased work of breathing. GU and Rectal could not performed due to patient's inability to stand.  Caregiver is concerned about his fall risk.  Skin: No rashes, bruises or suspicious lesions. Lymph: No cervical or inguinal adenopathy. Neurologic: Grossly intact, no focal deficits, moving all 4 extremities. Psychiatric: Normal mood and affect.   Laboratory Data: Lab Results  Component Value Date   WBC 4.4 12/25/2016   HGB 8.4 (L) 12/25/2016   HCT 25.1 (L) 12/25/2016   MCV 83.8 12/25/2016   PLT 209 12/25/2016    Lab Results  Component Value Date   CREATININE 1.19 12/25/2016    Lab Results  Component Value Date   TSH 1.772 04/23/2016     Lab Results  Component Value Date   AST 13 (L) 12/23/2016   Lab Results  Component Value Date   ALT 7 (L) 12/23/2016   Pertinent imaging CLINICAL DATA:  Mid abdominal pain and cramping.  Syncope.  EXAM: CT ABDOMEN AND PELVIS WITH CONTRAST  TECHNIQUE: Multidetector CT imaging of the abdomen and pelvis was performed using the standard protocol following bolus administration of intravenous contrast.  CONTRAST:  75 mL Isovue-300  COMPARISON:  Right upper quadrant abdominal ultrasound 05/26/2016. CT abdomen and pelvis 02/21/2015.  FINDINGS: Lower chest: Trace right and small left pleural effusions.  Minimal bibasilar atelectasis. Normal heart size.  Hepatobiliary: No focal liver abnormality is seen. The gallbladder is grossly unremarkable, with assessment limited by underdistention and mild motion artifact. No significant biliary dilatation is seen.  Pancreas: Mild diffuse atrophy without ductal dilatation. Mild stranding inferior to the pancreatic tail is  not felt to be pancreatic in origin.  Spleen: Unremarkable.  Adrenals/Urinary Tract: Unremarkable adrenal glands. Nonobstructing right renal calculi measuring up to 1.3 cm in size, overall slightly increased from the prior CT. 5.1 cm right upper pole renal cyst has mildly enlarged as have smaller cysts in the right lower pole measuring up to 2 cm in size. Small cysts in the left kidney measure up to 2.2 cm. No hydronephrosis. Poor intravascular enhancement on the portal venous phase images with no significant contrast excretion on delayed images, possibly reflecting an issue with the contrast injection. Foley catheter and small amount of gas in the bladder.  Stomach/Bowel: The stomach is within normal limits. Oral contrast is present in multiple nondilated small bowel loops without evidence of obstruction. There is a moderate amount of stool in the ascending and transverse colon. The rectum and distal sigmoid colon are mildly distended by fluid with mild diffuse wall thickening and moderate surrounding inflammatory change. The more proximal sigmoid colon is very redundant and mildly to moderately distended by gas. There is mild inflammatory stranding which extends more superiorly throughout the mesenteric fat, more notable on the left with a trace amount of fluid at the level of the splenic flexure. The appendix is unremarkable.  Vascular/Lymphatic: Moderate aortoiliac atherosclerosis without aneurysm. No enlarged lymph nodes.  Reproductive:  Unremarkable prostate.  Other: Moderate to prominent diffuse body  wall edema.  Musculoskeletal: Thoracolumbar spondylosis. Moderate to advanced lumbar facet arthrosis.  IMPRESSION: 1. Wall thickening and surrounding inflammation involving the distal sigmoid colon and rectum compatible with proctocolitis. 2. Trace pleural effusions.  Diffuse body wall edema. 3. Nonobstructing right nephrolithiasis. 4.  Aortic Atherosclerosis (ICD10-I70.0).   Electronically Signed   By: Sebastian Ache M.D.   On: 12/23/2016 16:16   Assessment & Plan:    1. Urinary retention:     - home health to exchange the Foley  - Will need cystoscopy in October 2017    2. BPH with LUTS   - Continue Foley management  - Continue tamsulosin 0.4 mg daily and finasteride 5 mg daily   3. History of hematuria  - Resolved with antibiotics and discontinuation of Eliquis   4. Right renal stone  - will discuss with Dr. Apolinar Junes concerning further management  Return for per Dr. Delana Meyer recommendations.  These notes generated with voice recognition software. I apologize for typographical errors.  Michiel Cowboy, PA-C  [facility] [address], [address] [place], [place] [address] [phone]

## 2017-01-24 ENCOUNTER — Encounter: Payer: Self-pay | Admitting: Urology

## 2017-01-24 ENCOUNTER — Telehealth: Payer: Self-pay | Admitting: Urology

## 2017-01-24 ENCOUNTER — Ambulatory Visit (INDEPENDENT_AMBULATORY_CARE_PROVIDER_SITE_OTHER): Payer: Medicare Other | Admitting: Urology

## 2017-01-24 VITALS — BP 116/71 | HR 81 | Ht 70.0 in | Wt 253.0 lb

## 2017-01-24 DIAGNOSIS — Z87448 Personal history of other diseases of urinary system: Secondary | ICD-10-CM

## 2017-01-24 DIAGNOSIS — N138 Other obstructive and reflux uropathy: Secondary | ICD-10-CM

## 2017-01-24 DIAGNOSIS — N401 Enlarged prostate with lower urinary tract symptoms: Secondary | ICD-10-CM | POA: Diagnosis not present

## 2017-01-24 DIAGNOSIS — N200 Calculus of kidney: Secondary | ICD-10-CM | POA: Diagnosis not present

## 2017-01-24 DIAGNOSIS — R339 Retention of urine, unspecified: Secondary | ICD-10-CM

## 2017-01-24 NOTE — Telephone Encounter (Signed)
Please call the patient's caregiver and tell her that we would like to see [name] in three months to monitor the stone.  Advised to contact our office or seek treatment in the ED if becomes febrile or pain/ vomiting are difficult control in order to arrange for emergent/urgent intervention

## 2017-01-24 NOTE — Telephone Encounter (Signed)
[facility] for [facility] caregiver for patient to return our call.

## 2017-01-25 NOTE — Telephone Encounter (Signed)
Spoke with [employer] [place]  [place]

## 2017-03-31 IMAGING — US US EXTREM LOW VENOUS BILAT
1 series · 13 of 24 positions shown · non-contrast
Comparison: None.

CLINICAL DATA: Bilateral calf pain and swelling for 2 weeks.



[Series 1: us extrem low venous bilat · 0.08mm/px · 13 of 59 slices shown]
[im 1/59]
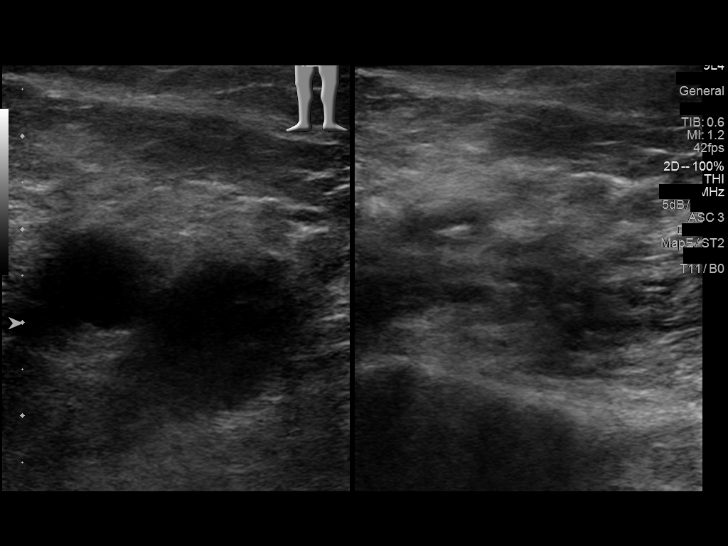
[im 6/59]
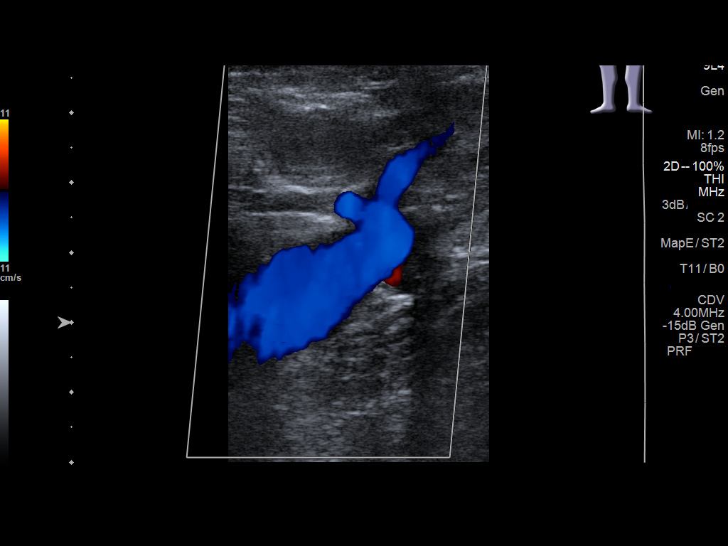
[im 11/59]
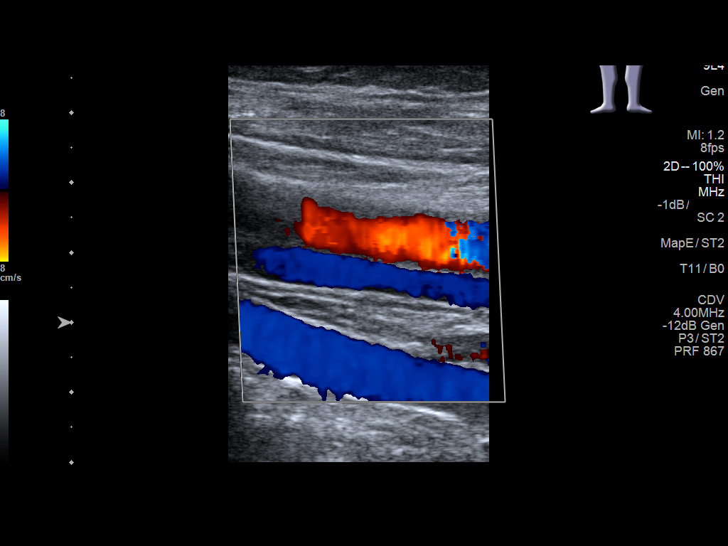
[im 16/59]
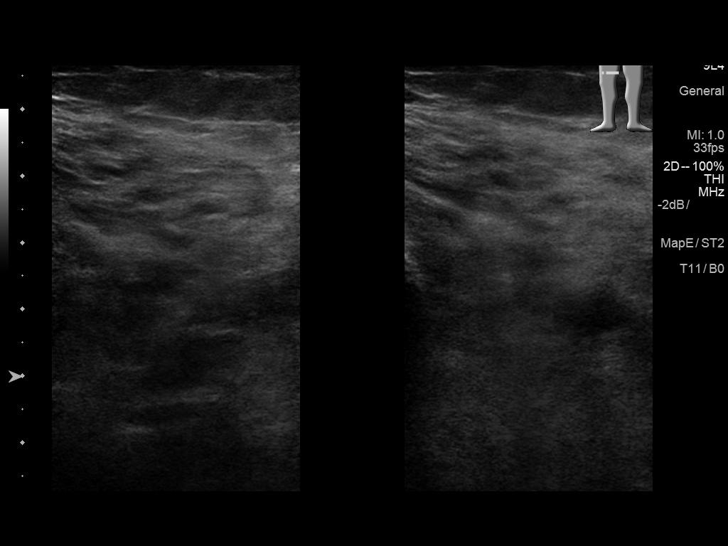
[im 21/59]
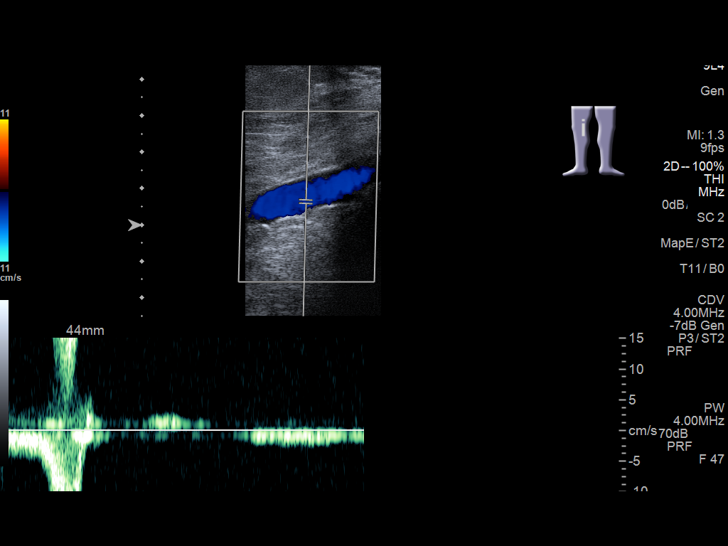
[im 26/59]
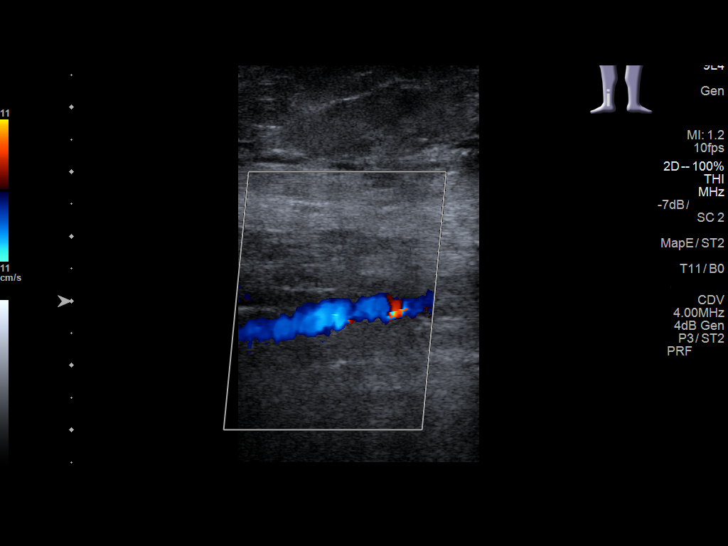
[im 31/59]
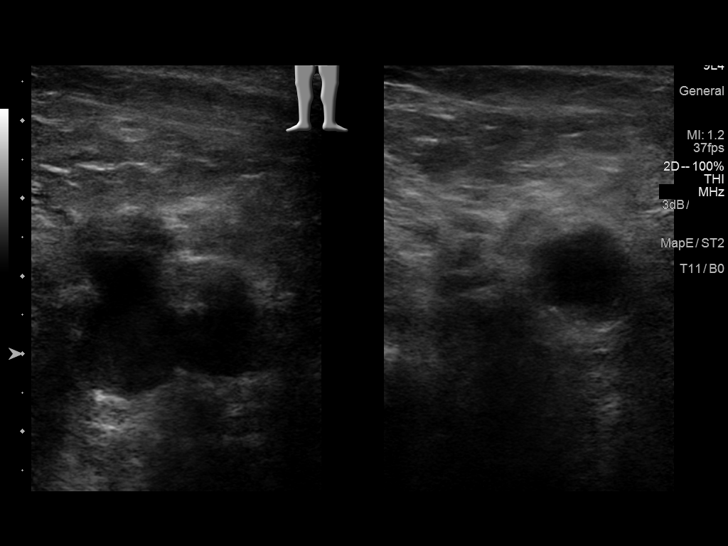
[im 33/59]
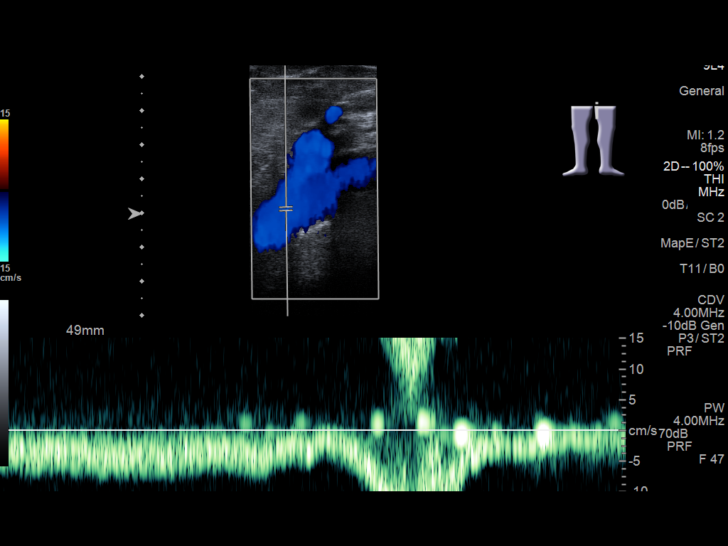
[im 38/59]
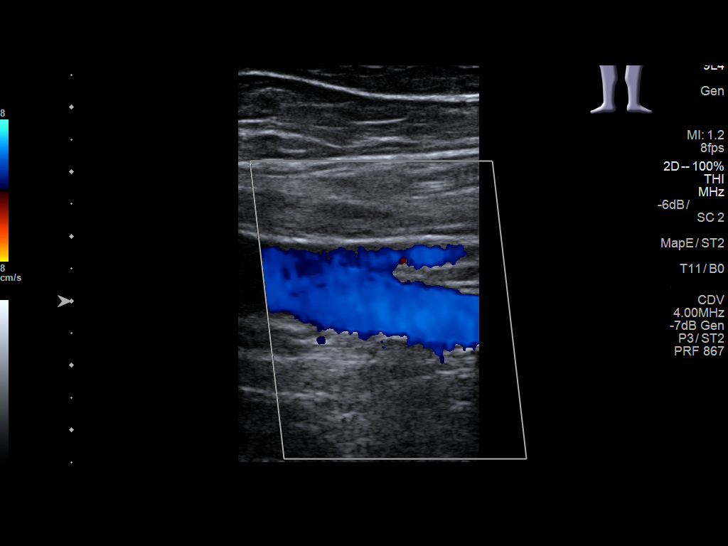
[im 43/59]
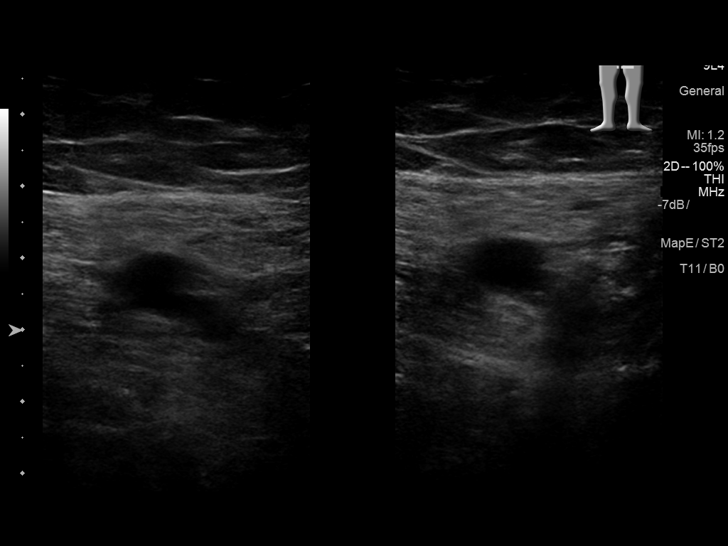
[im 48/59]
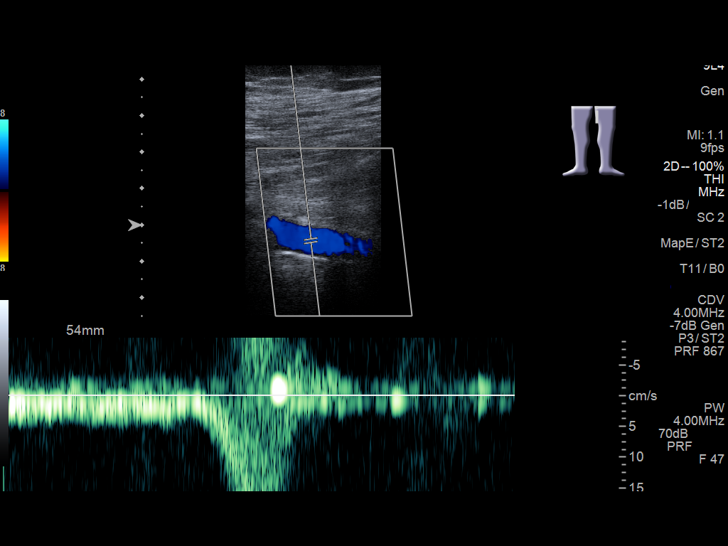
[im 53/59]
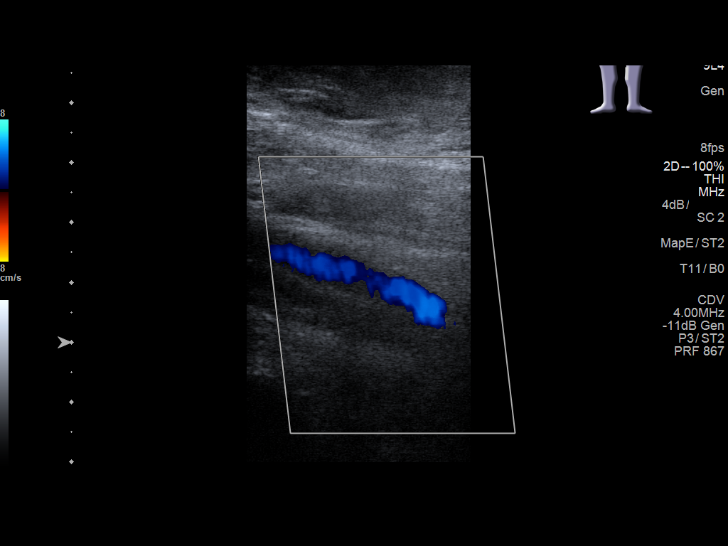
[im 59/59]
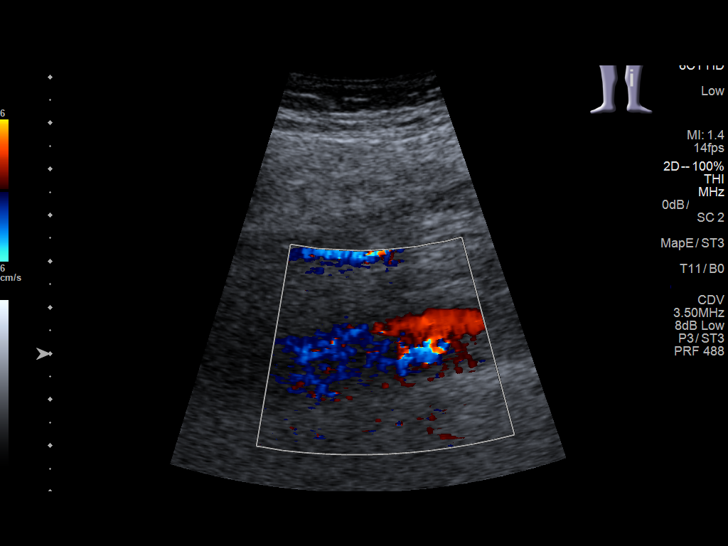

[13 of 24 positions shown; findings below may reference images not displayed]

FINDINGS: RIGHT LOWER EXTREMITY

Common Femoral Vein: No evidence of thrombus. Normal
compressibility, respiratory phasicity and response to augmentation.

Saphenofemoral Junction: No evidence of thrombus. Normal
compressibility and flow on color Doppler imaging.

Profunda Femoral Vein: No evidence of thrombus. Normal
compressibility and flow on color Doppler imaging.

Femoral Vein: No evidence of thrombus. Normal compressibility,
respiratory phasicity and response to augmentation.

Popliteal Vein: No evidence of thrombus. Normal compressibility,
respiratory phasicity and response to augmentation.

Calf Veins: Visualized right deep calf veins are patent without
thrombus.

LEFT LOWER EXTREMITY

Common Femoral Vein: No evidence of thrombus. Normal
compressibility, respiratory phasicity and response to augmentation.

Saphenofemoral Junction: No evidence of thrombus. Normal
compressibility and flow on color Doppler imaging.

Profunda Femoral Vein: No evidence of thrombus. Normal
compressibility and flow on color Doppler imaging.

Femoral Vein: No evidence of thrombus. Normal compressibility,
respiratory phasicity and response to augmentation.

Popliteal Vein: No evidence of thrombus. Normal compressibility,
respiratory phasicity and response to augmentation.

Calf Veins: Visualized left deep calf veins are patent without
thrombus.
IMPRESSION: No evidence of deep venous thrombosis in the lower extremities.

## 2017-04-26 NOTE — Progress Notes (Signed)
04/27/2017 9:37 AM   [name] 1941/02/18 [id number]  Referring provider: No referring provider defined for this encounter.  Chief Complaint  Patient presents with  . Nephrolithiasis    3 month follow up    HPI: Patient is a 76 year old Caucasian male who presents today for discussion regarding his right renal stones.    Background history Patient presented to [place] at the request of [facility] ED for Foley catheter removal.   Patient is a poor historian and the woman with him today, Diane, is just involved with transporting the patient and is not involved with the patients clinically.  Reviewing hospital records, he was having difficulty with voiding in 04/2016 and finasteride was added at that time.  He was on terazosin at the time of admission.  He was also complaining of constipation and was started on MiraLax.   A Foley was placed on 05/21/2016 and 3 liters of urine was drained from the bladder.  He was changed to tamsulosin and had terazosin discontinued.  Finasteride is continued as well.  He was also found to be C. Diff positive.  Patient was admitted to the hospital for UTI, sepsis, ARF and DVT.  He was discharged with the Foley.   He is unable to fill I PSS score or comment regarding his urination ability prior to the Foley catheter placement.  On 06/16/2016, his Foley was removed at that visit for a voiding trial.  He presented back to the office that afternoon in retention and the Foley was replaced.  The home health care team exchanged the Foley on 07/28/2016.    Foley was then removed on 09/13/2016.  One week later, he was seen in the ED for an unwitnessed fall.  He was found to have 900 cc of urine in his bladder. A Foley catheter was replaced and patient subsequently developed gross hematuria. He was admitted to the hospital and anticoagulation therapy was with held.  The hematuria cleared.    He has had several visits to the ED for syncope and hematuria.  At his ED visit  in May, it was recommended that he be admitted.  He refused admission.  A contrast CT performed at that time noted a nonobstructing right renal calculi measuring up to 1.3 cm in size which had increased slightly from a prior CT in 2016.    Today, his caregiver is concerned about urethral erosion due to the indwelling Foley and would like to discuss SPT placement.  He states it is painful to change his catheter.  His current serum creatinine is 1.19.  He denies any gross hematuria or suprapubic pain. He has not had any recent fevers, chills, nausea or vomiting. He has not had any flank pain.   PMH significant for CHF, COPD, DVT, seizure disorder, schizophrenia and Bipolar 1 disorder.    PMH: Past Medical History:  Diagnosis Date  . Bipolar 1 disorder (HCC)   . CHF (congestive heart failure) (HCC)   . COPD (chronic obstructive pulmonary disease) (HCC)   . DVT (deep venous thrombosis) (HCC)   . GERD (gastroesophageal reflux disease)   . High cholesterol   . Hypertension   . Schizophrenia (HCC)   . Seizure [facility])     Surgical History: Past Surgical History:  Procedure Laterality Date  . NO PAST SURGERIES      Home Medications:  Allergies as of 04/27/2017      Reactions   Penicillin G Other (See Comments)   Convulsions.  Has patient had a PCN reaction causing immediate rash, facial/tongue/throat swelling, SOB or lightheadedness with hypotension: not sure Has patient had a PCN reaction causing severe rash involving mucus membranes or skin necrosis: not sure Has patient had a PCN reaction that required hospitalization: not sure Has patient had a PCN reaction occurring within the last 10 years: not sure If all of the above answers are "NO", then may proceed with Cephalosporin use.      Medication List       Accurate as of 04/27/17  9:37 AM. Always use your most recent med list.          acetaminophen 325 MG tablet Commonly known as:  TYLENOL Take 2 tablets (650 mg total) by mouth  every 6 (six) hours as needed.   amLODipine 2.5 MG tablet Commonly known as:  NORVASC Take 1 tablet (2.5 mg total) by mouth daily.   buPROPion 300 MG 24 hr tablet Commonly known as:  BUDEPRION XL Take 1 tablet (300 mg total) by mouth daily.   carvedilol 25 MG tablet Commonly known as:  COREG Take 1 tablet (25 mg total) by mouth 2 (two) times daily.   cholecalciferol 1000 units tablet Commonly known as:  VITAMIN D Take 1,000 Units by mouth daily.   ciprofloxacin 500 MG tablet Commonly known as:  CIPRO Take 1 tablet (500 mg total) by mouth 2 (two) times daily. X 6 more days   feeding supplement (ENSURE ENLIVE) Liqd Take 237 mLs by mouth 2 (two) times daily between meals.   finasteride 5 MG tablet Commonly known as:  PROSCAR Take 1 tablet (5 mg total) by mouth daily.   furosemide 20 MG tablet Commonly known as:  LASIX Take 20 mg by mouth.   loperamide 2 MG tablet Commonly known as:  IMODIUM A-D Take 2-4 mg by mouth as needed for diarrhea or loose stools. 4 mg at first episode of diarrhea, then 2 mg for each subsequent episode as needed   omeprazole 20 MG capsule Commonly known as:  PRILOSEC Take 2 capsules (40 mg total) by mouth daily.   PHENobarbital 64.8 MG tablet Commonly known as:  LUMINAL Take 2 tablets (129.6 mg total) by mouth every other day.   polyethylene glycol packet Commonly known as:  MIRALAX / GLYCOLAX Take 17 g by mouth daily as needed for moderate constipation.   potassium chloride SA 20 MEQ tablet Commonly known as:  K-DUR,KLOR-CON Take 40 mEq by mouth 2 (two) times daily.   pravastatin 40 MG tablet Commonly known as:  PRAVACHOL Take 1 tablet (40 mg total) by mouth daily.   tamsulosin 0.4 MG Caps capsule Commonly known as:  FLOMAX Take 1 capsule (0.4 mg total) by mouth daily.   traMADol 50 MG tablet Commonly known as:  ULTRAM Take 1 tablet (50 mg total) by mouth every 6 (six) hours as needed for moderate pain or severe pain.   VENTOLIN  HFA 108 (90 Base) MCG/ACT inhaler Generic drug:  albuterol Inhale 2 puffs into the lungs every 6 (six) hours as needed for shortness of breath.       Allergies:  Allergies  Allergen Reactions  . Penicillin G Other (See Comments)    Convulsions. Has patient had a PCN reaction causing immediate rash, facial/tongue/throat swelling, SOB or lightheadedness with hypotension: not sure Has patient had a PCN reaction causing severe rash involving mucus membranes or skin necrosis: not sure Has patient had a PCN reaction that required hospitalization: not sure Has patient had  a PCN reaction occurring within the last 10 years: not sure If all of the above answers are "NO", then may proceed with Cephalosporin use.    Family History: Family History  Problem Relation Age of Onset  . Cancer Mother   . Prostate cancer Neg Hx   . Kidney disease Neg Hx   . Kidney cancer Neg Hx   . Bladder Cancer Neg Hx     Social History:  reports that he has never smoked. He has never used smokeless tobacco. He reports that he does not drink alcohol or use drugs.  ROS: UROLOGY Frequent Urination?: No Hard to postpone urination?: No Burning/pain with urination?: No Get up at night to urinate?: No Leakage of urine?: No Urine stream starts and stops?: No Trouble starting stream?: No Do you have to strain to urinate?: No Blood in urine?: No Urinary tract infection?: No Sexually transmitted disease?: No Injury to kidneys or bladder?: No Painful intercourse?: No Weak stream?: No Erection problems?: No Penile pain?: No  Gastrointestinal Nausea?: No Vomiting?: No Indigestion/heartburn?: No Diarrhea?: No Constipation?: No  Constitutional Fever: No Night sweats?: No Weight loss?: No Fatigue?: No  Skin Skin rash/lesions?: No Itching?: No  Eyes Blurred vision?: No Double vision?: No  Ears/Nose/Throat Sore throat?: No Sinus problems?: No  Hematologic/Lymphatic Swollen glands?: No Easy  bruising?: No  Cardiovascular Leg swelling?: Yes Chest pain?: No  Respiratory Cough?: No Shortness of breath?: No  Endocrine Excessive thirst?: No  Musculoskeletal Back pain?: No Joint pain?: No  Neurological Headaches?: No Dizziness?: No  Psychologic Depression?: No Anxiety?: No  Physical Exam: BP 117/74   Pulse 67   Ht  (1.778 m)   Wt 253 lb (114.8 kg)   BMI 36.30 kg/m   Constitutional: Well nourished. Alert and oriented, No acute distress. HEENT: [place] AT, moist mucus membranes. Trachea midline, no masses. Cardiovascular: No clubbing, cyanosis, or edema. Respiratory: Normal respiratory effort, no increased work of breathing. GU and Rectal could not performed due to patient's inability to stand.  I could examine the penis and it is eroded down through to the mid shaft.  Caregiver is concerned about his fall risk.  Skin: No rashes, bruises or suspicious lesions. Lymph: No cervical or inguinal adenopathy. Neurologic: Grossly intact, no focal deficits, moving all 4 extremities. Psychiatric: Normal mood and affect.   Laboratory Data: Lab Results  Component Value Date   WBC 4.4 12/25/2016   HGB 8.4 (L) 12/25/2016   HCT 25.1 (L) 12/25/2016   MCV 83.8 12/25/2016   PLT 209 12/25/2016    Lab Results  Component Value Date   CREATININE 1.19 12/25/2016    Lab Results  Component Value Date   AST 13 (L) 12/23/2016   Lab Results  Component Value Date   ALT 7 (L) 12/23/2016   I have reviewed the labs  Pertinent imaging  CLINICAL DATA:  Mid abdominal pain and cramping.  Syncope.  EXAM: CT ABDOMEN AND PELVIS WITH CONTRAST  TECHNIQUE: Multidetector CT imaging of the abdomen and pelvis was performed using the standard protocol following bolus administration of intravenous contrast.  CONTRAST:  75 mL Isovue-300  COMPARISON:  Right upper quadrant abdominal ultrasound 05/26/2016. CT abdomen and pelvis 02/21/2015.  FINDINGS: Lower chest:  Trace right and small left pleural effusions. Minimal bibasilar atelectasis. Normal heart size.  Hepatobiliary: No focal liver abnormality is seen. The gallbladder is grossly unremarkable, with assessment limited by underdistention and mild motion artifact. No significant biliary dilatation is seen.  Pancreas: Mild  diffuse atrophy without ductal dilatation. Mild stranding inferior to the pancreatic tail is not felt to be pancreatic in origin.  Spleen: Unremarkable.  Adrenals/Urinary Tract: Unremarkable adrenal glands. Nonobstructing right renal calculi measuring up to 1.3 cm in size, overall slightly increased from the prior CT. 5.1 cm right upper pole renal cyst has mildly enlarged as have smaller cysts in the right lower pole measuring up to 2 cm in size. Small cysts in the left kidney measure up to 2.2 cm. No hydronephrosis. Poor intravascular enhancement on the portal venous phase images with no significant contrast excretion on delayed images, possibly reflecting an issue with the contrast injection. Foley catheter and small amount of gas in the bladder.  Stomach/Bowel: The stomach is within normal limits. Oral contrast is present in multiple nondilated small bowel loops without evidence of obstruction. There is a moderate amount of stool in the ascending and transverse colon. The rectum and distal sigmoid colon are mildly distended by fluid with mild diffuse wall thickening and moderate surrounding inflammatory change. The more proximal sigmoid colon is very redundant and mildly to moderately distended by gas. There is mild inflammatory stranding which extends more superiorly throughout the mesenteric fat, more notable on the left with a trace amount of fluid at the level of the splenic flexure. The appendix is unremarkable.  Vascular/Lymphatic: Moderate aortoiliac atherosclerosis without aneurysm. No enlarged lymph nodes.  Reproductive:  Unremarkable  prostate.  Other: Moderate to prominent diffuse body wall edema.  Musculoskeletal: Thoracolumbar spondylosis. Moderate to advanced lumbar facet arthrosis.  IMPRESSION: 1. Wall thickening and surrounding inflammation involving the distal sigmoid colon and rectum compatible with proctocolitis. 2. Trace pleural effusions.  Diffuse body wall edema. 3. Nonobstructing right nephrolithiasis. 4.  Aortic Atherosclerosis (ICD10-I70.0).   Electronically Signed   By: Sebastian Ache M.D.   On: 12/23/2016 16:16    I have independently reviewed the films   Assessment & Plan:    Patient to undergo right ESWL for definitive treatment for a 1.3 cm right renal pelvic stone.    1. Urethral erosion  - patient would like to pursue a SPT placement at this time  - will be scheduled for CT guided SPT placement with serial dilations  2. Urinary retention:     - see above  - Will need cystoscopy in October 2018    3. BPH with LUTS   - Continue tamsulosin 0.4 mg daily and finasteride 5 mg daily   3. History of hematuria  - Resolved with antibiotics and discontinuation of Eliquis   4. Right renal stone  - will discuss with Dr. Apolinar Junes concerning further management  - I explained that ESWL is a means of pulverizing urinary stones without surgery using shockwave therapy  - I discussed the risks involved with ESWL consist of bruising to the skin and kidney region as a result of the shockwave, possibility of long-term kidney damage, development of high blood pressure and damage to the bowel or long, hematuria, urinary bleeding serious enough to require transfusion or surgical repair or removal of the kidney is rare, rare chance of hematoma formation in the kidney and injuries to the spleen, liver or pancreas.  There is also the risk of urinary tract infection or any infection of the blood system or tissue.   There is also a possibility of resultant damage to male organs.  - I explained that  sometimes the stone fragments can stack up in the ureter like coins, a phenomenon described as "Steinstrasse", that would  result in a stent placement and/or URS for further treatment  - I informed the patient that IV sedation is typically used on the truck, but it some rare instances we need to use general anesthesia - the risks being infection, irregular heart beat, irregular BP, stroke, MI, CVA, paralysis, coma and/or death.   Return for right ESWL.  These notes generated with voice recognition software. I apologize for typographical errors.  Michiel Cowboy, PA-C  [facility] [address], [address] [place], [place] [address] [phone]

## 2017-04-27 ENCOUNTER — Encounter: Payer: Self-pay | Admitting: Urology

## 2017-04-27 ENCOUNTER — Ambulatory Visit (INDEPENDENT_AMBULATORY_CARE_PROVIDER_SITE_OTHER): Payer: Medicare Other | Admitting: Urology

## 2017-04-27 ENCOUNTER — Other Ambulatory Visit: Payer: Self-pay | Admitting: Radiology

## 2017-04-27 VITALS — BP 117/74 | HR 67 | Ht 70.0 in | Wt 253.0 lb

## 2017-04-27 DIAGNOSIS — N401 Enlarged prostate with lower urinary tract symptoms: Secondary | ICD-10-CM

## 2017-04-27 DIAGNOSIS — R339 Retention of urine, unspecified: Secondary | ICD-10-CM

## 2017-04-27 DIAGNOSIS — N368 Other specified disorders of urethra: Secondary | ICD-10-CM | POA: Diagnosis not present

## 2017-04-27 DIAGNOSIS — Z87448 Personal history of other diseases of urinary system: Secondary | ICD-10-CM | POA: Diagnosis not present

## 2017-04-27 DIAGNOSIS — N200 Calculus of kidney: Secondary | ICD-10-CM | POA: Diagnosis not present

## 2017-04-27 DIAGNOSIS — N138 Other obstructive and reflux uropathy: Secondary | ICD-10-CM

## 2017-04-28 ENCOUNTER — Other Ambulatory Visit: Payer: Self-pay | Admitting: Radiology

## 2017-04-28 DIAGNOSIS — N200 Calculus of kidney: Secondary | ICD-10-CM

## 2017-05-04 ENCOUNTER — Encounter: Payer: Self-pay | Admitting: *Deleted

## 2017-05-05 ENCOUNTER — Encounter: Payer: Self-pay | Admitting: *Deleted

## 2017-05-05 ENCOUNTER — Other Ambulatory Visit: Payer: Self-pay | Admitting: Urology

## 2017-05-05 ENCOUNTER — Ambulatory Visit
Admission: RE | Admit: 2017-05-05 | Discharge: 2017-05-05 | Disposition: A | Discharge status: Home / Self Care | Payer: Medicare Other | Source: Ambulatory Visit | Attending: Urology | Admitting: Urology

## 2017-05-05 ENCOUNTER — Ambulatory Visit: Payer: Medicare Other

## 2017-05-05 ENCOUNTER — Encounter
Admission: RE | Disposition: A | Discharge status: Home / Self Care | Payer: Self-pay | Source: Ambulatory Visit | Attending: Urology

## 2017-05-05 DIAGNOSIS — Z79899 Other long term (current) drug therapy: Secondary | ICD-10-CM | POA: Insufficient documentation

## 2017-05-05 DIAGNOSIS — N368 Other specified disorders of urethra: Secondary | ICD-10-CM | POA: Insufficient documentation

## 2017-05-05 DIAGNOSIS — K219 Gastro-esophageal reflux disease without esophagitis: Secondary | ICD-10-CM | POA: Diagnosis not present

## 2017-05-05 DIAGNOSIS — Z86718 Personal history of other venous thrombosis and embolism: Secondary | ICD-10-CM | POA: Insufficient documentation

## 2017-05-05 DIAGNOSIS — Z880 Allergy status to penicillin: Secondary | ICD-10-CM | POA: Diagnosis not present

## 2017-05-05 DIAGNOSIS — N401 Enlarged prostate with lower urinary tract symptoms: Secondary | ICD-10-CM | POA: Insufficient documentation

## 2017-05-05 DIAGNOSIS — I110 Hypertensive heart disease with heart failure: Secondary | ICD-10-CM | POA: Diagnosis not present

## 2017-05-05 DIAGNOSIS — I509 Heart failure, unspecified: Secondary | ICD-10-CM | POA: Diagnosis not present

## 2017-05-05 DIAGNOSIS — J449 Chronic obstructive pulmonary disease, unspecified: Secondary | ICD-10-CM | POA: Insufficient documentation

## 2017-05-05 DIAGNOSIS — F319 Bipolar disorder, unspecified: Secondary | ICD-10-CM | POA: Diagnosis not present

## 2017-05-05 DIAGNOSIS — E7800 Pure hypercholesterolemia, unspecified: Secondary | ICD-10-CM | POA: Insufficient documentation

## 2017-05-05 DIAGNOSIS — G40909 Epilepsy, unspecified, not intractable, without status epilepticus: Secondary | ICD-10-CM | POA: Insufficient documentation

## 2017-05-05 DIAGNOSIS — N200 Calculus of kidney: Secondary | ICD-10-CM | POA: Insufficient documentation

## 2017-05-05 DIAGNOSIS — F209 Schizophrenia, unspecified: Secondary | ICD-10-CM | POA: Insufficient documentation

## 2017-05-05 HISTORY — DX: Retention of urine, unspecified: R33.9

## 2017-05-05 HISTORY — PX: EXTRACORPOREAL SHOCK WAVE LITHOTRIPSY: SHX1557

## 2017-05-05 HISTORY — DX: Carrier of other specified bacterial diseases: Z22.39

## 2017-05-05 HISTORY — DX: Hematuria, unspecified: R31.9

## 2017-05-05 HISTORY — DX: Other specified disorders of penis: N48.89

## 2017-05-05 HISTORY — DX: Chronic kidney disease, unspecified: N18.9

## 2017-05-05 HISTORY — DX: Enterocolitis due to Clostridium difficile, not specified as recurrent: A04.72

## 2017-05-05 SURGERY — LITHOTRIPSY, ESWL
Anesthesia: Moderate Sedation | Laterality: Right

## 2017-05-05 MED ORDER — SODIUM CHLORIDE 0.9 % IV SOLN
INTRAVENOUS | Status: DC
  Administered 2017-05-05: 07:00:00 via INTRAVENOUS

## 2017-05-05 MED ORDER — CIPROFLOXACIN HCL 500 MG PO TABS
500.0000 mg | ORAL_TABLET | ORAL | Status: AC
  Administered 2017-05-05: 500 mg via ORAL

## 2017-05-05 MED ORDER — CIPROFLOXACIN HCL 500 MG PO TABS
ORAL_TABLET | ORAL | Status: AC
  Filled 2017-05-05: qty 1

## 2017-05-05 MED ORDER — DIPHENHYDRAMINE HCL 25 MG PO CAPS
ORAL_CAPSULE | ORAL | Status: AC
  Filled 2017-05-05: qty 1

## 2017-05-05 MED ORDER — ONDANSETRON HCL 4 MG/2ML IJ SOLN
INTRAMUSCULAR | Status: AC
  Filled 2017-05-05: qty 2

## 2017-05-05 MED ORDER — DIPHENHYDRAMINE HCL 25 MG PO CAPS
25.0000 mg | ORAL_CAPSULE | ORAL | Status: AC
  Administered 2017-05-05: 25 mg via ORAL

## 2017-05-05 MED ORDER — DIAZEPAM 5 MG PO TABS
10.0000 mg | ORAL_TABLET | ORAL | Status: AC
  Administered 2017-05-05: 10 mg via ORAL

## 2017-05-05 MED ORDER — DIAZEPAM 5 MG PO TABS
ORAL_TABLET | ORAL | Status: AC
  Filled 2017-05-05: qty 2

## 2017-05-05 MED ORDER — ONDANSETRON HCL 4 MG/2ML IJ SOLN
4.0000 mg | Freq: Once | INTRAMUSCULAR | Status: AC
  Administered 2017-05-05: 4 mg via INTRAVENOUS

## 2017-05-05 MED ORDER — CIPROFLOXACIN HCL 500 MG PO TABS: 500.0000 mg | ORAL_TABLET | Freq: Two times a day (BID) | ORAL | 0 refills | Status: DC

## 2017-05-05 NOTE — Interval H&P Note (Signed)
History and Physical Interval Note:  05/05/2017 7:43 AM  [name]  has presented today for surgery, with the diagnosis of Kidney stone  The various methods of treatment have been discussed with the patient and family. After consideration of risks, benefits and other options for treatment, the patient has consented to  Procedure(s): EXTRACORPOREAL SHOCK WAVE LITHOTRIPSY (ESWL) (Right) as a surgical intervention .  The patient's history has been reviewed, patient examined, no change in status, stable for surgery.  I have reviewed the patient's chart and labs.  Questions were answered to the patient's satisfaction.    RRR CTAB  Vanna Scotland

## 2017-05-05 NOTE — H&P (View-Only) (Signed)
  04/27/2017 9:37 AM   [name] 11/05/1940 [id number]  Referring provider: No referring provider defined for this encounter.  Chief Complaint  Patient presents with  . Nephrolithiasis    3 month follow up    HPI: Patient is a 76 year old Caucasian male who presents today for discussion regarding his right renal stones.    Background history Patient presented to us at the request of [facility]'s ED for Foley catheter removal.   Patient is a poor historian and the woman with him today, Diane, is just involved with transporting the patient and is not involved with the patients clinically.  Reviewing hospital records, he was having difficulty with voiding in 04/2016 and finasteride was added at that time.  He was on terazosin at the time of admission.  He was also complaining of constipation and was started on MiraLax.   A Foley was placed on 05/21/2016 and 3 liters of urine was drained from the bladder.  He was changed to tamsulosin and had terazosin discontinued.  Finasteride is continued as well.  He was also found to be C. Diff positive.  Patient was admitted to the hospital for UTI, sepsis, ARF and DVT.  He was discharged with the Foley.   He is unable to fill I PSS score or comment regarding his urination ability prior to the Foley catheter placement.  On 06/16/2016, his Foley was removed at that visit for a voiding trial.  He presented back to the office that afternoon in retention and the Foley was replaced.  The home health care team exchanged the Foley on 07/28/2016.    Foley was then removed on 09/13/2016.  One week later, he was seen in the ED for an unwitnessed fall.  He was found to have 900 cc of urine in his bladder. A Foley catheter was replaced and patient subsequently developed gross hematuria. He was admitted to the hospital and anticoagulation therapy was with held.  The hematuria cleared.    He has had several visits to the ED for syncope and hematuria.  At his ED visit  in May, it was recommended that he be admitted.  He refused admission.  A contrast CT performed at that time noted a nonobstructing right renal calculi measuring up to 1.3 cm in size which had increased slightly from a prior CT in 2016.    Today, his caregiver is concerned about urethral erosion due to the indwelling Foley and would like to discuss SPT placement.  He states it is painful to change his catheter.  His current serum creatinine is 1.19.  He denies any gross hematuria or suprapubic pain. He has not had any recent fevers, chills, nausea or vomiting. He has not had any flank pain.   PMH significant for CHF, COPD, DVT, seizure disorder, schizophrenia and Bipolar 1 disorder.    PMH: Past Medical History:  Diagnosis Date  . Bipolar 1 disorder (HCC)   . CHF (congestive heart failure) (HCC)   . COPD (chronic obstructive pulmonary disease) (HCC)   . DVT (deep venous thrombosis) (HCC)   . GERD (gastroesophageal reflux disease)   . High cholesterol   . Hypertension   . Schizophrenia (HCC)   . Seizure (HCC)     Surgical History: Past Surgical History:  Procedure Laterality Date  . NO PAST SURGERIES      Home Medications:  Allergies as of 04/27/2017      Reactions   Penicillin G Other (See Comments)   Convulsions.   Has patient had a PCN reaction causing immediate rash, facial/tongue/throat swelling, SOB or lightheadedness with hypotension: not sure Has patient had a PCN reaction causing severe rash involving mucus membranes or skin necrosis: not sure Has patient had a PCN reaction that required hospitalization: not sure Has patient had a PCN reaction occurring within the last 10 years: not sure If all of the above answers are "NO", then may proceed with Cephalosporin use.      Medication List       Accurate as of 04/27/17  9:37 AM. Always use your most recent med list.          acetaminophen 325 MG tablet Commonly known as:  TYLENOL Take 2 tablets (650 mg total) by mouth  every 6 (six) hours as needed.   amLODipine 2.5 MG tablet Commonly known as:  NORVASC Take 1 tablet (2.5 mg total) by mouth daily.   buPROPion 300 MG 24 hr tablet Commonly known as:  BUDEPRION XL Take 1 tablet (300 mg total) by mouth daily.   carvedilol 25 MG tablet Commonly known as:  COREG Take 1 tablet (25 mg total) by mouth 2 (two) times daily.   cholecalciferol 1000 units tablet Commonly known as:  VITAMIN D Take 1,000 Units by mouth daily.   ciprofloxacin 500 MG tablet Commonly known as:  CIPRO Take 1 tablet (500 mg total) by mouth 2 (two) times daily. X 6 more days   feeding supplement (ENSURE ENLIVE) Liqd Take 237 mLs by mouth 2 (two) times daily between meals.   finasteride 5 MG tablet Commonly known as:  PROSCAR Take 1 tablet (5 mg total) by mouth daily.   furosemide 20 MG tablet Commonly known as:  LASIX Take 20 mg by mouth.   loperamide 2 MG tablet Commonly known as:  IMODIUM A-D Take 2-4 mg by mouth as needed for diarrhea or loose stools. 4 mg at first episode of diarrhea, then 2 mg for each subsequent episode as needed   omeprazole 20 MG capsule Commonly known as:  PRILOSEC Take 2 capsules (40 mg total) by mouth daily.   PHENobarbital 64.8 MG tablet Commonly known as:  LUMINAL Take 2 tablets (129.6 mg total) by mouth every other day.   polyethylene glycol packet Commonly known as:  MIRALAX / GLYCOLAX Take 17 g by mouth daily as needed for moderate constipation.   potassium chloride SA 20 MEQ tablet Commonly known as:  K-DUR,KLOR-CON Take 40 mEq by mouth 2 (two) times daily.   pravastatin 40 MG tablet Commonly known as:  PRAVACHOL Take 1 tablet (40 mg total) by mouth daily.   tamsulosin 0.4 MG Caps capsule Commonly known as:  FLOMAX Take 1 capsule (0.4 mg total) by mouth daily.   traMADol 50 MG tablet Commonly known as:  ULTRAM Take 1 tablet (50 mg total) by mouth every 6 (six) hours as needed for moderate pain or severe pain.   VENTOLIN  HFA 108 (90 Base) MCG/ACT inhaler Generic drug:  albuterol Inhale 2 puffs into the lungs every 6 (six) hours as needed for shortness of breath.       Allergies:  Allergies  Allergen Reactions  . Penicillin G Other (See Comments)    Convulsions. Has patient had a PCN reaction causing immediate rash, facial/tongue/throat swelling, SOB or lightheadedness with hypotension: not sure Has patient had a PCN reaction causing severe rash involving mucus membranes or skin necrosis: not sure Has patient had a PCN reaction that required hospitalization: not sure Has patient had   a PCN reaction occurring within the last 10 years: not sure If all of the above answers are "NO", then may proceed with Cephalosporin use.    Family History: Family History  Problem Relation Age of Onset  . Cancer Mother   . Prostate cancer Neg Hx   . Kidney disease Neg Hx   . Kidney cancer Neg Hx   . Bladder Cancer Neg Hx     Social History:  reports that he has never smoked. He has never used smokeless tobacco. He reports that he does not drink alcohol or use drugs.  ROS: UROLOGY Frequent Urination?: No Hard to postpone urination?: No Burning/pain with urination?: No Get up at night to urinate?: No Leakage of urine?: No Urine stream starts and stops?: No Trouble starting stream?: No Do you have to strain to urinate?: No Blood in urine?: No Urinary tract infection?: No Sexually transmitted disease?: No Injury to kidneys or bladder?: No Painful intercourse?: No Weak stream?: No Erection problems?: No Penile pain?: No  Gastrointestinal Nausea?: No Vomiting?: No Indigestion/heartburn?: No Diarrhea?: No Constipation?: No  Constitutional Fever: No Night sweats?: No Weight loss?: No Fatigue?: No  Skin Skin rash/lesions?: No Itching?: No  Eyes Blurred vision?: No Double vision?: No  Ears/Nose/Throat Sore throat?: No Sinus problems?: No  Hematologic/Lymphatic Swollen glands?: No Easy  bruising?: No  Cardiovascular Leg swelling?: Yes Chest pain?: No  Respiratory Cough?: No Shortness of breath?: No  Endocrine Excessive thirst?: No  Musculoskeletal Back pain?: No Joint pain?: No  Neurological Headaches?: No Dizziness?: No  Psychologic Depression?: No Anxiety?: No  Physical Exam: BP 117/74   Pulse 67   Ht 5' 10" (1.778 m)   Wt 253 lb (114.8 kg)   BMI 36.30 kg/m   Constitutional: Well nourished. Alert and oriented, No acute distress. HEENT: [place] AT, moist mucus membranes. Trachea midline, no masses. Cardiovascular: No clubbing, cyanosis, or edema. Respiratory: Normal respiratory effort, no increased work of breathing. GU and Rectal could not performed due to patient's inability to stand.  I could examine the penis and it is eroded down through to the mid shaft.  Caregiver is concerned about his fall risk.  Skin: No rashes, bruises or suspicious lesions. Lymph: No cervical or inguinal adenopathy. Neurologic: Grossly intact, no focal deficits, moving all 4 extremities. Psychiatric: Normal mood and affect.   Laboratory Data: Lab Results  Component Value Date   WBC 4.4 12/25/2016   HGB 8.4 (L) 12/25/2016   HCT 25.1 (L) 12/25/2016   MCV 83.8 12/25/2016   PLT 209 12/25/2016    Lab Results  Component Value Date   CREATININE 1.19 12/25/2016    Lab Results  Component Value Date   AST 13 (L) 12/23/2016   Lab Results  Component Value Date   ALT 7 (L) 12/23/2016   I have reviewed the labs  Pertinent imaging  CLINICAL DATA:  Mid abdominal pain and cramping.  Syncope.  EXAM: CT ABDOMEN AND PELVIS WITH CONTRAST  TECHNIQUE: Multidetector CT imaging of the abdomen and pelvis was performed using the standard protocol following bolus administration of intravenous contrast.  CONTRAST:  75 mL Isovue-300  COMPARISON:  Right upper quadrant abdominal ultrasound 05/26/2016. CT abdomen and pelvis 02/21/2015.  FINDINGS: Lower chest:  Trace right and small left pleural effusions. Minimal bibasilar atelectasis. Normal heart size.  Hepatobiliary: No focal liver abnormality is seen. The gallbladder is grossly unremarkable, with assessment limited by underdistention and mild motion artifact. No significant biliary dilatation is seen.  Pancreas: Mild   diffuse atrophy without ductal dilatation. Mild stranding inferior to the pancreatic tail is not felt to be pancreatic in origin.  Spleen: Unremarkable.  Adrenals/Urinary Tract: Unremarkable adrenal glands. Nonobstructing right renal calculi measuring up to 1.3 cm in size, overall slightly increased from the prior CT. 5.1 cm right upper pole renal cyst has mildly enlarged as have smaller cysts in the right lower pole measuring up to 2 cm in size. Small cysts in the left kidney measure up to 2.2 cm. No hydronephrosis. Poor intravascular enhancement on the portal venous phase images with no significant contrast excretion on delayed images, possibly reflecting an issue with the contrast injection. Foley catheter and small amount of gas in the bladder.  Stomach/Bowel: The stomach is within normal limits. Oral contrast is present in multiple nondilated small bowel loops without evidence of obstruction. There is a moderate amount of stool in the ascending and transverse colon. The rectum and distal sigmoid colon are mildly distended by fluid with mild diffuse wall thickening and moderate surrounding inflammatory change. The more proximal sigmoid colon is very redundant and mildly to moderately distended by gas. There is mild inflammatory stranding which extends more superiorly throughout the mesenteric fat, more notable on the left with a trace amount of fluid at the level of the splenic flexure. The appendix is unremarkable.  Vascular/Lymphatic: Moderate aortoiliac atherosclerosis without aneurysm. No enlarged lymph nodes.  Reproductive:  Unremarkable  prostate.  Other: Moderate to prominent diffuse body wall edema.  Musculoskeletal: Thoracolumbar spondylosis. Moderate to advanced lumbar facet arthrosis.  IMPRESSION: 1. Wall thickening and surrounding inflammation involving the distal sigmoid colon and rectum compatible with proctocolitis. 2. Trace pleural effusions.  Diffuse body wall edema. 3. Nonobstructing right nephrolithiasis. 4.  Aortic Atherosclerosis (ICD10-I70.0).   Electronically Signed   By: Allen  Grady M.D.   On: 12/23/2016 16:16    I have independently reviewed the films   Assessment & Plan:    Patient to undergo right ESWL for definitive treatment for a 1.3 cm right renal pelvic stone.    1. Urethral erosion  - patient would like to pursue a SPT placement at this time  - will be scheduled for CT guided SPT placement with serial dilations  2. Urinary retention:     - see above  - Will need cystoscopy in October 2018    3. BPH with LUTS   - Continue tamsulosin 0.4 mg daily and finasteride 5 mg daily   3. History of hematuria  - Resolved with antibiotics and discontinuation of Eliquis   4. Right renal stone  - will discuss with Dr. Brandon concerning further management  - I explained that ESWL is a means of pulverizing urinary stones without surgery using shockwave therapy  - I discussed the risks involved with ESWL consist of bruising to the skin and kidney region as a result of the shockwave, possibility of long-term kidney damage, development of high blood pressure and damage to the bowel or long, hematuria, urinary bleeding serious enough to require transfusion or surgical repair or removal of the kidney is rare, rare chance of hematoma formation in the kidney and injuries to the spleen, liver or pancreas.  There is also the risk of urinary tract infection or any infection of the blood system or tissue.   There is also a possibility of resultant damage to male organs.  - I explained that  sometimes the stone fragments can stack up in the ureter like coins, a phenomenon described as "Steinstrasse", that would   result in a stent placement and/or URS for further treatment  - I informed the patient that IV sedation is typically used on the truck, but it some rare instances we need to use general anesthesia - the risks being infection, irregular heart beat, irregular BP, stroke, MI, CVA, paralysis, coma and/or death.   Return for right ESWL.  These notes generated with voice recognition software. I apologize for typographical errors.  Aldona Bryner, PA-C  [facility] [address], [address] [place], [place] [address] [phone]  

## 2017-05-13 ENCOUNTER — Other Ambulatory Visit: Payer: Self-pay | Admitting: Radiology

## 2017-05-13 DIAGNOSIS — N368 Other specified disorders of urethra: Secondary | ICD-10-CM

## 2017-05-13 DIAGNOSIS — R339 Retention of urine, unspecified: Secondary | ICD-10-CM

## 2017-05-18 NOTE — Progress Notes (Signed)
05/19/2017 12:13 PM   [name] 05-Jan-1941 [id number]  Referring provider: No referring provider defined for this encounter.  Chief Complaint  Patient presents with  . Follow-up    2 week ESWL    HPI: Patient is a 76 year old Caucasian male who underwent ESWL for a right renal stone.  Patient with a finding of a right renal stone on CT in 12/2016 measuring 1.3 cm which was increased in size from prior CT in 2016.  Patient has a history of UTI with sepsis.    He underwent right ESWL on 05/05/2017 and the stone appeared to fragmented during the procedure.  Today, he is feeling fine. He is not having any urinary symptoms. He is not having flank pain or gross hematuria. He has passed 2 fragments and will bring them in either later date.    KUB taken today noted colonic distention consistent with colonic ileus. Colonic obstruction cannot be excluded. Stool noted throughout the colon.  Constipation cannot be excluded.   Right nephrolithiasis again noted.  Background history Patient presented to [place] at the request of [facility] ED for Foley catheter removal.   Patient is a poor historian and the woman with him today, Diane, is just involved with transporting the patient and is not involved with the patients clinically.  Reviewing hospital records, he was having difficulty with voiding in 04/2016 and finasteride was added at that time.  He was on terazosin at the time of admission.  He was also complaining of constipation and was started on MiraLax.   A Foley was placed on 05/21/2016 and 3 liters of urine was drained from the bladder.  He was changed to tamsulosin and had terazosin discontinued.  Finasteride is continued as well.  He was also found to be C. Diff positive.  Patient was admitted to the hospital for UTI, sepsis, ARF and DVT.  He was discharged with the Foley.   He is unable to fill I PSS score or comment regarding his urination ability prior to the Foley catheter placement.  On  06/16/2016, his Foley was removed at that visit for a voiding trial.  He presented back to the office that afternoon in retention and the Foley was replaced.  The home health care team exchanged the Foley on 07/28/2016.    Foley was then removed on 09/13/2016.  One week later, he was seen in the ED for an unwitnessed fall.  He was found to have 900 cc of urine in his bladder. A Foley catheter was replaced and patient subsequently developed gross hematuria. He was admitted to the hospital and anticoagulation therapy was with held.  The hematuria cleared.    He has had several visits to the ED for syncope and hematuria.  At his ED visit in May, it was recommended that he be admitted.  He refused admission.  A contrast CT performed at that time noted a nonobstructing right renal calculi measuring up to 1.3 cm in size which had increased slightly from a prior CT in 2016.    At his last visit,  his caregiver was concerned about urethral erosion due to the indwelling Foley and would like to discuss SPT placement.  He states it is painful to change his catheter.  His current serum creatinine is 1.19.    He denies any gross hematuria or suprapubic pain. He has not had any recent fevers, chills, nausea or vomiting. He has not had any flank pain.   PMH significant  for CHF, COPD, DVT, seizure disorder, schizophrenia and Bipolar 1 disorder.    PMH: Past Medical History:  Diagnosis Date  . Bipolar 1 disorder (HCC)   . C. difficile diarrhea 05/2016   history of ...  . CHF (congestive heart failure) (HCC)   . Chronic kidney disease 05/21/2016   acute renal failure with sepsis and uti  . COPD (chronic obstructive pulmonary disease) (HCC)   . DVT (deep venous thrombosis) (HCC)   . GERD (gastroesophageal reflux disease)   . Hematuria   . High cholesterol   . Hypertension   . 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 Schizophrenia (HCC)   . Seizure (HCC)    on phenobarb  . Urinary retention     frequently requiring foley placement  . VRE (vancomycin resistant enterococcus) culture positive    urine    Surgical History: Past Surgical History:  Procedure Laterality Date  . EXTRACORPOREAL SHOCK WAVE LITHOTRIPSY Right 05/05/2017   Procedure: EXTRACORPOREAL SHOCK WAVE LITHOTRIPSY (ESWL);  Surgeon: Vanna Scotland, MD;  Location: [facility] ORS;  Service: Urology;  Laterality: Right;    Home Medications:  Allergies as of 05/19/2017      Reactions   Penicillin G Rash, Other (See Comments)   Convulsions.    Has patient had a PCN reaction causing immediate rash, facial/tongue/throat swelling, SOB or lightheadedness with hypotension: not sure Has patient had a PCN reaction causing severe rash involving mucus membranes or skin necrosis: not sure Has patient had a PCN reaction that required hospitalization: not sure Has patient had a PCN reaction occurring within the last 10 years: not sure If all of the above answers are "NO", then may proceed with Cephalosporin use.      Medication List       Accurate as of 05/19/17 12:13 PM. Always use your most recent med list.          acetaminophen 325 MG tablet Commonly known as:  TYLENOL Take 2 tablets (650 mg total) by mouth every 6 (six) hours as needed.   amLODipine 2.5 MG tablet Commonly known as:  NORVASC Take 1 tablet (2.5 mg total) by mouth daily.   buPROPion 300 MG 24 hr tablet Commonly known as:  BUDEPRION XL Take 1 tablet (300 mg total) by mouth daily.   carvedilol 25 MG tablet Commonly known as:  COREG Take 1 tablet (25 mg total) by mouth 2 (two) times daily.   cholecalciferol 1000 units tablet Commonly known as:  VITAMIN D Take 1,000 Units by mouth daily.   ciprofloxacin 500 MG tablet Commonly known as:  CIPRO Take 1 tablet (500 mg total) by mouth 2 (two) times daily. X 6 more days   ciprofloxacin 500 MG tablet Commonly known as:  CIPRO Take 1 tablet (500 mg total) by mouth 2 (two) times daily.   feeding  supplement (ENSURE ENLIVE) Liqd Take 237 mLs by mouth 2 (two) times daily between meals.   finasteride 5 MG tablet Commonly known as:  PROSCAR Take 1 tablet (5 mg total) by mouth daily.   furosemide 20 MG tablet Commonly known as:  LASIX Take 20 mg by mouth.   loperamide 2 MG tablet Commonly known as:  IMODIUM A-D Take 2-4 mg by mouth as needed for diarrhea or loose stools. 4 mg at first episode of diarrhea, then 2 mg for each subsequent episode as needed   omeprazole 20 MG capsule Commonly known as:  PRILOSEC Take 2 capsules (40 mg total) by  mouth daily.   PHENobarbital 64.8 MG tablet Commonly known as:  LUMINAL Take 2 tablets (129.6 mg total) by mouth every other day.   polyethylene glycol packet Commonly known as:  MIRALAX / GLYCOLAX Take 17 g by mouth daily as needed for moderate constipation.   potassium chloride SA 20 MEQ tablet Commonly known as:  K-DUR,KLOR-CON Take 40 mEq by mouth 2 (two) times daily.   pravastatin 40 MG tablet Commonly known as:  PRAVACHOL Take 1 tablet (40 mg total) by mouth daily.   tamsulosin 0.4 MG Caps capsule Commonly known as:  FLOMAX Take 1 capsule (0.4 mg total) by mouth daily.   traMADol 50 MG tablet Commonly known as:  ULTRAM Take 1 tablet (50 mg total) by mouth every 6 (six) hours as needed for moderate pain or severe pain.   VENTOLIN HFA 108 (90 Base) MCG/ACT inhaler Generic drug:  albuterol Inhale 2 puffs into the lungs every 6 (six) hours as needed for shortness of breath.       Allergies:  Allergies  Allergen Reactions  . Penicillin G Rash and Other (See Comments)    Convulsions.    Has patient had a PCN reaction causing immediate rash, facial/tongue/throat swelling, SOB or lightheadedness with hypotension: not sure Has patient had a PCN reaction causing severe rash involving mucus membranes or skin necrosis: not sure Has patient had a PCN reaction that required hospitalization: not sure Has patient had a PCN reaction  occurring within the last 10 years: not sure If all of the above answers are "NO", then may proceed with Cephalosporin use.    Family History: Family History  Problem Relation Age of Onset  . Cancer Mother   . Prostate cancer Neg Hx   . Kidney disease Neg Hx   . Kidney cancer Neg Hx   . Bladder Cancer Neg Hx     Social History:  reports that he has never smoked. He has never used smokeless tobacco. He reports that he does not drink alcohol or use drugs.  ROS: UROLOGY Frequent Urination?: No Hard to postpone urination?: No Burning/pain with urination?: No Get up at night to urinate?: No Leakage of urine?: No Urine stream starts and stops?: No Trouble starting stream?: No Do you have to strain to urinate?: No Blood in urine?: No Urinary tract infection?: No Sexually transmitted disease?: No Injury to kidneys or bladder?: No Painful intercourse?: No Weak stream?: No Erection problems?: No Penile pain?: No  Gastrointestinal Nausea?: No Vomiting?: No Indigestion/heartburn?: No Diarrhea?: No Constipation?: No  Constitutional Fever: No Night sweats?: No Weight loss?: No Fatigue?: No  Skin Skin rash/lesions?: No Itching?: No  Eyes Blurred vision?: No Double vision?: No  Ears/Nose/Throat Sore throat?: No Sinus problems?: No  Hematologic/Lymphatic Swollen glands?: No Easy bruising?: No  Cardiovascular Leg swelling?: No Chest pain?: No  Respiratory Cough?: No Shortness of breath?: No  Endocrine Excessive thirst?: No  Musculoskeletal Back pain?: No Joint pain?: No  Neurological Headaches?: No Dizziness?: No  Psychologic Depression?: No Anxiety?: No  Physical Exam: BP 135/84   Pulse 64   Ht  (1.778 m)   Wt 253 lb (114.8 kg)   BMI 36.30 kg/m   Constitutional: Well nourished. Alert and oriented, No acute distress. HEENT: [place] AT, moist mucus membranes. Trachea midline, no masses. Cardiovascular: No clubbing, cyanosis, or  edema. Respiratory: Normal respiratory effort, no increased work of breathing. Skin: No rashes, bruises or suspicious lesions. Lymph: No cervical or inguinal adenopathy. Neurologic: Grossly intact, no  focal deficits, moving all 4 extremities. Psychiatric: Normal mood and affect.   Laboratory Data: Lab Results  Component Value Date   WBC 4.4 12/25/2016   HGB 8.4 (L) 12/25/2016   HCT 25.1 (L) 12/25/2016   MCV 83.8 12/25/2016   PLT 209 12/25/2016    Lab Results  Component Value Date   CREATININE 1.19 12/25/2016    Lab Results  Component Value Date   AST 13 (L) 12/23/2016   Lab Results  Component Value Date   ALT 7 (L) 12/23/2016   I have reviewed the labs  Pertinent imaging  CLINICAL DATA:  Kidney stone.  EXAM: ABDOMEN - 1 VIEW  COMPARISON:  05-26-2017.  FINDINGS: Soft tissue structures are unremarkable. Stool noted within the colon. Constipation cannot be excluded. The colon is distended consistent with colonic ileus. Distal colonic obstruction cannot be excluded. Follow-up exam suggested to demonstrate resolution. Right nephrolithiasis.Stable tiny sclerotic focus right ilium consistent with bone island. Degenerative changes lumbar spine and both hips. Catheter noted pelvis.  IMPRESSION: 1. Colonic distention consistent with colonic ileus. Colonic obstruction cannot be excluded. Stool noted throughout the colon. Constipation cannot be excluded.  2. Right nephrolithiasis again noted.   Electronically Signed   By: Maisie Fus  Register   On: 05/19/2017 10:02     I have independently reviewed the films   Assessment & Plan:    1. Right renal stone  - s/p right ESWL on 09/27/201   - Patient has passed a few fragments he will bring them in the week  - Stone fragments still present on x-ray will need follow-up x-ray in 1-2 weeks  - Patient will call to make appointment  2. Urethral erosion  - patient would like to pursue a SPT placement at this  time  - Currently scheduled for the 19th patient will call back to confirm  3. Urinary retention:     - see above  - Will need cystoscopy in October 2018    4. BPH with LUTS   - Continue tamsulosin 0.4 mg daily and finasteride 5 mg daily   5. History of hematuria  - Resolved with antibiotics and discontinuation of Eliquis  6. Colonic ileus  - Patient passing stool and not having abdominal discomfort  - Will recheck with x-ray in 1-2 weeks  - Patient advised that if he should have abdominal pain to contact our office or seek treatment to the emergency room      Return for patient to call.  These notes generated with voice recognition software. I apologize for typographical errors.  Michiel Cowboy, PA-C  [facility] [address], [address] [place], [place] [address] [phone]

## 2017-05-19 ENCOUNTER — Other Ambulatory Visit: Payer: Self-pay | Admitting: *Deleted

## 2017-05-19 ENCOUNTER — Encounter: Payer: Self-pay | Admitting: Urology

## 2017-05-19 ENCOUNTER — Ambulatory Visit
Admission: RE | Admit: 2017-05-19 | Discharge: 2017-05-19 | Disposition: A | Discharge status: Home / Self Care | Payer: Medicare Other | Source: Ambulatory Visit | Attending: Urology | Admitting: Urology

## 2017-05-19 ENCOUNTER — Ambulatory Visit (INDEPENDENT_AMBULATORY_CARE_PROVIDER_SITE_OTHER): Payer: Medicare Other | Admitting: Urology

## 2017-05-19 VITALS — BP 135/84 | HR 64 | Ht 70.0 in | Wt 253.0 lb

## 2017-05-19 DIAGNOSIS — N200 Calculus of kidney: Secondary | ICD-10-CM

## 2017-05-19 DIAGNOSIS — Z87448 Personal history of other diseases of urinary system: Secondary | ICD-10-CM

## 2017-05-19 DIAGNOSIS — K567 Ileus, unspecified: Secondary | ICD-10-CM | POA: Diagnosis not present

## 2017-05-19 DIAGNOSIS — N401 Enlarged prostate with lower urinary tract symptoms: Secondary | ICD-10-CM

## 2017-05-19 DIAGNOSIS — N368 Other specified disorders of urethra: Secondary | ICD-10-CM | POA: Diagnosis not present

## 2017-05-19 DIAGNOSIS — K6389 Other specified diseases of intestine: Secondary | ICD-10-CM | POA: Diagnosis not present

## 2017-05-19 DIAGNOSIS — N138 Other obstructive and reflux uropathy: Secondary | ICD-10-CM | POA: Diagnosis not present

## 2017-05-19 DIAGNOSIS — R339 Retention of urine, unspecified: Secondary | ICD-10-CM

## 2017-05-26 ENCOUNTER — Other Ambulatory Visit: Payer: Self-pay | Admitting: General Surgery

## 2017-05-27 ENCOUNTER — Ambulatory Visit
Admission: RE | Admit: 2017-05-27 | Discharge: 2017-05-27 | Disposition: A | Discharge status: Home / Self Care | Payer: Medicare Other | Source: Ambulatory Visit | Attending: Urology | Admitting: Urology

## 2017-05-27 DIAGNOSIS — F319 Bipolar disorder, unspecified: Secondary | ICD-10-CM | POA: Insufficient documentation

## 2017-05-27 DIAGNOSIS — N368 Other specified disorders of urethra: Secondary | ICD-10-CM | POA: Diagnosis not present

## 2017-05-27 DIAGNOSIS — Z880 Allergy status to penicillin: Secondary | ICD-10-CM | POA: Diagnosis not present

## 2017-05-27 DIAGNOSIS — J449 Chronic obstructive pulmonary disease, unspecified: Secondary | ICD-10-CM | POA: Insufficient documentation

## 2017-05-27 DIAGNOSIS — F209 Schizophrenia, unspecified: Secondary | ICD-10-CM | POA: Insufficient documentation

## 2017-05-27 DIAGNOSIS — Z86718 Personal history of other venous thrombosis and embolism: Secondary | ICD-10-CM | POA: Insufficient documentation

## 2017-05-27 DIAGNOSIS — N189 Chronic kidney disease, unspecified: Secondary | ICD-10-CM | POA: Diagnosis not present

## 2017-05-27 DIAGNOSIS — I509 Heart failure, unspecified: Secondary | ICD-10-CM | POA: Insufficient documentation

## 2017-05-27 DIAGNOSIS — K219 Gastro-esophageal reflux disease without esophagitis: Secondary | ICD-10-CM | POA: Insufficient documentation

## 2017-05-27 DIAGNOSIS — R339 Retention of urine, unspecified: Secondary | ICD-10-CM | POA: Diagnosis present

## 2017-05-27 DIAGNOSIS — E7800 Pure hypercholesterolemia, unspecified: Secondary | ICD-10-CM | POA: Diagnosis not present

## 2017-05-27 DIAGNOSIS — I130 Hypertensive heart and chronic kidney disease with heart failure and stage 1 through stage 4 chronic kidney disease, or unspecified chronic kidney disease: Secondary | ICD-10-CM | POA: Insufficient documentation

## 2017-05-27 HISTORY — DX: Personal history of urinary calculi: Z87.442

## 2017-05-27 LAB — CBC
HEMATOCRIT: 34.6 % — AB (ref 40.0–52.0)
Hemoglobin: 11.5 g/dL — ABNORMAL LOW (ref 13.0–18.0)
MCH: 28.4 pg (ref 26.0–34.0)
MCHC: 33.2 g/dL (ref 32.0–36.0)
MCV: 85.5 fL (ref 80.0–100.0)
Platelets: 215 10*3/uL (ref 150–440)
RBC: 4.04 MIL/uL — ABNORMAL LOW (ref 4.40–5.90)
RDW: 16.8 % — AB (ref 11.5–14.5)
WBC: 6.4 10*3/uL (ref 3.8–10.6)

## 2017-05-27 LAB — PROTIME-INR
INR: 1.07
Prothrombin Time: 13.8 seconds (ref 11.4–15.2)

## 2017-05-27 MED ORDER — SODIUM CHLORIDE FLUSH 0.9 % IV SOLN
INTRAVENOUS | Status: AC
  Filled 2017-05-27: qty 10

## 2017-05-27 MED ORDER — MIDAZOLAM HCL 5 MG/5ML IJ SOLN
INTRAMUSCULAR | Status: AC | PRN
  Administered 2017-05-27: 1 mg via INTRAVENOUS

## 2017-05-27 MED ORDER — MIDAZOLAM HCL 5 MG/5ML IJ SOLN
INTRAMUSCULAR | Status: AC
  Filled 2017-05-27: qty 5

## 2017-05-27 MED ORDER — FENTANYL CITRATE (PF) 100 MCG/2ML IJ SOLN
INTRAMUSCULAR | Status: AC | PRN
  Administered 2017-05-27 (×2): 25 ug via INTRAVENOUS

## 2017-05-27 MED ORDER — SODIUM CHLORIDE 0.9 % IV SOLN
INTRAVENOUS | Status: DC
  Administered 2017-05-27: 09:00:00 via INTRAVENOUS

## 2017-05-27 MED ORDER — FENTANYL CITRATE (PF) 100 MCG/2ML IJ SOLN
INTRAMUSCULAR | Status: AC
  Filled 2017-05-27: qty 4

## 2017-05-27 NOTE — Procedures (Signed)
Pre procedural Dx: Urinary retention Post procedural Dx: Same  Technically successful US and CT guided placed of a 12 Fr drainage catheter placement into the urinary bladder yielding clear urine.    EBL: None  Complications: None immediate  Katherina RightJay Darragh Nay, MD Pager #: [phone]

## 2017-05-27 NOTE — H&P (Signed)
Chief Complaint: Patient was seen in consultation today for suprapubic catheter placment at the request of McGowan,Shannon A  Referring Physician(s): McGowan,Shannon A  Supervising Physician: Simonne ComeWatts, John  Patient Status: [facility] - Out-pt  History of Present Illness: [name] is a 76 y.o. male with hx of chronic urinary retention requiring chronic foley catheter use. He has developed some urethral erosion. He is referred for percutaneous SP tube placement. PMHx, meds, labs, imaging, allergies reviewed. Has been NPO this am. Feels well, no recent fevers, chills, illness.   Past Medical History:  Diagnosis Date  . Bipolar 1 disorder (HCC)   . C. difficile diarrhea 05/2016   history of ...  . CHF (congestive heart failure) (HCC)   . Chronic kidney disease 05/21/2016   acute renal failure with sepsis and uti  . COPD (chronic obstructive pulmonary disease) (HCC)   . DVT (deep venous thrombosis) (HCC)   . GERD (gastroesophageal reflux disease)   . Hematuria   . High cholesterol   . History of kidney stones   . Hypertension   . 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 Schizophrenia (HCC)   . Seizure (HCC)    on phenobarb  . Urinary retention    frequently requiring foley placement  . VRE (vancomycin resistant enterococcus) culture positive    urine    Past Surgical History:  Procedure Laterality Date  . EXTRACORPOREAL SHOCK WAVE LITHOTRIPSY Right 05/05/2017   Procedure: EXTRACORPOREAL SHOCK WAVE LITHOTRIPSY (ESWL);  Surgeon: Vanna ScotlandBrandon, Ashley, MD;  Location: [facility] ORS;  Service: Urology;  Laterality: Right;    Allergies: Penicillin g  Medications: Prior to Admission medications   Medication Sig Start Date End Date Taking? Authorizing Provider  acetaminophen (TYLENOL) 325 MG tablet Take 2 tablets (650 mg total) by mouth every 6 (six) hours as needed. 04/26/16  Yes Wieting, Richard, MD  amLODipine (NORVASC) 2.5 MG tablet Take 1 tablet (2.5 mg total)  by mouth daily. 04/26/16  Yes Wieting, Richard, MD  buPROPion (BUDEPRION XL) 300 MG 24 hr tablet Take 1 tablet (300 mg total) by mouth daily. 04/26/16  Yes Wieting, Richard, MD  carvedilol (COREG) 25 MG tablet Take 1 tablet (25 mg total) by mouth 2 (two) times daily. 04/26/16  Yes Wieting, Richard, MD  cholecalciferol (VITAMIN D) 1000 units tablet Take 1,000 Units by mouth daily.   Yes [provider]  finasteride (PROSCAR) 5 MG tablet Take 1 tablet (5 mg total) by mouth daily. 04/27/16  Yes Wieting, Richard, MD  furosemide (LASIX) 20 MG tablet Take 20 mg by mouth.   Yes [provider]  omeprazole (PRILOSEC) 20 MG capsule Take 2 capsules (40 mg total) by mouth daily. Patient taking differently: Take 40 mg by mouth at bedtime.  04/26/16  Yes Wieting, Richard, MD  PHENobarbital (LUMINAL) 64.8 MG tablet Take 2 tablets (129.6 mg total) by mouth every other day. 04/26/16  Yes Wieting, Richard, MD  potassium chloride SA (K-DUR,KLOR-CON) 20 MEQ tablet Take 40 mEq by mouth 2 (two) times daily.    Yes [provider]  pravastatin (PRAVACHOL) 40 MG tablet Take 1 tablet (40 mg total) by mouth daily. 04/26/16  Yes Wieting, Richard, MD  tamsulosin (FLOMAX) 0.4 MG CAPS capsule Take 1 capsule (0.4 mg total) by mouth daily. 06/16/16  Yes McGowan, Carollee HerterShannon A, PA-C  traMADol (ULTRAM) 50 MG tablet Take 1 tablet (50 mg total) by mouth every 6 (six) hours as needed for moderate pain or severe pain. 09/24/16  Yes Gouru, Aruna, MD  albuterol (VENTOLIN HFA) 108 (90 Base) MCG/ACT inhaler Inhale 2 puffs into the lungs every 6 (six) hours as needed for shortness of breath.    [provider]  ciprofloxacin (CIPRO) 500 MG tablet Take 1 tablet (500 mg total) by mouth 2 (two) times daily. X 6 more days Patient not taking: Reported on 01/24/2017 12/25/16   Enid Baas, MD  ciprofloxacin (CIPRO) 500 MG tablet Take 1 tablet (500 mg total) by mouth 2 (two) times daily. Patient not taking: Reported on  05/19/2017 05/05/17   Vanna Scotland, MD  feeding supplement, ENSURE ENLIVE, (ENSURE ENLIVE) LIQD Take 237 mLs by mouth 2 (two) times daily between meals. Patient not taking: Reported on 01/24/2017 12/25/16   Enid Baas, MD  loperamide (IMODIUM A-D) 2 MG tablet Take 2-4 mg by mouth as needed for diarrhea or loose stools. 4 mg at first episode of diarrhea, then 2 mg for each subsequent episode as needed    [provider]  polyethylene glycol (MIRALAX / GLYCOLAX) packet Take 17 g by mouth daily as needed for moderate constipation. Patient not taking: Reported on 05/27/2017 04/26/16   Alford Highland, MD     Family History  Problem Relation Age of Onset  . Cancer Mother   . Prostate cancer Neg Hx   . Kidney disease Neg Hx   . Kidney cancer Neg Hx   . Bladder Cancer Neg Hx     Social History   Social History  . Marital status: Single    Spouse name: N/A  . Number of children: N/A  . Years of education: N/A   Social History Main Topics  . Smoking status: Never Smoker  . Smokeless tobacco: Never Used  . Alcohol use No  . Drug use: No  . Sexual activity: No   Other Topics Concern  . None   Social History Narrative  . None     Review of Systems: A 12 point ROS discussed and pertinent positives are indicated in the HPI above.  All other systems are negative.  Review of Systems  Vital Signs: BP (!) 142/90   Temp 97.6 F (36.4 C) (Oral)   Resp 20   SpO2 97%   Physical Exam  Constitutional: He is oriented to person, place, and time. He appears well-developed. No distress.  HENT:  Head: Normocephalic.  Mouth/Throat: Oropharynx is clear and moist.  Neck: Normal range of motion. No JVD present.  Cardiovascular: Normal rate, regular rhythm and normal heart sounds.   Pulmonary/Chest: Effort normal and breath sounds normal. No respiratory distress.  Abdominal: Soft. He exhibits no mass.  Neurological: He is alert and oriented to person, place, and time.    Skin: Skin is warm and dry.  Psychiatric: He has a normal mood and affect.    Imaging: Dg Abd 1 View  Result Date: 05/19/2017 CLINICAL DATA:  Kidney stone. EXAM: ABDOMEN - 1 VIEW COMPARISON:  05/05/2017. FINDINGS: Soft tissue structures are unremarkable. Stool noted within the colon. Constipation cannot be excluded. The colon is distended consistent with colonic ileus. Distal colonic obstruction cannot be excluded. Follow-up exam suggested to demonstrate resolution. Right nephrolithiasis.Stable tiny sclerotic focus right ilium consistent with bone island. Degenerative changes lumbar spine and both hips. Catheter noted pelvis. IMPRESSION: 1. Colonic distention consistent with colonic ileus. Colonic obstruction cannot be excluded. Stool noted throughout the colon. Constipation cannot be excluded. 2. Right nephrolithiasis again noted. Electronically Signed   By: Maisie Fus  Register   On: 05/19/2017  10:02   Dg Abd 1 View  Result Date: 05/05/2017 CLINICAL DATA:  History of right renal calculus EXAM: ABDOMEN - 1 VIEW COMPARISON:  CT abdomen pelvis 12/23/2016 FINDINGS: There is somewhat bowel gas and feces overlying the renal shadows that the CT demonstrated right renal calculus is very difficult to see. There may be 2 right renal calculi present although these are difficult visualize with certainty. Large amount of bowel gas is present particularly within the colon as noted on the prior CT. IMPRESSION: The CT demonstrated right renal calculi are very difficult to definitely visualize in view of the overlying bowel gas and feces. Electronically Signed   By: [name] Dee M.D.   On: 05/05/2017 07:59    Labs:  CBC:  Recent Labs  12/23/16 1447 12/24/16 0550 12/25/16 0559 05/27/17 0821  WBC 5.1 4.5 4.4 6.4  HGB 9.6* 8.2* 8.4* 11.5*  HCT 28.7* 24.7* 25.1* 34.6*  PLT 212 192 209 215    COAGS:  Recent Labs  11/18/16 0809 11/25/16 1341  INR 1.35 1.27  APTT  --  38*    BMP:  Recent Labs   11/25/16 1341 12/23/16 1447 12/24/16 0550 12/25/16 0559  NA 138 141 140 142  K 3.7 3.3* 2.8* 3.4*  CL 113* 111 111 112*  CO2 21* 25 25 26   GLUCOSE 143* 109* 85 91  BUN 17 14 14 14   CALCIUM 8.1* 8.5* 7.8* 8.0*  CREATININE 1.37*  1.40* 1.47* 1.36* 1.19  GFRNONAA 50*  48* 46* 50* 59*  GFRAA 57*  56* 53* 58* >60    LIVER FUNCTION TESTS:  Recent Labs  09/22/16 1024 11/18/16 0809 11/25/16 1341 12/23/16 1447  BILITOT 0.8 0.7 0.4 0.4  AST 37 16 16 13*  ALT 14* 7* 10* 7*  ALKPHOS 70 67 61 76  PROT 7.1 5.8* 6.2* 6.6  ALBUMIN 2.9* 2.5* 2.4* 2.8*    TUMOR MARKERS: No results for input(s): AFPTM, CEA, CA199, CHROMGRNA in the last 8760 hours.  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 Plan for image guided Suprapubic catheter placement. Labs reviewed. Risks and benefits discussed with the patient including bleeding, infection, damage to adjacent structures, bowel perforation/fistula connection, and sepsis. All of the patient's questions were answered, patient is agreeable to proceed. Consent signed and in chart.    Thank you for this interesting consult.  I greatly enjoyed meeting [name] and look forward to participating in their care.  A copy of this report was sent to the requesting provider on this date.  Electronically Signed: Brayton El, PA-C 05/27/2017, 8:49 AM   I spent a total of 25 minutes in face to face in clinical consultation, greater than 50% of which was counseling/coordinating care for SP tube placement

## 2017-06-08 ENCOUNTER — Other Ambulatory Visit: Payer: Self-pay | Admitting: Urology

## 2017-06-08 DIAGNOSIS — R339 Retention of urine, unspecified: Secondary | ICD-10-CM

## 2017-06-08 DIAGNOSIS — N368 Other specified disorders of urethra: Secondary | ICD-10-CM

## 2017-06-10 ENCOUNTER — Telehealth: Payer: Self-pay

## 2017-06-10 NOTE — Telephone Encounter (Signed)
Spoke w/Queen patient's caregiver and notified her that patient has been scheduled for next SP tube exchange on 06-23-17@2 :30 and to arrive at 1:30pm. She verbalized understanding

## 2017-06-10 NOTE — Telephone Encounter (Signed)
-----   Message from Harle BattiestShannon A McGowan, PA-C sent at 06/02/2017  8:21 AM EDT ----- Patient needs to have the up sizing of his SPT scheduled in 6 to 8 weeks.

## 2017-06-23 ENCOUNTER — Encounter: Payer: Self-pay | Admitting: Interventional Radiology

## 2017-06-23 ENCOUNTER — Ambulatory Visit
Admission: RE | Admit: 2017-06-23 | Discharge: 2017-06-23 | Disposition: A | Discharge status: Home / Self Care | Payer: Medicare Other | Source: Ambulatory Visit | Attending: Urology | Admitting: Urology

## 2017-06-23 DIAGNOSIS — N368 Other specified disorders of urethra: Secondary | ICD-10-CM

## 2017-06-23 DIAGNOSIS — R339 Retention of urine, unspecified: Secondary | ICD-10-CM | POA: Insufficient documentation

## 2017-06-23 DIAGNOSIS — Z436 Encounter for attention to other artificial openings of urinary tract: Secondary | ICD-10-CM | POA: Insufficient documentation

## 2017-06-23 HISTORY — PX: IR CATHETER TUBE CHANGE: IMG717

## 2017-06-23 MED ORDER — LIDOCAINE-EPINEPHRINE (PF) 1 %-1:200000 IJ SOLN
30.0000 mL | Freq: Once | INTRAMUSCULAR | Status: DC
  Filled 2017-06-23: qty 30

## 2017-06-23 MED ORDER — SODIUM CHLORIDE 0.9 % IV SOLN: INTRAVENOUS | Status: DC

## 2017-06-23 MED ORDER — IOPAMIDOL (ISOVUE-300) INJECTION 61%
30.0000 mL | Freq: Once | INTRAVENOUS | Status: AC | PRN
  Administered 2017-06-23: 15:00:00 10 mL

## 2017-06-23 MED ORDER — LIDOCAINE-EPINEPHRINE (PF) 1 %-1:200000 IJ SOLN
INTRAMUSCULAR | Status: AC
  Filled 2017-06-23: qty 30

## 2017-06-23 NOTE — Procedures (Signed)
Pre procedural Dx: Urinary retention Post procedural Dx: Same  Technically successful fluro guided exchanged and upsizing of now 16 Fr supra-pubic catheter.  EBL: None  Complications: None immediate  Katherina RightJay Yonathan Perrow, MD Pager #: [phone]

## 2017-06-28 ENCOUNTER — Encounter: Payer: Self-pay | Admitting: *Deleted

## 2017-06-28 ENCOUNTER — Emergency Department
Admission: EM | Admit: 2017-06-28 | Discharge: 2017-06-28 | Disposition: A | Discharge status: Nursing Home (Includes ICF) | Payer: Medicare Other | Attending: Emergency Medicine | Admitting: Emergency Medicine

## 2017-06-28 ENCOUNTER — Telehealth: Payer: Self-pay

## 2017-06-28 ENCOUNTER — Ambulatory Visit
Admit: 2017-06-28 | Discharge: 2017-06-28 | Disposition: A | Discharge status: Home / Self Care | Payer: Medicare Other | Attending: Diagnostic Radiology | Admitting: Diagnostic Radiology

## 2017-06-28 DIAGNOSIS — T83010A Breakdown (mechanical) of cystostomy catheter, initial encounter: Secondary | ICD-10-CM

## 2017-06-28 DIAGNOSIS — N99512 Cystostomy malfunction: Secondary | ICD-10-CM | POA: Insufficient documentation

## 2017-06-28 DIAGNOSIS — I509 Heart failure, unspecified: Secondary | ICD-10-CM | POA: Diagnosis not present

## 2017-06-28 DIAGNOSIS — Z79899 Other long term (current) drug therapy: Secondary | ICD-10-CM | POA: Insufficient documentation

## 2017-06-28 DIAGNOSIS — I130 Hypertensive heart and chronic kidney disease with heart failure and stage 1 through stage 4 chronic kidney disease, or unspecified chronic kidney disease: Secondary | ICD-10-CM | POA: Diagnosis not present

## 2017-06-28 DIAGNOSIS — N189 Chronic kidney disease, unspecified: Secondary | ICD-10-CM | POA: Diagnosis not present

## 2017-06-28 DIAGNOSIS — J449 Chronic obstructive pulmonary disease, unspecified: Secondary | ICD-10-CM | POA: Diagnosis not present

## 2017-06-28 DIAGNOSIS — N39498 Other specified urinary incontinence: Secondary | ICD-10-CM

## 2017-06-28 HISTORY — PX: IR CATHETER TUBE CHANGE: IMG717

## 2017-06-28 SURGERY — Surgical Case
Anesthesia: *Unknown

## 2017-06-28 MED ORDER — LIDOCAINE HCL (PF) 1 % IJ SOLN
INTRAMUSCULAR | Status: AC
  Filled 2017-06-28: qty 30

## 2017-06-28 MED ORDER — LIDOCAINE HCL 2 % EX GEL
CUTANEOUS | Status: DC
Start: 2017-06-28 — End: 2017-06-28
  Filled 2017-06-28: qty 10

## 2017-06-28 MED ORDER — IOPAMIDOL (ISOVUE-300) INJECTION 61%: 30.0000 mL | Freq: Once | INTRAVENOUS | Status: DC | PRN

## 2017-06-28 NOTE — Telephone Encounter (Signed)
Pt caregiver left message on triage line that pt SPT has completely come out. Attempted to call caregiver back and did not get an answer.

## 2017-06-28 NOTE — ED Notes (Signed)
Dr. Darnelle CatalanMalinda attempted to place suprapubic in hole, unable to.

## 2017-06-28 NOTE — ED Provider Notes (Signed)
Patient return to ER after IR placement his suprapubic catheter.  Catheter appears to be working normally.  He is cleared for discharge.   Emily FilbertWilliams, Jonathan E, MD 06/28/17 [phone]

## 2017-06-28 NOTE — ED Notes (Signed)
Per Dr. Shaune PollackLord no new orders at this time

## 2017-06-28 NOTE — Consult Note (Signed)
Chief Complaint: Patient was seen in consultation today for suprapubic catheter dislodgement  Referring Physician(s): Dr. Dorothea Glassman   Patient Status: [name] - ED  History of Present Illness: [name] is a 76 y.o. male with history of urinary retention.  He recently had his suprapubic catheter changed but now the catheter is completely dislodged.  Patient is complaining of abdominal pain from the urinary retention. He has not eaten today.    Past Medical History:  Diagnosis Date  . Bipolar 1 disorder (HCC)   . C. difficile diarrhea 05/2016   history of ...  . CHF (congestive heart failure) (HCC)   . Chronic kidney disease 05/21/2016   acute renal failure with sepsis and uti  . COPD (chronic obstructive pulmonary disease) (HCC)   . DVT (deep venous thrombosis) (HCC)   . GERD (gastroesophageal reflux disease)   . Hematuria   . High cholesterol   . History of kidney stones   . Hypertension   . 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 Schizophrenia (HCC)   . Seizure (HCC)    on phenobarb  . Urinary retention    frequently requiring foley placement  . VRE (vancomycin resistant enterococcus) culture positive    urine    Past Surgical History:  Procedure Laterality Date  . EXTRACORPOREAL SHOCK WAVE LITHOTRIPSY Right 05/05/2017   Procedure: EXTRACORPOREAL SHOCK WAVE LITHOTRIPSY (ESWL);  Surgeon: Vanna Scotland, MD;  Location: [facility] ORS;  Service: Urology;  Laterality: Right;  . IR CATHETER TUBE CHANGE  06/23/2017    Allergies: Penicillin g  Medications: Prior to Admission medications   Medication Sig Start Date End Date Taking? Authorizing Provider  acetaminophen (TYLENOL) 325 MG tablet Take 2 tablets (650 mg total) by mouth every 6 (six) hours as needed. 04/26/16   Alford Highland, MD  albuterol (VENTOLIN HFA) 108 (90 Base) MCG/ACT inhaler Inhale 2 puffs into the lungs every 6 (six) hours as needed for shortness of breath.    [provider]  amLODipine (NORVASC) 2.5 MG tablet Take 1 tablet (2.5 mg total) by mouth daily. 04/26/16   Alford Highland, MD  buPROPion (BUDEPRION XL) 300 MG 24 hr tablet Take 1 tablet (300 mg total) by mouth daily. 04/26/16   Alford Highland, MD  carvedilol (COREG) 25 MG tablet Take 1 tablet (25 mg total) by mouth 2 (two) times daily. 04/26/16   Alford Highland, MD  cholecalciferol (VITAMIN D) 1000 units tablet Take 1,000 Units by mouth daily.    [provider]  ciprofloxacin (CIPRO) 500 MG tablet Take 1 tablet (500 mg total) by mouth 2 (two) times daily. X 6 more days Patient not taking: Reported on 01/24/2017 12/25/16   Enid Baas, MD  ciprofloxacin (CIPRO) 500 MG tablet Take 1 tablet (500 mg total) by mouth 2 (two) times daily. Patient not taking: Reported on 05/19/2017 05/05/17   Vanna Scotland, MD  feeding supplement, ENSURE ENLIVE, (ENSURE ENLIVE) LIQD Take 237 mLs by mouth 2 (two) times daily between meals. Patient not taking: Reported on 01/24/2017 12/25/16   Enid Baas, MD  finasteride (PROSCAR) 5 MG tablet Take 1 tablet (5 mg total) by mouth daily. 04/27/16   Alford Highland, MD  furosemide (LASIX) 20 MG tablet Take 20 mg by mouth.    [provider]  loperamide (IMODIUM A-D) 2 MG tablet Take 2-4 mg by mouth as needed for diarrhea or loose stools. 4 mg at first episode of diarrhea, then 2 mg for each subsequent  episode as needed    [provider]  omeprazole (PRILOSEC) 20 MG capsule Take 2 capsules (40 mg total) by mouth daily. Patient taking differently: Take 40 mg by mouth at bedtime.  04/26/16   Alford HighlandWieting, Richard, MD  PHENobarbital (LUMINAL) 64.8 MG tablet Take 2 tablets (129.6 mg total) by mouth every other day. 04/26/16   Alford HighlandWieting, Richard, MD  polyethylene glycol (MIRALAX / Ethelene HalGLYCOLAX) packet Take 17 g by mouth daily as needed for moderate constipation. Patient not taking: Reported on 05/27/2017 04/26/16   Alford HighlandWieting, Richard, MD  potassium  chloride SA (K-DUR,KLOR-CON) 20 MEQ tablet Take 40 mEq by mouth 2 (two) times daily.     [provider]  pravastatin (PRAVACHOL) 40 MG tablet Take 1 tablet (40 mg total) by mouth daily. 04/26/16   Alford HighlandWieting, Richard, MD  tamsulosin (FLOMAX) 0.4 MG CAPS capsule Take 1 capsule (0.4 mg total) by mouth daily. 06/16/16   McGowan, Carollee HerterShannon A, PA-C  traMADol (ULTRAM) 50 MG tablet Take 1 tablet (50 mg total) by mouth every 6 (six) hours as needed for moderate pain or severe pain. 09/24/16   Ramonita LabGouru, Aruna, MD     Family History  Problem Relation Age of Onset  . Cancer Mother   . Prostate cancer Neg Hx   . Kidney disease Neg Hx   . Kidney cancer Neg Hx   . Bladder Cancer Neg Hx     Social History   Socioeconomic History  . Marital status: Single    Spouse name: None  . Number of children: None  . Years of education: None  . Highest education level: None  Social Needs  . Financial resource strain: None  . Food insecurity - worry: None  . Food insecurity - inability: None  . Transportation needs - medical: None  . Transportation needs - non-medical: None  Occupational History  . None  Tobacco Use  . Smoking status: Never Smoker  . Smokeless tobacco: Never Used  Substance and Sexual Activity  . Alcohol use: No  . Drug use: No  . Sexual activity: No  Other Topics Concern  . None  Social History Narrative  . None      Review of Systems  Genitourinary:       Urinary retention    Vital Signs: BP (!) 166/85   Pulse 71   Temp 99.1 F (37.3 C) (Oral)   Resp 16   Ht 5\' 10"  (1.778 m)   Wt 253 lb (114.8 kg)   SpO2 100%   BMI 36.30 kg/m   Physical Exam  Cardiovascular: Normal rate.  Murmur heard. Pulmonary/Chest: Effort normal and breath sounds normal. No respiratory distress.  Abdominal: He exhibits distension.  Suprapubic catheter site remains open, no discharge    Imaging: Ir Catheter Tube Change  Result Date: 06/23/2017 INDICATION: Chronic inferior retention,  post ultrasound and CT-guided placement of a suprapubic drainage catheter on 05/27/2017. Patient presents today for fluoroscopic guided suprapubic catheter exchange and up sizing. EXAM: FLUOROSCOPIC GUIDED SUPRAPUBIC CATHETER EXCHANGE AND CONVERSION COMPARISON:  Ultrasound and CT-guided suprapubic drainage catheter placement - 05/27/2017 MEDICATIONS: None ANESTHESIA/SEDATION: None CONTRAST:  None FLUOROSCOPY TIME:  30 seconds (9 mGy) COMPLICATIONS: None immediate. PROCEDURE: The procedure, risks, benefits, and alternatives were explained to the patient. Questions regarding the procedure were encouraged and answered. The patient understands and consents to the procedure. A timeout was performed prior to the initiation of the procedure. The external portion of the existing suprapubic catheter as well as the surrounding skin were  prepped and draped in usual sterile fashion A preprocedural spot fluoroscopic image was obtained of the existing 12 [place] all-purpose drainage catheter with end coiled and locked overlying expected location of the urinary bladder. Contrast injection confirmed appropriate positioning and functionality existing suprapubic catheter. The external portion of the existing suprapubic catheter was cut and cannulated with a short Amplatz wire which was coiled within the urinary bladder. Under intermittent fluoroscopic guidance, the existing 12 [place] all-purpose drainage catheter was exchanged for a new 16 [place] all-purpose drainage catheter with end coiled and locked within urinary bladder. Appropriate position was confirmed with the injection of a small amount of contrast as well as the efflux of urine. The catheter was connected to a gravity bag. A dressing was placed. The patient tolerated the procedure well without immediate postprocedural complication. FINDINGS: Preprocedural imaging demonstrates unchanged positioning of all-purpose drainage catheter with end coiled and locked over the expected  location the urinary bladder. Contrast injection confirms appropriate positioning and functionality of the existing percutaneous drainage catheter. After fluoroscopic guided exchange, the new, larger not 16 [place] all-purpose drainage catheter is appropriate positioned with end coiled and locked within the urinary bladder. IMPRESSION: Technically successful fluoroscopic guided exchange and up sizing of now a 16 [place] all-purpose drainage catheter for the purposes of chronic urinary bladder decompression. PLAN: This 12 [place] all-purpose drainage catheter may be exchanged in office, alternatively, the all-purpose drainage catheter could be further exchanged, upsized and converted to a balloon retention gastrostomy under fluoroscopic guidance as deemed appropriate by the referring urologic service. Electronically Signed   By: Simonne Come M.D.   On: 06/23/2017 15:49    Labs:  CBC: Recent Labs    12/23/16 1447 12/24/16 0550 12/25/16 0559 05/27/17 0821  WBC 5.1 4.5 4.4 6.4  HGB 9.6* 8.2* 8.4* 11.5*  HCT 28.7* 24.7* 25.1* 34.6*  PLT 212 192 209 215    COAGS: Recent Labs    11/18/16 0809 11/25/16 1341 05/27/17 0821  INR 1.35 1.27 1.07  APTT  --  38*  --     BMP: Recent Labs    11/25/16 1341 12/23/16 1447 12/24/16 0550 12/25/16 0559  NA 138 141 140 142  K 3.7 3.3* 2.8* 3.4*  CL 113* 111 111 112*  CO2 21* 25 25 26   GLUCOSE 143* 109* 85 91  BUN 17 14 14 14   CALCIUM 8.1* 8.5* 7.8* 8.0*  CREATININE 1.37*  1.40* 1.47* 1.36* 1.19  GFRNONAA 50*  48* 46* 50* 59*  GFRAA 57*  56* 53* 58* >60    LIVER FUNCTION TESTS: Recent Labs    09/22/16 1024 11/18/16 0809 11/25/16 1341 12/23/16 1447  BILITOT 0.8 0.7 0.4 0.4  AST 37 16 16 13*  ALT 14* 7* 10* 7*  ALKPHOS 70 67 61 76  PROT 7.1 5.8* 6.2* 6.6  ALBUMIN 2.9* 2.5* 2.4* 2.8*    TUMOR MARKERS: No results for input(s): AFPTM, CEA, CA199, CHROMGRNA in the last 8760 hours.  Assessment and Plan:  76 yo with a dislodged  suprapubic catheter.  Will try to replace catheter with fluoroscopic guidance.  If unable to get thru old tract, may need to re-stick in CT.    Thank you for this interesting consult.  I greatly enjoyed meeting [name] and look forward to participating in their care.  A copy of this report was sent to the requesting provider on this date.  Electronically Signed: Arn Medal, MD 06/28/2017, 3:54 PM   I spent a total of  10 Minutes in face to face in clinical consultation, greater than 50% of which was counseling/coordinating care for suprapubic tube management.

## 2017-06-28 NOTE — ED Notes (Signed)
Report to Jasmine DecemberSharon, [facility].

## 2017-06-28 NOTE — ED Notes (Signed)
Report to [facility].

## 2017-06-28 NOTE — Procedures (Signed)
Successful replacement of the suprapubic catheter with fluoroscopy.   A 16 Fr balloon retention catheter was placed.  Minimal blood loss and no immediate complication.

## 2017-06-28 NOTE — ED Provider Notes (Signed)
[facility] Emergency Department Provider Note   ____________________________________________   First MD Initiated Contact with Patient 06/28/17 1328     (approximate)  I have reviewed the triage vital signs and the nursing notes.   HISTORY  Chief Complaint Other   HPI [name] is a 76 y.o. male patient had a pubic catheter put in 3 days ago by interventional radiology. Today he woke up in bed and the catheter was laying in the bed next to him.  Past Medical History:  Diagnosis Date  . Bipolar 1 disorder (HCC)   . C. difficile diarrhea 05/2016   history of ...  . CHF (congestive heart failure) (HCC)   . Chronic kidney disease 05/21/2016   acute renal failure with sepsis and uti  . COPD (chronic obstructive pulmonary disease) (HCC)   . DVT (deep venous thrombosis) (HCC)   . GERD (gastroesophageal reflux disease)   . Hematuria   . High cholesterol   . History of kidney stones   . Hypertension   . 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 Schizophrenia (HCC)   . Seizure (HCC)    on phenobarb  . Urinary retention    frequently requiring foley placement  . VRE (vancomycin resistant enterococcus) culture positive    urine    Patient Active Problem List   Diagnosis Date Noted  . Proctocolitis 12/23/2016  . Acute posthemorrhagic anemia 11/25/2016  . Hematuria 11/25/2016  . Guaiac positive stools 11/25/2016  . Acute renal insufficiency 11/25/2016  . Pressure injury of skin 09/23/2016  . Back pain 05/28/2016  . Sepsis (HCC) 05/27/2016  . UTI (urinary tract infection) 05/27/2016  . Escherichia coli (E. coli) infection 05/27/2016  . DVT of lower extremity, bilateral (HCC) 05/27/2016  . 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 BPH (benign prostatic hyperplasia) 05/27/2016  . Acute on chronic renal failure (HCC) 05/27/2016  . Bipolar disorder (HCC) 05/27/2016  . Generalized weakness 05/27/2016  . 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 C. difficile colitis 05/27/2016  . Acute kidney injury (HCC) 05/21/2016  . Acute lower UTI 04/23/2016  . 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 Pneumonia 02/21/2015  . ARF (acute renal failure) (HCC) 02/21/2015  . Hypokalemia 02/21/2015  . Leukocytosis 02/21/2015  . Venous insufficiency of both lower extremities 12/04/2014  . Benign essential hypertension 11/25/2014  . Bilateral carotid artery stenosis 11/15/2014  . Bilateral leg edema 11/15/2014  . COPD (chronic obstructive pulmonary disease) (HCC) 11/15/2014  . Gastroesophageal reflux disease 11/15/2014  . Mild aortic valve stenosis 11/15/2014  . Mixed hyperlipidemia 11/15/2014  . Obesity 11/15/2014  . Seizure disorder (HCC) 11/15/2014    Past Surgical History:  Procedure Laterality Date  . EXTRACORPOREAL SHOCK WAVE LITHOTRIPSY Right 05/05/2017   Procedure: EXTRACORPOREAL SHOCK WAVE LITHOTRIPSY (ESWL);  Surgeon: Vanna Scotland, MD;  Location: [facility] ORS;  Service: Urology;  Laterality: Right;  . IR CATHETER TUBE CHANGE  06/23/2017    Prior to Admission medications   Medication Sig Start Date End Date Taking? Authorizing Provider  acetaminophen (TYLENOL) 325 MG tablet Take 2 tablets (650 mg total) by mouth every 6 (six) hours as needed. 04/26/16   Alford Highland, MD  albuterol (VENTOLIN HFA) 108 (90 Base) MCG/ACT inhaler Inhale 2 puffs into the lungs every 6 (six) hours as needed for shortness of breath.    [provider]  amLODipine (NORVASC) 2.5 MG tablet Take 1 tablet (2.5 mg  total) by mouth daily. 04/26/16   Alford HighlandWieting, Richard, MD  buPROPion (BUDEPRION XL) 300 MG 24 hr tablet Take 1 tablet (300 mg total) by mouth daily. 04/26/16   Alford HighlandWieting, Richard, MD  carvedilol (COREG) 25 MG tablet Take 1 tablet (25 mg total) by mouth 2 (two) times daily. 04/26/16   Alford HighlandWieting, Richard, MD  cholecalciferol (VITAMIN D) 1000 units tablet Take 1,000 Units by mouth daily.     [provider]  ciprofloxacin (CIPRO) 500 MG tablet Take 1 tablet (500 mg total) by mouth 2 (two) times daily. X 6 more days Patient not taking: Reported on 01/24/2017 12/25/16   Enid BaasKalisetti, Radhika, MD  ciprofloxacin (CIPRO) 500 MG tablet Take 1 tablet (500 mg total) by mouth 2 (two) times daily. Patient not taking: Reported on 05/19/2017 05/05/17   Vanna ScotlandBrandon, Ashley, MD  feeding supplement, ENSURE ENLIVE, (ENSURE ENLIVE) LIQD Take 237 mLs by mouth 2 (two) times daily between meals. Patient not taking: Reported on 01/24/2017 12/25/16   Enid BaasKalisetti, Radhika, MD  finasteride (PROSCAR) 5 MG tablet Take 1 tablet (5 mg total) by mouth daily. 04/27/16   Alford HighlandWieting, Richard, MD  furosemide (LASIX) 20 MG tablet Take 20 mg by mouth.    [provider]  loperamide (IMODIUM A-D) 2 MG tablet Take 2-4 mg by mouth as needed for diarrhea or loose stools. 4 mg at first episode of diarrhea, then 2 mg for each subsequent episode as needed    [provider]  omeprazole (PRILOSEC) 20 MG capsule Take 2 capsules (40 mg total) by mouth daily. Patient taking differently: Take 40 mg by mouth at bedtime.  04/26/16   Alford HighlandWieting, Richard, MD  PHENobarbital (LUMINAL) 64.8 MG tablet Take 2 tablets (129.6 mg total) by mouth every other day. 04/26/16   Alford HighlandWieting, Richard, MD  polyethylene glycol (MIRALAX / Ethelene HalGLYCOLAX) packet Take 17 g by mouth daily as needed for moderate constipation. Patient not taking: Reported on 05/27/2017 04/26/16   Alford HighlandWieting, Richard, MD  potassium chloride SA (K-DUR,KLOR-CON) 20 MEQ tablet Take 40 mEq by mouth 2 (two) times daily.     [provider]  pravastatin (PRAVACHOL) 40 MG tablet Take 1 tablet (40 mg total) by mouth daily. 04/26/16   Alford HighlandWieting, Richard, MD  tamsulosin (FLOMAX) 0.4 MG CAPS capsule Take 1 capsule (0.4 mg total) by mouth daily. 06/16/16   McGowan, Carollee HerterShannon A, PA-C  traMADol (ULTRAM) 50 MG tablet Take 1 tablet (50 mg total) by mouth every 6 (six) hours as needed for moderate  pain or severe pain. 09/24/16   Ramonita LabGouru, Aruna, MD    Allergies Penicillin g  Family History  Problem Relation Age of Onset  . Cancer Mother   . Prostate cancer Neg Hx   . Kidney disease Neg Hx   . Kidney cancer Neg Hx   . Bladder Cancer Neg Hx     Social History Social History   Tobacco Use  . Smoking status: Never Smoker  . Smokeless tobacco: Never Used  Substance Use Topics  . Alcohol use: No  . Drug use: No    Review of Systems  Constitutional: No fever/chills Eyes: No visual changes. ENT: No sore throat. Cardiovascular: Denies chest pain. Respiratory: Denies shortness of breath. Gastrointestinal: No abdominal pain.  No nausea, no vomiting.  No diarrhea.  No constipation. Genitourinary: Negative for dysuria. Musculoskeletal: Negative for back pain. Skin: Negative for rash. Neurological: Negative for headaches, focal weakness ____________________________________________   PHYSICAL EXAM:  VITAL SIGNS: ED Triage Vitals  Enc Vitals Group  BP 06/28/17 1005 124/69     Pulse Rate 06/28/17 1005 69     Resp 06/28/17 1005 16     Temp 06/28/17 1005 99.1 F (37.3 C)     Temp Source 06/28/17 1005 Oral     SpO2 06/28/17 1005 100 %     Weight 06/28/17 1004 253 lb (114.8 kg)     Height 06/28/17 1004 5\' 10"  (1.778 m)     Head Circumference --      Peak Flow --      Pain Score --      Pain Loc --      Pain Edu? --      Excl. in GC? --     Constitutional: Alert and oriented. Well appearing and in no acute distress. Eyes: Conjunctivae are normal.  Head: Atraumatic. Nose: No congestion/rhinnorhea. Mouth/Throat: Mucous membranes are moist.   Neck: No stridor.   cardiovascular: Regular pulse.  Good peripheral circulation. Respiratory: Normal respiratory effort.  No retractions.  Gastrointestinal: Soft and nontender. No distention. No abdominal bruits. site of suprapubic catheter looks clean and dry but the hole is already shot. Musculoskeletal: No lower extremity  tenderness Skin:  Skin is warm, dry and intact. No rash noted. Psychiatric: Mood and affect are normal. Speech and behavior are normal.  ____________________________________________   LABS (all labs ordered are listed, but only abnormal results are displayed)  ____________________________________________  EKG   ____________________________________________  RADIOLOGY   ____________________________________________   PROCEDURES  Procedure(s) performedafter discussion with radiology attempted to replace the suprapubic catheter. Patient was cleaned and prepped in usual sterile fashion lidocaine jelly was squeezed into the catheter site and then we attempted to replace the catheter the lidocaine jelly numbed up the area nicely but we could not get the catheter to go into the fistula as everything seems to spasm shot. We will call radiology and have them replace it under fluoroscopy. Procedures  Critical Care performed: ____________________________________________   INITIAL IMPRESSION / ASSESSMENT AND PLAN / ED COURSE   old records consult that they show the patient did indeed have the suprapubic catheter put in 3 days ago. He has had a indwelling Foley and has penile erosions from it. Interventional radiology was contacted better currently busy following another procedure.      ____________________________________________   FINAL CLINICAL IMPRESSION(S) / ED DIAGNOSES  Final diagnoses:  None     ED Discharge Orders    None       Note:  This document was prepared using Dragon voice recognition software and may include unintentional dictation errors.    Arnaldo NatalMalinda, Mariame Rybolt F, MD 06/28/17 [phone]

## 2017-06-28 NOTE — ED Notes (Signed)
Pt alert and oriented X4, active, cooperative, pt in NAD. RR even and unlabored, color WNL.  Pt informed to return if any life threatening symptoms occur.  Pt left with EMS to return to [facility].

## 2017-06-28 NOTE — ED Triage Notes (Signed)
Pt arrives via EMS [facility], states he woke up this AM and his superpubic catheter was lying in the bed with him, denies any complaints

## 2017-06-28 NOTE — ED Notes (Signed)
Pt taken to [facility] by this RN via stretcher.

## 2017-06-28 NOTE — ED Notes (Signed)
Called [facility], facility unable to transport. Report that pt is nonambulatory. Pt able to stand with assistance to get in wheelchair.

## 2017-06-28 NOTE — ED Notes (Addendum)
Pt had suprapubic catheter placed X 3 days ago. Awoke this AM and catheter out. Incision area has yellow drainage. No redness. Pt alert and oriented X4, active, cooperative, pt in NAD. RR even and unlabored, color WNL.

## 2017-06-29 ENCOUNTER — Encounter: Payer: Self-pay | Admitting: Diagnostic Radiology

## 2017-07-21 NOTE — Progress Notes (Signed)
07/25/2017 10:26 AM   [name] [birth year] [id number]  Referring provider: No referring provider defined for this encounter.  Chief Complaint  Patient presents with  . SPT Exchange    HPI: 76 yo WM with a right renal stone and urinary retention who is s/p SPT placement with serial dilation who presents today for his first in office SPT exchange.  Nephrolithiasis Patient with a finding of a right renal stone on CT in 12/2016 measuring 1.3 cm which was increased in size from prior CT in 2016.  Patient has a history of UTI with sepsis.  He underwent right ESWL on 05/05/2017 and the stone appeared to fragmented during the procedure.  No visible ureteral fragments seen on serial KUB's.    History of urinary retention He has had several episodes of urinary retention with large residuals, 500-900 cc.  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  He has completed a CT guided SPT placement with serial dilation on 06/28/2017.   He denies any gross hematuria or suprapubic pain. He has not had any recent fevers, chills, nausea or vomiting. He has not had any flank pain.  PMH significant for CHF, COPD, DVT, seizure disorder, schizophrenia and Bipolar 1 disorder.    PMH: Past Medical History:  Diagnosis Date  . Bipolar 1 disorder (HCC)   . C. difficile diarrhea 05/2016   history of ...  . CHF (congestive heart failure) (HCC)   . Chronic kidney disease 05/21/2016   acute renal failure with sepsis and uti  . COPD (chronic obstructive pulmonary disease) (HCC)   . DVT (deep venous thrombosis) (HCC)   . GERD (gastroesophageal reflux disease)   . Hematuria   . High cholesterol   . History of kidney stones   . Hypertension   . 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 Schizophrenia (HCC)   . Seizure (HCC)    on phenobarb  . Urinary retention    frequently requiring foley placement  . VRE (vancomycin  resistant enterococcus) culture positive    urine    Surgical History: Past Surgical History:  Procedure Laterality Date  . EXTRACORPOREAL SHOCK WAVE LITHOTRIPSY Right 05/05/2017   Procedure: EXTRACORPOREAL SHOCK WAVE LITHOTRIPSY (ESWL);  Surgeon: Vanna ScotlandBrandon, Ashley, MD;  Location: [facility] ORS;  Service: Urology;  Laterality: Right;  . IR CATHETER TUBE CHANGE  06/23/2017  . IR CATHETER TUBE CHANGE  06/28/2017    Home Medications:  Allergies as of 07/25/2017      Reactions   Penicillin G Rash, Other (See Comments)   Convulsions.    Has patient had a PCN reaction causing immediate rash, facial/tongue/throat swelling, SOB or lightheadedness with hypotension: not sure Has patient had a PCN reaction causing severe rash involving mucus membranes or skin necrosis: not sure Has patient had a PCN reaction that required hospitalization: not sure Has patient had a PCN reaction occurring within the last 10 years: not sure If all of the above answers are "NO", then may proceed with Cephalosporin use.      Medication List        Accurate as of 07/25/17 10:26 AM. Always use your most recent med list.          acetaminophen 325 MG tablet Commonly known as:  TYLENOL Take 2 tablets (650 mg total) by mouth every 6 (six) hours as needed.   amLODipine 2.5 MG tablet Commonly known as:  NORVASC  Take 1 tablet (2.5 mg total) by mouth daily.   ARIPiprazole 20 MG tablet Commonly known as:  ABILIFY Take 1 tablet by mouth daily.   buPROPion 300 MG 24 hr tablet Commonly known as:  BUDEPRION XL Take 1 tablet (300 mg total) by mouth daily.   carvedilol 25 MG tablet Commonly known as:  COREG Take 1 tablet (25 mg total) by mouth 2 (two) times daily.   cholecalciferol 1000 units tablet Commonly known as:  VITAMIN D Take 1,000 Units by mouth daily.   ELIQUIS 5 MG Tabs tablet Generic drug:  apixaban Take 1 tablet by mouth daily.   feeding supplement (ENSURE ENLIVE) Liqd Take 237 mLs by mouth 2 (two)  times daily between meals.   finasteride 5 MG tablet Commonly known as:  PROSCAR Take 1 tablet (5 mg total) by mouth daily.   furosemide 20 MG tablet Commonly known as:  LASIX Take 20 mg by mouth.   loperamide 2 MG tablet Commonly known as:  IMODIUM A-D Take 2-4 mg by mouth as needed for diarrhea or loose stools. 4 mg at first episode of diarrhea, then 2 mg for each subsequent episode as needed   omeprazole 20 MG capsule Commonly known as:  PRILOSEC Take 2 capsules (40 mg total) by mouth daily.   PHENobarbital 64.8 MG tablet Commonly known as:  LUMINAL Take 2 tablets (129.6 mg total) by mouth every other day.   polyethylene glycol packet Commonly known as:  MIRALAX / GLYCOLAX Take 17 g by mouth daily as needed for moderate constipation.   potassium chloride SA 20 MEQ tablet Commonly known as:  K-DUR,KLOR-CON Take 40 mEq by mouth 2 (two) times daily.   pravastatin 40 MG tablet Commonly known as:  PRAVACHOL Take 1 tablet (40 mg total) by mouth daily.   tamsulosin 0.4 MG Caps capsule Commonly known as:  FLOMAX Take 1 capsule (0.4 mg total) by mouth daily.   traMADol 50 MG tablet Commonly known as:  ULTRAM Take 1 tablet (50 mg total) by mouth every 6 (six) hours as needed for moderate pain or severe pain.   VENTOLIN HFA 108 (90 Base) MCG/ACT inhaler Generic drug:  albuterol Inhale 2 puffs into the lungs every 6 (six) hours as needed for shortness of breath.       Allergies:  Allergies  Allergen Reactions  . Penicillin G Rash and Other (See Comments)    Convulsions.    Has patient had a PCN reaction causing immediate rash, facial/tongue/throat swelling, SOB or lightheadedness with hypotension: not sure Has patient had a PCN reaction causing severe rash involving mucus membranes or skin necrosis: not sure Has patient had a PCN reaction that required hospitalization: not sure Has patient had a PCN reaction occurring within the last 10 years: not sure If all of the  above answers are "NO", then may proceed with Cephalosporin use.    Family History: Family History  Problem Relation Age of Onset  . Cancer Mother   . Prostate cancer Neg Hx   . Kidney disease Neg Hx   . Kidney cancer Neg Hx   . Bladder Cancer Neg Hx     Social History:  reports that  has never smoked. he has never used smokeless tobacco. He reports that he does not drink alcohol or use drugs.  ROS: UROLOGY Frequent Urination?: No Hard to postpone urination?: No Burning/pain with urination?: No Get up at night to urinate?: No Leakage of urine?: No Urine stream starts and stops?: No  Trouble starting stream?: No Do you have to strain to urinate?: No Blood in urine?: No Urinary tract infection?: No Sexually transmitted disease?: No Injury to kidneys or bladder?: No Painful intercourse?: No Weak stream?: No Erection problems?: No Penile pain?: No  Gastrointestinal Nausea?: No Vomiting?: No Indigestion/heartburn?: No Diarrhea?: No Constipation?: No  Constitutional Fever: No Night sweats?: No Weight loss?: No Fatigue?: No  Skin Skin rash/lesions?: No Itching?: No  Eyes Blurred vision?: No Double vision?: No  Ears/Nose/Throat Sore throat?: No Sinus problems?: No  Hematologic/Lymphatic Swollen glands?: No Easy bruising?: No  Cardiovascular Leg swelling?: No Chest pain?: No  Respiratory Cough?: No Shortness of breath?: No  Endocrine Excessive thirst?: No  Musculoskeletal Back pain?: No Joint pain?: No  Neurological Headaches?: No Dizziness?: No  Psychologic Depression?: No Anxiety?: No  Physical Exam: BP 123/76   Pulse 64   Constitutional: Well nourished. Alert and oriented, No acute distress. HEENT: [place] AT, moist mucus membranes. Trachea midline, no masses. Cardiovascular: No clubbing, cyanosis, or edema. Respiratory: Normal respiratory effort, no increased work of breathing. GI: Abdomen is soft, non tender, non distended, no  abdominal masses. Liver and spleen not palpable.  No hernias appreciated.  Stool sample for occult testing is not indicated.   SPT site is clean and dry.   GU: No CVA tenderness.  No bladder fullness or masses.   Skin: No rashes, bruises or suspicious lesions. Lymph: No cervical or inguinal adenopathy. Neurologic: Grossly intact, no focal deficits, moving all 4 extremities. Psychiatric: Normal mood and affect.   Laboratory Data: Lab Results  Component Value Date   WBC 6.4 05/27/2017   HGB 11.5 (L) 05/27/2017   HCT 34.6 (L) 05/27/2017   MCV 85.5 05/27/2017   PLT 215 05/27/2017    Lab Results  Component Value Date   CREATININE 1.19 12/25/2016    Lab Results  Component Value Date   AST 13 (L) 12/23/2016   Lab Results  Component Value Date   ALT 7 (L) 12/23/2016   I have reviewed the labs  Procedure Suprapubic Cath Change  Patient is present today for a suprapubic catheter change due to urinary retention.  5 ml of water was drained from the balloon, a 16 FR retention catheter was removed from the tract with out difficulty.  Site was cleaned and prepped in a sterile fashion with betadine.  A 16 FR foley cath was replaced into the tract no complications were noted. Urine return was noted, 10 ml of sterile water was inflated into the balloon and a overnight bag was attached for drainage.  Patient tolerated well. A night bag was given to patient and proper instruction was given on how to switch bags.    Preformed by: Michiel CowboyShannon Leoma Folds, PA-C  Follow up: Otis Dialsassandra Lowe, CMA  Assessment & Plan:    1. Right renal stone  - s/p right ESWL on 05/05/2017   - Fragments never returned for analysis  - patient is not complaining of renal colic or gross hematuria at this time  2. Urethral erosion  - patient completed SPT placement   - SPT was changed in the office today - he will have home health continue SPT exchanges at home - he will follow up with us in 3 months  3. Urinary  retention:     - Will need cystoscopy to complete hematuria work up - will discuss further at three month visit   4. BPH with LUTS   - Continue finasteride 5 mg daily  -  discontinue tamsulosin as patient has a SPT    5. History of hematuria  - Resolved with antibiotics and discontinuation of Eliquis    Return in about 3 months (around 10/23/2017) for SPT exchange.  These notes generated with voice recognition software. I apologize for typographical errors.  Michiel Cowboy, PA-C  [facility] [address], [address] [place], [place] [address] [phone]

## 2017-07-22 ENCOUNTER — Other Ambulatory Visit: Payer: Self-pay | Admitting: Urology

## 2017-07-25 ENCOUNTER — Encounter: Payer: Self-pay | Admitting: Urology

## 2017-07-25 ENCOUNTER — Ambulatory Visit (INDEPENDENT_AMBULATORY_CARE_PROVIDER_SITE_OTHER): Payer: Medicare Other | Admitting: Urology

## 2017-07-25 VITALS — BP 123/76 | HR 64

## 2017-07-25 DIAGNOSIS — Z87448 Personal history of other diseases of urinary system: Secondary | ICD-10-CM | POA: Diagnosis not present

## 2017-07-25 DIAGNOSIS — N200 Calculus of kidney: Secondary | ICD-10-CM | POA: Diagnosis not present

## 2017-07-25 DIAGNOSIS — R339 Retention of urine, unspecified: Secondary | ICD-10-CM | POA: Diagnosis not present

## 2017-07-28 ENCOUNTER — Telehealth: Payer: Self-pay | Admitting: Urology

## 2017-07-28 NOTE — Telephone Encounter (Signed)
Dorene SorrowJerry a home health nurse is calling asking for orders to have his SPT tube changed every month. Wants a call back @ [phone]  [facility]

## 2017-08-04 NOTE — Telephone Encounter (Signed)
Left mess to call 

## 2017-10-23 NOTE — Progress Notes (Signed)
10/24/2017 10:08 AM   [name] [birth year] [id number]  Referring provider: No referring provider defined for this encounter.  No chief complaint on file.   HPI: 77 yo WM with a right renal stone and urinary retention who is s/p SPT placement with serial dilation who presents today for SPT exchange with his caregiver.    Nephrolithiasis Patient with a finding of a right renal stone on CT in 12/2016 measuring 1.3 cm which was increased in size from prior CT in 2016.  Patient has a history of UTI with sepsis.  He underwent right ESWL on 05/05/2017 and the stone appeared to fragmented during the procedure.  No visible ureteral fragments seen on serial KUB's.    History of urinary retention He has had several episodes of urinary retention with large residuals, 500-900 cc.  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  He has completed a CT guided SPT placement with serial dilation on 06/28/2017.   He denies any gross hematuria or suprapubic pain. He has not had any recent fevers, chills, nausea or vomiting. He has not had any flank pain.  PMH significant for CHF, COPD, DVT, seizure disorder, schizophrenia and Bipolar 1 disorder.    PMH: Past Medical History:  Diagnosis Date  . Bipolar 1 disorder (HCC)   . C. difficile diarrhea 05/2016   history of ...  . CHF (congestive heart failure) (HCC)   . Chronic kidney disease 05/21/2016   acute renal failure with sepsis and uti  . COPD (chronic obstructive pulmonary disease) (HCC)   . DVT (deep venous thrombosis) (HCC)   . GERD (gastroesophageal reflux disease)   . Hematuria   . High cholesterol   . History of kidney stones   . Hypertension   . 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 Schizophrenia (HCC)   . Seizure (HCC)    on phenobarb  . Urinary retention    frequently requiring foley placement  . VRE (vancomycin resistant enterococcus) culture  positive    urine    Surgical History: Past Surgical History:  Procedure Laterality Date  . EXTRACORPOREAL SHOCK WAVE LITHOTRIPSY Right 05/05/2017   Procedure: EXTRACORPOREAL SHOCK WAVE LITHOTRIPSY (ESWL);  Surgeon: Vanna Scotland, MD;  Location: [facility] ORS;  Service: Urology;  Laterality: Right;  . IR CATHETER TUBE CHANGE  06/23/2017  . IR CATHETER TUBE CHANGE  06/28/2017    Home Medications:  Allergies as of 10/24/2017      Reactions   Penicillin G Rash, Other (See Comments)   Convulsions.    Has patient had a PCN reaction causing immediate rash, facial/tongue/throat swelling, SOB or lightheadedness with hypotension: not sure Has patient had a PCN reaction causing severe rash involving mucus membranes or skin necrosis: not sure Has patient had a PCN reaction that required hospitalization: not sure Has patient had a PCN reaction occurring within the last 10 years: not sure If all of the above answers are "NO", then may proceed with Cephalosporin use.      Medication List        Accurate as of 10/24/17 10:08 AM. Always use your most recent med list.          acetaminophen 325 MG tablet Commonly known as:  TYLENOL Take 2 tablets (650 mg total) by mouth every 6 (six) hours as needed.   amLODipine 2.5 MG tablet Commonly known as:  NORVASC Take 1 tablet (2.5 mg  total) by mouth daily.   ARIPiprazole 20 MG tablet Commonly known as:  ABILIFY Take 1 tablet by mouth daily.   buPROPion 300 MG 24 hr tablet Commonly known as:  BUDEPRION XL Take 1 tablet (300 mg total) by mouth daily.   carvedilol 25 MG tablet Commonly known as:  COREG Take 1 tablet (25 mg total) by mouth 2 (two) times daily.   cholecalciferol 1000 units tablet Commonly known as:  VITAMIN D Take 1,000 Units by mouth daily.   ELIQUIS 5 MG Tabs tablet Generic drug:  apixaban Take 1 tablet by mouth daily.   feeding supplement (ENSURE ENLIVE) Liqd Take 237 mLs by mouth 2 (two) times daily between meals.     finasteride 5 MG tablet Commonly known as:  PROSCAR Take 1 tablet (5 mg total) by mouth daily.   furosemide 20 MG tablet Commonly known as:  LASIX Take 20 mg by mouth.   loperamide 2 MG tablet Commonly known as:  IMODIUM A-D Take 2-4 mg by mouth as needed for diarrhea or loose stools. 4 mg at first episode of diarrhea, then 2 mg for each subsequent episode as needed   omeprazole 20 MG capsule Commonly known as:  PRILOSEC Take 2 capsules (40 mg total) by mouth daily.   PHENobarbital 64.8 MG tablet Commonly known as:  LUMINAL Take 2 tablets (129.6 mg total) by mouth every other day.   polyethylene glycol packet Commonly known as:  MIRALAX / GLYCOLAX Take 17 g by mouth daily as needed for moderate constipation.   potassium chloride SA 20 MEQ tablet Commonly known as:  K-DUR,KLOR-CON Take 40 mEq by mouth 2 (two) times daily.   pravastatin 40 MG tablet Commonly known as:  PRAVACHOL Take 1 tablet (40 mg total) by mouth daily.   tamsulosin 0.4 MG Caps capsule Commonly known as:  FLOMAX Take 1 capsule (0.4 mg total) by mouth daily.   traMADol 50 MG tablet Commonly known as:  ULTRAM Take 1 tablet (50 mg total) by mouth every 6 (six) hours as needed for moderate pain or severe pain.   VENTOLIN HFA 108 (90 Base) MCG/ACT inhaler Generic drug:  albuterol Inhale 2 puffs into the lungs every 6 (six) hours as needed for shortness of breath.       Allergies:  Allergies  Allergen Reactions  . Penicillin G Rash and Other (See Comments)    Convulsions.    Has patient had a PCN reaction causing immediate rash, facial/tongue/throat swelling, SOB or lightheadedness with hypotension: not sure Has patient had a PCN reaction causing severe rash involving mucus membranes or skin necrosis: not sure Has patient had a PCN reaction that required hospitalization: not sure Has patient had a PCN reaction occurring within the last 10 years: not sure If all of the above answers are "NO", then  may proceed with Cephalosporin use.    Family History: Family History  Problem Relation Age of Onset  . Cancer Mother   . Prostate cancer Neg Hx   . Kidney disease Neg Hx   . Kidney cancer Neg Hx   . Bladder Cancer Neg Hx     Social History:  reports that  has never smoked. he has never used smokeless tobacco. He reports that he does not drink alcohol or use drugs.  ROS: UROLOGY Frequent Urination?: No Hard to postpone urination?: No Burning/pain with urination?: No Get up at night to urinate?: No Leakage of urine?: No Urine stream starts and stops?: No Trouble starting stream?: No  Do you have to strain to urinate?: No Blood in urine?: No Urinary tract infection?: No Sexually transmitted disease?: No Injury to kidneys or bladder?: No Painful intercourse?: No Weak stream?: No Erection problems?: No Penile pain?: No  Gastrointestinal Nausea?: No Vomiting?: No Indigestion/heartburn?: No Diarrhea?: No Constipation?: No  Constitutional Fever: No Night sweats?: No Weight loss?: No Fatigue?: No  Skin Skin rash/lesions?: No Itching?: No  Eyes Blurred vision?: No Double vision?: No  Ears/Nose/Throat Sore throat?: No Sinus problems?: No  Hematologic/Lymphatic Swollen glands?: No Easy bruising?: No  Cardiovascular Leg swelling?: No Chest pain?: No  Respiratory Cough?: No Shortness of breath?: No  Endocrine Excessive thirst?: No  Musculoskeletal Back pain?: No Joint pain?: No  Neurological Headaches?: No Dizziness?: No  Psychologic Depression?: No Anxiety?: No  Physical Exam: BP 103/71   Pulse 67   Resp 16   Ht 5\' 10"  (1.778 m)   Wt 247 lb (112 kg)   BMI 35.44 kg/m   Constitutional: Well nourished. Alert and oriented, No acute distress. HEENT: [place] AT, moist mucus membranes. Trachea midline, no masses. Cardiovascular: No clubbing, cyanosis, or edema. Respiratory: Normal respiratory effort, no increased work of breathing. GI:  Abdomen is soft, non tender, non distended, no abdominal masses. Liver and spleen not palpable.  No hernias appreciated.  Stool sample for occult testing is not indicated.   SPT site is clean and dry.  There appears to be an area of irritation in a linear fashion at the site.  The SPT tube is found to be on tension.   GU: No CVA tenderness.  No bladder fullness or masses.   Skin: No rashes, bruises or suspicious lesions. Lymph: No cervical or inguinal adenopathy. Neurologic: Grossly intact, no focal deficits, moving all 4 extremities. Psychiatric: Normal mood and affect.   Laboratory Data: Lab Results  Component Value Date   WBC 6.4 05/27/2017   HGB 11.5 (L) 05/27/2017   HCT 34.6 (L) 05/27/2017   MCV 85.5 05/27/2017   PLT 215 05/27/2017    Lab Results  Component Value Date   CREATININE 1.19 12/25/2016    Lab Results  Component Value Date   AST 13 (L) 12/23/2016   Lab Results  Component Value Date   ALT 7 (L) 12/23/2016   I have reviewed the labs  Procedure Suprapubic Cath Change Patient is present today for a suprapubic catheter change due to urinary retention.  10 ml of water was drained from the balloon, a 16 FR foley cath was removed from the tract with out difficulty.  Site was cleaned and prepped in a sterile fashion with betadine.  A 16 FR foley cath was replaced into the tract no complications were noted. Urine return was noted, 10 ml of sterile water was inflated into the balloon and a leg bag was attached for drainage.  Patient tolerated well. A night bag was given to patient and proper instruction was given on how to switch bags.    Preformed by: Michiel Cowboy, PA-C  Assessment & Plan:    1. History of right renal stone  - s/p right ESWL on 05/05/2017   - Fragments never returned for analysis  - patient is not complaining of renal colic or gross hematuria at this time  2. Urethral erosion  - patient completed SPT placement   - SPT was changed in the office  today - he will have home health continue SPT exchanges at home - advised to keep the SPT off tension - he will  follow up with us in 3 months  3. Urinary retention:     - Will need cystoscopy to complete hematuria work up - patient declined cystoscopy  4. BPH with LUTS   - Continue finasteride 5 mg daily  - discontinue tamsulosin as patient has a SPT    5. History of hematuria  - Resolved with antibiotics and discontinuation of Eliquis  - will report any gross hematuria    Return in about 3 months (around 01/24/2018) for SPT exchange .  These notes generated with voice recognition software. I apologize for typographical errors.  Michiel CowboySHANNON Eryn Marandola, PA-C  [facility] [address], [address] [place], [place] [address] [phone]

## 2017-10-24 ENCOUNTER — Ambulatory Visit (INDEPENDENT_AMBULATORY_CARE_PROVIDER_SITE_OTHER): Payer: Medicare Other | Admitting: Urology

## 2017-10-24 ENCOUNTER — Encounter: Payer: Self-pay | Admitting: Urology

## 2017-10-24 VITALS — BP 103/71 | HR 67 | Resp 16 | Ht 70.0 in | Wt 247.0 lb

## 2017-10-24 DIAGNOSIS — Z87898 Personal history of other specified conditions: Secondary | ICD-10-CM | POA: Diagnosis not present

## 2017-10-24 DIAGNOSIS — N368 Other specified disorders of urethra: Secondary | ICD-10-CM

## 2018-01-22 NOTE — Progress Notes (Signed)
01/23/2018 10:40 AM   [name] DarbyJerry L Holck 1941-01-01 [id number]  Referring provider: No referring provider defined for this encounter.  Chief Complaint  Patient presents with  . Urinary Retention    SPT exchange    HPI: 77 yo WM with a history of nephrolithiasis, a history of  urinary retention who is s/p SPT placement, a history of urethral erosion and a history of hematuria who presents today for SPT exchange with his caregiver.    History of nephrolithiasis Patient with a finding of a right renal stone on CT in 12/2016 measuring 1.3 cm which was increased in size from prior CT in 2016.  Patient has a history of UTI with sepsis.  He underwent right ESWL on 05/05/2017 and the stone appeared to fragmented during the procedure.  Right stone seen on 05/2017 KUB.  He has not had any flank pain.   History of urinary retention He has had several episodes of urinary retention with large residuals, 500-900 cc.  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  He has completed a CT guided SPT placement with serial dilation on 06/28/2017.   He is not experiencing leaking from the urethra.  He is experiencing pain with movement with the SPT.  SPT was found on tension today.  Patient's caregiver would like to have cranberry tablets to clear the urine.    History of hematuria Contrast CT in 12/2016 noted an unremarkable adrenal glands. Nonobstructing right renal calculi measuring up to 1.3 cm in size, overall slightly increased from the prior CT. 5.1 cm right upper pole renal cyst has mildly enlarged as have smaller cysts in the right lower pole measuring up to 2 cm in size. Small cysts in the left kidney measure up to 2.2 cm. No hydronephrosis. Poor intravascular enhancement on the portal venous phase images with no significant contrast excretion on delayed images, possibly reflecting an issue with the contrast injection. Foley catheter and  small amount of gas in the bladder.  Patient refused cystoscopy.  He does not report any gross hematuria.    PMH significant for CHF, COPD, DVT, seizure disorder, schizophrenia and Bipolar 1 disorder.    PMH: Past Medical History:  Diagnosis Date  . Bipolar 1 disorder (HCC)   . C. difficile diarrhea 05/2016   history of ...  . CHF (congestive heart failure) (HCC)   . Chronic kidney disease 05/21/2016   acute renal failure with sepsis and uti  . COPD (chronic obstructive pulmonary disease) (HCC)   . DVT (deep venous thrombosis) (HCC)   . GERD (gastroesophageal reflux disease)   . Hematuria   . High cholesterol   . History of kidney stones   . Hypertension   . 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 Schizophrenia (HCC)   . Seizure (HCC)    on phenobarb  . Urinary retention    frequently requiring foley placement  . VRE (vancomycin resistant enterococcus) culture positive    urine    Surgical History: Past Surgical History:  Procedure Laterality Date  . EXTRACORPOREAL SHOCK WAVE LITHOTRIPSY Right 05/05/2017   Procedure: EXTRACORPOREAL SHOCK WAVE LITHOTRIPSY (ESWL);  Surgeon: Vanna ScotlandBrandon, Ashley, MD;  Location: [facility] ORS;  Service: Urology;  Laterality: Right;  . IR CATHETER TUBE CHANGE  06/23/2017  . IR CATHETER TUBE CHANGE  06/28/2017    Home Medications:  Allergies as of 01/23/2018      Reactions  Penicillin G Rash, Other (See Comments)   Convulsions.    Has patient had a PCN reaction causing immediate rash, facial/tongue/throat swelling, SOB or lightheadedness with hypotension: not sure Has patient had a PCN reaction causing severe rash involving mucus membranes or skin necrosis: not sure Has patient had a PCN reaction that required hospitalization: not sure Has patient had a PCN reaction occurring within the last 10 years: not sure If all of the above answers are "NO", then may proceed with Cephalosporin use.      Medication List        Accurate as of  01/23/18 10:40 AM. Always use your most recent med list.          acetaminophen 325 MG tablet Commonly known as:  TYLENOL Take 2 tablets (650 mg total) by mouth every 6 (six) hours as needed.   amLODipine 2.5 MG tablet Commonly known as:  NORVASC Take 1 tablet (2.5 mg total) by mouth daily.   ARIPiprazole 20 MG tablet Commonly known as:  ABILIFY Take 1 tablet by mouth daily.   buPROPion 300 MG 24 hr tablet Commonly known as:  BUDEPRION XL Take 1 tablet (300 mg total) by mouth daily.   carvedilol 25 MG tablet Commonly known as:  COREG Take 1 tablet (25 mg total) by mouth 2 (two) times daily.   cholecalciferol 1000 units tablet Commonly known as:  VITAMIN D Take 1,000 Units by mouth daily.   ELIQUIS 5 MG Tabs tablet Generic drug:  apixaban Take 1 tablet by mouth daily.   feeding supplement (ENSURE ENLIVE) Liqd Take 237 mLs by mouth 2 (two) times daily between meals.   finasteride 5 MG tablet Commonly known as:  PROSCAR Take 1 tablet (5 mg total) by mouth daily.   furosemide 20 MG tablet Commonly known as:  LASIX Take 20 mg by mouth.   loperamide 2 MG tablet Commonly known as:  IMODIUM A-D Take 2-4 mg by mouth as needed for diarrhea or loose stools. 4 mg at first episode of diarrhea, then 2 mg for each subsequent episode as needed   omeprazole 20 MG capsule Commonly known as:  PRILOSEC Take 2 capsules (40 mg total) by mouth daily.   PHENobarbital 64.8 MG tablet Commonly known as:  LUMINAL Take 2 tablets (129.6 mg total) by mouth every other day.   polyethylene glycol packet Commonly known as:  MIRALAX / GLYCOLAX Take 17 g by mouth daily as needed for moderate constipation.   potassium chloride SA 20 MEQ tablet Commonly known as:  K-DUR,KLOR-CON Take 40 mEq by mouth 2 (two) times daily.   pravastatin 40 MG tablet Commonly known as:  PRAVACHOL Take 1 tablet (40 mg total) by mouth daily.   tamsulosin 0.4 MG Caps capsule Commonly known as:  FLOMAX Take 1  capsule (0.4 mg total) by mouth daily.   traMADol 50 MG tablet Commonly known as:  ULTRAM Take 1 tablet (50 mg total) by mouth every 6 (six) hours as needed for moderate pain or severe pain.   VENTOLIN HFA 108 (90 Base) MCG/ACT inhaler Generic drug:  albuterol Inhale 2 puffs into the lungs every 6 (six) hours as needed for shortness of breath.       Allergies:  Allergies  Allergen Reactions  . Penicillin G Rash and Other (See Comments)    Convulsions.    Has patient had a PCN reaction causing immediate rash, facial/tongue/throat swelling, SOB or lightheadedness with hypotension: not sure Has patient had a PCN reaction causing  severe rash involving mucus membranes or skin necrosis: not sure Has patient had a PCN reaction that required hospitalization: not sure Has patient had a PCN reaction occurring within the last 10 years: not sure If all of the above answers are "NO", then may proceed with Cephalosporin use.    Family History: Family History  Problem Relation Age of Onset  . Cancer Mother   . Prostate cancer Neg Hx   . Kidney disease Neg Hx   . Kidney cancer Neg Hx   . Bladder Cancer Neg Hx     Social History:  reports that he has never smoked. He has never used smokeless tobacco. He reports that he does not drink alcohol or use drugs.  ROS: UROLOGY Frequent Urination?: No Hard to postpone urination?: No Burning/pain with urination?: No Get up at night to urinate?: No Leakage of urine?: No Urine stream starts and stops?: No Trouble starting stream?: No Do you have to strain to urinate?: No Blood in urine?: No Urinary tract infection?: No Sexually transmitted disease?: No Injury to kidneys or bladder?: No Painful intercourse?: No Weak stream?: No Erection problems?: No Penile pain?: No  Gastrointestinal Nausea?: No Vomiting?: No Indigestion/heartburn?: No Diarrhea?: No Constipation?: No  Constitutional Fever: No Night sweats?: No Weight loss?:  No Fatigue?: No  Skin Skin rash/lesions?: No Itching?: No  Eyes Blurred vision?: No Double vision?: No  Ears/Nose/Throat Sore throat?: No Sinus problems?: No  Hematologic/Lymphatic Swollen glands?: No Easy bruising?: No  Cardiovascular Leg swelling?: No Chest pain?: No  Respiratory Cough?: No Shortness of breath?: No  Endocrine Excessive thirst?: No  Musculoskeletal Back pain?: No Joint pain?: No  Neurological Headaches?: No Dizziness?: No  Psychologic Depression?: No Anxiety?: No  Physical Exam: BP 114/77 (BP Location: Right Arm, Patient Position: Sitting, Cuff Size: Large)   Ht 5\' 10"  (1.778 m)   BMI 35.44 kg/m   Constitutional: Well nourished. Alert and oriented, No acute distress. HEENT: [place] AT, moist mucus membranes. Trachea midline, no masses. Cardiovascular: No clubbing, cyanosis, or edema. Respiratory: Normal respiratory effort, no increased work of breathing. GI: Abdomen is soft, non tender, non distended, no abdominal masses. Liver and spleen not palpable.  No hernias appreciated.  Stool sample for occult testing is not indicated.   SPT site is clean and dry.  The tube is found on tension again with a linear area of irritation.   GU: No CVA tenderness.  No bladder fullness or masses.  Patient with circumcised phallus.  Urethral erosion is noted to mid shaft.   Skin: No rashes, bruises or suspicious lesions. Lymph: No cervical or inguinal adenopathy. Neurologic: Grossly intact, no focal deficits, moving all 4 extremities. Psychiatric: Normal mood and affect.  Laboratory Data: Lab Results  Component Value Date   WBC 6.4 05/27/2017   HGB 11.5 (L) 05/27/2017   HCT 34.6 (L) 05/27/2017   MCV 85.5 05/27/2017   PLT 215 05/27/2017    Lab Results  Component Value Date   CREATININE 1.19 12/25/2016    Lab Results  Component Value Date   AST 13 (L) 12/23/2016   Lab Results  Component Value Date   ALT 7 (L) 12/23/2016   I have reviewed the  labs  Pertinent Imaging CLINICAL DATA:  Kidney stone.  EXAM: ABDOMEN - 1 VIEW  COMPARISON:  05/05/2017.  FINDINGS: Soft tissue structures are unremarkable. Stool noted within the colon. Constipation cannot be excluded. The colon is distended consistent with colonic ileus. Distal colonic obstruction cannot be excluded. Follow-up  exam suggested to demonstrate resolution. Right nephrolithiasis.Stable tiny sclerotic focus right ilium consistent with bone island. Degenerative changes lumbar spine and both hips. Catheter noted pelvis.  IMPRESSION: 1. Colonic distention consistent with colonic ileus. Colonic obstruction cannot be excluded. Stool noted throughout the colon. Constipation cannot be excluded.  2. Right nephrolithiasis again noted.   Electronically Signed   By: Maisie Fus  Register   On: 05/19/2017 10:02  I have independently reviewed the films   Assessment & Plan:    1. History of right renal stone s/p right ESWL on 05/05/2017  Fragments never returned for analysis Right renal stone seen on 05/2017 KUB RTC in 05/2018 for KUB  2. Urethral erosion patient completed SPT placement in 06/2017 - will need cystoscopy in 06/2018 SPT was changed in the office today - he will have home health continue SPT exchanges at home - advised to keep the SPT off tension - he will follow up with [place] in 3 months  3. Urinary retention:    SPT in place Prescription written for cranberry tablets to hopefully clear the sediment and odor of the SPT   4. BPH with LUTS  Continue finasteride 5 mg daily   5. History of hematuria Contrast CT in 12/2016 noted no renal malignancies - refused cystoscopy Hematuria resolved with antibiotics and discontinuation of Eliquis No reports of gross hematuria     Return in about 3 months (around 04/25/2018) for cystoscopy for SPT .  These notes generated with voice recognition software. I apologize for typographical errors.  Michiel Cowboy,  PA-C  [facility] [address] [place], [place] [address] [phone]

## 2018-01-23 ENCOUNTER — Encounter: Payer: Self-pay | Admitting: Urology

## 2018-01-23 ENCOUNTER — Ambulatory Visit (INDEPENDENT_AMBULATORY_CARE_PROVIDER_SITE_OTHER): Payer: Medicare Other | Admitting: Urology

## 2018-01-23 ENCOUNTER — Other Ambulatory Visit: Payer: Self-pay

## 2018-01-23 VITALS — BP 114/77 | Ht 70.0 in

## 2018-01-23 DIAGNOSIS — N368 Other specified disorders of urethra: Secondary | ICD-10-CM

## 2018-01-23 DIAGNOSIS — Z87448 Personal history of other diseases of urinary system: Secondary | ICD-10-CM

## 2018-01-23 DIAGNOSIS — N138 Other obstructive and reflux uropathy: Secondary | ICD-10-CM

## 2018-01-23 DIAGNOSIS — Z87898 Personal history of other specified conditions: Secondary | ICD-10-CM

## 2018-01-23 DIAGNOSIS — N200 Calculus of kidney: Secondary | ICD-10-CM

## 2018-01-23 DIAGNOSIS — N401 Enlarged prostate with lower urinary tract symptoms: Secondary | ICD-10-CM

## 2018-01-23 MED ORDER — CRANBERRY 300 MG PO TABS: 300.0000 mg | ORAL_TABLET | Freq: Two times a day (BID) | ORAL | 3 refills | Status: DC

## 2018-04-26 ENCOUNTER — Encounter: Payer: Self-pay | Admitting: Urology

## 2018-04-26 ENCOUNTER — Ambulatory Visit
Admission: RE | Admit: 2018-04-26 | Discharge: 2018-04-26 | Disposition: A | Discharge status: Home / Self Care | Payer: Medicare Other | Source: Ambulatory Visit | Attending: Urology | Admitting: Urology

## 2018-04-26 ENCOUNTER — Ambulatory Visit (INDEPENDENT_AMBULATORY_CARE_PROVIDER_SITE_OTHER): Payer: Medicare Other | Admitting: Urology

## 2018-04-26 VITALS — BP 106/68 | HR 64

## 2018-04-26 DIAGNOSIS — N200 Calculus of kidney: Secondary | ICD-10-CM

## 2018-04-26 DIAGNOSIS — Z87448 Personal history of other diseases of urinary system: Secondary | ICD-10-CM | POA: Diagnosis not present

## 2018-04-26 DIAGNOSIS — Z87898 Personal history of other specified conditions: Secondary | ICD-10-CM

## 2018-04-26 NOTE — Progress Notes (Signed)
   04/26/18  CC: No chief complaint on file.   HPI: 77 year old male with chronic urinary retention and an indwelling suprapubic tube presents for surveillance cystoscopy.  Cystoscopy Procedure Note  Patient identification was confirmed, informed consent was obtained, and patient was prepped using Betadine solution.  Lidocaine jelly was administered per urethral meatus.    Preoperative abx where received prior to procedure.     Pre-Procedure: - Inspection reveals a normal caliber ureteral meatus.  Procedure: The suprapubic tube was removed and the flexible cystoscope was introduced through the suprapubic tract without difficulty - Bilateral ureteral orifices identified - Bladder mucosa  reveals no ulcers, tumors, or lesions - Inflammatory changes noted secondary to the indwelling tube but no suspicious findings - No bladder stones -Moderate trabeculation  Post-Procedure: - Patient tolerated the procedure well  Assessment/ Plan: Continue quarterly follow-up with Carollee HerterShannon.  Cystoscopy 1 year.

## 2018-05-04 IMAGING — CT CT HEAD W/O CM
4 series · 15 of 47 positions shown, 17 images · non-contrast
Comparison: February 21, 2015

CLINICAL DATA: Pain after fall.

EXAM:
CT HEAD WITHOUT CONTRAST
TECHNIQUE: Contiguous axial images were obtained from the base of the skull
through the vertex without intravenous contrast.

[Series 2: head wo · axial · 0.41mm/px · z∈[+282,+392]mm · 7 of 30 slices shown, 9 images]
[im 4/30  brain]
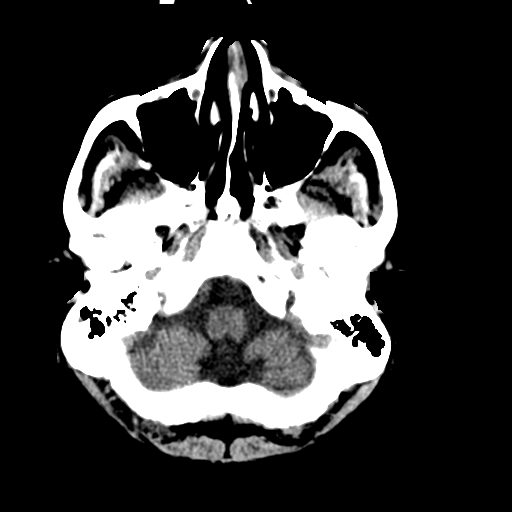
[im 4/30  bone]
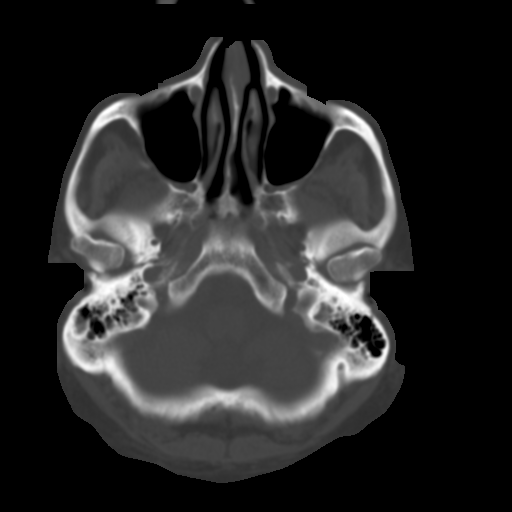
[im 8/30  brain]
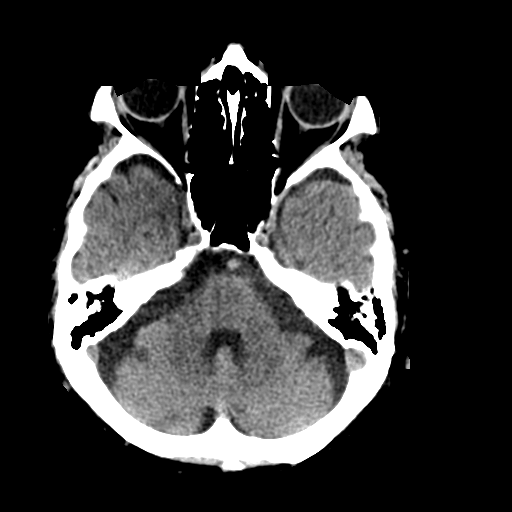
[im 11/30  brain]
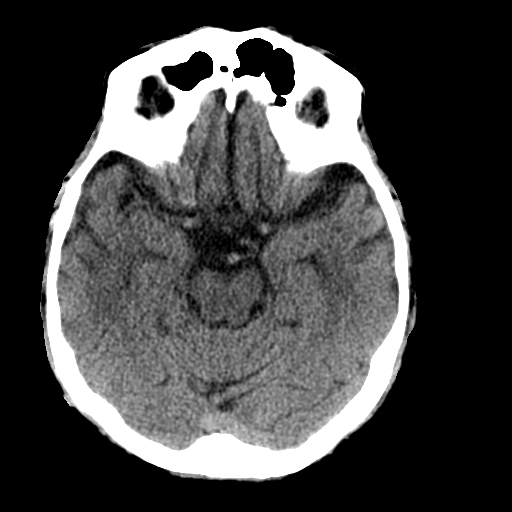
[im 15/30  brain]
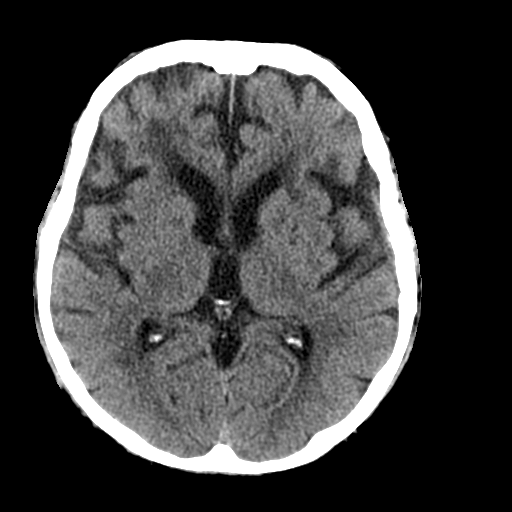
[im 19/30  brain]
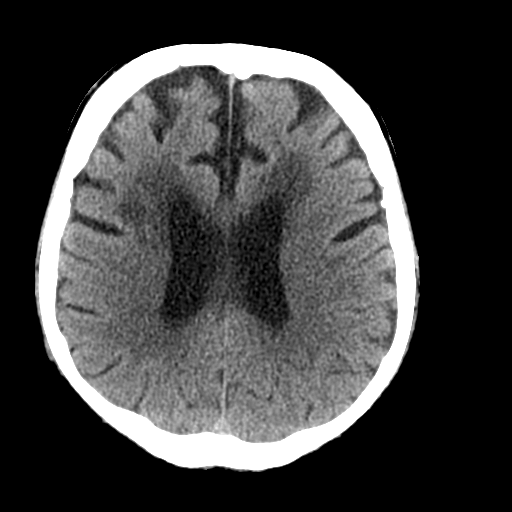
[im 19/30  bone]
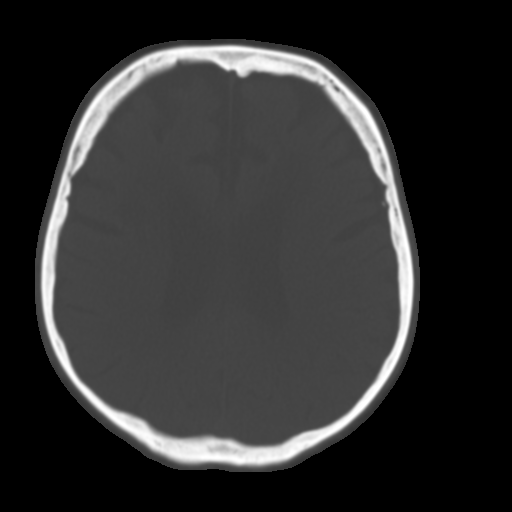
[im 22/30  brain]
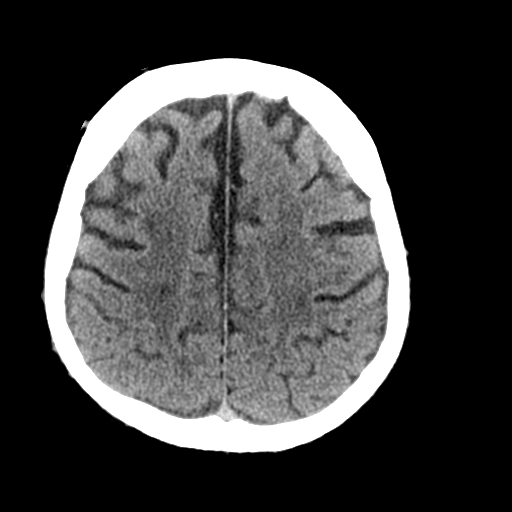
[im 26/30  brain]
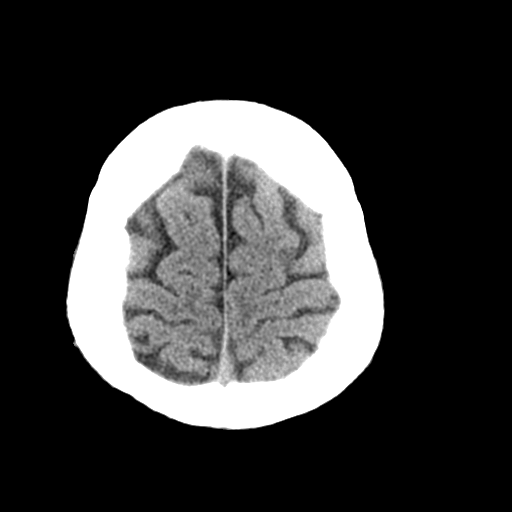

[Series 3: head bone · axial · 0.41mm/px · z∈[+281,+295]mm · 2 of 75 slices shown]
[im 8/75  bone]
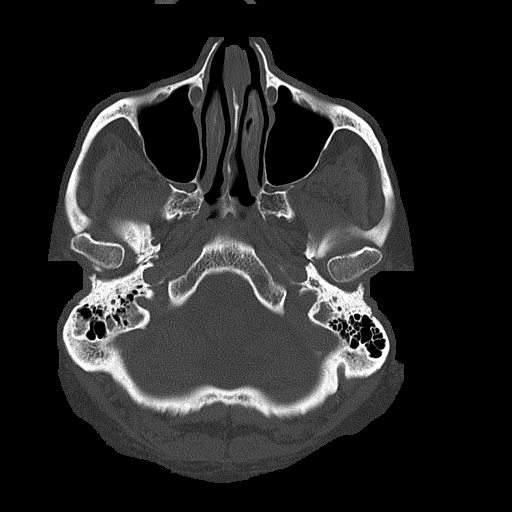
[im 15/75  bone]
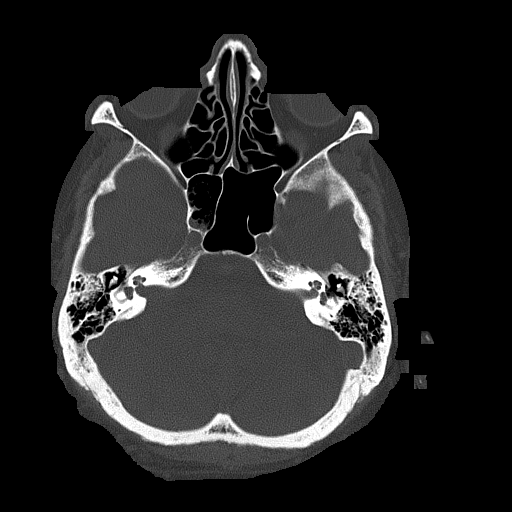

[Series 4: coronal soft tissue · coronal · 0.31mm/px · 3 of 61 slices shown]
[im 21/61  brain]
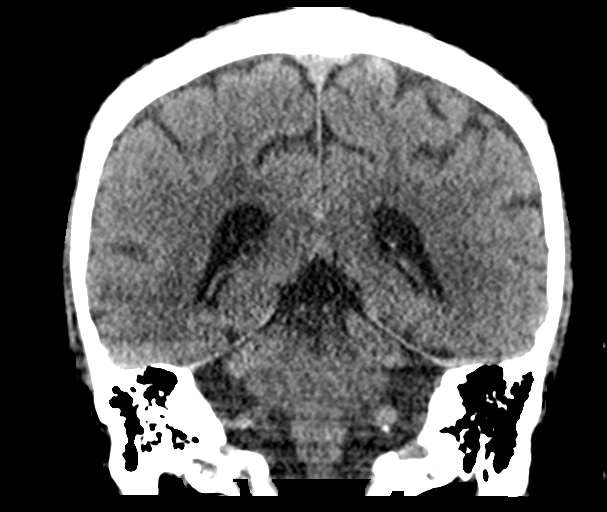
[im 27/61  brain]
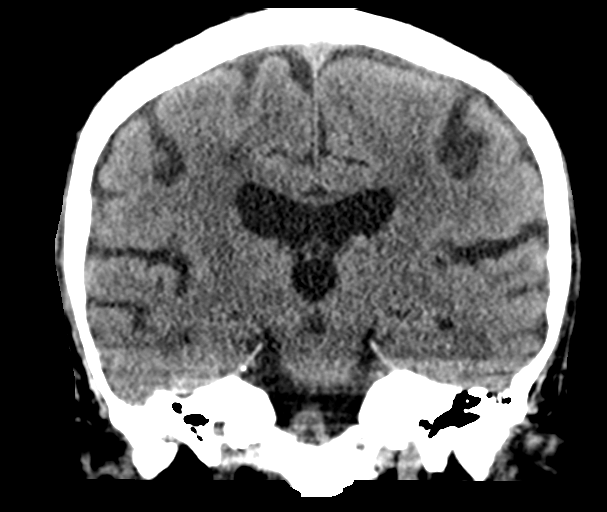
[im 34/61  brain]
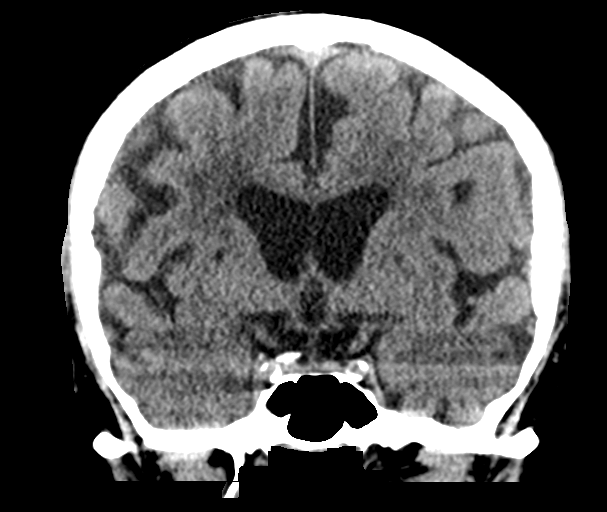

[Series 5: sagittal soft tissue · sagittal · 0.29mm/px · 3 of 57 slices shown]
[im 19/57  brain]
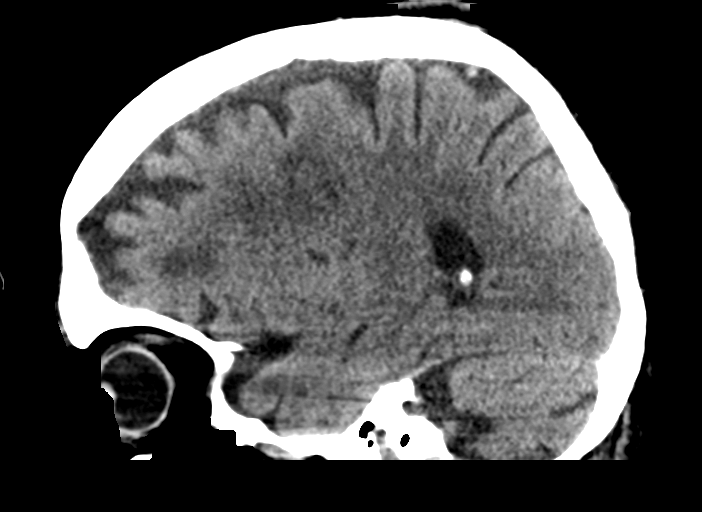
[im 29/57  brain]
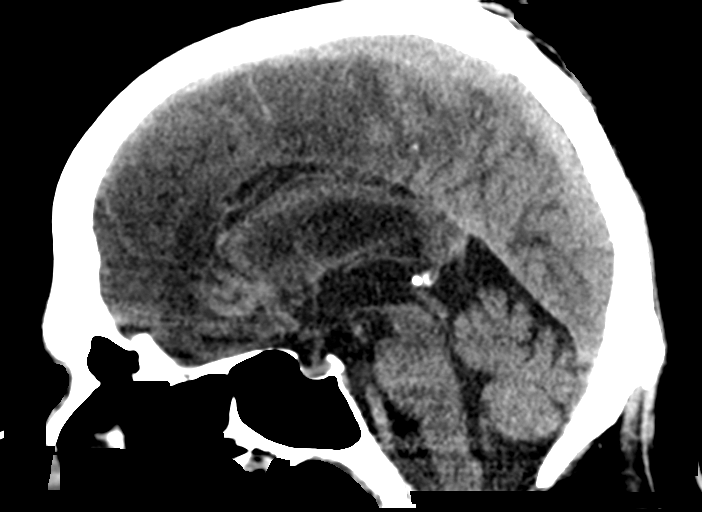
[im 38/57  brain]
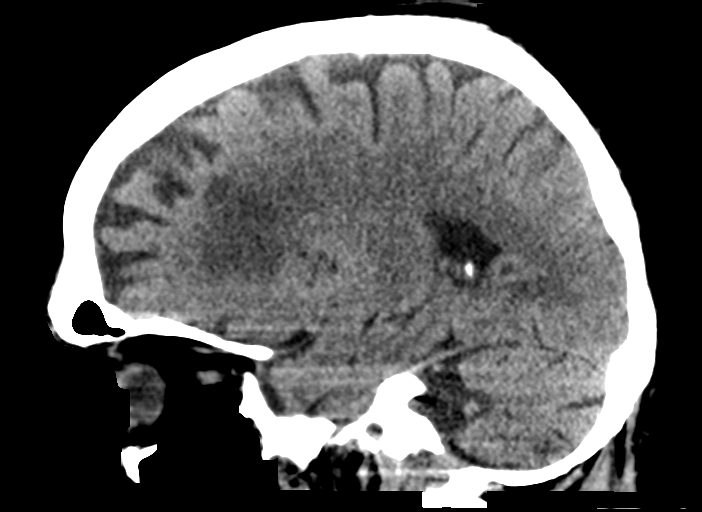

[15 of 47 positions shown; findings below may reference images not displayed]

FINDINGS: Brain: No subdural, epidural, or subarachnoid hemorrhage. No mass,
mass effect, or midline shift. Ventricles and sulci are stable. The
cerebellum is normal. There is a small lacunar infarct is seen in
the pons, unchanged and chronic in appearance. Basal cisterns are
widely patent. No mass, mass effect, or midline shift. Scattered
lacunar infarcts in the basal ganglia. Moderate to severe white
matter changes, stable. No acute cortical ischemia or infarct.

Vascular: Calcified atherosclerosis is seen in the intracranial
carotid arteries.

Skull: Normal. Negative for fracture or focal lesion.

Sinuses/Orbits: There is debris in the right sphenoid sinus. The
remainder of the paranasal sinuses and the left mastoid air cells
are normal. The middle ears are well aerated. There is opacification
of the inferior right mastoid air cells with associated sclerosis,
unchanged. This likely represents a region of previous mastoiditis.
No adjacent soft tissue swelling or erosion to suggest an acute
process.

Other: Extracranial soft tissues are normal.
IMPRESSION: 1. No acute intracranial process.
2. Debris in the right sphenoid sinus. Opacification and sclerosis
in the inferior right mastoid air cells, chronic, of no acute
significance.

## 2018-07-25 NOTE — Progress Notes (Signed)
Error

## 2018-07-26 ENCOUNTER — Ambulatory Visit (INDEPENDENT_AMBULATORY_CARE_PROVIDER_SITE_OTHER): Payer: Medicare Other | Admitting: Urology

## 2018-07-26 ENCOUNTER — Encounter: Payer: Self-pay | Admitting: Urology

## 2018-07-26 VITALS — BP 114/71 | HR 65

## 2018-07-26 DIAGNOSIS — N200 Calculus of kidney: Secondary | ICD-10-CM | POA: Diagnosis not present

## 2018-07-26 DIAGNOSIS — R339 Retention of urine, unspecified: Secondary | ICD-10-CM

## 2018-07-26 NOTE — Progress Notes (Signed)
07/26/2018 7:25 PM   [name] 1941-02-21 [id number]  Referring provider: Angela Coxasanayaka, Gayani Y, MD [address] [place], [place] [id number]  Chief Complaint  Patient presents with  . Follow-up    HPI: 77 yo WM with a history of nephrolithiasis, a history of  urinary retention who is s/p SPT placement, a history of urethral erosion and a history of hematuria who presents today for a recheck following a cystoscopy.    His SPT changes are being managed on a monthly basis by home health. He has not reported any urinary symptoms.   History of nephrolithiasis Patient with a finding of a right renal stone on CT in 12/2016 measuring 1.3 cm which was increased in size from prior CT in 2016.  Patient has a history of UTI with sepsis.  He underwent right ESWL on 05/05/2017 and the stone appeared to fragmented during the procedure.  Right stone seen on 05/2017 KUB.  His KUB on 04/26/2018 revealed no urolithiasis per radiologist report, but reviewing the films there are calcifications that are suspicious for bilateral nephrolithiasis.    History of urinary retention He has had several episodes of urinary retention with large residuals, 500-900 cc.  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  He has completed a CT guided SPT placement with serial dilation on 06/28/2017.  Patient's caregiver would like to have cranberry tablets to clear the urine.  Patient's tube was exchanged on 07/19/2018 by home health care.    History of hematuria Contrast CT in 12/2016 noted an unremarkable adrenal glands. Nonobstructing right renal calculi measuring up to 1.3 cm in size, overall slightly increased from the prior CT. 5.1 cm right upper pole renal cyst has mildly enlarged as have smaller cysts in the right lower pole measuring up to 2 cm in size. Small cysts in the left kidney measure up to 2.2 cm. No hydronephrosis. Poor  intravascular enhancement on the portal venous phase images with no significant contrast excretion on delayed images, possibly reflecting an issue with the contrast injection. Foley catheter and small amount of gas in the bladder.  Cystoscopy on 04/26/2018 with Dr. Lonna CobbStoioff was NED.  He does not report any gross hematuria.    PMH significant for CHF, COPD, DVT, seizure disorder, schizophrenia and Bipolar 1 disorder.    PMH: Past Medical History:  Diagnosis Date  . Bipolar 1 disorder (HCC)   . C. difficile diarrhea 05/2016   history of ...  . CHF (congestive heart failure) (HCC)   . Chronic kidney disease 05/21/2016   acute renal failure with sepsis and uti  . COPD (chronic obstructive pulmonary disease) (HCC)   . DVT (deep venous thrombosis) (HCC)   . GERD (gastroesophageal reflux disease)   . Hematuria   . High cholesterol   . History of kidney stones   . Hypertension   . 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 Schizophrenia (HCC)   . Seizure (HCC)    on phenobarb  . Urinary retention    frequently requiring foley placement  . VRE (vancomycin resistant enterococcus) culture positive    urine    Surgical History: Past Surgical History:  Procedure Laterality Date  . EXTRACORPOREAL SHOCK WAVE LITHOTRIPSY Right 05/05/2017   Procedure: EXTRACORPOREAL SHOCK WAVE LITHOTRIPSY (ESWL);  Surgeon: Vanna ScotlandBrandon, Ashley, MD;  Location: [facility] ORS;  Service: Urology;  Laterality: Right;  . IR CATHETER TUBE CHANGE  06/23/2017  . IR CATHETER TUBE CHANGE  06/28/2017    Home Medications:  Allergies as of 07/26/2018      Reactions   Penicillin G Rash, Other (See Comments)   Convulsions.    Has patient had a PCN reaction causing immediate rash, facial/tongue/throat swelling, SOB or lightheadedness with hypotension: not sure Has patient had a PCN reaction causing severe rash involving mucus membranes or skin necrosis: not sure Has patient had a PCN reaction that required  hospitalization: not sure Has patient had a PCN reaction occurring within the last 10 years: not sure If all of the above answers are "NO", then may proceed with Cephalosporin use.      Medication List       Accurate as of July 26, 2018 11:59 PM. Always use your most recent med list.        acetaminophen 325 MG tablet Commonly known as:  TYLENOL Take 2 tablets (650 mg total) by mouth every 6 (six) hours as needed.   amLODipine 2.5 MG tablet Commonly known as:  NORVASC Take 1 tablet (2.5 mg total) by mouth daily.   ARIPiprazole 20 MG tablet Commonly known as:  ABILIFY Take 1 tablet by mouth daily.   aspirin EC 81 MG tablet Take by mouth.   buPROPion 300 MG 24 hr tablet Commonly known as:  BUDEPRION XL Take 1 tablet (300 mg total) by mouth daily.   carvedilol 25 MG tablet Commonly known as:  COREG Take 1 tablet (25 mg total) by mouth 2 (two) times daily.   Cranberry 300 MG tablet Take 1 tablet (300 mg total) by mouth 2 (two) times daily.   feeding supplement (ENSURE ENLIVE) Liqd Take 237 mLs by mouth 2 (two) times daily between meals.   finasteride 5 MG tablet Commonly known as:  PROSCAR Take 1 tablet (5 mg total) by mouth daily.   furosemide 20 MG tablet Commonly known as:  LASIX Take 20 mg by mouth.   MULTI-VITAMINS Tabs Take by mouth.   omeprazole 20 MG capsule Commonly known as:  PRILOSEC Take 2 capsules (40 mg total) by mouth daily.   PHENobarbital 64.8 MG tablet Commonly known as:  LUMINAL Take 2 tablets (129.6 mg total) by mouth every other day.   potassium chloride SA 20 MEQ tablet Commonly known as:  K-DUR,KLOR-CON Take 40 mEq by mouth 2 (two) times daily.   pravastatin 40 MG tablet Commonly known as:  PRAVACHOL Take 1 tablet (40 mg total) by mouth daily.   tamsulosin 0.4 MG Caps capsule Commonly known as:  FLOMAX Take 1 capsule (0.4 mg total) by mouth daily.   VENTOLIN HFA 108 (90 Base) MCG/ACT inhaler Generic drug:   albuterol Inhale 2 puffs into the lungs every 6 (six) hours as needed for shortness of breath.       Allergies:  Allergies  Allergen Reactions  . Penicillin G Rash and Other (See Comments)    Convulsions.    Has patient had a PCN reaction causing immediate rash, facial/tongue/throat swelling, SOB or lightheadedness with hypotension: not sure Has patient had a PCN reaction causing severe rash involving mucus membranes or skin necrosis: not sure Has patient had a PCN reaction that required hospitalization: not sure Has patient had a PCN reaction occurring within the last 10 years: not sure If all of the above answers are "NO", then may proceed with Cephalosporin use.    Family History: Family History  Problem Relation Age of Onset  . Cancer Mother   .  Prostate cancer Neg Hx   . Kidney disease Neg Hx   . Kidney cancer Neg Hx   . Bladder Cancer Neg Hx     Social History:  reports that he has never smoked. He has never used smokeless tobacco. He reports that he does not drink alcohol or use drugs.  ROS: UROLOGY Frequent Urination?: No Hard to postpone urination?: No Burning/pain with urination?: No Get up at night to urinate?: No Leakage of urine?: No Urine stream starts and stops?: No Trouble starting stream?: No Do you have to strain to urinate?: No Blood in urine?: No Urinary tract infection?: No Sexually transmitted disease?: No Injury to kidneys or bladder?: No Painful intercourse?: No Weak stream?: No Erection problems?: No Penile pain?: No  Gastrointestinal Nausea?: No Vomiting?: No Indigestion/heartburn?: No Diarrhea?: No Constipation?: No  Constitutional Fever: No Night sweats?: No Weight loss?: No Fatigue?: No  Skin Skin rash/lesions?: No Itching?: No  Eyes Blurred vision?: No Double vision?: No  Ears/Nose/Throat Sore throat?: No Sinus problems?: No  Hematologic/Lymphatic Swollen glands?: No Easy bruising?: No  Cardiovascular Leg  swelling?: No Chest pain?: No  Respiratory Cough?: No Shortness of breath?: No  Endocrine Excessive thirst?: No  Musculoskeletal Back pain?: No Joint pain?: No  Neurological Headaches?: No Dizziness?: No  Psychologic Depression?: No Anxiety?: No  Physical Exam: BP 114/71 (BP Location: Left Arm, Patient Position: Sitting)   Pulse 65   Constitutional: Well nourished. Alert and oriented, No acute distress. HEENT: [place] AT, moist mucus membranes. Trachea midline, no masses. Cardiovascular: No clubbing, cyanosis, or edema. Respiratory: Normal respiratory effort, no increased work of breathing. Skin: No rashes, bruises or suspicious lesions. Neurologic: Patient in a wheelchair Psychiatric: Normal mood and affect.  Laboratory Data: Creatinine 1.19 on 12/2017 I have reviewed the labs.  Pertinent Imagings:  CLINICAL DATA:  Nephrolithiasis  EXAM: ABDOMEN - 1 VIEW  COMPARISON:  None.  FINDINGS: There is a large amount of stool throughout the colon. There is no bowel dilatation to suggest obstruction. There is no evidence of pneumoperitoneum, portal venous gas or pneumatosis.  There are no pathologic calcifications along the expected course of the ureters.  The osseous structures are unremarkable.  IMPRESSION: No urolithiasis.   Electronically Signed   By: Elige Ko   On: 04/26/2018 13:59  I have independently reviewed the films and bilateral nephrolithiasis   Assessment & Plan:    1. History of right renal stone s/p right ESWL on 05/05/2017  Fragments never returned for analysis Right renal stone seen on 05/2017 KUB Schedule for RUS   2. Urethral erosion Cystoscopy on 04/26/2018 was negative  SPT was changed in the office today - he will have home health continue SPT exchanges at home - advised to keep the SPT off tension - he will follow up with [place] in 3 months  3. Urinary retention:    SPT in place Patient will need a cystoscopy in 04/2019  for surveillance   4. BPH with LUTS  Continue finasteride 5 mg daily   5. History of hematuria CTU in 12/23/2016 - findings positive for non obstructing right nephrolithiasis and cysto in 04/2018 was negative  No report of gross hematuria  RTC in one year for UA - patient to report any gross hematuria in the interim    Return for cysto in september and return sooner if  urinary issues .  These notes generated with voice recognition software. I apologize for typographical errors.  Michiel Cowboy, PA-C  [facility] [address] [place], [place] [address] [phone]  I, Donne Hazel, am acting as a Neurosurgeon for [employer],  I have reviewed the above documentation for accuracy and completeness, and I agree with the above.    Michiel Cowboy, PA-C

## 2018-08-14 ENCOUNTER — Ambulatory Visit: Payer: Medicare Other

## 2018-08-17 ENCOUNTER — Ambulatory Visit: Payer: Medicare Other

## 2019-04-18 IMAGING — CR DG ABDOMEN 1V
1 series · 2 of 2 positions shown · non-contrast
Comparison: CT abdomen pelvis 12/23/2016

CLINICAL DATA: History of right renal calculus

EXAM:
ABDOMEN - 1 VIEW

[Series 1: dg abd 1 view · 0.14mm/px · 2 of 2 slices shown]
[im 1/2]
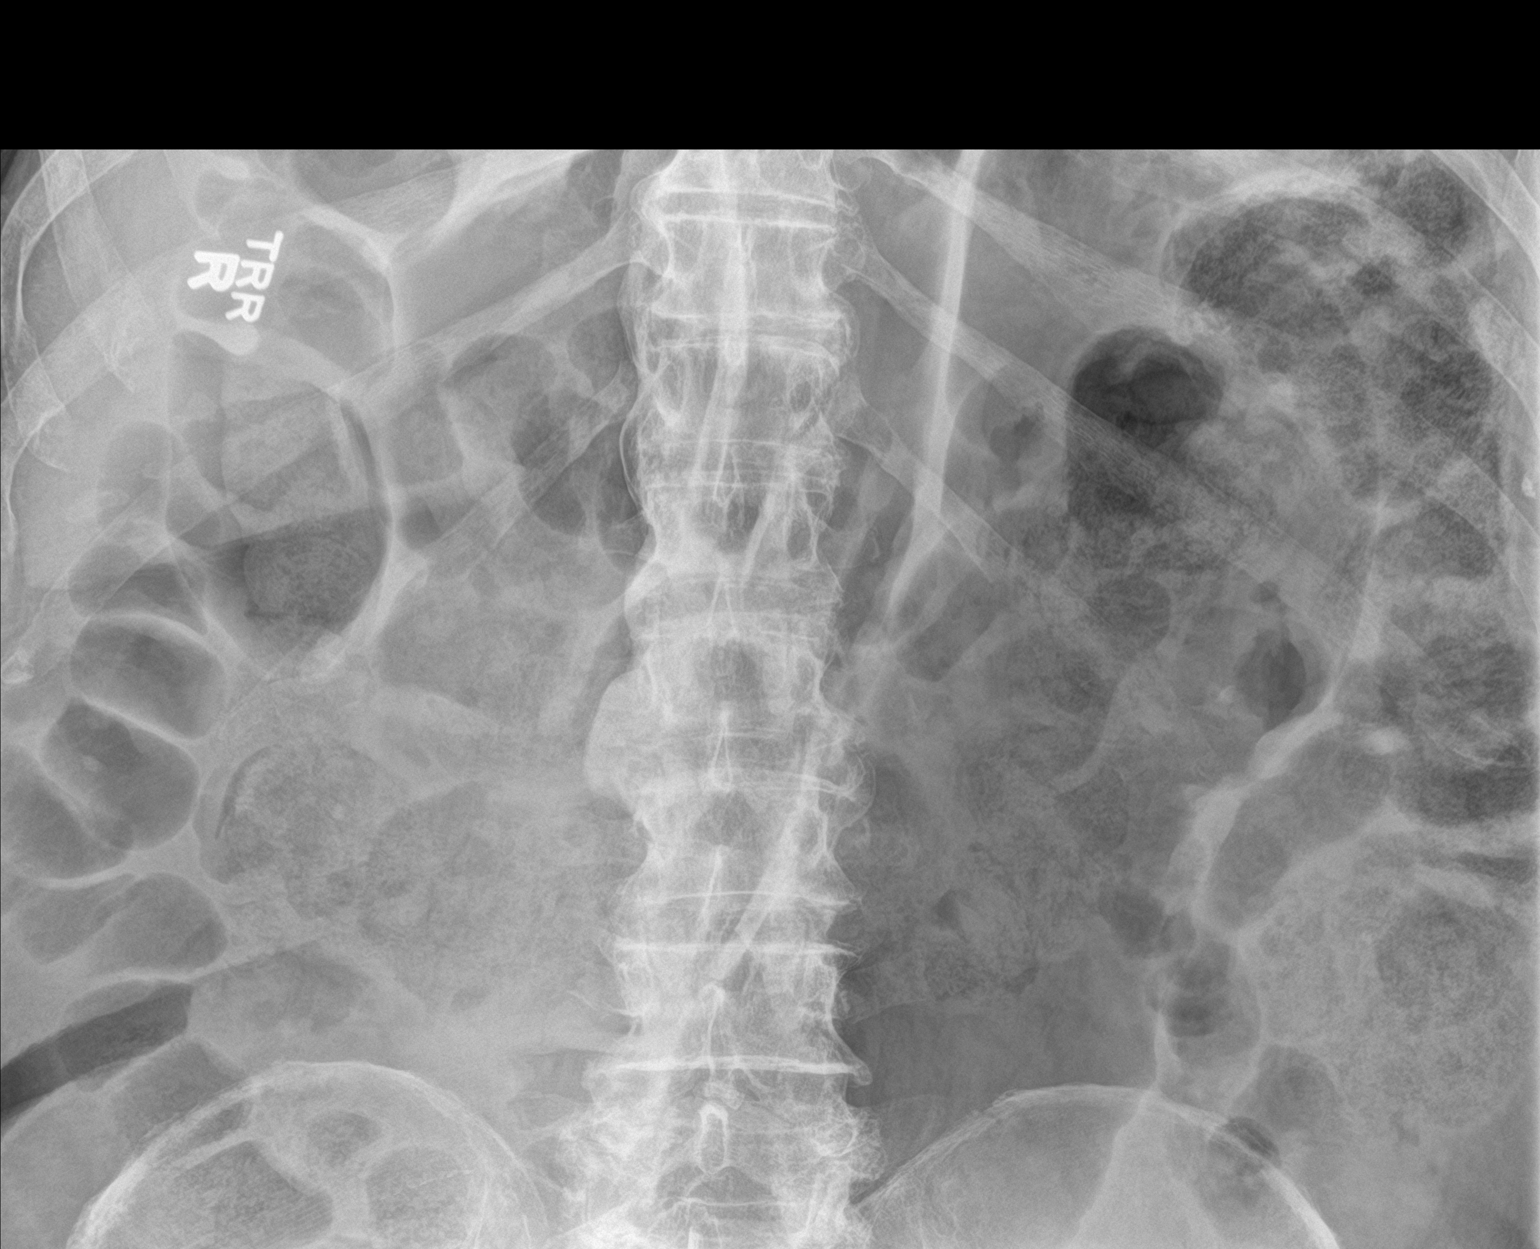
[im 2/2]
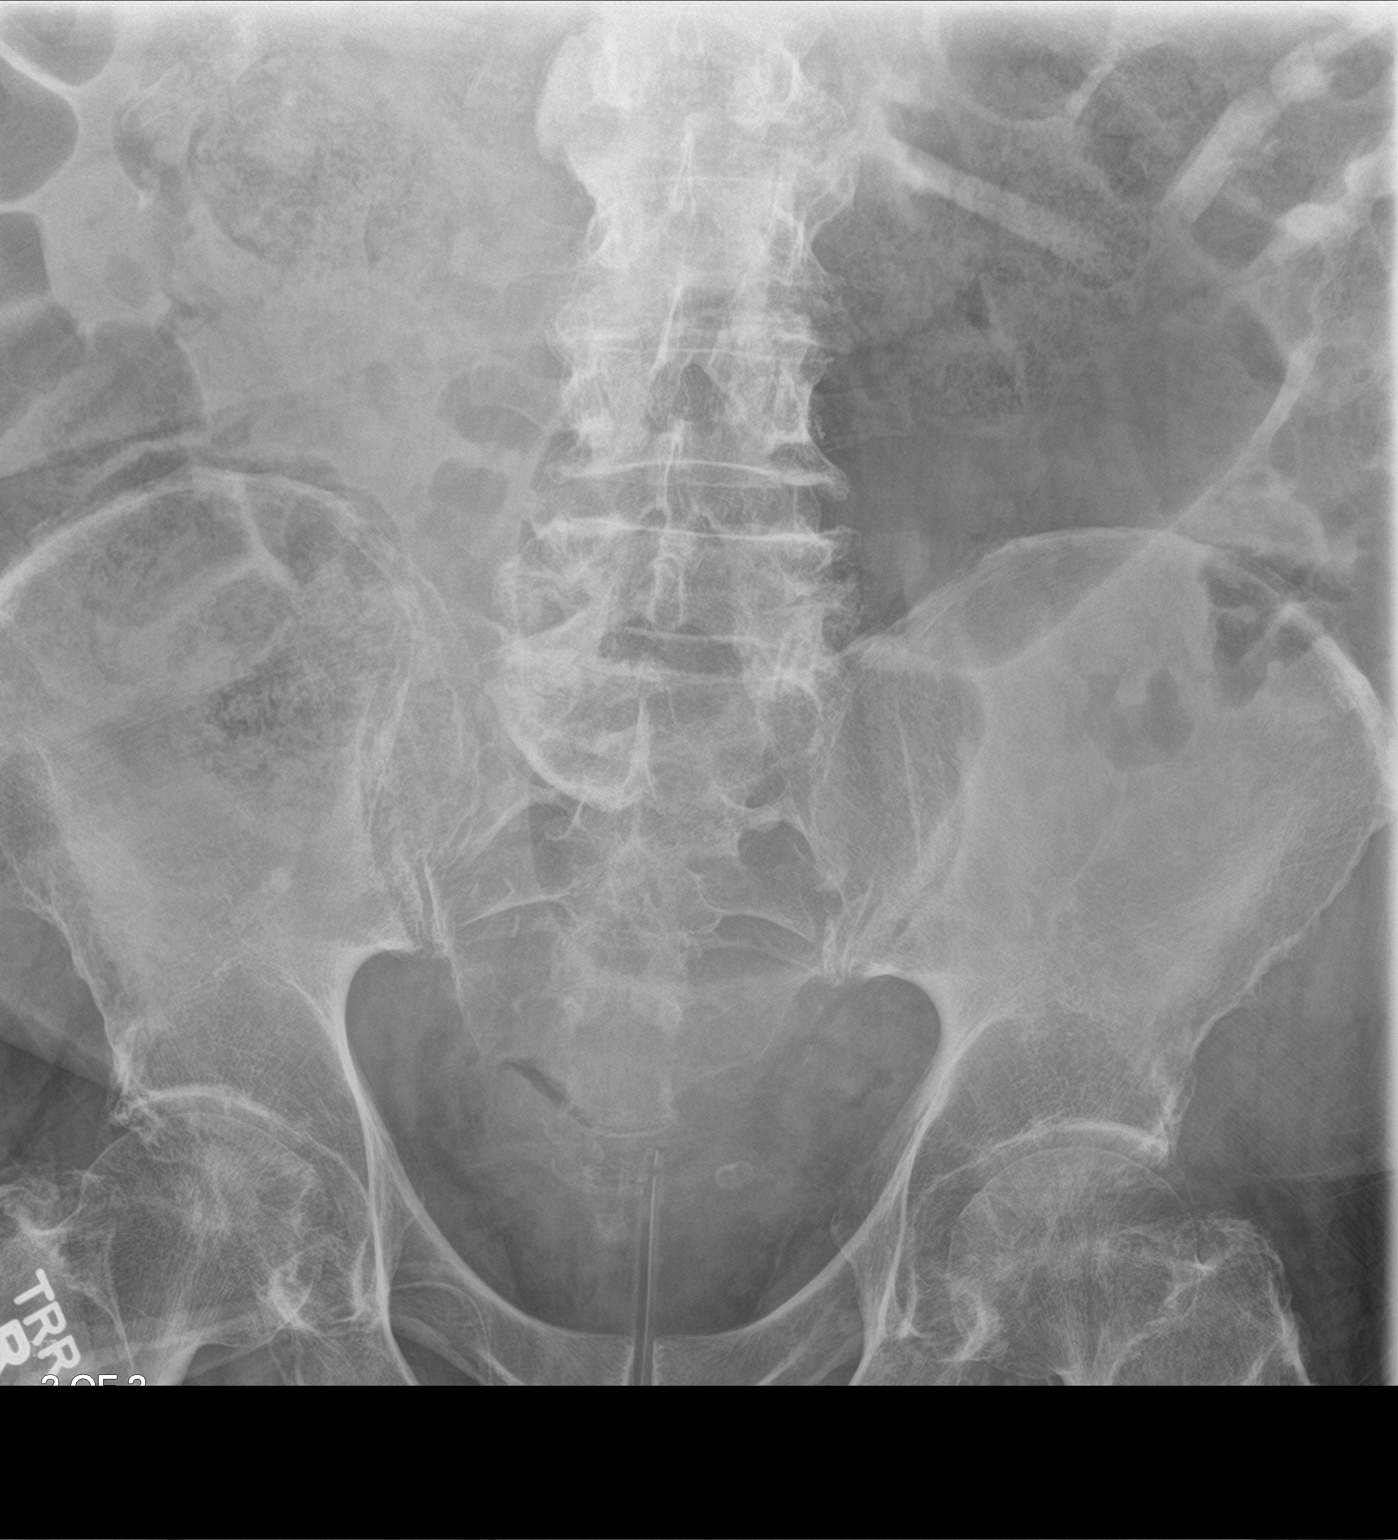

[2 of 2 positions shown; findings below may reference images not displayed]

FINDINGS: There is somewhat bowel gas and feces overlying the renal shadows
that the CT demonstrated right renal calculus is very difficult to
see. There [DATE] right renal calculi present although these are
difficult visualize with certainty. Large amount of bowel gas is
present particularly within the colon as noted on the prior CT.
IMPRESSION: The CT demonstrated right renal calculi are very difficult to
definitely visualize in view of the overlying bowel gas and feces.

## 2019-04-20 ENCOUNTER — Other Ambulatory Visit: Payer: Medicare Other | Admitting: Urology

## 2019-04-20 ENCOUNTER — Encounter: Payer: Self-pay | Admitting: Urology

## 2021-04-10 ENCOUNTER — Observation Stay
Admission: EM | Admit: 2021-04-10 | Discharge: 2021-04-11 | Disposition: A | Discharge status: Home / Self Care | Payer: Medicare Other | Attending: Family Medicine | Admitting: Family Medicine

## 2021-04-10 ENCOUNTER — Encounter: Payer: Self-pay | Admitting: Emergency Medicine

## 2021-04-10 ENCOUNTER — Emergency Department: Payer: Medicare Other

## 2021-04-10 ENCOUNTER — Other Ambulatory Visit: Payer: Self-pay

## 2021-04-10 DIAGNOSIS — L03115 Cellulitis of right lower limb: Secondary | ICD-10-CM | POA: Diagnosis not present

## 2021-04-10 DIAGNOSIS — J45909 Unspecified asthma, uncomplicated: Secondary | ICD-10-CM | POA: Insufficient documentation

## 2021-04-10 DIAGNOSIS — Z79899 Other long term (current) drug therapy: Secondary | ICD-10-CM | POA: Insufficient documentation

## 2021-04-10 DIAGNOSIS — K219 Gastro-esophageal reflux disease without esophagitis: Secondary | ICD-10-CM

## 2021-04-10 DIAGNOSIS — I2510 Atherosclerotic heart disease of native coronary artery without angina pectoris: Secondary | ICD-10-CM | POA: Insufficient documentation

## 2021-04-10 DIAGNOSIS — E782 Mixed hyperlipidemia: Secondary | ICD-10-CM

## 2021-04-10 DIAGNOSIS — L97121 Non-pressure chronic ulcer of left thigh limited to breakdown of skin: Secondary | ICD-10-CM

## 2021-04-10 DIAGNOSIS — N189 Chronic kidney disease, unspecified: Secondary | ICD-10-CM | POA: Diagnosis not present

## 2021-04-10 DIAGNOSIS — Z20822 Contact with and (suspected) exposure to covid-19: Secondary | ICD-10-CM | POA: Diagnosis not present

## 2021-04-10 DIAGNOSIS — I83021 Varicose veins of left lower extremity with ulcer of thigh: Secondary | ICD-10-CM

## 2021-04-10 DIAGNOSIS — I509 Heart failure, unspecified: Secondary | ICD-10-CM | POA: Diagnosis not present

## 2021-04-10 DIAGNOSIS — I10 Essential (primary) hypertension: Secondary | ICD-10-CM | POA: Diagnosis not present

## 2021-04-10 DIAGNOSIS — M7989 Other specified soft tissue disorders: Secondary | ICD-10-CM | POA: Diagnosis present

## 2021-04-10 DIAGNOSIS — I130 Hypertensive heart and chronic kidney disease with heart failure and stage 1 through stage 4 chronic kidney disease, or unspecified chronic kidney disease: Secondary | ICD-10-CM | POA: Diagnosis not present

## 2021-04-10 DIAGNOSIS — J449 Chronic obstructive pulmonary disease, unspecified: Secondary | ICD-10-CM | POA: Insufficient documentation

## 2021-04-10 DIAGNOSIS — M549 Dorsalgia, unspecified: Secondary | ICD-10-CM | POA: Diagnosis present

## 2021-04-10 DIAGNOSIS — L0390 Cellulitis, unspecified: Secondary | ICD-10-CM | POA: Diagnosis present

## 2021-04-10 DIAGNOSIS — R609 Edema, unspecified: Secondary | ICD-10-CM

## 2021-04-10 LAB — CBC WITH DIFFERENTIAL/PLATELET
Abs Immature Granulocytes: 0.02 10*3/uL (ref 0.00–0.07)
Basophils Absolute: 0 10*3/uL (ref 0.0–0.1)
Basophils Relative: 1 %
Eosinophils Absolute: 0.1 10*3/uL (ref 0.0–0.5)
Eosinophils Relative: 2 %
HCT: 27.9 % — ABNORMAL LOW (ref 39.0–52.0)
Hemoglobin: 9.4 g/dL — ABNORMAL LOW (ref 13.0–17.0)
Immature Granulocytes: 0 %
Lymphocytes Relative: 27 %
Lymphs Abs: 1.3 10*3/uL (ref 0.7–4.0)
MCH: 30.2 pg (ref 26.0–34.0)
MCHC: 33.7 g/dL (ref 30.0–36.0)
MCV: 89.7 fL (ref 80.0–100.0)
Monocytes Absolute: 0.3 10*3/uL (ref 0.1–1.0)
Monocytes Relative: 7 %
Neutro Abs: 3 10*3/uL (ref 1.7–7.7)
Neutrophils Relative %: 63 %
Platelets: 203 10*3/uL (ref 150–400)
RBC: 3.11 MIL/uL — ABNORMAL LOW (ref 4.22–5.81)
RDW: 14.7 % (ref 11.5–15.5)
WBC: 4.7 10*3/uL (ref 4.0–10.5)
nRBC: 0 % (ref 0.0–0.2)

## 2021-04-10 LAB — URINALYSIS, ROUTINE W REFLEX MICROSCOPIC
Bilirubin Urine: NEGATIVE
Glucose, UA: NEGATIVE mg/dL
Ketones, ur: NEGATIVE mg/dL
Nitrite: POSITIVE — AB
Protein, ur: 100 mg/dL — AB
RBC / HPF: >50 RBC/hpf — ABNORMAL HIGH (ref 0–5)
Specific Gravity, Urine: 1.02 (ref 1.005–1.030)
WBC, UA: >50 WBC/hpf — ABNORMAL HIGH (ref 0–5)
pH: 7.5 (ref 5.0–8.0)

## 2021-04-10 LAB — COMPREHENSIVE METABOLIC PANEL
ALT: 8 U/L (ref 0–44)
AST: 11 U/L — ABNORMAL LOW (ref 15–41)
Albumin: 2.5 g/dL — ABNORMAL LOW (ref 3.5–5.0)
Alkaline Phosphatase: 77 U/L (ref 38–126)
Anion gap: 4 — ABNORMAL LOW (ref 5–15)
BUN: 13 mg/dL (ref 8–23)
CO2: 27 mmol/L (ref 22–32)
Calcium: 7.9 mg/dL — ABNORMAL LOW (ref 8.9–10.3)
Chloride: 109 mmol/L (ref 98–111)
Creatinine, Ser: 0.92 mg/dL (ref 0.61–1.24)
GFR, Estimated: >60 mL/min (ref >60)
Glucose, Bld: 89 mg/dL (ref 70–99)
Potassium: 3.7 mmol/L (ref 3.5–5.1)
Sodium: 140 mmol/L (ref 135–145)
Total Bilirubin: 0.6 mg/dL (ref 0.3–1.2)
Total Protein: 5.8 g/dL — ABNORMAL LOW (ref 6.5–8.1)

## 2021-04-10 LAB — BRAIN NATRIURETIC PEPTIDE: B Natriuretic Peptide: 421.3 pg/mL — ABNORMAL HIGH (ref 0.0–100.0)

## 2021-04-10 LAB — LACTIC ACID, PLASMA: Lactic Acid, Venous: 1.1 mmol/L (ref 0.5–1.9)

## 2021-04-10 LAB — SEDIMENTATION RATE: Sed Rate: 62 mm/hr — ABNORMAL HIGH (ref 0–20)

## 2021-04-10 MED ORDER — ACETAMINOPHEN 650 MG RE SUPP: 650.0000 mg | Freq: Four times a day (QID) | RECTAL | Status: DC | PRN

## 2021-04-10 MED ORDER — MORPHINE SULFATE (PF) 2 MG/ML IV SOLN
0.5000 mg | INTRAVENOUS | Status: DC | PRN
  Administered 2021-04-11: 03:00:00 0.5 mg via INTRAVENOUS
  Filled 2021-04-10: qty 1

## 2021-04-10 MED ORDER — PANTOPRAZOLE SODIUM 40 MG PO TBEC
80.0000 mg | DELAYED_RELEASE_TABLET | Freq: Every day | ORAL | Status: DC
  Administered 2021-04-11: 09:00:00 80 mg via ORAL
  Filled 2021-04-10: qty 2

## 2021-04-10 MED ORDER — DILTIAZEM HCL 25 MG/5ML IV SOLN
5.0000 mg | INTRAVENOUS | Status: DC | PRN
  Filled 2021-04-10: qty 5

## 2021-04-10 MED ORDER — SODIUM CHLORIDE 0.9 % IV SOLN
2.0000 g | Freq: Once | INTRAVENOUS | Status: AC
  Administered 2021-04-10: 2 g via INTRAVENOUS
  Filled 2021-04-10: qty 20

## 2021-04-10 MED ORDER — SODIUM CHLORIDE 0.9 % IV SOLN
2.0000 g | INTRAVENOUS | Status: DC
  Filled 2021-04-10: qty 20

## 2021-04-10 MED ORDER — ACETAMINOPHEN 325 MG PO TABS: 650.0000 mg | ORAL_TABLET | Freq: Four times a day (QID) | ORAL | Status: DC | PRN

## 2021-04-10 MED ORDER — ONDANSETRON HCL 4 MG/2ML IJ SOLN: 4.0000 mg | Freq: Four times a day (QID) | INTRAMUSCULAR | Status: DC | PRN

## 2021-04-10 MED ORDER — PRAVASTATIN SODIUM 20 MG PO TABS
40.0000 mg | ORAL_TABLET | Freq: Every day | ORAL | Status: DC
  Administered 2021-04-11: 09:00:00 40 mg via ORAL
  Filled 2021-04-10: qty 2

## 2021-04-10 MED ORDER — ONDANSETRON HCL 4 MG PO TABS: 4.0000 mg | ORAL_TABLET | Freq: Four times a day (QID) | ORAL | Status: DC | PRN

## 2021-04-10 MED ORDER — TAMSULOSIN HCL 0.4 MG PO CAPS
0.4000 mg | ORAL_CAPSULE | Freq: Every day | ORAL | Status: DC
  Administered 2021-04-11: 09:00:00 0.4 mg via ORAL
  Filled 2021-04-10: qty 1

## 2021-04-10 MED ORDER — ALBUTEROL SULFATE (2.5 MG/3ML) 0.083% IN NEBU: 2.5000 mg | INHALATION_SOLUTION | Freq: Four times a day (QID) | RESPIRATORY_TRACT | Status: DC | PRN

## 2021-04-10 MED ORDER — CARVEDILOL 25 MG PO TABS
25.0000 mg | ORAL_TABLET | Freq: Two times a day (BID) | ORAL | Status: DC
  Administered 2021-04-11: 09:00:00 25 mg via ORAL
  Filled 2021-04-10: qty 1

## 2021-04-10 MED ORDER — ENOXAPARIN SODIUM 60 MG/0.6ML IJ SOSY: 0.5000 mg/kg | PREFILLED_SYRINGE | Freq: Every day | INTRAMUSCULAR | Status: DC

## 2021-04-10 MED ORDER — FINASTERIDE 5 MG PO TABS
5.0000 mg | ORAL_TABLET | Freq: Every day | ORAL | Status: DC
  Administered 2021-04-11: 09:00:00 5 mg via ORAL
  Filled 2021-04-10: qty 1

## 2021-04-10 NOTE — ED Provider Notes (Signed)
[facility] Emergency Department Provider Note  ____________________________________________   None    (approximate)  I have reviewed the triage vital signs and the nursing notes.   HISTORY  Chief Complaint Leg Swelling    HPI [name] is a 80 y.o. male with history of CHF, COPD, schizophrenia, here with right leg pain and swelling.  The patient states that for the last week, he has had progressively worsening, severe, right leg pain and swelling.  He has noticed chronic redness of his bilateral shins but has now had worsening redness extending up his leg on the right.  Has had associated increase in asymmetric leg swelling.  The pain is 8 out of 10, aching, throbbing, and worse with any kind of movement or weightbearing.  He said difficulty ambulating due to this.  Denies any trauma.  He states he had some mild itching prior to the onset of pain and redness.  He does not believe he was bit by anything.  States he is also noticed some foul-smelling urine and he has a chronic suprapubic catheter.  No nausea or vomiting.  No other complaints.  No specific alleviating factors.    Past Medical History:  Diagnosis Date   Bipolar 1 disorder (HCC)    C. difficile diarrhea 05/2016   history of ...   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   Schizophrenia (HCC)    Seizure (HCC)    on phenobarb   Urinary retention    frequently requiring foley placement   VRE (vancomycin resistant enterococcus) culture positive    urine    Patient Active Problem List   Diagnosis Date Noted   Cellulitis 04/10/2021    Proctocolitis 12/23/2016   Acute posthemorrhagic anemia 11/25/2016   Hematuria 11/25/2016   Guaiac positive stools 11/25/2016   Acute renal insufficiency 11/25/2016   Pressure injury of skin 09/23/2016   Back pain 05/28/2016   Sepsis (HCC) 05/27/2016   UTI (urinary tract infection) 05/27/2016   Escherichia coli (E. 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   Pneumonia 02/21/2015   ARF (acute renal failure) (HCC) 02/21/2015   Hypokalemia 02/21/2015   Leukocytosis 02/21/2015   Venous insufficiency of both lower extremities 12/04/2014   Benign essential hypertension 11/25/2014   Bilateral carotid artery stenosis 11/15/2014   Bilateral leg edema 11/15/2014   COPD (chronic obstructive pulmonary disease) (HCC) 11/15/2014   Gastroesophageal reflux disease 11/15/2014   Mild aortic valve stenosis 11/15/2014   Mixed hyperlipidemia 11/15/2014   Obesity 11/15/2014   Seizure disorder (HCC) 11/15/2014    Past Surgical History:  Procedure Laterality Date   EXTRACORPOREAL SHOCK  WAVE LITHOTRIPSY Right 05/05/2017   Procedure: EXTRACORPOREAL SHOCK WAVE LITHOTRIPSY (ESWL);  Surgeon: Vanna Scotland, MD;  Location: [facility] ORS;  Service: Urology;  Laterality: Right;   IR CATHETER TUBE CHANGE  06/23/2017   IR CATHETER TUBE CHANGE  06/28/2017    Prior to Admission medications   Medication Sig Start Date End Date Taking? Authorizing Provider  acetaminophen (TYLENOL) 325 MG tablet Take 2 tablets (650 mg total) by mouth every 6 (six)  hours as needed. 04/26/16   Alford Highland, MD  albuterol (VENTOLIN HFA) 108 (90 Base) MCG/ACT inhaler Inhale 2 puffs into the lungs every 6 (six) hours as needed for shortness of breath.    [provider]  amLODipine (NORVASC) 2.5 MG tablet Take 1 tablet (2.5 mg total) by mouth daily. 04/26/16   Alford Highland, MD  ARIPiprazole (ABILIFY) 20 MG tablet Take 1 tablet by mouth daily.    [provider]  buPROPion (BUDEPRION XL) 300 MG 24 hr tablet Take 1 tablet (300 mg total) by mouth daily. 04/26/16   Alford Highland, MD  carvedilol (COREG) 25 MG tablet Take 1 tablet (25 mg total) by mouth 2 (two) times daily. 04/26/16   Alford Highland, MD  Cranberry 300 MG tablet Take 1 tablet (300 mg total) by mouth 2 (two) times daily. 01/23/18   Michiel Cowboy A, PA-C  feeding supplement, ENSURE ENLIVE, (ENSURE ENLIVE) LIQD Take 237 mLs by mouth 2 (two) times daily between meals. Patient not taking: Reported on 07/26/2018 12/25/16   Enid Baas, MD  finasteride (PROSCAR) 5 MG tablet Take 1 tablet (5 mg total) by mouth daily. 04/27/16   Alford Highland, MD  furosemide (LASIX) 20 MG tablet Take 20 mg by mouth.    [provider]  Multiple Vitamin (MULTI-VITAMINS) TABS Take by mouth.    [provider]  omeprazole (PRILOSEC) 20 MG capsule Take 2 capsules (40 mg total) by mouth daily. Patient taking differently: Take 40 mg by mouth at bedtime.  04/26/16   Alford Highland, MD  PHENobarbital (LUMINAL) 64.8 MG tablet Take 2 tablets (129.6 mg total) by mouth every other day. 04/26/16   Alford Highland, MD  potassium chloride SA (K-DUR,KLOR-CON) 20 MEQ tablet Take 40 mEq by mouth 2 (two) times daily.     [provider]  pravastatin (PRAVACHOL) 40 MG tablet Take 1 tablet (40 mg total) by mouth daily. 04/26/16   Alford Highland, MD  tamsulosin (FLOMAX) 0.4 MG CAPS capsule Take 1 capsule (0.4 mg total) by mouth daily. 06/16/16   Michiel Cowboy A, PA-C     Allergies Penicillin g  Family History  Problem Relation Age of Onset   Cancer Mother    Prostate cancer Neg Hx    Kidney disease Neg Hx    Kidney cancer Neg Hx    Bladder Cancer Neg Hx     Social History Social History   Tobacco Use   Smoking status: Never   Smokeless tobacco: Never  Vaping Use   Vaping Use: Never used  Substance Use Topics   Alcohol use: No   Drug use: No    Review of Systems  Review of Systems  Constitutional:  Positive for fatigue. Negative for chills and fever.  HENT:  Negative for sore throat.   Respiratory:  Negative for shortness of breath.   Cardiovascular:  Positive for leg swelling. Negative for chest pain.  Gastrointestinal:  Negative for abdominal pain.  Genitourinary:  Negative for flank pain.  Musculoskeletal:  Positive for gait problem. Negative  for neck pain.  Skin:  Positive for rash. Negative for wound.  Allergic/Immunologic: Negative for immunocompromised state.  Neurological:  Negative for weakness and numbness.  Hematological:  Does not bruise/bleed easily.  All other systems reviewed and are negative.   ____________________________________________  PHYSICAL EXAM:      VITAL SIGNS: ED Triage Vitals  Enc Vitals Group     BP 04/10/21 1716 134/83     Pulse Rate 04/10/21 1716 73     Resp 04/10/21 1716 18     Temp 04/10/21 1716 98.7 F (37.1 C)     Temp Source 04/10/21 1716 Oral     SpO2 04/10/21 1716 98 %     Weight 04/10/21 1717 247 lb (112 kg)     Height 04/10/21 1717 5\' 10"  (1.778 m)     Head Circumference --      Peak Flow --      Pain Score 04/10/21 1716 10     Pain Loc --      Pain Edu? --      Excl. in GC? --      Physical Exam Vitals and nursing note reviewed.  Constitutional:      General: He is not in acute distress.    Appearance: He is well-developed.  HENT:     Head: Normocephalic and atraumatic.  Eyes:     Conjunctiva/sclera: Conjunctivae normal.  Cardiovascular:     Rate and Rhythm:  Normal rate and regular rhythm.     Heart sounds: Normal heart sounds.  Pulmonary:     Effort: Pulmonary effort is normal. No respiratory distress.     Breath sounds: No wheezing.  Abdominal:     General: Abdomen is flat. There is no distension.     Tenderness: There is abdominal tenderness (Mild suprapubic tenderness around his suprapubic catheter.  Foul-smelling urine noted.).  Musculoskeletal:     Cervical back: Neck supple.  Skin:    General: Skin is warm.     Capillary Refill: Capillary refill takes less than 2 seconds.     Findings: No rash.     Comments: Pitting edema bilateral lower extremities.  There is marked asymmetric edema of the right lower extremity with increasing redness and warmth extending throughout the shin to just below the knee.  There is tenderness to palpation.  No fluctuance.  No open wounds.  Neurological:     Mental Status: He is alert and oriented to person, place, and time.     Motor: No abnormal muscle tone.      ____________________________________________   LABS (all labs ordered are listed, but only abnormal results are displayed)  Labs Reviewed  COMPREHENSIVE METABOLIC PANEL - Abnormal; Notable for the following components:      Result Value   Calcium 7.9 (*)    Total Protein 5.8 (*)    Albumin 2.5 (*)    AST 11 (*)    Anion gap 4 (*)    All other components within normal limits  CBC WITH DIFFERENTIAL/PLATELET - Abnormal; Notable for the following components:   RBC 3.11 (*)    Hemoglobin 9.4 (*)    HCT 27.9 (*)    All other components within normal limits  URINALYSIS, ROUTINE W REFLEX MICROSCOPIC - Abnormal; Notable for the following components:   Color, Urine YELLOW (*)    APPearance CLOUDY (*)    Hgb urine dipstick LARGE (*)    Protein, ur 100 (*)    Nitrite POSITIVE (*)    Leukocytes,Ua  MODERATE (*)    RBC / HPF >50 (*)    WBC, UA >50 (*)    Bacteria, UA MANY (*)    All other components within normal limits  BRAIN NATRIURETIC  PEPTIDE - Abnormal; Notable for the following components:   B Natriuretic Peptide 421.3 (*)    All other components within normal limits  SEDIMENTATION RATE - Abnormal; Notable for the following components:   Sed Rate 62 (*)    All other components within normal limits  CULTURE, BLOOD (ROUTINE X 2)  CULTURE, BLOOD (ROUTINE X 2)  URINE CULTURE  MRSA NEXT GEN BY PCR, NASAL  SARS CORONAVIRUS 2 (TAT 6-24 HRS)  LACTIC ACID, PLASMA  LACTIC ACID, PLASMA  BASIC METABOLIC PANEL  CBC    ____________________________________________  EKG:  ________________________________________  RADIOLOGY All imaging, including plain films, CT scans, and ultrasounds, independently reviewed by me, and interpretations confirmed via formal radiology reads.  ED MD interpretation:   [place]: No DVT bilaterally  Official radiology report(s): US Venous Img Lower Bilateral  Result Date: 04/10/2021 CLINICAL DATA:  Bilateral lower extremity swelling. Skin color changes. EXAM: BILATERAL LOWER EXTREMITY VENOUS DOPPLER ULTRASOUND TECHNIQUE: Gray-scale sonography with compression, as well as color and duplex ultrasound, were performed to evaluate the deep venous system(s) from the level of the common femoral vein through the popliteal and proximal calf veins. COMPARISON:  Prior study 05/24/2016 FINDINGS: VENOUS Normal compressibility of the common femoral, superficial femoral, and popliteal veins, as well as the visualized calf veins. The calf veins are poorly visualized bilaterally due to edema. Visualized portions of profunda femoral vein and great saphenous vein unremarkable. No filling defects to suggest DVT on grayscale or color Doppler imaging. Doppler waveforms show normal direction of venous flow, normal respiratory plasticity and response to augmentation. OTHER None. Limitations: Edema within the lower legs limits evaluation of the calf veins. IMPRESSION: No evidence of recurrent DVT within either leg. The calf veins are  suboptimally visualized. Electronically Signed   By: Carey Bullocks M.D.   On: 04/10/2021 21:21    ____________________________________________  PROCEDURES   Procedure(s) performed (including Critical Care):  Procedures  ____________________________________________  INITIAL IMPRESSION / MDM / ASSESSMENT AND PLAN / ED COURSE  As part of my medical decision making, I reviewed the following data within the electronic MEDICAL RECORD NUMBER Nursing notes reviewed and incorporated, Old chart reviewed, Notes from prior ED visits, and [place] Controlled Substance Database       *[name] was evaluated in Emergency Department on 04/10/2021 for the symptoms described in the history of present illness. He was evaluated in the context of the global COVID-19 pandemic, which necessitated consideration that the patient might be at risk for infection with the SARS-CoV-2 virus that causes COVID-19. Institutional protocols and algorithms that pertain to the evaluation of patients at risk for COVID-19 are in a state of rapid change based on information released by regulatory bodies including the CDC and federal and state organizations. These policies and algorithms were followed during the patient's care in the ED.  Some ED evaluations and interventions may be delayed as a result of limited staffing during the pandemic.*     Medical Decision Making:  80 yo M here with RLE pain, swelling. Exam as above, remarkable for significant asymmetric edema and erythema of RLE. DVT study reviewed and is negative. Distal pulses intact. Suspect cellulitis RLE, with underlying chronic venous stasis changes. No fever, hypotension, or signs of nec fasc/deeper infection. Labs as above -  WBC normal. CMP unremarkable. Will start empiric rocpehin given degree of nonpurulent cellulitis, difficulty ambulating 2/2 this, no h/o MRSA per review of labs. Admit to medicine.  ____________________________________________  FINAL CLINICAL  IMPRESSION(S) / ED DIAGNOSES  Final diagnoses:  Cellulitis of right leg  Pitting edema     MEDICATIONS GIVEN DURING THIS VISIT:  Medications  carvedilol (COREG) tablet 25 mg (has no administration in time range)  pravastatin (PRAVACHOL) tablet 40 mg (has no administration in time range)  pantoprazole (PROTONIX) EC tablet 80 mg (has no administration in time range)  tamsulosin (FLOMAX) capsule 0.4 mg (has no administration in time range)  finasteride (PROSCAR) tablet 5 mg (has no administration in time range)  albuterol (VENTOLIN HFA) 108 (90 Base) MCG/ACT inhaler 2 puff (has no administration in time range)  enoxaparin (LOVENOX) injection 40 mg (has no administration in time range)  cefTRIAXone (ROCEPHIN) 2 g in sodium chloride 0.9 % 100 mL IVPB (has no administration in time range)  acetaminophen (TYLENOL) tablet 650 mg (has no administration in time range)    Or  acetaminophen (TYLENOL) suppository 650 mg (has no administration in time range)  ondansetron (ZOFRAN) tablet 4 mg (has no administration in time range)    Or  ondansetron (ZOFRAN) injection 4 mg (has no administration in time range)  cefTRIAXone (ROCEPHIN) 2 g in sodium chloride 0.9 % 100 mL IVPB (0 g Intravenous Stopped 04/10/21 2258)     ED Discharge Orders     None        Note:  This document was prepared using Dragon voice recognition software and may include unintentional dictation errors.   [name] Pollack, MD 04/10/21 [phone]

## 2021-04-10 NOTE — ED Triage Notes (Signed)
Patient has bilateral leg swelling with redness noted with worse to right leg. Patient states redness, swelling and pain started 2 days ago. Denies any injury. Warm to touch.

## 2021-04-10 NOTE — ED Triage Notes (Signed)
[name] via [facility]  with complaints of bilat leg swelling. VS WNL

## 2021-04-10 NOTE — H&P (Signed)
History and Physical   [name] KYH:[id number] DOB: 1941/01/22 DOA: 04/10/2021  PCP: Angela , MD  [facility] coming from: [name]  I have personally briefly reviewed patient's old medical records in [facility] EMR.  Chief Concern: Right leg pain  HPI: [name] is a 80 y.o. male with medical history significant for hypertension, anxiety, depression, CAD, hyperlipidemia, BPH, presents to the emergency department from [facility] for chief concerns of right lower extremity pain and swelling.  At bedside, he was able to tell me his name, he states his age of 80, location of [place], and the year is 2022.  When I stated that his age was 80, he said, "well this is news to me."  He reports his right lower leg started swelling up and hurting two days ago. He denies fever, chest pain, abdominal pain, dysuria, hematuria, diarrhea. He just had a bowel movement at home.  He denies trauma to his leg.  Denies any known bug bites.  At baseline, he is in a wheelchair bound.  He endorses shortness of breath when he sees something exciting things on TV.  Social history: He lives in assisted living. He is retired and was formerly in the [employer]. He was an orderly. He was never tobacco user, etoh user, drugs.   Vaccination history: He is vaccinated for covid 19. He does not know the number of doses.   ROS: Constitutional: no weight change, no fever ENT/Mouth: no sore throat, no rhinorrhea Eyes: no eye pain, no vision changes Cardiovascular: no chest pain, no dyspnea,  no edema, no palpitations Respiratory: no cough, no sputum, no wheezing Gastrointestinal: no nausea, no vomiting, no diarrhea, no constipation Genitourinary: no urinary incontinence, no dysuria, no hematuria Musculoskeletal: no arthralgias, no myalgias Skin: + skin lesions, no pruritus, Neuro: no weakness, no loss of consciousness, no syncope Psych: no anxiety, no  depression, no decrease appetite Heme/Lymph: no bruising, no bleeding  ED Course: Discussed with emergency medicine provider, patient requiring hospitalization for right lower extremity cellulitis and inability to ambulate.  Vitals in the emergency department was remarkable for temperature 98.7, respiration rate of 18, heart rate of 73, blood pressure 134/83, SPO2 of 98% on room air.  Labs in the emergency department was remarkable for sodium 140, potassium 3.7, chloride 109, bicarb 27, BUN 13, serum creatinine of 0.92, nonfasting blood glucose 89, WBC 4.7, chloride 9.4, platelets 203.  GFR 60.  In the emergency department patient was given ceftriaxone 2 g IV once.  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 twice daily, diltiazem 5 mg IV every 2 hours as needed for SBP greater than 175, 3 doses ordered  Hyperlipidemia-pravastatin 40 mg daily  GERD-pantoprazole daily  BPH-finasteride 5 mg daily, tamsulosin 0.4 mg p.o. daily  History of asthma-albuterol 2 puffs inhalation every 6 hours as needed for shortness of breath  COVID/influenza A/influenza B PCR pending  Pending med reconciliation  Chart reviewed.   DVT prophylaxis: Enoxaparin 40 mg subcutaneous nightly Code Status: Full code Diet: Heart healthy Family Communication: No Disposition Plan: Pending  clinical course Consults called: None at this time Admission status: MedSurg, obs, no telemetry indicated  Past Medical History:  Diagnosis Date   Bipolar 1 disorder (HCC)    C. difficile diarrhea 05/2016   history of  ...   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   Schizophrenia (HCC)    Seizure (HCC)    on phenobarb   Urinary retention    frequently requiring foley placement   VRE (vancomycin resistant enterococcus) culture positive    urine   Past Surgical History:  Procedure Laterality Date   EXTRACORPOREAL SHOCK WAVE LITHOTRIPSY Right 05/05/2017   Procedure: EXTRACORPOREAL SHOCK WAVE LITHOTRIPSY (ESWL);  Surgeon: Vanna Scotland, MD;  Location: [facility] ORS;  Service: Urology;  Laterality: Right;   IR CATHETER TUBE CHANGE  06/23/2017   IR CATHETER TUBE CHANGE  06/28/2017   Social History:  reports that he has never smoked. He has never used smokeless tobacco. He reports that he does not drink alcohol and does not use drugs.  Allergies  Allergen Reactions   Penicillin G Rash and Other (See Comments)    Convulsions.    Has patient had a PCN reaction causing immediate rash, facial/tongue/throat swelling, SOB or lightheadedness with hypotension: not sure Has patient had a PCN reaction causing severe rash involving mucus membranes or skin necrosis: not sure Has patient had a PCN reaction that required hospitalization: not sure Has patient had a PCN reaction occurring within the last 10 years: not sure If all of the above answers are "NO", then may proceed with Cephalosporin use.   Family History  Problem Relation Age of Onset   Cancer Mother    Prostate cancer Neg Hx    Kidney disease Neg Hx    Kidney cancer Neg Hx    Bladder Cancer Neg Hx    Family history: Family history reviewed and not pertinent  Prior to Admission medications   Medication Sig Start  Date End Date Taking? Authorizing Provider  acetaminophen (TYLENOL) 325 MG tablet Take 2 tablets (650 mg total) by mouth every 6 (six) hours as needed. 04/26/16   Alford Highland, MD  albuterol (VENTOLIN HFA) 108 (90 Base) MCG/ACT inhaler Inhale 2 puffs into the lungs every 6 (six) hours as needed for shortness of breath.    [provider]  amLODipine (NORVASC) 2.5 MG tablet Take 1 tablet (2.5 mg total) by mouth daily. 04/26/16   Alford Highland, MD  ARIPiprazole (ABILIFY) 20 MG tablet Take 1 tablet by mouth daily.    [provider]  buPROPion (BUDEPRION XL) 300 MG 24 hr tablet Take 1 tablet (300 mg total) by mouth daily. 04/26/16   Alford Highland, MD  carvedilol (COREG) 25 MG tablet Take 1 tablet (25 mg total) by mouth 2 (two) times daily. 04/26/16   Alford Highland, MD  Cranberry 300 MG tablet Take 1 tablet (300 mg total) by mouth 2 (two) times daily. 01/23/18   Michiel Cowboy A, PA-C  feeding supplement, ENSURE ENLIVE, (ENSURE ENLIVE) LIQD Take 237 mLs by mouth 2 (two) times daily between meals. Patient not taking: Reported on 07/26/2018  12/25/16   Enid Baas, MD  finasteride (PROSCAR) 5 MG tablet Take 1 tablet (5 mg total) by mouth daily. 04/27/16   Alford Highland, MD  furosemide (LASIX) 20 MG tablet Take 20 mg by mouth.    [provider]  Multiple Vitamin (MULTI-VITAMINS) TABS Take by mouth.    [provider]  omeprazole (PRILOSEC) 20 MG capsule Take 2 capsules (40 mg total) by mouth daily. Patient taking differently: Take 40 mg by mouth at bedtime.  04/26/16   Alford Highland, MD  PHENobarbital (LUMINAL) 64.8 MG tablet Take 2 tablets (129.6 mg total) by mouth every other day. 04/26/16   Alford Highland, MD  potassium chloride SA (K-DUR,KLOR-CON) 20 MEQ tablet Take 40 mEq by mouth 2 (two) times daily.     [provider]  pravastatin (PRAVACHOL) 40 MG tablet Take 1 tablet (40 mg total) by mouth daily. 04/26/16   Alford Highland, MD   tamsulosin (FLOMAX) 0.4 MG CAPS capsule Take 1 capsule (0.4 mg total) by mouth daily. 06/16/16   Harle Battiest, PA-C   Physical Exam: Vitals:   04/10/21 1717 04/10/21 1928 04/10/21 2124 04/10/21 2130  BP:  (!) 154/74 (!) 150/82 (!) 149/73  Pulse:  72 76 78  Resp:  20 18   Temp:      TempSrc:      SpO2:  100% 98% 98%  Weight: 112 kg     Height: 5\' 10"  (1.778 m)      Constitutional: appears age-appropriate, generalized paleness and weakness, NAD, calm, comfortable Eyes: PERRL, lids and conjunctivae normal ENMT: Mucous membranes are moist. Posterior pharynx clear of any exudate or lesions. Age-appropriate dentition.  Mild to moderate hearing loss Neck: normal, supple, no masses, no thyromegaly Respiratory: clear to auscultation bilaterally, no wheezing, no crackles. Normal respiratory effort. No accessory muscle use.  Cardiovascular: Regular rate and rhythm, no murmurs / rubs / gallops. No extremity edema. 2+ pedal pulses. No carotid bruits.  Abdomen: no tenderness, no masses palpated, no hepatosplenomegaly. Bowel sounds positive. Supra pubic catheter present  Musculoskeletal: no clubbing / cyanosis. No joint deformity upper and lower extremities. Good ROM, no contractures, no atrophy. Normal muscle tone.  Skin: + rashes, lesions, ulcers. No induration.  Worsening erythema and edema present on the right lower extremity.  Negative for purulence    Neurologic: Sensation intact. Strength 5/5 in all 4.  Psychiatric: Normal judgment and insight. Alert and oriented x 3. Normal mood.   EKG: Not indicated  Chest x-ray on Admission: Not indicated  Venous Img Lower Bilateral  Result Date: 04/10/2021 CLINICAL DATA:  Bilateral lower extremity swelling. Skin color changes. EXAM: BILATERAL LOWER EXTREMITY VENOUS DOPPLER ULTRASOUND TECHNIQUE: Gray-scale sonography with compression, as well as color and duplex ultrasound, were performed to evaluate the deep venous system(s) from the level of  the common femoral vein through the popliteal and proximal calf veins. COMPARISON:  Prior study 05/24/2016 FINDINGS: VENOUS Normal compressibility of the common femoral, superficial femoral, and popliteal veins, as well as the visualized calf veins. The calf veins are poorly visualized bilaterally due to edema. Visualized portions of profunda femoral vein and great saphenous vein unremarkable. No filling defects to suggest DVT on grayscale or color Doppler imaging. Doppler waveforms show normal direction of venous flow, normal respiratory plasticity and response to augmentation. OTHER None. Limitations: Edema within the lower legs limits evaluation of the calf veins. IMPRESSION: No evidence of recurrent DVT within either leg. The calf veins are suboptimally visualized. Electronically Signed  By: Carey BullocksWilliam  Veazey M.D.   On: 04/10/2021 21:21    Labs on Admission: I have personally reviewed following labs  CBC: Recent Labs  Lab 04/10/21 1957  WBC 4.7  NEUTROABS 3.0  HGB 9.4*  HCT 27.9*  MCV 89.7  PLT 203   Basic Metabolic Panel: Recent Labs  Lab 04/10/21 1957  NA 140  K 3.7  CL 109  CO2 27  GLUCOSE 89  BUN 13  CREATININE 0.92  CALCIUM 7.9*   GFR: Estimated Creatinine Clearance: 82.9 mL/min (by C-G formula based on SCr of 0.92 mg/dL).  Liver Function Tests: Recent Labs  Lab 04/10/21 1957  AST 11*  ALT 8  ALKPHOS 77  BILITOT 0.6  PROT 5.8*  ALBUMIN 2.5*   Urine analysis:    Component Value Date/Time   COLORURINE YELLOW (A) 04/10/2021 2138   APPEARANCEUR CLOUDY (A) 04/10/2021 2138   APPEARANCEUR Hazy 10/02/2012 0915   LABSPEC 1.020 04/10/2021 2138   LABSPEC 1.027 10/02/2012 0915   PHURINE 7.5 04/10/2021 2138   GLUCOSEU NEGATIVE 04/10/2021 2138   GLUCOSEU Negative 10/02/2012 0915   HGBUR LARGE (A) 04/10/2021 2138   BILIRUBINUR NEGATIVE 04/10/2021 2138   BILIRUBINUR Negative 10/02/2012 0915   KETONESUR NEGATIVE 04/10/2021 2138   PROTEINUR 100 (A) 04/10/2021 2138    NITRITE POSITIVE (A) 04/10/2021 2138   LEUKOCYTESUR MODERATE (A) 04/10/2021 2138   LEUKOCYTESUR 1+ 10/02/2012 0915   Dr. Sedalia Mutaox [facility]  If 7PM-7AM, please contact overnight-coverage provider If 7AM-7PM, please contact day coverage provider [email or web address]  04/10/2021, 10:36 PM

## 2021-04-10 NOTE — ED Triage Notes (Signed)
Pt alert and NAD.  VSS.  Waiting on room.  Lab called to draw labs.

## 2021-04-11 ENCOUNTER — Encounter: Payer: Self-pay | Admitting: Internal Medicine

## 2021-04-11 DIAGNOSIS — L03115 Cellulitis of right lower limb: Secondary | ICD-10-CM | POA: Diagnosis not present

## 2021-04-11 LAB — BASIC METABOLIC PANEL
Anion gap: 5 (ref 5–15)
BUN: 11 mg/dL (ref 8–23)
CO2: 26 mmol/L (ref 22–32)
Calcium: 8.3 mg/dL — ABNORMAL LOW (ref 8.9–10.3)
Chloride: 110 mmol/L (ref 98–111)
Creatinine, Ser: 0.95 mg/dL (ref 0.61–1.24)
GFR, Estimated: >60 mL/min (ref >60)
Glucose, Bld: 89 mg/dL (ref 70–99)
Potassium: 3.6 mmol/L (ref 3.5–5.1)
Sodium: 141 mmol/L (ref 135–145)

## 2021-04-11 LAB — CBC
HCT: 30.8 % — ABNORMAL LOW (ref 39.0–52.0)
Hemoglobin: 10.1 g/dL — ABNORMAL LOW (ref 13.0–17.0)
MCH: 29.6 pg (ref 26.0–34.0)
MCHC: 32.8 g/dL (ref 30.0–36.0)
MCV: 90.3 fL (ref 80.0–100.0)
Platelets: 224 10*3/uL (ref 150–400)
RBC: 3.41 MIL/uL — ABNORMAL LOW (ref 4.22–5.81)
RDW: 14.7 % (ref 11.5–15.5)
WBC: 6 10*3/uL (ref 4.0–10.5)
nRBC: 0 % (ref 0.0–0.2)

## 2021-04-11 LAB — SARS CORONAVIRUS 2 (TAT 6-24 HRS): SARS Coronavirus 2: NEGATIVE

## 2021-04-11 LAB — MRSA NEXT GEN BY PCR, NASAL: MRSA by PCR Next Gen: NOT DETECTED

## 2021-04-11 LAB — LACTIC ACID, PLASMA: Lactic Acid, Venous: 1 mmol/L (ref 0.5–1.9)

## 2021-04-11 MED ORDER — CEPHALEXIN 500 MG PO CAPS: 500.0000 mg | ORAL_CAPSULE | Freq: Four times a day (QID) | ORAL | 0 refills | Status: AC

## 2021-04-11 MED ORDER — OXYCODONE HCL 5 MG PO TABS: 5.0000 mg | ORAL_TABLET | Freq: Three times a day (TID) | ORAL | 0 refills | Status: DC | PRN

## 2021-04-11 NOTE — Plan of Care (Signed)
Patient admitted to unit. IVP morphine administered for 10/10 pain in right leg. Food and water provided to patient. Sleeping comfortably at this time.  Problem: Health Behavior/Discharge Planning: Goal: Ability to manage health-related needs will improve Outcome: Progressing   Problem: Clinical Measurements: Goal: Ability to maintain clinical measurements within normal limits will improve Outcome: Progressing Goal: Will remain free from infection Outcome: Progressing Goal: Diagnostic test results will improve Outcome: Progressing Goal: Respiratory complications will improve Outcome: Progressing Goal: Cardiovascular complication will be avoided Outcome: Progressing   Problem: Activity: Goal: Risk for activity intolerance will decrease Outcome: Progressing   Problem: Pain Managment: Goal: General experience of comfort will improve Outcome: Progressing

## 2021-04-11 NOTE — TOC Transition Note (Signed)
Transition of Care [facility]) - CM/SW Discharge Note   Patient Details  Name: [name] MRN: [id number] Date of Birth: May 16, 1941  [facility] CM/SW Contact:  Luvenia Redden, RN Phone Number: 04/11/2021, 12:04 PM   Clinical Narrative:    [facility] RN spoke with Ardyth Gal at [facility] for returning pt to the facility. Attending has sent all medications requested to [facility] in [place]. [facility] called for transportation. Medical necessity and facesheet available for EMS upon arrival for the requested transportation. No additional request at this time.  TOC remains available if additional needs arise.         Patient Goals and CMS Choice        Discharge Placement                       Discharge Plan and Services                                     Social Determinants of Health (SDOH) Interventions     Readmission Risk Interventions No flowsheet data found.

## 2021-04-11 NOTE — Progress Notes (Signed)
Anticoagulation monitoring(Lovenox):  80 yo male ordered Lovenox 40 mg Q24h    Filed Weights   04/10/21 1717  Weight: 112 kg (247 lb)   BMI 35.44    Lab Results  Component Value Date   CREATININE 0.92 04/10/2021   CREATININE 1.19 12/25/2016   CREATININE 1.36 (H) 12/24/2016   Estimated Creatinine Clearance: 82.9 mL/min (by C-G formula based on SCr of 0.92 mg/dL). Hemoglobin & Hematocrit     Component Value Date/Time   HGB 9.4 (L) 04/10/2021 1957   HGB 11.4 (L) 10/18/2016 0925   HCT 27.9 (L) 04/10/2021 1957   HCT 34.7 (L) 10/18/2016 0768     Per Protocol for Patient with estCrcl > 30 ml/min and BMI > 30, will transition to Lovenox 55 mg Q24h.

## 2021-04-11 NOTE — Discharge Summary (Signed)
Physician Discharge Summary  [name] GYF:[id number] DOB: 02-05-1941 DOA: 04/10/2021  PCP: Angela Cox, MD  Admit date: 04/10/2021 Discharge date: 04/11/2021  Admitted From: ALF  Disposition:  ALF   Recommendations for Outpatient Follow-up:  Follow up with PCP Dr. Kerry Dory in 1 week      Home Health: None  Equipment/Devices: None new  Discharge Condition: Good  CODE STATUS: FULL Diet recommendation: Cardiac  Brief/Interim Summary: [name] is a 80 y.o. M with HTN, CAD, BPH, wheelchair bound who presents with 2 days RLE swelling, redness and pain.  In the ER, appeared to have cellulitis.  [place] negative for DVT.  Started on Rocephin.     PRINCIPAL HOSPITAL DIAGNOSIS: Cellulitis    Discharge Diagnoses:   Right lower extremity cellulitis Patient given a dose of Rocephin in the ER, observed overnight.  Ultrasound ruled out DVT.  He had no fever, tachycardia, confusion.  Sepsis ruled out.  He was discharged to complete 7 days with Keflex.     Hypertension Coronary artery disease, secondary prevention Blood pressure controlled   GERD   BPH   History of asthma No flare               Discharge Instructions  Discharge Instructions     Diet - low sodium heart healthy   Complete by: As directed    Discharge instructions   Complete by: As directed    From Dr. Maryfrances Bunnell: You were evaluated for right leg pain.  This appears to be a cellulitis. You should take an antibiotic:  Take cephalexin 500 mg four times daily for the next week Go see your primary care doctor in 1 week Elevate the leg For pain, take oxycodone 5 mg up to every 8 hours  Return for fever, chills, vomiting, or confusion  After that, you should continue your Lasix, elevate the legs often, and apply compression stockings to reduce your chronic swelling   Increase activity slowly   Complete by: As directed    No wound care   Complete by: As directed        Allergies as of 04/11/2021       Reactions   Penicillin G Rash, Other (See Comments)   Convulsions.    Has patient had a PCN reaction causing immediate rash, facial/tongue/throat swelling, SOB or lightheadedness with hypotension: not sure Has patient had a PCN reaction causing severe rash involving mucus membranes or skin necrosis: not sure Has patient had a PCN reaction that required hospitalization: not sure Has patient had a PCN reaction occurring within the last 10 years: not sure If all of the above answers are "NO", then may proceed with Cephalosporin use.        Medication List     TAKE these medications    acetaminophen 325 MG tablet Commonly known as: TYLENOL Take 2 tablets (650 mg total) by mouth every 6 (six) hours as needed.   amLODipine 2.5 MG tablet Commonly known as: NORVASC Take 1 tablet (2.5 mg total) by mouth daily.   ARIPiprazole 20 MG tablet Commonly known as: ABILIFY Take 1 tablet by mouth daily.   buPROPion 300 MG 24 hr tablet Commonly known as: Budeprion XL Take 1 tablet (300 mg total) by mouth daily.   carvedilol 25 MG tablet Commonly known as: COREG Take 1 tablet (25 mg total) by mouth 2 (two) times daily.   cephALEXin 500 MG capsule Commonly known as: KEFLEX Take 1 capsule (500 mg total) by mouth 4 (  four) times daily for 7 days.   Cranberry 300 MG tablet Take 1 tablet (300 mg total) by mouth 2 (two) times daily.   feeding supplement Liqd Take 237 mLs by mouth 2 (two) times daily between meals.   finasteride 5 MG tablet Commonly known as: PROSCAR Take 1 tablet (5 mg total) by mouth daily.   furosemide 20 MG tablet Commonly known as: LASIX Take 20 mg by mouth.   Multi-Vitamins Tabs Take by mouth.   omeprazole 20 MG capsule Commonly known as: PRILOSEC Take 2 capsules (40 mg total) by mouth daily. What changed: when to take this   oxyCODONE 5 MG immediate release tablet Commonly known as: Roxicodone Take 1 tablet (5 mg total)  by mouth every 8 (eight) hours as needed.   PHENobarbital 64.8 MG tablet Commonly known as: LUMINAL Take 2 tablets (129.6 mg total) by mouth every other day.   potassium chloride SA 20 MEQ tablet Commonly known as: KLOR-CON Take 40 mEq by mouth 2 (two) times daily.   pravastatin 40 MG tablet Commonly known as: PRAVACHOL Take 1 tablet (40 mg total) by mouth daily.   tamsulosin 0.4 MG Caps capsule Commonly known as: FLOMAX Take 1 capsule (0.4 mg total) by mouth daily.   Ventolin HFA 108 (90 Base) MCG/ACT inhaler Generic drug: albuterol Inhale 2 puffs into the lungs every 6 (six) hours as needed for shortness of breath.        Follow-up Information     Angela Cox, MD. Schedule an appointment as soon as possible for a visit in 1 week(s).   Specialty: Internal Medicine Why: Ofice closed patient to make own follow up appt Contact information: [address] [place] [address] [phone]                Allergies  Allergen Reactions   Penicillin G Rash and Other (See Comments)    Convulsions.    Has patient had a PCN reaction causing immediate rash, facial/tongue/throat swelling, SOB or lightheadedness with hypotension: not sure Has patient had a PCN reaction causing severe rash involving mucus membranes or skin necrosis: not sure Has patient had a PCN reaction that required hospitalization: not sure Has patient had a PCN reaction occurring within the last 10 years: not sure If all of the above answers are "NO", then may proceed with Cephalosporin use.      Procedures/Studies: US Venous Img Lower Bilateral  Result Date: 04/10/2021 CLINICAL DATA:  Bilateral lower extremity swelling. Skin color changes. EXAM: BILATERAL LOWER EXTREMITY VENOUS DOPPLER ULTRASOUND TECHNIQUE: Gray-scale sonography with compression, as well as color and duplex ultrasound, were performed to evaluate the deep venous system(s) from the level of the common femoral vein  through the popliteal and proximal calf veins. COMPARISON:  Prior study 05/24/2016 FINDINGS: VENOUS Normal compressibility of the common femoral, superficial femoral, and popliteal veins, as well as the visualized calf veins. The calf veins are poorly visualized bilaterally due to edema. Visualized portions of profunda femoral vein and great saphenous vein unremarkable. No filling defects to suggest DVT on grayscale or color Doppler imaging. Doppler waveforms show normal direction of venous flow, normal respiratory plasticity and response to augmentation. OTHER None. Limitations: Edema within the lower legs limits evaluation of the calf veins. IMPRESSION: No evidence of recurrent DVT within either leg. The calf veins are suboptimally visualized. Electronically Signed   By: Carey Bullocks M.D.   On: 04/10/2021 21:21      Subjective: Patient feels  well.  No confusion, headache, chest pain, dyspnea, vomiting.  His right leg is still somewhat painful but not too bad.  Discharge Exam: Vitals:   04/11/21 0646 04/11/21 0755  BP: 138/62 (!) 147/70  Pulse: 79 79  Resp: 16 16  Temp: 98.8 F (37.1 C) 98.5 F (36.9 C)  SpO2: 98% 99%   Vitals:   04/11/21 0200 04/11/21 0242 04/11/21 0646 04/11/21 0755  BP: (!) 154/81 (!) 165/90 138/62 (!) 147/70  Pulse: 83 79 79 79  Resp: 18 16 16 16   Temp:  98.8 F (37.1 C) 98.8 F (37.1 C) 98.5 F (36.9 C)  TempSrc:  Oral  Oral  SpO2: 97% 100% 98% 99%  Weight:  116.5 kg    Height:  5\' 10"  (1.778 m)      General: Pt is alert, awake, not in acute distress Cardiovascular: RRR, nl S1-S2, no murmurs appreciated.   No LE edema.   Respiratory: Normal respiratory rate and rhythm.  CTAB without rales or wheezes. Skin: Chronic venous insufficiency changes to the bilateral lower extremities, tenderness and increased edema on the right with pain no purulence, no fluid collections. Abdominal: Abdomen soft and non-tender.  No distension or HSM.   Neuro/Psych: Strength  symmetric in upper and lower extremities.  Judgment and insight appear mildly impaired.   The results of significant diagnostics from this hospitalization (including imaging, microbiology, ancillary and laboratory) are listed below for reference.     Microbiology: Recent Results (from the past 240 hour(s))  Blood culture (routine x 2)     Status: None (Preliminary result)   Collection Time: 04/10/21  9:37 PM   Specimen: BLOOD  Result Value Ref Range Status   Specimen Description BLOOD RIGHT ANTECUBITAL  Final   Special Requests   Final    BOTTLES DRAWN AEROBIC AND ANAEROBIC Blood Culture adequate volume   Culture   Final    NO GROWTH < 12 HOURS Performed at [facility], [address] [place], [place] [address]    Report Status PENDING  Incomplete  Blood culture (routine x 2)     Status: None (Preliminary result)   Collection Time: 04/10/21  9:37 PM   Specimen: BLOOD  Result Value Ref Range Status   Specimen Description BLOOD BLOOD RIGHT FOREARM  Final   Special Requests   Final    BOTTLES DRAWN AEROBIC AND ANAEROBIC Blood Culture adequate volume   Culture   Final    NO GROWTH < 12 HOURS Performed at [facility], [address] [place], [place] [address]    Report Status PENDING  Incomplete  MRSA Next Gen by PCR, Nasal     Status: None   Collection Time: 04/11/21  4:32 AM   Specimen: Nasal Mucosa; Nasal Swab  Result Value Ref Range Status   MRSA by PCR Next Gen NOT DETECTED NOT DETECTED Final    Comment: (NOTE) The GeneXpert MRSA Assay (FDA approved for NASAL specimens only), is one component of a comprehensive MRSA colonization surveillance program. It is not intended to diagnose MRSA infection nor to guide or monitor treatment for MRSA infections. Test performance is not FDA approved in patients less than 78 years old. Performed at [facility], [address] [place], [place] [address]      Labs: BNP (last 3 results) Recent Labs     04/10/21 1957  BNP 421.3*   Basic Metabolic Panel: Recent Labs  Lab 04/10/21 1957 04/11/21 0251  NA 140 141  K  3.7 3.6  CL 109 110  CO2 27 26  GLUCOSE 89 89  BUN 13 11  CREATININE 0.92 0.95  CALCIUM 7.9* 8.3*   Liver Function Tests: Recent Labs  Lab 04/10/21 1957  AST 11*  ALT 8  ALKPHOS 77  BILITOT 0.6  PROT 5.8*  ALBUMIN 2.5*   No results for input(s): LIPASE, AMYLASE in the last 168 hours. No results for input(s): AMMONIA in the last 168 hours. CBC: Recent Labs  Lab 04/10/21 1957 04/11/21 0251  WBC 4.7 6.0  NEUTROABS 3.0  --   HGB 9.4* 10.1*  HCT 27.9* 30.8*  MCV 89.7 90.3  PLT 203 224   Cardiac Enzymes: No results for input(s): CKTOTAL, CKMB, CKMBINDEX, TROPONINI in the last 168 hours. BNP: Invalid input(s): POCBNP CBG: No results for input(s): GLUCAP in the last 168 hours. D-Dimer No results for input(s): DDIMER in the last 72 hours. Hgb A1c No results for input(s): HGBA1C in the last 72 hours. Lipid Profile No results for input(s): CHOL, HDL, LDLCALC, TRIG, CHOLHDL, LDLDIRECT in the last 72 hours. Thyroid function studies No results for input(s): TSH, T4TOTAL, T3FREE, THYROIDAB in the last 72 hours.  Invalid input(s): FREET3 Anemia work up No results for input(s): VITAMINB12, FOLATE, FERRITIN, TIBC, IRON, RETICCTPCT in the last 72 hours. Urinalysis    Component Value Date/Time   COLORURINE YELLOW (A) 04/10/2021 2138   APPEARANCEUR CLOUDY (A) 04/10/2021 2138   APPEARANCEUR Hazy 10/02/2012 0915   LABSPEC 1.020 04/10/2021 2138   LABSPEC 1.027 10/02/2012 0915   PHURINE 7.5 04/10/2021 2138   GLUCOSEU NEGATIVE 04/10/2021 2138   GLUCOSEU Negative 10/02/2012 0915   HGBUR LARGE (A) 04/10/2021 2138   BILIRUBINUR NEGATIVE 04/10/2021 2138   BILIRUBINUR Negative 10/02/2012 0915   KETONESUR NEGATIVE 04/10/2021 2138   PROTEINUR 100 (A) 04/10/2021 2138   NITRITE POSITIVE (A) 04/10/2021 2138   LEUKOCYTESUR MODERATE (A) 04/10/2021 2138    LEUKOCYTESUR 1+ 10/02/2012 0915   Sepsis Labs Invalid input(s): PROCALCITONIN,  WBC,  LACTICIDVEN Microbiology Recent Results (from the past 240 hour(s))  Blood culture (routine x 2)     Status: None (Preliminary result)   Collection Time: 04/10/21  9:37 PM   Specimen: BLOOD  Result Value Ref Range Status   Specimen Description BLOOD RIGHT ANTECUBITAL  Final   Special Requests   Final    BOTTLES DRAWN AEROBIC AND ANAEROBIC Blood Culture adequate volume   Culture   Final    NO GROWTH < 12 HOURS Performed at [facility], [address] [place], [place] [address]    Report Status PENDING  Incomplete  Blood culture (routine x 2)     Status: None (Preliminary result)   Collection Time: 04/10/21  9:37 PM   Specimen: BLOOD  Result Value Ref Range Status   Specimen Description BLOOD BLOOD RIGHT FOREARM  Final   Special Requests   Final    BOTTLES DRAWN AEROBIC AND ANAEROBIC Blood Culture adequate volume   Culture   Final    NO GROWTH < 12 HOURS Performed at [facility], [address] [place], [place] [address]    Report Status PENDING  Incomplete  MRSA Next Gen by PCR, Nasal     Status: None   Collection Time: 04/11/21  4:32 AM   Specimen: Nasal Mucosa; Nasal Swab  Result Value Ref Range Status   MRSA by PCR Next Gen NOT DETECTED NOT DETECTED Final    Comment: (NOTE) The GeneXpert MRSA Assay (FDA approved for NASAL  specimens only), is one component of a comprehensive MRSA colonization surveillance program. It is not intended to diagnose MRSA infection nor to guide or monitor treatment for MRSA infections. Test performance is not FDA approved in patients less than 83 years old. Performed at [facility], [address] [place], [place] [address]      Time coordinating discharge: 30 minutes The [place] controlled substances registry was reviewed for this patient prior to filling the <5 days supply controlled substances  script.         SIGNED:   Alberteen Sam, MD  [facility] 04/11/2021, 8:41 AM

## 2021-04-12 LAB — URINE CULTURE

## 2021-04-15 LAB — CULTURE, BLOOD (ROUTINE X 2)
Culture: NO GROWTH
Culture: NO GROWTH
Special Requests: ADEQUATE
Special Requests: ADEQUATE

## 2022-01-10 ENCOUNTER — Emergency Department: Payer: Medicare Other

## 2022-01-10 ENCOUNTER — Inpatient Hospital Stay
Admission: EM | Admit: 2022-01-10 | Discharge: 2022-01-16 | DRG: 871 | Disposition: A | Discharge status: Home Health | Payer: Medicare Other | Source: Skilled Nursing Facility | Attending: Internal Medicine | Admitting: Internal Medicine

## 2022-01-10 ENCOUNTER — Inpatient Hospital Stay: Payer: Medicare Other

## 2022-01-10 ENCOUNTER — Other Ambulatory Visit: Payer: Self-pay

## 2022-01-10 DIAGNOSIS — Z87442 Personal history of urinary calculi: Secondary | ICD-10-CM

## 2022-01-10 DIAGNOSIS — N400 Enlarged prostate without lower urinary tract symptoms: Secondary | ICD-10-CM | POA: Diagnosis present

## 2022-01-10 DIAGNOSIS — G9341 Metabolic encephalopathy: Secondary | ICD-10-CM | POA: Diagnosis present

## 2022-01-10 DIAGNOSIS — L03116 Cellulitis of left lower limb: Secondary | ICD-10-CM | POA: Diagnosis present

## 2022-01-10 DIAGNOSIS — I872 Venous insufficiency (chronic) (peripheral): Secondary | ICD-10-CM | POA: Diagnosis present

## 2022-01-10 DIAGNOSIS — L89619 Pressure ulcer of right heel, unspecified stage: Secondary | ICD-10-CM | POA: Diagnosis present

## 2022-01-10 DIAGNOSIS — F319 Bipolar disorder, unspecified: Secondary | ICD-10-CM | POA: Diagnosis present

## 2022-01-10 DIAGNOSIS — D638 Anemia in other chronic diseases classified elsewhere: Secondary | ICD-10-CM | POA: Diagnosis present

## 2022-01-10 DIAGNOSIS — R1312 Dysphagia, oropharyngeal phase: Secondary | ICD-10-CM | POA: Diagnosis not present

## 2022-01-10 DIAGNOSIS — E782 Mixed hyperlipidemia: Secondary | ICD-10-CM | POA: Diagnosis present

## 2022-01-10 DIAGNOSIS — R1310 Dysphagia, unspecified: Secondary | ICD-10-CM

## 2022-01-10 DIAGNOSIS — F32A Depression, unspecified: Secondary | ICD-10-CM | POA: Diagnosis present

## 2022-01-10 DIAGNOSIS — N1831 Chronic kidney disease, stage 3a: Secondary | ICD-10-CM | POA: Diagnosis present

## 2022-01-10 DIAGNOSIS — E876 Hypokalemia: Secondary | ICD-10-CM | POA: Diagnosis present

## 2022-01-10 DIAGNOSIS — I10 Essential (primary) hypertension: Secondary | ICD-10-CM | POA: Diagnosis present

## 2022-01-10 DIAGNOSIS — I130 Hypertensive heart and chronic kidney disease with heart failure and stage 1 through stage 4 chronic kidney disease, or unspecified chronic kidney disease: Secondary | ICD-10-CM | POA: Diagnosis present

## 2022-01-10 DIAGNOSIS — G40909 Epilepsy, unspecified, not intractable, without status epilepticus: Secondary | ICD-10-CM | POA: Diagnosis present

## 2022-01-10 DIAGNOSIS — Z880 Allergy status to penicillin: Secondary | ICD-10-CM | POA: Diagnosis not present

## 2022-01-10 DIAGNOSIS — F209 Schizophrenia, unspecified: Secondary | ICD-10-CM | POA: Diagnosis present

## 2022-01-10 DIAGNOSIS — N390 Urinary tract infection, site not specified: Secondary | ICD-10-CM | POA: Diagnosis present

## 2022-01-10 DIAGNOSIS — L89629 Pressure ulcer of left heel, unspecified stage: Secondary | ICD-10-CM | POA: Diagnosis present

## 2022-01-10 DIAGNOSIS — I5032 Chronic diastolic (congestive) heart failure: Secondary | ICD-10-CM | POA: Diagnosis present

## 2022-01-10 DIAGNOSIS — R339 Retention of urine, unspecified: Secondary | ICD-10-CM | POA: Diagnosis present

## 2022-01-10 DIAGNOSIS — A419 Sepsis, unspecified organism: Principal | ICD-10-CM | POA: Diagnosis present

## 2022-01-10 DIAGNOSIS — D649 Anemia, unspecified: Secondary | ICD-10-CM | POA: Diagnosis present

## 2022-01-10 DIAGNOSIS — Z20822 Contact with and (suspected) exposure to covid-19: Secondary | ICD-10-CM | POA: Diagnosis present

## 2022-01-10 DIAGNOSIS — J449 Chronic obstructive pulmonary disease, unspecified: Secondary | ICD-10-CM | POA: Diagnosis present

## 2022-01-10 DIAGNOSIS — K219 Gastro-esophageal reflux disease without esophagitis: Secondary | ICD-10-CM | POA: Diagnosis present

## 2022-01-10 DIAGNOSIS — R1311 Dysphagia, oral phase: Secondary | ICD-10-CM | POA: Diagnosis not present

## 2022-01-10 DIAGNOSIS — R4182 Altered mental status, unspecified: Principal | ICD-10-CM

## 2022-01-10 LAB — RESP PANEL BY RT-PCR (FLU A&B, COVID) ARPGX2
Influenza A by PCR: NEGATIVE
Influenza B by PCR: NEGATIVE
SARS Coronavirus 2 by RT PCR: NEGATIVE

## 2022-01-10 LAB — COMPREHENSIVE METABOLIC PANEL
ALT: 39 U/L (ref 0–44)
AST: 88 U/L — ABNORMAL HIGH (ref 15–41)
Albumin: 2.1 g/dL — ABNORMAL LOW (ref 3.5–5.0)
Alkaline Phosphatase: 136 U/L — ABNORMAL HIGH (ref 38–126)
Anion gap: 4 — ABNORMAL LOW (ref 5–15)
BUN: 37 mg/dL — ABNORMAL HIGH (ref 8–23)
CO2: 25 mmol/L (ref 22–32)
Calcium: 7.7 mg/dL — ABNORMAL LOW (ref 8.9–10.3)
Chloride: 112 mmol/L — ABNORMAL HIGH (ref 98–111)
Creatinine, Ser: 1.51 mg/dL — ABNORMAL HIGH (ref 0.61–1.24)
GFR, Estimated: 47 mL/min — ABNORMAL LOW (ref >60)
Glucose, Bld: 121 mg/dL — ABNORMAL HIGH (ref 70–99)
Potassium: 2.2 mmol/L — CL (ref 3.5–5.1)
Sodium: 141 mmol/L (ref 135–145)
Total Bilirubin: 0.8 mg/dL (ref 0.3–1.2)
Total Protein: 6.6 g/dL (ref 6.5–8.1)

## 2022-01-10 LAB — LACTIC ACID, PLASMA
Lactic Acid, Venous: 0.9 mmol/L (ref 0.5–1.9)
Lactic Acid, Venous: 1.1 mmol/L (ref 0.5–1.9)

## 2022-01-10 LAB — CBC WITH DIFFERENTIAL/PLATELET
Abs Immature Granulocytes: 0.46 10*3/uL — ABNORMAL HIGH (ref 0.00–0.07)
Basophils Absolute: 0 10*3/uL (ref 0.0–0.1)
Basophils Relative: 0 %
Eosinophils Absolute: 0.1 10*3/uL (ref 0.0–0.5)
Eosinophils Relative: 1 %
HCT: 33.6 % — ABNORMAL LOW (ref 39.0–52.0)
Hemoglobin: 10.7 g/dL — ABNORMAL LOW (ref 13.0–17.0)
Immature Granulocytes: 3 %
Lymphocytes Relative: 7 %
Lymphs Abs: 1.3 10*3/uL (ref 0.7–4.0)
MCH: 27.2 pg (ref 26.0–34.0)
MCHC: 31.8 g/dL (ref 30.0–36.0)
MCV: 85.3 fL (ref 80.0–100.0)
Monocytes Absolute: 0.4 10*3/uL (ref 0.1–1.0)
Monocytes Relative: 2 %
Neutro Abs: 16.1 10*3/uL — ABNORMAL HIGH (ref 1.7–7.7)
Neutrophils Relative %: 87 %
Platelets: 304 10*3/uL (ref 150–400)
RBC: 3.94 MIL/uL — ABNORMAL LOW (ref 4.22–5.81)
RDW: 15 % (ref 11.5–15.5)
WBC: 18.5 10*3/uL — ABNORMAL HIGH (ref 4.0–10.5)
nRBC: 0 % (ref 0.0–0.2)

## 2022-01-10 LAB — MRSA NEXT GEN BY PCR, NASAL: MRSA by PCR Next Gen: NOT DETECTED

## 2022-01-10 LAB — URINALYSIS, COMPLETE (UACMP) WITH MICROSCOPIC
Bilirubin Urine: NEGATIVE
Glucose, UA: NEGATIVE mg/dL
Ketones, ur: NEGATIVE mg/dL
Nitrite: NEGATIVE
Protein, ur: 100 mg/dL — AB
Specific Gravity, Urine: 1.012 (ref 1.005–1.030)
Squamous Epithelial / HPF: NONE SEEN (ref 0–5)
WBC, UA: >50 WBC/hpf — ABNORMAL HIGH (ref 0–5)
pH: 8 (ref 5.0–8.0)

## 2022-01-10 LAB — BRAIN NATRIURETIC PEPTIDE: B Natriuretic Peptide: 530 pg/mL — ABNORMAL HIGH (ref 0.0–100.0)

## 2022-01-10 LAB — CK: Total CK: 88 U/L (ref 49–397)

## 2022-01-10 LAB — MAGNESIUM: Magnesium: 1.9 mg/dL (ref 1.7–2.4)

## 2022-01-10 LAB — C-REACTIVE PROTEIN: CRP: 19.4 mg/dL — ABNORMAL HIGH (ref <1.0)

## 2022-01-10 LAB — PROTIME-INR
INR: 1.3 — ABNORMAL HIGH (ref 0.8–1.2)
Prothrombin Time: 15.9 seconds — ABNORMAL HIGH (ref 11.4–15.2)

## 2022-01-10 LAB — APTT: aPTT: 33 seconds (ref 24–36)

## 2022-01-10 LAB — PROCALCITONIN: Procalcitonin: 1.09 ng/mL

## 2022-01-10 LAB — SEDIMENTATION RATE: Sed Rate: 111 mm/hr — ABNORMAL HIGH (ref 0–20)

## 2022-01-10 MED ORDER — PRAVASTATIN SODIUM 20 MG PO TABS
40.0000 mg | ORAL_TABLET | Freq: Every day | ORAL | Status: DC
  Administered 2022-01-10 – 2022-01-16 (×7): 40 mg via ORAL
  Filled 2022-01-10 (×7): qty 2

## 2022-01-10 MED ORDER — HYDRALAZINE HCL 20 MG/ML IJ SOLN: 5.0000 mg | INTRAMUSCULAR | Status: DC | PRN

## 2022-01-10 MED ORDER — ALBUTEROL SULFATE (2.5 MG/3ML) 0.083% IN NEBU: 2.5000 mg | INHALATION_SOLUTION | RESPIRATORY_TRACT | Status: DC | PRN

## 2022-01-10 MED ORDER — FINASTERIDE 5 MG PO TABS
5.0000 mg | ORAL_TABLET | Freq: Every day | ORAL | Status: DC
  Administered 2022-01-10 – 2022-01-16 (×7): 5 mg via ORAL
  Filled 2022-01-10 (×7): qty 1

## 2022-01-10 MED ORDER — VANCOMYCIN HCL IN DEXTROSE 1-5 GM/200ML-% IV SOLN
1000.0000 mg | Freq: Once | INTRAVENOUS | Status: AC
Start: 2022-01-10 — End: 2022-01-10
  Administered 2022-01-10: 1000 mg via INTRAVENOUS
  Filled 2022-01-10: qty 200

## 2022-01-10 MED ORDER — PHENOBARBITAL 32.4 MG PO TABS
64.8000 mg | ORAL_TABLET | ORAL | Status: DC
  Administered 2022-01-10 – 2022-01-16 (×4): 64.8 mg via ORAL
  Filled 2022-01-10 (×4): qty 2

## 2022-01-10 MED ORDER — CARVEDILOL 25 MG PO TABS
25.0000 mg | ORAL_TABLET | Freq: Two times a day (BID) | ORAL | Status: DC
  Administered 2022-01-10: 25 mg via ORAL
  Filled 2022-01-10 (×2): qty 1

## 2022-01-10 MED ORDER — SODIUM CHLORIDE 0.9 % IV SOLN
2.0000 g | Freq: Once | INTRAVENOUS | Status: AC
  Administered 2022-01-10: 2 g via INTRAVENOUS
  Filled 2022-01-10: qty 12.5

## 2022-01-10 MED ORDER — ADULT MULTIVITAMIN W/MINERALS CH
1.0000 | ORAL_TABLET | Freq: Every day | ORAL | Status: DC
  Administered 2022-01-10 – 2022-01-16 (×7): 1 via ORAL
  Filled 2022-01-10 (×7): qty 1

## 2022-01-10 MED ORDER — VANCOMYCIN HCL IN DEXTROSE 1-5 GM/200ML-% IV SOLN
1000.0000 mg | Freq: Once | INTRAVENOUS | Status: AC
  Administered 2022-01-10: 1000 mg via INTRAVENOUS
  Filled 2022-01-10: qty 200

## 2022-01-10 MED ORDER — MAGNESIUM SULFATE 2 GM/50ML IV SOLN
2.0000 g | Freq: Once | INTRAVENOUS | Status: AC
  Administered 2022-01-10: 2 g via INTRAVENOUS
  Filled 2022-01-10: qty 50

## 2022-01-10 MED ORDER — POTASSIUM CHLORIDE 10 MEQ/100ML IV SOLN
10.0000 meq | INTRAVENOUS | Status: AC
  Administered 2022-01-10 (×5): 10 meq via INTRAVENOUS
  Filled 2022-01-10 (×4): qty 100

## 2022-01-10 MED ORDER — ACETAMINOPHEN 325 MG PO TABS
650.0000 mg | ORAL_TABLET | Freq: Four times a day (QID) | ORAL | Status: DC | PRN
  Administered 2022-01-10 – 2022-01-16 (×6): 650 mg via ORAL
  Filled 2022-01-10 (×6): qty 2

## 2022-01-10 MED ORDER — LORAZEPAM 2 MG/ML IJ SOLN: 1.0000 mg | INTRAMUSCULAR | Status: DC | PRN

## 2022-01-10 MED ORDER — FUROSEMIDE 40 MG PO TABS
40.0000 mg | ORAL_TABLET | Freq: Every morning | ORAL | Status: DC
Start: 2022-01-10 — End: 2022-01-11
  Administered 2022-01-10: 40 mg via ORAL
  Filled 2022-01-10: qty 1

## 2022-01-10 MED ORDER — OXYCODONE-ACETAMINOPHEN 5-325 MG PO TABS
1.0000 | ORAL_TABLET | ORAL | Status: DC | PRN
  Administered 2022-01-14: 1 via ORAL
  Filled 2022-01-10: qty 1

## 2022-01-10 MED ORDER — DM-GUAIFENESIN ER 30-600 MG PO TB12
1.0000 | ORAL_TABLET | Freq: Two times a day (BID) | ORAL | Status: DC | PRN
  Administered 2022-01-10: 1 via ORAL
  Filled 2022-01-10 (×2): qty 1

## 2022-01-10 MED ORDER — OXYCODONE HCL 5 MG PO TABS: 5.0000 mg | ORAL_TABLET | Freq: Three times a day (TID) | ORAL | Status: DC | PRN

## 2022-01-10 MED ORDER — VITAMIN D 25 MCG (1000 UNIT) PO TABS
1000.0000 [IU] | ORAL_TABLET | Freq: Every morning | ORAL | Status: DC
  Administered 2022-01-11 – 2022-01-16 (×6): 1000 [IU] via ORAL
  Filled 2022-01-10 (×6): qty 1

## 2022-01-10 MED ORDER — SODIUM CHLORIDE 0.9 % IV BOLUS
500.0000 mL | Freq: Once | INTRAVENOUS | Status: AC
  Administered 2022-01-10: 500 mL via INTRAVENOUS

## 2022-01-10 MED ORDER — ONDANSETRON HCL 4 MG/2ML IJ SOLN: 4.0000 mg | Freq: Three times a day (TID) | INTRAMUSCULAR | Status: DC | PRN

## 2022-01-10 MED ORDER — POTASSIUM CHLORIDE CRYS ER 20 MEQ PO TBCR
40.0000 meq | EXTENDED_RELEASE_TABLET | ORAL | Status: AC
  Administered 2022-01-10 (×2): 40 meq via ORAL
  Filled 2022-01-10 (×2): qty 2

## 2022-01-10 MED ORDER — FOLIC ACID 1 MG PO TABS
1.0000 mg | ORAL_TABLET | Freq: Every morning | ORAL | Status: DC
  Administered 2022-01-11 – 2022-01-16 (×6): 1 mg via ORAL
  Filled 2022-01-10 (×6): qty 1

## 2022-01-10 MED ORDER — ARIPIPRAZOLE 5 MG PO TABS
20.0000 mg | ORAL_TABLET | Freq: Every morning | ORAL | Status: DC
  Administered 2022-01-11 – 2022-01-16 (×6): 20 mg via ORAL
  Filled 2022-01-10 (×5): qty 4
  Filled 2022-01-10: qty 2
  Filled 2022-01-10 (×2): qty 4

## 2022-01-10 MED ORDER — VANCOMYCIN VARIABLE DOSE PER UNSTABLE RENAL FUNCTION (PHARMACIST DOSING): Status: DC

## 2022-01-10 MED ORDER — METRONIDAZOLE 500 MG/100ML IV SOLN
500.0000 mg | Freq: Once | INTRAVENOUS | Status: AC
  Administered 2022-01-10: 500 mg via INTRAVENOUS
  Filled 2022-01-10 (×2): qty 100

## 2022-01-10 MED ORDER — ENOXAPARIN SODIUM 60 MG/0.6ML IJ SOSY
0.5000 mg/kg | PREFILLED_SYRINGE | INTRAMUSCULAR | Status: DC
  Administered 2022-01-10 – 2022-01-16 (×7): 57.5 mg via SUBCUTANEOUS
  Filled 2022-01-10 (×7): qty 0.6

## 2022-01-10 MED ORDER — FERROUS SULFATE 325 (65 FE) MG PO TABS
325.0000 mg | ORAL_TABLET | Freq: Every day | ORAL | Status: DC
  Administered 2022-01-11 – 2022-01-16 (×6): 325 mg via ORAL
  Filled 2022-01-10 (×6): qty 1

## 2022-01-10 MED ORDER — SODIUM CHLORIDE 0.9 % IV SOLN
2.0000 g | Freq: Two times a day (BID) | INTRAVENOUS | Status: DC
  Administered 2022-01-10 – 2022-01-14 (×7): 2 g via INTRAVENOUS
  Filled 2022-01-10: qty 12.5
  Filled 2022-01-10 (×4): qty 2
  Filled 2022-01-10 (×3): qty 12.5

## 2022-01-10 MED ORDER — BUPROPION HCL ER (XL) 150 MG PO TB24
300.0000 mg | ORAL_TABLET | Freq: Every day | ORAL | Status: DC
  Administered 2022-01-10 – 2022-01-15 (×6): 300 mg via ORAL
  Filled 2022-01-10 (×7): qty 2

## 2022-01-10 MED ORDER — LORATADINE 10 MG PO TABS
10.0000 mg | ORAL_TABLET | Freq: Every day | ORAL | Status: DC
  Administered 2022-01-10 – 2022-01-16 (×7): 10 mg via ORAL
  Filled 2022-01-10 (×7): qty 1

## 2022-01-10 MED ORDER — CRANBERRY 250 MG PO CAPS: 250.0000 mg | ORAL_CAPSULE | Freq: Two times a day (BID) | ORAL | Status: DC

## 2022-01-10 MED ORDER — LACTATED RINGERS IV SOLN: INTRAVENOUS | Status: DC

## 2022-01-10 NOTE — Assessment & Plan Note (Signed)
2D echo on 10/12/2018 showed EF> 55%.  Patient has bilateral leg edema. no worsening shortness of breath.  Oxygen saturation 99% on room air.  Patient does not seem to have acute CHF exacerbation.  His BNP is elevated 550, obviously pt is at risk of developing CHF exacerbation. -Continue home Lasix 40 mg daily -Will not give IV Lasix due to sepsis

## 2022-01-10 NOTE — Assessment & Plan Note (Addendum)
Pt has left lower extremity cellulitis in left foot and lower leg.  - will admit to tele bed as inpatient - Empiric antimicrobial treatment with vancomycin and cefepime (patient received 1 dose of Flagyl in ED) - Blood cultures x 2  - ESR and CRP - wound care consult - As needed Percocet and Tylenol for pain, Zofran for nausea - f/u LE doppler to r/o DVT

## 2022-01-10 NOTE — Assessment & Plan Note (Signed)
Stable -Bronchodilators 

## 2022-01-10 NOTE — Assessment & Plan Note (Signed)
-  Seizure precaution -As needed Ativan for seizure -Continue home phenobarbital

## 2022-01-10 NOTE — ED Triage Notes (Signed)
[facility] reports pt coming from [facility]. Staff states pt has been altered for the past 3 days. Pt w/wound on his left toe and left calf.

## 2022-01-10 NOTE — Assessment & Plan Note (Signed)
UA positive for UTI. Pt has hx of positive culture of VRE. -on cefepime -F/u urine culture

## 2022-01-10 NOTE — Assessment & Plan Note (Signed)
Pravastatin  

## 2022-01-10 NOTE — Assessment & Plan Note (Signed)
Possibly due to UTI.  CT head negative. -Frequent neurochecks 

## 2022-01-10 NOTE — Assessment & Plan Note (Signed)
Proscar 

## 2022-01-10 NOTE — Assessment & Plan Note (Signed)
Recent baseline creatinine 1.0-1.4.  His creatinine is 1.51, BUN 37, close to baseline. -Follow-up by BMP

## 2022-01-10 NOTE — Assessment & Plan Note (Addendum)
K 2.2>>3.2 and Mg 1.5>>2.2 -repleated K and Mg as needed

## 2022-01-10 NOTE — Consult Note (Signed)
Pharmacy Antibiotic Note  [name] is a 81 y.o. male with medical history including C diff, VRE, urinary retention with chronic indwelling foley, CHF, COPD admitted on 01/10/2022 with  left leg cellulitis and UTI .  Pharmacy has been consulted for cefepime and vancomycin dosing.  Plan:  Cefepime 2 g IV q12h  Vancomycin 2 g IV LD and then dose per levels for now. Scr 1.51 on admission and un-clear baseline --Daily Scr per protocol --Continue to assess for levels versus starting maintenance dose  Height: 5\' 10"  (177.8 cm) Weight: 116.1 kg (256 lb) IBW/kg (Calculated) : 73  Temp (24hrs), Avg:100 F (37.8 C), Min:99.5 F (37.5 C), Max:100.8 F (38.2 C)  Recent Labs  Lab 01/10/22 0757  WBC 18.5*  CREATININE 1.51*  LATICACIDVEN 0.9    Estimated Creatinine Clearance: 50.6 mL/min (A) (by C-G formula based on SCr of 1.51 mg/dL (H)).    Allergies  Allergen Reactions   Penicillin G Rash and Other (See Comments)    Convulsions.    Has patient had a PCN reaction causing immediate rash, facial/tongue/throat swelling, SOB or lightheadedness with hypotension: not sure Has patient had a PCN reaction causing severe rash involving mucus membranes or skin necrosis: not sure Has patient had a PCN reaction that required hospitalization: not sure Has patient had a PCN reaction occurring within the last 10 years: not sure If all of the above answers are "NO", then may proceed with Cephalosporin use.    Antimicrobials this admission: Metronidazole 6/4 x 1 Cefepime 6/4 >>  Vancomycin 6/4 >>   Dose adjustments this admission: N/A  Microbiology results: 6/4 BCx: pending 6/4 UCx: pending  6/4 MRSA PCR: pending  Thank you for allowing pharmacy to be a part of this patient's care.  8/4 01/10/2022 12:50 PM

## 2022-01-10 NOTE — Assessment & Plan Note (Addendum)
Sepsis due to UTI and cellulitis of left lower extremity: Meets criteria for sepsis with WBC 18.5, RR 22.  Lactic acid normal. -Antibiotics as above -Urine culture with multiple species and blood cultures negative so far -IV fluid: Patient received 500 cc normal saline (patient has elevated BNP 550, lipidemia aggressive IV fluid treatment)

## 2022-01-10 NOTE — Assessment & Plan Note (Signed)
-   IV hydralazine as needed -Hold amlodipine due to sepsis and risk of developing hypotension -Coreg

## 2022-01-10 NOTE — ED Provider Notes (Signed)
[facility] Provider Note    Event Date/Time   First MD Initiated Contact with Patient 01/10/22 [phone]     (approximate)   History   Altered Mental Status  Level v Caveat: AMS  HPI  Cephas DarbyJerry L Fuller is a 81 y.o. male extensive past medical history including C. difficile, VRE, urinary retention with chronic indwelling Foley, schizophrenia, CHF, COPD [facility] [place] [facility] with chief complaint of 3 days of altered mental status.  Patient currently nonverbal not providing any additional history.     Physical Exam   Triage Vital Signs: ED Triage Vitals  Enc Vitals Group     BP --      Pulse Rate 01/10/22 0751 82     Resp 01/10/22 0751 20     Temp 01/10/22 0751 99.5 F (37.5 C)     Temp Source 01/10/22 0751 Oral     SpO2 01/10/22 0751 97 %     Weight 01/10/22 0749 256 lb (116.1 kg)     Height 01/10/22 0749 5\' 10"  (1.778 m)     Head Circumference --      Peak Flow --      Pain Score 01/10/22 0749 0     Pain Loc --      Pain Edu? --      Excl. in GC? --     Most recent vital signs: Vitals:   01/10/22 0837 01/10/22 0900  BP:  132/74  Pulse:  77  Resp:  19  Temp: (S) 99.7 F (37.6 C)   SpO2:  93%     Constitutional: Alert, chronically ill-appearing, covered in feces Eyes: Conjunctivae are normal.  Head: Atraumatic. Nose: No congestion/rhinnorhea. Mouth/Throat: Mucous membranes are moist.   Neck: Painless ROM.  Cardiovascular:   Good peripheral circulation. No m/g/r Respiratory: Normal respiratory effort.  No retractions. No wheeze or rales Gastrointestinal: Soft and nontender.  No scrotal cellulitis or induration to suggest Fournier's Musculoskeletal: Bilateral lower extremity cellulitis with pressure wound to base of right lateral heel. Neurologic:  encephalopathic Skin:  Skin is warm, dry and intact. No rash noted. Psychiatric: calm and cooperative    ED Results / Procedures / Treatments   Labs (all labs  ordered are listed, but only abnormal results are displayed) Labs Reviewed  COMPREHENSIVE METABOLIC PANEL - Abnormal; Notable for the following components:      Result Value   Potassium 2.2 (*)    Chloride 112 (*)    Glucose, Bld 121 (*)    BUN 37 (*)    Creatinine, Ser 1.51 (*)    Calcium 7.7 (*)    Albumin 2.1 (*)    AST 88 (*)    Alkaline Phosphatase 136 (*)    GFR, Estimated 47 (*)    Anion gap 4 (*)    All other components within normal limits  CBC WITH DIFFERENTIAL/PLATELET - Abnormal; Notable for the following components:   WBC 18.5 (*)    RBC 3.94 (*)    Hemoglobin 10.7 (*)    HCT 33.6 (*)    Neutro Abs 16.1 (*)    Abs Immature Granulocytes 0.46 (*)    All other components within normal limits  PROTIME-INR - Abnormal; Notable for the following components:   Prothrombin Time 15.9 (*)    INR 1.3 (*)    All other components within normal limits  BLOOD GAS, VENOUS - Abnormal; Notable for the following components:   pCO2, Ven 43 (*)  Bicarbonate 28.5 (*)    Acid-Base Excess 3.7 (*)    All other components within normal limits  RESP PANEL BY RT-PCR (FLU A&B, COVID) ARPGX2  CULTURE, BLOOD (ROUTINE X 2)  CULTURE, BLOOD (ROUTINE X 2)  URINE CULTURE  LACTIC ACID, PLASMA  APTT  CK  LACTIC ACID, PLASMA  URINALYSIS, COMPLETE (UACMP) WITH MICROSCOPIC  MAGNESIUM     EKG  ED ECG REPORT I, Willy Eddy, the attending physician, personally viewed and interpreted this ECG.   Date: 01/10/2022  EKG Time: 7:50  Rate: 80  Rhythm: sinus  Axis: normal  Intervals: lbbb  ST&T Change: no stemi, no depressions    RADIOLOGY Please see ED Course for my review and interpretation.  I personally reviewed all radiographic images ordered to evaluate for the above acute complaints and reviewed radiology reports and findings.  These findings were personally discussed with the patient.  Please see medical record for radiology report.    PROCEDURES:  Critical Care  performed: Yes, see critical care procedure note(s)  .Critical Care Performed by: Willy Eddy, MD Authorized by: Willy Eddy, MD   Critical care provider statement:    Critical care time (minutes):  35   Critical care was necessary to treat or prevent imminent or life-threatening deterioration of the following conditions:  Metabolic crisis   Critical care was time spent personally by me on the following activities:  Ordering and performing treatments and interventions, ordering and review of laboratory studies, ordering and review of radiographic studies, pulse oximetry, re-evaluation of patient's condition, review of old charts, obtaining history from patient or surrogate, examination of patient, evaluation of patient's response to treatment, discussions with primary provider, discussions with consultants and development of treatment plan with patient or surrogate   MEDICATIONS ORDERED IN ED: Medications  lactated ringers infusion ( Intravenous New Bag/Given 01/10/22 0804)  potassium chloride 10 mEq in 100 mL IVPB (10 mEq Intravenous New Bag/Given 01/10/22 0900)  ceFEPIme (MAXIPIME) 2 g in sodium chloride 0.9 % 100 mL IVPB (2 g Intravenous New Bag/Given 01/10/22 0921)  metroNIDAZOLE (FLAGYL) IVPB 500 mg (has no administration in time range)  vancomycin (VANCOCIN) IVPB 1000 mg/200 mL premix (has no administration in time range)  sodium chloride 0.9 % bolus 500 mL (has no administration in time range)  potassium chloride SA (KLOR-CON M) CR tablet 40 mEq (has no administration in time range)     IMPRESSION / MDM / ASSESSMENT AND PLAN / ED COURSE  I reviewed the triage vital signs and the nursing notes.                              Differential diagnosis includes, but is not limited to, Dehydration, sepsis, pna, uti, hypoglycemia, cva, drug effect, withdrawal, encephalitis  Patient presenting to the ER with symptoms as described above.  Patient is ill-appearing unable to provide  much additional history with altered mental status for 3 days.  This presenting complaint could reflect a potentially life-threatening illness therefore the patient will be placed on continuous pulse oximetry and telemetry for monitoring.  Laboratory evaluation will be sent to evaluate for the above complaints.  CT imaging will be ordered for the above differential.   Clinical Course as of 01/10/22 0932  Sun Jan 10, 2022  0829 Patient afebrile lactate normal.  Does have leukocytosis but otherwise not meeting septic criteria.  Chest x-ray on my interpretation does not show any evidence of infiltrate. [PR]  2297 Patient with critically low potassium of 2.2 will replete with IV. [PR]  [phone] Patient with multiple wounds with fecal contamination.  Given leukocytosis low-grade temperature will cover with antibiotics.  Given altered mental status extensive comorbidities hypokalemia presentation will require hospitalization.  I have consulted hospitalist for admission.  Does also have mild AKI.  We will give gentle IV hydration. [PR]    Clinical Course User Index [PR] Willy Eddy, MD    Patient's presentation is most consistent with acute presentation with potential threat to life or bodily function.   FINAL CLINICAL IMPRESSION(S) / ED DIAGNOSES   Final diagnoses:  Altered mental status, unspecified altered mental status type  Hypokalemia  Cellulitis of left lower extremity     Rx / DC Orders   ED Discharge Orders     None        Note:  This document was prepared using Dragon voice recognition software and may include unintentional dictation errors.    Willy Eddy, MD 01/10/22 [phone]

## 2022-01-10 NOTE — ED Notes (Signed)
ED TO INPATIENT HANDOFF REPORT  ED Nurse Name and Phone #: Delice Bison, RN  S Name/Age/Gender [name] 81 y.o. male Room/Bed: ED07A/ED07A  Code Status   Code Status: Prior  Home[facility]SNF/Other [name] Patient oriented to: self Is this baseline? No   Triage Complete: Triage complete  Chief Complaint Cellulitis of left foot [L03.116]  Triage Not [facility] reports [facility] coming from [place] [facility]. Staff states pt has been altered for the past 3 days. Pt w/wound on his left toe and left calf.    Allergies Allergies  Allergen Reactions   Penicillin G Rash and Other (See Comments)    Convulsions.    Has patient had a PCN reaction causing immediate rash, facial/tongue/throat swelling, SOB or lightheadedness with hypotension: not sure Has patient had a PCN reaction causing severe rash involving mucus membranes or skin necrosis: not sure Has patient had a PCN reaction that required hospitalization: not sure Has patient had a PCN reaction occurring within the last 10 years: not sure If all of the above answers are "NO", then may proceed with Cephalosporin use.    Level of Care/Admitting Diagnosis ED Disposition     ED Disposition  Admit   Condition  --   Comment  Hospital [facility] [[id number]]  Level of Care: Telemetry Medical [104]  Covid Evaluation: Confirmed COVID Negative  Diagnosis: Cellulitis of left foot [938182]  Admitting Physician: Lorretta Harp [[id number]]  Attending Physician: Lorretta Harp [[id number]]  Estimated length of stay: past midnight tomorrow  Certification:: I certify this patient will need inpatient services for at least 2 midnights          B Medical/Surgery History Past Medical History:  Diagnosis Date   Bipolar 1 disorder (HCC)    C. difficile diarrhea 05/2016   history of ...   CHF (congestive heart failure) (HCC)    Chronic kidney disease 05/21/2016   acute renal failure with sepsis and uti   COPD  (chronic obstructive pulmonary disease) (HCC)    DVT (deep venous thrombosis) (HCC)    GERD (gastroesophageal reflux disease)    Hematuria    High cholesterol    History of kidney stones    Hypertension    Penile erosion 2018   d/t frequent foley catheters   Schizophrenia (HCC)    Seizure (HCC)    on phenobarb   Urinary retention    frequently requiring foley placement   VRE (vancomycin resistant enterococcus) culture positive    urine   Past Surgical History:  Procedure Laterality Date   EXTRACORPOREAL SHOCK WAVE LITHOTRIPSY Right 05/05/2017   Procedure: EXTRACORPOREAL SHOCK WAVE LITHOTRIPSY (ESWL);  Surgeon: Vanna Scotland, MD;  Location: [facility] ORS;  Service: Urology;  Laterality: Right;   IR CATHETER TUBE CHANGE  06/23/2017   IR CATHETER TUBE CHANGE  06/28/2017     A IV Location/Drains/Wounds Patient Lines/Drains/Airways Status     Active Line/Drains/Airways     Name Placement date Placement time Site Days   Peripheral IV 01/10/22 20 G 1" Anterior;Right Forearm 01/10/22  0759  Forearm  less than 1   Peripheral IV 01/10/22 20 G 1" Anterior;Left Forearm 01/10/22  0818  Forearm  less than 1   Urethral Catheter Megan, RN Latex 16 Fr. 12/23/16  1700  Latex  1844   Suprapubic Catheter 16 Fr. 06/23/17  1438  --  1662   Suprapubic Catheter  16 Fr. 06/28/17  1640  --  1657   Pressure Injury 04/11/21  Buttocks Medial Stage 2 -  Partial thickness loss of dermis presenting as a shallow open injury with a red, pink wound bed without slough. 04/11/21  0300  -- 274   Wound / Incision (Open or Dehisced) 12/23/16 Leg Right;Lower 12/23/16  2230  Leg  1844   Wound / Incision (Open or Dehisced) 04/11/21 (MASD) Moisture Associated Skin Damage Groin Bilateral 04/11/21  0300  Groin  274            Intake/Output Last 24 hours No intake or output data in the 24 hours ending 01/10/22 1914  Labs/Imaging Results for orders placed or performed during the hospital encounter of 01/10/22 (from  the past 48 hour(s))  Resp Panel by RT-PCR (Flu A&B, Covid) Anterior Nasal Swab     Status: None   Collection Time: 01/10/22  7:57 AM   Specimen: Anterior Nasal Swab  Result Value Ref Range   SARS Coronavirus 2 by RT PCR NEGATIVE NEGATIVE    Comment: (NOTE) SARS-CoV-2 target nucleic acids are NOT DETECTED.  The SARS-CoV-2 RNA is generally detectable in upper respiratory specimens during the acute phase of infection. The lowest concentration of SARS-CoV-2 viral copies this assay can detect is 138 copies/mL. A negative result does not preclude SARS-Cov-2 infection and should not be used as the sole basis for treatment or other patient management decisions. A negative result may occur with  improper specimen collection/handling, submission of specimen other than nasopharyngeal swab, presence of viral mutation(s) within the areas targeted by this assay, and inadequate number of viral copies(<138 copies/mL). A negative result must be combined with clinical observations, patient history, and epidemiological information. The expected result is Negative.  Fact Sheet for Patients:  BloggerCourse.com  Fact Sheet for Healthcare Providers:  SeriousBroker.it  This test is no t yet approved or cleared by the [place] FDA and  has been authorized for detection and/or diagnosis of SARS-CoV-2 by FDA under an Emergency Use Authorization (EUA). This EUA will remain  in effect (meaning this test can be used) for the duration of the COVID-19 declaration under Section 564(b)(1) of the Act, 21 U.S.C.section 360bbb-3(b)(1), unless the authorization is terminated  or revoked sooner.       Influenza A by PCR NEGATIVE NEGATIVE   Influenza B by PCR NEGATIVE NEGATIVE    Comment: (NOTE) The Xpert Xpress SARS-CoV-2/FLU/RSV plus assay is intended as an aid in the diagnosis of influenza from Nasopharyngeal swab specimens and should not be used as a sole  basis for treatment. Nasal washings and aspirates are unacceptable for Xpert Xpress SARS-CoV-2/FLU/RSV testing.  Fact Sheet for Patients: BloggerCourse.com  Fact Sheet for Healthcare Providers: SeriousBroker.it  This test is not yet approved or cleared by the [place] FDA and has been authorized for detection and/or diagnosis of SARS-CoV-2 by FDA under an Emergency Use Authorization (EUA). This EUA will remain in effect (meaning this test can be used) for the duration of the COVID-19 declaration under Section 564(b)(1) of the Act, 21 U.S.C. section 360bbb-3(b)(1), unless the authorization is terminated or revoked.  Performed at [facility], [address] [place], [place] [address]   Lactic acid, plasma     Status: None   Collection Time: 01/10/22  7:57 AM  Result Value Ref Range   Lactic Acid, Venous 0.9 0.5 - 1.9 mmol/L    Comment: Performed at [facility], [address], [place], [place] [address]  Comprehensive metabolic panel     Status: Abnormal   Collection Time: 01/10/22  7:57 AM  Result Value Ref Range   Sodium 141 135 - 145 mmol/L   Potassium 2.2 (LL) 3.5 - 5.1 mmol/L    Comment: CRITICAL RESULT CALLED TO, READ BACK BY AND VERIFIED WITH  KARA DELROSSA  01/10/22 @ 0834 BY SB    Chloride 112 (H) 98 - 111 mmol/L   CO2 25 22 - 32 mmol/L   Glucose, Bld 121 (H) 70 - 99 mg/dL    Comment: Glucose reference range applies only to samples taken after fasting for at least 8 hours.   BUN 37 (H) 8 - 23 mg/dL   Creatinine, Ser 1.61 (H) 0.61 - 1.24 mg/dL   Calcium 7.7 (L) 8.9 - 10.3 mg/dL   Total Protein 6.6 6.5 - 8.1 g/dL   Albumin 2.1 (L) 3.5 - 5.0 g/dL   AST 88 (H) 15 - 41 U/L   ALT 39 0 - 44 U/L   Alkaline Phosphatase 136 (H) 38 - 126 U/L   Total Bilirubin 0.8 0.3 - 1.2 mg/dL   GFR, Estimated 47 (L) >60 mL/min    Comment: (NOTE) Calculated using the CKD-EPI Creatinine Equation (2021)    Anion  gap 4 (L) 5 - 15    Comment: Performed at [facility], [address] [place], [place] [address]  CBC with Differential     Status: Abnormal   Collection Time: 01/10/22  7:57 AM  Result Value Ref Range   WBC 18.5 (H) 4.0 - 10.5 K/uL   RBC 3.94 (L) 4.22 - 5.81 MIL/uL   Hemoglobin 10.7 (L) 13.0 - 17.0 g/dL   HCT 54.0 (L) 98.1 - 19.1 %   MCV 85.3 80.0 - 100.0 fL   MCH 27.2 26.0 - 34.0 pg   MCHC 31.8 30.0 - 36.0 g/dL   RDW 47.8 29.5 - 62.1 %   Platelets 304 150 - 400 K/uL   nRBC 0.0 0.0 - 0.2 %   Neutrophils Relative % 87 %   Neutro Abs 16.1 (H) 1.7 - 7.7 K/uL   Lymphocytes Relative 7 %   Lymphs Abs 1.3 0.7 - 4.0 K/uL   Monocytes Relative 2 %   Monocytes Absolute 0.4 0.1 - 1.0 K/uL   Eosinophils Relative 1 %   Eosinophils Absolute 0.1 0.0 - 0.5 K/uL   Basophils Relative 0 %   Basophils Absolute 0.0 0.0 - 0.1 K/uL   Immature Granulocytes 3 %   Abs Immature Granulocytes 0.46 (H) 0.00 - 0.07 K/uL    Comment: Performed at [facility], [address] [place], [place] [address]  Protime-INR     Status: Abnormal   Collection Time: 01/10/22  7:57 AM  Result Value Ref Range   Prothrombin Time 15.9 (H) 11.4 - 15.2 seconds   INR 1.3 (H) 0.8 - 1.2    Comment: (NOTE) INR goal varies based on device and disease states. Performed at [facility], [address] [place], [place] [address]   APTT     Status: None   Collection Time: 01/10/22  7:57 AM  Result Value Ref Range   aPTT 33 24 - 36 seconds    Comment: Performed at [facility], [address] [place], [place] [address]  Urinalysis, Complete w Microscopic Urine, Catheterized     Status: Abnormal   Collection Time: 01/10/22  7:57 AM  Result Value Ref Range   Color, Urine YELLOW (A) YELLOW   APPearance CLOUDY (A) CLEAR   Specific Gravity, Urine 1.012 1.005 - 1.030  pH 8.0 5.0 - 8.0   Glucose, UA NEGATIVE NEGATIVE mg/dL   Hgb urine dipstick MODERATE (A) NEGATIVE   Bilirubin Urine  NEGATIVE NEGATIVE   Ketones, ur NEGATIVE NEGATIVE mg/dL   Protein, ur 144 (A) NEGATIVE mg/dL   Nitrite NEGATIVE NEGATIVE   Leukocytes,Ua MODERATE (A) NEGATIVE   RBC / HPF 21-50 0 - 5 RBC/hpf   WBC, UA >50 (H) 0 - 5 WBC/hpf   Bacteria, UA MANY (A) NONE SEEN   Squamous Epithelial / LPF NONE SEEN 0 - 5   WBC Clumps PRESENT    Mucus PRESENT     Comment: Performed at [facility], [address] [place], [place] [address]  CK     Status: None   Collection Time: 01/10/22  7:57 AM  Result Value Ref Range   Total CK 88 49 - 397 U/L    Comment: Performed at [facility] [address] [place], [place] [address]  Blood gas, venous     Status: Abnormal (Preliminary result)   Collection Time: 01/10/22  9:00 AM  Result Value Ref Range   pH, Ven 7.43 7.25 - 7.43   pCO2, Ven 43 (L) 44 - 60 mmHg   pO2, Ven PENDING 32 - 45 mmHg   Bicarbonate 28.5 (H) 20.0 - 28.0 mmol/L   Acid-Base Excess 3.7 (H) 0.0 - 2.0 mmol/L   O2 Saturation 41.8 %   Patient temperature 37.0    Collection site VENOUS     Comment: Performed at [facility], [address], [place], [place] [address]   CT HEAD WO CONTRAST ( )  Result Date: 01/10/2022 CLINICAL DATA:  Altered mental status for 3 days. EXAM: CT HEAD WITHOUT CONTRAST TECHNIQUE: Contiguous axial images were obtained from the base of the skull through the vertex without intravenous contrast. RADIATION DOSE REDUCTION: This exam was performed according to the departmental dose-optimization program which includes automated exposure control, adjustment of the mA and/or kV according to patient size and/or use of iterative reconstruction technique. COMPARISON:  09/22/2016 FINDINGS: Brain: No evidence of intracranial hemorrhage, acute infarction, hydrocephalus, extra-axial collection, or mass lesion/mass effect. Mild-to-moderate diffuse cerebral and cerebellar atrophy is unchanged in appearance. Moderate chronic small vessel disease and old  bilateral basal ganglia lacunar infarcts are again demonstrated. Vascular:  No hyperdense vessel or other acute findings. Skull: No evidence of fracture or other significant bone abnormality. Sinuses/Orbits:  No acute findings. Other: None. IMPRESSION: No acute intracranial abnormality. Stable cerebral and cerebellar atrophy, chronic small vessel disease, and old bilateral basal ganglia lacunar infarcts. Electronically Signed   By: Danae Orleans M.D.   On: 01/10/2022 09:05   DG Chest Port 1 View  Result Date: 01/10/2022 CLINICAL DATA:  Sepsis.  Altered mental status.  COPD. EXAM: PORTABLE CHEST 1 VIEW COMPARISON:  12/25/2016 FINDINGS: The heart size and mediastinal contours are within normal limits. Aortic atherosclerotic calcification incidentally noted. Low lung volumes are again noted. Both lungs are clear. IMPRESSION: No active disease. Electronically Signed   By: Danae Orleans M.D.   On: 01/10/2022 08:21   DG Foot Complete Left  Result Date: 01/10/2022 CLINICAL DATA:  Cellulitis, concern for osteomyelitis EXAM: LEFT FOOT - COMPLETE 3+ VIEW COMPARISON:  None Available. FINDINGS: Hammertoe deformity of the great toe. Diffuse soft tissue swelling over the dorsum of the foot and the anterior lower leg. No conventional radiographic evidence of osteomyelitis visualized. No fracture or malalignment. The bones appear diffusely demineralized. IMPRESSION: No conventional radiographic evidence of osteomyelitis. Hammertoe  deformity of the first and second toes. Electronically Signed   By: Malachy MoanHeath  McCullough M.D.   On: 01/10/2022 08:27    Pending Labs Unresulted Labs (From admission, onward)     Start     Ordered   01/10/22 0934  Sedimentation rate  Add-on,   AD        01/10/22 0933   01/10/22 0934  C-reactive protein  Add-on,   AD        01/10/22 0933   01/10/22 0934  Brain natriuretic peptide  Add-on,   AD        01/10/22 0933   01/10/22 0840  Magnesium  Once,   STAT        01/10/22 0839   01/10/22 0750   Lactic acid, plasma  (Septic presentation on arrival (screening labs, nursing and treatment orders for obvious sepsis))  Now then every 2 hours,   STAT      01/10/22 0749   01/10/22 0750  Blood Culture (routine x 2)  (Septic presentation on arrival (screening labs, nursing and treatment orders for obvious sepsis))  BLOOD CULTURE X 2,   STAT      01/10/22 0749   01/10/22 0750  Urine Culture  (Septic presentation on arrival (screening labs, nursing and treatment orders for obvious sepsis))  ONCE - URGENT,   URGENT       Question:  Indication  Answer:  Sepsis   01/10/22 0749            Vitals/Pain Today's Vitals   01/10/22 0800 01/10/22 0830 01/10/22 0837 01/10/22 0900  BP: 137/73 (!) 120/52  132/74  Pulse: 83 82  77  Resp: (!) 22   19  Temp:   (S) 99.7 F (37.6 C)   TempSrc:   (S) Rectal   SpO2: 99% 100%  93%  Weight:      Height:      PainSc:        Isolation Precautions No active isolations  Medications Medications  lactated ringers infusion ( Intravenous Rate/Dose Change 01/10/22 0935)  potassium chloride 10 mEq in 100 mL IVPB (10 mEq Intravenous New Bag/Given 01/10/22 0900)  ceFEPIme (MAXIPIME) 2 g in sodium chloride 0.9 % 100 mL IVPB (2 g Intravenous New Bag/Given 01/10/22 0921)  metroNIDAZOLE (FLAGYL) IVPB 500 mg (has no administration in time range)  vancomycin (VANCOCIN) IVPB 1000 mg/200 mL premix (has no administration in time range)  sodium chloride 0.9 % bolus 500 mL (has no administration in time range)  potassium chloride SA (KLOR-CON M) CR tablet 40 mEq (has no administration in time range)  albuterol (PROVENTIL) (2.5 MG/3ML) 0.083% nebulizer solution 2.5 mg (has no administration in time range)  dextromethorphan-guaiFENesin (MUCINEX DM) 30-600 MG per 12 hr tablet 1 tablet (has no administration in time range)  LORazepam (ATIVAN) injection 1 mg (has no administration in time range)  ondansetron (ZOFRAN) injection 4 mg (has no administration in time range)   hydrALAZINE (APRESOLINE) injection 5 mg (has no administration in time range)  acetaminophen (TYLENOL) tablet 650 mg (has no administration in time range)    Mobility non-ambulatory Moderate fall risk   Focused Assessments Cardiac Assessment Handoff:    Lab Results  Component Value Date   CKTOTAL 88 01/10/2022   CKMB 0.7 10/02/2012   TROPONINI <0.03 12/23/2016   No results found for: DDIMER Does the Patient currently have chest pain? No    R Recommendations: See Admitting Provider Note  Report given to:   Additional Notes:

## 2022-01-10 NOTE — Assessment & Plan Note (Signed)
Hemoglobin stable, 10.7 today (10.1 on 04/11/2021) -Follow-up with CBC

## 2022-01-10 NOTE — Progress Notes (Signed)
PHARMACIST - PHYSICIAN ORDER COMMUNICATION  CONCERNING: P&T Medication Policy on Herbal Medications  DESCRIPTION:  This patient's order for: Cranberry capsules has been noted.  This product(s) is classified as an "herbal" or natural product. Due to a lack of definitive safety studies or FDA approval, nonstandard manufacturing practices, plus the potential risk of unknown drug-drug interactions while on inpatient medications, the Pharmacy and Therapeutics Committee does not permit the use of "herbal" or natural products of this type within [facility].   ACTION TAKEN: The pharmacy department is unable to verify this order at this time and your patient has been informed of this safety policy. Please reevaluate patient's clinical condition at discharge and address if the herbal or natural product(s) should be resumed at that time.   Caryl Asp, PharmD Clinical Pharmacist 01/10/2022 11:37 AM

## 2022-01-10 NOTE — H&P (Addendum)
History and Physical    [name] PTW:[id number] DOB: 06-28-1941 DOA: 01/10/2022  Referring MD/NP/PA:   PCP: Merlene Laughter, MD   Patient coming from:  The patient is coming from ALF  Chief Complaint: AMS and left lower leg and foot pain  HPI: [name] is a 81 y.o. 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  Patient has altered mental status, cannot provide accurate medical history.  I have tried to call his family without success.    Per report, patient has been confused in the past several days.  When I saw patient in the emergency room, patient is confused, knows his own name, knows that he is in hospital, but is confused about time.  He moves all extremities.  No facial droop or slurred speech.  He has pressure ulcers in both heels.  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  The left lower leg and foot are erythematous and tender.  His body temperature 99.7.  Patient does not have active respiratory distress, cough, nausea, vomiting and diarrhea.  Not sure if patient has symptoms of UTI.   Data Reviewed and ED Course: pt was found to have WBC 18.5, BMP 550, lactic acid 0.9, INR 1.3, positive urinalysis (cloudy appearance, moderate amount of leukocyte, many bacteria, WBC> 50), negative COVID PCR, CK level 88, renal function close to baseline, potassium 2.2, temperature 99.7, blood pressure 132/74, RR 22, HR 83, oxygen saturation 99% on room air.  Chest x-ray negative.  VBG with pH 7.43, CO2 43. CT head negative for acute intracranial abnormalities.  Left foot x-rays showed no evidence of osteomyelitis.  Patient is admitted to telemetry bed as  inpatient.   EKG: I have personally reviewed.  Sinus rhythm, PVC, QTc 388, nonspecific T wave change.  Review of Systems: Could not reviewed accurately due to altered mental status.    Allergy:  Allergies  Allergen Reactions   Penicillin G Rash and Other (See Comments)    Convulsions.    Has patient had a PCN reaction causing immediate rash, facial/tongue/throat swelling, SOB or lightheadedness with hypotension: not sure Has patient had a PCN reaction causing severe rash involving mucus membranes or skin necrosis: not sure Has patient had a PCN reaction that required hospitalization: not sure Has patient had a PCN reaction occurring within the last 10 years: not sure If all of the above answers are "NO", then may proceed with Cephalosporin use.    Past Medical History:  Diagnosis Date   Bipolar 1 disorder ([place])    C. difficile diarrhea 05/2016   history of ...   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   Schizophrenia ([employer])    Seizure ([place])    on phenobarb   Urinary retention    frequently requiring foley placement   VRE (vancomycin resistant enterococcus) culture positive  urine    Past Surgical History:  Procedure Laterality Date   EXTRACORPOREAL SHOCK WAVE LITHOTRIPSY Right 05/05/2017   Procedure: EXTRACORPOREAL SHOCK WAVE LITHOTRIPSY (ESWL);  Surgeon: Hollice Espy, MD;  Location: [facility] ORS;  Service: Urology;  Laterality: Right;   IR CATHETER TUBE CHANGE  06/23/2017   IR CATHETER TUBE CHANGE  06/28/2017    Social History:  reports that he has never smoked. He has never used smokeless tobacco. He reports that he does not  drink alcohol and does not use drugs.  Family History:  Family History  Problem Relation Age of Onset   Cancer Mother    Prostate cancer Neg Hx    Kidney disease Neg Hx    Kidney cancer Neg Hx    Bladder Cancer Neg Hx      Prior to Admission medications   Medication Sig Start Date End Date Taking? Authorizing Provider  acetaminophen (TYLENOL) 325 MG tablet Take 2 tablets (650 mg total) by mouth every 6 (six) hours as needed. 04/26/16   Loletha Grayer, MD  albuterol (VENTOLIN HFA) 108 (90 Base) MCG/ACT inhaler Inhale 2 puffs into the lungs every 6 (six) hours as needed for shortness of breath.    [provider]  amLODipine (NORVASC) 2.5 MG tablet Take 1 tablet (2.5 mg total) by mouth daily. 04/26/16   Loletha Grayer, MD  ARIPiprazole (ABILIFY) 20 MG tablet Take 1 tablet by mouth daily.    [provider]  buPROPion (BUDEPRION XL) 300 MG 24 hr tablet Take 1 tablet (300 mg total) by mouth daily. 04/26/16   Loletha Grayer, MD  carvedilol (COREG) 25 MG tablet Take 1 tablet (25 mg total) by mouth 2 (two) times daily. 04/26/16   Loletha Grayer, MD  Cranberry 300 MG tablet Take 1 tablet (300 mg total) by mouth 2 (two) times daily. 01/23/18   Zara Council A, PA-C  feeding supplement, ENSURE ENLIVE, (ENSURE ENLIVE) LIQD Take 237 mLs by mouth 2 (two) times daily between meals. Patient not taking: Reported on 07/26/2018 12/25/16   Gladstone Lighter, MD  finasteride (PROSCAR) 5 MG tablet Take 1 tablet (5 mg total) by mouth daily. 04/27/16   Loletha Grayer, MD  furosemide (LASIX) 20 MG tablet Take 20 mg by mouth.    [provider]  Multiple Vitamin (MULTI-VITAMINS) TABS Take by mouth.    [provider]  omeprazole (PRILOSEC) 20 MG capsule Take 2 capsules (40 mg total) by mouth daily. Patient taking differently: Take 40 mg by mouth at bedtime.  04/26/16   Loletha Grayer, MD  oxyCODONE (ROXICODONE) 5 MG immediate release tablet Take 1 tablet (5 mg total) by  mouth every 8 (eight) hours as needed. 04/11/21 04/11/22  Danford, Suann Larry, MD  PHENobarbital (LUMINAL) 64.8 MG tablet Take 2 tablets (129.6 mg total) by mouth every other day. 04/26/16   Loletha Grayer, MD  potassium chloride SA (K-DUR,KLOR-CON) 20 MEQ tablet Take 40 mEq by mouth 2 (two) times daily.     [provider]  pravastatin (PRAVACHOL) 40 MG tablet Take 1 tablet (40 mg total) by mouth daily. 04/26/16   Loletha Grayer, MD  tamsulosin (FLOMAX) 0.4 MG CAPS capsule Take 1 capsule (0.4 mg total) by mouth daily. 06/16/16   Nori Riis, PA-C    Physical Exam: Vitals:   01/10/22 0830 01/10/22 0837 01/10/22 0900 01/10/22 1035  BP: (!) 120/52  132/74 (!) 141/73  Pulse: 82  77 82  Resp:   19 18  Temp:  (S) 99.7 F (  37.6 C)  (!) 100.8 F (38.2 C)  TempSrc:  (S) Rectal    SpO2: 100%  93% 96%  Weight:      Height:       General: Not in acute distress HEENT:       Eyes: PERRL, EOMI, no scleral icterus.       ENT: No discharge from the ears and nose       Neck: No JVD, no bruit, no mass felt. Heme: No neck lymph node enlargement. Cardiac: S1/S2, RRR, No murmurs, No gallops or rubs. Respiratory: No rales, wheezing, rhonchi or rubs. GI: Soft, nondistended, nontender, no organomegaly, BS present. GU: No hematuria Ext: has pitting leg edema bilaterally. 1+DP/PT pulse bilaterally. Musculoskeletal: No joint deformities, No joint redness or warmth, no limitation of ROM in spin. Skin: Has chronic venous insufficiency changes in both legs.  The left lower leg and the foot are erythematous, tender, warmth.  Has pressure ulcers in both heels which does not look deep.  Has a small ulceration left great toe.              Neuro: Alert, oriented X3, cranial nerves II-XII grossly intact, moves all extremities  Psych: Patient is not psychotic, no suicidal or hemocidal ideation.  Labs on Admission: I have personally reviewed following labs and imaging  studies  CBC: Recent Labs  Lab 01/10/22 0757  WBC 18.5*  NEUTROABS 16.1*  HGB 10.7*  HCT 33.6*  MCV 85.3  PLT 161   Basic Metabolic Panel: Recent Labs  Lab 01/10/22 0757  NA 141  K 2.2*  CL 112*  CO2 25  GLUCOSE 121*  BUN 37*  CREATININE 1.51*  CALCIUM 7.7*  MG 1.9   GFR: Estimated Creatinine Clearance: 50.6 mL/min (A) (by C-G formula based on SCr of 1.51 mg/dL (H)). Liver Function Tests: Recent Labs  Lab 01/10/22 0757  AST 88*  ALT 39  ALKPHOS 136*  BILITOT 0.8  PROT 6.6  ALBUMIN 2.1*   No results for input(s): LIPASE, AMYLASE in the last 168 hours. No results for input(s): AMMONIA in the last 168 hours. Coagulation Profile: Recent Labs  Lab 01/10/22 0757  INR 1.3*   Cardiac Enzymes: Recent Labs  Lab 01/10/22 0757  CKTOTAL 88   BNP (last 3 results) No results for input(s): PROBNP in the last 8760 hours. HbA1C: No results for input(s): HGBA1C in the last 72 hours. CBG: No results for input(s): GLUCAP in the last 168 hours. Lipid Profile: No results for input(s): CHOL, HDL, LDLCALC, TRIG, CHOLHDL, LDLDIRECT in the last 72 hours. Thyroid Function Tests: No results for input(s): TSH, T4TOTAL, FREET4, T3FREE, THYROIDAB in the last 72 hours. Anemia Panel: No results for input(s): VITAMINB12, FOLATE, FERRITIN, TIBC, IRON, RETICCTPCT in the last 72 hours. Urine analysis:    Component Value Date/Time   COLORURINE YELLOW (A) 01/10/2022 0757   APPEARANCEUR CLOUDY (A) 01/10/2022 0757   APPEARANCEUR Hazy 10/02/2012 0915   LABSPEC 1.012 01/10/2022 0757   LABSPEC 1.027 10/02/2012 0915   PHURINE 8.0 01/10/2022 0757   GLUCOSEU NEGATIVE 01/10/2022 0757   GLUCOSEU Negative 10/02/2012 0915   HGBUR MODERATE (A) 01/10/2022 0757   BILIRUBINUR NEGATIVE 01/10/2022 0757   BILIRUBINUR Negative 10/02/2012 0915   KETONESUR NEGATIVE 01/10/2022 0757   PROTEINUR 100 (A) 01/10/2022 0757   NITRITE NEGATIVE 01/10/2022 0757   LEUKOCYTESUR MODERATE (A) 01/10/2022 0757    LEUKOCYTESUR 1+ 10/02/2012 0915   Sepsis Labs: _0 (procalcitonin:4,lacticidven:4) ) Recent Results (from the past 240 hour(s))  Resp  Panel by RT-PCR (Flu A&B, Covid) Anterior Nasal Swab     Status: None   Collection Time: 01/10/22  7:57 AM   Specimen: Anterior Nasal Swab  Result Value Ref Range Status   SARS Coronavirus 2 by RT PCR NEGATIVE NEGATIVE Final    Comment: (NOTE) SARS-CoV-2 target nucleic acids are NOT DETECTED.  The SARS-CoV-2 RNA is generally detectable in upper respiratory specimens during the acute phase of infection. The lowest concentration of SARS-CoV-2 viral copies this assay can detect is 138 copies/mL. A negative result does not preclude SARS-Cov-2 infection and should not be used as the sole basis for treatment or other patient management decisions. A negative result may occur with  improper specimen collection/handling, submission of specimen other than nasopharyngeal swab, presence of viral mutation(s) within the areas targeted by this assay, and inadequate number of viral copies(<138 copies/mL). A negative result must be combined with clinical observations, patient history, and epidemiological information. The expected result is Negative.  Fact Sheet for Patients:  EntrepreneurPulse.com.au  Fact Sheet for Healthcare Providers:  IncredibleEmployment.be  This test is no t yet approved or cleared by the [place] FDA and  has been authorized for detection and/or diagnosis of SARS-CoV-2 by FDA under an Emergency Use Authorization (EUA). This EUA will remain  in effect (meaning this test can be used) for the duration of the COVID-19 declaration under Section 564(b)(1) of the Act, 21 U.S.C.section 360bbb-3(b)(1), unless the authorization is terminated  or revoked sooner.       Influenza A by PCR NEGATIVE NEGATIVE Final   Influenza B by PCR NEGATIVE NEGATIVE Final    Comment: (NOTE) The Xpert Xpress  SARS-CoV-2/FLU/RSV plus assay is intended as an aid in the diagnosis of influenza from Nasopharyngeal swab specimens and should not be used as a sole basis for treatment. Nasal washings and aspirates are unacceptable for Xpert Xpress SARS-CoV-2/FLU/RSV testing.  Fact Sheet for Patients: EntrepreneurPulse.com.au  Fact Sheet for Healthcare Providers: IncredibleEmployment.be  This test is not yet approved or cleared by the [place] FDA and has been authorized for detection and/or diagnosis of SARS-CoV-2 by FDA under an Emergency Use Authorization (EUA). This EUA will remain in effect (meaning this test can be used) for the duration of the COVID-19 declaration under Section 564(b)(1) of the Act, 21 U.S.C. section 360bbb-3(b)(1), unless the authorization is terminated or revoked.  Performed at [facility], [address] [place], [place] [address]      Radiological Exams on Admission: CT HEAD WO CONTRAST (5MM)  Result Date: 01/10/2022 CLINICAL DATA:  Altered mental status for 3 days. EXAM: CT HEAD WITHOUT CONTRAST TECHNIQUE: Contiguous axial images were obtained from the base of the skull through the vertex without intravenous contrast. RADIATION DOSE REDUCTION: This exam was performed according to the departmental dose-optimization program which includes automated exposure control, adjustment of the mA and/or kV according to patient size and/or use of iterative reconstruction technique. COMPARISON:  09/22/2016 FINDINGS: Brain: No evidence of intracranial hemorrhage, acute infarction, hydrocephalus, extra-axial collection, or mass lesion/mass effect. Mild-to-moderate diffuse cerebral and cerebellar atrophy is unchanged in appearance. Moderate chronic small vessel disease and old bilateral basal ganglia lacunar infarcts are again demonstrated. Vascular:  No hyperdense vessel or other acute findings. Skull: No evidence of fracture or other  significant bone abnormality. Sinuses/Orbits:  No acute findings. Other: None. IMPRESSION: No acute intracranial abnormality. Stable cerebral and cerebellar atrophy, chronic small vessel disease, and old bilateral basal ganglia lacunar infarcts. Electronically Signed   By: Jenny Reichmann  Tereso Newcomer M.D.   On: 01/10/2022 09:05   DG Chest Port 1 View  Result Date: 01/10/2022 CLINICAL DATA:  Sepsis.  Altered mental status.  COPD. EXAM: PORTABLE CHEST 1 VIEW COMPARISON:  12/25/2016 FINDINGS: The heart size and mediastinal contours are within normal limits. Aortic atherosclerotic calcification incidentally noted. Low lung volumes are again noted. Both lungs are clear. IMPRESSION: No active disease. Electronically Signed   By: Marlaine Hind M.D.   On: 01/10/2022 08:21   DG Foot Complete Left  Result Date: 01/10/2022 CLINICAL DATA:  Cellulitis, concern for osteomyelitis EXAM: LEFT FOOT - COMPLETE 3+ VIEW COMPARISON:  None Available. FINDINGS: Hammertoe deformity of the great toe. Diffuse soft tissue swelling over the dorsum of the foot and the anterior lower leg. No conventional radiographic evidence of osteomyelitis visualized. No fracture or malalignment. The bones appear diffusely demineralized. IMPRESSION: No conventional radiographic evidence of osteomyelitis. Hammertoe deformity of the first and second toes. Electronically Signed   By: Jacqulynn Cadet M.D.   On: 01/10/2022 08:27      Assessment/Plan Principal Problem:   Cellulitis of left lower extremity Active Problems:   UTI (urinary tract infection)   Sepsis (HCC)   Acute metabolic encephalopathy   Chronic diastolic CHF (congestive heart failure) (HCC)   COPD (chronic obstructive pulmonary disease) (HCC)   Seizure disorder (HCC)   Chronic kidney disease, stage 3a (HCC)   Depression   Hypertension   Mixed hyperlipidemia   BPH (benign prostatic hyperplasia)   Normocytic anemia   Hypokalemia   Hypomagnesemia   Principal Problem:   Cellulitis of  left lower extremity Active Problems:   UTI (urinary tract infection)   Sepsis (HCC)   Acute metabolic encephalopathy   Chronic diastolic CHF (congestive heart failure) (HCC)   COPD (chronic obstructive pulmonary disease) (HCC)   Seizure disorder (HCC)   Chronic kidney disease, stage 3a (HCC)   Depression   Hypertension   Mixed hyperlipidemia   BPH (benign prostatic hyperplasia)   Normocytic anemia   Hypokalemia   Hypomagnesemia   Assessment and Plan: * Cellulitis of left lower extremity Pt has left lower extremity cellulitis in left foot and lower leg.  - will admit to tele bed as inpatient - Empiric antimicrobial treatment with vancomycin and cefepime (patient received 1 dose of Flagyl in ED) - Blood cultures x 2  - ESR and CRP - wound care consult - As needed Percocet and Tylenol for pain, Zofran for nausea - f/u LE doppler to r/o DVT   UTI (urinary tract infection) UA positive for UTI. Pt has hx of positive culture of VRE. -on cefepime -F/u urine culture  Sepsis ([place]) Sepsis due to UTI and cellulitis of left lower extremity: Meets criteria for sepsis with WBC 18.5, RR 22.  Lactic acid normal. -Antibiotics as above -Follow-up blood culture and urine culture -IV fluid: Patient received 500 cc normal saline (patient has elevated BNP 550, lipidemia aggressive IV fluid treatment)  Acute metabolic encephalopathy Possibly due to UTI.  CT head negative. -Frequent neurochecks  Chronic diastolic CHF (congestive heart failure) ([place]) 2D echo on 10/12/2018 showed EF> 55%.  Patient has bilateral leg edema. no worsening shortness of breath.  Oxygen saturation 99% on room air.  Patient does not seem to have acute CHF exacerbation.  His BNP is elevated 550, obviously pt is at risk of developing CHF exacerbation. -Continue home Lasix 40 mg daily -Will not give IV Lasix due to sepsis  COPD (chronic obstructive pulmonary disease) (HCC) Stable -Bronchodilators  Seizure disorder  ([place]) -Seizure precaution -As needed Ativan for seizure -Continue home phenobarbital  Chronic kidney disease, stage 3a ([employer]) Recent baseline creatinine 1.0-1.4.  His creatinine is 1.51, BUN 37, close to baseline. -Follow-up by BMP  Depression - Continue home medications  Hypertension - IV hydralazine as needed -Hold amlodipine due to sepsis and risk of developing hypotension -Coreg  Mixed hyperlipidemia - Pravastatin  BPH (benign prostatic hyperplasia) - Proscar  Normocytic anemia Hemoglobin stable, 10.7 today (10.1 on 04/11/2021) -Follow-up with CBC  Hypomagnesemia Mg 1.5 -repleted Mg  Hypokalemia K 2.2 and Mg 1.5 -repleated K and Mg              DVT ppx: SQ Lovenox  Code Status: Full code  Family Communication: I have tried to call contact person listed in [place], without success.  Could not leave a message since mailbox is full.  Disposition Plan:  Anticipate discharge back to previous environment, ALF  Consults called:  none  Admission status and Level of care: Telemetry Medical:    as inpt      Severity of Illness:  The appropriate patient status for this patient is INPATIENT. Inpatient status is judged to be reasonable and necessary in order to provide the required intensity of service to ensure the patient's safety. The patient's presenting symptoms, physical exam findings, and initial radiographic and laboratory data in the context of their chronic comorbidities is felt to place them at high risk for further clinical deterioration. Furthermore, it is not anticipated that the patient will be medically stable for discharge from the hospital within 2 midnights of admission.   * I certify that at the point of admission it is my clinical judgment that the patient will require inpatient hospital care spanning beyond 2 midnights from the point of admission due to high intensity of service, high risk for further deterioration and high frequency of  surveillance required.*       Date of Service 01/10/2022    Ivor Costa [facility]   If 7PM-7AM, please contact night-coverage [email or web address] 01/10/2022, 1:30 PM

## 2022-01-10 NOTE — Progress Notes (Signed)
Elink following for sepsis protocol. 

## 2022-01-10 NOTE — Assessment & Plan Note (Addendum)
Resolved. Mg 1.5>>2.2

## 2022-01-10 NOTE — Assessment & Plan Note (Signed)
-   Continue home medications 

## 2022-01-10 NOTE — Consult Note (Signed)
PHARMACY -  BRIEF ANTIBIOTIC NOTE   Pharmacy has received consult(s) for cefepime and vancomycin from an ED provider. Patient is also ordered metronidazole. The patient's profile has been reviewed for ht/wt/allergies/indication/available labs.   History reviewed; caution cefepime in seizure disorder   One time order(s) placed for  --Vancomycin 1 g IV --Cefepime 2 g IV  Further antibiotics/pharmacy consults should be ordered by admitting physician if indicated.                       Thank you, Benita Gutter 01/10/2022  9:16 AM

## 2022-01-11 DIAGNOSIS — L03116 Cellulitis of left lower limb: Secondary | ICD-10-CM | POA: Diagnosis not present

## 2022-01-11 LAB — BASIC METABOLIC PANEL
Anion gap: 4 — ABNORMAL LOW (ref 5–15)
BUN: 35 mg/dL — ABNORMAL HIGH (ref 8–23)
CO2: 24 mmol/L (ref 22–32)
Calcium: 7.2 mg/dL — ABNORMAL LOW (ref 8.9–10.3)
Chloride: 114 mmol/L — ABNORMAL HIGH (ref 98–111)
Creatinine, Ser: 1.54 mg/dL — ABNORMAL HIGH (ref 0.61–1.24)
GFR, Estimated: 46 mL/min — ABNORMAL LOW (ref >60)
Glucose, Bld: 106 mg/dL — ABNORMAL HIGH (ref 70–99)
Potassium: 2.4 mmol/L — CL (ref 3.5–5.1)
Sodium: 142 mmol/L (ref 135–145)

## 2022-01-11 LAB — BLOOD GAS, VENOUS
Acid-Base Excess: 0.1 mmol/L (ref 0.0–2.0)
Bicarbonate: 23.6 mmol/L (ref 20.0–28.0)
O2 Saturation: 99.9 %
Patient temperature: 37
pCO2, Ven: 34 mmHg — ABNORMAL LOW (ref 44–60)
pH, Ven: 7.45 — ABNORMAL HIGH (ref 7.25–7.43)
pO2, Ven: 171 mmHg — ABNORMAL HIGH (ref 32–45)

## 2022-01-11 LAB — CBC
HCT: 29.4 % — ABNORMAL LOW (ref 39.0–52.0)
Hemoglobin: 9.4 g/dL — ABNORMAL LOW (ref 13.0–17.0)
MCH: 27.1 pg (ref 26.0–34.0)
MCHC: 32 g/dL (ref 30.0–36.0)
MCV: 84.7 fL (ref 80.0–100.0)
Platelets: 253 10*3/uL (ref 150–400)
RBC: 3.47 MIL/uL — ABNORMAL LOW (ref 4.22–5.81)
RDW: 14.9 % (ref 11.5–15.5)
WBC: 15.4 10*3/uL — ABNORMAL HIGH (ref 4.0–10.5)
nRBC: 0 % (ref 0.0–0.2)

## 2022-01-11 LAB — POTASSIUM: Potassium: 3.2 mmol/L — ABNORMAL LOW (ref 3.5–5.1)

## 2022-01-11 LAB — URINE CULTURE

## 2022-01-11 LAB — VANCOMYCIN, RANDOM: Vancomycin Rm: 10 ug/mL

## 2022-01-11 LAB — MAGNESIUM: Magnesium: 2.2 mg/dL (ref 1.7–2.4)

## 2022-01-11 LAB — TROPONIN I (HIGH SENSITIVITY): Troponin I (High Sensitivity): 40 ng/L — ABNORMAL HIGH (ref <18)

## 2022-01-11 LAB — GLUCOSE, CAPILLARY: Glucose-Capillary: 133 mg/dL — ABNORMAL HIGH (ref 70–99)

## 2022-01-11 MED ORDER — SODIUM CHLORIDE 0.9 % IV BOLUS
500.0000 mL | Freq: Once | INTRAVENOUS | Status: AC
  Administered 2022-01-11: 500 mL via INTRAVENOUS

## 2022-01-11 MED ORDER — POTASSIUM CHLORIDE 10 MEQ/100ML IV SOLN
10.0000 meq | INTRAVENOUS | Status: AC
  Administered 2022-01-11 (×4): 10 meq via INTRAVENOUS
  Filled 2022-01-11 (×4): qty 100

## 2022-01-11 MED ORDER — MIDODRINE HCL 5 MG PO TABS
10.0000 mg | ORAL_TABLET | Freq: Two times a day (BID) | ORAL | Status: DC
  Administered 2022-01-11 – 2022-01-13 (×5): 10 mg via ORAL
  Filled 2022-01-11 (×5): qty 2

## 2022-01-11 MED ORDER — NALOXONE HCL 0.4 MG/ML IJ SOLN
0.4000 mg | INTRAMUSCULAR | Status: DC | PRN
  Administered 2022-01-11: 0.4 mg via INTRAVENOUS
  Filled 2022-01-11: qty 1

## 2022-01-11 MED ORDER — METHYLPREDNISOLONE SODIUM SUCC 125 MG IJ SOLR
125.0000 mg | Freq: Two times a day (BID) | INTRAMUSCULAR | Status: DC
  Administered 2022-01-11 – 2022-01-12 (×3): 125 mg via INTRAVENOUS
  Filled 2022-01-11 (×4): qty 2

## 2022-01-11 MED ORDER — POTASSIUM CHLORIDE CRYS ER 20 MEQ PO TBCR
40.0000 meq | EXTENDED_RELEASE_TABLET | Freq: Once | ORAL | Status: AC
  Administered 2022-01-11: 40 meq via ORAL
  Filled 2022-01-11: qty 2

## 2022-01-11 MED ORDER — VANCOMYCIN HCL IN DEXTROSE 1-5 GM/200ML-% IV SOLN
1000.0000 mg | INTRAVENOUS | Status: DC
  Administered 2022-01-11 – 2022-01-14 (×4): 1000 mg via INTRAVENOUS
  Filled 2022-01-11 (×5): qty 200

## 2022-01-11 MED ORDER — CHLORHEXIDINE GLUCONATE CLOTH 2 % EX PADS
6.0000 | MEDICATED_PAD | Freq: Every day | CUTANEOUS | Status: DC
  Administered 2022-01-11 – 2022-01-15 (×5): 6 via TOPICAL

## 2022-01-11 MED ORDER — SODIUM CHLORIDE 0.9 % IV BOLUS
250.0000 mL | Freq: Once | INTRAVENOUS | Status: AC
  Administered 2022-01-11: 250 mL via INTRAVENOUS

## 2022-01-11 NOTE — Hospital Course (Addendum)
Taken from H&P.   [name] is a 81 y.o. 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 Patient was unable to provide meaningful history and admitting provider was unable to reach family despite multiple attempts. During admission patient is confused, knows his own name, knows that he is in hospital, but is confused about time.  He moves all extremities.  No facial droop or slurred speech.  He has pressure ulcers in both heels.  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  The left lower leg and foot are erythematous and tender.  Data Reviewed and ED Course: pt was found to have WBC 18.5, BMP 550, lactic acid 0.9, INR 1.3, positive urinalysis (cloudy appearance, moderate amount of leukocyte, many bacteria, WBC> 50), negative COVID PCR, CK level 88, renal function close to baseline, potassium 2.2, temperature 99.7, blood pressure 132/74, RR 22, HR 83, oxygen saturation 99% on room air.  Chest x-ray negative.  VBG with pH 7.43, CO2 43. CT head negative for acute intracranial abnormalities.  Left foot x-rays showed no evidence of osteomyelitis  EKG: I have personally reviewed.  Sinus rhythm, PVC, QTc 388, nonspecific T wave change.  Patient met sepsis criteria with fever of 100.8, leukocytosis at 18.5, tachypnea.  UA look infected, history of positive urine cultures of VRE.  Blood and urine cultures pending. Also concern of left lower extremity cellulitis.  He was started on cefepime and vancomycin.  6/5: Procalcitonin elevated at 1.09, troponin at 40, BNP 530, elevated ESR at 111 and CRP 19.4 Slight decrease in leukocytosis to 15.4 but all cell lines decreased.  Received about 1 L of  fluid. Significant hypokalemia with normal magnesium. Prevana boot were placed.  Urine culture with multiple species.  Blood cultures remain negative.  6/6: Blood cultures remain negative and urine cultures with multiple species. Still significant lower extremity edema requiring IV Lasix.  Good urinary output.  Renal functions with slight improvement.

## 2022-01-11 NOTE — Progress Notes (Signed)
Progress Note   Patient: [name] DXA:[id number] DOB: February 19, 1941 DOA: 01/10/2022     1 DOS: the patient was seen and examined on 01/11/2022   Brief hospital course: Taken from H&P.   [name] is a 81 y.o. 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 Patient was unable to provide meaningful history and admitting provider was unable to reach family despite multiple attempts. During admission patient is confused, knows his own name, knows that he is in hospital, but is confused about time.  He moves all extremities.  No facial droop or slurred speech.  He has pressure ulcers in both heels.  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  The left lower leg and foot are erythematous and tender.  Data Reviewed and ED Course: pt was found to have WBC 18.5, BMP 550, lactic acid 0.9, INR 1.3, positive urinalysis (cloudy appearance, moderate amount of leukocyte, many bacteria, WBC> 50), negative COVID PCR, CK level 88, renal function close to baseline, potassium 2.2, temperature 99.7, blood pressure 132/74, RR 22, HR 83, oxygen saturation 99% on room air.  Chest x-ray negative.  VBG with pH 7.43, CO2 43. CT head negative for acute intracranial abnormalities.  Left foot x-rays showed no evidence of osteomyelitis  EKG: I have personally reviewed.  Sinus rhythm, PVC, QTc 388, nonspecific T wave change.  Patient met sepsis criteria with fever of 100.8, leukocytosis at 18.5, tachypnea.  UA look infected, history of positive urine cultures of VRE.  Blood and urine cultures pending. Also concern of left lower extremity cellulitis.  He was started on cefepime and vancomycin.  6/5:  Procalcitonin elevated at 1.09, troponin at 40, BNP 530, elevated ESR at 111 and CRP 19.4 Slight decrease in leukocytosis to 15.4 but all cell lines decreased.  Received about 1 L of fluid. Significant hypokalemia with normal magnesium. Prevana boot were placed.  Urine culture with multiple species.  Blood cultures remain negative.    Assessment and Plan: * Cellulitis of left lower extremity Pt has left lower extremity cellulitis in left foot and lower leg.  - Empiric antimicrobial treatment with vancomycin and cefepime (patient received 1 dose of Flagyl in ED) - Blood cultures x 2 -negative so far - ESR and CRP-significantly elevated with ESR of 111 and CRP of 19.4 - wound care consult - As needed Percocet and Tylenol for pain, Zofran for nausea    UTI (urinary tract infection) UA positive for UTI. Pt has hx of positive culture of VRE.  Urine cultures with multiple species. -on cefepime   Sepsis ([place]) Sepsis due to UTI and cellulitis of left lower extremity: Meets criteria for sepsis with WBC 18.5, RR 22.  Lactic acid normal. -Antibiotics as above -Urine culture with multiple species and blood cultures negative so far -IV fluid: Patient received 500 cc normal saline (patient has elevated BNP 550, lipidemia aggressive IV fluid treatment)  Acute metabolic encephalopathy Possibly due to UTI.  CT head negative. -Frequent neurochecks  Chronic diastolic CHF (congestive heart failure) ([place]) 2D echo on 10/12/2018 showed EF> 55%.  Patient has bilateral leg edema. no worsening shortness of breath.  Oxygen saturation 99% on room air.  Patient does not seem to have acute CHF exacerbation.  His BNP  is elevated 550, obviously pt is at risk of developing CHF exacerbation. -Continue home Lasix 40 mg daily -Will not give IV Lasix due to sepsis  COPD (chronic obstructive pulmonary disease) ([facility]) Stable -Bronchodilators  Seizure disorder ([facility]) -Seizure precaution -As needed Ativan for  seizure -Continue home phenobarbital  Chronic kidney disease, stage 3a ([facility]) Recent baseline creatinine 1.0-1.4.  His creatinine is 1.51, BUN 37, close to baseline. -Follow-up by BMP  Depression - Continue home medications  Hypertension - IV hydralazine as needed -Hold amlodipine due to sepsis and risk of developing hypotension -Coreg  Mixed hyperlipidemia - Pravastatin  BPH (benign prostatic hyperplasia) - Proscar  Normocytic anemia Hemoglobin stable, 10.7 today (10.1 on 04/11/2021) -Follow-up with CBC  Hypomagnesemia Resolved. Mg 1.5>>2.2   Hypokalemia K 2.2>>3.2 and Mg 1.5>>2.2 -repleated K and Mg as needed       Subjective: Patient was seen and examined today.  Had watery stools, denies any abdominal pain.  Continues to have left leg pain.  Physical Exam: Vitals:   01/11/22 0447 01/11/22 0729 01/11/22 1223 01/11/22 1532  BP: 114/61 125/62 113/62 (!) 106/57  Pulse: 64 66 70 70  Resp: _0 Temp: 97.8 F (36.6 C) 97.7 F (36.5 C) 97.6 F (36.4 C) 98 F (36.7 C)  TempSrc: Axillary Axillary    SpO2: 98% 100% 100% 100%  Weight:      Height:       General.  Chronically ill-appearing, obese gentleman, in no acute distress. Pulmonary.  Lungs clear bilaterally, normal respiratory effort. CV.  Regular rate and rhythm, positive murmur Abdomen.  Soft, nontender, nondistended, BS positive. CNS.  Alert and oriented .  No focal neurologic deficit. Extremities.  Left leg edema and erythema Psychiatry.  Judgment and insight appears impaired  Data Reviewed: Prior notes and labs reviewed  Family Communication:   Disposition: Status is: Inpatient Remains inpatient appropriate because: Severity of illness   Planned Discharge Destination: To be determined  Time spent: 50 minutes  This record has been created using Systems analyst. Errors have been sought and corrected,but may not always be located. Such creation errors do not reflect  on the standard of care.  Author: Lorella Nimrod, MD 01/11/2022 5:37 PM  For on call review [email or web address].

## 2022-01-11 NOTE — Consult Note (Addendum)
WOC Nurse Consult Note: Reason for Consult: Consult requested for BLE.  Performed remotely after review of progress notes and photos in the EMR. Pt has dark red-purple Deep tissue pressure injuries (DTPI) to bilat heels.  Left heel with loose peeling skin to wound edges, right heel with intact skin. DTPI are high risk to evolve into full thickness skin loss within 7-10 days.  Left anterior great toe with dark red full thickness wound Left posterior leg with red moist full thickness wound.  Bilat legs with generalized edema.  Pressure Injury POA: Yes Dressing procedure/placement/frequency: Topical treatment orders provided for bedside nurses to perform as follows to promote healing: Apply Xeroform gauze to left great toe, left posterior leg, and bilat heel wounds Q day, then cover with foam dressings.  (Change foam dressings Q 3 days or PRN soiling.) Float bilat heels using Prevalon boots to reduce pressure. Please re-consult if further assistance is needed.  Thank-you,  Cammie Mcgee MSN, RN, CWOCN, [place], CNS [phone]

## 2022-01-11 NOTE — Progress Notes (Signed)
Verbal order to d/c tele, 40 mEq X1 Klor Con, order placed.

## 2022-01-11 NOTE — Progress Notes (Signed)
Upon rounds, patient diaphoretic and pale. VSS obtained and bp 94/41 HR in the 60s. Pt unable to respond to anything except opening eyes with sternal rub. On call DO notified. IV Bolus ordered and started. BP got down to 89/36 and patient was still not responding to anything but pain. DO made aware of patient status and red mews. Rapid RN also made aware. EKG obtained, solumedrol and narcan given, and labs obtained. Critical K of 2.4 was reported to DO on call. Potassium replacements currently running. BP now up to 109/56. Patient now opening eyes and more responsive able to report name. Patient was also assessed at bedside by Rapid RN and NP. Patient now a yellow mews.    01/11/22 0328  Assess: MEWS Score  BP (!) 109/56  MAP (mmHg) 73  Level of Consciousness Responds to Voice  Assess: MEWS Score  MEWS Temp 0  MEWS Systolic 0  MEWS Pulse 0  MEWS RR 1  MEWS LOC 1  MEWS Score 2  MEWS Score Color Yellow  Assess: if the MEWS score is Yellow or Red  Were vital signs taken at a resting state? Yes  Focused Assessment Change from prior assessment (see assessment flowsheet)  Does the patient meet 2 or more of the SIRS criteria? No  MEWS guidelines implemented *See Row Information* No, previously red, continue vital signs every 4 hours  Treat  MEWS Interventions Administered scheduled meds/treatments;Administered prn meds/treatments;Escalated (See documentation below)  Escalate  MEWS: Escalate Yellow: discuss with charge nurse/RN and consider discussing with provider and RRT  Notify: Charge Nurse/RN  Name of Charge Nurse/RN Notified Rosey Bath, RN  Notify: Provider  Provider Name/Title Zierle-Ghosh, DO  Document  Patient Outcome Stabilized after interventions

## 2022-01-11 NOTE — Consult Note (Signed)
Pharmacy Antibiotic Note  [name] is a 81 y.o. male with medical history including C diff, VRE, urinary retention with chronic indwelling foley, CHF, COPD admitted on 01/10/2022 with  left leg cellulitis and UTI .  Pharmacy has been consulted for cefepime and vancomycin dosing.  Plan 01/11/22:  Continue Cefepime 2 g IV q12h  Initiate Vancomycin maintenance regimen of 1000 mg Q24H. Goal AUC 400-550 Expected AUC: 456 Scr used 1.54; IBW; Vd 0.5 (BMI 36.6)   Height: 5\' 10"  (177.8 cm) Weight: 116.1 kg (256 lb) IBW/kg (Calculated) : 73  Temp (24hrs), Avg:97.7 F (36.5 C), Min:97.3 F (36.3 C), Max:98 F (36.7 C)  Recent Labs  Lab 01/10/22 0757 01/10/22 1914 01/11/22 0158 01/11/22 1605  WBC 18.5*  --  15.4*  --   CREATININE 1.51*  --  1.54*  --   LATICACIDVEN 0.9 1.1  --   --   VANCORANDOM  --   --   --  10     Estimated Creatinine Clearance: 49.6 mL/min (A) (by C-G formula based on SCr of 1.54 mg/dL (H)).    Allergies  Allergen Reactions   Penicillin G Rash and Other (See Comments)    Convulsions.    Has patient had a PCN reaction causing immediate rash, facial/tongue/throat swelling, SOB or lightheadedness with hypotension: not sure Has patient had a PCN reaction causing severe rash involving mucus membranes or skin necrosis: not sure Has patient had a PCN reaction that required hospitalization: not sure Has patient had a PCN reaction occurring within the last 10 years: not sure If all of the above answers are "NO", then may proceed with Cephalosporin use.    Antimicrobials this admission: Metronidazole 6/4 x 1 Cefepime 6/4 >>  Vancomycin 6/4 >>   Dose adjustments this admission: N/A  Microbiology results: 6/4 BCx: pending 6/4 UCx: multiple species 6/4 MRSA PCR: neg  Thank you for allowing pharmacy to be a part of this patient's care.  8/4, PharmD, BCPS Clinical Pharmacist   01/11/2022 4:57 PM

## 2022-01-12 DIAGNOSIS — R1310 Dysphagia, unspecified: Secondary | ICD-10-CM

## 2022-01-12 DIAGNOSIS — L03116 Cellulitis of left lower limb: Secondary | ICD-10-CM | POA: Diagnosis not present

## 2022-01-12 LAB — BASIC METABOLIC PANEL
Anion gap: 6 (ref 5–15)
BUN: 38 mg/dL — ABNORMAL HIGH (ref 8–23)
CO2: 22 mmol/L (ref 22–32)
Calcium: 7.9 mg/dL — ABNORMAL LOW (ref 8.9–10.3)
Chloride: 110 mmol/L (ref 98–111)
Creatinine, Ser: 1.41 mg/dL — ABNORMAL HIGH (ref 0.61–1.24)
GFR, Estimated: 51 mL/min — ABNORMAL LOW (ref >60)
Glucose, Bld: 212 mg/dL — ABNORMAL HIGH (ref 70–99)
Potassium: 3.5 mmol/L (ref 3.5–5.1)
Sodium: 138 mmol/L (ref 135–145)

## 2022-01-12 LAB — GLUCOSE, CAPILLARY
Glucose-Capillary: 253 mg/dL — ABNORMAL HIGH (ref 70–99)
Glucose-Capillary: 252 mg/dL — ABNORMAL HIGH (ref 70–99)
Glucose-Capillary: 236 mg/dL — ABNORMAL HIGH (ref 70–99)
Glucose-Capillary: 211 mg/dL — ABNORMAL HIGH (ref 70–99)

## 2022-01-12 LAB — C DIFFICILE QUICK SCREEN W PCR REFLEX
C Diff antigen: NEGATIVE
C Diff interpretation: NOT DETECTED
C Diff toxin: NEGATIVE

## 2022-01-12 LAB — HEMOGLOBIN A1C
Hgb A1c MFr Bld: 5.8 % — ABNORMAL HIGH (ref 4.8–5.6)
Mean Plasma Glucose: 119.76 mg/dL

## 2022-01-12 MED ORDER — INSULIN ASPART 100 UNIT/ML IJ SOLN
0.0000 [IU] | Freq: Every day | INTRAMUSCULAR | Status: DC
  Administered 2022-01-12: 2 [IU] via SUBCUTANEOUS
  Filled 2022-01-12: qty 1

## 2022-01-12 MED ORDER — INSULIN ASPART 100 UNIT/ML IJ SOLN
0.0000 [IU] | Freq: Three times a day (TID) | INTRAMUSCULAR | Status: DC
  Administered 2022-01-12: 3 [IU] via SUBCUTANEOUS
  Administered 2022-01-13: 2 [IU] via SUBCUTANEOUS
  Administered 2022-01-13 – 2022-01-15 (×6): 1 [IU] via SUBCUTANEOUS
  Filled 2022-01-12 (×8): qty 1

## 2022-01-12 MED ORDER — ZINC SULFATE 220 (50 ZN) MG PO CAPS
220.0000 mg | ORAL_CAPSULE | Freq: Every day | ORAL | Status: DC
  Administered 2022-01-13 – 2022-01-16 (×4): 220 mg via ORAL
  Filled 2022-01-12 (×4): qty 1

## 2022-01-12 MED ORDER — ASCORBIC ACID 500 MG PO TABS
500.0000 mg | ORAL_TABLET | Freq: Two times a day (BID) | ORAL | Status: DC
  Administered 2022-01-12 – 2022-01-16 (×8): 500 mg via ORAL
  Filled 2022-01-12 (×8): qty 1

## 2022-01-12 MED ORDER — POTASSIUM CHLORIDE CRYS ER 20 MEQ PO TBCR
40.0000 meq | EXTENDED_RELEASE_TABLET | Freq: Once | ORAL | Status: AC
  Administered 2022-01-12: 40 meq via ORAL
  Filled 2022-01-12: qty 2

## 2022-01-12 MED ORDER — NEPRO/CARBSTEADY PO LIQD
237.0000 mL | Freq: Two times a day (BID) | ORAL | Status: DC
  Administered 2022-01-13 (×2): 237 mL via ORAL

## 2022-01-12 NOTE — Progress Notes (Signed)
Progress Note   Patient: [name] JAS:[id number] DOB: 12/23/1940 DOA: 01/10/2022     2 DOS: the patient was seen and examined on 01/12/2022   Brief hospital course: Taken from H&P.   [name] is a 81 y.o. 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 Patient was unable to provide meaningful history and admitting provider was unable to reach family despite multiple attempts. During admission patient is confused, knows his own name, knows that he is in hospital, but is confused about time.  He moves all extremities.  No facial droop or slurred speech.  He has pressure ulcers in both heels.  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  The left lower leg and foot are erythematous and tender.  Data Reviewed and ED Course: pt was found to have WBC 18.5, BMP 550, lactic acid 0.9, INR 1.3, positive urinalysis (cloudy appearance, moderate amount of leukocyte, many bacteria, WBC> 50), negative COVID PCR, CK level 88, renal function close to baseline, potassium 2.2, temperature 99.7, blood pressure 132/74, RR 22, HR 83, oxygen saturation 99% on room air.  Chest x-ray negative.  VBG with pH 7.43, CO2 43. CT head negative for acute intracranial abnormalities.  Left foot x-rays showed no evidence of osteomyelitis  EKG: I have personally reviewed.  Sinus rhythm, PVC, QTc 388, nonspecific T wave change.  Patient met sepsis criteria with fever of 100.8, leukocytosis at 18.5, tachypnea.  UA look infected, history of positive urine cultures of VRE.  Blood and urine cultures pending. Also concern of left lower extremity cellulitis.  He was started on cefepime and vancomycin.  6/5:  Procalcitonin elevated at 1.09, troponin at 40, BNP 530, elevated ESR at 111 and CRP 19.4 Slight decrease in leukocytosis to 15.4 but all cell lines decreased.  Received about 1 L of fluid. Significant hypokalemia with normal magnesium. Prevana boot were placed.  Urine culture with multiple species.  Blood cultures remain negative.  6/6: Blood cultures remain negative and urine cultures with multiple species. Still significant lower extremity edema requiring IV Lasix.  Good urinary output.  Renal functions with slight improvement.    Assessment and Plan: * Cellulitis of left lower extremity Pt has left lower extremity cellulitis in left foot and lower leg.  - Empiric antimicrobial treatment with vancomycin and cefepime (patient received 1 dose of Flagyl in ED) - Blood cultures x 2 -negative so far - ESR and CRP-significantly elevated with ESR of 111 and CRP of 19.4 - wound care consult-appreciate their recommendations - As needed Percocet and Tylenol for pain, Zofran for nausea    UTI (urinary tract infection) UA positive for UTI. Pt has hx of positive culture of VRE.  Urine cultures with multiple species. -on cefepime   Sepsis ([name]) Sepsis due to UTI and cellulitis of left lower extremity: Meets criteria for sepsis with WBC 18.5, RR 22.  Lactic acid normal. -Antibiotics as above -Urine culture with multiple species and blood cultures negative so far -IV fluid: Patient received 500 cc normal saline (patient has elevated BNP 550, lipidemia aggressive IV fluid treatment)  Acute metabolic encephalopathy Possibly due to UTI.  CT head negative. -Frequent neurochecks  Chronic diastolic CHF (congestive heart failure) ([name]) 2D echo on 10/12/2018 showed EF>  55%.  Patient has bilateral leg edema. no worsening shortness of breath.  Oxygen saturation 99% on room air.  Patient does not seem to have acute CHF exacerbation.  His BNP is elevated 550, obviously pt is at risk of developing CHF  exacerbation. -Continue home Lasix 40 mg daily -Will not give IV Lasix due to sepsis  COPD (chronic obstructive pulmonary disease) ([name]) Stable -Bronchodilators  Seizure disorder ([name]) -Seizure precaution -As needed Ativan for seizure -Continue home phenobarbital  Chronic kidney disease, stage 3a ([name]) Recent baseline creatinine 1.0-1.4.  His creatinine is 1.51, BUN 37, close to baseline. -Follow-up by BMP  Depression - Continue home medications  Hypertension - IV hydralazine as needed -Hold amlodipine due to sepsis and risk of developing hypotension -Coreg  Mixed hyperlipidemia - Pravastatin  BPH (benign prostatic hyperplasia) - Proscar  Normocytic anemia Hemoglobin stable, 10.7 today (10.1 on 04/11/2021) -Follow-up with CBC  Hypomagnesemia Resolved. Mg 1.5>>2.2   Hypokalemia Resolved.  Potassium at 3.5 today -Continue to monitor and replete as needed-patient is being diuresed    Dysphagia Swallow evaluation was obtained with nursing concern of coughing with food.  They are recommending nectar thick and dysphagia diet. -Continue with dysphagia diet with nectar thick liquid as recommended.     Subjective: Patient was feeling little improved when seen today.  Did not had any more bowel movement since morning.  Denies any pain.  Physical Exam: Vitals:   01/12/22 0254 01/12/22 0432 01/12/22 0739 01/12/22 1206  BP: (!) 96/46 (!) 102/48 (!) 120/51 132/68  Pulse: (!) 58  64 64  Resp: $Remo'19  17 17  'IjHAa$ Temp: 97.6 F (36.4 C)  97.9 F (36.6 C) 98 F (36.7 C)  TempSrc: Axillary     SpO2: 97%  99% 100%  Weight:      Height:       General.  Obese gentleman, in no acute distress. Pulmonary.  Lungs clear bilaterally, normal respiratory effort. CV.  Regular rate and rhythm, no JVD, rub or murmur. Abdomen.  Soft, nontender, nondistended, BS positive. CNS.  Alert and oriented .  No focal neurologic deficit. Extremities.  2+ LE edema with some left leg  erythema Psychiatry.  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 love listed in his chart with no response  Disposition: Status is: Inpatient Remains inpatient appropriate because: Severity of illness   Planned Discharge Destination: Home with Home Health  Time spent: 45 minutes  This record has been created using Systems analyst. Errors have been sought and corrected,but may not always be located. Such creation errors do not reflect on the standard of care.  Author: Lorella Nimrod, MD 01/12/2022 4:41 PM  For on call review [email or web address].

## 2022-01-12 NOTE — Evaluation (Signed)
Occupational Therapy Evaluation Patient Details Name: [name] MRN: [id number] DOB: 04-25-1941 Today's Date: 01/12/2022   History of Present Illness [name] is a 57yoM who comes to [facility] on 01/10/22 from [place] with 3d AMS. Current Left toe and calf wounds. PMH: HTN, HLD, COPD, GERD, depression BPD, schizophrenia, seizure d/o, DVT not on AC, dCHF, C diff, BPH, CKD3a. Pt admitted with LLE cellulitis, also (+) UTI. Per 2017 PT notes baseline mobility includes WC at ALF and physical assist for transfers, modA+1 for bed mobility and transfers.   Clinical Impression   Pt was seen for OT evaluation this date. Prior to hospital admission, pt was requiring significant assist for all aspects of mobility, dressing, toileting, and bathing from staff at ALF. Pt is wheelchair bound at baseline. He reports he is able to self feed and complete grooming tasks without assist. Currently pt demonstrates impairments as described below (See OT problem list) which functionally limit his ability to perform ADL/self-care tasks. Pt currently requires set up and supervision for self feeding and noted to cough/clear his throat/choke twice. Pt educated in aspiration risk and strategies to minimize risk. Pt verbalized understanding and was able to return demo with intermittent VC to utilize. Pt also requires MAX A - TOTAL A for rolling side to side in bed. Anticipate +2 heavy assist for sup<>sit and unsafe for transfer attempts at this time. Pt would benefit from skilled OT services to address noted impairments and functional limitations (see below for any additional details) in order to maximize safety and independence while minimizing falls risk and caregiver burden. Upon hospital discharge, recommend STR to maximize pt safety and return to PLOF.    Recommendations for follow up therapy are one component of a multi-disciplinary discharge planning process, led by the attending physician.  Recommendations may be  updated based on patient status, additional functional criteria and insurance authorization.   Follow Up Recommendations  Skilled nursing-short term rehab (<3 hours/day)    Assistance Recommended at Discharge Frequent or constant Supervision/Assistance  Patient can return home with the following Two people to help with bathing/dressing/bathroom;Two people to help with walking and/or transfers;Assistance with cooking/housework;Assist for transportation;Direct supervision/assist for medications management;Help with stairs or ramp for entrance;Assistance with feeding    Functional Status Assessment  Patient has had a recent decline in their functional status and demonstrates the ability to make significant improvements in function in a reasonable and predictable amount of time.  Equipment Recommendations  Other (comment) (defer to next venue)    Recommendations for Other Services       Precautions / Restrictions Precautions Precautions: Fall Restrictions Weight Bearing Restrictions: No      Mobility Bed Mobility               General bed mobility comments: MAX A - TOTAL A for rolling side to side, all other mobility deferred    Transfers                          Balance                                           ADL either performed or assessed with clinical judgement   ADL Overall ADL's : Needs assistance/impaired  General ADL Comments: Pt requires set up and supervision for self feeding, noted to choke/cough/clear his throat twice. Instructed in aspiration risk and how to minimize risk and pt able to return demo with intermittent VC to utilize learned strategies. Pt requires MAX A for all aspects of bathing, dressing, and toileting from bed level at this time.     Vision         Perception     Praxis      Pertinent Vitals/Pain Pain Assessment Pain Assessment: 0-10 Pain Score:  4  Pain Location: L toes Pain Descriptors / Indicators: Aching Pain Intervention(s): Limited activity within patient's tolerance, Monitored during session     Hand Dominance Right   Extremity/Trunk Assessment Upper Extremity Assessment Upper Extremity Assessment: Generalized weakness   Lower Extremity Assessment Lower Extremity Assessment: Generalized weakness;LLE deficits/detail LLE Deficits / Details: toe pain       Communication     Cognition Arousal/Alertness: Awake/alert Behavior During Therapy: WFL for tasks assessed/performed Overall Cognitive Status: No family/caregiver present to determine baseline cognitive functioning                                 General Comments: able to follow simple commands     General Comments       Exercises     Shoulder Instructions      Home Living Family/patient expects to be discharged to:: Assisted living                             Home Equipment: Wheelchair - manual;Hospital bed          Prior Functioning/Environment Prior Level of Function : Needs assist             Mobility Comments: Pt requires assist from staff for all bed mobility and transfers in/out of wheelchair ADLs Comments: Per pt report he is able to complete self feeding and grooming himself but requires significant assist from staff for all other ADL and all IADL        OT Problem List: Decreased strength;Decreased activity tolerance;Decreased safety awareness;Pain;Impaired balance (sitting and/or standing);Decreased knowledge of use of DME or AE;Obesity      OT Treatment/Interventions: Self-care/ADL training;Therapeutic exercise;Therapeutic activities;DME and/or AE instruction;Patient/family education;Balance training    OT Goals(Current goals can be found in the care plan section) Acute Rehab OT Goals Patient Stated Goal: get better OT Goal Formulation: With patient Time For Goal Achievement: 01/26/22 Potential to  Achieve Goals: Good ADL Goals Pt Will Perform Eating: with modified independence;with set-up;sitting Pt Will Perform Grooming: with set-up;sitting Additional ADL Goal #1: Pt will complete bed mobility for bed level ADL requiring MOD A.  OT Frequency: Min 1X/week    Co-evaluation              AM-PAC OT "6 Clicks" Daily Activity     Outcome Measure Help from another person eating meals?: A Little Help from another person taking care of personal grooming?: A Little Help from another person toileting, which includes using toliet, bedpan, or urinal?: Total Help from another person bathing (including washing, rinsing, drying)?: A Lot Help from another person to put on and taking off regular upper body clothing?: A Lot Help from another person to put on and taking off regular lower body clothing?: A Lot 6 Click Score: 13   End of Session Nurse Communication: Other (comment) (aspiration risk)  Activity Tolerance: Patient tolerated treatment well Patient left: in bed;with call bell/phone within reach;with bed alarm set  OT Visit Diagnosis: Other abnormalities of gait and mobility (R26.89);Muscle weakness (generalized) (M62.81);Pain Pain - Right/Left: Left Pain - part of body: Ankle and joints of foot                Time: 0950-1013 OT Time Calculation (min): 23 min Charges:  OT General Charges $OT Visit: 1 Visit OT Evaluation $OT Eval Moderate Complexity: 1 Mod OT Treatments $Self Care/Home Management : 8-22 mins  Ardeth Perfect., MPH, MS, OTR/L ascom [phone] 01/12/22, 10:30 AM

## 2022-01-12 NOTE — Progress Notes (Addendum)
Met with the patient at the bedside He lives [facility] He stated that he is independent generally He is agreeable to Home health PT He needs a rolling walker, [employer] will deliver to the bedside prior to DC I called [facility] [phone] and requested to speak to Shirlee Limerick who is in charge of the facility [facility] transport Call [place] the administrator He will pick up the patient at [facility] patient was [facility]

## 2022-01-12 NOTE — Evaluation (Signed)
Clinical/Bedside Swallow Evaluation Patient Details  Name: [name] MRN: [id number] Date of Birth: 21-Jun-1941  Today's Date: 01/12/2022 Time: SLP Start Time (ACUTE ONLY): 1200 SLP Stop Time (ACUTE ONLY): 1300 SLP Time Calculation (min) (ACUTE ONLY): 60 min  Past Medical History:  Past Medical History:  Diagnosis Date   Bipolar 1 disorder ([place])    C. difficile diarrhea 05/2016   history of ...   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   Schizophrenia ([place])    Seizure ([place])    on phenobarb   Urinary retention    frequently requiring foley placement   VRE (vancomycin resistant enterococcus) culture positive    urine   Past Surgical History:  Past Surgical History:  Procedure Laterality Date   EXTRACORPOREAL SHOCK WAVE LITHOTRIPSY Right 05/05/2017   Procedure: EXTRACORPOREAL SHOCK WAVE LITHOTRIPSY (ESWL);  Surgeon: Hollice Espy, MD;  Location: [facility] ORS;  Service: Urology;  Laterality: Right;   IR CATHETER TUBE CHANGE  06/23/2017   IR CATHETER TUBE CHANGE  06/28/2017   HPI:  Pt is a 81 y.o. 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     Patient has altered mental status, cannot provide accurate medical history.  I have tried to call his family without success.       Per report, patient has been confused in the past several days.  When I  saw patient in the emergency room, patient is confused, knows his own name, knows that he is in hospital, but is confused about time.  He moves all extremities.  No facial droop or slurred speech.  He has pressure ulcers in both heels.  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  The left lower leg and foot are erythematous and tender.  His body temperature 99.7.  Patient does not have active respiratory distress, cough, nausea, vomiting and diarrhea.  Not sure if patient has symptoms of UTI.   CXR at admit: No active disease.    Assessment / Plan / Recommendation  Clinical Impression  Pt appears to present w/ oropharyngeal phase dysphagia w/ suspected Neuromuscular deficits -- possible delay in pharyngeal swallow timing. Also noted impact from having No Bottom Dentition (lower Denture plate at "home" per pt report; wearing only the Upper Denture plate) on mastication of soft solids; increased Time needed for full chewing/mashing of foods b/f swallowing. Noted all motor movements and verbal responses were slightly slow -- unsure if this is pt's normal presentation in setting of his medical and pschiatric dxs, medications. Pt required min+ tactile/verbal/visual cues for follow through w/ tasks. Unsure of pt's full Cognitive status/function premorbid.   Pt consumed trials of single ice chips, thin and Nectar liquids via Cup, purees, and moistened solids given setup. Immediate, overt clinical s/s of aspiration noted w/ trials of thin liquids(coughing) as was noted by OT and NSG  staff in recent 1-2 days. W/ trials of Nectar liquids and foods, no cough and no decline in respiratory presentation noted during/post trials. Clear vocal quality noted b/t trials. Oral phase was c/b min slow, deliberate bolus management and oral clearing of all boluses given. He required increased Time for mastication d/t No bottom Dentition. A-P transfer was adequate w/ liquids and purees. Oral clearing achieved w/  consistencies given time. OM exam was grossly [facility] w/ no lingual weakness noted; no labial/facial asymmetry. Pt fed self w/ setup support.   In setting of increased risk for aspiration/aspiration pneumonia, hospitalization w/ concern for sedentary status secondary to illness, and overt s/s of aspiration w/ thin liquids, recommend dysphagia level 3 diet(minced meats) w/ Nectar consistency liquids via CUP; aspiration precautions; Pills Whole in puree for safety; tray setup at meals; Supervision/monitoring at meals as needed, reduce Distractions during meals. NSG/MD updated.  ST services will continue to monitor pt's status while admitted for need for objective swallow study. SLP Visit Diagnosis: Dysphagia, oropharyngeal phase (R13.12) (no Bottom Dentition)    Aspiration Risk  Moderate aspiration risk;Risk for inadequate nutrition/hydration    Diet Recommendation   dysphagia level 3 diet(minced meats) w/ Nectar consistency liquids via CUP; aspiration precautions; Pills Whole in puree for safety; tray setup at meals; Supervision/monitoring at meals as needed, reduce Distractions during meals.  Medication Administration: Whole meds with puree    Other  Recommendations Recommended Consults:  (Dietician f/u) Oral Care Recommendations: Oral care BID;Oral care before and after PO;Staff/trained caregiver to provide oral care (Denture care) Other Recommendations: Order thickener from pharmacy;Prohibited food (jello, ice cream, thin soups);Remove water pitcher;Have oral suction available    Recommendations for follow up therapy are one component of a multi-disciplinary discharge planning process, led by the attending physician.  Recommendations may be updated based on patient status, additional functional criteria and insurance authorization.  Follow up Recommendations Skilled nursing-short term rehab (<3 hours/day)      Assistance Recommended at Discharge Set up Supervision/Assistance  Functional Status  Assessment Patient has had a recent decline in their functional status and demonstrates the ability to make significant improvements in function in a reasonable and predictable amount of time.  Frequency and Duration min 3x week  2 weeks       Prognosis Prognosis for Safe Diet Advancement: Fair (-Good) Barriers to Reach Goals: Time post onset;Severity of deficits Barriers/Prognosis Comment: unsure of full Cognitive status premorbid      Swallow Study   General Date of Onset: 01/10/22 HPI: Pt is a 81 y.o. 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     Patient has altered mental status, cannot provide accurate medical history.  I have tried to call his family without success.       Per report, patient has been confused in the past several days.  When I saw patient in the emergency room, patient is confused, knows his own name, knows that he is in hospital, but is confused about time.  He moves all extremities.  No facial droop or slurred speech.  He has pressure ulcers in both heels.  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  The left lower leg and foot are erythematous and tender.  His body temperature 99.7.  Patient does not have active respiratory distress, cough, nausea, vomiting and diarrhea.  Not sure if patient has symptoms of UTI.   CXR at admit: No active disease. Type of Study: Bedside Swallow Evaluation Previous Swallow Assessment: none Diet Prior to this Study: Regular;Thin liquids Temperature Spikes Noted: No (wbc 15.4 declining) Respiratory Status: Room air History of Recent Intubation: No Behavior/Cognition: Alert;Cooperative;Pleasant mood;Requires cueing Oral  Cavity Assessment: Within Functional Limits Oral Care Completed by SLP: Yes Oral Cavity - Dentition: Dentures, top (no Bottom Dentition or plate) Vision: Functional for self-feeding Self-Feeding Abilities: Able to feed self;Needs assist;Needs set up Patient Positioning: Upright in bed Baseline Vocal Quality: Normal Volitional Cough: Strong Volitional Swallow: Able to elicit    Oral/Motor/Sensory Function Overall Oral Motor/Sensory Function: Within functional limits (grossly)   Ice Chips Ice chips: Within functional limits Presentation: Spoon (fed; 3 trials)   Thin Liquid Thin Liquid: Impaired Presentation: Cup;Self Fed (12 trials) Oral Phase Impairments:  (deliberate self-feeding) Pharyngeal  Phase Impairments: Cough - Immediate (x2/12)    Nectar Thick Nectar Thick Liquid: Within functional limits Presentation: Cup;Self Fed (~2-3 ozs)   Honey Thick Honey Thick Liquid: Not tested   Puree Puree: Within functional limits Presentation: Spoon;Self Fed (6 trials)   Solid     Solid: Impaired (no lower Dentition) Presentation: Self Fed;Spoon (5 trials) Oral Phase Impairments: Impaired mastication (no lower Dentition) Oral Phase Functional Implications: Impaired mastication Pharyngeal Phase Impairments:  (none) Other Comments: moistened foods well        Orinda Kenner, MS, CCC-SLP Speech Language Pathologist Rehab Services; [place] [phone] (ascom)  Kemba Hoppes 01/12/2022,4:32 PM

## 2022-01-12 NOTE — Assessment & Plan Note (Signed)
Resolved. Mg 1.5>>2.2

## 2022-01-12 NOTE — Assessment & Plan Note (Signed)
Swallow evaluation was obtained with nursing concern of coughing with food.  They are recommending nectar thick and dysphagia diet. -Continue with dysphagia diet with nectar thick liquid as recommended.

## 2022-01-12 NOTE — Plan of Care (Signed)

## 2022-01-12 NOTE — Progress Notes (Signed)
Inpatient Diabetes Program Recommendations  AACE/ADA: New Consensus Statement on Inpatient Glycemic Control  Target Ranges:  Prepandial:   less than 140 mg/dL      Peak postprandial:   less than 180 mg/dL (1-2 hours)      Critically ill patients:  140 - 180 mg/dL    Latest Reference Range & Units 01/11/22 07:51 01/12/22 07:40  Glucose-Capillary 70 - 99 mg/dL 322 (H) 025 (H)    Latest Reference Range & Units 01/10/22 07:57 01/11/22 01:58 01/12/22 05:36  Glucose 70 - 99 mg/dL 427 (H) 062 (H) 376 (H)   Review of Glycemic Control  Diabetes history: NO Outpatient Diabetes medications: NA Current orders for Inpatient glycemic control: Solumedrol 125 mg Q12H  Inpatient Diabetes Program Recommendations:    Insulin: If steroids are continued, please consider ordering CBGs AC&HS and Novolog 0-9 units AC&HS.  NOTE: Patient from [facility] with AMS and left leg and foot pain and admitted with cellulitis of LLE, UTI, sepsis, and AMS. Patient is currently ordered steroids and finger stick glucose 253 mg/dl this morning. Patient is currently ordered steroids which are likely cause of hyperglycemia.  Thanks, Orlando Penner, RN, MSN, CDCES Diabetes Coordinator Inpatient Diabetes Program [phone] (Team Pager from 8am to 5pm)

## 2022-01-12 NOTE — Assessment & Plan Note (Signed)
UA positive for UTI. Pt has hx of positive culture of VRE.  Urine cultures with multiple species. -on cefepime

## 2022-01-12 NOTE — Assessment & Plan Note (Signed)
Resolved.  Potassium at 3.5 today -Continue to monitor and replete as needed-patient is being diuresed

## 2022-01-12 NOTE — Assessment & Plan Note (Signed)
Sepsis due to UTI and cellulitis of left lower extremity: Meets criteria for sepsis with WBC 18.5, RR 22.  Lactic acid normal. -Antibiotics as above -Urine culture with multiple species and blood cultures negative so far -IV fluid: Patient received 500 cc normal saline (patient has elevated BNP 550, lipidemia aggressive IV fluid treatment)

## 2022-01-12 NOTE — Plan of Care (Signed)
  Problem: Clinical Measurements: Goal: Ability to maintain clinical measurements within normal limits will improve Outcome: Progressing Goal: Will remain free from infection Outcome: Progressing Goal: Diagnostic test results will improve Outcome: Progressing Goal: Respiratory complications will improve Outcome: Progressing Goal: Cardiovascular complication will be avoided Outcome: Progressing   Problem: Elimination: Goal: Will not experience complications related to bowel motility Outcome: Progressing Goal: Will not experience complications related to urinary retention Outcome: Progressing   Problem: Nutrition: Goal: Adequate nutrition will be maintained Outcome: Progressing   

## 2022-01-12 NOTE — Evaluation (Signed)
Physical Therapy Evaluation Patient Details Name: [name] MRN: [id number] DOB: 1940-11-27 Today's Date: 01/12/2022  History of Present Illness  [name] is a 81yoM who comes to [facility] on 01/10/22 from [place] [facility] ALF with 3d AMS. Current Left toe and calf wounds. PMH: HTN, HLD, COPD, GERD, depression BPD, schizophrenia, seizure d/o, DVT not on AC, dCHF, C diff, BPH, CKD3a. Pt admitted with LLE cellulitis, also (+) UTI. Per 2017 PT notes baseline mobility includes WC at ALF and physical assist for transfers, modA+1 for bed mobility and transfers.  Clinical Impression  Pt admitted with above diagnosis. Pt currently with functional limitations due to the deficits listed below (see "PT Problem List"). Upon entry, pt in bed, awake and agreeable to participate. The pt is alert, pleasant, interactive, and able to provide info regarding prior level of function, both in tolerance and independence. Max+2 assist to from EOB. Pt does well sitting EOB, good static trunk control. Pt able to perform seated heel slides, but unable to generate enough force to LAQ. He does not perform seated marching, but may be due to confusion, difficult to tell if he is trying or not. Patient's performance this date reveals decreased ability, independence, and tolerance in performing all basic mobility required for performance of activities of daily living. Pt requires additional DME, close physical assistance, and cues for safe participate in mobility. Pt will benefit from skilled PT intervention to increase independence and safety with basic mobility in preparation for discharge to the venue listed below.     No data found.        Recommendations for follow up therapy are one component of a multi-disciplinary discharge planning process, led by the attending physician.  Recommendations may be updated based on patient status, additional functional criteria and insurance authorization.  Follow Up Recommendations Home  health PT (back to ALF c HHPT)    Assistance Recommended at Discharge Frequent or constant Supervision/Assistance  Patient can return home with the following  Two people to help with bathing/dressing/bathroom;Two people to help with walking and/or transfers;Direct supervision/assist for medications management;Assistance with cooking/housework;Assist for transportation;Assistance with feeding    Equipment Recommendations None recommended by PT  Recommendations for Other Services       Functional Status Assessment Patient has had a recent decline in their functional status and demonstrates the ability to make significant improvements in function in a reasonable and predictable amount of time.     Precautions / Restrictions Precautions Precautions: Fall Restrictions Weight Bearing Restrictions: No      Mobility  Bed Mobility Overal bed mobility: Needs Assistance Bed Mobility: Supine to Sit, Sit to Supine     Supine to sit: Max assist, +2 for physical assistance Sit to supine: Max assist, +2 for physical assistance   General bed mobility comments: cannot move legs ad lib due to weakness/edema    Transfers                   General transfer comment: did not attempt; pt requires physical assit for transfers baseline and today is much weaker.    Ambulation/Gait Ambulation/Gait assistance:  (p tis nonambulatory at baseline)                Stairs            Wheelchair Mobility    Modified Rankin (Stroke Patients Only)       Balance  Pertinent Vitals/Pain Pain Assessment Pain Assessment: Faces Faces Pain Scale: Hurts little more (back hurt whil ein bed, better after sitting EOB)    Home Living Family/patient expects to be discharged to:: Assisted living                 Home Equipment: Wheelchair - manual;Hospital bed      Prior Function Prior Level of Function : Needs  assist             Mobility Comments: Pt requires assist from staff for all bed mobility and transfers in/out of wheelchair ADLs Comments: Per pt report he is able to complete self feeding and grooming himself but requires significant assist from staff for all other ADL and all IADL     Hand Dominance   Dominant Hand: Right    Extremity/Trunk Assessment   Upper Extremity Assessment Upper Extremity Assessment: Generalized weakness    Lower Extremity Assessment Lower Extremity Assessment: Generalized weakness;LLE deficits/detail LLE Deficits / Details: toe pain       Communication      Cognition Arousal/Alertness: Awake/alert Behavior During Therapy: WFL for tasks assessed/performed Overall Cognitive Status: Within Functional Limits for tasks assessed                                 General Comments: reports his mentation is still not baseline, but much improved. he is conversive and follows simple cues well.        General Comments      Exercises     Assessment/Plan    PT Assessment Patient needs continued PT services  PT Problem List Decreased strength;Decreased activity tolerance;Decreased balance;Decreased mobility;Decreased cognition;Decreased safety awareness;Decreased knowledge of precautions       PT Treatment Interventions Balance training;DME instruction;Functional mobility training;Therapeutic activities;Therapeutic exercise;Wheelchair mobility training;Patient/family education    PT Goals (Current goals can be found in the Care Plan section)  Acute Rehab PT Goals Patient Stated Goal: prevent decline, return to baseline bed to [facility] transfers PT Goal Formulation: With patient Time For Goal Achievement: 01/26/22 Potential to Achieve Goals: Good    Frequency Min 2X/week     Co-evaluation               AM-PAC PT "6 Clicks" Mobility  Outcome Measure Help needed turning from your back to your side while in a flat bed without  using bedrails?: Total Help needed moving from lying on your back to sitting on the side of a flat bed without using bedrails?: Total Help needed moving to and from a bed to a chair (including a wheelchair)?: Total Help needed standing up from a chair using your arms (e.g., wheelchair or bedside chair)?: Total Help needed to walk in hospital room?: Total Help needed climbing 3-5 steps with a railing? : Total 6 Click Score: 6    End of Session   Activity Tolerance: Patient tolerated treatment well;No increased pain;Patient limited by fatigue Patient left: in bed;with call bell/phone within reach;with bed alarm set Nurse Communication: Mobility status PT Visit Diagnosis: Muscle weakness (generalized) (M62.81);Unsteadiness on feet (R26.81);Other symptoms and signs involving the nervous system (J62.831)    Time: 5176-1607 PT Time Calculation (min) (ACUTE ONLY): 26 min   Charges:   PT Evaluation $PT Eval Moderate Complexity: 1 Mod PT Treatments $Therapeutic Activity: 8-22 mins       12:03 PM, 01/12/22 Rosamaria Lints, PT, DPT Physical Therapist -  [facility]  [phone] (ASCOM)    Kylii Ennis C 01/12/2022, 12:02 PM

## 2022-01-12 NOTE — NC FL2 (Signed)
[place] LEVEL OF CARE SCREENING TOOL     IDENTIFICATION  Patient Name: [name] Birthdate: 05/10/1941 Sex: male Admission Date (Current Location): 01/10/2022  [facility] and [place] Number:  Engineering geologist and Address:  [facility], [address] [place], [place] [address]      Provider Number: [id number]  Attending Physician Name and Address:  Lorella Nimrod, MD  Relative Name and Phone Number:  Richarda Blade    Current Level of Care: Hospital Recommended Level of Care: [place] Prior Approval Number:    Date Approved/Denied:   PASRR Number: pending  Discharge Plan: SNF    Current Diagnoses: Patient Active Problem List   Diagnosis Date Noted   Cellulitis of left lower extremity 01/10/2022   Hypomagnesemia 01/10/2022   Hypertension    Depression    Chronic diastolic CHF (congestive heart failure) (HCC)    Chronic kidney disease, stage 3a ([place])    Acute metabolic encephalopathy    Normocytic anemia    Cellulitis 04/10/2021   Proctocolitis 12/23/2016   Acute posthemorrhagic anemia 11/25/2016   Hematuria 11/25/2016   Guaiac positive stools 11/25/2016   Acute renal insufficiency 11/25/2016   Pressure injury of skin 09/23/2016   Back pain 05/28/2016   Sepsis ([place]) 05/27/2016   UTI (urinary tract infection) 05/27/2016   Escherichia coli (E. 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[place]) 08/07/2015   Pneumonia 02/21/2015   ARF (acute renal failure)  (HCC) 02/21/2015   Hypokalemia 02/21/2015   Leukocytosis 02/21/2015   Venous insufficiency of both lower extremities 12/04/2014   Benign essential hypertension 11/25/2014   Bilateral carotid artery stenosis 11/15/2014   Bilateral leg edema 11/15/2014   COPD (chronic obstructive pulmonary disease) ([place]) 11/15/2014   Gastroesophageal reflux disease 11/15/2014   Mild aortic valve stenosis 11/15/2014   Mixed hyperlipidemia 11/15/2014   Obesity 11/15/2014   Seizure disorder ([place]) 11/15/2014    Orientation RESPIRATION BLADDER Height & Weight     Self, Situation, Place  Normal Continent Weight: 116.1 kg Height:  5\' 10"  (177.8 cm)  BEHAVIORAL SYMPTOMS/MOOD NEUROLOGICAL BOWEL NUTRITION STATUS    Convulsions/Seizures Continent Diet (See DC summary)  AMBULATORY STATUS COMMUNICATION OF NEEDS Skin   Extensive Assist Verbally Other (Comment) (cellulitis lower legs)                       Personal Care Assistance Level of Assistance  Bathing, Feeding, Dressing Bathing Assistance: Maximum assistance Feeding assistance: Limited assistance Dressing Assistance: Maximum assistance     Functional Limitations Info             SPECIAL CARE FACTORS FREQUENCY  PT (By licensed PT), OT (By licensed OT)     PT Frequency: 5 times per week OT Frequency: 5 times per week            Contractures Contractures Info: Not present    Additional Factors Info  Code Status, Allergies Code Status Info: full code Allergies Info: Penicillin  Current Medications (01/12/2022):  This is the current hospital active medication list Current Facility-Administered Medications  Medication Dose Route Frequency Provider Last Rate Last Admin   acetaminophen (TYLENOL) tablet 650 mg  650 mg Oral Q6H PRN Ivor Costa, MD   650 mg at 01/11/22 2236   albuterol (PROVENTIL) (2.5 MG/3ML) 0.083% nebulizer solution 2.5 mg  2.5 mg Nebulization Q4H PRN Ivor Costa, MD       ARIPiprazole (ABILIFY) tablet 20 mg   20 mg Oral q morning Ivor Costa, MD   20 mg at 01/11/22 1030   buPROPion (WELLBUTRIN XL) 24 hr tablet 300 mg  300 mg Oral Daily Ivor Costa, MD   300 mg at 01/11/22 1029   ceFEPIme (MAXIPIME) 2 g in sodium chloride 0.9 % 100 mL IVPB  2 g Intravenous Q12H Benita Gutter, RPH   Stopped at 01/11/22 2230   Chlorhexidine Gluconate Cloth 2 % PADS 6 each  6 each Topical Daily Lorella Nimrod, MD   6 each at 01/11/22 1038   cholecalciferol (VITAMIN D3) tablet 1,000 Units  1,000 Units Oral q morning Ivor Costa, MD   1,000 Units at 01/11/22 1030   dextromethorphan-guaiFENesin ([place] DM) 30-600 MG per 12 hr tablet 1 tablet  1 tablet Oral BID PRN Ivor Costa, MD   1 tablet at 01/10/22 2329   enoxaparin (LOVENOX) injection 57.5 mg  0.5 mg/kg Subcutaneous Q24H Ivor Costa, MD   57.5 mg at 01/11/22 1030   ferrous sulfate tablet 325 mg  325 mg Oral Q breakfast Ivor Costa, MD   325 mg at 01/12/22 0854   finasteride (PROSCAR) tablet 5 mg  5 mg Oral Daily Ivor Costa, MD   5 mg at [id number]   folic acid (FOLVITE) tablet 1 mg  1 mg Oral q morning Ivor Costa, MD   1 mg at 01/11/22 1030   hydrALAZINE (APRESOLINE) injection 5 mg  5 mg Intravenous Q2H PRN Ivor Costa, MD       loratadine (CLARITIN) tablet 10 mg  10 mg Oral Daily Ivor Costa, MD   10 mg at 01/11/22 1030   LORazepam (ATIVAN) injection 1 mg  1 mg Intravenous Q2H PRN Ivor Costa, MD       methylPREDNISolone sodium succinate (SOLU-MEDROL) 125 mg/2 mL injection 125 mg  125 mg Intravenous Q12H Zierle-Ghosh, Asia B, DO   125 mg at 01/12/22 0257   midodrine (PROAMATINE) tablet 10 mg  10 mg Oral BID WC Zierle-Ghosh, Asia B, DO   10 mg at 01/12/22 0854   multivitamin with minerals tablet 1 tablet  1 tablet Oral Daily Ivor Costa, MD   1 tablet at 01/11/22 1029   naloxone (NARCAN) injection 0.4 mg  0.4 mg Intravenous PRN Zierle-Ghosh, Asia B, DO   0.4 mg at 01/11/22 0232   ondansetron (ZOFRAN) injection 4 mg  4 mg Intravenous Q8H PRN Ivor Costa, MD       oxyCODONE  (Oxy IR/ROXICODONE) immediate release tablet 5 mg  5 mg Oral Q8H PRN Ivor Costa, MD       oxyCODONE-acetaminophen (PERCOCET/ROXICET) 5-325 MG per tablet 1 tablet  1 tablet Oral Q4H PRN Ivor Costa, MD       PHENobarbital (LUMINAL) tablet 64.8 mg  64.8 mg Oral Henreitta Cea, MD   64.8 mg at 01/10/22 1209   pravastatin (PRAVACHOL) tablet 40 mg  40 mg Oral Daily Ivor Costa, MD   40 mg at 01/11/22 1029   vancomycin (VANCOCIN) IVPB 1000 mg/200 mL premix  1,000 mg Intravenous Q24H Dorothe Pea, [place] at 01/11/22 1900     Discharge Medications: Please see discharge summary for a list of discharge medications.  Relevant Imaging Results:  Relevant Lab Results:   Additional Information SSN [id number]  Conception Oms, RN

## 2022-01-12 NOTE — Assessment & Plan Note (Signed)
Pt has left lower extremity cellulitis in left foot and lower leg.  - Empiric antimicrobial treatment with vancomycin and cefepime (patient received 1 dose of Flagyl in ED) - Blood cultures x 2 -negative so far - ESR and CRP-significantly elevated with ESR of 111 and CRP of 19.4 - wound care consult-appreciate their recommendations - As needed Percocet and Tylenol for pain, Zofran for nausea

## 2022-01-12 NOTE — Progress Notes (Signed)
The above named patient is recommended to go to Short Term Rehab for strengthening and gait training for balance.  It is expected that the Short Term Rehab stay will be less than 30 days.  The patient is expected to return home after Rehab.  ?

## 2022-01-12 NOTE — Assessment & Plan Note (Signed)
Possibly due to UTI.  CT head negative. -Frequent neurochecks

## 2022-01-13 ENCOUNTER — Inpatient Hospital Stay (HOSPITAL_COMMUNITY): Payer: Medicare Other

## 2022-01-13 DIAGNOSIS — R1312 Dysphagia, oropharyngeal phase: Secondary | ICD-10-CM

## 2022-01-13 DIAGNOSIS — R1311 Dysphagia, oral phase: Secondary | ICD-10-CM

## 2022-01-13 DIAGNOSIS — L03116 Cellulitis of left lower limb: Secondary | ICD-10-CM | POA: Diagnosis not present

## 2022-01-13 LAB — BASIC METABOLIC PANEL
Anion gap: 5 (ref 5–15)
BUN: 44 mg/dL — ABNORMAL HIGH (ref 8–23)
CO2: 23 mmol/L (ref 22–32)
Calcium: 8 mg/dL — ABNORMAL LOW (ref 8.9–10.3)
Chloride: 109 mmol/L (ref 98–111)
Creatinine, Ser: 1.35 mg/dL — ABNORMAL HIGH (ref 0.61–1.24)
GFR, Estimated: 53 mL/min — ABNORMAL LOW (ref >60)
Glucose, Bld: 190 mg/dL — ABNORMAL HIGH (ref 70–99)
Potassium: 3.3 mmol/L — ABNORMAL LOW (ref 3.5–5.1)
Sodium: 137 mmol/L (ref 135–145)

## 2022-01-13 LAB — GLUCOSE, CAPILLARY
Glucose-Capillary: 133 mg/dL — ABNORMAL HIGH (ref 70–99)
Glucose-Capillary: 139 mg/dL — ABNORMAL HIGH (ref 70–99)
Glucose-Capillary: 160 mg/dL — ABNORMAL HIGH (ref 70–99)
Glucose-Capillary: 184 mg/dL — ABNORMAL HIGH (ref 70–99)

## 2022-01-13 LAB — CBC
HCT: 31.8 % — ABNORMAL LOW (ref 39.0–52.0)
Hemoglobin: 10 g/dL — ABNORMAL LOW (ref 13.0–17.0)
MCH: 27.2 pg (ref 26.0–34.0)
MCHC: 31.4 g/dL (ref 30.0–36.0)
MCV: 86.6 fL (ref 80.0–100.0)
Platelets: 397 10*3/uL (ref 150–400)
RBC: 3.67 MIL/uL — ABNORMAL LOW (ref 4.22–5.81)
RDW: 15.4 % (ref 11.5–15.5)
WBC: 17.8 10*3/uL — ABNORMAL HIGH (ref 4.0–10.5)
nRBC: 0 % (ref 0.0–0.2)

## 2022-01-13 MED ORDER — POTASSIUM CHLORIDE CRYS ER 20 MEQ PO TBCR
40.0000 meq | EXTENDED_RELEASE_TABLET | Freq: Once | ORAL | Status: DC
  Filled 2022-01-13 (×2): qty 2

## 2022-01-13 MED ORDER — RISAQUAD PO CAPS
1.0000 | ORAL_CAPSULE | Freq: Every day | ORAL | Status: DC
  Administered 2022-01-13 – 2022-01-16 (×4): 1 via ORAL
  Filled 2022-01-13 (×4): qty 1

## 2022-01-13 NOTE — Progress Notes (Signed)
Modified Barium Swallow Progress Note  Patient Details  Name: [name] MRN: [id number] Date of Birth: 1941/07/07  Today's Date: 01/13/2022  Modified Barium Swallow completed.  Full report located under Chart Review in the Imaging Section.  Brief recommendations include the following:  Clinical Impression  Pt presents with mild oral phase and moderate pharyngeal phase dysphagia resulting in silent aspiration of thin liquids via spoon and cup as well as nectar thick liquids via spoon and cup.       When consuming thin liquids, nectar thick liquids and honey thick liquids via spoon and cup as well as puree and graham cracker with barium pt's oral phase is c/b premature spillage of boluses d/t decreased bolus cohesion.       Pt's moderate pharyngeal dysphagia is c/b delayed swallow initiation resulting in gross silent aspiration before and during the swallow when consuming thin liquids via spoon and cup as well as nectar thick liquids via spoon and cup. Pt with 1 instance of coughing but this was very delayed with aspirates deep within trachea at that point. Pt's swallow initiation was more timely when consuming honey thick liquids via spoon and cup as well as puree and graham cracker. Attempted having pt orally hold thinner consistencies but pt unable to perform compensatory strategy.   At this time, safest diet recommend would be dysphagia 3 with honey thick liquids via cup, medicine whole in puree with strict aspiration precautions.   Swallow Evaluation Recommendations       SLP Diet Recommendations: Dysphagia 3 (Mech soft) solids;Honey thick liquids   Liquid Administration via: Cup   Medication Administration: Whole meds with puree   Supervision: Patient able to self feed;Staff to assist with self feeding;Intermittent supervision to cue for compensatory strategies   Compensations: Minimize environmental distractions;Slow rate;Small sips/bites;Lingual sweep for clearance of  pocketing;Follow solids with liquid   Postural Changes: Seated upright at 90 degrees   Oral Care Recommendations: Oral care BID   Other Recommendations: Order thickener from pharmacy;Prohibited food (jello, ice cream, thin soups);Remove water pitcher;Have oral suction available   [name], M.S., CCC-SLP, CBIS Speech-Language Pathologist Certified Brain Injury Specialist [facility]  [facility] [phone] Ascom [phone] Fax [phone]  [name] [name] 01/13/2022,3:00 PM

## 2022-01-13 NOTE — Progress Notes (Signed)
PROGRESS NOTE    [name]  WNU:[id number] DOB: Feb 10, 1941 DOA: 01/10/2022 PCP: Merlene Laughter, MD    Brief Narrative:  Taken from H&P.   [name] is a 81 y.o. 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 Patient was unable to provide meaningful history and admitting provider was unable to reach family despite multiple attempts. During admission patient is confused, knows his own name, knows that he is in hospital, but is confused about time.  He moves all extremities.  No facial droop or slurred speech.  He has pressure ulcers in both heels.  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  The left lower leg and foot are erythematous and tender.   Data Reviewed and ED Course: pt was found to have WBC 18.5, BMP 550, lactic acid 0.9, INR 1.3, positive urinalysis (cloudy appearance, moderate amount of leukocyte, many bacteria, WBC> 50), negative COVID PCR, CK level 88, renal function close to baseline, potassium 2.2, temperature 99.7, blood pressure 132/74, RR 22, HR 83, oxygen saturation 99% on room air.  Chest x-ray negative.  VBG with pH 7.43, CO2 43. CT head negative for acute intracranial abnormalities.  Left foot x-rays showed no evidence of osteomyelitis   EKG: I have personally reviewed.  Sinus rhythm, PVC, QTc 388, nonspecific T wave change.   Patient met sepsis criteria with fever of 100.8, leukocytosis at 18.5, tachypnea.  UA look infected, history of positive urine cultures of VRE.  Blood and urine cultures pending. Also concern of left lower extremity cellulitis.  He was started on cefepime and vancomycin.   6/5: Procalcitonin elevated at 1.09, troponin  at 40, BNP 530, elevated ESR at 111 and CRP 19.4 Slight decrease in leukocytosis to 15.4 but all cell lines decreased.  Received about 1 L of fluid. Significant hypokalemia with normal magnesium. Prevana boot were placed.  Urine culture with multiple species.  Blood cultures remain negative.   6/6: Blood cultures remain negative and urine cultures with multiple species. Still significant lower extremity edema requiring IV Lasix.  Good urinary output.  Renal functions with slight improvement  6/7 no complaints this am. No overnight issues  Consultants:    Procedures:   Antimicrobials:      Subjective: No sob, or cp  Objective: Vitals:   01/12/22 1657 01/12/22 1957 01/13/22 0401 01/13/22 0814  BP: 115/65 128/69 137/60 (!) 143/81  Pulse: 65 63 (!) 53 (!) 57  Resp: $Remo'17 17 15 16  'lNmWB$ Temp: 97.6 F (36.4 C) 98.1 F (36.7 C) 98.1 F (36.7 C) (!) 97.4 F (36.3 C)  TempSrc:      SpO2: 100% 100% 99% 100%  Weight:      Height:        Intake/Output Summary (Last 24 hours) at 01/13/2022 0841 Last data filed at 01/13/2022 0436 Gross per 24 hour  Intake 340 ml  Output 1775 ml  Net -1435 ml   Filed Weights   01/10/22 0749  Weight: 116.1 kg    Examination:  Calm, NAD Cta no w/r Reg s1/s2 no gallop Soft benign +bs B/L edema.  Awake and alert Mood and affect appropriate in current setting     Data Reviewed: I have personally reviewed following labs and imaging studies  CBC: Recent Labs  Lab 01/10/22 0757 01/11/22 0158  WBC 18.5* 15.4*  NEUTROABS 16.1*  --   HGB 10.7* 9.4*  HCT 33.6* 29.4*  MCV 85.3 84.7  PLT 304 121   Basic Metabolic Panel: Recent Labs  Lab 01/10/22 0757 01/11/22 0158 01/11/22 0946 01/12/22 0536 01/13/22 0420  NA 141 142  --  138 137  K 2.2* 2.4* 3.2* 3.5 3.3*  CL 112* 114*  --  110 109  CO2 25 24  --  22 23  GLUCOSE 121* 106*  --  212* 190*  BUN 37* 35*  --  38* 44*  CREATININE 1.51* 1.54*  --  1.41* 1.35*  CALCIUM 7.7* 7.2*  --  7.9*  8.0*  MG 1.9 2.2  --   --   --    GFR: Estimated Creatinine Clearance: 56.6 mL/min (A) (by C-G formula based on SCr of 1.35 mg/dL (H)). Liver Function Tests: Recent Labs  Lab 01/10/22 0757  AST 88*  ALT 39  ALKPHOS 136*  BILITOT 0.8  PROT 6.6  ALBUMIN 2.1*   No results for input(s): LIPASE, AMYLASE in the last 168 hours. No results for input(s): AMMONIA in the last 168 hours. Coagulation Profile: Recent Labs  Lab 01/10/22 0757  INR 1.3*   Cardiac Enzymes: Recent Labs  Lab 01/10/22 0757  CKTOTAL 88   BNP (last 3 results) No results for input(s): PROBNP in the last 8760 hours. HbA1C: Recent Labs    01/12/22 0537  HGBA1C 5.8*   CBG: Recent Labs  Lab 01/12/22 0740 01/12/22 1205 01/12/22 1658 01/12/22 2217 01/13/22 0818  GLUCAP 253* 252* 236* 211* 133*   Lipid Profile: No results for input(s): CHOL, HDL, LDLCALC, TRIG, CHOLHDL, LDLDIRECT in the last 72 hours. Thyroid Function Tests: No results for input(s): TSH, T4TOTAL, FREET4, T3FREE, THYROIDAB in the last 72 hours. Anemia Panel: No results for input(s): VITAMINB12, FOLATE, FERRITIN, TIBC, IRON, RETICCTPCT in the last 72 hours. Sepsis Labs: Recent Labs  Lab 01/10/22 0757 01/10/22 1914  PROCALCITON 1.09  --   LATICACIDVEN 0.9 1.1    Recent Results (from the past 240 hour(s))  Resp Panel by RT-PCR (Flu A&B, Covid) Anterior Nasal Swab     Status: None   Collection Time: 01/10/22  7:57 AM   Specimen: Anterior Nasal Swab  Result Value Ref Range Status   SARS Coronavirus 2 by RT PCR NEGATIVE NEGATIVE Final    Comment: (NOTE) SARS-CoV-2 target nucleic acids are NOT DETECTED.  The SARS-CoV-2 RNA is generally detectable in upper respiratory specimens during the acute phase of infection. The lowest concentration of SARS-CoV-2 viral copies this assay can detect is 138 copies/mL. A negative result does not preclude SARS-Cov-2 infection and should not be used as the sole basis for treatment or other  patient management decisions. A negative result may occur with  improper specimen collection/handling, submission of specimen other than nasopharyngeal swab, presence of viral mutation(s) within the areas targeted by this assay, and inadequate number of viral copies(<138 copies/mL). A negative result must be combined with clinical observations, patient history, and epidemiological information. The expected result is Negative.  Fact Sheet for Patients:  EntrepreneurPulse.com.au  Fact Sheet for Healthcare Providers:  IncredibleEmployment.be  This test is no t yet approved or cleared by the [place] FDA and  has been authorized for detection and/or diagnosis of SARS-CoV-2 by FDA under an Emergency Use Authorization (EUA). This EUA will remain  in effect (meaning this test can be used) for the duration  of the COVID-19 declaration under Section 564(b)(1) of the Act, 21 U.S.C.section 360bbb-3(b)(1), unless the authorization is terminated  or revoked sooner.       Influenza A by PCR NEGATIVE NEGATIVE Final   Influenza B by PCR NEGATIVE NEGATIVE Final    Comment: (NOTE) The Xpert Xpress SARS-CoV-2/FLU/RSV plus assay is intended as an aid in the diagnosis of influenza from Nasopharyngeal swab specimens and should not be used as a sole basis for treatment. Nasal washings and aspirates are unacceptable for Xpert Xpress SARS-CoV-2/FLU/RSV testing.  Fact Sheet for Patients: EntrepreneurPulse.com.au  Fact Sheet for Healthcare Providers: IncredibleEmployment.be  This test is not yet approved or cleared by the [place] FDA and has been authorized for detection and/or diagnosis of SARS-CoV-2 by FDA under an Emergency Use Authorization (EUA). This EUA will remain in effect (meaning this test can be used) for the duration of the COVID-19 declaration under Section 564(b)(1) of the Act, 21 U.S.C. section  360bbb-3(b)(1), unless the authorization is terminated or revoked.  Performed at [facility], [address] [place], [place] [address]   Blood Culture (routine x 2)     Status: None (Preliminary result)   Collection Time: 01/10/22  7:57 AM   Specimen: BLOOD  Result Value Ref Range Status   Specimen Description BLOOD BLOOD RIGHT FOREARM  Final   Special Requests   Final    BOTTLES DRAWN AEROBIC AND ANAEROBIC Blood Culture results may not be optimal due to an excessive volume of blood received in culture bottles   Culture   Final    NO GROWTH 3 DAYS Performed at [facility], [address], [place], [place] [address]    Report Status PENDING  Incomplete  Blood Culture (routine x 2)     Status: None (Preliminary result)   Collection Time: 01/10/22  7:57 AM   Specimen: BLOOD  Result Value Ref Range Status   Specimen Description BLOOD LEFT ARM  Final   Special Requests   Final    BOTTLES DRAWN AEROBIC AND ANAEROBIC Blood Culture adequate volume   Culture   Final    NO GROWTH 3 DAYS Performed at [facility], [address], [place], [place] [address]    Report Status PENDING  Incomplete  Urine Culture     Status: Abnormal   Collection Time: 01/10/22  7:57 AM   Specimen: In/Out Cath Urine  Result Value Ref Range Status   Specimen Description   Final    IN/OUT CATH URINE Performed at [facility], [address] [place], [place] [address]    Special Requests   Final    NONE Performed at [facility], [address] [place], [place] [address]    Culture MULTIPLE SPECIES PRESENT, SUGGEST RECOLLECTION (A)  Final   Report Status 01/11/2022 FINAL  Final  MRSA Next Gen by PCR, Nasal     Status: None   Collection Time: 01/10/22  1:42 PM   Specimen: Nasal Mucosa; Nasal Swab  Result Value Ref Range Status   MRSA by PCR Next Gen NOT DETECTED NOT DETECTED Final    Comment: (NOTE) The GeneXpert MRSA Assay (FDA approved for NASAL  specimens only), is one component of a comprehensive MRSA colonization surveillance program. It is not intended to diagnose MRSA infection nor to guide or monitor treatment for MRSA infections. Test performance is not FDA approved in patients less than 42 years old. Performed at [facility], [address] [place],  [address]  C Difficile Quick Screen w PCR reflex     Status: None   Collection Time: 01/11/22  7:24 PM   Specimen: STOOL  Result Value Ref Range Status   C Diff antigen NEGATIVE NEGATIVE Final   C Diff toxin NEGATIVE NEGATIVE Final   C Diff interpretation No C. difficile detected.  Final    Comment: Performed at [facility], [address], [place], [place] [address]         Radiology Studies: No results found.      Scheduled Meds:  acidophilus  1 capsule Oral Daily   ARIPiprazole  20 mg Oral q morning   vitamin C  500 mg Oral BID   buPROPion  300 mg Oral Daily   Chlorhexidine Gluconate Cloth  6 each Topical Daily   cholecalciferol  1,000 Units Oral q morning   enoxaparin (LOVENOX) injection  0.5 mg/kg Subcutaneous Q24H   feeding supplement (NEPRO CARB STEADY)  237 mL Oral BID BM   ferrous sulfate  325 mg Oral Q breakfast   finasteride  5 mg Oral Daily   folic acid  1 mg Oral q morning   insulin aspart  0-5 Units Subcutaneous QHS   insulin aspart  0-9 Units Subcutaneous TID WC   loratadine  10 mg Oral Daily   midodrine  10 mg Oral BID WC   multivitamin with minerals  1 tablet Oral Daily   PHENobarbital  64.8 mg Oral QODAY   potassium chloride  40 mEq Oral Once   pravastatin  40 mg Oral Daily   zinc sulfate  220 mg Oral Daily   Continuous Infusions:  ceFEPime (MAXIPIME) IV Stopped (01/12/22 2238)   vancomycin 1,000 mg (01/12/22 1746)    Assessment & Plan:   Principal Problem:   Cellulitis of left lower extremity Active Problems:   UTI (urinary tract infection)   Sepsis (HCC)   Acute metabolic encephalopathy    Chronic diastolic CHF (congestive heart failure) (HCC)   COPD (chronic obstructive pulmonary disease) (HCC)   Seizure disorder (HCC)   Chronic kidney disease, stage 3a ([place])   Depression   Hypertension   Mixed hyperlipidemia   BPH (benign prostatic hyperplasia)   Normocytic anemia   Hypokalemia   Hypomagnesemia   Dysphagia   Cellulitis of left lower extremity Pt has left lower extremity cellulitis in left foot and lower leg.  - Empiric antimicrobial treatment with vancomycin and cefepime (patient received 1 dose of Flagyl in ED) - Blood cultures x 2 -negative so far - ESR and CRP-significantly elevated with ESR of 111 and CRP of 19.4 - wound care consult-appreciate their recommendations 6/ 7 continue abx Ck am crp trebd       UTI (urinary tract infection) UA positive for UTI. Pt has hx of positive culture of VRE.  Urine cultures with multiple species. 6/7 continue abx    Sepsis ([place]) Sepsis due to UTI and cellulitis of left lower extremity:  Continue abx.     Acute metabolic encephalopathy Possibly due to UTI.  CT head negative. 6/7 continue neurocks   Chronic diastolic CHF (congestive heart failure) ([place]) 2D echo on 10/12/2018 showed EF> 55%.  Patient has bilateral leg edema. no worsening shortness of breath.  Oxygen saturation 99% on room air.  Patient does not seem to have acute CHF exacerbation.  His BNP is elevated 550, obviously pt is at risk of developing CHF exacerbation. Use lasix once renal function stable.   COPD (chronic obstructive pulmonary disease) ([place])  Stable -Bronchodilators   Seizure disorder ([place]) -Seizure precaution -As needed Ativan for seizure -Continue home phenobarbital   Chronic kidney disease, stage 3a (HCC) Recent baseline creatinine 1.0-1.4.  His creatinine is 1.51, BUN 37, close to baseline. -Follow-up by BMP   Depression - Continue home medications   Hypertension - IV hydralazine as needed -Hold amlodipine due to sepsis and  risk of developing hypotension -Coreg   Mixed hyperlipidemia - Pravastatin   BPH (benign prostatic hyperplasia) - Proscar   Normocytic anemia Hemoglobin stable, 10.7 today (10.1 on 04/11/2021) -Follow-up with CBC   Hypomagnesemia Resolved. Mg 1.5>>2.2     Hypokalemia Resolved.  Potassium at 3.5 today -Continue to monitor and replete as needed-patient is being diuresed       Dysphagia Swallow evaluation was obtained with nursing concern of coughing with food.  They are recommending nectar thick and dysphagia diet. -Continue with dysphagia diet with nectar thick liquid as recommended.   DVT prophylaxis: lovenox Code Status:full Family Communication: none at bedside Disposition Plan:  Status is: Inpatient Remains inpatient appropriate because: iv treatment.         LOS: 3 days   Time spent: 35 min     Nolberto Hanlon, MD [facility] Pager [phone]  If 7PM-7AM, please contact night-coverage 01/13/2022, 8:41 AM

## 2022-01-13 NOTE — Progress Notes (Signed)
Speech Language Pathology Treatment: Dysphagia  Patient Details Name: [name] MRN: [id number] DOB: 29-Jul-1941 Today's Date: 01/13/2022 Time: 1025-1100 SLP Time Calculation (min) (ACUTE ONLY): 35 min  Assessment / Plan / Recommendation Clinical Impression  Pt seen for ongoing assessment of swallowing; toleration of oral diet w/ trials to upgrade liquid consistency of diet if appropriate. Pt was sitting upright in bed w/ breakfast meal completed; last 1-2 bites w/ SLP. Pt was feeding himself given setup; consuming remainder of the Nectar liquids. Noted same slightly slower motor/verbal responses overall -- unsure of baseline Cognitive status(also in setting of illness, medical and pschiatric dxs, medications).  Pt appears to present w/ oropharyngeal phase dysphagia w/ suspected Neuromuscular deficits -- possible delay in pharyngeal swallow timing. Also noted impact from having No Bottom Dentition (lower Denture plate at "home" per pt report; wearing only the Upper Denture plate) on mastication of soft solids; increased Time needed for full chewing/mashing of foods b/f swallowing.    Pt consumed trials of thin and Nectar liquids via Cup(no straw) and soft solids given setup. Immediate, overt clinical s/s of aspiration noted w/ trials of thin liquids(coughing) = x2/5 trials; intermittent cough noted w/ Nectar liquids = x3/~14-16 ozs total. No decline in respiratory presentation noted during/post trials. Clear vocal quality noted b/t trials. Oral phase was c/b min slow, deliberate bolus management and oral clearing of all boluses given. He required increased Time for mastication d/t No bottom Dentition. A-P transfer was adequate w/ trials; oral clearing achieved w/ all consistencies.   Education provided on aspiration precautions including using SINGLE SIPS when drinking liquids: noted pt tended to drink w/ consecutive sips, keeping Cup at his mouth. Encouraged pt     In setting of increased risk  for aspiration/aspiration pneumonia, hospitalization w/ concern for sedentary status secondary to illness, and overt s/s of aspiration w/ thin liquids, recommend continue dysphagia level 3 diet(minced meats) w/ Nectar consistency liquids via CUP; aspiration precautions; Pills Whole in puree for safety; tray setup at meals; Supervision/monitoring at meals as needed, reduce Distractions during meals. NSG/MD updated.  Recommend MBSS w/ ST services to continue to monitor pt's status while admitted for toleration of diet/education.      HPI HPI: Pt is a 81 y.o. 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     Patient has altered mental status, cannot provide accurate medical history.  I have tried to call his family without success.       Per report, patient has been confused in the past several days.  When I saw patient in the emergency room, patient is confused, knows his own name, knows that he is in hospital, but is confused about time.  He moves all extremities.  No facial droop or slurred speech.  He has pressure ulcers in both heels.  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  The left lower leg and foot are erythematous and tender.  His body temperature 99.7.  Patient does not have active respiratory distress, cough, nausea, vomiting and diarrhea.  Not sure if patient has symptoms of UTI.   CXR at admit: No active disease.      SLP Plan  Continue with current plan of care      Recommendations for  follow up therapy are one component of a multi-disciplinary discharge planning process, led by the attending physician.  Recommendations may be updated based on patient status, additional  functional criteria and insurance authorization.    Recommendations  Diet recommendations: Dysphagia 3 (mechanical soft);Nectar-thick liquid Liquids provided via: Cup;No straw Medication Administration: Whole meds with puree Supervision: Patient able to self feed;Intermittent supervision to cue for compensatory strategies (setup) Compensations: Minimize environmental distractions;Slow rate;Small sips/bites;Lingual sweep for clearance of pocketing;Follow solids with liquid Postural Changes and/or Swallow Maneuvers: Out of bed for meals;Seated upright 90 degrees;Upright 30-60 min after meal                General recommendations:  (Dietician f/u) Oral Care Recommendations: Oral care BID;Oral care before and after PO;Staff/trained caregiver to provide oral care ([place] care) Follow Up Recommendations: Skilled nursing-short term rehab (<3 hours/day) Assistance recommended at discharge: Set up Supervision/Assistance SLP Visit Diagnosis: Dysphagia, oropharyngeal phase (R13.12) (no Bottom Dentition) Plan: Continue with current plan of care             Jerilynn Som, MS, CCC-SLP Speech Language Pathologist Rehab Services; [facility] [place] [phone] (ascom) Isabel Ardila  01/13/2022, 12:36 PM

## 2022-01-14 DIAGNOSIS — L03116 Cellulitis of left lower limb: Secondary | ICD-10-CM | POA: Diagnosis not present

## 2022-01-14 LAB — CBC
HCT: 31 % — ABNORMAL LOW (ref 39.0–52.0)
Hemoglobin: 9.7 g/dL — ABNORMAL LOW (ref 13.0–17.0)
MCH: 26.6 pg (ref 26.0–34.0)
MCHC: 31.3 g/dL (ref 30.0–36.0)
MCV: 85.2 fL (ref 80.0–100.0)
Platelets: 384 10*3/uL (ref 150–400)
RBC: 3.64 MIL/uL — ABNORMAL LOW (ref 4.22–5.81)
RDW: 15.4 % (ref 11.5–15.5)
WBC: 11.8 10*3/uL — ABNORMAL HIGH (ref 4.0–10.5)
nRBC: 0 % (ref 0.0–0.2)

## 2022-01-14 LAB — BLOOD GAS, VENOUS
Acid-Base Excess: 3.7 mmol/L — ABNORMAL HIGH (ref 0.0–2.0)
Bicarbonate: 28.5 mmol/L — ABNORMAL HIGH (ref 20.0–28.0)
O2 Saturation: 41.8 %
Patient temperature: 37
pCO2, Ven: 43 mmHg — ABNORMAL LOW (ref 44–60)
pH, Ven: 7.43 (ref 7.25–7.43)

## 2022-01-14 LAB — BASIC METABOLIC PANEL
Anion gap: 3 — ABNORMAL LOW (ref 5–15)
BUN: 45 mg/dL — ABNORMAL HIGH (ref 8–23)
CO2: 23 mmol/L (ref 22–32)
Calcium: 7.9 mg/dL — ABNORMAL LOW (ref 8.9–10.3)
Chloride: 113 mmol/L — ABNORMAL HIGH (ref 98–111)
Creatinine, Ser: 1.22 mg/dL (ref 0.61–1.24)
GFR, Estimated: >60 mL/min (ref >60)
Glucose, Bld: 106 mg/dL — ABNORMAL HIGH (ref 70–99)
Potassium: 3.5 mmol/L (ref 3.5–5.1)
Sodium: 139 mmol/L (ref 135–145)

## 2022-01-14 LAB — GLUCOSE, CAPILLARY
Glucose-Capillary: 100 mg/dL — ABNORMAL HIGH (ref 70–99)
Glucose-Capillary: 114 mg/dL — ABNORMAL HIGH (ref 70–99)
Glucose-Capillary: 148 mg/dL — ABNORMAL HIGH (ref 70–99)
Glucose-Capillary: 125 mg/dL — ABNORMAL HIGH (ref 70–99)

## 2022-01-14 MED ORDER — SODIUM CHLORIDE 0.9 % IV SOLN
2.0000 g | Freq: Three times a day (TID) | INTRAVENOUS | Status: DC
  Administered 2022-01-14 – 2022-01-16 (×6): 2 g via INTRAVENOUS
  Filled 2022-01-14: qty 12.5
  Filled 2022-01-14 (×2): qty 2
  Filled 2022-01-14 (×2): qty 12.5
  Filled 2022-01-14 (×2): qty 2

## 2022-01-14 NOTE — Progress Notes (Signed)
Physical Therapy Treatment Patient Details Name: [name] MRN: [id number] DOB: [birth year] Today's Date: 01/14/2022   History of Present Illness [name] is a 81yoM who comes to [facility] on 01/10/22 from [place] [facility] ALF with 3d AMS. Current Left toe and calf wounds. PMH: HTN, HLD, COPD, GERD, depression BPD, schizophrenia, seizure d/o, DVT not on AC, dCHF, C diff, BPH, CKD3a. Pt admitted with LLE cellulitis, also (+) UTI. Per 2017 PT notes baseline mobility includes WC at ALF and physical assist for transfers, modA+1 for bed mobility and transfers.    PT Comments    Pt already starting to eat breakfast upon entry, a nice looking omelette with maybe green beans, but he is poorly positioned in bed to do so. Pt agreeable to PT assistance for set up in bed to optimize independence in feeding, postural control for meal, optimize trunk/head angle for swallowing. Pt requirs a lot of assistance for scooting up in bed (totalA +2), still weak with leg movements. His trunk control and use of arms for feeding requires concerted effort and slow motor patterns, but is successful. Trunk at 50 degrees for exit. Then returned an hour alter after pt calling out for assist "leaning back," left at 30 degrees to optimize pulmonary toilet. Will return later in day to see if pt is up for more therapy and trying EOB again.  *Helped set up tray for patient, including sugaring oatmeal and preparing thickened coffee with cream and sugar. Left with NA in room.   Recommendations for follow up therapy are one component of a multi-disciplinary discharge planning process, led by the attending physician.  Recommendations may be updated based on patient status, additional functional criteria and insurance authorization.  Follow Up Recommendations  Home health PT (HHPT at ALF)     Assistance Recommended at Discharge Frequent or constant Supervision/Assistance  Patient can return home with the following Two people to help  with bathing/dressing/bathroom;Two people to help with walking and/or transfers;Direct supervision/assist for medications management;Assistance with cooking/housework;Assist for transportation;Assistance with feeding   Equipment Recommendations  None recommended by PT    Recommendations for Other Services       Precautions / Restrictions Precautions Precautions: Fall Restrictions Weight Bearing Restrictions: No     Mobility  Bed Mobility Overal bed mobility: Needs Assistance             General bed mobility comments: total assist for scooting up in bed    Transfers                        Ambulation/Gait                   Stairs             Wheelchair Mobility    Modified Rankin (Stroke Patients Only)       Balance                                            Cognition Arousal/Alertness: Awake/alert Behavior During Therapy: WFL for tasks assessed/performed Overall Cognitive Status: Within Functional Limits for tasks assessed                                          Exercises  General Comments        Pertinent Vitals/Pain      Home Living                          Prior Function            PT Goals (current goals can now be found in the care plan section) Acute Rehab PT Goals Patient Stated Goal: prevent decline, return to baseline bed to [facility] transfers PT Goal Formulation: With patient Time For Goal Achievement: 01/26/22 Potential to Achieve Goals: Good Progress towards PT goals: Progressing toward goals;Not progressing toward goals - comment    Frequency    Min 2X/week      PT Plan Current plan remains appropriate    Co-evaluation              AM-PAC PT "6 Clicks" Mobility   Outcome Measure  Help needed turning from your back to your side while in a flat bed without using bedrails?: Total Help needed moving from lying on your back to sitting on the  side of a flat bed without using bedrails?: Total Help needed moving to and from a bed to a chair (including a wheelchair)?: Total Help needed standing up from a chair using your arms (e.g., wheelchair or bedside chair)?: Total Help needed to walk in hospital room?: Total Help needed climbing 3-5 steps with a railing? : Total 6 Click Score: 6    End of Session   Activity Tolerance: Patient tolerated treatment well;Patient limited by pain Patient left: in bed;with call bell/phone within reach;with bed alarm set Nurse Communication: Mobility status PT Visit Diagnosis: Muscle weakness (generalized) (M62.81);Unsteadiness on feet (R26.81);Other symptoms and signs involving the nervous system (R29.898)     Time: 2202-5427 PT Time Calculation (min) (ACUTE ONLY): 18 min  Charges:  $Therapeutic Activity: 8-22 mins                    1:24 PM, 01/14/22 Rosamaria Lints, PT, DPT Physical Therapist - [facility]  [phone] (ASCOM)   Al Gagen C 01/14/2022, 1:18 PM

## 2022-01-14 NOTE — Plan of Care (Signed)

## 2022-01-14 NOTE — Progress Notes (Signed)
PROGRESS NOTE    ZAE KIRTZ  ASN:[id number] DOB: 1941/04/01 DOA: 01/10/2022 PCP: Merlene Laughter, MD    Brief Narrative:  Taken from H&P.   [name] is a 81 y.o. 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 Patient was unable to provide meaningful history and admitting provider was unable to reach family despite multiple attempts. During admission patient is confused, knows his own name, knows that he is in hospital, but is confused about time.  He moves all extremities.  No facial droop or slurred speech.  He has pressure ulcers in both heels.  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  The left lower leg and foot are erythematous and tender.   Data Reviewed and ED Course: pt was found to have WBC 18.5, BMP 550, lactic acid 0.9, INR 1.3, positive urinalysis (cloudy appearance, moderate amount of leukocyte, many bacteria, WBC> 50), negative COVID PCR, CK level 88, renal function close to baseline, potassium 2.2, temperature 99.7, blood pressure 132/74, RR 22, HR 83, oxygen saturation 99% on room air.  Chest x-ray negative.  VBG with pH 7.43, CO2 43. CT head negative for acute intracranial abnormalities.  Left foot x-rays showed no evidence of osteomyelitis   EKG: I have personally reviewed.  Sinus rhythm, PVC, QTc 388, nonspecific T wave change.   Patient met sepsis criteria with fever of 100.8, leukocytosis at 18.5, tachypnea.  UA look infected, history of positive urine cultures of VRE.  Blood and urine cultures pending. Also concern of left lower extremity cellulitis.  He was started on cefepime and vancomycin.   6/5: Procalcitonin elevated at 1.09, troponin  at 40, BNP 530, elevated ESR at 111 and CRP 19.4 Slight decrease in leukocytosis to 15.4 but all cell lines decreased.  Received about 1 L of fluid. Significant hypokalemia with normal magnesium. Prevana boot were placed.  Urine culture with multiple species.  Blood cultures remain negative.   6/6: Blood cultures remain negative and urine cultures with multiple species. Still significant lower extremity edema requiring IV Lasix.  Good urinary output.  Renal functions with slight improvement  6/7 no complaints this am. No overnight issues 6/8 no overnight issues  Consultants:    Procedures:   Antimicrobials:      Subjective: No complaints. No sob  Objective: Vitals:   01/13/22 1208 01/13/22 1545 01/13/22 1957 01/14/22 0358  BP: (!) 122/56 128/64 119/62 118/60  Pulse: 60 63 63 66  Resp: _0 Temp: (!) 97.5 F (36.4 C) (!) 97.5 F (36.4 C) 97.8 F (36.6 C) 98.1 F (36.7 C)  TempSrc:      SpO2: 100% 100% 92% 98%  Weight:      Height:        Intake/Output Summary (Last 24 hours) at 01/14/2022 1937 Last data filed at 01/13/2022 2202 Gross per 24 hour  Intake 240 ml  Output 1325 ml  Net -1085 ml   Filed Weights   01/10/22 0749  Weight: 116.1 kg    Examination: Calm, NAD Cta no w/r Reg s1/s2 no gallop Soft benign +bs No edema Awake and alert Mood and affect appropriate in current setting     Data Reviewed: I have personally reviewed following labs and imaging  studies  CBC: Recent Labs  Lab 01/10/22 0757 01/11/22 0158 01/13/22 0420  WBC 18.5* 15.4* 17.8*  NEUTROABS 16.1*  --   --   HGB 10.7* 9.4* 10.0*  HCT 33.6* 29.4* 31.8*  MCV 85.3 84.7 86.6  PLT 304 253 510   Basic Metabolic Panel: Recent Labs  Lab 01/10/22 0757 01/11/22 0158 01/11/22 0946 01/12/22 0536 01/13/22 0420 01/14/22 0529  NA 141 142  --  138 137 139  K 2.2* 2.4* 3.2* 3.5 3.3* 3.5  CL 112* 114*  --  110 109 113*  CO2 25 24  --  _0 GLUCOSE 121* 106*  --  212*  190* 106*  BUN 37* 35*  --  38* 44* 45*  CREATININE 1.51* 1.54*  --  1.41* 1.35* 1.22  CALCIUM 7.7* 7.2*  --  7.9* 8.0* 7.9*  MG 1.9 2.2  --   --   --   --    GFR: Estimated Creatinine Clearance: 62.6 mL/min (by C-G formula based on SCr of 1.22 mg/dL). Liver Function Tests: Recent Labs  Lab 01/10/22 0757  AST 88*  ALT 39  ALKPHOS 136*  BILITOT 0.8  PROT 6.6  ALBUMIN 2.1*   No results for input(s): "LIPASE", "AMYLASE" in the last 168 hours. No results for input(s): "AMMONIA" in the last 168 hours. Coagulation Profile: Recent Labs  Lab 01/10/22 0757  INR 1.3*   Cardiac Enzymes: Recent Labs  Lab 01/10/22 0757  CKTOTAL 88   BNP (last 3 results) No results for input(s): "PROBNP" in the last 8760 hours. HbA1C: Recent Labs    01/12/22 0537  HGBA1C 5.8*   CBG: Recent Labs  Lab 01/13/22 0818 01/13/22 1209 01/13/22 1702 01/13/22 2048 01/14/22 0720  GLUCAP 133* 139* 160* 184* 100*   Lipid Profile: No results for input(s): "CHOL", "HDL", "LDLCALC", "TRIG", "CHOLHDL", "LDLDIRECT" in the last 72 hours. Thyroid Function Tests: No results for input(s): "TSH", "T4TOTAL", "FREET4", "T3FREE", "THYROIDAB" in the last 72 hours. Anemia Panel: No results for input(s): "VITAMINB12", "FOLATE", "FERRITIN", "TIBC", "IRON", "RETICCTPCT" in the last 72 hours. Sepsis Labs: Recent Labs  Lab 01/10/22 0757 01/10/22 1914  PROCALCITON 1.09  --   LATICACIDVEN 0.9 1.1    Recent Results (from the past 240 hour(s))  Resp Panel by RT-PCR (Flu A&B, Covid) Anterior Nasal Swab     Status: None   Collection Time: 01/10/22  7:57 AM   Specimen: Anterior Nasal Swab  Result Value Ref Range Status   SARS Coronavirus 2 by RT PCR NEGATIVE NEGATIVE Final    Comment: (NOTE) SARS-CoV-2 target nucleic acids are NOT DETECTED.  The SARS-CoV-2 RNA is generally detectable in upper respiratory specimens during the acute phase of infection. The lowest concentration of SARS-CoV-2 viral copies this  assay can detect is 138 copies/mL. A negative result does not preclude SARS-Cov-2 infection and should not be used as the sole basis for treatment or other patient management decisions. A negative result may occur with  improper specimen collection/handling, submission of specimen other than nasopharyngeal swab, presence of viral mutation(s) within the areas targeted by this assay, and inadequate number of viral copies(<138 copies/mL). A negative result must be combined with clinical observations, patient history, and epidemiological information. The expected result is Negative.  Fact Sheet for Patients:  EntrepreneurPulse.com.au  Fact Sheet for Healthcare Providers:  IncredibleEmployment.be  This test is no t yet approved or cleared by the [place] FDA and  has been authorized for detection and/or diagnosis of SARS-CoV-2 by  FDA under an Emergency Use Authorization (EUA). This EUA will remain  in effect (meaning this test can be used) for the duration of the COVID-19 declaration under Section 564(b)(1) of the Act, 21 U.S.C.section 360bbb-3(b)(1), unless the authorization is terminated  or revoked sooner.       Influenza A by PCR NEGATIVE NEGATIVE Final   Influenza B by PCR NEGATIVE NEGATIVE Final    Comment: (NOTE) The Xpert Xpress SARS-CoV-2/FLU/RSV plus assay is intended as an aid in the diagnosis of influenza from Nasopharyngeal swab specimens and should not be used as a sole basis for treatment. Nasal washings and aspirates are unacceptable for Xpert Xpress SARS-CoV-2/FLU/RSV testing.  Fact Sheet for Patients: EntrepreneurPulse.com.au  Fact Sheet for Healthcare Providers: IncredibleEmployment.be  This test is not yet approved or cleared by the [place] FDA and has been authorized for detection and/or diagnosis of SARS-CoV-2 by FDA under an Emergency Use Authorization (EUA). This EUA will  remain in effect (meaning this test can be used) for the duration of the COVID-19 declaration under Section 564(b)(1) of the Act, 21 U.S.C. section 360bbb-3(b)(1), unless the authorization is terminated or revoked.  Performed at [facility], [address] [place], [place] [address]   Blood Culture (routine x 2)     Status: None (Preliminary result)   Collection Time: 01/10/22  7:57 AM   Specimen: BLOOD  Result Value Ref Range Status   Specimen Description BLOOD BLOOD RIGHT FOREARM  Final   Special Requests   Final    BOTTLES DRAWN AEROBIC AND ANAEROBIC Blood Culture results may not be optimal due to an excessive volume of blood received in culture bottles   Culture   Final    NO GROWTH 4 DAYS Performed at [facility], [address], [place], [place] [address]    Report Status PENDING  Incomplete  Blood Culture (routine x 2)     Status: None (Preliminary result)   Collection Time: 01/10/22  7:57 AM   Specimen: BLOOD  Result Value Ref Range Status   Specimen Description BLOOD LEFT ARM  Final   Special Requests   Final    BOTTLES DRAWN AEROBIC AND ANAEROBIC Blood Culture adequate volume   Culture   Final    NO GROWTH 4 DAYS Performed at [facility], [address], [place], [place] [address]    Report Status PENDING  Incomplete  Urine Culture     Status: Abnormal   Collection Time: 01/10/22  7:57 AM   Specimen: In/Out Cath Urine  Result Value Ref Range Status   Specimen Description   Final    IN/OUT CATH URINE Performed at [facility], [address], [place], [place] [address]    Special Requests   Final    NONE Performed at [facility], [address], [place], [place] [address]    Culture MULTIPLE SPECIES PRESENT, SUGGEST RECOLLECTION (A)  Final   Report Status 01/11/2022 FINAL  Final  MRSA Next Gen by PCR, Nasal     Status: None   Collection Time: 01/10/22  1:42 PM   Specimen: Nasal Mucosa; Nasal Swab  Result  Value Ref Range Status   MRSA by PCR Next Gen NOT DETECTED NOT DETECTED Final    Comment: (NOTE) The GeneXpert MRSA Assay (FDA approved for NASAL specimens only), is one component of a comprehensive MRSA colonization surveillance program. It is not intended to diagnose MRSA infection nor to guide or monitor treatment for MRSA infections. Test performance  is not FDA approved in patients less than 81 years old. Performed at [facility], [place], [place] [address]   C Difficile Quick Screen w PCR reflex     Status: None   Collection Time: 01/11/22  7:24 PM   Specimen: STOOL  Result Value Ref Range Status   C Diff antigen NEGATIVE NEGATIVE Final   C Diff toxin NEGATIVE NEGATIVE Final   C Diff interpretation No C. difficile detected.  Final    Comment: Performed at [facility], [address], [place], [place] [address]         Radiology Studies: DG Swallowing [facility] Pathology  Result Date: 01/13/2022 Table formatting from the original result was not included. Objective Swallowing Evaluation: Type of Study: MBS-Modified Barium Swallow Study  Patient Details Name: [name] MRN: [id number] Date of Birth: Dec 11, 1940 Today's Date: 01/13/2022 Time: SLP Start Time (ACUTE ONLY): 4944 -SLP Stop Time (ACUTE ONLY): 9675 SLP Time Calculation (min) (ACUTE ONLY): 30 min Past Medical History: Past Medical History: Diagnosis Date  Bipolar 1 disorder ([place])   C. difficile diarrhea 05/2016  history of ...  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     Patient has altered mental status, cannot provide accurate medical history.  I have tried to call his family without success.       Per report, patient has been confused in the past several days.  When I saw patient in the emergency room, patient is confused, knows his own name, knows that he is in hospital, but is confused about time.  He moves all extremities.  No facial droop or slurred speech.  He has pressure ulcers in both heels.  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  The left lower leg and foot are erythematous and tender.  His body temperature 99.7.  Patient does not have active respiratory distress, cough, nausea, vomiting and diarrhea.  Not sure if patient has symptoms of UTI.   CXR at admit: No active disease.  Subjective: awake, alert. Verbal though min slow to respond verbally as well as slower motor movements intermittently - noted dxs of depression, bipolar disorder, schizophrenia, seizure in H&P  Recommendations for follow up therapy are one component of a multi-disciplinary discharge planning process, led by the attending physician.  Recommendations may be updated based on  patient status,  additional functional criteria and insurance authorization. Assessment / Plan / Recommendation   01/13/2022   2:43 PM Clinical Impressions Clinical Impression Pt presents with mild oral phase and moderate pharyngeal phase dysphagia resulting in silent aspiration of thin liquids via spoon and cup as well as nectar thick liquids via spoon and cup.     When consuming thin liquids, nectar thick liquids and honey thick liquids via spoon and cup as well as puree and graham cracker with barium pt's oral phase is c/b premature spillage of boluses d/t decreased bolus cohesion.     Pt's moderate pharyngeal dysphagia is c/b delayed swallow initiation resulting in gross silent aspiration before and during the swallow when consuming thin liquids via spoon and cup as well as nectar thick liquids via spoon and cup. Pt with 1 instance of coughing but this was very delayed with aspirates deep within trachea at that point. Pt's swallow initiation was more timely when consuming honey thick liquids via spoon and cup as well as puree and graham cracker. Attempted having pt orally hold thinner consistencies but pt unable to perform compensatory strategy. At this time, safet diet recommend would be dysphagia 3 with honey thick liquids via cup, medicine whole in puree with strict aspiration precautions. SLP Visit Diagnosis Dysphagia, oropharyngeal phase (R13.12) Impact on safety and function Severe aspiration risk;Risk for inadequate nutrition/hydration     01/13/2022   2:43 PM Treatment Recommendations Treatment Recommendations Therapy as outlined in treatment plan below     01/13/2022   2:43 PM Prognosis Prognosis for Safe Diet Advancement Guarded Barriers to Reach Goals Time post onset;Severity of deficits Barriers/Prognosis Comment unsure of full Cognitive status premorbid   01/13/2022   2:43 PM Diet Recommendations SLP Diet Recommendations Dysphagia 3 (Mech soft) solids;Honey thick liquids Liquid Administration via Cup Medication  Administration Whole meds with puree Compensations Minimize environmental distractions;Slow rate;Small sips/bites;Lingual sweep for clearance of pocketing;Follow solids with liquid Postural Changes Seated upright at 90 degrees     01/13/2022   2:43 PM Other Recommendations Oral Care Recommendations Oral care BID Other Recommendations Order thickener from pharmacy;Prohibited food (jello, ice cream, thin soups);Remove water pitcher;Have oral suction available Follow Up Recommendations Skilled nursing-short term rehab (<3 hours/day) Assistance recommended at discharge Set up Supervision/Assistance Functional Status Assessment Patient has had a recent decline in their functional status and demonstrates the ability to make significant improvements in function in a reasonable and predictable amount of time.   01/13/2022   2:43 PM Frequency and Duration  Speech Therapy Frequency (ACUTE ONLY) min 3x week Treatment Duration 2 weeks     01/13/2022   2:43 PM Oral Phase Oral Phase Impaired Oral - Honey Teaspoon WFL Oral - Honey Cup WFL Oral - Nectar Teaspoon Premature spillage;Decreased bolus cohesion;Weak lingual manipulation;Reduced posterior propulsion Oral - Nectar Cup Weak lingual manipulation;Reduced posterior propulsion;Decreased bolus cohesion;Premature spillage Oral - Thin Teaspoon Weak lingual manipulation;Reduced posterior propulsion;Premature spillage;Decreased bolus cohesion Oral - Thin Cup Weak lingual manipulation;Reduced posterior propulsion;Decreased bolus cohesion;Premature spillage Oral - Puree Weak lingual manipulation;Reduced posterior propulsion;Decreased bolus cohesion;Premature spillage Oral - Mech Soft Weak lingual manipulation    01/13/2022   2:43 PM Pharyngeal Phase Pharyngeal Phase Impaired Pharyngeal- Honey Teaspoon Delayed swallow initiation-vallecula Pharyngeal- Honey Cup Delayed swallow initiation-vallecula Pharyngeal- Nectar Teaspoon Delayed swallow initiation-pyriform sinuses;Reduced airway/laryngeal  closure;Penetration/Aspiration before swallow;Penetration/Aspiration during swallow;Moderate aspiration Pharyngeal Material enters airway, passes BELOW cords without attempt by patient to eject out (silent aspiration) Pharyngeal- Nectar Cup Delayed swallow initiation-pyriform sinuses;Reduced airway/laryngeal closure;Penetration/Aspiration before  swallow;Penetration/Aspiration during swallow;Moderate aspiration Pharyngeal Material enters airway, passes BELOW cords without attempt by patient to eject out (silent aspiration) Pharyngeal- Thin Teaspoon Delayed swallow initiation-pyriform sinuses;Penetration/Aspiration before swallow;Penetration/Aspiration during swallow;Moderate aspiration Pharyngeal Material enters airway, passes BELOW cords without attempt by patient to eject out (silent aspiration) Pharyngeal- Thin Cup Delayed swallow initiation-pyriform sinuses;Reduced airway/laryngeal closure;Penetration/Aspiration before swallow;Penetration/Aspiration during swallow;Moderate aspiration Pharyngeal Material enters airway, passes BELOW cords without attempt by patient to eject out (silent aspiration) Pharyngeal- Puree Delayed swallow initiation-vallecula Pharyngeal- Mechanical Soft Delayed swallow initiation-vallecula    01/13/2022   2:43 PM Cervical Esophageal Phase  Cervical Esophageal Phase [facility] Happi Overton 01/13/2022, 4:10 PM                          Scheduled Meds:  acidophilus  1 capsule Oral Daily   ARIPiprazole  20 mg Oral q morning   vitamin C  500 mg Oral BID   buPROPion  300 mg Oral Daily   Chlorhexidine Gluconate Cloth  6 each Topical Daily   cholecalciferol  1,000 Units Oral q morning   enoxaparin (LOVENOX) injection  0.5 mg/kg Subcutaneous Q24H   feeding supplement (NEPRO CARB STEADY)  237 mL Oral BID BM   ferrous sulfate  325 mg Oral Q breakfast   finasteride  5 mg Oral Daily   folic acid  1 mg Oral q morning   insulin aspart  0-5 Units Subcutaneous QHS   insulin aspart  0-9 Units  Subcutaneous TID WC   loratadine  10 mg Oral Daily   midodrine  10 mg Oral BID WC   multivitamin with minerals  1 tablet Oral Daily   PHENobarbital  64.8 mg Oral QODAY   potassium chloride  40 mEq Oral Once   pravastatin  40 mg Oral Daily   zinc sulfate  220 mg Oral Daily   Continuous Infusions:  ceFEPime (MAXIPIME) IV 2 g (01/14/22 0237)   vancomycin 1,000 mg (01/13/22 1738)    Assessment & Plan:   Principal Problem:   Cellulitis of left lower extremity Active Problems:   UTI (urinary tract infection)   Sepsis (HCC)   Acute metabolic encephalopathy   Chronic diastolic CHF (congestive heart failure) (HCC)   COPD (chronic obstructive pulmonary disease) (HCC)   Seizure disorder (HCC)   Chronic kidney disease, stage 3a (HCC)   Depression   Hypertension   Mixed hyperlipidemia   BPH (benign prostatic hyperplasia)   Normocytic anemia   Hypokalemia   Hypomagnesemia   Dysphagia   Cellulitis of left lower extremity Pt has left lower extremity cellulitis in left foot and lower leg.  - Empiric antimicrobial treatment with vancomycin and cefepime (patient received 1 dose of Flagyl in ED) - Blood cultures x 2 -negative so far - ESR and CRP-significantly elevated with ESR of 111 and CRP of 19.4 - wound care consult-appreciate their recommendations 6/8 continue iv abx       UTI (urinary tract infection) UA positive for UTI. Pt has hx of positive culture of VRE.  Urine cultures with multiple species. 6/8 on abx as above   Sepsis ([place]) Sepsis due to UTI and cellulitis of left lower extremity:  Continue abx    Acute metabolic encephalopathy Possibly due to UTI.  CT head negative. 6/8 improved.     Chronic diastolic CHF (congestive heart failure) ([place]) 2D echo on 10/12/2018 showed EF> 55%.  Patient has bilateral leg edema. no worsening shortness of breath.  Oxygen saturation 99% on  room air.  Patient does not seem to have acute CHF exacerbation.  His BNP is elevated 550,  obviously pt is at risk of developing CHF exacerbation. Use lasix once renal function stable.   COPD (chronic obstructive pulmonary disease) (HCC) Stable -Bronchodilators   Seizure disorder ([place]) -Seizure precaution -As needed Ativan for seizure -Continue home phenobarbital   Chronic kidney disease, stage 3a (HCC) Recent baseline creatinine 1.0-1.4.  His creatinine is 1.51, BUN 37, close to baseline. -Follow-up by BMP   Depression - Continue home medications   Hypertension - IV hydralazine as needed -Hold amlodipine due to sepsis and risk of developing hypotension -Coreg   Mixed hyperlipidemia - Pravastatin   BPH (benign prostatic hyperplasia) - Proscar   Normocytic anemia Hemoglobin stable, 10.7 today (10.1 on 04/11/2021) -Follow-up with CBC   Hypomagnesemia Resolved. Mg 1.5>>2.2     Hypokalemia Resolved.  Potassium at 3.5 today -Continue to monitor and replete as needed-patient is being diuresed       Dysphagia Swallow evaluation was obtained with nursing concern of coughing with food.  They are recommending nectar thick and dysphagia diet. -Continue with dysphagia diet with nectar thick liquid as recommended.   DVT prophylaxis: lovenox Code Status:full Family Communication: none at bedside Disposition Plan:  Status is: Inpatient Remains inpatient appropriate because: iv treatment.         LOS: 4 days   Time spent: 35 min     Nolberto Hanlon, MD [facility] Pager [phone]  If 7PM-7AM, please contact night-coverage 01/14/2022, 8:32 AM

## 2022-01-14 NOTE — Progress Notes (Addendum)
Speech Language Pathology Treatment: Dysphagia  Patient Details Name: [name] MRN: [id number] DOB: 06/02/1941 Today's Date: 01/14/2022 Time: 7628-3151 SLP Time Calculation (min) (ACUTE ONLY): 35 min  Assessment / Plan / Recommendation Clinical Impression  Pt seen for toleration of diet and education of MBSS results post MBSS yesterday which revealed "mild oral phase and moderate pharyngeal phase dysphagia resulting in silent aspiration of thin liquids via spoon and cup as well as nectar thick liquids via spoon and cup. Pt's swallow initiation was more timely when consuming honey thick liquids via spoon and cup as well as puree and graham cracker. Attempted having pt orally hold thinner consistencies but pt unable to perform compensatory strategy.".  A dysphagia level 3 (mech soft) w/ Honey consistency liquids was recommended post the MBSS results.   Pt was sitting upright in bed upon entering room. Min support behind head for head forward position needed. Setup was provided and pt fed himself consuming Honey liquids and Magic Cup supplement ice cream. Noted same slightly slower motor/verbal responses overall -- unsure of baseline Cognitive status(also in setting of illness, medical and pschiatric dxs, medications).   Pt exhibited delayed, overt clinical s/s of aspiration noted w/ trials c/b delayed throat clearing and cough intermittently -- it was not consistency post each bolus. No decline in respiratory presentation noted during/post trials overall; strong cough. Clear vocal quality noted b/t trials. Concern for laryngeal penetration/aspiration of pharyngeal residue BETWEEN trials. Oral phase was c/b min slow, deliberate bolus management and oral clearing of all boluses given. He required increased Time for mastication d/t No bottom Dentition. A-P transfer was adequate w/ trials; oral clearing achieved w/ all consistencies.  Education provided on aspiration precautions including using SINGLE  SMALL SIPS when drinking liquids: noted pt tended to drink w/ consecutive sips, keeping Cup at his mouth. Encouraged pt to clear mouth fully b/t bites/sips and use extra swallows to clear his throat of any residue as needed. Pt demonstrated follow through given verbal cues.   Pt appears to present w/ oropharyngeal phase dysphagia w/ suspected Neuromuscular deficits -- see results of MBSS. Also noted impact from having No Bottom Dentition (lower Denture plate at "home" per pt report; wearing only the Upper Denture plate) on mastication of soft solids; increased Time needed for full chewing/mashing of foods b/f swallowing.    In setting of degree of pharyngeal phase dysphagia, increased risk for aspiration/aspiration pneumonia, hospitalization w/ concern for sedentary status secondary to illness, and assessed aspiration of thin/Nectar liquids via MBSS, recommend continue the dysphagia level 3 diet(minced meats) w/ Honey consistency liquids via CUP; aspiration precautions to include strong throat clear/cough intermittently b/t bites/sips to clear any potential pharyngeal-laryngeal bolus residue BETWEEN trials; Pills Whole in puree for safety; tray setup at meals; Supervision/monitoring at meals as needed, reduce Distractions during meals. Precautions posted in room.  Discussed concern for meeting hydration needs w/ the thickened liquids w/ MD. Recommended a Palliative Care consult for discussion of GOC overall for discharge. NSG/MD updated. Precautions posted. MD to reconsult ST services if new swallowing needs arise during this admit. Recommend f/u w/ services at next venue of care for education/tx w/ ongoing discussion re: [place] in setting of dysphagia and risk for aspiration pneumonia. CM updated.       HPI HPI: Pt is a 81 y.o. 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     Patient has altered mental status, cannot provide accurate medical history.  I have tried to call his family without success.       Per report, patient has been confused in the past several days.  When I saw patient in the emergency room, patient is confused, knows his own name, knows that he is in hospital, but is confused about time.  He moves all extremities.  No facial droop or slurred speech.  He has pressure ulcers in both heels.  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  The left lower leg and foot are erythematous and tender.  His body temperature 99.7.  Patient does not have active respiratory distress, cough, nausea, vomiting and diarrhea.  Not sure if patient has symptoms of UTI.   CXR at admit: No active disease.   01/13/2022: MBSS completed; see results.      SLP Plan  All goals met      Recommendations for follow up therapy are one component of a multi-disciplinary discharge planning process, led by the attending physician.  Recommendations may be updated based on patient status, additional functional criteria and insurance authorization.    Recommendations  Diet recommendations: Dysphagia 3 (mechanical soft);Honey-thick liquid Liquids provided via: Cup;No straw Medication Administration: Whole meds with puree Supervision: Patient able to self feed;Intermittent supervision to cue for compensatory strategies (setup and positioning support) Compensations: Minimize environmental distractions;Slow rate;Small sips/bites;Lingual sweep for clearance of pocketing;Follow solids with liquid Postural Changes and/or Swallow Maneuvers: Out of bed for meals;Seated upright 90 degrees;Upright 30-60 min after meal                General recommendations:  (Palliative Care consult; Dietician f/u) Oral Care  Recommendations: Oral care BID;Oral care before and after PO;Staff/trained caregiver to provide oral care ([facility] care) Follow Up Recommendations: Skilled nursing-short term rehab (<3 hours/day) Assistance recommended at discharge: Set up Supervision/Assistance SLP Visit Diagnosis: Dysphagia, oropharyngeal phase (R13.12) Plan: All goals met             Orinda Kenner, [place], [place]; [place] [phone] (ascom) [place]ar  01/14/2022, 2:47 PM

## 2022-01-14 NOTE — Progress Notes (Signed)
PT Cancellation Note  Patient Details Name: [name] MRN: [id number] DOB: [birth year]   Cancelled Treatment:    Reason Eval/Treat Not Completed: Fatigue/lethargy limiting ability to participate Environmental education officer stopped by for 2nd session. Pt asking about lunch which is not here yet. Pt asking to be reclined in bed due to back pain, has been upright since OT session.) Coordinated with RN for getting a lunch tray up here. Pt adjusted to comfort, left pt at 30 degrees HOB. Pt reports his back hurts and he is tired. Will resume services again at later date/time.   2:31 PM, 01/14/22 Rosamaria Lints, PT, DPT Physical Therapist - [facility]  [phone] (ASCOM)    [name] 01/14/2022, 2:28 PM

## 2022-01-15 DIAGNOSIS — L03116 Cellulitis of left lower limb: Secondary | ICD-10-CM | POA: Diagnosis not present

## 2022-01-15 LAB — GLUCOSE, CAPILLARY
Glucose-Capillary: 123 mg/dL — ABNORMAL HIGH (ref 70–99)
Glucose-Capillary: 131 mg/dL — ABNORMAL HIGH (ref 70–99)
Glucose-Capillary: 127 mg/dL — ABNORMAL HIGH (ref 70–99)
Glucose-Capillary: 175 mg/dL — ABNORMAL HIGH (ref 70–99)

## 2022-01-15 LAB — CULTURE, BLOOD (ROUTINE X 2)
Culture: NO GROWTH
Culture: NO GROWTH
Special Requests: ADEQUATE

## 2022-01-15 LAB — CREATININE, SERUM
Creatinine, Ser: 1.22 mg/dL (ref 0.61–1.24)
GFR, Estimated: >60 mL/min (ref >60)

## 2022-01-15 MED ORDER — VANCOMYCIN HCL 1250 MG/250ML IV SOLN
1250.0000 mg | INTRAVENOUS | Status: DC
  Administered 2022-01-15: 1250 mg via INTRAVENOUS
  Filled 2022-01-15 (×2): qty 250

## 2022-01-15 NOTE — TOC Progression Note (Addendum)
Transition of Care [facility]) - Progression Note    Patient Details  Name: [name] MRN: [id number] Date of Birth: [birth year]  Transition of Care [facility]) CM/SW Contact  Liliana Cline, LCSW Phone Number: 01/15/2022, 1:14 PM  Clinical Narrative:    Per rounds there is concern if patient is appropriate to return to ALF or if he needs SNF for short term rehab. PT reevaluated and stated patient needs + 3 assist to [facility] and hoyer lift for transfers to wheelchair. CSW called and spoke with Doristine Mango Sales promotion account executive of group home) as well as Delorise Shiner (caregiver at group home). They stated they cannot provide +3 assist or hoyer lift transfers. They stated at baseline patient was +1 assist. They reported patient will need SNF at this time. SNF workup was started by [facility] earlier in admissions. CSW called Doristine Mango back to see who the family contact person is. Only contact listed is [place] (previous group home owner). Doristine Mango is checking and will call CSW back. PASRR pending. Uploaded additional info.   2:18- Call from [place] who stated patient doesn't have a friend/family contact and is his own representative. Spoke to patient. Provided Medicare ratings of his 2 bed offers ([facility] and [facility]). He said he is ok with going to [facility] . Updated [facility] at [facility].  2:50- Notified [facility] at [facility] that per MD, patient should be ready for DC to [facility] tomorrow if PAS[facility] is back.Uploaded 30 day note to [place].   3:12- PASRR is [phone] E. Tammy confirmed patient can come to [facility] tomorrow.  Expected Discharge Plan: (P) Home w Home Health Services Barriers to Discharge: (P) Barriers Resolved  Expected Discharge Plan and Services Expected Discharge Plan: (P) Home w Home Health Services   Discharge Planning Services: (P) CM Consult Post Acute Care Choice: (P) Home Health Living arrangements for the past 2 months: (P) Group Home                 DME Arranged: (P) [place] rolling DME Agency: (P) [employer] Date DME  Agency Contacted: (P) 01/12/22 Time DME Agency Contacted: (P) 1512 Representative spoke with at DME Agency: (P) [facility] HH Arranged: (P) PT           Social Determinants of Health (SDOH) Interventions    Readmission Risk Interventions     No data to display

## 2022-01-15 NOTE — Progress Notes (Signed)
Physical Therapy Treatment Patient Details Name: [name] MRN: [id number] DOB: 1940/09/08 Today's Date: 01/15/2022   History of Present Illness [name] is a 81yoM who comes to [facility] on 01/10/22 from [place] Years ALF with 3d AMS. Current Left toe and calf wounds. PMH: HTN, HLD, COPD, GERD, depression BPD, schizophrenia, seizure d/o, DVT not on AC, dCHF, C diff, BPH, CKD3a. Pt admitted with LLE cellulitis, also (+) UTI. Per 2017 PT notes baseline mobility includes WC at ALF and physical assist for transfers, modA+1 for bed mobility and transfers.    PT Comments    Author + NA in room to assist with OOB to [facility] for BM. Pt has been calling out much of morning reporting urge to void bowel. MaxA+2 to EOB where pt is able to steady self. Space boot removed and grip socks donned. First attempt to STS from elevated surface no indicative of safe pivot transfer to bari [facility], however with maxA+2, pt is able to squat pivot to drop-arm BSC. He is unable to pass much on commode, only clear translucent gel. Pt assisted back to bed MaxA+2, then back to supine to reclined with feet elevated and space boots donned. RN made aware of jelly poop. Pt cooperative and motivated, but is quite weak and still far from baseline for strength.    *BSC post BM       Recommendations for follow up therapy are one component of a multi-disciplinary discharge planning process, led by the attending physician.  Recommendations may be updated based on patient status, additional functional criteria and insurance authorization.  Follow Up Recommendations  Skilled nursing-short term rehab (<3 hours/day)     Assistance Recommended at Discharge Frequent or constant Supervision/Assistance  Patient can return home with the following Two people to help with bathing/dressing/bathroom;Two people to help with walking and/or transfers;Direct supervision/assist for medications management;Assistance with cooking/housework;Assist for  transportation;Help with stairs or ramp for entrance;Assistance with feeding   Equipment Recommendations  None recommended by PT    Recommendations for Other Services       Precautions / Restrictions Precautions Precautions: Fall Restrictions Weight Bearing Restrictions: No     Mobility  Bed Mobility Overal bed mobility: Needs Assistance Bed Mobility: Supine to Sit, Sit to Supine     Supine to sit: Max assist, +2 for physical assistance Sit to supine: +2 for physical assistance, Total assist   General bed mobility comments: total assist for scooting up in bed +2    Transfers Overall transfer level: Needs assistance Equipment used: 1 person hand held assist Transfers: Sit to/from Stand, Bed to chair/wheelchair/BSC Sit to Stand: Max assist, +2 safety/equipment     Squat pivot transfers: Max assist, +2 safety/equipment     General transfer comment: never able to fully rise despite multiple attempts    Ambulation/Gait Ambulation/Gait assistance:  (does not ambulate at baseline)                 Stairs             Wheelchair Mobility    Modified Rankin (Stroke Patients Only)       Balance Overall balance assessment: Needs assistance   Sitting balance-Leahy Scale: Good                                      Cognition Arousal/Alertness: Awake/alert Behavior During Therapy: WFL for tasks assessed/performed, Restless, Flat affect Overall Cognitive Status:  Within Functional Limits for tasks assessed                                          Exercises Other Exercises Other Exercises: attempted STS from elevated EOB MaxA, twice, not able to fully rise Other Exercises: MaxA+2 Squat pivot transfer x3 for toileting needs    General Comments        Pertinent Vitals/Pain Pain Assessment Pain Assessment: No/denies pain    Home Living                          Prior Function            PT Goals  (current goals can now be found in the care plan section) Acute Rehab PT Goals Patient Stated Goal: prevent decline, return to baseline bed to [facility] transfers PT Goal Formulation: With patient Time For Goal Achievement: 01/26/22 Potential to Achieve Goals: Good Progress towards PT goals: Not progressing toward goals - comment    Frequency    Min 2X/week      PT Plan Current plan remains appropriate    Co-evaluation              AM-PAC PT "6 Clicks" Mobility   Outcome Measure  Help needed turning from your back to your side while in a flat bed without using bedrails?: Total Help needed moving from lying on your back to sitting on the side of a flat bed without using bedrails?: Total Help needed moving to and from a bed to a chair (including a wheelchair)?: Total Help needed standing up from a chair using your arms (e.g., wheelchair or bedside chair)?: Total Help needed to walk in hospital room?: Total Help needed climbing 3-5 steps with a railing? : Total 6 Click Score: 6    End of Session   Activity Tolerance: Patient tolerated treatment well;Patient limited by pain Patient left: in bed;with call bell/phone within reach;with bed alarm set Nurse Communication: Mobility status PT Visit Diagnosis: Muscle weakness (generalized) (M62.81);Unsteadiness on feet (R26.81);Other symptoms and signs involving the nervous system (R29.898)     Time: 1105-1130 PT Time Calculation (min) (ACUTE ONLY): 25 min  Charges:  $Therapeutic Activity: 23-37 mins                    1:01 PM, 01/15/22 Rosamaria Lints, PT, DPT Physical Therapist - [facility]  [phone] (ASCOM)   Leslie Jester C 01/15/2022, 12:56 PM

## 2022-01-15 NOTE — Plan of Care (Signed)

## 2022-01-15 NOTE — Consult Note (Signed)
Pharmacy Antibiotic Note  [name] is a 81 y.o. male with medical history including C diff, VRE, urinary retention with chronic indwelling foley, CHF, COPD admitted on 01/10/2022 with  left leg cellulitis and UTI .  Pharmacy has been consulted for cefepime and vancomycin dosing.  Plan 01/15/22: Cefepime increased to 2 g IV q8h per indication and renal function  Increase vancomycin maintenance dose to 1250 mg Q24H. Goal AUC 400-550 Goal AUC 400-550  Est AUC: 463.7 Est Cmax:32.1 Est Cmin: 11.3 Calculated with SCr 1.22, Vd 0.5  Monitor clinical picture, renal function, and vancomycin levels at steady state F/U C&S, abx deescalation / LOT  Height: 5\' 10"  (177.8 cm) Weight: 116.1 kg (256 lb) IBW/kg (Calculated) : 73  Temp (24hrs), Avg:97.9 F (36.6 C), Min:97.6 F (36.4 C), Max:98.1 F (36.7 C)  Recent Labs  Lab 01/10/22 0757 01/10/22 1914 01/11/22 0158 01/11/22 1605 01/12/22 0536 01/13/22 0420 01/14/22 0529 01/15/22 0440  WBC 18.5*  --  15.4*  --   --  17.8* 11.8*  --   CREATININE 1.51*  --  1.54*  --  1.41* 1.35* 1.22 1.22  LATICACIDVEN 0.9 1.1  --   --   --   --   --   --   VANCORANDOM  --   --   --  10  --   --   --   --      Estimated Creatinine Clearance: 62.6 mL/min (by C-G formula based on SCr of 1.22 mg/dL).    Allergies  Allergen Reactions   Penicillin G Rash and Other (See Comments)    Convulsions.    Has patient had a PCN reaction causing immediate rash, facial/tongue/throat swelling, SOB or lightheadedness with hypotension: not sure Has patient had a PCN reaction causing severe rash involving mucus membranes or skin necrosis: not sure Has patient had a PCN reaction that required hospitalization: not sure Has patient had a PCN reaction occurring within the last 10 years: not sure If all of the above answers are "NO", then may proceed with Cephalosporin use.    Antimicrobials this admission: Metronidazole 6/4 x 1 Cefepime 6/4 >>  Vancomycin 6/4 >>    Dose adjustments this admission: N/A  Microbiology results: 6/4 BCx: pending 6/4 UCx: multiple species 6/4 MRSA PCR: neg  Thank you for allowing pharmacy to be a part of this patient's care.  Darnelle Bos, PharmD Clinical Pharmacist   01/15/2022 1:44 PM

## 2022-01-15 NOTE — Consult Note (Signed)
WOC Nurse Consult Note: Reason for Consult: Request by physician for non-remote consultation. Wound type:Pressure injuries to bilateral heels, DTPI. 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 Stage 2 PI to sacrum. Left posterior LE with full thickness wound.  Pressure Injury POA: Yes Measurement: Left lateral heel DTPI: 3cm round, moist, macerated periwound skin. Right heel DTPI: 3cm x 2cm with moist macerated center and macerated wound periphery. RGT: 2cm x 2.4cm blood filled blister Sacrum: 3cm x 4cm x 0.2cm red, moist Wound bed:As noted above   Dressing procedure/placement/frequency: Wound care is in place from my colleagues remote consultation on 01/11/22. Bilateral Heels are macerated at periphery as autolytic debridement is taking place beneath silicone foam dressings. I have amended those wound care orders in an attempt to stabilize and dry the wounds in this area using a povidone iodine  Application followed by placement of dry dressings. Placement of heels into pressure redistribution heel boots is still necessary. I am adding placement of patient on a mattress replacement with low air loss feature as he is not moving independently and still remains in one place unless prodded to turn and reposition.  WOC nursing team will not follow, but will remain available to this patient, the nursing and medical teams.  Please re-consult if needed. Thanks, Ladona Mow, MSN, RN, GNP, [place]  Pager# [phone]

## 2022-01-15 NOTE — Care Management Important Message (Signed)
Important Message  Patient Details  Name: [name] MRN: [id number] Date of Birth: 30-Sep-1940   Medicare Important Message Given:  Other (see comment)  Patient is in an isolation room so I called to review his Important Message from Medicare by phone [phone]) but there was no answer. Will try again.   Olegario Messier A Jermarion Poffenberger 01/15/2022, 2:35 PM

## 2022-01-15 NOTE — Progress Notes (Signed)
PROGRESS NOTE    [name]  WHQ:[id number] DOB: 05-08-1941 DOA: 01/10/2022 PCP: Merlene Laughter, MD    Brief Narrative:  Taken from H&P.   [name] is a 81 y.o. 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 difficile colitis, kidney [name], BPH, CKD-3A, obesity with BMI 36.7, positive VRE urine culture in past, who presents with altered mental status, left lower leg and foot pain. Patient was unable to provide meaningful history and admitting provider was unable to reach family despite multiple attempts. During admission patient is confused, knows his own name, knows that he is in hospital, but is confused about time.  He moves all extremities.  No facial droop or slurred speech.  He has pressure ulcers in both heels.  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  The left lower leg and foot are erythematous and tender.   Data Reviewed and ED Course: pt was found to have WBC 18.5, BMP 550, lactic acid 0.9, INR 1.3, positive urinalysis (cloudy appearance, moderate amount of leukocyte, many bacteria, WBC> 50), negative COVID PCR, CK level 88, renal function close to baseline, potassium 2.2, temperature 99.7, blood pressure 132/74, RR 22, HR 83, oxygen saturation 99% on room air.  Chest x-ray negative.  VBG with pH 7.43, CO2 43. CT head negative for acute intracranial abnormalities.  Left foot x-rays showed no evidence of osteomyelitis   EKG: I have personally reviewed.  Sinus rhythm, PVC, QTc 388, nonspecific T wave change.   Patient met sepsis criteria with fever of 100.8, leukocytosis at 18.5, tachypnea.  UA look infected, history of positive urine cultures of VRE.  Blood and urine cultures pending. Also concern of left lower extremity cellulitis.  He was started on cefepime and vancomycin.   6/5: Procalcitonin elevated at 1.09, troponin  at 40, BNP 530, elevated ESR at 111 and CRP 19.4 Slight decrease in leukocytosis to 15.4 but all cell lines decreased.  Received about 1 L of fluid. Significant hypokalemia with normal magnesium. Prevana boot were placed.  Urine culture with multiple species.  Blood cultures remain negative.   6/6: Blood cultures remain negative and urine cultures with multiple species. Still significant lower extremity edema requiring IV Lasix.  Good urinary output.  Renal functions with slight improvement  6/7 no complaints this am. No overnight issues 6/9 no overnight issues  Consultants:    Procedures:   Antimicrobials:      Subjective: Denies shortness of breath or chest pain.  Objective: Vitals:   01/14/22 1538 01/14/22 2041 01/15/22 0351 01/15/22 0811  BP: (!) 119/59 (!) 105/55 130/81 134/72  Pulse: 78 78 89 78  Resp: $Remo'20 18 17 16  'yYCkU$ Temp: 97.6 F (36.4 C) 97.6 F (36.4 C) 98 F (36.7 C) 98 F (36.7 C)  TempSrc: Oral   Oral  SpO2: 100% 98% 100% (!) 85%  Weight:      Height:        Intake/Output Summary (Last 24 hours) at 01/15/2022 0818 Last data filed at 01/15/2022 0814 Gross per 24 hour  Intake 880 ml  Output 2925 ml  Net -2045 ml   Filed Weights   01/10/22 0749  Weight: 116.1 kg    Examination: Calm, NAD Cta no w/r Reg s1/s2 no gallop Soft benign +bs +edema b/l not much infectious erythema.  Awake and alert Mood and affect appropriate in current setting     Data Reviewed:  I have personally reviewed following labs and imaging studies  CBC: Recent Labs  Lab 01/10/22 0757 01/11/22 0158 01/13/22 0420 01/14/22 0529  WBC 18.5* 15.4* 17.8* 11.8*  NEUTROABS 16.1*  --   --   --   HGB 10.7* 9.4* 10.0* 9.7*  HCT 33.6* 29.4* 31.8* 31.0*  MCV 85.3 84.7 86.6 85.2  PLT 304 253 397 115   Basic Metabolic Panel: Recent Labs  Lab 01/10/22 0757 01/11/22 0158 01/11/22 0946 01/12/22 0536 01/13/22 0420 01/14/22 0529 01/15/22 0440  NA 141 142  --  138 137 139  --    K 2.2* 2.4* 3.2* 3.5 3.3* 3.5  --   CL 112* 114*  --  110 109 113*  --   CO2 25 24  --  $R'22 23 23  'CX$ --   GLUCOSE 121* 106*  --  212* 190* 106*  --   BUN 37* 35*  --  38* 44* 45*  --   CREATININE 1.51* 1.54*  --  1.41* 1.35* 1.22 1.22  CALCIUM 7.7* 7.2*  --  7.9* 8.0* 7.9*  --   MG 1.9 2.2  --   --   --   --   --    GFR: Estimated Creatinine Clearance: 62.6 mL/min (by C-G formula based on SCr of 1.22 mg/dL). Liver Function Tests: Recent Labs  Lab 01/10/22 0757  AST 88*  ALT 39  ALKPHOS 136*  BILITOT 0.8  PROT 6.6  ALBUMIN 2.1*   No results for input(s): "LIPASE", "AMYLASE" in the last 168 hours. No results for input(s): "AMMONIA" in the last 168 hours. Coagulation Profile: Recent Labs  Lab 01/10/22 0757  INR 1.3*   Cardiac Enzymes: Recent Labs  Lab 01/10/22 0757  CKTOTAL 88   BNP (last 3 results) No results for input(s): "PROBNP" in the last 8760 hours. HbA1C: No results for input(s): "HGBA1C" in the last 72 hours.  CBG: Recent Labs  Lab 01/14/22 0720 01/14/22 1148 01/14/22 1645 01/14/22 2159 01/15/22 0811  GLUCAP 100* 114* 148* 125* 123*   Lipid Profile: No results for input(s): "CHOL", "HDL", "LDLCALC", "TRIG", "CHOLHDL", "LDLDIRECT" in the last 72 hours. Thyroid Function Tests: No results for input(s): "TSH", "T4TOTAL", "FREET4", "T3FREE", "THYROIDAB" in the last 72 hours. Anemia Panel: No results for input(s): "VITAMINB12", "FOLATE", "FERRITIN", "TIBC", "IRON", "RETICCTPCT" in the last 72 hours. Sepsis Labs: Recent Labs  Lab 01/10/22 0757 01/10/22 1914  PROCALCITON 1.09  --   LATICACIDVEN 0.9 1.1    Recent Results (from the past 240 hour(s))  Resp Panel by RT-PCR (Flu A&B, Covid) Anterior Nasal Swab     Status: None   Collection Time: 01/10/22  7:57 AM   Specimen: Anterior Nasal Swab  Result Value Ref Range Status   SARS Coronavirus 2 by RT PCR NEGATIVE NEGATIVE Final    Comment: (NOTE) SARS-CoV-2 target nucleic acids are NOT  DETECTED.  The SARS-CoV-2 RNA is generally detectable in upper respiratory specimens during the acute phase of infection. The lowest concentration of SARS-CoV-2 viral copies this assay can detect is 138 copies/mL. A negative result does not preclude SARS-Cov-2 infection and should not be used as the sole basis for treatment or other patient management decisions. A negative result may occur with  improper specimen collection/handling, submission of specimen other than nasopharyngeal swab, presence of viral mutation(s) within the areas targeted by this assay, and inadequate number of viral copies(<138 copies/mL). A negative result must be combined with clinical observations, patient history, and epidemiological information. The expected result  is Negative.  Fact Sheet for Patients:  BloggerCourse.com  Fact Sheet for Healthcare Providers:  SeriousBroker.it  This test is no t yet approved or cleared by the [place] FDA and  has been authorized for detection and/or diagnosis of SARS-CoV-2 by FDA under an Emergency Use Authorization (EUA). This EUA will remain  in effect (meaning this test can be used) for the duration of the COVID-19 declaration under Section 564(b)(1) of the Act, 21 U.S.C.section 360bbb-3(b)(1), unless the authorization is terminated  or revoked sooner.       Influenza A by PCR NEGATIVE NEGATIVE Final   Influenza B by PCR NEGATIVE NEGATIVE Final    Comment: (NOTE) The Xpert Xpress SARS-CoV-2/FLU/RSV plus assay is intended as an aid in the diagnosis of influenza from Nasopharyngeal swab specimens and should not be used as a sole basis for treatment. Nasal washings and aspirates are unacceptable for Xpert Xpress SARS-CoV-2/FLU/RSV testing.  Fact Sheet for Patients: BloggerCourse.com  Fact Sheet for Healthcare Providers: SeriousBroker.it  This test is not yet  approved or cleared by the [place] FDA and has been authorized for detection and/or diagnosis of SARS-CoV-2 by FDA under an Emergency Use Authorization (EUA). This EUA will remain in effect (meaning this test can be used) for the duration of the COVID-19 declaration under Section 564(b)(1) of the Act, 21 U.S.C. section 360bbb-3(b)(1), unless the authorization is terminated or revoked.  Performed at [facility], [address] [place], [place] [address]   Blood Culture (routine x 2)     Status: None   Collection Time: 01/10/22  7:57 AM   Specimen: BLOOD  Result Value Ref Range Status   Specimen Description BLOOD BLOOD RIGHT FOREARM  Final   Special Requests   Final    BOTTLES DRAWN AEROBIC AND ANAEROBIC Blood Culture results may not be optimal due to an excessive volume of blood received in culture bottles   Culture   Final    NO GROWTH 5 DAYS Performed at [facility], [address], [place], [place] [address]    Report Status 01/15/2022 FINAL  Final  Blood Culture (routine x 2)     Status: None   Collection Time: 01/10/22  7:57 AM   Specimen: BLOOD  Result Value Ref Range Status   Specimen Description BLOOD LEFT ARM  Final   Special Requests   Final    BOTTLES DRAWN AEROBIC AND ANAEROBIC Blood Culture adequate volume   Culture   Final    NO GROWTH 5 DAYS Performed at [facility], [address], [place], [place] [address]    Report Status 01/15/2022 FINAL  Final  Urine Culture     Status: Abnormal   Collection Time: 01/10/22  7:57 AM   Specimen: In/Out Cath Urine  Result Value Ref Range Status   Specimen Description   Final    IN/OUT CATH URINE Performed at [facility], [address] [place], [place] [address]    Special Requests   Final    NONE Performed at [facility], [address] [place], [place] [address]    Culture MULTIPLE SPECIES PRESENT, SUGGEST RECOLLECTION (A)  Final   Report Status  01/11/2022 FINAL  Final  MRSA Next Gen by PCR, Nasal     Status: None   Collection Time: 01/10/22  1:42 PM   Specimen: Nasal Mucosa; Nasal Swab  Result Value Ref Range Status   MRSA by PCR Next Gen NOT DETECTED NOT DETECTED Final  Comment: (NOTE) The GeneXpert MRSA Assay (FDA approved for NASAL specimens only), is one component of a comprehensive MRSA colonization surveillance program. It is not intended to diagnose MRSA infection nor to guide or monitor treatment for MRSA infections. Test performance is not FDA approved in patients less than 25 years old. Performed at [facility], [place], [place] [address]   C Difficile Quick Screen w PCR reflex     Status: None   Collection Time: 01/11/22  7:24 PM   Specimen: STOOL  Result Value Ref Range Status   C Diff antigen NEGATIVE NEGATIVE Final   C Diff toxin NEGATIVE NEGATIVE Final   C Diff interpretation No C. difficile detected.  Final    Comment: Performed at [facility], [address] [place],  [address]         Radiology Studies: DG Swallowing [facility] Pathology  Result Date: 01/13/2022 Table formatting from the original result was not included. Objective Swallowing Evaluation: Type of Study: MBS-Modified Barium Swallow Study  Patient Details Name: [name] MRN: [id number] Date of Birth: 16-Mar-1941 Today's Date: 01/13/2022 Time: SLP Start Time (ACUTE ONLY): 0109 -SLP Stop Time (ACUTE ONLY): 3235 SLP Time Calculation (min) (ACUTE ONLY): 30 min Past Medical History: Past Medical History: Diagnosis Date  Bipolar 1 disorder ([place])   C. difficile diarrhea 05/2016  history of ...  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 difficile colitis, kidney [name], BPH, CKD-3A, obesity with BMI 36.7, positive VRE urine culture in past, who presents with altered mental status, left lower leg and foot pain.     Patient has altered mental status, cannot provide accurate medical history.  I have tried to call his family without success.       Per report, patient has been confused in the past several days.  When I saw patient in the emergency room, patient is confused, knows his own name, knows that he is in hospital, but is confused about time.  He moves all extremities.  No facial droop or slurred speech.  He has pressure ulcers in both heels.  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  The left lower leg and foot are erythematous and tender.  His body temperature 99.7.  Patient does not have active respiratory distress, cough, nausea, vomiting and diarrhea.  Not sure if patient has symptoms of UTI.   CXR at admit: No active disease.  Subjective: awake, alert. Verbal though min slow to respond verbally as well as slower  motor movements intermittently - noted dxs of depression, bipolar disorder, schizophrenia, seizure in H&P  Recommendations for follow up therapy  are one component of a multi-disciplinary discharge planning process, led by the attending physician.  Recommendations may be updated based on patient status, additional functional criteria and insurance authorization. Assessment / Plan / Recommendation   01/13/2022   2:43 PM Clinical Impressions Clinical Impression Pt presents with mild oral phase and moderate pharyngeal phase dysphagia resulting in silent aspiration of thin liquids via spoon and cup as well as nectar thick liquids via spoon and cup.     When consuming thin liquids, nectar thick liquids and honey thick liquids via spoon and cup as well as puree and graham cracker with barium pt's oral phase is c/b premature spillage of boluses d/t decreased bolus cohesion.     Pt's moderate pharyngeal dysphagia is c/b delayed swallow initiation resulting in gross silent aspiration before and during the swallow when consuming thin liquids via spoon and cup as well as nectar thick liquids via spoon and cup. Pt with 1 instance of coughing but this was very delayed with aspirates deep within trachea at that point. Pt's swallow initiation was more timely when consuming honey thick liquids via spoon and cup as well as puree and graham cracker. Attempted having pt orally hold thinner consistencies but pt unable to perform compensatory strategy. At this time, safet diet recommend would be dysphagia 3 with honey thick liquids via cup, medicine whole in puree with strict aspiration precautions. SLP Visit Diagnosis Dysphagia, oropharyngeal phase (R13.12) Impact on safety and function Severe aspiration risk;Risk for inadequate nutrition/hydration     01/13/2022   2:43 PM Treatment Recommendations Treatment Recommendations Therapy as outlined in treatment plan below     01/13/2022   2:43 PM Prognosis Prognosis for Safe Diet Advancement Guarded Barriers to Reach Goals Time post onset;Severity of deficits Barriers/Prognosis Comment unsure of full Cognitive status premorbid   01/13/2022    2:43 PM Diet Recommendations SLP Diet Recommendations Dysphagia 3 (Mech soft) solids;Honey thick liquids Liquid Administration via Cup Medication Administration Whole meds with puree Compensations Minimize environmental distractions;Slow rate;Small sips/bites;Lingual sweep for clearance of pocketing;Follow solids with liquid Postural Changes Seated upright at 90 degrees     01/13/2022   2:43 PM Other Recommendations Oral Care Recommendations Oral care BID Other Recommendations Order thickener from pharmacy;Prohibited food (jello, ice cream, thin soups);Remove water pitcher;Have oral suction available Follow Up Recommendations Skilled nursing-short term rehab (<3 hours/day) Assistance recommended at discharge Set up Supervision/Assistance Functional Status Assessment Patient has had a recent decline in their functional status and demonstrates the ability to make significant improvements in function in a reasonable and predictable amount of time.   01/13/2022   2:43 PM Frequency and Duration  Speech Therapy Frequency (ACUTE ONLY) min 3x week Treatment Duration 2 weeks     01/13/2022   2:43 PM Oral Phase Oral Phase Impaired Oral - Honey Teaspoon WFL Oral - Honey Cup WFL Oral - Nectar Teaspoon Premature spillage;Decreased bolus cohesion;Weak lingual manipulation;Reduced posterior propulsion Oral - Nectar Cup Weak lingual manipulation;Reduced posterior propulsion;Decreased bolus cohesion;Premature spillage Oral - Thin Teaspoon Weak lingual manipulation;Reduced posterior propulsion;Premature spillage;Decreased bolus cohesion Oral - Thin Cup Weak lingual manipulation;Reduced posterior propulsion;Decreased bolus cohesion;Premature spillage Oral - Puree Weak lingual manipulation;Reduced posterior propulsion;Decreased bolus cohesion;Premature spillage Oral - Mech Soft Weak lingual manipulation    01/13/2022   2:43 PM Pharyngeal Phase Pharyngeal Phase Impaired Pharyngeal- Honey Teaspoon Delayed swallow initiation-vallecula Pharyngeal-  Honey Cup Delayed swallow  initiation-vallecula Pharyngeal- Nectar Teaspoon Delayed swallow initiation-pyriform sinuses;Reduced airway/laryngeal closure;Penetration/Aspiration before swallow;Penetration/Aspiration during swallow;Moderate aspiration Pharyngeal Material enters airway, passes BELOW cords without attempt by patient to eject out (silent aspiration) Pharyngeal- Nectar Cup Delayed swallow initiation-pyriform sinuses;Reduced airway/laryngeal closure;Penetration/Aspiration before swallow;Penetration/Aspiration during swallow;Moderate aspiration Pharyngeal Material enters airway, passes BELOW cords without attempt by patient to eject out (silent aspiration) Pharyngeal- Thin Teaspoon Delayed swallow initiation-pyriform sinuses;Penetration/Aspiration before swallow;Penetration/Aspiration during swallow;Moderate aspiration Pharyngeal Material enters airway, passes BELOW cords without attempt by patient to eject out (silent aspiration) Pharyngeal- Thin Cup Delayed swallow initiation-pyriform sinuses;Reduced airway/laryngeal closure;Penetration/Aspiration before swallow;Penetration/Aspiration during swallow;Moderate aspiration Pharyngeal Material enters airway, passes BELOW cords without attempt by patient to eject out (silent aspiration) Pharyngeal- Puree Delayed swallow initiation-vallecula Pharyngeal- Mechanical Soft Delayed swallow initiation-vallecula    01/13/2022   2:43 PM Cervical Esophageal Phase  Cervical Esophageal Phase [facility] Happi Overton 01/13/2022, 4:10 PM                          Scheduled Meds:  acidophilus  1 capsule Oral Daily   ARIPiprazole  20 mg Oral q morning   vitamin C  500 mg Oral BID   buPROPion  300 mg Oral Daily   Chlorhexidine Gluconate Cloth  6 each Topical Daily   cholecalciferol  1,000 Units Oral q morning   enoxaparin (LOVENOX) injection  0.5 mg/kg Subcutaneous Q24H   ferrous sulfate  325 mg Oral Q breakfast   finasteride  5 mg Oral Daily   folic acid  1 mg Oral q morning    insulin aspart  0-5 Units Subcutaneous QHS   insulin aspart  0-9 Units Subcutaneous TID WC   loratadine  10 mg Oral Daily   multivitamin with minerals  1 tablet Oral Daily   PHENobarbital  64.8 mg Oral QODAY   potassium chloride  40 mEq Oral Once   pravastatin  40 mg Oral Daily   zinc sulfate  220 mg Oral Daily   Continuous Infusions:  ceFEPime (MAXIPIME) IV 2 g (01/14/22 2356)   vancomycin Stopped (01/14/22 1900)    Assessment & Plan:   Principal Problem:   Cellulitis of left lower extremity Active Problems:   UTI (urinary tract infection)   Sepsis (HCC)   Acute metabolic encephalopathy   Chronic diastolic CHF (congestive heart failure) (HCC)   COPD (chronic obstructive pulmonary disease) (HCC)   Seizure disorder (HCC)   Chronic kidney disease, stage 3a (HCC)   Depression   Hypertension   Mixed hyperlipidemia   BPH (benign prostatic hyperplasia)   Normocytic anemia   Hypokalemia   Hypomagnesemia   Dysphagia   Cellulitis of left lower extremity Pt has left lower extremity cellulitis in left foot and lower leg.  - Empiric antimicrobial treatment with vancomycin and cefepime (patient received 1 dose of Flagyl in ED) - Blood cultures x 2 -negative so far - ESR and CRP-significantly elevated with ESR of 111 and CRP of 19.4 - wound care consult-appreciate their recommendations 6/9 continue IV antibiotics  May need as needed Lasix for edema of lower extremity          UTI (urinary tract infection) UA positive for UTI. Pt has hx of positive culture of VRE.  Urine cultures with multiple species. 6/9 antibiotics as above   Sepsis ([place]) Sepsis due to UTI and cellulitis of left lower extremity:  Continue IV antibiotics for 1 more day    Acute metabolic encephalopathy Possibly due to UTI.  CT head negative.  6/8 improved but at times waxes and wanes    Chronic diastolic CHF (congestive heart failure) ([place]) 2D echo on 10/12/2018 showed EF> 55%.  Patient has  bilateral leg edema. no worsening shortness of breath.  Oxygen saturation 99% on room air.  Patient does not seem to have acute CHF exacerbation.  His BNP is elevated 550, obviously pt is at risk of developing CHF exacerbation. Use lasix once renal function stable.   COPD (chronic obstructive pulmonary disease) (HCC) Stable -Bronchodilators   Seizure disorder ([place]) -Seizure precaution -As needed Ativan for seizure -Continue home phenobarbital   Chronic kidney disease, stage 3a (HCC) Recent baseline creatinine 1.0-1.4.  His creatinine is 1.51, BUN 37, close to baseline. -Follow-up by BMP   Depression - Continue home medications   Hypertension - IV hydralazine as needed -Hold amlodipine due to sepsis and risk of developing hypotension -Coreg   Mixed hyperlipidemia - Pravastatin   BPH (benign prostatic hyperplasia) - Proscar   Normocytic anemia Hemoglobin stable, 10.7 today (10.1 on 04/11/2021) -Follow-up with CBC   Hypomagnesemia Resolved. Mg 1.5>>2.2     Hypokalemia Resolved.   -Continue to monitor and replete as needed-patient is being diuresed       Dysphagia Swallow evaluation was obtained with nursing concern of coughing with food.  They are recommending nectar thick and dysphagia diet. -Continue with dysphagia diet with nectar thick liquid as recommended.   DVT prophylaxis: lovenox Code Status:full Family Communication: none at bedside Disposition Plan:  Status is: Inpatient Remains inpatient appropriate because: iv treatment. Unsafe discharge. Not sure pt can be at assisted living. PT to reevaluate his status again        LOS: 5 days   Time spent: 35 min     Nolberto Hanlon, MD [facility] Pager [phone]  If 7PM-7AM, please contact night-coverage 01/15/2022, 8:18 AM

## 2022-01-16 DIAGNOSIS — L03116 Cellulitis of left lower limb: Secondary | ICD-10-CM | POA: Diagnosis not present

## 2022-01-16 LAB — CREATININE, SERUM
Creatinine, Ser: 1.1 mg/dL (ref 0.61–1.24)
GFR, Estimated: >60 mL/min (ref >60)

## 2022-01-16 LAB — GLUCOSE, CAPILLARY
Glucose-Capillary: 90 mg/dL (ref 70–99)
Glucose-Capillary: 101 mg/dL — ABNORMAL HIGH (ref 70–99)

## 2022-01-16 MED ORDER — CARVEDILOL 3.125 MG PO TABS: 25.0000 mg | ORAL_TABLET | Freq: Two times a day (BID) | ORAL | Status: DC

## 2022-01-16 MED ORDER — ASCORBIC ACID 500 MG PO TABS: 500.0000 mg | ORAL_TABLET | Freq: Two times a day (BID) | ORAL | Status: DC

## 2022-01-16 MED ORDER — FUROSEMIDE 40 MG PO TABS: 20.0000 mg | ORAL_TABLET | Freq: Every day | ORAL | Status: DC

## 2022-01-16 MED ORDER — POTASSIUM CHLORIDE CRYS ER 10 MEQ PO TBCR: 10.0000 meq | EXTENDED_RELEASE_TABLET | Freq: Every day | ORAL | Status: DC

## 2022-01-16 NOTE — Progress Notes (Signed)
Report called  to [facility] and given report to  Legrand Como, Lpn all questions answered to satisfaction.

## 2022-01-16 NOTE — TOC Transition Note (Signed)
Transition of Care [facility]) - CM/SW Discharge Note   Patient Details  Name: DEMETRIC NORTHRIP MRN: [id number] Date of Birth: 1941-04-03  Transition of Care [facility]) CM/SW Contact:  Adelene Amas, LCSW Phone Number: [phone] 01/16/2022, 11:36 AM   Clinical Narrative:     Patient will discharge to [facility] room 810, report [phone], CSW updated RN.  EMS non-emergent transport arranged and will transport patient after 12:00PM.  Final next level of care: (P) Home w Home Health Services Barriers to Discharge: (P) Barriers Resolved   Patient Goals and CMS Choice        Discharge Placement                       Discharge Plan and Services   Discharge Planning Services: (P) CM Consult Post Acute Care Choice: (P) Home Health          DME Arranged: (P) Walker rolling DME Agency: (P) [employer] Date DME Agency Contacted: (P) 01/12/22 Time DME Agency Contacted: (P) [id number] Representative spoke with at DME Agency: (P) Rhonda HH Arranged: (P) PT          Social Determinants of Health (SDOH) Interventions     Readmission Risk Interventions     No data to display

## 2022-01-16 NOTE — Discharge Summary (Addendum)
[name] OMV:[id number] DOB: 1941-06-05 DOA: 01/10/2022  PCP: [name] Laughter, MD  Admit date: 01/10/2022 Discharge date: 01/16/2022  Admitted From: Assisted living Disposition: SNF  Recommendations for Outpatient Follow-up:  Follow up with PCP in 1 week Please obtain BMP/CBC in one week Needs wound nursing care for lower extremity Follow up with podiatry for foot/nail care    Discharge Condition:Stable CODE STATUS: Full Diet recommendation: Dysphagia 3 diet, fluid consistency honey thick.  Low-sodium    Brief/Interim Summary: Per GGE:ZMOQH [name] is a 81 y.o. 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 difficile colitis, kidney stone, BPH, CKD-3A, obesity with BMI 36.7, positive VRE urine culture in past, who presents with altered mental status, left lower leg and foot pain.Per report, patient had been confused in the past several days.  When I saw patient in the emergency room, patient is confused, knows his own name, knows that he is in hospital, but is confused about time.  He moves all extremities.  No facial droop or slurred speech.  He has pressure ulcers in both heels.  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  The left lower leg and foot are erythematous and tender.  His body temperature 99.7.  Patient does not have active respiratory distress, cough, nausea, vomiting and diarrhea.  Not sure if patient has symptoms of UTI.Data Reviewed and ED Course: pt was found to have WBC 18.5, BMP 550, lactic acid 0.9, INR 1.3, positive urinalysis (cloudy appearance, moderate amount of leukocyte, many bacteria, WBC> 50), negative COVID PCR, CK level 88, renal function close to baseline, potassium 2.2, temperature 99.7, blood pressure 132/74, RR 22, HR 83, oxygen saturation 99% on room air.  Chest x-ray negative.  VBG with pH 7.43, CO2 43. CT  head negative for acute intracranial abnormalities.  Left foot x-rays showed no evidence of osteomyelitis.  Patient is admitted to telemetry bed as inpatient.Procalcitonin elevated at 1.09, troponin at 40, BNP 530, elevated ESR at 111 and CRP 19.4  He was supplemented with potassium.  He was started on IV Lasix and IV antibiotics.  His mental status appears to be at baseline.  Blood cultures today was negative.  Urine culture revealed multiple species.  Cellulitis of left lower extremity Pt has left lower extremity cellulitis in left foot and lower leg.  - Empiric antimicrobial treatment with vancomycin and cefepime - Blood cultures x 2 - - ESR and CRP-significantly elevated with ESR of 111 and CRP of 19.4 - wound care was consulted Due to his edema he was given IV Lasix. Needs to keep legs elevated while sitting Completed IV antibiotic course Venous ultrasound negative for DVT         UTI (urinary tract infection) UA positive for UTI. Pt has hx of positive culture of VRE.  Urine cultures with multiple species. Was treated with antibiotics as above   Sepsis ([place]) Sepsis due to UTI and cellulitis of left lower extremity:  Treated with antibiotics as above       Acute metabolic encephalopathy Possibly due to UTI.  CT head negative. Appears to be at baseline     Chronic diastolic CHF (congestive heart failure) ([place]) 2D echo on 10/12/2018 showed EF> 55%.   No acute exacerbation On room air with 02 sat 100% Lasix 55m po daily. May need adjustments based on clinical exam.     COPD (chronic obstructive pulmonary disease) ([place] Stable  Continue home meds   Seizure disorder ([place]) -Seizure precaution Continue home meds   Chronic kidney disease, stage 3a ([place]) Recent baseline creatinine 1.0-1.4.   At baseline   Depression - Continue home medications   Hypertension Decreased coreg and lasix dose as below. Monitor and adjust accordingly   Mixed hyperlipidemia On statin    BPH (benign prostatic hyperplasia) - Proscar   Normocytic anemia H&H stable Follow-up with PCP for further monitoring and management   Hypomagnesemia Resolved. stable     Hypokalemia Resolved.   -Continue to monitor and replete as needed-patient is on lasix.      Dysphagia Swallow evaluation was obtained with nursing concern of coughing with food.  They are recommending nectar thick and dysphagia 3 diet.    Discharge Diagnoses:  Principal Problem:   Cellulitis of left lower extremity Active Problems:   UTI (urinary tract infection)   Sepsis (HCC)   Acute metabolic encephalopathy   Chronic diastolic CHF (congestive heart failure) (HCC)   COPD (chronic obstructive pulmonary disease) (HCC)   Seizure disorder (HCC)   Chronic kidney disease, stage 3a (HCC)   Depression   Hypertension   Mixed hyperlipidemia   BPH (benign prostatic hyperplasia)   Normocytic anemia   Hypokalemia   Hypomagnesemia   Dysphagia    Discharge Instructions  Discharge Instructions     Diet - low sodium heart healthy   Complete by: As directed    Discharge wound care:   Complete by: As directed    As above   Increase activity slowly   Complete by: As directed       Allergies as of 01/16/2022       Reactions   Penicillin G Rash, Other (See Comments)   Convulsions.    Has patient had a PCN reaction causing immediate rash, facial/tongue/throat swelling, SOB or lightheadedness with hypotension: not sure Has patient had a PCN reaction causing severe rash involving mucus membranes or skin necrosis: not sure Has patient had a PCN reaction that required hospitalization: not sure Has patient had a PCN reaction occurring within the last 10 years: not sure If all of the above answers are "NO", then may proceed with Cephalosporin use.        Medication List     STOP taking these medications    acetaminophen 325 MG tablet Commonly known as: TYLENOL   amLODipine 2.5 MG  tablet Commonly known as: NORVASC   oxyCODONE 5 MG immediate release tablet Commonly known as: Oxy IR/ROXICODONE       TAKE these medications    albuterol 108 (90 Base) MCG/ACT inhaler Commonly known as: VENTOLIN HFA Inhale 2 puffs into the lungs every 6 (six) hours as needed for wheezing or shortness of breath.   ARIPiprazole 20 MG tablet Commonly known as: ABILIFY Take 20 mg by mouth in the morning.   ascorbic acid 500 MG tablet Commonly known as: VITAMIN C Take 1 tablet (500 mg total) by mouth 2 (two) times daily.   buPROPion 300 MG 24 hr tablet Commonly known as: Budeprion XL Take 1 tablet (300 mg total) by mouth daily.   carvedilol 3.125 MG tablet Commonly known as: COREG Take 8 tablets (25 mg total) by mouth 2 (two) times daily. What changed: medication strength   cetirizine 10 MG tablet Commonly known as: ZYRTEC Take 10 mg by mouth in the morning.   cholecalciferol 25 MCG (1000 UNIT) tablet Commonly known as: VITAMIN D3 Take 1,000 Units by mouth in the morning.  Cranberry 250 MG Caps Take 250 mg by mouth 2 (two) times daily.   ferrous sulfate 325 (65 FE) MG tablet Take 325 mg by mouth daily with breakfast.   finasteride 5 MG tablet Commonly known as: PROSCAR Take 1 tablet (5 mg total) by mouth daily.   folic acid 1 MG tablet Commonly known as: FOLVITE Take 1 mg by mouth in the morning.   furosemide 40 MG tablet Commonly known as: LASIX Take 0.5 tablets (20 mg total) by mouth daily. What changed:  how much to take when to take this   Multi-Vitamins Tabs Take 1 tablet by mouth daily.   PHENobarbital 64.8 MG tablet Commonly known as: LUMINAL Take 64.8 mg by mouth every other day.   potassium chloride 10 MEQ tablet Commonly known as: KLOR-CON M Take 1 tablet (10 mEq total) by mouth daily. When taking lasix What changed:  how much to take when to take this additional instructions   pravastatin 40 MG tablet Commonly known as:  PRAVACHOL Take 1 tablet (40 mg total) by mouth daily.   Suvorexant 5 MG Tabs Take 5 mg by mouth at bedtime.   zinc oxide 20 % ointment Apply 1 application. topically 2 (two) times daily. (Apply to legs)               Discharge Care Instructions  (From admission, onward)           Start     Ordered   01/16/22 0000  Discharge wound care:       Comments: As above   01/16/22 1004            Contact information for follow-up providers     [name] Laughter, MD Follow up in 1 week(s).   Specialty: Internal Medicine Contact information: [address] [address] [phone]              Contact information for after-discharge care     Destination     [facility] Preferred SNF .   Service: Skilled Nursing Contact information: [place] [phone]                    Allergies  Allergen Reactions   Penicillin G Rash and Other (See Comments)    Convulsions.    Has patient had a PCN reaction causing immediate rash, facial/tongue/throat swelling, SOB or lightheadedness with hypotension: not sure Has patient had a PCN reaction causing severe rash involving mucus membranes or skin necrosis: not sure Has patient had a PCN reaction that required hospitalization: not sure Has patient had a PCN reaction occurring within the last 10 years: not sure If all of the above answers are "NO", then may proceed with Cephalosporin use.    Consultations:    Procedures/Studies: DG Swallowing Func-Speech Pathology  Result Date: 01/13/2022 Table formatting from the original result was not included. Objective Swallowing Evaluation: Type of Study: MBS-Modified Barium Swallow Study  Patient Details Name: [name] MRN: [id number] Date of Birth: January 26, 1941 Today's Date: 01/13/2022 Time: SLP Start Time (ACUTE ONLY): 2952 -SLP Stop Time (ACUTE ONLY): 8413 SLP Time Calculation (min) (ACUTE  ONLY): 30 min Past Medical History: Past Medical History: Diagnosis Date  Bipolar 1 disorder ([place])   C. difficile diarrhea 05/2016  history of ...  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     Patient has altered mental status, cannot provide accurate medical history.  I have tried to call his family without success.       Per report, patient has been confused in the past several days.  When I saw patient in the emergency room, patient is confused, knows his own name, knows that he is in hospital, but is confused about time.  He moves all extremities.  No facial droop or slurred speech.  He has pressure ulcers in both heels.  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  The left lower leg and foot are  erythematous and tender.  His body temperature 99.7.  Patient does not have active respiratory distress, cough, nausea, vomiting and diarrhea.  Not sure if patient has symptoms of UTI.   CXR at admit: No active disease.  Subjective: awake, alert. Verbal though min slow to respond verbally as well as slower motor movements intermittently - noted dxs of depression, bipolar disorder, schizophrenia, seizure in H&P  Recommendations for follow up therapy are one component of a multi-disciplinary discharge planning process, led by the attending physician.  Recommendations may be updated based on patient status, additional functional criteria and insurance authorization. Assessment / Plan / Recommendation   01/13/2022   2:43 PM Clinical Impressions Clinical Impression Pt presents with mild oral phase and moderate pharyngeal phase dysphagia resulting in silent aspiration of thin liquids via spoon and cup as well as nectar thick liquids via spoon and cup.     When consuming thin liquids, nectar thick liquids and honey thick liquids via spoon and cup as well as puree and graham cracker with barium pt's oral phase is c/b premature spillage of boluses d/t decreased bolus cohesion.     Pt's moderate pharyngeal dysphagia is c/b delayed swallow initiation resulting in gross silent aspiration before and during the swallow when consuming thin liquids via spoon and cup as well as nectar thick liquids via spoon and cup. Pt with 1 instance of coughing but this was very delayed with aspirates deep within trachea at that point. Pt's swallow initiation was more timely when consuming honey thick liquids via spoon and cup as well as puree and graham cracker. Attempted having pt orally hold thinner consistencies but pt unable to perform compensatory strategy. At this time, safet diet recommend would be dysphagia 3 with honey thick liquids via cup, medicine whole in puree with strict aspiration precautions. SLP Visit Diagnosis Dysphagia,  oropharyngeal phase (R13.12) Impact on safety and function Severe aspiration risk;Risk for inadequate nutrition/hydration     01/13/2022   2:43 PM Treatment Recommendations Treatment Recommendations Therapy as outlined in treatment plan below     01/13/2022   2:43 PM Prognosis Prognosis for Safe Diet Advancement Guarded Barriers to Reach Goals Time post onset;Severity of deficits Barriers/Prognosis Comment unsure of full Cognitive status premorbid   01/13/2022   2:43 PM Diet Recommendations SLP Diet Recommendations Dysphagia 3 (Mech soft) solids;Honey thick liquids Liquid Administration via Cup Medication Administration Whole meds with puree Compensations Minimize environmental distractions;Slow rate;Small sips/bites;Lingual sweep for clearance of pocketing;Follow solids with liquid Postural Changes Seated upright at 90 degrees     01/13/2022   2:43 PM Other Recommendations Oral Care Recommendations Oral care BID Other Recommendations Order thickener from pharmacy;Prohibited food (jello, ice cream, thin soups);Remove water pitcher;Have oral suction available Follow Up Recommendations Skilled nursing-short term rehab (<3 hours/day) Assistance recommended at discharge Set up Supervision/Assistance Functional Status Assessment Patient has had a recent decline in their functional status and demonstrates the ability to make significant improvements in function in a reasonable and predictable amount of time.   01/13/2022   2:43 PM Frequency and Duration  Speech Therapy Frequency (ACUTE ONLY) min 3x week Treatment Duration 2 weeks     01/13/2022   2:43 PM Oral Phase Oral Phase Impaired Oral - Honey Teaspoon WFL Oral - Honey Cup WFL Oral - Nectar Teaspoon Premature spillage;Decreased bolus cohesion;Weak lingual manipulation;Reduced posterior propulsion Oral - Nectar Cup Weak lingual manipulation;Reduced posterior propulsion;Decreased bolus cohesion;Premature spillage Oral - Thin Teaspoon Weak lingual manipulation;Reduced posterior  propulsion;Premature spillage;Decreased bolus cohesion Oral - Thin Cup Weak lingual manipulation;Reduced posterior propulsion;Decreased bolus cohesion;Premature spillage Oral - Puree Weak lingual manipulation;Reduced posterior propulsion;Decreased bolus cohesion;Premature spillage Oral - Mech Soft Weak lingual manipulation    01/13/2022   2:43 PM Pharyngeal Phase Pharyngeal Phase Impaired Pharyngeal- Honey Teaspoon Delayed swallow initiation-vallecula Pharyngeal- Honey Cup Delayed swallow initiation-vallecula Pharyngeal- Nectar Teaspoon Delayed swallow initiation-pyriform sinuses;Reduced airway/laryngeal closure;Penetration/Aspiration before swallow;Penetration/Aspiration during swallow;Moderate aspiration Pharyngeal Material enters airway, passes BELOW cords without attempt by patient to eject out (silent aspiration) Pharyngeal- Nectar Cup Delayed swallow initiation-pyriform sinuses;Reduced airway/laryngeal closure;Penetration/Aspiration before swallow;Penetration/Aspiration during swallow;Moderate aspiration Pharyngeal Material enters airway, passes BELOW cords without attempt by patient to eject out (silent aspiration) Pharyngeal- Thin Teaspoon Delayed swallow initiation-pyriform sinuses;Penetration/Aspiration before swallow;Penetration/Aspiration during swallow;Moderate aspiration Pharyngeal Material enters airway, passes BELOW cords without attempt by patient to eject out (silent aspiration) Pharyngeal- Thin Cup Delayed swallow initiation-pyriform sinuses;Reduced airway/laryngeal closure;Penetration/Aspiration before swallow;Penetration/Aspiration during swallow;Moderate aspiration Pharyngeal Material enters airway, passes BELOW cords without attempt by patient to eject out (silent aspiration) Pharyngeal- Puree Delayed swallow initiation-vallecula Pharyngeal- Mechanical Soft Delayed swallow initiation-vallecula    01/13/2022   2:43 PM Cervical Esophageal Phase  Cervical Esophageal Phase [facility] Happi Overton 01/13/2022,  4:10 PM                     US Venous Img Lower Bilateral (DVT)  Result Date: 01/11/2022 CLINICAL DATA:  Lower extremity cellulitis, pain and edema EXAM: BILATERAL LOWER EXTREMITY VENOUS DOPPLER ULTRASOUND TECHNIQUE: Gray-scale sonography with graded compression, as well as color Doppler and duplex ultrasound were performed to evaluate the lower extremity deep venous systems from the level of the common femoral vein and including the common femoral, femoral, profunda femoral, popliteal and calf veins including the posterior tibial, peroneal and gastrocnemius veins when visible. Spectral Doppler was utilized to evaluate flow at rest and with distal  augmentation maneuvers in the common femoral, femoral and popliteal veins. COMPARISON:  04/10/2021 FINDINGS: RIGHT LOWER EXTREMITY Common Femoral Vein: No evidence of thrombus. Normal compressibility, respiratory phasicity and response to augmentation. Saphenofemoral Junction: No evidence of thrombus. Normal compressibility and flow on color Doppler imaging. Profunda Femoral Vein: No evidence of thrombus. Normal compressibility and flow on color Doppler imaging. Femoral Vein: No evidence of thrombus. Normal compressibility, respiratory phasicity and response to augmentation. Popliteal Vein: No evidence of thrombus. Normal compressibility, respiratory phasicity and response to augmentation. Calf Veins: No evidence of thrombus. Normal compressibility and flow on color Doppler imaging. LEFT LOWER EXTREMITY Common Femoral Vein: No evidence of thrombus. Normal compressibility, respiratory phasicity and response to augmentation. Saphenofemoral Junction: No evidence of thrombus. Normal compressibility and flow on color Doppler imaging. Profunda Femoral Vein: No evidence of thrombus. Normal compressibility and flow on color Doppler imaging. Femoral Vein: No evidence of thrombus. Normal compressibility, respiratory phasicity and response to augmentation. Popliteal Vein: No  evidence of thrombus. Normal compressibility, respiratory phasicity and response to augmentation. Calf Veins: No evidence of thrombus. Normal compressibility and flow on color Doppler imaging. Limited exam because of body habitus. IMPRESSION: No significant DVT in either lower extremity. Limited assessment of the calf veins because of body habitus. Electronically Signed   By: Jerilynn Mages.  Shick M.D.   On: 01/11/2022 07:39   CT HEAD WO CONTRAST (5MM)  Result Date: 01/10/2022 CLINICAL DATA:  Altered mental status for 3 days. EXAM: CT HEAD WITHOUT CONTRAST TECHNIQUE: Contiguous axial images were obtained from the base of the skull through the vertex without intravenous contrast. RADIATION DOSE REDUCTION: This exam was performed according to the departmental dose-optimization program which includes automated exposure control, adjustment of the mA and/or kV according to patient size and/or use of iterative reconstruction technique. COMPARISON:  09/22/2016 FINDINGS: Brain: No evidence of intracranial hemorrhage, acute infarction, hydrocephalus, extra-axial collection, or mass lesion/mass effect. Mild-to-moderate diffuse cerebral and cerebellar atrophy is unchanged in appearance. Moderate chronic small vessel disease and old bilateral basal ganglia lacunar infarcts are again demonstrated. Vascular:  No hyperdense vessel or other acute findings. Skull: No evidence of fracture or other significant bone abnormality. Sinuses/Orbits:  No acute findings. Other: None. IMPRESSION: No acute intracranial abnormality. Stable cerebral and cerebellar atrophy, chronic small vessel disease, and old bilateral basal ganglia lacunar infarcts. Electronically Signed   By: Marlaine Hind M.D.   On: 01/10/2022 09:05   DG Foot Complete Left  Result Date: 01/10/2022 CLINICAL DATA:  Cellulitis, concern for osteomyelitis EXAM: LEFT FOOT - COMPLETE 3+ VIEW COMPARISON:  None Available. FINDINGS: Hammertoe deformity of the great toe. Diffuse soft tissue  swelling over the dorsum of the foot and the anterior lower leg. No conventional radiographic evidence of osteomyelitis visualized. No fracture or malalignment. The bones appear diffusely demineralized. IMPRESSION: No conventional radiographic evidence of osteomyelitis. Hammertoe deformity of the first and second toes. Electronically Signed   By: Jacqulynn Cadet M.D.   On: 01/10/2022 08:27   DG Chest Port 1 View  Result Date: 01/10/2022 CLINICAL DATA:  Sepsis.  Altered mental status.  COPD. EXAM: PORTABLE CHEST 1 VIEW COMPARISON:  12/25/2016 FINDINGS: The heart size and mediastinal contours are within normal limits. Aortic atherosclerotic calcification incidentally noted. Low lung volumes are again noted. Both lungs are clear. IMPRESSION: No active disease. Electronically Signed   By: Marlaine Hind M.D.   On: 01/10/2022 08:21      Subjective: Has no shortness of breath, chest pain, or any other complaints Discharge  Exam: Vitals:   01/16/22 0358 01/16/22 0859  BP: 131/61 135/60  Pulse: 80 66  Resp: 17 14  Temp: 98.2 F (36.8 C) (!) 97.5 F (36.4 C)  SpO2: 100% 100%   Vitals:   01/15/22 2053 01/16/22 0358 01/16/22 0500 01/16/22 0859  BP: 127/69 131/61  135/60  Pulse: 80 80  66  Resp: _0 Temp: 97.9 F (36.6 C) 98.2 F (36.8 C)  (!) 97.5 F (36.4 C)  TempSrc:      SpO2: 100% 100%  100%  Weight:   116.3 kg   Height:        General: Pt is alert, awake, not in acute distress Cardiovascular: RRR, S1/S2 +, no rubs, no gallops Respiratory: CTA bilaterally, no wheezing, no rhonchi Abdominal: Soft, NT, ND, bowel sounds + Extremities: +edema b/[name]. In boots    The results of significant diagnostics from this hospitalization (including imaging, microbiology, ancillary and laboratory) are listed below for reference.     Microbiology: Recent Results (from the past 240 hour(s))  Resp Panel by RT-PCR (Flu A&B, Covid) Anterior Nasal Swab     Status: None   Collection Time:  01/10/22  7:57 AM   Specimen: Anterior Nasal Swab  Result Value Ref Range Status   SARS Coronavirus 2 by RT PCR NEGATIVE NEGATIVE Final    Comment: (NOTE) SARS-CoV-2 target nucleic acids are NOT DETECTED.  The SARS-CoV-2 RNA is generally detectable in upper respiratory specimens during the acute phase of infection. The lowest concentration of SARS-CoV-2 viral copies this assay can detect is 138 copies/mL. A negative result does not preclude SARS-Cov-2 infection and should not be used as the sole basis for treatment or other patient management decisions. A negative result may occur with  improper specimen collection/handling, submission of specimen other than nasopharyngeal swab, presence of viral mutation(s) within the areas targeted by this assay, and inadequate number of viral copies(<138 copies/mL). A negative result must be combined with clinical observations, patient history, and epidemiological information. The expected result is Negative.  Fact Sheet for Patients:  EntrepreneurPulse.com.au  Fact Sheet for Healthcare Providers:  IncredibleEmployment.be  This test is no t yet approved or cleared by the [place] FDA and  has been authorized for detection and/or diagnosis of SARS-CoV-2 by FDA under an Emergency Use Authorization (EUA). This EUA will remain  in effect (meaning this test can be used) for the duration of the COVID-19 declaration under Section 564(b)(1) of the Act, 21 U.S.C.section 360bbb-3(b)(1), unless the authorization is terminated  or revoked sooner.       Influenza A by PCR NEGATIVE NEGATIVE Final   Influenza B by PCR NEGATIVE NEGATIVE Final    Comment: (NOTE) The Xpert Xpress SARS-CoV-2/FLU/RSV plus assay is intended as an aid in the diagnosis of influenza from Nasopharyngeal swab specimens and should not be used as a sole basis for treatment. Nasal washings and aspirates are unacceptable for Xpert Xpress  SARS-CoV-2/FLU/RSV testing.  Fact Sheet for Patients: EntrepreneurPulse.com.au  Fact Sheet for Healthcare Providers: IncredibleEmployment.be  This test is not yet approved or cleared by the [place] FDA and has been authorized for detection and/or diagnosis of SARS-CoV-2 by FDA under an Emergency Use Authorization (EUA). This EUA will remain in effect (meaning this test can be used) for the duration of the COVID-19 declaration under Section 564(b)(1) of the Act, 21 U.S.C. section 360bbb-3(b)(1), unless the authorization is terminated or revoked.  Performed at [facility], [place], [place]  [address]   Blood Culture (routine x 2)     Status: None   Collection Time: 01/10/22  7:57 AM   Specimen: BLOOD  Result Value Ref Range Status   Specimen Description BLOOD BLOOD RIGHT FOREARM  Final   Special Requests   Final    BOTTLES DRAWN AEROBIC AND ANAEROBIC Blood Culture results may not be optimal due to an excessive volume of blood received in culture bottles   Culture   Final    NO GROWTH 5 DAYS Performed at [facility], [address], [place], [place] [address]    Report Status 01/15/2022 FINAL  Final  Blood Culture (routine x 2)     Status: None   Collection Time: 01/10/22  7:57 AM   Specimen: BLOOD  Result Value Ref Range Status   Specimen Description BLOOD LEFT ARM  Final   Special Requests   Final    BOTTLES DRAWN AEROBIC AND ANAEROBIC Blood Culture adequate volume   Culture   Final    NO GROWTH 5 DAYS Performed at [facility], [address] [place], [place] [address]    Report Status 01/15/2022 FINAL  Final  Urine Culture     Status: Abnormal   Collection Time: 01/10/22  7:57 AM   Specimen: In/Out Cath Urine  Result Value Ref Range Status   Specimen Description   Final    IN/OUT CATH URINE Performed at [facility], [address] [place], [place] [address]     Special Requests   Final    NONE Performed at [facility], [address] [place], [place] [address]    Culture MULTIPLE SPECIES PRESENT, SUGGEST RECOLLECTION (A)  Final   Report Status 01/11/2022 FINAL  Final  MRSA Next Gen by PCR, Nasal     Status: None   Collection Time: 01/10/22  1:42 PM   Specimen: Nasal Mucosa; Nasal Swab  Result Value Ref Range Status   MRSA by PCR Next Gen NOT DETECTED NOT DETECTED Final    Comment: (NOTE) The GeneXpert MRSA Assay (FDA approved for NASAL specimens only), is one component of a comprehensive MRSA colonization surveillance program. It is not intended to diagnose MRSA infection nor to guide or monitor treatment for MRSA infections. Test performance is not FDA approved in patients less than 12 years old. Performed at [facility], [place], [place] [address]   C Difficile Quick Screen w PCR reflex     Status: None   Collection Time: 01/11/22  7:24 PM   Specimen: STOOL  Result Value Ref Range Status   C Diff antigen NEGATIVE NEGATIVE Final   C Diff toxin NEGATIVE NEGATIVE Final   C Diff interpretation No C. difficile detected.  Final    Comment: Performed at [facility], [address] [place], [place] [address]     Labs: BNP (last 3 results) Recent Labs    04/10/21 1957 01/10/22 0934  BNP 421.3* 683.7*   Basic Metabolic Panel: Recent Labs  Lab 01/10/22 0757 01/11/22 0158 01/11/22 0946 01/12/22 0536 01/13/22 0420 01/14/22 0529 01/15/22 0440 01/16/22 0439  NA 141 142  --  138 137 139  --   --   K 2.2* 2.4* 3.2* 3.5 3.3* 3.5  --   --   CL 112* 114*  --  110 109 113*  --   --   CO2 25 24  --  _0 --   --   GLUCOSE  121* 106*  --  212* 190* 106*  --   --   BUN 37* 35*  --  38* 44* 45*  --   --   CREATININE 1.51* 1.54*  --  1.41* 1.35* 1.22 1.22 1.10  CALCIUM 7.7* 7.2*  --  7.9* 8.0* 7.9*  --   --   MG 1.9 2.2  --   --   --   --   --   --    Liver Function Tests: Recent  Labs  Lab 01/10/22 0757  AST 88*  ALT 39  ALKPHOS 136*  BILITOT 0.8  PROT 6.6  ALBUMIN 2.1*   No results for input(s): "LIPASE", "AMYLASE" in the last 168 hours. No results for input(s): "AMMONIA" in the last 168 hours. CBC: Recent Labs  Lab 01/10/22 0757 01/11/22 0158 01/13/22 0420 01/14/22 0529  WBC 18.5* 15.4* 17.8* 11.8*  NEUTROABS 16.1*  --   --   --   HGB 10.7* 9.4* 10.0* 9.7*  HCT 33.6* 29.4* 31.8* 31.0*  MCV 85.3 84.7 86.6 85.2  PLT 304 253 397 384   Cardiac Enzymes: Recent Labs  Lab 01/10/22 0757  CKTOTAL 88   BNP: Invalid input(s): "POCBNP" CBG: Recent Labs  Lab 01/15/22 0811 01/15/22 1249 01/15/22 1652 01/15/22 2149 01/16/22 0902  GLUCAP 123* 131* 127* 175* 90   D-Dimer No results for input(s): "DDIMER" in the last 72 hours. Hgb A1c No results for input(s): "HGBA1C" in the last 72 hours. Lipid Profile No results for input(s): "CHOL", "HDL", "LDLCALC", "TRIG", "CHOLHDL", "LDLDIRECT" in the last 72 hours. Thyroid function studies No results for input(s): "TSH", "T4TOTAL", "T3FREE", "THYROIDAB" in the last 72 hours.  Invalid input(s): "FREET3" Anemia work up No results for input(s): "VITAMINB12", "FOLATE", "FERRITIN", "TIBC", "IRON", "RETICCTPCT" in the last 72 hours. Urinalysis    Component Value Date/Time   COLORURINE YELLOW (A) 01/10/2022 0757   APPEARANCEUR CLOUDY (A) 01/10/2022 0757   APPEARANCEUR Hazy 10/02/2012 0915   LABSPEC 1.012 01/10/2022 0757   LABSPEC 1.027 10/02/2012 0915   PHURINE 8.0 01/10/2022 0757   GLUCOSEU NEGATIVE 01/10/2022 0757   GLUCOSEU Negative 10/02/2012 0915   HGBUR MODERATE (A) 01/10/2022 0757   BILIRUBINUR NEGATIVE 01/10/2022 0757   BILIRUBINUR Negative 10/02/2012 0915   KETONESUR NEGATIVE 01/10/2022 0757   PROTEINUR 100 (A) 01/10/2022 0757   NITRITE NEGATIVE 01/10/2022 0757   LEUKOCYTESUR MODERATE (A) 01/10/2022 0757   LEUKOCYTESUR 1+ 10/02/2012 0915   Sepsis Labs Recent Labs  Lab 01/10/22 0757  01/11/22 0158 01/13/22 0420 01/14/22 0529  WBC 18.5* 15.4* 17.8* 11.8*   Microbiology Recent Results (from the past 240 hour(s))  Resp Panel by RT-PCR (Flu A&B, Covid) Anterior Nasal Swab     Status: None   Collection Time: 01/10/22  7:57 AM   Specimen: Anterior Nasal Swab  Result Value Ref Range Status   SARS Coronavirus 2 by RT PCR NEGATIVE NEGATIVE Final    Comment: (NOTE) SARS-CoV-2 target nucleic acids are NOT DETECTED.  The SARS-CoV-2 RNA is generally detectable in upper respiratory specimens during the acute phase of infection. The lowest concentration of SARS-CoV-2 viral copies this assay can detect is 138 copies/mL. A negative result does not preclude SARS-Cov-2 infection and should not be used as the sole basis for treatment or other patient management decisions. A negative result may occur with  improper specimen collection/handling, submission of specimen other than nasopharyngeal swab, presence of viral mutation(s) within the areas targeted by this assay, and inadequate number of viral copies(<138 copies/mL).  A negative result must be combined with clinical observations, patient history, and epidemiological information. The expected result is Negative.  Fact Sheet for Patients:  EntrepreneurPulse.com.au  Fact Sheet for Healthcare Providers:  IncredibleEmployment.be  This test is no t yet approved or cleared by the [place] FDA and  has been authorized for detection and/or diagnosis of SARS-CoV-2 by FDA under an Emergency Use Authorization (EUA). This EUA will remain  in effect (meaning this test can be used) for the duration of the COVID-19 declaration under Section 564(b)(1) of the Act, 21 U.S.C.section 360bbb-3(b)(1), unless the authorization is terminated  or revoked sooner.       Influenza A by PCR NEGATIVE NEGATIVE Final   Influenza B by PCR NEGATIVE NEGATIVE Final    Comment: (NOTE) The Xpert Xpress  SARS-CoV-2/FLU/RSV plus assay is intended as an aid in the diagnosis of influenza from Nasopharyngeal swab specimens and should not be used as a sole basis for treatment. Nasal washings and aspirates are unacceptable for Xpert Xpress SARS-CoV-2/FLU/RSV testing.  Fact Sheet for Patients: EntrepreneurPulse.com.au  Fact Sheet for Healthcare Providers: IncredibleEmployment.be  This test is not yet approved or cleared by the [place] FDA and has been authorized for detection and/or diagnosis of SARS-CoV-2 by FDA under an Emergency Use Authorization (EUA). This EUA will remain in effect (meaning this test can be used) for the duration of the COVID-19 declaration under Section 564(b)(1) of the Act, 21 U.S.C. section 360bbb-3(b)(1), unless the authorization is terminated or revoked.  Performed at [facility], [address] [place], [place] [address]   Blood Culture (routine x 2)     Status: None   Collection Time: 01/10/22  7:57 AM   Specimen: BLOOD  Result Value Ref Range Status   Specimen Description BLOOD BLOOD RIGHT FOREARM  Final   Special Requests   Final    BOTTLES DRAWN AEROBIC AND ANAEROBIC Blood Culture results may not be optimal due to an excessive volume of blood received in culture bottles   Culture   Final    NO GROWTH 5 DAYS Performed at [facility], [address], [place], [place] [address]    Report Status 01/15/2022 FINAL  Final  Blood Culture (routine x 2)     Status: None   Collection Time: 01/10/22  7:57 AM   Specimen: BLOOD  Result Value Ref Range Status   Specimen Description BLOOD LEFT ARM  Final   Special Requests   Final    BOTTLES DRAWN AEROBIC AND ANAEROBIC Blood Culture adequate volume   Culture   Final    NO GROWTH 5 DAYS Performed at [facility], [address] [place], [place] [address]    Report Status 01/15/2022 FINAL  Final  Urine Culture     Status: Abnormal    Collection Time: 01/10/22  7:57 AM   Specimen: In/Out Cath Urine  Result Value Ref Range Status   Specimen Description   Final    IN/OUT CATH URINE Performed at [facility], [address] [place],  [address]    Special Requests   Final    NONE Performed at [facility], [address] [place],  [address]    Culture MULTIPLE SPECIES PRESENT, SUGGEST RECOLLECTION (A)  Final   Report Status 01/11/2022 FINAL  Final  MRSA Next Gen by PCR, Nasal     Status: None   Collection Time: 01/10/22  1:42 PM   Specimen: Nasal Mucosa; Nasal Swab  Result Value  Ref Range Status   MRSA by PCR Next Gen NOT DETECTED NOT DETECTED Final    Comment: (NOTE) The GeneXpert MRSA Assay (FDA approved for NASAL specimens only), is one component of a comprehensive MRSA colonization surveillance program. It is not intended to diagnose MRSA infection nor to guide or monitor treatment for MRSA infections. Test performance is not FDA approved in patients less than 33 years old. Performed at [facility], [place], [place] [address]   C Difficile Quick Screen w PCR reflex     Status: None   Collection Time: 01/11/22  7:24 PM   Specimen: STOOL  Result Value Ref Range Status   C Diff antigen NEGATIVE NEGATIVE Final   C Diff toxin NEGATIVE NEGATIVE Final   C Diff interpretation No C. difficile detected.  Final    Comment: Performed at [facility], [address] [place], [place] [address]     Time coordinating discharge: Over 30 minutes  SIGNED:   Nolberto Hanlon, MD  [facility] 01/16/2022, 10:30 AM Pager   If 7PM-7AM, please contact night-coverage [email or web address] Password [email or web address]

## 2022-01-16 NOTE — Plan of Care (Signed)
  Problem: Elimination: Goal: Will not experience complications related to urinary retention Outcome: Progressing   Problem: Safety: Goal: Ability to remain free from injury will improve Outcome: Progressing   

## 2022-02-17 ENCOUNTER — Ambulatory Visit (INDEPENDENT_AMBULATORY_CARE_PROVIDER_SITE_OTHER): Payer: Medicare Other | Admitting: Physician Assistant

## 2022-02-17 DIAGNOSIS — R319 Hematuria, unspecified: Secondary | ICD-10-CM

## 2022-02-17 DIAGNOSIS — R339 Retention of urine, unspecified: Secondary | ICD-10-CM

## 2022-02-17 DIAGNOSIS — Z87442 Personal history of urinary calculi: Secondary | ICD-10-CM | POA: Diagnosis not present

## 2022-02-17 DIAGNOSIS — N401 Enlarged prostate with lower urinary tract symptoms: Secondary | ICD-10-CM | POA: Diagnosis not present

## 2022-02-17 DIAGNOSIS — R338 Other retention of urine: Secondary | ICD-10-CM

## 2022-02-17 MED ORDER — SULFAMETHOXAZOLE-TRIMETHOPRIM 800-160 MG PO TABS
1.0000 | ORAL_TABLET | Freq: Once | ORAL | Status: AC
  Administered 2022-02-17: 1 via ORAL

## 2022-02-17 NOTE — Progress Notes (Signed)
02/17/2022 9:27 AM   [name] 1941/02/14 [id number]  CC: Chief Complaint  Patient presents with   Urinary Retention   HPI: [name] is a 81 y.o. 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   He was last seen in our clinic by Michiel Cowboy on 07/26/2018 for follow-up.  At that time, plans were for him to proceed with monthly SP tube changes by home health.  He was subsequently lost to follow-up.  He was recently admitted with LLE cellulitis and sepsis of unclear LLE versus UTI origin.  Urine culture grew multiple species.  He was treated with vancomycin and cefepime.  He is now residing at [facility] but does not know how long he has resided there.  He does not recall the last time his SP tube was changed.  He is not aware that he has a history of nephrolithiasis.  He is accompanied today by his driver, who does not contribute to HPI.  He denies gross hematuria and flank pain.  PMH: Past Medical History:  Diagnosis Date   Bipolar 1 disorder (HCC)    C. difficile diarrhea 05/2016   history of ...   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   Schizophrenia (HCC)    Seizure (HCC)    on phenobarb   Urinary retention    frequently requiring foley placement   VRE (vancomycin resistant enterococcus) culture positive    urine    Surgical History: Past Surgical History:  Procedure Laterality Date   EXTRACORPOREAL SHOCK WAVE LITHOTRIPSY  Right 05/05/2017   Procedure: EXTRACORPOREAL SHOCK WAVE LITHOTRIPSY (ESWL);  Surgeon: Vanna Scotland, MD;  Location: [facility] ORS;  Service: Urology;  Laterality: Right;   IR CATHETER TUBE CHANGE  06/23/2017   IR CATHETER TUBE CHANGE  06/28/2017    Home Medications:  Allergies as of 02/17/2022       Reactions   Penicillin G Rash, Other (See Comments)   Convulsions.    Has patient had a PCN reaction causing immediate rash, facial/tongue/throat swelling, SOB or lightheadedness with hypotension: not sure Has patient had a PCN reaction causing severe rash involving mucus membranes or skin necrosis: not sure Has patient had a PCN reaction that required hospitalization: not sure Has patient had a PCN reaction occurring within the last 10 years: not sure If all of the above answers are "NO", then may proceed with Cephalosporin use.        Medication List        Accurate as of February 17, 2022  9:27 AM. If you have any questions, ask your nurse or doctor.          albuterol 108 (90 Base) MCG/ACT inhaler Commonly known as: VENTOLIN HFA Inhale 2 puffs into the lungs every 6 (six) hours as needed for wheezing or shortness of breath.   ARIPiprazole 20 MG tablet Commonly known as: ABILIFY Take 20 mg by mouth in the morning.   ascorbic  acid 500 MG tablet Commonly known as: VITAMIN C Take 1 tablet (500 mg total) by mouth 2 (two) times daily.   buPROPion 300 MG 24 hr tablet Commonly known as: Budeprion XL Take 1 tablet (300 mg total) by mouth daily.   carvedilol 3.125 MG tablet Commonly known as: COREG Take 8 tablets (25 mg total) by mouth 2 (two) times daily.   cetirizine 10 MG tablet Commonly known as: ZYRTEC Take 10 mg by mouth in the morning.   cholecalciferol 25 MCG (1000 UNIT) tablet Commonly known as: VITAMIN D3 Take 1,000 Units by mouth in the morning.   Cranberry 250 MG Caps Take 250 mg by mouth 2 (two) times daily.   ferrous sulfate 325 (65 FE) MG tablet Take 325 mg by  mouth daily with breakfast.   finasteride 5 MG tablet Commonly known as: PROSCAR Take 1 tablet (5 mg total) by mouth daily.   folic acid 1 MG tablet Commonly known as: FOLVITE Take 1 mg by mouth in the morning.   furosemide 40 MG tablet Commonly known as: LASIX Take 0.5 tablets (20 mg total) by mouth daily.   Multi-Vitamins Tabs Take 1 tablet by mouth daily.   PHENobarbital 64.8 MG tablet Commonly known as: LUMINAL Take 64.8 mg by mouth every other day.   potassium chloride 10 MEQ tablet Commonly known as: KLOR-CON M Take 1 tablet (10 mEq total) by mouth daily. When taking lasix   pravastatin 40 MG tablet Commonly known as: PRAVACHOL Take 1 tablet (40 mg total) by mouth daily.   Suvorexant 5 MG Tabs Take 5 mg by mouth at bedtime.   zinc oxide 20 % ointment Apply 1 application. topically 2 (two) times daily. (Apply to legs)        Allergies:  Allergies  Allergen Reactions   Penicillin G Rash and Other (See Comments)    Convulsions.    Has patient had a PCN reaction causing immediate rash, facial/tongue/throat swelling, SOB or lightheadedness with hypotension: not sure Has patient had a PCN reaction causing severe rash involving mucus membranes or skin necrosis: not sure Has patient had a PCN reaction that required hospitalization: not sure Has patient had a PCN reaction occurring within the last 10 years: not sure If all of the above answers are "NO", then may proceed with Cephalosporin use.    Family History: Family History  Problem Relation Age of Onset   Cancer Mother    Prostate cancer Neg Hx    Kidney disease Neg Hx    Kidney cancer Neg Hx    Bladder Cancer Neg Hx     Social History:   reports that he has never smoked. He has never used smokeless tobacco. He reports that he does not drink alcohol and does not use drugs.  Physical Exam: There were no vitals taken for this visit.  Constitutional:  Alert, no acute distress, nontoxic appearing HEENT:  [place], AT Cardiovascular: No clubbing, cyanosis, or edema Respiratory: Normal respiratory effort, no increased work of breathing GU: SPT insertion site without surrounding erythema.  The site is dry with no evidence of leaking or protruding granulation tissue.  16 French silicone catheter emanating from the SP tract with visible encrustation throughout. Skin: No rashes, bruises or suspicious lesions Neurologic: Grossly intact, no focal deficits, moving all 4 extremities Psychiatric: Psychomotor retardation  Suprapubic Cath Change  Patient is present today for a suprapubic catheter change due to urinary retention.  70ml of water was drained from the balloon, a 16FR  silicone foley cath was removed from the tract without difficulty.  Site was cleaned and prepped in a sterile fashion with betadine.  A 16FR Silastic foley cath was replaced into the tract no complications were noted. Urine return was noted, 10 ml of sterile water was inflated into the balloon and a night bag was attached for drainage.  Patient tolerated well.     Performed by: Debroah Loop, PA-C and Bradly Bienenstock, CMA  Assessment & Plan:   1. Urinary retention History today is extremely challenging.  Unclear when his last SP tube change was performed, however it does appear to be well maintained on exam today.  We treated him with 1 dose of p.o. Bactrim prior to suprapubic catheter change as above.  We will plan for monthly SP tube changes in clinic moving forward. - sulfamethoxazole-trimethoprim (BACTRIM DS) 800-160 MG per tablet 1 tablet  2. History of nephrolithiasis He denies a history of flank pain, however he has a history of growing rather large renal stones and as above history was very challenging.  Given that he also has a history of hematuria, I recommend proceeding with CT urogram, see #4 below.  3. Benign prostatic hyperplasia with urinary retention Okay to continue finasteride and suprapubic catheter.  4.  Hematuria, unspecified type Given duration during which she was lost to follow-up, I recommend repeating hematuria work-up at this time to reassess.  CTU ordered.  We will plan for cystoscopy and CT results at his next SP tube change in 1 month. - CT HEMATURIA WORKUP; Future  Return in about 4 weeks (around 03/17/2022) for Cysto, CTU results, and SPT change with Dr. Bernardo Heater.  Debroah Loop, PA-C  [facility] [address], [place], [place] [address] [phone]

## 2022-03-18 ENCOUNTER — Ambulatory Visit (INDEPENDENT_AMBULATORY_CARE_PROVIDER_SITE_OTHER): Payer: Medicare Other | Admitting: Urology

## 2022-03-18 ENCOUNTER — Encounter: Payer: Self-pay | Admitting: Urology

## 2022-03-18 VITALS — BP 106/67 | HR 74 | Ht 70.0 in | Wt 300.0 lb

## 2022-03-18 DIAGNOSIS — R319 Hematuria, unspecified: Secondary | ICD-10-CM | POA: Diagnosis not present

## 2022-03-18 MED ORDER — SULFAMETHOXAZOLE-TRIMETHOPRIM 800-160 MG PO TABS
1.0000 | ORAL_TABLET | Freq: Once | ORAL | Status: AC
  Administered 2022-03-18: 1 via ORAL

## 2022-03-18 NOTE — Patient Instructions (Signed)
Appointment scheduled for [facility] imaging for a CT scan arrive at 2:00

## 2022-03-18 NOTE — Progress Notes (Signed)
Cath Change/ Replacement  Patient is present today for a catheter change due to urinary retention.  59ml of water was removed from the balloon, a 16 silastic foley cath was removed with out difficulty.  Patient was cleaned and prepped in a sterile fashion with betadine. A 16 FR foley cath was replaced into the bladder no complications were noted Urine return was noted and urine was clear in color. The balloon was filled with 15ml of sterile water. A night bag was attached for drainage.  Patient was given proper instruction on catheter care.    Performed by: Teressa Lower, CMA

## 2022-03-18 NOTE — Progress Notes (Signed)
   03/18/22  CC:  Chief Complaint  Patient presents with   Cysto    HPI: Refer to Sam Vaillancourt's previous note 02/17/2022.  CT urogram was not ordered.  Blood pressure 106/67, pulse 74, height 5\' 10"  (1.778 m), weight 300 lb (136.1 kg).   Cystoscopy Procedure Note  Patient identification was confirmed, informed consent was obtained, and patient was prepped using Betadine solution at the [facility] site.       Pre-Procedure: - Inspection reveals a normal appearing SP site.  Procedure: The flexible cystoscope was introduced via the suprapubic tract without difficulty -  Mild lateral lobe enlargement  prostate  - Normal bladder neck - Bilateral ureteral orifices identified - Bladder mucosa  reveals no solid or papillary lesions - No bladder stones -Moderate trabeculation -Inflammatory mucosal changes superior bladder at site of Foley balloon   Post-Procedure: -Tolerated procedure well  Assessment/ Plan: No solid or papillary tumors noted SP tube was changed Will schedule CT urogram    [facility], MD

## 2022-03-25 ENCOUNTER — Ambulatory Visit
Admission: RE | Admit: 2022-03-25 | Discharge: 2022-03-25 | Disposition: A | Discharge status: Home / Self Care | Payer: Medicare Other | Source: Ambulatory Visit | Attending: Physician Assistant | Admitting: Physician Assistant

## 2022-03-25 DIAGNOSIS — R319 Hematuria, unspecified: Secondary | ICD-10-CM | POA: Diagnosis not present

## 2022-03-25 LAB — POCT I-STAT CREATININE: Creatinine, Ser: 1.3 mg/dL — ABNORMAL HIGH (ref 0.61–1.24)

## 2022-03-25 MED ORDER — IOHEXOL 350 MG/ML SOLN
100.0000 mL | Freq: Once | INTRAVENOUS | Status: AC | PRN
  Administered 2022-03-25: 100 mL via INTRAVENOUS

## 2022-03-29 ENCOUNTER — Telehealth: Payer: Self-pay | Admitting: Physician Assistant

## 2022-03-29 NOTE — Telephone Encounter (Signed)
Per Nestor Ramp at [facility], patient does NOT have a HCPOA. He was signed into [facility] by [place] from [place] [facility], who also stated he was not the patient's POA. No known next of kin. They have considered getting him a guardian appointed by the county and I have requested that they expedite this for medical reasons and keep me posted on progress.  Doristine Mango from [place] [employer] can be reached at [phone].

## 2022-03-29 NOTE — Telephone Encounter (Signed)
Just LMOM with [facility] to identify patient's HCPOA to discuss CTU results.

## 2022-04-02 ENCOUNTER — Ambulatory Visit (INDEPENDENT_AMBULATORY_CARE_PROVIDER_SITE_OTHER): Payer: Medicare Other | Admitting: Physician Assistant

## 2022-04-02 ENCOUNTER — Encounter: Payer: Self-pay | Admitting: Physician Assistant

## 2022-04-02 VITALS — BP 115/70 | HR 64

## 2022-04-02 DIAGNOSIS — N210 Calculus in bladder: Secondary | ICD-10-CM

## 2022-04-02 DIAGNOSIS — R338 Other retention of urine: Secondary | ICD-10-CM

## 2022-04-02 DIAGNOSIS — T83098A Other mechanical complication of other indwelling urethral catheter, initial encounter: Secondary | ICD-10-CM

## 2022-04-02 DIAGNOSIS — R4189 Other symptoms and signs involving cognitive functions and awareness: Secondary | ICD-10-CM | POA: Diagnosis not present

## 2022-04-02 DIAGNOSIS — K629 Disease of anus and rectum, unspecified: Secondary | ICD-10-CM

## 2022-04-02 DIAGNOSIS — N200 Calculus of kidney: Secondary | ICD-10-CM

## 2022-04-02 DIAGNOSIS — N202 Calculus of kidney with calculus of ureter: Secondary | ICD-10-CM

## 2022-04-02 DIAGNOSIS — N1330 Unspecified hydronephrosis: Secondary | ICD-10-CM

## 2022-04-02 NOTE — Patient Instructions (Signed)
[name] has very limited insight today. I do not think he has capacity to be making his own medical decisions. He himself reports that he does not make his own decisions, and that "the nurse" does it. He has a very complex urologic history for which I am referring him to [facility] today. I strongly urge you to pursue a capacity evaluation with guardianship appointment over this patient as soon as possible.

## 2022-04-02 NOTE — Progress Notes (Signed)
04/02/2022 11:42 AM   [name] [birth year] [id number]  CC: Chief Complaint  Patient presents with   Follow-up    Cath change, cath clogged   HPI: [name] is a 81 y.o. male with PMH schizophrenia, BPH on finasteride, urinary retention managed with SP tube after developing urethral erosion with Foley catheter, nephrolithiasis, and gross hematuria who recently reestablished care with [place] after being lost to follow-up for 4 years who presents today for evaluation of nonfunctioning suprapubic catheter.  He newly resides at [facility], previously at [facility].  He was seen in clinic by Dr. Lonna [name] 15 days ago for cystoscopy, which was benign.  SP tube was replaced at that time.  He is on my schedule today with reports of nondraining suprapubic catheter.  Staff at [facility] has attempted to irrigate the catheter but been unable to do so.  Since he was last seen in clinic, he underwent CT urogram which was notable for extensive, bilateral bulky stone disease with an interpolar right renal staghorn stone, right UVJ stone, left staghorn stone projecting into the UPJ with associated mild to moderate left hydronephrosis, and bladder stone.  There was also noted to be rectal wall thickening of unclear etiology.  On questioning, patient reports no flank or abdominal pain.  He states that his catheter stopped draining 2 weeks ago.  He has extremely poor insight and does not recall his history of stone disease.  He does not recall undergoing a CT scan 8 days ago.  Even though he has no HCPOA, he states he does not make his own medical decisions and that "the nurse" does it for him.  He is rather perseverant on the concept of pain and whether interventions will cause him pain.  He is accompanied today by his driver, who is unable to contribute to HPI.  PMH: Past Medical History:  Diagnosis Date   Bipolar 1 disorder (HCC)    C. difficile diarrhea 05/2016   history of ...    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   Schizophrenia (HCC)    Seizure (HCC)    on phenobarb   Urinary retention    frequently requiring foley placement   VRE (vancomycin resistant enterococcus) culture positive    urine    Surgical History: Past Surgical History:  Procedure Laterality Date   EXTRACORPOREAL SHOCK WAVE LITHOTRIPSY Right 05/05/2017   Procedure: EXTRACORPOREAL SHOCK WAVE LITHOTRIPSY (ESWL);  Surgeon: [name] Scotland, MD;  Location: [facility] ORS;  Service: Urology;  Laterality: Right;   IR CATHETER TUBE CHANGE  06/23/2017   IR CATHETER TUBE CHANGE  06/28/2017    Home Medications:  Allergies as of 04/02/2022       Reactions   Penicillin G Rash, Other (See Comments)   Convulsions.    Has patient had a PCN reaction causing immediate rash, facial/tongue/throat swelling, SOB or lightheadedness with hypotension: not sure Has patient had a PCN reaction causing severe rash involving mucus membranes or skin necrosis: not sure Has patient had a PCN reaction that required hospitalization: not sure Has patient had a  PCN reaction occurring within the last 10 years: not sure If all of the above answers are "NO", then may proceed with Cephalosporin use.        Medication List        Accurate as of April 02, 2022 11:42 AM. If you have any questions, ask your nurse or doctor.          albuterol 108 (90 Base) MCG/ACT inhaler Commonly known as: VENTOLIN HFA Inhale 2 puffs into the lungs every 6 (six) hours as needed for wheezing or shortness of breath.   ARIPiprazole 20 MG tablet Commonly known as: ABILIFY Take 20 mg by mouth in the  morning.   ascorbic acid 500 MG tablet Commonly known as: VITAMIN C Take 1 tablet (500 mg total) by mouth 2 (two) times daily.   buPROPion 300 MG 24 hr tablet Commonly known as: Budeprion XL Take 1 tablet (300 mg total) by mouth daily.   carvedilol 3.125 MG tablet Commonly known as: COREG Take 8 tablets (25 mg total) by mouth 2 (two) times daily.   cetirizine 10 MG tablet Commonly known as: ZYRTEC Take 10 mg by mouth in the morning.   cholecalciferol 25 MCG (1000 UNIT) tablet Commonly known as: VITAMIN D3 Take 1,000 Units by mouth in the morning.   Cranberry 250 MG Caps Take 250 mg by mouth 2 (two) times daily.   ferrous sulfate 325 (65 FE) MG tablet Take 325 mg by mouth daily with breakfast.   finasteride 5 MG tablet Commonly known as: PROSCAR Take 1 tablet (5 mg total) by mouth daily.   folic acid 1 MG tablet Commonly known as: FOLVITE Take 1 mg by mouth in the morning.   furosemide 40 MG tablet Commonly known as: LASIX Take 0.5 tablets (20 mg total) by mouth daily.   Multi-Vitamins Tabs Take 1 tablet by mouth daily.   PHENobarbital 64.8 MG tablet Commonly known as: LUMINAL Take 64.8 mg by mouth every other day.   potassium chloride 10 MEQ tablet Commonly known as: KLOR-CON M Take 1 tablet (10 mEq total) by mouth daily. When taking lasix   pravastatin 40 MG tablet Commonly known as: PRAVACHOL Take 1 tablet (40 mg total) by mouth daily.   Suvorexant 5 MG Tabs Take 5 mg by mouth at bedtime.   zinc oxide 20 % ointment Apply 1 application. topically 2 (two) times daily. (Apply to legs)        Allergies:  Allergies  Allergen Reactions   Penicillin G Rash and Other (See Comments)    Convulsions.    Has patient had a PCN reaction causing immediate rash, facial/tongue/throat swelling, SOB or lightheadedness with hypotension: not sure Has patient had a PCN reaction causing severe rash involving mucus membranes or skin necrosis: not sure Has patient had  a PCN reaction that required hospitalization: not sure Has patient had a PCN reaction occurring within the last 10 years: not sure If all of the above answers are "NO", then may proceed with Cephalosporin use.    Family History: Family History  Problem Relation Age of Onset   Cancer Mother    Prostate cancer Neg Hx    Kidney disease Neg Hx    Kidney cancer Neg Hx    Bladder Cancer Neg Hx     Social History:   reports that he has never smoked. He has never been exposed to tobacco smoke. He has never used smokeless tobacco. He reports that he does not drink alcohol and does  not use drugs.  Physical Exam: BP 115/70   Pulse 64   Constitutional:  Alert, no acute distress, nontoxic appearing HEENT: , AT Cardiovascular: No clubbing, cyanosis, or edema Respiratory: Normal respiratory effort, no increased work of breathing Skin: No rashes, bruises or suspicious lesions Neurologic: Grossly intact, no focal deficits, moving all 4 extremities Psychiatric: Flat affect  Pertinent Imaging: Results for orders placed during the hospital encounter of 03/25/22  CT HEMATURIA WORKUP  Narrative CLINICAL DATA:  Hematuria.  EXAM: CT ABDOMEN AND PELVIS WITHOUT AND WITH CONTRAST  TECHNIQUE: Multidetector CT imaging of the abdomen and pelvis was performed following the standard protocol before and following the bolus administration of intravenous contrast.  RADIATION DOSE REDUCTION: This exam was performed according to the departmental dose-optimization program which includes automated exposure control, adjustment of the mA and/or kV according to patient size and/or use of iterative reconstruction technique.  CONTRAST:  OMNIPAQUE IOHEXOL 350 MG/ML SOLN  COMPARISON:  12/23/2016  FINDINGS: Lower chest: Dependent atelectasis noted in both lower lobes. Coronary artery calcification is evident. Aortic valve and mitral annular calcification evident.  Hepatobiliary: No suspicious  focal abnormality within the liver parenchyma. There is no evidence for gallstones, gallbladder wall thickening, or pericholecystic fluid. No intrahepatic or extrahepatic biliary dilation.  Pancreas: No focal mass lesion. No dilatation of the main duct. No intraparenchymal cyst. No peripancreatic edema.  Spleen: No splenomegaly. No focal mass lesion.  Adrenals/Urinary Tract: No adrenal nodule or mass. Bulky stone disease is seen in both kidneys index interpolar right renal staghorn calculus measures approximately 18 x 14 x 23 mm. A 12 x 19 x 13 mm stone is seen in the right renal pelvis at the UPJ. Immediately adjacent to this dominant pelvic stone is a second smaller 10 x 13 x 9 mm stone in the right renal pelvis. 5 mm stone identified at the right UVJ (85/2).  Multiple stones are also seen in the left kidney and renal pelvis. Staghorn calculus in the renal pelvis projects down into the UPJ measuring 10 x 23 x 17 mm. There is an adjacent stone in the lower pole collecting system near the confluence with the pelvis that measures approximately 8 x 17 x 15 mm. Additional prominent left renal stones evident. No evidence for left ureteral stone.  9 x 4 mm stone is seen free in the bladder lumen.  Imaging after IV contrast administration shows no suspicious enhancing lesion in either kidney. There is mild to moderate left hydronephrosis. 7.0 cm simple cyst noted posterior interpolar right kidney. 17 mm hypoattenuating lesion in the anterior interpolar left kidney has attenuation higher than would be expected for a simple cyst but is not substantially changed since 12/23/2016 consistent with benign etiology. Tiny hypoattenuating lesions in both kidneys are too small to characterize.  No wall thickening or soft tissue filling defect identified in either intrarenal collecting system or renal pelvis. Left anterior renal calices are incompletely opacified which limits assessment.  No evidence for ureteral wall thickening or focal hydroureter with incomplete opacification of both ureters. No gross mucosal or bladder wall abnormality although there is some bladder wall thickening at the site of the suprapubic tube insertion.  Stomach/Bowel: Stomach is nondistended. Duodenum is normally positioned as is the ligament of Treitz. No small bowel wall thickening. No small bowel dilatation. The terminal ileum is normal. The appendix is normal. Sigmoid colon is markedly elongated and redundant with associated gaseous distension. No evidence for colonic obstruction. Circumferential wall thickening noted in the  rectum (image [phone]) with perirectal edema/inflammation.  Vascular/Lymphatic: There is mild atherosclerotic calcification of the abdominal aorta without aneurysm. There is no gastrohepatic or hepatoduodenal ligament lymphadenopathy. No retroperitoneal or mesenteric lymphadenopathy. No pelvic sidewall lymphadenopathy. 9 mm short axis right external iliac node on 72/3 is not substantially changed in the interval.  Reproductive: Unremarkable  Other: No intraperitoneal free fluid.  Musculoskeletal: Bones are diffusely demineralized No worrisome lytic or sclerotic osseous abnormality.  IMPRESSION: 1. Bilateral nephrolithiasis with mild to moderate left hydronephrosis secondary to a stone at the left UPJ. 2. 5 mm stone identified at the right UVJ without substantial right hydroureteronephrosis. 3. 9 x 4 mm stone free in the bladder lumen. 4. Circumferential wall thickening in the rectum with perirectal edema/inflammation. Imaging features suggest infectious/inflammatory proctitis. Neoplasm not excluded. 5. Aortic Atherosclerosis (ICD10-I70.0).   Electronically Signed By: Kennith Center M.D. On: 03/27/2022 13:37  No results found for this or any previous visit.  I personally reviewed the images referenced above and note extensive bilateral nephrolithiasis  including bilateral staghorn stones, left hydronephrosis, a distal right ureteral stone, and bladder stone.  Suprapubic Cath Change  Patient is present today for a suprapubic catheter change due to urinary retention.  42ml of water was drained from the balloon, a 16FR foley cath was removed from the tract without difficulty.  Site was cleaned and prepped in a sterile fashion with betadine.  A 18FR Silastic foley cath was replaced into the tract no complications were noted. Urine return was noted, 10 ml of sterile water was inflated into the balloon and a night bag was attached for drainage.  Patient tolerated well.     Performed by: Carman Ching, PA-C and Mervin Hack, CMA  Assessment & Plan:   1. Retention of urine due to occlusion of Foley catheter [facility]) Suprapubic catheter exchanged in clinic, see procedure note above.  Patient tolerated well.  New catheter immediately drained approximately 200 cc of urine.  2. Nephrolithiasis Extensive bulky stone disease on recent CTU with bilateral staghorn stones, left hydronephrosis, distal right ureteral stone, and bladder stone.  I attempted to explain these findings to the patient in clinic today, but due to #3 below I am very concerned that he did not fully understand.  His imaging has been reviewed by me, Dr. Lonna [name], and Dr. Apolinar Junes, who feel that due to his extensive stone burden, he would be best served at a tertiary care center.  I am placing a referral to [facility] urology today for this.  Further urologic care will be complicated due to #3 below.  He remains asymptomatic with relatively stable creatinine, so will defer intervention for his left hydronephrosis while he awaits referral. - Ambulatory referral to Urology  3. Impaired insight Patient has extremely poor insight today, cannot recall recent events, does not recall his own medical history, and states that he does not make his own medical decisions despite not having a healthcare  power of attorney to do so on his behalf.  We do not have a DPR that lists anyone we can speak to on his behalf and in fact he is listed as his own next of kin on paperwork from [facility].    I am extremely concerned that he lacks capacity to make his own medical decisions and that him not having a healthcare proxy is a major oversight.  I have shared these concerns with the staff at [facility] via telephone (see prior phone note) and was told that they would follow-up  with the social worker at that facility for consideration of guardianship.  I also reiterated my concerns in his AVS today. Will continue to follow.  4. Rectal abnormality Rectal wall thickening on recent CTU of indeterminate etiology. I spoke with [facility] staff via telephone; new PCP is Dr. Prudence Davidson. I updated the patient's chart to reflect this, notified her PA, Faith, about this incidental finding, faxed a copy of the radiology report to their number, and shared my concerns regarding #3 above.   Return in about 4 weeks (around 04/30/2022) for SPT exchange.  Carman Ching, PA-C  [facility] [address], [address] [place], [place] [address] [phone]

## 2022-05-02 ENCOUNTER — Emergency Department: Payer: Medicare Other

## 2022-05-02 ENCOUNTER — Inpatient Hospital Stay
Admission: EM | Admit: 2022-05-02 | Discharge: 2022-05-17 | DRG: 981 | Disposition: A | Discharge status: Skilled Nursing Facility | Payer: Medicare Other | Source: Skilled Nursing Facility | Attending: Internal Medicine | Admitting: Internal Medicine

## 2022-05-02 DIAGNOSIS — I700 Atherosclerosis of aorta: Secondary | ICD-10-CM | POA: Diagnosis present

## 2022-05-02 DIAGNOSIS — L89322 Pressure ulcer of left buttock, stage 2: Secondary | ICD-10-CM | POA: Diagnosis present

## 2022-05-02 DIAGNOSIS — Z515 Encounter for palliative care: Secondary | ICD-10-CM | POA: Diagnosis not present

## 2022-05-02 DIAGNOSIS — F319 Bipolar disorder, unspecified: Secondary | ICD-10-CM | POA: Diagnosis present

## 2022-05-02 DIAGNOSIS — L8990 Pressure ulcer of unspecified site, unspecified stage: Secondary | ICD-10-CM | POA: Diagnosis present

## 2022-05-02 DIAGNOSIS — E872 Acidosis, unspecified: Secondary | ICD-10-CM | POA: Diagnosis present

## 2022-05-02 DIAGNOSIS — F32A Depression, unspecified: Secondary | ICD-10-CM | POA: Diagnosis present

## 2022-05-02 DIAGNOSIS — E878 Other disorders of electrolyte and fluid balance, not elsewhere classified: Secondary | ICD-10-CM | POA: Diagnosis present

## 2022-05-02 DIAGNOSIS — K562 Volvulus: Secondary | ICD-10-CM | POA: Diagnosis not present

## 2022-05-02 DIAGNOSIS — D649 Anemia, unspecified: Secondary | ICD-10-CM | POA: Diagnosis present

## 2022-05-02 DIAGNOSIS — I110 Hypertensive heart disease with heart failure: Secondary | ICD-10-CM | POA: Diagnosis present

## 2022-05-02 DIAGNOSIS — E870 Hyperosmolality and hypernatremia: Secondary | ICD-10-CM | POA: Diagnosis present

## 2022-05-02 DIAGNOSIS — J690 Pneumonitis due to inhalation of food and vomit: Secondary | ICD-10-CM | POA: Diagnosis not present

## 2022-05-02 DIAGNOSIS — I82402 Acute embolism and thrombosis of unspecified deep veins of left lower extremity: Secondary | ICD-10-CM | POA: Diagnosis not present

## 2022-05-02 DIAGNOSIS — Z7189 Other specified counseling: Secondary | ICD-10-CM | POA: Diagnosis not present

## 2022-05-02 DIAGNOSIS — I959 Hypotension, unspecified: Secondary | ICD-10-CM | POA: Diagnosis not present

## 2022-05-02 DIAGNOSIS — N179 Acute kidney failure, unspecified: Secondary | ICD-10-CM | POA: Diagnosis present

## 2022-05-02 DIAGNOSIS — I2510 Atherosclerotic heart disease of native coronary artery without angina pectoris: Secondary | ICD-10-CM | POA: Diagnosis present

## 2022-05-02 DIAGNOSIS — Z809 Family history of malignant neoplasm, unspecified: Secondary | ICD-10-CM

## 2022-05-02 DIAGNOSIS — K922 Gastrointestinal hemorrhage, unspecified: Secondary | ICD-10-CM | POA: Diagnosis not present

## 2022-05-02 DIAGNOSIS — L89312 Pressure ulcer of right buttock, stage 2: Secondary | ICD-10-CM | POA: Diagnosis present

## 2022-05-02 DIAGNOSIS — K219 Gastro-esophageal reflux disease without esophagitis: Secondary | ICD-10-CM | POA: Diagnosis present

## 2022-05-02 DIAGNOSIS — G40909 Epilepsy, unspecified, not intractable, without status epilepticus: Secondary | ICD-10-CM | POA: Diagnosis present

## 2022-05-02 DIAGNOSIS — J9601 Acute respiratory failure with hypoxia: Secondary | ICD-10-CM | POA: Diagnosis present

## 2022-05-02 DIAGNOSIS — N390 Urinary tract infection, site not specified: Secondary | ICD-10-CM | POA: Diagnosis present

## 2022-05-02 DIAGNOSIS — R1310 Dysphagia, unspecified: Secondary | ICD-10-CM | POA: Diagnosis not present

## 2022-05-02 DIAGNOSIS — I82411 Acute embolism and thrombosis of right femoral vein: Secondary | ICD-10-CM | POA: Diagnosis present

## 2022-05-02 DIAGNOSIS — I5032 Chronic diastolic (congestive) heart failure: Secondary | ICD-10-CM | POA: Diagnosis present

## 2022-05-02 DIAGNOSIS — Z86718 Personal history of other venous thrombosis and embolism: Secondary | ICD-10-CM

## 2022-05-02 DIAGNOSIS — E669 Obesity, unspecified: Secondary | ICD-10-CM | POA: Diagnosis present

## 2022-05-02 DIAGNOSIS — E440 Moderate protein-calorie malnutrition: Secondary | ICD-10-CM | POA: Diagnosis present

## 2022-05-02 DIAGNOSIS — Z880 Allergy status to penicillin: Secondary | ICD-10-CM

## 2022-05-02 DIAGNOSIS — I82403 Acute embolism and thrombosis of unspecified deep veins of lower extremity, bilateral: Secondary | ICD-10-CM | POA: Diagnosis present

## 2022-05-02 DIAGNOSIS — F209 Schizophrenia, unspecified: Secondary | ICD-10-CM | POA: Diagnosis present

## 2022-05-02 DIAGNOSIS — E782 Mixed hyperlipidemia: Secondary | ICD-10-CM | POA: Diagnosis present

## 2022-05-02 DIAGNOSIS — Z20822 Contact with and (suspected) exposure to covid-19: Secondary | ICD-10-CM | POA: Diagnosis present

## 2022-05-02 DIAGNOSIS — I2699 Other pulmonary embolism without acute cor pulmonale: Secondary | ICD-10-CM | POA: Diagnosis not present

## 2022-05-02 DIAGNOSIS — R54 Age-related physical debility: Secondary | ICD-10-CM | POA: Diagnosis present

## 2022-05-02 DIAGNOSIS — J441 Chronic obstructive pulmonary disease with (acute) exacerbation: Secondary | ICD-10-CM | POA: Diagnosis present

## 2022-05-02 DIAGNOSIS — I10 Essential (primary) hypertension: Secondary | ICD-10-CM | POA: Diagnosis present

## 2022-05-02 DIAGNOSIS — Z9151 Personal history of suicidal behavior: Secondary | ICD-10-CM

## 2022-05-02 DIAGNOSIS — E876 Hypokalemia: Secondary | ICD-10-CM | POA: Diagnosis not present

## 2022-05-02 DIAGNOSIS — N400 Enlarged prostate without lower urinary tract symptoms: Secondary | ICD-10-CM | POA: Diagnosis present

## 2022-05-02 DIAGNOSIS — E875 Hyperkalemia: Secondary | ICD-10-CM | POA: Diagnosis not present

## 2022-05-02 DIAGNOSIS — Z7401 Bed confinement status: Secondary | ICD-10-CM

## 2022-05-02 DIAGNOSIS — Z1330 Encounter for screening examination for mental health and behavioral disorders, unspecified: Secondary | ICD-10-CM | POA: Diagnosis not present

## 2022-05-02 DIAGNOSIS — I872 Venous insufficiency (chronic) (peripheral): Secondary | ICD-10-CM | POA: Diagnosis present

## 2022-05-02 DIAGNOSIS — Z6835 Body mass index (BMI) 35.0-35.9, adult: Secondary | ICD-10-CM | POA: Diagnosis not present

## 2022-05-02 DIAGNOSIS — R0602 Shortness of breath: Secondary | ICD-10-CM | POA: Diagnosis present

## 2022-05-02 LAB — CBC WITH DIFFERENTIAL/PLATELET
Abs Immature Granulocytes: 0.02 10*3/uL (ref 0.00–0.07)
Basophils Absolute: 0 10*3/uL (ref 0.0–0.1)
Basophils Relative: 0 %
Eosinophils Absolute: 0 10*3/uL (ref 0.0–0.5)
Eosinophils Relative: 0 %
HCT: 43.1 % (ref 39.0–52.0)
Hemoglobin: 13.7 g/dL (ref 13.0–17.0)
Immature Granulocytes: 0 %
Lymphocytes Relative: 18 %
Lymphs Abs: 1.4 10*3/uL (ref 0.7–4.0)
MCH: 27.6 pg (ref 26.0–34.0)
MCHC: 31.8 g/dL (ref 30.0–36.0)
MCV: 86.7 fL (ref 80.0–100.0)
Monocytes Absolute: 0.3 10*3/uL (ref 0.1–1.0)
Monocytes Relative: 4 %
Neutro Abs: 6.3 10*3/uL (ref 1.7–7.7)
Neutrophils Relative %: 78 %
Platelets: 273 10*3/uL (ref 150–400)
RBC: 4.97 MIL/uL (ref 4.22–5.81)
RDW: 14.8 % (ref 11.5–15.5)
WBC: 8.1 10*3/uL (ref 4.0–10.5)
nRBC: 0 % (ref 0.0–0.2)

## 2022-05-02 LAB — COMPREHENSIVE METABOLIC PANEL
ALT: 10 U/L (ref 0–44)
AST: 16 U/L (ref 15–41)
Albumin: 3.3 g/dL — ABNORMAL LOW (ref 3.5–5.0)
Alkaline Phosphatase: 100 U/L (ref 38–126)
Anion gap: 10 (ref 5–15)
BUN: 14 mg/dL (ref 8–23)
CO2: 27 mmol/L (ref 22–32)
Calcium: 8.7 mg/dL — ABNORMAL LOW (ref 8.9–10.3)
Chloride: 107 mmol/L (ref 98–111)
Creatinine, Ser: 1.19 mg/dL (ref 0.61–1.24)
GFR, Estimated: >60 mL/min (ref >60)
Glucose, Bld: 143 mg/dL — ABNORMAL HIGH (ref 70–99)
Potassium: 2.9 mmol/L — ABNORMAL LOW (ref 3.5–5.1)
Sodium: 144 mmol/L (ref 135–145)
Total Bilirubin: 0.9 mg/dL (ref 0.3–1.2)
Total Protein: 8.1 g/dL (ref 6.5–8.1)

## 2022-05-02 LAB — SARS CORONAVIRUS 2 BY RT PCR: SARS Coronavirus 2 by RT PCR: NEGATIVE

## 2022-05-02 LAB — BLOOD GAS, VENOUS
Acid-base deficit: 1.3 mmol/L (ref 0.0–2.0)
Bicarbonate: 23.7 mmol/L (ref 20.0–28.0)
Delivery systems: POSITIVE
FIO2: 40 %
O2 Saturation: 78.1 %
Patient temperature: 37
pCO2, Ven: 40 mmHg — ABNORMAL LOW (ref 44–60)
pH, Ven: 7.38 (ref 7.25–7.43)
pO2, Ven: 44 mmHg (ref 32–45)

## 2022-05-02 LAB — PROTIME-INR
INR: 1.2 (ref 0.8–1.2)
Prothrombin Time: 14.6 seconds (ref 11.4–15.2)

## 2022-05-02 LAB — APTT: aPTT: 30 seconds (ref 24–36)

## 2022-05-02 LAB — LACTIC ACID, PLASMA: Lactic Acid, Venous: 1.5 mmol/L (ref 0.5–1.9)

## 2022-05-02 LAB — BRAIN NATRIURETIC PEPTIDE: B Natriuretic Peptide: 318.6 pg/mL — ABNORMAL HIGH (ref 0.0–100.0)

## 2022-05-02 MED ORDER — IPRATROPIUM-ALBUTEROL 0.5-2.5 (3) MG/3ML IN SOLN
3.0000 mL | Freq: Once | RESPIRATORY_TRACT | Status: AC
  Administered 2022-05-02: 3 mL via RESPIRATORY_TRACT

## 2022-05-02 MED ORDER — ACETAMINOPHEN 650 MG RE SUPP: 650.0000 mg | Freq: Four times a day (QID) | RECTAL | Status: DC | PRN

## 2022-05-02 MED ORDER — SODIUM CHLORIDE 0.9% FLUSH: 3.0000 mL | INTRAVENOUS | Status: DC | PRN

## 2022-05-02 MED ORDER — CARVEDILOL 25 MG PO TABS
25.0000 mg | ORAL_TABLET | Freq: Two times a day (BID) | ORAL | Status: DC
  Administered 2022-05-03 – 2022-05-04 (×2): 25 mg via ORAL
  Filled 2022-05-02: qty 1
  Filled 2022-05-02: qty 2
  Filled 2022-05-02: qty 1

## 2022-05-02 MED ORDER — ADULT MULTIVITAMIN W/MINERALS CH
1.0000 | ORAL_TABLET | Freq: Every day | ORAL | Status: DC
  Administered 2022-05-04: 1 via ORAL
  Filled 2022-05-02: qty 1

## 2022-05-02 MED ORDER — ALBUTEROL SULFATE HFA 108 (90 BASE) MCG/ACT IN AERS: 2.0000 | INHALATION_SPRAY | Freq: Four times a day (QID) | RESPIRATORY_TRACT | Status: DC | PRN

## 2022-05-02 MED ORDER — HYDROCODONE-ACETAMINOPHEN 5-325 MG PO TABS: 1.0000 | ORAL_TABLET | ORAL | Status: DC | PRN

## 2022-05-02 MED ORDER — IPRATROPIUM-ALBUTEROL 0.5-2.5 (3) MG/3ML IN SOLN
3.0000 mL | Freq: Four times a day (QID) | RESPIRATORY_TRACT | Status: DC
  Administered 2022-05-03 – 2022-05-10 (×27): 3 mL via RESPIRATORY_TRACT
  Filled 2022-05-02 (×28): qty 3

## 2022-05-02 MED ORDER — LISINOPRIL 10 MG PO TABS: 10.0000 mg | ORAL_TABLET | Freq: Every day | ORAL | Status: DC

## 2022-05-02 MED ORDER — POLYETHYLENE GLYCOL 3350 17 G PO PACK: 17.0000 g | PACK | Freq: Every day | ORAL | Status: DC | PRN

## 2022-05-02 MED ORDER — IPRATROPIUM-ALBUTEROL 0.5-2.5 (3) MG/3ML IN SOLN
RESPIRATORY_TRACT | Status: AC
  Administered 2022-05-02: 3 mL via RESPIRATORY_TRACT
  Filled 2022-05-02: qty 3

## 2022-05-02 MED ORDER — DEXTROMETHORPHAN POLISTIREX ER 30 MG/5ML PO SUER
5.0000 mL | Freq: Two times a day (BID) | ORAL | Status: DC
  Administered 2022-05-04: 30 mg via ORAL
  Filled 2022-05-02 (×11): qty 5

## 2022-05-02 MED ORDER — PHENOBARBITAL 32.4 MG PO TABS
64.8000 mg | ORAL_TABLET | ORAL | Status: DC
  Administered 2022-05-04: 64.8 mg via ORAL
  Filled 2022-05-02: qty 2

## 2022-05-02 MED ORDER — ACETAMINOPHEN 325 MG PO TABS: 650.0000 mg | ORAL_TABLET | Freq: Four times a day (QID) | ORAL | Status: DC | PRN

## 2022-05-02 MED ORDER — FINASTERIDE 5 MG PO TABS
5.0000 mg | ORAL_TABLET | Freq: Every day | ORAL | Status: DC
  Administered 2022-05-04 – 2022-05-17 (×10): 5 mg via ORAL
  Filled 2022-05-02 (×11): qty 1

## 2022-05-02 MED ORDER — SODIUM CHLORIDE 0.9 % IV SOLN: 250.0000 mL | INTRAVENOUS | Status: DC | PRN

## 2022-05-02 MED ORDER — METOPROLOL TARTRATE 5 MG/5ML IV SOLN: 5.0000 mg | Freq: Four times a day (QID) | INTRAVENOUS | Status: DC | PRN

## 2022-05-02 MED ORDER — POTASSIUM CHLORIDE CRYS ER 20 MEQ PO TBCR
40.0000 meq | EXTENDED_RELEASE_TABLET | Freq: Two times a day (BID) | ORAL | Status: DC
  Administered 2022-05-03: 40 meq via ORAL
  Filled 2022-05-02: qty 2

## 2022-05-02 MED ORDER — PRAVASTATIN SODIUM 20 MG PO TABS
10.0000 mg | ORAL_TABLET | Freq: Every day | ORAL | Status: DC
  Administered 2022-05-03: 10 mg via ORAL
  Filled 2022-05-02 (×2): qty 1

## 2022-05-02 MED ORDER — CRANBERRY 250 MG PO CAPS: 250.0000 mg | ORAL_CAPSULE | Freq: Two times a day (BID) | ORAL | Status: DC

## 2022-05-02 MED ORDER — INSULIN ASPART 100 UNIT/ML IJ SOLN
0.0000 [IU] | Freq: Three times a day (TID) | INTRAMUSCULAR | Status: DC
  Administered 2022-05-03 – 2022-05-04 (×4): 2 [IU] via SUBCUTANEOUS
  Administered 2022-05-04: 3 [IU] via SUBCUTANEOUS
  Administered 2022-05-04 – 2022-05-12 (×6): 2 [IU] via SUBCUTANEOUS
  Administered 2022-05-13: 8 [IU] via SUBCUTANEOUS
  Administered 2022-05-15 – 2022-05-16 (×3): 2 [IU] via SUBCUTANEOUS
  Filled 2022-05-02 (×17): qty 1

## 2022-05-02 MED ORDER — FOLIC ACID 1 MG PO TABS
1.0000 mg | ORAL_TABLET | Freq: Every morning | ORAL | Status: DC
  Administered 2022-05-04: 1 mg via ORAL
  Filled 2022-05-02: qty 1

## 2022-05-02 MED ORDER — ARIPIPRAZOLE 10 MG PO TABS
20.0000 mg | ORAL_TABLET | Freq: Every day | ORAL | Status: DC
  Administered 2022-05-04: 20 mg via ORAL
  Filled 2022-05-02 (×5): qty 2

## 2022-05-02 MED ORDER — FERROUS SULFATE 325 (65 FE) MG PO TABS
325.0000 mg | ORAL_TABLET | Freq: Every day | ORAL | Status: DC
  Administered 2022-05-04: 325 mg via ORAL
  Filled 2022-05-02: qty 1

## 2022-05-02 MED ORDER — METHYLPREDNISOLONE SODIUM SUCC 125 MG IJ SOLR
125.0000 mg | Freq: Once | INTRAMUSCULAR | Status: AC
  Administered 2022-05-02: 125 mg via INTRAVENOUS
  Filled 2022-05-02: qty 2

## 2022-05-02 MED ORDER — IPRATROPIUM-ALBUTEROL 0.5-2.5 (3) MG/3ML IN SOLN
3.0000 mL | Freq: Once | RESPIRATORY_TRACT | Status: AC
  Filled 2022-05-02: qty 3

## 2022-05-02 MED ORDER — BUPROPION HCL ER (XL) 150 MG PO TB24
300.0000 mg | ORAL_TABLET | Freq: Every day | ORAL | Status: DC
  Administered 2022-05-04: 300 mg via ORAL
  Filled 2022-05-02: qty 1
  Filled 2022-05-02: qty 2

## 2022-05-02 MED ORDER — UMECLIDINIUM BROMIDE 62.5 MCG/ACT IN AEPB
1.0000 | INHALATION_SPRAY | Freq: Every day | RESPIRATORY_TRACT | Status: DC
  Administered 2022-05-03 – 2022-05-17 (×12): 1 via RESPIRATORY_TRACT
  Filled 2022-05-02 (×2): qty 7

## 2022-05-02 MED ORDER — SUVOREXANT 5 MG PO TABS: 5.0000 mg | ORAL_TABLET | Freq: Every day | ORAL | Status: DC

## 2022-05-02 MED ORDER — FLUTICASONE FUROATE-VILANTEROL 100-25 MCG/ACT IN AEPB
1.0000 | INHALATION_SPRAY | Freq: Every day | RESPIRATORY_TRACT | Status: DC
  Administered 2022-05-04 – 2022-05-17 (×13): 1 via RESPIRATORY_TRACT
  Filled 2022-05-02 (×2): qty 28

## 2022-05-02 MED ORDER — VITAMIN D 25 MCG (1000 UNIT) PO TABS
1000.0000 [IU] | ORAL_TABLET | Freq: Every morning | ORAL | Status: DC
  Administered 2022-05-04: 1000 [IU] via ORAL
  Filled 2022-05-02: qty 1

## 2022-05-02 MED ORDER — ALBUTEROL SULFATE (2.5 MG/3ML) 0.083% IN NEBU: 2.5000 mg | INHALATION_SOLUTION | RESPIRATORY_TRACT | Status: DC | PRN

## 2022-05-02 MED ORDER — VITAMIN C 500 MG PO TABS
500.0000 mg | ORAL_TABLET | Freq: Two times a day (BID) | ORAL | Status: DC
  Administered 2022-05-03 – 2022-05-04 (×2): 500 mg via ORAL
  Filled 2022-05-02 (×3): qty 1

## 2022-05-02 MED ORDER — LORATADINE 10 MG PO TABS
10.0000 mg | ORAL_TABLET | Freq: Every day | ORAL | Status: DC
  Administered 2022-05-04: 10 mg via ORAL
  Filled 2022-05-02: qty 1

## 2022-05-02 MED ORDER — IOHEXOL 350 MG/ML SOLN
75.0000 mL | Freq: Once | INTRAVENOUS | Status: AC | PRN
  Administered 2022-05-02: 75 mL via INTRAVENOUS

## 2022-05-02 MED ORDER — SODIUM CHLORIDE 0.9% FLUSH
3.0000 mL | Freq: Two times a day (BID) | INTRAVENOUS | Status: DC
  Administered 2022-05-03 – 2022-05-17 (×26): 3 mL via INTRAVENOUS

## 2022-05-02 NOTE — Assessment & Plan Note (Signed)
Continue Wellbutrin Continue Abilify

## 2022-05-02 NOTE — Assessment & Plan Note (Addendum)
Currently no wheezing or signs of exacerbation. Add additional inhaled steroids and long-acting beta muscarinic agonists. O2 as needed

## 2022-05-02 NOTE — Assessment & Plan Note (Signed)
-  Continue Pravachol ?

## 2022-05-02 NOTE — Assessment & Plan Note (Signed)
Hemoglobin is stable at 9.7 Continue iron Continue folate

## 2022-05-02 NOTE — Assessment & Plan Note (Signed)
-   Continue phenobarbital 

## 2022-05-02 NOTE — Assessment & Plan Note (Addendum)
Patient has a history of this and bilateral lower extremity swelling.  CTA reveals small pulmonary embolism likely from this.  Will need long-term anticoagulation.

## 2022-05-02 NOTE — Assessment & Plan Note (Signed)
Continue Proscar   

## 2022-05-02 NOTE — ED Notes (Addendum)
Pt transitioned to 4L [place] from [place] with MD at the bedside- transported by this RN to CT. Pt tolerated well.

## 2022-05-02 NOTE — Assessment & Plan Note (Addendum)
Start heparin gtt. No evidence of right heart strain Likely primary cause for his respiratory distress and oxygen requirements. O2 as needed Supportive care Pharmacy consult for transition

## 2022-05-02 NOTE — ED Provider Notes (Signed)
[facility] Provider Note    Event Date/Time   First MD Initiated Contact with Patient 05/02/22 2053     (approximate)   History   Shortness of Breath  Level V Caveat:  ams respiratory failure  HPI  [name] is a 81 y.o. male with a history of CHF as well as COPD presents the ER for evaluation of shortness of breath hypoxia found to be 84% facility.  No reported fever patient does feel subjective shortness of breath.  Does have lower extremity swelling appears to be chronically bedbound.  Not on any anticoagulation.     Physical Exam   Triage Vital Signs: ED Triage Vitals  Enc Vitals Group     BP 05/02/22 2101 (!) 169/109     Pulse Rate 05/02/22 2100 91     Resp 05/02/22 2100 16     Temp 05/02/22 2101 97.8 F (36.6 C)     Temp Source 05/02/22 2101 Axillary     SpO2 05/02/22 2100 98 %     Weight 05/02/22 2102 226 lb (102.5 kg)     Height 05/02/22 2102 5\' 8"  (1.727 m)     Head Circumference --      [facility] Flow --      Pain Score 05/02/22 2102 0     Pain Loc --      Pain Edu? --      Excl. in [place]? --     Most recent vital signs: Vitals:   05/02/22 2101 05/02/22 2300  BP: (!) 169/109 128/74  Pulse: 94 79  Resp: (!) 21 (!) 22  Temp: 97.8 F (36.6 C)   SpO2: 100% 98%     Constitutional: Alert chronically ill-appearing Eyes: Conjunctivae are normal.  Head: Atraumatic. Nose: No congestion/rhinnorhea. Mouth/Throat: Mucous membranes are moist.   Neck: Painless ROM.  Cardiovascular:   Good peripheral circulation.  No murmurs gallops or rubs Respiratory: Tachypnea with diminished breath sounds bilaterally.  No rhonchi. Gastrointestinal: Soft and nontender.  Musculoskeletal: Bilateral lower extremity pitting edema. Skin:  Skin is warm, dry and intact. No rash noted. Psychiatric: calm and cooperative    ED Results / Procedures / Treatments   Labs (all labs ordered are listed, but only abnormal results are displayed) Labs  Reviewed  COMPREHENSIVE METABOLIC PANEL - Abnormal; Notable for the following components:      Result Value   Potassium 2.9 (*)    Glucose, Bld 143 (*)    Calcium 8.7 (*)    Albumin 3.3 (*)    All other components within normal limits  BRAIN NATRIURETIC PEPTIDE - Abnormal; Notable for the following components:   B Natriuretic Peptide 318.6 (*)    All other components within normal limits  BLOOD GAS, VENOUS - Abnormal; Notable for the following components:   pCO2, Ven 40 (*)    All other components within normal limits  SARS CORONAVIRUS 2 BY RT PCR  LACTIC ACID, PLASMA  CBC WITH DIFFERENTIAL/PLATELET  PROTIME-INR  APTT  URINALYSIS, COMPLETE (UACMP) WITH MICROSCOPIC  LACTIC ACID, PLASMA  URINALYSIS, ROUTINE W REFLEX MICROSCOPIC     EKG  ED ECG REPORT I, Merlyn Lot, the attending physician, personally viewed and interpreted this ECG.   Date: 05/02/2022  EKG Time: 20:58  Rate: 90  Rhythm: sinus  Axis: normal  Intervals:rbbb  ST&T Change: no stemi, nonspecific t wave abn    RADIOLOGY Please see ED Course for my review and interpretation.  I personally reviewed all radiographic  images ordered to evaluate for the above acute complaints and reviewed radiology reports and findings.  These findings were personally discussed with the patient.  Please see medical record for radiology report.    PROCEDURES:  Critical Care performed: Yes, see critical care procedure note(s)  .Critical Care  Performed by: Willy Eddy, MD Authorized by: Willy Eddy, MD   Critical care provider statement:    Critical care time (minutes):  40   Critical care was necessary to treat or prevent imminent or life-threatening deterioration of the following conditions:  Respiratory failure   Critical care was time spent personally by me on the following activities:  Ordering and performing treatments and interventions, ordering and review of laboratory studies, ordering and review  of radiographic studies, pulse oximetry, re-evaluation of patient's condition, review of old charts, obtaining history from patient or surrogate, examination of patient, evaluation of patient's response to treatment, discussions with primary provider, discussions with consultants and development of treatment plan with patient or surrogate    MEDICATIONS ORDERED IN ED: Medications  iohexol (OMNIPAQUE) 350 MG/ML injection 75 mL (has no administration in time range)  ipratropium-albuterol (DUONEB) 0.5-2.5 (3) MG/3ML nebulizer solution 3 mL (3 mLs Nebulization Given 05/02/22 2252)  ipratropium-albuterol (DUONEB) 0.5-2.5 (3) MG/3ML nebulizer solution 3 mL (3 mLs Nebulization Given 05/02/22 2319)  methylPREDNISolone sodium succinate (SOLU-MEDROL) 125 mg/2 mL injection 125 mg (125 mg Intravenous Given 05/02/22 2314)     IMPRESSION / MDM / ASSESSMENT AND PLAN / ED COURSE  I reviewed the triage vital signs and the nursing notes.                              Differential diagnosis includes, but is not limited to, Asthma, copd, CHF, pna, ptx, malignancy, Pe, anemia  Patient presented to the ER for evaluation of symptoms as described above.  This presenting complaint could reflect a potentially life-threatening illness therefore the patient will be placed on continuous pulse oximetry and telemetry for monitoring.  Laboratory evaluation will be sent to evaluate for the above complaints.  Patient in moderate respiratory distress appears to be improving on CPAP.  Differential broad given his underlying chronic medical conditions.  Will order x-ray to further determine CHF versus COPD.  Will give nebulizer.  Patient transitioned onto BiPAP.    Clinical Course as of 05/02/22 2320  Sun May 02, 2022  2316 Reassessed.  Appears to be stabilizing.  I think this point will trial off BiPAP.  Blood work is fairly reassuring no significant hypercapnic respiratory failure.  Have ordered CTA given his swelling  chronically immobile status not on anticoagulation evaluate for PE.  I have consulted hospitalist for admission. [PR]    Clinical Course User Index [PR] Willy Eddy, MD     FINAL CLINICAL IMPRESSION(S) / ED DIAGNOSES   Final diagnoses:  Acute respiratory failure with hypoxia (HCC)     Rx / DC Orders   ED Discharge Orders     None        Note:  This document was prepared using Dragon voice recognition software and may include unintentional dictation errors.    Willy Eddy, MD 05/02/22 2320

## 2022-05-02 NOTE — ED Triage Notes (Signed)
Pt brought in via [place] w c/o SOB today, 89 RA per medics. Pt placed on cpap enroute to ED. Pt a&ox3 on arrival.

## 2022-05-02 NOTE — Assessment & Plan Note (Signed)
Replete and trend 

## 2022-05-02 NOTE — Assessment & Plan Note (Addendum)
--  Continue carvedilol --Hold ACE inhibitor (consider ARB as it appears pt on chronic cough medication) --Lasix as needed - will give 20 mg IV once today due to developing anasarca. --Minimize IV fluids

## 2022-05-02 NOTE — Assessment & Plan Note (Signed)
Chronic Lasix as needed

## 2022-05-02 NOTE — Assessment & Plan Note (Addendum)
Check urinalysis Check urine culture Patient has indwelling suprapubic catheter and urine had a lot of sediment in it. Has been afebrile, no leukocytosis, no s/sx's of UTI. Monitor.

## 2022-05-02 NOTE — Assessment & Plan Note (Signed)
Continue lisinopril Continue carvedilol

## 2022-05-02 NOTE — H&P (Addendum)
History and Physical    Patient: [name] PPJ:[id number] DOB: 11/06/1940 DOA: 05/02/2022 DOS: the patient was seen and examined on 05/02/2022 PCP: Rosetta Posner, MD  Patient coming from: SNF  Chief Complaint:  Chief Complaint  Patient presents with   Shortness of Breath   HPI: [name] is a 81 y.o. male with medical history significant of HTN, HLD, GERD, COPD, CHF (diastolic), seizure disorder, BPH who comes in today with presumed COPD exacerbation.  Per the EDP he was wheezing on admission and hypoxic.  EMS had to put him on CPAP to get his oxygen saturations up.  His blood gases were essentially normal.  He was weaned down to nasal cannula.  His chest x-ray was negative.  COVID was also negative.  We are asked to admit to stepdown given his new respiratory distress. Review of Systems: As mentioned in the history of present illness. All other systems reviewed and are negative. Past Medical History:  Diagnosis Date   Bipolar 1 disorder (HCC)    C. difficile diarrhea 05/2016   history of ...   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   Schizophrenia (HCC)    Seizure (HCC)    on phenobarb   Urinary retention    frequently requiring foley placement   VRE (vancomycin resistant enterococcus) culture positive    urine   Past Surgical History:  Procedure Laterality Date   EXTRACORPOREAL SHOCK WAVE LITHOTRIPSY Right 05/05/2017   Procedure: EXTRACORPOREAL SHOCK WAVE LITHOTRIPSY (ESWL);  Surgeon: Vanna Scotland, MD;  Location: [facility] ORS;  Service: Urology;  Laterality: Right;   IR CATHETER TUBE CHANGE  06/23/2017   IR CATHETER TUBE  CHANGE  06/28/2017   Social History:  reports that he has never smoked. He has never been exposed to tobacco smoke. He has never used smokeless tobacco. He reports that he does not drink alcohol and does not use drugs.  Allergies  Allergen Reactions   Penicillin G Rash and Other (See Comments)    Convulsions.    Has patient had a PCN reaction causing immediate rash, facial/tongue/throat swelling, SOB or lightheadedness with hypotension: not sure Has patient had a PCN reaction causing severe rash involving mucus membranes or skin necrosis: not sure Has patient had a PCN reaction that required hospitalization: not sure Has patient had a PCN reaction occurring within the last 10 years: not sure If all of the above answers are "NO", then may proceed with Cephalosporin use.    Family History  Problem Relation Age of Onset   Cancer Mother    Prostate cancer Neg Hx    Kidney disease Neg Hx    Kidney cancer Neg Hx    Bladder Cancer Neg Hx     Prior to Admission medications   Medication Sig Start Date End Date Taking? Authorizing Provider  ammonium lactate (AMLACTIN) 12 % cream Apply 1 Application topically every 12 (twelve) hours.   Yes [provider]  ARIPiprazole (ABILIFY) 20 MG tablet Take 20 mg by mouth in the morning.   Yes [provider]  ascorbic acid (VITAMIN C)  500 MG tablet Take 1 tablet (500 mg total) by mouth 2 (two) times daily. 01/16/22  Yes Nolberto Hanlon, MD  buPROPion (BUDEPRION XL) 300 MG 24 hr tablet Take 1 tablet (300 mg total) by mouth daily. 04/26/16  Yes Wieting, Richard, MD  carvedilol (COREG) 25 MG tablet Take 25 mg by mouth 2 (two) times daily. 04/27/22  Yes [provider]  cetirizine (ZYRTEC) 10 MG tablet Take 10 mg by mouth in the morning.   Yes [provider]  cholecalciferol (VITAMIN D3) 25 MCG (1000 UNIT) tablet Take 1,000 Units by mouth in the morning.   Yes [provider]  Cranberry 250 MG CAPS Take 250 mg by mouth  2 (two) times daily.   Yes [provider]  Dextromethorphan HBr 15 MG CAPS Take 2 capsules by mouth every 12 (twelve) hours.   Yes [provider]  ferrous sulfate 325 (65 FE) MG tablet Take 325 mg by mouth daily with breakfast.   Yes [provider]  finasteride (PROSCAR) 5 MG tablet Take 1 tablet (5 mg total) by mouth daily. 04/27/16  Yes Wieting, Richard, MD  folic acid (FOLVITE) 1 MG tablet Take 1 mg by mouth in the morning.   Yes [provider]  furosemide (LASIX) 20 MG tablet Take 20 mg by mouth 2 (two) times daily. 04/27/22  Yes [provider]  lisinopril (ZESTRIL) 10 MG tablet Take 10 mg by mouth daily. 04/17/22  Yes [provider]  Multiple Vitamin (MULTI-VITAMINS) TABS Take 1 tablet by mouth daily.   Yes [provider]  PHENobarbital (LUMINAL) 64.8 MG tablet Take 64.8 mg by mouth every other day.   Yes [provider]  potassium chloride SA (KLOR-CON M) 20 MEQ tablet Take 20 mEq by mouth daily. 04/05/22  Yes [provider]  pravastatin (PRAVACHOL) 10 MG tablet Take 10 mg by mouth at bedtime. 04/28/22  Yes [provider]  Suvorexant 5 MG TABS Take 5 mg by mouth at bedtime.   Yes [provider]  zinc oxide 20 % ointment Apply 1 application. topically 2 (two) times daily. (Apply to legs)   Yes [provider]  albuterol (VENTOLIN HFA) 108 (90 Base) MCG/ACT inhaler Inhale 2 puffs into the lungs every 6 (six) hours as needed for wheezing or shortness of breath.    [provider]  tamsulosin (FLOMAX) 0.4 MG CAPS capsule Take 0.4 mg by mouth daily. Patient not taking: Reported on 05/02/2022 04/03/22   [provider]    Physical Exam: Vitals:   05/02/22 2100 05/02/22 2101 05/02/22 2102 05/02/22 2300  BP:  (!) 169/109  128/74  Pulse: 91 94  79  Resp: 16 (!) 21  (!) 22  Temp:  97.8 F (36.6 C)    TempSrc:  Axillary    SpO2: 98% 100%  98%  Weight:   102.5 kg    Height:   5\' 8"  (1.727 m)    Physical Examination: General appearance - ill-appearing and chronically ill appearing Neck - supple, no significant adenopathy Chest - clear to auscultation, no wheezes, rales or rhonchi, symmetric air entry Heart - normal rate, regular rhythm, normal S1, S2, no murmurs, rubs, clicks or gallops Abdomen - soft, nontender, nondistended, no masses or organomegaly Extremities - peripheral pulses normal, no pedal edema, no clubbing or cyanosis Skin - Chronic venous insufficiency of the lower extremities bilaterally  Data Reviewed: Results for orders placed or performed during the hospital encounter of 05/02/22 (from the past  24 hour(s))  Lactic acid, plasma     Status: None   Collection Time: 05/02/22  9:21 PM  Result Value Ref Range   Lactic Acid, Venous 1.5 0.5 - 1.9 mmol/L  Comprehensive metabolic panel     Status: Abnormal   Collection Time: 05/02/22  9:21 PM  Result Value Ref Range   Sodium 144 135 - 145 mmol/L   Potassium 2.9 (L) 3.5 - 5.1 mmol/L   Chloride 107 98 - 111 mmol/L   CO2 27 22 - 32 mmol/L   Glucose, Bld 143 (H) 70 - 99 mg/dL   BUN 14 8 - 23 mg/dL   Creatinine, Ser 3.38 0.61 - 1.24 mg/dL   Calcium 8.7 (L) 8.9 - 10.3 mg/dL   Total Protein 8.1 6.5 - 8.1 g/dL   Albumin 3.3 (L) 3.5 - 5.0 g/dL   AST 16 15 - 41 U/L   ALT 10 0 - 44 U/L   Alkaline Phosphatase 100 38 - 126 U/L   Total Bilirubin 0.9 0.3 - 1.2 mg/dL   GFR, Estimated >25 >05 mL/min   Anion gap 10 5 - 15  CBC with Differential     Status: None   Collection Time: 05/02/22  9:21 PM  Result Value Ref Range   WBC 8.1 4.0 - 10.5 K/uL   RBC 4.97 4.22 - 5.81 MIL/uL   Hemoglobin 13.7 13.0 - 17.0 g/dL   HCT 39.7 67.3 - 41.9 %   MCV 86.7 80.0 - 100.0 fL   MCH 27.6 26.0 - 34.0 pg   MCHC 31.8 30.0 - 36.0 g/dL   RDW 37.9 02.4 - 09.7 %   Platelets 273 150 - 400 K/uL   nRBC 0.0 0.0 - 0.2 %   Neutrophils Relative % 78 %   Neutro Abs 6.3 1.7 - 7.7 K/uL   Lymphocytes Relative 18 %    Lymphs Abs 1.4 0.7 - 4.0 K/uL   Monocytes Relative 4 %   Monocytes Absolute 0.3 0.1 - 1.0 K/uL   Eosinophils Relative 0 %   Eosinophils Absolute 0.0 0.0 - 0.5 K/uL   Basophils Relative 0 %   Basophils Absolute 0.0 0.0 - 0.1 K/uL   Immature Granulocytes 0 %   Abs Immature Granulocytes 0.02 0.00 - 0.07 K/uL  Protime-INR     Status: None   Collection Time: 05/02/22  9:21 PM  Result Value Ref Range   Prothrombin Time 14.6 11.4 - 15.2 seconds   INR 1.2 0.8 - 1.2  APTT     Status: None   Collection Time: 05/02/22  9:21 PM  Result Value Ref Range   aPTT 30 24 - 36 seconds  Brain natriuretic peptide     Status: Abnormal   Collection Time: 05/02/22  9:21 PM  Result Value Ref Range   B Natriuretic Peptide 318.6 (H) 0.0 - 100.0 pg/mL  SARS Coronavirus 2 by RT PCR (hospital order, performed in [facility] hospital lab) *cepheid single result test* Anterior Nasal Swab     Status: None   Collection Time: 05/02/22  9:21 PM   Specimen: Anterior Nasal Swab  Result Value Ref Range   SARS Coronavirus 2 by RT PCR NEGATIVE NEGATIVE  Blood gas, venous     Status: Abnormal   Collection Time: 05/02/22  9:21 PM  Result Value Ref Range   FIO2 40 %   Delivery systems BILEVEL POSITIVE AIRWAY PRESSURE    pH, Ven 7.38 7.25 - 7.43   pCO2, Ven 40 (L) 44 -  60 mmHg   pO2, Ven 44 32 - 45 mmHg   Bicarbonate 23.7 20.0 - 28.0 mmol/L   Acid-base deficit 1.3 0.0 - 2.0 mmol/L   O2 Saturation 78.1 %   Patient temperature 37.0    Collection site VENOUS    CT Angio Chest PE W and/or Wo Contrast  Result Date: 05/02/2022 CLINICAL DATA:  Pulmonary embolism (PE) suspected, high prob. Shortness of breath EXAM: CT ANGIOGRAPHY CHEST WITH CONTRAST TECHNIQUE: Multidetector CT imaging of the chest was performed using the standard protocol during bolus administration of intravenous contrast. Multiplanar CT image reconstructions and MIPs were obtained to evaluate the vascular anatomy. RADIATION DOSE REDUCTION: This exam was  performed according to the departmental dose-optimization program which includes automated exposure control, adjustment of the mA and/or kV according to patient size and/or use of iterative reconstruction technique. CONTRAST:  75mL OMNIPAQUE IOHEXOL 350 MG/ML SOLN COMPARISON:  10/02/2012 FINDINGS: Cardiovascular: Filling defects within right lower lobe pulmonary arterial branches compatible with pulmonary emboli. No pulmonary emboli on the left. No evidence of right heart strain. Heart is normal size. Aorta normal caliber. Coronary artery and aortic calcifications. Mediastinum/Nodes: No mediastinal, hilar, or axillary adenopathy. Trachea and esophagus are unremarkable. Thyroid unremarkable. Lungs/Pleura: Elevation of the right hemidiaphragm. Right basilar opacity could reflect atelectasis or early infarct. Left pendant atelectasis. No effusions. Upper Abdomen: Gaseous distention of bowel in the upper abdomen. Musculoskeletal: Chest wall soft tissues are unremarkable. No acute bony abnormality. Review of the MIP images confirms the above findings. IMPRESSION: Filling defects within right lower lobe pulmonary arterial branches compatible with pulmonary emboli. Associated right basilar airspace opacity could reflect atelectasis or infarcts. Elevation of the right hemidiaphragm. Coronary artery disease. Aortic Atherosclerosis (ICD10-I70.0). These results were called by telephone at the time of interpretation on 05/02/2022 at 11:47 pm to provider Willy EddyPATRICK ROBINSON , who verbally acknowledged these results. Electronically Signed   By: Charlett NoseKevin  Dover M.D.   On: 05/02/2022 23:48   DG Chest Port 1 View  Result Date: 05/02/2022 CLINICAL DATA:  Questionable sepsis - evaluate for abnormality. Shortness of breath EXAM: PORTABLE CHEST 1 VIEW COMPARISON:  01/10/2022 FINDINGS: Heart and mediastinal contours are within normal limits. No focal opacities or effusions. No acute bony abnormality. IMPRESSION: No active disease.  Electronically Signed   By: Charlett NoseKevin  Dover M.D.   On: 05/02/2022 22:06     Assessment and Plan: * Acute pulmonary embolism (HCC) Start heparin gtt. Likely primary cause for his respiratory distress and oxygen requirements. O2 as needed Supportive care Pharmacy consult for transition  UTI (urinary tract infection) Check urinalysis Check urine culture Patient has indwelling suprapubic catheter and urine had a lot of sediment in it.  Chronic diastolic CHF (congestive heart failure) (HCC) Continue ACE inhibitor On carvedilol Lasix as needed BNP is slightly elevated but chest x-ray does not show fluid overload We will minimize IV fluids  Seizure disorder (HCC) Continue phenobarbital  Depression Continue Wellbutrin Continue Abilify  Hypertension Continue lisinopril Continue carvedilol  Mixed hyperlipidemia Continue Pravachol  BPH (benign prostatic hyperplasia) Continue Proscar  Normocytic anemia Hemoglobin is stable at 9.7 Continue iron Continue folate  Hypokalemia Replete and trend  COPD exacerbation (HCC) Currently no wheezing. We will hold on p.o. steroids Add additional inhaled steroids and long-acting beta muscarinic agonists. O2 as needed  Venous insufficiency of both lower extremities Chronic Lasix as needed  DVT of lower extremity, bilateral (HCC) Patient has a history of this and bilateral lower extremity swelling.  CTA reveals small pulmonary embolism  likely from this.  Will need long-term anticoagulation.       Advance Care Planning:   Code Status: Prior full  Consults: None  Family Communication: None I did question the patient he said he had no family nearby.  Severity of Illness: The appropriate patient status for this patient is INPATIENT. Inpatient status is judged to be reasonable and necessary in order to provide the required intensity of service to ensure the patient's safety. The patient's presenting symptoms, physical exam findings,  and initial radiographic and laboratory data in the context of their chronic comorbidities is felt to place them at high risk for further clinical deterioration. Furthermore, it is not anticipated that the patient will be medically stable for discharge from the hospital within 2 midnights of admission.   * I certify that at the point of admission it is my clinical judgment that the patient will require inpatient hospital care spanning beyond 2 midnights from the point of admission due to high intensity of service, high risk for further deterioration and high frequency of surveillance required.  Patient has ongoing oxygen requirement as well as the risk of leaving without full anticoagulation.*  Author: Reva Bores, MD 05/02/2022 11:44 PM  For on call review [email or web address].

## 2022-05-03 ENCOUNTER — Other Ambulatory Visit: Payer: Self-pay

## 2022-05-03 ENCOUNTER — Inpatient Hospital Stay: Payer: Medicare Other

## 2022-05-03 ENCOUNTER — Encounter: Payer: Medicare Other | Admitting: Physician Assistant

## 2022-05-03 DIAGNOSIS — I2699 Other pulmonary embolism without acute cor pulmonale: Secondary | ICD-10-CM | POA: Diagnosis not present

## 2022-05-03 DIAGNOSIS — E440 Moderate protein-calorie malnutrition: Secondary | ICD-10-CM | POA: Diagnosis present

## 2022-05-03 LAB — CBC
HCT: 37.7 % — ABNORMAL LOW (ref 39.0–52.0)
Hemoglobin: 12.1 g/dL — ABNORMAL LOW (ref 13.0–17.0)
MCH: 27.7 pg (ref 26.0–34.0)
MCHC: 32.1 g/dL (ref 30.0–36.0)
MCV: 86.3 fL (ref 80.0–100.0)
Platelets: 231 10*3/uL (ref 150–400)
RBC: 4.37 MIL/uL (ref 4.22–5.81)
RDW: 14.8 % (ref 11.5–15.5)
WBC: 6.4 10*3/uL (ref 4.0–10.5)
nRBC: 0 % (ref 0.0–0.2)

## 2022-05-03 LAB — URINALYSIS, ROUTINE W REFLEX MICROSCOPIC
Bilirubin Urine: NEGATIVE
Glucose, UA: 100 mg/dL — AB
Nitrite: NEGATIVE
Protein, ur: >300 mg/dL — AB
Specific Gravity, Urine: 1.02 (ref 1.005–1.030)
pH: 8.5 — ABNORMAL HIGH (ref 5.0–8.0)

## 2022-05-03 LAB — GLUCOSE, CAPILLARY
Glucose-Capillary: 144 mg/dL — ABNORMAL HIGH (ref 70–99)
Glucose-Capillary: 126 mg/dL — ABNORMAL HIGH (ref 70–99)
Glucose-Capillary: 134 mg/dL — ABNORMAL HIGH (ref 70–99)
Glucose-Capillary: 163 mg/dL — ABNORMAL HIGH (ref 70–99)

## 2022-05-03 LAB — BASIC METABOLIC PANEL
Anion gap: 9 (ref 5–15)
BUN: 18 mg/dL (ref 8–23)
CO2: 24 mmol/L (ref 22–32)
Calcium: 8.5 mg/dL — ABNORMAL LOW (ref 8.9–10.3)
Chloride: 110 mmol/L (ref 98–111)
Creatinine, Ser: 1.29 mg/dL — ABNORMAL HIGH (ref 0.61–1.24)
GFR, Estimated: 56 mL/min — ABNORMAL LOW (ref >60)
Glucose, Bld: 148 mg/dL — ABNORMAL HIGH (ref 70–99)
Potassium: 2.6 mmol/L — CL (ref 3.5–5.1)
Sodium: 143 mmol/L (ref 135–145)

## 2022-05-03 LAB — RESPIRATORY PANEL BY PCR

## 2022-05-03 LAB — HEPARIN LEVEL (UNFRACTIONATED)
Heparin Unfractionated: 0.59 IU/mL (ref 0.30–0.70)
Heparin Unfractionated: 0.69 IU/mL (ref 0.30–0.70)

## 2022-05-03 LAB — HEMOGLOBIN A1C
Hgb A1c MFr Bld: 5.1 % (ref 4.8–5.6)
Mean Plasma Glucose: 99.67 mg/dL

## 2022-05-03 LAB — TSH: TSH: 3.241 u[IU]/mL (ref 0.350–4.500)

## 2022-05-03 LAB — CBG MONITORING, ED: Glucose-Capillary: 133 mg/dL — ABNORMAL HIGH (ref 70–99)

## 2022-05-03 LAB — URINALYSIS, MICROSCOPIC (REFLEX)
Bacteria, UA: NONE SEEN
RBC / HPF: NONE SEEN RBC/hpf (ref 0–5)
Squamous Epithelial / HPF: NONE SEEN (ref 0–5)

## 2022-05-03 LAB — MRSA NEXT GEN BY PCR, NASAL: MRSA by PCR Next Gen: NOT DETECTED

## 2022-05-03 LAB — LACTIC ACID, PLASMA: Lactic Acid, Venous: 0.4 mmol/L — ABNORMAL LOW (ref 0.5–1.9)

## 2022-05-03 LAB — MAGNESIUM: Magnesium: 2.2 mg/dL (ref 1.7–2.4)

## 2022-05-03 MED ORDER — ORAL CARE MOUTH RINSE: 15.0000 mL | OROMUCOSAL | Status: DC | PRN

## 2022-05-03 MED ORDER — HEPARIN BOLUS VIA INFUSION
6000.0000 [IU] | Freq: Once | INTRAVENOUS | Status: AC
  Administered 2022-05-03: 6000 [IU] via INTRAVENOUS
  Filled 2022-05-03: qty 6000

## 2022-05-03 MED ORDER — CHLORHEXIDINE GLUCONATE CLOTH 2 % EX PADS
6.0000 | MEDICATED_PAD | Freq: Every day | CUTANEOUS | Status: DC
  Administered 2022-05-03 – 2022-05-17 (×13): 6 via TOPICAL

## 2022-05-03 MED ORDER — POTASSIUM CHLORIDE CRYS ER 20 MEQ PO TBCR
40.0000 meq | EXTENDED_RELEASE_TABLET | Freq: Two times a day (BID) | ORAL | Status: DC
  Administered 2022-05-03 – 2022-05-04 (×3): 40 meq via ORAL
  Filled 2022-05-03 (×3): qty 2

## 2022-05-03 MED ORDER — HEPARIN (PORCINE) 25000 UT/250ML-% IV SOLN
1500.0000 [IU]/h | INTRAVENOUS | Status: DC
  Administered 2022-05-03 (×2): 1500 [IU]/h via INTRAVENOUS
  Filled 2022-05-03 (×2): qty 250

## 2022-05-03 NOTE — Progress Notes (Signed)
       CROSS COVER NOTE  NAME: [name] MRN: [id number] DOB : 1941-04-20    Date of Service   05/03/2022   HPI/Events of Note   Notified by nursing that Mr Medearis had a weak and wet cough after taking PO potassium this morning.   Interventions   Assessment/Plan:  NPO pending speech evaluation      This document was prepared using Dragon voice recognition software and may include unintentional dictation errors.  Neomia Glass DNP, MHA, FNP-BC Nurse Practitioner [facility] Pager [phone]

## 2022-05-03 NOTE — Progress Notes (Signed)
[facility] [place] NAME: [name]    MR#:  [id number]  DATE OF BIRTH:  09-25-1940  SUBJECTIVE:  poor historian at baseline. Patient was sent in from his nursing home facility with increasing shortness of breath. Workup showed back he is pulmonary embolism. Patient has been having some issues with swallowing and speech therapy working with him. No family at bedside. No family listed in the contact information. Appears patient is bedbound suprapubic catheter.    VITALS:  Blood pressure 136/86, pulse 83, temperature 98.2 F (36.8 C), temperature source Axillary, resp. rate (!) 24, height 5\' 8"  (1.727 m), weight 95 kg, SpO2 95 %.  PHYSICAL EXAMINATION:   GENERAL:  81 y.o.-year-old patient lying in the bed with no acute distress. Obese. Chronically ill appearing. LUNGS: Normal breath sounds bilaterally CARDIOVASCULAR: S1, S2 normal. No murmurs, rubs, or gallops.  ABDOMEN: Soft, nontender,distended. Rapidly catheter present EXTREMITIES: chronic bilateral lower extremity edema with significant dry skin and poor hygiene NEUROLOGIC: nonfocal  patient is alert and awake SKIN: per RN decubitus stage II on buttock  LABORATORY PANEL:  CBC Recent Labs  Lab 05/03/22 0406  WBC 6.4  HGB 12.1*  HCT 37.7*  PLT 231    Chemistries  Recent Labs  Lab 05/02/22 2120 05/02/22 2121 05/02/22 2121 05/03/22 0406  NA  --  144   < > 143  K  --  2.9*   < > 2.6*  CL  --  107   < > 110  CO2  --  27   < > 24  GLUCOSE  --  143*   < > 148*  BUN  --  14   < > 18  CREATININE  --  1.19   < > 1.29*  CALCIUM  --  8.7*   < > 8.5*  MG 2.2  --   --   --   AST  --  16  --   --   ALT  --  10  --   --   ALKPHOS  --  100  --   --   BILITOT  --  0.9  --   --    < > = values in this interval not displayed.   Cardiac Enzymes No results for input(s): "TROPONINI" in the last 168 hours. RADIOLOGY:  CT Angio Chest PE W and/or Wo Contrast  Result Date:  05/02/2022 CLINICAL DATA:  Pulmonary embolism (PE) suspected, high prob. Shortness of breath EXAM: CT ANGIOGRAPHY CHEST WITH CONTRAST TECHNIQUE: Multidetector CT imaging of the chest was performed using the standard protocol during bolus administration of intravenous contrast. Multiplanar CT image reconstructions and MIPs were obtained to evaluate the vascular anatomy. RADIATION DOSE REDUCTION: This exam was performed according to the departmental dose-optimization program which includes automated exposure control, adjustment of the mA and/or kV according to patient size and/or use of iterative reconstruction technique. CONTRAST:  51mL OMNIPAQUE IOHEXOL 350 MG/ML SOLN COMPARISON:  10/02/2012 FINDINGS: Cardiovascular: Filling defects within right lower lobe pulmonary arterial branches compatible with pulmonary emboli. No pulmonary emboli on the left. No evidence of right heart strain. Heart is normal size. Aorta normal caliber. Coronary artery and aortic calcifications. Mediastinum/Nodes: No mediastinal, hilar, or axillary adenopathy. Trachea and esophagus are unremarkable. Thyroid unremarkable. Lungs/Pleura: Elevation of the right hemidiaphragm. Right basilar opacity could reflect atelectasis or early infarct. Left pendant atelectasis. No effusions. Upper Abdomen: Gaseous distention of bowel in the upper abdomen. Musculoskeletal: Chest wall  soft tissues are unremarkable. No acute bony abnormality. Review of the MIP images confirms the above findings. IMPRESSION: Filling defects within right lower lobe pulmonary arterial branches compatible with pulmonary emboli. Associated right basilar airspace opacity could reflect atelectasis or infarcts. Elevation of the right hemidiaphragm. Coronary artery disease. Aortic Atherosclerosis (ICD10-I70.0). These results were called by telephone at the time of interpretation on 05/02/2022 at 11:47 pm to provider Willy Eddy , who verbally acknowledged these results.  Electronically Signed   By: Charlett Nose M.D.   On: 05/02/2022 23:48   DG Chest Port 1 View  Result Date: 05/02/2022 CLINICAL DATA:  Questionable sepsis - evaluate for abnormality. Shortness of breath EXAM: PORTABLE CHEST 1 VIEW COMPARISON:  01/10/2022 FINDINGS: Heart and mediastinal contours are within normal limits. No focal opacities or effusions. No acute bony abnormality. IMPRESSION: No active disease. Electronically Signed   By: Charlett Nose M.D.   On: 05/02/2022 22:06    Assessment and Plan  [name] is a 81 y.o. male with medical history significant of HTN, HLD, GERD, COPD, CHF (diastolic), seizure disorder, BPH who comes in today with presumed COPD exacerbation.  Per the EDP he was wheezing on admission and hypoxic.  EMS had to put him on CPAP to get his oxygen saturations up.  His blood gases were essentially normal.  He was weaned down to nasal cannula.  His chest x-ray was negative.  COVID was also negative  CT chest with contrast Filling defects within right lower lobe pulmonary arterial branches compatible with pulmonary emboli. Associated right basilar airspace opacity could reflect atelectasis or infarcts.  Elevation of the right hemidiaphragm.  Coronary artery disease.  Aortic Atherosclerosis   Acute pulmonary embolism (HCC) Start heparin gtt. Likely primary cause for his respiratory distress and oxygen requirements. O2 as needed Supportive care Pharmacy consult for transition to po DOac in am   UTI (urinary tract infection)/abnormal UA in the setting of chronic suprapubic catheter Check urinalysis Check urine culture Patient has indwelling suprapubic catheter and urine had a lot of sediment in it. Will change suprapubic catheter. Change was August 25th 2023 at urology office patient is afebrile will hold off antibiotic for now   Chronic diastolic CHF (congestive heart failure) (HCC) Continue ACE inhibitor On carvedilol Lasix as needed BNP is slightly  elevated but chest x-ray does not show fluid overload   Seizure disorder (HCC) Continue phenobarbital   Depression Continue Wellbutrin, Abilify   Hypertension Continue lisinopril, carvedilol   Mixed hyperlipidemia Continue Pravachol   BPH (benign prostatic hyperplasia) Continue Proscar   Normocytic anemia Hemoglobin is stable at 9.7 Continue iron Continue folate   Hypokalemia Replete and trend   COPD exacerbation (HCC) Currently no wheezing. We will hold on p.o. steroids Add additional inhaled steroids and long-acting beta muscarinic agonists. O2 as needed   Venous insufficiency of both lower extremities Chronic Lasix as needed   DVT of lower extremity, bilateral (HCC) Patient has a history of this and bilateral lower extremity swelling.  CTA reveals small pulmonary embolism likely from this.  Will need long-term anticoagulation. Will order ultrasound lower extremity         Advance Care Planning:   Code Status: Prior full   Consults: None   Family Communication: will to reach any family since no family listed in point of contact DVT Prophylaxis : heparin drip Level of care: Telemetry Medical Status is: Inpatient Remains inpatient appropriate because: discharge back to rehab in 1 to 2 days  TOTAL TIME TAKING CARE OF THIS PATIENT: 35 minutes.  >50% time spent on counselling and coordination of care  Note: This dictation was prepared with Dragon dictation along with smaller phrase technology. Any transcriptional errors that result from this process are unintentional.  Enedina Finner M.D    [facility]   CC: Primary care physician; Rosetta Posner, MD

## 2022-05-03 NOTE — Progress Notes (Signed)
PHARMACIST - PHYSICIAN ORDER COMMUNICATION  CONCERNING: P&T Medication Policy on Herbal Medications  DESCRIPTION:  This patient's order(s) for: Cranberry 250 MG CAPS  has been noted.  This product(s) is classified as an "herbal" or natural product. Due to a lack of definitive safety studies or FDA approval, nonstandard manufacturing practices, plus the potential risk of unknown drug-drug interactions while on inpatient medications, the Pharmacy and Therapeutics Committee does not permit the use of "herbal" or natural products of this type within [facility].   ACTION TAKEN: The pharmacy department is unable to verify this order at this time.  Please reevaluate patient's clinical condition at discharge and address if the herbal or natural product(s) should be resumed at that time.  Renda Rolls, PharmD, [facility] 05/03/2022 12:09 AM

## 2022-05-03 NOTE — ED Notes (Signed)
Provided made aware of the patients productive cough after consuming water. See orders for intervention.

## 2022-05-03 NOTE — Progress Notes (Signed)
ANTICOAGULATION CONSULT NOTE  Pharmacy Consult for heparin infusion Indication: pulmonary embolus  Allergies  Allergen Reactions   Penicillin G Rash and Other (See Comments)    Convulsions.    Has patient had a PCN reaction causing immediate rash, facial/tongue/throat swelling, SOB or lightheadedness with hypotension: not sure Has patient had a PCN reaction causing severe rash involving mucus membranes or skin necrosis: not sure Has patient had a PCN reaction that required hospitalization: not sure Has patient had a PCN reaction occurring within the last 10 years: not sure If all of the above answers are "NO", then may proceed with Cephalosporin use.    Patient Measurements: Height: 5\' 8"  (172.7 cm) Weight: 95 kg (209 lb 7 oz) IBW/kg (Calculated) : 68.4 Heparin Dosing Weight: 90.6 kg  Vital Signs: Temp: 98.1 F (36.7 C) (09/25 1600) Temp Source: Axillary (09/25 1600) BP: 146/88 (09/25 1800) Pulse Rate: 86 (09/25 1800)  Labs: Recent Labs    05/02/22 2121 05/03/22 0406 05/03/22 0851 05/03/22 1815  HGB 13.7 12.1*  --   --   HCT 43.1 37.7*  --   --   PLT 273 231  --   --   APTT 30  --   --   --   LABPROT 14.6  --   --   --   INR 1.2  --   --   --   HEPARINUNFRC  --   --  0.59 0.69  CREATININE 1.19 1.29*  --   --      Estimated Creatinine Clearance: 51.9 mL/min (A) (by C-G formula based on SCr of 1.29 mg/dL (H)).   Medical History: Past Medical History:  Diagnosis Date   Bipolar 1 disorder ([place])    C. difficile diarrhea 05/2016   history of ...   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   Schizophrenia ([place])    Seizure  ([place])    on phenobarb   Urinary retention    frequently requiring foley placement   VRE (vancomycin resistant enterococcus) culture positive    urine    Assessment: Pt is a 81 yo male presenting to ED c/o SOB, found with "defects within right lower lobe pulmonary arterial branches compatible with pulmonary emboli."  9/25 0851 HL 0.59  9/26 1815 HL 0.69  Goal of Therapy:  Heparin level 0.3-0.7 units/ml Monitor platelets by anticoagulation protocol: Yes   Plan:  Heparin level remains therapeutic. Will continue heparin infusion at 1500 units/hr. Recheck heparin level with AM labs. CBC daily while on heparin    Dorothe Pea, PharmD, BCPS Clinical Pharmacist   05/03/2022 7:25 PM

## 2022-05-03 NOTE — Progress Notes (Addendum)
Initial Nutrition Assessment  DOCUMENTATION CODES:   Non-severe (moderate) malnutrition in context of chronic illness  INTERVENTION:   Honey Thick Mighty Shake po TID, each supplement provides 200kcal and 7g protein   Magic cup TID with meals, each supplement provides 290 kcal and 9 grams of protein  MVI po daily   Vitamin C $RemoveB'500mg'[id number]$  po BID  Pt at high refeed risk; recommend monitor potassium, magnesium and phosphorus labs daily until stable  NUTRITION DIAGNOSIS:   Moderate Malnutrition related to chronic illness as evidenced by mild fat depletion, mild muscle depletion, 18 percent weight loss in 3 months.  GOAL:   Patient will meet greater than or equal to 90% of their needs  MONITOR:   PO intake, Supplement acceptance, Labs, Weight trends, Skin, I & O's  REASON FOR ASSESSMENT:   Malnutrition Screening Tool    ASSESSMENT:   81 y/o male with h/o HTN, HLD, GERD, COPD, CHF, seizures, BPH, depression and DVT who is admitted with UTI and acute PE.  Met with pt in room today. Pt is a poor historian. Pt reports fair appetite and oral intake at baseline. Pt reports that today, he is thirsty and is wanting to drink some water. Pt seen by SLP today and placed on a dysphagia 3/honey thick diet. Plan is for MBSS tomorrow. RD will add supplements and vitamins to help pt meet her estimated needs. Pt is at high refeed risk. Per chart, pt is down 46lbs(18%) since June if his admit weight is correct; this is severe weight loss.   Medications reviewed and include: vitamin C, D3, ferrous sulfate, folic acid, insulin, MVI, KCl, heparin   Labs reviewed: K 2.6(L), creat 1.29(H) Cbgs- 126, 144, 133 x 24 hrs AIC 5.1- 9/25  NUTRITION - FOCUSED PHYSICAL EXAM:  Flowsheet Row Most Recent Value  Orbital Region Mild depletion  Upper Arm Region No depletion  Thoracic and Lumbar Region No depletion  Buccal Region Mild depletion  Temple Region Mild depletion  Clavicle Bone Region Mild depletion   Clavicle and Acromion Bone Region Mild depletion  Scapular Bone Region Mild depletion  Dorsal Hand Mild depletion  Patellar Region Moderate depletion  Anterior Thigh Region No depletion  Posterior Calf Region Mild depletion  Edema (RD Assessment) Mild  Hair Reviewed  Eyes Reviewed  Mouth Reviewed  Skin Reviewed  Nails Reviewed   Diet Order:   Diet Order             Diet NPO time specified  Diet effective now                  EDUCATION NEEDS:   Education needs have been addressed  Skin:  Skin Assessment: Reviewed RN Assessment (Stage II buttocks)  Last BM:  9/25- TYPE 5  Height:   Ht Readings from Last 1 Encounters:  05/03/22 $RemoveB'5\' 8"'SACSKcuj$  (1.727 m)    Weight:   Wt Readings from Last 1 Encounters:  05/03/22 95 kg    Ideal Body Weight:  70 kg  BMI:  Body mass index is 31.84 kg/m.  Estimated Nutritional Needs:   Kcal:  1900-2200kcal/day  Protein:  95-110g/day  Fluid:  1.8-2.1L/day  Koleen Distance MS, RD, LDN Please refer to [facility] for RD and/or RD on-call/weekend/after hours pager

## 2022-05-03 NOTE — Evaluation (Signed)
Clinical/Bedside Swallow Evaluation Patient Details  Name: [name] MRN: [id number] Date of Birth: 04-25-1941  Today's Date: 05/03/2022 Time: SLP Start Time (ACUTE ONLY): 1350 SLP Stop Time (ACUTE ONLY): 1450 SLP Time Calculation (min) (ACUTE ONLY): 60 min  Past Medical History:  Past Medical History:  Diagnosis Date   Bipolar 1 disorder ([place])    C. difficile diarrhea 05/2016   history of ...   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   Schizophrenia ([place])    Seizure ([place])    on phenobarb   Urinary retention    frequently requiring foley placement   VRE (vancomycin resistant enterococcus) culture positive    urine   Past Surgical History:  Past Surgical History:  Procedure Laterality Date   EXTRACORPOREAL SHOCK WAVE LITHOTRIPSY Right 05/05/2017   Procedure: EXTRACORPOREAL SHOCK WAVE LITHOTRIPSY (ESWL);  Surgeon: Hollice Espy, MD;  Location: [facility] ORS;  Service: Urology;  Laterality: Right;   IR CATHETER TUBE CHANGE  06/23/2017   IR CATHETER TUBE CHANGE  06/28/2017   HPI:  Pt is a 81 y.o. male with medical history significant of hypertension, hyperlipidemia, COPD, GERD, depression, Bipolar disorder, Schizophrenia, seizure, DVT not on anticoagulants, dCHF, C. difficile colitis, kidney stone, BPH, CKD-3A, obesity with BMI 36.7, positive VRE urine culture in past, who comes in today with presumed COPD exacerbation.  Per the EDP he was wheezing on admission and hypoxic.  EMS had to put him on CPAP to get his oxygen saturations up.  His blood gases were essentially normal.  He was weaned down to nasal cannula.  His chest x-ray was  negative.  COVID was also negative.     Chest CT: Filling defects within right lower lobe pulmonary arterial branches  compatible with pulmonary emboli. Associated right basilar airspace  opacity could reflect atelectasis or infarcts.  Elevation of the right hemidiaphragm.  Coronary artery disease.     OF NOTE: Pt has a h/o Dysphagia per MBSS in 01/2022. "Pt presents with mild oral phase and moderate pharyngeal phase dysphagia resulting in silent aspiration of thin liquids via spoon and cup as well as nectar thick liquids via spoon and cup.  Pt's moderate pharyngeal dysphagia is c/b delayed swallow initiation resulting in gross silent aspiration before and during the swallow when consuming thin liquids via spoon and cup as well as nectar thick liquids via spoon and cup.".    Assessment / Plan / Recommendation  Clinical Impression   Pt appears to present w/ oropharyngeal phase dysphagia w/ Neuromuscular deficits and known delay in pharyngeal swallow initiation resulting in SILENT aspiration of thin and Nectar consistency liquids -- see the H&P part of note above.  Also noted impact from having No Bottom Dentition (no lower Denture plate, wearing only the Upper Denture plate) on mastication of soft solids; increased Time needed for full chewing/mashing of foods b/f swallowing. Noted all motor movements and verbal responses were slightly slow --  suspect in setting of his medical and pschiatric dxs, medications(this was noted last evaluation in 01/2022). Pt required min+ tactile/verbal/visual cues for follow through w/ tasks. Unsure of  pt's full Cognitive status/function premorbid.    Pt consumed trials of Honey consistency liquids and Puree foods w/ no immediate, overt clinical s/s of aspiration noted; no cough and no decline in respiratory presentation noted during/post trials. O2 sats remained 94-95%. Clear vocal quality noted b/t trials. Though, 1 mildly congested cough at end of Honey consistency liquid  trials similar to premorbid coughing PRIOR TO po's given. Oral phase was c/b min slow, deliberate bolus management and oral clearing of all boluses given. He required increased Time for mastication d/t No bottom Dentition. A-P transfer was adequate w/ liquids and purees. Oral clearing achieved w/ consistencies given time. OM exam was grossly [facility] w/ no lingual weakness noted; no labial/facial asymmetry. Pt fed self w/ setup support.    In setting of increased risk for aspiration/aspiration pneumonia, hospitalization w/ concern for sedentary status secondary to illness, and overt s/s of aspiration per recent MBSS, recommend dysphagia level 3 diet(minced meats) w/ Honey consistency liquids via CUP; aspiration precautions; Pills Whole vs Crushed in puree for safety; tray setup at meals; Supervision/monitoring at meals as needed, reduce Distractions during meals. NSG/MD updated.  ST services will f/u w/ objective swallow study tomorrow for recommendations for Discharge; MD agreed. SLP Visit Diagnosis: Dysphagia, oropharyngeal phase (R13.12) (baseline deficits; baseline Cognitive decline apparent)    Aspiration Risk  Moderate aspiration risk;Risk for inadequate nutrition/hydration    Diet Recommendation   dysphagia level 3 diet(minced meats) w/ Honey consistency liquids via CUP; aspiration precautions; tray setup at meals; Supervision/monitoring at meals as needed, reduce Distractions during meals. Time b/t trials for f/u, Dry swallowing to reduce potential pharyngeal residue.  Medication Administration: Whole meds with puree (vs need to Crush in puree)    Other  Recommendations Recommended Consults:  (Dietician f/u) Oral Care Recommendations: Oral care BID;Oral care before and after PO;Staff/trained caregiver to provide oral care (Denture care) Other Recommendations: Order thickener from pharmacy;Prohibited food (jello, ice cream, thin soups);Remove water pitcher;Have oral suction available     Recommendations for follow up therapy are one component of a multi-disciplinary discharge planning process, led by the attending physician.  Recommendations may be updated based on patient status, additional functional criteria and insurance authorization.  Follow up Recommendations Skilled nursing-short term rehab (<3 hours/day) (TBD)      Assistance Recommended at Discharge Intermittent Supervision/Assistance  Functional Status Assessment Patient has had a recent decline in their functional status and/or demonstrates limited ability to make significant improvements in function in a reasonable and predictable amount of time  Frequency and Duration min 2x/week  2 weeks       Prognosis Prognosis for Safe Diet Advancement: Guarded Barriers to Reach Goals: Cognitive deficits;Language deficits;Time post onset;Severity of deficits;Behavior Barriers/Prognosis Comment: baseline dysphagia      Swallow Study   General Date of Onset: 05/02/22 HPI: Pt is a 81 y.o. male with medical history significant of hypertension, hyperlipidemia, COPD, GERD, depression, Bipolar disorder, Schizophrenia, seizure, DVT not on anticoagulants, dCHF, C. difficile colitis, kidney stone, BPH, CKD-3A, obesity with BMI 36.7, positive VRE urine culture in past, who comes in today with presumed COPD exacerbation.  Per the EDP he was wheezing on admission and hypoxic.  EMS had to put him on CPAP to get his oxygen saturations up.  His blood gases were essentially normal.  He was weaned down to nasal cannula.  His chest x-ray was negative.  COVID was also negative.   Chest CT: Filling defects within right lower lobe pulmonary arterial branches  compatible with  pulmonary emboli. Associated right basilar airspace  opacity could reflect atelectasis or infarcts.  Elevation of the right hemidiaphragm.  Coronary artery disease.   OF NOTE: Pt has a h/o Dysphagia per MBSS in 01/2022. "Pt presents with mild oral phase and moderate  pharyngeal phase dysphagia resulting in silent aspiration of thin liquids via spoon and cup as well as nectar thick liquids via spoon and cup.  Pt's moderate pharyngeal dysphagia is c/b delayed swallow initiation resulting in gross silent aspiration before and during the swallow when consuming thin liquids via spoon and cup as well as nectar thick liquids via spoon and cup.". Type of Study: Bedside Swallow Evaluation Previous Swallow Assessment: MBSS and BSE in 01/2022 Diet Prior to this Study: NPO (Dysphagia 3 w/ Honey liquids last d/c in 01/2022) Temperature Spikes Noted: No (wbc 6.4) Respiratory Status: Nasal cannula (1L) History of Recent Intubation: No Behavior/Cognition: Alert;Cooperative;Pleasant mood;Distractible;Requires cueing (baseline deficits) Oral Cavity Assessment: Within Functional Limits Oral Care Completed by SLP: Yes Oral Cavity - Dentition: Edentulous (put in the upper Denture plate for exam) Vision: Functional for self-feeding Self-Feeding Abilities: Able to feed self;Needs assist;Needs set up Patient Positioning: Upright in bed (needed positioning support) Baseline Vocal Quality: Normal (gravely) Volitional Cough: Congested Volitional Swallow: Able to elicit    Oral/Motor/Sensory Function Overall Oral Motor/Sensory Function: Within functional limits   Ice Chips Ice chips: Not tested   Thin Liquid Thin Liquid: Not tested    Nectar Thick Nectar Thick Liquid: Not tested   Honey Thick Honey Thick Liquid: Within functional limits (grossly) Presentation: Cup;Self fed (4 ozs) Other Comments: cough at end but similar to premorbid, congested cough   Puree Puree: Within functional limits (grossly) Presentation: Self Fed;Spoon (4 ozs)   Solid     Solid: Impaired Presentation: Self Fed;Spoon (7 trials) Oral Phase Impairments: Impaired mastication (missing bottom dentition) Oral Phase Functional Implications: Prolonged oral transit;Impaired mastication Pharyngeal Phase  Impairments:  (none) Other Comments: time increased         Orinda Kenner, MS, CCC-SLP Speech Language Pathologist Rehab Services; [place] [phone] (ascom) [name] 05/03/2022,3:40 PM

## 2022-05-03 NOTE — Progress Notes (Signed)
ANTICOAGULATION CONSULT NOTE  Pharmacy Consult for heparin infusion Indication: pulmonary embolus  Allergies  Allergen Reactions   Penicillin G Rash and Other (See Comments)    Convulsions.    Has patient had a PCN reaction causing immediate rash, facial/tongue/throat swelling, SOB or lightheadedness with hypotension: not sure Has patient had a PCN reaction causing severe rash involving mucus membranes or skin necrosis: not sure Has patient had a PCN reaction that required hospitalization: not sure Has patient had a PCN reaction occurring within the last 10 years: not sure If all of the above answers are "NO", then may proceed with Cephalosporin use.    Patient Measurements: Height: 5\' 8"  (172.7 cm) Weight: 95 kg (209 lb 7 oz) IBW/kg (Calculated) : 68.4 Heparin Dosing Weight: 90.6 kg  Vital Signs: Temp: 98.1 F (36.7 C) (09/25 0842) Temp Source: Axillary (09/25 0842) BP: 139/70 (09/25 1000) Pulse Rate: 72 (09/25 1000)  Labs: Recent Labs    05/02/22 2121 05/03/22 0406 05/03/22 0851  HGB 13.7 12.1*  --   HCT 43.1 37.7*  --   PLT 273 231  --   APTT 30  --   --   LABPROT 14.6  --   --   INR 1.2  --   --   HEPARINUNFRC  --   --  0.59  CREATININE 1.19 1.29*  --      Estimated Creatinine Clearance: 51.9 mL/min (A) (by C-G formula based on SCr of 1.29 mg/dL (H)).   Medical History: Past Medical History:  Diagnosis Date   Bipolar 1 disorder ([place])    C. difficile diarrhea 05/2016   history of ...   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   Schizophrenia ([place])    Seizure ([place])    on phenobarb   Urinary retention     frequently requiring foley placement   VRE (vancomycin resistant enterococcus) culture positive    urine    Assessment: Pt is a 81 yo male presenting to ED c/o SOB, found with "defects within right lower lobe pulmonary arterial branches compatible with pulmonary emboli."  9/25 0851 HL 0.59   Goal of Therapy:  Heparin level 0.3-0.7 units/ml Monitor platelets by anticoagulation protocol: Yes   Plan:  Heparin level is therapeutic. Will continue heparin infusion at 1500 units/hr. Recheck heparin level in 8 hours. CBC daily while on heparin    Eleonore Chiquito, PharmD,  05/03/2022 11:10 AM

## 2022-05-03 NOTE — Progress Notes (Signed)
ANTICOAGULATION CONSULT NOTE  Pharmacy Consult for heparin infusion Indication: pulmonary embolus  Allergies  Allergen Reactions   Penicillin G Rash and Other (See Comments)    Convulsions.    Has patient had a PCN reaction causing immediate rash, facial/tongue/throat swelling, SOB or lightheadedness with hypotension: not sure Has patient had a PCN reaction causing severe rash involving mucus membranes or skin necrosis: not sure Has patient had a PCN reaction that required hospitalization: not sure Has patient had a PCN reaction occurring within the last 10 years: not sure If all of the above answers are "NO", then may proceed with Cephalosporin use.    Patient Measurements: Height: 5\' 8"  (172.7 cm) Weight: 102.5 kg (226 lb) IBW/kg (Calculated) : 68.4 Heparin Dosing Weight: 90.6 kg  Vital Signs: Temp: 97.8 F (36.6 C) (09/24 2101) Temp Source: Axillary (09/24 2101) BP: 128/74 (09/24 2300) Pulse Rate: 79 (09/24 2300)  Labs: Recent Labs    05/02/22 2121  HGB 13.7  HCT 43.1  PLT 273  APTT 30  LABPROT 14.6  INR 1.2  CREATININE 1.19    Estimated Creatinine Clearance: 58.4 mL/min (by C-G formula based on SCr of 1.19 mg/dL).   Medical History: Past Medical History:  Diagnosis Date   Bipolar 1 disorder ([place])    C. difficile diarrhea 05/2016   history of ...   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   Schizophrenia ([place])    Seizure ([place])    on phenobarb   Urinary retention    frequently requiring foley placement   VRE (vancomycin resistant enterococcus) culture positive    urine    Assessment: Pt is a 81 yo male  presenting to ED c/o SOB, found with "defects within right lower lobe pulmonary arterial branches compatible with pulmonary emboli."  Goal of Therapy:  Heparin level 0.3-0.7 units/ml Monitor platelets by anticoagulation protocol: Yes   Plan:  Bolus 6000 units x 1 Start heparin infusion at 1500 units/hr Will check HL in 8 hr after start of infusion CBC daily while on heparin  Renda Rolls, PharmD, [facility] 05/03/2022 12:17 AM

## 2022-05-04 ENCOUNTER — Inpatient Hospital Stay: Payer: Medicare Other

## 2022-05-04 ENCOUNTER — Other Ambulatory Visit (HOSPITAL_COMMUNITY): Payer: Self-pay

## 2022-05-04 ENCOUNTER — Telehealth (HOSPITAL_COMMUNITY): Payer: Self-pay | Admitting: Pharmacy Technician

## 2022-05-04 DIAGNOSIS — I2699 Other pulmonary embolism without acute cor pulmonale: Secondary | ICD-10-CM | POA: Diagnosis not present

## 2022-05-04 DIAGNOSIS — I82402 Acute embolism and thrombosis of unspecified deep veins of left lower extremity: Secondary | ICD-10-CM

## 2022-05-04 DIAGNOSIS — J441 Chronic obstructive pulmonary disease with (acute) exacerbation: Secondary | ICD-10-CM

## 2022-05-04 DIAGNOSIS — J9601 Acute respiratory failure with hypoxia: Principal | ICD-10-CM

## 2022-05-04 LAB — CBC
HCT: 35.3 % — ABNORMAL LOW (ref 39.0–52.0)
Hemoglobin: 11.7 g/dL — ABNORMAL LOW (ref 13.0–17.0)
MCH: 28.1 pg (ref 26.0–34.0)
MCHC: 33.1 g/dL (ref 30.0–36.0)
MCV: 84.7 fL (ref 80.0–100.0)
Platelets: 270 10*3/uL (ref 150–400)
RBC: 4.17 MIL/uL — ABNORMAL LOW (ref 4.22–5.81)
RDW: 15 % (ref 11.5–15.5)
WBC: 9.6 10*3/uL (ref 4.0–10.5)
nRBC: 0 % (ref 0.0–0.2)

## 2022-05-04 LAB — GLUCOSE, CAPILLARY
Glucose-Capillary: 130 mg/dL — ABNORMAL HIGH (ref 70–99)
Glucose-Capillary: 155 mg/dL — ABNORMAL HIGH (ref 70–99)
Glucose-Capillary: 178 mg/dL — ABNORMAL HIGH (ref 70–99)
Glucose-Capillary: 160 mg/dL — ABNORMAL HIGH (ref 70–99)

## 2022-05-04 LAB — BASIC METABOLIC PANEL
Anion gap: 7 (ref 5–15)
BUN: 31 mg/dL — ABNORMAL HIGH (ref 8–23)
CO2: 24 mmol/L (ref 22–32)
Calcium: 8.6 mg/dL — ABNORMAL LOW (ref 8.9–10.3)
Chloride: 111 mmol/L (ref 98–111)
Creatinine, Ser: 1.57 mg/dL — ABNORMAL HIGH (ref 0.61–1.24)
GFR, Estimated: 45 mL/min — ABNORMAL LOW (ref >60)
Glucose, Bld: 141 mg/dL — ABNORMAL HIGH (ref 70–99)
Potassium: 2.3 mmol/L — CL (ref 3.5–5.1)
Sodium: 142 mmol/L (ref 135–145)

## 2022-05-04 LAB — HEPARIN LEVEL (UNFRACTIONATED): Heparin Unfractionated: 0.46 IU/mL (ref 0.30–0.70)

## 2022-05-04 LAB — MAGNESIUM: Magnesium: 2.1 mg/dL (ref 1.7–2.4)

## 2022-05-04 LAB — POTASSIUM: Potassium: 2.5 mmol/L — CL (ref 3.5–5.1)

## 2022-05-04 MED ORDER — GUAIFENESIN 100 MG/5ML PO LIQD: 5.0000 mL | ORAL | Status: DC | PRN

## 2022-05-04 MED ORDER — ACETYLCYSTEINE 20 % IN SOLN
4.0000 mL | Freq: Once | RESPIRATORY_TRACT | Status: DC
  Filled 2022-05-04: qty 4

## 2022-05-04 MED ORDER — APIXABAN 5 MG PO TABS
10.0000 mg | ORAL_TABLET | Freq: Two times a day (BID) | ORAL | Status: DC
  Administered 2022-05-04: 10 mg via ORAL
  Filled 2022-05-04: qty 2

## 2022-05-04 MED ORDER — ORAL CARE MOUTH RINSE
15.0000 mL | OROMUCOSAL | Status: DC
  Administered 2022-05-04 – 2022-05-16 (×46): 15 mL via OROMUCOSAL

## 2022-05-04 MED ORDER — ACETYLCYSTEINE 20 % IN SOLN
4.0000 mL | Freq: Two times a day (BID) | RESPIRATORY_TRACT | Status: AC
  Administered 2022-05-04 – 2022-05-08 (×7): 4 mL via RESPIRATORY_TRACT
  Filled 2022-05-04 (×8): qty 4

## 2022-05-04 MED ORDER — APIXABAN 5 MG PO TABS: 5.0000 mg | ORAL_TABLET | Freq: Two times a day (BID) | ORAL | Status: DC

## 2022-05-04 MED ORDER — POTASSIUM CHLORIDE 10 MEQ/100ML IV SOLN
10.0000 meq | INTRAVENOUS | Status: AC
  Administered 2022-05-04 (×4): 10 meq via INTRAVENOUS
  Filled 2022-05-04 (×4): qty 100

## 2022-05-04 MED ORDER — SODIUM CHLORIDE 3 % IN NEBU: 4.0000 mL | INHALATION_SOLUTION | Freq: Every day | RESPIRATORY_TRACT | Status: DC

## 2022-05-04 MED ORDER — ORAL CARE MOUTH RINSE: 15.0000 mL | OROMUCOSAL | Status: DC | PRN

## 2022-05-04 NOTE — Progress Notes (Signed)
ANTICOAGULATION CONSULT NOTE  Pharmacy Consult for heparin infusion Indication: pulmonary embolus  Allergies  Allergen Reactions   Penicillin G Rash and Other (See Comments)    Convulsions.    Has patient had a PCN reaction causing immediate rash, facial/tongue/throat swelling, SOB or lightheadedness with hypotension: not sure Has patient had a PCN reaction causing severe rash involving mucus membranes or skin necrosis: not sure Has patient had a PCN reaction that required hospitalization: not sure Has patient had a PCN reaction occurring within the last 10 years: not sure If all of the above answers are "NO", then may proceed with Cephalosporin use.    Patient Measurements: Height: 5\' 8"  (172.7 cm) Weight: 95 kg (209 lb 7 oz) IBW/kg (Calculated) : 68.4 Heparin Dosing Weight: 90.6 kg  Vital Signs: Temp: 98.2 F (36.8 C) (09/26 0512) Temp Source: Oral (09/26 0512) BP: 130/79 (09/26 0512) Pulse Rate: 81 (09/26 0512)  Labs: Recent Labs    05/02/22 2121 05/03/22 0406 05/03/22 0851 05/03/22 1815 05/04/22 0448  HGB 13.7 12.1*  --   --  11.7*  HCT 43.1 37.7*  --   --  35.3*  PLT 273 231  --   --  270  APTT 30  --   --   --   --   LABPROT 14.6  --   --   --   --   INR 1.2  --   --   --   --   HEPARINUNFRC  --   --  0.59 0.69 0.46  CREATININE 1.19 1.29*  --   --  1.57*     Estimated Creatinine Clearance: 42.6 mL/min (A) (by C-G formula based on SCr of 1.57 mg/dL (H)).   Medical History: Past Medical History:  Diagnosis Date   Bipolar 1 disorder ([place])    C. difficile diarrhea 05/2016   history of ...   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/t frequent  foley catheters   Schizophrenia ([place])    Seizure ([place])    on phenobarb   Urinary retention    frequently requiring foley placement   VRE (vancomycin resistant enterococcus) culture positive    urine    Assessment: Pt is a 81 yo male presenting to ED c/o SOB, found with "defects within right lower lobe pulmonary arterial branches compatible with pulmonary emboli."  9/25 0851 HL 0.59  9/25 1815 HL 0.69 9/26 0448 HL 0.46, therapeutic X 3   Goal of Therapy:  Heparin level 0.3-0.7 units/ml Monitor platelets by anticoagulation protocol: Yes   Plan:  9/26:  HL @ 0448 = 0.46, therapeutic X 3 Will continue pt on current rate and recheck HL on 9/27 with AM labs.   Orene Desanctis, PharmD Clinical Pharmacist   05/04/2022 6:33 AM

## 2022-05-04 NOTE — Telephone Encounter (Signed)
Pharmacy Patient Advocate Encounter  Insurance verification completed.    The patient is insured through [employer] Medicare Part D   The patient is currently admitted and ran test claims for the following: Eliquis .  Copays and coinsurance results were relayed to Inpatient clinical team.  

## 2022-05-04 NOTE — Progress Notes (Addendum)
       CROSS COVER NOTE  NAME: [name] MRN: [id number] DOB : 03-Jan-1941    Date of Service   05/04/2022   HPI/Events of Note   Notified of unequal pupils after transferring from ICU to 2A. 2A RN confirmed with ICU RN that pupils were assessed and noted to be equal earlier tonight prior to transfer.  Mr [name] reports that he was told previously that he had unequal pupils after hitting his head on a bedrail at his facility, he does not recall which side was larger. On assessment (R) pupil 32mm, (L) pupil 22mm both are round and reactive to light, no focal deficits appreciated on exam. On review of chart there is no documentation noting unequal pupils.    0610: Notified of Critical K--> 2.3 down from 2.6 yesterday  Interventions   Assessment/Plan: CT Head - negative 40 mEQ IV K, continue PO K supplementation as ordered      This document was prepared using Dragon voice recognition software and may include unintentional dictation errors.  [name] Glass DNP, MHA, FNP-BC Nurse Practitioner [facility] Pager [phone]

## 2022-05-04 NOTE — Progress Notes (Signed)
[place] [facility] [place] NAME: [name]    MR#:  [id number]  DATE OF BIRTH:  May 14, 1941  SUBJECTIVE:  poor historian at baseline. Patient was sent in from his nursing home facility with increasing shortness of breath. Workup showed he has pulmonary embolism. Patient has been having some issues with swallowing and speech therapy working with him. No family at bedside. No family listed in the contact information. Appears patient is bedbound suprapubic catheter. Pt is bed bound and WC   VITALS:  Blood pressure (!) 157/86, pulse 93, temperature 98.3 F (36.8 C), resp. rate 16, height 5\' 8"  (1.727 m), weight 95 kg, SpO2 95 %.  PHYSICAL EXAMINATION:   GENERAL:  81 y.o.-year-old patient lying in the bed with no acute distress. Obese. Chronically ill appearing. LUNGS: Normal breath sounds bilaterally CARDIOVASCULAR: S1, S2 normal. No murmurs, rubs, or gallops.  ABDOMEN: Soft, nontender,distended. Rapidly catheter present EXTREMITIES: chronic bilateral lower extremity edema with significant dry skin and poor hygiene NEUROLOGIC: nonfocal  patient is alert and awake SKIN: per RN decubitus stage II on buttock  LABORATORY PANEL:  CBC Recent Labs  Lab 05/04/22 0448  WBC 9.6  HGB 11.7*  HCT 35.3*  PLT 270     Chemistries  Recent Labs  Lab 05/02/22 2121 05/03/22 0406 05/04/22 0448 05/04/22 1329  NA 144   < > 142  --   K 2.9*   < > 2.3* 2.5*  CL 107   < > 111  --   CO2 27   < > 24  --   GLUCOSE 143*   < > 141*  --   BUN 14   < > 31*  --   CREATININE 1.19   < > 1.57*  --   CALCIUM 8.7*   < > 8.6*  --   MG  --   --  2.1  --   AST 16  --   --   --   ALT 10  --   --   --   ALKPHOS 100  --   --   --   BILITOT 0.9  --   --   --    < > = values in this interval not displayed.    Cardiac Enzymes No results for input(s): "TROPONINI" in the last 168 hours. RADIOLOGY:  US Venous Img Lower Bilateral (DVT)  Result Date:  05/04/2022 CLINICAL DATA:  81 year old male with pulmonary embolism in lower extremity swelling. EXAM: BILATERAL LOWER EXTREMITY VENOUS DOPPLER ULTRASOUND TECHNIQUE: Gray-scale sonography with graded compression, as well as color Doppler and duplex ultrasound were performed to evaluate the lower extremity deep venous systems from the level of the common femoral vein and including the common femoral, femoral, profunda femoral, popliteal and calf veins including the posterior tibial, peroneal and gastrocnemius veins when visible. The superficial great saphenous vein was also interrogated. Spectral Doppler was utilized to evaluate flow at rest and with distal augmentation maneuvers in the common femoral, femoral and popliteal veins. COMPARISON:  01/10/2022 FINDINGS: RIGHT LOWER EXTREMITY Common Femoral Vein: Nonocclusive, heterogeneously isoechoic thrombus is present peripherally. The common femoral vein appears patent with normal respiratory variation centrally. Saphenofemoral Junction: Nonocclusive, heterogeneously isoechoic thrombus is present. Profunda Femoral Vein: No evidence of thrombus. Normal compressibility and flow on color Doppler imaging. Femoral Vein: Nonocclusive, heterogeneously isoechoic thrombus is present throughout. Popliteal Vein: No evidence of thrombus. Normal compressibility, respiratory phasicity and response to augmentation. Calf Veins: No evidence of  thrombus. Normal compressibility and flow on color Doppler imaging. Other Findings:  None. LEFT LOWER EXTREMITY Common Femoral Vein: No evidence of thrombus. Normal compressibility, respiratory phasicity and response to augmentation. Saphenofemoral Junction: No evidence of thrombus. Normal compressibility and flow on color Doppler imaging. Profunda Femoral Vein: No evidence of thrombus. Normal compressibility and flow on color Doppler imaging. Femoral Vein: No evidence of thrombus. Normal compressibility, respiratory phasicity and response to  augmentation. Popliteal Vein: No evidence of thrombus. Normal compressibility, respiratory phasicity and response to augmentation. Calf Veins: No evidence of thrombus. Normal compressibility and flow on color Doppler imaging. Other Findings:  None. IMPRESSION: 1. Subacute appearing right lower extremity deep vein thrombosis extending from the peripheral common femoral vein through the peripheral femoral vein. No definite evidence of iliac extension. 2. No evidence of left lower extremity deep vein thrombosis. Ruthann Cancer, MD [facility] Radiology Electronically Signed   By: Ruthann Cancer M.D.   On: 05/04/2022 08:08   CT HEAD WO CONTRAST (5MM)  Result Date: 05/04/2022 CLINICAL DATA:  Altered mental status, unequal pupils EXAM: CT HEAD WITHOUT CONTRAST TECHNIQUE: Contiguous axial images were obtained from the base of the skull through the vertex without intravenous contrast. RADIATION DOSE REDUCTION: This exam was performed according to the departmental dose-optimization program which includes automated exposure control, adjustment of the mA and/or kV according to patient size and/or use of iterative reconstruction technique. COMPARISON:  01/10/2022 FINDINGS: Brain: No evidence of acute infarction, hemorrhage, mass, mass effect, or midline shift. No hydrocephalus or extra-axial fluid collection. Periventricular white matter changes, likely the sequela of chronic small vessel ischemic disease. Vascular: No hyperdense vessel. Atherosclerotic calcifications in the intracranial carotid and vertebral arteries. Skull: Normal. Negative for fracture or focal lesion. Sinuses/Orbits: No acute finding. Other: Fluid in the right mastoid air cells. IMPRESSION: No acute intracranial process. Electronically Signed   By: Merilyn Baba M.D.   On: 05/04/2022 02:25   CT Angio Chest PE W and/or Wo Contrast  Result Date: 05/02/2022 CLINICAL DATA:  Pulmonary embolism (PE) suspected,  high prob. Shortness of breath EXAM: CT ANGIOGRAPHY CHEST WITH CONTRAST TECHNIQUE: Multidetector CT imaging of the chest was performed using the standard protocol during bolus administration of intravenous contrast. Multiplanar CT image reconstructions and MIPs were obtained to evaluate the vascular anatomy. RADIATION DOSE REDUCTION: This exam was performed according to the departmental dose-optimization program which includes automated exposure control, adjustment of the mA and/or kV according to patient size and/or use of iterative reconstruction technique. CONTRAST:  49mL OMNIPAQUE IOHEXOL 350 MG/ML SOLN COMPARISON:  10/02/2012 FINDINGS: Cardiovascular: Filling defects within right lower lobe pulmonary arterial branches compatible with pulmonary emboli. No pulmonary emboli on the left. No evidence of right heart strain. Heart is normal size. Aorta normal caliber. Coronary artery and aortic calcifications. Mediastinum/Nodes: No mediastinal, hilar, or axillary adenopathy. Trachea and esophagus are unremarkable. Thyroid unremarkable. Lungs/Pleura: Elevation of the right hemidiaphragm. Right basilar opacity could reflect atelectasis or early infarct. Left pendant atelectasis. No effusions. Upper Abdomen: Gaseous distention of bowel in the upper abdomen. Musculoskeletal: Chest wall soft tissues are unremarkable. No acute bony abnormality. Review of the MIP images confirms the above findings. IMPRESSION: Filling defects within right lower lobe pulmonary arterial branches compatible with pulmonary emboli. Associated right basilar airspace opacity could reflect atelectasis or infarcts. Elevation of the right hemidiaphragm. Coronary artery disease. Aortic Atherosclerosis (ICD10-I70.0). These results were called by telephone at the time of interpretation on 05/02/2022 at 11:47 pm to provider PATRICK  ROBINSON , who verbally acknowledged these results. Electronically Signed   By: Rolm Baptise M.D.   On: 05/02/2022 23:48    DG Chest Port 1 View  Result Date: 05/02/2022 CLINICAL DATA:  Questionable sepsis - evaluate for abnormality. Shortness of breath EXAM: PORTABLE CHEST 1 VIEW COMPARISON:  01/10/2022 FINDINGS: Heart and mediastinal contours are within normal limits. No focal opacities or effusions. No acute bony abnormality. IMPRESSION: No active disease. Electronically Signed   By: Rolm Baptise M.D.   On: 05/02/2022 22:06    Assessment and Plan  MACLAREN ARROYAVE is a 81 y.o. male with medical history significant of HTN, HLD, GERD, COPD, CHF (diastolic), seizure disorder, BPH who comes in today with presumed COPD exacerbation.  Per the EDP he was wheezing on admission and hypoxic.  EMS had to put him on CPAP to get his oxygen saturations up.  His blood gases were essentially normal.  He was weaned down to nasal cannula.  His chest x-ray was negative.  COVID was also negative  CT chest with contrast Filling defects within right lower lobe pulmonary arterial branches compatible with pulmonary emboli. Associated right basilar airspace opacity could reflect atelectasis or infarcts.  Elevation of the right hemidiaphragm.  Coronary artery disease.  Aortic Atherosclerosis   Acute pulmonary embolism (HCC) Acute DVT left LE --Start heparin gtt. --Likely primary cause for his respiratory distress and oxygen requirements. O2 as needed --Supportive care --Pharmacy consult for transition to po DOAC today US doppler LE 1. Subacute appearing right lower extremity deep vein thrombosis extending from the peripheral common femoral vein through the peripheral femoral vein. No definite evidence of iliac extension. 2. No evidence of left lower extremity deep vein thrombosis   UTI (urinary tract infection)/abnormal UA in the setting of chronic suprapubic catheter --Patient has indwelling suprapubic catheter and urine had a lot of sediment in it. --Will change suprapubic catheter (05/03/22) --Change was August 25th 2023 at  urology office --patient is afebrile will hold off antibiotic for now   Chronic diastolic CHF (congestive heart failure) (HCC) --Continue ACE inhibitor --On carvedilol --Lasix as needed --BNP is slightly elevated but chest x-ray does not show fluid overload   Seizure disorder (HCC) --Continue phenobarbital   Depression Continue Wellbutrin, Abilify   Hypertension --Continue lisinopril, carvedilol   Mixed hyperlipidemia Continue Pravachol   BPH (benign prostatic hyperplasia) --Continue Proscar   Normocytic anemia Hemoglobin is stable at 9.7   Hypokalemia Replete and trend -- despite IV and PO potassium still down to 2.5. Will continue to replete today   COPD exacerbation ([place]) Currently no wheezing. Continue inhalers   Venous insufficiency of both lower extremities Chronic Lasix as needed   continue to replace potassium once replete did patient can return back to rehab.      Advance Care Planning:   Code Status: Prior full   Consults: None   Family Communication: will to reach any family since no family listed in point of contact DVT Prophylaxis :eliquis Level of care: Telemetry Medical Status is: Inpatient Remains inpatient appropriate because: discharge back to rehab once K replaced    TOTAL TIME TAKING CARE OF THIS PATIENT: 35 minutes.  >50% time spent on counselling and coordination of care  Note: This dictation was prepared with Dragon dictation along with smaller phrase technology. Any transcriptional errors that result from this process are unintentional.  Fritzi Mandes M.D    [facility]   CC: Primary care physician; Rica Koyanagi, MD

## 2022-05-04 NOTE — TOC Initial Note (Signed)
Transition of Care [facility]) - Initial/Assessment Note    Patient Details  Name: [name] MRN: [id number] Date of Birth: [birth year]  [facility] CM/SW Contact:    Margarito Liner, LCSW Phone Number: 05/04/2022, 12:22 PM  Clinical Narrative:  Per chart review, patient admitted from [facility]. Admissions assistant confirmed he is a long-term care resident. No emergency contacts listed in patient's chart. The only emergency contact SNF has is for the owner of the ALF he lived at prior to moving there.               Expected Discharge Plan: Skilled Nursing Facility Barriers to Discharge: Continued Medical Work up   Patient Goals and CMS Choice   CMS Medicare.gov Compare Post Acute Care list provided to::  (N/A) Choice offered to / list presented to : NA  Expected Discharge Plan and Services Expected Discharge Plan: Skilled Nursing Facility     Post Acute Care Choice: Resumption of Svcs/PTA Provider Living arrangements for the past 2 months: Skilled Nursing Facility                                      Prior Living Arrangements/Services Living arrangements for the past 2 months: Skilled Nursing Facility Lives with:: Facility Resident Patient language and need for interpreter reviewed:: Yes          Care giver support system in place?: Yes (comment)   Criminal Activity/Legal Involvement Pertinent to Current Situation/Hospitalization: No - Comment as needed  Activities of Daily Living   ADL Screening (condition at time of admission) Patient's cognitive ability adequate to safely complete daily activities?: No Is the patient deaf or have difficulty hearing?: Yes Does the patient have difficulty seeing, even when wearing glasses/contacts?: No Does the patient have difficulty concentrating, remembering, or making decisions?: Yes Patient able to express need for assistance with ADLs?: No Does the patient have difficulty dressing or bathing?:  Yes Independently performs ADLs?: No Does the patient have difficulty walking or climbing stairs?: Yes Weakness of Legs: Right Weakness of Arms/Hands: Right  Permission Sought/Granted                  Emotional Assessment       Orientation: : Oriented to Self, Oriented to Place, Oriented to  Time Alcohol / Substance Use: Not Applicable Psych Involvement: No (comment)  Admission diagnosis:  COPD exacerbation (HCC) [J44.1] Acute respiratory failure with hypoxia (HCC) [J96.01] Patient Active Problem List   Diagnosis Date Noted   Malnutrition of moderate degree 05/03/2022   COPD exacerbation (HCC) 05/02/2022   Acute pulmonary embolism (HCC) 05/02/2022   Dysphagia 01/12/2022   Cellulitis of left lower extremity 01/10/2022   Hypomagnesemia 01/10/2022   Hypertension    Depression    Chronic diastolic CHF (congestive heart failure) (HCC)    Chronic kidney disease, stage 3a (HCC)    Acute metabolic encephalopathy    Normocytic anemia    Hematuria 11/25/2016   Guaiac positive stools 11/25/2016   Pressure injury of skin 09/23/2016   Back pain 05/28/2016   Sepsis (HCC) 05/27/2016   UTI (urinary tract infection) 05/27/2016   DVT of lower extremity, bilateral (HCC) 05/27/2016   Bladder outlet obstruction 05/27/2016   BPH (benign prostatic hyperplasia) 05/27/2016   Acute on chronic renal failure (HCC) 05/27/2016   Bipolar disorder (HCC) 05/27/2016   Generalized weakness 05/27/2016   Chronic venous stasis  dermatitis 05/27/2016   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 [place] [address] Phone: [phone] Fax: [phone]     Social Determinants of Health (SDOH) Interventions    Readmission Risk Interventions    05/04/2022   12:15 PM  Readmission Risk Prevention Plan  Transportation Screening Complete  Medication Review (RN Care Manager) Complete  PCP or Specialist appointment within 3-5 days of discharge Complete  SW Recovery Care/Counseling Consult Complete  [place] Complete

## 2022-05-04 NOTE — TOC Benefit Eligibility Note (Signed)
Patient Teacher, English as a foreign language completed.    The patient is currently admitted and upon discharge could be taking Eliquis Starter Pack.  The current 30 day co-pay is $4.30.   The patient is insured through [place], [place] Patient Advocate Specialist [place] Patient Advocate Team Direct Number: [phone]  Fax: [phone]

## 2022-05-04 NOTE — [place] FL2 (Signed)
[place] MEDICAID FL2 LEVEL OF CARE SCREENING TOOL     IDENTIFICATION  Patient Name: [name] Birthdate: [birth year] Sex: male Admission Date (Current Location): 05/02/2022  [facility] and [place] Number:  Chiropodist and Address:  [facility], [address] [place], [place] [address]      Provider Number: [id number]  Attending Physician Name and Address:  Enedina Finner, MD  Relative Name and Phone Number:       Current Level of Care: Hospital Recommended Level of Care: Skilled Nursing Facility Prior Approval Number:    Date Approved/Denied:   PASRR Number: [id number]  Discharge Plan: SNF    Current Diagnoses: Patient Active Problem List   Diagnosis Date Noted   Malnutrition of moderate degree 05/03/2022   COPD exacerbation (HCC) 05/02/2022   Acute pulmonary embolism (HCC) 05/02/2022   Dysphagia 01/12/2022   Cellulitis of left lower extremity 01/10/2022   Hypomagnesemia 01/10/2022   Hypertension    Depression    Chronic diastolic CHF (congestive heart failure) (HCC)    Chronic kidney disease, stage 3a (HCC)    Acute metabolic encephalopathy    Normocytic anemia    Hematuria 11/25/2016   Guaiac positive stools 11/25/2016   Pressure injury of skin 09/23/2016   Back pain 05/28/2016   Sepsis (HCC) 05/27/2016   UTI (urinary tract infection) 05/27/2016   DVT of lower extremity, bilateral (HCC) 05/27/2016   Bladder outlet obstruction 05/27/2016   BPH (benign prostatic hyperplasia) 05/27/2016   Acute on chronic renal failure (HCC) 05/27/2016   Bipolar disorder (HCC) 05/27/2016   Generalized weakness 05/27/2016   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) 08/07/2015   Hypokalemia 02/21/2015   Venous insufficiency of both lower extremities 12/04/2014   Benign essential hypertension 11/25/2014   Bilateral carotid artery stenosis 11/15/2014   Bilateral leg  edema 11/15/2014   COPD (chronic obstructive pulmonary disease) (HCC) 11/15/2014   Gastroesophageal reflux disease 11/15/2014   Mild aortic valve stenosis 11/15/2014   Mixed hyperlipidemia 11/15/2014   Obesity 11/15/2014   Seizure disorder (HCC) 11/15/2014    Orientation RESPIRATION BLADDER Height & Weight     Self, Time, Place  Normal  (Suprapubic catheter) Weight: 209 lb 7 oz (95 kg) Height:  5\' 8"  (172.7 cm)  BEHAVIORAL SYMPTOMS/MOOD NEUROLOGICAL BOWEL NUTRITION STATUS   (None)  (Seizure disorder) Continent Diet (DYS 3, fluid honey thick. Meats MINCED w/ Gravies added to moisten well.)  AMBULATORY STATUS COMMUNICATION OF NEEDS Skin     Verbally PU Stage and Appropriate Care, Other (Comment) (Dark area with scab on left lateral heel: Foam.)   PU Stage 2 Dressing:  (Right and left buttocks: Foam.)                   Personal Care Assistance Level of Assistance              Functional Limitations Info  Sight, Hearing, Speech Sight Info: Adequate Hearing Info: Adequate Speech Info: Adequate    SPECIAL CARE FACTORS FREQUENCY                       Contractures Contractures Info: Not present    Additional Factors Info  Code Status, Allergies, Isolation Precautions, Psychotropic Code Status Info: Full code Allergies Info: Penicillin G Psychotropic Info: Bipolar Disorder   Isolation Precautions Info: Droplet precautions     Current Medications (05/04/2022):  This is the  current hospital active medication list Current Facility-Administered Medications  Medication Dose Route Frequency Provider Last Rate Last Admin   0.9 %  sodium chloride infusion  250 mL Intravenous PRN Donnamae Jude, MD       acetaminophen (TYLENOL) tablet 650 mg  650 mg Oral Q6H PRN Donnamae Jude, MD       Or   acetaminophen (TYLENOL) suppository 650 mg  650 mg Rectal Q6H PRN Donnamae Jude, MD       albuterol (PROVENTIL) (2.5 MG/3ML) 0.083% nebulizer solution 2.5 mg  2.5 mg Nebulization  Q2H PRN Donnamae Jude, MD       apixaban Arne [place]) tablet 10 mg  10 mg Oral BID Delena Bali, RPH   10 mg at 05/04/22 1039   Followed by   Derrill Memo ON 05/11/2022] apixaban (ELIQUIS) tablet 5 mg  5 mg Oral BID Delena Bali, RPH       ARIPiprazole (ABILIFY) tablet 20 mg  20 mg Oral Daily Donnamae Jude, MD   20 mg at 05/04/22 1042   ascorbic acid (VITAMIN C) tablet 500 mg  500 mg Oral BID Donnamae Jude, MD   500 mg at 05/04/22 1039   buPROPion (WELLBUTRIN XL) 24 hr tablet 300 mg  300 mg Oral Daily Donnamae Jude, MD   300 mg at 05/04/22 1039   carvedilol (COREG) tablet 25 mg  25 mg Oral BID Donnamae Jude, MD   25 mg at 05/04/22 1039   Chlorhexidine Gluconate Cloth 2 % PADS 6 each  6 each Topical Daily Fritzi Mandes, MD   6 each at 05/04/22 1050   cholecalciferol (VITAMIN D3) 25 MCG (1000 UNIT) tablet 1,000 Units  1,000 Units Oral q AM Donnamae Jude, MD   1,000 Units at 05/04/22 0837   dextromethorphan (DELSYM) 30 MG/5ML liquid 30 mg  5 mL Oral Q12H Donnamae Jude, MD   30 mg at 05/04/22 1049   ferrous sulfate tablet 325 mg  325 mg Oral Q breakfast Donnamae Jude, MD   325 mg at 05/04/22 4174   finasteride (PROSCAR) tablet 5 mg  5 mg Oral Daily Donnamae Jude, MD   5 mg at 05/04/22 1039   fluticasone furoate-vilanterol (BREO ELLIPTA) 100-25 MCG/ACT 1 puff  1 puff Inhalation Daily Donnamae Jude, MD   1 puff at 03/[birth year]/47 1856   folic acid (FOLVITE) tablet 1 mg  1 mg Oral q AM Donnamae Jude, MD   1 mg at 05/04/22 3149   HYDROcodone-acetaminophen (NORCO/VICODIN) 5-325 MG per tablet 1-2 tablet  1-2 tablet Oral Q4H PRN Donnamae Jude, MD       insulin aspart (novoLOG) injection 0-15 Units  0-15 Units Subcutaneous TID WC Donnamae Jude, MD   2 Units at 05/04/22 1216   ipratropium-albuterol (DUONEB) 0.5-2.5 (3) MG/3ML nebulizer solution 3 mL  3 mL Nebulization Q6H Donnamae Jude, MD   3 mL at 05/04/22 0736   loratadine (CLARITIN) tablet 10 mg  10 mg Oral Daily Donnamae Jude, MD   10 mg at  05/04/22 1039   multivitamin with minerals tablet 1 tablet  1 tablet Oral Daily Donnamae Jude, MD   1 tablet at 05/04/22 1039   Oral care mouth rinse  15 mL Mouth Rinse PRN Fritzi Mandes, MD       Oral care mouth rinse  15 mL Mouth Rinse 4 times per day Fritzi Mandes, MD   15 mL at  05/04/22 1217   Oral care mouth rinse  15 mL Mouth Rinse PRN Enedina Finner, MD       PHENobarbital (LUMINAL) tablet 64.8 mg  64.8 mg Oral Jodi Geralds, MD   64.8 mg at 05/04/22 1039   polyethylene glycol (MIRALAX / GLYCOLAX) packet 17 g  17 g Oral Daily PRN Reva Bores, MD       potassium chloride SA (KLOR-CON M) CR tablet 40 mEq  40 mEq Oral BID Foust, Katy L, NP   40 mEq at 05/04/22 1038   pravastatin (PRAVACHOL) tablet 10 mg  10 mg Oral QHS Reva Bores, MD   10 mg at 05/03/22 2306   sodium chloride flush (NS) 0.9 % injection 3 mL  3 mL Intravenous Q12H Reva Bores, MD   3 mL at 05/03/22 2250   sodium chloride flush (NS) 0.9 % injection 3 mL  3 mL Intravenous PRN Reva Bores, MD       umeclidinium bromide (INCRUSE ELLIPTA) 62.5 MCG/ACT 1 puff  1 puff Inhalation Daily Reva Bores, MD   1 puff at 05/04/22 0845     Discharge Medications: Please see discharge summary for a list of discharge medications.  Relevant Imaging Results:  Relevant Lab Results:   Additional Information SS#: [id number]  Margarito Liner, LCSW

## 2022-05-04 NOTE — Consult Note (Signed)
PHARMACY CONSULT NOTE - FOLLOW UP  Pharmacy Consult for Electrolyte Monitoring and Replacement   Recent Labs: Potassium (mmol/L)  Date Value  05/04/2022 2.3 (LL)  10/03/2012 3.1 (L)   Magnesium (mg/dL)  Date Value  05/04/2022 2.1  10/02/2012 1.3 (L)   Calcium (mg/dL)  Date Value  05/04/2022 8.6 (L)   Calcium, Total (mg/dL)  Date Value  10/03/2012 8.2 (L)   Albumin (g/dL)  Date Value  05/02/2022 3.3 (L)  10/02/2012 3.4   Sodium (mmol/L)  Date Value  05/04/2022 142  10/03/2012 141     Assessment: 81 y.o. male with medical history significant of HTN, HLD, GERD, COPD, CHF (diastolic), seizure disorder, BPH who comes in today with presumed COPD exacerbation. Found to have a PE, started on heparin infusion.   Goal of Therapy:  WNL  Plan:  Medical team ordered KCL 10 mEq x 4 IV. Pt is on Kcl 40 mEq BID PO.  Recheck with AM labs.   Oswald Hillock ,PharmD Clinical Pharmacist 05/04/2022 8:03 AM

## 2022-05-04 NOTE — Progress Notes (Signed)
[place] for switching from heparin to Eliquis Indication: pulmonary embolus  Allergies  Allergen Reactions   Penicillin G Rash and Other (See Comments)    Convulsions.    Has patient had a PCN reaction causing immediate rash, facial/tongue/throat swelling, SOB or lightheadedness with hypotension: not sure Has patient had a PCN reaction causing severe rash involving mucus membranes or skin necrosis: not sure Has patient had a PCN reaction that required hospitalization: not sure Has patient had a PCN reaction occurring within the last 10 years: not sure If all of the above answers are "NO", then may proceed with Cephalosporin use.    Patient Measurements: Height: 5\' 8"  (172.7 cm) Weight: 95 kg (209 lb 7 oz) IBW/kg (Calculated) : 68.4  Vital Signs: Temp: 98.9 F (37.2 C) (09/26 0850) Temp Source: Oral (09/26 0850) BP: 148/75 (09/26 0850) Pulse Rate: 84 (09/26 0850)  Labs: Recent Labs    05/02/22 2121 05/03/22 0406 05/03/22 0851 05/03/22 1815 05/04/22 0448  HGB 13.7 12.1*  --   --  11.7*  HCT 43.1 37.7*  --   --  35.3*  PLT 273 231  --   --  270  APTT 30  --   --   --   --   LABPROT 14.6  --   --   --   --   INR 1.2  --   --   --   --   HEPARINUNFRC  --   --  0.59 0.69 0.46  CREATININE 1.19 1.29*  --   --  1.57*     Estimated Creatinine Clearance: 42.6 mL/min (A) (by C-G formula based on SCr of 1.57 mg/dL (H)).   Medical History: Past Medical History:  Diagnosis Date   Bipolar 1 disorder ([place])    C. difficile diarrhea 05/2016   history of ...   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    Schizophrenia ([place])    Seizure ([place])    on phenobarb   Urinary retention    frequently requiring foley placement   VRE (vancomycin resistant enterococcus) culture positive    urine    Assessment: Pt is a 81 yo male presenting to ED c/o SOB, found with "defects within right lower lobe pulmonary arterial branches compatible with pulmonary emboli." Pharmacy initially consulted for heparin management (drip started 9/25) and has now been consulted to manage switching from heparin to apixaban. Notable drug interactions include phenobarbital which may decrease concentrations of apixaban. However, pt takes a relatively small dose of phenobarbital and only takes it every 48 hrs, thus decreasing the estimated efficacy reduction of apixaban alongside phenobarbital from 27% to 14%. This interaction is hard to bypass altogether with the DOACs, because there are other exclusion criteria for those agents and dabigatran and edoxaban would require a parenteral bridge. As a result, clinical team decided apixaban is still the best option. Hgb trending up, Hct trending down, and PLT stable.   Plan:  Discontinue heparin drip Initiate apixaban 10 mg BID x 1 week (9/26 >> 10/2) Starting on 10/3, transition to apixaban  5 mg BID Monitor CBC weekly while on DOAC  Dara Hoyer, PharmD PGY-1 Pharmacy Resident 05/04/2022 12:05 PM

## 2022-05-04 NOTE — Plan of Care (Signed)

## 2022-05-04 NOTE — Consult Note (Addendum)
WOC Nurse Consult Note: Reason for Consult:Pressure injury to left lateral heel (Unstageable with deep tissue pressure injury), bilateral buttock with partial thickness skin loss (Stage 2 PI), POA. Patient known to our team from previous admission, seen by this writer on 01/15/22 and by my associate on 01/11/22. Wound type: pressure, moisture, venous insufficiency Pressure Injury POA: Yes Measurement:Per Nursing Flow Sheet for buttock lesions by Bedside RN E. Gannon and for left lateral heel by Bedside RN S. Meyers Bilateral buttocks Stage 2 partial thickness skin loss : 1cm round areas with pink, moist wound bed Left lateral heel with poor hygiene of foot, 2cm round eschar with surrounding purple discoloration. This is an evolving DTPI noted during last admission when it measured 3cm round. Wound bed:As noted above I will provide the patient with a mattress replacement with low air loss feature for management of microclimate and because he returns to a supine position after repositioning. Turning and repositioning is still important and time in the supine position is to be minimized. The left lateral heel wound is to be dressed daily with a betadine dressing and foot (feet) placed into pressure redistribution heel boots for foot alignment and pressure redistribution. This is an essential element of his integumentary POC.  Buttock lesions will be dressed with silicone foam bordered dressings, changed every 3 days and PRN soiling.  If desired, a Podiatric medicine consult could be obtained while in house for the left lateral heel.  [facility] nursing team will not follow, but will remain available to this patient, the nursing and medical teams.  Please re-consult if needed.  Thank you for inviting [place] to participate in this patient's Plan of Care.  Maudie Flakes, MSN, RN, CNS, [place], Serita Grammes, [employer], [employer] phone:  [phone]

## 2022-05-04 NOTE — Progress Notes (Signed)
       CROSS COVER NOTE  NAME: AKIO HUDNALL MRN: [id number] DOB : [birth year]    Date of Service   05/04/2022   HPI/Events of Note   2020: Notified of RR> 30. Per RN, Mr. Leino weak cough and has difficulties coughing up secretions. Patient is at risk for aspiration and RN confirms that he sound rhonchus.   RT at bedside for neb treatment and to do nasal tracheal suctioning   Per bedside RN, patient is rhoncus and    Interventions/ Plan   X X X       Raenette Rover, DNP, [place]

## 2022-05-05 DIAGNOSIS — I2699 Other pulmonary embolism without acute cor pulmonale: Secondary | ICD-10-CM | POA: Diagnosis not present

## 2022-05-05 LAB — CBC
HCT: 37.1 % — ABNORMAL LOW (ref 39.0–52.0)
Hemoglobin: 12 g/dL — ABNORMAL LOW (ref 13.0–17.0)
MCH: 27.4 pg (ref 26.0–34.0)
MCHC: 32.3 g/dL (ref 30.0–36.0)
MCV: 84.7 fL (ref 80.0–100.0)
Platelets: 292 10*3/uL (ref 150–400)
RBC: 4.38 MIL/uL (ref 4.22–5.81)
RDW: 15.4 % (ref 11.5–15.5)
WBC: 10.4 10*3/uL (ref 4.0–10.5)
nRBC: 0 % (ref 0.0–0.2)

## 2022-05-05 LAB — GLUCOSE, CAPILLARY
Glucose-Capillary: 147 mg/dL — ABNORMAL HIGH (ref 70–99)
Glucose-Capillary: 105 mg/dL — ABNORMAL HIGH (ref 70–99)
Glucose-Capillary: 142 mg/dL — ABNORMAL HIGH (ref 70–99)
Glucose-Capillary: 117 mg/dL — ABNORMAL HIGH (ref 70–99)

## 2022-05-05 LAB — POTASSIUM
Potassium: 2.6 mmol/L — CL (ref 3.5–5.1)
Potassium: 2.8 mmol/L — ABNORMAL LOW (ref 3.5–5.1)

## 2022-05-05 LAB — BASIC METABOLIC PANEL
Anion gap: 6 (ref 5–15)
BUN: 40 mg/dL — ABNORMAL HIGH (ref 8–23)
CO2: 23 mmol/L (ref 22–32)
Calcium: 8.7 mg/dL — ABNORMAL LOW (ref 8.9–10.3)
Chloride: 114 mmol/L — ABNORMAL HIGH (ref 98–111)
Creatinine, Ser: 2.03 mg/dL — ABNORMAL HIGH (ref 0.61–1.24)
GFR, Estimated: 33 mL/min — ABNORMAL LOW (ref >60)
Glucose, Bld: 173 mg/dL — ABNORMAL HIGH (ref 70–99)
Potassium: 2.4 mmol/L — CL (ref 3.5–5.1)
Sodium: 143 mmol/L (ref 135–145)

## 2022-05-05 LAB — MAGNESIUM: Magnesium: 2.5 mg/dL — ABNORMAL HIGH (ref 1.7–2.4)

## 2022-05-05 MED ORDER — POTASSIUM CHLORIDE 10 MEQ/100ML IV SOLN
10.0000 meq | INTRAVENOUS | Status: AC
  Administered 2022-05-05 (×3): 10 meq via INTRAVENOUS
  Filled 2022-05-05 (×3): qty 100

## 2022-05-05 MED ORDER — POTASSIUM CHLORIDE CRYS ER 20 MEQ PO TBCR: 40.0000 meq | EXTENDED_RELEASE_TABLET | Freq: Once | ORAL | Status: DC

## 2022-05-05 MED ORDER — HEPARIN (PORCINE) 25000 UT/250ML-% IV SOLN
2000.0000 [IU]/h | INTRAVENOUS | Status: DC
  Administered 2022-05-05 – 2022-05-06 (×2): 1500 [IU]/h via INTRAVENOUS
  Administered 2022-05-07: 1650 [IU]/h via INTRAVENOUS
  Filled 2022-05-05 (×3): qty 250

## 2022-05-05 MED ORDER — POTASSIUM CHLORIDE 10 MEQ/100ML IV SOLN
10.0000 meq | INTRAVENOUS | Status: AC
  Administered 2022-05-05 – 2022-05-06 (×4): 10 meq via INTRAVENOUS
  Filled 2022-05-05 (×4): qty 100

## 2022-05-05 MED ORDER — POTASSIUM CHLORIDE 20 MEQ PO PACK: 40.0000 meq | PACK | Freq: Once | ORAL | Status: DC

## 2022-05-05 MED ORDER — PHENOBARBITAL SODIUM 65 MG/ML IJ SOLN
65.0000 mg | INTRAMUSCULAR | Status: DC
  Administered 2022-05-06 – 2022-05-12 (×4): 65 mg via INTRAVENOUS
  Filled 2022-05-05 (×4): qty 1

## 2022-05-05 MED ORDER — SODIUM CHLORIDE 0.9 % IV SOLN
1.0000 g | INTRAVENOUS | Status: AC
  Administered 2022-05-05 – 2022-05-09 (×5): 1 g via INTRAVENOUS
  Filled 2022-05-05 (×6): qty 10

## 2022-05-05 MED ORDER — POTASSIUM CHLORIDE 10 MEQ/100ML IV SOLN
10.0000 meq | INTRAVENOUS | Status: AC
  Administered 2022-05-05 (×4): 10 meq via INTRAVENOUS
  Filled 2022-05-05 (×4): qty 100

## 2022-05-05 MED ORDER — HEPARIN BOLUS VIA INFUSION
5000.0000 [IU] | Freq: Once | INTRAVENOUS | Status: AC
  Administered 2022-05-05: 5000 [IU] via INTRAVENOUS
  Filled 2022-05-05: qty 5000

## 2022-05-05 NOTE — Plan of Care (Signed)

## 2022-05-05 NOTE — Consult Note (Signed)
ANTICOAGULATION CONSULT NOTE - Initial Consult  Pharmacy Consult for heparin infusion Indication: pulmonary embolus  Allergies  Allergen Reactions   Penicillin G Rash and Other (See Comments)    Convulsions.    Has patient had a PCN reaction causing immediate rash, facial/tongue/throat swelling, SOB or lightheadedness with hypotension: not sure Has patient had a PCN reaction causing severe rash involving mucus membranes or skin necrosis: not sure Has patient had a PCN reaction that required hospitalization: not sure Has patient had a PCN reaction occurring within the last 10 years: not sure If all of the above answers are "NO", then may proceed with Cephalosporin use.    Patient Measurements: Height: 5\' 8"  (172.7 cm) Weight: 95 kg (209 lb 7 oz) IBW/kg (Calculated) : 68.4 Heparin Dosing Weight: 88.3 kg   Vital Signs: Temp: 97.5 F (36.4 C) (09/27 1555) Temp Source: Axillary (09/27 1555) BP: 124/84 (09/27 1555) Pulse Rate: 81 (09/27 1555)  Labs: Recent Labs    05/02/22 2121 05/03/22 0406 05/03/22 0851 05/03/22 1815 05/04/22 0448 05/05/22 0613  HGB 13.7 12.1*  --   --  11.7* 12.0*  HCT 43.1 37.7*  --   --  35.3* 37.1*  PLT 273 231  --   --  270 292  APTT 30  --   --   --   --   --   LABPROT 14.6  --   --   --   --   --   INR 1.2  --   --   --   --   --   HEPARINUNFRC  --   --  0.59 0.69 0.46  --   CREATININE 1.19 1.29*  --   --  1.57* 2.03*    Estimated Creatinine Clearance: 33 mL/min (A) (by C-G formula based on SCr of 2.03 mg/dL (H)).   Medical History: Past Medical History:  Diagnosis Date   Bipolar 1 disorder ([place])    C. difficile diarrhea 05/2016   history of ...   CHF (congestive heart failure) (HCC)    Chronic kidney disease 05/21/2016   acute renal failure with sepsis and uti   COPD (chronic obstructive pulmonary disease) (HCC)    DVT (deep venous thrombosis) (HCC)    GERD (gastroesophageal reflux disease)    Hematuria    High cholesterol     History of kidney stones    Hypertension    Penile erosion 2018   d/t frequent foley catheters   Schizophrenia ([place])    Seizure ([place])    on phenobarb   Urinary retention    frequently requiring foley placement   VRE (vancomycin resistant enterococcus) culture positive    urine    Medications:  Apixaban 10 mg x 1 dose 9/26 1000  Assessment: 81 y.o. male with medical history significant of HTN, HLD, GERD, COPD, CHF (diastolic), seizure disorder presented to ED 05/02/22 with hypoxia and wheezing. CT chest showing PE. Patient initially started on oral therapy with apixaban and now strict NPO. Pharmacy consulted to transition to IV heparin while patient NPO  Goal of Therapy:  Heparin level 0.3-0.7 units/ml aPTT 66-102 seconds Monitor platelets by anticoagulation protocol: Yes   Plan:  Give 5000 units bolus x 1 Start heparin infusion at 1500 units/hr Check anti-Xa level  and aPTT in 8 hours given recent apixaban exposure Continue to monitor H&H and platelets  Dorothe Pea, PharmD, BCPS Clinical Pharmacist   05/05/2022,8:12 PM

## 2022-05-05 NOTE — Progress Notes (Signed)
At start of shift, respiratory rate in 30s, appears short of breath. Loud gurgling heard in upper airways when patient breathing. Sats 94%RA.Educated pt on cough, deep breathing and use of acapella. Cough congested, weak and ineffective. RT at bedside to give nebulizers and do nasotracheal suction. Slight improvement after interventions but pt still unable to effectively clear airway of secretions. Unsafe to give PO meds, discussed with NP and pt made NPO.   Continued interventions of coughing, acapella, nebs and suctioning by RT throughout night.    05/04/22 1951  Assess: MEWS Score  Temp 97.6 F (36.4 C)  BP (!) 140/92  MAP (mmHg) 107  Pulse Rate 95  Resp (!) 30  Level of Consciousness Alert  SpO2 94 %  O2 Device Room Air  Patient Activity (if Appropriate) In bed  Assess: MEWS Score  MEWS Temp 0  MEWS Systolic 0  MEWS Pulse 0  MEWS RR 2  MEWS LOC 0  MEWS Score 2  MEWS Score Color Yellow  Assess: if the MEWS score is Yellow or Red  Were vital signs taken at a resting state? Yes  Focused Assessment Change from prior assessment (see assessment flowsheet)  Does the patient meet 2 or more of the SIRS criteria? Yes  MEWS guidelines implemented *See Row Information* Yes  Treat  MEWS Interventions Escalated (See documentation below);Consulted Respiratory Therapy;Administered scheduled meds/treatments  Pain Scale 0-10  Pain Score 0  Complains of Shortness of breath  Take Vital Signs  Increase Vital Sign Frequency  Yellow: Q 2hr X 2 then Q 4hr X 2, if remains yellow, continue Q 4hrs  Escalate  MEWS: Escalate Yellow: discuss with charge nurse/RN and consider discussing with provider and RRT  Notify: Charge Nurse/RN  Name of Charge Nurse/RN Notified Alex RN  Date Charge Nurse/RN Notified 05/04/22  Notify: Provider  Provider Name/Title Foust, NP & Zebedee Iba, NP  Date Provider Notified 05/04/22  Time Provider Notified 2017  Method of Notification  (secure chat)  Notification Reason  Change in status (SOB, RR 30s, loud secretions in upper airway)  Provider response See new orders  Date of Provider Response 05/04/22  Time of Provider Response 2018  Document  Patient Outcome Stabilized after interventions  Progress note created (see row info) Yes  Assess: SIRS CRITERIA  SIRS Temperature  0  SIRS Pulse 1  SIRS Respirations  1  SIRS WBC 1  SIRS Score Sum  3

## 2022-05-05 NOTE — Progress Notes (Signed)
PROGRESS NOTE    [name]  [id number] DOB: [birth year] DOA: 05/02/2022 PCP: Rica Koyanagi, MD   Brief Narrative:  This 81 y.o. male with medical history significant of HTN, HLD, GERD, COPD, CHF (diastolic), seizure disorder, BPH who comes in ED with presumed COPD exacerbation.  Per the EDP he was wheezing on admission and hypoxic.  EMS had to put him on CPAP to get his oxygen saturations up.  His blood gases were essentially normal.  He was weaned down to nasal cannula.  His chest x-ray was negative.  COVID was also negative. CT chest with contrast Filling defects within right lower lobe pulmonary arterial branches compatible with pulmonary emboli. Associated right basilar airspace opacity could reflect atelectasis or infarcts.Elevation of the right hemidiaphragm.  Patient is admitted for acute pulmonary embolism, started on IV heparin.  Patient has significant risk of aspiration.  Speech and swallow recommended n.p.o. with alternate means of feeding.  Palliative care consulted.   Assessment & Plan:   Principal Problem:   Acute pulmonary embolism (HCC) Active Problems:   UTI (urinary tract infection)   Chronic diastolic CHF (congestive heart failure) (HCC)   Seizure disorder (HCC)   Depression   Hypertension   Mixed hyperlipidemia   BPH (benign prostatic hyperplasia)   Normocytic anemia   Hypokalemia   DVT of lower extremity, bilateral (HCC)   Venous insufficiency of both lower extremities   COPD exacerbation (HCC)   Malnutrition of moderate degree   Acute deep vein thrombosis (DVT) of left lower extremity (HCC)   Acute respiratory failure with hypoxia (HCC)  Acute pulmonary embolism / Acute Right LE DVT:  Patient found to have acute pulmonary embolism and acute Right LE DVT He was started on heparin gtt. Likely primary cause for his respiratory distress and oxygen requirements. Continue supplemental oxygen as needed. He is successfully transitioned to Eliquis.  UTI  in the setting of chronic suprapubic catheter: Patient has indwelling suprapubic catheter and urine had a lot of sediment in it. Suprapubic catheter was changed  (05/03/22) Change was August 25th 2023 at urology office Remains afebrile without leukocytosis.   We will hold on antibiotics for now.   Chronic diastolic CHF: Continue ACE inhibitor Continue carvedilol Continue Lasix as needed BNP is slightly elevated but chest x-ray does not show fluid overload.   Seizure disorder (HCC) Continue phenobarbital.   Depression Continue Wellbutrin, Abilify.   Hypertension Continue lisinopril, carvedilol.   Mixed hyperlipidemia Continue Pravachol.   BPH (benign prostatic hyperplasia) Continue Proscar.   Normocytic anemia Hemoglobin is stable.   Hypokalemia Replaced.  Continue to monitor   COPD: Currently no wheezing. Continue inhalers.   Venous insufficiency of both lower extremities Continue Lasix as needed   Dysphagia with high aspiration risk: Speech and swallow evaluation completed, high risk for aspiration recommended NPO. Patient was empirically started on ceftriaxone for aspiration pneumonia. Palliative care consulted to discuss goals of care, alternate means of feeding.  Obesity: Estimated body mass index is 31.84 kg/m as calculated from the following:   Height as of this encounter: 5\' 8"  (1.727 m).   Weight as of this encounter: 95 kg.      DVT prophylaxis: Eliquis Code Status: Full code Family Communication: No family at bedside Disposition Plan:   Status is: Inpatient Remains inpatient appropriate because: Admitted for acute PE, acute right LE DVT started on Eliquis.  Patient has dysphagia with high aspiration risk,  Palliative care consulted to discuss goals of care, alternate means of feeding.  Consultants:  Palliative care  Procedures: None Antimicrobials:  Anti-infectives (From admission, onward)    Start     Dose/Rate Route Frequency Ordered  Stop   05/05/22 0215  cefTRIAXone (ROCEPHIN) 1 g in sodium chloride 0.9 % 100 mL IVPB        1 g 200 mL/hr over 30 Minutes Intravenous Every 24 hours 05/05/22 0118 05/10/22 0214       Subjective: Patient was seen and examined at bedside.  Overnight events noted.  Patient was empirically started on ceftriaxone and Mucomyst nebulization last night due to significant coughing episode with risk of aspiration.  Objective: Vitals:   05/05/22 0745 05/05/22 0753 05/05/22 1209 05/05/22 1346  BP:  136/71 117/63   Pulse:      Resp:  (!) 32 (!) 32   Temp:  97.7 F (36.5 C) 97.6 F (36.4 C)   TempSrc:  Axillary Axillary   SpO2: 93% 98% 97% 93%  Weight:      Height:        Intake/Output Summary (Last 24 hours) at 05/05/2022 1442 Last data filed at 05/05/2022 0500 Gross per 24 hour  Intake 200 ml  Output 650 ml  Net -450 ml   Filed Weights   05/02/22 2102 05/03/22 0842  Weight: 102.5 kg 95 kg    Examination:  General exam: Appears comfortable, not in any acute distress.  Deconditioned Respiratory system: Found to have gurgling sound, decreased breath sounds, respiratory effort normal.  RR 15 Cardiovascular system: S1 & S2 heard, regular rate and rhythm, no murmur.   Gastrointestinal system: Abdomen is soft, nondistended, nontender, BS +. Central nervous system: Alert and oriented X 1 . No focal neurological deficits. Extremities: No edema, no cyanosis, no clubbing. Skin: No rashes, lesions or ulcers Psychiatry: Mood & affect appropriate.     Data Reviewed: I have personally reviewed following labs and imaging studies  CBC: Recent Labs  Lab 05/02/22 2121 05/03/22 0406 05/04/22 0448 05/05/22 0613  WBC 8.1 6.4 9.6 10.4  NEUTROABS 6.3  --   --   --   HGB 13.7 12.1* 11.7* 12.0*  HCT 43.1 37.7* 35.3* 37.1*  MCV 86.7 86.3 84.7 84.7  PLT 273 231 270 [id number]   Basic Metabolic Panel: Recent Labs  Lab 05/02/22 2120 05/02/22 2121 05/02/22 2121 05/03/22 0406 05/04/22 0448  05/04/22 1329 05/05/22 0613 05/05/22 1343  NA  --  144  --  143 142  --  143  --   K  --  2.9*   < > 2.6* 2.3* 2.5* 2.4* 2.6*  CL  --  107  --  110 111  --  114*  --   CO2  --  27  --  24 24  --  23  --   GLUCOSE  --  143*  --  148* 141*  --  173*  --   BUN  --  14  --  18 31*  --  40*  --   CREATININE  --  1.19  --  1.29* 1.57*  --  2.03*  --   CALCIUM  --  8.7*  --  8.5* 8.6*  --  8.7*  --   MG 2.2  --   --   --  2.1  --  2.5*  --    < > = values in this interval not displayed.   GFR: Estimated Creatinine Clearance: 33 mL/min (A) (by C-G formula based on SCr of 2.03 mg/dL (H)). Liver Function Tests:  Recent Labs  Lab 05/02/22 2121  AST 16  ALT 10  ALKPHOS 100  BILITOT 0.9  PROT 8.1  ALBUMIN 3.3*   No results for input(s): "LIPASE", "AMYLASE" in the last 168 hours. No results for input(s): "AMMONIA" in the last 168 hours. Coagulation Profile: Recent Labs  Lab 05/02/22 2121  INR 1.2   Cardiac Enzymes: No results for input(s): "CKTOTAL", "CKMB", "CKMBINDEX", "TROPONINI" in the last 168 hours. BNP (last 3 results) No results for input(s): "PROBNP" in the last 8760 hours. HbA1C: Recent Labs    05/03/22 0406  HGBA1C 5.1   CBG: Recent Labs  Lab 05/04/22 1200 05/04/22 1632 05/04/22 2110 05/05/22 0752 05/05/22 1133  GLUCAP 155* 178* 160* 147* 105*   Lipid Profile: No results for input(s): "CHOL", "HDL", "LDLCALC", "TRIG", "CHOLHDL", "LDLDIRECT" in the last 72 hours. Thyroid Function Tests: Recent Labs    05/02/22 2120  TSH 3.241   Anemia Panel: No results for input(s): "VITAMINB12", "FOLATE", "FERRITIN", "TIBC", "IRON", "RETICCTPCT" in the last 72 hours. Sepsis Labs: Recent Labs  Lab 05/02/22 2121 05/02/22 2319  LATICACIDVEN 1.5 0.4*    Recent Results (from the past 240 hour(s))  SARS Coronavirus 2 by RT PCR (hospital order, performed in [facility] hospital lab) *cepheid single result test* Anterior Nasal Swab     Status: None   Collection Time:  05/02/22  9:21 PM   Specimen: Anterior Nasal Swab  Result Value Ref Range Status   SARS Coronavirus 2 by RT PCR NEGATIVE NEGATIVE Final    Comment: (NOTE) SARS-CoV-2 target nucleic acids are NOT DETECTED.  The SARS-CoV-2 RNA is generally detectable in upper and lower respiratory specimens during the acute phase of infection. The lowest concentration of SARS-CoV-2 viral copies this assay can detect is 250 copies / mL. A negative result does not preclude SARS-CoV-2 infection and should not be used as the sole basis for treatment or other patient management decisions.  A negative result may occur with improper specimen collection / handling, submission of specimen other than nasopharyngeal swab, presence of viral mutation(s) within the areas targeted by this assay, and inadequate number of viral copies (<250 copies / mL). A negative result must be combined with clinical observations, patient history, and epidemiological information.  Fact Sheet for Patients:   https://www.patel.info/  Fact Sheet for Healthcare Providers: https://hall.com/  This test is not yet approved or  cleared by the [place] FDA and has been authorized for detection and/or diagnosis of SARS-CoV-2 by FDA under an Emergency Use Authorization (EUA).  This EUA will remain in effect (meaning this test can be used) for the duration of the COVID-19 declaration under Section 564(b)(1) of the Act, 21 U.S.C. section 360bbb-3(b)(1), unless the authorization is terminated or revoked sooner.  Performed at [facility], [place], [place] [address]   Respiratory (~20 pathogens) panel by PCR     Status: None   Collection Time: 05/03/22  1:18 AM   Specimen: Nasopharyngeal Swab; Respiratory  Result Value Ref Range Status   Adenovirus NOT DETECTED NOT DETECTED Final   Coronavirus 229E NOT DETECTED NOT DETECTED Final    Comment: (NOTE) The Coronavirus on  the Respiratory Panel, DOES NOT test for the novel  Coronavirus (2019 nCoV)    Coronavirus HKU1 NOT DETECTED NOT DETECTED Final   Coronavirus NL63 NOT DETECTED NOT DETECTED Final   Coronavirus OC43 NOT DETECTED NOT DETECTED Final   Metapneumovirus NOT DETECTED NOT DETECTED Final   Rhinovirus / Enterovirus NOT DETECTED NOT  DETECTED Final   Influenza A NOT DETECTED NOT DETECTED Final   Influenza B NOT DETECTED NOT DETECTED Final   Parainfluenza Virus 1 NOT DETECTED NOT DETECTED Final   Parainfluenza Virus 2 NOT DETECTED NOT DETECTED Final   Parainfluenza Virus 3 NOT DETECTED NOT DETECTED Final   Parainfluenza Virus 4 NOT DETECTED NOT DETECTED Final   Respiratory Syncytial Virus NOT DETECTED NOT DETECTED Final   Bordetella pertussis NOT DETECTED NOT DETECTED Final   Bordetella Parapertussis NOT DETECTED NOT DETECTED Final   Chlamydophila pneumoniae NOT DETECTED NOT DETECTED Final   Mycoplasma pneumoniae NOT DETECTED NOT DETECTED Final    Comment: Performed at [facility], [place] [address], [place], [place] [address]  MRSA Next Gen by PCR, Nasal     Status: None   Collection Time: 05/03/22 11:24 AM   Specimen: Nasal Mucosa; Nasal Swab  Result Value Ref Range Status   MRSA by PCR Next Gen NOT DETECTED NOT DETECTED Final    Comment: (NOTE) The GeneXpert MRSA Assay (FDA approved for NASAL specimens only), is one component of a comprehensive MRSA colonization surveillance program. It is not intended to diagnose MRSA infection nor to guide or monitor treatment for MRSA infections. Test performance is not FDA approved in patients less than 39 years old. Performed at [facility], [address] [place], [place] [address]     Radiology Studies: DG Chest [place] 1 View  Result Date: 05/04/2022 CLINICAL DATA:  Shortness of breath EXAM: PORTABLE CHEST 1 VIEW COMPARISON:  05/02/2022 FINDINGS: Patchy airspace disease in the right upper lobe. Heart is normal size. Aortic  calcifications. No confluent opacity on the left. No effusions or acute bony abnormality. IMPRESSION: Patchy airspace opacity in the right upper lobe concerning for pneumonia. Electronically Signed   By: Rolm Baptise M.D.   On: 05/04/2022 21:01   US Venous Img Lower Bilateral (DVT)  Result Date: 05/04/2022 CLINICAL DATA:  81 year old male with pulmonary embolism in lower extremity swelling. EXAM: BILATERAL LOWER EXTREMITY VENOUS DOPPLER ULTRASOUND TECHNIQUE: Gray-scale sonography with graded compression, as well as color Doppler and duplex ultrasound were performed to evaluate the lower extremity deep venous systems from the level of the common femoral vein and including the common femoral, femoral, profunda femoral, popliteal and calf veins including the posterior tibial, peroneal and gastrocnemius veins when visible. The superficial great saphenous vein was also interrogated. Spectral Doppler was utilized to evaluate flow at rest and with distal augmentation maneuvers in the common femoral, femoral and popliteal veins. COMPARISON:  01/10/2022 FINDINGS: RIGHT LOWER EXTREMITY Common Femoral Vein: Nonocclusive, heterogeneously isoechoic thrombus is present peripherally. The common femoral vein appears patent with normal respiratory variation centrally. Saphenofemoral Junction: Nonocclusive, heterogeneously isoechoic thrombus is present. Profunda Femoral Vein: No evidence of thrombus. Normal compressibility and flow on color Doppler imaging. Femoral Vein: Nonocclusive, heterogeneously isoechoic thrombus is present throughout. Popliteal Vein: No evidence of thrombus. Normal compressibility, respiratory phasicity and response to augmentation. Calf Veins: No evidence of thrombus. Normal compressibility and flow on color Doppler imaging. Other Findings:  None. LEFT LOWER EXTREMITY Common Femoral Vein: No evidence of thrombus. Normal compressibility, respiratory phasicity and response to augmentation. Saphenofemoral  Junction: No evidence of thrombus. Normal compressibility and flow on color Doppler imaging. Profunda Femoral Vein: No evidence of thrombus. Normal compressibility and flow on color Doppler imaging. Femoral Vein: No evidence of thrombus. Normal compressibility, respiratory phasicity and response to augmentation. Popliteal Vein: No evidence of thrombus. Normal compressibility, respiratory phasicity and response to  augmentation. Calf Veins: No evidence of thrombus. Normal compressibility and flow on color Doppler imaging. Other Findings:  None. IMPRESSION: 1. Subacute appearing right lower extremity deep vein thrombosis extending from the peripheral common femoral vein through the peripheral femoral vein. No definite evidence of iliac extension. 2. No evidence of left lower extremity deep vein thrombosis. Ruthann Cancer, MD [facility] Radiology Electronically Signed   By: Ruthann Cancer M.D.   On: 05/04/2022 08:08   CT HEAD WO CONTRAST (5MM)  Result Date: 05/04/2022 CLINICAL DATA:  Altered mental status, unequal pupils EXAM: CT HEAD WITHOUT CONTRAST TECHNIQUE: Contiguous axial images were obtained from the base of the skull through the vertex without intravenous contrast. RADIATION DOSE REDUCTION: This exam was performed according to the departmental dose-optimization program which includes automated exposure control, adjustment of the mA and/or kV according to patient size and/or use of iterative reconstruction technique. COMPARISON:  01/10/2022 FINDINGS: Brain: No evidence of acute infarction, hemorrhage, mass, mass effect, or midline shift. No hydrocephalus or extra-axial fluid collection. Periventricular white matter changes, likely the sequela of chronic small vessel ischemic disease. Vascular: No hyperdense vessel. Atherosclerotic calcifications in the intracranial carotid and vertebral arteries. Skull: Normal. Negative for fracture or focal lesion.  Sinuses/Orbits: No acute finding. Other: Fluid in the right mastoid air cells. IMPRESSION: No acute intracranial process. Electronically Signed   By: Merilyn Baba M.D.   On: 05/04/2022 02:25    Scheduled Meds:  acetylcysteine  4 mL Nebulization BID   apixaban  10 mg Oral BID   Followed by   Derrill Memo ON 05/11/2022] apixaban  5 mg Oral BID   ARIPiprazole  20 mg Oral Daily   ascorbic acid  500 mg Oral BID   buPROPion  300 mg Oral Daily   carvedilol  25 mg Oral BID   Chlorhexidine Gluconate Cloth  6 each Topical Daily   cholecalciferol  1,000 Units Oral q AM   dextromethorphan  5 mL Oral Q12H   ferrous sulfate  325 mg Oral Q breakfast   finasteride  5 mg Oral Daily   fluticasone furoate-vilanterol  1 puff Inhalation Daily   folic acid  1 mg Oral q AM   insulin aspart  0-15 Units Subcutaneous TID WC   ipratropium-albuterol  3 mL Nebulization Q6H   loratadine  10 mg Oral Daily   multivitamin with minerals  1 tablet Oral Daily   mouth rinse  15 mL Mouth Rinse 4 times per day   PHENobarbital  64.8 mg Oral QODAY   potassium chloride  40 mEq Oral Once   potassium chloride  40 mEq Oral Once   pravastatin  10 mg Oral QHS   sodium chloride flush  3 mL Intravenous Q12H   umeclidinium bromide  1 puff Inhalation Daily   Continuous Infusions:  sodium chloride     cefTRIAXone (ROCEPHIN)  IV 1 g (05/05/22 0205)     LOS: 3 days    Time spent: 50 mins    Alynn Ellithorpe, MD [facility]   If 7PM-7AM, please contact night-coverage

## 2022-05-05 NOTE — Progress Notes (Signed)
     PATIENT: [name]         IDP:[id number] DOB: 05-23-1941   Cross Cover Due to strict NPO pharmacy reconsulted to resume heparin therapy for PE/DVT treatment.  Phenobarbital therapy for his seizure control also switched to IV form  Artemis Koller L. Randol Kern NP [facility]

## 2022-05-05 NOTE — Progress Notes (Addendum)
Per change of shift report pt unable to safely takes medicine due to gargling sound.On assessment pt was positive for garling sound. No diet order was place for pt. NP Randol Kern made aware. Will continue to monitor.  Update 2005: NP Randol Kern placed order. Will continue to monitor.  Update 0621: Lab staff caled for a critical potassium level of 2.4. NP Randol Kern, MD Arne Cleveland RN. Will continue to monitor.

## 2022-05-05 NOTE — Progress Notes (Signed)
SLP Cancellation Note  Patient Details Name: [name] MRN: [id number] DOB: 01/22/1941   Cancelled treatment:       Reason Eval/Treat Not Completed: Medical issues which prohibited therapy;Patient not medically ready (chart reviewed; consulted NSG re: pt's status this PM) Pt had a decline in Respiratory status yesterday evening requiring RT intervention; MD made pt NPO at that time d/t concern for aspiration of own secretions.  Consulted NSG re: pt's status currently. Pt continues to have wet, gurgly vocal quality at Rest indicating he is NOT managing his own saliva and secretions. It is not safe to give oral intake at this time d/t HIGH risk for aspiration. Recommend strict NPO status until he is able to clear/manage native pharyngo-laryngeal secretions. ST recommends continued NPO status including medications and will f/u tomorrow w/ his status; po trials IF appropriate. Have recommended Palliative Care consult for discussion of [place] in setting of Baseline Dysphagia (01/2022) requiring a Honey consistency liquid diet; and for discussion of Code status Recommend frequent oral care for hygiene and stimulation of swallowing.      Orinda Kenner, MS, CCC-SLP Speech Language Pathologist Rehab Services; [place] [phone] ([email or web address]) Allin Frix 05/05/2022, 2:14 PM

## 2022-05-05 NOTE — TOC Progression Note (Signed)
Transition of Care [facility]) - Progression Note    Patient Details  Name: KOBIE WHIDBY MRN: [id number] Date of Birth: 03-05-1941  Transition of Care [facility]) CM/SW [place], LCSW Phone Number: 05/05/2022, 10:52 AM  Clinical Narrative:   Reviewed chart looking for family contact information and was unable to locate any. Per TOC note from 6/9: "Call from [facility] (owner at [facility] he previously lived at) who stated patient doesn't have a friend/family contact and is his own representative."   Expected Discharge Plan: Skilled Nursing Facility Barriers to Discharge: Continued Medical Work up  Expected Discharge Plan and Services Expected Discharge Plan: [place] Acute Care Choice: Resumption of Svcs/PTA Provider Living arrangements for the past 2 months: [place]                                       Social Determinants of Health (SDOH) Interventions    Readmission Risk Interventions    05/04/2022   12:15 PM  Readmission Risk Prevention Plan  Transportation Screening Complete  Medication Review (RN Care Manager) Complete  PCP or Specialist appointment within 3-5 days of discharge Complete  SW Recovery Care/Counseling Consult Complete  [place] Complete

## 2022-05-05 NOTE — Consult Note (Signed)
PHARMACY CONSULT NOTE - FOLLOW UP  Pharmacy Consult for Electrolyte Monitoring and Replacement   Recent Labs: Potassium (mmol/L)  Date Value  05/05/2022 2.4 (LL)  10/03/2012 3.1 (L)   Magnesium (mg/dL)  Date Value  05/04/2022 2.1  10/02/2012 1.3 (L)   Calcium (mg/dL)  Date Value  05/05/2022 8.7 (L)   Calcium, Total (mg/dL)  Date Value  10/03/2012 8.2 (L)   Albumin (g/dL)  Date Value  05/02/2022 3.3 (L)  10/02/2012 3.4   Sodium (mmol/L)  Date Value  05/05/2022 143  10/03/2012 141    Assessment: Potassium dropped from 2.5 >> 2.4 today despite supplementation with 100 mEq yesterday and overnight (60 mEq IV + 40 mEq PO). Mg level above goal yesterday but lab still in process from today's draw.  Goal of Therapy:  K >4.0 and Mg >2.0 due to PMH CHF  Plan:  Give potassium chloride IV 10 mEq x 4 along with PO 40 mEq x 1 Recheck K+ one hour after completion of infusions to see if additional supplementation necessary F/U Mg level which is pending and supplement as necessary  Dara Hoyer, PharmD PGY-1 Pharmacy Resident 05/05/2022 7:59 AM

## 2022-05-06 ENCOUNTER — Inpatient Hospital Stay: Payer: Medicare Other

## 2022-05-06 DIAGNOSIS — Z515 Encounter for palliative care: Secondary | ICD-10-CM | POA: Diagnosis not present

## 2022-05-06 DIAGNOSIS — R1310 Dysphagia, unspecified: Secondary | ICD-10-CM

## 2022-05-06 DIAGNOSIS — Z7189 Other specified counseling: Secondary | ICD-10-CM | POA: Diagnosis not present

## 2022-05-06 DIAGNOSIS — I2699 Other pulmonary embolism without acute cor pulmonale: Secondary | ICD-10-CM | POA: Diagnosis not present

## 2022-05-06 LAB — BASIC METABOLIC PANEL
Anion gap: 8 (ref 5–15)
BUN: 49 mg/dL — ABNORMAL HIGH (ref 8–23)
CO2: 22 mmol/L (ref 22–32)
Calcium: 8.5 mg/dL — ABNORMAL LOW (ref 8.9–10.3)
Chloride: 113 mmol/L — ABNORMAL HIGH (ref 98–111)
Creatinine, Ser: 2.1 mg/dL — ABNORMAL HIGH (ref 0.61–1.24)
GFR, Estimated: 31 mL/min — ABNORMAL LOW (ref >60)
Glucose, Bld: 127 mg/dL — ABNORMAL HIGH (ref 70–99)
Potassium: 2.4 mmol/L — CL (ref 3.5–5.1)
Sodium: 143 mmol/L (ref 135–145)

## 2022-05-06 LAB — APTT
aPTT: 82 seconds — ABNORMAL HIGH (ref 24–36)
aPTT: 62 seconds — ABNORMAL HIGH (ref 24–36)

## 2022-05-06 LAB — CBC
HCT: 37.1 % — ABNORMAL LOW (ref 39.0–52.0)
Hemoglobin: 12 g/dL — ABNORMAL LOW (ref 13.0–17.0)
MCH: 27.8 pg (ref 26.0–34.0)
MCHC: 32.3 g/dL (ref 30.0–36.0)
MCV: 85.9 fL (ref 80.0–100.0)
Platelets: 273 10*3/uL (ref 150–400)
RBC: 4.32 MIL/uL (ref 4.22–5.81)
RDW: 15.6 % — ABNORMAL HIGH (ref 11.5–15.5)
WBC: 8.4 10*3/uL (ref 4.0–10.5)
nRBC: 0 % (ref 0.0–0.2)

## 2022-05-06 LAB — POTASSIUM: Potassium: 2.7 mmol/L — CL (ref 3.5–5.1)

## 2022-05-06 LAB — GLUCOSE, CAPILLARY
Glucose-Capillary: 126 mg/dL — ABNORMAL HIGH (ref 70–99)
Glucose-Capillary: 106 mg/dL — ABNORMAL HIGH (ref 70–99)
Glucose-Capillary: 134 mg/dL — ABNORMAL HIGH (ref 70–99)

## 2022-05-06 LAB — HEPARIN LEVEL (UNFRACTIONATED): Heparin Unfractionated: >1.1 IU/mL — ABNORMAL HIGH (ref 0.30–0.70)

## 2022-05-06 LAB — MAGNESIUM: Magnesium: 2.3 mg/dL (ref 1.7–2.4)

## 2022-05-06 LAB — PROCALCITONIN: Procalcitonin: <0.1 ng/mL

## 2022-05-06 LAB — PHOSPHORUS: Phosphorus: 3.3 mg/dL (ref 2.5–4.6)

## 2022-05-06 MED ORDER — MORPHINE SULFATE (PF) 2 MG/ML IV SOLN
0.5000 mg | Freq: Once | INTRAVENOUS | Status: AC
  Administered 2022-05-06: 0.5 mg via INTRAVENOUS
  Filled 2022-05-06: qty 1

## 2022-05-06 MED ORDER — POTASSIUM CHLORIDE 10 MEQ/100ML IV SOLN
10.0000 meq | INTRAVENOUS | Status: AC
  Administered 2022-05-06 (×6): 10 meq via INTRAVENOUS
  Filled 2022-05-06 (×6): qty 100

## 2022-05-06 MED ORDER — SODIUM CHLORIDE 0.9 % IV SOLN: INTRAVENOUS | Status: DC

## 2022-05-06 MED ORDER — MORPHINE SULFATE (PF) 2 MG/ML IV SOLN
1.0000 mg | Freq: Four times a day (QID) | INTRAVENOUS | Status: DC | PRN
  Administered 2022-05-06 – 2022-05-13 (×6): 1 mg via INTRAVENOUS
  Filled 2022-05-06 (×7): qty 1

## 2022-05-06 MED ORDER — HEPARIN BOLUS VIA INFUSION
1350.0000 [IU] | Freq: Once | INTRAVENOUS | Status: AC
  Administered 2022-05-06: 1350 [IU] via INTRAVENOUS
  Filled 2022-05-06: qty 1350

## 2022-05-06 MED ORDER — POTASSIUM CHLORIDE 10 MEQ/100ML IV SOLN
10.0000 meq | INTRAVENOUS | Status: AC
  Administered 2022-05-06 – 2022-05-07 (×4): 10 meq via INTRAVENOUS
  Filled 2022-05-06 (×4): qty 100

## 2022-05-06 NOTE — Progress Notes (Addendum)
Potassium 2.7. MD notified. Ordered 4 runs of potassium.

## 2022-05-06 NOTE — Progress Notes (Signed)
1530: Patient screaming out "help me". Repositioned patient multiple times. Notified MD. Order for .5mg  morphine received.  1425: Patient still screaming out. Notified MD. Order 1mg  Q6 PRN morphine.

## 2022-05-06 NOTE — Consult Note (Signed)
Consultation Note Date: 05/06/2022   Patient Name: [name]  DOB: 1941-05-30  MRN: [id number]  Age / Sex: 81 y.o., male  PCP: [name] Koyanagi, MD Referring Physician: Shawna Clamp, MD  Reason for Consultation: Establishing goals of care  HPI/Patient Profile: 81 y.o. male  with past medical history of HTN, HLD, GERD, COPD, CHF (diastolic), seizure disorder, and BPH  admitted on 05/02/2022 with respiratory distress.  Patient found to have acute PE and lower extremity DVT.  Also found to have UTI.  Patient found to be high risk for aspiration and n.p.o. status recommended.  Patient with difficulty managing secretions.  PMT consulted to discuss potential alternate means of feeding.  Clinical Assessment and Goals of Care: I have reviewed medical records including EPIC notes, labs and imaging, received report from SLP, assessed the patient and then met with patient to discuss diagnosis prognosis, GOC, EOL wishes, disposition and options.  Per nursing documentation yesterday and today patient with fluctuations in mental status and disorientation.  I introduced Palliative Medicine as specialized medical care for people living with serious illness. It focuses on providing relief from the symptoms and stress of a serious illness. The goal is to improve quality of life for both the patient and the family.  I asked patient orientation questions and he could tell me his name and where he was however he could not explain the situation.  He cannot tell me about the concerns with his swallowing.  When I attempted to explain his aspiration risk he tells me he swallows well.  Throughout conversation patient fixates on wanting to get out of bed.  I attempted to discuss with him concern that he may need alternative means for nutrition and explained what a feeding tube was.  Initially he tells me "that will be fine" but then later as I am talking about a feeding  tube again he tells me he would not want that.  Continuously repeats he wants to get out of bed.  I do not feel patient currently has a capacity to make this medical decision as he is unable to understand risks and benefits of this decision.  The capacity to make one's own decisions is fundamental to the ethical principal of respect for autonomy. The main determinant of capacity is cognition. Capacity describes a person's ability to make a decision. Capacity refers to the ability to utilize information about an illness and proposed treatment options to make a choice that is congruent with one's own values and preferences.  There are four generally accepted decision-making abilities that constitute capacity: Understanding, expressing a choice, appreciation and reasoning. Understanding is to know the meaning of the information given. Individuals should be able to clearly communicate a choice when presented with multiple treatment options.  Knowing facts essential to make a decision, but applying those facts to one's own life is essential to make the decision authentic. The ability to appreciate facts refers to one's ability to recognize how facts are relevant to oneself, and how it affects one now and could affect one in the future shows that one appreciates one's diagnosis, prognosis and treatment options. The ability to reason is the ability to compare options and to infer consequences of choice.  Today [name] was unable to tell me in his own words any information regarding his current diagnoses and its relation to this hospital stay.  He could not reason the "what if's" if he did or did not pursue artificial nutrition.  Patient has no surrogate  decision makers listed in chart and per [facility] note this facility has also reported they do not have friend or family contacts.  Primary Decision Maker OTHER -unclear    SUMMARY OF RECOMMENDATIONS   -At the time of my discussion with the patient it did  not seem that he had the capacity to make the decision regarding artificial nutrition -Currently no surrogate decision makers have been identified -PMT is unable to move forward with goals of care discussions at this time     Primary Diagnoses: Present on Admission:  COPD exacerbation ([place])  UTI (urinary tract infection)  Chronic diastolic CHF (congestive heart failure) (HCC)  Hypertension  Mixed hyperlipidemia  Depression  BPH (benign prostatic hyperplasia)  Normocytic anemia  Hypokalemia  DVT of lower extremity, bilateral ([place])  Venous insufficiency of both lower extremities  Acute pulmonary embolism ([place])   I have reviewed the medical record, interviewed the patient and family, and examined the patient. The following aspects are pertinent.  Past Medical History:  Diagnosis Date   Bipolar 1 disorder ([place])    C. difficile diarrhea 05/2016   history of ...   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   Schizophrenia ([place])    Seizure (HCC)    on phenobarb   Urinary retention    frequently requiring foley placement   VRE (vancomycin resistant enterococcus) culture positive    urine   Social History   Socioeconomic History   Marital status: Single    Spouse name: Not on file   Number of children: Not on file   Years of education: Not on file   Highest education level: Not on file  Occupational History   Not on file  Tobacco Use   Smoking status: Never    Passive exposure: Never   Smokeless tobacco: Never  Vaping Use   Vaping Use: Never used  Substance and Sexual Activity   Alcohol use: No   Drug use: No   Sexual activity:  Never  Other Topics Concern   Not on file  Social History Narrative   Not on file   Social Determinants of Health   Financial Resource Strain: Not on file  Food Insecurity: Not on file  Transportation Needs: Not on file  Physical Activity: Not on file  Stress: Not on file  Social Connections: Not on file   Family History  Problem Relation Age of Onset   Cancer Mother    Prostate cancer Neg Hx    Kidney disease Neg Hx    Kidney cancer Neg Hx    Bladder Cancer Neg Hx    Scheduled Meds:  acetylcysteine  4 mL Nebulization BID   ARIPiprazole  20 mg Oral Daily   ascorbic acid  500 mg Oral BID   buPROPion  300 mg Oral Daily   carvedilol  25 mg Oral BID   Chlorhexidine Gluconate Cloth  6 each Topical Daily   cholecalciferol  1,000 Units Oral q AM   dextromethorphan  5 mL Oral Q12H   ferrous sulfate  325 mg Oral Q breakfast   finasteride  5  mg Oral Daily   fluticasone furoate-vilanterol  1 puff Inhalation Daily   folic acid  1 mg Oral q AM   heparin  1,350 Units Intravenous Once   insulin aspart  0-15 Units Subcutaneous TID WC   ipratropium-albuterol  3 mL Nebulization Q6H   loratadine  10 mg Oral Daily   multivitamin with minerals  1 tablet Oral Daily   mouth rinse  15 mL Mouth Rinse 4 times per day   PHENObarbital  65 mg Intravenous QODAY   pravastatin  10 mg Oral QHS   sodium chloride flush  3 mL Intravenous Q12H   umeclidinium bromide  1 puff Inhalation Daily   Continuous Infusions:  sodium chloride     cefTRIAXone (ROCEPHIN)  IV 1 g (05/06/22 0318)   heparin 1,500 Units/hr (05/06/22 1153)   potassium chloride 10 mEq (05/06/22 1346)   PRN Meds:.sodium chloride, acetaminophen **OR** acetaminophen, albuterol, guaiFENesin, HYDROcodone-acetaminophen, mouth rinse, mouth rinse, polyethylene glycol, sodium chloride flush Allergies  Allergen Reactions   Penicillin G Rash and Other (See Comments)    Convulsions.    Has patient had a PCN reaction causing immediate rash,  facial/tongue/throat swelling, SOB or lightheadedness with hypotension: not sure Has patient had a PCN reaction causing severe rash involving mucus membranes or skin necrosis: not sure Has patient had a PCN reaction that required hospitalization: not sure Has patient had a PCN reaction occurring within the last 10 years: not sure If all of the above answers are "NO", then may proceed with Cephalosporin use.   Review of Systems  Unable to perform ROS: Mental status change    Physical Exam Constitutional:      General: He is not in acute distress.    Appearance: He is ill-appearing.  Pulmonary:     Effort: Pulmonary effort is normal.  Skin:    General: Skin is warm and dry.  Neurological:     Mental Status: He is alert.     Comments: Oriented to self and place, not oriented to time or situation     Vital Signs: BP 131/88 (BP Location: Right Arm)   Pulse 95   Temp 98.1 F (36.7 C)   Resp 16   Ht $R'5\' 8"'wj$  (1.727 m)   Wt 95 kg   SpO2 94%   BMI 31.84 kg/m  Pain Scale: 0-10   Pain Score: 0-No pain   SpO2: SpO2: 94 % O2 Device:SpO2: 94 % O2 Flow Rate: .O2 Flow Rate (L/min):  (room air)  IO: Intake/output summary:  Intake/Output Summary (Last 24 hours) at 05/06/2022 1416 Last data filed at 05/06/2022 0700 Gross per 24 hour  Intake 127.8 ml  Output 495 ml  Net -367.2 ml    LBM: Last BM Date : 05/06/22 Baseline Weight: Weight: 102.5 kg Most recent weight: Weight: 95 kg     Palliative Assessment/Data: PPS 40%     *Please note that this is a verbal dictation therefore any spelling or grammatical errors are due to the "[place]" system interpretation.   Juel Burrow, DNP, AGNP-C Palliative Medicine Team [phone] Pager: [phone]

## 2022-05-06 NOTE — Consult Note (Signed)
ANTICOAGULATION CONSULT NOTE - Initial Consult  Pharmacy Consult for heparin infusion Indication: pulmonary embolus  Allergies  Allergen Reactions   Penicillin G Rash and Other (See Comments)    Convulsions.    Has patient had a PCN reaction causing immediate rash, facial/tongue/throat swelling, SOB or lightheadedness with hypotension: not sure Has patient had a PCN reaction causing severe rash involving mucus membranes or skin necrosis: not sure Has patient had a PCN reaction that required hospitalization: not sure Has patient had a PCN reaction occurring within the last 10 years: not sure If all of the above answers are "NO", then may proceed with Cephalosporin use.    Patient Measurements: Height: 5\' 8"  (172.7 cm) Weight: 95 kg (209 lb 7 oz) IBW/kg (Calculated) : 68.4 Heparin Dosing Weight: 88.3 kg   Vital Signs: Temp: 98.1 F (36.7 C) (09/28 1238) Temp Source: Oral (09/28 0412) BP: 131/88 (09/28 1238) Pulse Rate: 95 (09/28 1238)  Labs: Recent Labs    05/03/22 1815 05/04/22 0448 05/04/22 0448 05/05/22 05/07/22 05/06/22 0453 05/06/22 1310  HGB  --  11.7*   < > 12.0* 12.0*  --   HCT  --  35.3*  --  37.1* 37.1*  --   PLT  --  270  --  292 273  --   APTT  --   --   --   --  82* 62*  HEPARINUNFRC 0.69 0.46  --   --  >1.10*  --   CREATININE  --  1.57*  --  2.03* 2.10*  --    < > = values in this interval not displayed.     Estimated Creatinine Clearance: 31.9 mL/min (A) (by C-G formula based on SCr of 2.1 mg/dL (H)).   Medical History: Past Medical History:  Diagnosis Date   Bipolar 1 disorder (HCC)    C. difficile diarrhea 05/2016   history of ...   CHF (congestive heart failure) (HCC)    Chronic kidney disease 05/21/2016   acute renal failure with sepsis and uti   COPD (chronic obstructive pulmonary disease) (HCC)    DVT (deep venous thrombosis) (HCC)    GERD (gastroesophageal reflux disease)    Hematuria    High cholesterol    History of kidney stones     Hypertension    Penile erosion 2018   d/t frequent foley catheters   Schizophrenia (HCC)    Seizure (HCC)    on phenobarb   Urinary retention    frequently requiring foley placement   VRE (vancomycin resistant enterococcus) culture positive    urine    Medications:  Apixaban 10 mg x 1 dose 9/26 @ 1039  Assessment: 81 y.o. male with medical history significant of HTN, HLD, GERD, COPD, CHF (diastolic), seizure disorder presented to ED 05/02/22 with hypoxia and wheezing. CT chest showing PE. Patient initially started on oral therapy with apixaban and now strict NPO because of significant risk of aspiration. Pharmacy consulted to transition to IV heparin while patient NPO. Last dose of apixaban was 9/26 @ 1039. Hgb, Hct, PLT stable.  Goal of Therapy:  Heparin level 0.3-0.7 units/ml aPTT 66-102 seconds Monitor platelets by anticoagulation protocol: Yes  Date Time aPTT/HL Rate/Comment 9/28 0453 aPTT 82 Therapeutic x 1; 1500 un/hr 9/28 0453 HL >1.10 HL and aPTT not correlating, so still titrate by aPTT 9/28 1335 aPTT 62 Subtherapeutic; 1500 >> 1650 un/hr     Baseline Labs: aPTT - 30; INR - 1.2 Hgb - 13.7; Plts - 273  Plan: Bolus heparin 1350 units x 1 Increase heparin infusion rate from 1500 >> 1650 un/hr Check aPTT in 8 hours and daily once consecutively therapeutic. Check HL with AM labs Continue to titrate heparin by aPTT until aPTT and HL correlate Continue to monitor CBC daily while on heparin gtt.  Dara Hoyer, PharmD PGY-1 Pharmacy Resident 05/06/2022 3:46 PM

## 2022-05-06 NOTE — Progress Notes (Signed)
Nutrition Follow-up  DOCUMENTATION CODES:   Non-severe (moderate) malnutrition in context of chronic illness  INTERVENTION:   -If pt able to advance diet, resume:   Honey Thick Mighty Shake po TID, each supplement provides 200kcal and 7g protein    Magic cup TID with meals, each supplement provides 290 kcal and 9 grams of protein   MVI po daily    Vitamin C 500mg  po BID  -If nutrition support is warranted and within pt's goals of care, consider:  Initiate Osmolite 1.5 @ 20 ml/hr and increase by 10 ml every 12 hours to goal rate of 50 ml/hr.   60 ml Prosource TF 20 daily.    165 ml free water flush every 4 hours  Tube feeding regimen provides 1880 kcal (100% of needs), 95 grams of protein, and 914 ml of H2O.  Total free water: 1904 ,l daily  NUTRITION DIAGNOSIS:   Moderate Malnutrition related to chronic illness as evidenced by mild fat depletion, mild muscle depletion, percent weight loss.  Ongoing  GOAL:   Patient will meet greater than or equal to 90% of their needs  Unmet  MONITOR:   PO intake, Supplement acceptance, Labs, Weight trends, Skin, I & O's  REASON FOR ASSESSMENT:   Malnutrition Screening Tool    ASSESSMENT:   81 y/o male with h/o HTN, HLD, GERD, COPD, CHF, seizures, BPH, depression and DVT who is admitted with UTI and acute PE.  9/25- s/p BSE- advanced to dysphagia 3 diet with honey thick liquids 9/27- s/p BSE- now NPO secondary to we, gurgly vocal quality at rest  Reviewed I/O's: -367 ml x 24 hours and -733 ml since admission  UOP: 495 ml x 24 hours   Per [facility] notes, pt with unstageable with deep tissue pressure injury to lt lateral heel and stage 2 pressure injuries to bilateral buttocks.   Pt receiving x-ray at time of visit.   Pt currently NPO due to high risk of aspiration. Plan for SLP follow-up today to assess ability for PO trials. Pt will likely chronically require honey thick liquids. Palliative care consult pending for goals  of care discussions. If pt unable to swallow safely and aggressive care is desired, pt will require initiation of nutrition support, however, RD does not recommend this. NGT will provide only temporary means of alternative nutrition and hydration and pt will not be able to return to SNF with NGT. Permanent feeding access (ex PEG) will not decrease aspiration risk and will not improve pt's overall quality of life secondary to dementia.   Per TOC notes, pt is from [facility] and plans to return on discharge.   Medications reviewed and include vitamin C, vitamin D3, ferrous sulfate, folic acid, and potassium chloride.   Labs reviewed: K: 2.4 (on supplementation), CBGS: 117-126 (inpatient orders for glycemic control are 0-15 units insulin aspart TID with meals).    Diet Order:   Diet Order             Diet NPO time specified  Diet effective now                   EDUCATION NEEDS:   Education needs have been addressed  Skin:  Skin Assessment: Skin Integrity Issues: Skin Integrity Issues:: Stage II, Unstageable Stage II: rt and lt buttocks Unstageable: DPTI to lt lower heel  Last BM:  05/04/22  Height:   Ht Readings from Last 1 Encounters:  05/03/22 5\' 8"  (1.727 m)    Weight:  Wt Readings from Last 1 Encounters:  05/03/22 95 kg    Ideal Body Weight:  70 kg  BMI:  Body mass index is 31.84 kg/m.  Estimated Nutritional Needs:   Kcal:  1900-2200kcal/day  Protein:  95-110g/day  Fluid:  1.8-2.1L/day    Levada Schilling, RD, LDN, CDCES Registered Dietitian II Certified Diabetes Care and Education Specialist Please refer to AMION for RD and/or RD on-call/weekend/after hours pager

## 2022-05-06 NOTE — Progress Notes (Addendum)
PROGRESS NOTE    [name]  QQI:[id number] DOB: July 01, 1941 DOA: 05/02/2022 PCP: Rosetta Posner, MD   Brief Narrative:  This 81 y.o. male with medical history significant of HTN, HLD, GERD, COPD, CHF (diastolic), seizure disorder, BPH who comes in ED with presumed COPD exacerbation.  Per the EDP he was wheezing on admission and hypoxic.  EMS had to put him on CPAP to get his oxygen saturations up.  His blood gases were essentially normal.  He was weaned down to nasal cannula.  His chest x-ray was negative.  COVID was also negative. CT chest with contrast Filling defects within right lower lobe pulmonary arterial branches compatible with pulmonary emboli. Associated right basilar airspace opacity could reflect atelectasis or infarcts.Elevation of the right hemidiaphragm.  Patient is admitted for acute pulmonary embolism, started on IV heparin.  Patient has significant risk of aspiration.  Speech and swallow recommended n.p.o. with alternate means of feeding.  Palliative care consulted to discuss goals of care.   Assessment & Plan:   Principal Problem:   Acute pulmonary embolism (HCC) Active Problems:   UTI (urinary tract infection)   Chronic diastolic CHF (congestive heart failure) (HCC)   Seizure disorder (HCC)   Depression   Hypertension   Mixed hyperlipidemia   BPH (benign prostatic hyperplasia)   Normocytic anemia   Hypokalemia   DVT of lower extremity, bilateral (HCC)   Venous insufficiency of both lower extremities   COPD exacerbation (HCC)   Malnutrition of moderate degree   Acute deep vein thrombosis (DVT) of left lower extremity (HCC)   Acute respiratory failure with hypoxia (HCC)  Acute pulmonary embolism / Acute Right LE DVT:  Patient found to have acute pulmonary embolism and acute Right LE DVT He was started on heparin gtt. Likely primary cause for his respiratory distress and oxygen requirements. Continue supplemental oxygen as needed. He was successfully  transitioned to Eliquis, put back on heparin IV due to n.p.o. and high aspiration risk.  UTI in the setting of chronic suprapubic catheter: Patient has indwelling suprapubic catheter and urine had a lot of sediment in it. Suprapubic catheter was changed  (05/03/22) It was changed on August 25th 2023 at urology office Remains afebrile without leukocytosis.   Started on antibiotic for possible aspiration pneumonia.  Aspiration pneumonia: Patient is started on IV antibiotics.   Chest x-ray shows improving infiltrate. Continue to monitor clinical status   Chronic diastolic CHF: Continue ACE inhibitor. Continue carvedilol Continue Lasix as needed BNP is slightly elevated but chest x-ray does not show fluid overload.   Seizure disorder (HCC) Continue phenobarbital.   Depression Continue Wellbutrin, Abilify.  Hypokalemia: Replacement in progress.  Continue to monitor   Hypertension Continue lisinopril, carvedilol.   Mixed hyperlipidemia Continue Pravachol.   BPH (benign prostatic hyperplasia) Continue Proscar.   Normocytic anemia Hemoglobin is stable.   Hypokalemia Replaced.  Continue to monitor   COPD: Currently no wheezing. Continue inhalers.   Venous insufficiency of both lower extremities Continue Lasix as needed   Dysphagia with high aspiration risk: Speech and swallow evaluation completed, high risk for aspiration recommended NPO. Patient was empirically started on ceftriaxone for aspiration pneumonia. Palliative care consulted to discuss goals of care, alternate means of feeding. Patient has no family member.  Obesity: Estimated body mass index is 31.84 kg/m as calculated from the following:   Height as of this encounter: 5\' 8"  (1.727 m).   Weight as of this encounter: 95 kg.      DVT prophylaxis:  Eliquis Code Status: Full code Family Communication: No family at bedside Disposition Plan:   Status is: Inpatient Remains inpatient appropriate  because: Admitted for acute PE, acute right LE DVT started on Eliquis.  Patient has dysphagia with high aspiration risk,  Palliative care consulted to discuss goals of care, alternate means of feeding.    Consultants:  Palliative care  Procedures: None Antimicrobials:  Anti-infectives (From admission, onward)    Start     Dose/Rate Route Frequency Ordered Stop   05/05/22 0215  cefTRIAXone (ROCEPHIN) 1 g in sodium chloride 0.9 % 100 mL IVPB        1 g 200 mL/hr over 30 Minutes Intravenous Every 24 hours 05/05/22 0118 05/10/22 0214       Subjective: Patient was seen and examined at bedside.  Overnight events noted.   Patient was empirically started on ceftriaxone and Mucomyst nebulization due to coughing episode. Patient is at high risk for aspiration.  He remains on antibiotics.  Objective: Vitals:   05/06/22 0750 05/06/22 0840 05/06/22 1238 05/06/22 1334  BP: (!) 134/93 (!) 140/88 131/88   Pulse: (!) 106 99 95   Resp: 12 20 16    Temp:  98.2 F (36.8 C) 98.1 F (36.7 C)   TempSrc:      SpO2: 96% 96% 95% 94%  Weight:      Height:        Intake/Output Summary (Last 24 hours) at 05/06/2022 1423 Last data filed at 05/06/2022 0700 Gross per 24 hour  Intake 127.8 ml  Output 495 ml  Net -367.2 ml   Filed Weights   05/02/22 2102 05/03/22 0842  Weight: 102.5 kg 95 kg    Examination:  General exam: Appears comfortable, not in any acute distress.  Deconditioned Respiratory system: Decreased breath sounds, respiratory effort normal.  RR 15 Cardiovascular system: S1 & S2 heard, regular rate and rhythm, no murmur.   Gastrointestinal system: Abdomen is soft, non tender, non distended, BS+ Central nervous system: Alert and oriented X 1 . No focal neurological deficits. Extremities: No edema, no cyanosis, no clubbing. Skin: No rashes, lesions or ulcers Psychiatry: Mood & affect appropriate.     Data Reviewed: I have personally reviewed following labs and imaging  studies  CBC: Recent Labs  Lab 05/02/22 2121 05/03/22 0406 05/04/22 0448 05/05/22 0613 05/06/22 0453  WBC 8.1 6.4 9.6 10.4 8.4  NEUTROABS 6.3  --   --   --   --   HGB 13.7 12.1* 11.7* 12.0* 12.0*  HCT 43.1 37.7* 35.3* 37.1* 37.1*  MCV 86.7 86.3 84.7 84.7 85.9  PLT 273 231 270 292 273   Basic Metabolic Panel: Recent Labs  Lab 05/02/22 2120 05/02/22 2121 05/02/22 2121 05/03/22 0406 05/04/22 0448 05/04/22 1329 05/05/22 0613 05/05/22 1343 05/05/22 2001 05/06/22 0453  NA  --  144  --  143 142  --  143  --   --  143  K  --  2.9*   < > 2.6* 2.3* 2.5* 2.4* 2.6* 2.8* 2.4*  CL  --  107  --  110 111  --  114*  --   --  113*  CO2  --  27  --  24 24  --  23  --   --  22  GLUCOSE  --  143*  --  148* 141*  --  173*  --   --  127*  BUN  --  14  --  18 31*  --  40*  --   --  49*  CREATININE  --  1.19  --  1.29* 1.57*  --  2.03*  --   --  2.10*  CALCIUM  --  8.7*  --  8.5* 8.6*  --  8.7*  --   --  8.5*  MG 2.2  --   --   --  2.1  --  2.5*  --   --  2.3  PHOS  --   --   --   --   --   --   --   --   --  3.3   < > = values in this interval not displayed.   GFR: Estimated Creatinine Clearance: 31.9 mL/min (A) (by C-G formula based on SCr of 2.1 mg/dL (H)). Liver Function Tests: Recent Labs  Lab 05/02/22 2121  AST 16  ALT 10  ALKPHOS 100  BILITOT 0.9  PROT 8.1  ALBUMIN 3.3*   No results for input(s): "LIPASE", "AMYLASE" in the last 168 hours. No results for input(s): "AMMONIA" in the last 168 hours. Coagulation Profile: Recent Labs  Lab 05/02/22 2121  INR 1.2   Cardiac Enzymes: No results for input(s): "CKTOTAL", "CKMB", "CKMBINDEX", "TROPONINI" in the last 168 hours. BNP (last 3 results) No results for input(s): "PROBNP" in the last 8760 hours. HbA1C: No results for input(s): "HGBA1C" in the last 72 hours.  CBG: Recent Labs  Lab 05/05/22 1133 05/05/22 1621 05/05/22 2044 05/06/22 0842 05/06/22 1238  GLUCAP 105* 142* 117* 126* 106*   Lipid Profile: No results  for input(s): "CHOL", "HDL", "LDLCALC", "TRIG", "CHOLHDL", "LDLDIRECT" in the last 72 hours. Thyroid Function Tests: No results for input(s): "TSH", "T4TOTAL", "FREET4", "T3FREE", "THYROIDAB" in the last 72 hours.  Anemia Panel: No results for input(s): "VITAMINB12", "FOLATE", "FERRITIN", "TIBC", "IRON", "RETICCTPCT" in the last 72 hours. Sepsis Labs: Recent Labs  Lab 05/02/22 2121 05/02/22 2319 05/06/22 0448  PROCALCITON  --   --  <0.10  LATICACIDVEN 1.5 0.4*  --     Recent Results (from the past 240 hour(s))  SARS Coronavirus 2 by RT PCR (hospital order, performed in [facility] hospital lab) *cepheid single result test* Anterior Nasal Swab     Status: None   Collection Time: 05/02/22  9:21 PM   Specimen: Anterior Nasal Swab  Result Value Ref Range Status   SARS Coronavirus 2 by RT PCR NEGATIVE NEGATIVE Final    Comment: (NOTE) SARS-CoV-2 target nucleic acids are NOT DETECTED.  The SARS-CoV-2 RNA is generally detectable in upper and lower respiratory specimens during the acute phase of infection. The lowest concentration of SARS-CoV-2 viral copies this assay can detect is 250 copies / mL. A negative result does not preclude SARS-CoV-2 infection and should not be used as the sole basis for treatment or other patient management decisions.  A negative result may occur with improper specimen collection / handling, submission of specimen other than nasopharyngeal swab, presence of viral mutation(s) within the areas targeted by this assay, and inadequate number of viral copies (<250 copies / mL). A negative result must be combined with clinical observations, patient history, and epidemiological information.  Fact Sheet for Patients:   RoadLapTop.co.za  Fact Sheet for Healthcare Providers: http://kim-miller.com/  This test is not yet approved or  cleared by the [place] FDA and has been authorized for detection and/or diagnosis  of SARS-CoV-2 by FDA under an Emergency Use Authorization (EUA).  This EUA will remain in effect (meaning this test can be used) for the duration of the COVID-19  declaration under Section 564(b)(1) of the Act, 21 U.S.C. section 360bbb-3(b)(1), unless the authorization is terminated or revoked sooner.  Performed at [facility], [place], [place] [address]   Respiratory (~20 pathogens) panel by PCR     Status: None   Collection Time: 05/03/22  1:18 AM   Specimen: Nasopharyngeal Swab; Respiratory  Result Value Ref Range Status   Adenovirus NOT DETECTED NOT DETECTED Final   Coronavirus 229E NOT DETECTED NOT DETECTED Final    Comment: (NOTE) The Coronavirus on the Respiratory Panel, DOES NOT test for the novel  Coronavirus (2019 nCoV)    Coronavirus HKU1 NOT DETECTED NOT DETECTED Final   Coronavirus NL63 NOT DETECTED NOT DETECTED Final   Coronavirus OC43 NOT DETECTED NOT DETECTED Final   Metapneumovirus NOT DETECTED NOT DETECTED Final   Rhinovirus / Enterovirus NOT DETECTED NOT DETECTED Final   Influenza A NOT DETECTED NOT DETECTED Final   Influenza B NOT DETECTED NOT DETECTED Final   Parainfluenza Virus 1 NOT DETECTED NOT DETECTED Final   Parainfluenza Virus 2 NOT DETECTED NOT DETECTED Final   Parainfluenza Virus 3 NOT DETECTED NOT DETECTED Final   Parainfluenza Virus 4 NOT DETECTED NOT DETECTED Final   Respiratory Syncytial Virus NOT DETECTED NOT DETECTED Final   Bordetella pertussis NOT DETECTED NOT DETECTED Final   Bordetella Parapertussis NOT DETECTED NOT DETECTED Final   Chlamydophila pneumoniae NOT DETECTED NOT DETECTED Final   Mycoplasma pneumoniae NOT DETECTED NOT DETECTED Final    Comment: Performed at [facility], [place]. [address], [place], [place] [address]  MRSA Next Gen by PCR, Nasal     Status: None   Collection Time: 05/03/22 11:24 AM   Specimen: Nasal Mucosa; Nasal Swab  Result Value Ref Range Status   MRSA by PCR Next Gen NOT  DETECTED NOT DETECTED Final    Comment: (NOTE) The GeneXpert MRSA Assay (FDA approved for NASAL specimens only), is one component of a comprehensive MRSA colonization surveillance program. It is not intended to diagnose MRSA infection nor to guide or monitor treatment for MRSA infections. Test performance is not FDA approved in patients less than 4 years old. Performed at [facility], [address] [place], [place] [address]     Radiology Studies: DG Chest Bunk [place] 1 View  Result Date: 05/06/2022 CLINICAL DATA:  Pneumonia EXAM: PORTABLE CHEST 1 VIEW COMPARISON:  05/04/2022 FINDINGS: Normal cardiac silhouette. Low lung volumes. Fine airspace disease in the RIGHT upper lobe is slightly improved from comparison exam. Elevated LEFT hemidiaphragm. No acute osseous abnormality. IMPRESSION: 1. Slight improvement in RIGHT upper lobe pneumonia. 2. Low lung volumes. Electronically Signed   By: Suzy Bouchard M.D.   On: 05/06/2022 09:29   DG Chest Port 1 View  Result Date: 05/04/2022 CLINICAL DATA:  Shortness of breath EXAM: PORTABLE CHEST 1 VIEW COMPARISON:  05/02/2022 FINDINGS: Patchy airspace disease in the right upper lobe. Heart is normal size. Aortic calcifications. No confluent opacity on the left. No effusions or acute bony abnormality. IMPRESSION: Patchy airspace opacity in the right upper lobe concerning for pneumonia. Electronically Signed   By: Rolm Baptise M.D.   On: 05/04/2022 21:01    Scheduled Meds:  acetylcysteine  4 mL Nebulization BID   ARIPiprazole  20 mg Oral Daily   ascorbic acid  500 mg Oral BID   buPROPion  300 mg Oral Daily   carvedilol  25 mg Oral BID   Chlorhexidine Gluconate Cloth  6 each Topical Daily  cholecalciferol  1,000 Units Oral q AM   dextromethorphan  5 mL Oral Q12H   ferrous sulfate  325 mg Oral Q breakfast   finasteride  5 mg Oral Daily   fluticasone furoate-vilanterol  1 puff Inhalation Daily   folic acid  1 mg Oral q AM   insulin aspart   0-15 Units Subcutaneous TID WC   ipratropium-albuterol  3 mL Nebulization Q6H   loratadine  10 mg Oral Daily   multivitamin with minerals  1 tablet Oral Daily   mouth rinse  15 mL Mouth Rinse 4 times per day   PHENObarbital  65 mg Intravenous QODAY   pravastatin  10 mg Oral QHS   sodium chloride flush  3 mL Intravenous Q12H   umeclidinium bromide  1 puff Inhalation Daily   Continuous Infusions:  sodium chloride     cefTRIAXone (ROCEPHIN)  IV 1 g (05/06/22 0318)   heparin 1,650 Units/hr (05/06/22 1419)   potassium chloride 10 mEq (05/06/22 1346)     LOS: 4 days    Time spent: 35 mins    Delan Ksiazek, MD [facility]   If 7PM-7AM, please contact night-coverage

## 2022-05-06 NOTE — Plan of Care (Signed)
  Problem: Respiratory: Goal: Ability to maintain a clear airway will improve Outcome: Progressing   Problem: Clinical Measurements: Goal: Cardiovascular complication will be avoided Outcome: Progressing   Problem: Pain Managment: Goal: General experience of comfort will improve Outcome: Progressing

## 2022-05-06 NOTE — Consult Note (Signed)
PHARMACY CONSULT NOTE - FOLLOW UP  Pharmacy Consult for Electrolyte Monitoring and Replacement   Recent Labs: Potassium (mmol/L)  Date Value  05/06/2022 2.7 (LL)  10/03/2012 3.1 (L)   Magnesium (mg/dL)  Date Value  05/06/2022 2.3  10/02/2012 1.3 (L)   Calcium (mg/dL)  Date Value  05/06/2022 8.5 (L)   Calcium, Total (mg/dL)  Date Value  10/03/2012 8.2 (L)   Albumin (g/dL)  Date Value  05/02/2022 3.3 (L)  10/02/2012 3.4   Phosphorus (mg/dL)  Date Value  05/06/2022 3.3   Sodium (mmol/L)  Date Value  05/06/2022 143  10/03/2012 141     Assessment: 81 y.o. male with medical history significant of HTN, HLD, GERD, COPD, CHF (diastolic), seizure disorder, BPH who comes in today with presumed COPD exacerbation. Found to have a PE, started on heparin infusion. Potassium continued to be low.   Goal of Therapy:  WNL  Plan:  UOP 0.3-0.84ml/k/h; Scr 1.57>2.03>2.1 Stable Scr elevation w/ low UOP. K 2.4>2.7 - In response to receiving KCL 10 mEq IV x 6. Provider ordered additional Kcl 71meq IV q1h x4 doses. Will repeat potassium in the AM.    Lorna Dibble ,PharmD Clinical Pharmacist 05/06/2022 6:59 PM

## 2022-05-06 NOTE — Consult Note (Signed)
ANTICOAGULATION CONSULT NOTE - Initial Consult  Pharmacy Consult for heparin infusion Indication: pulmonary embolus  Allergies  Allergen Reactions   Penicillin G Rash and Other (See Comments)    Convulsions.    Has patient had a PCN reaction causing immediate rash, facial/tongue/throat swelling, SOB or lightheadedness with hypotension: not sure Has patient had a PCN reaction causing severe rash involving mucus membranes or skin necrosis: not sure Has patient had a PCN reaction that required hospitalization: not sure Has patient had a PCN reaction occurring within the last 10 years: not sure If all of the above answers are "NO", then may proceed with Cephalosporin use.    Patient Measurements: Height: 5\' 8"  (172.7 cm) Weight: 95 kg (209 lb 7 oz) IBW/kg (Calculated) : 68.4 Heparin Dosing Weight: 88.3 kg   Vital Signs: Temp: 98 F (36.7 C) (09/28 0412) Temp Source: Oral (09/28 0412) BP: 128/87 (09/28 0412) Pulse Rate: 96 (09/28 0412)  Labs: Recent Labs    05/03/22 1815 05/04/22 0448 05/04/22 0448 05/05/22 0613 05/06/22 0453  HGB  --  11.7*   < > 12.0* 12.0*  HCT  --  35.3*  --  37.1* 37.1*  PLT  --  270  --  292 273  APTT  --   --   --   --  82*  HEPARINUNFRC 0.69 0.46  --   --  >1.10*  CREATININE  --  1.57*  --  2.03* 2.10*   < > = values in this interval not displayed.     Estimated Creatinine Clearance: 31.9 mL/min (A) (by C-G formula based on SCr of 2.1 mg/dL (H)).   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 difficile diarrhea 05/2016   history of ...   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/t  frequent foley catheters   Schizophrenia ([place])    Seizure ([place])    on phenobarb   Urinary retention    frequently requiring foley placement   VRE (vancomycin resistant enterococcus) culture positive    urine    Medications:  Apixaban 10 mg x 1 dose 9/26 1000  Assessment: 81 y.o. male with medical history significant of HTN, HLD, GERD, COPD, CHF (diastolic), seizure disorder presented to ED 05/02/22 with hypoxia and wheezing. CT chest showing PE. Patient initially started on oral therapy with apixaban and now strict NPO. Pharmacy consulted to transition to IV heparin while patient NPO  Goal of Therapy:  Heparin level 0.3-0.7 units/ml aPTT 66-102 seconds Monitor platelets by anticoagulation protocol: Yes   Plan:  9/28 @ 0453:  aPTT = 82, therapeutic X 1 , HL = > 1.10 HL remains elevated from Eliquis PTA so will continue to use aPTT to guide dosing. Will draw confirmation aPTT in 8 hrs on 9/28 @ 1300 and recheck HL on 9/29 with AM Labs.   Orene Desanctis, PharmD Clinical Pharmacist   05/06/2022,6:31 AM

## 2022-05-06 NOTE — Consult Note (Signed)
PHARMACY CONSULT NOTE - FOLLOW UP  Pharmacy Consult for Electrolyte Monitoring and Replacement   Recent Labs: Potassium (mmol/L)  Date Value  05/06/2022 2.4 (LL)  10/03/2012 3.1 (L)   Magnesium (mg/dL)  Date Value  05/06/2022 2.3  10/02/2012 1.3 (L)   Calcium (mg/dL)  Date Value  05/06/2022 8.5 (L)   Calcium, Total (mg/dL)  Date Value  10/03/2012 8.2 (L)   Albumin (g/dL)  Date Value  05/02/2022 3.3 (L)  10/02/2012 3.4   Phosphorus (mg/dL)  Date Value  05/06/2022 3.3   Sodium (mmol/L)  Date Value  05/06/2022 143  10/03/2012 141     Assessment: 81 y.o. male with medical history significant of HTN, HLD, GERD, COPD, CHF (diastolic), seizure disorder, BPH who comes in today with presumed COPD exacerbation. Found to have a PE, started on heparin infusion. Potassium continued to be low.   Goal of Therapy:  WNL  Plan:  Will order Kcl 10 mEq IV x 6. Pt is NPO will avoid PO KCL.  Will repeat potassium in the PM.    [name] ,PharmD Clinical Pharmacist 05/06/2022 7:43 AM

## 2022-05-07 ENCOUNTER — Inpatient Hospital Stay: Payer: Medicare Other

## 2022-05-07 DIAGNOSIS — I2699 Other pulmonary embolism without acute cor pulmonale: Secondary | ICD-10-CM | POA: Diagnosis not present

## 2022-05-07 LAB — BASIC METABOLIC PANEL
Anion gap: 4 — ABNORMAL LOW (ref 5–15)
BUN: 54 mg/dL — ABNORMAL HIGH (ref 8–23)
CO2: 21 mmol/L — ABNORMAL LOW (ref 22–32)
Calcium: 8.4 mg/dL — ABNORMAL LOW (ref 8.9–10.3)
Chloride: 117 mmol/L — ABNORMAL HIGH (ref 98–111)
Creatinine, Ser: 2.07 mg/dL — ABNORMAL HIGH (ref 0.61–1.24)
GFR, Estimated: 32 mL/min — ABNORMAL LOW (ref >60)
Glucose, Bld: 108 mg/dL — ABNORMAL HIGH (ref 70–99)
Potassium: 2.9 mmol/L — ABNORMAL LOW (ref 3.5–5.1)
Sodium: 142 mmol/L (ref 135–145)

## 2022-05-07 LAB — CBC
HCT: 35.1 % — ABNORMAL LOW (ref 39.0–52.0)
Hemoglobin: 11.3 g/dL — ABNORMAL LOW (ref 13.0–17.0)
MCH: 27.8 pg (ref 26.0–34.0)
MCHC: 32.2 g/dL (ref 30.0–36.0)
MCV: 86.2 fL (ref 80.0–100.0)
Platelets: 225 10*3/uL (ref 150–400)
RBC: 4.07 MIL/uL — ABNORMAL LOW (ref 4.22–5.81)
RDW: 15.9 % — ABNORMAL HIGH (ref 11.5–15.5)
WBC: 8.7 10*3/uL (ref 4.0–10.5)
nRBC: 0 % (ref 0.0–0.2)

## 2022-05-07 LAB — GLUCOSE, CAPILLARY
Glucose-Capillary: 109 mg/dL — ABNORMAL HIGH (ref 70–99)
Glucose-Capillary: 111 mg/dL — ABNORMAL HIGH (ref 70–99)
Glucose-Capillary: 118 mg/dL — ABNORMAL HIGH (ref 70–99)
Glucose-Capillary: 102 mg/dL — ABNORMAL HIGH (ref 70–99)

## 2022-05-07 LAB — APTT
aPTT: 81 s — ABNORMAL HIGH (ref 24–36)
aPTT: 94 seconds — ABNORMAL HIGH (ref 24–36)

## 2022-05-07 LAB — BRAIN NATRIURETIC PEPTIDE: B Natriuretic Peptide: 74 pg/mL (ref 0.0–100.0)

## 2022-05-07 LAB — HEPARIN LEVEL (UNFRACTIONATED): Heparin Unfractionated: >1.1 IU/mL — ABNORMAL HIGH (ref 0.30–0.70)

## 2022-05-07 LAB — MAGNESIUM: Magnesium: 2.4 mg/dL (ref 1.7–2.4)

## 2022-05-07 LAB — POTASSIUM: Potassium: 3.1 mmol/L — ABNORMAL LOW (ref 3.5–5.1)

## 2022-05-07 MED ORDER — FOLIC ACID 1 MG PO TABS
1.0000 mg | ORAL_TABLET | Freq: Every morning | ORAL | Status: DC
  Administered 2022-05-08 – 2022-05-11 (×4): 1 mg
  Filled 2022-05-07 (×4): qty 1

## 2022-05-07 MED ORDER — VITAMIN C 500 MG PO TABS: 500.0000 mg | ORAL_TABLET | Freq: Two times a day (BID) | ORAL | Status: DC

## 2022-05-07 MED ORDER — FERROUS SULFATE 220 (44 FE) MG/5ML PO ELIX
325.0000 mg | ORAL_SOLUTION | Freq: Every day | ORAL | Status: DC
  Administered 2022-05-08 – 2022-05-11 (×4): 325 mg
  Filled 2022-05-07 (×5): qty 7.4
  Filled 2022-05-07: qty 7.39

## 2022-05-07 MED ORDER — ACETAMINOPHEN 650 MG RE SUPP: 650.0000 mg | Freq: Four times a day (QID) | RECTAL | Status: DC | PRN

## 2022-05-07 MED ORDER — VITAMIN D 25 MCG (1000 UNIT) PO TABS
1000.0000 [IU] | ORAL_TABLET | Freq: Every morning | ORAL | Status: DC
  Administered 2022-05-08 – 2022-05-11 (×4): 1000 [IU]
  Filled 2022-05-07 (×4): qty 1

## 2022-05-07 MED ORDER — POLYETHYLENE GLYCOL 3350 17 G PO PACK: 17.0000 g | PACK | Freq: Every day | ORAL | Status: DC | PRN

## 2022-05-07 MED ORDER — PRAVASTATIN SODIUM 20 MG PO TABS: 10.0000 mg | ORAL_TABLET | Freq: Every day | ORAL | Status: DC

## 2022-05-07 MED ORDER — ACETAMINOPHEN 325 MG PO TABS: 650.0000 mg | ORAL_TABLET | Freq: Four times a day (QID) | ORAL | Status: DC | PRN

## 2022-05-07 MED ORDER — POTASSIUM CHLORIDE 10 MEQ/100ML IV SOLN
10.0000 meq | INTRAVENOUS | Status: AC
  Administered 2022-05-07 (×6): 10 meq via INTRAVENOUS
  Filled 2022-05-07 (×6): qty 100

## 2022-05-07 MED ORDER — LORATADINE 10 MG PO TABS
10.0000 mg | ORAL_TABLET | Freq: Every day | ORAL | Status: DC
  Administered 2022-05-08 – 2022-05-11 (×4): 10 mg
  Filled 2022-05-07 (×4): qty 1

## 2022-05-07 MED ORDER — POTASSIUM CHLORIDE 10 MEQ/100ML IV SOLN
10.0000 meq | INTRAVENOUS | Status: AC
  Administered 2022-05-07 (×4): 10 meq via INTRAVENOUS
  Filled 2022-05-07 (×4): qty 100

## 2022-05-07 MED ORDER — DEXTROMETHORPHAN POLISTIREX ER 30 MG/5ML PO SUER
5.0000 mL | Freq: Two times a day (BID) | ORAL | Status: DC
  Filled 2022-05-07: qty 5

## 2022-05-07 MED ORDER — GUAIFENESIN 100 MG/5ML PO LIQD: 5.0000 mL | ORAL | Status: DC | PRN

## 2022-05-07 MED ORDER — BUPROPION HCL 75 MG PO TABS
150.0000 mg | ORAL_TABLET | Freq: Two times a day (BID) | ORAL | Status: DC
  Administered 2022-05-08 – 2022-05-11 (×8): 150 mg
  Filled 2022-05-07 (×11): qty 2

## 2022-05-07 MED ORDER — CARVEDILOL 25 MG PO TABS: 25.0000 mg | ORAL_TABLET | Freq: Two times a day (BID) | ORAL | Status: DC

## 2022-05-07 MED ORDER — ARIPIPRAZOLE 10 MG PO TABS
20.0000 mg | ORAL_TABLET | Freq: Every day | ORAL | Status: DC
  Administered 2022-05-08 – 2022-05-11 (×4): 20 mg
  Filled 2022-05-07 (×5): qty 2

## 2022-05-07 MED ORDER — OLANZAPINE 5 MG PO TBDP
5.0000 mg | ORAL_TABLET | Freq: Once | ORAL | Status: AC
  Administered 2022-05-07: 5 mg via ORAL
  Filled 2022-05-07: qty 1

## 2022-05-07 MED ORDER — HYDROCODONE-ACETAMINOPHEN 5-325 MG PO TABS
1.0000 | ORAL_TABLET | ORAL | Status: DC | PRN
  Administered 2022-05-08 – 2022-05-11 (×2): 2
  Filled 2022-05-07 (×3): qty 2

## 2022-05-07 MED ORDER — ADULT MULTIVITAMIN W/MINERALS CH
1.0000 | ORAL_TABLET | Freq: Every day | ORAL | Status: DC
  Administered 2022-05-08 – 2022-05-11 (×4): 1
  Filled 2022-05-07 (×4): qty 1

## 2022-05-07 NOTE — Consult Note (Signed)
ANTICOAGULATION CONSULT NOTE - Initial Consult  Pharmacy Consult for heparin infusion Indication: pulmonary embolus  Allergies  Allergen Reactions   Penicillin G Rash and Other (See Comments)    Convulsions.    Has patient had a PCN reaction causing immediate rash, facial/tongue/throat swelling, SOB or lightheadedness with hypotension: not sure Has patient had a PCN reaction causing severe rash involving mucus membranes or skin necrosis: not sure Has patient had a PCN reaction that required hospitalization: not sure Has patient had a PCN reaction occurring within the last 10 years: not sure If all of the above answers are "NO", then may proceed with Cephalosporin use.    Patient Measurements: Height: 5\' 8"  (172.7 cm) Weight: 95 kg (209 lb 7 oz) IBW/kg (Calculated) : 68.4 Heparin Dosing Weight: 88.3 kg   Vital Signs: Temp: 98 F (36.7 C) (09/29 0846) Temp Source: Oral (09/29 0507) BP: 130/79 (09/29 0846) Pulse Rate: 89 (09/29 0846)  Labs: Recent Labs    05/05/22 9242 05/05/22 6834 05/06/22 0453 05/06/22 1310 05/07/22 0000 05/07/22 0821  HGB 12.0*  --  12.0*  --   --  11.3*  HCT 37.1*  --  37.1*  --   --  35.1*  PLT 292  --  273  --   --  225  APTT  --    < > 82* 62* 81* 94*  HEPARINUNFRC  --   --  >1.10*  --   --  >1.10*  CREATININE 2.03*  --  2.10*  --   --  2.07*   < > = values in this interval not displayed.     Estimated Creatinine Clearance: 32.3 mL/min (A) (by C-G formula based on SCr of 2.07 mg/dL (H)).   Medical History: Past Medical History:  Diagnosis Date   Bipolar 1 disorder ([place])    C. difficile diarrhea 05/2016   history of ...   CHF (congestive heart failure) (HCC)    Chronic kidney disease 05/21/2016   acute renal failure with sepsis and uti   COPD (chronic obstructive pulmonary disease) (HCC)    DVT (deep venous thrombosis) (HCC)    GERD (gastroesophageal reflux disease)    Hematuria    High cholesterol    History of kidney stones     Hypertension    Penile erosion 2018   d/t frequent foley catheters   Schizophrenia ([place])    Seizure ([place])    on phenobarb   Urinary retention    frequently requiring foley placement   VRE (vancomycin resistant enterococcus) culture positive    urine    Medications:  Apixaban 10 mg x 1 dose 9/26 @ 1039  Assessment: 81 y.o. male with medical history significant of HTN, HLD, GERD, COPD, CHF (diastolic), seizure disorder presented to ED 05/02/22 with hypoxia and wheezing. CT chest showing PE. Patient initially started on oral therapy with apixaban and now strict NPO because of significant risk of aspiration. Pharmacy consulted to manage heparin while patient NPO. Last dose of apixaban was 9/26 @ 1039. Hgb, Hct, PLT stable.  Goal of Therapy:  Heparin level 0.3-0.7 units/ml aPTT 66-102 seconds Monitor platelets by anticoagulation protocol: Yes  Date Time aPTT Rate/Comment 9/28 0453 82 Therapeutic x 1; 1500 un/hr 9/28 1335 62 Subtherapeutic; 1500 >> 1650 un/hr  9/29     0000    81        Therapeutic X 1; 1650 un/hr  9/29 0821 94 Therapeutic x 2; 1650 un/hr   Date Time HL Rate/Comment 9/28  0453 >1.10 HL and aPTT not correlating, so still titrate by aPTT 9/29 0821 >1.10 HL and aPTT not correlating, so still titrate by aPTT      Baseline Labs: aPTT - 30; INR - 1.2 Hgb - 13.7; Plts - 273   Plan: Continue heparin at current rate of 1650 un/hr Now that aPTT therapeutic x 2, recheck aPTT and HL with AM labs on 9/30 Continue to titrate if necessary by aPTT's until lab correlation is noted, then titrate by anti-xa alone Continue to monitor CBC daily while on heparin  Terie Purser, PharmD PGY-1 Pharmacy Resident 05/07/2022 9:21 AM

## 2022-05-07 NOTE — Consult Note (Signed)
ANTICOAGULATION CONSULT NOTE - Initial Consult  Pharmacy Consult for heparin infusion Indication: pulmonary embolus  Allergies  Allergen Reactions   Penicillin G Rash and Other (See Comments)    Convulsions.    Has patient had a PCN reaction causing immediate rash, facial/tongue/throat swelling, SOB or lightheadedness with hypotension: not sure Has patient had a PCN reaction causing severe rash involving mucus membranes or skin necrosis: not sure Has patient had a PCN reaction that required hospitalization: not sure Has patient had a PCN reaction occurring within the last 10 years: not sure If all of the above answers are "NO", then may proceed with Cephalosporin use.    Patient Measurements: Height: 5\' 8"  (172.7 cm) Weight: 95 kg (209 lb 7 oz) IBW/kg (Calculated) : 68.4 Heparin Dosing Weight: 88.3 kg   Vital Signs: Temp: 98.6 F (37 C) (09/29 0000) Temp Source: Oral (09/29 0000) BP: 141/90 (09/29 0000) Pulse Rate: 92 (09/28 1555)  Labs: Recent Labs    05/04/22 0448 05/05/22 4193 05/06/22 0453 05/06/22 1310 05/07/22 0000  HGB 11.7* 12.0* 12.0*  --   --   HCT 35.3* 37.1* 37.1*  --   --   PLT 270 292 273  --   --   APTT  --   --  82* 62* 81*  HEPARINUNFRC 0.46  --  >1.10*  --   --   CREATININE 1.57* 2.03* 2.10*  --   --      Estimated Creatinine Clearance: 31.9 mL/min (A) (by C-G formula based on SCr of 2.1 mg/dL (H)).   Medical History: Past Medical History:  Diagnosis Date   Bipolar 1 disorder ([place])    C. difficile diarrhea 05/2016   history of ...   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   Schizophrenia ([place])     Seizure ([place])    on phenobarb   Urinary retention    frequently requiring foley placement   VRE (vancomycin resistant enterococcus) culture positive    urine    Medications:  Apixaban 10 mg x 1 dose 9/26 @ 1039  Assessment: 81 y.o. male with medical history significant of HTN, HLD, GERD, COPD, CHF (diastolic), seizure disorder presented to ED 05/02/22 with hypoxia and wheezing. CT chest showing PE. Patient initially started on oral therapy with apixaban and now strict NPO because of significant risk of aspiration. Pharmacy consulted to transition to IV heparin while patient NPO. Last dose of apixaban was 9/26 @ 1039. Hgb, Hct, PLT stable.  Goal of Therapy:  Heparin level 0.3-0.7 units/ml aPTT 66-102 seconds Monitor platelets by anticoagulation protocol: Yes  Date Time aPTT/HL Rate/Comment 9/28 0453 aPTT 82 Therapeutic x 1; 1500 un/hr 9/28 0453 HL >1.10 HL and aPTT not correlating, so still titrate by aPTT 9/28 1335 aPTT 62 Subtherapeutic; 1500 >> 1650 un/hr  9/29     0000   aPTT 81          Therapeutic X 1     Baseline Labs: aPTT - 30; INR - 1.2 Hgb - 13.7; Plts - 273   Plan: 9/29: aPTT @ 0000 = 81, therapeutic  X 1 Will continue pt on current rate and recheck aPTT and HL on 9/29 @ 0800.   Ahava Kissoon D 05/07/2022 1:00 AM

## 2022-05-07 NOTE — Progress Notes (Addendum)
PROGRESS NOTE    [name]  [id number] DOB: 04/24/1941 DOA: 05/02/2022 PCP: Rica Koyanagi, MD   Brief Narrative:  This 81 y.o. male with medical history significant of HTN, HLD, GERD, COPD, CHF (diastolic), seizure disorder, BPH who comes in ED with presumed COPD exacerbation.  Per the EDP he was wheezing on admission and hypoxic.  EMS had to put him on CPAP to get his oxygen saturations up.  His blood gases were essentially normal.  He was weaned down to nasal cannula.  His chest x-ray was negative.  COVID was also negative. CT chest with contrast Filling defects within right lower lobe pulmonary arterial branches compatible with pulmonary emboli. Associated right basilar airspace opacity could reflect atelectasis or infarcts.Elevation of the right hemidiaphragm.  Patient is admitted for acute pulmonary embolism, started on IV heparin.  Patient has significant risk of aspiration.  Speech and swallow recommended n.p.o. with alternate means of feeding.  Palliative care consulted to discuss goals of care.  Patient has no capacity to make decision.  No alternate decision-maker has been identified at this time.  Pulmonology consulted.   Assessment & Plan:   Principal Problem:   Acute pulmonary embolism (HCC) Active Problems:   UTI (urinary tract infection)   Chronic diastolic CHF (congestive heart failure) (HCC)   Seizure disorder (HCC)   Depression   Hypertension   Mixed hyperlipidemia   BPH (benign prostatic hyperplasia)   Normocytic anemia   Hypokalemia   DVT of lower extremity, bilateral (HCC)   Venous insufficiency of both lower extremities   COPD exacerbation (HCC)   Malnutrition of moderate degree   Acute deep vein thrombosis (DVT) of left lower extremity (HCC)   Acute respiratory failure with hypoxia (HCC)  Acute pulmonary embolism / Acute Right LE DVT:  Patient found to have acute pulmonary embolism and acute Right LE DVT He was started on heparin gtt. Likely  primary cause for his respiratory distress and oxygen requirements. Continue supplemental oxygen as needed. He was successfully transitioned to Eliquis, put back on heparin IV due to n.p.o. and high aspiration risk.  UTI in the setting of chronic suprapubic catheter: Patient has indwelling suprapubic catheter and urine had a lot of sediment in it. Suprapubic catheter was changed  (05/03/22) It was changed on August 25th 2023 at urology office Remains afebrile without leukocytosis.   Started on antibiotic for possible aspiration pneumonia.  Aspiration pneumonia: Patient is started on IV antibiotics.   Chest x-ray shows improving infiltrate. Continue to monitor clinical status   Chronic diastolic CHF: Continue ACE inhibitor. Continue carvedilol Continue Lasix as needed BNP is slightly elevated but chest x-ray does not show fluid overload.   Seizure disorder (HCC) Continue phenobarbital.   Depression Continue Wellbutrin, Abilify.  Hypokalemia: Replacement in progress.  Continue to monitor   Hypertension Continue lisinopril, carvedilol.   Mixed hyperlipidemia Continue Pravachol.   BPH (benign prostatic hyperplasia) Continue Proscar.   Normocytic anemia Hemoglobin is stable.   COPD: Currently no wheezing. Continue inhalers.   Venous insufficiency of both lower extremities Continue Lasix as needed   Dysphagia with high aspiration risk: Speech and swallow evaluation completed, high risk for aspiration recommended NPO. Patient was empirically started on ceftriaxone for aspiration pneumonia. Palliative care consulted to discuss goals of care, alternate means of feeding. Patient has no family member.  Obesity: Estimated body mass index is 31.84 kg/m as calculated from the following:   Height as of this encounter: 5\' 8"  (1.727 m).   Weight  as of this encounter: 95 kg.      DVT prophylaxis: Eliquis Code Status: Full code Family Communication: No family at  bedside Disposition Plan:   Status is: Inpatient Remains inpatient appropriate because: Admitted for acute PE, acute right LE DVT started on Eliquis.  Patient has dysphagia with high aspiration risk,  Palliative care consulted to discuss goals of care, alternate means of feeding. Patient has no capacity to make decision.  No alternate decision-maker has been identified at this time.  Consultants:  Palliative care  Procedures: None Antimicrobials:  Anti-infectives (From admission, onward)    Start     Dose/Rate Route Frequency Ordered Stop   05/05/22 0215  cefTRIAXone (ROCEPHIN) 1 g in sodium chloride 0.9 % 100 mL IVPB        1 g 200 mL/hr over 30 Minutes Intravenous Every 24 hours 05/05/22 0118 05/10/22 0214       Subjective: Patient was seen and examined at bedside.  Overnight events noted.   Patient continued to remain confused, alert oriented x1.  He is following commands. Patient is at high risk for aspiration.  He remains on antibiotics.  Objective: Vitals:   05/07/22 0507 05/07/22 0732 05/07/22 0846 05/07/22 1138  BP: 129/79  130/79 139/84  Pulse: 87  89 87  Resp: 18  14   Temp: 98.5 F (36.9 C)  98 F (36.7 C) 97.6 F (36.4 C)  TempSrc: Oral   Oral  SpO2: 100% 94% 96% 96%  Weight:      Height:        Intake/Output Summary (Last 24 hours) at 05/07/2022 1319 Last data filed at 05/07/2022 0534 Gross per 24 hour  Intake 2281.31 ml  Output 650 ml  Net 1631.31 ml   Filed Weights   05/02/22 2102 05/03/22 0842  Weight: 102.5 kg 95 kg    Examination:  General exam: Appears comfortable, NAD.  Deconditioned Respiratory system: Decreased breath sounds, respiratory effort normal.  RR 16 Cardiovascular system: S1 & S2 heard, regular rate and rhythm, no murmur.   Gastrointestinal system: Abdomen is soft, non tender, non distended, BS+ Central nervous system: Alert and oriented X 1 . No focal neurological deficits. Extremities: No edema, no cyanosis, no  clubbing. Skin: No rashes, lesions or ulcers Psychiatry: Mood & affect appropriate.     Data Reviewed: I have personally reviewed following labs and imaging studies  CBC: Recent Labs  Lab 05/02/22 2121 05/03/22 0406 05/04/22 0448 05/05/22 [id number] 05/06/22 0453 05/07/22 0821  WBC 8.1 6.4 9.6 10.4 8.4 8.7  NEUTROABS 6.3  --   --   --   --   --   HGB 13.7 12.1* 11.7* 12.0* 12.0* 11.3*  HCT 43.1 37.7* 35.3* 37.1* 37.1* 35.1*  MCV 86.7 86.3 84.7 84.7 85.9 86.2  PLT 273 231 270 292 273 [id number]   Basic Metabolic Panel: Recent Labs  Lab 05/02/22 2120 05/02/22 2121 05/03/22 0406 05/04/22 0448 05/04/22 1329 05/05/22 0613 05/05/22 1343 05/05/22 2001 05/06/22 0453 05/06/22 1737 05/07/22 0821  NA  --    < > 143 142  --  143  --   --  143  --  142  K  --    < > 2.6* 2.3*   < > 2.4* 2.6* 2.8* 2.4* 2.7* 2.9*  CL  --    < > 110 111  --  114*  --   --  113*  --  117*  CO2  --    < > 24 24  --  23  --   --  22  --  21*  GLUCOSE  --    < > 148* 141*  --  173*  --   --  127*  --  108*  BUN  --    < > 18 31*  --  40*  --   --  49*  --  54*  CREATININE  --    < > 1.29* 1.57*  --  2.03*  --   --  2.10*  --  2.07*  CALCIUM  --    < > 8.5* 8.6*  --  8.7*  --   --  8.5*  --  8.4*  MG 2.2  --   --  2.1  --  2.5*  --   --  2.3  --  2.4  PHOS  --   --   --   --   --   --   --   --  3.3  --   --    < > = values in this interval not displayed.   GFR: Estimated Creatinine Clearance: 32.3 mL/min (A) (by C-G formula based on SCr of 2.07 mg/dL (H)). Liver Function Tests: Recent Labs  Lab 05/02/22 2121  AST 16  ALT 10  ALKPHOS 100  BILITOT 0.9  PROT 8.1  ALBUMIN 3.3*   No results for input(s): "LIPASE", "AMYLASE" in the last 168 hours. No results for input(s): "AMMONIA" in the last 168 hours. Coagulation Profile: Recent Labs  Lab 05/02/22 2121  INR 1.2   Cardiac Enzymes: No results for input(s): "CKTOTAL", "CKMB", "CKMBINDEX", "TROPONINI" in the last 168 hours. BNP (last 3 results) No  results for input(s): "PROBNP" in the last 8760 hours. HbA1C: No results for input(s): "HGBA1C" in the last 72 hours.  CBG: Recent Labs  Lab 05/06/22 1238 05/06/22 1610 05/07/22 0527 05/07/22 0909 05/07/22 1138  GLUCAP 106* 134* 109* 111* 118*   Lipid Profile: No results for input(s): "CHOL", "HDL", "LDLCALC", "TRIG", "CHOLHDL", "LDLDIRECT" in the last 72 hours. Thyroid Function Tests: No results for input(s): "TSH", "T4TOTAL", "FREET4", "T3FREE", "THYROIDAB" in the last 72 hours.  Anemia Panel: No results for input(s): "VITAMINB12", "FOLATE", "FERRITIN", "TIBC", "IRON", "RETICCTPCT" in the last 72 hours. Sepsis Labs: Recent Labs  Lab 05/02/22 2121 05/02/22 2319 05/06/22 0448  PROCALCITON  --   --  <0.10  LATICACIDVEN 1.5 0.4*  --     Recent Results (from the past 240 hour(s))  SARS Coronavirus 2 by RT PCR (hospital order, performed in [facility] hospital lab) *cepheid single result test* Anterior Nasal Swab     Status: None   Collection Time: 05/02/22  9:21 PM   Specimen: Anterior Nasal Swab  Result Value Ref Range Status   SARS Coronavirus 2 by RT PCR NEGATIVE NEGATIVE Final    Comment: (NOTE) SARS-CoV-2 target nucleic acids are NOT DETECTED.  The SARS-CoV-2 RNA is generally detectable in upper and lower respiratory specimens during the acute phase of infection. The lowest concentration of SARS-CoV-2 viral copies this assay can detect is 250 copies / mL. A negative result does not preclude SARS-CoV-2 infection and should not be used as the sole basis for treatment or other patient management decisions.  A negative result may occur with improper specimen collection / handling, submission of specimen other than nasopharyngeal swab, presence of viral mutation(s) within the areas targeted by this assay, and inadequate number of viral copies (<250 copies / mL). A negative result must be combined with  clinical observations, patient history, and epidemiological  information.  Fact Sheet for Patients:   https://www.patel.info/  Fact Sheet for Healthcare Providers: https://hall.com/  This test is not yet approved or  cleared by the [place] FDA and has been authorized for detection and/or diagnosis of SARS-CoV-2 by FDA under an Emergency Use Authorization (EUA).  This EUA will remain in effect (meaning this test can be used) for the duration of the COVID-19 declaration under Section 564(b)(1) of the Act, 21 U.S.C. section 360bbb-3(b)(1), unless the authorization is terminated or revoked sooner.  Performed at [facility], [place], [place] [address]   Respiratory (~20 pathogens) panel by PCR     Status: None   Collection Time: 05/03/22  1:18 AM   Specimen: Nasopharyngeal Swab; Respiratory  Result Value Ref Range Status   Adenovirus NOT DETECTED NOT DETECTED Final   Coronavirus 229E NOT DETECTED NOT DETECTED Final    Comment: (NOTE) The Coronavirus on the Respiratory Panel, DOES NOT test for the novel  Coronavirus (2019 nCoV)    Coronavirus HKU1 NOT DETECTED NOT DETECTED Final   Coronavirus NL63 NOT DETECTED NOT DETECTED Final   Coronavirus OC43 NOT DETECTED NOT DETECTED Final   Metapneumovirus NOT DETECTED NOT DETECTED Final   Rhinovirus / Enterovirus NOT DETECTED NOT DETECTED Final   Influenza A NOT DETECTED NOT DETECTED Final   Influenza B NOT DETECTED NOT DETECTED Final   Parainfluenza Virus 1 NOT DETECTED NOT DETECTED Final   Parainfluenza Virus 2 NOT DETECTED NOT DETECTED Final   Parainfluenza Virus 3 NOT DETECTED NOT DETECTED Final   Parainfluenza Virus 4 NOT DETECTED NOT DETECTED Final   Respiratory Syncytial Virus NOT DETECTED NOT DETECTED Final   Bordetella pertussis NOT DETECTED NOT DETECTED Final   Bordetella Parapertussis NOT DETECTED NOT DETECTED Final   Chlamydophila pneumoniae NOT DETECTED NOT DETECTED Final   Mycoplasma pneumoniae NOT DETECTED  NOT DETECTED Final    Comment: Performed at [facility], [place]. [address], [place], [place] [address]  MRSA Next Gen by PCR, Nasal     Status: None   Collection Time: 05/03/22 11:24 AM   Specimen: Nasal Mucosa; Nasal Swab  Result Value Ref Range Status   MRSA by PCR Next Gen NOT DETECTED NOT DETECTED Final    Comment: (NOTE) The GeneXpert MRSA Assay (FDA approved for NASAL specimens only), is one component of a comprehensive MRSA colonization surveillance program. It is not intended to diagnose MRSA infection nor to guide or monitor treatment for MRSA infections. Test performance is not FDA approved in patients less than 41 years old. Performed at [facility], [address] [place], [place] [address]     Radiology Studies: DG Chest [place] 1 View  Result Date: 05/06/2022 CLINICAL DATA:  Pneumonia EXAM: PORTABLE CHEST 1 VIEW COMPARISON:  05/04/2022 FINDINGS: Normal cardiac silhouette. Low lung volumes. Fine airspace disease in the RIGHT upper lobe is slightly improved from comparison exam. Elevated LEFT hemidiaphragm. No acute osseous abnormality. IMPRESSION: 1. Slight improvement in RIGHT upper lobe pneumonia. 2. Low lung volumes. Electronically Signed   By: Suzy Bouchard M.D.   On: 05/06/2022 09:29    Scheduled Meds:  acetylcysteine  4 mL Nebulization BID   ARIPiprazole  20 mg Oral Daily   ascorbic acid  500 mg Oral BID   buPROPion  300 mg Oral Daily   carvedilol  25 mg Oral BID   Chlorhexidine Gluconate Cloth  6 each Topical Daily   cholecalciferol  1,000 Units Oral q AM   dextromethorphan  5 mL Oral Q12H   ferrous sulfate  325 mg Oral Q breakfast   finasteride  5 mg Oral Daily   fluticasone furoate-vilanterol  1 puff Inhalation Daily   folic acid  1 mg Oral q AM   insulin aspart  0-15 Units Subcutaneous TID WC   ipratropium-albuterol  3 mL Nebulization Q6H   loratadine  10 mg Oral Daily   multivitamin with minerals  1 tablet Oral Daily   mouth rinse  15 mL  Mouth Rinse 4 times per day   PHENObarbital  65 mg Intravenous QODAY   pravastatin  10 mg Oral QHS   sodium chloride flush  3 mL Intravenous Q12H   umeclidinium bromide  1 puff Inhalation Daily   Continuous Infusions:  sodium chloride Stopped (05/07/22 1030)   cefTRIAXone (ROCEPHIN)  IV 1 g (05/07/22 0311)   heparin Stopped (05/07/22 1030)   potassium chloride 10 mEq (05/07/22 1237)     LOS: 5 days    Time spent: 35 mins    Ansel Ferrall, MD [facility]   If 7PM-7AM, please contact night-coverage

## 2022-05-07 NOTE — Progress Notes (Signed)
PROGRESS NOTE    [name]  QQI:[id number] DOB: July 01, 1941 DOA: 05/02/2022 PCP: Rosetta Posner, MD   Brief Narrative:  This 81 y.o. male with medical history significant of HTN, HLD, GERD, COPD, CHF (diastolic), seizure disorder, BPH who comes in ED with presumed COPD exacerbation.  Per the EDP he was wheezing on admission and hypoxic.  EMS had to put him on CPAP to get his oxygen saturations up.  His blood gases were essentially normal.  He was weaned down to nasal cannula.  His chest x-ray was negative.  COVID was also negative. CT chest with contrast Filling defects within right lower lobe pulmonary arterial branches compatible with pulmonary emboli. Associated right basilar airspace opacity could reflect atelectasis or infarcts.Elevation of the right hemidiaphragm.  Patient is admitted for acute pulmonary embolism, started on IV heparin.  Patient has significant risk of aspiration.  Speech and swallow recommended n.p.o. with alternate means of feeding.  Palliative care consulted to discuss goals of care.   Assessment & Plan:   Principal Problem:   Acute pulmonary embolism (HCC) Active Problems:   UTI (urinary tract infection)   Chronic diastolic CHF (congestive heart failure) (HCC)   Seizure disorder (HCC)   Depression   Hypertension   Mixed hyperlipidemia   BPH (benign prostatic hyperplasia)   Normocytic anemia   Hypokalemia   DVT of lower extremity, bilateral (HCC)   Venous insufficiency of both lower extremities   COPD exacerbation (HCC)   Malnutrition of moderate degree   Acute deep vein thrombosis (DVT) of left lower extremity (HCC)   Acute respiratory failure with hypoxia (HCC)  Acute pulmonary embolism / Acute Right LE DVT:  Patient found to have acute pulmonary embolism and acute Right LE DVT He was started on heparin gtt. Likely primary cause for his respiratory distress and oxygen requirements. Continue supplemental oxygen as needed. He was successfully  transitioned to Eliquis, put back on heparin IV due to n.p.o. and high aspiration risk.  UTI in the setting of chronic suprapubic catheter: Patient has indwelling suprapubic catheter and urine had a lot of sediment in it. Suprapubic catheter was changed  (05/03/22) It was changed on August 25th 2023 at urology office Remains afebrile without leukocytosis.   Started on antibiotic for possible aspiration pneumonia.  Aspiration pneumonia: Patient is started on IV antibiotics.   Chest x-ray shows improving infiltrate. Continue to monitor clinical status   Chronic diastolic CHF: Continue ACE inhibitor. Continue carvedilol Continue Lasix as needed BNP is slightly elevated but chest x-ray does not show fluid overload.   Seizure disorder (HCC) Continue phenobarbital.   Depression Continue Wellbutrin, Abilify.  Hypokalemia: Replacement in progress.  Continue to monitor   Hypertension Continue lisinopril, carvedilol.   Mixed hyperlipidemia Continue Pravachol.   BPH (benign prostatic hyperplasia) Continue Proscar.   Normocytic anemia Hemoglobin is stable.   Hypokalemia Replaced.  Continue to monitor   COPD: Currently no wheezing. Continue inhalers.   Venous insufficiency of both lower extremities Continue Lasix as needed   Dysphagia with high aspiration risk: Speech and swallow evaluation completed, high risk for aspiration recommended NPO. Patient was empirically started on ceftriaxone for aspiration pneumonia. Palliative care consulted to discuss goals of care, alternate means of feeding. Patient has no family member.  Obesity: Estimated body mass index is 31.84 kg/m as calculated from the following:   Height as of this encounter: 5\' 8"  (1.727 m).   Weight as of this encounter: 95 kg.      DVT prophylaxis:  Eliquis Code Status: Full code Family Communication: No family at bedside Disposition Plan:   Status is: Inpatient Remains inpatient appropriate  because: Admitted for acute PE, acute right LE DVT started on Eliquis.  Patient has dysphagia with high aspiration risk,  Palliative care consulted to discuss goals of care, alternate means of feeding.    Consultants:  Palliative care  Procedures: None Antimicrobials:  Anti-infectives (From admission, onward)    Start     Dose/Rate Route Frequency Ordered Stop   05/05/22 0215  cefTRIAXone (ROCEPHIN) 1 g in sodium chloride 0.9 % 100 mL IVPB        1 g 200 mL/hr over 30 Minutes Intravenous Every 24 hours 05/05/22 0118 05/10/22 0214       Subjective: Patient was seen and examined at bedside.  Overnight events noted.   Patient was empirically started on ceftriaxone and Mucomyst nebulization due to coughing episode. Patient is at high risk for aspiration.  He remains on antibiotics.  Objective: Vitals:   05/07/22 0507 05/07/22 0732 05/07/22 0846 05/07/22 1138  BP: 129/79  130/79 139/84  Pulse: 87  89 87  Resp: 18  14   Temp: 98.5 F (36.9 C)  98 F (36.7 C) 97.6 F (36.4 C)  TempSrc: Oral   Oral  SpO2: 100% 94% 96% 96%  Weight:      Height:        Intake/Output Summary (Last 24 hours) at 05/07/2022 1157 Last data filed at 05/07/2022 0534 Gross per 24 hour  Intake 2281.31 ml  Output 650 ml  Net 1631.31 ml    Filed Weights   05/02/22 2102 05/03/22 0842  Weight: 102.5 kg 95 kg    Examination:  General exam: Appears comfortable, not in any acute distress.  Deconditioned Respiratory system: Decreased breath sounds, respiratory effort normal.  RR 15 Cardiovascular system: S1 & S2 heard, regular rate and rhythm, no murmur.   Gastrointestinal system: Abdomen is soft, non tender, non distended, BS+ Central nervous system: Alert and oriented X 1 . No focal neurological deficits. Extremities: No edema, no cyanosis, no clubbing. Skin: No rashes, lesions or ulcers Psychiatry: Mood & affect appropriate.     Data Reviewed: I have personally reviewed following labs and  imaging studies  CBC: Recent Labs  Lab 05/02/22 2121 05/03/22 0406 05/04/22 0448 05/05/22 6712 05/06/22 0453 05/07/22 0821  WBC 8.1 6.4 9.6 10.4 8.4 8.7  NEUTROABS 6.3  --   --   --   --   --   HGB 13.7 12.1* 11.7* 12.0* 12.0* 11.3*  HCT 43.1 37.7* 35.3* 37.1* 37.1* 35.1*  MCV 86.7 86.3 84.7 84.7 85.9 86.2  PLT 273 231 270 292 273 225    Basic Metabolic Panel: Recent Labs  Lab 05/02/22 2120 05/02/22 2121 05/03/22 0406 05/04/22 0448 05/04/22 1329 05/05/22 0613 05/05/22 1343 05/05/22 2001 05/06/22 0453 05/06/22 1737 05/07/22 0821  NA  --    < > 143 142  --  143  --   --  143  --  142  K  --    < > 2.6* 2.3*   < > 2.4* 2.6* 2.8* 2.4* 2.7* 2.9*  CL  --    < > 110 111  --  114*  --   --  113*  --  117*  CO2  --    < > 24 24  --  23  --   --  22  --  21*  GLUCOSE  --    < >  148* 141*  --  173*  --   --  127*  --  108*  BUN  --    < > 18 31*  --  40*  --   --  49*  --  54*  CREATININE  --    < > 1.29* 1.57*  --  2.03*  --   --  2.10*  --  2.07*  CALCIUM  --    < > 8.5* 8.6*  --  8.7*  --   --  8.5*  --  8.4*  MG 2.2  --   --  2.1  --  2.5*  --   --  2.3  --  2.4  PHOS  --   --   --   --   --   --   --   --  3.3  --   --    < > = values in this interval not displayed.    GFR: Estimated Creatinine Clearance: 32.3 mL/min (A) (by C-G formula based on SCr of 2.07 mg/dL (H)). Liver Function Tests: Recent Labs  Lab 05/02/22 2121  AST 16  ALT 10  ALKPHOS 100  BILITOT 0.9  PROT 8.1  ALBUMIN 3.3*    No results for input(s): "LIPASE", "AMYLASE" in the last 168 hours. No results for input(s): "AMMONIA" in the last 168 hours. Coagulation Profile: Recent Labs  Lab 05/02/22 2121  INR 1.2    Cardiac Enzymes: No results for input(s): "CKTOTAL", "CKMB", "CKMBINDEX", "TROPONINI" in the last 168 hours. BNP (last 3 results) No results for input(s): "PROBNP" in the last 8760 hours. HbA1C: No results for input(s): "HGBA1C" in the last 72 hours.  CBG: Recent Labs  Lab  05/06/22 1238 05/06/22 1610 05/07/22 0527 05/07/22 0909 05/07/22 1138  GLUCAP 106* 134* 109* 111* 118*    Lipid Profile: No results for input(s): "CHOL", "HDL", "LDLCALC", "TRIG", "CHOLHDL", "LDLDIRECT" in the last 72 hours. Thyroid Function Tests: No results for input(s): "TSH", "T4TOTAL", "FREET4", "T3FREE", "THYROIDAB" in the last 72 hours.  Anemia Panel: No results for input(s): "VITAMINB12", "FOLATE", "FERRITIN", "TIBC", "IRON", "RETICCTPCT" in the last 72 hours. Sepsis Labs: Recent Labs  Lab 05/02/22 2121 05/02/22 2319 05/06/22 0448  PROCALCITON  --   --  <0.10  LATICACIDVEN 1.5 0.4*  --      Recent Results (from the past 240 hour(s))  SARS Coronavirus 2 by RT PCR (hospital order, performed in [facility] hospital lab) *cepheid single result test* Anterior Nasal Swab     Status: None   Collection Time: 05/02/22  9:21 PM   Specimen: Anterior Nasal Swab  Result Value Ref Range Status   SARS Coronavirus 2 by RT PCR NEGATIVE NEGATIVE Final    Comment: (NOTE) SARS-CoV-2 target nucleic acids are NOT DETECTED.  The SARS-CoV-2 RNA is generally detectable in upper and lower respiratory specimens during the acute phase of infection. The lowest concentration of SARS-CoV-2 viral copies this assay can detect is 250 copies / mL. A negative result does not preclude SARS-CoV-2 infection and should not be used as the sole basis for treatment or other patient management decisions.  A negative result may occur with improper specimen collection / handling, submission of specimen other than nasopharyngeal swab, presence of viral mutation(s) within the areas targeted by this assay, and inadequate number of viral copies (<250 copies / mL). A negative result must be combined with clinical observations, patient history, and epidemiological information.  Fact Sheet for Patients:   RoadLapTop.co.za  Fact Sheet for Healthcare  Providers: https://hall.com/  This test is not yet approved or  cleared by the [place] FDA and has been authorized for detection and/or diagnosis of SARS-CoV-2 by FDA under an Emergency Use Authorization (EUA).  This EUA will remain in effect (meaning this test can be used) for the duration of the COVID-19 declaration under Section 564(b)(1) of the Act, 21 U.S.C. section 360bbb-3(b)(1), unless the authorization is terminated or revoked sooner.  Performed at [facility], [place], [place] [address]   Respiratory (~20 pathogens) panel by PCR     Status: None   Collection Time: 05/03/22  1:18 AM   Specimen: Nasopharyngeal Swab; Respiratory  Result Value Ref Range Status   Adenovirus NOT DETECTED NOT DETECTED Final   Coronavirus 229E NOT DETECTED NOT DETECTED Final    Comment: (NOTE) The Coronavirus on the Respiratory Panel, DOES NOT test for the novel  Coronavirus (2019 nCoV)    Coronavirus HKU1 NOT DETECTED NOT DETECTED Final   Coronavirus NL63 NOT DETECTED NOT DETECTED Final   Coronavirus OC43 NOT DETECTED NOT DETECTED Final   Metapneumovirus NOT DETECTED NOT DETECTED Final   Rhinovirus / Enterovirus NOT DETECTED NOT DETECTED Final   Influenza A NOT DETECTED NOT DETECTED Final   Influenza B NOT DETECTED NOT DETECTED Final   Parainfluenza Virus 1 NOT DETECTED NOT DETECTED Final   Parainfluenza Virus 2 NOT DETECTED NOT DETECTED Final   Parainfluenza Virus 3 NOT DETECTED NOT DETECTED Final   Parainfluenza Virus 4 NOT DETECTED NOT DETECTED Final   Respiratory Syncytial Virus NOT DETECTED NOT DETECTED Final   Bordetella pertussis NOT DETECTED NOT DETECTED Final   Bordetella Parapertussis NOT DETECTED NOT DETECTED Final   Chlamydophila pneumoniae NOT DETECTED NOT DETECTED Final   Mycoplasma pneumoniae NOT DETECTED NOT DETECTED Final    Comment: Performed at [facility], [place]. [address], [place], [place] [address]  MRSA  Next Gen by PCR, Nasal     Status: None   Collection Time: 05/03/22 11:24 AM   Specimen: Nasal Mucosa; Nasal Swab  Result Value Ref Range Status   MRSA by PCR Next Gen NOT DETECTED NOT DETECTED Final    Comment: (NOTE) The GeneXpert MRSA Assay (FDA approved for NASAL specimens only), is one component of a comprehensive MRSA colonization surveillance program. It is not intended to diagnose MRSA infection nor to guide or monitor treatment for MRSA infections. Test performance is not FDA approved in patients less than 56 years old. Performed at [facility], [address] [place], [place] [address]     Radiology Studies: DG Chest [place] 1 View  Result Date: 05/06/2022 CLINICAL DATA:  Pneumonia EXAM: PORTABLE CHEST 1 VIEW COMPARISON:  05/04/2022 FINDINGS: Normal cardiac silhouette. Low lung volumes. Fine airspace disease in the RIGHT upper lobe is slightly improved from comparison exam. Elevated LEFT hemidiaphragm. No acute osseous abnormality. IMPRESSION: 1. Slight improvement in RIGHT upper lobe pneumonia. 2. Low lung volumes. Electronically Signed   By: Suzy Bouchard M.D.   On: 05/06/2022 09:29    Scheduled Meds:  acetylcysteine  4 mL Nebulization BID   ARIPiprazole  20 mg Oral Daily   ascorbic acid  500 mg Oral BID   buPROPion  300 mg Oral Daily   carvedilol  25 mg Oral BID   Chlorhexidine Gluconate Cloth  6 each Topical Daily   cholecalciferol  1,000 Units Oral q AM   dextromethorphan  5 mL Oral Q12H   ferrous  sulfate  325 mg Oral Q breakfast   finasteride  5 mg Oral Daily   fluticasone furoate-vilanterol  1 puff Inhalation Daily   folic acid  1 mg Oral q AM   insulin aspart  0-15 Units Subcutaneous TID WC   ipratropium-albuterol  3 mL Nebulization Q6H   loratadine  10 mg Oral Daily   multivitamin with minerals  1 tablet Oral Daily   mouth rinse  15 mL Mouth Rinse 4 times per day   PHENObarbital  65 mg Intravenous QODAY   pravastatin  10 mg Oral QHS   sodium  chloride flush  3 mL Intravenous Q12H   umeclidinium bromide  1 puff Inhalation Daily   Continuous Infusions:  sodium chloride Stopped (05/07/22 1030)   cefTRIAXone (ROCEPHIN)  IV 1 g (05/07/22 0311)   heparin Stopped (05/07/22 1030)   potassium chloride       LOS: 5 days    Time spent: 35 mins    Shrita Thien, MD [facility]   If 7PM-7AM, please contact night-coverage

## 2022-05-07 NOTE — Consult Note (Addendum)
PHARMACY CONSULT NOTE - FOLLOW UP  Pharmacy Consult for Electrolyte Monitoring and Replacement   Recent Labs: Potassium (mmol/L)  Date Value  05/07/2022 2.9 (L)  10/03/2012 3.1 (L)   Magnesium (mg/dL)  Date Value  05/07/2022 2.4  10/02/2012 1.3 (L)   Calcium (mg/dL)  Date Value  05/07/2022 8.4 (L)   Calcium, Total (mg/dL)  Date Value  10/03/2012 8.2 (L)   Albumin (g/dL)  Date Value  05/02/2022 3.3 (L)  10/02/2012 3.4   Phosphorus (mg/dL)  Date Value  05/06/2022 3.3   Sodium (mmol/L)  Date Value  05/07/2022 142  10/03/2012 141    Assessment: 81 y.o. male with medical history significant of HTN, HLD, GERD, COPD, CHF (diastolic), seizure disorder, BPH who comes in today with presumed COPD exacerbation. Found to have a PE, started on heparin infusion. Potassium continues to be low  K 2.7>2.9 In response to additional 10 mEq IV x 4 ordered by provider last night  Goal of Therapy:  WNL  Plan: Give potassium chloride 10 mEq IV x 6 Will repeat K @ 2100 and then check Mg along with BMP tomorrow  Dara Hoyer, PharmD PGY-1 Pharmacy Resident 05/07/2022 10:45 AM

## 2022-05-07 NOTE — Progress Notes (Addendum)
SLP Cancellation Note  Patient Details Name: [name] MRN: [id number] DOB: June 29, 1941   Cancelled treatment:       Reason Eval/Treat Not Completed: Patient not medically ready;Medical issues which prohibited therapy (reviewed chart notes) Per chart notes, pt was agitated, screaming out this afternoon. NSG giving morphine w/ continued screaming out, per chart notes.  In setting of pt's Acute illness and decline in status including difficulty managing his own secretions, declined mental status, and his Chronic Dysphagia w/ previous recommendation for HONEY consistency liquids, recommend continue NPO status d/t HIGH risk for aspiration and Pulmonary decline.  Recommend continued f/u w/ Palliative Care for ongoing discussion of [place]; and his Dysphagia.  Recommend continued, frequent oral care for hygiene and stimulation of swallowing. ST services can be reconsulted if new swallowing needs arise during admit.       Orinda Kenner, MS, CCC-SLP Speech Language Pathologist Rehab Services; [place] [phone] (ascom) Brendan Gruwell 05/07/2022, 11:06 AM

## 2022-05-07 NOTE — Progress Notes (Signed)
Palliative:  Checked in with the bedside nursing staff.  Disorientation continues, poor insight into condition.  Please see note from 9/28 regarding patient's lack of capacity to make decisions.  No alternate decision-maker has been identified at this time.  PMT unable to move forward with goals of care discussions.  Juel Burrow, DNP, AGNP-C Palliative Medicine Team Team Phone # [phone]  Pager # [phone]   NO CHARGE

## 2022-05-07 NOTE — Consult Note (Signed)
PHARMACY CONSULT NOTE - FOLLOW UP  Pharmacy Consult for Electrolyte Monitoring and Replacement   Recent Labs: Potassium (mmol/L)  Date Value  05/07/2022 3.1 (L)  10/03/2012 3.1 (L)   Magnesium (mg/dL)  Date Value  05/07/2022 2.4  10/02/2012 1.3 (L)   Calcium (mg/dL)  Date Value  05/07/2022 8.4 (L)   Calcium, Total (mg/dL)  Date Value  10/03/2012 8.2 (L)   Albumin (g/dL)  Date Value  05/02/2022 3.3 (L)  10/02/2012 3.4   Phosphorus (mg/dL)  Date Value  05/06/2022 3.3   Sodium (mmol/L)  Date Value  05/07/2022 142  10/03/2012 141    Assessment: 81 y.o. male with medical history significant of HTN, HLD, GERD, COPD, CHF (diastolic), seizure disorder, BPH who comes in today with presumed COPD exacerbation. Found to have a PE, started on heparin infusion. Potassium continues to be low  K 2.7>2.9 In response to additional 10 mEq IV x 4 ordered by provider last night  Goal of Therapy:  WNL  Plan: K 3.1. Remains low despite repletion with potassium chloride 10 mEq IV x 6 Give 10 mEq IV x 6 (per MD order) Will recheck Mg along with BMP tomorrow  [place] Pharmacist 05/07/2022 6:54 PM

## 2022-05-07 NOTE — Progress Notes (Signed)
Cpt refused

## 2022-05-08 ENCOUNTER — Inpatient Hospital Stay: Payer: Medicare Other

## 2022-05-08 DIAGNOSIS — I2699 Other pulmonary embolism without acute cor pulmonale: Secondary | ICD-10-CM | POA: Diagnosis not present

## 2022-05-08 LAB — CBC
HCT: 35.7 % — ABNORMAL LOW (ref 39.0–52.0)
Hemoglobin: 11.4 g/dL — ABNORMAL LOW (ref 13.0–17.0)
MCH: 27.7 pg (ref 26.0–34.0)
MCHC: 31.9 g/dL (ref 30.0–36.0)
MCV: 86.9 fL (ref 80.0–100.0)
Platelets: 271 10*3/uL (ref 150–400)
RBC: 4.11 MIL/uL — ABNORMAL LOW (ref 4.22–5.81)
RDW: 16.1 % — ABNORMAL HIGH (ref 11.5–15.5)
WBC: 8.5 10*3/uL (ref 4.0–10.5)
nRBC: 0 % (ref 0.0–0.2)

## 2022-05-08 LAB — BASIC METABOLIC PANEL
Anion gap: 10 (ref 5–15)
BUN: 52 mg/dL — ABNORMAL HIGH (ref 8–23)
CO2: 17 mmol/L — ABNORMAL LOW (ref 22–32)
Calcium: 8.7 mg/dL — ABNORMAL LOW (ref 8.9–10.3)
Chloride: 118 mmol/L — ABNORMAL HIGH (ref 98–111)
Creatinine, Ser: 1.79 mg/dL — ABNORMAL HIGH (ref 0.61–1.24)
GFR, Estimated: 38 mL/min — ABNORMAL LOW (ref >60)
Glucose, Bld: 110 mg/dL — ABNORMAL HIGH (ref 70–99)
Potassium: 2.8 mmol/L — ABNORMAL LOW (ref 3.5–5.1)
Sodium: 145 mmol/L (ref 135–145)

## 2022-05-08 LAB — POTASSIUM: Potassium: 3.4 mmol/L — ABNORMAL LOW (ref 3.5–5.1)

## 2022-05-08 LAB — APTT: aPTT: 41 seconds — ABNORMAL HIGH (ref 24–36)

## 2022-05-08 LAB — GLUCOSE, CAPILLARY
Glucose-Capillary: 118 mg/dL — ABNORMAL HIGH (ref 70–99)
Glucose-Capillary: 107 mg/dL — ABNORMAL HIGH (ref 70–99)
Glucose-Capillary: 107 mg/dL — ABNORMAL HIGH (ref 70–99)
Glucose-Capillary: 93 mg/dL (ref 70–99)

## 2022-05-08 LAB — HEPARIN LEVEL (UNFRACTIONATED): Heparin Unfractionated: 0.4 IU/mL (ref 0.30–0.70)

## 2022-05-08 LAB — MAGNESIUM: Magnesium: 2.4 mg/dL (ref 1.7–2.4)

## 2022-05-08 MED ORDER — SODIUM CHLORIDE 0.9 % IV SOLN: INTRAVENOUS | Status: DC

## 2022-05-08 MED ORDER — DEXTROMETHORPHAN POLISTIREX ER 30 MG/5ML PO SUER
5.0000 mL | Freq: Two times a day (BID) | ORAL | Status: DC
  Administered 2022-05-08 – 2022-05-11 (×8): 30 mg
  Filled 2022-05-08 (×11): qty 5

## 2022-05-08 MED ORDER — LACTULOSE 10 GM/15ML PO SOLN
10.0000 g | Freq: Four times a day (QID) | ORAL | Status: DC | PRN
  Administered 2022-05-08: 10 g
  Filled 2022-05-08: qty 30

## 2022-05-08 MED ORDER — HEPARIN BOLUS VIA INFUSION
2650.0000 [IU] | Freq: Once | INTRAVENOUS | Status: DC
  Filled 2022-05-08: qty 2650

## 2022-05-08 MED ORDER — POTASSIUM CHLORIDE 10 MEQ/100ML IV SOLN
10.0000 meq | INTRAVENOUS | Status: AC
  Administered 2022-05-08 (×6): 10 meq via INTRAVENOUS
  Filled 2022-05-08 (×6): qty 100

## 2022-05-08 MED ORDER — CARVEDILOL 25 MG PO TABS
25.0000 mg | ORAL_TABLET | Freq: Two times a day (BID) | ORAL | Status: DC
  Administered 2022-05-08 – 2022-05-11 (×9): 25 mg
  Filled 2022-05-08 (×9): qty 1

## 2022-05-08 MED ORDER — POTASSIUM CHLORIDE 20 MEQ PO PACK
40.0000 meq | PACK | Freq: Once | ORAL | Status: AC
  Administered 2022-05-08: 40 meq
  Filled 2022-05-08: qty 2

## 2022-05-08 MED ORDER — VITAMIN C 500 MG PO TABS
500.0000 mg | ORAL_TABLET | Freq: Two times a day (BID) | ORAL | Status: DC
  Administered 2022-05-08 – 2022-05-11 (×9): 500 mg
  Filled 2022-05-08 (×9): qty 1

## 2022-05-08 MED ORDER — PRAVASTATIN SODIUM 20 MG PO TABS
10.0000 mg | ORAL_TABLET | Freq: Every day | ORAL | Status: DC
  Administered 2022-05-08 – 2022-05-10 (×4): 10 mg
  Filled 2022-05-08 (×5): qty 1

## 2022-05-08 NOTE — Progress Notes (Signed)
   PATIENT:[name]         TMH:[id number] DOB: 30-May-1941   Nurse concerned with abdominal tightness and high pitched bowel sounds  with NG drainage noted with blood  KUB  IMPRESSION: 1. Enteric tube side port projects over the distal esophagus. 2. Similar appearance of diffuse gaseous dilation of loops of bowel,possibly ileus. If concern for obstruction, recommend CT.   Exam  Abdomen tympanic bowel sounds taut firm shiny and pain with palpation. NGT drain brown slightly blood tinged  Lungs diminished CV SR, BP stable  Stat CT AP wo Contrast  IMPRESSION: 1. 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 2. Marked severity right lower lobe consolidation, consistent with pneumonia. 3. Numerous bilateral renal and proximal ureteral calculi of various sizes. 4. Multiple right renal cysts (Bosniak 2). No additional follow-up or imaging is recommended. This recommendation follows ACR consensus guidelines: Management of the Incidental Renal Mass on CT: A White Paper of the ACR Incidental Findings Committee. J Am Coll Radiol [phone]. 5. Moderate severity anasarca. 6. Aortic atherosclerosis.   Consult with Dr Peyton Najjar and Dr Virgina Jock.  Volvulus released through emergent colonoscopy procedure. No complications. Some bowel dilatation still remains post procedure xray Rectal tube placed during procedure and is not secure. Strict nursing instructions and demo to keep tube in place with turning  PNA on CT likely atelectasis from inability for deep breathing with abdominal pressure. No leukocytosis, no elevation in procal. No change in antibiotics IV fluids changed to D5 1/2 with 20 kcl based on latest labs and will need to be NPO for several days  Hampton Abbot NP [facility]

## 2022-05-08 NOTE — Consult Note (Signed)
PHARMACY CONSULT NOTE - FOLLOW UP  Pharmacy Consult for Electrolyte Monitoring and Replacement   Recent Labs: Potassium (mmol/L)  Date Value  05/08/2022 3.4 (L)  10/03/2012 3.1 (L)   Magnesium (mg/dL)  Date Value  05/08/2022 2.4  10/02/2012 1.3 (L)   Calcium (mg/dL)  Date Value  05/08/2022 8.7 (L)   Calcium, Total (mg/dL)  Date Value  10/03/2012 8.2 (L)   Albumin (g/dL)  Date Value  05/02/2022 3.3 (L)  10/02/2012 3.4   Phosphorus (mg/dL)  Date Value  05/06/2022 3.3   Sodium (mmol/L)  Date Value  05/08/2022 145  10/03/2012 141    Assessment: 81 y.o. male with medical history significant of HTN, HLD, GERD, COPD, CHF (diastolic), seizure disorder, BPH who comes in today with presumed COPD exacerbation. Found to have a PE, started on heparin infusion. Potassium continues to be low   Goal of Therapy:  WNL  Plan: K 3.4. Give Kcl 42mEq PO x1 Will recheck Mg along with BMP tomorrow morning   [place] Pharmacist 05/08/2022 6:23 PM

## 2022-05-08 NOTE — Consult Note (Signed)
SURGICAL CONSULTATION NOTE   HISTORY OF PRESENT ILLNESS (HPI):  81 y.o. male admitted since 05/02/2022 to [facility] ED for treatment of pulmonary embolism.  Patient presented with shortness of breath.  Upon work-up he was found with acute pulmonary embolism.  He was started on heparin drip.  Upon admission diagnoses he also was admitted with UTI.  Patient history of diastolic CHF.  Most of the history was taken from the chart.  Patient has been getting abdominal distention for the last few days.  I have been able to evaluate serial abdominal x-rays that shows persistent and worsening intestinal dilation.  He had a CT scan of the abdomen and pelvis done on 930 at 11:25 PM that shows a sigmoid volvulus.  No free air or free fluid.  On physical exam patient with severely distended abdomen but nontender to palpation.  No rebound tenderness.  No sign of acute abdomen or ischemia at this moment.  As per chart patient has been fluctuating in his mental status and disorientation.  Tonight patient unable to follow simple commands.  He was able to look at my eyes but did not participated responding to any of my questions.  Patient seen very debilitated with acute over chronic significant medical comorbidities.  Surgery is consulted by Randol Kern, NP in this context for evaluation and management of sigmoid volvulus.  PAST MEDICAL HISTORY (PMH):  Past Medical History:  Diagnosis Date   Bipolar 1 disorder ([place])    C. difficile diarrhea 05/2016   history of ...   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   Schizophrenia ([place])    Seizure ([place])    on phenobarb   Urinary  retention    frequently requiring foley placement   VRE (vancomycin resistant enterococcus) culture positive    urine     PAST SURGICAL HISTORY ([place]):  Past Surgical History:  Procedure Laterality Date   EXTRACORPOREAL SHOCK WAVE LITHOTRIPSY Right 05/05/2017   Procedure: EXTRACORPOREAL SHOCK WAVE LITHOTRIPSY (ESWL);  Surgeon: Hollice Espy, MD;  Location: [facility] ORS;  Service: Urology;  Laterality: Right;   IR CATHETER TUBE CHANGE  06/23/2017   IR CATHETER TUBE CHANGE  06/28/2017     MEDICATIONS:  Prior to Admission medications   Medication Sig Start Date End Date Taking? Authorizing Provider  ammonium lactate (AMLACTIN) 12 % cream Apply 1 Application topically every 12 (twelve) hours.   Yes [provider]  ARIPiprazole (ABILIFY) 20 MG tablet Take 20 mg by mouth in the morning.   Yes [provider]  ascorbic acid (VITAMIN C) 500 MG tablet Take 1 tablet (500 mg total) by mouth 2 (two) times daily. 01/16/22  Yes Nolberto Hanlon, MD  buPROPion (BUDEPRION XL) 300 MG 24 hr tablet Take 1 tablet (300 mg total) by mouth daily. 04/26/16  Yes Wieting, Richard, MD  carvedilol (COREG) 25 MG tablet Take 25 mg by mouth 2 (two) times daily. 04/27/22  Yes [provider]  cetirizine (ZYRTEC) 10 MG tablet Take 10 mg by mouth in the morning.   Yes [provider]  cholecalciferol (VITAMIN D3) 25 MCG (1000  UNIT) tablet Take 1,000 Units by mouth in the morning.   Yes [provider]  Cranberry 250 MG CAPS Take 250 mg by mouth 2 (two) times daily.   Yes [provider]  Dextromethorphan HBr 15 MG CAPS Take 2 capsules by mouth every 12 (twelve) hours.   Yes [provider]  ferrous sulfate 325 (65 FE) MG tablet Take 325 mg by mouth daily with breakfast.   Yes [provider]  finasteride (PROSCAR) 5 MG tablet Take 1 tablet (5 mg total) by mouth daily. 04/27/16  Yes Wieting, Richard, MD  folic acid (FOLVITE) 1 MG tablet Take 1 mg by mouth in the  morning.   Yes [provider]  furosemide (LASIX) 20 MG tablet Take 20 mg by mouth 2 (two) times daily. 04/27/22  Yes [provider]  lisinopril (ZESTRIL) 10 MG tablet Take 10 mg by mouth daily. 04/17/22  Yes [provider]  Multiple Vitamin (MULTI-VITAMINS) TABS Take 1 tablet by mouth daily.   Yes [provider]  PHENobarbital (LUMINAL) 64.8 MG tablet Take 64.8 mg by mouth every other day.   Yes [provider]  potassium chloride SA (KLOR-CON M) 20 MEQ tablet Take 20 mEq by mouth daily. 04/05/22  Yes [provider]  pravastatin (PRAVACHOL) 10 MG tablet Take 10 mg by mouth at bedtime. 04/28/22  Yes [provider]  Suvorexant 5 MG TABS Take 5 mg by mouth at bedtime.   Yes [provider]  zinc oxide 20 % ointment Apply 1 application. topically 2 (two) times daily. (Apply to legs)   Yes [provider]  albuterol (VENTOLIN HFA) 108 (90 Base) MCG/ACT inhaler Inhale 2 puffs into the lungs every 6 (six) hours as needed for wheezing or shortness of breath.    [provider]  tamsulosin (FLOMAX) 0.4 MG CAPS capsule Take 0.4 mg by mouth daily. Patient not taking: Reported on 05/02/2022 04/03/22   [provider]     ALLERGIES:  Allergies  Allergen Reactions   Penicillin G Rash and Other (See Comments)    Convulsions.    Has patient had a PCN reaction causing immediate rash, facial/tongue/throat swelling, SOB or lightheadedness with hypotension: not sure Has patient had a PCN reaction causing severe rash involving mucus membranes or skin necrosis: not sure Has patient had a PCN reaction that required hospitalization: not sure Has patient had a PCN reaction occurring within the last 10 years: not sure If all of the above answers are "NO", then may proceed with Cephalosporin use.     SOCIAL HISTORY:  Social History   Socioeconomic History   Marital status: Single    Spouse name: Not on file    Number of children: Not on file   Years of education: Not on file   Highest education level: Not on file  Occupational History   Not on file  Tobacco Use   Smoking status: Never    Passive exposure: Never   Smokeless tobacco: Never  Vaping Use   Vaping Use: Never used  Substance and Sexual Activity   Alcohol use: No   Drug use: No   Sexual activity: Never  Other Topics Concern   Not on file  Social History Narrative   Not on file   Social Determinants of Health   Financial Resource Strain: Not on file  Food Insecurity: Not on file  Transportation Needs: Not on file  Physical Activity: Not on file  Stress: Not on file  Social Connections: Not on file  Intimate Partner Violence: Not on file      FAMILY HISTORY:  Family History  Problem Relation Age of Onset   Cancer Mother    Prostate cancer Neg Hx    Kidney disease Neg Hx    Kidney cancer Neg Hx    Bladder Cancer Neg Hx      REVIEW OF SYSTEMS:  Review of system assessed but patient was unable to answer properly   VITAL SIGNS:  Temp:  [97.6 F (36.4 C)-98.5 F (36.9 C)] 97.6 F (36.4 C) (09/30 2318) Pulse Rate:  [84-90] 88 (09/30 2318) Resp:  [16-21] 17 (09/30 2318) BP: (116-143)/(67-96) 137/86 (09/30 2318) SpO2:  [90 %-98 %] 90 % (09/30 2318)     Height: 5\' 8"  (172.7 cm) Weight: 95 kg BMI (Calculated): 31.85   INTAKE/OUTPUT:  This shift: Total I/O In: 60 [NG/GT:60] Out: -   Last 2 shifts: @IOLAST2SHIFTS @   PHYSICAL EXAM:  Constitutional:  -- Disoriented, uncomfortable, acute or chronically debilitated Eyes:  -- Pupils equally round and reactive to light  -- No scleral icterus  Ear, nose, and throat:  -- No jugular venous distension  Pulmonary:  -- Decreased breath sounds Cardiovascular:  -- S1, S2 present  -- No pericardial rubs Gastrointestinal:  -- Abdomen soft, severe distention.  No guarding or rebound tenderness Musculoskeletal and Integumentary:  -- Wounds: None appreciated --  Extremities: Lateral lower extremity edema  Neurologic:  -- Motor function: intact and symmetric -- Sensation: intact and symmetric   Labs:     Latest Ref Rng & Units 05/08/2022    4:34 AM 05/07/2022    8:21 AM 05/06/2022    4:53 AM  CBC  WBC 4.0 - 10.5 K/uL 8.5  8.7  8.4   Hemoglobin 13.0 - 17.0 g/dL 11.4  11.3  12.0   Hematocrit 39.0 - 52.0 % 35.7  35.1  37.1   Platelets 150 - 400 K/uL 271  225  273       Latest Ref Rng & Units 05/08/2022    5:40 PM 05/08/2022    4:34 AM 05/07/2022    5:47 PM  CMP  Glucose 70 - 99 mg/dL  110    BUN 8 - 23 mg/dL  52    Creatinine 0.61 - 1.24 mg/dL  1.79    Sodium 135 - 145 mmol/L  145    Potassium 3.5 - 5.1 mmol/L 3.4  2.8  3.1   Chloride 98 - 111 mmol/L  118    CO2 22 - 32 mmol/L  17    Calcium 8.9 - 10.3 mg/dL  8.7       Imaging studies:  EXAM: CT ABDOMEN AND PELVIS WITHOUT CONTRAST   TECHNIQUE: Multidetector CT imaging of the abdomen and pelvis was performed following the standard protocol without IV contrast.   RADIATION DOSE REDUCTION: This exam was performed according to the departmental dose-optimization program which includes automated exposure control, adjustment of the mA and/or kV according to patient size and/or use of iterative reconstruction technique.   COMPARISON:  March 25, 2022   FINDINGS: Lower chest: Marked severity consolidation is seen within the posterior aspect of the right lung base. Mild posterolateral left basilar atelectasis is noted.   Hepatobiliary: No focal liver abnormality is seen. The gallbladder is contracted without evidence of gallstones, gallbladder wall thickening, or biliary dilatation.   Pancreas: Unremarkable. No pancreatic ductal dilatation or surrounding inflammatory changes.   Spleen: Normal in size without focal abnormality.  Adrenals/Urinary Tract: Adrenal glands are unremarkable. Kidneys are normal in size with mild bilateral renal cortical thinning noted. Multiple cysts  are seen within the right kidney. The largest measures approximately 6.9 cm x 4.9 cm. Numerous bilateral renal and proximal ureteral calculi of various sizes are seen. The largest on the right measures approximately 1.3 cm, while the largest on the left measures 1.0 cm. A suprapubic catheter is seen within an empty urinary bladder.   Stomach/Bowel: A nasogastric tube is seen with its distal tip noted within the body of the stomach. Numerous markedly dilated large bowel loops are seen throughout the abdomen and pelvis. A transition zone is seen within the mid sigmoid colon. Twisting of the mesentery is noted within this region (axial CT images 78 through 87, CT series 2). The small bowel loops are predominantly decompressed. The appendix is normal in appearance.   Vascular/Lymphatic: Aortic atherosclerosis. No enlarged abdominal or pelvic lymph nodes.   Reproductive: The prostate gland is not identified.   Other: There is moderate severity anasarca involving predominantly the posterolateral aspects of the abdominal and pelvic walls.   No abdominopelvic ascites.   Musculoskeletal: Marked severity multilevel degenerative changes seen throughout the lumbar spine.   IMPRESSION: 1. 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 2. Marked severity right lower lobe consolidation, consistent with pneumonia. 3. Numerous bilateral renal and proximal ureteral calculi of various sizes. 4. Multiple right renal cysts (Bosniak 2). No additional follow-up or imaging is recommended. This recommendation follows ACR consensus guidelines: Management of the Incidental Renal Mass on CT: A White Paper of the ACR Incidental Findings Committee. J Am Coll Radiol [phone]. 5. Moderate severity anasarca. 6. Aortic atherosclerosis.     Electronically Signed   By: Virgina Norfolk M.D.   On: 05/08/2022  23:25  Assessment/Plan:  81 y.o. male with single problems, complicated by pertinent comorbidities including acute pulmonary embolism on heparin drip, aspiration pneumonia on antibiotic therapy, diastolic CHF, seizure disorder, hypokalemia, COPD.  Patient with progressive dilation due to sigmoid volvulus.  Physical exam without acute abdomen.  -Patient has been many severe acute or chronic medical comorbidities.  These include acute pulmonary embolism on heparin drip, aspiration pneumonia.  I do not think that this patient will tolerate general anesthesia.  Patient extremely high risk for surgical intervention.  In addition to that upon evaluation of the chart patient has been evaluated by palliative care and he was found not capable of making any decision they had not been able to at least define any decision maker for this patient.  This may be situational even more complicated.  I discussed care with the nurse practitioner covering the hospitalist service.  I think that ethics committee know any pertinent community should get involved to make a decision in this patient she will be considered hospice and comfort measures versus proceeding with invasive interventions.  Again I feel that this patient would not tolerate a big surgical intervention such as partial colectomy with colostomy creation under general anesthesia.  Consider consult to GI for them to make assessment if patient is a candidate for endoscopic decompression of the sigmoid volvulus.  At this moment there is no indication for emergent surgical intervention.  I will follow up closely.   Arnold Long, MD

## 2022-05-08 NOTE — Consult Note (Signed)
PHARMACY CONSULT NOTE - FOLLOW UP  Pharmacy Consult for Electrolyte Monitoring and Replacement   Recent Labs: Potassium (mmol/L)  Date Value  05/08/2022 2.8 (L)  10/03/2012 3.1 (L)   Magnesium (mg/dL)  Date Value  05/08/2022 2.4  10/02/2012 1.3 (L)   Calcium (mg/dL)  Date Value  05/08/2022 8.7 (L)   Calcium, Total (mg/dL)  Date Value  10/03/2012 8.2 (L)   Albumin (g/dL)  Date Value  05/02/2022 3.3 (L)  10/02/2012 3.4   Phosphorus (mg/dL)  Date Value  05/06/2022 3.3   Sodium (mmol/L)  Date Value  05/08/2022 145  10/03/2012 141    Assessment: 81 y.o. male with medical history significant of HTN, HLD, GERD, COPD, CHF (diastolic), seizure disorder, BPH who comes in today with presumed COPD exacerbation. Found to have a PE, started on heparin infusion. Potassium continues to be low   Goal of Therapy:  WNL  Plan: K 2.8. Remains low despite repletion with potassium chloride 10 mEq IV x 6 Give 10 mEq IV x 6  Will recheck K level at 1800 Will recheck Mg along with BMP tomorrow  [place] Pharmacist 05/08/2022 2:17 PM

## 2022-05-08 NOTE — Consult Note (Addendum)
ANTICOAGULATION CONSULT NOTE - Initial Consult  Pharmacy Consult for heparin infusion Indication: pulmonary embolus  Allergies  Allergen Reactions   Penicillin G Rash and Other (See Comments)    Convulsions.    Has patient had a PCN reaction causing immediate rash, facial/tongue/throat swelling, SOB or lightheadedness with hypotension: not sure Has patient had a PCN reaction causing severe rash involving mucus membranes or skin necrosis: not sure Has patient had a PCN reaction that required hospitalization: not sure Has patient had a PCN reaction occurring within the last 10 years: not sure If all of the above answers are "NO", then may proceed with Cephalosporin use.    Patient Measurements: Height: 5\' 8"  (172.7 cm) Weight: 95 kg (209 lb 7 oz) IBW/kg (Calculated) : 68.4 Heparin Dosing Weight: 88.3 kg   Vital Signs: Temp: 98.5 F (36.9 C) (09/30 0504) Temp Source: Oral (09/30 0504) BP: 116/67 (09/30 0504) Pulse Rate: 88 (09/30 0504)  Labs: Recent Labs    05/06/22 0453 05/06/22 1310 05/07/22 0000 05/07/22 0821 05/08/22 0434  HGB 12.0*  --   --  11.3* 11.4*  HCT 37.1*  --   --  35.1* 35.7*  PLT 273  --   --  225 271  APTT 82*   < > 81* 94* 41*  HEPARINUNFRC >1.10*  --   --  >1.10* 0.40  CREATININE 2.10*  --   --  2.07* 1.79*   < > = values in this interval not displayed.     Estimated Creatinine Clearance: 37.4 mL/min (A) (by C-G formula based on SCr of 1.79 mg/dL (H)).   Medical History: Past Medical History:  Diagnosis Date   Bipolar 1 disorder ([facility])    C. difficile diarrhea 05/2016   history of ...   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/t  frequent foley catheters   Schizophrenia ([facility])    Seizure ([facility])    on phenobarb   Urinary retention    frequently requiring foley placement   VRE (vancomycin resistant enterococcus) culture positive    urine    Medications:  Apixaban 10 mg x 1 dose 9/26 @ 1039  Assessment: 81 y.o. male with medical history significant of HTN, HLD, GERD, COPD, CHF (diastolic), seizure disorder presented to ED 05/02/22 with hypoxia and wheezing. CT chest showing PE. Patient initially started on oral therapy with apixaban and now strict NPO because of significant risk of aspiration. Pharmacy consulted to manage heparin while patient NPO. Last dose of apixaban was 9/26 @ 1039. Hgb, Hct, PLT stable.  Goal of Therapy:  Heparin level 0.3-0.7 units/ml aPTT 66-102 seconds Monitor platelets by anticoagulation protocol: Yes  Date Time aPTT Rate/Comment 9/28 0453 82 Therapeutic x 1; 1500 un/hr 9/28 1335 62 Subtherapeutic; 1500 >> 1650 un/hr  9/29     0000    81        Therapeutic X 1; 1650 un/hr  9/29 0821 94 Therapeutic x 2; 1650 un/hr 9/30     0434   41        SUBtherapeutic   Date Time HL Rate/Comment 9/28 0453 >1.10 HL and aPTT  not correlating, so still titrate by aPTT 9/29 0821 >1.10 HL and aPTT not correlating, so still titrate by aPTT      Baseline Labs: aPTT - 30; INR - 1.2 Hgb - 13.7; Plts - 273   Plan: 9/30 @ 0434:  aPTT = 41,  HL = 0.40 - spoke with RN who stated heparin gtt was turned off on 9/29 @ 1000 due to hematuria.   RN also stated pt now has NG tube back in place so could possibly restart Eliquis.  Will need to f/u with provider on 9/30.   Ireland Chagnon D 05/08/2022 5:14 AM

## 2022-05-08 NOTE — Progress Notes (Signed)
Vest cpt declined but pt did perform 10 repetitions on Flutter valve. Congested, non-prod cough

## 2022-05-08 NOTE — Progress Notes (Signed)
PROGRESS NOTE    [name]  [id number] DOB: [birth year] DOA: 05/02/2022 PCP: Rica Koyanagi, MD   Brief Narrative:  This 81 y.o. male with medical history significant of HTN, HLD, GERD, COPD, CHF (diastolic), seizure disorder, BPH who comes in ED with presumed COPD exacerbation.  Per the EDP he was wheezing on admission and hypoxic.  EMS had to put him on CPAP to get his oxygen saturations up.  His blood gases were essentially normal.  He was weaned down to nasal cannula.  His chest x-ray was negative.  COVID was also negative. CT chest with contrast Filling defects within right lower lobe pulmonary arterial branches compatible with pulmonary emboli. Associated right basilar airspace opacity could reflect atelectasis or infarcts.Elevation of the right hemidiaphragm.  Patient is admitted for acute pulmonary embolism, started on IV heparin.  Patient has significant risk of aspiration.  Speech and swallow recommended n.p.o. with alternate means of feeding.  Palliative care consulted to discuss goals of care.  Patient has no capacity to make decision.  No alternate decision-maker has been identified at this time.  Pulmonology consulted.  Assessment & Plan:   Principal Problem:   Acute pulmonary embolism (HCC) Active Problems:   UTI (urinary tract infection)   Chronic diastolic CHF (congestive heart failure) (HCC)   Seizure disorder (HCC)   Depression   Hypertension   Mixed hyperlipidemia   BPH (benign prostatic hyperplasia)   Normocytic anemia   Hypokalemia   DVT of lower extremity, bilateral (HCC)   Venous insufficiency of both lower extremities   COPD exacerbation (HCC)   Malnutrition of moderate degree   Acute deep vein thrombosis (DVT) of left lower extremity (HCC)   Acute respiratory failure with hypoxia (HCC)  Acute pulmonary embolism / Acute Right LE DVT:  Patient found to have acute pulmonary embolism and acute Right LE DVT He was started on heparin gtt. Likely primary  cause for his respiratory distress and oxygen requirements. Continue supplemental oxygen as needed. He was successfully transitioned to Eliquis, now put back on heparin IV due to n.p.o. and high aspiration risk.  UTI in the setting of chronic suprapubic catheter: Patient has indwelling suprapubic catheter and urine had a lot of sediment in it. Suprapubic catheter was changed  (05/03/22) It was changed on August 25th 2023 at urology office Remains afebrile without leukocytosis.   Started on antibiotic for possible aspiration pneumonia.  Aspiration pneumonia: Patient is started on IV antibiotics.   Chest x-ray shows improving infiltrate. Continue to monitor clinical status. NG tube inserted for medications and for feeding.   Chronic diastolic CHF: Continue ACE inhibitor. Continue carvedilol Continue Lasix as needed BNP is slightly elevated but chest x-ray does not show fluid overload.   Seizure disorder (HCC) Continue phenobarbital.   Depression Continue Wellbutrin, Abilify.  Hypokalemia: Replacement in progress.  Continue to monitor   Hypertension Continue lisinopril, carvedilol.   Mixed hyperlipidemia Continue Pravachol.   BPH (benign prostatic hyperplasia) Continue Proscar.   Normocytic anemia Hemoglobin is stable.   COPD: Currently no wheezing. Continue inhalers.   Venous insufficiency of both lower extremities Continue Lasix as needed   Dysphagia with high aspiration risk: Speech and swallow evaluation completed, high risk for aspiration recommended NPO. Patient was empirically started on ceftriaxone for aspiration pneumonia. Palliative care consulted to discuss goals of care, alternate means of feeding. Insert NG tube for feeding and for medications  Obesity: Estimated body mass index is 31.84 kg/m as calculated from the following:   Height  as of this encounter: 5\' 8"  (1.727 m).   Weight as of this encounter: 95 kg.      DVT prophylaxis:  Eliquis Code Status: Full code Family Communication: No family at bedside Disposition Plan:   Status is: Inpatient Remains inpatient appropriate because: Admitted for acute PE, acute right LE DVT started on Eliquis.  Patient has dysphagia with high aspiration risk,  Palliative care consulted to discuss goals of care, alternate means of feeding. Patient has no capacity to make decision.  No alternate decision-maker has been identified at this time.  Consultants:  Palliative care  Procedures: None Antimicrobials:  Anti-infectives (From admission, onward)    Start     Dose/Rate Route Frequency Ordered Stop   05/05/22 0215  cefTRIAXone (ROCEPHIN) 1 g in sodium chloride 0.9 % 100 mL IVPB        1 g 200 mL/hr over 30 Minutes Intravenous Every 24 hours 05/05/22 0118 05/10/22 0214       Subjective: Patient was seen and examined at bedside.  Overnight events noted.   Patient is awake, following commands, but has no capacity to make decisions about goals of care. Patient continued to remain confused.  NG tube inserted for feeding and for medications. Patient is at high risk for aspiration.  He remains on antibiotics.  Objective: Vitals:   05/08/22 0743 05/08/22 0801 05/08/22 1201 05/08/22 1405  BP: 120/74  (!) 135/92   Pulse: 90  84   Resp: 19  19   Temp: 98 F (36.7 C)     TempSrc:      SpO2: 95% 96% 96% 95%  Weight:      Height:        Intake/Output Summary (Last 24 hours) at 05/08/2022 1433 Last data filed at 05/08/2022 1055 Gross per 24 hour  Intake 680 ml  Output 900 ml  Net -220 ml   Filed Weights   05/02/22 2102 05/03/22 0842  Weight: 102.5 kg 95 kg    Examination:  General exam: Appears comfortable, NAD, deconditioned. NG tube noted. Respiratory system: Decreased breath sounds, respiratory effort normal.  RR 16 Cardiovascular system: S1 & S2 heard, regular rate and rhythm, no murmur.   Gastrointestinal system: Abdomen is distended, non tender, BS+ Central  nervous system: Alert and oriented X 1 . No focal neurological deficits. Extremities: No edema, no cyanosis, no clubbing. Skin: No rashes, lesions or ulcers Psychiatry: Mood & affect appropriate.     Data Reviewed: I have personally reviewed following labs and imaging studies  CBC: Recent Labs  Lab 05/02/22 2121 05/03/22 0406 05/04/22 0448 05/05/22 [id number] 05/06/22 0453 05/07/22 0821 05/08/22 0434  WBC 8.1   < > 9.6 10.4 8.4 8.7 8.5  NEUTROABS 6.3  --   --   --   --   --   --   HGB 13.7   < > 11.7* 12.0* 12.0* 11.3* 11.4*  HCT 43.1   < > 35.3* 37.1* 37.1* 35.1* 35.7*  MCV 86.7   < > 84.7 84.7 85.9 86.2 86.9  PLT 273   < > 270 292 273 225 271   < > = values in this interval not displayed.   Basic Metabolic Panel: Recent Labs  Lab 05/04/22 0448 05/04/22 1329 05/05/22 [id number] 05/05/22 1343 05/06/22 0453 05/06/22 1737 05/07/22 0821 05/07/22 1747 05/08/22 0434  NA 142  --  143  --  143  --  142  --  145  K 2.3*   < > 2.4*   < >  2.4* 2.7* 2.9* 3.1* 2.8*  CL 111  --  114*  --  113*  --  117*  --  118*  CO2 24  --  23  --  22  --  21*  --  17*  GLUCOSE 141*  --  173*  --  127*  --  108*  --  110*  BUN 31*  --  40*  --  49*  --  54*  --  52*  CREATININE 1.57*  --  2.03*  --  2.10*  --  2.07*  --  1.79*  CALCIUM 8.6*  --  8.7*  --  8.5*  --  8.4*  --  8.7*  MG 2.1  --  2.5*  --  2.3  --  2.4  --  2.4  PHOS  --   --   --   --  3.3  --   --   --   --    < > = values in this interval not displayed.   GFR: Estimated Creatinine Clearance: 37.4 mL/min (A) (by C-G formula based on SCr of 1.79 mg/dL (H)). Liver Function Tests: Recent Labs  Lab 05/02/22 2121  AST 16  ALT 10  ALKPHOS 100  BILITOT 0.9  PROT 8.1  ALBUMIN 3.3*   No results for input(s): "LIPASE", "AMYLASE" in the last 168 hours. No results for input(s): "AMMONIA" in the last 168 hours. Coagulation Profile: Recent Labs  Lab 05/02/22 2121  INR 1.2   Cardiac Enzymes: No results for input(s): "CKTOTAL",  "CKMB", "CKMBINDEX", "TROPONINI" in the last 168 hours. BNP (last 3 results) No results for input(s): "PROBNP" in the last 8760 hours. HbA1C: No results for input(s): "HGBA1C" in the last 72 hours.  CBG: Recent Labs  Lab 05/07/22 0909 05/07/22 1138 05/07/22 2142 05/08/22 0741 05/08/22 1218  GLUCAP 111* 118* 102* 118* 107*   Lipid Profile: No results for input(s): "CHOL", "HDL", "LDLCALC", "TRIG", "CHOLHDL", "LDLDIRECT" in the last 72 hours. Thyroid Function Tests: No results for input(s): "TSH", "T4TOTAL", "FREET4", "T3FREE", "THYROIDAB" in the last 72 hours.  Anemia Panel: No results for input(s): "VITAMINB12", "FOLATE", "FERRITIN", "TIBC", "IRON", "RETICCTPCT" in the last 72 hours. Sepsis Labs: Recent Labs  Lab 05/02/22 2121 05/02/22 2319 05/06/22 0448  PROCALCITON  --   --  <0.10  LATICACIDVEN 1.5 0.4*  --     Recent Results (from the past 240 hour(s))  SARS Coronavirus 2 by RT PCR (hospital order, performed in [facility] hospital lab) *cepheid single result test* Anterior Nasal Swab     Status: None   Collection Time: 05/02/22  9:21 PM   Specimen: Anterior Nasal Swab  Result Value Ref Range Status   SARS Coronavirus 2 by RT PCR NEGATIVE NEGATIVE Final    Comment: (NOTE) SARS-CoV-2 target nucleic acids are NOT DETECTED.  The SARS-CoV-2 RNA is generally detectable in upper and lower respiratory specimens during the acute phase of infection. The lowest concentration of SARS-CoV-2 viral copies this assay can detect is 250 copies / mL. A negative result does not preclude SARS-CoV-2 infection and should not be used as the sole basis for treatment or other patient management decisions.  A negative result may occur with improper specimen collection / handling, submission of specimen other than nasopharyngeal swab, presence of viral mutation(s) within the areas targeted by this assay, and inadequate number of viral copies (<250 copies / mL). A negative result must be  combined with clinical observations, patient history, and epidemiological information.  Fact Sheet for Patients:   https://www.patel.info/  Fact Sheet for Healthcare Providers: https://hall.com/  This test is not yet approved or  cleared by the [place] FDA and has been authorized for detection and/or diagnosis of SARS-CoV-2 by FDA under an Emergency Use Authorization (EUA).  This EUA will remain in effect (meaning this test can be used) for the duration of the COVID-19 declaration under Section 564(b)(1) of the Act, 21 U.S.C. section 360bbb-3(b)(1), unless the authorization is terminated or revoked sooner.  Performed at [facility], [place], [place] [address]   Respiratory (~20 pathogens) panel by PCR     Status: None   Collection Time: 05/03/22  1:18 AM   Specimen: Nasopharyngeal Swab; Respiratory  Result Value Ref Range Status   Adenovirus NOT DETECTED NOT DETECTED Final   Coronavirus 229E NOT DETECTED NOT DETECTED Final    Comment: (NOTE) The Coronavirus on the Respiratory Panel, DOES NOT test for the novel  Coronavirus (2019 nCoV)    Coronavirus HKU1 NOT DETECTED NOT DETECTED Final   Coronavirus NL63 NOT DETECTED NOT DETECTED Final   Coronavirus OC43 NOT DETECTED NOT DETECTED Final   Metapneumovirus NOT DETECTED NOT DETECTED Final   Rhinovirus / Enterovirus NOT DETECTED NOT DETECTED Final   Influenza A NOT DETECTED NOT DETECTED Final   Influenza B NOT DETECTED NOT DETECTED Final   Parainfluenza Virus 1 NOT DETECTED NOT DETECTED Final   Parainfluenza Virus 2 NOT DETECTED NOT DETECTED Final   Parainfluenza Virus 3 NOT DETECTED NOT DETECTED Final   Parainfluenza Virus 4 NOT DETECTED NOT DETECTED Final   Respiratory Syncytial Virus NOT DETECTED NOT DETECTED Final   Bordetella pertussis NOT DETECTED NOT DETECTED Final   Bordetella Parapertussis NOT DETECTED NOT DETECTED Final   Chlamydophila  pneumoniae NOT DETECTED NOT DETECTED Final   Mycoplasma pneumoniae NOT DETECTED NOT DETECTED Final    Comment: Performed at [facility], [place]. [address], [place], [place] [address]  MRSA Next Gen by PCR, Nasal     Status: None   Collection Time: 05/03/22 11:24 AM   Specimen: Nasal Mucosa; Nasal Swab  Result Value Ref Range Status   MRSA by PCR Next Gen NOT DETECTED NOT DETECTED Final    Comment: (NOTE) The GeneXpert MRSA Assay (FDA approved for NASAL specimens only), is one component of a comprehensive MRSA colonization surveillance program. It is not intended to diagnose MRSA infection nor to guide or monitor treatment for MRSA infections. Test performance is not FDA approved in patients less than 41 years old. Performed at [facility], [address] [place], [place] [address]     Radiology Studies: DG Abd 1 View  Result Date: 05/08/2022 CLINICAL DATA:  [id number] NG tube placement EXAM: ABDOMEN - 1 VIEW COMPARISON:  May 08, 2022 FINDINGS: Incomplete visualization of the abdomen laterally and the pelvis. Carina is not visualized, limiting evaluation. Enteric tube tip projects over the expected region of the proximal stomach. Side port projects over the region of the distal esophagus. Marked gaseous dilation of loops of bowel, similar in comparison to prior. IMPRESSION: 1. Enteric tube side port projects over the distal esophagus. 2. Similar appearance of diffuse gaseous dilation of loops of bowel, possibly ileus. If concern for obstruction, recommend CT. Electronically Signed   By: Valentino Saxon M.D.   On: 05/08/2022 12:08   DG Abd 1 View  Result Date: 05/08/2022 CLINICAL DATA:  Check gastric catheter placement EXAM: ABDOMEN - 1 VIEW COMPARISON:  Film  from the previous day. FINDINGS: Gastric catheter is again noted in the stomach. Scattered large and small bowel gas is again seen and stable. IMPRESSION: Gastric catheter in the stomach. Electronically Signed   By:  Inez Catalina M.D.   On: 05/08/2022 00:32   DG Abd 1 View  Result Date: 05/07/2022 CLINICAL DATA:  Check gastric catheter placement EXAM: ABDOMEN - 1 VIEW COMPARISON:  None Available. FINDINGS: Gastric catheter is noted within the stomach. Scattered large and small bowel gas is again identified. This likely represents an ileus. Correlate with clinical exam. Degenerative changes of lumbar spine are noted. IMPRESSION: Gastric catheter in place. Considerable gaseous distension of the bowel which may represent an ileus. Electronically Signed   By: Inez Catalina M.D.   On: 05/07/2022 19:32    Scheduled Meds:  acetylcysteine  4 mL Nebulization BID   ARIPiprazole  20 mg Per Tube Daily   ascorbic acid  500 mg Per Tube BID   buPROPion  150 mg Per Tube BID   carvedilol  25 mg Per Tube BID   Chlorhexidine Gluconate Cloth  6 each Topical Daily   cholecalciferol  1,000 Units Per Tube q AM   dextromethorphan  5 mL Per Tube Q12H   ferrous sulfate  325 mg Per Tube Q breakfast   finasteride  5 mg Oral Daily   fluticasone furoate-vilanterol  1 puff Inhalation Daily   folic acid  1 mg Per Tube q AM   insulin aspart  0-15 Units Subcutaneous TID WC   ipratropium-albuterol  3 mL Nebulization Q6H   loratadine  10 mg Per Tube Daily   multivitamin with minerals  1 tablet Per Tube Daily   mouth rinse  15 mL Mouth Rinse 4 times per day   PHENObarbital  65 mg Intravenous QODAY   pravastatin  10 mg Per Tube QHS   sodium chloride flush  3 mL Intravenous Q12H   umeclidinium bromide  1 puff Inhalation Daily   Continuous Infusions:  sodium chloride Stopped (05/07/22 1030)   cefTRIAXone (ROCEPHIN)  IV 1 g (05/08/22 0115)     LOS: 6 days    Time spent: 35 mins    Desman Polak, MD [facility]   If 7PM-7AM, please contact night-coverage

## 2022-05-09 ENCOUNTER — Inpatient Hospital Stay: Payer: Medicare Other | Admitting: General Practice

## 2022-05-09 ENCOUNTER — Encounter
Admission: EM | Disposition: A | Discharge status: Skilled Nursing Facility | Payer: Self-pay | Source: Skilled Nursing Facility | Attending: Family Medicine

## 2022-05-09 ENCOUNTER — Inpatient Hospital Stay: Payer: Medicare Other

## 2022-05-09 ENCOUNTER — Encounter: Payer: Self-pay | Admitting: Internal Medicine

## 2022-05-09 DIAGNOSIS — I2699 Other pulmonary embolism without acute cor pulmonale: Secondary | ICD-10-CM | POA: Diagnosis not present

## 2022-05-09 HISTORY — PX: COLONOSCOPY WITH PROPOFOL: SHX5780

## 2022-05-09 LAB — CBC WITH DIFFERENTIAL/PLATELET
Abs Immature Granulocytes: 0.03 10*3/uL (ref 0.00–0.07)
Basophils Absolute: 0 10*3/uL (ref 0.0–0.1)
Basophils Relative: 0 %
Eosinophils Absolute: 0.1 10*3/uL (ref 0.0–0.5)
Eosinophils Relative: 1 %
HCT: 37.1 % — ABNORMAL LOW (ref 39.0–52.0)
Hemoglobin: 11.6 g/dL — ABNORMAL LOW (ref 13.0–17.0)
Immature Granulocytes: 0 %
Lymphocytes Relative: 19 %
Lymphs Abs: 1.7 10*3/uL (ref 0.7–4.0)
MCH: 27.6 pg (ref 26.0–34.0)
MCHC: 31.3 g/dL (ref 30.0–36.0)
MCV: 88.1 fL (ref 80.0–100.0)
Monocytes Absolute: 0.5 10*3/uL (ref 0.1–1.0)
Monocytes Relative: 6 %
Neutro Abs: 6.4 10*3/uL (ref 1.7–7.7)
Neutrophils Relative %: 74 %
Platelets: 272 10*3/uL (ref 150–400)
RBC: 4.21 MIL/uL — ABNORMAL LOW (ref 4.22–5.81)
RDW: 16.2 % — ABNORMAL HIGH (ref 11.5–15.5)
WBC: 8.8 10*3/uL (ref 4.0–10.5)
nRBC: 0 % (ref 0.0–0.2)

## 2022-05-09 LAB — GLUCOSE, CAPILLARY
Glucose-Capillary: 101 mg/dL — ABNORMAL HIGH (ref 70–99)
Glucose-Capillary: 117 mg/dL — ABNORMAL HIGH (ref 70–99)
Glucose-Capillary: 122 mg/dL — ABNORMAL HIGH (ref 70–99)
Glucose-Capillary: 156 mg/dL — ABNORMAL HIGH (ref 70–99)

## 2022-05-09 LAB — COMPREHENSIVE METABOLIC PANEL
ALT: 9 U/L (ref 0–44)
AST: 10 U/L — ABNORMAL LOW (ref 15–41)
Albumin: 2.7 g/dL — ABNORMAL LOW (ref 3.5–5.0)
Alkaline Phosphatase: 64 U/L (ref 38–126)
Anion gap: 8 (ref 5–15)
BUN: 50 mg/dL — ABNORMAL HIGH (ref 8–23)
CO2: 18 mmol/L — ABNORMAL LOW (ref 22–32)
Calcium: 8.9 mg/dL (ref 8.9–10.3)
Chloride: 121 mmol/L — ABNORMAL HIGH (ref 98–111)
Creatinine, Ser: 1.85 mg/dL — ABNORMAL HIGH (ref 0.61–1.24)
GFR, Estimated: 37 mL/min — ABNORMAL LOW (ref >60)
Glucose, Bld: 107 mg/dL — ABNORMAL HIGH (ref 70–99)
Potassium: 3.2 mmol/L — ABNORMAL LOW (ref 3.5–5.1)
Sodium: 147 mmol/L — ABNORMAL HIGH (ref 135–145)
Total Bilirubin: 0.9 mg/dL (ref 0.3–1.2)
Total Protein: 6.7 g/dL (ref 6.5–8.1)

## 2022-05-09 LAB — HEPARIN LEVEL (UNFRACTIONATED): Heparin Unfractionated: 0.14 IU/mL — ABNORMAL LOW (ref 0.30–0.70)

## 2022-05-09 LAB — PROCALCITONIN: Procalcitonin: <0.1 ng/mL

## 2022-05-09 LAB — LACTIC ACID, PLASMA: Lactic Acid, Venous: 0.9 mmol/L (ref 0.5–1.9)

## 2022-05-09 LAB — PHOSPHORUS: Phosphorus: 3.7 mg/dL (ref 2.5–4.6)

## 2022-05-09 LAB — PROTIME-INR
INR: 1.3 — ABNORMAL HIGH (ref 0.8–1.2)
Prothrombin Time: 16.1 seconds — ABNORMAL HIGH (ref 11.4–15.2)

## 2022-05-09 LAB — POTASSIUM: Potassium: 3.2 mmol/L — ABNORMAL LOW (ref 3.5–5.1)

## 2022-05-09 LAB — MAGNESIUM: Magnesium: 2.4 mg/dL (ref 1.7–2.4)

## 2022-05-09 LAB — APTT: aPTT: 35 seconds (ref 24–36)

## 2022-05-09 SURGERY — COLONOSCOPY WITH PROPOFOL
Anesthesia: General

## 2022-05-09 MED ORDER — PROPOFOL 10 MG/ML IV BOLUS
INTRAVENOUS | Status: AC
  Filled 2022-05-09: qty 20

## 2022-05-09 MED ORDER — PROPOFOL 500 MG/50ML IV EMUL
INTRAVENOUS | Status: DC | PRN
  Administered 2022-05-09: 75 ug/kg/min via INTRAVENOUS

## 2022-05-09 MED ORDER — POTASSIUM CHLORIDE 10 MEQ/100ML IV SOLN
10.0000 meq | Freq: Once | INTRAVENOUS | Status: AC
  Administered 2022-05-09: 10 meq via INTRAVENOUS
  Filled 2022-05-09: qty 100

## 2022-05-09 MED ORDER — POTASSIUM CHLORIDE 20 MEQ PO PACK
40.0000 meq | PACK | Freq: Once | ORAL | Status: AC
  Administered 2022-05-09: 40 meq
  Filled 2022-05-09: qty 2

## 2022-05-09 MED ORDER — EPHEDRINE 5 MG/ML INJ
INTRAVENOUS | Status: AC
  Filled 2022-05-09: qty 5

## 2022-05-09 MED ORDER — KCL IN DEXTROSE-NACL 20-5-0.45 MEQ/L-%-% IV SOLN
INTRAVENOUS | Status: DC
  Filled 2022-05-09 (×9): qty 1000

## 2022-05-09 MED ORDER — FREE WATER: 165.0000 mL | Status: DC

## 2022-05-09 MED ORDER — OSMOLITE 1.5 CAL PO LIQD: 1000.0000 mL | ORAL | Status: DC

## 2022-05-09 MED ORDER — PROSOURCE TF20 ENFIT COMPATIBL EN LIQD
60.0000 mL | Freq: Every day | ENTERAL | Status: DC
  Filled 2022-05-09: qty 60

## 2022-05-09 MED ORDER — SODIUM CHLORIDE 0.9 % IV SOLN: INTRAVENOUS | Status: DC

## 2022-05-09 MED ORDER — PHENYLEPHRINE HCL (PRESSORS) 10 MG/ML IV SOLN
INTRAVENOUS | Status: DC | PRN
  Administered 2022-05-09 (×2): 160 ug via INTRAVENOUS
  Administered 2022-05-09 (×2): 80 ug via INTRAVENOUS
  Administered 2022-05-09: 160 ug via INTRAVENOUS

## 2022-05-09 MED ORDER — APIXABAN 5 MG PO TABS
10.0000 mg | ORAL_TABLET | Freq: Two times a day (BID) | ORAL | Status: DC
  Administered 2022-05-09 – 2022-05-10 (×3): 10 mg
  Filled 2022-05-09 (×3): qty 2

## 2022-05-09 MED ORDER — APIXABAN 5 MG PO TABS: 5.0000 mg | ORAL_TABLET | Freq: Two times a day (BID) | ORAL | Status: DC

## 2022-05-09 MED ORDER — VITAL HIGH PROTEIN PO LIQD: 1000.0000 mL | ORAL | Status: DC

## 2022-05-09 MED ORDER — POTASSIUM CHLORIDE 10 MEQ/100ML IV SOLN
10.0000 meq | INTRAVENOUS | Status: AC
  Administered 2022-05-09 (×5): 10 meq via INTRAVENOUS
  Filled 2022-05-09 (×6): qty 100

## 2022-05-09 MED ORDER — EPHEDRINE SULFATE (PRESSORS) 50 MG/ML IJ SOLN
INTRAMUSCULAR | Status: DC | PRN
  Administered 2022-05-09 (×2): 10 mg via INTRAVENOUS

## 2022-05-09 MED ORDER — PROPOFOL 10 MG/ML IV BOLUS
INTRAVENOUS | Status: DC | PRN
  Administered 2022-05-09: 10 mg via INTRAVENOUS

## 2022-05-09 NOTE — Progress Notes (Signed)
Cross Cover Discussed CT AP findings with General Surgeon Dr Peyton Najjar Ferrel Logan and  Gastoenterology Dr Virgina Jock.  Intervention for volvulus release is  needed to prevent further compromise and possible death for patient.  Contacted [facility] resources. No family or contacts for patient found.  No additional contacts discovered with review of info in [place]. Patient is able to answer simple questions. He is oriented to person and time.  I agree colonoscopy procedure to release his volvulus is emergent. He is at risk for bowel ischemia, perforation and death without it.  Kathlene Cote NP [facility]

## 2022-05-09 NOTE — Transfer of Care (Signed)
Immediate Anesthesia Transfer of Care Note  Patient: [name]  Procedure(s) Performed: COLONOSCOPY WITH PROPOFOL  Patient Location: PACU and Endoscopy Unit  Anesthesia Type:General  Level of Consciousness: awake  Airway & Oxygen Therapy: Patient Spontanous Breathing  Post-op Assessment: Report given to RN  Post vital signs: stable  Last Vitals:  Vitals Value Taken Time  BP 98/47   Temp    Pulse 63   Resp 20   SpO2 97     Last Pain:  Vitals:   05/09/22 0159  TempSrc: Axillary  PainSc:          Complications: No notable events documented.

## 2022-05-09 NOTE — Progress Notes (Signed)
PROGRESS NOTE    [name]  XBL:[id number] DOB: [birth year] DOA: 05/02/2022 PCP: Rosetta Posner, MD   Brief Narrative:  This 81 y.o. male with medical history significant of HTN, HLD, GERD, COPD, CHF (diastolic), seizure disorder, BPH who comes in ED with presumed COPD exacerbation.  Per the EDP he was wheezing on admission and hypoxic.  EMS had to put him on CPAP to get his oxygen saturations up.  His blood gases were essentially normal.  He was weaned down to nasal cannula.  His chest x-ray was negative.  COVID was also negative. CT chest with contrast Filling defects within right lower lobe pulmonary arterial branches compatible with pulmonary emboli. Associated right basilar airspace opacity could reflect atelectasis or infarcts.Elevation of the right hemidiaphragm.  Patient is admitted for acute pulmonary embolism, started on IV heparin.  Patient has significant risk of aspiration.  Speech and swallow recommended n.p.o. with alternate means of feeding.  Palliative care consulted to discuss goals of care.  Patient has no capacity to make decision.  No alternate decision-maker has been identified at this time.  Patient has developed sigmoid volvulus overnight.  General surgery and GI consulted.  Patient underwent emergent colonoscopy with decompression of the volvulus.  Definitive treatment would be surgical correction.  Patient is at high risk for volvulus recurrence.  Decompression provides time but not a definite treatment.  Ethics committee consulted to make a determination since patient is no next of kin.  Assessment & Plan:   Principal Problem:   Acute pulmonary embolism (HCC) Active Problems:   UTI (urinary tract infection)   Chronic diastolic CHF (congestive heart failure) (HCC)   Seizure disorder (HCC)   Depression   Hypertension   Mixed hyperlipidemia   BPH (benign prostatic hyperplasia)   Normocytic anemia   Hypokalemia   DVT of lower extremity, bilateral (HCC)   Venous  insufficiency of both lower extremities   COPD exacerbation (HCC)   Malnutrition of moderate degree   Acute deep vein thrombosis (DVT) of left lower extremity (HCC)   Acute respiratory failure with hypoxia (HCC)  Acute pulmonary embolism / Acute Right LE DVT:  Patient found to have acute pulmonary embolism and acute Right LE DVT He was started on heparin gtt. Likely primary cause for his respiratory distress and oxygen requirements. Continue supplemental oxygen as needed. He was successfully transitioned to Eliquis, placed back on heparin IV due to n.p.o. and high aspiration risk.  UTI in the setting of chronic suprapubic catheter: Patient has indwelling suprapubic catheter and urine had a lot of sediment in it. Suprapubic catheter was changed  (05/03/22) It was changed on August 25th 2023 at urology office Remains afebrile without leukocytosis.   Started on antibiotic for possible aspiration pneumonia.  Aspiration pneumonia: Patient is started on IV antibiotics.   Chest x-ray shows improving infiltrate. Continue to monitor clinical status. NG tube inserted for medications and for feeding.  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  General surgery and GI consulted.   Patient underwent emergent colonoscopy with decompression of the volvulus.   Definitive treatment would be surgical correction.  Patient is at high risk for volvulus recurrence.   Decompression provides time but not a definite treatment.   Ethics committee consulted to make a determination since patient is no next of kin.  Chronic diastolic CHF: Continue ACE inhibitor. Continue carvedilol Continue Lasix as needed BNP is slightly elevated but chest x-ray does not show fluid overload.   Seizure disorder (HCC) Continue  phenobarbital.   Depression Continue Wellbutrin, Abilify.  Hypokalemia: Replacement in progress.  Continue to monitor   Hypertension Continue lisinopril,  carvedilol.   Mixed hyperlipidemia Continue Pravachol.   BPH (benign prostatic hyperplasia) Continue Proscar.   Normocytic anemia Hemoglobin is stable.   COPD: Currently no wheezing. Continue inhalers.   Venous insufficiency of both lower extremities Continue Lasix as needed   Dysphagia with high aspiration risk: Speech and swallow evaluation completed, high risk for aspiration recommended NPO. Patient was empirically started on ceftriaxone for aspiration pneumonia. Palliative care consulted to discuss goals of care, alternate means of feeding. Inserted NG tube for feeding and for medications  Obesity: Estimated body mass index is 31.84 kg/m as calculated from the following:   Height as of this encounter:  (1.727 m).   Weight as of this encounter: 95 kg.      DVT prophylaxis: Eliquis Code Status: Full code Family Communication: No family at bedside Disposition Plan:   Status is: Inpatient Remains inpatient appropriate because: Admitted for acute PE, acute right LE DVT started on Eliquis.  Patient has dysphagia with high aspiration risk,  Palliative care consulted to discuss goals of care, alternate means of feeding. Patient has no capacity to make decision.  No alternate decision-maker has been identified at this time. Patient has developed sigmoid volvulus overnight.  General surgery and GI consulted.  Patient underwent emergent colonoscopy with decompression of the volvulus.  Definitive treatment would be surgical correction.  Patient is at high risk for volvulus recurrence.  Decompression provides time but not a definite treatment.  Ethics committee consulted to make a determination since patient is no next of kin.   Consultants:  Palliative care  Procedures: None Antimicrobials:  Anti-infectives (From admission, onward)    Start     Dose/Rate Route Frequency Ordered Stop   05/05/22 0215  cefTRIAXone (ROCEPHIN) 1 g in sodium chloride 0.9 % 100 mL IVPB         1 g 200 mL/hr over 30 Minutes Intravenous Every 24 hours 05/05/22 0118 05/09/22 1610       Subjective: Patient was seen and examined at bedside.  Overnight events noted.   Patient is awake, following commands but has no capacity to make decisions about goals of care. Patient has developed sigmoid volvulus underwent emergent colonoscopy with decompression. Patient had a rectal tube, continue to remain n.p.o.  Objective: Vitals:   05/09/22 0300 05/09/22 0357 05/09/22 0812 05/09/22 1237  BP: (!) 101/49 117/61 (!) 152/54 (!) 150/79  Pulse: 78 85 83 90  Resp: Temp:  97.7 F (36.5 C) 97.8 F (36.6 C)   TempSrc:      SpO2: 100% 100% 96% 100%  Weight:      Height:        Intake/Output Summary (Last 24 hours) at 05/09/2022 1401 Last data filed at 05/09/2022 1236 Gross per 24 hour  Intake 1594.84 ml  Output 1150 ml  Net 444.84 ml   Filed Weights   05/02/22 2102 05/03/22 0842  Weight: 102.5 kg 95 kg    Examination:  General exam: Appears comfortable, NAD, deconditioned, NG tube noted. Respiratory system: Decreased breath sounds, respiratory effort normal, RR 15 Cardiovascular system: S1 & S2 heard, regular rate and rhythm, no murmur.   Gastrointestinal system: Abdomen is less distended, soft, non tender, BS+, rectal tube noted Central nervous system: Alert and oriented X 1 . No focal neurological deficits. Extremities: No edema, no cyanosis, no clubbing. Skin: No  rashes, lesions or ulcers Psychiatry: Mood & affect appropriate.     Data Reviewed: I have personally reviewed following labs and imaging studies  CBC: Recent Labs  Lab 05/02/22 2121 05/03/22 0406 05/05/22 1610 05/06/22 0453 05/07/22 0821 05/08/22 0434 05/09/22 0012  WBC 8.1   < > 10.4 8.4 8.7 8.5 8.8  NEUTROABS 6.3  --   --   --   --   --  6.4  HGB 13.7   < > 12.0* 12.0* 11.3* 11.4* 11.6*  HCT 43.1   < > 37.1* 37.1* 35.1* 35.7* 37.1*  MCV 86.7   < > 84.7 85.9 86.2 86.9 88.1  PLT 273   <  > 292 273 225 271 272   < > = values in this interval not displayed.   Basic Metabolic Panel: Recent Labs  Lab 05/04/22 0448 05/04/22 1329 05/05/22 9604 05/05/22 1343 05/06/22 0453 05/06/22 1737 05/07/22 0821 05/07/22 1747 05/08/22 0434 05/08/22 1740 05/09/22 0012  NA 142  --  143  --  143  --  142  --  145  --  147*  K 2.3*   < > 2.4*   < > 2.4*   < > 2.9* 3.1* 2.8* 3.4* 3.2*  3.2*  CL 111  --  114*  --  113*  --  117*  --  118*  --  121*  CO2 24  --  23  --  22  --  21*  --  17*  --  18*  GLUCOSE 141*  --  173*  --  127*  --  108*  --  110*  --  107*  BUN 31*  --  40*  --  49*  --  54*  --  52*  --  50*  CREATININE 1.57*  --  2.03*  --  2.10*  --  2.07*  --  1.79*  --  1.85*  CALCIUM 8.6*  --  8.7*  --  8.5*  --  8.4*  --  8.7*  --  8.9  MG 2.1  --  2.5*  --  2.3  --  2.4  --  2.4  --   --   PHOS  --   --   --   --  3.3  --   --   --   --   --   --    < > = values in this interval not displayed.   GFR: Estimated Creatinine Clearance: 36.2 mL/min (A) (by C-G formula based on SCr of 1.85 mg/dL (H)). Liver Function Tests: Recent Labs  Lab 05/02/22 2121 05/09/22 0012  AST 16 10*  ALT 10 9  ALKPHOS 100 64  BILITOT 0.9 0.9  PROT 8.1 6.7  ALBUMIN 3.3* 2.7*   No results for input(s): "LIPASE", "AMYLASE" in the last 168 hours. No results for input(s): "AMMONIA" in the last 168 hours. Coagulation Profile: Recent Labs  Lab 05/02/22 2121 05/09/22 0012  INR 1.2 1.3*   Cardiac Enzymes: No results for input(s): "CKTOTAL", "CKMB", "CKMBINDEX", "TROPONINI" in the last 168 hours. BNP (last 3 results) No results for input(s): "PROBNP" in the last 8760 hours. HbA1C: No results for input(s): "HGBA1C" in the last 72 hours.  CBG: Recent Labs  Lab 05/08/22 1218 05/08/22 1553 05/08/22 2010 05/09/22 0805 05/09/22 1129  GLUCAP 107* 107* 93 101* 117*   Lipid Profile: No results for input(s): "CHOL", "HDL", "LDLCALC", "TRIG", "CHOLHDL", "LDLDIRECT" in the last 72  hours. Thyroid Function Tests: No  results for input(s): "TSH", "T4TOTAL", "FREET4", "T3FREE", "THYROIDAB" in the last 72 hours.  Anemia Panel: No results for input(s): "VITAMINB12", "FOLATE", "FERRITIN", "TIBC", "IRON", "RETICCTPCT" in the last 72 hours. Sepsis Labs: Recent Labs  Lab 05/02/22 2121 05/02/22 2319 05/06/22 0448 05/09/22 0012  PROCALCITON  --   --  <0.10 <0.10  LATICACIDVEN 1.5 0.4*  --  0.9    Recent Results (from the past 240 hour(s))  SARS Coronavirus 2 by RT PCR (hospital order, performed in [facility] hospital lab) *cepheid single result test* Anterior Nasal Swab     Status: None   Collection Time: 05/02/22  9:21 PM   Specimen: Anterior Nasal Swab  Result Value Ref Range Status   SARS Coronavirus 2 by RT PCR NEGATIVE NEGATIVE Final    Comment: (NOTE) SARS-CoV-2 target nucleic acids are NOT DETECTED.  The SARS-CoV-2 RNA is generally detectable in upper and lower respiratory specimens during the acute phase of infection. The lowest concentration of SARS-CoV-2 viral copies this assay can detect is 250 copies / mL. A negative result does not preclude SARS-CoV-2 infection and should not be used as the sole basis for treatment or other patient management decisions.  A negative result may occur with improper specimen collection / handling, submission of specimen other than nasopharyngeal swab, presence of viral mutation(s) within the areas targeted by this assay, and inadequate number of viral copies (<250 copies / mL). A negative result must be combined with clinical observations, patient history, and epidemiological information.  Fact Sheet for Patients:   RoadLapTop.co.za  Fact Sheet for Healthcare Providers: http://kim-miller.com/  This test is not yet approved or  cleared by the [place] FDA and has been authorized for detection and/or diagnosis of SARS-CoV-2 by FDA under an Emergency Use Authorization  (EUA).  This EUA will remain in effect (meaning this test can be used) for the duration of the COVID-19 declaration under Section 564(b)(1) of the Act, 21 U.S.C. section 360bbb-3(b)(1), unless the authorization is terminated or revoked sooner.  Performed at [facility], [address] [place], [place] [address]   Respiratory (~20 pathogens) panel by PCR     Status: None   Collection Time: 05/03/22  1:18 AM   Specimen: Nasopharyngeal Swab; Respiratory  Result Value Ref Range Status   Adenovirus NOT DETECTED NOT DETECTED Final   Coronavirus 229E NOT DETECTED NOT DETECTED Final    Comment: (NOTE) The Coronavirus on the Respiratory Panel, DOES NOT test for the novel  Coronavirus (2019 nCoV)    Coronavirus HKU1 NOT DETECTED NOT DETECTED Final   Coronavirus NL63 NOT DETECTED NOT DETECTED Final   Coronavirus OC43 NOT DETECTED NOT DETECTED Final   Metapneumovirus NOT DETECTED NOT DETECTED Final   Rhinovirus / Enterovirus NOT DETECTED NOT DETECTED Final   Influenza A NOT DETECTED NOT DETECTED Final   Influenza B NOT DETECTED NOT DETECTED Final   Parainfluenza Virus 1 NOT DETECTED NOT DETECTED Final   Parainfluenza Virus 2 NOT DETECTED NOT DETECTED Final   Parainfluenza Virus 3 NOT DETECTED NOT DETECTED Final   Parainfluenza Virus 4 NOT DETECTED NOT DETECTED Final   Respiratory Syncytial Virus NOT DETECTED NOT DETECTED Final   Bordetella pertussis NOT DETECTED NOT DETECTED Final   Bordetella Parapertussis NOT DETECTED NOT DETECTED Final   Chlamydophila pneumoniae NOT DETECTED NOT DETECTED Final   Mycoplasma pneumoniae NOT DETECTED NOT DETECTED Final    Comment: Performed at [facility], [address], [place], [place] [address]  MRSA Next Gen  by PCR, Nasal     Status: None   Collection Time: 05/03/22 11:24 AM   Specimen: Nasal Mucosa; Nasal Swab  Result Value Ref Range Status   MRSA by PCR Next Gen NOT DETECTED NOT DETECTED Final    Comment: (NOTE) The GeneXpert  MRSA Assay (FDA approved for NASAL specimens only), is one component of a comprehensive MRSA colonization surveillance program. It is not intended to diagnose MRSA infection nor to guide or monitor treatment for MRSA infections. Test performance is not FDA approved in patients less than 11 years old. Performed at [facility], [address] [place], [place] [address]     Radiology Studies: DG Abd 1 View  Result Date: 05/09/2022 CLINICAL DATA:  Follow-up sigmoid volvulus. EXAM: ABDOMEN - 1 VIEW COMPARISON:  CT, 05/08/2022.  Radiographs, 05/09/2022 at 3:12 a.m. FINDINGS: Gas-filled loops of small bowel and colon. There is colonic dilation that is decreased from the prior radiographs and more significantly decreased when compared to the CT from 05/08/2022. Rectal/colonic tube tip projects in the right mid to upper abdomen. Stable suprapubic catheter. IMPRESSION: 1. Interval improvement with a decrease in colonic dilation compared to the exam earlier today, and more significantly improved compared to the previous day's CT. Electronically Signed   By: Amie Portland M.D.   On: 05/09/2022 09:34   DG Abd 1 View  Result Date: 05/09/2022 CLINICAL DATA:  Status post decompression of sigmoid volvulus EXAM: ABDOMEN - 1 VIEW COMPARISON:  CT abdomen and pelvis 05/08/2022 FINDINGS: Enteric tube tip in the stomach with side port not well visualized but likely near the gastroesophageal junction. Redemonstrated marked gaseous distention of the colon, similar to slightly decreased from previous CT. Foley catheter in the bladder. IMPRESSION: Similar to slight decrease in gaseous distention of the colon compared to prior CT. Electronically Signed   By: Minerva Fester M.D.   On: 05/09/2022 03:52   CT ABDOMEN PELVIS WO CONTRAST  Result Date: 05/08/2022 CLINICAL DATA:  Suspected bowel obstruction. EXAM: CT ABDOMEN AND PELVIS WITHOUT CONTRAST TECHNIQUE: Multidetector CT imaging of the abdomen and pelvis was  performed following the standard protocol without IV contrast. RADIATION DOSE REDUCTION: This exam was performed according to the departmental dose-optimization program which includes automated exposure control, adjustment of the mA and/or kV according to patient size and/or use of iterative reconstruction technique. COMPARISON:  March 25, 2022 FINDINGS: Lower chest: Marked severity consolidation is seen within the posterior aspect of the right lung base. Mild posterolateral left basilar atelectasis is noted. Hepatobiliary: No focal liver abnormality is seen. The gallbladder is contracted without evidence of gallstones, gallbladder wall thickening, or biliary dilatation. Pancreas: Unremarkable. No pancreatic ductal dilatation or surrounding inflammatory changes. Spleen: Normal in size without focal abnormality. Adrenals/Urinary Tract: Adrenal glands are unremarkable. Kidneys are normal in size with mild bilateral renal cortical thinning noted. Multiple cysts are seen within the right kidney. The largest measures approximately 6.9 cm x 4.9 cm. Numerous bilateral renal and proximal ureteral calculi of various sizes are seen. The largest on the right measures approximately 1.3 cm, while the largest on the left measures 1.0 cm. A suprapubic catheter is seen within an empty urinary bladder. Stomach/Bowel: A nasogastric tube is seen with its distal tip noted within the body of the stomach. Numerous markedly dilated large bowel loops are seen throughout the abdomen and pelvis. A transition zone is seen within the mid sigmoid colon. Twisting of the mesentery is noted within this region (axial CT images 78 through 87,  CT series 2). The small bowel loops are predominantly decompressed. The appendix is normal in appearance. Vascular/Lymphatic: Aortic atherosclerosis. No enlarged abdominal or pelvic lymph nodes. Reproductive: The prostate gland is not identified. Other: There is moderate severity anasarca involving  predominantly the posterolateral aspects of the abdominal and pelvic walls. No abdominopelvic ascites. Musculoskeletal: Marked severity multilevel degenerative changes seen throughout the lumbar spine. IMPRESSION: 1. 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 2. Marked severity right lower lobe consolidation, consistent with pneumonia. 3. Numerous bilateral renal and proximal ureteral calculi of various sizes. 4. Multiple right renal cysts (Bosniak 2). No additional follow-up or imaging is recommended. This recommendation follows ACR consensus guidelines: Management of the Incidental Renal Mass on CT: A White Paper of the ACR Incidental Findings Committee. J Am Coll Radiol [phone];15:264-273. 5. Moderate severity anasarca. 6. Aortic atherosclerosis. Electronically Signed   By: Aram Candelahaddeus  Houston M.D.   On: 05/08/2022 23:25   DG Abd 1 View  Result Date: 05/08/2022 CLINICAL DATA:  [id number] NG tube placement EXAM: ABDOMEN - 1 VIEW COMPARISON:  May 08, 2022 FINDINGS: Incomplete visualization of the abdomen laterally and the pelvis. Carina is not visualized, limiting evaluation. Enteric tube tip projects over the expected region of the proximal stomach. Side port projects over the region of the distal esophagus. Marked gaseous dilation of loops of bowel, similar in comparison to prior. IMPRESSION: 1. Enteric tube side port projects over the distal esophagus. 2. Similar appearance of diffuse gaseous dilation of loops of bowel, possibly ileus. If concern for obstruction, recommend CT. Electronically Signed   By: Meda KlinefelterStephanie  Peacock M.D.   On: 05/08/2022 12:08   DG Abd 1 View  Result Date: 05/08/2022 CLINICAL DATA:  Check gastric catheter placement EXAM: ABDOMEN - 1 VIEW COMPARISON:  Film from the previous day. FINDINGS: Gastric catheter is again noted in the stomach. Scattered large and small bowel gas is again seen and stable. IMPRESSION:  Gastric catheter in the stomach. Electronically Signed   By: Alcide CleverMark  Lukens M.D.   On: 05/08/2022 00:32   DG Abd 1 View  Result Date: 05/07/2022 CLINICAL DATA:  Check gastric catheter placement EXAM: ABDOMEN - 1 VIEW COMPARISON:  None Available. FINDINGS: Gastric catheter is noted within the stomach. Scattered large and small bowel gas is again identified. This likely represents an ileus. Correlate with clinical exam. Degenerative changes of lumbar spine are noted. IMPRESSION: Gastric catheter in place. Considerable gaseous distension of the bowel which may represent an ileus. Electronically Signed   By: Alcide CleverMark  Lukens M.D.   On: 05/07/2022 19:32    Scheduled Meds:  apixaban  10 mg Per Tube BID   Followed by   Melene Muller[START ON 05/16/2022] apixaban  5 mg Per Tube BID   ARIPiprazole  20 mg Per Tube Daily   ascorbic acid  500 mg Per Tube BID   buPROPion  150 mg Per Tube BID   carvedilol  25 mg Per Tube BID   Chlorhexidine Gluconate Cloth  6 each Topical Daily   cholecalciferol  1,000 Units Per Tube q AM   dextromethorphan  5 mL Per Tube Q12H   ferrous sulfate  325 mg Per Tube Q breakfast   finasteride  5 mg Oral Daily   fluticasone furoate-vilanterol  1 puff Inhalation Daily   folic acid  1 mg Per Tube q AM   insulin aspart  0-15 Units Subcutaneous TID WC   ipratropium-albuterol  3 mL Nebulization Q6H  loratadine  10 mg Per Tube Daily   multivitamin with minerals  1 tablet Per Tube Daily   mouth rinse  15 mL Mouth Rinse 4 times per day   PHENObarbital  65 mg Intravenous QODAY   pravastatin  10 mg Per Tube QHS   sodium chloride flush  3 mL Intravenous Q12H   umeclidinium bromide  1 puff Inhalation Daily   Continuous Infusions:  sodium chloride Stopped (05/07/22 1030)   dextrose 5 % and 0.45 % NaCl with KCl 20 mEq/L 125 mL/hr at 05/09/22 0122   potassium chloride 10 mEq (05/09/22 1344)     LOS: 7 days    Time spent: 35 mins    Kemya Shed, MD [facility]   If 7PM-7AM, please  contact night-coverage

## 2022-05-09 NOTE — Plan of Care (Signed)

## 2022-05-09 NOTE — OR Nursing (Signed)
PT TRANSFERRED BACK TO ROOM WITH RECTAL CATHETER SECURED WITH SACRAL DRESSING AND LARGE TEGADERM. REPORT TO PRIMARY NURSE JANET . STRESSED IMPORTANCE OF KEEPING RECTAL TUBE IN PLACE. ABDOMEN SOFT. TOLERATED PROCEDURE WELL

## 2022-05-09 NOTE — Interval H&P Note (Signed)
History and Physical Interval Note: Preprocedure H&P from 05/09/22  was reviewed and there was no interval change after seeing and examining the patient. Pt non peritoneal, no signs of free air on CT.  Emergent consent was obtained from two providers (myself and hospitalist advanced practitioner) as the patient is not able to provide his own consent. I did discuss with the patient the risks, benefits, and alternatives. Patient has consented to proceed with Colonoscopy with possible intervention as well in addition to emergent consent. Patient insight to condition remains limited.   05/09/2022 2:01 AM  [name]  has presented today for surgery, with the diagnosis of sigmoid volvulus.  The various methods of treatment have been discussed with the patient and family. After consideration of risks, benefits and other options for treatment, the patient has consented to  Procedure(s): COLONOSCOPY WITH PROPOFOL (N/A) as a surgical intervention.  The patient's history has been reviewed, patient examined, no change in status, stable for surgery.  I have reviewed the patient's chart and labs.  Questions were answered to the patient's satisfaction.     Annamaria Helling

## 2022-05-09 NOTE — Consult Note (Signed)
  Inpatient Consultation   Patient ID: [name] is a 81 y.o. male.  Requesting Provider: Brenda Morrison, NP  Date of Admission: 05/02/2022  Date of Consult: 05/09/22   Reason for Consultation: Sigmoid Volvulus   Patient's Chief Complaint:   Chief Complaint  Patient presents with   Shortness of Breath    81-year-old Caucasian male c HFpEF admitted to the hospital on September 24 for shortness of breath where he was found to have a pulmonary embolism and started on heparin drip and abx for UTI. Pt is unable to provide history and most is gained from chart review and in conversation with nursing and surgeon.  Over the last few days, the patient has had worsening abdominal distention. He has not had a bowel movement in several days per nursing. NGT in place to IWS. KUB demonstrates significant gaseous distention. CT was performed on 9/30 at 2325 demonstrating sigmoid volvulus without free air. GI service was contacted at 1158.  Pt's mentation fluctuates per chart review and he is unable to make his own decisions with poor insight per palliative care team. No alternative decision maker has been identified.  Abdomen is grossly distended and taut. He denies abdominal pain. He repeatedly asks about having something to drink.  Heparin gtt stopped at 1000 on 05/07/22. Eliquis was on 9/26 at 1030 am Denies family history of gastrointestinal disease and malignancy per chart review Previous Endoscopies: unknown; pt denies previous colonoscopy    Past Medical History:  Diagnosis Date   Bipolar 1 disorder (HCC)    C. difficile diarrhea 05/2016   history of ...   CHF (congestive heart failure) (HCC)    Chronic kidney disease 05/21/2016   acute renal failure with sepsis and uti   COPD (chronic obstructive pulmonary disease) (HCC)    DVT (deep venous thrombosis) (HCC)    GERD (gastroesophageal reflux disease)    Hematuria    High cholesterol    History of kidney stones     Hypertension    Penile erosion 2018   d/t frequent foley catheters   Schizophrenia (HCC)    Seizure (HCC)    on phenobarb   Urinary retention    frequently requiring foley placement   VRE (vancomycin resistant enterococcus) culture positive    urine    Past Surgical History:  Procedure Laterality Date   EXTRACORPOREAL SHOCK WAVE LITHOTRIPSY Right 05/05/2017   Procedure: EXTRACORPOREAL SHOCK WAVE LITHOTRIPSY (ESWL);  Surgeon: Brandon, Ashley, MD;  Location: [facility] ORS;  Service: Urology;  Laterality: Right;   IR CATHETER TUBE CHANGE  06/23/2017   IR CATHETER TUBE CHANGE  06/28/2017    Allergies  Allergen Reactions   Penicillin G Rash and Other (See Comments)    Convulsions.    Has patient had a PCN reaction causing immediate rash, facial/tongue/throat swelling, SOB or lightheadedness with hypotension: not sure Has patient had a PCN reaction causing severe rash involving mucus membranes or skin necrosis: not sure Has patient had a PCN reaction that required hospitalization: not sure Has patient had a PCN reaction occurring within the last 10 years: not sure If all of the above answers are "NO", then may proceed with Cephalosporin use.    Family History  Problem Relation Age of Onset   Cancer Mother    Prostate cancer Neg Hx    Kidney disease Neg Hx    Kidney cancer Neg Hx    Bladder Cancer Neg Hx     Social History   Tobacco Use     Smoking status: Never    Passive exposure: Never   Smokeless tobacco: Never  Vaping Use   Vaping Use: Never used  Substance Use Topics   Alcohol use: No   Drug use: No     Pertinent GI related history and allergies were reviewed with the patient  Review of Systems  Unable to perform ROS: Mental status change  Respiratory:  Negative for shortness of breath.   Cardiovascular:  Negative for chest pain.  Gastrointestinal:  Positive for abdominal distention and constipation. Negative for abdominal pain, nausea and vomiting.     Medications Home Medications No current facility-administered medications on file prior to encounter.   Current Outpatient Medications on File Prior to Encounter  Medication Sig Dispense Refill   ammonium lactate (AMLACTIN) 12 % cream Apply 1 Application topically every 12 (twelve) hours.     ARIPiprazole (ABILIFY) 20 MG tablet Take 20 mg by mouth in the morning.     ascorbic acid (VITAMIN C) 500 MG tablet Take 1 tablet (500 mg total) by mouth 2 (two) times daily.     buPROPion (BUDEPRION XL) 300 MG 24 hr tablet Take 1 tablet (300 mg total) by mouth daily. 30 tablet 0   carvedilol (COREG) 25 MG tablet Take 25 mg by mouth 2 (two) times daily.     cetirizine (ZYRTEC) 10 MG tablet Take 10 mg by mouth in the morning.     cholecalciferol (VITAMIN D3) 25 MCG (1000 UNIT) tablet Take 1,000 Units by mouth in the morning.     Cranberry 250 MG CAPS Take 250 mg by mouth 2 (two) times daily.     Dextromethorphan HBr 15 MG CAPS Take 2 capsules by mouth every 12 (twelve) hours.     ferrous sulfate 325 (65 FE) MG tablet Take 325 mg by mouth daily with breakfast.     finasteride (PROSCAR) 5 MG tablet Take 1 tablet (5 mg total) by mouth daily. 30 tablet 0   folic acid (FOLVITE) 1 MG tablet Take 1 mg by mouth in the morning.     furosemide (LASIX) 20 MG tablet Take 20 mg by mouth 2 (two) times daily.     lisinopril (ZESTRIL) 10 MG tablet Take 10 mg by mouth daily.     Multiple Vitamin (MULTI-VITAMINS) TABS Take 1 tablet by mouth daily.     PHENobarbital (LUMINAL) 64.8 MG tablet Take 64.8 mg by mouth every other day.     potassium chloride SA (KLOR-CON M) 20 MEQ tablet Take 20 mEq by mouth daily.     pravastatin (PRAVACHOL) 10 MG tablet Take 10 mg by mouth at bedtime.     Suvorexant 5 MG TABS Take 5 mg by mouth at bedtime.     zinc oxide 20 % ointment Apply 1 application. topically 2 (two) times daily. (Apply to legs)     albuterol (VENTOLIN HFA) 108 (90 Base) MCG/ACT inhaler Inhale 2 puffs into the lungs  every 6 (six) hours as needed for wheezing or shortness of breath.     tamsulosin (FLOMAX) 0.4 MG CAPS capsule Take 0.4 mg by mouth daily. (Patient not taking: Reported on 05/02/2022)     Pertinent GI related medications were reviewed with the patient  Inpatient Medications  Current Facility-Administered Medications:    0.9 %  sodium chloride infusion, 250 mL, Intravenous, PRN, Pratt, Tanya S, MD, Stopped at 05/07/22 1030   0.9 %  sodium chloride infusion, , Intravenous, Continuous, Kumar, Pardeep, MD, Last Rate: 50 mL/hr at 05/08/22 1817, New   Bag at 05/08/22 1817   acetaminophen (TYLENOL) tablet 650 mg, 650 mg, Per Tube, Q6H PRN **OR** acetaminophen (TYLENOL) suppository 650 mg, 650 mg, Rectal, Q6H PRN, Kumar, Pardeep, MD   albuterol (PROVENTIL) (2.5 MG/3ML) 0.083% nebulizer solution 2.5 mg, 2.5 mg, Nebulization, Q2H PRN, Pratt, Tanya S, MD   ARIPiprazole (ABILIFY) tablet 20 mg, 20 mg, Per Tube, Daily, Kumar, Pardeep, MD, 20 mg at 05/08/22 1056   ascorbic acid (VITAMIN C) tablet 500 mg, 500 mg, Per Tube, BID, Kumar, Pardeep, MD, 500 mg at 05/08/22 2038   buPROPion (WELLBUTRIN) tablet 150 mg, 150 mg, Per Tube, BID, Kumar, Pardeep, MD, 150 mg at 05/08/22 2039   carvedilol (COREG) tablet 25 mg, 25 mg, Per Tube, BID, Kumar, Pardeep, MD, 25 mg at 05/08/22 2039   cefTRIAXone (ROCEPHIN) 1 g in sodium chloride 0.9 % 100 mL IVPB, 1 g, Intravenous, Q24H, Chavez, Abigail, NP, Last Rate: 200 mL/hr at 05/08/22 0115, 1 g at 05/08/22 0115   Chlorhexidine Gluconate Cloth 2 % PADS 6 each, 6 each, Topical, Daily, Patel, Sona, MD, 6 each at 05/08/22 1004   cholecalciferol (VITAMIN D3) 25 MCG (1000 UNIT) tablet 1,000 Units, 1,000 Units, Per Tube, q AM, Kumar, Pardeep, MD, 1,000 Units at 05/08/22 1055   dextromethorphan (DELSYM) 30 MG/5ML liquid 30 mg, 5 mL, Per Tube, Q12H, Kumar, Pardeep, MD, 30 mg at 05/08/22 1056   ferrous sulfate 220 (44 Fe) MG/5ML solution 325 mg, 325 mg, Per Tube, Q breakfast, Kumar, Pardeep,  MD, 325 mg at 05/08/22 1055   finasteride (PROSCAR) tablet 5 mg, 5 mg, Oral, Daily, Kumar, Pardeep, MD, 5 mg at 05/08/22 1054   fluticasone furoate-vilanterol (BREO ELLIPTA) 100-25 MCG/ACT 1 puff, 1 puff, Inhalation, Daily, Pratt, Tanya S, MD, 1 puff at 05/07/22 1017   folic acid (FOLVITE) tablet 1 mg, 1 mg, Per Tube, q AM, Kumar, Pardeep, MD, 1 mg at 05/08/22 1054   guaiFENesin (ROBITUSSIN) 100 MG/5ML liquid 5 mL, 5 mL, Per Tube, Q4H PRN, Kumar, Pardeep, MD   HYDROcodone-acetaminophen (NORCO/VICODIN) 5-325 MG per tablet 1-2 tablet, 1-2 tablet, Per Tube, Q4H PRN, Kumar, Pardeep, MD, 2 tablet at 05/08/22 0623   insulin aspart (novoLOG) injection 0-15 Units, 0-15 Units, Subcutaneous, TID WC, Pratt, Tanya S, MD, 2 Units at 05/06/22 1616   ipratropium-albuterol (DUONEB) 0.5-2.5 (3) MG/3ML nebulizer solution 3 mL, 3 mL, Nebulization, Q6H, Pratt, Tanya S, MD, 3 mL at 05/08/22 2018   lactulose (CHRONULAC) 10 GM/15ML solution 10 g, 10 g, Per Tube, Q6H PRN, Kumar, Pardeep, MD, 10 g at 05/08/22 1311   loratadine (CLARITIN) tablet 10 mg, 10 mg, Per Tube, Daily, Kumar, Pardeep, MD, 10 mg at 05/08/22 1056   morphine (PF) 2 MG/ML injection 1 mg, 1 mg, Intravenous, Q6H PRN, Kumar, Pardeep, MD, 1 mg at 05/08/22 2039   multivitamin with minerals tablet 1 tablet, 1 tablet, Per Tube, Daily, Kumar, Pardeep, MD, 1 tablet at 05/08/22 1054   Oral care mouth rinse, 15 mL, Mouth Rinse, PRN, Patel, Sona, MD   Oral care mouth rinse, 15 mL, Mouth Rinse, 4 times per day, Patel, Sona, MD, 15 mL at 05/08/22 1640   Oral care mouth rinse, 15 mL, Mouth Rinse, PRN, Patel, Sona, MD   PHENObarbital (LUMINAL) injection 65 mg, 65 mg, Intravenous, QODAY, Morrison, Brenda, NP, 65 mg at 05/08/22 1006   polyethylene glycol (MIRALAX / GLYCOLAX) packet 17 g, 17 g, Per Tube, Daily PRN, Kumar, Pardeep, MD   pravastatin (PRAVACHOL) tablet 10 mg, 10 mg, Per Tube, QHS,   Shawna Clamp, MD, 10 mg at 05/08/22 2038   sodium chloride flush (NS) 0.9 %  injection 3 mL, 3 mL, Intravenous, Q12H, Donnamae Jude, MD, 3 mL at 05/08/22 2039   sodium chloride flush (NS) 0.9 % injection 3 mL, 3 mL, Intravenous, PRN, Donnamae Jude, MD   umeclidinium bromide (INCRUSE ELLIPTA) 62.5 MCG/ACT 1 puff, 1 puff, Inhalation, Daily, Donnamae Jude, MD, 1 puff at 05/07/22 1018  sodium chloride Stopped (05/07/22 1030)   sodium chloride 50 mL/hr at 05/08/22 1817   cefTRIAXone (ROCEPHIN)  IV 1 g (05/08/22 0115)    sodium chloride, acetaminophen **OR** acetaminophen, albuterol, guaiFENesin, HYDROcodone-acetaminophen, lactulose, morphine injection, mouth rinse, mouth rinse, polyethylene glycol, sodium chloride flush   Objective   Vitals:   05/08/22 1657 05/08/22 1914 05/08/22 2018 05/08/22 2318  BP: 130/88 (!) 143/96  137/86  Pulse: 84 89  88  Resp: (!) 21 16  17   Temp: 98.1 F (36.7 C) 98.3 F (36.8 C)  97.6 F (36.4 C)  TempSrc:      SpO2: 98% 96% 96% 90%  Weight:      Height:         Physical Exam Vitals and nursing note reviewed.  Constitutional:      General: He is not in acute distress.    Appearance: He is obese. He is ill-appearing. He is not toxic-appearing or diaphoretic.  HENT:     Head: Normocephalic and atraumatic.     Nose: Nose normal.     Comments: NGT in place to LIWS    Mouth/Throat:     Mouth: Mucous membranes are dry.  Eyes:     General: No scleral icterus.    Extraocular Movements: Extraocular movements intact.  Cardiovascular:     Rate and Rhythm: Normal rate and regular rhythm.     Heart sounds: Normal heart sounds. No murmur heard.    No friction rub. No gallop.  Pulmonary:     Effort: No respiratory distress.     Breath sounds: No wheezing, rhonchi or rales.     Comments: Coarse breath sounds bilaterally Abdominal:     General: Abdomen is flat. Bowel sounds are normal. There is distension (taut).     Palpations: Abdomen is soft.     Tenderness: There is no abdominal tenderness. There is no guarding or rebound.      Comments: High pitched bowel sounds. Non-peritoneal at this time.  Musculoskeletal:     Cervical back: Neck supple.  Skin:    General: Skin is warm and dry.     Coloration: Skin is not jaundiced or pale.  Neurological:     Mental Status: He is alert.     Comments: Oriented to name, birth date, place. Seems to have poor insight into severity of illness     Laboratory Data Recent Labs  Lab 05/06/22 0453 05/07/22 0821 05/08/22 0434  WBC 8.4 8.7 8.5  HGB 12.0* 11.3* 11.4*  HCT 37.1* 35.1* 35.7*  PLT 273 225 271   Recent Labs  Lab 05/02/22 2121 05/03/22 0406 05/06/22 0453 05/06/22 1737 05/07/22 0821 05/07/22 1747 05/08/22 0434 05/08/22 1740  NA 144   < > 143  --  142  --  145  --   K 2.9*   < > 2.4*   < > 2.9* 3.1* 2.8* 3.4*  CL 107   < > 113*  --  117*  --  118*  --   CO2 27   < > 22  --  21*  --  17*  --   BUN 14   < > 49*  --  54*  --  52*  --   CALCIUM 8.7*   < > 8.5*  --  8.4*  --  8.7*  --   PROT 8.1  --   --   --   --   --   --   --   BILITOT 0.9  --   --   --   --   --   --   --   ALKPHOS 100  --   --   --   --   --   --   --   ALT 10  --   --   --   --   --   --   --   AST 16  --   --   --   --   --   --   --   GLUCOSE 143*   < > 127*  --  108*  --  110*  --    < > = values in this interval not displayed.   Recent Labs  Lab 05/02/22 2121  INR 1.2    No results for input(s): "LIPASE" in the last 72 hours.      Imaging Studies: CT ABDOMEN PELVIS WO CONTRAST  Result Date: 05/08/2022 CLINICAL DATA:  Suspected bowel obstruction. EXAM: CT ABDOMEN AND PELVIS WITHOUT CONTRAST TECHNIQUE: Multidetector CT imaging of the abdomen and pelvis was performed following the standard protocol without IV contrast. RADIATION DOSE REDUCTION: This exam was performed according to the departmental dose-optimization program which includes automated exposure control, adjustment of the mA and/or kV according to patient size and/or use of iterative reconstruction technique.  COMPARISON:  March 25, 2022 FINDINGS: Lower chest: Marked severity consolidation is seen within the posterior aspect of the right lung base. Mild posterolateral left basilar atelectasis is noted. Hepatobiliary: No focal liver abnormality is seen. The gallbladder is contracted without evidence of gallstones, gallbladder wall thickening, or biliary dilatation. Pancreas: Unremarkable. No pancreatic ductal dilatation or surrounding inflammatory changes. Spleen: Normal in size without focal abnormality. Adrenals/Urinary Tract: Adrenal glands are unremarkable. Kidneys are normal in size with mild bilateral renal cortical thinning noted. Multiple cysts are seen within the right kidney. The largest measures approximately 6.9 cm x 4.9 cm. Numerous bilateral renal and proximal ureteral calculi of various sizes are seen. The largest on the right measures approximately 1.3 cm, while the largest on the left measures 1.0 cm. A suprapubic catheter is seen within an empty urinary bladder. Stomach/Bowel: A nasogastric tube is seen with its distal tip noted within the body of the stomach. Numerous markedly dilated large bowel loops are seen throughout the abdomen and pelvis. A transition zone is seen within the mid sigmoid colon. Twisting of the mesentery is noted within this region (axial CT images 78 through 87, CT series 2). The small bowel loops are predominantly decompressed. The appendix is normal in appearance. Vascular/Lymphatic: Aortic atherosclerosis. No enlarged abdominal or pelvic lymph nodes. Reproductive: The prostate gland is not identified. Other: There is moderate severity anasarca involving predominantly the posterolateral aspects of the abdominal and pelvic walls. No abdominopelvic ascites. Musculoskeletal: Marked severity multilevel degenerative changes seen throughout the lumbar spine. IMPRESSION: 1. Sigmoid volvulus with subsequent high-grade colonic obstruction and a transition zone seen within the mid sigmoid  colon. 2. Marked severity right lower lobe consolidation, consistent with pneumonia. 3. Numerous bilateral renal and proximal ureteral   calculi of various sizes. 4. Multiple right renal cysts (Bosniak 2). No additional follow-up or imaging is recommended. This recommendation follows ACR consensus guidelines: Management of the Incidental Renal Mass on CT: A White Paper of the ACR Incidental Findings Committee. J Am Coll Radiol 2018;15:264-273. 5. Moderate severity anasarca. 6. Aortic atherosclerosis. Electronically Signed   By: Thaddeus  Houston M.D.   On: 05/08/2022 23:25   DG Abd 1 View  Result Date: 05/08/2022 CLINICAL DATA:  [id number] NG tube placement EXAM: ABDOMEN - 1 VIEW COMPARISON:  May 08, 2022 FINDINGS: Incomplete visualization of the abdomen laterally and the pelvis. Carina is not visualized, limiting evaluation. Enteric tube tip projects over the expected region of the proximal stomach. Side port projects over the region of the distal esophagus. Marked gaseous dilation of loops of bowel, similar in comparison to prior. IMPRESSION: 1. Enteric tube side port projects over the distal esophagus. 2. Similar appearance of diffuse gaseous dilation of loops of bowel, possibly ileus. If concern for obstruction, recommend CT. Electronically Signed   By: Stephanie  Peacock M.D.   On: 05/08/2022 12:08   DG Abd 1 View  Result Date: 05/08/2022 CLINICAL DATA:  Check gastric catheter placement EXAM: ABDOMEN - 1 VIEW COMPARISON:  Film from the previous day. FINDINGS: Gastric catheter is again noted in the stomach. Scattered large and small bowel gas is again seen and stable. IMPRESSION: Gastric catheter in the stomach. Electronically Signed   By: Mark  Lukens M.D.   On: 05/08/2022 00:32   DG Abd 1 View  Result Date: 05/07/2022 CLINICAL DATA:  Check gastric catheter placement EXAM: ABDOMEN - 1 VIEW COMPARISON:  None Available. FINDINGS: Gastric catheter is noted within the stomach. Scattered large and small  bowel gas is again identified. This likely represents an ileus. Correlate with clinical exam. Degenerative changes of lumbar spine are noted. IMPRESSION: Gastric catheter in place. Considerable gaseous distension of the bowel which may represent an ileus. Electronically Signed   By: Mark  Lukens M.D.   On: 05/07/2022 19:32    Assessment:   # Sigmoid Volvulus - worsening abdominal distention and no bowel movement over the last several days - GI consulted at 2358 on 05/08/22 - Surgery also vollowing - volvulus demonstrated on CT on 9/30 CT at 23:25  # PE was on heparin- stopped per pharmacy at 1000 on 05/07/22  # HFpEF  Plan:  Emergent Colonoscopy Will attempt to leave rectal tube in place to allow for decompression and prevent reforming of volvulus Pt at high risk given recent PE for mortality, perforation. Pt has been counseled on this. He reports he is willing to undergo procedure Labs reviewed- Lactate wnl; no leukocytosis Poor surgical candidate No alternative decision makers available  Colonoscopy with possible biopsy, control of bleeding, polypectomy, and interventions as necessary has been discussed with the patient. Due to the patient's poor insight and current condition, and indication for emergent decompression, we will proceed with colonoscopy. I spoke with the patient about the indication, nature, and risks of the procedure including but not limited to death, bleeding, perforation, missed neoplasm/lesions, cardiorespiratory compromise, and reaction to medications. Opportunity for questions was given and appropriate answers were provided. Patient reports that he understands and is amenable to undergoing the procedure.   I personally performed the service.  Management of other medical comorbidities as per primary team  Thank you for allowing us to participate in this patient's care. Please don't hesitate to call if any questions or concerns arise.   Besnik Febus Michael   Bea Duren,  DO [facility]  Portions of the record may have been created with voice recognition software. Occasional wrong-word or 'sound-a-like' substitutions may have occurred due to the inherent limitations of voice recognition software.  Read the chart carefully and recognize, using context, where substitutions may have occurred.  

## 2022-05-09 NOTE — H&P (View-Only) (Signed)
Inpatient Consultation   Patient ID: [name] is a 81 y.o. male.  Requesting Provider: Sharion Settler, NP  Date of Admission: 05/02/2022  Date of Consult: 05/09/22   Reason for Consultation: Sigmoid Volvulus   Patient's Chief Complaint:   Chief Complaint  Patient presents with   Shortness of Breath    81 year old Caucasian male c HFpEF admitted to the hospital on September 24 for shortness of breath where he was found to have a pulmonary embolism and started on heparin drip and abx for UTI. Pt is unable to provide history and most is gained from chart review and in conversation with nursing and surgeon.  Over the last few days, the patient has had worsening abdominal distention. He has not had a bowel movement in several days per nursing. NGT in place to IWS. KUB demonstrates significant gaseous distention. CT was performed on 9/30 at 2325 demonstrating sigmoid volvulus without free air. GI service was contacted at 1158.  Pt's mentation fluctuates per chart review and he is unable to make his own decisions with poor insight per palliative care team. No alternative decision maker has been identified.  Abdomen is grossly distended and taut. He denies abdominal pain. He repeatedly asks about having something to drink.  Heparin gtt stopped at 1000 on 05/07/22. Eliquis was on 9/26 at 4 am Denies family history of gastrointestinal disease and malignancy per chart review Previous Endoscopies: unknown; pt denies previous colonoscopy    Past Medical History:  Diagnosis Date   Bipolar 1 disorder ([place])    C. difficile diarrhea 05/2016   history of ...   CHF (congestive heart failure) (HCC)    Chronic kidney disease 05/21/2016   acute renal failure with sepsis and uti   COPD (chronic obstructive pulmonary disease) (HCC)    DVT (deep venous thrombosis) (HCC)    GERD (gastroesophageal reflux disease)    Hematuria    High cholesterol    History of kidney stones     Hypertension    Penile erosion 2018   d/t frequent foley catheters   Schizophrenia ([place])    Seizure ([place])    on phenobarb   Urinary retention    frequently requiring foley placement   VRE (vancomycin resistant enterococcus) culture positive    urine    Past Surgical History:  Procedure Laterality Date   EXTRACORPOREAL SHOCK WAVE LITHOTRIPSY Right 05/05/2017   Procedure: EXTRACORPOREAL SHOCK WAVE LITHOTRIPSY (ESWL);  Surgeon: Hollice Espy, MD;  Location: [facility] ORS;  Service: Urology;  Laterality: Right;   IR CATHETER TUBE CHANGE  06/23/2017   IR CATHETER TUBE CHANGE  06/28/2017    Allergies  Allergen Reactions   Penicillin G Rash and Other (See Comments)    Convulsions.    Has patient had a PCN reaction causing immediate rash, facial/tongue/throat swelling, SOB or lightheadedness with hypotension: not sure Has patient had a PCN reaction causing severe rash involving mucus membranes or skin necrosis: not sure Has patient had a PCN reaction that required hospitalization: not sure Has patient had a PCN reaction occurring within the last 10 years: not sure If all of the above answers are "NO", then may proceed with Cephalosporin use.    Family History  Problem Relation Age of Onset   Cancer Mother    Prostate cancer Neg Hx    Kidney disease Neg Hx    Kidney cancer Neg Hx    Bladder Cancer Neg Hx     Social History   Tobacco Use  Smoking status: Never    Passive exposure: Never   Smokeless tobacco: Never  Vaping Use   Vaping Use: Never used  Substance Use Topics   Alcohol use: No   Drug use: No     Pertinent GI related history and allergies were reviewed with the patient  Review of Systems  Unable to perform ROS: Mental status change  Respiratory:  Negative for shortness of breath.   Cardiovascular:  Negative for chest pain.  Gastrointestinal:  Positive for abdominal distention and constipation. Negative for abdominal pain, nausea and vomiting.     Medications Home Medications No current facility-administered medications on file prior to encounter.   Current Outpatient Medications on File Prior to Encounter  Medication Sig Dispense Refill   ammonium lactate (AMLACTIN) 12 % cream Apply 1 Application topically every 12 (twelve) hours.     ARIPiprazole (ABILIFY) 20 MG tablet Take 20 mg by mouth in the morning.     ascorbic acid (VITAMIN C) 500 MG tablet Take 1 tablet (500 mg total) by mouth 2 (two) times daily.     buPROPion (BUDEPRION XL) 300 MG 24 hr tablet Take 1 tablet (300 mg total) by mouth daily. 30 tablet 0   carvedilol (COREG) 25 MG tablet Take 25 mg by mouth 2 (two) times daily.     cetirizine (ZYRTEC) 10 MG tablet Take 10 mg by mouth in the morning.     cholecalciferol (VITAMIN D3) 25 MCG (1000 UNIT) tablet Take 1,000 Units by mouth in the morning.     Cranberry 250 MG CAPS Take 250 mg by mouth 2 (two) times daily.     Dextromethorphan HBr 15 MG CAPS Take 2 capsules by mouth every 12 (twelve) hours.     ferrous sulfate 325 (65 FE) MG tablet Take 325 mg by mouth daily with breakfast.     finasteride (PROSCAR) 5 MG tablet Take 1 tablet (5 mg total) by mouth daily. 30 tablet 0   folic acid (FOLVITE) 1 MG tablet Take 1 mg by mouth in the morning.     furosemide (LASIX) 20 MG tablet Take 20 mg by mouth 2 (two) times daily.     lisinopril (ZESTRIL) 10 MG tablet Take 10 mg by mouth daily.     Multiple Vitamin (MULTI-VITAMINS) TABS Take 1 tablet by mouth daily.     PHENobarbital (LUMINAL) 64.8 MG tablet Take 64.8 mg by mouth every other day.     potassium chloride SA (KLOR-CON M) 20 MEQ tablet Take 20 mEq by mouth daily.     pravastatin (PRAVACHOL) 10 MG tablet Take 10 mg by mouth at bedtime.     Suvorexant 5 MG TABS Take 5 mg by mouth at bedtime.     zinc oxide 20 % ointment Apply 1 application. topically 2 (two) times daily. (Apply to legs)     albuterol (VENTOLIN HFA) 108 (90 Base) MCG/ACT inhaler Inhale 2 puffs into the lungs  every 6 (six) hours as needed for wheezing or shortness of breath.     tamsulosin (FLOMAX) 0.4 MG CAPS capsule Take 0.4 mg by mouth daily. (Patient not taking: Reported on 05/02/2022)     Pertinent GI related medications were reviewed with the patient  Inpatient Medications  Current Facility-Administered Medications:    0.9 %  sodium chloride infusion, 250 mL, Intravenous, PRN, Donnamae Jude, MD, Stopped at 05/07/22 1030   0.9 %  sodium chloride infusion, , Intravenous, Continuous, Shawna Clamp, MD, Last Rate: 50 mL/hr at 05/08/22 1817, New  Bag at 05/08/22 1817   acetaminophen (TYLENOL) tablet 650 mg, 650 mg, Per Tube, Q6H PRN **OR** acetaminophen (TYLENOL) suppository 650 mg, 650 mg, Rectal, Q6H PRN, Shawna Clamp, MD   albuterol (PROVENTIL) (2.5 MG/3ML) 0.083% nebulizer solution 2.5 mg, 2.5 mg, Nebulization, Q2H PRN, Donnamae Jude, MD   ARIPiprazole (ABILIFY) tablet 20 mg, 20 mg, Per Tube, Daily, Shawna Clamp, MD, 20 mg at 05/08/22 1056   ascorbic acid (VITAMIN C) tablet 500 mg, 500 mg, Per Tube, BID, Shawna Clamp, MD, 500 mg at 05/08/22 2038   buPROPion [facility]) tablet 150 mg, 150 mg, Per Tube, BID, Shawna Clamp, MD, 150 mg at 05/08/22 2039   carvedilol (COREG) tablet 25 mg, 25 mg, Per Tube, BID, Shawna Clamp, MD, 25 mg at 05/08/22 2039   cefTRIAXone (ROCEPHIN) 1 g in sodium chloride 0.9 % 100 mL IVPB, 1 g, Intravenous, Q24H, Chavez, Abigail, NP, Last Rate: 200 mL/hr at 05/08/22 0115, 1 g at 05/08/22 0115   Chlorhexidine Gluconate Cloth 2 % PADS 6 each, 6 each, Topical, Daily, Fritzi Mandes, MD, 6 each at 05/08/22 1004   cholecalciferol (VITAMIN D3) 25 MCG (1000 UNIT) tablet 1,000 Units, 1,000 Units, Per Tube, q AM, Shawna Clamp, MD, 1,000 Units at 05/08/22 1055   dextromethorphan (DELSYM) 30 MG/5ML liquid 30 mg, 5 mL, Per Tube, Q12H, Shawna Clamp, MD, 30 mg at 05/08/22 1056   ferrous sulfate 220 (44 Fe) MG/5ML solution 325 mg, 325 mg, Per Tube, Q breakfast, Shawna Clamp,  MD, 325 mg at 05/08/22 1055   finasteride (PROSCAR) tablet 5 mg, 5 mg, Oral, Daily, Shawna Clamp, MD, 5 mg at 05/08/22 1054   fluticasone furoate-vilanterol (BREO ELLIPTA) 100-25 MCG/ACT 1 puff, 1 puff, Inhalation, Daily, Donnamae Jude, MD, 1 puff at [id number]   folic acid (FOLVITE) tablet 1 mg, 1 mg, Per Tube, q AM, Shawna Clamp, MD, 1 mg at 05/08/22 1054   guaiFENesin (ROBITUSSIN) 100 MG/5ML liquid 5 mL, 5 mL, Per Tube, Q4H PRN, Shawna Clamp, MD   HYDROcodone-acetaminophen (NORCO/VICODIN) 5-325 MG per tablet 1-2 tablet, 1-2 tablet, Per Tube, Q4H PRN, Shawna Clamp, MD, 2 tablet at 05/08/22 [id number]   insulin aspart (novoLOG) injection 0-15 Units, 0-15 Units, Subcutaneous, TID WC, Donnamae Jude, MD, 2 Units at 05/06/22 1616   ipratropium-albuterol (DUONEB) 0.5-2.5 (3) MG/3ML nebulizer solution 3 mL, 3 mL, Nebulization, Q6H, Donnamae Jude, MD, 3 mL at 05/08/22 2018   lactulose ([place]) 10 GM/15ML solution 10 g, 10 g, Per Tube, Q6H PRN, Shawna Clamp, MD, 10 g at 05/08/22 1311   loratadine (CLARITIN) tablet 10 mg, 10 mg, Per Tube, Daily, Shawna Clamp, MD, 10 mg at 05/08/22 1056   morphine (PF) 2 MG/ML injection 1 mg, 1 mg, Intravenous, Q6H PRN, Shawna Clamp, MD, 1 mg at 05/08/22 2039   multivitamin with minerals tablet 1 tablet, 1 tablet, Per Tube, Daily, Shawna Clamp, MD, 1 tablet at 05/08/22 1054   Oral care mouth rinse, 15 mL, Mouth Rinse, PRN, Fritzi Mandes, MD   Oral care mouth rinse, 15 mL, Mouth Rinse, 4 times per day, Fritzi Mandes, MD, 15 mL at 05/08/22 1640   Oral care mouth rinse, 15 mL, Mouth Rinse, PRN, Fritzi Mandes, MD   PHENObarbital (LUMINAL) injection 65 mg, 65 mg, Intravenous, Pollyann Kennedy, NP, 65 mg at 05/08/22 1006   polyethylene glycol (MIRALAX / GLYCOLAX) packet 17 g, 17 g, Per Tube, Daily PRN, Shawna Clamp, MD   pravastatin (PRAVACHOL) tablet 10 mg, 10 mg, Per Tube, QHS,  Shawna Clamp, MD, 10 mg at 05/08/22 2038   sodium chloride flush (NS) 0.9 %  injection 3 mL, 3 mL, Intravenous, Q12H, Donnamae Jude, MD, 3 mL at 05/08/22 2039   sodium chloride flush (NS) 0.9 % injection 3 mL, 3 mL, Intravenous, PRN, Donnamae Jude, MD   umeclidinium bromide (INCRUSE ELLIPTA) 62.5 MCG/ACT 1 puff, 1 puff, Inhalation, Daily, Donnamae Jude, MD, 1 puff at 05/07/22 1018  sodium chloride Stopped (05/07/22 1030)   sodium chloride 50 mL/hr at 05/08/22 1817   cefTRIAXone (ROCEPHIN)  IV 1 g (05/08/22 0115)    sodium chloride, acetaminophen **OR** acetaminophen, albuterol, guaiFENesin, HYDROcodone-acetaminophen, lactulose, morphine injection, mouth rinse, mouth rinse, polyethylene glycol, sodium chloride flush   Objective   Vitals:   05/08/22 1657 05/08/22 1914 05/08/22 2018 05/08/22 2318  BP: 130/88 (!) 143/96  137/86  Pulse: 84 89  88  Resp: (!) 21 16  17   Temp: 98.1 F (36.7 C) 98.3 F (36.8 C)  97.6 F (36.4 C)  TempSrc:      SpO2: 98% 96% 96% 90%  Weight:      Height:         Physical Exam Vitals and nursing note reviewed.  Constitutional:      General: He is not in acute distress.    Appearance: He is obese. He is ill-appearing. He is not toxic-appearing or diaphoretic.  HENT:     Head: Normocephalic and atraumatic.     Nose: Nose normal.     Comments: NGT in place to LIWS    Mouth/Throat:     Mouth: Mucous membranes are dry.  Eyes:     General: No scleral icterus.    Extraocular Movements: Extraocular movements intact.  Cardiovascular:     Rate and Rhythm: Normal rate and regular rhythm.     Heart sounds: Normal heart sounds. No murmur heard.    No friction rub. No gallop.  Pulmonary:     Effort: No respiratory distress.     Breath sounds: No wheezing, rhonchi or rales.     Comments: Coarse breath sounds bilaterally Abdominal:     General: Abdomen is flat. Bowel sounds are normal. There is distension (taut).     Palpations: Abdomen is soft.     Tenderness: There is no abdominal tenderness. There is no guarding or rebound.      Comments: High pitched bowel sounds. Non-peritoneal at this time.  Musculoskeletal:     Cervical back: Neck supple.  Skin:    General: Skin is warm and dry.     Coloration: Skin is not jaundiced or pale.  Neurological:     Mental Status: He is alert.     Comments: Oriented to name, birth date, place. Seems to have poor insight into severity of illness     Laboratory Data Recent Labs  Lab 05/06/22 0453 05/07/22 0821 05/08/22 0434  WBC 8.4 8.7 8.5  HGB 12.0* 11.3* 11.4*  HCT 37.1* 35.1* 35.7*  PLT 273 225 271   Recent Labs  Lab 05/02/22 2121 05/03/22 0406 05/06/22 0453 05/06/22 1737 05/07/22 0821 05/07/22 1747 05/08/22 0434 05/08/22 1740  NA 144   < > 143  --  142  --  145  --   K 2.9*   < > 2.4*   < > 2.9* 3.1* 2.8* 3.4*  CL 107   < > 113*  --  117*  --  118*  --   CO2 27   < > 22  --  21*  --  17*  --   BUN 14   < > 49*  --  54*  --  52*  --   CALCIUM 8.7*   < > 8.5*  --  8.4*  --  8.7*  --   PROT 8.1  --   --   --   --   --   --   --   BILITOT 0.9  --   --   --   --   --   --   --   ALKPHOS 100  --   --   --   --   --   --   --   ALT 10  --   --   --   --   --   --   --   AST 16  --   --   --   --   --   --   --   GLUCOSE 143*   < > 127*  --  108*  --  110*  --    < > = values in this interval not displayed.   Recent Labs  Lab 05/02/22 2121  INR 1.2    No results for input(s): "LIPASE" in the last 72 hours.      Imaging Studies: CT ABDOMEN PELVIS WO CONTRAST  Result Date: 05/08/2022 CLINICAL DATA:  Suspected bowel obstruction. EXAM: CT ABDOMEN AND PELVIS WITHOUT CONTRAST TECHNIQUE: Multidetector CT imaging of the abdomen and pelvis was performed following the standard protocol without IV contrast. RADIATION DOSE REDUCTION: This exam was performed according to the departmental dose-optimization program which includes automated exposure control, adjustment of the mA and/or kV according to patient size and/or use of iterative reconstruction technique.  COMPARISON:  March 25, 2022 FINDINGS: Lower chest: Marked severity consolidation is seen within the posterior aspect of the right lung base. Mild posterolateral left basilar atelectasis is noted. Hepatobiliary: No focal liver abnormality is seen. The gallbladder is contracted without evidence of gallstones, gallbladder wall thickening, or biliary dilatation. Pancreas: Unremarkable. No pancreatic ductal dilatation or surrounding inflammatory changes. Spleen: Normal in size without focal abnormality. Adrenals/Urinary Tract: Adrenal glands are unremarkable. Kidneys are normal in size with mild bilateral renal cortical thinning noted. Multiple cysts are seen within the right kidney. The largest measures approximately 6.9 cm x 4.9 cm. Numerous bilateral renal and proximal ureteral calculi of various sizes are seen. The largest on the right measures approximately 1.3 cm, while the largest on the left measures 1.0 cm. A suprapubic catheter is seen within an empty urinary bladder. Stomach/Bowel: A nasogastric tube is seen with its distal tip noted within the body of the stomach. Numerous markedly dilated large bowel loops are seen throughout the abdomen and pelvis. A transition zone is seen within the mid sigmoid colon. Twisting of the mesentery is noted within this region (axial CT images 78 through 87, CT series 2). The small bowel loops are predominantly decompressed. The appendix is normal in appearance. Vascular/Lymphatic: Aortic atherosclerosis. No enlarged abdominal or pelvic lymph nodes. Reproductive: The prostate gland is not identified. Other: There is moderate severity anasarca involving predominantly the posterolateral aspects of the abdominal and pelvic walls. No abdominopelvic ascites. Musculoskeletal: Marked severity multilevel degenerative changes seen throughout the lumbar spine. IMPRESSION: 1. Sigmoid volvulus with subsequent high-grade colonic obstruction and a transition zone seen within the mid sigmoid  colon. 2. Marked severity right lower lobe consolidation, consistent with pneumonia. 3. Numerous bilateral renal and proximal ureteral  calculi of various sizes. 4. Multiple right renal cysts (B[place]niak 2). No additional follow-up or imaging is recommended. This recommendation follows ACR consensus guidelines: Management of the Incidental Renal Mass on CT: A White Paper of the ACR Incidental Findings Committee. J Am Coll Radiol [phone]. 5. Moderate severity anasarca. 6. Aortic atherosclerosis. Electronically Signed   By: Virgina Norfolk M.D.   On: 05/08/2022 23:25   DG Abd 1 View  Result Date: 05/08/2022 CLINICAL DATA:  [id number] NG tube placement EXAM: ABDOMEN - 1 VIEW COMPARISON:  May 08, 2022 FINDINGS: Incomplete visualization of the abdomen laterally and the pelvis. Carina is not visualized, limiting evaluation. Enteric tube tip projects over the expected region of the proximal stomach. Side port projects over the region of the distal esophagus. Marked gaseous dilation of loops of bowel, similar in comparison to prior. IMPRESSION: 1. Enteric tube side port projects over the distal esophagus. 2. Similar appearance of diffuse gaseous dilation of loops of bowel, possibly ileus. If concern for obstruction, recommend CT. Electronically Signed   By: Valentino Saxon M.D.   On: 05/08/2022 12:08   DG Abd 1 View  Result Date: 05/08/2022 CLINICAL DATA:  Check gastric catheter placement EXAM: ABDOMEN - 1 VIEW COMPARISON:  Film from the previous day. FINDINGS: Gastric catheter is again noted in the stomach. Scattered large and small bowel gas is again seen and stable. IMPRESSION: Gastric catheter in the stomach. Electronically Signed   By: Inez Catalina M.D.   On: 05/08/2022 00:32   DG Abd 1 View  Result Date: 05/07/2022 CLINICAL DATA:  Check gastric catheter placement EXAM: ABDOMEN - 1 VIEW COMPARISON:  None Available. FINDINGS: Gastric catheter is noted within the stomach. Scattered large and small  bowel gas is again identified. This likely represents an ileus. Correlate with clinical exam. Degenerative changes of lumbar spine are noted. IMPRESSION: Gastric catheter in place. Considerable gaseous distension of the bowel which may represent an ileus. Electronically Signed   By: Inez Catalina M.D.   On: 05/07/2022 19:32    Assessment:   # Sigmoid Volvulus - worsening abdominal distention and no bowel movement over the last several days - GI consulted at 2358 on 05/08/22 - Surgery also vollowing - volvulus demonstrated on CT on 9/30 CT at 23:25  # PE was on heparin- stopped per pharmacy at 1000 on 05/07/22  # HFpEF  Plan:  Emergent Colonoscopy Will attempt to leave rectal tube in place to allow for decompression and prevent reforming of volvulus Pt at high risk given recent PE for mortality, perforation. Pt has been counseled on this. He reports he is willing to undergo procedure Labs reviewed- Lactate wnl; no leukocytosis Poor surgical candidate No alternative decision makers available  Colonoscopy with possible biopsy, control of bleeding, polypectomy, and interventions as necessary has been discussed with the patient. Due to the patient's poor insight and current condition, and indication for emergent decompression, we will proceed with colonoscopy. I spoke with the patient about the indication, nature, and risks of the procedure including but not limited to death, bleeding, perforation, missed neoplasm/lesions, cardiorespiratory compromise, and reaction to medications. Opportunity for questions was given and appropriate answers were provided. Patient reports that he understands and is amenable to undergoing the procedure.   I personally performed the service.  Management of other medical comorbidities as per primary team  Thank you for allowing [place] to participate in this patient's care. Please don't hesitate to call if any questions or concerns arise.   Richardo Hanks  Virgina Jock,  DO [facility]  Portions of the record may have been created with voice recognition software. Occasional wrong-word or 'sound-a-like' substitutions may have occurred due to the inherent limitations of voice recognition software.  Read the chart carefully and recognize, using context, where substitutions may have occurred.

## 2022-05-09 NOTE — Op Note (Addendum)
[facility] Patient Name: [name] Procedure Date: 05/09/2022 1:57 AM MRN: [id number] Account #: [id number] Date of Birth: 1940-12-26 Admit Type: Inpatient Age: 81 Room: [facility] ENDO ROOM 4 Gender: Male Note Status: Supervisor Override Instrument Name: Jasper Riling [id number] Procedure:             Colonoscopy Indications:           Volvulus, For therapy of volvulus Providers:             Annamaria Helling DO, DO Medicines:             Monitored Anesthesia Care Complications:         No immediate complications. Estimated blood loss: None. Procedure:             Pre-Anesthesia Assessment:                        - Prior to the procedure, a History and Physical was                         performed, and patient medications and allergies were                         reviewed. The patient is competent. The risks and                         benefits of the procedure and the sedation options and                         risks were discussed with the patient. All questions                         were answered and informed consent was obtained.                         Patient identification and proposed procedure were                         verified by the physician, the nurse, the anesthetist                         and the technician in the endoscopy suite. Mental                         Status Examination: alert but confused. Airway                         Examination: normal oropharyngeal airway and neck                         mobility. Respiratory Examination: rhonchi. CV                         Examination: regular rate and rhythm. Prophylactic                         Antibiotics: The patient does not require prophylactic                         antibiotics. Prior  Anticoagulants: The patient has                         taken no previous anticoagulant or antiplatelet                         agents. ASA Grade Assessment: IV - A patient with                          severe systemic disease that is a constant threat to                         life. After reviewing the risks and benefits, the                         patient was deemed in satisfactory condition to                         undergo the procedure. The anesthesia plan was to use                         monitored anesthesia care (MAC). Immediately prior to                         administration of medications, the patient was                         re-assessed for adequacy to receive sedatives. The                         heart rate, respiratory rate, oxygen saturations,                         blood pressure, adequacy of pulmonary ventilation, and                         response to care were monitored throughout the                         procedure. The physical status of the patient was                         re-assessed after the procedure.                        After obtaining informed consent, the colonoscope was                         passed under direct vision. Throughout the procedure,                         the patient's blood pressure, pulse, and oxygen                         saturations were monitored continuously. The                         Colonoscope was introduced through the anus and  advanced to the the descending colon for evaluation.                         This was the intended extent. The colonoscopy was                         performed without difficulty. The patient tolerated                         the procedure well. The quality of the bowel                         preparation was poor as it was unprepped procedure.                         Descending colon beyond volvulus were photographed. Findings:      A volvulus with viable appearing mucosa was found in the sigmoid colon.       Due to large amount of stool, not all of mucosa could be visualized, but       of what could be, it appeared viable without ischemia.  Advancing the       scope was able to unfurl the volvulus. Air and stool were suctioned to       decompress the patient's colon. The scope was removed and the abdomen       was soft and far less distended. Outside of the colon, a snare was used       (through the colonoscope chanel) to hold an external 28 Fr soft chest       tube adjacent to it (as a rectal decompression device was not       available). The colonoscope was then reintroduced slowly and advanced       back to area just proximal to the previously noted volvulus. The tube       was then released from the snare and left in place and the colonscope       and snare were removed. Large amount of stool drained into foley bag to       gravity immediately. Again, abdomen was much softer post procedure than       prior demonstrating relief.      Retroflexion was not performed due to stool burden and tube remaining in       place. Decompression of the volvulus was attempted and was successful,       with complete decompression achieved. Following the maneuver, a tube was       placed to maintain the decompression. Estimated blood loss: none. as       above Impression:            - Volvulus. Successful complete decompression achieved.                        - No specimens collected. Recommendation:        - Return patient to hospital ward for ongoing care.                        - NPO.                        - Continue present medications.                        -  be extremely cautious with moving patient around as                         rectal tube is only secured from the outside. This                         tube needs to remain in place for 48-72 hours.                        KUB has been ordered                        - The findings and recommendations were discussed with                         the referring physician.                        - The findings and recommendations were discussed with                         the  surgeon.                        - The findings and recommendations were discussed with                         the patient. Procedure Code(s):     --- Professional ---                        [phone], 48, Colonoscopy, flexible; with decompression                         (for pathologic distention) (eg, volvulus, megacolon),                         including placement of decompression tube, when                         performed Diagnosis Code(s):     --- Professional ---                        K56.2, Volvulus CPT copyright 2019 [employer]. All rights reserved. The codes documented in this report are preliminary and upon coder review may  be revised to meet current compliance requirements. Attending Participation:      I personally performed the entire procedure. Volney American, DO Annamaria Helling DO, DO 05/09/2022 3:10:36 AM This report has been signed electronically. Number of Addenda: 0 Note Initiated On: 05/09/2022 1:57 AM Total Procedure Duration: 0 hours 16 minutes 23 seconds  Estimated Blood Loss:  Estimated blood loss: none.      [facility]

## 2022-05-09 NOTE — Progress Notes (Addendum)
Nutrition Follow-up RD working remotely.  DOCUMENTATION CODES:   Non-severe (moderate) malnutrition in context of chronic illness  INTERVENTION:  - weigh patient today.  - will monitor for ability to start tube feeding.    NUTRITION DIAGNOSIS:   Moderate Malnutrition related to chronic illness as evidenced by mild fat depletion, mild muscle depletion, percent weight loss. -ongoing  GOAL:   Patient will meet greater than or equal to 90% of their needs -to be met with TF regimen  MONITOR:   TF tolerance, Diet advancement, Labs, Weight trends, Skin  REASON FOR ASSESSMENT:   Consult Enteral/tube feeding initiation and management  ASSESSMENT:   81 y/o male with h/o HTN, HLD, GERD, COPD, CHF, seizures, BPH, depression and DVT who is admitted with UTI and acute PE.  POD #0 emergent decompressive colonoscopy.  Initial assessment done in person by RD on 9/28. Patient noted to be a/o to self only today. He was previously on a Dysphagia 3, honey-thick liquids diet with no meal completion percentages documented in the flow sheet. He is now NPO. NGT placed in R nare yesterday at 1035.   Abdominal x-ray report yesterday at 0310 states that tip is in the stomach and side port is visualized but likely near GE junction. A new abdominal x-ray was completed today at 0740 which indicates that patient has gas-filled loops of small bowel and colon, colonic dilation which is decreased from prior imaging.   NGT being used for decompression at this time. Patient to remain NPO, no tube feeding to be initiated at this time.  He has not been weighed since 9/25. On 9/29 he was noted to have moderate pitting edema to BLE and yesterday noted to have deep pitting edema to BLE. No update yet today.   Labs reviewed; CBG: 101 mg/dl, Na: 147 mmol/l, K: 3.2 mmol/l, BUN: 50 mg/dl, creatinine: 1.85 mg/dl, GFR: 37 ml/min.  Medications reviewed; 500 mg ascorbic acid per tube BID, 1000 units cholecalciferol per  tube/day, 325 mg ferrous sulfate per tube/day, 1 mg folvite per tube/day, sliding scale novolog, 1 tablet multivitamin with minerals per tube/day, 40 mEq Klor Con x1 dose 9/30, 10 mEq IV KCl x4 runs 9/29 and x6 runs 9/30.  IVF; D5-1/2 NS-20 mEq IV KCl @ 125 ml/hr (510 kcal/24 hrs).   Diet Order:   Diet Order             Diet NPO time specified  Diet effective now                   EDUCATION NEEDS:   Education needs have been addressed  Skin:  Skin Assessment: Skin Integrity Issues: Skin Integrity Issues:: Stage II, Unstageable Stage II: rt and lt buttocks Unstageable: DPTI to lt lower heel  Last BM:  10/1 (type 7 x1, large amount)  Height:   Ht Readings from Last 1 Encounters:  05/03/22 _0  (1.727 m)    Weight:   Wt Readings from Last 1 Encounters:  05/03/22 95 kg     BMI:  Body mass index is 31.84 kg/m.  Estimated Nutritional Needs:  Kcal:  1900-2200kcal/day Protein:  95-110g/day Fluid:  1.8-2.1L/day     Jarome Matin, MS, RD, LDN, CNSC Clinical Dietitian PRN/Relief staff On-call/weekend pager # available in [facility]

## 2022-05-09 NOTE — Consult Note (Signed)
PHARMACY CONSULT NOTE - FOLLOW UP  Pharmacy Consult for Electrolyte Monitoring and Replacement   Recent Labs: Potassium (mmol/L)  Date Value  05/09/2022 3.2 (L)  05/09/2022 3.2 (L)  10/03/2012 3.1 (L)   Magnesium (mg/dL)  Date Value  05/08/2022 2.4  10/02/2012 1.3 (L)   Calcium (mg/dL)  Date Value  05/09/2022 8.9   Calcium, Total (mg/dL)  Date Value  10/03/2012 8.2 (L)   Albumin (g/dL)  Date Value  05/09/2022 2.7 (L)  10/02/2012 3.4   Phosphorus (mg/dL)  Date Value  05/06/2022 3.3   Sodium (mmol/L)  Date Value  05/09/2022 147 (H)  10/03/2012 141    Assessment: 81 y.o. male with medical history significant of HTN, HLD, GERD, COPD, CHF (diastolic), seizure disorder, BPH who comes in today with presumed COPD exacerbation. Found to have a PE, started on heparin infusion. Potassium continues to be low   Goal of Therapy:  WNL  Plan: K 3.2. Remains low despite repletion with potassium chloride 10 mEq IV x 6 Give 10 mEq IV x 6(ordered by overnight attending)  Will recheck all electrolytes with AM labs tomorrow  [place] Pharmacist 05/09/2022 7:56 AM

## 2022-05-09 NOTE — Anesthesia Preprocedure Evaluation (Addendum)
Anesthesia Evaluation  Patient identified by MRN, date of birth, ID band Patient awake    Reviewed: Allergy & Precautions, NPO status , Patient's Chart, lab work & pertinent test results  Airway Mallampati: III  TM Distance: >3 FB Neck ROM: full    Dental  (+) Dental Advidsory Given, Poor Dentition   Pulmonary shortness of breath and at rest, COPD,    Pulmonary exam normal        Cardiovascular hypertension, +CHF  Normal cardiovascular exam     Neuro/Psych Seizures -,  PSYCHIATRIC DISORDERS    GI/Hepatic negative GI ROS, Neg liver ROS,   Endo/Other  negative endocrine ROS  Renal/GU negative Renal ROS  negative genitourinary   Musculoskeletal   Abdominal   Peds  Hematology  (+) Blood dyscrasia, anemia ,   Anesthesia Other Findings Patient admitted to due respiratory failure 2/2 pulmonary embolism. Patient states he feels a little short of breath sitting in his bed. Heparin was stopped 4 hours ago.   Past Medical History: No date: Bipolar 1 disorder ([place]) 05/2016: C. difficile diarrhea     Comment:  history of ... No date: CHF (congestive heart failure) ([place]) 05/21/2016: Chronic kidney disease     Comment:  acute renal failure with sepsis and uti No date: COPD (chronic obstructive pulmonary disease) ([facility]) No date: DVT (deep venous thrombosis) ([facility]) No date: GERD (gastroesophageal reflux disease) No date: Hematuria No date: High cholesterol No date: History of kidney stones No date: Hypertension 2018: Penile erosion     Comment:  d/t frequent foley catheters No date: Schizophrenia ([place]) No date: Seizure ([place])     Comment:  on phenobarb No date: Urinary retention     Comment:  frequently requiring foley placement No date: VRE (vancomycin resistant enterococcus) culture positive     Comment:  urine  Past Surgical History: 05/05/2017: EXTRACORPOREAL SHOCK WAVE LITHOTRIPSY; Right     Comment:  Procedure:  EXTRACORPOREAL SHOCK WAVE LITHOTRIPSY (ESWL);              Surgeon: Hollice Espy, MD;  Location: [facility] ORS;                Service: Urology;  Laterality: Right; 06/23/2017: IR CATHETER TUBE CHANGE 06/28/2017: IR CATHETER TUBE CHANGE  BMI    Body Mass Index: 31.84 kg/m      Reproductive/Obstetrics negative OB ROS                            Anesthesia Physical Anesthesia Plan  ASA: 4 and emergent  Anesthesia Plan: General   Post-op Pain Management: Minimal or no pain anticipated   Induction: Intravenous  PONV Risk Score and Plan: 3 and Propofol infusion, TIVA and Ondansetron  Airway Management Planned: Nasal Cannula  Additional Equipment: None  Intra-op Plan:   Post-operative Plan:   Informed Consent: I have reviewed the patients History and Physical, chart, labs and discussed the procedure including the risks, benefits and alternatives for the proposed anesthesia with the patient or authorized representative who has indicated his/her understanding and acceptance.     Dental advisory given  Plan Discussed with: CRNA and Surgeon  Anesthesia Plan Comments: (Discussed risks of anesthesia with patient, including possibility of difficulty with spontaneous ventilation under anesthesia necessitating airway intervention, PONV, and rare risks such as cardiac or respiratory or neurological events, and allergic reactions. Discussed the role of CRNA in patient's perioperative care. Patient understands.)  Anesthesia Quick Evaluation  

## 2022-05-09 NOTE — Progress Notes (Signed)
Patient ID: [name], male   DOB: November 11, 1940, 81 y.o.   MRN: [id number]     SURGICAL PROGRESS NOTE   Hospital Day(s): 7.   Interval History: Patient seen and examined, no acute events or new complaints overnight. Patient reports feeling frustrated.  Frustration expressed due to not able to aid in the need of mittens to avoid removal of NGT and rectal tube.  Patient today found alert, he is oriented in person, time and place.  Patient had emergent endoscopic decompression of sigmoid volvulus last night.  He is abdominal distention has significantly improved.  Vital signs in last 24 hours: [min-max] current  Temp:  [97 F (36.1 C)-98.3 F (36.8 C)] 97.8 F (36.6 C) (10/01 0812) Pulse Rate:  [78-89] 83 (10/01 0812) Resp:  [16-22] 17 (10/01 0812) BP: (99-152)/(48-96) 152/54 (10/01 0812) SpO2:  [90 %-100 %] 96 % (10/01 0812)     Height: 5\' 8"  (172.7 cm) Weight: 95 kg BMI (Calculated): 31.85   Physical Exam:  Constitutional: alert, oriented x3 Respiratory: breathing non-labored at rest  Cardiovascular: Regular rate and rhythm Gastrointestinal: soft, non-tender, and distended (improved compared to yesterday)  Labs:     Latest Ref Rng & Units 05/09/2022   12:12 AM 05/08/2022    4:34 AM 05/07/2022    8:21 AM  CBC  WBC 4.0 - 10.5 K/uL 8.8  8.5  8.7   Hemoglobin 13.0 - 17.0 g/dL 05/09/2022  10.6  26.9   Hematocrit 39.0 - 52.0 % 37.1  35.7  35.1   Platelets 150 - 400 K/uL 272  271  225       Latest Ref Rng & Units 05/09/2022   12:12 AM 05/08/2022    5:40 PM 05/08/2022    4:34 AM  CMP  Glucose 70 - 99 mg/dL 05/10/2022   462   BUN 8 - 23 mg/dL 50   52   Creatinine 703 - 1.24 mg/dL 5.00   9.38   Sodium 1.82 - 145 mmol/L 147   145   Potassium 3.5 - 5.1 mmol/L 3.5 - 5.1 mmol/L 3.2    3.2  3.4  2.8   Chloride 98 - 111 mmol/L 121   118   CO2 22 - 32 mmol/L 18   17   Calcium 8.9 - 10.3 mg/dL 8.9   8.7   Total Protein 6.5 - 8.1 g/dL 6.7     Total Bilirubin 0.3 - 1.2 mg/dL 0.9     Alkaline Phos  38 - 126 U/L 64     AST 15 - 41 U/L 10     ALT 0 - 44 U/L 9       Imaging studies: Abdominal x-ray this morning shows persistent dilation of the large intestine.  Rectal tube in place.  The large intestine dilation is improved compared to preendoscopic decompression of sigmoid volvulus.  There is no free air or free fluid.  I personally evaluated the images.  Pending official report.   Assessment/Plan:  81 y.o. male with single problems, complicated by pertinent comorbidities including acute pulmonary embolism on heparin drip, aspiration pneumonia on antibiotic therapy, diastolic CHF, seizure disorder, hypokalemia, COPD.  Sigmoid volvulus -S/p endoscopic decompression with rectal tube placement -Abdominal physical exam significantly improved compared to yesterday.  Yesterday he had a tense, nontender abdominal distention -Today abdominal distention is soft, easily depressible, without pain -Endoscopic procedure shows no sign of large bowel ischemia -The endoscopic decompression and rectal tube buys time hopefully improve the large intestinal distention. -  Patient high risk for recurrence of ischemic volvulus.  Patient may need surgical intervention for the resection of the sigmoid colon and end colostomy creation.  This is a very extensive surgery in need of general anesthesia.  Patient with recent PE and aspiration pneumonia with low pulmonary reserve.  Patient is high risk for prolonged intubation if he get intubated for general anesthesia.  I will appreciate further investigation by palliative care or any pertinent committee from the hospital to discuss patient's current situation of not being able to make such a decision as consenting for possible extensive surgical intubation and possible need of prolonged mechanical ventilation. -Continue rectal tube in place -Continue n.p.o. due to persistent left intestinal dilation -I will continue to follow closely.  Arnold Long, MD

## 2022-05-09 NOTE — Progress Notes (Signed)
Inpatient Follow-up/Progress Note   Patient ID: [name] is a 81 y.o. male.  Overnight Events / Subjective Findings Emergent decompressive colonoscopy overnight. KUB improved. Abdomen remains soft post procedure.  Pt upset he has to wear mittens (has been pulling at tubes) and cannot have anything to eat/drink. KUB improved. Continues to put out stool  Review of Systems  Gastrointestinal:  Negative for abdominal distention (improved), abdominal pain, blood in stool, nausea and vomiting.     Medications  Current Facility-Administered Medications:    0.9 %  sodium chloride infusion, 250 mL, Intravenous, PRN, Reva BoresPratt, Tanya S, MD, Stopped at 05/07/22 1030   acetaminophen (TYLENOL) tablet 650 mg, 650 mg, Per Tube, Q6H PRN **OR** acetaminophen (TYLENOL) suppository 650 mg, 650 mg, Rectal, Q6H PRN, Cipriano BunkerKumar, Pardeep, MD   albuterol (PROVENTIL) (2.5 MG/3ML) 0.083% nebulizer solution 2.5 mg, 2.5 mg, Nebulization, Q2H PRN, Reva BoresPratt, Tanya S, MD   apixaban (ELIQUIS) tablet 10 mg, 10 mg, Per Tube, BID **FOLLOWED BY** [START ON 05/16/2022] apixaban (ELIQUIS) tablet 5 mg, 5 mg, Per Tube, BID, Cipriano BunkerKumar, Pardeep, MD   ARIPiprazole (ABILIFY) tablet 20 mg, 20 mg, Per Tube, Daily, Cipriano BunkerKumar, Pardeep, MD, 20 mg at 05/08/22 1056   ascorbic acid (VITAMIN C) tablet 500 mg, 500 mg, Per Tube, BID, Cipriano BunkerKumar, Pardeep, MD, 500 mg at 05/08/22 2038   buPROPion [facility](WELLBUTRIN) tablet 150 mg, 150 mg, Per Tube, BID, Cipriano BunkerKumar, Pardeep, MD, 150 mg at 05/08/22 2039   carvedilol (COREG) tablet 25 mg, 25 mg, Per Tube, BID, Cipriano BunkerKumar, Pardeep, MD, 25 mg at 05/08/22 2039   Chlorhexidine Gluconate Cloth 2 % PADS 6 each, 6 each, Topical, Daily, Enedina FinnerPatel, Sona, MD, 6 each at 05/08/22 1004   cholecalciferol (VITAMIN D3) 25 MCG (1000 UNIT) tablet 1,000 Units, 1,000 Units, Per Tube, q AM, Cipriano BunkerKumar, Pardeep, MD, 1,000 Units at 05/08/22 1055   dextromethorphan (DELSYM) 30 MG/5ML liquid 30 mg, 5 mL, Per Tube, Q12H, Cipriano BunkerKumar, Pardeep, MD, 30 mg at 05/08/22 1056    dextrose 5 % and 0.45 % NaCl with KCl 20 mEq/L infusion, , Intravenous, Continuous, Manuela SchwartzMorrison, Brenda, NP, Last Rate: 125 mL/hr at 05/09/22 0122, New Bag at 05/09/22 0122   feeding supplement (OSMOLITE 1.5 CAL) liquid 1,000 mL, 1,000 mL, Per Tube, Continuous, Cipriano BunkerKumar, Pardeep, MD   feeding supplement (PROSource TF20) liquid 60 mL, 60 mL, Per Tube, Daily, Cipriano BunkerKumar, Pardeep, MD   ferrous sulfate 220 (44 Fe) MG/5ML solution 325 mg, 325 mg, Per Tube, Q breakfast, Cipriano BunkerKumar, Pardeep, MD, 325 mg at 05/08/22 1055   finasteride (PROSCAR) tablet 5 mg, 5 mg, Oral, Daily, Cipriano BunkerKumar, Pardeep, MD, 5 mg at 05/08/22 1054   fluticasone furoate-vilanterol (BREO ELLIPTA) 100-25 MCG/ACT 1 puff, 1 puff, Inhalation, Daily, Reva BoresPratt, Tanya S, MD, 1 puff at 05/07/22 1017   folic acid (FOLVITE) tablet 1 mg, 1 mg, Per Tube, q AM, Cipriano BunkerKumar, Pardeep, MD, 1 mg at 05/08/22 1054   free water 165 mL, 165 mL, Per Tube, Q4H, Cipriano BunkerKumar, Pardeep, MD   guaiFENesin (ROBITUSSIN) 100 MG/5ML liquid 5 mL, 5 mL, Per Tube, Q4H PRN, Cipriano BunkerKumar, Pardeep, MD   HYDROcodone-acetaminophen (NORCO/VICODIN) 5-325 MG per tablet 1-2 tablet, 1-2 tablet, Per Tube, Q4H PRN, Cipriano BunkerKumar, Pardeep, MD, 2 tablet at 05/08/22 0623   insulin aspart (novoLOG) injection 0-15 Units, 0-15 Units, Subcutaneous, TID WC, Reva BoresPratt, Tanya S, MD, 2 Units at 05/06/22 1616   ipratropium-albuterol (DUONEB) 0.5-2.5 (3) MG/3ML nebulizer solution 3 mL, 3 mL, Nebulization, Q6H, Reva BoresPratt, Tanya S, MD, 3 mL at 05/09/22 0747   lactulose (  CHRONULAC) 10 GM/15ML solution 10 g, 10 g, Per Tube, Q6H PRN, Cipriano Bunker, MD, 10 g at 05/08/22 1311   loratadine (CLARITIN) tablet 10 mg, 10 mg, Per Tube, Daily, Cipriano Bunker, MD, 10 mg at 05/08/22 1056   morphine (PF) 2 MG/ML injection 1 mg, 1 mg, Intravenous, Q6H PRN, Cipriano Bunker, MD, 1 mg at 05/08/22 2039   multivitamin with minerals tablet 1 tablet, 1 tablet, Per Tube, Daily, Cipriano Bunker, MD, 1 tablet at 05/08/22 1054   Oral care mouth rinse, 15 mL, Mouth Rinse, PRN, Enedina Finner, MD   Oral care mouth rinse, 15 mL, Mouth Rinse, 4 times per day, Enedina Finner, MD, 15 mL at 05/08/22 2136   Oral care mouth rinse, 15 mL, Mouth Rinse, PRN, Enedina Finner, MD   PHENObarbital (LUMINAL) injection 65 mg, 65 mg, Intravenous, Connye Burkitt, NP, 65 mg at 05/08/22 1006   polyethylene glycol (MIRALAX / GLYCOLAX) packet 17 g, 17 g, Per Tube, Daily PRN, Cipriano Bunker, MD   potassium chloride (KLOR-CON) packet 40 mEq, 40 mEq, Per Tube, Once, Cipriano Bunker, MD   pravastatin (PRAVACHOL) tablet 10 mg, 10 mg, Per Tube, QHS, Cipriano Bunker, MD, 10 mg at 05/08/22 2038   sodium chloride flush (NS) 0.9 % injection 3 mL, 3 mL, Intravenous, Q12H, Reva Bores, MD, 3 mL at 05/08/22 2039   sodium chloride flush (NS) 0.9 % injection 3 mL, 3 mL, Intravenous, PRN, Reva Bores, MD   umeclidinium bromide (INCRUSE ELLIPTA) 62.5 MCG/ACT 1 puff, 1 puff, Inhalation, Daily, Reva Bores, MD, 1 puff at 05/07/22 1018  sodium chloride Stopped (05/07/22 1030)   dextrose 5 % and 0.45 % NaCl with KCl 20 mEq/L 125 mL/hr at 05/09/22 0122   feeding supplement (OSMOLITE 1.5 CAL)      sodium chloride, acetaminophen **OR** acetaminophen, albuterol, guaiFENesin, HYDROcodone-acetaminophen, lactulose, morphine injection, mouth rinse, mouth rinse, polyethylene glycol, sodium chloride flush   Objective    Vitals:   05/09/22 0233 05/09/22 0300 05/09/22 0357 05/09/22 0812  BP: (!) 99/48 (!) 101/49 117/61 (!) 152/54  Pulse:  78 85 83  Resp: 17 18 17 17   Temp: (!) 97 F (36.1 C)  97.7 F (36.5 C) 97.8 F (36.6 C)  TempSrc: Temporal     SpO2: 100% 100% 100% 96%  Weight:      Height:         Physical Exam Vitals and nursing note reviewed.  Constitutional:      General: He is not in acute distress.    Appearance: Normal appearance. He is ill-appearing. He is not toxic-appearing or diaphoretic.  HENT:     Head: Normocephalic and atraumatic.     Nose: Nose normal.     Comments: Ngt in place     Mouth/Throat:     Mouth: Mucous membranes are dry.     Pharynx: Oropharynx is clear.  Eyes:     General: No scleral icterus.    Extraocular Movements: Extraocular movements intact.  Cardiovascular:     Rate and Rhythm: Normal rate and regular rhythm.     Heart sounds: Normal heart sounds. No murmur heard.    No friction rub. No gallop.  Pulmonary:     Effort: Pulmonary effort is normal. No respiratory distress.     Breath sounds: Rhonchi (bilaterally) present. No wheezing or rales.  Abdominal:     General: There is no distension (greatly improved).     Palpations: Abdomen is soft.     Tenderness:  There is no abdominal tenderness. There is no guarding or rebound.     Comments: Abdomen is much softer. High pitched and hyperactive bowel sounds are present  Genitourinary:    Comments: Rectal tube in place. Suprapubic catheter in place Musculoskeletal:     Cervical back: Neck supple.     Comments: Currently with BUE mittens  Skin:    General: Skin is warm and dry.     Coloration: Skin is not jaundiced or pale.  Neurological:     Mental Status: He is alert.     Comments: Poor insight      Laboratory Data Recent Labs  Lab 05/02/22 2121 05/03/22 0406 05/07/22 0821 05/08/22 0434 05/09/22 0012  WBC 8.1   < > 8.7 8.5 8.8  HGB 13.7   < > 11.3* 11.4* 11.6*  HCT 43.1   < > 35.1* 35.7* 37.1*  PLT 273   < > 225 271 272  NEUTOPHILPCT 78  --   --   --  74  LYMPHOPCT 18  --   --   --  19  MONOPCT 4  --   --   --  6  EOSPCT 0  --   --   --  1   < > = values in this interval not displayed.   Recent Labs  Lab 05/02/22 2121 05/03/22 0406 05/07/22 0821 05/07/22 1747 05/08/22 0434 05/08/22 1740 05/09/22 0012  NA 144   < > 142  --  145  --  147*  K 2.9*   < > 2.9*   < > 2.8* 3.4* 3.2*  3.2*  CL 107   < > 117*  --  118*  --  121*  CO2 27   < > 21*  --  17*  --  18*  BUN 14   < > 54*  --  52*  --  50*  CREATININE 1.19   < > 2.07*  --  1.79*  --  1.85*  CALCIUM 8.7*   < > 8.4*   --  8.7*  --  8.9  PROT 8.1  --   --   --   --   --  6.7  BILITOT 0.9  --   --   --   --   --  0.9  ALKPHOS 100  --   --   --   --   --  64  ALT 10  --   --   --   --   --  9  AST 16  --   --   --   --   --  10*  GLUCOSE 143*   < > 108*  --  110*  --  107*   < > = values in this interval not displayed.   Recent Labs  Lab 05/02/22 2121 05/09/22 0012  INR 1.2 1.3*      Imaging Studies: DG Abd 1 View  Result Date: 05/09/2022 CLINICAL DATA:  Follow-up sigmoid volvulus. EXAM: ABDOMEN - 1 VIEW COMPARISON:  CT, 05/08/2022.  Radiographs, 05/09/2022 at 3:12 a.m. FINDINGS: Gas-filled loops of small bowel and colon. There is colonic dilation that is decreased from the prior radiographs and more significantly decreased when compared to the CT from 05/08/2022. Rectal/colonic tube tip projects in the right mid to upper abdomen. Stable suprapubic catheter. IMPRESSION: 1. Interval improvement with a decrease in colonic dilation compared to the exam earlier today, and more significantly improved compared to the previous day's CT.  Electronically Signed   By: Lajean Manes M.D.   On: 05/09/2022 09:34   DG Abd 1 View  Result Date: 05/09/2022 CLINICAL DATA:  Status post decompression of sigmoid volvulus EXAM: ABDOMEN - 1 VIEW COMPARISON:  CT abdomen and pelvis 05/08/2022 FINDINGS: Enteric tube tip in the stomach with side port not well visualized but likely near the gastroesophageal junction. Redemonstrated marked gaseous distention of the colon, similar to slightly decreased from previous CT. Foley catheter in the bladder. IMPRESSION: Similar to slight decrease in gaseous distention of the colon compared to prior CT. Electronically Signed   By: Placido Sou M.D.   On: 05/09/2022 03:52   CT ABDOMEN PELVIS WO CONTRAST  Result Date: 05/08/2022 CLINICAL DATA:  Suspected bowel obstruction. EXAM: CT ABDOMEN AND PELVIS WITHOUT CONTRAST TECHNIQUE: Multidetector CT imaging of the abdomen and pelvis was performed  following the standard protocol without IV contrast. RADIATION DOSE REDUCTION: This exam was performed according to the departmental dose-optimization program which includes automated exposure control, adjustment of the mA and/or kV according to patient size and/or use of iterative reconstruction technique. COMPARISON:  March 25, 2022 FINDINGS: Lower chest: Marked severity consolidation is seen within the posterior aspect of the right lung base. Mild posterolateral left basilar atelectasis is noted. Hepatobiliary: No focal liver abnormality is seen. The gallbladder is contracted without evidence of gallstones, gallbladder wall thickening, or biliary dilatation. Pancreas: Unremarkable. No pancreatic ductal dilatation or surrounding inflammatory changes. Spleen: Normal in size without focal abnormality. Adrenals/Urinary Tract: Adrenal glands are unremarkable. Kidneys are normal in size with mild bilateral renal cortical thinning noted. Multiple cysts are seen within the right kidney. The largest measures approximately 6.9 cm x 4.9 cm. Numerous bilateral renal and proximal ureteral calculi of various sizes are seen. The largest on the right measures approximately 1.3 cm, while the largest on the left measures 1.0 cm. A suprapubic catheter is seen within an empty urinary bladder. Stomach/Bowel: A nasogastric tube is seen with its distal tip noted within the body of the stomach. Numerous markedly dilated large bowel loops are seen throughout the abdomen and pelvis. A transition zone is seen within the mid sigmoid colon. Twisting of the mesentery is noted within this region (axial CT images 78 through 87, CT series 2). The small bowel loops are predominantly decompressed. The appendix is normal in appearance. Vascular/Lymphatic: Aortic atherosclerosis. No enlarged abdominal or pelvic lymph nodes. Reproductive: The prostate gland is not identified. Other: There is moderate severity anasarca involving predominantly the  posterolateral aspects of the abdominal and pelvic walls. No abdominopelvic ascites. Musculoskeletal: Marked severity multilevel degenerative changes seen throughout the lumbar spine. IMPRESSION: 1. 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 2. Marked severity right lower lobe consolidation, consistent with pneumonia. 3. Numerous bilateral renal and proximal ureteral calculi of various sizes. 4. Multiple right renal cysts (Bosniak 2). No additional follow-up or imaging is recommended. This recommendation follows ACR consensus guidelines: Management of the Incidental Renal Mass on CT: A White Paper of the ACR Incidental Findings Committee. J Am Coll Radiol [phone]. 5. Moderate severity anasarca. 6. Aortic atherosclerosis. Electronically Signed   By: Virgina Norfolk M.D.   On: 05/08/2022 23:25   DG Abd 1 View  Result Date: 05/08/2022 CLINICAL DATA:  [id number] NG tube placement EXAM: ABDOMEN - 1 VIEW COMPARISON:  May 08, 2022 FINDINGS: Incomplete visualization of the abdomen laterally and the pelvis. Carina is not visualized, limiting evaluation. Enteric tube tip  projects over the expected region of the proximal stomach. Side port projects over the region of the distal esophagus. Marked gaseous dilation of loops of bowel, similar in comparison to prior. IMPRESSION: 1. Enteric tube side port projects over the distal esophagus. 2. Similar appearance of diffuse gaseous dilation of loops of bowel, possibly ileus. If concern for obstruction, recommend CT. Electronically Signed   By: Meda Klinefelter M.D.   On: 05/08/2022 12:08   DG Abd 1 View  Result Date: 05/08/2022 CLINICAL DATA:  Check gastric catheter placement EXAM: ABDOMEN - 1 VIEW COMPARISON:  Film from the previous day. FINDINGS: Gastric catheter is again noted in the stomach. Scattered large and small bowel gas is again seen and stable. IMPRESSION: Gastric catheter  in the stomach. Electronically Signed   By: Alcide Clever M.D.   On: 05/08/2022 00:32   DG Abd 1 View  Result Date: 05/07/2022 CLINICAL DATA:  Check gastric catheter placement EXAM: ABDOMEN - 1 VIEW COMPARISON:  None Available. FINDINGS: Gastric catheter is noted within the stomach. Scattered large and small bowel gas is again identified. This likely represents an ileus. Correlate with clinical exam. Degenerative changes of lumbar spine are noted. IMPRESSION: Gastric catheter in place. Considerable gaseous distension of the bowel which may represent an ileus. Electronically Signed   By: Alcide Clever M.D.   On: 05/07/2022 19:32    Assessment:   # Sigmoid Volvulus- s/p decompressive colonoscopy with rectal tube placement on 10/1 - worsening abdominal distention and no bowel movement over the last several days - GI consulted at 2358 on 05/08/22 - Surgery also vollowing - volvulus demonstrated on CT on 9/30 CT at 23:25   # PE was on heparin- stopped per pharmacy at 1000 on 05/07/22   # HFpEF  Plan:  Pt's abdomen remains soft, but high pitched and hyperactive bowel sounds present KUB repeated and improved distention of the colon, but tube now projects towards the right raising concern for recurrence of volvulus Continue to monitor for volvulus with serial abdominal exams and daily KUB. Monitor for further distention If distention occurs again, recommend repeat CT  Surgery would be definitive treatment as decompressive colonoscopy only providing additional time  Eliquis has been reordered by primary team Pt with poor insight into medical condition. Agree with surgery that ethics committee should be involved  GI available as needed  I personally performed the service.  Management of other medical comorbidities as per primary team  Thank you for allowing [place] to participate in this patient's care. Please don't hesitate to call if any questions or concerns arise.   [name] Collins,  DO [facility]  Portions of the record may have been created with voice recognition software. Occasional wrong-word or 'sound-a-like' substitutions may have occurred due to the inherent limitations of voice recognition software.  Read the chart carefully and recognize, using context, where substitutions may have occurred.

## 2022-05-10 ENCOUNTER — Inpatient Hospital Stay: Payer: Medicare Other

## 2022-05-10 ENCOUNTER — Encounter: Payer: Self-pay | Admitting: Gastroenterology

## 2022-05-10 DIAGNOSIS — Z1330 Encounter for screening examination for mental health and behavioral disorders, unspecified: Secondary | ICD-10-CM

## 2022-05-10 DIAGNOSIS — Z515 Encounter for palliative care: Secondary | ICD-10-CM | POA: Diagnosis not present

## 2022-05-10 DIAGNOSIS — I2699 Other pulmonary embolism without acute cor pulmonale: Secondary | ICD-10-CM | POA: Diagnosis not present

## 2022-05-10 DIAGNOSIS — Z7189 Other specified counseling: Secondary | ICD-10-CM | POA: Diagnosis not present

## 2022-05-10 LAB — GLUCOSE, CAPILLARY
Glucose-Capillary: 111 mg/dL — ABNORMAL HIGH (ref 70–99)
Glucose-Capillary: 118 mg/dL — ABNORMAL HIGH (ref 70–99)
Glucose-Capillary: 126 mg/dL — ABNORMAL HIGH (ref 70–99)
Glucose-Capillary: 126 mg/dL — ABNORMAL HIGH (ref 70–99)
Glucose-Capillary: 126 mg/dL — ABNORMAL HIGH (ref 70–99)
Glucose-Capillary: 146 mg/dL — ABNORMAL HIGH (ref 70–99)
Glucose-Capillary: 95 mg/dL (ref 70–99)

## 2022-05-10 LAB — BASIC METABOLIC PANEL
Anion gap: 4 — ABNORMAL LOW (ref 5–15)
Anion gap: 4 — ABNORMAL LOW (ref 5–15)
BUN: 29 mg/dL — ABNORMAL HIGH (ref 8–23)
BUN: 23 mg/dL (ref 8–23)
CO2: 20 mmol/L — ABNORMAL LOW (ref 22–32)
CO2: 21 mmol/L — ABNORMAL LOW (ref 22–32)
Calcium: 8.3 mg/dL — ABNORMAL LOW (ref 8.9–10.3)
Calcium: 8.4 mg/dL — ABNORMAL LOW (ref 8.9–10.3)
Chloride: 125 mmol/L — ABNORMAL HIGH (ref 98–111)
Chloride: 125 mmol/L — ABNORMAL HIGH (ref 98–111)
Creatinine, Ser: 1.23 mg/dL (ref 0.61–1.24)
Creatinine, Ser: 1.11 mg/dL (ref 0.61–1.24)
GFR, Estimated: 60 mL/min — ABNORMAL LOW (ref >60)
GFR, Estimated: >60 mL/min (ref >60)
Glucose, Bld: 135 mg/dL — ABNORMAL HIGH (ref 70–99)
Glucose, Bld: 118 mg/dL — ABNORMAL HIGH (ref 70–99)
Potassium: 2.8 mmol/L — ABNORMAL LOW (ref 3.5–5.1)
Potassium: 2.9 mmol/L — ABNORMAL LOW (ref 3.5–5.1)
Sodium: 149 mmol/L — ABNORMAL HIGH (ref 135–145)
Sodium: 150 mmol/L — ABNORMAL HIGH (ref 135–145)

## 2022-05-10 LAB — MAGNESIUM
Magnesium: 2.1 mg/dL (ref 1.7–2.4)
Magnesium: 2.1 mg/dL (ref 1.7–2.4)

## 2022-05-10 LAB — PHOSPHORUS
Phosphorus: 2.8 mg/dL (ref 2.5–4.6)
Phosphorus: 3.1 mg/dL (ref 2.5–4.6)

## 2022-05-10 LAB — APTT: aPTT: 73 seconds — ABNORMAL HIGH (ref 24–36)

## 2022-05-10 LAB — HEPARIN LEVEL (UNFRACTIONATED): Heparin Unfractionated: >1.1 IU/mL — ABNORMAL HIGH (ref 0.30–0.70)

## 2022-05-10 MED ORDER — HEPARIN (PORCINE) 25000 UT/250ML-% IV SOLN
1350.0000 [IU]/h | INTRAVENOUS | Status: DC
  Administered 2022-05-10: 1500 [IU]/h via INTRAVENOUS
  Administered 2022-05-11: 1350 [IU]/h via INTRAVENOUS
  Filled 2022-05-10 (×2): qty 250

## 2022-05-10 MED ORDER — POTASSIUM CHLORIDE 10 MEQ/100ML IV SOLN
10.0000 meq | INTRAVENOUS | Status: AC
  Administered 2022-05-10 (×4): 10 meq via INTRAVENOUS
  Filled 2022-05-10 (×4): qty 100

## 2022-05-10 MED ORDER — POTASSIUM CHLORIDE 20 MEQ PO PACK
40.0000 meq | PACK | Freq: Two times a day (BID) | ORAL | Status: AC
  Administered 2022-05-10 (×2): 40 meq
  Filled 2022-05-10 (×2): qty 2

## 2022-05-10 MED ORDER — POTASSIUM CHLORIDE 20 MEQ PO PACK
40.0000 meq | PACK | Freq: Once | ORAL | Status: AC
  Administered 2022-05-10: 40 meq
  Filled 2022-05-10: qty 2

## 2022-05-10 NOTE — Consult Note (Signed)
PHARMACY CONSULT NOTE - FOLLOW UP  Pharmacy Consult for Electrolyte Monitoring and Replacement   Recent Labs: Potassium (mmol/L)  Date Value  05/10/2022 2.8 (L)  10/03/2012 3.1 (L)   Magnesium (mg/dL)  Date Value  05/10/2022 2.1  10/02/2012 1.3 (L)   Calcium (mg/dL)  Date Value  05/10/2022 8.3 (L)   Calcium, Total (mg/dL)  Date Value  10/03/2012 8.2 (L)   Albumin (g/dL)  Date Value  05/09/2022 2.7 (L)  10/02/2012 3.4   Phosphorus (mg/dL)  Date Value  05/10/2022 2.8   Sodium (mmol/L)  Date Value  05/10/2022 149 (H)  10/03/2012 141    Assessment: 81 y.o. male with medical history significant of HTN, HLD, GERD, COPD, CHF (diastolic), seizure disorder, BPH who comes in today with presumed COPD exacerbation. Found to have a PE, started on heparin infusion. Potassium continues to be low  Goal of Therapy:  WNL  Plan: UOP 0.2>0.59ml/k/h; Scr 2.07>1.85>1.23 K 3.2>2.8. Despite KCL 10 mEq IV x 2; KCL 18meq PO x1 Will increase repletion to 51meq PO  x1 and 68meq IV q1h x4 Recheck a BMP in the afternoon 10/2 for additional repletion needs. CTM daily with AM labs.  Lorna Dibble Clinical Pharmacist 05/10/2022 7:27 AM

## 2022-05-10 NOTE — Progress Notes (Addendum)
Inpatient Follow-up/Progress Note   Patient ID: [name] is a 81 y.o. male.  Overnight Events / Subjective Findings Pt continues to have output through and around rectal tube. Abdomen remains soft. He notes some discomfort today. Imaging worrisome for recurrence of torsion.  Tube adjusted with nursing at bedside to address "kink" in tube that was external to the body.  Awaiting psych eval for capacity evaluation.  Review of Systems  Gastrointestinal:  Negative for abdominal distention (improved), abdominal pain, blood in stool, nausea and vomiting.     Medications  Current Facility-Administered Medications:    0.9 %  sodium chloride infusion, 250 mL, Intravenous, PRN, Donnamae Jude, MD, Stopped at 05/07/22 1030   acetaminophen (TYLENOL) tablet 650 mg, 650 mg, Per Tube, Q6H PRN **OR** acetaminophen (TYLENOL) suppository 650 mg, 650 mg, Rectal, Q6H PRN, Shawna Clamp, MD   albuterol (PROVENTIL) (2.5 MG/3ML) 0.083% nebulizer solution 2.5 mg, 2.5 mg, Nebulization, Q2H PRN, Donnamae Jude, MD   ARIPiprazole (ABILIFY) tablet 20 mg, 20 mg, Per Tube, Daily, Shawna Clamp, MD, 20 mg at 05/10/22 6767   ascorbic acid (VITAMIN C) tablet 500 mg, 500 mg, Per Tube, BID, Shawna Clamp, MD, 500 mg at 05/10/22 0846   buPROPion [facility]) tablet 150 mg, 150 mg, Per Tube, BID, Shawna Clamp, MD, 150 mg at 05/10/22 0829   carvedilol (COREG) tablet 25 mg, 25 mg, Per Tube, BID, Shawna Clamp, MD, 25 mg at 05/10/22 2094   Chlorhexidine Gluconate Cloth 2 % PADS 6 each, 6 each, Topical, Daily, Fritzi Mandes, MD, 6 each at 05/10/22 1324   cholecalciferol (VITAMIN D3) 25 MCG (1000 UNIT) tablet 1,000 Units, 1,000 Units, Per Tube, q AM, Shawna Clamp, MD, 1,000 Units at 05/10/22 0845   dextromethorphan (DELSYM) 30 MG/5ML liquid 30 mg, 5 mL, Per Tube, Q12H, Shawna Clamp, MD, 30 mg at 05/10/22 0836   dextrose 5 % and 0.45 % NaCl with KCl 20 mEq/L infusion, , Intravenous, Continuous, Sharion Settler, NP, Last Rate: 125 mL/hr at 05/10/22 1142, New Bag at 05/10/22 1142   ferrous sulfate 220 (44 Fe) MG/5ML solution 325 mg, 325 mg, Per Tube, Q breakfast, Shawna Clamp, MD, 325 mg at 05/10/22 0842   finasteride (PROSCAR) tablet 5 mg, 5 mg, Oral, Daily, Shawna Clamp, MD, 5 mg at 05/10/22 0843   fluticasone furoate-vilanterol (BREO ELLIPTA) 100-25 MCG/ACT 1 puff, 1 puff, Inhalation, Daily, Donnamae Jude, MD, 1 puff at 70/96/28 3662   folic acid (FOLVITE) tablet 1 mg, 1 mg, Per Tube, q AM, Shawna Clamp, MD, 1 mg at 05/10/22 0846   guaiFENesin (ROBITUSSIN) 100 MG/5ML liquid 5 mL, 5 mL, Per Tube, Q4H PRN, Shawna Clamp, MD   heparin ADULT infusion 100 units/mL (25000 units/227mL), 1,500 Units/hr, Intravenous, Continuous, Delena Bali, [facility], Last Rate: 15 mL/hr at 05/10/22 1211, 1,500 Units/hr at 05/10/22 1211   HYDROcodone-acetaminophen (NORCO/VICODIN) 5-325 MG per tablet 1-2 tablet, 1-2 tablet, Per Tube, Q4H PRN, Shawna Clamp, MD, 2 tablet at 05/08/22 9476   insulin aspart (novoLOG) injection 0-15 Units, 0-15 Units, Subcutaneous, TID WC, Donnamae Jude, MD, 2 Units at 05/06/22 1616   ipratropium-albuterol (DUONEB) 0.5-2.5 (3) MG/3ML nebulizer solution 3 mL, 3 mL, Nebulization, Q6H, Donnamae Jude, MD, 3 mL at 05/10/22 1415   lactulose (CHRONULAC) 10 GM/15ML solution 10 g, 10 g, Per Tube, Q6H PRN, Shawna Clamp, MD, 10 g at 05/08/22 1311   loratadine (CLARITIN) tablet 10 mg, 10 mg, Per Tube, Daily, Shawna Clamp, MD, 10 mg at  05/10/22 0846   morphine (PF) 2 MG/ML injection 1 mg, 1 mg, Intravenous, Q6H PRN, Cipriano Bunker, MD, 1 mg at 05/08/22 2039   multivitamin with minerals tablet 1 tablet, 1 tablet, Per Tube, Daily, Cipriano Bunker, MD, 1 tablet at 05/10/22 9509   Oral care mouth rinse, 15 mL, Mouth Rinse, PRN, Enedina Finner, MD   Oral care mouth rinse, 15 mL, Mouth Rinse, 4 times per day, Enedina Finner, MD, 15 mL at 05/10/22 1323   Oral care mouth rinse, 15 mL, Mouth Rinse, PRN,  Enedina Finner, MD   PHENObarbital (LUMINAL) injection 65 mg, 65 mg, Intravenous, Connye Burkitt, NP, 65 mg at 05/10/22 0846   polyethylene glycol (MIRALAX / GLYCOLAX) packet 17 g, 17 g, Per Tube, Daily PRN, Cipriano Bunker, MD   pravastatin (PRAVACHOL) tablet 10 mg, 10 mg, Per Tube, QHS, Cipriano Bunker, MD, 10 mg at 05/09/22 2148   sodium chloride flush (NS) 0.9 % injection 3 mL, 3 mL, Intravenous, Q12H, Reva Bores, MD, 3 mL at 05/10/22 0849   sodium chloride flush (NS) 0.9 % injection 3 mL, 3 mL, Intravenous, PRN, Reva Bores, MD   umeclidinium bromide (INCRUSE ELLIPTA) 62.5 MCG/ACT 1 puff, 1 puff, Inhalation, Daily, Reva Bores, MD, 1 puff at 05/10/22 0847  sodium chloride Stopped (05/07/22 1030)   dextrose 5 % and 0.45 % NaCl with KCl 20 mEq/L 125 mL/hr at 05/10/22 1142   heparin 1,500 Units/hr (05/10/22 1211)    sodium chloride, acetaminophen **OR** acetaminophen, albuterol, guaiFENesin, HYDROcodone-acetaminophen, lactulose, morphine injection, mouth rinse, mouth rinse, polyethylene glycol, sodium chloride flush   Objective    Vitals:   05/10/22 0611 05/10/22 0804 05/10/22 1159 05/10/22 1419  BP:   (!) 140/76   Pulse:   71   Resp:   20   Temp:   (!) 97.4 F (36.3 C)   TempSrc:   Oral   SpO2:  98% 100% 98%  Weight: 99.3 kg     Height: 5\' 7"  (1.702 m)        Physical Exam Vitals and nursing note reviewed.  Constitutional:      General: He is not in acute distress.    Appearance: Normal appearance. He is ill-appearing. He is not toxic-appearing or diaphoretic.  HENT:     Head: Normocephalic and atraumatic.     Nose: Nose normal.     Comments: Ngt in place    Mouth/Throat:     Mouth: Mucous membranes are dry.     Pharynx: Oropharynx is clear.  Eyes:     General: No scleral icterus.    Extraocular Movements: Extraocular movements intact.  Cardiovascular:     Rate and Rhythm: Normal rate and regular rhythm.     Heart sounds: Normal heart sounds. No murmur  heard.    No friction rub. No gallop.  Pulmonary:     Effort: Pulmonary effort is normal. No respiratory distress.     Breath sounds: Rhonchi (bilaterally) present. No wheezing or rales.  Abdominal:     General: There is no distension (greatly improved).     Palpations: Abdomen is soft.     Tenderness: There is no abdominal tenderness. There is no guarding or rebound.     Comments: Abdomen is much softer. High pitched and hyperactive bowel sounds are present  Genitourinary:    Comments: Rectal tube in place. Suprapubic catheter in place Musculoskeletal:     Cervical back: Neck supple.  Skin:    General: Skin is warm  and dry.     Coloration: Skin is not jaundiced or pale.  Neurological:     Mental Status: He is alert.     Comments: Poor insight      Laboratory Data Recent Labs  Lab 05/07/22 0821 05/08/22 0434 05/09/22 0012  WBC 8.7 8.5 8.8  HGB 11.3* 11.4* 11.6*  HCT 35.1* 35.7* 37.1*  PLT 225 271 272  NEUTOPHILPCT  --   --  74  LYMPHOPCT  --   --  19  MONOPCT  --   --  6  EOSPCT  --   --  1    Recent Labs  Lab 05/08/22 0434 05/08/22 1740 05/09/22 0012 05/10/22 0453  NA 145  --  147* 149*  K 2.8* 3.4* 3.2*  3.2* 2.8*  CL 118*  --  121* 125*  CO2 17*  --  18* 20*  BUN 52*  --  50* 29*  CREATININE 1.79*  --  1.85* 1.23  CALCIUM 8.7*  --  8.9 8.3*  PROT  --   --  6.7  --   BILITOT  --   --  0.9  --   ALKPHOS  --   --  64  --   ALT  --   --  9  --   AST  --   --  10*  --   GLUCOSE 110*  --  107* 135*    Recent Labs  Lab 05/09/22 0012  INR 1.3*       Imaging Studies: DG Abd 1 View  Result Date: 05/10/2022 CLINICAL DATA:  Sigmoid volvulus of the colon. EXAM: ABDOMEN - 1 VIEW COMPARISON:  CT scan May 08, 2022.  KUBs May 09, 2022 FINDINGS: A rectal tube terminates in the right upper quadrant, stable in position. There is increasing colonic dilatation measuring up to 12 cm in caliber. The dilatation has increased significantly since the most  recent KUB. There is a sharp turn in the rectal tube as it exits the patient. No small bowel dilatation. Bilateral renal stones identified. An NG tube terminates in the stomach. No other abnormalities. IMPRESSION: 1. A rectal tube terminates in the right upper quadrant. There is a sharp turn/possible kink in the rectal tube is at exits the patient. There is significant increased colonic dilatation since the most recent KUB measuring up to 12 cm in caliber. 2. No other interval changes. These results will be called to the ordering clinician or representative by the Radiologist Assistant, and communication documented in the PACS or [employer]. Electronically Signed   By: Gerome Samavid  Williams III M.D.   On: 05/10/2022 08:10   DG Abd 1 View  Result Date: 05/09/2022 CLINICAL DATA:  Follow-up sigmoid volvulus. EXAM: ABDOMEN - 1 VIEW COMPARISON:  CT, 05/08/2022.  Radiographs, 05/09/2022 at 3:12 a.m. FINDINGS: Gas-filled loops of small bowel and colon. There is colonic dilation that is decreased from the prior radiographs and more significantly decreased when compared to the CT from 05/08/2022. Rectal/colonic tube tip projects in the right mid to upper abdomen. Stable suprapubic catheter. IMPRESSION: 1. Interval improvement with a decrease in colonic dilation compared to the exam earlier today, and more significantly improved compared to the previous day's CT. Electronically Signed   By: Amie Portlandavid  Ormond M.D.   On: 05/09/2022 09:34   DG Abd 1 View  Result Date: 05/09/2022 CLINICAL DATA:  Status post decompression of sigmoid volvulus EXAM: ABDOMEN - 1 VIEW COMPARISON:  CT abdomen and pelvis 05/08/2022 FINDINGS: Enteric  tube tip in the stomach with side port not well visualized but likely near the gastroesophageal junction. Redemonstrated marked gaseous distention of the colon, similar to slightly decreased from previous CT. Foley catheter in the bladder. IMPRESSION: Similar to slight decrease in gaseous distention of  the colon compared to prior CT. Electronically Signed   By: Minerva Fester M.D.   On: 05/09/2022 03:52   CT ABDOMEN PELVIS WO CONTRAST  Result Date: 05/08/2022 CLINICAL DATA:  Suspected bowel obstruction. EXAM: CT ABDOMEN AND PELVIS WITHOUT CONTRAST TECHNIQUE: Multidetector CT imaging of the abdomen and pelvis was performed following the standard protocol without IV contrast. RADIATION DOSE REDUCTION: This exam was performed according to the departmental dose-optimization program which includes automated exposure control, adjustment of the mA and/or kV according to patient size and/or use of iterative reconstruction technique. COMPARISON:  March 25, 2022 FINDINGS: Lower chest: Marked severity consolidation is seen within the posterior aspect of the right lung base. Mild posterolateral left basilar atelectasis is noted. Hepatobiliary: No focal liver abnormality is seen. The gallbladder is contracted without evidence of gallstones, gallbladder wall thickening, or biliary dilatation. Pancreas: Unremarkable. No pancreatic ductal dilatation or surrounding inflammatory changes. Spleen: Normal in size without focal abnormality. Adrenals/Urinary Tract: Adrenal glands are unremarkable. Kidneys are normal in size with mild bilateral renal cortical thinning noted. Multiple cysts are seen within the right kidney. The largest measures approximately 6.9 cm x 4.9 cm. Numerous bilateral renal and proximal ureteral calculi of various sizes are seen. The largest on the right measures approximately 1.3 cm, while the largest on the left measures 1.0 cm. A suprapubic catheter is seen within an empty urinary bladder. Stomach/Bowel: A nasogastric tube is seen with its distal tip noted within the body of the stomach. Numerous markedly dilated large bowel loops are seen throughout the abdomen and pelvis. A transition zone is seen within the mid sigmoid colon. Twisting of the mesentery is noted within this region (axial CT images 78  through 87, CT series 2). The small bowel loops are predominantly decompressed. The appendix is normal in appearance. Vascular/Lymphatic: Aortic atherosclerosis. No enlarged abdominal or pelvic lymph nodes. Reproductive: The prostate gland is not identified. Other: There is moderate severity anasarca involving predominantly the posterolateral aspects of the abdominal and pelvic walls. No abdominopelvic ascites. Musculoskeletal: Marked severity multilevel degenerative changes seen throughout the lumbar spine. IMPRESSION: 1. 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 2. Marked severity right lower lobe consolidation, consistent with pneumonia. 3. Numerous bilateral renal and proximal ureteral calculi of various sizes. 4. Multiple right renal cysts (Bosniak 2). No additional follow-up or imaging is recommended. This recommendation follows ACR consensus guidelines: Management of the Incidental Renal Mass on CT: A White Paper of the ACR Incidental Findings Committee. J Am Coll Radiol [phone]. 5. Moderate severity anasarca. 6. Aortic atherosclerosis. Electronically Signed   By: Aram Candela M.D.   On: 05/08/2022 23:25    Assessment:   # Sigmoid Volvulus- s/p decompressive colonoscopy with rectal tube placement on 10/1 - worsening abdominal distention and no bowel movement over the last several days - GI consulted at 2358 on 05/08/22 - Surgery also vollowing - volvulus demonstrated on CT on 9/30 CT at 23:25   # PE was on heparin- stopped per pharmacy at 1000 on 05/07/22   # HFpEF  Plan:  Surgery following. Awaiting assessment of capacity for possible definitive intervention Abdomen is still soft and pt still putting out stool  Adjusted/modified rectal tube today Continue to monitor for volvulus with serial abdominal exams and daily KUB. Monitor for further distention No repeat endoscopy at this time, unless becomes  urgent/emergent with worsening abdominal distention Surgery would be definitive treatment as decompressive colonoscopy only providing additional time  Pt on anticoagulation per primary team Pt with poor insight into medical condition.  Ethics committee following Palliative care following Psychiatry has been consulted No further GI intervention at this time  GI to sign off. Available as needed  I personally performed the service.  Management of other medical comorbidities as per primary team  Thank you for allowing [place] to participate in this patient's care. Please don't hesitate to call if any questions or concerns arise.   Jaynie Collins, DO [facility]  Portions of the record may have been created with voice recognition software. Occasional wrong-word or 'sound-a-like' substitutions may have occurred due to the inherent limitations of voice recognition software.  Read the chart carefully and recognize, using context, where substitutions may have occurred.

## 2022-05-10 NOTE — Progress Notes (Signed)
PROGRESS NOTE    [name]  CZY:[id number] DOB: August 02, 1941 DOA: 05/02/2022 PCP: Rica Koyanagi, MD   Brief Narrative:  This 81 y.o. male with medical history significant of HTN, HLD, GERD, COPD, CHF (diastolic), seizure disorder, BPH who comes in ED with presumed COPD exacerbation.  Per the EDP he was wheezing on admission and hypoxic.  EMS had to put him on CPAP to get his oxygen saturations up.  His blood gases were essentially normal.  He was weaned down to nasal cannula.  His chest x-ray was negative.  COVID was also negative. CT chest with contrast Filling defects within right lower lobe pulmonary arterial branches compatible with pulmonary emboli. Associated right basilar airspace opacity could reflect atelectasis or infarcts.Elevation of the right hemidiaphragm.  Patient is admitted for acute pulmonary embolism, started on IV heparin.  Patient has significant risk of aspiration.  Speech and swallow recommended n.p.o. with alternate means of feeding.  Palliative care consulted to discuss goals of care.  Patient has no capacity to make decision.  No alternate decision-maker has been identified at this time.  Patient has developed sigmoid volvulus overnight.  General surgery and GI consulted.  Patient underwent emergent colonoscopy with decompression of the volvulus.  Definitive treatment would be surgical correction.  Patient is at high risk for volvulus recurrence.  Decompression provides time but not a definite treatment.  Ethics committee consulted to make a determination since patient has no next of kin.  Assessment & Plan:   Principal Problem:   Acute pulmonary embolism (HCC) Active Problems:   UTI (urinary tract infection)   Chronic diastolic CHF (congestive heart failure) (HCC)   Seizure disorder (HCC)   Depression   Hypertension   Mixed hyperlipidemia   BPH (benign prostatic hyperplasia)   Normocytic anemia   Hypokalemia   DVT of lower extremity, bilateral (HCC)   Venous  insufficiency of both lower extremities   COPD exacerbation (HCC)   Malnutrition of moderate degree   Acute deep vein thrombosis (DVT) of left lower extremity (HCC)   Acute respiratory failure with hypoxia (HCC)  Acute pulmonary embolism / Acute Right LE DVT:  Patient found to have acute pulmonary embolism and acute Right LE DVT He was started on heparin gtt. Likely primary cause for his respiratory distress and oxygen requirements. Continue supplemental oxygen as needed. He was successfully transitioned to Eliquis, placed back on heparin IV due to n.p.o. and high aspiration risk.  UTI in the setting of chronic suprapubic catheter: Patient has indwelling suprapubic catheter and urine had a lot of sediment in it. Suprapubic catheter was changed  (05/03/22) It was changed on August 25th 2023 at urology office Remains afebrile without leukocytosis.    Aspiration pneumonia: Completed antibiotics for aspiration pneumonia. Chest x-ray shows improving infiltrate. Continue to monitor clinical status. NG tube inserted for medications and for feeding.  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  General surgery and GI consulted.   Patient underwent emergent colonoscopy with decompression of the volvulus.   Definitive treatment would be surgical correction.  Patient is at high risk for volvulus recurrence.   Decompression provides time but not a definite treatment.   Ethics committee consulted to make a determination since patient is no next of kin. General surgery recommended surgical intervention. Psych consulted for capacity evaluation.  Chronic diastolic CHF: Lisinopril carvedilol and Lasix on hold because of low blood pressure. BNP is slightly elevated but chest x-ray does not show fluid overload.   Seizure disorder (  HCC) Continue phenobarbital.   Depression Continue Wellbutrin, Abilify.  Hypokalemia: Replacement in progress.  Continue to monitor    Hypertension Hold lisinopril, carvedilol because of low blood pressure.   Mixed hyperlipidemia Continue Pravachol.   BPH (benign prostatic hyperplasia) Continue Proscar.   Normocytic anemia Hemoglobin is stable.   COPD: Currently no wheezing. Continue inhalers.   Venous insufficiency of both lower extremities Continue Lasix as needed   Dysphagia with high aspiration risk: Speech and swallow evaluation completed, high risk for aspiration recommended NPO. Patient was empirically started on ceftriaxone for aspiration pneumonia. Palliative care consulted to discuss goals of care, alternate means of feeding. Inserted NG tube for feeding and for medications  Obesity: Estimated body mass index is 34.29 kg/m as calculated from the following:   Height as of this encounter: 5\' 7"  (1.702 m).   Weight as of this encounter: 99.3 kg.      DVT prophylaxis: Eliquis Code Status: Full code Family Communication: No family at bedside Disposition Plan:   Status is: Inpatient Remains inpatient appropriate because: Admitted for acute PE, acute right LE DVT started on Eliquis.  Patient has dysphagia with high aspiration risk,  Palliative care consulted to discuss goals of care, alternate means of feeding. Patient has no capacity to make decision.  No alternate decision-maker has been identified at this time. Patient has developed sigmoid volvulus overnight.  General surgery and GI consulted.  Patient underwent emergent colonoscopy with decompression of the volvulus.  Definitive treatment would be surgical correction.  Patient is at high risk for volvulus recurrence.  Decompression provides time but not a definite treatment.  Ethics committee consulted to make a determination since patient is no next of kin.   Consultants:  Palliative care  Procedures: None Antimicrobials:  Anti-infectives (From admission, onward)    Start     Dose/Rate Route Frequency Ordered Stop   05/05/22 0215   cefTRIAXone (ROCEPHIN) 1 g in sodium chloride 0.9 % 100 mL IVPB        1 g 200 mL/hr over 30 Minutes Intravenous Every 24 hours 05/05/22 0118 05/09/22 07/09/22       Subjective: Patient was seen and examined at bedside.  Overnight events noted.   Patient is awake, alert and oriented x 3.  Following full commands. Patient has developed sigmoid volvulus underwent emergent colonoscopy with decompression. Patient had a rectal tube, continue to remain npo, explained in detail that he needs surgery to fix the problem.  He understands.  Objective: Vitals:   05/10/22 0611 05/10/22 0804 05/10/22 1159 05/10/22 1419  BP:   (!) 140/76   Pulse:   71   Resp:   20   Temp:   (!) 97.4 F (36.3 C)   TempSrc:   Oral   SpO2:  98% 100% 98%  Weight: 99.3 kg     Height: 5\' 7"  (1.702 m)       Intake/Output Summary (Last 24 hours) at 05/10/2022 1513 Last data filed at 05/10/2022 1322 Gross per 24 hour  Intake 1893.07 ml  Output 2425 ml  Net -531.93 ml   Filed Weights   05/02/22 2102 05/03/22 0842 05/10/22 0611  Weight: 102.5 kg 95 kg 99.3 kg    Examination:  General exam: Appears comfortable, NAD, deconditioned, NG tube noted. Respiratory system: Decreased breath sounds, respiratory effort normal, RR 15. Cardiovascular system: S1 & S2 heard, regular rate and rhythm, no murmur.   Gastrointestinal system: Abdomen is soft, less distended, non tender, BS+, rectal tube noted Central  nervous system: Alert and oriented X 3 . No focal neurological deficits. Extremities: No edema, no cyanosis, no clubbing. Skin: No rashes, lesions or ulcers Psychiatry: Mood & affect appropriate.     Data Reviewed: I have personally reviewed following labs and imaging studies  CBC: Recent Labs  Lab 05/05/22 0613 05/06/22 0453 05/07/22 0821 05/08/22 0434 05/09/22 0012  WBC 10.4 8.4 8.7 8.5 8.8  NEUTROABS  --   --   --   --  6.4  HGB 12.0* 12.0* 11.3* 11.4* 11.6*  HCT 37.1* 37.1* 35.1* 35.7* 37.1*  MCV 84.7  85.9 86.2 86.9 88.1  PLT 292 273 225 271 272   Basic Metabolic Panel: Recent Labs  Lab 05/06/22 0453 05/06/22 1737 05/07/22 0821 05/07/22 1747 05/08/22 0434 05/08/22 1740 05/09/22 0012 05/09/22 0526 05/10/22 0453  NA 143  --  142  --  145  --  147*  --  149*  K 2.4*   < > 2.9* 3.1* 2.8* 3.4* 3.2*  3.2*  --  2.8*  CL 113*  --  117*  --  118*  --  121*  --  125*  CO2 22  --  21*  --  17*  --  18*  --  20*  GLUCOSE 127*  --  108*  --  110*  --  107*  --  135*  BUN 49*  --  54*  --  52*  --  50*  --  29*  CREATININE 2.10*  --  2.07*  --  1.79*  --  1.85*  --  1.23  CALCIUM 8.5*  --  8.4*  --  8.7*  --  8.9  --  8.3*  MG 2.3  --  2.4  --  2.4  --   --  2.4 2.1  PHOS 3.3  --   --   --   --   --   --  3.7 2.8   < > = values in this interval not displayed.   GFR: Estimated Creatinine Clearance: 54.7 mL/min (by C-G formula based on SCr of 1.23 mg/dL). Liver Function Tests: Recent Labs  Lab 05/09/22 0012  AST 10*  ALT 9  ALKPHOS 64  BILITOT 0.9  PROT 6.7  ALBUMIN 2.7*   No results for input(s): "LIPASE", "AMYLASE" in the last 168 hours. No results for input(s): "AMMONIA" in the last 168 hours. Coagulation Profile: Recent Labs  Lab 05/09/22 0012  INR 1.3*   Cardiac Enzymes: No results for input(s): "CKTOTAL", "CKMB", "CKMBINDEX", "TROPONINI" in the last 168 hours. BNP (last 3 results) No results for input(s): "PROBNP" in the last 8760 hours. HbA1C: No results for input(s): "HGBA1C" in the last 72 hours.  CBG: Recent Labs  Lab 05/09/22 2040 05/10/22 0013 05/10/22 0422 05/10/22 0840 05/10/22 1208  GLUCAP 156* 126* 146* 126* 111*   Lipid Profile: No results for input(s): "CHOL", "HDL", "LDLCALC", "TRIG", "CHOLHDL", "LDLDIRECT" in the last 72 hours. Thyroid Function Tests: No results for input(s): "TSH", "T4TOTAL", "FREET4", "T3FREE", "THYROIDAB" in the last 72 hours.  Anemia Panel: No results for input(s): "VITAMINB12", "FOLATE", "FERRITIN", "TIBC", "IRON",  "RETICCTPCT" in the last 72 hours. Sepsis Labs: Recent Labs  Lab 05/06/22 0448 05/09/22 0012  PROCALCITON <0.10 <0.10  LATICACIDVEN  --  0.9    Recent Results (from the past 240 hour(s))  SARS Coronavirus 2 by RT PCR (hospital order, performed in [facility] hospital lab) *cepheid single result test* Anterior Nasal Swab     Status: None  Collection Time: 05/02/22  9:21 PM   Specimen: Anterior Nasal Swab  Result Value Ref Range Status   SARS Coronavirus 2 by RT PCR NEGATIVE NEGATIVE Final    Comment: (NOTE) SARS-CoV-2 target nucleic acids are NOT DETECTED.  The SARS-CoV-2 RNA is generally detectable in upper and lower respiratory specimens during the acute phase of infection. The lowest concentration of SARS-CoV-2 viral copies this assay can detect is 250 copies / mL. A negative result does not preclude SARS-CoV-2 infection and should not be used as the sole basis for treatment or other patient management decisions.  A negative result may occur with improper specimen collection / handling, submission of specimen other than nasopharyngeal swab, presence of viral mutation(s) within the areas targeted by this assay, and inadequate number of viral copies (<250 copies / mL). A negative result must be combined with clinical observations, patient history, and epidemiological information.  Fact Sheet for Patients:   RoadLapTop.co.zahttps://www.fda.gov/media/158405/download  Fact Sheet for Healthcare Providers: http://kim-miller.com/https://www.fda.gov/media/158404/download  This test is not yet approved or  cleared by the [place] FDA and has been authorized for detection and/or diagnosis of SARS-CoV-2 by FDA under an Emergency Use Authorization (EUA).  This EUA will remain in effect (meaning this test can be used) for the duration of the COVID-19 declaration under Section 564(b)(1) of the Act, 21 U.S.C. section 360bbb-3(b)(1), unless the authorization is terminated or revoked sooner.  Performed at [facility], [address] [place], [place] [address]   Respiratory (~20 pathogens) panel by PCR     Status: None   Collection Time: 05/03/22  1:18 AM   Specimen: Nasopharyngeal Swab; Respiratory  Result Value Ref Range Status   Adenovirus NOT DETECTED NOT DETECTED Final   Coronavirus 229E NOT DETECTED NOT DETECTED Final    Comment: (NOTE) The Coronavirus on the Respiratory Panel, DOES NOT test for the novel  Coronavirus (2019 nCoV)    Coronavirus HKU1 NOT DETECTED NOT DETECTED Final   Coronavirus NL63 NOT DETECTED NOT DETECTED Final   Coronavirus OC43 NOT DETECTED NOT DETECTED Final   Metapneumovirus NOT DETECTED NOT DETECTED Final   Rhinovirus / Enterovirus NOT DETECTED NOT DETECTED Final   Influenza A NOT DETECTED NOT DETECTED Final   Influenza B NOT DETECTED NOT DETECTED Final   Parainfluenza Virus 1 NOT DETECTED NOT DETECTED Final   Parainfluenza Virus 2 NOT DETECTED NOT DETECTED Final   Parainfluenza Virus 3 NOT DETECTED NOT DETECTED Final   Parainfluenza Virus 4 NOT DETECTED NOT DETECTED Final   Respiratory Syncytial Virus NOT DETECTED NOT DETECTED Final   Bordetella pertussis NOT DETECTED NOT DETECTED Final   Bordetella Parapertussis NOT DETECTED NOT DETECTED Final   Chlamydophila pneumoniae NOT DETECTED NOT DETECTED Final   Mycoplasma pneumoniae NOT DETECTED NOT DETECTED Final    Comment: Performed at [facility], [address] [place], [place] [address]  MRSA Next Gen by PCR, Nasal     Status: None   Collection Time: 05/03/22 11:24 AM   Specimen: Nasal Mucosa; Nasal Swab  Result Value Ref Range Status   MRSA by PCR Next Gen NOT DETECTED NOT DETECTED Final    Comment: (NOTE) The GeneXpert MRSA Assay (FDA approved for NASAL specimens only), is one component of a comprehensive MRSA colonization surveillance program. It is not intended to diagnose MRSA infection nor to guide or monitor treatment for MRSA infections. Test performance is not FDA approved in  patients less than 879 years old. Performed at [facility], [phone]  [address] [place], [place] [address]     Radiology Studies: DG Abd 1 View  Result Date: 05/10/2022 CLINICAL DATA:  Sigmoid volvulus of the colon. EXAM: ABDOMEN - 1 VIEW COMPARISON:  CT scan May 08, 2022.  KUBs May 09, 2022 FINDINGS: A rectal tube terminates in the right upper quadrant, stable in position. There is increasing colonic dilatation measuring up to 12 cm in caliber. The dilatation has increased significantly since the most recent KUB. There is a sharp turn in the rectal tube as it exits the patient. No small bowel dilatation. Bilateral renal stones identified. An NG tube terminates in the stomach. No other abnormalities. IMPRESSION: 1. A rectal tube terminates in the right upper quadrant. There is a sharp turn/possible kink in the rectal tube is at exits the patient. There is significant increased colonic dilatation since the most recent KUB measuring up to 12 cm in caliber. 2. No other interval changes. These results will be called to the ordering clinician or representative by the Radiologist Assistant, and communication documented in the PACS or [employer]. Electronically Signed   By: Gerome Sam III M.D.   On: 05/10/2022 08:10   DG Abd 1 View  Result Date: 05/09/2022 CLINICAL DATA:  Follow-up sigmoid volvulus. EXAM: ABDOMEN - 1 VIEW COMPARISON:  CT, 05/08/2022.  Radiographs, 05/09/2022 at 3:12 a.m. FINDINGS: Gas-filled loops of small bowel and colon. There is colonic dilation that is decreased from the prior radiographs and more significantly decreased when compared to the CT from 05/08/2022. Rectal/colonic tube tip projects in the right mid to upper abdomen. Stable suprapubic catheter. IMPRESSION: 1. Interval improvement with a decrease in colonic dilation compared to the exam earlier today, and more significantly improved compared to the previous day's CT. Electronically Signed   By: Amie Portland M.D.   On: 05/09/2022 09:34   DG Abd 1 View  Result Date: 05/09/2022 CLINICAL DATA:  Status post decompression of sigmoid volvulus EXAM: ABDOMEN - 1 VIEW COMPARISON:  CT abdomen and pelvis 05/08/2022 FINDINGS: Enteric tube tip in the stomach with side port not well visualized but likely near the gastroesophageal junction. Redemonstrated marked gaseous distention of the colon, similar to slightly decreased from previous CT. Foley catheter in the bladder. IMPRESSION: Similar to slight decrease in gaseous distention of the colon compared to prior CT. Electronically Signed   By: Minerva Fester M.D.   On: 05/09/2022 03:52   CT ABDOMEN PELVIS WO CONTRAST  Result Date: 05/08/2022 CLINICAL DATA:  Suspected bowel obstruction. EXAM: CT ABDOMEN AND PELVIS WITHOUT CONTRAST TECHNIQUE: Multidetector CT imaging of the abdomen and pelvis was performed following the standard protocol without IV contrast. RADIATION DOSE REDUCTION: This exam was performed according to the departmental dose-optimization program which includes automated exposure control, adjustment of the mA and/or kV according to patient size and/or use of iterative reconstruction technique. COMPARISON:  March 25, 2022 FINDINGS: Lower chest: Marked severity consolidation is seen within the posterior aspect of the right lung base. Mild posterolateral left basilar atelectasis is noted. Hepatobiliary: No focal liver abnormality is seen. The gallbladder is contracted without evidence of gallstones, gallbladder wall thickening, or biliary dilatation. Pancreas: Unremarkable. No pancreatic ductal dilatation or surrounding inflammatory changes. Spleen: Normal in size without focal abnormality. Adrenals/Urinary Tract: Adrenal glands are unremarkable. Kidneys are normal in size with mild bilateral renal cortical thinning noted. Multiple cysts are seen within the right kidney. The largest measures approximately 6.9 cm x 4.9 cm. Numerous bilateral renal and  proximal ureteral calculi  of various sizes are seen. The largest on the right measures approximately 1.3 cm, while the largest on the left measures 1.0 cm. A suprapubic catheter is seen within an empty urinary bladder. Stomach/Bowel: A nasogastric tube is seen with its distal tip noted within the body of the stomach. Numerous markedly dilated large bowel loops are seen throughout the abdomen and pelvis. A transition zone is seen within the mid sigmoid colon. Twisting of the mesentery is noted within this region (axial CT images 78 through 87, CT series 2). The small bowel loops are predominantly decompressed. The appendix is normal in appearance. Vascular/Lymphatic: Aortic atherosclerosis. No enlarged abdominal or pelvic lymph nodes. Reproductive: The prostate gland is not identified. Other: There is moderate severity anasarca involving predominantly the posterolateral aspects of the abdominal and pelvic walls. No abdominopelvic ascites. Musculoskeletal: Marked severity multilevel degenerative changes seen throughout the lumbar spine. IMPRESSION: 1. 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 2. Marked severity right lower lobe consolidation, consistent with pneumonia. 3. Numerous bilateral renal and proximal ureteral calculi of various sizes. 4. Multiple right renal cysts (Bosniak 2). No additional follow-up or imaging is recommended. This recommendation follows ACR consensus guidelines: Management of the Incidental Renal Mass on CT: A White Paper of the ACR Incidental Findings Committee. J Am Coll Radiol [phone]. 5. Moderate severity anasarca. 6. Aortic atherosclerosis. Electronically Signed   By: Aram Candela M.D.   On: 05/08/2022 23:25    Scheduled Meds:  ARIPiprazole  20 mg Per Tube Daily   ascorbic acid  500 mg Per Tube BID   buPROPion  150 mg Per Tube BID   carvedilol  25 mg Per Tube BID   Chlorhexidine Gluconate  Cloth  6 each Topical Daily   cholecalciferol  1,000 Units Per Tube q AM   dextromethorphan  5 mL Per Tube Q12H   ferrous sulfate  325 mg Per Tube Q breakfast   finasteride  5 mg Oral Daily   fluticasone furoate-vilanterol  1 puff Inhalation Daily   folic acid  1 mg Per Tube q AM   insulin aspart  0-15 Units Subcutaneous TID WC   ipratropium-albuterol  3 mL Nebulization Q6H   loratadine  10 mg Per Tube Daily   multivitamin with minerals  1 tablet Per Tube Daily   mouth rinse  15 mL Mouth Rinse 4 times per day   PHENObarbital  65 mg Intravenous QODAY   pravastatin  10 mg Per Tube QHS   sodium chloride flush  3 mL Intravenous Q12H   umeclidinium bromide  1 puff Inhalation Daily   Continuous Infusions:  sodium chloride Stopped (05/07/22 1030)   dextrose 5 % and 0.45 % NaCl with KCl 20 mEq/L 125 mL/hr at 05/10/22 1142   heparin 1,500 Units/hr (05/10/22 1211)     LOS: 8 days    Time spent: 50 mins    Indi Willhite, MD [facility]   If 7PM-7AM, please contact night-coverage

## 2022-05-10 NOTE — Consult Note (Signed)
[facility] Face-to-Face Psychiatry Consult   Reason for Consult: Consult for 81 year old man regarding capacity to consent for surgery Referring Physician: [name] Dee Patient Identification: [name] MRN:  [id number] Principal Diagnosis: Acute pulmonary embolism [facility]) Diagnosis:  Principal Problem:   Acute pulmonary embolism ([place]) Active Problems:   Hypokalemia   UTI (urinary tract infection)   DVT of lower extremity, bilateral (HCC)   BPH (benign prostatic hyperplasia)   Mixed hyperlipidemia   Seizure disorder ([place])   Venous insufficiency of both lower extremities   Hypertension   Depression   Chronic diastolic CHF (congestive heart failure) (HCC)   Normocytic anemia   COPD exacerbation (HCC)   Malnutrition of moderate degree   Acute deep vein thrombosis (DVT) of left lower extremity ([place])   Acute respiratory failure with hypoxia ([place])   Total Time spent with patient: 45 minutes  Subjective:   [name] is a 81 y.o. male patient admitted with "I could not eat anything".  HPI: Patient seen and chart reviewed.  81 year old man with multiple chronic medical problems also with a past history of bipolar disorder or schizophrenia.  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  Symptoms and acute crisis were resolved with nasogastric suction but surgery is recommended.  Patient was awake alert and pleasant when I found him.  He was able to tell me that his stomach stopped and that he was not able to eat anything and was feeling very sick.  He says that it feels better since he has the catheter in his stomach.  Patient understands that he has been told that he needs surgery.  I reviewed with the patient the basics of what a volvulus is and explained to him that such a thing may recur frequently and could lead to severe pain and possibly even death if there is necrosis in the bowel.  I explained the recommendation of  surgery that they go and and surgically repaired the situation.  Patient said he understood all of that.  He told me that he agreed to get surgery.  He said that if it was going to help him to have a lower risk of dying and feel better he would consent to it.  Patient was pleasant throughout the interview.  Denied any psychosis.  Denied depression or suicidal ideation  Past Psychiatric History: Listed as having a past history of bipolar disorder although I cannot find psychiatric notes.  Patient says he has had psychiatric treatment in the past and that he has had suicide attempts in the past but it was many years ago.  Risk to Self:   Risk to Others:   Prior Inpatient Therapy:   Prior Outpatient Therapy:    Past Medical History:  Past Medical History:  Diagnosis Date   Bipolar 1 disorder ([place])    C. difficile diarrhea 05/2016   history of ...   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   Schizophrenia ([place])    Seizure ([place])    on phenobarb   Urinary retention  frequently requiring foley placement   VRE (vancomycin resistant enterococcus) culture positive    urine    Past Surgical History:  Procedure Laterality Date   COLONOSCOPY WITH PROPOFOL N/A 05/09/2022   Procedure: COLONOSCOPY WITH PROPOFOL;  Surgeon: Jaynie Collins, DO;  Location: [facility] ENDOSCOPY;  Service: Gastroenterology;  Laterality: N/A;   EXTRACORPOREAL SHOCK WAVE LITHOTRIPSY Right 05/05/2017   Procedure: EXTRACORPOREAL SHOCK WAVE LITHOTRIPSY (ESWL);  Surgeon: Vanna Scotland, MD;  Location: [facility] ORS;  Service: Urology;  Laterality: Right;   IR CATHETER TUBE CHANGE   06/23/2017   IR CATHETER TUBE CHANGE  06/28/2017   Family History:  Family History  Problem Relation Age of Onset   Cancer Mother    Prostate cancer Neg Hx    Kidney disease Neg Hx    Kidney cancer Neg Hx    Bladder Cancer Neg Hx    Family Psychiatric  History: None reported Social History:  Social History   Substance and Sexual Activity  Alcohol Use No     Social History   Substance and Sexual Activity  Drug Use No    Social History   Socioeconomic History   Marital status: Single    Spouse name: Not on file   Number of children: Not on file   Years of education: Not on file   Highest education level: Not on file  Occupational History   Not on file  Tobacco Use   Smoking status: Never    Passive exposure: Never   Smokeless tobacco: Never  Vaping Use   Vaping Use: Never used  Substance and Sexual Activity   Alcohol use: No   Drug use: No   Sexual activity: Never  Other Topics Concern   Not on file  Social History Narrative   Not on file   Social Determinants of Health   Financial Resource Strain: Not on file  Food Insecurity: Not on file  Transportation Needs: Not on file  Physical Activity: Not on file  Stress: Not on file  Social Connections: Not on file   Additional Social History:    Allergies:   Allergies  Allergen Reactions   Penicillin G Rash and Other (See Comments)    Convulsions.    Has patient had a PCN reaction causing immediate rash, facial/tongue/throat swelling, SOB or lightheadedness with hypotension: not sure Has patient had a PCN reaction causing severe rash involving mucus membranes or skin necrosis: not sure Has patient had a PCN reaction that required hospitalization: not sure Has patient had a PCN reaction occurring within the last 10 years: not sure If all of the above answers are "NO", then may proceed with Cephalosporin use.    Labs:  Results for orders placed or performed during the hospital encounter of 05/02/22 (from  the past 48 hour(s))  Potassium     Status: Abnormal   Collection Time: 05/08/22  5:40 PM  Result Value Ref Range   Potassium 3.4 (L) 3.5 - 5.1 mmol/L    Comment: Performed at [facility], [address] [place], [place] [address]  Glucose, capillary     Status: None   Collection Time: 05/08/22  8:10 PM  Result Value Ref Range   Glucose-Capillary 93 70 - 99 mg/dL    Comment: Glucose reference range applies only to samples taken after fasting for at least 8 hours.  CBC with Differential/Platelet     Status: Abnormal   Collection Time: 05/09/22 12:12 AM  Result Value Ref Range   WBC 8.8  4.0 - 10.5 K/uL   RBC 4.21 (L) 4.22 - 5.81 MIL/uL   Hemoglobin 11.6 (L) 13.0 - 17.0 g/dL   HCT 63.1 (L) 49.7 - 02.6 %   MCV 88.1 80.0 - 100.0 fL   MCH 27.6 26.0 - 34.0 pg   MCHC 31.3 30.0 - 36.0 g/dL   RDW 37.8 (H) 58.8 - 50.2 %   Platelets 272 150 - 400 K/uL   nRBC 0.0 0.0 - 0.2 %   Neutrophils Relative % 74 %   Neutro Abs 6.4 1.7 - 7.7 K/uL   Lymphocytes Relative 19 %   Lymphs Abs 1.7 0.7 - 4.0 K/uL   Monocytes Relative 6 %   Monocytes Absolute 0.5 0.1 - 1.0 K/uL   Eosinophils Relative 1 %   Eosinophils Absolute 0.1 0.0 - 0.5 K/uL   Basophils Relative 0 %   Basophils Absolute 0.0 0.0 - 0.1 K/uL   Immature Granulocytes 0 %   Abs Immature Granulocytes 0.03 0.00 - 0.07 K/uL    Comment: Performed at [facility], [address] [place], [place] [address]  Lactic acid, plasma     Status: None   Collection Time: 05/09/22 12:12 AM  Result Value Ref Range   Lactic Acid, Venous 0.9 0.5 - 1.9 mmol/L    Comment: Performed at [facility], [address] [place], [place] [address]  Comprehensive metabolic panel     Status: Abnormal   Collection Time: 05/09/22 12:12 AM  Result Value Ref Range   Sodium 147 (H) 135 - 145 mmol/L   Potassium 3.2 (L) 3.5 - 5.1 mmol/L   Chloride 121 (H) 98 - 111 mmol/L   CO2 18 (L) 22 - 32 mmol/L   Glucose, Bld 107 (H) 70 - 99 mg/dL     Comment: Glucose reference range applies only to samples taken after fasting for at least 8 hours.   BUN 50 (H) 8 - 23 mg/dL   Creatinine, Ser 6.72 (H) 0.61 - 1.24 mg/dL   Calcium 8.9 8.9 - 09.4 mg/dL   Total Protein 6.7 6.5 - 8.1 g/dL   Albumin 2.7 (L) 3.5 - 5.0 g/dL   AST 10 (L) 15 - 41 U/L   ALT 9 0 - 44 U/L   Alkaline Phosphatase 64 38 - 126 U/L   Total Bilirubin 0.9 0.3 - 1.2 mg/dL   GFR, Estimated 37 (L) >60 mL/min    Comment: (NOTE) Calculated using the CKD-EPI Creatinine Equation (2021)    Anion gap 8 5 - 15    Comment: Performed at [facility], [address] [place], [place] [address]  APTT     Status: None   Collection Time: 05/09/22 12:12 AM  Result Value Ref Range   aPTT 35 24 - 36 seconds    Comment: Performed at [facility], [address] [place], [place] [address]  Protime-INR     Status: Abnormal   Collection Time: 05/09/22 12:12 AM  Result Value Ref Range   Prothrombin Time 16.1 (H) 11.4 - 15.2 seconds   INR 1.3 (H) 0.8 - 1.2    Comment: (NOTE) INR goal varies based on device and disease states. Performed at [facility], [address] [place], [place] [address]   Potassium     Status: Abnormal   Collection Time: 05/09/22 12:12 AM  Result Value Ref Range   Potassium 3.2 (L) 3.5 - 5.1 mmol/L    Comment: Performed at [facility], [address]  [place], [place] [address]  Procalcitonin     Status: None   Collection Time: 05/09/22 12:12 AM  Result Value Ref Range   Procalcitonin <0.10 ng/mL    Comment:        Interpretation: PCT (Procalcitonin) <= 0.5 ng/mL: Systemic infection (sepsis) is not likely. Local bacterial infection is possible. (NOTE)       Sepsis PCT Algorithm           Lower Respiratory Tract                                      Infection PCT Algorithm    ----------------------------     ----------------------------         PCT < 0.25 ng/mL                PCT < 0.10 ng/mL          Strongly  encourage             Strongly discourage   discontinuation of antibiotics    initiation of antibiotics    ----------------------------     -----------------------------       PCT 0.25 - 0.50 ng/mL            PCT 0.10 - 0.25 ng/mL               OR       >80% decrease in PCT            Discourage initiation of                                            antibiotics      Encourage discontinuation           of antibiotics    ----------------------------     -----------------------------         PCT >= 0.50 ng/mL              PCT 0.26 - 0.50 ng/mL               AND        <80% decrease in PCT             Encourage initiation of                                             antibiotics       Encourage continuation           of antibiotics    ----------------------------     -----------------------------        PCT >= 0.50 ng/mL                  PCT > 0.50 ng/mL               AND         increase in PCT                  Strongly encourage  initiation of antibiotics    Strongly encourage escalation           of antibiotics                                     -----------------------------                                           PCT <= 0.25 ng/mL                                                 OR                                        > 80% decrease in PCT                                      Discontinue / Do not initiate                                             antibiotics  Performed at [facility], [address] [place], [place] [address]   Magnesium     Status: None   Collection Time: 05/09/22  5:26 AM  Result Value Ref Range   Magnesium 2.4 1.7 - 2.4 mg/dL    Comment: Performed at [facility], [address] [place], [place] [address]  Phosphorus     Status: None   Collection Time: 05/09/22  5:26 AM  Result Value Ref Range   Phosphorus 3.7 2.5 - 4.6 mg/dL    Comment: Performed at [facility], [address] [place], [place] [address]  Heparin level (unfractionated)     Status: Abnormal   Collection Time: 05/09/22  5:57 AM  Result Value Ref Range   Heparin Unfractionated 0.14 (L) 0.30 - 0.70 IU/mL    Comment: (NOTE) The clinical reportable range upper limit is being lowered to >1.10 to align with the FDA approved guidance for the current laboratory assay.  If heparin results are below expected values, and patient dosage has  been confirmed, suggest follow up testing of antithrombin III levels. Performed at [facility], [address] [place], [place] [address]   Glucose, capillary     Status: Abnormal   Collection Time: 05/09/22  8:05 AM  Result Value Ref Range   Glucose-Capillary 101 (H) 70 - 99 mg/dL    Comment: Glucose reference range applies only to samples taken after fasting for at least 8 hours.  Glucose, capillary     Status: Abnormal   Collection Time: 05/09/22 11:29 AM  Result Value Ref Range   Glucose-Capillary 117 (H) 70 - 99 mg/dL    Comment: Glucose reference range applies only to samples taken after fasting for at least 8 hours.  Glucose, capillary     Status: Abnormal   Collection Time: 05/09/22  4:26 PM  Result Value Ref Range   Glucose-Capillary 122 (H) 70 - 99 mg/dL    Comment: Glucose reference range applies only to samples taken after fasting for at least 8 hours.  Glucose, capillary     Status: Abnormal   Collection Time: 05/09/22  8:40 PM  Result Value Ref Range   Glucose-Capillary 156 (H) 70 - 99 mg/dL    Comment: Glucose reference range applies only to samples taken after fasting for at least 8 hours.  Glucose, capillary     Status: Abnormal   Collection Time: 05/10/22 12:13 AM  Result Value Ref Range   Glucose-Capillary 126 (H) 70 - 99 mg/dL    Comment: Glucose reference range applies only to samples taken after fasting for at least 8 hours.  Glucose, capillary     Status: Abnormal   Collection Time: 05/10/22  4:22 AM  Result Value Ref  Range   Glucose-Capillary 146 (H) 70 - 99 mg/dL    Comment: Glucose reference range applies only to samples taken after fasting for at least 8 hours.  Basic metabolic panel     Status: Abnormal   Collection Time: 05/10/22  4:53 AM  Result Value Ref Range   Sodium 149 (H) 135 - 145 mmol/L   Potassium 2.8 (L) 3.5 - 5.1 mmol/L   Chloride 125 (H) 98 - 111 mmol/L   CO2 20 (L) 22 - 32 mmol/L   Glucose, Bld 135 (H) 70 - 99 mg/dL    Comment: Glucose reference range applies only to samples taken after fasting for at least 8 hours.   BUN 29 (H) 8 - 23 mg/dL   Creatinine, Ser 1.61 0.61 - 1.24 mg/dL   Calcium 8.3 (L) 8.9 - 10.3 mg/dL   GFR, Estimated 60 (L) >60 mL/min    Comment: (NOTE) Calculated using the CKD-EPI Creatinine Equation (2021)    Anion gap 4 (L) 5 - 15    Comment: Performed at [facility], [address] [place], [place] [address]  Magnesium     Status: None   Collection Time: 05/10/22  4:53 AM  Result Value Ref Range   Magnesium 2.1 1.7 - 2.4 mg/dL    Comment: Performed at [facility], [address], [place], [place] [address]  Phosphorus     Status: None   Collection Time: 05/10/22  4:53 AM  Result Value Ref Range   Phosphorus 2.8 2.5 - 4.6 mg/dL    Comment: Performed at [facility], [address] [place], [place] [address]  Glucose, capillary     Status: Abnormal   Collection Time: 05/10/22  8:40 AM  Result Value Ref Range   Glucose-Capillary 126 (H) 70 - 99 mg/dL    Comment: Glucose reference range applies only to samples taken after fasting for at least 8 hours.  Glucose, capillary     Status: Abnormal   Collection Time: 05/10/22 12:08 PM  Result Value Ref Range   Glucose-Capillary 111 (H) 70 - 99 mg/dL    Comment: Glucose reference range applies only to samples taken after fasting for at least 8 hours.  Basic metabolic panel     Status: Abnormal   Collection Time: 05/10/22  2:51 PM  Result Value Ref Range   Sodium 150 (H) 135 -  145 mmol/L   Potassium 2.9 (L) 3.5 - 5.1 mmol/L   Chloride 125 (H) 98 - 111 mmol/L   CO2 21 (L) 22 - 32 mmol/L   Glucose, Bld 118 (  H) 70 - 99 mg/dL    Comment: Glucose reference range applies only to samples taken after fasting for at least 8 hours.   BUN 23 8 - 23 mg/dL   Creatinine, Ser 1.611.11 0.61 - 1.24 mg/dL   Calcium 8.4 (L) 8.9 - 10.3 mg/dL   GFR, Estimated >09>60 >60>60 mL/min    Comment: (NOTE) Calculated using the CKD-EPI Creatinine Equation (2021)    Anion gap 4 (L) 5 - 15    Comment: Performed at [facility], [address] [place], [place] [id number]  Glucose, capillary     Status: Abnormal   Collection Time: 05/10/22  3:24 PM  Result Value Ref Range   Glucose-Capillary 118 (H) 70 - 99 mg/dL    Comment: Glucose reference range applies only to samples taken after fasting for at least 8 hours.    Current Facility-Administered Medications  Medication Dose Route Frequency Provider Last Rate Last Admin   0.9 %  sodium chloride infusion  250 mL Intravenous PRN Reva BoresPratt, Tanya S, MD   Stopped at 05/07/22 1030   acetaminophen (TYLENOL) tablet 650 mg  650 mg Per Tube Q6H PRN Cipriano BunkerKumar, Pardeep, MD       Or   acetaminophen (TYLENOL) suppository 650 mg  650 mg Rectal Q6H PRN Cipriano BunkerKumar, Pardeep, MD       albuterol (PROVENTIL) (2.5 MG/3ML) 0.083% nebulizer solution 2.5 mg  2.5 mg Nebulization Q2H PRN Reva BoresPratt, Tanya S, MD       ARIPiprazole (ABILIFY) tablet 20 mg  20 mg Per Tube Daily Cipriano BunkerKumar, Pardeep, MD   20 mg at 05/10/22 [id number]   ascorbic acid (VITAMIN C) tablet 500 mg  500 mg Per Tube BID Cipriano BunkerKumar, Pardeep, MD   500 mg at 05/10/22 0846   buPROPion [facility](WELLBUTRIN) tablet 150 mg  150 mg Per Tube BID Cipriano BunkerKumar, Pardeep, MD   150 mg at 05/10/22 0829   carvedilol (COREG) tablet 25 mg  25 mg Per Tube BID Cipriano BunkerKumar, Pardeep, MD   25 mg at 05/10/22 [id number]   Chlorhexidine Gluconate Cloth 2 % PADS 6 each  6 each Topical Daily Enedina FinnerPatel, Sona, MD   6 each at 05/10/22 1324   cholecalciferol (VITAMIN D3) 25 MCG (1000 UNIT)  tablet 1,000 Units  1,000 Units Per Tube q AM Cipriano BunkerKumar, Pardeep, MD   1,000 Units at 05/10/22 0845   dextromethorphan (DELSYM) 30 MG/5ML liquid 30 mg  5 mL Per Tube Q12H Cipriano BunkerKumar, Pardeep, MD   30 mg at 05/10/22 0836   dextrose 5 % and 0.45 % NaCl with KCl 20 mEq/L infusion   Intravenous Continuous Manuela SchwartzMorrison, Brenda, NP 125 mL/hr at 05/10/22 1142 New Bag at 05/10/22 1142   ferrous sulfate 220 (44 Fe) MG/5ML solution 325 mg  325 mg Per Tube Q breakfast Cipriano BunkerKumar, Pardeep, MD   325 mg at 05/10/22 0842   finasteride (PROSCAR) tablet 5 mg  5 mg Oral Daily Cipriano BunkerKumar, Pardeep, MD   5 mg at 05/10/22 0843   fluticasone furoate-vilanterol (BREO ELLIPTA) 100-25 MCG/ACT 1 puff  1 puff Inhalation Daily Reva BoresPratt, Tanya S, MD   1 puff at 05/10/22 0847   folic acid (FOLVITE) tablet 1 mg  1 mg Per Tube q AM Cipriano BunkerKumar, Pardeep, MD   1 mg at 05/10/22 0846   guaiFENesin (ROBITUSSIN) 100 MG/5ML liquid 5 mL  5 mL Per Tube Q4H PRN Cipriano BunkerKumar, Pardeep, MD       heparin ADULT infusion 100 units/mL (25000 units/23750mL)  1,500 Units/hr Intravenous Continuous Orson AloeAnderson, William M, [facility]  15 mL/hr at 05/10/22 1211 1,500 Units/hr at 05/10/22 1211   HYDROcodone-acetaminophen (NORCO/VICODIN) 5-325 MG per tablet 1-2 tablet  1-2 tablet Per Tube Q4H PRN Cipriano Bunker, MD   2 tablet at 05/08/22 0623   insulin aspart (novoLOG) injection 0-15 Units  0-15 Units Subcutaneous TID WC Reva Bores, MD   2 Units at 05/06/22 1616   ipratropium-albuterol (DUONEB) 0.5-2.5 (3) MG/3ML nebulizer solution 3 mL  3 mL Nebulization Q6H Reva Bores, MD   3 mL at 05/10/22 1415   lactulose (CHRONULAC) 10 GM/15ML solution 10 g  10 g Per Tube Q6H PRN Cipriano Bunker, MD   10 g at 05/08/22 1311   loratadine (CLARITIN) tablet 10 mg  10 mg Per Tube Daily Cipriano Bunker, MD   10 mg at 05/10/22 0846   morphine (PF) 2 MG/ML injection 1 mg  1 mg Intravenous Q6H PRN Cipriano Bunker, MD   1 mg at 05/08/22 2039   multivitamin with minerals tablet 1 tablet  1 tablet Per Tube Daily Cipriano Bunker,  MD   1 tablet at 05/10/22 7253   Oral care mouth rinse  15 mL Mouth Rinse PRN Enedina Finner, MD       Oral care mouth rinse  15 mL Mouth Rinse 4 times per day Enedina Finner, MD   15 mL at 05/10/22 1600   Oral care mouth rinse  15 mL Mouth Rinse PRN Enedina Finner, MD       PHENObarbital (LUMINAL) injection 65 mg  65 mg Intravenous Connye Burkitt, NP   65 mg at 05/10/22 0846   polyethylene glycol (MIRALAX / GLYCOLAX) packet 17 g  17 g Per Tube Daily PRN Cipriano Bunker, MD       potassium chloride (KLOR-CON) packet 40 mEq  40 mEq Per Tube BID Orson Aloe, [facility]       potassium chloride 10 mEq in 100 mL IVPB  10 mEq Intravenous Q1 Hr x 4 Murriel Hopper M, RPH       pravastatin (PRAVACHOL) tablet 10 mg  10 mg Per Tube QHS Cipriano Bunker, MD   10 mg at 05/09/22 2148   sodium chloride flush (NS) 0.9 % injection 3 mL  3 mL Intravenous Q12H Reva Bores, MD   3 mL at 05/10/22 0849   sodium chloride flush (NS) 0.9 % injection 3 mL  3 mL Intravenous PRN Reva Bores, MD       umeclidinium bromide (INCRUSE ELLIPTA) 62.5 MCG/ACT 1 puff  1 puff Inhalation Daily Reva Bores, MD   1 puff at 05/10/22 0847    Musculoskeletal: Strength & Muscle Tone: within normal limits Gait & Station: normal Patient leans: N/A            Psychiatric Specialty Exam:  Presentation  General Appearance: No data recorded Eye Contact:No data recorded Speech:No data recorded Speech Volume:No data recorded Handedness:No data recorded  Mood and Affect  Mood:No data recorded Affect:No data recorded  Thought Process  Thought Processes:No data recorded Descriptions of Associations:No data recorded Orientation:No data recorded Thought Content:No data recorded History of Schizophrenia/Schizoaffective disorder:No data recorded Duration of Psychotic Symptoms:No data recorded Hallucinations:No data recorded Ideas of Reference:No data recorded Suicidal Thoughts:No data recorded Homicidal  Thoughts:No data recorded  Sensorium  Memory:No data recorded Judgment:No data recorded Insight:No data recorded  Executive Functions  Concentration:No data recorded Attention Span:No data recorded Recall:No data recorded Fund of Knowledge:No data recorded Language:No data recorded  Psychomotor Activity  Psychomotor Activity:No data  recorded  Assets  Assets:No data recorded  Sleep  Sleep:No data recorded  Physical Exam: Physical Exam Vitals reviewed.  Constitutional:      Appearance: Normal appearance.  HENT:     Head: Normocephalic and atraumatic.     Mouth/Throat:     Pharynx: Oropharynx is clear.  Eyes:     Pupils: Pupils are equal, round, and reactive to light.  Cardiovascular:     Rate and Rhythm: Normal rate and regular rhythm.  Pulmonary:     Effort: Pulmonary effort is normal.     Breath sounds: Normal breath sounds.  Abdominal:     General: Abdomen is flat.     Palpations: Abdomen is soft.  Musculoskeletal:        General: Normal range of motion.  Skin:    General: Skin is warm and dry.  Neurological:     General: No focal deficit present.     Mental Status: He is alert. Mental status is at baseline.  Psychiatric:        Attention and Perception: Attention normal.        Mood and Affect: Mood normal.        Speech: Speech normal.        Behavior: Behavior is cooperative.        Thought Content: Thought content normal.        Cognition and Memory: Cognition normal.    Review of Systems  Constitutional: Negative.   HENT: Negative.    Eyes: Negative.   Respiratory: Negative.    Cardiovascular: Negative.   Gastrointestinal: Negative.   Musculoskeletal: Negative.   Skin: Negative.   Neurological: Negative.   Psychiatric/Behavioral: Negative.     Blood pressure (!) 155/82, pulse 69, temperature 97.8 F (36.6 C), resp. rate 16, height  (1.702 m), weight 99.3 kg, SpO2 99 %. Body mass index is 34.29 kg/m.  Treatment Plan Summary: Plan  patient has capacity to make the decision.  If he were to refuse we should reassess to find out why he has changed his mind because on the current interview I found the patient to understand what I explained to him and to be fully agreeable with the surgery.  Disposition: Patient does not meet criteria for psychiatric inpatient admission.  Mordecai Rasmussen, MD 05/10/2022 5:11 PM

## 2022-05-10 NOTE — Progress Notes (Signed)
Patient ID: ABDULLAH RIZZI, male   DOB: 07-14-1941, 81 y.o.   MRN: [id number]     [place] [facility] Day(s): 8.   Interval History: Patient seen and examined, no acute events or new complaints overnight. Patient reports having discomfort but no pain.  Denies nausea or vomiting.  Patient with NGT in place.  He does endorses that he wants to eat.  Vital signs in last 24 hours: [min-max] current  Temp:  [97.4 F (36.3 C)-98.6 F (37 C)] 97.4 F (36.3 C) (10/02 1159) Pulse Rate:  [71-83] 71 (10/02 1159) Resp:  [17-20] 20 (10/02 1159) BP: (136-145)/(76-85) 140/76 (10/02 1159) SpO2:  [96 %-100 %] 98 % (10/02 1419) Weight:  [99.3 kg] 99.3 kg (10/02 0611)     Height: 5\' 7"  (170.2 cm) Weight: 99.3 kg BMI (Calculated): 34.28   Physical Exam:  Constitutional: alert, cooperative and no distress  Respiratory: breathing non-labored at rest  Cardiovascular: regular rate and sinus rhythm  Gastrointestinal: soft, non-tender, and mild-distended  Labs:     Latest Ref Rng & Units 05/09/2022   12:12 AM 05/08/2022    4:34 AM 05/07/2022    8:21 AM  CBC  WBC 4.0 - 10.5 K/uL 8.8  8.5  8.7   Hemoglobin 13.0 - 17.0 g/dL 11.6  11.4  11.3   Hematocrit 39.0 - 52.0 % 37.1  35.7  35.1   Platelets 150 - 400 K/uL 272  271  225       Latest Ref Rng & Units 05/10/2022    4:53 AM 05/09/2022   12:12 AM 05/08/2022    5:40 PM  CMP  Glucose 70 - 99 mg/dL 135  107    BUN 8 - 23 mg/dL 29  50    Creatinine 0.61 - 1.24 mg/dL 1.23  1.85    Sodium 135 - 145 mmol/L 149  147    Potassium 3.5 - 5.1 mmol/L 2.8  3.2    3.2  3.4   Chloride 98 - 111 mmol/L 125  121    CO2 22 - 32 mmol/L 20  18    Calcium 8.9 - 10.3 mg/dL 8.3  8.9    Total Protein 6.5 - 8.1 g/dL  6.7    Total Bilirubin 0.3 - 1.2 mg/dL  0.9    Alkaline Phos 38 - 126 U/L  64    AST 15 - 41 U/L  10    ALT 0 - 44 U/L  9      Imaging studies: No new pertinent imaging studies   Assessment/Plan:  81 y.o. male with single problems,  complicated by pertinent comorbidities including acute pulmonary embolism on heparin drip, aspiration pneumonia on antibiotic therapy, diastolic CHF, seizure disorder, hypokalemia, COPD.  Sigmoid volvulus -S/p endoscopic decompression with rectal tube placement on 05/09/2022 -Continue with soft abdomen with mild distention.  X-ray shows possible recurrence of volvulus.  I again soft abdomen without any concern of acute abdomen. -Palliative care, social worker and at the committee has been involved.  Consensus is that if we assess that this surgery is medically necessary and we in boost phase we can sign consent between 2 physicians to proceed with surgery. -I think that patient needs partial colectomy with end colostomy creation as a palliative treatment.  Once we remove his rectal tube he is going to start getting dilated again with eventual obstruction and perforation. -We will get vascular surgery input regarding possible need of inferior vena cava stent versus holding anticoagulation  for the procedure. -Continue heparin drip in the meantime.  Possible surgical intervention on Wednesday since patient took last dose of Eliquis today. -Continue rectal tube in place -Continue n.p.o. due to persistent left intestinal dilation -I will continue to follow closely.  Gae Gallop, MD

## 2022-05-10 NOTE — TOC Progression Note (Signed)
Transition of Care [facility]) - Progression Note    Patient Details  Name: YOUSUF AGER MRN: [id number] Date of Birth: 30-Nov-1940  Transition of Care [facility]) CM/SW [place], [place] Phone Number: 05/10/2022, 10:59 AM  Clinical Narrative:     CSW spoke with MD  Dr. Sheppard Coil  on [place] . Case reviewing regarding patient having competency to make decisions and having no identified decision maker, no contacts listed in chart.   Per MD with [place], if surgery or treatment is needed and we have tried all avenues to find a decision maker and patient is not competent to make own decisions, that Physician and 1 consultant can state that surgery is appropriate.   In this case it could be both Dr. Peyton Najjar and Dr. [name] Dee agreeing to move forward with certain treatment or surgery etc.   Above information relayed to treatment team.   For long term solution, CSW has reached out to DSS and lvm with Harlene Salts at [phone] to initiate guardianship referral.    Expected Discharge Plan: [place] Barriers to Discharge: Continued Medical Work up  Expected Discharge Plan and Services Expected Discharge Plan: [place] Acute Care Choice: Resumption of Svcs/PTA Provider Living arrangements for the past 2 months: [place] Determinants of Health (SDOH) Interventions    Readmission Risk Interventions    05/04/2022   12:15 PM  Readmission Risk Prevention Plan  Transportation Screening Complete  Medication Review (RN Care Manager) Complete  PCP or Specialist appointment within 3-5 days of discharge Complete  SW Recovery Care/Counseling Consult Complete  [place] Complete

## 2022-05-10 NOTE — Anesthesia Postprocedure Evaluation (Signed)
Anesthesia Post Note  Patient: [name]  Procedure(s) Performed: COLONOSCOPY WITH PROPOFOL  Patient location during evaluation: Endoscopy Anesthesia Type: General Level of consciousness: awake and alert Pain management: pain level controlled Vital Signs Assessment: post-procedure vital signs reviewed and stable Respiratory status: spontaneous breathing, nonlabored ventilation, respiratory function stable and patient connected to nasal cannula oxygen Cardiovascular status: blood pressure returned to baseline and stable Postop Assessment: no apparent nausea or vomiting Anesthetic complications: no   No notable events documented.   Last Vitals:  Vitals:   05/10/22 0415 05/10/22 0804  BP: (!) 144/85   Pulse: 80   Resp: 18   Temp:    SpO2: 99% 98%    Last Pain:  Vitals:   05/09/22 2331  TempSrc: Oral  PainSc:                  [name]

## 2022-05-10 NOTE — Plan of Care (Signed)
  Problem: Education: Goal: Knowledge of disease or condition will improve Outcome: Not Progressing Note: Patient does not understand why he is unable to eat or drink. Patient continues to request food and beverages. Tele sitter in place to ensure patient does not remove ng tube.

## 2022-05-10 NOTE — Consult Note (Signed)
ANTICOAGULATION CONSULT NOTE - Initial Consult  Pharmacy Consult for apixaban to heparin Indication: pulmonary embolus and DVT  Allergies  Allergen Reactions   Penicillin G Rash and Other (See Comments)    Convulsions.    Has patient had a PCN reaction causing immediate rash, facial/tongue/throat swelling, SOB or lightheadedness with hypotension: not sure Has patient had a PCN reaction causing severe rash involving mucus membranes or skin necrosis: not sure Has patient had a PCN reaction that required hospitalization: not sure Has patient had a PCN reaction occurring within the last 10 years: not sure If all of the above answers are "NO", then may proceed with Cephalosporin use.    Patient Measurements: Height: 5\' 7"  (170.2 cm) Weight: 99.3 kg (218 lb 14.7 oz) IBW/kg (Calculated) : 66.1 Heparin Dosing Weight: 88.3 kg   Vital Signs: Temp: 97.9 F (36.6 C) (10/01 2331) Temp Source: Oral (10/01 2331) BP: 144/85 (10/02 0415) Pulse Rate: 80 (10/02 0415)  Labs: Recent Labs    05/08/22 0434 05/09/22 0012 05/09/22 0557 05/10/22 0453  HGB 11.4* 11.6*  --   --   HCT 35.7* 37.1*  --   --   PLT 271 272  --   --   APTT 41* 35  --   --   LABPROT  --  16.1*  --   --   INR  --  1.3*  --   --   HEPARINUNFRC 0.40  --  0.14*  --   CREATININE 1.79* 1.85*  --  1.23     Estimated Creatinine Clearance: 54.7 mL/min (by C-G formula based on SCr of 1.23 mg/dL).   Medical History: Past Medical History:  Diagnosis Date   Bipolar 1 disorder ([place])    C. difficile diarrhea 05/2016   history of ...   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    Schizophrenia ([place])    Seizure ([place])    on phenobarb   Urinary retention    frequently requiring foley placement   VRE (vancomycin resistant enterococcus) culture positive    urine    Medications:  Apixaban 10 mg (last dose: 10/2 @ 2297)  Assessment: 81 y.o. male with medical history significant of HTN, HLD, GERD, COPD, CHF (diastolic), seizure disorder presented to ED 05/02/22 with hypoxia and wheezing. CT chest showing PE as well as lower Doppler US showing subacute DVT. Patient has transitioned back and forth from apixaban and heparin a couple of times now, the first time due to NPO status, then returned to apixaban, and now back to heparin in anticipation of colonoscopy with possible biopsy, control of bleeding, and polypectomy, with additional interventions as necessary. Patient has received three doses of apixaban 10 mg, with the last dose on 10/2 @ 0842. Hgb, Hct, and PLT stable.  Goal of Therapy:  Heparin level 0.3-0.7 units/ml aPTT 66-102 seconds Monitor platelets by anticoagulation protocol: Yes  Date Time aPTT Rate/Comment       Baseline Labs: aPTT - 30; INR - 1.2 Hgb - 13.7; Plts - 273   Plan: Discontinue apixaban Initiate heparin with no bolus at rate  of 1500 un/hr (which is slightly reduced from previous therapeutic rate of 1650 un/hr before transitioning to apixaban) Check aPTT/Anti-Xa level in 8 hours and daily once consecutively therapeutic.  Titrate by aPTT's until lab correlation is noted, then titrate by anti-xa alone. Continue to monitor CBC daily while on heparin gtt.  Terie Purser, PharmD PGY-1 Pharmacy Resident 05/10/2022 9:44 AM

## 2022-05-10 NOTE — Ethics Note (Signed)
Ethics consult note: Ethical question - consent for procedure  In brief, Pricilla Riffle LCSW spoke to me regarding concern for inability to locate any NOK or other surrogate decision maker for this patient who is reported to lack capacity to make complex medical decisions but needs surgery. In [place], statute as below offers framework for who can make decisions for patients who lack capacity. While ethics committee members do NOT offer legal advice, please reference the below.   If surrogate decision maker(s), which in come cases can include the treating physicians, are acting in good faith and in accordance with patient's known wishes, it is ethically appropriate for these individuals to decide on the patient's behalf.     See: [place] 90. Medicine and Allied Occupations  90-21.13. Informed consent to health care treatment or procedure. In brief summary, this statute outlines that the following persons, in order indicated, are authorized to consent to medical treatment on behalf of the patient who does not demonstrate capacity to do so: Legally assigned guardian appointed by the court > healthcare agent appointed by legal healthcare power of attorney > healthcare agent appointed by the patient > legal spouse > majority of the patient's reasonably available parents and children who are at least 41 years of age > individual who has an established relationship with the patient, who is acting in good faith on behalf of the patient, and who can reliably convey the patient's wishes > attending physician with confirmation by another physician of the patient's condition and the necessity for treatment (unless in urgent/emergent situation)    Please reach out  to ethics with any further questions or concerns!

## 2022-05-10 NOTE — Progress Notes (Signed)
Notified NP Foust of potassium level of 2.8. No response from NP.

## 2022-05-10 NOTE — Consult Note (Signed)
PHARMACY CONSULT NOTE - FOLLOW UP  Pharmacy Consult for Electrolyte Monitoring and Replacement   Recent Labs: Potassium (mmol/L)  Date Value  05/10/2022 2.9 (L)  10/03/2012 3.1 (L)   Magnesium (mg/dL)  Date Value  05/10/2022 2.1  10/02/2012 1.3 (L)   Calcium (mg/dL)  Date Value  05/10/2022 8.4 (L)   Calcium, Total (mg/dL)  Date Value  10/03/2012 8.2 (L)   Albumin (g/dL)  Date Value  05/09/2022 2.7 (L)  10/02/2012 3.4   Phosphorus (mg/dL)  Date Value  05/10/2022 2.8   Sodium (mmol/L)  Date Value  05/10/2022 150 (H)  10/03/2012 141    Assessment: 81 y.o. male with medical history significant of HTN, HLD, GERD, COPD, CHF (diastolic), seizure disorder, BPH who comes in today with presumed COPD exacerbation. Found to have a PE, started on heparin infusion. Potassium continues to be low  Goal of Therapy:  WNL  Plan: UOP 0.2>0.55mL/k/h; Scr 2.07>1.85>1.23>1.11 K 2.8>2.9. Despite Kcl 10 mEq IV x 4; Kcl 40 mEq PO x 1 Will increase repletion to 10 mEq IV x 4 and 40 mEq PO x 2 Recheck BMP with AM labs tomorrow  Dara Hoyer, PharmD PGY-1 Pharmacy Resident 05/10/2022 4:13 PM

## 2022-05-10 NOTE — Consult Note (Signed)
ANTICOAGULATION CONSULT NOTE  Pharmacy Consult for heparin Indication: pulmonary embolus and DVT  Allergies  Allergen Reactions   Penicillin G Rash and Other (See Comments)    Convulsions.    Has patient had a PCN reaction causing immediate rash, facial/tongue/throat swelling, SOB or lightheadedness with hypotension: not sure Has patient had a PCN reaction causing severe rash involving mucus membranes or skin necrosis: not sure Has patient had a PCN reaction that required hospitalization: not sure Has patient had a PCN reaction occurring within the last 10 years: not sure If all of the above answers are "NO", then may proceed with Cephalosporin use.    Patient Measurements: Height: 5\' 7"  (170.2 cm) Weight: 99.3 kg (218 lb 14.7 oz) IBW/kg (Calculated) : 66.1 Heparin Dosing Weight: 88.3 kg   Vital Signs: Temp: 97.8 F (36.6 C) (10/02 1523) Temp Source: Oral (10/02 1159) BP: 155/82 (10/02 1523) Pulse Rate: 69 (10/02 1523)  Labs: Recent Labs    05/08/22 0434 05/09/22 0012 05/09/22 0557 05/10/22 0453 05/10/22 1451 05/10/22 1744  HGB 11.4* 11.6*  --   --   --   --   HCT 35.7* 37.1*  --   --   --   --   PLT 271 272  --   --   --   --   APTT 41* 35  --   --   --  73*  LABPROT  --  16.1*  --   --   --   --   INR  --  1.3*  --   --   --   --   HEPARINUNFRC 0.40  --  0.14*  --   --  >1.10*  CREATININE 1.79* 1.85*  --  1.23 1.11  --      Estimated Creatinine Clearance: 60.6 mL/min (by C-G formula based on SCr of 1.11 mg/dL).   Medical History: Past Medical History:  Diagnosis Date   Bipolar 1 disorder (HCC)    C. difficile diarrhea 05/2016   history of ...   CHF (congestive heart failure) (HCC)    Chronic kidney disease 05/21/2016   acute renal failure with sepsis and uti   COPD (chronic obstructive pulmonary disease) (HCC)    DVT (deep venous thrombosis) (HCC)    GERD (gastroesophageal reflux disease)    Hematuria    High cholesterol    History of kidney stones     Hypertension    Penile erosion 2018   d/t frequent foley catheters   Schizophrenia (HCC)    Seizure (HCC)    on phenobarb   Urinary retention    frequently requiring foley placement   VRE (vancomycin resistant enterococcus) culture positive    urine    Medications:  Apixaban 10 mg (last dose: 10/2 @ 12/2)  Assessment: 81 y.o. male with medical history significant of HTN, HLD, GERD, COPD, CHF (diastolic), seizure disorder presented to ED 05/02/22 with hypoxia and wheezing. CT chest showing PE as well as lower Doppler 05/04/22 showing subacute DVT. Patient has transitioned back and forth from apixaban and heparin a couple of times now, the first time due to NPO status, then returned to apixaban, and now back to heparin in anticipation of colonoscopy with possible biopsy, control of bleeding, and polypectomy, with additional interventions as necessary. Patient has received three doses of apixaban 10 mg, with the last dose on 10/2 @ 0842. Hgb, Hct, and PLT stable.  Goal of Therapy:  Heparin level 0.3-0.7 units/ml once aPTT and heparin level  correlate.  aPTT 66-102 seconds Monitor platelets by anticoagulation protocol: Yes  Date Time aPTT/HL Rate/Comment 10/2 1744 73/> 1.1 1500 units/hr.          Plan: aPTT is therapeutic. Will continue heparin infuson at 1500 units/hr. Will recheck aPTT in 8 hours. Heparin level and CBC with AM labs. Switch to heparin level once aPTT and heparin level correlate.    Eleonore Chiquito, PharmD.  05/10/2022 6:19 PM

## 2022-05-10 NOTE — Progress Notes (Signed)
Palliative:   [name] is lying quietly in bed.  He appears acutely/chronically ill and very frail.  He is alert, able to tell me his name, and that we are in the hospital.  He is also able to tell me the month.  He tells me that he lives in [place], although he is unable to tell me what kind of facility.  When I offer "a nursing home?"  He states agreement.  He is alert and oriented, able to make his basic needs known.  There is no family at bedside at this time.  I ask what happened to bring him into the hospital.  He states "my colon".  We talk about what the doctors have been telling him.  He tells me that he would take colon surgery if offered.  We talk about CODE STATUS, life support.  He tells me, "I did not think I could do that".  I shared the concern he notes that he would not be able to get off of the breathing machine/ventilator.  [name] tells me that he is wheelchair-bound at his nursing home.  He tells me that he has been able to stand and pivot himself into his wheelchair.  He shares that he has not had any issues with swallowing or been on any special diet previous to this admission.  He does have a wet voice quality.  Conference with attending, bedside nursing staff, transition of care team related to patient condition, needs, goals of care, disposition.  Plan: At this point continue full scope/full code.  Would like further discussion with surgeon about recommendations for surgery.   HCPOA: [name] states that he never married and has no children.  He has a brother and sister in [place] and [place] but he has not spoken with them in more than 10 years.  He tells me that he came to [place] for the [employer].  He tells me that he would like for his healthcare surrogate to be [name] who lives in [place].  He does not have contact information but wants me to "look it up".  He also shares that he has not spoken with [name] in 2 years.  50 minutes  [name],  NP Palliative medicine team Team phone [phone] Greater than 50% of this time was spent counseling and coordinating care related to the above assessment and plan.

## 2022-05-11 ENCOUNTER — Encounter
Admission: EM | Disposition: A | Discharge status: Skilled Nursing Facility | Payer: Self-pay | Source: Skilled Nursing Facility | Attending: Family Medicine

## 2022-05-11 DIAGNOSIS — K922 Gastrointestinal hemorrhage, unspecified: Secondary | ICD-10-CM

## 2022-05-11 DIAGNOSIS — Z515 Encounter for palliative care: Secondary | ICD-10-CM | POA: Diagnosis not present

## 2022-05-11 DIAGNOSIS — Z7189 Other specified counseling: Secondary | ICD-10-CM | POA: Diagnosis not present

## 2022-05-11 DIAGNOSIS — I2699 Other pulmonary embolism without acute cor pulmonale: Secondary | ICD-10-CM | POA: Diagnosis not present

## 2022-05-11 HISTORY — PX: IVC FILTER INSERTION: CATH118245

## 2022-05-11 LAB — BASIC METABOLIC PANEL
Anion gap: 2 — ABNORMAL LOW (ref 5–15)
BUN: 17 mg/dL (ref 8–23)
CO2: 19 mmol/L — ABNORMAL LOW (ref 22–32)
Calcium: 8 mg/dL — ABNORMAL LOW (ref 8.9–10.3)
Chloride: 127 mmol/L — ABNORMAL HIGH (ref 98–111)
Creatinine, Ser: 0.89 mg/dL (ref 0.61–1.24)
GFR, Estimated: >60 mL/min (ref >60)
Glucose, Bld: 157 mg/dL — ABNORMAL HIGH (ref 70–99)
Potassium: 2.5 mmol/L — CL (ref 3.5–5.1)
Sodium: 148 mmol/L — ABNORMAL HIGH (ref 135–145)

## 2022-05-11 LAB — GLUCOSE, CAPILLARY
Glucose-Capillary: 111 mg/dL — ABNORMAL HIGH (ref 70–99)
Glucose-Capillary: 119 mg/dL — ABNORMAL HIGH (ref 70–99)
Glucose-Capillary: 132 mg/dL — ABNORMAL HIGH (ref 70–99)
Glucose-Capillary: 135 mg/dL — ABNORMAL HIGH (ref 70–99)
Glucose-Capillary: 137 mg/dL — ABNORMAL HIGH (ref 70–99)
Glucose-Capillary: 143 mg/dL — ABNORMAL HIGH (ref 70–99)
Glucose-Capillary: 148 mg/dL — ABNORMAL HIGH (ref 70–99)

## 2022-05-11 LAB — CBC
HCT: 33.8 % — ABNORMAL LOW (ref 39.0–52.0)
Hemoglobin: 10.8 g/dL — ABNORMAL LOW (ref 13.0–17.0)
MCH: 27.8 pg (ref 26.0–34.0)
MCHC: 32 g/dL (ref 30.0–36.0)
MCV: 86.9 fL (ref 80.0–100.0)
Platelets: 243 10*3/uL (ref 150–400)
RBC: 3.89 MIL/uL — ABNORMAL LOW (ref 4.22–5.81)
RDW: 16 % — ABNORMAL HIGH (ref 11.5–15.5)
WBC: 7.1 10*3/uL (ref 4.0–10.5)
nRBC: 0 % (ref 0.0–0.2)

## 2022-05-11 LAB — APTT: aPTT: 117 seconds — ABNORMAL HIGH (ref 24–36)

## 2022-05-11 LAB — PHOSPHORUS
Phosphorus: 3.1 mg/dL (ref 2.5–4.6)
Phosphorus: 3 mg/dL (ref 2.5–4.6)

## 2022-05-11 LAB — MAGNESIUM
Magnesium: 2 mg/dL (ref 1.7–2.4)
Magnesium: 2 mg/dL (ref 1.7–2.4)

## 2022-05-11 LAB — POTASSIUM: Potassium: 2.9 mmol/L — ABNORMAL LOW (ref 3.5–5.1)

## 2022-05-11 SURGERY — IVC FILTER INSERTION
Anesthesia: Moderate Sedation

## 2022-05-11 MED ORDER — HEPARIN SODIUM (PORCINE) 1000 UNIT/ML IJ SOLN
INTRAMUSCULAR | Status: AC
  Filled 2022-05-11: qty 10

## 2022-05-11 MED ORDER — POTASSIUM CHLORIDE 10 MEQ/100ML IV SOLN
10.0000 meq | INTRAVENOUS | Status: AC
  Administered 2022-05-11 – 2022-05-12 (×4): 10 meq via INTRAVENOUS
  Filled 2022-05-11 (×4): qty 100

## 2022-05-11 MED ORDER — VANCOMYCIN HCL 500 MG/100ML IV SOLN
INTRAVENOUS | Status: AC | PRN
  Administered 2022-05-11: 1000 mg via INTRAVENOUS

## 2022-05-11 MED ORDER — FENTANYL CITRATE (PF) 100 MCG/2ML IJ SOLN
INTRAMUSCULAR | Status: DC | PRN
  Administered 2022-05-11: 12.5 ug via INTRAVENOUS

## 2022-05-11 MED ORDER — KCL IN DEXTROSE-NACL 40-5-0.45 MEQ/L-%-% IV SOLN
INTRAVENOUS | Status: DC
  Filled 2022-05-11 (×12): qty 1000

## 2022-05-11 MED ORDER — POTASSIUM CHLORIDE 20 MEQ PO PACK
40.0000 meq | PACK | ORAL | Status: AC
  Administered 2022-05-11 (×2): 40 meq
  Filled 2022-05-11 (×2): qty 2

## 2022-05-11 MED ORDER — MIDAZOLAM HCL 2 MG/2ML IJ SOLN
INTRAMUSCULAR | Status: DC | PRN
  Administered 2022-05-11: .5 mg via INTRAVENOUS

## 2022-05-11 MED ORDER — POTASSIUM CHLORIDE 10 MEQ/100ML IV SOLN
10.0000 meq | INTRAVENOUS | Status: DC
  Filled 2022-05-11 (×4): qty 100

## 2022-05-11 MED ORDER — ONDANSETRON HCL 4 MG/2ML IJ SOLN: 4.0000 mg | Freq: Four times a day (QID) | INTRAMUSCULAR | Status: DC | PRN

## 2022-05-11 MED ORDER — MIDAZOLAM HCL 5 MG/5ML IJ SOLN
INTRAMUSCULAR | Status: AC
  Filled 2022-05-11: qty 5

## 2022-05-11 MED ORDER — FENTANYL CITRATE (PF) 100 MCG/2ML IJ SOLN
INTRAMUSCULAR | Status: AC
  Filled 2022-05-11: qty 2

## 2022-05-11 MED ORDER — METHYLPREDNISOLONE SODIUM SUCC 125 MG IJ SOLR: 125.0000 mg | Freq: Once | INTRAMUSCULAR | Status: DC | PRN

## 2022-05-11 MED ORDER — POTASSIUM CHLORIDE 10 MEQ/100ML IV SOLN
10.0000 meq | INTRAVENOUS | Status: AC
  Administered 2022-05-11 (×4): 10 meq via INTRAVENOUS
  Filled 2022-05-11 (×6): qty 100

## 2022-05-11 MED ORDER — MIDAZOLAM HCL 2 MG/ML PO SYRP: 8.0000 mg | ORAL_SOLUTION | Freq: Once | ORAL | Status: DC | PRN

## 2022-05-11 MED ORDER — DIPHENHYDRAMINE HCL 50 MG/ML IJ SOLN: 50.0000 mg | Freq: Once | INTRAMUSCULAR | Status: DC | PRN

## 2022-05-11 MED ORDER — SODIUM CHLORIDE 0.9 % IV SOLN: INTRAVENOUS | Status: DC

## 2022-05-11 MED ORDER — POTASSIUM CHLORIDE 20 MEQ PO PACK
40.0000 meq | PACK | Freq: Once | ORAL | Status: AC
  Administered 2022-05-11: 40 meq
  Filled 2022-05-11: qty 2

## 2022-05-11 MED ORDER — VANCOMYCIN HCL IN DEXTROSE 1-5 GM/200ML-% IV SOLN
1000.0000 mg | INTRAVENOUS | Status: DC
  Filled 2022-05-11: qty 200

## 2022-05-11 MED ORDER — HYDROMORPHONE HCL 1 MG/ML IJ SOLN: 1.0000 mg | Freq: Once | INTRAMUSCULAR | Status: DC | PRN

## 2022-05-11 MED ORDER — IPRATROPIUM-ALBUTEROL 0.5-2.5 (3) MG/3ML IN SOLN
3.0000 mL | Freq: Two times a day (BID) | RESPIRATORY_TRACT | Status: DC
  Administered 2022-05-11 – 2022-05-13 (×5): 3 mL via RESPIRATORY_TRACT
  Filled 2022-05-11 (×6): qty 3

## 2022-05-11 MED ORDER — FAMOTIDINE 20 MG PO TABS: 40.0000 mg | ORAL_TABLET | Freq: Once | ORAL | Status: DC | PRN

## 2022-05-11 SURGICAL SUPPLY — 7 items
GUIDEWIRE SUPER STIFF .035X180 (WIRE) IMPLANT
KIT FEM OPTION ELITE FILTER (Filter) IMPLANT
NDL ENTRY 21GA 7CM ECHOTIP (NEEDLE) IMPLANT
NEEDLE ENTRY 21GA 7CM ECHOTIP (NEEDLE) ×1 IMPLANT
PACK ANGIOGRAPHY (CUSTOM PROCEDURE TRAY) ×1 IMPLANT
SET INTRO CAPELLA COAXIAL (SET/KITS/TRAYS/PACK) IMPLANT
SUT MNCRL AB 4-0 PS2 18 (SUTURE) IMPLANT

## 2022-05-11 NOTE — Final Consult Note (Signed)
ANTICOAGULATION CONSULT NOTE  Pharmacy Consult for heparin Indication: pulmonary embolus and DVT  Allergies  Allergen Reactions   Penicillin G Rash and Other (See Comments)    Convulsions.    Has patient had a PCN reaction causing immediate rash, facial/tongue/throat swelling, SOB or lightheadedness with hypotension: not sure Has patient had a PCN reaction causing severe rash involving mucus membranes or skin necrosis: not sure Has patient had a PCN reaction that required hospitalization: not sure Has patient had a PCN reaction occurring within the last 10 years: not sure If all of the above answers are "NO", then may proceed with Cephalosporin use.    Patient Measurements: Height: 5\' 7"  (170.2 cm) Weight: 97 kg (213 lb 13.5 oz) IBW/kg (Calculated) : 66.1 Heparin Dosing Weight: 88.3 kg   Vital Signs: Temp: 97.2 F (36.2 C) (10/03 0857) Temp Source: Oral (10/03 0407) BP: 155/71 (10/03 1354) Pulse Rate: 66 (10/03 1354)  Labs: Recent Labs    05/09/22 0012 05/09/22 0557 05/10/22 0453 05/10/22 1451 05/10/22 1744 05/11/22 0145 05/11/22 0456  HGB 11.6*  --   --   --   --   --  10.8*  HCT 37.1*  --   --   --   --   --  33.8*  PLT 272  --   --   --   --   --  243  APTT 35  --   --   --  73* 117*  --   LABPROT 16.1*  --   --   --   --   --   --   INR 1.3*  --   --   --   --   --   --   HEPARINUNFRC  --  0.14*  --   --  >1.10*  --   --   CREATININE 1.85*  --  1.23 1.11  --   --  0.89     Estimated Creatinine Clearance: 74.7 mL/min (by C-G formula based on SCr of 0.89 mg/dL).   Medical History: Past Medical History:  Diagnosis Date   Bipolar 1 disorder ([place])    C. difficile diarrhea 05/2016   history of ...   CHF (congestive heart failure) (HCC)    Chronic kidney disease 05/21/2016   acute renal failure with sepsis and uti   COPD (chronic obstructive pulmonary disease) (HCC)    DVT (deep venous thrombosis) (HCC)    GERD (gastroesophageal reflux disease)     Hematuria    High cholesterol    History of kidney stones    Hypertension    Penile erosion 2018   d/t frequent foley catheters   Schizophrenia ([place])    Seizure ([place])    on phenobarb   Urinary retention    frequently requiring foley placement   VRE (vancomycin resistant enterococcus) culture positive    urine    Medications:  Apixaban 10 mg (last dose: 10/2 @ [id number])  Assessment: 81 y.o. male with medical history significant of HTN, HLD, GERD, COPD, CHF (diastolic), seizure disorder presented to ED 05/02/22 with hypoxia and wheezing. CT chest showing PE as well as lower Doppler US showing subacute DVT. Patient has transitioned back and forth from apixaban and heparin a couple of times now, the first time due to NPO status, then returned to apixaban, and now back to heparin in anticipation of colonoscopy with possible biopsy, control of bleeding, and polypectomy, with additional interventions as necessary. Patient has received three doses of apixaban 10  mg, with the last dose on 10/2 @ 0842. Hgb, Hct, and PLT stable.  Goal of Therapy:  Heparin level 0.3-0.7 units/ml once aPTT and heparin level correlate.  aPTT 66-102 seconds Monitor platelets by anticoagulation protocol: Yes  Date Time aPTT/HL Rate/Comment 10/2 1744 73/> 1.1 1500 units/hr.  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 Please reconsult pharmacy after surgical repair if heparin is resumed.   Lorna Dibble, PharmD, [facility] Clinical Pharmacist 05/11/2022 2:43 PM

## 2022-05-11 NOTE — Progress Notes (Signed)
The patient is not capable of signing his own consent.  He has no family no power of attorney.  At this point he is at risk for death secondary to recurrent pulmonary embolism.  I have been requested by the medical service for IVC filter placement.  This is appropriate and indicated.  Under these conditions emergency consent protocol will be utilized.  Staff agree.

## 2022-05-11 NOTE — Consult Note (Signed)
ANTICOAGULATION CONSULT NOTE  Pharmacy Consult for heparin Indication: pulmonary embolus and DVT  Allergies  Allergen Reactions   Penicillin G Rash and Other (See Comments)    Convulsions.    Has patient had a PCN reaction causing immediate rash, facial/tongue/throat swelling, SOB or lightheadedness with hypotension: not sure Has patient had a PCN reaction causing severe rash involving mucus membranes or skin necrosis: not sure Has patient had a PCN reaction that required hospitalization: not sure Has patient had a PCN reaction occurring within the last 10 years: not sure If all of the above answers are "NO", then may proceed with Cephalosporin use.    Patient Measurements: Height: 5\' 7"  (170.2 cm) Weight: 97 kg (213 lb 13.5 oz) IBW/kg (Calculated) : 66.1 Heparin Dosing Weight: 88.3 kg   Vital Signs: Temp: 97.2 F (36.2 C) (10/03 0857) Temp Source: Oral (10/03 0407) BP: 155/71 (10/03 1354) Pulse Rate: 66 (10/03 1354)  Labs: Recent Labs    05/09/22 0012 05/09/22 0557 05/10/22 0453 05/10/22 1451 05/10/22 1744 05/11/22 0145 05/11/22 0456  HGB 11.6*  --   --   --   --   --  10.8*  HCT 37.1*  --   --   --   --   --  33.8*  PLT 272  --   --   --   --   --  243  APTT 35  --   --   --  73* 117*  --   LABPROT 16.1*  --   --   --   --   --   --   INR 1.3*  --   --   --   --   --   --   HEPARINUNFRC  --  0.14*  --   --  >1.10*  --   --   CREATININE 1.85*  --  1.23 1.11  --   --  0.89     Estimated Creatinine Clearance: 74.7 mL/min (by C-G formula based on SCr of 0.89 mg/dL).   Medical History: Past Medical History:  Diagnosis Date   Bipolar 1 disorder ([place])    C. difficile diarrhea 05/2016   history of ...   CHF (congestive heart failure) (HCC)    Chronic kidney disease 05/21/2016   acute renal failure with sepsis and uti   COPD (chronic obstructive pulmonary disease) (HCC)    DVT (deep venous thrombosis) (H[facility])    GERD (gastroesophageal reflux disease)     Hematuria    High cholesterol    History of kidney stones    Hypertension    Penile erosion 2018   d/t frequent foley catheters   Schizophrenia ([place])    Seizure ([place])    on phenobarb   Urinary retention    frequently requiring foley placement   VRE (vancomycin resistant enterococcus) culture positive    urine    Medications:  Apixaban 10 mg (last dose: 10/2 @ BG:[id number])  Assessment: 81 y.o. male with medical history significant of HTN, HLD, GERD, COPD, CHF (diastolic), seizure disorder presented to ED 05/02/22 with hypoxia and wheezing. CT chest showing PE as well as lower Doppler US showing subacute DVT. Patient has transitioned back and forth from apixaban and heparin a couple of times now, the first time due to NPO status, then returned to apixaban, and now back to heparin in anticipation of colonoscopy with possible biopsy, control of bleeding, and polypectomy, with additional interventions as necessary. Patient has received three doses of apixaban 10  mg, with the last dose on 10/2 @ 0842. Hgb, Hct, and PLT stable.  Goal of Therapy:  Heparin level 0.3-0.7 units/ml once aPTT and heparin level correlate.  aPTT 66-102 seconds Monitor platelets by anticoagulation protocol: Yes  Date Time aPTT/HL Rate/Comment 10/2 1744 73/> 1.1 1500 units/hr.  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 Please reconsult pharmacy after surgical repair if heparin is resumed.   Lorna Dibble, PharmD, [facility] Clinical Pharmacist 05/11/2022 2:39 PM

## 2022-05-11 NOTE — Progress Notes (Signed)
Palliative: [name] is lying quietly in bed.  He appears acutely/chronically ill and very frail.  He is alert, oriented x3.  He is very difficult to understand due to a wet voice quality and poor/lacking dentition.  I believe that he can make his basic needs known.  Bedside nursing staff is present attending to needs and administering medications.  We talk about his acute health problems.  We talk about the plan for IVC filter later this morning.  Although [name] is alert and oriented, he would clearly benefit from a trusted friend or relative to help with decision-making.  He was evaluated by psychiatry yesterday and deemed capable of making choices.  We talk about healthcare power of attorney again today.  I share that I was unable to locate [employer] in [place].  I ask if it would be acceptable for me to try to find his sister, [name] who lives in [place].  He is agreeable.  Basic search for [name] in [place] resulted now meaningful results.  Plan: At this point continue full scope/full code.  Going for IVC filter today.  Anticipate return to long-term care.  65 minutes [name], NP Palliative medicine team Team phone [phone] Greater than 50% of this time was spent counseling and coordinating care related to the above assessment and plan.

## 2022-05-11 NOTE — Consult Note (Signed)
PHARMACY CONSULT NOTE  Pharmacy Consult for Electrolyte Monitoring and Replacement   Recent Labs: Potassium (mmol/L)  Date Value  05/11/2022 2.9 (L)  10/03/2012 3.1 (L)   Magnesium (mg/dL)  Date Value  05/11/2022 2.0  10/02/2012 1.3 (L)   Calcium (mg/dL)  Date Value  05/11/2022 8.0 (L)   Calcium, Total (mg/dL)  Date Value  10/03/2012 8.2 (L)   Albumin (g/dL)  Date Value  05/09/2022 2.7 (L)  10/02/2012 3.4   Phosphorus (mg/dL)  Date Value  05/11/2022 3.0   Sodium (mmol/L)  Date Value  05/11/2022 148 (H)  10/03/2012 141   Assessment: 81 y.o. male with medical history significant of HTN, HLD, GERD, COPD, CHF (diastolic), seizure disorder, BPH who comes in today with presumed COPD exacerbation. Found to have a PE, started on heparin infusion. Potassium continues to be low.  Diet: NPO, has NGT in place MIVF: D5 1/2 NS + 20 mEq K/L at 125 cc/hr (60 mEq K+/day)  AKI resolved  Goal of Therapy:  Within normal limits  Plan: --Hypernatremia / hyperchloremia, on fluid resuscitation as above --K 2.5 >> 2.9 after receiving Kcl 40 mEq x 2 and Kcl 10 mEq IV x 4 and in addition to MIVF with potassium. Will change MIVF to D5 1/2 NS + 40 mEq K/L at 125 cc/hr (120 mEq K+/day). Will give another Kcl 40 mEq per tube x 1 dose and Kcl 10 mEq IV x 4 doses --Follow-up electrolytes with AM labs tomorrow  Benita Gutter  05/11/2022 5:19 PM

## 2022-05-11 NOTE — Consult Note (Signed)
PHARMACY CONSULT NOTE - FOLLOW UP  Pharmacy Consult for Electrolyte Monitoring and Replacement   Recent Labs: Potassium (mmol/L)  Date Value  05/11/2022 2.5 (LL)  10/03/2012 3.1 (L)   Magnesium (mg/dL)  Date Value  05/11/2022 2.0  10/02/2012 1.3 (L)   Calcium (mg/dL)  Date Value  05/11/2022 8.0 (L)   Calcium, Total (mg/dL)  Date Value  10/03/2012 8.2 (L)   Albumin (g/dL)  Date Value  05/09/2022 2.7 (L)  10/02/2012 3.4   Phosphorus (mg/dL)  Date Value  05/11/2022 3.1   Sodium (mmol/L)  Date Value  05/11/2022 148 (H)  10/03/2012 141    Assessment: 81 y.o. male with medical history significant of HTN, HLD, GERD, COPD, CHF (diastolic), seizure disorder, BPH who comes in today with presumed COPD exacerbation. Found to have a PE, started on heparin infusion. Potassium continues to be low  Goal of Therapy:  WNL  Plan: UOP 0.2>0.50mL/k/h; Scr 2.07>1.85>1.23>1.11>0.89 Pt now receiving PO meds per tube K 2.8>2.5. Despite Kcl 10 mEq IV x 4 and 40 mEq PO x 2 Suspect improving renal function may be contributing to more potassium needs Replete Kcl 10 mEq IV x 6 and 40 mEq PO x 2 Recheck K @ 1700 and BMP with AM labs tomorrow  Dara Hoyer, PharmD PGY-1 Pharmacy Resident 05/11/2022 7:48 AM

## 2022-05-11 NOTE — Progress Notes (Signed)
Pt. Leaking liquid stool "profusely" around rectal tube. Pt. Cleansed x 2 around perineum and rectum . Pt. Responds to verbal stimuli. MD to sign emergent consent for IVC filter placement.

## 2022-05-11 NOTE — Op Note (Signed)
[facility]   OPERATIVE NOTE    PRE-OPERATIVE DIAGNOSIS: DVT with PE  POST-OPERATIVE DIAGNOSIS: Same  PROCEDURE: 1.   Ultrasound guidance for vascular access to the right IJ vein 2.   Catheter placement into the inferior vena cava 3.   Inferior venacavogram 4.   Placement of a option Elite IVC filter  SURGEON: Hortencia Pilar  ASSISTANT(S): None  ANESTHESIA: Conscious sedation was administered by the interventional radiology RN under my direct supervision. IV Versed plus fentanyl were utilized. Continuous ECG, pulse oximetry and blood pressure was monitored throughout the entire procedure. Conscious sedation was for a total of 10 minutes.  ESTIMATED BLOOD LOSS: minimal  FINDING(S): 1.  Patent IVC  SPECIMEN(S):  none  Contrast: 10 cc  Fluoroscopy time: 0.9 minutes  INDICATIONS:   [name] is a 81 y.o. y.o. male who presents with PE and massive GI bleed.  Inferior vena cava filter is indicated for this reason.  Risks and benefits including filter thrombosis, migration, fracture, bleeding, and infection were all discussed.  Patient does not appear to have a reasonable understanding and emergency consent has been obtained.  DESCRIPTION: After obtaining full informed written consent, the patient was brought back to the vascular suite. The skin was sterilely prepped and draped in a sterile surgical field was created. Ultrasound was placed in a sterile sleeve. The right internal jugular vein was echolucent and compressible indicating patency. Image was recorded for the permanent record. The puncture was made under continuous real-time ultrasound guidance.  The right internal jugular vein was accessed under direct ultrasound guidance without difficulty with a micropuncture needle. Microwire was then advanced under fluoroscopic guidance without difficulty. Micro-sheath was then inserted and an Amplatz wire was then advanced under fluoroscopic guidance into  the inferior vena cava. The dilator is passed over the wire and the delivery sheath was placed into the inferior vena cava.  Inferior venacavogram was performed. This demonstrated a patent IVC with the level of the renal veins at L1.  The filter was then deployed into the inferior vena cava at the level of L3 just below the renal veins. The delivery sheath was then removed. Pressure was held. Sterile dressings were placed. The patient tolerated the procedure well and was taken to the recovery room in stable condition.  Interpretation: Inferior vena cava is widely patent.  Measures 20 mm in diameter.  Option Elite filter is deployed at the L3 level without difficulty  COMPLICATIONS: None  CONDITION: Stable  Hortencia Pilar  05/11/2022, 1:19 PM

## 2022-05-11 NOTE — Interval H&P Note (Signed)
History and Physical Interval Note:  05/11/2022 12:21 PM  [name]  has presented today for surgery, with the diagnosis of PE.  The various methods of treatment have been discussed with the patient and family. After consideration of risks, benefits and other options for treatment, the patient has consented to  Procedure(s): IVC FILTER INSERTION (N/A) as a surgical intervention.  The patient's history has been reviewed, patient examined, no change in status, stable for surgery.  I have reviewed the patient's chart and labs.  Questions were answered to the patient's satisfaction.     Hortencia Pilar

## 2022-05-11 NOTE — Consult Note (Signed)
[facility] Consult Note  MRN : [id number]  [name] is a 81 y.o. (1941/03/01) male who presents with chief complaint of  Chief Complaint  Patient presents with   Shortness of Breath  .   Consulting Physician: Herbert Pun, MD Reason for consult: IVC filter placement History of Present Illness: [name] is a 81 year old male with a previous medical history for hypertension, hyperlipidemia, GERD, COPD, heart failure who presented to [facility] with a presumed COPD exacerbation.  He was ultimately found to have an acute pulmonary embolism.  He was started on IV heparin and was ultimately found to have a DVT as well.  The patient was found to have a sigmoid volvus as well and underwent endoscopic decompression with rectal tube placement on 05/09/2022.  Unfortunately the patient has recurrence of the all of this.  Based on this they are planning for a partial colectomy with end colostomy creation as palliative treatment.  Due to this the patient will be required to stop anticoagulation for approximately 48 hours prior to his procedure. Current Facility-Administered Medications  Medication Dose Route Frequency Provider Last Rate Last Admin   0.9 %  sodium chloride infusion  250 mL Intravenous PRN Donnamae Jude, MD   Stopped at 05/07/22 1030   0.9 %  sodium chloride infusion   Intravenous Continuous Kris Hartmann, NP       acetaminophen (TYLENOL) tablet 650 mg  650 mg Per Tube Q6H PRN Shawna Clamp, MD       Or   acetaminophen (TYLENOL) suppository 650 mg  650 mg Rectal Q6H PRN Shawna Clamp, MD       albuterol (PROVENTIL) (2.5 MG/3ML) 0.083% nebulizer solution 2.5 mg  2.5 mg Nebulization Q2H PRN Donnamae Jude, MD       ARIPiprazole (ABILIFY) tablet 20 mg  20 mg Per Tube Daily Shawna Clamp, MD   20 mg at 05/11/22 1051   ascorbic acid (VITAMIN C) tablet 500 mg  500 mg Per Tube BID Shawna Clamp, MD   500 mg at  05/11/22 1041   buPROPion (WELLBUTRIN) tablet 150 mg  150 mg Per Tube BID Shawna Clamp, MD   150 mg at 05/11/22 1051   carvedilol (COREG) tablet 25 mg  25 mg Per Tube BID Shawna Clamp, MD   25 mg at 05/11/22 1042   Chlorhexidine Gluconate Cloth 2 % PADS 6 each  6 each Topical Daily Fritzi Mandes, MD   6 each at 05/10/22 1324   cholecalciferol (VITAMIN D3) 25 MCG (1000 UNIT) tablet 1,000 Units  1,000 Units Per Tube q AM Shawna Clamp, MD   1,000 Units at 05/11/22 1042   dextromethorphan (DELSYM) 30 MG/5ML liquid 30 mg  5 mL Per Tube Q12H Shawna Clamp, MD   30 mg at 05/11/22 1051   dextrose 5 % and 0.45 % NaCl with KCl 20 mEq/L infusion   Intravenous Continuous Sharion Settler, NP 125 mL/hr at 05/11/22 0743 New Bag at 05/11/22 0743   diphenhydrAMINE (BENADRYL) injection 50 mg  50 mg Intravenous Once PRN Kris Hartmann, NP       famotidine (PEPCID) tablet 40 mg  40 mg Per Tube Once PRN Kris Hartmann, NP       ferrous sulfate 220 (44 Fe) MG/5ML solution 325 mg  325 mg Per Tube Q breakfast Shawna Clamp, MD   325 mg at 05/11/22 1051   finasteride (PROSCAR) tablet 5 mg  5 mg Oral  Daily Shawna Clamp, MD   5 mg at 05/11/22 1042   fluticasone furoate-vilanterol (BREO ELLIPTA) 100-25 MCG/ACT 1 puff  1 puff Inhalation Daily Donnamae Jude, MD   1 puff at [id number]   folic acid (FOLVITE) tablet 1 mg  1 mg Per Tube q AM Shawna Clamp, MD   1 mg at 05/11/22 1042   guaiFENesin (ROBITUSSIN) 100 MG/5ML liquid 5 mL  5 mL Per Tube Q4H PRN Shawna Clamp, MD       heparin ADULT infusion 100 units/mL (25000 units/285mL)  1,350 Units/hr Intravenous Continuous Renda Rolls, RPH 13.5 mL/hr at 05/11/22 0313 1,350 Units/hr at 05/11/22 0313   HYDROcodone-acetaminophen (NORCO/VICODIN) 5-325 MG per tablet 1-2 tablet  1-2 tablet Per Tube Q4H PRN Shawna Clamp, MD   2 tablet at 05/08/22 [id number]   HYDROmorphone (DILAUDID) injection 1 mg  1 mg Intravenous Once PRN Kris Hartmann, NP       insulin aspart (novoLOG)  injection 0-15 Units  0-15 Units Subcutaneous TID WC Donnamae Jude, MD   2 Units at 05/06/22 1616   ipratropium-albuterol (DUONEB) 0.5-2.5 (3) MG/3ML nebulizer solution 3 mL  3 mL Nebulization BID Ottie Glazier, MD   3 mL at 05/11/22 0738   lactulose (CHRONULAC) 10 GM/15ML solution 10 g  10 g Per Tube Q6H PRN Shawna Clamp, MD   10 g at 05/08/22 1311   loratadine (CLARITIN) tablet 10 mg  10 mg Per Tube Daily Shawna Clamp, MD   10 mg at 05/11/22 1042   methylPREDNISolone sodium succinate (SOLU-MEDROL) 125 mg/2 mL injection 125 mg  125 mg Intravenous Once PRN Kris Hartmann, NP       midazolam (VERSED) 2 MG/ML syrup 8 mg  8 mg Per Tube Once PRN Kris Hartmann, NP       morphine (PF) 2 MG/ML injection 1 mg  1 mg Intravenous Q6H PRN Shawna Clamp, MD   1 mg at 05/08/22 2039   multivitamin with minerals tablet 1 tablet  1 tablet Per Tube Daily Shawna Clamp, MD   1 tablet at 05/11/22 1041   ondansetron (ZOFRAN) injection 4 mg  4 mg Intravenous Q6H PRN Kris Hartmann, NP       Oral care mouth rinse  15 mL Mouth Rinse PRN Fritzi Mandes, MD       Oral care mouth rinse  15 mL Mouth Rinse 4 times per day Fritzi Mandes, MD   15 mL at 05/10/22 2319   Oral care mouth rinse  15 mL Mouth Rinse PRN Fritzi Mandes, MD       PHENObarbital (LUMINAL) injection 65 mg  65 mg Intravenous Pollyann Kennedy, NP   65 mg at 05/10/22 0846   polyethylene glycol (MIRALAX / GLYCOLAX) packet 17 g  17 g Per Tube Daily PRN Shawna Clamp, MD       potassium chloride (KLOR-CON) packet 40 mEq  40 mEq Per Tube Q2H Beers, Brandon D, RPH   40 mEq at 05/11/22 1040   potassium chloride 10 mEq in 100 mL IVPB  10 mEq Intravenous Q1 Hr x 6 BeersShanon Brow, RPH 100 mL/hr at 05/11/22 1039 10 mEq at 05/11/22 1039   pravastatin (PRAVACHOL) tablet 10 mg  10 mg Per Tube QHS Shawna Clamp, MD   10 mg at 05/10/22 2210   sodium chloride flush (NS) 0.9 % injection 3 mL  3 mL Intravenous Q12H Donnamae Jude, MD   3 mL at 05/10/22 [phone]  sodium chloride flush (NS) 0.9 % injection 3 mL  3 mL Intravenous PRN Donnamae Jude, MD       umeclidinium bromide (INCRUSE ELLIPTA) 62.5 MCG/ACT 1 puff  1 puff Inhalation Daily Donnamae Jude, MD   1 puff at 05/10/22 0847   vancomycin (VANCOCIN) IVPB 1000 mg/200 mL premix  1,000 mg Intravenous 60 min Pre-Op Kris Hartmann, NP        Past Medical History:  Diagnosis Date   Bipolar 1 disorder ([place])    C. difficile diarrhea 05/2016   history of ...   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   Schizophrenia ([place])    Seizure ([place])    on phenobarb   Urinary retention    frequently requiring foley placement   VRE (vancomycin resistant enterococcus) culture positive    urine    Past Surgical History:  Procedure Laterality Date   COLONOSCOPY WITH PROPOFOL N/A 05/09/2022   Procedure: COLONOSCOPY WITH PROPOFOL;  Surgeon: Annamaria Helling, DO;  Location: [facility] ENDOSCOPY;  Service: Gastroenterology;  Laterality: N/A;   EXTRACORPOREAL SHOCK WAVE LITHOTRIPSY Right 05/05/2017   Procedure: EXTRACORPOREAL SHOCK WAVE LITHOTRIPSY (ESWL);  Surgeon: Hollice Espy, MD;  Location: [facility] ORS;  Service: Urology;  Laterality: Right;   IR CATHETER TUBE CHANGE  06/23/2017   IR CATHETER TUBE CHANGE  06/28/2017    Social History Social History   Tobacco Use   Smoking status: Never    Passive exposure: Never   Smokeless tobacco: Never  Vaping Use   Vaping Use: Never used  Substance Use Topics   Alcohol use: No   Drug use: No    Family History Family History  Problem Relation Age of Onset   Cancer Mother    Prostate cancer Neg Hx    Kidney disease Neg  Hx    Kidney cancer Neg Hx    Bladder Cancer Neg Hx     Allergies  Allergen Reactions   Penicillin G Rash and Other (See Comments)    Convulsions.    Has patient had a PCN reaction causing immediate rash, facial/tongue/throat swelling, SOB or lightheadedness with hypotension: not sure Has patient had a PCN reaction causing severe rash involving mucus membranes or skin necrosis: not sure Has patient had a PCN reaction that required hospitalization: not sure Has patient had a PCN reaction occurring within the last 10 years: not sure If all of the above answers are "NO", then may proceed with Cephalosporin use.     REVIEW OF SYSTEMS (Negative unless checked)  Constitutional: [] Weight loss  [] Fever  [] Chills Cardiac: [] Chest pain   [] Chest pressure   [] Palpitations   [] Shortness of breath when laying flat   [] Shortness of breath at rest   [] Shortness of breath with exertion. Vascular:  [] Pain in legs with walking   [] Pain in legs at rest   [] Pain in legs when laying flat   [] Claudication   [] Pain in feet when walking  [] Pain in feet at rest  [] Pain in feet when laying flat   [] History of DVT   [] Phlebitis   [] Swelling  in legs   [] Varicose veins   [] Non-healing ulcers Pulmonary:   [] Uses home oxygen   [] Productive cough   [] Hemoptysis   [] Wheeze  [] COPD   [] Asthma Neurologic:  [] Dizziness  [] Blackouts   [] Seizures   [] History of stroke   [] History of TIA  [] Aphasia   [] Temporary blindness   [] Dysphagia   [] Weakness or numbness in arms   [] Weakness or numbness in legs Musculoskeletal:  [] Arthritis   [] Joint swelling   [] Joint pain   [] Low back pain Hematologic:  [] Easy bruising  [] Easy bleeding   [] Hypercoagulable state   [] Anemic  [] Hepatitis Gastrointestinal:  [] Blood in stool   [] Vomiting blood  [] Gastroesophageal reflux/heartburn   [] Difficulty swallowing. Genitourinary:  [] Chronic kidney disease   [] Difficult urination  [] Frequent urination  [] Burning with urination   [] Blood in urine Skin:   [] Rashes   [] Ulcers   [] Wounds Psychological:  [] History of anxiety   []  History of major depression.  Physical Examination  Vitals:   05/10/22 2305 05/11/22 0407 05/11/22 0500 05/11/22 0857  BP: (!) 145/77 (!) 142/70  (!) 170/72  Pulse: 75 66  64  Resp: 20 18  16   Temp: 98.5 F (36.9 C) 97.7 F (36.5 C)  (!) 97.2 F (36.2 C)  TempSrc:  Oral    SpO2: 100% 98%  100%  Weight:   97 kg   Height:       Body mass index is 33.49 kg/m. Gen:  WD/WN, NAD,  Head: [place]/AT, No temporalis wasting. Prominent temp pulse not noted. Ear/Nose/Throat: Hearing grossly intact, nares w/o erythema or drainage, oropharynx w/o Erythema/Exudate Eyes: Sclera non-icteric, conjunctiva clear Neck: Trachea midline.  No JVD.  Pulmonary:  Good air movement, respirations not labored, equal bilaterally.  Cardiac: RRR, normal S1, S2. Vascular:  Vessel Right Left  Radial Palpable Palpable   Gastrointestinal: NGT in place, mild distended Musculoskeletal: M/S 5/5 throughout.  Extremities without ischemic changes.  No deformity or atrophy. No edema. Neurologic: Sensation grossly intact in extremities.  Symmetrical.  Speech is fluent. Motor exam as listed above. Psychiatric: Judgment intact, Mood & affect appropriate for pt's clinical situation. Dermatologic: No rashes or ulcers noted.  No cellulitis or open wounds. Lymph : No Cervical, Axillary, or Inguinal lymphadenopathy.    CBC Lab Results  Component Value Date   WBC 7.1 05/11/2022   HGB 10.8 (L) 05/11/2022   HCT 33.8 (L) 05/11/2022   MCV 86.9 05/11/2022   PLT 243 05/11/2022    BMET    Component Value Date/Time   NA 148 (H) 05/11/2022 0456   NA 141 10/03/2012 0524   K 2.5 (LL) 05/11/2022 0456   K 3.1 (L) 10/03/2012 0524   CL 127 (H) 05/11/2022 0456   CL 106 10/03/2012 0524   CO2 19 (L) 05/11/2022 0456   CO2 28 10/03/2012 0524   GLUCOSE 157 (H) 05/11/2022 0456   GLUCOSE 107 (H) 10/03/2012 0524   BUN 17 05/11/2022 0456   BUN 14 10/03/2012  0524   CREATININE 0.89 05/11/2022 0456   CREATININE 1.02 10/03/2012 0524   CALCIUM 8.0 (L) 05/11/2022 0456   CALCIUM 8.2 (L) 10/03/2012 0524   GFRNONAA >60 05/11/2022 0456   GFRNONAA >60 10/03/2012 0524   GFRAA >60 12/25/2016 0559   GFRAA >60 10/03/2012 0524   Estimated Creatinine Clearance: 74.7 mL/min (by C-G formula based on SCr of 0.89 mg/dL).  COAG Lab Results  Component Value Date   INR 1.3 (H) 05/09/2022   INR 1.2 05/02/2022   INR 1.3 (  H) 01/10/2022    Radiology DG Abd 1 View  Result Date: 05/10/2022 CLINICAL DATA:  Sigmoid volvulus of the colon. EXAM: ABDOMEN - 1 VIEW COMPARISON:  CT scan May 08, 2022.  KUBs May 09, 2022 FINDINGS: A rectal tube terminates in the right upper quadrant, stable in position. There is increasing colonic dilatation measuring up to 12 cm in caliber. The dilatation has increased significantly since the most recent KUB. There is a sharp turn in the rectal tube as it exits the patient. No small bowel dilatation. Bilateral renal stones identified. An NG tube terminates in the stomach. No other abnormalities. IMPRESSION: 1. A rectal tube terminates in the right upper quadrant. There is a sharp turn/possible kink in the rectal tube is at exits the patient. There is significant increased colonic dilatation since the most recent KUB measuring up to 12 cm in caliber. 2. No other interval changes. These results will be called to the ordering clinician or representative by the Radiologist Assistant, and communication documented in the PACS or [employer]. Electronically Signed   By: Dorise Bullion III M.D.   On: 05/10/2022 08:10   DG Abd 1 View  Result Date: 05/09/2022 CLINICAL DATA:  Follow-up sigmoid volvulus. EXAM: ABDOMEN - 1 VIEW COMPARISON:  CT, 05/08/2022.  Radiographs, 05/09/2022 at 3:12 a.m. FINDINGS: Gas-filled loops of small bowel and colon. There is colonic dilation that is decreased from the prior radiographs and more significantly  decreased when compared to the CT from 05/08/2022. Rectal/colonic tube tip projects in the right mid to upper abdomen. Stable suprapubic catheter. IMPRESSION: 1. Interval improvement with a decrease in colonic dilation compared to the exam earlier today, and more significantly improved compared to the previous day's CT. Electronically Signed   By: Lajean Manes M.D.   On: 05/09/2022 09:34   DG Abd 1 View  Result Date: 05/09/2022 CLINICAL DATA:  Status post decompression of sigmoid volvulus EXAM: ABDOMEN - 1 VIEW COMPARISON:  CT abdomen and pelvis 05/08/2022 FINDINGS: Enteric tube tip in the stomach with side port not well visualized but likely near the gastroesophageal junction. Redemonstrated marked gaseous distention of the colon, similar to slightly decreased from previous CT. Foley catheter in the bladder. IMPRESSION: Similar to slight decrease in gaseous distention of the colon compared to prior CT. Electronically Signed   By: Placido Sou M.D.   On: 05/09/2022 03:52   CT ABDOMEN PELVIS WO CONTRAST  Result Date: 05/08/2022 CLINICAL DATA:  Suspected bowel obstruction. EXAM: CT ABDOMEN AND PELVIS WITHOUT CONTRAST TECHNIQUE: Multidetector CT imaging of the abdomen and pelvis was performed following the standard protocol without IV contrast. RADIATION DOSE REDUCTION: This exam was performed according to the departmental dose-optimization program which includes automated exposure control, adjustment of the mA and/or kV according to patient size and/or use of iterative reconstruction technique. COMPARISON:  March 25, 2022 FINDINGS: Lower chest: Marked severity consolidation is seen within the posterior aspect of the right lung base. Mild posterolateral left basilar atelectasis is noted. Hepatobiliary: No focal liver abnormality is seen. The gallbladder is contracted without evidence of gallstones, gallbladder wall thickening, or biliary dilatation. Pancreas: Unremarkable. No pancreatic ductal dilatation  or surrounding inflammatory changes. Spleen: Normal in size without focal abnormality. Adrenals/Urinary Tract: Adrenal glands are unremarkable. Kidneys are normal in size with mild bilateral renal cortical thinning noted. Multiple cysts are seen within the right kidney. The largest measures approximately 6.9 cm x 4.9 cm. Numerous bilateral renal and proximal ureteral calculi of various sizes are seen. The  largest on the right measures approximately 1.3 cm, while the largest on the left measures 1.0 cm. A suprapubic catheter is seen within an empty urinary bladder. Stomach/Bowel: A nasogastric tube is seen with its distal tip noted within the body of the stomach. Numerous markedly dilated large bowel loops are seen throughout the abdomen and pelvis. A transition zone is seen within the mid sigmoid colon. Twisting of the mesentery is noted within this region (axial CT images 78 through 87, CT series 2). The small bowel loops are predominantly decompressed. The appendix is normal in appearance. Vascular/Lymphatic: Aortic atherosclerosis. No enlarged abdominal or pelvic lymph nodes. Reproductive: The prostate gland is not identified. Other: There is moderate severity anasarca involving predominantly the posterolateral aspects of the abdominal and pelvic walls. No abdominopelvic ascites. Musculoskeletal: Marked severity multilevel degenerative changes seen throughout the lumbar spine. IMPRESSION: 1. 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 2. Marked severity right lower lobe consolidation, consistent with pneumonia. 3. Numerous bilateral renal and proximal ureteral calculi of various sizes. 4. Multiple right renal cysts (Bosniak 2). No additional follow-up or imaging is recommended. This recommendation follows ACR consensus guidelines: Management of the Incidental Renal Mass on CT: A White Paper of the ACR Incidental Findings Committee. J Am  Coll Radiol [phone]. 5. Moderate severity anasarca. 6. Aortic atherosclerosis. Electronically Signed   By: Virgina Norfolk M.D.   On: 05/08/2022 23:25   DG Abd 1 View  Result Date: 05/08/2022 CLINICAL DATA:  [id number] NG tube placement EXAM: ABDOMEN - 1 VIEW COMPARISON:  May 08, 2022 FINDINGS: Incomplete visualization of the abdomen laterally and the pelvis. Carina is not visualized, limiting evaluation. Enteric tube tip projects over the expected region of the proximal stomach. Side port projects over the region of the distal esophagus. Marked gaseous dilation of loops of bowel, similar in comparison to prior. IMPRESSION: 1. Enteric tube side port projects over the distal esophagus. 2. Similar appearance of diffuse gaseous dilation of loops of bowel, possibly ileus. If concern for obstruction, recommend CT. Electronically Signed   By: Valentino Saxon M.D.   On: 05/08/2022 12:08   DG Abd 1 View  Result Date: 05/08/2022 CLINICAL DATA:  Check gastric catheter placement EXAM: ABDOMEN - 1 VIEW COMPARISON:  Film from the previous day. FINDINGS: Gastric catheter is again noted in the stomach. Scattered large and small bowel gas is again seen and stable. IMPRESSION: Gastric catheter in the stomach. Electronically Signed   By: Inez Catalina M.D.   On: 05/08/2022 00:32   DG Abd 1 View  Result Date: 05/07/2022 CLINICAL DATA:  Check gastric catheter placement EXAM: ABDOMEN - 1 VIEW COMPARISON:  None Available. FINDINGS: Gastric catheter is noted within the stomach. Scattered large and small bowel gas is again identified. This likely represents an ileus. Correlate with clinical exam. Degenerative changes of lumbar spine are noted. IMPRESSION: Gastric catheter in place. Considerable gaseous distension of the bowel which may represent an ileus. Electronically Signed   By: Inez Catalina M.D.   On: 05/07/2022 19:32   DG Chest Port 1 View  Result Date: 05/06/2022 CLINICAL DATA:  Pneumonia EXAM: PORTABLE  CHEST 1 VIEW COMPARISON:  05/04/2022 FINDINGS: Normal cardiac silhouette. Low lung volumes. Fine airspace disease in the RIGHT upper lobe is slightly improved from comparison exam. Elevated LEFT hemidiaphragm. No acute osseous abnormality. IMPRESSION: 1. Slight improvement in RIGHT upper lobe pneumonia. 2. Low lung volumes. Electronically Signed   By: Nicole Kindred  Leonia Reeves M.D.   On: 05/06/2022 09:29   DG Chest Port 1 View  Result Date: 05/04/2022 CLINICAL DATA:  Shortness of breath EXAM: PORTABLE CHEST 1 VIEW COMPARISON:  05/02/2022 FINDINGS: Patchy airspace disease in the right upper lobe. Heart is normal size. Aortic calcifications. No confluent opacity on the left. No effusions or acute bony abnormality. IMPRESSION: Patchy airspace opacity in the right upper lobe concerning for pneumonia. Electronically Signed   By: Rolm Baptise M.D.   On: 05/04/2022 21:01   US Venous Img Lower Bilateral (DVT)  Result Date: 05/04/2022 CLINICAL DATA:  81 year old male with pulmonary embolism in lower extremity swelling. EXAM: BILATERAL LOWER EXTREMITY VENOUS DOPPLER ULTRASOUND TECHNIQUE: Gray-scale sonography with graded compression, as well as color Doppler and duplex ultrasound were performed to evaluate the lower extremity deep venous systems from the level of the common femoral vein and including the common femoral, femoral, profunda femoral, popliteal and calf veins including the posterior tibial, peroneal and gastrocnemius veins when visible. The superficial great saphenous vein was also interrogated. Spectral Doppler was utilized to evaluate flow at rest and with distal augmentation maneuvers in the common femoral, femoral and popliteal veins. COMPARISON:  01/10/2022 FINDINGS: RIGHT LOWER EXTREMITY Common Femoral Vein: Nonocclusive, heterogeneously isoechoic thrombus is present peripherally. The common femoral vein appears patent with normal respiratory variation centrally. Saphenofemoral Junction: Nonocclusive,  heterogeneously isoechoic thrombus is present. Profunda Femoral Vein: No evidence of thrombus. Normal compressibility and flow on color Doppler imaging. Femoral Vein: Nonocclusive, heterogeneously isoechoic thrombus is present throughout. Popliteal Vein: No evidence of thrombus. Normal compressibility, respiratory phasicity and response to augmentation. Calf Veins: No evidence of thrombus. Normal compressibility and flow on color Doppler imaging. Other Findings:  None. LEFT LOWER EXTREMITY Common Femoral Vein: No evidence of thrombus. Normal compressibility, respiratory phasicity and response to augmentation. Saphenofemoral Junction: No evidence of thrombus. Normal compressibility and flow on color Doppler imaging. Profunda Femoral Vein: No evidence of thrombus. Normal compressibility and flow on color Doppler imaging. Femoral Vein: No evidence of thrombus. Normal compressibility, respiratory phasicity and response to augmentation. Popliteal Vein: No evidence of thrombus. Normal compressibility, respiratory phasicity and response to augmentation. Calf Veins: No evidence of thrombus. Normal compressibility and flow on color Doppler imaging. Other Findings:  None. IMPRESSION: 1. Subacute appearing right lower extremity deep vein thrombosis extending from the peripheral common femoral vein through the peripheral femoral vein. No definite evidence of iliac extension. 2. No evidence of left lower extremity deep vein thrombosis. Ruthann Cancer, MD [facility] Electronically Signed   By: Ruthann Cancer M.D.   On: 05/04/2022 08:08   CT HEAD WO CONTRAST (5MM)  Result Date: 05/04/2022 CLINICAL DATA:  Altered mental status, unequal pupils EXAM: CT HEAD WITHOUT CONTRAST TECHNIQUE: Contiguous axial images were obtained from the base of the skull through the vertex without intravenous contrast. RADIATION DOSE REDUCTION: This exam was performed according to the  departmental dose-optimization program which includes automated exposure control, adjustment of the mA and/or kV according to patient size and/or use of iterative reconstruction technique. COMPARISON:  01/10/2022 FINDINGS: Brain: No evidence of acute infarction, hemorrhage, mass, mass effect, or midline shift. No hydrocephalus or extra-axial fluid collection. Periventricular white matter changes, likely the sequela of chronic small vessel ischemic disease. Vascular: No hyperdense vessel. Atherosclerotic calcifications in the intracranial carotid and vertebral arteries. Skull: Normal. Negative for fracture or focal lesion. Sinuses/Orbits: No acute finding. Other: Fluid in the right mastoid air cells. IMPRESSION: No acute intracranial process. Electronically  Signed   By: Merilyn Baba M.D.   On: 05/04/2022 02:25   CT Angio Chest PE W and/or Wo Contrast  Result Date: 05/02/2022 CLINICAL DATA:  Pulmonary embolism (PE) suspected, high prob. Shortness of breath EXAM: CT ANGIOGRAPHY CHEST WITH CONTRAST TECHNIQUE: Multidetector CT imaging of the chest was performed using the standard protocol during bolus administration of intravenous contrast. Multiplanar CT image reconstructions and MIPs were obtained to evaluate the vascular anatomy. RADIATION DOSE REDUCTION: This exam was performed according to the departmental dose-optimization program which includes automated exposure control, adjustment of the mA and/or kV according to patient size and/or use of iterative reconstruction technique. CONTRAST:  13mL OMNIPAQUE IOHEXOL 350 MG/ML SOLN COMPARISON:  10/02/2012 FINDINGS: Cardiovascular: Filling defects within right lower lobe pulmonary arterial branches compatible with pulmonary emboli. No pulmonary emboli on the left. No evidence of right heart strain. Heart is normal size. Aorta normal caliber. Coronary artery and aortic calcifications. Mediastinum/Nodes: No mediastinal, hilar, or axillary adenopathy. Trachea and  esophagus are unremarkable. Thyroid unremarkable. Lungs/Pleura: Elevation of the right hemidiaphragm. Right basilar opacity could reflect atelectasis or early infarct. Left pendant atelectasis. No effusions. Upper Abdomen: Gaseous distention of bowel in the upper abdomen. Musculoskeletal: Chest wall soft tissues are unremarkable. No acute bony abnormality. Review of the MIP images confirms the above findings. IMPRESSION: Filling defects within right lower lobe pulmonary arterial branches compatible with pulmonary emboli. Associated right basilar airspace opacity could reflect atelectasis or infarcts. Elevation of the right hemidiaphragm. Coronary artery disease. Aortic Atherosclerosis (ICD10-I70.0). These results were called by telephone at the time of interpretation on 05/02/2022 at 11:47 pm to provider Merlyn Lot , who verbally acknowledged these results. Electronically Signed   By: Rolm Baptise M.D.   On: 05/02/2022 23:48   DG Chest Port 1 View  Result Date: 05/02/2022 CLINICAL DATA:  Questionable sepsis - evaluate for abnormality. Shortness of breath EXAM: PORTABLE CHEST 1 VIEW COMPARISON:  01/10/2022 FINDINGS: Heart and mediastinal contours are within normal limits. No focal opacities or effusions. No acute bony abnormality. IMPRESSION: No active disease. Electronically Signed   By: Rolm Baptise M.D.   On: 05/02/2022 22:06      Assessment/Plan 1.  Pulmonary embolism  Due to the patient's upcoming surgery he will need to hold anticoagulation for at least 48 hours, and given the patient's current pulmonary embolism as well as deep vein thrombosis this places him at increased risk for worsening due to pulmonary embolism.  Based on this we will have an IVC filter placed for the patient.  Once the patient is able to resume anticoagulation this filter can be removed.  This could possibly plan while he is inpatient or even outpatient.  However the patient is unable to consent.  In discussing the case  with Dr. Windell Moment, we will plan on having the patient undergo emergency consent.  I have discussed the procedure with the patient and he is agreeable to proceed.   Plan of care discussed with Dr.Dew and he is in agreement with plan noted above.   Family Communication:  Total Time: 80 minutes I spent 80 minutes in this encounter including personally reviewing extensive medical records, personally reviewing imaging studies and compared to prior scans, counseling the patient, placing orders, coordinating care and performing appropriate documentation  Thank you for allowing [place] to participate in the care of this patient.   Kris Hartmann, NP [facility] [phone] [place] Phone) [phone] (Office Fax) [phone] (Pager)  05/11/2022 11:35 AM  Staff may message me via secure chat in [place]  but this may not receive immediate response,  please page for urgent matters!  Dictation software was used to generate the above note. Typos may occur and escape review, as with typed/written notes. Any error is purely unintentional.  Please contact me directly for clarity if needed.

## 2022-05-11 NOTE — Progress Notes (Signed)
PROGRESS NOTE    [name]  WSF:[id number] DOB: 05-12-1941 DOA: 05/02/2022 PCP: Rosetta Posner, MD   Brief Narrative:  This 81 y.o. male with medical history significant of HTN, HLD, GERD, COPD, CHF (diastolic), seizure disorder, BPH who comes in ED with presumed COPD exacerbation.  Per the EDP he was wheezing on admission and hypoxic.  EMS had to put him on CPAP to get his oxygen saturations up.  His blood gases were essentially normal.  He was weaned down to nasal cannula. CT chest with contrast Filling defects within right lower lobe pulmonary arterial branches compatible with pulmonary emboli.  Patient was admitted for acute pulmonary embolism, started on IV heparin.  Patient has significant risk of aspiration.  Speech and swallow recommended n.p.o. with alternate means of feeding.  Palliative care consulted to discuss goals of care.  Patient has no capacity to make decision.  No alternate decision-maker has been identified at this time.  Patient has developed sigmoid volvulus overnight.  General surgery and GI consulted.  Patient underwent emergent colonoscopy with decompression of the volvulus.  Definitive treatment would be surgical correction.  Patient is at high risk for volvulus recurrence.  Decompression provides time but not a definite treatment.  Ethics committee consulted to make a determination since patient has no next of kin.  Psychiatry consulted patient does have a capacity to make decisions.  Patient is agreeable for surgical intervention.  Patient underwent IVC filter for pulmonary embolism , since patient needs to be off anticoagulation for 48 hours.  Patient is scheduled for surgical intervention tomorrow.  Assessment & Plan:   Principal Problem:   Pulmonary embolism (HCC) Active Problems:   UTI (urinary tract infection)   Chronic diastolic CHF (congestive heart failure) (HCC)   Seizure disorder (HCC)   Depression   Hypertension   Mixed hyperlipidemia   BPH (benign  prostatic hyperplasia)   Normocytic anemia   Hypokalemia   DVT of lower extremity, bilateral (HCC)   Venous insufficiency of both lower extremities   COPD exacerbation (HCC)   Malnutrition of moderate degree   Acute deep vein thrombosis (DVT) of left lower extremity (HCC)   Acute respiratory failure with hypoxia (HCC)  Acute pulmonary embolism / Acute Right LE DVT:  Patient found to have acute pulmonary embolism and acute Right LE DVT He was started on heparin gtt. Likely primary cause for his respiratory distress and oxygen requirements. Continue supplemental oxygen as needed. Patient underwent IVC filter placement 10/3.  UTI in the setting of chronic suprapubic catheter: Patient has indwelling suprapubic catheter and urine had a lot of sediment in it. Suprapubic catheter was changed  (05/03/22) It was changed on August 25th 2023 at urology office Remains afebrile without leukocytosis.    Aspiration pneumonia: Completed antibiotics for aspiration pneumonia. Chest x-ray shows improving infiltrate. NG tube inserted for medications and for feeding.  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  General surgery and GI consulted.   Patient underwent emergent colonoscopy with decompression of the volvulus with rectal tube.   Definitive treatment would be surgical correction.  Patient is at high risk for volvulus recurrence.   Decompression provides time but not a definite treatment.   Ethics committee consulted to make a determination since patient has no next of kin. General surgery recommended surgical intervention. Psych consulted.  Patient does have capacity to make decisions. Patient is scheduled for exploratory laparotomy with partial colectomy and end colostomy creation tomorrow.  Chronic diastolic CHF: Lisinopril, carvedilol and  Lasix on hold because of low blood pressure. BNP is slightly elevated but chest x-ray does not show fluid overload.   Seizure  disorder (HCC) Continue phenobarbital.   Depression Continue Wellbutrin, Abilify.  Hypokalemia: Replacement in progress.  Continue to monitor   Hypertension Hold lisinopril, carvedilol because of low blood pressure.   Mixed hyperlipidemia Continue Pravachol.   BPH (benign prostatic hyperplasia) Continue Proscar.   Normocytic anemia Hemoglobin is stable.   COPD: Currently no wheezing. Continue inhalers.   Venous insufficiency of both lower extremities Continue Lasix as needed   Dysphagia with high aspiration risk: Speech and swallow evaluation completed, high risk for aspiration recommended NPO. Completed course of antibiotics for aspiration pneumonia Palliative care consulted to discuss goals of care, alternate means of feeding. Continue NG tube for feeding and for medications  Obesity: Estimated body mass index is 33.49 kg/m as calculated from the following:   Height as of this encounter: 5\' 7"  (1.702 m).   Weight as of this encounter: 97 kg.      DVT prophylaxis: Eliquis Code Status: Full code Family Communication: No family at bedside Disposition Plan:   Status is: Inpatient Remains inpatient appropriate because: Admitted for acute PE, acute right LE DVT started on Eliquis.   Patient has no capacity to make decision.  No alternate decision-maker has been identified at this time. Patient has developed sigmoid volvulus overnight.  General surgery and GI consulted.  Patient underwent emergent colonoscopy with decompression of the volvulus.  Definitive treatment would be surgical correction.  Patient is at high risk for volvulus recurrence.  Decompression provides time but not a definite treatment.  Ethics committee consulted to make a determination since patient has no next of kin. Patient underwent IVC filter for acute PE. Scheduled for exploratory laparotomy with colostomy creation tomorrow.  Patient is not medically clear.   Consultants:  Palliative  care  Procedures: None Antimicrobials:  Anti-infectives (From admission, onward)    Start     Dose/Rate Route Frequency Ordered Stop   05/11/22 1246  vancomycin (VANCOREADY) IVPB 500 mg/100 mL        over 60 Minutes  Continuous PRN 05/11/22 1302     05/11/22 0830  vancomycin (VANCOCIN) IVPB 1000 mg/200 mL premix        1,000 mg 200 mL/hr over 60 Minutes Intravenous 60 min pre-op 05/11/22 0758     05/05/22 0215  cefTRIAXone (ROCEPHIN) 1 g in sodium chloride 0.9 % 100 mL IVPB        1 g 200 mL/hr over 30 Minutes Intravenous Every 24 hours 05/05/22 0118 05/09/22 [id number]       Subjective: Patient was seen and examined at bedside.  Overnight events noted.   Patient is awake, alert and oriented x3, following full commands. Status post endoscopic decompression of sigmoid volvulus.  She has a rectal tube. He continued to remain NPO.  Objective: Vitals:   05/11/22 1306 05/11/22 1311 05/11/22 1324 05/11/22 1330  BP: (!) 156/84 (!) 151/76 (!) 145/79 (!) 149/94  Pulse: 66 67 63 68  Resp: 19 19 18 16   Temp:      TempSrc:      SpO2: 100% 100% 100% 100%  Weight:      Height:        Intake/Output Summary (Last 24 hours) at 05/11/2022 1344 Last data filed at 05/11/2022 0648 Gross per 24 hour  Intake 3290.1 ml  Output 1600 ml  Net 1690.1 ml   Filed Weights   05/03/22 0842  05/10/22 0611 05/11/22 0500  Weight: 95 kg 99.3 kg 97 kg    Examination:  General exam: Appears comfortable, NAD, deconditioned, NG tube noted. Respiratory system: Decreased breath sounds, respiratory effort normal, RR 15. Cardiovascular system: S1 & S2 heard, regular rate and rhythm, no murmur.   Gastrointestinal system: Abdomen is soft, less distended, nontender, BS+, rectal tube noted + Central nervous system: Alert and oriented X 3 . No focal neurological deficits. Extremities: No edema, no cyanosis, no clubbing. Skin: No rashes, lesions or ulcers Psychiatry: Mood & affect appropriate.     Data Reviewed: I  have personally reviewed following labs and imaging studies  CBC: Recent Labs  Lab 05/06/22 0453 05/07/22 0821 05/08/22 0434 05/09/22 0012 05/11/22 0456  WBC 8.4 8.7 8.5 8.8 7.1  NEUTROABS  --   --   --  6.4  --   HGB 12.0* 11.3* 11.4* 11.6* 10.8*  HCT 37.1* 35.1* 35.7* 37.1* 33.8*  MCV 85.9 86.2 86.9 88.1 86.9  PLT 273 225 271 272 243   Basic Metabolic Panel: Recent Labs  Lab 05/06/22 0453 05/06/22 1737 05/08/22 0434 05/08/22 1740 05/09/22 0012 05/09/22 0526 05/10/22 0453 05/10/22 1451 05/10/22 1744 05/11/22 0456  NA 143   < > 145  --  147*  --  149* 150*  --  148*  K 2.4*   < > 2.8* 3.4* 3.2*  3.2*  --  2.8* 2.9*  --  2.5*  CL 113*   < > 118*  --  121*  --  125* 125*  --  127*  CO2 22   < > 17*  --  18*  --  20* 21*  --  19*  GLUCOSE 127*   < > 110*  --  107*  --  135* 118*  --  157*  BUN 49*   < > 52*  --  50*  --  29* 23  --  17  CREATININE 2.10*   < > 1.79*  --  1.85*  --  1.23 1.11  --  0.89  CALCIUM 8.5*   < > 8.7*  --  8.9  --  8.3* 8.4*  --  8.0*  MG 2.3   < > 2.4  --   --  2.4 2.1  --  2.1 2.0  PHOS 3.3  --   --   --   --  3.7 2.8  --  3.1 3.1   < > = values in this interval not displayed.   GFR: Estimated Creatinine Clearance: 74.7 mL/min (by C-G formula based on SCr of 0.89 mg/dL). Liver Function Tests: Recent Labs  Lab 05/09/22 0012  AST 10*  ALT 9  ALKPHOS 64  BILITOT 0.9  PROT 6.7  ALBUMIN 2.7*   No results for input(s): "LIPASE", "AMYLASE" in the last 168 hours. No results for input(s): "AMMONIA" in the last 168 hours. Coagulation Profile: Recent Labs  Lab 05/09/22 0012  INR 1.3*   Cardiac Enzymes: No results for input(s): "CKTOTAL", "CKMB", "CKMBINDEX", "TROPONINI" in the last 168 hours. BNP (last 3 results) No results for input(s): "PROBNP" in the last 8760 hours. HbA1C: No results for input(s): "HGBA1C" in the last 72 hours.  CBG: Recent Labs  Lab 05/10/22 2331 05/11/22 0012 05/11/22 0413 05/11/22 0900 05/11/22 1216   GLUCAP 95 132* 148* 135* 119*   Lipid Profile: No results for input(s): "CHOL", "HDL", "LDLCALC", "TRIG", "CHOLHDL", "LDLDIRECT" in the last 72 hours. Thyroid Function Tests: No results for input(s): "TSH", "T4TOTAL", "FREET4", "  T3FREE", "THYROIDAB" in the last 72 hours.  Anemia Panel: No results for input(s): "VITAMINB12", "FOLATE", "FERRITIN", "TIBC", "IRON", "RETICCTPCT" in the last 72 hours. Sepsis Labs: Recent Labs  Lab 05/06/22 0448 05/09/22 0012  PROCALCITON <0.10 <0.10  LATICACIDVEN  --  0.9    Recent Results (from the past 240 hour(s))  SARS Coronavirus 2 by RT PCR (hospital order, performed in [facility] hospital lab) *cepheid single result test* Anterior Nasal Swab     Status: None   Collection Time: 05/02/22  9:21 PM   Specimen: Anterior Nasal Swab  Result Value Ref Range Status   SARS Coronavirus 2 by RT PCR NEGATIVE NEGATIVE Final    Comment: (NOTE) SARS-CoV-2 target nucleic acids are NOT DETECTED.  The SARS-CoV-2 RNA is generally detectable in upper and lower respiratory specimens during the acute phase of infection. The lowest concentration of SARS-CoV-2 viral copies this assay can detect is 250 copies / mL. A negative result does not preclude SARS-CoV-2 infection and should not be used as the sole basis for treatment or other patient management decisions.  A negative result may occur with improper specimen collection / handling, submission of specimen other than nasopharyngeal swab, presence of viral mutation(s) within the areas targeted by this assay, and inadequate number of viral copies (<250 copies / mL). A negative result must be combined with clinical observations, patient history, and epidemiological information.  Fact Sheet for Patients:   RoadLapTop.co.za  Fact Sheet for Healthcare Providers: http://kim-miller.com/  This test is not yet approved or  cleared by the [place] FDA and has been  authorized for detection and/or diagnosis of SARS-CoV-2 by FDA under an Emergency Use Authorization (EUA).  This EUA will remain in effect (meaning this test can be used) for the duration of the COVID-19 declaration under Section 564(b)(1) of the Act, 21 U.S.C. section 360bbb-3(b)(1), unless the authorization is terminated or revoked sooner.  Performed at [facility], [address] [place], [place] [address]   Respiratory (~20 pathogens) panel by PCR     Status: None   Collection Time: 05/03/22  1:18 AM   Specimen: Nasopharyngeal Swab; Respiratory  Result Value Ref Range Status   Adenovirus NOT DETECTED NOT DETECTED Final   Coronavirus 229E NOT DETECTED NOT DETECTED Final    Comment: (NOTE) The Coronavirus on the Respiratory Panel, DOES NOT test for the novel  Coronavirus (2019 nCoV)    Coronavirus HKU1 NOT DETECTED NOT DETECTED Final   Coronavirus NL63 NOT DETECTED NOT DETECTED Final   Coronavirus OC43 NOT DETECTED NOT DETECTED Final   Metapneumovirus NOT DETECTED NOT DETECTED Final   Rhinovirus / Enterovirus NOT DETECTED NOT DETECTED Final   Influenza A NOT DETECTED NOT DETECTED Final   Influenza B NOT DETECTED NOT DETECTED Final   Parainfluenza Virus 1 NOT DETECTED NOT DETECTED Final   Parainfluenza Virus 2 NOT DETECTED NOT DETECTED Final   Parainfluenza Virus 3 NOT DETECTED NOT DETECTED Final   Parainfluenza Virus 4 NOT DETECTED NOT DETECTED Final   Respiratory Syncytial Virus NOT DETECTED NOT DETECTED Final   Bordetella pertussis NOT DETECTED NOT DETECTED Final   Bordetella Parapertussis NOT DETECTED NOT DETECTED Final   Chlamydophila pneumoniae NOT DETECTED NOT DETECTED Final   Mycoplasma pneumoniae NOT DETECTED NOT DETECTED Final    Comment: Performed at [facility], [address], [place], [place] [address]  MRSA Next Gen by PCR, Nasal     Status: None   Collection Time: 05/03/22 11:24 AM  Specimen: Nasal Mucosa; Nasal Swab  Result Value Ref Range  Status   MRSA by PCR Next Gen NOT DETECTED NOT DETECTED Final    Comment: (NOTE) The GeneXpert MRSA Assay (FDA approved for NASAL specimens only), is one component of a comprehensive MRSA colonization surveillance program. It is not intended to diagnose MRSA infection nor to guide or monitor treatment for MRSA infections. Test performance is not FDA approved in patients less than 11 years old. Performed at [facility], [address] [place], [place] [address]     Radiology Studies: PERIPHERAL VASCULAR CATHETERIZATION  Result Date: 05/11/2022 See surgical note for result.  DG Abd 1 View  Result Date: 05/10/2022 CLINICAL DATA:  Sigmoid volvulus of the colon. EXAM: ABDOMEN - 1 VIEW COMPARISON:  CT scan May 08, 2022.  KUBs May 09, 2022 FINDINGS: A rectal tube terminates in the right upper quadrant, stable in position. There is increasing colonic dilatation measuring up to 12 cm in caliber. The dilatation has increased significantly since the most recent KUB. There is a sharp turn in the rectal tube as it exits the patient. No small bowel dilatation. Bilateral renal stones identified. An NG tube terminates in the stomach. No other abnormalities. IMPRESSION: 1. A rectal tube terminates in the right upper quadrant. There is a sharp turn/possible kink in the rectal tube is at exits the patient. There is significant increased colonic dilatation since the most recent KUB measuring up to 12 cm in caliber. 2. No other interval changes. These results will be called to the ordering clinician or representative by the Radiologist Assistant, and communication documented in the PACS or [employer]. Electronically Signed   By: Dorise Bullion III M.D.   On: 05/10/2022 08:10    Scheduled Meds:  [MAR Hold] ARIPiprazole  20 mg Per Tube Daily   [MAR Hold] ascorbic acid  500 mg Per Tube BID   [MAR Hold] buPROPion  150 mg Per Tube BID   [MAR Hold] carvedilol  25 mg Per Tube BID   [MAR  Hold] Chlorhexidine Gluconate Cloth  6 each Topical Daily   [MAR Hold] cholecalciferol  1,000 Units Per Tube q AM   [MAR Hold] dextromethorphan  5 mL Per Tube Q12H   fentaNYL       [MAR Hold] ferrous sulfate  325 mg Per Tube Q breakfast   [MAR Hold] finasteride  5 mg Oral Daily   [MAR Hold] fluticasone furoate-vilanterol  1 puff Inhalation Daily   [MAR Hold] folic acid  1 mg Per Tube q AM   heparin sodium (porcine)       [MAR Hold] insulin aspart  0-15 Units Subcutaneous TID WC   [MAR Hold] ipratropium-albuterol  3 mL Nebulization BID   [MAR Hold] loratadine  10 mg Per Tube Daily   midazolam       [MAR Hold] multivitamin with minerals  1 tablet Per Tube Daily   [MAR Hold] mouth rinse  15 mL Mouth Rinse 4 times per day   [MAR Hold] PHENObarbital  65 mg Intravenous QODAY   [MAR Hold] pravastatin  10 mg Per Tube QHS   [MAR Hold] sodium chloride flush  3 mL Intravenous Q12H   [MAR Hold] umeclidinium bromide  1 puff Inhalation Daily   Continuous Infusions:  [MAR Hold] sodium chloride Stopped (05/07/22 1030)   sodium chloride     dextrose 5 % and 0.45 % NaCl with KCl 20 mEq/L 125 mL/hr at 05/11/22 0743   heparin Stopped (05/11/22 1205)   [  MAR Hold] potassium chloride 10 mEq (05/11/22 1300)   vancomycin     vancomycin 1,000 mg (05/11/22 1246)     LOS: 9 days    Time spent: 50 mins    Montrelle Eddings, MD [facility]   If 7PM-7AM, please contact night-coverage

## 2022-05-11 NOTE — Progress Notes (Signed)
Patient ID: [name], male   DOB: 1940/12/25, 81 y.o.   MRN: [id number]     [facility] Day(s): 9.   Interval History: Patient seen and examined, no acute events or new complaints overnight.  There has been no clinical deterioration in patient clinical status.  Patient denies any pain this morning.  Denies any nausea or vomiting.  Patient is alert, he is oriented in person, time and place.  Vital signs in last 24 hours: [min-max] current  Temp:  [97.2 F (36.2 C)-98.5 F (36.9 C)] 97.2 F (36.2 C) (10/03 0857) Pulse Rate:  [0-75] 67 (10/03 1311) Resp:  [13-20] 19 (10/03 1311) BP: (142-170)/(70-85) 151/76 (10/03 1311) SpO2:  [97 %-100 %] 100 % (10/03 1311) Weight:  [97 kg] 97 kg (10/03 0500)     Height: 5\' 7"  (170.2 cm) Weight: 97 kg BMI (Calculated): 33.49   Physical Exam:  Constitutional: alert, cooperative and no distress  Respiratory: breathing non-labored at rest  Cardiovascular: regular rate and sinus rhythm  Gastrointestinal: soft, non-tender, and mild-distended  Labs:     Latest Ref Rng & Units 05/11/2022    4:56 AM 05/09/2022   12:12 AM 05/08/2022    4:34 AM  CBC  WBC 4.0 - 10.5 K/uL 7.1  8.8  8.5   Hemoglobin 13.0 - 17.0 g/dL 10.8  11.6  11.4   Hematocrit 39.0 - 52.0 % 33.8  37.1  35.7   Platelets 150 - 400 K/uL 243  272  271       Latest Ref Rng & Units 05/11/2022    4:56 AM 05/10/2022    2:51 PM 05/10/2022    4:53 AM  CMP  Glucose 70 - 99 mg/dL 157  118  135   BUN 8 - 23 mg/dL 17  23  29    Creatinine 0.61 - 1.24 mg/dL 0.89  1.11  1.23   Sodium 135 - 145 mmol/L 148  150  149   Potassium 3.5 - 5.1 mmol/L 2.5  2.9  2.8   Chloride 98 - 111 mmol/L 127  125  125   CO2 22 - 32 mmol/L 19  21  20    Calcium 8.9 - 10.3 mg/dL 8.0  8.4  8.3     Imaging studies: No new pertinent imaging studies   Assessment/Plan:  81 y.o. male with single problems, complicated by pertinent comorbidities including acute pulmonary embolism on heparin drip,  aspiration pneumonia on antibiotic therapy, diastolic CHF, seizure disorder, hypokalemia, COPD.   Sigmoid volvulus -S/p endoscopic decompression with rectal tube placement on 05/09/2022 -No clinical duration. -X-ray yesterday shows recurrence of volvulus.  Abdominal physical exam without severe distention. -Have discussed with patient recommendation of surgical intervention to remove the redundant colon and do a colostomy.  Even though patient is alert, oriented x3 I do think that patient has the capacity to understand the implications and severity of his condition and the surgical intervention.  He has been discussed with Education officer, museum and at the committee recommendation is to proceed with surgical intervention if 2 physicians agreed that this is a medical necessity and decision is made in good phase for the benefit of the patient.  I think that this will improve patient quality of life. -I discussed case with vascular surgery due to presence of DVT with recent PE and the needs of holding anticoagulation for about 24 hours we will proceed with IVC filter placement. -Continue rectal tube in place -Continue n.p.o. -We will plan  for exploratory laparotomy with partial colectomy and end colostomy creation tomorrow. -Discussed with hospitalist and pharmacist to replace electrolyte as needed. -I will continue to follow closely.  Arnold Long, MD

## 2022-05-11 NOTE — H&P (View-Only) (Signed)
[facility] Vascular Consult Note  MRN : [id number]  [name] is a 81 y.o. (12/30/1940) male who presents with chief complaint of  Chief Complaint  Patient presents with   Shortness of Breath  .   Consulting Physician: Edgardo Cintron-Diaz, MD Reason for consult: IVC filter placement History of Present Illness: [name] is a 81-year-old male with a previous medical history for hypertension, hyperlipidemia, GERD, COPD, heart failure who presented to  [facility] with a presumed COPD exacerbation.  He was ultimately found to have an acute pulmonary embolism.  He was started on IV heparin and was ultimately found to have a DVT as well.  The patient was found to have a sigmoid volvus as well and underwent endoscopic decompression with rectal tube placement on 05/09/2022.  Unfortunately the patient has recurrence of the all of this.  Based on this they are planning for a partial colectomy with end colostomy creation as palliative treatment.  Due to this the patient will be required to stop anticoagulation for approximately 48 hours prior to his procedure. Current Facility-Administered Medications  Medication Dose Route Frequency Provider Last Rate Last Admin   0.9 %  sodium chloride infusion  250 mL Intravenous PRN Pratt, Tanya S, MD   Stopped at 05/07/22 1030   0.9 %  sodium chloride infusion   Intravenous Continuous Jimmylee Ratterree E, NP       acetaminophen (TYLENOL) tablet 650 mg  650 mg Per Tube Q6H PRN Kumar, Pardeep, MD       Or   acetaminophen (TYLENOL) suppository 650 mg  650 mg Rectal Q6H PRN Kumar, Pardeep, MD       albuterol (PROVENTIL) (2.5 MG/3ML) 0.083% nebulizer solution 2.5 mg  2.5 mg Nebulization Q2H PRN Pratt, Tanya S, MD       ARIPiprazole (ABILIFY) tablet 20 mg  20 mg Per Tube Daily Kumar, Pardeep, MD   20 mg at 05/11/22 1051   ascorbic acid (VITAMIN C) tablet 500 mg  500 mg Per Tube BID Kumar, Pardeep, MD   500 mg at  05/11/22 1041   buPROPion (WELLBUTRIN) tablet 150 mg  150 mg Per Tube BID Kumar, Pardeep, MD   150 mg at 05/11/22 1051   carvedilol (COREG) tablet 25 mg  25 mg Per Tube BID Kumar, Pardeep, MD   25 mg at 05/11/22 1042   Chlorhexidine Gluconate Cloth 2 % PADS 6 each  6 each Topical Daily Patel, Sona, MD   6 each at 05/10/22 1324   cholecalciferol (VITAMIN D3) 25 MCG (1000 UNIT) tablet 1,000 Units  1,000 Units Per Tube q AM Kumar, Pardeep, MD   1,000 Units at 05/11/22 1042   dextromethorphan (DELSYM) 30 MG/5ML liquid 30 mg  5 mL Per Tube Q12H Kumar, Pardeep, MD   30 mg at 05/11/22 1051   dextrose 5 % and 0.45 % NaCl with KCl 20 mEq/L infusion   Intravenous Continuous Morrison, Brenda, NP 125 mL/hr at 05/11/22 0743 New Bag at 05/11/22 0743   diphenhydrAMINE (BENADRYL) injection 50 mg  50 mg Intravenous Once PRN Deloyd Handy E, NP       famotidine (PEPCID) tablet 40 mg  40 mg Per Tube Once PRN Timothee Gali E, NP       ferrous sulfate 220 (44 Fe) MG/5ML solution 325 mg  325 mg Per Tube Q breakfast Kumar, Pardeep, MD   325 mg at 05/11/22 1051   finasteride (PROSCAR) tablet 5 mg  5 mg Oral   Daily Kumar, Pardeep, MD   5 mg at 05/11/22 1042   fluticasone furoate-vilanterol (BREO ELLIPTA) 100-25 MCG/ACT 1 puff  1 puff Inhalation Daily Pratt, Tanya S, MD   1 puff at 05/11/22 1039   folic acid (FOLVITE) tablet 1 mg  1 mg Per Tube q AM Kumar, Pardeep, MD   1 mg at 05/11/22 1042   guaiFENesin (ROBITUSSIN) 100 MG/5ML liquid 5 mL  5 mL Per Tube Q4H PRN Kumar, Pardeep, MD       heparin ADULT infusion 100 units/mL (25000 units/250mL)  1,350 Units/hr Intravenous Continuous Belue, [name] S, RPH 13.5 mL/hr at 05/11/22 0313 1,350 Units/hr at 05/11/22 0313   HYDROcodone-acetaminophen (NORCO/VICODIN) 5-325 MG per tablet 1-2 tablet  1-2 tablet Per Tube Q4H PRN Kumar, Pardeep, MD   2 tablet at 05/08/22 0623   HYDROmorphone (DILAUDID) injection 1 mg  1 mg Intravenous Once PRN Quanna Wittke E, NP       insulin aspart (novoLOG)  injection 0-15 Units  0-15 Units Subcutaneous TID WC Pratt, Tanya S, MD   2 Units at 05/06/22 1616   ipratropium-albuterol (DUONEB) 0.5-2.5 (3) MG/3ML nebulizer solution 3 mL  3 mL Nebulization BID Aleskerov, Fuad, MD   3 mL at 05/11/22 0738   lactulose (CHRONULAC) 10 GM/15ML solution 10 g  10 g Per Tube Q6H PRN Kumar, Pardeep, MD   10 g at 05/08/22 1311   loratadine (CLARITIN) tablet 10 mg  10 mg Per Tube Daily Kumar, Pardeep, MD   10 mg at 05/11/22 1042   methylPREDNISolone sodium succinate (SOLU-MEDROL) 125 mg/2 mL injection 125 mg  125 mg Intravenous Once PRN Cira Deyoe E, NP       midazolam (VERSED) 2 MG/ML syrup 8 mg  8 mg Per Tube Once PRN Mersades Barbaro E, NP       morphine (PF) 2 MG/ML injection 1 mg  1 mg Intravenous Q6H PRN Kumar, Pardeep, MD   1 mg at 05/08/22 2039   multivitamin with minerals tablet 1 tablet  1 tablet Per Tube Daily Kumar, Pardeep, MD   1 tablet at 05/11/22 1041   ondansetron (ZOFRAN) injection 4 mg  4 mg Intravenous Q6H PRN Ethelyn Cerniglia E, NP       Oral care mouth rinse  15 mL Mouth Rinse PRN Patel, Sona, MD       Oral care mouth rinse  15 mL Mouth Rinse 4 times per day Patel, Sona, MD   15 mL at 05/10/22 2319   Oral care mouth rinse  15 mL Mouth Rinse PRN Patel, Sona, MD       PHENObarbital (LUMINAL) injection 65 mg  65 mg Intravenous QODAY Morrison, Brenda, NP   65 mg at 05/10/22 0846   polyethylene glycol (MIRALAX / GLYCOLAX) packet 17 g  17 g Per Tube Daily PRN Kumar, Pardeep, MD       potassium chloride (KLOR-CON) packet 40 mEq  40 mEq Per Tube Q2H Beers, Brandon D, RPH   40 mEq at 05/11/22 1040   potassium chloride 10 mEq in 100 mL IVPB  10 mEq Intravenous Q1 Hr x 6 Beers, Brandon D, RPH 100 mL/hr at 05/11/22 1039 10 mEq at 05/11/22 1039   pravastatin (PRAVACHOL) tablet 10 mg  10 mg Per Tube QHS Kumar, Pardeep, MD   10 mg at 05/10/22 2210   sodium chloride flush (NS) 0.9 % injection 3 mL  3 mL Intravenous Q12H Pratt, Tanya S, MD   3 mL at 05/10/22 0849      sodium chloride flush (NS) 0.9 % injection 3 mL  3 mL Intravenous PRN Donnamae Jude, MD       umeclidinium bromide (INCRUSE ELLIPTA) 62.5 MCG/ACT 1 puff  1 puff Inhalation Daily Donnamae Jude, MD   1 puff at 05/10/22 0847   vancomycin (VANCOCIN) IVPB 1000 mg/200 mL premix  1,000 mg Intravenous 60 min Pre-Op Kris Hartmann, NP        Past Medical History:  Diagnosis Date   Bipolar 1 disorder ([place])    C. difficile diarrhea 05/2016   history of ...   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   Schizophrenia ([place])    Seizure ([place])    on phenobarb   Urinary retention    frequently requiring foley placement   VRE (vancomycin resistant enterococcus) culture positive    urine    Past Surgical History:  Procedure Laterality Date   COLONOSCOPY WITH PROPOFOL N/A 05/09/2022   Procedure: COLONOSCOPY WITH PROPOFOL;  Surgeon: Annamaria Helling, DO;  Location: [facility] ENDOSCOPY;  Service: Gastroenterology;  Laterality: N/A;   EXTRACORPOREAL SHOCK WAVE LITHOTRIPSY Right 05/05/2017   Procedure: EXTRACORPOREAL SHOCK WAVE LITHOTRIPSY (ESWL);  Surgeon: Hollice Espy, MD;  Location: [facility] ORS;  Service: Urology;  Laterality: Right;   IR CATHETER TUBE CHANGE  06/23/2017   IR CATHETER TUBE CHANGE  06/28/2017    Social History Social History   Tobacco Use   Smoking status: Never    Passive exposure: Never   Smokeless tobacco: Never  Vaping Use   Vaping Use: Never used  Substance Use Topics   Alcohol use: No   Drug use: No    Family History Family History  Problem Relation Age of Onset   Cancer Mother    Prostate cancer Neg Hx    Kidney disease Neg  Hx    Kidney cancer Neg Hx    Bladder Cancer Neg Hx     Allergies  Allergen Reactions   Penicillin G Rash and Other (See Comments)    Convulsions.    Has patient had a PCN reaction causing immediate rash, facial/tongue/throat swelling, SOB or lightheadedness with hypotension: not sure Has patient had a PCN reaction causing severe rash involving mucus membranes or skin necrosis: not sure Has patient had a PCN reaction that required hospitalization: not sure Has patient had a PCN reaction occurring within the last 10 years: not sure If all of the above answers are "NO", then may proceed with Cephalosporin use.     REVIEW OF SYSTEMS (Negative unless checked)  Constitutional: [] Weight loss  [] Fever  [] Chills Cardiac: [] Chest pain   [] Chest pressure   [] Palpitations   [] Shortness of breath when laying flat   [] Shortness of breath at rest   [] Shortness of breath with exertion. Vascular:  [] Pain in legs with walking   [] Pain in legs at rest   [] Pain in legs when laying flat   [] Claudication   [] Pain in feet when walking  [] Pain in feet at rest  [] Pain in feet when laying flat   [] History of DVT   [] Phlebitis   [] Swelling  in legs   []Varicose veins   []Non-healing ulcers Pulmonary:   []Uses home oxygen   []Productive cough   []Hemoptysis   []Wheeze  []COPD   []Asthma Neurologic:  []Dizziness  []Blackouts   []Seizures   []History of stroke   []History of TIA  []Aphasia   []Temporary blindness   []Dysphagia   []Weakness or numbness in arms   []Weakness or numbness in legs Musculoskeletal:  []Arthritis   []Joint swelling   []Joint pain   []Low back pain Hematologic:  []Easy bruising  []Easy bleeding   []Hypercoagulable state   []Anemic  []Hepatitis Gastrointestinal:  []Blood in stool   []Vomiting blood  []Gastroesophageal reflux/heartburn   []Difficulty swallowing. Genitourinary:  []Chronic kidney disease   []Difficult urination  []Frequent urination  []Burning with urination   []Blood in urine Skin:   []Rashes   []Ulcers   []Wounds Psychological:  []History of anxiety   [] History of major depression.  Physical Examination  Vitals:   05/10/22 2305 05/11/22 0407 05/11/22 0500 05/11/22 0857  BP: (!) 145/77 (!) 142/70  (!) 170/72  Pulse: 75 66  64  Resp: 20 18  16  Temp: 98.5 F (36.9 C) 97.7 F (36.5 C)  (!) 97.2 F (36.2 C)  TempSrc:  Oral    SpO2: 100% 98%  100%  Weight:   97 kg   Height:       Body mass index is 33.49 kg/m. Gen:  WD/WN, NAD,  Head: [place]/AT, No temporalis wasting. Prominent temp pulse not noted. Ear/Nose/Throat: Hearing grossly intact, nares w/o erythema or drainage, oropharynx w/o Erythema/Exudate Eyes: Sclera non-icteric, conjunctiva clear Neck: Trachea midline.  No JVD.  Pulmonary:  Good air movement, respirations not labored, equal bilaterally.  Cardiac: RRR, normal S1, S2. Vascular:  Vessel Right Left  Radial Palpable Palpable   Gastrointestinal: NGT in place, mild distended Musculoskeletal: M/S 5/5 throughout.  Extremities without ischemic changes.  No deformity or atrophy. No edema. Neurologic: Sensation grossly intact in extremities.  Symmetrical.  Speech is fluent. Motor exam as listed above. Psychiatric: Judgment intact, Mood & affect appropriate for pt's clinical situation. Dermatologic: No rashes or ulcers noted.  No cellulitis or open wounds. Lymph : No Cervical, Axillary, or Inguinal lymphadenopathy.    CBC Lab Results  Component Value Date   WBC 7.1 05/11/2022   HGB 10.8 (L) 05/11/2022   HCT 33.8 (L) 05/11/2022   MCV 86.9 05/11/2022   PLT 243 05/11/2022    BMET    Component Value Date/Time   NA 148 (H) 05/11/2022 0456   NA 141 10/03/2012 0524   K 2.5 (LL) 05/11/2022 0456   K 3.1 (L) 10/03/2012 0524   CL 127 (H) 05/11/2022 0456   CL 106 10/03/2012 0524   CO2 19 (L) 05/11/2022 0456   CO2 28 10/03/2012 0524   GLUCOSE 157 (H) 05/11/2022 0456   GLUCOSE 107 (H) 10/03/2012 0524   BUN 17 05/11/2022 0456   BUN 14 10/03/2012  0524   CREATININE 0.89 05/11/2022 0456   CREATININE 1.02 10/03/2012 0524   CALCIUM 8.0 (L) 05/11/2022 0456   CALCIUM 8.2 (L) 10/03/2012 0524   GFRNONAA >60 05/11/2022 0456   GFRNONAA >60 10/03/2012 0524   GFRAA >60 12/25/2016 0559   GFRAA >60 10/03/2012 0524   Estimated Creatinine Clearance: 74.7 mL/min (by C-G formula based on SCr of 0.89 mg/dL).  COAG Lab Results  Component Value Date   INR 1.3 (H) 05/09/2022   INR 1.2 05/02/2022   INR 1.3 (  H) 01/10/2022    Radiology DG Abd 1 View  Result Date: 05/10/2022 CLINICAL DATA:  Sigmoid volvulus of the colon. EXAM: ABDOMEN - 1 VIEW COMPARISON:  CT scan May 08, 2022.  KUBs May 09, 2022 FINDINGS: A rectal tube terminates in the right upper quadrant, stable in position. There is increasing colonic dilatation measuring up to 12 cm in caliber. The dilatation has increased significantly since the most recent KUB. There is a sharp turn in the rectal tube as it exits the patient. No small bowel dilatation. Bilateral renal stones identified. An NG tube terminates in the stomach. No other abnormalities. IMPRESSION: 1. A rectal tube terminates in the right upper quadrant. There is a sharp turn/possible kink in the rectal tube is at exits the patient. There is significant increased colonic dilatation since the most recent KUB measuring up to 12 cm in caliber. 2. No other interval changes. These results will be called to the ordering clinician or representative by the Radiologist Assistant, and communication documented in the PACS or [facility] Dashboard. Electronically Signed   By: David  Williams III M.D.   On: 05/10/2022 08:10   DG Abd 1 View  Result Date: 05/09/2022 CLINICAL DATA:  Follow-up sigmoid volvulus. EXAM: ABDOMEN - 1 VIEW COMPARISON:  CT, 05/08/2022.  Radiographs, 05/09/2022 at 3:12 a.m. FINDINGS: Gas-filled loops of small bowel and colon. There is colonic dilation that is decreased from the prior radiographs and more significantly  decreased when compared to the CT from 05/08/2022. Rectal/colonic tube tip projects in the right mid to upper abdomen. Stable suprapubic catheter. IMPRESSION: 1. Interval improvement with a decrease in colonic dilation compared to the exam earlier today, and more significantly improved compared to the previous day's CT. Electronically Signed   By: David  Ormond M.D.   On: 05/09/2022 09:34   DG Abd 1 View  Result Date: 05/09/2022 CLINICAL DATA:  Status post decompression of sigmoid volvulus EXAM: ABDOMEN - 1 VIEW COMPARISON:  CT abdomen and pelvis 05/08/2022 FINDINGS: Enteric tube tip in the stomach with side port not well visualized but likely near the gastroesophageal junction. Redemonstrated marked gaseous distention of the colon, similar to slightly decreased from previous CT. Foley catheter in the bladder. IMPRESSION: Similar to slight decrease in gaseous distention of the colon compared to prior CT. Electronically Signed   By: Tyler  Stutzman M.D.   On: 05/09/2022 03:52   CT ABDOMEN PELVIS WO CONTRAST  Result Date: 05/08/2022 CLINICAL DATA:  Suspected bowel obstruction. EXAM: CT ABDOMEN AND PELVIS WITHOUT CONTRAST TECHNIQUE: Multidetector CT imaging of the abdomen and pelvis was performed following the standard protocol without IV contrast. RADIATION DOSE REDUCTION: This exam was performed according to the departmental dose-optimization program which includes automated exposure control, adjustment of the mA and/or kV according to patient size and/or use of iterative reconstruction technique. COMPARISON:  March 25, 2022 FINDINGS: Lower chest: Marked severity consolidation is seen within the posterior aspect of the right lung base. Mild posterolateral left basilar atelectasis is noted. Hepatobiliary: No focal liver abnormality is seen. The gallbladder is contracted without evidence of gallstones, gallbladder wall thickening, or biliary dilatation. Pancreas: Unremarkable. No pancreatic ductal dilatation  or surrounding inflammatory changes. Spleen: Normal in size without focal abnormality. Adrenals/Urinary Tract: Adrenal glands are unremarkable. Kidneys are normal in size with mild bilateral renal cortical thinning noted. Multiple cysts are seen within the right kidney. The largest measures approximately 6.9 cm x 4.9 cm. Numerous bilateral renal and proximal ureteral calculi of various sizes are seen. The   largest on the right measures approximately 1.3 cm, while the largest on the left measures 1.0 cm. A suprapubic catheter is seen within an empty urinary bladder. Stomach/Bowel: A nasogastric tube is seen with its distal tip noted within the body of the stomach. Numerous markedly dilated large bowel loops are seen throughout the abdomen and pelvis. A transition zone is seen within the mid sigmoid colon. Twisting of the mesentery is noted within this region (axial CT images 78 through 87, CT series 2). The small bowel loops are predominantly decompressed. The appendix is normal in appearance. Vascular/Lymphatic: Aortic atherosclerosis. No enlarged abdominal or pelvic lymph nodes. Reproductive: The prostate gland is not identified. Other: There is moderate severity anasarca involving predominantly the posterolateral aspects of the abdominal and pelvic walls. No abdominopelvic ascites. Musculoskeletal: Marked severity multilevel degenerative changes seen throughout the lumbar spine. IMPRESSION: 1. 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 2. Marked severity right lower lobe consolidation, consistent with pneumonia. 3. Numerous bilateral renal and proximal ureteral calculi of various sizes. 4. Multiple right renal cysts (Bosniak 2). No additional follow-up or imaging is recommended. This recommendation follows ACR consensus guidelines: Management of the Incidental Renal Mass on CT: A White Paper of the ACR Incidental Findings Committee. J Am  Coll Radiol 2018;15:264-273. 5. Moderate severity anasarca. 6. Aortic atherosclerosis. Electronically Signed   By: Thaddeus  Houston M.D.   On: 05/08/2022 23:25   DG Abd 1 View  Result Date: 05/08/2022 CLINICAL DATA:  [id number] NG tube placement EXAM: ABDOMEN - 1 VIEW COMPARISON:  May 08, 2022 FINDINGS: Incomplete visualization of the abdomen laterally and the pelvis. Carina is not visualized, limiting evaluation. Enteric tube tip projects over the expected region of the proximal stomach. Side port projects over the region of the distal esophagus. Marked gaseous dilation of loops of bowel, similar in comparison to prior. IMPRESSION: 1. Enteric tube side port projects over the distal esophagus. 2. Similar appearance of diffuse gaseous dilation of loops of bowel, possibly ileus. If concern for obstruction, recommend CT. Electronically Signed   By: Stephanie  Peacock M.D.   On: 05/08/2022 12:08   DG Abd 1 View  Result Date: 05/08/2022 CLINICAL DATA:  Check gastric catheter placement EXAM: ABDOMEN - 1 VIEW COMPARISON:  Film from the previous day. FINDINGS: Gastric catheter is again noted in the stomach. Scattered large and small bowel gas is again seen and stable. IMPRESSION: Gastric catheter in the stomach. Electronically Signed   By: Mark  Lukens M.D.   On: 05/08/2022 00:32   DG Abd 1 View  Result Date: 05/07/2022 CLINICAL DATA:  Check gastric catheter placement EXAM: ABDOMEN - 1 VIEW COMPARISON:  None Available. FINDINGS: Gastric catheter is noted within the stomach. Scattered large and small bowel gas is again identified. This likely represents an ileus. Correlate with clinical exam. Degenerative changes of lumbar spine are noted. IMPRESSION: Gastric catheter in place. Considerable gaseous distension of the bowel which may represent an ileus. Electronically Signed   By: Mark  Lukens M.D.   On: 05/07/2022 19:32   DG Chest Port 1 View  Result Date: 05/06/2022 CLINICAL DATA:  Pneumonia EXAM: PORTABLE  CHEST 1 VIEW COMPARISON:  05/04/2022 FINDINGS: Normal cardiac silhouette. Low lung volumes. Fine airspace disease in the RIGHT upper lobe is slightly improved from comparison exam. Elevated LEFT hemidiaphragm. No acute osseous abnormality. IMPRESSION: 1. Slight improvement in RIGHT upper lobe pneumonia. 2. Low lung volumes. Electronically Signed   By: Stewart    Edmunds M.D.   On: 05/06/2022 09:29   DG Chest Port 1 View  Result Date: 05/04/2022 CLINICAL DATA:  Shortness of breath EXAM: PORTABLE CHEST 1 VIEW COMPARISON:  05/02/2022 FINDINGS: Patchy airspace disease in the right upper lobe. Heart is normal size. Aortic calcifications. No confluent opacity on the left. No effusions or acute bony abnormality. IMPRESSION: Patchy airspace opacity in the right upper lobe concerning for pneumonia. Electronically Signed   By: Kevin  Dover M.D.   On: 05/04/2022 21:01   US Venous Img Lower Bilateral (DVT)  Result Date: 05/04/2022 CLINICAL DATA:  81-year-old male with pulmonary embolism in lower extremity swelling. EXAM: BILATERAL LOWER EXTREMITY VENOUS DOPPLER ULTRASOUND TECHNIQUE: Gray-scale sonography with graded compression, as well as color Doppler and duplex ultrasound were performed to evaluate the lower extremity deep venous systems from the level of the common femoral vein and including the common femoral, femoral, profunda femoral, popliteal and calf veins including the posterior tibial, peroneal and gastrocnemius veins when visible. The superficial great saphenous vein was also interrogated. Spectral Doppler was utilized to evaluate flow at rest and with distal augmentation maneuvers in the common femoral, femoral and popliteal veins. COMPARISON:  01/10/2022 FINDINGS: RIGHT LOWER EXTREMITY Common Femoral Vein: Nonocclusive, heterogeneously isoechoic thrombus is present peripherally. The common femoral vein appears patent with normal respiratory variation centrally. Saphenofemoral Junction: Nonocclusive,  heterogeneously isoechoic thrombus is present. Profunda Femoral Vein: No evidence of thrombus. Normal compressibility and flow on color Doppler imaging. Femoral Vein: Nonocclusive, heterogeneously isoechoic thrombus is present throughout. Popliteal Vein: No evidence of thrombus. Normal compressibility, respiratory phasicity and response to augmentation. Calf Veins: No evidence of thrombus. Normal compressibility and flow on color Doppler imaging. Other Findings:  None. LEFT LOWER EXTREMITY Common Femoral Vein: No evidence of thrombus. Normal compressibility, respiratory phasicity and response to augmentation. Saphenofemoral Junction: No evidence of thrombus. Normal compressibility and flow on color Doppler imaging. Profunda Femoral Vein: No evidence of thrombus. Normal compressibility and flow on color Doppler imaging. Femoral Vein: No evidence of thrombus. Normal compressibility, respiratory phasicity and response to augmentation. Popliteal Vein: No evidence of thrombus. Normal compressibility, respiratory phasicity and response to augmentation. Calf Veins: No evidence of thrombus. Normal compressibility and flow on color Doppler imaging. Other Findings:  None. IMPRESSION: 1. Subacute appearing right lower extremity deep vein thrombosis extending from the peripheral common femoral vein through the peripheral femoral vein. No definite evidence of iliac extension. 2. No evidence of left lower extremity deep vein thrombosis. Dylan Suttle, MD [facility] Electronically Signed   By: Dylan  Suttle M.D.   On: 05/04/2022 08:08   CT HEAD WO CONTRAST (5MM)  Result Date: 05/04/2022 CLINICAL DATA:  Altered mental status, unequal pupils EXAM: CT HEAD WITHOUT CONTRAST TECHNIQUE: Contiguous axial images were obtained from the base of the skull through the vertex without intravenous contrast. RADIATION DOSE REDUCTION: This exam was performed according to the  departmental dose-optimization program which includes automated exposure control, adjustment of the mA and/or kV according to patient size and/or use of iterative reconstruction technique. COMPARISON:  01/10/2022 FINDINGS: Brain: No evidence of acute infarction, hemorrhage, mass, mass effect, or midline shift. No hydrocephalus or extra-axial fluid collection. Periventricular white matter changes, likely the sequela of chronic small vessel ischemic disease. Vascular: No hyperdense vessel. Atherosclerotic calcifications in the intracranial carotid and vertebral arteries. Skull: Normal. Negative for fracture or focal lesion. Sinuses/Orbits: No acute finding. Other: Fluid in the right mastoid air cells. IMPRESSION: No acute intracranial process. Electronically   Signed   By: Alison  Vasan M.D.   On: 05/04/2022 02:25   CT Angio Chest PE W and/or Wo Contrast  Result Date: 05/02/2022 CLINICAL DATA:  Pulmonary embolism (PE) suspected, high prob. Shortness of breath EXAM: CT ANGIOGRAPHY CHEST WITH CONTRAST TECHNIQUE: Multidetector CT imaging of the chest was performed using the standard protocol during bolus administration of intravenous contrast. Multiplanar CT image reconstructions and MIPs were obtained to evaluate the vascular anatomy. RADIATION DOSE REDUCTION: This exam was performed according to the departmental dose-optimization program which includes automated exposure control, adjustment of the mA and/or kV according to patient size and/or use of iterative reconstruction technique. CONTRAST:  75mL OMNIPAQUE IOHEXOL 350 MG/ML SOLN COMPARISON:  10/02/2012 FINDINGS: Cardiovascular: Filling defects within right lower lobe pulmonary arterial branches compatible with pulmonary emboli. No pulmonary emboli on the left. No evidence of right heart strain. Heart is normal size. Aorta normal caliber. Coronary artery and aortic calcifications. Mediastinum/Nodes: No mediastinal, hilar, or axillary adenopathy. Trachea and  esophagus are unremarkable. Thyroid unremarkable. Lungs/Pleura: Elevation of the right hemidiaphragm. Right basilar opacity could reflect atelectasis or early infarct. Left pendant atelectasis. No effusions. Upper Abdomen: Gaseous distention of bowel in the upper abdomen. Musculoskeletal: Chest wall soft tissues are unremarkable. No acute bony abnormality. Review of the MIP images confirms the above findings. IMPRESSION: Filling defects within right lower lobe pulmonary arterial branches compatible with pulmonary emboli. Associated right basilar airspace opacity could reflect atelectasis or infarcts. Elevation of the right hemidiaphragm. Coronary artery disease. Aortic Atherosclerosis (ICD10-I70.0). These results were called by telephone at the time of interpretation on 05/02/2022 at 11:47 pm to provider PATRICK ROBINSON , who verbally acknowledged these results. Electronically Signed   By: Kevin  Dover M.D.   On: 05/02/2022 23:48   DG Chest Port 1 View  Result Date: 05/02/2022 CLINICAL DATA:  Questionable sepsis - evaluate for abnormality. Shortness of breath EXAM: PORTABLE CHEST 1 VIEW COMPARISON:  01/10/2022 FINDINGS: Heart and mediastinal contours are within normal limits. No focal opacities or effusions. No acute bony abnormality. IMPRESSION: No active disease. Electronically Signed   By: Kevin  Dover M.D.   On: 05/02/2022 22:06      Assessment/Plan 1.  Pulmonary embolism  Due to the patient's upcoming surgery he will need to hold anticoagulation for at least 48 hours, and given the patient's current pulmonary embolism as well as deep vein thrombosis this places him at increased risk for worsening due to pulmonary embolism.  Based on this we will have an IVC filter placed for the patient.  Once the patient is able to resume anticoagulation this filter can be removed.  This could possibly plan while he is inpatient or even outpatient.  However the patient is unable to consent.  In discussing the case  with Dr. Cintron-Diaz, we will plan on having the patient undergo emergency consent.  I have discussed the procedure with the patient and he is agreeable to proceed.   Plan of care discussed with Dr.Dew and he is in agreement with plan noted above.   Family Communication:  Total Time: 80 minutes I spent 80 minutes in this encounter including personally reviewing extensive medical records, personally reviewing imaging studies and compared to prior scans, counseling the patient, placing orders, coordinating care and performing appropriate documentation  Thank you for allowing us to participate in the care of this patient.   Joneric Streight E Shariyah Eland, NP [facility] [phone] (Office Phone) [phone] (Office Fax) [phone] (Pager)  05/11/2022 11:35 AM    Staff may message me via secure chat in Epic  but this may not receive immediate response,  please page for urgent matters!  Dictation software was used to generate the above note. Typos may occur and escape review, as with typed/written notes. Any error is purely unintentional.  Please contact me directly for clarity if needed.    

## 2022-05-11 NOTE — Consult Note (Signed)
ANTICOAGULATION CONSULT NOTE  Pharmacy Consult for heparin Indication: pulmonary embolus and DVT  Allergies  Allergen Reactions   Penicillin G Rash and Other (See Comments)    Convulsions.    Has patient had a PCN reaction causing immediate rash, facial/tongue/throat swelling, SOB or lightheadedness with hypotension: not sure Has patient had a PCN reaction causing severe rash involving mucus membranes or skin necrosis: not sure Has patient had a PCN reaction that required hospitalization: not sure Has patient had a PCN reaction occurring within the last 10 years: not sure If all of the above answers are "NO", then may proceed with Cephalosporin use.    Patient Measurements: Height: 5\' 7"  (170.2 cm) Weight: 99.3 kg (218 lb 14.7 oz) IBW/kg (Calculated) : 66.1 Heparin Dosing Weight: 88.3 kg   Vital Signs: Temp: 98.5 F (36.9 C) (10/02 2305) BP: 145/77 (10/02 2305) Pulse Rate: 75 (10/02 2305)  Labs: Recent Labs    05/08/22 0434 05/09/22 0012 05/09/22 0557 05/10/22 0453 05/10/22 1451 05/10/22 1744 05/11/22 0145  HGB 11.4* 11.6*  --   --   --   --   --   HCT 35.7* 37.1*  --   --   --   --   --   PLT 271 272  --   --   --   --   --   APTT 41* 35  --   --   --  73* 117*  LABPROT  --  16.1*  --   --   --   --   --   INR  --  1.3*  --   --   --   --   --   HEPARINUNFRC 0.40  --  0.14*  --   --  >1.10*  --   CREATININE 1.79* 1.85*  --  1.23 1.11  --   --      Estimated Creatinine Clearance: 60.6 mL/min (by C-G formula based on SCr of 1.11 mg/dL).   Medical History: Past Medical History:  Diagnosis Date   Bipolar 1 disorder ([place])    C. difficile diarrhea 05/2016   history of ...   CHF (congestive heart failure) (HCC)    Chronic kidney disease 05/21/2016   acute renal failure with sepsis and uti   COPD (chronic obstructive pulmonary disease) (HCC)    DVT (deep venous thrombosis) (HCC)    GERD (gastroesophageal reflux disease)    Hematuria    High cholesterol     History of kidney stones    Hypertension    Penile erosion 2018   d/t frequent foley catheters   Schizophrenia ([place])    Seizure ([place])    on phenobarb   Urinary retention    frequently requiring foley placement   VRE (vancomycin resistant enterococcus) culture positive    urine    Medications:  Apixaban 10 mg (last dose: 10/2 @ 0240)  Assessment: 81 y.o. male with medical history significant of HTN, HLD, GERD, COPD, CHF (diastolic), seizure disorder presented to ED 05/02/22 with hypoxia and wheezing. CT chest showing PE as well as lower Doppler US showing subacute DVT. Patient has transitioned back and forth from apixaban and heparin a couple of times now, the first time due to NPO status, then returned to apixaban, and now back to heparin in anticipation of colonoscopy with possible biopsy, control of bleeding, and polypectomy, with additional interventions as necessary. Patient has received three doses of apixaban 10 mg, with the last dose on 10/2 @ 0842.  Hgb, Hct, and PLT stable.  Goal of Therapy:  Heparin level 0.3-0.7 units/ml once aPTT and heparin level correlate.  aPTT 66-102 seconds Monitor platelets by anticoagulation protocol: Yes  Date Time aPTT/HL Rate/Comment 10/2 1744 73/> 1.1 1500 units/hr.  10/3 0145 aPTT 117 Supratherapeutic       Plan: Decrease heparin infuson at 1350 units/hr.  Will recheck aPTT in 8 hours.  CBC with AM labs. Switch to heparin level once aPTT and heparin level correlate.   Otelia Sergeant, PharmD, [facility] 05/11/2022 2:37 AM

## 2022-05-12 ENCOUNTER — Encounter: Payer: Self-pay | Admitting: Vascular Surgery

## 2022-05-12 ENCOUNTER — Encounter
Admission: EM | Disposition: A | Discharge status: Skilled Nursing Facility | Payer: Self-pay | Source: Skilled Nursing Facility | Attending: Family Medicine

## 2022-05-12 ENCOUNTER — Other Ambulatory Visit: Payer: Self-pay

## 2022-05-12 ENCOUNTER — Inpatient Hospital Stay: Payer: Medicare Other | Admitting: Anesthesiology

## 2022-05-12 DIAGNOSIS — Z7189 Other specified counseling: Secondary | ICD-10-CM | POA: Diagnosis not present

## 2022-05-12 DIAGNOSIS — K562 Volvulus: Secondary | ICD-10-CM | POA: Clinically undetermined

## 2022-05-12 DIAGNOSIS — E876 Hypokalemia: Secondary | ICD-10-CM

## 2022-05-12 DIAGNOSIS — I2699 Other pulmonary embolism without acute cor pulmonale: Secondary | ICD-10-CM | POA: Diagnosis not present

## 2022-05-12 DIAGNOSIS — I5032 Chronic diastolic (congestive) heart failure: Secondary | ICD-10-CM

## 2022-05-12 DIAGNOSIS — Z515 Encounter for palliative care: Secondary | ICD-10-CM | POA: Diagnosis not present

## 2022-05-12 HISTORY — PX: LAPAROTOMY: SHX154

## 2022-05-12 HISTORY — PX: COLECTOMY WITH COLOSTOMY CREATION/HARTMANN PROCEDURE: SHX6598

## 2022-05-12 LAB — TYPE AND SCREEN
ABO/RH(D): O POS
Antibody Screen: NEGATIVE

## 2022-05-12 LAB — GLUCOSE, CAPILLARY
Glucose-Capillary: 109 mg/dL — ABNORMAL HIGH (ref 70–99)
Glucose-Capillary: 94 mg/dL (ref 70–99)
Glucose-Capillary: 103 mg/dL — ABNORMAL HIGH (ref 70–99)
Glucose-Capillary: 122 mg/dL — ABNORMAL HIGH (ref 70–99)
Glucose-Capillary: 146 mg/dL — ABNORMAL HIGH (ref 70–99)

## 2022-05-12 LAB — BASIC METABOLIC PANEL
Anion gap: <0 — ABNORMAL LOW (ref 5–15)
Anion gap: 4 — ABNORMAL LOW (ref 5–15)
BUN: 10 mg/dL (ref 8–23)
BUN: 8 mg/dL (ref 8–23)
CO2: 20 mmol/L — ABNORMAL LOW (ref 22–32)
CO2: 18 mmol/L — ABNORMAL LOW (ref 22–32)
Calcium: 8 mg/dL — ABNORMAL LOW (ref 8.9–10.3)
Calcium: 8.2 mg/dL — ABNORMAL LOW (ref 8.9–10.3)
Chloride: 127 mmol/L — ABNORMAL HIGH (ref 98–111)
Chloride: 123 mmol/L — ABNORMAL HIGH (ref 98–111)
Creatinine, Ser: 1.05 mg/dL (ref 0.61–1.24)
Creatinine, Ser: 1.18 mg/dL (ref 0.61–1.24)
GFR, Estimated: >60 mL/min (ref >60)
GFR, Estimated: >60 mL/min (ref >60)
Glucose, Bld: 125 mg/dL — ABNORMAL HIGH (ref 70–99)
Glucose, Bld: 173 mg/dL — ABNORMAL HIGH (ref 70–99)
Potassium: 2.8 mmol/L — ABNORMAL LOW (ref 3.5–5.1)
Potassium: 3 mmol/L — ABNORMAL LOW (ref 3.5–5.1)
Sodium: 146 mmol/L — ABNORMAL HIGH (ref 135–145)
Sodium: 145 mmol/L (ref 135–145)

## 2022-05-12 LAB — CBC
HCT: 32.6 % — ABNORMAL LOW (ref 39.0–52.0)
Hemoglobin: 10.4 g/dL — ABNORMAL LOW (ref 13.0–17.0)
MCH: 28 pg (ref 26.0–34.0)
MCHC: 31.9 g/dL (ref 30.0–36.0)
MCV: 87.6 fL (ref 80.0–100.0)
Platelets: 237 10*3/uL (ref 150–400)
RBC: 3.72 MIL/uL — ABNORMAL LOW (ref 4.22–5.81)
RDW: 16.2 % — ABNORMAL HIGH (ref 11.5–15.5)
WBC: 6.9 10*3/uL (ref 4.0–10.5)
nRBC: 0 % (ref 0.0–0.2)

## 2022-05-12 LAB — POTASSIUM: Potassium: 3.1 mmol/L — ABNORMAL LOW (ref 3.5–5.1)

## 2022-05-12 SURGERY — LAPAROTOMY, EXPLORATORY
Anesthesia: General | Site: Abdomen

## 2022-05-12 MED ORDER — ACETAMINOPHEN 10 MG/ML IV SOLN
INTRAVENOUS | Status: AC
  Filled 2022-05-12: qty 100

## 2022-05-12 MED ORDER — CARVEDILOL 25 MG PO TABS: 25.0000 mg | ORAL_TABLET | Freq: Two times a day (BID) | ORAL | Status: DC

## 2022-05-12 MED ORDER — POTASSIUM CHLORIDE 10 MEQ/100ML IV SOLN
10.0000 meq | INTRAVENOUS | Status: DC
  Filled 2022-05-12 (×6): qty 100

## 2022-05-12 MED ORDER — PRAVASTATIN SODIUM 20 MG PO TABS
10.0000 mg | ORAL_TABLET | Freq: Every day | ORAL | Status: DC
  Administered 2022-05-13 – 2022-05-16 (×5): 10 mg via ORAL
  Filled 2022-05-12 (×5): qty 1

## 2022-05-12 MED ORDER — LORATADINE 10 MG PO TABS
10.0000 mg | ORAL_TABLET | Freq: Every day | ORAL | Status: DC
  Administered 2022-05-13 – 2022-05-17 (×5): 10 mg via ORAL
  Filled 2022-05-12 (×5): qty 1

## 2022-05-12 MED ORDER — VITAMIN C 500 MG PO TABS
500.0000 mg | ORAL_TABLET | Freq: Two times a day (BID) | ORAL | Status: DC
  Administered 2022-05-13 – 2022-05-17 (×10): 500 mg via ORAL
  Filled 2022-05-12 (×10): qty 1

## 2022-05-12 MED ORDER — ARIPIPRAZOLE 10 MG PO TABS
20.0000 mg | ORAL_TABLET | Freq: Every day | ORAL | Status: DC
  Administered 2022-05-13 – 2022-05-17 (×5): 20 mg via ORAL
  Filled 2022-05-12 (×5): qty 2

## 2022-05-12 MED ORDER — BUPROPION HCL 75 MG PO TABS: 150.0000 mg | ORAL_TABLET | Freq: Two times a day (BID) | ORAL | Status: DC

## 2022-05-12 MED ORDER — PROPOFOL 10 MG/ML IV BOLUS
INTRAVENOUS | Status: AC
  Filled 2022-05-12: qty 20

## 2022-05-12 MED ORDER — PHENYLEPHRINE 80 MCG/ML (10ML) SYRINGE FOR IV PUSH (FOR BLOOD PRESSURE SUPPORT): PREFILLED_SYRINGE | INTRAVENOUS | Status: DC | PRN

## 2022-05-12 MED ORDER — DEXTROMETHORPHAN POLISTIREX ER 30 MG/5ML PO SUER: 5.0000 mL | Freq: Two times a day (BID) | ORAL | Status: DC

## 2022-05-12 MED ORDER — POTASSIUM CHLORIDE 20 MEQ PO PACK: 40.0000 meq | PACK | Freq: Two times a day (BID) | ORAL | Status: DC

## 2022-05-12 MED ORDER — SUCCINYLCHOLINE CHLORIDE 200 MG/10ML IV SOSY
PREFILLED_SYRINGE | INTRAVENOUS | Status: DC | PRN
  Administered 2022-05-12: 100 mg via INTRAVENOUS

## 2022-05-12 MED ORDER — VANCOMYCIN HCL 1000 MG IV SOLR
INTRAVENOUS | Status: DC | PRN
  Administered 2022-05-12: 1000 mg via INTRAVENOUS

## 2022-05-12 MED ORDER — MIDAZOLAM HCL 2 MG/2ML IJ SOLN
INTRAMUSCULAR | Status: DC | PRN
  Administered 2022-05-12 (×2): 1 mg via INTRAVENOUS

## 2022-05-12 MED ORDER — POTASSIUM CHLORIDE 20 MEQ PO PACK
40.0000 meq | PACK | Freq: Two times a day (BID) | ORAL | Status: DC
  Administered 2022-05-13 (×3): 40 meq via ORAL
  Filled 2022-05-12 (×3): qty 2

## 2022-05-12 MED ORDER — PHENYLEPHRINE HCL-NACL 20-0.9 MG/250ML-% IV SOLN
INTRAVENOUS | Status: DC | PRN
  Administered 2022-05-12: 20 ug/min via INTRAVENOUS

## 2022-05-12 MED ORDER — SODIUM CHLORIDE (PF) 0.9 % IJ SOLN
INTRAMUSCULAR | Status: AC
  Filled 2022-05-12: qty 50

## 2022-05-12 MED ORDER — PHENYLEPHRINE HCL (PRESSORS) 10 MG/ML IV SOLN
INTRAVENOUS | Status: DC | PRN
  Administered 2022-05-12 (×5): 80 ug via INTRAVENOUS

## 2022-05-12 MED ORDER — VANCOMYCIN HCL IN DEXTROSE 1-5 GM/200ML-% IV SOLN
INTRAVENOUS | Status: AC
  Filled 2022-05-12: qty 200

## 2022-05-12 MED ORDER — BUPIVACAINE LIPOSOME 1.3 % IJ SUSP
INTRAMUSCULAR | Status: AC
  Filled 2022-05-12: qty 20

## 2022-05-12 MED ORDER — POTASSIUM CHLORIDE 20 MEQ PO PACK: 60.0000 meq | PACK | Freq: Once | ORAL | Status: DC

## 2022-05-12 MED ORDER — POTASSIUM CHLORIDE 10 MEQ/100ML IV SOLN
10.0000 meq | INTRAVENOUS | Status: AC
  Filled 2022-05-12 (×3): qty 100

## 2022-05-12 MED ORDER — POLYETHYLENE GLYCOL 3350 17 G PO PACK: 17.0000 g | PACK | Freq: Every day | ORAL | Status: DC | PRN

## 2022-05-12 MED ORDER — ACETAMINOPHEN 650 MG RE SUPP: 650.0000 mg | Freq: Four times a day (QID) | RECTAL | Status: DC | PRN

## 2022-05-12 MED ORDER — LACTULOSE 10 GM/15ML PO SOLN: 10.0000 g | Freq: Four times a day (QID) | ORAL | Status: DC | PRN

## 2022-05-12 MED ORDER — PRAVASTATIN SODIUM 20 MG PO TABS: 10.0000 mg | ORAL_TABLET | Freq: Every day | ORAL | Status: DC

## 2022-05-12 MED ORDER — ACETAMINOPHEN 325 MG PO TABS: 650.0000 mg | ORAL_TABLET | Freq: Four times a day (QID) | ORAL | Status: DC | PRN

## 2022-05-12 MED ORDER — FENTANYL CITRATE (PF) 100 MCG/2ML IJ SOLN
INTRAMUSCULAR | Status: DC | PRN
  Administered 2022-05-12 (×2): 50 ug via INTRAVENOUS

## 2022-05-12 MED ORDER — ADULT MULTIVITAMIN W/MINERALS CH
1.0000 | ORAL_TABLET | Freq: Every day | ORAL | Status: DC
  Administered 2022-05-13 – 2022-05-17 (×5): 1 via ORAL
  Filled 2022-05-12 (×5): qty 1

## 2022-05-12 MED ORDER — OXYCODONE HCL 5 MG/5ML PO SOLN: 5.0000 mg | Freq: Once | ORAL | Status: DC | PRN

## 2022-05-12 MED ORDER — LIDOCAINE HCL (PF) 2 % IJ SOLN
INTRAMUSCULAR | Status: AC
  Filled 2022-05-12: qty 5

## 2022-05-12 MED ORDER — ONDANSETRON HCL 4 MG/2ML IJ SOLN
INTRAMUSCULAR | Status: AC
  Filled 2022-05-12: qty 2

## 2022-05-12 MED ORDER — LIDOCAINE HCL (CARDIAC) PF 100 MG/5ML IV SOSY
PREFILLED_SYRINGE | INTRAVENOUS | Status: DC | PRN
  Administered 2022-05-12: 100 mg via INTRAVENOUS

## 2022-05-12 MED ORDER — HYDROCODONE-ACETAMINOPHEN 5-325 MG PO TABS
1.0000 | ORAL_TABLET | ORAL | Status: DC | PRN
  Administered 2022-05-13 – 2022-05-14 (×3): 1 via ORAL
  Administered 2022-05-16 – 2022-05-17 (×2): 2 via ORAL
  Filled 2022-05-12 (×2): qty 2
  Filled 2022-05-12 (×2): qty 1
  Filled 2022-05-12: qty 2
  Filled 2022-05-12: qty 1

## 2022-05-12 MED ORDER — POTASSIUM CHLORIDE 10 MEQ/100ML IV SOLN
10.0000 meq | INTRAVENOUS | Status: AC
  Administered 2022-05-12 (×2): 10 meq via INTRAVENOUS
  Filled 2022-05-12 (×2): qty 100

## 2022-05-12 MED ORDER — GUAIFENESIN 100 MG/5ML PO LIQD: 5.0000 mL | ORAL | Status: DC | PRN

## 2022-05-12 MED ORDER — MIDAZOLAM HCL 2 MG/2ML IJ SOLN
INTRAMUSCULAR | Status: AC
  Filled 2022-05-12: qty 2

## 2022-05-12 MED ORDER — FERROUS SULFATE 220 (44 FE) MG/5ML PO ELIX
325.0000 mg | ORAL_SOLUTION | Freq: Every day | ORAL | Status: DC
  Administered 2022-05-13 – 2022-05-17 (×5): 325 mg via ORAL
  Filled 2022-05-12 (×3): qty 7.39
  Filled 2022-05-12 (×2): qty 7.4

## 2022-05-12 MED ORDER — ROCURONIUM BROMIDE 100 MG/10ML IV SOLN
INTRAVENOUS | Status: DC | PRN
  Administered 2022-05-12: 20 mg via INTRAVENOUS
  Administered 2022-05-12: 30 mg via INTRAVENOUS

## 2022-05-12 MED ORDER — SODIUM CHLORIDE (PF) 0.9 % IJ SOLN
INTRAMUSCULAR | Status: DC | PRN
  Administered 2022-05-12: 100 mL

## 2022-05-12 MED ORDER — FOLIC ACID 1 MG PO TABS
1.0000 mg | ORAL_TABLET | Freq: Every morning | ORAL | Status: DC
  Administered 2022-05-13 – 2022-05-17 (×5): 1 mg via ORAL
  Filled 2022-05-12 (×5): qty 1

## 2022-05-12 MED ORDER — HYDRALAZINE HCL 20 MG/ML IJ SOLN
10.0000 mg | Freq: Four times a day (QID) | INTRAMUSCULAR | Status: DC | PRN
  Administered 2022-05-12: 10 mg via INTRAVENOUS
  Filled 2022-05-12: qty 1

## 2022-05-12 MED ORDER — POTASSIUM CHLORIDE 10 MEQ/100ML IV SOLN
10.0000 meq | INTRAVENOUS | Status: AC
  Administered 2022-05-12 (×2): 10 meq via INTRAVENOUS
  Filled 2022-05-12 (×3): qty 100

## 2022-05-12 MED ORDER — CARVEDILOL 25 MG PO TABS
25.0000 mg | ORAL_TABLET | Freq: Two times a day (BID) | ORAL | Status: DC
  Administered 2022-05-13 – 2022-05-17 (×10): 25 mg via ORAL
  Filled 2022-05-12 (×10): qty 1

## 2022-05-12 MED ORDER — VITAMIN D 25 MCG (1000 UNIT) PO TABS
1000.0000 [IU] | ORAL_TABLET | Freq: Every morning | ORAL | Status: DC
  Administered 2022-05-13 – 2022-05-17 (×5): 1000 [IU] via ORAL
  Filled 2022-05-12 (×5): qty 1

## 2022-05-12 MED ORDER — ACETAMINOPHEN 10 MG/ML IV SOLN
INTRAVENOUS | Status: DC | PRN
  Administered 2022-05-12: 1000 mg via INTRAVENOUS

## 2022-05-12 MED ORDER — OXYCODONE HCL 5 MG PO TABS: 5.0000 mg | ORAL_TABLET | Freq: Once | ORAL | Status: DC | PRN

## 2022-05-12 MED ORDER — ONDANSETRON HCL 4 MG/2ML IJ SOLN
INTRAMUSCULAR | Status: DC | PRN
  Administered 2022-05-12: 4 mg via INTRAVENOUS

## 2022-05-12 MED ORDER — DEXMEDETOMIDINE HCL IN NACL 200 MCG/50ML IV SOLN
INTRAVENOUS | Status: DC | PRN
  Administered 2022-05-12: 8 ug via INTRAVENOUS

## 2022-05-12 MED ORDER — SUGAMMADEX SODIUM 200 MG/2ML IV SOLN
INTRAVENOUS | Status: DC | PRN
  Administered 2022-05-12: 200 mg via INTRAVENOUS

## 2022-05-12 MED ORDER — POTASSIUM CHLORIDE 10 MEQ/100ML IV SOLN
10.0000 meq | INTRAVENOUS | Status: DC
  Filled 2022-05-12 (×2): qty 100

## 2022-05-12 MED ORDER — VITAMIN C 500 MG PO TABS: 500.0000 mg | ORAL_TABLET | Freq: Two times a day (BID) | ORAL | Status: DC

## 2022-05-12 MED ORDER — DEXAMETHASONE SODIUM PHOSPHATE 10 MG/ML IJ SOLN
INTRAMUSCULAR | Status: DC | PRN
  Administered 2022-05-12: 10 mg via INTRAVENOUS

## 2022-05-12 MED ORDER — BUPROPION HCL 75 MG PO TABS
150.0000 mg | ORAL_TABLET | Freq: Two times a day (BID) | ORAL | Status: DC
  Administered 2022-05-13 – 2022-05-17 (×10): 150 mg via ORAL
  Filled 2022-05-12 (×10): qty 2

## 2022-05-12 MED ORDER — FENTANYL CITRATE (PF) 100 MCG/2ML IJ SOLN
INTRAMUSCULAR | Status: AC
  Filled 2022-05-12: qty 2

## 2022-05-12 MED ORDER — FENTANYL CITRATE (PF) 100 MCG/2ML IJ SOLN: 25.0000 ug | INTRAMUSCULAR | Status: DC | PRN

## 2022-05-12 MED ORDER — BUPIVACAINE-EPINEPHRINE (PF) 0.5% -1:200000 IJ SOLN
INTRAMUSCULAR | Status: AC
  Filled 2022-05-12: qty 30

## 2022-05-12 MED ORDER — PROPOFOL 10 MG/ML IV BOLUS
INTRAVENOUS | Status: DC | PRN
  Administered 2022-05-12: 100 mg via INTRAVENOUS

## 2022-05-12 MED ORDER — DEXTROMETHORPHAN POLISTIREX ER 30 MG/5ML PO SUER
5.0000 mL | Freq: Two times a day (BID) | ORAL | Status: DC
  Administered 2022-05-13 (×2): 30 mg via ORAL
  Filled 2022-05-12 (×2): qty 5

## 2022-05-12 SURGICAL SUPPLY — 67 items
APL PRP STRL LF DISP 70% ISPRP (MISCELLANEOUS) ×1
BLADE SURG 15 STRL LF DISP TIS (BLADE) ×1 IMPLANT
BLADE SURG 15 STRL SS (BLADE) ×1
CHLORAPREP W/TINT 26 (MISCELLANEOUS) ×1 IMPLANT
CLIP TI LARGE 6 (CLIP) IMPLANT
CLIP TI MEDIUM 6 (CLIP) IMPLANT
DRAIN PENROSE 5/8X18 LTX STRL (DRAIN) IMPLANT
DRAPE INCISE IOBAN 66X60 STRL (DRAPES) IMPLANT
DRAPE LAPAROTOMY 100X77 ABD (DRAPES) ×1 IMPLANT
DRAPE LEGGINS SURG 28X43 STRL (DRAPES) IMPLANT
DRAPE UNDER BUTTOCK W/FLU (DRAPES) IMPLANT
DRSG OPSITE POSTOP 4X10 (GAUZE/BANDAGES/DRESSINGS) IMPLANT
DRSG OPSITE POSTOP 4X8 (GAUZE/BANDAGES/DRESSINGS) IMPLANT
ELECT REM PT RETURN 9FT ADLT (ELECTROSURGICAL) ×1
ELECTRODE REM PT RTRN 9FT ADLT (ELECTROSURGICAL) ×1 IMPLANT
GAUZE 4X4 16PLY ~~LOC~~+RFID DBL (SPONGE) ×1 IMPLANT
GLOVE BIO SURGEON STRL SZ 6.5 (GLOVE) ×2 IMPLANT
GLOVE BIOGEL PI IND STRL 6.5 (GLOVE) ×2 IMPLANT
GOWN STRL REUS W/ TWL LRG LVL3 (GOWN DISPOSABLE) ×6 IMPLANT
GOWN STRL REUS W/TWL LRG LVL3 (GOWN DISPOSABLE) ×6
HOLDER FOLEY CATH W/STRAP (MISCELLANEOUS) ×1 IMPLANT
KIT TURNOVER KIT A (KITS) ×1 IMPLANT
LABEL OR SOLS (LABEL) ×1 IMPLANT
LIGASURE IMPACT 36 18CM CVD LR (INSTRUMENTS) ×1 IMPLANT
MANIFOLD NEPTUNE II (INSTRUMENTS) ×1 IMPLANT
NEEDLE HYPO 22GX1.5 SAFETY (NEEDLE) ×2 IMPLANT
NS IRRIG 1000ML POUR BTL (IV SOLUTION) ×1 IMPLANT
PACK BASIN MAJOR ARMC (MISCELLANEOUS) ×1 IMPLANT
PACK COLON CLEAN CLOSURE (MISCELLANEOUS) ×1 IMPLANT
PAD PREP 24X41 OB/GYN DISP (PERSONAL CARE ITEMS) IMPLANT
RELOAD LINEAR CUT PROX 55 BLUE (ENDOMECHANICALS) IMPLANT
RELOAD PROXIMATE 75MM BLUE (ENDOMECHANICALS) ×2 IMPLANT
RELOAD PROXIMATE 75MM GREEN (ENDOMECHANICALS) ×2 IMPLANT
RELOAD STAPLE 55 3.8 BLU REG (ENDOMECHANICALS) IMPLANT
RELOAD STAPLE 75 3.8 BLU REG (ENDOMECHANICALS) IMPLANT
RELOAD STAPLE 75 4.5 GRN THCK (ENDOMECHANICALS) IMPLANT
RELOAD STAPLER LINEAR PROX 30 (STAPLE) IMPLANT
RETRACTOR WOUND ALXS 18CM MED (MISCELLANEOUS) IMPLANT
RTRCTR WOUND ALEXIS O 18CM MED (MISCELLANEOUS) ×1
SOL PREP PVP 2OZ (MISCELLANEOUS) ×1
SOLUTION PREP PVP 2OZ (MISCELLANEOUS) ×1 IMPLANT
SPONGE DRAIN TRACH 4X4 STRL 2S (GAUZE/BANDAGES/DRESSINGS) IMPLANT
SPONGE T-LAP 18X18 ~~LOC~~+RFID (SPONGE) ×3 IMPLANT
STAPLER GUN LINEAR PROX 60 (STAPLE) IMPLANT
STAPLER PROXIMATE 55 BLUE (STAPLE) IMPLANT
STAPLER PROXIMATE 75MM BLUE (STAPLE) IMPLANT
STAPLER RELOAD LINEAR PROX 30 (STAPLE)
STAPLER RELOADABLE 30 BLU REG (STAPLE) IMPLANT
STAPLER SKIN PROX 35W (STAPLE) ×1 IMPLANT
SURGILUBE 2OZ TUBE FLIPTOP (MISCELLANEOUS) ×1 IMPLANT
SUT ETHILON 4-0 (SUTURE)
SUT ETHILON 4-0 FS2 18XMFL BLK (SUTURE)
SUT NYLON 2-0 (SUTURE) IMPLANT
SUT PDS AB 0 CT1 27 (SUTURE) ×2 IMPLANT
SUT PDS AB 1 TP1 54 (SUTURE) ×2 IMPLANT
SUT SILK 3-0 (SUTURE) ×1 IMPLANT
SUT STRATAFIX PDS 30 CT-1 (SUTURE) IMPLANT
SUT VIC AB 3-0 SH 27 (SUTURE) ×2
SUT VIC AB 3-0 SH 27X BRD (SUTURE) ×2 IMPLANT
SUT VICRYL 3-0 RB1 18 ABS (SUTURE) IMPLANT
SUT VLOC 90 2/L VL 12 GS22 (SUTURE) IMPLANT
SUT VLOC 90 S/L VL9 GS22 (SUTURE) IMPLANT
SUTURE ETHLN 4-0 FS2 18XMF BLK (SUTURE) IMPLANT
SYR 20ML LL LF (SYRINGE) ×2 IMPLANT
TRAP FLUID SMOKE EVACUATOR (MISCELLANEOUS) ×1 IMPLANT
TRAY FOLEY MTR SLVR 16FR STAT (SET/KITS/TRAYS/PACK) ×1 IMPLANT
WATER STERILE IRR 500ML POUR (IV SOLUTION) ×1 IMPLANT

## 2022-05-12 NOTE — Progress Notes (Signed)
Patient ID: [name], male   DOB: 01-03-1941, 81 y.o.   MRN: [id number]     [facility] Day(s): 10.   Interval History: Patient seen and examined, no acute events or new complaints overnight.  Denies abdominal pain.  Denies any complaint  Vital signs in last 24 hours: [min-max] current  Temp:  [97.4 F (36.3 C)-98.6 F (37 C)] 97.4 F (36.3 C) (10/04 1110) Pulse Rate:  [0-77] 66 (10/04 1110) Resp:  [13-20] 18 (10/04 1110) BP: (131-162)/(67-94) 152/80 (10/04 1110) SpO2:  [97 %-100 %] 100 % (10/04 1110)     Height: 5\' 7"  (170.2 cm) Weight: 97 kg BMI (Calculated): 33.49   Physical Exam:  Constitutional: alert, cooperative  Respiratory: breathing non-labored at rest  Cardiovascular: regular rate and sinus rhythm  Gastrointestinal: soft, non-tender, and distended  Labs:     Latest Ref Rng & Units 05/12/2022    4:41 AM 05/11/2022    4:56 AM 05/09/2022   12:12 AM  CBC  WBC 4.0 - 10.5 K/uL 6.9  7.1  8.8   Hemoglobin 13.0 - 17.0 g/dL 10.4  10.8  11.6   Hematocrit 39.0 - 52.0 % 32.6  33.8  37.1   Platelets 150 - 400 K/uL 237  243  272       Latest Ref Rng & Units 05/12/2022   10:48 AM 05/12/2022    4:41 AM 05/11/2022    4:39 PM  CMP  Glucose 70 - 99 mg/dL  125    BUN 8 - 23 mg/dL  10    Creatinine 0.61 - 1.24 mg/dL  1.05    Sodium 135 - 145 mmol/L  146    Potassium 3.5 - 5.1 mmol/L 3.1  2.8  2.9   Chloride 98 - 111 mmol/L  127    CO2 22 - 32 mmol/L  20    Calcium 8.9 - 10.3 mg/dL  8.0      Imaging studies: No new pertinent imaging studies   Assessment/Plan:  81 y.o. male with single problems, complicated by pertinent comorbidities including acute pulmonary embolism on heparin drip, aspiration pneumonia on antibiotic therapy, diastolic CHF, seizure disorder, hypokalemia, COPD.   Sigmoid volvulus -S/p endoscopic decompression with rectal tube placement on 05/09/2022 -No clinical duration. -X-ray yesterday shows recurrence of volvulus.  Abdominal  physical exam without severe distention. -I again explained the patient about the need of partial colectomy and colostomy creation.  He seems to understand that he has had issues with the colon but have not seen that he is really understanding what does not mean and what this means to have a colostomy. -As per palliative care service and ethics committee patient is not capable of making decision and he has been decided that if 2 physicians assess the patient needs any surgical intervention to help patient and we are acting good faith we should proceed with the recommended treatment.  In my opinion patient will benefit of partial colectomy with end colostomy creation that will be he will be able to be fed and avoid perforation.  That he is mostly confined to the bed a colostomy will be easier to take care of him. -Case has been discussed with hospitalist, palliative care service, social worker and ethics committee -We will proceed with partial colectomy with end colostomy creation.   Arnold Long, MD

## 2022-05-12 NOTE — TOC Progression Note (Signed)
Transition of Care [facility]) - Progression Note    Patient Details  Name: [name] MRN: [id number] Date of Birth: 01-29-1941  Transition of Care [facility]) CM/SW [place], [place] Phone Number: 05/12/2022, 11:13 AM  Clinical Narrative:     CSW has emailed [place] Moos with DSS as there has been no response to voicemails that have been left. CSW has been requesting guardianship process be started for he near future should patient become unable to make his own decisions, has he has no family or contacts listed.   Expected Discharge Plan: [place] Barriers to Discharge: Continued Medical Work up  Expected Discharge Plan and Services Expected Discharge Plan: [place] Acute Care Choice: Resumption of Svcs/PTA Provider Living arrangements for the past 2 months: [place]                                       Social Determinants of Health (SDOH) Interventions    Readmission Risk Interventions    05/04/2022   12:15 PM  Readmission Risk Prevention Plan  Transportation Screening Complete  Medication Review (RN Care Manager) Complete  PCP or Specialist appointment within 3-5 days of discharge Complete  SW Recovery Care/Counseling Consult Complete  [place] Complete

## 2022-05-12 NOTE — Transfer of Care (Signed)
Immediate Anesthesia Transfer of Care Note  Patient: MAGGIE DWORKIN  Procedure(s) Performed: EXPLORATORY LAPAROTOMY (Abdomen) PARTIAL COLECTOMY WITH COLOSTOMY CREATION/HARTMANN PROCEDURE (Abdomen)  Patient Location: PACU  Anesthesia Type:General  Level of Consciousness: drowsy and patient cooperative  Airway & Oxygen Therapy: Patient Spontanous Breathing and Patient connected to face mask oxygen  Post-op Assessment: Report given to RN and Post -op Vital signs reviewed and stable  Post vital signs: Reviewed and stable  Last Vitals:  Vitals Value Taken Time  BP 152/74 05/12/22 1446  Temp    Pulse 58 05/12/22 1448  Resp 15 05/12/22 1448  SpO2 92 % 05/12/22 1448  Vitals shown include unvalidated device data.  Last Pain:  Vitals:   05/12/22 1154  TempSrc: Oral  PainSc: 0-No pain      Patients Stated Pain Goal: 0 (76/28/31 5176)  Complications: No notable events documented.

## 2022-05-12 NOTE — Anesthesia Procedure Notes (Signed)
Procedure Name: Intubation Date/Time: 05/12/2022 1:46 PM  Performed by: Biagio Borg, CRNAPre-anesthesia Checklist: Patient identified, Emergency Drugs available, Suction available and Patient being monitored Patient Re-evaluated:Patient Re-evaluated prior to induction Oxygen Delivery Method: Circle system utilized Preoxygenation: Pre-oxygenation with 100% oxygen Induction Type: IV induction Ventilation: Mask ventilation without difficulty Laryngoscope Size: McGraph and 4 Grade View: Grade II Tube type: Oral Tube size: 7.0 mm Number of attempts: 1 Airway Equipment and Method: Stylet Placement Confirmation: ETT inserted through vocal cords under direct vision, positive ETCO2 and breath sounds checked- equal and bilateral Tube secured with: Tape Dental Injury: Teeth and Oropharynx as per pre-operative assessment

## 2022-05-12 NOTE — Consult Note (Signed)
[place] for Electrolyte Monitoring and Replacement   Recent Labs: Potassium (mmol/L)  Date Value  05/12/2022 3.0 (L)  10/03/2012 3.1 (L)   Magnesium (mg/dL)  Date Value  05/11/2022 2.0  10/02/2012 1.3 (L)   Calcium (mg/dL)  Date Value  05/12/2022 8.2 (L)   Calcium, Total (mg/dL)  Date Value  10/03/2012 8.2 (L)   Albumin (g/dL)  Date Value  05/09/2022 2.7 (L)  10/02/2012 3.4   Phosphorus (mg/dL)  Date Value  05/11/2022 3.0   Sodium (mmol/L)  Date Value  05/12/2022 145  10/03/2012 141   Assessment: 81 y.o. male with medical history significant of HTN, HLD, GERD, COPD, CHF (diastolic), seizure disorder, BPH who comes in today with presumed COPD exacerbation. Found to have a PE, started on heparin infusion. Potassium continues to be low.  Diet: NPO, has NGT in place MIVF: D5 1/2 NS + 40 mEq K/L at 125 cc/hr (120 mEq K+/day)  AKI resolved  K 2.9>>2.8 after receiving Kcl 40 mEq per tube x 1 dose and Kcl 10 mEq IV x 4 doses in addition to change to increasing potassium in MIVF to D5 1/2 NS + 40 mEq K/L at 125 cc/hr (120 mEq K+/day)  Goal of Therapy:  Within normal limits  Plan: Will give another Kcl 10 mEq x 2 and start KCL per tube 40 mEq BID.  Follow-up electrolytes tomorrow with AM labs  Eleonore Chiquito, PharmD, BCPS 05/12/2022 8:06 PM

## 2022-05-12 NOTE — Progress Notes (Signed)
PROGRESS NOTE    [name]  TDD:[id number] DOB: 1941/06/03 DOA: 05/02/2022 PCP: Rosetta Posner, MD   Brief Narrative:  This 81 y.o. male with medical history significant of HTN, HLD, GERD, COPD, CHF (diastolic), seizure disorder, BPH who comes in ED with presumed COPD exacerbation.  Per the EDP he was wheezing on admission and hypoxic.  EMS had to put him on CPAP to get his oxygen saturations up.  His blood gases were essentially normal.  He was weaned down to nasal cannula. CT chest with contrast Filling defects within right lower lobe pulmonary arterial branches compatible with pulmonary emboli.  Patient was admitted for acute pulmonary embolism, started on IV heparin.  Patient has significant risk of aspiration.  Speech and swallow recommended n.p.o. with alternate means of feeding.  Palliative care consulted to discuss goals of care.  Patient has no capacity to make decision.  No alternate decision-maker has been identified at this time.  Patient has developed sigmoid volvulus overnight.  General surgery and GI consulted.  Patient underwent emergent colonoscopy with decompression of the volvulus.  Definitive treatment would be surgical correction.  Patient is at high risk for volvulus recurrence.  Decompression provides time but not a definite treatment.  Ethics committee consulted to make a determination since patient has no next of kin.  Psychiatry consulted patient does have a capacity to make decisions.  Patient is agreeable for surgical intervention.  Patient underwent IVC filter for pulmonary embolism , since patient needs to be off anticoagulation for 48 hours.  Patient is scheduled for surgical intervention tomorrow.  Assessment & Plan:   Principal Problem:   Pulmonary embolism (HCC) Active Problems:   UTI (urinary tract infection)   Chronic diastolic CHF (congestive heart failure) (HCC)   Seizure disorder (HCC)   Depression   Hypertension   Mixed hyperlipidemia   BPH (benign  prostatic hyperplasia)   Normocytic anemia   Hypokalemia   DVT of lower extremity, bilateral (HCC)   Venous insufficiency of both lower extremities   COPD exacerbation (HCC)   Malnutrition of moderate degree   Acute deep vein thrombosis (DVT) of left lower extremity (HCC)   Acute respiratory failure with hypoxia (HCC)  Acute pulmonary embolism / Acute Right LE DVT:  Patient found to have acute pulmonary embolism and acute Right LE DVT He was started on heparin gtt. Likely primary cause for his respiratory distress and oxygen requirements. Continue supplemental oxygen as needed. Patient underwent IVC filter placement 10/3.  UTI in the setting of chronic suprapubic catheter: Patient has indwelling suprapubic catheter and urine had a lot of sediment in it. Suprapubic catheter was changed  (05/03/22) It was changed on August 25th 2023 at urology office Remains afebrile without leukocytosis.    Aspiration pneumonia: Completed antibiotics for aspiration pneumonia. Chest x-ray shows improving infiltrate. NG tube inserted for medications and for feeding.  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   General surgery and GI consulted.   Patient underwent emergent colonoscopy with decompression of the volvulus with rectal tube.   Definitive treatment would be surgical correction.   Patient is at high risk for volvulus recurrence.  Decompression provides time but not a definite treatment.   Ethics committee consulted to make a determination since patient has no next of kin. General surgery recommended surgical intervention, planned for today 10/4. Psych consulted.  Patient does have capacity to make decisions.   Chronic diastolic CHF: Lisinopril, carvedilol and Lasix on hold because of low blood pressure.  BNP is slightly elevated but chest x-ray does not show fluid overload.   Seizure disorder (HCC) Continue phenobarbital.   Depression Continue Wellbutrin,  Abilify.  Hypokalemia: Replacement in progress.  Continue to monitor   Hypertension Hold lisinopril, carvedilol because of low blood pressure.   Mixed hyperlipidemia Continue Pravachol.   BPH (benign prostatic hyperplasia) Continue Proscar.   Normocytic anemia Hemoglobin is stable.  Monitor CBC   COPD: Currently no wheezing. Continue inhalers.   Venous insufficiency of both lower extremities Continue Lasix as needed   Dysphagia with high aspiration risk: Speech and swallow evaluation completed, high risk for aspiration recommended NPO. Completed course of antibiotics for aspiration pneumonia Palliative care consulted to discuss goals of care, alternate means of feeding. Continue NG tube for feeding and for medications  Obesity: Estimated body mass index is 33.49 kg/m as calculated from the following:   Height as of this encounter: 5\' 7"  (1.702 m).   Weight as of this encounter: 97 kg.      DVT prophylaxis: Eliquis Code Status: Full code Family Communication: No family at bedside Disposition Plan:   Status is: Inpatient Remains inpatient appropriate because: Admitted for acute PE, acute right LE DVT started on Eliquis.   Patient has no capacity to make decision.  No alternate decision-maker has been identified at this time. Patient has developed sigmoid volvulus, and remains high risk for volvulus recurrence.  Decompression provides time but not a definite treatment.  Ethics committee consulted to make a determination since patient has no next of kin.  Undergoing exploratory laparotomy with colostomy creation today.  Patient is not medically clear.   Consultants:  General surgery Palliative care Vascular surgery for IVC filter placement  Procedures:    Subjective: Patient was sleeping sitting up in bed, woke easily to voice.  Has NG tube and rectal tube and Foley in place.  When I asked if he was having surgery today he said he did not know but agreeable if  we feels he needs it.  Denies abdominal pain or nausea. Objective: Vitals:   05/12/22 0445 05/12/22 0837 05/12/22 1110 05/12/22 1154  BP: 131/67 (!) 142/74 (!) 152/80 (!) 162/75  Pulse: 60 (!) 59 66 71  Resp: 18 19 18 18   Temp: 98 F (36.7 C) (!) 97.5 F (36.4 C) (!) 97.4 F (36.3 C) (!) 97.5 F (36.4 C)  TempSrc:    Oral  SpO2: 100% 99% 100% 100%  Weight:    97 kg  Height:    5\' 7"  (1.702 m)    Intake/Output Summary (Last 24 hours) at 05/12/2022 1417 Last data filed at 05/12/2022 1401 Gross per 24 hour  Intake 2381.81 ml  Output 1400 ml  Net 981.81 ml   Filed Weights   05/10/22 0611 05/11/22 0500 05/12/22 1154  Weight: 99.3 kg 97 kg 97 kg    Examination:  General exam: Asleep sitting up, woke easily to voice, no acute distress HEENT: NG tube with dark brown drainage in the wall canister, moist mucus membranes, hearing grossly normal  Respiratory system: CTAB with diminished bases, no wheezes, rales or rhonchi, normal respiratory effort. Cardiovascular system: normal S1/S2, RRR, no pedal edema.   Gastrointestinal system: soft, NT, mildly distended, rectal tube in place Central nervous system: no gross focal neurologic deficits, normal speech Extremities: moves all, no edema, normal tone Skin: dry, intact, normal temperature, pale appearing Psychiatry: normal mood, congruent affect, unable to assess judgment and insight at this time    Data Reviewed: I  have personally reviewed following labs and imaging studies  Notable labs & studies --- sodium 146, potassium 2.8>> 3.1, chloride 127, bicarb 20, glucose 125, creatinine stable 1.05, hemoglobin 10.4 stable    LOS: 10 days    Time spent: 45 mins    Pennie Banter, DO [facility]   If 7PM-7AM, please contact night-coverage

## 2022-05-12 NOTE — Op Note (Signed)
Preoperative diagnosis: Sigmoid Volvulus  Postoperative diagnosis: Sigmoid Volvulus.  Procedure: Sigmoid colon resection with colostomy (Hartmann's procedure).   Anesthesia: GETA  Surgeon: Dr. Windell Moment, MD  Assistant: Soundra Pilon, RNFA  Wound Classification: Dirty  Indications:  Patient is a 81 y.o. 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   Description of procedure:  The patient was placed in the supine position and general endotracheal anesthesia was induced. A time-out was completed verifying correct patient, procedure, site, positioning, and implant(s) and/or special equipment prior to beginning this procedure. Preoperative antibiotics were given. A Foley catheter and nasogastric tube were placed. The abdomen was prepped and draped in the usual sterile fashion. A vertical midline incision was made. This was deepened through the subcutaneous tissues and hemostasis was achieved with electrocautery. The linea alba was identified and incised and the peritoneal cavity entered. The abdomen was explored and redundant sigmoid colon was exteriorized. The small bowel was inspected and retracted to the right using a moist towel.  Points of transection were selected proximally and distally. The bowel was divided with the linear cutting stapler. The peritoneum overlying the mesentery was then scored with electrocautery and divided with LigaSure device. The specimen was removed.  The proximal colon reached easily to the proposed colostomy site without tension. A disk of skin was removed from the colostomy site in the left lower quadrant. The incision was deepened through all layers of the abdominal wall and dilated to admit two fingers. The colon was passed out through the ostomy site without torsion or tension.    Using a clean closure technique, the fascia was closed with a running suture of Stratafix #0. The skin was closed with skin staples.  The colostomy was matured  with multiple interrupted sutures of 3-0 Vicryl. An ostomy bag was applied.  The patient tolerated the procedure well and was taken to the postanesthesia care unit in stable condition.   Specimen: Sigmoid colon  Complications: None  EBL: 10 mL

## 2022-05-12 NOTE — Consult Note (Signed)
[facility] for Electrolyte Monitoring and Replacement   Recent Labs: Potassium (mmol/L)  Date Value  05/12/2022 2.8 (L)  10/03/2012 3.1 (L)   Magnesium (mg/dL)  Date Value  05/11/2022 2.0  10/02/2012 1.3 (L)   Calcium (mg/dL)  Date Value  05/12/2022 8.0 (L)   Calcium, Total (mg/dL)  Date Value  10/03/2012 8.2 (L)   Albumin (g/dL)  Date Value  05/09/2022 2.7 (L)  10/02/2012 3.4   Phosphorus (mg/dL)  Date Value  05/11/2022 3.0   Sodium (mmol/L)  Date Value  05/12/2022 146 (H)  10/03/2012 141   Assessment: 81 y.o. male with medical history significant of HTN, HLD, GERD, COPD, CHF (diastolic), seizure disorder, BPH who comes in today with presumed COPD exacerbation. Found to have a PE, started on heparin infusion. Potassium continues to be low.  Diet: NPO, has NGT in place MIVF: D5 1/2 NS + 40 mEq K/L at 125 cc/hr (120 mEq K+/day)  AKI resolved  K 2.9>>2.8 after receiving Kcl 40 mEq per tube x 1 dose and Kcl 10 mEq IV x 4 doses in addition to change to increasing potassium in MIVF to D5 1/2 NS + 40 mEq K/L at 125 cc/hr (120 mEq K+/day)  Goal of Therapy:  Within normal limits  Plan: Patient has procedure planned for 10/4 @ 1330. Per surgery, K must be >3. To achieve this in time, will run Kcl IV replacement simultaneously on two peripheral lines Kcl 10 mEq IV x 3 on right and left peripheral lines (total of 60 mEq IV over 3 hrs) Recheck K level STAT @ 1200 Follow-up electrolytes tomorrow with AM labs  Dara Hoyer, PharmD PGY-1 Pharmacy Resident 05/12/2022 6:26 AM

## 2022-05-12 NOTE — Anesthesia Preprocedure Evaluation (Addendum)
Anesthesia Evaluation  Patient identified by MRN, date of birth, ID band Patient awake    Reviewed: Allergy & Precautions, NPO status , Patient's Chart, lab work & pertinent test results  History of Anesthesia Complications Negative for: history of anesthetic complications  Airway Mallampati: III  TM Distance: <3 FB Neck ROM: full    Dental  (+) Missing, Chipped   Pulmonary pneumonia, unresolved, COPD,    + rhonchi  + decreased breath sounds      Cardiovascular hypertension, (-) angina+CHF  Normal cardiovascular exam     Neuro/Psych Seizures -,  PSYCHIATRIC DISORDERS    GI/Hepatic Neg liver ROS, GERD  ,  Endo/Other  negative endocrine ROS  Renal/GU Renal disease     Musculoskeletal   Abdominal   Peds  Hematology negative hematology ROS (+)   Anesthesia Other Findings Past Medical History: No date: Bipolar 1 disorder (HCC) 05/2016: C. difficile diarrhea     Comment:  history of ... No date: CHF (congestive heart failure) (HCC) 05/21/2016: Chronic kidney disease     Comment:  acute renal failure with sepsis and uti No date: COPD (chronic obstructive pulmonary disease) (HCC) No date: DVT (deep venous thrombosis) (HCC) No date: GERD (gastroesophageal reflux disease) No date: Hematuria No date: High cholesterol No date: History of kidney stones No date: Hypertension 2018: Penile erosion     Comment:  d/t frequent foley catheters No date: Schizophrenia (HCC) No date: Seizure (HCC)     Comment:  on phenobarb No date: Urinary retention     Comment:  frequently requiring foley placement No date: VRE (vancomycin resistant enterococcus) culture positive     Comment:  urine  Past Surgical History: 05/09/2022: COLONOSCOPY WITH PROPOFOL; N/A     Comment:  Procedure: COLONOSCOPY WITH PROPOFOL;  Surgeon: Jaynie Collins, DO;  Location: [facility] ENDOSCOPY;  Service:               Gastroenterology;   Laterality: N/A; 05/05/2017: EXTRACORPOREAL SHOCK WAVE LITHOTRIPSY; Right     Comment:  Procedure: EXTRACORPOREAL SHOCK WAVE LITHOTRIPSY (ESWL);              Surgeon: Vanna Scotland, MD;  Location: [facility] ORS;                Service: Urology;  Laterality: Right; 06/23/2017: IR CATHETER TUBE CHANGE 06/28/2017: IR CATHETER TUBE CHANGE 05/11/2022: IVC FILTER INSERTION; N/A     Comment:  Procedure: IVC FILTER INSERTION;  Surgeon: Renford Dills, MD;  Location: [facility] INVASIVE CV LAB;  Service:              Cardiovascular;  Laterality: N/A;  BMI    Body Mass Index: 33.49 kg/m      Reproductive/Obstetrics negative OB ROS                            Anesthesia Physical Anesthesia Plan  ASA: 3  Anesthesia Plan: General ETT and Rapid Sequence   Post-op Pain Management:    Induction: Intravenous  PONV Risk Score and Plan: Ondansetron, Dexamethasone, Midazolam and Treatment may vary due to age or medical condition  Airway Management Planned: Oral ETT  Additional Equipment:   Intra-op Plan:   Post-operative Plan: Extubation in OR and Possible Post-op intubation/ventilation  Informed Consent: I have reviewed the patients  History and Physical, chart, labs and discussed the procedure including the risks, benefits and alternatives for the proposed anesthesia with the patient or authorized representative who has indicated his/her understanding and acceptance.     Dental Advisory Given  Plan Discussed with: Anesthesiologist, CRNA and Surgeon  Anesthesia Plan Comments: (Psychiatry saw the patient and documented that he has capacity to consent  Patient consented for risks of anesthesia including but not limited to:  - adverse reactions to medications - damage to eyes, teeth, lips or other oral mucosa - nerve damage due to positioning  - sore throat or hoarseness - Damage to heart, brain, nerves, lungs, other parts of body or loss of  life  Patient voiced understanding.)       Anesthesia Quick Evaluation

## 2022-05-12 NOTE — Progress Notes (Signed)
Nutrition Follow-up  DOCUMENTATION CODES:   Non-severe (moderate) malnutrition in context of chronic illness  INTERVENTION:   -Resume dysphagia 3 diet with honey thick liquids per general surgery -Continue Honey Thick Mighty Shake po TID, each supplement provides 200kcal and 7g protein  -Continue Magic cup TID with meals, each supplement provides 290 kcal and 9 grams of protein -Continue MVI po daily  -Continue Vitamin C 500mg  po BID  NUTRITION DIAGNOSIS:   Moderate Malnutrition related to chronic illness as evidenced by mild fat depletion, mild muscle depletion, percent weight loss.  Ongoing  GOAL:   Patient will meet greater than or equal to 90% of their needs  Progressing   MONITOR:   TF tolerance, Diet advancement, Labs, Weight trends, Skin  REASON FOR ASSESSMENT:   Consult Enteral/tube feeding initiation and management  ASSESSMENT:   81 y/o male with h/o HTN, HLD, GERD, COPD, CHF, seizures, BPH, depression and DVT who is admitted with UTI and acute PE.  9/25- s/p BSE- advanced to dysphagia 3 diet with honey thick liquids 9/27- s/p BSE- now NPO secondary to wet, gurgly vocal quality at rest 9/28- NPO due to high aspiration risk 10/1- s/p colonoscopy- revealed volvulus and successful decompression 10/4- s/p Procedure: Sigmoid colon resection with colostomy (Hartmann's procedure).   Reviewed I/O's: -498 ml x 24 hours and +2 L since admission  UOP: 1.8 L x 24 hours   Pt lying in bed at time of visit. Pt sleeping soundly and did not arise to voice. No supports present.   Pt has NGT for decompression and has been without nutrition for 7 days secondary to NPO status. Case discussed with SLP; PO options are limited secondary to need for honey thick liquids at baseline. Discussed with general surgery, palliative care, and hospitalist; goals of care are clear and pt would like to do everything to prolong life (psychiatry has been consulted and deemed that pt has capacity  to make medical decision). Received permission from general surgery to resume previous diet and supplements post-operatively.   Plan for discharge back to SNF once medically stable.   Medications reviewed and include dextrose 5% and 0.45% NaCl with KCl 40 mEq/L infusion @ 125 ml/hr.   Labs reviewed: Na: 146, K: 3.1, CBGS: 94-122.   Diet Order:   Diet Order             DIET DYS 3 Room service appropriate? Yes; Fluid consistency: Honey Thick  Diet effective now                   EDUCATION NEEDS:   Education needs have been addressed  Skin:  Skin Assessment: Skin Integrity Issues: Skin Integrity Issues:: Incisions Stage II: rt and lt buttocks Unstageable: DPTI to lt lower heel Incisions: closed abdomen (new colostomy)  Last BM:  05/12/22 (via rectal tube)  Height:   Ht Readings from Last 1 Encounters:  05/12/22 5\' 7"  (1.702 m)    Weight:   Wt Readings from Last 1 Encounters:  05/12/22 97 kg    Ideal Body Weight:  70 kg  BMI:  Body mass index is 33.49 kg/m.  Estimated Nutritional Needs:   Kcal:  1900-2200kcal/day  Protein:  95-110g/day  Fluid:  1.8-2.1L/day    Loistine Chance, RD, LDN, [place] Registered Dietitian II Certified Diabetes Care and Education Specialist Please refer to [facility]ION for RD and/or RD on-call/weekend/after hours pager

## 2022-05-12 NOTE — Progress Notes (Signed)
Palliative: [name] is resting quietly in bed.  He is resting comfortably, but wakes easily when I call his name.  He is very difficult to understand.  He is alert, oriented.  He is able to make his basic needs known.  He has no family or support system.  We talk about his IVC filter placed yesterday.  He tells me that he is feeling, "okay".  We talk about anticipated surgery today.  We talk about colectomy and ostomy.  He seems to understand.    Overall, [name] is alert and oriented, but I am concerned about making these major decisions without input from a trusted friend or family member.  The first day we met he asked me what I would do.  Unfortunately, he has no contact information for his siblings.  He has been a resident of long-term care about 5 years, he tells me.  Conference with attending, bedside nursing staff, transition of care team related to patient condition, needs, goals of care, disposition.  Plan: Agreeable to surgery and ostomy, time for outcomes.  Full scope/full code.  Anticipate need for short-term rehab, return to long-term care at [facility].  63 minutes  [name], NP Palliative medicine team Team phone [phone] Greater than 50% of this time was spent counseling and coordinating care related to the above assessment and plan.

## 2022-05-13 ENCOUNTER — Encounter: Payer: Self-pay | Admitting: General Surgery

## 2022-05-13 DIAGNOSIS — Z515 Encounter for palliative care: Secondary | ICD-10-CM | POA: Diagnosis not present

## 2022-05-13 DIAGNOSIS — K562 Volvulus: Secondary | ICD-10-CM | POA: Diagnosis not present

## 2022-05-13 DIAGNOSIS — Z7189 Other specified counseling: Secondary | ICD-10-CM | POA: Diagnosis not present

## 2022-05-13 DIAGNOSIS — I2699 Other pulmonary embolism without acute cor pulmonale: Secondary | ICD-10-CM | POA: Diagnosis not present

## 2022-05-13 LAB — CBC
HCT: 32.7 % — ABNORMAL LOW (ref 39.0–52.0)
Hemoglobin: 10.7 g/dL — ABNORMAL LOW (ref 13.0–17.0)
MCH: 28.4 pg (ref 26.0–34.0)
MCHC: 32.7 g/dL (ref 30.0–36.0)
MCV: 86.7 fL (ref 80.0–100.0)
Platelets: 224 10*3/uL (ref 150–400)
RBC: 3.77 MIL/uL — ABNORMAL LOW (ref 4.22–5.81)
RDW: 16.4 % — ABNORMAL HIGH (ref 11.5–15.5)
WBC: 8.2 10*3/uL (ref 4.0–10.5)
nRBC: 0 % (ref 0.0–0.2)

## 2022-05-13 LAB — BASIC METABOLIC PANEL
Anion gap: 3 — ABNORMAL LOW (ref 5–15)
BUN: 9 mg/dL (ref 8–23)
CO2: 18 mmol/L — ABNORMAL LOW (ref 22–32)
Calcium: 7.9 mg/dL — ABNORMAL LOW (ref 8.9–10.3)
Chloride: 124 mmol/L — ABNORMAL HIGH (ref 98–111)
Creatinine, Ser: 0.99 mg/dL (ref 0.61–1.24)
GFR, Estimated: >60 mL/min (ref >60)
Glucose, Bld: 181 mg/dL — ABNORMAL HIGH (ref 70–99)
Potassium: 3.7 mmol/L (ref 3.5–5.1)
Sodium: 145 mmol/L (ref 135–145)

## 2022-05-13 LAB — GLUCOSE, CAPILLARY
Glucose-Capillary: 166 mg/dL — ABNORMAL HIGH (ref 70–99)
Glucose-Capillary: 122 mg/dL — ABNORMAL HIGH (ref 70–99)
Glucose-Capillary: 257 mg/dL — ABNORMAL HIGH (ref 70–99)
Glucose-Capillary: 114 mg/dL — ABNORMAL HIGH (ref 70–99)
Glucose-Capillary: 134 mg/dL — ABNORMAL HIGH (ref 70–99)

## 2022-05-13 LAB — MAGNESIUM: Magnesium: 1.8 mg/dL (ref 1.7–2.4)

## 2022-05-13 LAB — PHOSPHORUS: Phosphorus: 2.5 mg/dL (ref 2.5–4.6)

## 2022-05-13 MED ORDER — POLYETHYLENE GLYCOL 3350 17 G PO PACK
17.0000 g | PACK | Freq: Every day | ORAL | Status: DC
  Administered 2022-05-13 – 2022-05-17 (×5): 17 g via ORAL
  Filled 2022-05-13 (×5): qty 1

## 2022-05-13 MED ORDER — APIXABAN 5 MG PO TABS
5.0000 mg | ORAL_TABLET | Freq: Two times a day (BID) | ORAL | Status: DC
  Administered 2022-05-13 – 2022-05-17 (×8): 5 mg via ORAL
  Filled 2022-05-13 (×8): qty 1

## 2022-05-13 MED ORDER — DEXTROMETHORPHAN POLISTIREX ER 30 MG/5ML PO SUER
5.0000 mL | Freq: Two times a day (BID) | ORAL | Status: DC | PRN
  Administered 2022-05-13 – 2022-05-15 (×2): 30 mg via ORAL
  Filled 2022-05-13 (×2): qty 5

## 2022-05-13 NOTE — Progress Notes (Signed)
Patient ID: [name], male   DOB: [birth year], 81 y.o.   MRN: [id number]     [facility] Day(s): 11.   Interval History: Patient seen and examined, no acute events or new complaints overnight.  Initial denies any issues overnight.  Patient evaluated this morning and found sleeping without any distress.  He was arousable.  Denies complaints.  Vital signs in last 24 hours: [min-max] current  Temp:  [97.7 F (36.5 C)-98.8 F (37.1 C)] 98.3 F (36.8 C) (10/05 1614) Pulse Rate:  [72-86] 82 (10/05 1614) Resp:  [16-18] 18 (10/05 1614) BP: (108-161)/(58-89) 108/58 (10/05 1614) SpO2:  [98 %-100 %] 100 % (10/05 1614) Weight:  [96.8 kg] 96.8 kg (10/05 0519)     Height: 5\' 7"  (170.2 cm) Weight: 96.8 kg BMI (Calculated): 33.42   Physical Exam:  Constitutional: alert, cooperative and no distress  Respiratory: breathing non-labored at rest  Cardiovascular: regular rate and sinus rhythm  Gastrointestinal: soft, non-tender, and non-distended.  Colostomy is pink and patent.  Labs:     Latest Ref Rng & Units 05/13/2022    4:05 AM 05/12/2022    4:41 AM 05/11/2022    4:56 AM  CBC  WBC 4.0 - 10.5 K/uL 8.2  6.9  7.1   Hemoglobin 13.0 - 17.0 g/dL 10.7  10.4  10.8   Hematocrit 39.0 - 52.0 % 32.7  32.6  33.8   Platelets 150 - 400 K/uL 224  237  243       Latest Ref Rng & Units 05/13/2022    4:05 AM 05/12/2022    7:28 PM 05/12/2022   10:48 AM  CMP  Glucose 70 - 99 mg/dL 181  173    BUN 8 - 23 mg/dL 9  8    Creatinine 0.61 - 1.24 mg/dL 0.99  1.18    Sodium 135 - 145 mmol/L 145  145    Potassium 3.5 - 5.1 mmol/L 3.7  3.0  3.1   Chloride 98 - 111 mmol/L 124  123    CO2 22 - 32 mmol/L 18  18    Calcium 8.9 - 10.3 mg/dL 7.9  8.2      Imaging studies: No new pertinent imaging studies   Assessment/Plan:  81 y.o. male with sigmoid volvulus 1 Day Post-Op s/p sigmoid colectomy and end colostomy creation.  -Vital signs stable.  No fever, no tachycardia. -No complaints.  Pain  apparently is under control. -Ostomy without gas or stool.  We will add MiraLAX for constipation. -Contraindication for physical therapy -No contraindication for anticoagulation. -I will continue to follow closely.  Arnold Long, MD

## 2022-05-13 NOTE — Anesthesia Postprocedure Evaluation (Signed)
Anesthesia Post Note  Patient: [name]  Procedure(s) Performed: EXPLORATORY LAPAROTOMY (Abdomen) PARTIAL COLECTOMY WITH COLOSTOMY CREATION/HARTMANN PROCEDURE (Abdomen)  Patient location during evaluation: PACU Anesthesia Type: General Level of consciousness: awake and alert Pain management: pain level controlled Vital Signs Assessment: post-procedure vital signs reviewed and stable Respiratory status: spontaneous breathing, nonlabored ventilation, respiratory function stable and patient connected to nasal cannula oxygen Cardiovascular status: blood pressure returned to baseline and stable Postop Assessment: no apparent nausea or vomiting Anesthetic complications: no   No notable events documented.   Last Vitals:  Vitals:   05/13/22 0717 05/13/22 0754  BP: 134/73   Pulse: 74   Resp: 16   Temp: 37.1 C   SpO2: 99% 98%    Last Pain:  Vitals:   05/13/22 0717  TempSrc:   PainSc: 0-No pain                 Precious Haws Irelynd Zumstein

## 2022-05-13 NOTE — Consult Note (Signed)
[name] for Electrolyte Monitoring and Replacement   Recent Labs: Potassium (mmol/L)  Date Value  05/13/2022 3.7  10/03/2012 3.1 (L)   Magnesium (mg/dL)  Date Value  05/13/2022 1.8  10/02/2012 1.3 (L)   Calcium (mg/dL)  Date Value  05/13/2022 7.9 (L)   Calcium, Total (mg/dL)  Date Value  10/03/2012 8.2 (L)   Albumin (g/dL)  Date Value  05/09/2022 2.7 (L)  10/02/2012 3.4   Phosphorus (mg/dL)  Date Value  05/13/2022 2.5   Sodium (mmol/L)  Date Value  05/13/2022 145  10/03/2012 141   Assessment: 81 y.o. male with medical history significant of HTN, HLD, GERD, COPD, CHF (diastolic), seizure disorder, BPH who comes in today with presumed COPD exacerbation. Found to have a PE, started on heparin infusion. Potassium continues to be low.  MIVF: D5 1/2 NS + 40 mEq K/L at 125 cc/hr (120 mEq K+/day)  K 3.0>>3.7 with MIVF to D5 1/2 NS + 40 mEq K/L at 125 cc/hr (120 mEq K+/day) and receiving Kcl 40 mEq PO twice daily, along with an additional Kcl 10 mEq IV x 2 given last night.  Goal of Therapy:  Within normal limits  Plan: 10/4: Partial colectomy with colostomy creation (successful procedure; no complication) No additional K supplementation at this time, given MIVF with K 40 mEq/L and sch 40 mEq PO twice daily Mg 1.8 >> WNL; no repletion Corrected Ca 8.9 >> WNL; no repletion Follow-up K and other electrolytes tomorrow with AM labs  Dara Hoyer, PharmD PGY-1 Pharmacy Resident 05/13/2022 8:10 AM

## 2022-05-13 NOTE — Consult Note (Addendum)
[facility] Nurse ostomy consult note Pt had colostomy surgery performed 10/4. He does not appear to understand when ostomy care was discussed and no family members are present.  Stoma type/location: Stoma is red and viable, above skin level, 1 1/2 inches Peristomal assessment: intact skin surrounding Output: 50cc liquid brown stool Ostomy pouching: 2pc. Current pouch was leaking behind the barrier. Applied barrier ring to attempt to maintain a seal, and 2 piece pouching system. 4 sets of extra supplies left in the room for staff nurses use. Use supplies: barrier ring, [facility] # [phone] and wafer Kellie Simmering # [phone] and pouch [facility] # [id number] Education provided: Pt will need total assistance with pouching activities after discharge. According to progress notes; "Pt's mentation fluctuates per chart review and he is unable to make his own decisions with poor insight per palliative care team. No alternative decision maker has been identified. He has been a resident of long-term care about 5 years."  Enrolled patient in [facility] program: No; Pt plans to return to SNF after discharge. Thank-you,  Julien Girt MSN, [place], [place], [place], [place]

## 2022-05-13 NOTE — Progress Notes (Addendum)
PROGRESS NOTE    [name]  TGG:[id number] DOB: Jul 04, 1941 DOA: 05/02/2022 PCP: Rosetta Posner, MD   Brief Narrative:  This 81 y.o. male with medical history significant of HTN, HLD, GERD, COPD, CHF (diastolic), seizure disorder, BPH who comes in ED with presumed COPD exacerbation.  Per the EDP he was wheezing on admission and hypoxic.  EMS had to put him on CPAP to get his oxygen saturations up.  His blood gases were essentially normal.  He was weaned down to nasal cannula. CT chest with contrast Filling defects within right lower lobe pulmonary arterial branches compatible with pulmonary emboli.  Patient was admitted for acute pulmonary embolism, started on IV heparin.  Patient has significant risk of aspiration.  Speech and swallow recommended n.p.o. with alternate means of feeding.  Palliative care consulted to discuss goals of care.    Psychiatry consulted due to concern for patient's capacity to make decision and no alternate decision-maker has been identified at this time.  Ethics committee consulted to make a determination since patient has no next of kin.  Psychiatry consulted patient does have a capacity to make decisions.  Patient is agreeable for surgical intervention.    Patient has developed sigmoid volvulus overnight.  General surgery and GI consulted.  Patient underwent emergent colonoscopy with decompression of the volvulus.   He underwent IVC filter for pulmonary embolism , since patient needs to be off anticoagulation for 48 hours.    Patient was taken to OR on 10/4 and underwent Hartmann's procedure.     Assessment & Plan:   Principal Problem:   Pulmonary embolism (HCC) Active Problems:   UTI (urinary tract infection)   Chronic diastolic CHF (congestive heart failure) (HCC)   Seizure disorder (HCC)   Depression   Hypertension   Mixed hyperlipidemia   BPH (benign prostatic hyperplasia)   Normocytic anemia   Hypokalemia   DVT of lower extremity, bilateral  (HCC)   Venous insufficiency of both lower extremities   COPD exacerbation (HCC)   Malnutrition of moderate degree   Acute deep vein thrombosis (DVT) of left lower extremity (HCC)   Acute respiratory failure with hypoxia (HCC)   Sigmoid volvulus (HCC)  Acute pulmonary embolism / Acute Right LE DVT:  Patient found to have acute pulmonary embolism and acute Right LE DVT He was started on heparin gtt. Likely primary cause for his respiratory distress and oxygen requirements. Continue supplemental oxygen as needed. Patient underwent IVC filter placement 10/3 in order to be off anticoagulation for surgery. Resumed on Eliquis 10/5.  UTI in the setting of chronic suprapubic catheter: Patient has indwelling suprapubic catheter and urine had a lot of sediment in it. Suprapubic catheter was changed  (05/03/22) It was changed on August 25th 2023 at urology office  Aspiration pneumonia: Completed antibiotics for aspiration pneumonia. Chest x-ray shows improving infiltrate. NG tube inserted for medications and for feeding. Now resumed on diet and NG removed.  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   General surgery and GI consulted.   Patient underwent emergent colonoscopy with decompression of the volvulus with rectal tube.   Definitive treatment would be surgical correction.   Patient is at high risk for volvulus recurrence.  Decompression provides time but not a definite treatment.   Ethics committee consulted to make a determination since patient has no next of kin.  Psych consulted.  Patient does have capacity to make decisions. Pt underwent Hartmann's procedure on 10/4. WOC consulted for ostomy care. Diet advancement  per surgery. Pain control PRN. Resume PT/OT  Chronic diastolic CHF: Lisinopril, carvedilol and Lasix on hold because of low blood pressure. BNP is slightly elevated but chest x-ray does not show fluid overload.   Seizure disorder  (HCC) Continue phenobarbital.   Depression Continue Wellbutrin, Abilify.  Hypokalemia: Replacement in progress.  Continue to monitor   Hypertension Hold lisinopril, carvedilol because of low blood pressure.   Mixed hyperlipidemia Continue Pravachol.   BPH (benign prostatic hyperplasia) Continue Proscar.   Normocytic anemia Hemoglobin is stable.  Monitor CBC   COPD: Currently no wheezing. Continue inhalers.   Venous insufficiency of both lower extremities Continue Lasix as needed   Dysphagia with high aspiration risk: Speech and swallow evaluation completed, was initially felt very high risk for aspiration & recommended NPO. Completed course of antibiotics for aspiration pneumonia Palliative care consulted to discuss goals of care Temporarily had NG tube for feeding and for medications. Now on dysphagia-3 diet with honey thickened liquids. Remains high risk for recurrent aspiration and infection.  Obesity: Estimated body mass index is 33.42 kg/m as calculated from the following:   Height as of this encounter: 5\' 7"  (1.702 m).   Weight as of this encounter: 96.8 kg.      DVT prophylaxis: Eliquis Code Status: Full code Family Communication: No family at bedside Disposition Plan:   Status is: Inpatient Remains inpatient appropriate because: POD 1 from Hartmann's procedure.  Patient is not medically clear.  DSS being contacted about guardianship given no appropriate surrogate decision maker for patient.   Consultants:  General surgery Palliative care Vascular surgery for IVC filter placement  Procedures:    Subjective: Patient was awake sitting up in bed, appeared drowsy.  He denies abdominal pain or other complaints.  No acute events reported.     Objective: Vitals:   05/13/22 0754 05/13/22 1142 05/13/22 1614 05/13/22 2056  BP:  115/81 (!) 108/58 116/62  Pulse:  75 82   Resp:  16 18 20   Temp:  98.3 F (36.8 C) 98.3 F (36.8 C) 98.2 F (36.8 C)   TempSrc:  Oral Oral Oral  SpO2: 98% 100% 100% 98%  Weight:      Height:        Intake/Output Summary (Last 24 hours) at 05/13/2022 2058 Last data filed at 05/13/2022 1821 Gross per 24 hour  Intake 901.53 ml  Output 1250 ml  Net -348.47 ml   Filed Weights   05/11/22 0500 05/12/22 1154 05/13/22 0519  Weight: 97 kg 97 kg 96.8 kg    Examination:  General exam: Asleep sitting up, woke easily to voice, no acute distress HEENT: NG tube with dark brown drainage in the wall canister, moist mucus membranes, hearing grossly normal  Respiratory system: CTAB with diminished bases, no wheezes, rales or rhonchi, normal respiratory effort. Cardiovascular system: normal S1/S2, RRR, no pedal edema.   Gastrointestinal system: soft, NT, mildly distended, rectal tube in place Central nervous system: no gross focal neurologic deficits, normal speech Extremities: moves all, no edema, normal tone Skin: dry, intact, normal temperature, pale appearing Psychiatry: normal mood, congruent affect, unable to assess judgment and insight at this time    Data Reviewed: I have personally reviewed following labs and imaging studies  Notable labs & studies ---     LOS: 11 days    Time spent: 40 mins    Ezekiel Slocumb, DO [facility]   If 7PM-7AM, please contact night-coverage

## 2022-05-13 NOTE — Progress Notes (Signed)
Palliative: Mr. Buras is resting quietly in bed.  He appears acutely/chronically ill and quite frail.  He is resting comfortably, but wakes easily when I call his name.  He makes and somewhat keeps eye contact.  He is alert, oriented, able to make his basic needs known.  There is no family at bedside at this time.  I ask how he is doing, if he is having pain in his belly, he states that he is okay, no pain.    Mr. Garguilo has been a resident of long-term care for approximately 5 years.  He would return to his long-term care facility.  He would benefit from support from DSS to help with complex decisions as he has no family locally.  Conference with attending, bedside nursing staff, transition of care team related to patient condition, needs, goals of care, disposition.  Plan:   At this point continue full scope/full code.  Anticipate need for short-term rehab then return to long-term care facility, [facility], where he has been for approximately 5 years.  Would benefit from outpatient palliative services.  34  minutes  [name], NP Palliative medicine team Team phone [phone] Greater than 50% of this time was spent counseling and coordinating care related to the above assessment and plan.

## 2022-05-14 DIAGNOSIS — I2699 Other pulmonary embolism without acute cor pulmonale: Secondary | ICD-10-CM | POA: Diagnosis not present

## 2022-05-14 DIAGNOSIS — I5032 Chronic diastolic (congestive) heart failure: Secondary | ICD-10-CM | POA: Diagnosis not present

## 2022-05-14 DIAGNOSIS — K562 Volvulus: Secondary | ICD-10-CM | POA: Diagnosis not present

## 2022-05-14 LAB — SURGICAL PATHOLOGY

## 2022-05-14 LAB — CBC
HCT: 28.6 % — ABNORMAL LOW (ref 39.0–52.0)
Hemoglobin: 9.1 g/dL — ABNORMAL LOW (ref 13.0–17.0)
MCH: 28.1 pg (ref 26.0–34.0)
MCHC: 31.8 g/dL (ref 30.0–36.0)
MCV: 88.3 fL (ref 80.0–100.0)
Platelets: 198 10*3/uL (ref 150–400)
RBC: 3.24 MIL/uL — ABNORMAL LOW (ref 4.22–5.81)
RDW: 17 % — ABNORMAL HIGH (ref 11.5–15.5)
WBC: 7.6 10*3/uL (ref 4.0–10.5)
nRBC: 0 % (ref 0.0–0.2)

## 2022-05-14 LAB — BASIC METABOLIC PANEL
Anion gap: 2 — ABNORMAL LOW (ref 5–15)
Anion gap: <0 — ABNORMAL LOW (ref 5–15)
BUN: 11 mg/dL (ref 8–23)
BUN: 11 mg/dL (ref 8–23)
CO2: 16 mmol/L — ABNORMAL LOW (ref 22–32)
CO2: 20 mmol/L — ABNORMAL LOW (ref 22–32)
Calcium: 8.1 mg/dL — ABNORMAL LOW (ref 8.9–10.3)
Calcium: 8.1 mg/dL — ABNORMAL LOW (ref 8.9–10.3)
Chloride: 129 mmol/L — ABNORMAL HIGH (ref 98–111)
Chloride: 124 mmol/L — ABNORMAL HIGH (ref 98–111)
Creatinine, Ser: 1.08 mg/dL (ref 0.61–1.24)
Creatinine, Ser: 1.01 mg/dL (ref 0.61–1.24)
GFR, Estimated: >60 mL/min (ref >60)
GFR, Estimated: >60 mL/min (ref >60)
Glucose, Bld: 114 mg/dL — ABNORMAL HIGH (ref 70–99)
Glucose, Bld: 98 mg/dL (ref 70–99)
Potassium: 5.2 mmol/L — ABNORMAL HIGH (ref 3.5–5.1)
Potassium: 4.5 mmol/L (ref 3.5–5.1)
Sodium: 147 mmol/L — ABNORMAL HIGH (ref 135–145)
Sodium: 142 mmol/L (ref 135–145)

## 2022-05-14 LAB — GLUCOSE, CAPILLARY
Glucose-Capillary: 101 mg/dL — ABNORMAL HIGH (ref 70–99)
Glucose-Capillary: 111 mg/dL — ABNORMAL HIGH (ref 70–99)
Glucose-Capillary: 113 mg/dL — ABNORMAL HIGH (ref 70–99)
Glucose-Capillary: 114 mg/dL — ABNORMAL HIGH (ref 70–99)
Glucose-Capillary: 116 mg/dL — ABNORMAL HIGH (ref 70–99)
Glucose-Capillary: 118 mg/dL — ABNORMAL HIGH (ref 70–99)

## 2022-05-14 LAB — PHOSPHORUS: Phosphorus: 2.6 mg/dL (ref 2.5–4.6)

## 2022-05-14 LAB — MAGNESIUM: Magnesium: 1.8 mg/dL (ref 1.7–2.4)

## 2022-05-14 MED ORDER — FUROSEMIDE 10 MG/ML IJ SOLN
20.0000 mg | Freq: Once | INTRAMUSCULAR | Status: AC
  Administered 2022-05-14: 20 mg via INTRAVENOUS
  Filled 2022-05-14: qty 2

## 2022-05-14 NOTE — Consult Note (Signed)
[facility] for Electrolyte Monitoring and Replacement   Recent Labs: Potassium (mmol/L)  Date Value  05/14/2022 5.2 (H)  10/03/2012 3.1 (L)   Magnesium (mg/dL)  Date Value  05/14/2022 1.8  10/02/2012 1.3 (L)   Calcium (mg/dL)  Date Value  05/14/2022 8.1 (L)   Calcium, Total (mg/dL)  Date Value  10/03/2012 8.2 (L)   Albumin (g/dL)  Date Value  05/09/2022 2.7 (L)  10/02/2012 3.4   Phosphorus (mg/dL)  Date Value  05/14/2022 2.6   Sodium (mmol/L)  Date Value  05/14/2022 147 (H)  10/03/2012 141   Assessment: 81 y.o. male with medical history significant of HTN, HLD, GERD, COPD, CHF (diastolic), seizure disorder, BPH who comes in today with presumed COPD exacerbation. Found to have a PE, started on heparin infusion. Potassium continues to be low.  K 3.7 >> 5.2 s/p partial colectomy and resolution of sigmoid volvulus. Pt currently has MIVF with D5 1/2 NS + 40 mEq K/L at 125 cc/hr (120 mEq K+/day) and 40  mEq PO BID.  Goal of Therapy:  Within normal limits  Plan: 10/4: Partial colectomy with colostomy creation (successful procedure; no complication) K 3.7 >> 5.2; stopping 40 mEq PO BID and MIVF fluids (pt becoming edematous) Mg 1.8; WNL, no repletion Phos 2.5 >> 2.6; WNL, no repletion Corrected Ca 9.1 >> WNL, no repletion Follow-up electrolytes tomorrow with AM labs  Dara Hoyer, PharmD PGY-1 Pharmacy Resident 05/14/2022 7:02 AM

## 2022-05-14 NOTE — Plan of Care (Signed)

## 2022-05-14 NOTE — Progress Notes (Signed)
Patient ID: [name], male   DOB: [birth year], 81 y.o.   MRN: [id number]     [facility] Day(s): 12.   Interval History: Patient seen and examined, no acute events or new complaints overnight. Patient reports doing well.  He denies any complaint this morning.  He feels hungry and wants breakfast.  Denies any nausea or vomiting.  Vital signs in last 24 hours: [min-max] current  Temp:  [97.7 F (36.5 C)-98.3 F (36.8 C)] 98.3 F (36.8 C) (10/06 0757) Pulse Rate:  [75-82] 77 (10/06 0757) Resp:  [16-20] 17 (10/06 0757) BP: (108-120)/(52-81) 120/58 (10/06 0757) SpO2:  [96 %-100 %] 96 % (10/06 0757) Weight:  [102.1 kg] 102.1 kg (10/06 0630)     Height: 5\' 7"  (170.2 cm) Weight: 102.1 kg BMI (Calculated): 35.25   Physical Exam:  Constitutional: alert, cooperative and no distress  Respiratory: breathing non-labored at rest  Cardiovascular: regular rate and sinus rhythm  Gastrointestinal: soft, non-tender, and non-distended.  Colostomy is pink and patent.  Stool and gas in bag.  Labs:     Latest Ref Rng & Units 05/14/2022    4:29 AM 05/13/2022    4:05 AM 05/12/2022    4:41 AM  CBC  WBC 4.0 - 10.5 K/uL 7.6  8.2  6.9   Hemoglobin 13.0 - 17.0 g/dL 9.1  10.7  10.4   Hematocrit 39.0 - 52.0 % 28.6  32.7  32.6   Platelets 150 - 400 K/uL 198  224  237       Latest Ref Rng & Units 05/14/2022    4:29 AM 05/13/2022    4:05 AM 05/12/2022    7:28 PM  CMP  Glucose 70 - 99 mg/dL 114  181  173   BUN 8 - 23 mg/dL 11  9  8    Creatinine 0.61 - 1.24 mg/dL 1.08  0.99  1.18   Sodium 135 - 145 mmol/L 147  145  145   Potassium 3.5 - 5.1 mmol/L 5.2  3.7  3.0   Chloride 98 - 111 mmol/L 129  124  123   CO2 22 - 32 mmol/L 16  18  18    Calcium 8.9 - 10.3 mg/dL 8.1  7.9  8.2     Imaging studies: No new pertinent imaging studies   Assessment/Plan:  81 y.o. male with sigmoid volvulus 2 Day Post-Op s/p sigmoid colectomy and end colostomy creation.  -Patient continue recovering  adequately.  Adequate vital signs. -Colostomy with adequate return of bowel function.  There is stool and gas in bag. -Patient tolerating diet -No contraindication for physical therapy -Continue to collation -Patient may start discharge planning from neurosurgical standpoint -I will continue to follow.  Arnold Long, MD

## 2022-05-14 NOTE — Progress Notes (Addendum)
PROGRESS NOTE    [name]  UYQ:[id number] DOB: November 15, 1940 DOA: 05/02/2022 PCP: Rosetta Posner, MD   Brief Narrative:  This 81 y.o. male with medical history significant of HTN, HLD, GERD, COPD, CHF (diastolic), seizure disorder, BPH who comes in ED with presumed COPD exacerbation.  Per the EDP he was wheezing on admission and hypoxic.  EMS had to put him on CPAP to get his oxygen saturations up.  His blood gases were essentially normal.  He was weaned down to nasal cannula. CT chest with contrast Filling defects within right lower lobe pulmonary arterial branches compatible with pulmonary emboli.  Patient was admitted for acute pulmonary embolism, started on IV heparin.  Patient has significant risk of aspiration.  Speech and swallow recommended n.p.o. with alternate means of feeding.  Palliative care consulted to discuss goals of care.    Psychiatry consulted due to concern for patient's capacity to make decision and no alternate decision-maker has been identified at this time.  Ethics committee consulted to make a determination since patient has no next of kin.  Psychiatry consulted patient does have a capacity to make decisions.  Patient is agreeable for surgical intervention.    Patient has developed sigmoid volvulus overnight.  General surgery and GI consulted.  Patient underwent emergent colonoscopy with decompression of the volvulus.   He underwent IVC filter for pulmonary embolism , since patient needs to be off anticoagulation for 48 hours.    Patient was taken to OR on 10/4 and underwent Hartmann's procedure.     Assessment & Plan:   Principal Problem:   Pulmonary embolism (HCC) Active Problems:   UTI (urinary tract infection)   Chronic diastolic CHF (congestive heart failure) (HCC)   Seizure disorder (HCC)   Depression   Hypertension   Mixed hyperlipidemia   BPH (benign prostatic hyperplasia)   Normocytic anemia   Hypokalemia   DVT of lower extremity, bilateral  (HCC)   Venous insufficiency of both lower extremities   Pressure injury of skin   COPD exacerbation (HCC)   Malnutrition of moderate degree   Acute deep vein thrombosis (DVT) of left lower extremity (HCC)   Acute respiratory failure with hypoxia (HCC)   Sigmoid volvulus (HCC)  Acute pulmonary embolism / Acute Right LE DVT:  Patient found to have acute pulmonary embolism and acute Right LE DVT He was started on heparin gtt. Likely primary cause for his respiratory distress and oxygen requirements. Continue supplemental oxygen as needed. Patient underwent IVC filter placement 10/3 in order to be off anticoagulation for surgery. Resumed on Eliquis 10/5.  UTI in the setting of chronic suprapubic catheter: Patient has indwelling suprapubic catheter and urine had a lot of sediment in it. Suprapubic catheter was changed  (05/03/22) It was changed on August 25th 2023 at urology office  Aspiration pneumonia: Completed antibiotics for aspiration pneumonia. Chest x-ray shows improving infiltrate. NG tube inserted for medications and for feeding. Now resumed on diet and NG removed.  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   General surgery and GI consulted.   Patient underwent emergent colonoscopy with decompression of the volvulus with rectal tube.   Definitive treatment would be surgical correction.   Patient is at high risk for volvulus recurrence.  Decompression provides time but not a definite treatment.   Ethics committee consulted to make a determination since patient has no next of kin.  Psych consulted.  Patient does have capacity to make decisions. Pt underwent Hartmann's procedure on 10/4. WOC  consulted for ostomy care. Diet advancement per surgery. Pain control PRN. Resume PT/OT   Hypernatremia: Na 147 due to poor PO intake, not drinking enough water (is on honey-thickened liquids). Mild, hold off D5w for now as pt developing anasarca. Repeat BMP in  AM  Hyperkalemia: due to aggressive replacement Stop IV fluids with Kcl. K 5.2, will repeat a BMP later and treat to lower if rising.  Hypokalemia: Replaced.  Continue to monitor    Chronic diastolic CHF: Lisinopril, carvedilol and Lasix on hold because of low blood pressure. BNP is slightly elevated but chest x-ray does not show fluid overload. --20 mg IV Lasix x 1  --I/O's, daily weights --Monitor renal function, electrolytes   Seizure disorder (HCC) Continue phenobarbital.   Depression Continue Wellbutrin, Abilify.  Hypertension Hold lisinopril, carvedilol because of low blood pressure.   Mixed hyperlipidemia Continue Pravachol.   BPH (benign prostatic hyperplasia) Continue Proscar.   Normocytic anemia Hemoglobin is stable.  Monitor CBC   COPD: Currently no wheezing. Continue inhalers.   Venous insufficiency of both lower extremities Continue Lasix as needed   Dysphagia with high aspiration risk: Speech and swallow evaluation completed, was initially felt very high risk for aspiration & recommended NPO. Completed course of antibiotics for aspiration pneumonia Palliative care consulted to discuss goals of care Temporarily had NG tube for feeding and for medications. Now on dysphagia-3 diet with honey thickened liquids. Remains high risk for recurrent aspiration and infection.  Obesity: Estimated body mass index is 34.63 kg/m as calculated from the following:   Height as of this encounter: 5\' 7"  (1.702 m).   Weight as of this encounter: 100.3 kg.    Pressure injury of skin - POA --I agree with wound descriptions as below. --Monitor closely --Diligent wound care --Reposition pt every 2 hrs --Offload pressure areas  Pressure Injury 05/03/22 Buttocks Right Stage 2 -  Partial thickness loss of dermis presenting as a shallow open injury with a red, pink wound bed without slough. 1X1cm (Active)  05/03/22 0858  Location: Buttocks  Location Orientation:  Right  Staging: Stage 2 -  Partial thickness loss of dermis presenting as a shallow open injury with a red, pink wound bed without slough.  Wound Description (Comments): 1X1cm  Present on Admission: Yes     Pressure Injury 05/03/22 Buttocks Left Stage 2 -  Partial thickness loss of dermis presenting as a shallow open injury with a red, pink wound bed without slough. 1x1cm (Active)  05/03/22 0859  Location: Buttocks  Location Orientation: Left  Staging: Stage 2 -  Partial thickness loss of dermis presenting as a shallow open injury with a red, pink wound bed without slough.  Wound Description (Comments): 1x1cm  Present on Admission: Yes       DVT prophylaxis: Eliquis Code Status: Full code Family Communication: No family at bedside Disposition Plan:   Status is: Inpatient Remains inpatient appropriate because: POD 2 from Hartmann's procedure.  Patient is not medically clear. Electrolyte derangements ongoing and worsening. Inadequate PO intake. Pt is extremely ill, frail, debilitated and malnourished.  He is at high risk of poor outcomes and readmissions.  DSS being contacted about guardianship given no appropriate surrogate decision maker for patient.   Consultants:  General surgery Palliative care Vascular surgery for IVC filter placement  Procedures:    Subjective: Patient was awake sitting up in bed when seen today.  He reports some abdominal pain, severe at times, moderately controlled with medication, but could be controlled better.  He denies otherwise having any issues.   Objective: Vitals:   05/15/22 0008 05/15/22 0352 05/15/22 0410 05/15/22 0727  BP: (!) 141/75 126/67  111/74  Pulse: 84 77  81  Resp: 17 17  18   Temp: 98.9 F (37.2 C) 99.3 F (37.4 C)  99 F (37.2 C)  TempSrc: Oral Oral  Oral  SpO2: 100% 99%  99%  Weight:   100.3 kg   Height:        Intake/Output Summary (Last 24 hours) at 05/15/2022 0847 Last data filed at 05/15/2022 0300 Gross per 24  hour  Intake 840 ml  Output 5200 ml  Net -4360 ml   Filed Weights   05/13/22 0519 05/14/22 0630 05/15/22 0410  Weight: 96.8 kg 102.1 kg 100.3 kg    Examination:  General exam: awake sitting up, woke easily to voice, no acute distress Respiratory system: CTAB with diminished bases, no wheezes, normal respiratory effort on room air. Cardiovascular system: normal S1/S2, RRR, no pedal edema.   Gastrointestinal system: soft, appropriate postop tenderness, mildly distended, colostomy bag with liquid stool and air in bag,  Central nervous system: alert, oriented to self and hospital, no gross focal neurologic deficits, normal speech Extremities: dependent edema of bilateral thighs, off-loading boots on feet, less distal LE edema Skin: dry, intact, normal temperature, pale appearing Psychiatry: normal mood, flat affect    Data Reviewed: I have personally reviewed following labs and imaging studies  Notable labs & studies --- Na 147, K 5.2, Cl 129, Cow 16, glucose 114, Ca 8.1, gap 2, Hbg 9.1,     LOS: 13 days    Time spent: 40 mins    Ezekiel Slocumb, DO [facility]   If 7PM-7AM, please contact night-coverage

## 2022-05-14 NOTE — Assessment & Plan Note (Signed)
Pressure Injury 05/03/22 Buttocks Right Stage 2 -  Partial thickness loss of dermis presenting as a shallow open injury with a red, pink wound bed without slough. 1X1cm (Active)  05/03/22 0858  Location: Buttocks  Location Orientation: Right  Staging: Stage 2 -  Partial thickness loss of dermis presenting as a shallow open injury with a red, pink wound bed without slough.  Wound Description (Comments): 1X1cm  Present on Admission: Yes     Pressure Injury 05/03/22 Buttocks Left Stage 2 -  Partial thickness loss of dermis presenting as a shallow open injury with a red, pink wound bed without slough. 1x1cm (Active)  05/03/22 0859  Location: Buttocks  Location Orientation: Left  Staging: Stage 2 -  Partial thickness loss of dermis presenting as a shallow open injury with a red, pink wound bed without slough.  Wound Description (Comments): 1x1cm  Present on Admission: Yes   I agree with with above wound description. --Diligent wound care per orders --Frequently reposition pt every 2 hrs

## 2022-05-15 DIAGNOSIS — K562 Volvulus: Secondary | ICD-10-CM | POA: Diagnosis not present

## 2022-05-15 DIAGNOSIS — E872 Acidosis, unspecified: Secondary | ICD-10-CM | POA: Diagnosis not present

## 2022-05-15 DIAGNOSIS — I5032 Chronic diastolic (congestive) heart failure: Secondary | ICD-10-CM | POA: Diagnosis not present

## 2022-05-15 DIAGNOSIS — I2699 Other pulmonary embolism without acute cor pulmonale: Secondary | ICD-10-CM | POA: Diagnosis not present

## 2022-05-15 LAB — GLUCOSE, CAPILLARY
Glucose-Capillary: 100 mg/dL — ABNORMAL HIGH (ref 70–99)
Glucose-Capillary: 121 mg/dL — ABNORMAL HIGH (ref 70–99)
Glucose-Capillary: 127 mg/dL — ABNORMAL HIGH (ref 70–99)
Glucose-Capillary: 133 mg/dL — ABNORMAL HIGH (ref 70–99)
Glucose-Capillary: 135 mg/dL — ABNORMAL HIGH (ref 70–99)
Glucose-Capillary: 87 mg/dL (ref 70–99)

## 2022-05-15 LAB — BASIC METABOLIC PANEL
Anion gap: 1 — ABNORMAL LOW (ref 5–15)
BUN: 11 mg/dL (ref 8–23)
CO2: 19 mmol/L — ABNORMAL LOW (ref 22–32)
Calcium: 8 mg/dL — ABNORMAL LOW (ref 8.9–10.3)
Chloride: 120 mmol/L — ABNORMAL HIGH (ref 98–111)
Creatinine, Ser: 0.86 mg/dL (ref 0.61–1.24)
GFR, Estimated: >60 mL/min (ref >60)
Glucose, Bld: 89 mg/dL (ref 70–99)
Potassium: 3.8 mmol/L (ref 3.5–5.1)
Sodium: 140 mmol/L (ref 135–145)

## 2022-05-15 LAB — CBC
HCT: 29.5 % — ABNORMAL LOW (ref 39.0–52.0)
Hemoglobin: 9.6 g/dL — ABNORMAL LOW (ref 13.0–17.0)
MCH: 28.4 pg (ref 26.0–34.0)
MCHC: 32.5 g/dL (ref 30.0–36.0)
MCV: 87.3 fL (ref 80.0–100.0)
Platelets: 188 10*3/uL (ref 150–400)
RBC: 3.38 MIL/uL — ABNORMAL LOW (ref 4.22–5.81)
RDW: 16.6 % — ABNORMAL HIGH (ref 11.5–15.5)
WBC: 7.2 10*3/uL (ref 4.0–10.5)
nRBC: 0 % (ref 0.0–0.2)

## 2022-05-15 LAB — PHOSPHORUS: Phosphorus: 3.3 mg/dL (ref 2.5–4.6)

## 2022-05-15 LAB — ALBUMIN: Albumin: 2.1 g/dL — ABNORMAL LOW (ref 3.5–5.0)

## 2022-05-15 LAB — MAGNESIUM: Magnesium: 1.8 mg/dL (ref 1.7–2.4)

## 2022-05-15 MED ORDER — MAGNESIUM SULFATE 2 GM/50ML IV SOLN
2.0000 g | Freq: Once | INTRAVENOUS | Status: AC
  Administered 2022-05-15: 2 g via INTRAVENOUS
  Filled 2022-05-15: qty 50

## 2022-05-15 MED ORDER — PHENOBARBITAL SODIUM 65 MG/ML IJ SOLN
65.0000 mg | INTRAMUSCULAR | Status: DC
  Administered 2022-05-15: 65 mg via INTRAVENOUS
  Filled 2022-05-15: qty 1

## 2022-05-15 NOTE — Progress Notes (Signed)
PROGRESS NOTE    [name]  TKW:[id number] DOB: 04-11-1941 DOA: 05/02/2022 PCP: Rica Koyanagi, MD   Brief Narrative:  This 81 y.o. male with medical history significant of HTN, HLD, GERD, COPD, CHF (diastolic), seizure disorder, BPH who comes in ED with presumed COPD exacerbation.  Per the EDP he was wheezing on admission and hypoxic.  EMS had to put him on CPAP to get his oxygen saturations up.  His blood gases were essentially normal.  He was weaned down to nasal cannula. CT chest with contrast Filling defects within right lower lobe pulmonary arterial branches compatible with pulmonary emboli.  Patient was admitted for acute pulmonary embolism, started on IV heparin.  Patient has significant risk of aspiration.  Speech and swallow recommended n.p.o. with alternate means of feeding.  Palliative care consulted to discuss goals of care.    Psychiatry consulted due to concern for patient's capacity to make decision and no alternate decision-maker has been identified at this time.  Ethics committee consulted to make a determination since patient has no next of kin.  Psychiatry consulted patient does have a capacity to make decisions.  Patient is agreeable for surgical intervention.    Patient has developed sigmoid volvulus overnight.  General surgery and GI consulted.  Patient underwent emergent colonoscopy with decompression of the volvulus.   He underwent IVC filter for pulmonary embolism , since patient needs to be off anticoagulation for 48 hours.    Patient was taken to OR on 10/4 and underwent Hartmann's procedure.     Assessment & Plan:   Principal Problem:   Pulmonary embolism (HCC) Active Problems:   UTI (urinary tract infection)   Chronic diastolic CHF (congestive heart failure) (HCC)   Seizure disorder (HCC)   Depression   Hypertension   Mixed hyperlipidemia   BPH (benign prostatic hyperplasia)   Normocytic anemia   Hypokalemia   DVT of lower extremity, bilateral  (HCC)   Venous insufficiency of both lower extremities   Pressure injury of skin   COPD exacerbation (HCC)   Malnutrition of moderate degree   Acute deep vein thrombosis (DVT) of left lower extremity (HCC)   Acute respiratory failure with hypoxia (HCC)   Sigmoid volvulus (HCC)   Metabolic acidosis, normal anion gap (NAG)  Acute pulmonary embolism / Acute Right LE DVT:  Patient found to have acute pulmonary embolism and acute Right LE DVT He was started on heparin gtt. Likely primary cause for his respiratory distress and oxygen requirements. Continue supplemental oxygen as needed. Patient underwent IVC filter placement 10/3 in order to be off anticoagulation for surgery. Resumed on Eliquis 10/5.  UTI in the setting of chronic suprapubic catheter: Patient has indwelling suprapubic catheter and urine had a lot of sediment in it. Suprapubic catheter was changed  (05/03/22) It was changed on August 25th 2023 at urology office  Aspiration pneumonia: Completed antibiotics for aspiration pneumonia. Chest x-ray shows improving infiltrate. NG tube inserted for medications and for feeding. Now resumed on diet and NG removed.  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   General surgery and GI consulted.   Patient underwent emergent colonoscopy with decompression of the volvulus with rectal tube.   Definitive treatment would be surgical correction.   Patient is at high risk for volvulus recurrence.  Decompression provides time but not a definite treatment.   Ethics committee consulted to make a determination since patient has no next of kin.  Psych consulted.  Patient does have capacity to make  decisions. Pt underwent Hartmann's procedure on 10/4. WOC consulted for ostomy care. Diet advancement per surgery. Pain control PRN. PT/OT   Hypernatremia: Na 147 due to poor PO intake, not drinking enough water (is on honey-thickened liquids). Mild, hold off D5w for now as  pt developing anasarca. Sodium normalized 142 >> 140 after PO intake improved and given IV lasix Repeat BMP in AM  Hyperkalemia: due to aggressive replacement. Now resolved.  Hypokalemia: Replaced.  Continue to monitor  Normal-anion gap metabolic acidosis -  Due to high ostomy output. -- Stop MiraLAX  --Daily BMP --Start bicarb if still worsening     Chronic diastolic CHF: Lisinopril, carvedilol and Lasix on hold because of low blood pressure. BNP is slightly elevated but chest x-ray does not show fluid overload. --20 mg IV Lasix x 1  --I/O's, daily weights --Monitor renal function, electrolytes   Seizure disorder (HCC) Continue phenobarbital.   Depression Continue Wellbutrin, Abilify.  Hypertension Hold lisinopril, carvedilol because of low blood pressure.   Mixed hyperlipidemia Continue Pravachol.   BPH (benign prostatic hyperplasia) Continue Proscar.   Normocytic anemia Hemoglobin is stable.  Monitor CBC   COPD: Currently no wheezing. Continue inhalers.   Venous insufficiency of both lower extremities Continue Lasix as needed   Dysphagia with high aspiration risk: Speech and swallow evaluation completed, was initially felt very high risk for aspiration & recommended NPO. Completed course of antibiotics for aspiration pneumonia Palliative care consulted to discuss goals of care Temporarily had NG tube for feeding and for medications. Now on dysphagia-3 diet with honey thickened liquids. Remains high risk for recurrent aspiration and infection.  Obesity: Estimated body mass index is 34.63 kg/m as calculated from the following:   Height as of this encounter: 5\' 7"  (1.702 m).   Weight as of this encounter: 100.3 kg.    Pressure injury of skin - POA --I agree with wound descriptions as below. --Monitor closely --Diligent wound care --Reposition pt every 2 hrs --Offload pressure areas  Pressure Injury 05/03/22 Buttocks Right Stage 2 -  Partial  thickness loss of dermis presenting as a shallow open injury with a red, pink wound bed without slough. 1X1cm (Active)  05/03/22 0858  Location: Buttocks  Location Orientation: Right  Staging: Stage 2 -  Partial thickness loss of dermis presenting as a shallow open injury with a red, pink wound bed without slough.  Wound Description (Comments): 1X1cm  Present on Admission: Yes     Pressure Injury 05/03/22 Buttocks Left Stage 2 -  Partial thickness loss of dermis presenting as a shallow open injury with a red, pink wound bed without slough. 1x1cm (Active)  05/03/22 0859  Location: Buttocks  Location Orientation: Left  Staging: Stage 2 -  Partial thickness loss of dermis presenting as a shallow open injury with a red, pink wound bed without slough.  Wound Description (Comments): 1x1cm  Present on Admission: Yes       DVT prophylaxis: Eliquis Code Status: Full code Family Communication: No family at bedside Disposition Plan:   Status is: Inpatient Remains inpatient appropriate because: POD 3 from Hartmann's procedure.   Ongoing metabolic acidosis. Inadequate PO intake. Patient is not medically clear.  Pt is extremely ill, frail, debilitated and malnourished.  He is at high risk of poor outcomes and readmissions.  DSS being contacted about guardianship given no appropriate surrogate decision maker for patient.   Consultants:  General surgery Palliative care Vascular surgery for IVC filter placement  Procedures:    Subjective: Patient  was awake sitting up in bed when seen today, RN changing ostomy bag and cleaning the site.  He is having a lot of liquid ostomy output, surgery okayed stopping MiraLAX.  Other than abdominal pain, patient denies acute complaints.   Objective: Vitals:   05/15/22 0352 05/15/22 0410 05/15/22 0727 05/15/22 1233  BP: 126/67  111/74 122/75  Pulse: 77  81 80  Resp: 17  18 19   Temp: 99.3 F (37.4 C)  99 F (37.2 C) 98.8 F (37.1 C)  TempSrc:  Oral  Oral Oral  SpO2: 99%  99% 100%  Weight:  100.3 kg    Height:        Intake/Output Summary (Last 24 hours) at 05/15/2022 1459 Last data filed at 05/15/2022 1300 Gross per 24 hour  Intake 650 ml  Output 6450 ml  Net -5800 ml   Filed Weights   05/13/22 0519 05/14/22 0630 05/15/22 0410  Weight: 96.8 kg 102.1 kg 100.3 kg    Examination:  General exam: awake sitting up, woke easily to voice, no acute distress Respiratory system: CTAB with diminished bases, no wheezes, normal respiratory effort on room air. Cardiovascular system: normal S1/S2, RRR, no pedal edema.   Gastrointestinal system: RN cleaning ostomy site which appears healing well no surrounding signs of infection or bleeding, lot of gas output from ostomy, appropriate postop tenderness Central nervous system: alert, oriented to self and hospital, no gross focal neurologic deficits, normal speech Extremities: dependent edema of bilateral thighs, off-loading boots on feet, less distal LE edema Skin: dry, intact, normal temperature, pale appearing Psychiatry: normal mood, flat affect    Data Reviewed: I have personally reviewed following labs and imaging studies  Notable labs & studies --- sodium normalized 140, potassium 3.8, chloride improved 120 >> 20, creatinine normal 0.86, albumin 2.1, hemobeen stable 9.6    LOS: 13 days    Time spent: 40 mins    07/15/22, DO [facility]   If 7PM-7AM, please contact night-coverage

## 2022-05-15 NOTE — Consult Note (Signed)
[facility] for Electrolyte Monitoring and Replacement   Recent Labs: Potassium (mmol/L)  Date Value  05/14/2022 4.5  10/03/2012 3.1 (L)   Magnesium (mg/dL)  Date Value  05/14/2022 1.8  10/02/2012 1.3 (L)   Calcium (mg/dL)  Date Value  05/14/2022 8.1 (L)   Calcium, Total (mg/dL)  Date Value  10/03/2012 8.2 (L)   Albumin (g/dL)  Date Value  05/09/2022 2.7 (L)  10/02/2012 3.4   Phosphorus (mg/dL)  Date Value  05/14/2022 2.6   Sodium (mmol/L)  Date Value  05/14/2022 142  10/03/2012 141   Assessment: 81 y.o. male with medical history significant of HTN, HLD, GERD, COPD, CHF (diastolic), seizure disorder, BPH who comes in today with presumed COPD exacerbation. Found to have a PE, started on heparin infusion. Potassium continues to be low.  Goal of Therapy:  Within normal limits  Plan: --Mg 1.8, will give magnesium sulfate 2 g IV x 1 dose --Follow-up electrolytes with AM labs tomorrow. If remain stable, will discontinue electrolyte consult  Benita Gutter  05/15/2022 7:15 AM

## 2022-05-15 NOTE — Assessment & Plan Note (Signed)
Related to chronic illness and debility as evidenced by mild fat and muscle depletion, percent weight loss. --Appreciate dietitian recommendations -- Tolerating dysphagia 3 diet -- On honey thickened liquids -- Started on mighty shakes, Magic cups, multivitamin and vitamin C supplements

## 2022-05-15 NOTE — Assessment & Plan Note (Signed)
Likely due to high output from colostomy. RN reported > 1 L output just today as of mid afternoon.  Otherwise electrolytes have improved and normalized. --Stop Miralax  --Avoid stool softeners --Monitor BMP

## 2022-05-15 NOTE — Progress Notes (Signed)
Patient ID: [name], male   DOB: [birth year], 81 y.o.   MRN: [id number]     [facility] Day(s): 13.   Interval History: Patient seen and examined, no acute events or new complaints overnight. Patient reports feeling well.  He endorses that he is waiting for the breakfast to get here.  Otherwise he has no complaint.  He denies any pain.  He denies any nausea or vomiting.  There is great output in the colostomy bag.  Vital signs in last 24 hours: [min-max] current  Temp:  [97.5 F (36.4 C)-99.3 F (37.4 C)] 99 F (37.2 C) (10/07 0727) Pulse Rate:  [77-84] 81 (10/07 0727) Resp:  [17-20] 18 (10/07 0727) BP: (111-162)/(57-75) 111/74 (10/07 0727) SpO2:  [97 %-100 %] 99 % (10/07 0727) Weight:  [100.3 kg] 100.3 kg (10/07 0410)     Height: 5\' 7"  (170.2 cm) Weight: 100.3 kg BMI (Calculated): 34.62   Physical Exam:  Constitutional: alert, cooperative and no distress  Respiratory: breathing non-labored at rest  Cardiovascular: regular rate and sinus rhythm  Gastrointestinal: soft, non-tender, and non-distended  Labs:     Latest Ref Rng & Units 05/15/2022    6:49 AM 05/14/2022    4:29 AM 05/13/2022    4:05 AM  CBC  WBC 4.0 - 10.5 K/uL 7.2  7.6  8.2   Hemoglobin 13.0 - 17.0 g/dL 9.6  9.1  10.7   Hematocrit 39.0 - 52.0 % 29.5  28.6  32.7   Platelets 150 - 400 K/uL 188  198  224       Latest Ref Rng & Units 05/15/2022    6:49 AM 05/14/2022    6:14 PM 05/14/2022    4:29 AM  CMP  Glucose 70 - 99 mg/dL 89  98  114   BUN 8 - 23 mg/dL 11  11  11    Creatinine 0.61 - 1.24 mg/dL 0.86  1.01  1.08   Sodium 135 - 145 mmol/L 140  142  147   Potassium 3.5 - 5.1 mmol/L 3.8  4.5  5.2   Chloride 98 - 111 mmol/L 120  124  129   CO2 22 - 32 mmol/L 19  20  16    Calcium 8.9 - 10.3 mg/dL 8.0  8.1  8.1     Imaging studies: No new pertinent imaging studies   Assessment/Plan:  81 y.o. male with sigmoid volvulus 3 Day Post-Op s/p sigmoid colectomy and end colostomy creation.    -Patient seems to be very comfortable this morning.  He was very alert and communicative. -Adequate vital signs -He seems to be tolerating diet well.  There is adequate colostomy output. -No further surgical management needed at this moment -Patient will need to have colostomy care coordinated for discharge -Continue medical management as per primary team -We will continue to follow peripherally  Arnold Long, MD

## 2022-05-15 NOTE — Plan of Care (Signed)

## 2022-05-16 DIAGNOSIS — I2699 Other pulmonary embolism without acute cor pulmonale: Secondary | ICD-10-CM | POA: Diagnosis not present

## 2022-05-16 DIAGNOSIS — I5032 Chronic diastolic (congestive) heart failure: Secondary | ICD-10-CM | POA: Diagnosis not present

## 2022-05-16 DIAGNOSIS — K562 Volvulus: Secondary | ICD-10-CM | POA: Diagnosis not present

## 2022-05-16 LAB — BASIC METABOLIC PANEL
Anion gap: 6 (ref 5–15)
BUN: 12 mg/dL (ref 8–23)
CO2: 20 mmol/L — ABNORMAL LOW (ref 22–32)
Calcium: 8 mg/dL — ABNORMAL LOW (ref 8.9–10.3)
Chloride: 115 mmol/L — ABNORMAL HIGH (ref 98–111)
Creatinine, Ser: 0.95 mg/dL (ref 0.61–1.24)
GFR, Estimated: >60 mL/min (ref >60)
Glucose, Bld: 92 mg/dL (ref 70–99)
Potassium: 3.6 mmol/L (ref 3.5–5.1)
Sodium: 141 mmol/L (ref 135–145)

## 2022-05-16 LAB — MAGNESIUM: Magnesium: 2.1 mg/dL (ref 1.7–2.4)

## 2022-05-16 LAB — GLUCOSE, CAPILLARY
Glucose-Capillary: 109 mg/dL — ABNORMAL HIGH (ref 70–99)
Glucose-Capillary: 122 mg/dL — ABNORMAL HIGH (ref 70–99)
Glucose-Capillary: 122 mg/dL — ABNORMAL HIGH (ref 70–99)
Glucose-Capillary: 74 mg/dL (ref 70–99)
Glucose-Capillary: 94 mg/dL (ref 70–99)
Glucose-Capillary: 99 mg/dL (ref 70–99)

## 2022-05-16 MED ORDER — FUROSEMIDE 20 MG PO TABS
20.0000 mg | ORAL_TABLET | Freq: Two times a day (BID) | ORAL | Status: DC
  Administered 2022-05-16 – 2022-05-17 (×2): 20 mg via ORAL
  Filled 2022-05-16 (×2): qty 1

## 2022-05-16 MED ORDER — PHENOBARBITAL 32.4 MG PO TABS
64.8000 mg | ORAL_TABLET | ORAL | Status: DC
  Administered 2022-05-17: 64.8 mg via ORAL
  Filled 2022-05-16: qty 2

## 2022-05-16 MED ORDER — ORAL CARE MOUTH RINSE
15.0000 mL | OROMUCOSAL | Status: DC
  Administered 2022-05-17 (×2): 15 mL via OROMUCOSAL

## 2022-05-16 MED ORDER — POTASSIUM CHLORIDE CRYS ER 20 MEQ PO TBCR
20.0000 meq | EXTENDED_RELEASE_TABLET | Freq: Two times a day (BID) | ORAL | Status: DC
  Administered 2022-05-16 – 2022-05-17 (×2): 20 meq via ORAL
  Filled 2022-05-16 (×2): qty 1

## 2022-05-16 MED ORDER — ORAL CARE MOUTH RINSE: 15.0000 mL | OROMUCOSAL | Status: DC | PRN

## 2022-05-16 MED ORDER — POTASSIUM CHLORIDE CRYS ER 20 MEQ PO TBCR
20.0000 meq | EXTENDED_RELEASE_TABLET | Freq: Once | ORAL | Status: AC
  Administered 2022-05-16: 20 meq via ORAL
  Filled 2022-05-16: qty 1

## 2022-05-16 NOTE — Evaluation (Signed)
Physical Therapy Evaluation Patient Details Name: [name] MRN: [id number] DOB: 16-Feb-1941 Today's Date: 05/16/2022  History of Present Illness  Pt is a 81 y/o M admitted on 05/02/22 after presenting to the ED with presumed COPD exacerbation. Pt then found to have acute PE & acute RLE DVT. Pt also underwent emergent colonoscopy with decompression of volvulus after developing sigmoid volvulus on 10/1. Pt also had IVC filter placed for PE. On 10/4 pt underwent Hartmann's procedure. PMH: HTN, HLD, GERD, COPD, diastolic CHF, seizure disorder, BPH  Clinical Impression  Pt seen for PT evaluation with co-tx with OT. Pt is AxOx3 & follows simple commands inconsistently with extra time. Pt reports prior to admission he performed transfers to w/c and primarily used a w/c for mobility. On this date, pt requires max assist +2 for supine<>sit & min assist for static sitting. Pt demonstrates R lateral lean in sitting but reports this is not pre-existing (? Reliability) and MD notified. Pt attempted STS from EOB with RW but was only able to clear buttocks from EOB & not fully stand up. Pt returned to bed & left with all needs at hand. Pt would benefit from STR upon d/c to maximize independence with functional mobility & reduce fall risk prior to return home.    Recommendations for follow up therapy are one component of a multi-disciplinary discharge planning process, led by the attending physician.  Recommendations may be updated based on patient status, additional functional criteria and insurance authorization.  Follow Up Recommendations Skilled nursing-short term rehab (<3 hours/day) Can patient physically be transported by private vehicle: No    Assistance Recommended at Discharge Frequent or constant Supervision/Assistance  Patient can return home with the following  Two people to help with walking and/or transfers;Two people to help with bathing/dressing/bathroom;Assist for transportation     Equipment Recommendations None recommended by PT  Recommendations for Other Services       Functional Status Assessment Patient has had a recent decline in their functional status and demonstrates the ability to make significant improvements in function in a reasonable and predictable amount of time.     Precautions / Restrictions Precautions Precautions: Fall  Precaution Comments: new ostomy Restrictions Weight Bearing Restrictions: No      Mobility  Bed Mobility Overal bed mobility: Needs Assistance Bed Mobility: Supine to Sit, Sit to Supine     Supine to sit: Max assist, +2 for physical assistance, HOB elevated Sit to supine: Max assist, +2 for physical assistance, HOB elevated        Transfers                   General transfer comment: Attempted STS from EOB with RW but pt only able to clear buttocks from EOB with max assist +2 and unable to come to full upright standing.    Ambulation/Gait                  Stairs            Wheelchair Mobility    Modified Rankin (Stroke Patients Only)       Balance Overall balance assessment: Needs assistance Sitting-balance support: Feet supported, Bilateral upper extremity supported Sitting balance-Leahy Scale: Poor Sitting balance - Comments: pt reports R lateral lean is not pre-existing, cuing but poor ability to correct to midline orientation without assistance. Postural control: Right lateral lean  Pertinent Vitals/Pain      Home Living Family/patient expects to be discharged to:: Skilled nursing facility (Long Term Care)                   Additional Comments: [facility]    Prior Function Prior Level of Function : Needs assist             Mobility Comments: Pt reports he required assistance for stand pivot transfers to w/c & primarily used w/c for mobility. ADLs Comments: Pt endorses assist for ADL/IADL     Hand  Dominance   Dominant Hand: Right    Extremity/Trunk Assessment   Upper Extremity Assessment Upper Extremity Assessment: Generalized weakness     Lower Extremity Assessment Lower Extremity Assessment: Generalized weakness (2/5 BLE knee extension), L foot wrapped in gauze    Cervical / Trunk Assessment Cervical / Trunk Assessment: Kyphotic (rounded shoulders, forward head)  Communication   Communication: Expressive difficulties  Cognition                                       General Comments: Pt oriented to location, month, year & self. Unsure of reason re: hospitalization. Follows simple commands inconsistently with extra time.        General Comments     Exercises     Assessment/Plan    PT Assessment Patient needs continued PT services  PT Problem List Decreased strength;Decreased range of motion;Decreased activity tolerance;Decreased balance;Decreased mobility;Decreased safety awareness;Decreased knowledge of use of DME;Decreased skin integrity;Decreased cognition       PT Treatment Interventions Therapeutic exercise;DME instruction;Gait training;Balance training;Stair training;Neuromuscular re-education;Functional mobility training;Therapeutic activities;Patient/family education;Cognitive remediation;Modalities;Wheelchair mobility training;Manual techniques    PT Goals (Current goals can be found in the Care Plan section)  Acute Rehab PT Goals Patient Stated Goal: decreased pain PT Goal Formulation: With patient Time For Goal Achievement: 05/30/22 Potential to Achieve Goals: Good    Frequency Min 2X/week     Co-evaluation PT/OT/SLP Co-Evaluation/Treatment: Yes Reason for Co-Treatment: Complexity of the patient's impairments (multi-system involvement);For patient/therapist safety;Necessary to address cognition/behavior during functional activity PT goals addressed during session: Mobility/safety with mobility;Proper use of DME;Balance OT goals  addressed during session: ADL's and self-care       AM-PAC PT "6 Clicks" Mobility  Outcome Measure Help needed turning from your back to your side while in a flat bed without using bedrails?: A Lot Help needed moving from lying on your back to sitting on the side of a flat bed without using bedrails?: Total Help needed moving to and from a bed to a chair (including a wheelchair)?: Total Help needed standing up from a chair using your arms (e.g., wheelchair or bedside chair)?: Total Help needed to walk in hospital room?: Total Help needed climbing 3-5 steps with a railing? : Total 6 Click Score: 7    End of Session Equipment Utilized During Treatment:  (colostomy bag requiring burping) Activity Tolerance: Patient tolerated treatment well Patient left: in bed;with call bell/phone within reach;with bed alarm set (prevalon boots donned on BLE) Nurse Communication: Mobility status PT Visit Diagnosis: Other abnormalities of gait and mobility (R26.89);Muscle weakness (generalized) (M62.81)    Time: 2694-8546 PT Time Calculation (min) (ACUTE ONLY): 18 min   Charges:   PT Evaluation $PT Eval Moderate Complexity: 1 Mod          Aleda Grana, PT, DPT 05/16/22, 1:53 PM   [place]  Paul Half 05/16/2022, 1:50 PM

## 2022-05-16 NOTE — Consult Note (Signed)
[facility] for Electrolyte Monitoring and Replacement   Recent Labs: Potassium (mmol/L)  Date Value  05/16/2022 3.6  10/03/2012 3.1 (L)   Magnesium (mg/dL)  Date Value  05/16/2022 2.1  10/02/2012 1.3 (L)   Calcium (mg/dL)  Date Value  05/16/2022 8.0 (L)   Calcium, Total (mg/dL)  Date Value  10/03/2012 8.2 (L)   Albumin (g/dL)  Date Value  05/15/2022 2.1 (L)  10/02/2012 3.4   Phosphorus (mg/dL)  Date Value  05/15/2022 3.3   Sodium (mmol/L)  Date Value  05/16/2022 141  10/03/2012 141   Assessment: 81 y.o. male with medical history significant of HTN, HLD, GERD, COPD, CHF (diastolic), seizure disorder, BPH who comes in today with presumed COPD exacerbation. Found to have a PE started apixaban.Potassium improving.   Goal of Therapy:  Within normal limits  Plan: --Will give KCL 20 mEq x 1.  --Follow-up electrolytes with AM labs tomorrow. If remain stable, will discontinue electrolyte consult  Oswald Hillock, PharmD, BCPS  05/16/2022 8:19 AM

## 2022-05-16 NOTE — Evaluation (Signed)
Occupational Therapy Evaluation Patient Details Name: [name] MRN: [id number] DOB: 16-Nov-1940 Today's Date: 05/16/2022   History of Present Illness Pt is a 81 y/o M admitted on 05/02/22 after presenting to the ED with presumed COPD exacerbation. Pt then found to have acute PE & acute RLE DVT. Pt also underwent emergent colonoscopy with decompression of volvulus after developing sigmoid volvulus on 10/1. Pt also had IVC filter placed for PE. On 10/4 pt underwent Hartmann's procedure. PMH: HTN, HLD, GERD, COPD, diastolic CHF, seizure disorder, BPH   Clinical Impression   Chart reviewed, pt greeted in bed, oriented to self, place, date, not oriented to situation. Co tx completed with PT on thsi date. Pt endorses he required assistance for all ADL/IADL PTA, transferred to mwc with staff assist, completed MRADLs at wheelchair level. Pt requires MAX A +2 for bed mobility, MAX A +2 for STS with pt unable to clear buttocks. MOD A required for  grooming tasks seated on edge of bed. Pt with poor sitting balance- R lateral lean noted throughout requiring at least MIN A for all static sitting tasks. Pt presents with deficits in strength, endurance, activity tolerance, balance, all affecting safe and optimal ADL completion. Recommend discharge to STR to address deficits. OT will continue to follow acutely.      Recommendations for follow up therapy are one component of a multi-disciplinary discharge planning process, led by the attending physician.  Recommendations may be updated based on patient status, additional functional criteria and insurance authorization.   Follow Up Recommendations  Skilled nursing-short term rehab (<3 hours/day)    Assistance Recommended at Discharge Frequent or constant Supervision/Assistance  Patient can return home with the following A lot of help with walking and/or transfers;A lot of help with bathing/dressing/bathroom    Functional Status Assessment  Patient has had  a recent decline in their functional status and demonstrates the ability to make significant improvements in function in a reasonable and predictable amount of time.  Equipment Recommendations  Other (comment) (per next venue of care)    Recommendations for Other Services       Precautions / Restrictions Precautions Precautions: Fall (Simultaneous filing. User may not have seen previous data.) Precaution Comments: new ostomy Restrictions Weight Bearing Restrictions: No (Simultaneous filing. User may not have seen previous data.)      Mobility Bed Mobility Overal bed mobility: Needs Assistance Bed Mobility: Supine to Sit, Sit to Supine     Supine to sit: Max assist, +2 for physical assistance Sit to supine: Max assist, +2 for physical assistance        Transfers Overall transfer level: Needs assistance Equipment used: Rolling walker (2 wheels) Transfers: Sit to/from Stand Sit to Stand: +2 physical assistance, Max assist           General transfer comment: unable to clear buttocks off bed despite step by step vcs for technique/safety      Balance Overall balance assessment: Needs assistance (Simultaneous filing. User may not have seen previous data.) Sitting-balance support: Feet supported (Simultaneous filing. User may not have seen previous data.) Sitting balance-Leahy Scale: Poor (Simultaneous filing. User may not have seen previous data.) Sitting balance - Comments: requires at least MIN A for seated at edge of bed static tasks (Simultaneous filing. User may not have seen previous data.) Postural control: Right lateral lean (Simultaneous filing. User may not have seen previous data.) Standing balance support: Bilateral upper extremity supported Standing balance-Leahy Scale: Zero  ADL either performed or assessed with clinical judgement   ADL Overall ADL's : Needs assistance/impaired                                        General ADL Comments: TOTAL A for LB dressing, MOD A for grooming at edge of bed     Vision Patient Visual Report: No change from baseline       Perception     Praxis      Pertinent Vitals/Pain Pain Assessment Pain Assessment: Faces (Simultaneous filing. User may not have seen previous data.) Faces Pain Scale: Hurts little more (Simultaneous filing. User may not have seen previous data.) Pain Location: abdomen (Simultaneous filing. User may not have seen previous data.) Pain Descriptors / Indicators: Discomfort (Simultaneous filing. User may not have seen previous data.) Pain Intervention(s): Limited activity within patient's tolerance, Monitored during session, Repositioned (notified RN  Simultaneous filing. User may not have seen previous data.)     Hand Dominance Right   Extremity/Trunk Assessment Upper Extremity Assessment Upper Extremity Assessment: Generalized weakness (Simultaneous filing. User may not have seen previous data.)   Lower Extremity Assessment Lower Extremity Assessment: Generalized weakness (2/5 BLE knee extension  Simultaneous filing. User may not have seen previous data.)   Cervical / Trunk Assessment Cervical / Trunk Assessment: Kyphotic (rounded shoulders, forward head  Simultaneous filing. User may not have seen previous data.)   Communication Communication Communication: Expressive difficulties   Cognition Arousal/Alertness: Awake/alert (Simultaneous filing. User may not have seen previous data.) Behavior During Therapy: Flat affect (Simultaneous filing. User may not have seen previous data.) Overall Cognitive Status: No family/caregiver present to determine baseline cognitive functioning (Simultaneous filing. User may not have seen previous data.) Area of Impairment: Orientation, Attention, Following commands, Safety/judgement, Awareness, Problem solving                 Orientation Level: Disoriented to, Situation Current  Attention Level: Sustained   Following Commands: Follows one step commands with increased time   Awareness: Intellectual Problem Solving: Slow processing, Decreased initiation, Difficulty sequencing, Requires verbal cues, Requires tactile cues       General Comments  PRAFO boots donned at end of session, alll lines/leads intact    Exercises     Shoulder Instructions      Home Living Family/patient expects to be discharged to:: Skilled nursing facility (Long Term Care)                                 Additional Comments: [facility]      Prior Functioning/Environment Prior Level of Function : Needs assist             Mobility Comments: Pt reports he required assistance for stand pivot transfers to w/c & primarily used w/c for mobility. ADLs Comments: Pt endorses assist for ADL/IADL        OT Problem List: Decreased strength;Decreased activity tolerance;Decreased knowledge of use of DME or AE;Decreased safety awareness      OT Treatment/Interventions: Self-care/ADL training;Therapeutic activities;Therapeutic exercise;Energy conservation;Patient/family education    OT Goals(Current goals can be found in the care plan section) Acute Rehab OT Goals Patient Stated Goal: feel better OT Goal Formulation: With patient Time For Goal Achievement: 05/30/22 Potential to Achieve Goals: Fair ADL Goals Pt Will Perform Grooming: with min assist;sitting Pt Will Perform  Lower Body Dressing: with mod assist;sitting/lateral leans Pt Will Transfer to Toilet: with mod assist Pt Will Perform Toileting - Clothing Manipulation and hygiene: with mod assist  OT Frequency: Min 2X/week    Co-evaluation PT/OT/SLP Co-Evaluation/Treatment: Yes Reason for Co-Treatment: Complexity of the patient's impairments (multi-system involvement);For patient/therapist safety;Necessary to address cognition/behavior during functional activity PT goals addressed during session:  Mobility/safety with mobility;Proper use of DME;Balance OT goals addressed during session: ADL's and self-care      AM-PAC OT "6 Clicks" Daily Activity     Outcome Measure Help from another person eating meals?: A Lot Help from another person taking care of personal grooming?: A Lot Help from another person toileting, which includes using toliet, bedpan, or urinal?: Total Help from another person bathing (including washing, rinsing, drying)?: Total Help from another person to put on and taking off regular upper body clothing?: Total Help from another person to put on and taking off regular lower body clothing?: A Lot 6 Click Score: 9   End of Session Equipment Utilized During Treatment: Rolling walker (2 wheels) Nurse Communication: Mobility status  Activity Tolerance: Patient tolerated treatment well Patient left: in bed;with call bell/phone within reach;with bed alarm set  OT Visit Diagnosis: Unsteadiness on feet (R26.81);Muscle weakness (generalized) (M62.81)                Time: 7408-1448 OT Time Calculation (min): 18 min Charges:  OT General Charges $OT Visit: 1 Visit OT Evaluation $OT Eval Moderate Complexity: 1 Mod Shanon Payor, OTD OTR/L  05/16/22, 1:51 PM

## 2022-05-16 NOTE — TOC Progression Note (Signed)
Transition of Care [facility]) - Progression Note    Patient Details  Name: RAIMUNDO CORBIT MRN: [id number] Date of Birth: Dec 22, 1940  Transition of [facility] CM/SW Contact  Zigmund Daniel Dorian Pod, RN Phone Number:[phone] 05/16/2022, 9:57 AM  Clinical Narrative:    Requested discharge disposition. Unsuccessful outreach to [place] for Tam[facility] in admission however spoke with floor where pt resides [place] [address] Gabriel Cirri who requested to call [facility](Monday) for pt's return to LTC.  TOC will continue to follow up accordingly.  Expected Discharge Plan: [place] Barriers to Discharge: Continued Medical Work up  Expected Discharge Plan and Services Expected Discharge Plan: [place] Acute Care Choice: Resumption of Svcs/PTA Provider Living arrangements for the past 2 months: [place]                                       Social Determinants of Health (SDOH) Interventions    Readmission Risk Interventions    05/04/2022   12:15 PM  Readmission Risk Prevention Plan  Transportation Screening Complete  Medication Review (RN Care Manager) Complete  PCP or Specialist appointment within 3-5 days of discharge Complete  SW Recovery Care/Counseling Consult Complete  [place] Complete

## 2022-05-16 NOTE — Progress Notes (Signed)
PROGRESS NOTE    [name]  TOI:[id number] DOB: [birth year] DOA: 05/02/2022 PCP: Rosetta Posner, MD   Brief Narrative:  This 81 y.o. male with medical history significant of HTN, HLD, GERD, COPD, CHF (diastolic), seizure disorder, BPH who comes in ED with presumed COPD exacerbation.  Per the EDP he was wheezing on admission and hypoxic.  EMS had to put him on CPAP to get his oxygen saturations up.  His blood gases were essentially normal.  He was weaned down to nasal cannula. CT chest with contrast Filling defects within right lower lobe pulmonary arterial branches compatible with pulmonary emboli.  Patient was admitted for acute pulmonary embolism, started on IV heparin.  Patient has significant risk of aspiration.  Speech and swallow recommended n.p.o. with alternate means of feeding.  Palliative care consulted to discuss goals of care.    Psychiatry consulted due to concern for patient's capacity to make decision and no alternate decision-maker has been identified at this time.  Ethics committee consulted to make a determination since patient has no next of kin.  Psychiatry consulted patient does have a capacity to make decisions.  Patient is agreeable for surgical intervention.    Patient has developed sigmoid volvulus overnight.  General surgery and GI consulted.  Patient underwent emergent colonoscopy with decompression of the volvulus.   He underwent IVC filter for pulmonary embolism , since patient needs to be off anticoagulation for 48 hours.    Patient was taken to OR on 10/4 and underwent Hartmann's procedure.     Assessment & Plan:   Principal Problem:   Pulmonary embolism (HCC) Active Problems:   UTI (urinary tract infection)   Chronic diastolic CHF (congestive heart failure) (HCC)   Seizure disorder (HCC)   Depression   Hypertension   Mixed hyperlipidemia   BPH (benign prostatic hyperplasia)   Normocytic anemia   Hypokalemia   DVT of lower extremity, bilateral  (HCC)   Venous insufficiency of both lower extremities   Pressure injury of skin   COPD exacerbation (HCC)   Malnutrition of moderate degree   Acute deep vein thrombosis (DVT) of left lower extremity (HCC)   Acute respiratory failure with hypoxia (HCC)   Sigmoid volvulus (HCC)   Metabolic acidosis, normal anion gap (NAG)  Acute pulmonary embolism / Acute Right LE DVT:  Patient found to have acute pulmonary embolism and acute Right LE DVT He was started on heparin gtt. Likely primary cause for his respiratory distress and oxygen requirements. Continue supplemental oxygen as needed. Patient underwent IVC filter placement 10/3 in order to be off anticoagulation for surgery. Resumed on Eliquis 10/5.  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   General surgery and GI consulted.   Patient underwent emergent colonoscopy with decompression of the volvulus with rectal tube.   Definitive treatment would be surgical correction.   Patient is at high risk for volvulus recurrence.  Decompression provides time but not a definite treatment.   Ethics committee consulted to make a determination since patient has no next of kin.  Psych consulted.  Patient does have capacity to make decisions. Pt underwent Hartmann's procedure on 10/4. WOC consulted for ostomy care. Diet advancement per surgery. Pain control PRN. PT/OT   Hypernatremia: Na 147 due to poor PO intake, not drinking enough water (is on honey-thickened liquids). Mild, hold off D5w for now as pt developing anasarca. Sodium normalized 142 >> 140 after PO intake improved and given IV lasix Repeat BMP in AM  Hyperkalemia:  due to aggressive replacement. Now resolved.  Hypokalemia: Replaced.  Continue to monitor  Normal-anion gap metabolic acidosis -  Due to high ostomy output. -- Stop MiraLAX  --Daily BMP --Start bicarb if still worsening     Chronic diastolic CHF: Lisinopril, carvedilol and Lasix on hold  because of low blood pressure. BNP is slightly elevated but chest x-ray does not show fluid overload. --20 mg IV Lasix x 1 given 10/6 --Resume home Lasix 20 mg PO BID --I/O's, daily weights --Monitor renal function, electrolytes  UTI in the setting of chronic suprapubic catheter: Resolved Patient has indwelling suprapubic catheter and urine had a lot of sediment in it. Suprapubic catheter was changed  (05/03/22) It was changed on August 25th 2023 at urology office  Aspiration pneumonia: Resolved Completed antibiotics for aspiration pneumonia. Chest x-ray shows improving infiltrate. NG tube inserted for medications and for feeding. Now resumed on diet and NG removed.  Dysphagia with high aspiration risk: Speech and swallow evaluation completed, was initially felt very high risk for aspiration & recommended NPO. Completed course of antibiotics for aspiration pneumonia Palliative care consulted to discuss goals of care Temporarily had NG tube for feeding and for medications. Now on dysphagia-3 diet with honey thickened liquids. Remains high risk for recurrent aspiration and infection.   Seizure disorder (HCC) Continue phenobarbital.   Depression Continue Wellbutrin, Abilify.  Hypertension Hold lisinopril, carvedilol because of low blood pressure.   Mixed hyperlipidemia Continue Pravachol.   BPH (benign prostatic hyperplasia) Continue Proscar.   Normocytic anemia Hemoglobin is stable.  Monitor CBC   COPD: Currently no wheezing. Continue inhalers.   Venous insufficiency of both lower extremities Continue Lasix as needed   Obesity: Estimated body mass index is 34.63 kg/m as calculated from the following:   Height as of this encounter: 5\' 7"  (1.702 m).   Weight as of this encounter: 100.3 kg.    Pressure injury of skin - POA --I agree with wound descriptions as below. --Monitor closely --Diligent wound care --Reposition pt every 2 hrs --Offload pressure  areas  Pressure Injury 05/03/22 Buttocks Right Stage 2 -  Partial thickness loss of dermis presenting as a shallow open injury with a red, pink wound bed without slough. 1X1cm (Active)  05/03/22 0858  Location: Buttocks  Location Orientation: Right  Staging: Stage 2 -  Partial thickness loss of dermis presenting as a shallow open injury with a red, pink wound bed without slough.  Wound Description (Comments): 1X1cm  Present on Admission: Yes     Pressure Injury 05/03/22 Buttocks Left Stage 2 -  Partial thickness loss of dermis presenting as a shallow open injury with a red, pink wound bed without slough. 1x1cm (Active)  05/03/22 0859  Location: Buttocks  Location Orientation: Left  Staging: Stage 2 -  Partial thickness loss of dermis presenting as a shallow open injury with a red, pink wound bed without slough.  Wound Description (Comments): 1x1cm  Present on Admission: Yes       DVT prophylaxis: Eliquis Code Status: Full code Family Communication: No family at bedside Disposition Plan:   Status is: Inpatient Remains inpatient appropriate because:  To return to SNF/LTC, facility unable to admit this weekend. Now medically stable for d/c.  Pt is extremely ill, frail, debilitated and malnourished.  He is at high risk of poor outcomes and readmissions.  DSS being contacted about guardianship given no appropriate surrogate decision maker for patient.   Consultants:  General surgery Palliative care Vascular surgery for IVC filter  placement - follow up outpatient  Procedures:    Subjective: Patient was awake sitting up in bed when seen today.  Reports moderate abdominal pain.  No other acute complaints.  Says he needs his clothes before he can return to his facility.     Objective: Vitals:   05/16/22 1000 05/16/22 1300 05/16/22 1715 05/16/22 1919  BP: 137/63 126/67 125/68 (!) 112/59  Pulse: 81 75 70 78  Resp: 14 17 14 20   Temp: 98.6 F (37 C) 98.5 F (36.9 C) 98.3 F  (36.8 C) 98.5 F (36.9 C)  TempSrc: Oral Oral Oral Oral  SpO2: 100% 99% 100% 98%  Weight:      Height:        Intake/Output Summary (Last 24 hours) at 05/16/2022 1925 Last data filed at 05/16/2022 1715 Gross per 24 hour  Intake --  Output 1635 ml  Net -1635 ml   Filed Weights   05/13/22 0519 05/14/22 0630 05/15/22 0410  Weight: 96.8 kg 102.1 kg 100.3 kg    Examination:  General exam: awake sitting up, no acute distress, obese HEENT: hearing grossly normal, edentulus Respiratory system: normal respiratory effort on room air. Cardiovascular system: brisk cap refill, intact pedal pulses.   Gastrointestinal system: liquid stool and gas in colostomy bag, appropriate postop tenderness Central nervous system: alert, oriented to self and hospital, no gross focal neurologic deficits, normal speech Extremities: dependent edema of bilateral thighs, off-loading boots on feet, less distal LE edema Skin: dry, intact, normal temperature, pale appearing Psychiatry: normal mood, flat affect    Data Reviewed: I have personally reviewed following labs and imaging studies  Notable labs & studies --- normal Na, K, Mg, renal function. Cl 115, bicarb 20 - both improving     LOS: 14 days    Time spent: 40 mins    Ezekiel Slocumb, DO [facility]   If 7PM-7AM, please contact night-coverage

## 2022-05-17 DIAGNOSIS — I2699 Other pulmonary embolism without acute cor pulmonale: Secondary | ICD-10-CM | POA: Diagnosis not present

## 2022-05-17 DIAGNOSIS — K562 Volvulus: Secondary | ICD-10-CM | POA: Diagnosis not present

## 2022-05-17 DIAGNOSIS — E440 Moderate protein-calorie malnutrition: Secondary | ICD-10-CM | POA: Diagnosis not present

## 2022-05-17 LAB — GLUCOSE, CAPILLARY
Glucose-Capillary: 80 mg/dL (ref 70–99)
Glucose-Capillary: 109 mg/dL — ABNORMAL HIGH (ref 70–99)
Glucose-Capillary: 114 mg/dL — ABNORMAL HIGH (ref 70–99)

## 2022-05-17 LAB — CBC
HCT: 28.7 % — ABNORMAL LOW (ref 39.0–52.0)
Hemoglobin: 9.2 g/dL — ABNORMAL LOW (ref 13.0–17.0)
MCH: 27.7 pg (ref 26.0–34.0)
MCHC: 32.1 g/dL (ref 30.0–36.0)
MCV: 86.4 fL (ref 80.0–100.0)
Platelets: 200 10*3/uL (ref 150–400)
RBC: 3.32 MIL/uL — ABNORMAL LOW (ref 4.22–5.81)
RDW: 15.7 % — ABNORMAL HIGH (ref 11.5–15.5)
WBC: 6.8 10*3/uL (ref 4.0–10.5)
nRBC: 0 % (ref 0.0–0.2)

## 2022-05-17 LAB — BASIC METABOLIC PANEL
Anion gap: 4 — ABNORMAL LOW (ref 5–15)
BUN: 17 mg/dL (ref 8–23)
CO2: 20 mmol/L — ABNORMAL LOW (ref 22–32)
Calcium: 8 mg/dL — ABNORMAL LOW (ref 8.9–10.3)
Chloride: 114 mmol/L — ABNORMAL HIGH (ref 98–111)
Creatinine, Ser: 1.01 mg/dL (ref 0.61–1.24)
GFR, Estimated: >60 mL/min (ref >60)
Glucose, Bld: 93 mg/dL (ref 70–99)
Potassium: 3.7 mmol/L (ref 3.5–5.1)
Sodium: 138 mmol/L (ref 135–145)

## 2022-05-17 MED ORDER — LACTULOSE 10 GM/15ML PO SOLN: 10.0000 g | Freq: Four times a day (QID) | ORAL | 0 refills | Status: DC | PRN

## 2022-05-17 MED ORDER — GUAIFENESIN 100 MG/5ML PO LIQD: 5.0000 mL | ORAL | 0 refills | Status: DC | PRN

## 2022-05-17 MED ORDER — FUROSEMIDE 20 MG PO TABS: 20.0000 mg | ORAL_TABLET | Freq: Two times a day (BID) | ORAL | Status: DC

## 2022-05-17 MED ORDER — UMECLIDINIUM BROMIDE 62.5 MCG/ACT IN AEPB: 1.0000 | INHALATION_SPRAY | Freq: Every day | RESPIRATORY_TRACT | Status: DC

## 2022-05-17 MED ORDER — APIXABAN 5 MG PO TABS: 5.0000 mg | ORAL_TABLET | Freq: Two times a day (BID) | ORAL | Status: DC

## 2022-05-17 MED ORDER — FLUTICASONE FUROATE-VILANTEROL 100-25 MCG/ACT IN AEPB: 1.0000 | INHALATION_SPRAY | Freq: Every day | RESPIRATORY_TRACT | Status: DC

## 2022-05-17 MED ORDER — POTASSIUM CHLORIDE CRYS ER 20 MEQ PO TBCR: 20.0000 meq | EXTENDED_RELEASE_TABLET | Freq: Two times a day (BID) | ORAL | Status: DC

## 2022-05-17 MED ORDER — POLYETHYLENE GLYCOL 3350 17 G PO PACK: 17.0000 g | PACK | Freq: Every day | ORAL | 0 refills | Status: DC | PRN

## 2022-05-17 MED ORDER — HYDROCODONE-ACETAMINOPHEN 5-325 MG PO TABS: 1.0000 | ORAL_TABLET | ORAL | 0 refills | Status: DC | PRN

## 2022-05-17 NOTE — Progress Notes (Signed)
Palliative: Chart review completed.  [name] is to return to [facility], his long-term care facility, today.  Anticipate continued declines in the future.  Would benefit from outpatient palliative services.  Clearly needs a surrogate decision maker for when he is unable.  DSS peripherally involved.  Plan: At this point full scope/full code.  Return to long-term care facility.  Anticipate multiple rehospitalizations.   No charge [name], NP Palliative medicine team Team phone [phone] Greater than 50% of this time was spent counseling and coordinating care related to the above assessment and plan.

## 2022-05-17 NOTE — Care Management Important Message (Signed)
Important Message  Patient Details  Name: [name] MRN: [id number] Date of Birth: Jul 14, 1941   Medicare Important Message Given:  Other (see comment)  Attempted to reach patient via room phone due to isolation status to review Medicare IM.  Unable to reach at this time.   Dannette Barbara 05/17/2022, 11:49 AM

## 2022-05-17 NOTE — Consult Note (Signed)
[place] Nurse ostomy consult note Pt had colostomy surgery performed 10/4. He does not appear to understand when ostomy care was discussed and no family members are present.  Stoma type/location: Stoma is red more edematous, above skin level, 1 3/4 inches Peristomal assessment: intact skin surrounding Output: 50cc liquid brown stool Ostomy pouching: 2pc.  Applied barrier ring to attempt to maintain a seal, and 2 piece pouching system. 4 sets of extra supplies left in the room for staff nurses use. Use supplies: barrier ring, [place] # [phone] and wafer Kellie Simmering # 2 and pouch [employer] Education provided: Pt will need total assistance with pouching activities after discharge. According to progress notes; "Pt's mentation fluctuates per chart review and he is unable to make his own decisions with poor insight per palliative care team. No alternative decision maker has been identified. He has been a resident of long-term care about 5 years."   Enrolled patient in [place] program: No; Pt plans to return to SNF after discharge. Thank-you,  Julien Girt MSN, [place], [place], [place], [place]

## 2022-05-17 NOTE — Consult Note (Signed)
[place] for Electrolyte Monitoring and Replacement   Recent Labs: Potassium (mmol/L)  Date Value  05/17/2022 3.7  10/03/2012 3.1 (L)   Magnesium (mg/dL)  Date Value  05/16/2022 2.1  10/02/2012 1.3 (L)   Calcium (mg/dL)  Date Value  05/17/2022 8.0 (L)   Calcium, Total (mg/dL)  Date Value  10/03/2012 8.2 (L)   Albumin (g/dL)  Date Value  05/15/2022 2.1 (L)  10/02/2012 3.4   Phosphorus (mg/dL)  Date Value  05/15/2022 3.3   Sodium (mmol/L)  Date Value  05/17/2022 138  10/03/2012 141   Assessment: 81 y.o. male with medical history significant of HTN, HLD, GERD, COPD, CHF (diastolic), seizure disorder, BPH who comes in today with presumed COPD exacerbation. Found to have a PE started apixaban. Pharmacy consulted to manage electrolytes.  Noted electrolytes relatively stable and Kcl po BID ordered by MD on 19/8.  Goal of Therapy:  Within normal limits  Plan: Patient on Kcl po twice daily per MD. No additional electrolytes replacement needed today. Will dc electrolytes consult and follow peripherally.   Aprille Sawhney Rodriguez-Guzman PharmD, BCPS 05/17/2022 8:21 AM

## 2022-05-17 NOTE — TOC Transition Note (Signed)
Transition of Care [facility]) - CM/SW Discharge Note   Patient Details  Name: SAMARION EHLE MRN: [id number] Date of Birth: 1941/01/01  Transition of Care [facility]) CM/SW Contact:  Alberteen Sam, LCSW Phone Number: 05/17/2022, 12:03 PM   Clinical Narrative:     Patient will DC AT:[facility] Anticipated DC date: 05/17/22 Transport by: Johnanna Schneiders  Per MD patient ready for DC to [facility] . RN, patient, patient's family, and facility notified of DC. Discharge Summary sent to facility. RN given number for report  [phone] Room Room 308B. DC packet on chart. Ambulance transport requested for patient.  CSW signing off.  Pricilla Riffle, LCSW    Final next level of care: Skilled Nursing Facility Barriers to Discharge: No Barriers Identified   Patient Goals and CMS Choice Patient states their goals for this hospitalization and ongoing recovery are:: to go home CMS Medicare.gov Compare Post Acute Care list provided to::  (N/A) Choice offered to / list presented to : NA  Discharge Placement              Patient chooses bed at: [facility] [place] Patient to be transferred to facility by: [facility]   Patient and family notified of of transfer: 05/17/22  Discharge Plan and Services     Post Acute Care Choice: Resumption of Svcs/PTA Provider                               Social Determinants of Health (SDOH) Interventions     Readmission Risk Interventions    05/04/2022   12:15 PM  Readmission Risk Prevention Plan  Transportation Screening Complete  Medication Review ([place]) Complete  PCP or Specialist appointment within 3-5 days of discharge Complete  SW Recovery Care/Counseling Consult Complete  [place] Complete

## 2022-05-17 NOTE — TOC Progression Note (Addendum)
Transition of Care [facility]) - Progression Note    Patient Details  Name: [name] MRN: [id number] Date of Birth: Feb 20, 1941  Transition of Care [facility]) CM/SW [place], [place] Phone Number: 05/17/2022, 10:01 AM  Clinical Narrative:     Per Gena with [facility] patient can return to LTC there whenever medically cleared. Pending medical readiness to dc back to [facility], MD made aware.  Room 308B  Expected Discharge Plan: [place] Barriers to Discharge: Continued Medical Work up  Expected Discharge Plan and Services Expected Discharge Plan: [place] Acute Care Choice: Resumption of Svcs/PTA Provider Living arrangements for the past 2 months: [place]                                       Social Determinants of Health (SDOH) Interventions    Readmission Risk Interventions    05/04/2022   12:15 PM  Readmission Risk Prevention Plan  Transportation Screening Complete  Medication Review (RN Care Manager) Complete  PCP or Specialist appointment within 3-5 days of discharge Complete  SW Recovery Care/Counseling Consult Complete  [place] Complete

## 2022-05-17 NOTE — Discharge Summary (Signed)
Physician Discharge Summary   Patient: [name] MRN: [id number] DOB: [birth year]  Admit date:     05/02/2022  Discharge date: 05/17/22  Discharge Physician: Ezekiel Slocumb   PCP: Rica Koyanagi, MD   Recommendations at discharge:   Follow up with surgery in 2 weeks Follow up with vascular surgery (as scheduled, or call to confirm appt) Follow up with primary care in 1 week Repeat BMP, Mg, CBC in 1 week Follow up on blood pressure  Monitor colostomy site and output closely. Contact surgery if concerns or questions  Discharge Diagnoses: Principal Problem:   Pulmonary embolism (HCC) Active Problems:   UTI (urinary tract infection)   Chronic diastolic CHF (congestive heart failure) (HCC)   Seizure disorder (HCC)   Depression   Hypertension   Mixed hyperlipidemia   BPH (benign prostatic hyperplasia)   Normocytic anemia   Hypokalemia   DVT of lower extremity, bilateral (HCC)   Venous insufficiency of both lower extremities   Pressure injury of skin   COPD exacerbation (HCC)   Malnutrition of moderate degree   Acute deep vein thrombosis (DVT) of left lower extremity (HCC)   Acute respiratory failure with hypoxia (HCC)   Sigmoid volvulus (HCC)   Metabolic acidosis, normal anion gap (NAG)  Resolved Problems:   * No resolved hospital problems. [facility] Course: This 81 y.o. male with medical history significant of HTN, HLD, GERD, COPD, CHF (diastolic), seizure disorder, BPH who comes in ED with presumed COPD exacerbation.  Per the EDP he was wheezing on admission and hypoxic.  EMS had to put him on CPAP to get his oxygen saturations up.  His blood gases were essentially normal.  He was weaned down to nasal cannula. CT chest with contrast Filling defects within right lower lobe pulmonary arterial branches compatible with pulmonary emboli.  Patient was admitted for acute pulmonary embolism, started on IV heparin.  Patient has significant risk of aspiration.  Speech and  swallow recommended n.p.o. with alternate means of feeding.  Palliative care consulted to discuss goals of care.     Psychiatry consulted due to concern for patient's capacity to make decision and no alternate decision-maker has been identified at this time.  Ethics committee consulted to make a determination since patient has no next of kin.  Psychiatry consulted patient does have a capacity to make decisions.  Patient is agreeable for surgical intervention.     Patient has developed sigmoid volvulus overnight.  General surgery and GI consulted.  Patient underwent emergent colonoscopy with decompression of the volvulus.   He underwent IVC filter for pulmonary embolism , since patient needs to be off anticoagulation for 48 hours.     Patient was taken to OR on 10/4 and underwent Hartmann's procedure. Patient has done well in post-operative period, has appropriate post-op abdominal pain controlled with medications.  He had significant electrolyte abnormalities and metabolic acidosis related to high GI output post-opertively.  These have improved and stabilized.    Patient is medically stable to return to his facility today.    Assessment and Plan: Acute pulmonary embolism / Acute Right LE DVT:  Patient found to have acute pulmonary embolism and acute Right LE DVT He was started on heparin gtt. This was primary cause for his respiratory distress and oxygen requirements. Now stable on room air. Patient underwent IVC filter placement 10/3 in order to be off anticoagulation for surgery. Resumed on Eliquis 10/5. Follow up with Vascular surgery as outpatient   Sigmoid volvulus:  Patient has developed sigmoid volvulus 10/1.   General surgery and GI consulted.   Patient underwent emergent colonoscopy with decompression of the volvulus with rectal tube.   Definitive treatment would be surgical correction.   Patient is at high risk for volvulus recurrence.  Decompression provides time but not a  definite treatment.   Ethics committee consulted to make a determination since patient has no next of kin.  Psych consulted.  Patient does have capacity to make decisions. Pt underwent Hartmann's procedure on 10/4. WOC consulted for ostomy care. Diet advancement per surgery. Pain control PRN. Follow up with surgery in 2 weeks as outpatient     Hypernatremia: Resolbved Na 147 due to poor PO intake, not drinking enough water (is on honey-thickened liquids). Sodium normalized 142 >> 140 after PO intake improved and given IV lasix Repeat BMP in AM   Hyperkalemia: due to aggressive replacement. Now resolved.   Hypokalemia: Replaced.  Continue to monitor On scheduled potassium with Lasix Repeat BMP in 1 week   Normal-anion gap metabolic acidosis -  Due to high ostomy output. -- Stop MiraLAX  --Use stool softeners AS NEEDED Repeat BMP in 1 week    Chronic diastolic CHF: Lisinopril, carvedilol and Lasix on hold because of low blood pressure. BNP is slightly elevated but chest x-ray does not show fluid overload. --20 mg IV Lasix x 1 given 10/6 --Resume home Lasix 20 mg PO BID --Daily weights --Monitor renal function, electrolytes   UTI in the setting of chronic suprapubic catheter: Resolved Patient has indwelling suprapubic catheter and urine had a lot of sediment in it. Suprapubic catheter was changed  (05/03/22) It was changed on August 25th 2023 at urology office   Aspiration pneumonia: Resolved Completed antibiotics for aspiration pneumonia. Chest x-ray shows improving infiltrate. NG tube inserted for medications and for feeding. Now resumed on diet and NG removed.   Dysphagia with high aspiration risk: Speech and swallow evaluation completed, was initially felt very high risk for aspiration & recommended NPO. Completed course of antibiotics for aspiration pneumonia Palliative care consulted to discuss goals of care Temporarily had NG tube for feeding and for  medications. Now on dysphagia-3 diet with honey thickened liquids. Remains high risk for recurrent aspiration and infection.   Seizure disorder (HCC) Continue phenobarbital.   Depression Continue Wellbutrin, Abilify.   Hypertension Hold lisinopril, carvedilol because of low blood pressure.   Mixed hyperlipidemia Continue Pravachol.   BPH (benign prostatic hyperplasia) Continue Proscar.   Normocytic anemia Hemoglobin is stable.  Monitor CBC   COPD: Currently no wheezing. Continue inhalers.   Venous insufficiency of both lower extremities Continue Lasix as needed   Obesity: Estimated body mass index is 34.63 kg/m as calculated from the following:   Height as of this encounter: 5\' 7"  (1.702 m).   Weight as of this encounter: 100.3 kg.  Complicates overall care and prognosis.  Recommend lifestyle modifications including physical activity and diet for weight loss and overall long-term health.    Pressure injury of skin - POA --I agree with wound descriptions as below. --Monitor closely --Diligent wound care --Reposition pt every 2 hrs --Offload pressure areas   Pressure Injury 05/03/22 Buttocks Right Stage 2 -  Partial thickness loss of dermis presenting as a shallow open injury with a red, pink wound bed without slough. 1X1cm (Active)  05/03/22 0858  Location: Buttocks  Location Orientation: Right  Staging: Stage 2 -  Partial thickness loss of dermis presenting as a shallow open injury with  a red, pink wound bed without slough.  Wound Description (Comments): 1X1cm  Present on Admission: Yes     Pressure Injury 05/03/22 Buttocks Left Stage 2 -  Partial thickness loss of dermis presenting as a shallow open injury with a red, pink wound bed without slough. 1x1cm (Active)  05/03/22 0859  Location: Buttocks  Location Orientation: Left  Staging: Stage 2 -  Partial thickness loss of dermis presenting as a shallow open injury with a red, pink wound bed without slough.   Wound Description (Comments): 1x1cm  Present on Admission: Yes               Consultants: General surgery, vascular surgery, GI  Procedures performed: IVC filter placement, colonoscopic decompression, Hartmann's procedure with colostomy   Disposition: Long term care facility Diet recommendation:  Discharge Diet Orders (From admission, onward)     Start     Ordered   05/17/22 0000  Diet - low sodium heart healthy        05/17/22 1135              DISCHARGE MEDICATION: Allergies as of 05/17/2022       Reactions   Penicillin G Rash, Other (See Comments)   Convulsions.    Has patient had a PCN reaction causing immediate rash, facial/tongue/throat swelling, SOB or lightheadedness with hypotension: not sure Has patient had a PCN reaction causing severe rash involving mucus membranes or skin necrosis: not sure Has patient had a PCN reaction that required hospitalization: not sure Has patient had a PCN reaction occurring within the last 10 years: not sure If all of the above answers are "NO", then may proceed with Cephalosporin use.        Medication List     STOP taking these medications    lisinopril 10 MG tablet Commonly known as: ZESTRIL   tamsulosin 0.4 MG Caps capsule Commonly known as: FLOMAX       TAKE these medications    albuterol 108 (90 Base) MCG/ACT inhaler Commonly known as: VENTOLIN HFA Inhale 2 puffs into the lungs every 6 (six) hours as needed for wheezing or shortness of breath.   ammonium lactate 12 % cream Commonly known as: AMLACTIN Apply 1 Application topically every 12 (twelve) hours.   apixaban 5 MG Tabs tablet Commonly known as: ELIQUIS Take 1 tablet (5 mg total) by mouth 2 (two) times daily.   ARIPiprazole 20 MG tablet Commonly known as: ABILIFY Take 20 mg by mouth in the morning.   ascorbic acid 500 MG tablet Commonly known as: VITAMIN C Take 1 tablet (500 mg total) by mouth 2 (two) times daily.   buPROPion 300 MG 24  hr tablet Commonly known as: Budeprion XL Take 1 tablet (300 mg total) by mouth daily.   carvedilol 25 MG tablet Commonly known as: COREG Take 25 mg by mouth 2 (two) times daily.   cetirizine 10 MG tablet Commonly known as: ZYRTEC Take 10 mg by mouth in the morning.   cholecalciferol 25 MCG (1000 UNIT) tablet Commonly known as: VITAMIN D3 Take 1,000 Units by mouth in the morning.   Cranberry 250 MG Caps Take 250 mg by mouth 2 (two) times daily.   Dextromethorphan HBr 15 MG Caps Take 2 capsules by mouth every 12 (twelve) hours.   ferrous sulfate 325 (65 FE) MG tablet Take 325 mg by mouth daily with breakfast.   finasteride 5 MG tablet Commonly known as: PROSCAR Take 1 tablet (5 mg total) by mouth  daily.   fluticasone furoate-vilanterol 100-25 MCG/ACT Aepb Commonly known as: BREO ELLIPTA Inhale 1 puff into the lungs daily. Start taking on: October 10, [id number]   folic acid 1 MG tablet Commonly known as: FOLVITE Take 1 mg by mouth in the morning.   furosemide 20 MG tablet Commonly known as: LASIX Take 1 tablet (20 mg total) by mouth 2 (two) times daily. What changed: when to take this   guaiFENesin 100 MG/5ML liquid Commonly known as: ROBITUSSIN Take 5 mLs by mouth every 4 (four) hours as needed for cough or to loosen phlegm.   HYDROcodone-acetaminophen 5-325 MG tablet Commonly known as: NORCO/VICODIN Take 1-2 tablets by mouth every 4 (four) hours as needed for moderate pain.   lactulose 10 GM/15ML solution Commonly known as: CHRONULAC Take 15 mLs (10 g total) by mouth every 6 (six) hours as needed for mild constipation or moderate constipation.   Multi-Vitamins Tabs Take 1 tablet by mouth daily.   PHENobarbital 64.8 MG tablet Commonly known as: LUMINAL Take 64.8 mg by mouth every other day.   polyethylene glycol 17 g packet Commonly known as: MIRALAX / GLYCOLAX Take 17 g by mouth daily as needed.   potassium chloride SA 20 MEQ tablet Commonly known as:  KLOR-CON M Take 1 tablet (20 mEq total) by mouth 2 (two) times daily. What changed: when to take this   pravastatin 10 MG tablet Commonly known as: PRAVACHOL Take 10 mg by mouth at bedtime.   Suvorexant 5 MG Tabs Take 5 mg by mouth at bedtime.   umeclidinium bromide 62.5 MCG/ACT Aepb Commonly known as: INCRUSE ELLIPTA Inhale 1 puff into the lungs daily. Start taking on: May 18, 2022   zinc oxide 20 % ointment Apply 1 application. topically 2 (two) times daily. (Apply to legs)               Discharge Care Instructions  (From admission, onward)           Start     Ordered   05/17/22 0000  Discharge wound care:       Comments: As above   05/17/22 1135            Follow-up Information     Rica Koyanagi, MD. Schedule an appointment as soon as possible for a visit in 1 week(s).   Specialty: Geriatric Medicine Why: hospital follow up Contact information: [place] [address] [phone]         Herbert Pun, MD Follow up.   Specialty: General Surgery Why: Follow up in 2 weeks Contact information: [place] [address] [phone]         Schnier, Dolores Lory, MD Follow up.   Specialties: Vascular Surgery, Cardiology, Radiology, Vascular Surgery Why: follow up regarding possible IVC filter removal Contact information: [place] [address] [phone]                Discharge Exam: Filed Weights   05/14/22 0630 05/15/22 0410 05/17/22 0500  Weight: 102.1 kg 100.3 kg 98.8 kg   General exam: awake, alert, no acute distress, obese, chronically ill appearing HEENT: edentulous, moist mucus membranes, hearing grossly normal  Respiratory system: CTAB diminished bases due to poor inspiratory effort, no wheezes, on room air, normal respiratory effort. Cardiovascular system: normal S1/S2, RRR, improved dependent edema.   Gastrointestinal system: soft, NT, ND, midline incision  with honeycomb dressing in place no surround erythema or swelling, colostomy bag with air and  liquid stool  Central nervous system: A&O x self hospital. no gross focal neurologic deficits, normal speech Extremities: offloading boots on both feet, no edema, normal tone Skin: dry, intact, normal temperature Psychiatry: normal mood, congruent affect   Condition at discharge: stable  The results of significant diagnostics from this hospitalization (including imaging, microbiology, ancillary and laboratory) are listed below for reference.   Imaging Studies: PERIPHERAL VASCULAR CATHETERIZATION  Result Date: 05/11/2022 See surgical note for result.  DG Abd 1 View  Result Date: 05/10/2022 CLINICAL DATA:  Sigmoid volvulus of the colon. EXAM: ABDOMEN - 1 VIEW COMPARISON:  CT scan May 08, 2022.  KUBs May 09, 2022 FINDINGS: A rectal tube terminates in the right upper quadrant, stable in position. There is increasing colonic dilatation measuring up to 12 cm in caliber. The dilatation has increased significantly since the most recent KUB. There is a sharp turn in the rectal tube as it exits the patient. No small bowel dilatation. Bilateral renal stones identified. An NG tube terminates in the stomach. No other abnormalities. IMPRESSION: 1. A rectal tube terminates in the right upper quadrant. There is a sharp turn/possible kink in the rectal tube is at exits the patient. There is significant increased colonic dilatation since the most recent KUB measuring up to 12 cm in caliber. 2. No other interval changes. These results will be called to the ordering clinician or representative by the Radiologist Assistant, and communication documented in the PACS or [employer]. Electronically Signed   By: Dorise Bullion III M.D.   On: 05/10/2022 08:10   DG Abd 1 View  Result Date: 05/09/2022 CLINICAL DATA:  Follow-up sigmoid volvulus. EXAM: ABDOMEN - 1 VIEW COMPARISON:  CT, 05/08/2022.  Radiographs,  05/09/2022 at 3:12 a.m. FINDINGS: Gas-filled loops of small bowel and colon. There is colonic dilation that is decreased from the prior radiographs and more significantly decreased when compared to the CT from 05/08/2022. Rectal/colonic tube tip projects in the right mid to upper abdomen. Stable suprapubic catheter. IMPRESSION: 1. Interval improvement with a decrease in colonic dilation compared to the exam earlier today, and more significantly improved compared to the previous day's CT. Electronically Signed   By: Lajean Manes M.D.   On: 05/09/2022 09:34   DG Abd 1 View  Result Date: 05/09/2022 CLINICAL DATA:  Status post decompression of sigmoid volvulus EXAM: ABDOMEN - 1 VIEW COMPARISON:  CT abdomen and pelvis 05/08/2022 FINDINGS: Enteric tube tip in the stomach with side port not well visualized but likely near the gastroesophageal junction. Redemonstrated marked gaseous distention of the colon, similar to slightly decreased from previous CT. Foley catheter in the bladder. IMPRESSION: Similar to slight decrease in gaseous distention of the colon compared to prior CT. Electronically Signed   By: Placido Sou M.D.   On: 05/09/2022 03:52   CT ABDOMEN PELVIS WO CONTRAST  Result Date: 05/08/2022 CLINICAL DATA:  Suspected bowel obstruction. EXAM: CT ABDOMEN AND PELVIS WITHOUT CONTRAST TECHNIQUE: Multidetector CT imaging of the abdomen and pelvis was performed following the standard protocol without IV contrast. RADIATION DOSE REDUCTION: This exam was performed according to the departmental dose-optimization program which includes automated exposure control, adjustment of the mA and/or kV according to patient size and/or use of iterative reconstruction technique. COMPARISON:  March 25, 2022 FINDINGS: Lower chest: Marked severity consolidation is seen within the posterior aspect of the right lung base. Mild posterolateral left basilar atelectasis is noted. Hepatobiliary: No focal liver abnormality is seen.  The gallbladder is contracted without  evidence of gallstones, gallbladder wall thickening, or biliary dilatation. Pancreas: Unremarkable. No pancreatic ductal dilatation or surrounding inflammatory changes. Spleen: Normal in size without focal abnormality. Adrenals/Urinary Tract: Adrenal glands are unremarkable. Kidneys are normal in size with mild bilateral renal cortical thinning noted. Multiple cysts are seen within the right kidney. The largest measures approximately 6.9 cm x 4.9 cm. Numerous bilateral renal and proximal ureteral calculi of various sizes are seen. The largest on the right measures approximately 1.3 cm, while the largest on the left measures 1.0 cm. A suprapubic catheter is seen within an empty urinary bladder. Stomach/Bowel: A nasogastric tube is seen with its distal tip noted within the body of the stomach. Numerous markedly dilated large bowel loops are seen throughout the abdomen and pelvis. A transition zone is seen within the mid sigmoid colon. Twisting of the mesentery is noted within this region (axial CT images 78 through 87, CT series 2). The small bowel loops are predominantly decompressed. The appendix is normal in appearance. Vascular/Lymphatic: Aortic atherosclerosis. No enlarged abdominal or pelvic lymph nodes. Reproductive: The prostate gland is not identified. Other: There is moderate severity anasarca involving predominantly the posterolateral aspects of the abdominal and pelvic walls. No abdominopelvic ascites. Musculoskeletal: Marked severity multilevel degenerative changes seen throughout the lumbar spine. IMPRESSION: 1. 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 2. Marked severity right lower lobe consolidation, consistent with pneumonia. 3. Numerous bilateral renal and proximal ureteral calculi of various sizes. 4. Multiple right renal cysts (Bosniak 2). No additional follow-up or imaging is  recommended. This recommendation follows ACR consensus guidelines: Management of the Incidental Renal Mass on CT: A White Paper of the ACR Incidental Findings Committee. J Am Coll Radiol [phone]. 5. Moderate severity anasarca. 6. Aortic atherosclerosis. Electronically Signed   By: Virgina Norfolk M.D.   On: 05/08/2022 23:25   DG Abd 1 View  Result Date: 05/08/2022 CLINICAL DATA:  [id number] NG tube placement EXAM: ABDOMEN - 1 VIEW COMPARISON:  May 08, 2022 FINDINGS: Incomplete visualization of the abdomen laterally and the pelvis. Carina is not visualized, limiting evaluation. Enteric tube tip projects over the expected region of the proximal stomach. Side port projects over the region of the distal esophagus. Marked gaseous dilation of loops of bowel, similar in comparison to prior. IMPRESSION: 1. Enteric tube side port projects over the distal esophagus. 2. Similar appearance of diffuse gaseous dilation of loops of bowel, possibly ileus. If concern for obstruction, recommend CT. Electronically Signed   By: Valentino Saxon M.D.   On: 05/08/2022 12:08   DG Abd 1 View  Result Date: 05/08/2022 CLINICAL DATA:  Check gastric catheter placement EXAM: ABDOMEN - 1 VIEW COMPARISON:  Film from the previous day. FINDINGS: Gastric catheter is again noted in the stomach. Scattered large and small bowel gas is again seen and stable. IMPRESSION: Gastric catheter in the stomach. Electronically Signed   By: Inez Catalina M.D.   On: 05/08/2022 00:32   DG Abd 1 View  Result Date: 05/07/2022 CLINICAL DATA:  Check gastric catheter placement EXAM: ABDOMEN - 1 VIEW COMPARISON:  None Available. FINDINGS: Gastric catheter is noted within the stomach. Scattered large and small bowel gas is again identified. This likely represents an ileus. Correlate with clinical exam. Degenerative changes of lumbar spine are noted. IMPRESSION: Gastric catheter in place. Considerable gaseous distension of the bowel which may  represent an ileus. Electronically Signed   By: Linus Mako.D.  On: 05/07/2022 19:32   DG Chest Port 1 View  Result Date: 05/06/2022 CLINICAL DATA:  Pneumonia EXAM: PORTABLE CHEST 1 VIEW COMPARISON:  05/04/2022 FINDINGS: Normal cardiac silhouette. Low lung volumes. Fine airspace disease in the RIGHT upper lobe is slightly improved from comparison exam. Elevated LEFT hemidiaphragm. No acute osseous abnormality. IMPRESSION: 1. Slight improvement in RIGHT upper lobe pneumonia. 2. Low lung volumes. Electronically Signed   By: Suzy Bouchard M.D.   On: 05/06/2022 09:29   DG Chest Port 1 View  Result Date: 05/04/2022 CLINICAL DATA:  Shortness of breath EXAM: PORTABLE CHEST 1 VIEW COMPARISON:  05/02/2022 FINDINGS: Patchy airspace disease in the right upper lobe. Heart is normal size. Aortic calcifications. No confluent opacity on the left. No effusions or acute bony abnormality. IMPRESSION: Patchy airspace opacity in the right upper lobe concerning for pneumonia. Electronically Signed   By: Rolm Baptise M.D.   On: 05/04/2022 21:01   US Venous Img Lower Bilateral (DVT)  Result Date: 05/04/2022 CLINICAL DATA:  80 year old male with pulmonary embolism in lower extremity swelling. EXAM: BILATERAL LOWER EXTREMITY VENOUS DOPPLER ULTRASOUND TECHNIQUE: Gray-scale sonography with graded compression, as well as color Doppler and duplex ultrasound were performed to evaluate the lower extremity deep venous systems from the level of the common femoral vein and including the common femoral, femoral, profunda femoral, popliteal and calf veins including the posterior tibial, peroneal and gastrocnemius veins when visible. The superficial great saphenous vein was also interrogated. Spectral Doppler was utilized to evaluate flow at rest and with distal augmentation maneuvers in the common femoral, femoral and popliteal veins. COMPARISON:  01/10/2022 FINDINGS: RIGHT LOWER EXTREMITY Common Femoral Vein: Nonocclusive,  heterogeneously isoechoic thrombus is present peripherally. The common femoral vein appears patent with normal respiratory variation centrally. Saphenofemoral Junction: Nonocclusive, heterogeneously isoechoic thrombus is present. Profunda Femoral Vein: No evidence of thrombus. Normal compressibility and flow on color Doppler imaging. Femoral Vein: Nonocclusive, heterogeneously isoechoic thrombus is present throughout. Popliteal Vein: No evidence of thrombus. Normal compressibility, respiratory phasicity and response to augmentation. Calf Veins: No evidence of thrombus. Normal compressibility and flow on color Doppler imaging. Other Findings:  None. LEFT LOWER EXTREMITY Common Femoral Vein: No evidence of thrombus. Normal compressibility, respiratory phasicity and response to augmentation. Saphenofemoral Junction: No evidence of thrombus. Normal compressibility and flow on color Doppler imaging. Profunda Femoral Vein: No evidence of thrombus. Normal compressibility and flow on color Doppler imaging. Femoral Vein: No evidence of thrombus. Normal compressibility, respiratory phasicity and response to augmentation. Popliteal Vein: No evidence of thrombus. Normal compressibility, respiratory phasicity and response to augmentation. Calf Veins: No evidence of thrombus. Normal compressibility and flow on color Doppler imaging. Other Findings:  None. IMPRESSION: 1. Subacute appearing right lower extremity deep vein thrombosis extending from the peripheral common femoral vein through the peripheral femoral vein. No definite evidence of iliac extension. 2. No evidence of left lower extremity deep vein thrombosis. Ruthann Cancer, MD [facility] Radiology Electronically Signed   By: Ruthann Cancer M.D.   On: 05/04/2022 08:08   CT HEAD WO CONTRAST (5MM)  Result Date: 05/04/2022 CLINICAL DATA:  Altered mental status, unequal pupils EXAM: CT HEAD WITHOUT CONTRAST TECHNIQUE:  Contiguous axial images were obtained from the base of the skull through the vertex without intravenous contrast. RADIATION DOSE REDUCTION: This exam was performed according to the departmental dose-optimization program which includes automated exposure control, adjustment of the mA and/or kV according to patient size and/or use of iterative reconstruction technique. COMPARISON:  01/10/2022 FINDINGS: Brain: No evidence of acute infarction, hemorrhage, mass, mass effect, or midline shift. No hydrocephalus or extra-axial fluid collection. Periventricular white matter changes, likely the sequela of chronic small vessel ischemic disease. Vascular: No hyperdense vessel. Atherosclerotic calcifications in the intracranial carotid and vertebral arteries. Skull: Normal. Negative for fracture or focal lesion. Sinuses/Orbits: No acute finding. Other: Fluid in the right mastoid air cells. IMPRESSION: No acute intracranial process. Electronically Signed   By: Merilyn Baba M.D.   On: 05/04/2022 02:25   CT Angio Chest PE W and/or Wo Contrast  Result Date: 05/02/2022 CLINICAL DATA:  Pulmonary embolism (PE) suspected, high prob. Shortness of breath EXAM: CT ANGIOGRAPHY CHEST WITH CONTRAST TECHNIQUE: Multidetector CT imaging of the chest was performed using the standard protocol during bolus administration of intravenous contrast. Multiplanar CT image reconstructions and MIPs were obtained to evaluate the vascular anatomy. RADIATION DOSE REDUCTION: This exam was performed according to the departmental dose-optimization program which includes automated exposure control, adjustment of the mA and/or kV according to patient size and/or use of iterative reconstruction technique. CONTRAST:  19mL OMNIPAQUE IOHEXOL 350 MG/ML SOLN COMPARISON:  10/02/2012 FINDINGS: Cardiovascular: Filling defects within right lower lobe pulmonary arterial branches compatible with pulmonary emboli. No pulmonary emboli on the left. No evidence of right  heart strain. Heart is normal size. Aorta normal caliber. Coronary artery and aortic calcifications. Mediastinum/Nodes: No mediastinal, hilar, or axillary adenopathy. Trachea and esophagus are unremarkable. Thyroid unremarkable. Lungs/Pleura: Elevation of the right hemidiaphragm. Right basilar opacity could reflect atelectasis or early infarct. Left pendant atelectasis. No effusions. Upper Abdomen: Gaseous distention of bowel in the upper abdomen. Musculoskeletal: Chest wall soft tissues are unremarkable. No acute bony abnormality. Review of the MIP images confirms the above findings. IMPRESSION: Filling defects within right lower lobe pulmonary arterial branches compatible with pulmonary emboli. Associated right basilar airspace opacity could reflect atelectasis or infarcts. Elevation of the right hemidiaphragm. Coronary artery disease. Aortic Atherosclerosis (ICD10-I70.0). These results were called by telephone at the time of interpretation on 05/02/2022 at 11:47 pm to provider Merlyn Lot , who verbally acknowledged these results. Electronically Signed   By: Rolm Baptise M.D.   On: 05/02/2022 23:48   DG Chest Port 1 View  Result Date: 05/02/2022 CLINICAL DATA:  Questionable sepsis - evaluate for abnormality. Shortness of breath EXAM: PORTABLE CHEST 1 VIEW COMPARISON:  01/10/2022 FINDINGS: Heart and mediastinal contours are within normal limits. No focal opacities or effusions. No acute bony abnormality. IMPRESSION: No active disease. Electronically Signed   By: Rolm Baptise M.D.   On: 05/02/2022 22:06    Microbiology: Results for orders placed or performed during the hospital encounter of 05/02/22  SARS Coronavirus 2 by RT PCR (hospital order, performed in [facility] hospital lab) *cepheid single result test* Anterior Nasal Swab     Status: None   Collection Time: 05/02/22  9:21 PM   Specimen: Anterior Nasal Swab  Result Value Ref Range Status   SARS Coronavirus 2 by RT PCR NEGATIVE NEGATIVE  Final    Comment: (NOTE) SARS-CoV-2 target nucleic acids are NOT DETECTED.  The SARS-CoV-2 RNA is generally detectable in upper and lower respiratory specimens during the acute phase of infection. The lowest concentration of SARS-CoV-2 viral copies this assay can detect is 250 copies / mL. A negative result does not preclude SARS-CoV-2 infection and should not be used as the sole basis for treatment or other patient management decisions.  A negative result may occur with improper specimen collection / handling,  submission of specimen other than nasopharyngeal swab, presence of viral mutation(s) within the areas targeted by this assay, and inadequate number of viral copies (<250 copies / mL). A negative result must be combined with clinical observations, patient history, and epidemiological information.  Fact Sheet for Patients:   https://www.patel.info/  Fact Sheet for Healthcare Providers: https://hall.com/  This test is not yet approved or  cleared by the [place] FDA and has been authorized for detection and/or diagnosis of SARS-CoV-2 by FDA under an Emergency Use Authorization (EUA).  This EUA will remain in effect (meaning this test can be used) for the duration of the COVID-19 declaration under Section 564(b)(1) of the Act, 21 U.S.C. section 360bbb-3(b)(1), unless the authorization is terminated or revoked sooner.  Performed at [facility], [place], [place] [address]   Respiratory (~20 pathogens) panel by PCR     Status: None   Collection Time: 05/03/22  1:18 AM   Specimen: Nasopharyngeal Swab; Respiratory  Result Value Ref Range Status   Adenovirus NOT DETECTED NOT DETECTED Final   Coronavirus 229E NOT DETECTED NOT DETECTED Final    Comment: (NOTE) The Coronavirus on the Respiratory Panel, DOES NOT test for the novel  Coronavirus (2019 nCoV)    Coronavirus HKU1 NOT DETECTED NOT DETECTED Final    Coronavirus NL63 NOT DETECTED NOT DETECTED Final   Coronavirus OC43 NOT DETECTED NOT DETECTED Final   Metapneumovirus NOT DETECTED NOT DETECTED Final   Rhinovirus / Enterovirus NOT DETECTED NOT DETECTED Final   Influenza A NOT DETECTED NOT DETECTED Final   Influenza B NOT DETECTED NOT DETECTED Final   Parainfluenza Virus 1 NOT DETECTED NOT DETECTED Final   Parainfluenza Virus 2 NOT DETECTED NOT DETECTED Final   Parainfluenza Virus 3 NOT DETECTED NOT DETECTED Final   Parainfluenza Virus 4 NOT DETECTED NOT DETECTED Final   Respiratory Syncytial Virus NOT DETECTED NOT DETECTED Final   Bordetella pertussis NOT DETECTED NOT DETECTED Final   Bordetella Parapertussis NOT DETECTED NOT DETECTED Final   Chlamydophila pneumoniae NOT DETECTED NOT DETECTED Final   Mycoplasma pneumoniae NOT DETECTED NOT DETECTED Final    Comment: Performed at [facility], [place]. [address], [place], [place] [address]  MRSA Next Gen by PCR, Nasal     Status: None   Collection Time: 05/03/22 11:24 AM   Specimen: Nasal Mucosa; Nasal Swab  Result Value Ref Range Status   MRSA by PCR Next Gen NOT DETECTED NOT DETECTED Final    Comment: (NOTE) The GeneXpert MRSA Assay (FDA approved for NASAL specimens only), is one component of a comprehensive MRSA colonization surveillance program. It is not intended to diagnose MRSA infection nor to guide or monitor treatment for MRSA infections. Test performance is not FDA approved in patients less than 54 years old. Performed at [facility], [address] [place], [place] [address]     Labs: CBC: Recent Labs  Lab 05/12/22 0441 05/13/22 0405 05/14/22 0429 05/15/22 0649 05/17/22 0517  WBC 6.9 8.2 7.6 7.2 6.8  HGB 10.4* 10.7* 9.1* 9.6* 9.2*  HCT 32.6* 32.7* 28.6* 29.5* 28.7*  MCV 87.6 86.7 88.3 87.3 86.4  PLT 237 224 198 188 [id number]   Basic Metabolic Panel: Recent Labs  Lab 05/11/22 0456 05/11/22 1639 05/12/22 0441 05/13/22 0405 05/14/22 0429  05/14/22 1814 05/15/22 0649 05/16/22 0517 05/17/22 0517  NA 148*  --    < > 145 147* 142 140 141 138  K 2.5* 2.9*   < >  3.7 5.2* 4.5 3.8 3.6 3.7  CL 127*  --    < > 124* 129* 124* 120* 115* 114*  CO2 19*  --    < > 18* 16* 20* 19* 20* 20*  GLUCOSE 157*  --    < > 181* 114* 98 89 92 93  BUN 17  --    < > 9 11 11 11 12 17   CREATININE 0.89  --    < > 0.99 1.08 1.01 0.86 0.95 1.01  CALCIUM 8.0*  --    < > 7.9* 8.1* 8.1* 8.0* 8.0* 8.0*  MG 2.0 2.0  --  1.8 1.8  --  1.8 2.1  --   PHOS 3.1 3.0  --  2.5 2.6  --  3.3  --   --    < > = values in this interval not displayed.   Liver Function Tests: Recent Labs  Lab 05/15/22 0649  ALBUMIN 2.1*   CBG: Recent Labs  Lab 05/16/22 1201 05/16/22 1724 05/16/22 1921 05/16/22 2317 05/17/22 0727  GLUCAP 109* 122* 122* 94 80    Discharge time spent: greater than 30 minutes.  Signed: Ezekiel Slocumb, DO [facility] 05/17/2022

## 2022-06-03 ENCOUNTER — Encounter (INDEPENDENT_AMBULATORY_CARE_PROVIDER_SITE_OTHER): Payer: Self-pay | Admitting: Vascular Surgery

## 2022-06-03 ENCOUNTER — Ambulatory Visit (INDEPENDENT_AMBULATORY_CARE_PROVIDER_SITE_OTHER): Payer: Medicare Other | Admitting: Vascular Surgery

## 2022-06-03 VITALS — BP 94/59 | HR 80 | Resp 18

## 2022-06-03 DIAGNOSIS — I825Z3 Chronic embolism and thrombosis of unspecified deep veins of distal lower extremity, bilateral: Secondary | ICD-10-CM

## 2022-06-03 DIAGNOSIS — I10 Essential (primary) hypertension: Secondary | ICD-10-CM

## 2022-06-03 DIAGNOSIS — D649 Anemia, unspecified: Secondary | ICD-10-CM | POA: Diagnosis not present

## 2022-06-03 DIAGNOSIS — Z86711 Personal history of pulmonary embolism: Secondary | ICD-10-CM

## 2022-06-03 NOTE — Progress Notes (Signed)
MRN : [id number]  [name] is a 81 y.o. (January 20, 1941) male who presents with chief complaint of legs hurt and swell.  History of Present Illness:   The patient presents to the office for evaluation of DVT.  DVT was identified at [facility] by Duplex ultrasound.  The initial symptoms were pain and swelling in the lower extremity.  During his admission he was found to have a PE and massive GI bleed.  Inferior vena cava filter is indicated for this reason.   Option Elite filter was placed 06/07/2022.  The patient notes the leg continues to be very painful with dependency and swells quite a bite.  Symptoms are much better with elevation.  The patient notes minimal edema in the morning which steadily worsens throughout the day.    The patient has not been using compression therapy at this point.  No SOB or pleuritic chest pains.  No cough or hemoptysis.  No blood per rectum or blood in any sputum.  No excessive bruising per the patient.   The patient denies amaurosis fugax or recent TIA symptoms. There are no recent neurological changes noted. No recent episodes of angina or shortness of breath documented.    Current Meds  Medication Sig   albuterol (VENTOLIN HFA) 108 (90 Base) MCG/ACT inhaler Inhale 2 puffs into the lungs every 6 (six) hours as needed for wheezing or shortness of breath.   ammonium lactate (AMLACTIN) 12 % cream Apply 1 Application topically every 12 (twelve) hours.   apixaban (ELIQUIS) 5 MG TABS tablet Take 1 tablet (5 mg total) by mouth 2 (two) times daily.   ARIPiprazole (ABILIFY) 20 MG tablet Take 20 mg by mouth in the morning.   ascorbic acid (VITAMIN C) 500 MG tablet Take 1 tablet (500 mg total) by mouth 2 (two) times daily.   buPROPion (BUDEPRION XL) 300 MG 24 hr tablet Take 1 tablet (300 mg total) by mouth daily.   carvedilol (COREG) 25 MG tablet Take 25 mg by mouth 2 (two) times daily.   cetirizine (ZYRTEC) 10 MG tablet Take 10 mg by mouth in the  morning.   cholecalciferol (VITAMIN D3) 25 MCG (1000 UNIT) tablet Take 1,000 Units by mouth in the morning.   Cranberry 250 MG CAPS Take 250 mg by mouth 2 (two) times daily.   Dextromethorphan HBr 15 MG CAPS Take 2 capsules by mouth every 12 (twelve) hours.   ferrous sulfate 325 (65 FE) MG tablet Take 325 mg by mouth daily with breakfast.   finasteride (PROSCAR) 5 MG tablet Take 1 tablet (5 mg total) by mouth daily.   fluticasone furoate-vilanterol (BREO ELLIPTA) 100-25 MCG/ACT AEPB Inhale 1 puff into the lungs daily.   folic acid (FOLVITE) 1 MG tablet Take 1 mg by mouth in the morning.   furosemide (LASIX) 20 MG tablet Take 1 tablet (20 mg total) by mouth 2 (two) times daily.   guaiFENesin (ROBITUSSIN) 100 MG/5ML liquid Take 5 mLs by mouth every 4 (four) hours as needed for cough or to loosen phlegm.   HYDROcodone-acetaminophen (NORCO/VICODIN) 5-325 MG tablet Take 1-2 tablets by mouth every 4 (four) hours as needed for moderate pain.   lactulose (CHRONULAC) 10 GM/15ML solution Take 15 mLs (10 g total) by mouth every 6 (six) hours as needed for mild constipation or moderate constipation.   Multiple Vitamin (MULTI-VITAMINS) TABS Take 1 tablet by mouth daily.   PHENobarbital (LUMINAL) 64.8 MG tablet Take 64.8  mg by mouth every other day.   polyethylene glycol (MIRALAX / GLYCOLAX) 17 g packet Take 17 g by mouth daily as needed.   potassium chloride SA (KLOR-CON M) 20 MEQ tablet Take 1 tablet (20 mEq total) by mouth 2 (two) times daily.   pravastatin (PRAVACHOL) 10 MG tablet Take 10 mg by mouth at bedtime.   Suvorexant 5 MG TABS Take 5 mg by mouth at bedtime.   umeclidinium bromide (INCRUSE ELLIPTA) 62.5 MCG/ACT AEPB Inhale 1 puff into the lungs daily.   zinc oxide 20 % ointment Apply 1 application. topically 2 (two) times daily. (Apply to legs)    Past Medical History:  Diagnosis Date   Bipolar 1 disorder (HCC)    C. difficile diarrhea 05/2016   history of ...   CHF (congestive heart failure)  (HCC)    Chronic kidney disease 05/21/2016   acute renal failure with sepsis and uti   COPD (chronic obstructive pulmonary disease) (HCC)    DVT (deep venous thrombosis) (HCC)    GERD (gastroesophageal reflux disease)    Hematuria    High cholesterol    History of kidney stones    Hypertension    Penile erosion 2018   d/t frequent foley catheters   Schizophrenia (HCC)    Seizure (HCC)    on phenobarb   Urinary retention    frequently requiring foley placement   VRE (vancomycin resistant enterococcus) culture positive    urine    Past Surgical History:  Procedure Laterality Date   COLECTOMY WITH COLOSTOMY CREATION/HARTMANN PROCEDURE N/A 05/12/2022   Procedure: PARTIAL COLECTOMY WITH COLOSTOMY CREATION/HARTMANN PROCEDURE;  Surgeon: Carolan Shiver, MD;  Location: [facility] ORS;  Service: General;  Laterality: N/A;   COLONOSCOPY WITH PROPOFOL N/A 05/09/2022   Procedure: COLONOSCOPY WITH PROPOFOL;  Surgeon: Jaynie Collins, DO;  Location: [facility] ENDOSCOPY;  Service: Gastroenterology;  Laterality: N/A;   EXTRACORPOREAL SHOCK WAVE LITHOTRIPSY Right 05/05/2017   Procedure: EXTRACORPOREAL SHOCK WAVE LITHOTRIPSY (ESWL);  Surgeon: Vanna Scotland, MD;  Location: [facility] ORS;  Service: Urology;  Laterality: Right;   IR CATHETER TUBE CHANGE  06/23/2017   IR CATHETER TUBE CHANGE  06/28/2017   IVC FILTER INSERTION N/A 05/11/2022   Procedure: IVC FILTER INSERTION;  Surgeon: Renford Dills, MD;  Location: [facility] INVASIVE CV LAB;  Service: Cardiovascular;  Laterality: N/A;   LAPAROTOMY N/A 05/12/2022   Procedure: EXPLORATORY LAPAROTOMY;  Surgeon: Carolan Shiver, MD;  Location: [facility] ORS;  Service: General;  Laterality: N/A;    Social History Social History   Tobacco Use   Smoking status: Never    Passive exposure: Never   Smokeless tobacco: Never  Vaping Use   Vaping Use: Never used  Substance Use Topics   Alcohol use: No   Drug use: No    Family History Family History   Problem Relation Age of Onset   Cancer Mother    Prostate cancer Neg Hx    Kidney disease Neg Hx    Kidney cancer Neg Hx    Bladder Cancer Neg Hx     Allergies  Allergen Reactions   Penicillin G Rash and Other (See Comments)    Convulsions.    Has patient had a PCN reaction causing immediate rash, facial/tongue/throat swelling, SOB or lightheadedness with hypotension: not sure Has patient had a PCN reaction causing severe rash involving mucus membranes or skin necrosis: not sure Has patient had a PCN reaction that required hospitalization: not sure Has patient had a PCN reaction occurring within the  last 10 years: not sure If all of the above answers are "NO", then may proceed with Cephalosporin use.     REVIEW OF SYSTEMS (Negative unless checked)  Constitutional: Weight loss  Fever  Chills Cardiac: Chest pain   Chest pressure   Palpitations   Shortness of breath when laying flat   Shortness of breath with exertion. Vascular:  Pain in legs with walking   Pain in legs at rest  History of DVT   Phlebitis   Swelling in legs   Varicose veins   Non-healing ulcers Pulmonary:   Uses home oxygen   Productive cough   Hemoptysis   Wheeze  COPD   Asthma Neurologic:  Dizziness   Seizures   History of stroke   History of TIA  Aphasia   Vissual changes   Weakness or numbness in arm   Weakness or numbness in leg Musculoskeletal:   Joint swelling   Joint pain   Low back pain Hematologic:  Easy bruising  Easy bleeding   Hypercoagulable state   Anemic Gastrointestinal:  Diarrhea   Vomiting  Gastroesophageal reflux/heartburn   Difficulty swallowing. Genitourinary:  Chronic kidney disease   Difficult urination  Frequent urination   Blood in urine Skin:  Rashes   Ulcers  Psychological:  History of anxiety    History of major depression.  Physical Examination  Vitals:   06/03/22 1536  BP: (!) 94/59   Pulse: 80  Resp: 18   There is no height or weight on file to calculate BMI. Gen: WD/WN, NAD; seen in a wheelchair Head: [place]/AT, No temporalis wasting.  Ear/Nose/Throat: Hearing grossly intact, nares w/o erythema or drainage, pinna without lesions Eyes: PER, EOMI, sclera nonicteric.  Neck: Supple, no gross masses.  No JVD.  Pulmonary:  Good air movement, no audible wheezing, no use of accessory muscles.  Cardiac: RRR, precordium not hyperdynamic. Vascular:   Moderate venous stasis changes to the legs bilaterally.  2+ soft pitting edema  Vessel Right Left  Radial Palpable Palpable  Gastrointestinal: soft, non-distended. No guarding/no peritoneal signs.  Musculoskeletal: M/S 5/5 throughout.  No deformity.  Neurologic: CN 2-12 intact. Pain and light touch intact in extremities.  Symmetrical.  Speech is fluent. Motor exam as listed above. Psychiatric: Judgment intact, Mood & affect appropriate for pt's clinical situation. Dermatologic: Venous rashes no ulcers noted.  No changes consistent with cellulitis. Lymph : No lichenification or skin changes of chronic lymphedema.  CBC Lab Results  Component Value Date   WBC 6.8 05/17/2022   HGB 9.2 (L) 05/17/2022   HCT 28.7 (L) 05/17/2022   MCV 86.4 05/17/2022   PLT 200 05/17/2022    BMET    Component Value Date/Time   NA 138 05/17/2022 0517   NA 141 10/03/2012 0524   K 3.7 05/17/2022 0517   K 3.1 (L) 10/03/2012 0524   CL 114 (H) 05/17/2022 0517   CL 106 10/03/2012 0524   CO2 20 (L) 05/17/2022 0517   CO2 28 10/03/2012 0524   GLUCOSE 93 05/17/2022 0517   GLUCOSE 107 (H) 10/03/2012 0524   BUN 17 05/17/2022 0517   BUN 14 10/03/2012 0524   CREATININE 1.01 05/17/2022 0517   CREATININE 1.02 10/03/2012 0524   CALCIUM 8.0 (L) 05/17/2022 0517   CALCIUM 8.2 (L) 10/03/2012 0524   GFRNONAA >60 05/17/2022 0517   GFRNONAA >60 10/03/2012 0524   GFRAA >60 12/25/2016 0559   GFRAA >60 10/03/2012 0524   CrCl cannot be calculated (Unknown ideal  weight.).  COAG Lab Results  Component Value Date   INR 1.3 (H) 05/09/2022   INR 1.2 05/02/2022   INR 1.3 (H) 01/10/2022    Radiology PERIPHERAL VASCULAR CATHETERIZATION  Result Date: 05/11/2022 See surgical note for result.  DG Abd 1 View  Result Date: 05/10/2022 CLINICAL DATA:  Sigmoid volvulus of the colon. EXAM: ABDOMEN - 1 VIEW COMPARISON:  CT scan May 08, 2022.  KUBs May 09, 2022 FINDINGS: A rectal tube terminates in the right upper quadrant, stable in position. There is increasing colonic dilatation measuring up to 12 cm in caliber. The dilatation has increased significantly since the most recent KUB. There is a sharp turn in the rectal tube as it exits the patient. No small bowel dilatation. Bilateral renal stones identified. An NG tube terminates in the stomach. No other abnormalities. IMPRESSION: 1. A rectal tube terminates in the right upper quadrant. There is a sharp turn/possible kink in the rectal tube is at exits the patient. There is significant increased colonic dilatation since the most recent KUB measuring up to 12 cm in caliber. 2. No other interval changes. These results will be called to the ordering clinician or representative by the Radiologist Assistant, and communication documented in the PACS or [employer]. Electronically Signed   By: Dorise Bullion III M.D.   On: 05/10/2022 08:10   DG Abd 1 View  Result Date: 05/09/2022 CLINICAL DATA:  Follow-up sigmoid volvulus. EXAM: ABDOMEN - 1 VIEW COMPARISON:  CT, 05/08/2022.  Radiographs, 05/09/2022 at 3:12 a.m. FINDINGS: Gas-filled loops of small bowel and colon. There is colonic dilation that is decreased from the prior radiographs and more significantly decreased when compared to the CT from 05/08/2022. Rectal/colonic tube tip projects in the right mid to upper abdomen. Stable suprapubic catheter. IMPRESSION: 1. Interval improvement with a decrease in colonic dilation compared to the exam earlier today, and  more significantly improved compared to the previous day's CT. Electronically Signed   By: Lajean Manes M.D.   On: 05/09/2022 09:34   DG Abd 1 View  Result Date: 05/09/2022 CLINICAL DATA:  Status post decompression of sigmoid volvulus EXAM: ABDOMEN - 1 VIEW COMPARISON:  CT abdomen and pelvis 05/08/2022 FINDINGS: Enteric tube tip in the stomach with side port not well visualized but likely near the gastroesophageal junction. Redemonstrated marked gaseous distention of the colon, similar to slightly decreased from previous CT. Foley catheter in the bladder. IMPRESSION: Similar to slight decrease in gaseous distention of the colon compared to prior CT. Electronically Signed   By: Placido Sou M.D.   On: 05/09/2022 03:52   CT ABDOMEN PELVIS WO CONTRAST  Result Date: 05/08/2022 CLINICAL DATA:  Suspected bowel obstruction. EXAM: CT ABDOMEN AND PELVIS WITHOUT CONTRAST TECHNIQUE: Multidetector CT imaging of the abdomen and pelvis was performed following the standard protocol without IV contrast. RADIATION DOSE REDUCTION: This exam was performed according to the departmental dose-optimization program which includes automated exposure control, adjustment of the mA and/or kV according to patient size and/or use of iterative reconstruction technique. COMPARISON:  March 25, 2022 FINDINGS: Lower chest: Marked severity consolidation is seen within the posterior aspect of the right lung base. Mild posterolateral left basilar atelectasis is noted. Hepatobiliary: No focal liver abnormality is seen. The gallbladder is contracted without evidence of gallstones, gallbladder wall thickening, or biliary dilatation. Pancreas: Unremarkable. No pancreatic ductal dilatation or surrounding inflammatory changes. Spleen: Normal in size without focal abnormality. Adrenals/Urinary Tract: Adrenal glands are unremarkable. Kidneys are normal in size with mild bilateral  renal cortical thinning noted. Multiple cysts are seen within the  right kidney. The largest measures approximately 6.9 cm x 4.9 cm. Numerous bilateral renal and proximal ureteral calculi of various sizes are seen. The largest on the right measures approximately 1.3 cm, while the largest on the left measures 1.0 cm. A suprapubic catheter is seen within an empty urinary bladder. Stomach/Bowel: A nasogastric tube is seen with its distal tip noted within the body of the stomach. Numerous markedly dilated large bowel loops are seen throughout the abdomen and pelvis. A transition zone is seen within the mid sigmoid colon. Twisting of the mesentery is noted within this region (axial CT images 78 through 87, CT series 2). The small bowel loops are predominantly decompressed. The appendix is normal in appearance. Vascular/Lymphatic: Aortic atherosclerosis. No enlarged abdominal or pelvic lymph nodes. Reproductive: The prostate gland is not identified. Other: There is moderate severity anasarca involving predominantly the posterolateral aspects of the abdominal and pelvic walls. No abdominopelvic ascites. Musculoskeletal: Marked severity multilevel degenerative changes seen throughout the lumbar spine. IMPRESSION: 1. 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 2. Marked severity right lower lobe consolidation, consistent with pneumonia. 3. Numerous bilateral renal and proximal ureteral calculi of various sizes. 4. Multiple right renal cysts (Bosniak 2). No additional follow-up or imaging is recommended. This recommendation follows ACR consensus guidelines: Management of the Incidental Renal Mass on CT: A White Paper of the ACR Incidental Findings Committee. J Am Coll Radiol [phone]. 5. Moderate severity anasarca. 6. Aortic atherosclerosis. Electronically Signed   By: Aram Candela M.D.   On: 05/08/2022 23:25   DG Abd 1 View  Result Date: 05/08/2022 CLINICAL DATA:  [id number] NG tube placement EXAM:  ABDOMEN - 1 VIEW COMPARISON:  May 08, 2022 FINDINGS: Incomplete visualization of the abdomen laterally and the pelvis. Carina is not visualized, limiting evaluation. Enteric tube tip projects over the expected region of the proximal stomach. Side port projects over the region of the distal esophagus. Marked gaseous dilation of loops of bowel, similar in comparison to prior. IMPRESSION: 1. Enteric tube side port projects over the distal esophagus. 2. Similar appearance of diffuse gaseous dilation of loops of bowel, possibly ileus. If concern for obstruction, recommend CT. Electronically Signed   By: Meda Klinefelter M.D.   On: 05/08/2022 12:08   DG Abd 1 View  Result Date: 05/08/2022 CLINICAL DATA:  Check gastric catheter placement EXAM: ABDOMEN - 1 VIEW COMPARISON:  Film from the previous day. FINDINGS: Gastric catheter is again noted in the stomach. Scattered large and small bowel gas is again seen and stable. IMPRESSION: Gastric catheter in the stomach. Electronically Signed   By: Alcide Clever M.D.   On: 05/08/2022 00:32   DG Abd 1 View  Result Date: 05/07/2022 CLINICAL DATA:  Check gastric catheter placement EXAM: ABDOMEN - 1 VIEW COMPARISON:  None Available. FINDINGS: Gastric catheter is noted within the stomach. Scattered large and small bowel gas is again identified. This likely represents an ileus. Correlate with clinical exam. Degenerative changes of lumbar spine are noted. IMPRESSION: Gastric catheter in place. Considerable gaseous distension of the bowel which may represent an ileus. Electronically Signed   By: Alcide Clever M.D.   On: 05/07/2022 19:32   DG Chest Port 1 View  Result Date: 05/06/2022 CLINICAL DATA:  Pneumonia EXAM: PORTABLE CHEST 1 VIEW COMPARISON:  05/04/2022 FINDINGS: Normal cardiac silhouette. Low lung volumes. Fine airspace disease in the RIGHT  upper lobe is slightly improved from comparison exam. Elevated LEFT hemidiaphragm. No acute osseous abnormality. IMPRESSION:  1. Slight improvement in RIGHT upper lobe pneumonia. 2. Low lung volumes. Electronically Signed   By: Genevive BiStewart  Edmunds M.D.   On: 05/06/2022 09:29   DG Chest Port 1 View  Result Date: 05/04/2022 CLINICAL DATA:  Shortness of breath EXAM: PORTABLE CHEST 1 VIEW COMPARISON:  05/02/2022 FINDINGS: Patchy airspace disease in the right upper lobe. Heart is normal size. Aortic calcifications. No confluent opacity on the left. No effusions or acute bony abnormality. IMPRESSION: Patchy airspace opacity in the right upper lobe concerning for pneumonia. Electronically Signed   By: Charlett NoseKevin  Dover M.D.   On: 05/04/2022 21:01     Assessment/Plan 1. 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  IVC filter is in place and remains indicated at present.  He continues to have chronic anemia and may not tolerate anticoagulation indefinitely.  The patient is currently on anticoagulation   Elevation was stressed, such as the use of a recliner.  I have discussed  DVT and post phlebitic changes such as swelling and why it  causes symptoms such as pain.  The patient should wear graduated compression stockings beginning after three full days of anticoagulation.  The compression should be worn on a daily basis. The patient should wearing the stockings first thing in the morning and removing them in the evening. The patient should not to sleep in the stockings.  In addition, behavioral modification including elevation during the day and avoidance of prolonged dependency will be initiated.    The patient will continue anticoagulation for now as there have not been any problems or complications at this point.    2. History of pulmonary embolus (PE) See #1  3. Primary hypertension Continue antihypertensive medications as already ordered, these medications have been reviewed and there are no changes at this time.   4. Anemia,  unspecified type See #1     Levora DredgeGregory Imanol Bihl, MD  06/03/2022 3:40 PM

## 2022-06-06 ENCOUNTER — Encounter (INDEPENDENT_AMBULATORY_CARE_PROVIDER_SITE_OTHER): Payer: Self-pay | Admitting: Vascular Surgery

## 2022-06-06 DIAGNOSIS — Z86711 Personal history of pulmonary embolism: Secondary | ICD-10-CM | POA: Insufficient documentation

## 2022-06-08 ENCOUNTER — Encounter: Payer: Self-pay | Admitting: Physician Assistant

## 2022-06-08 ENCOUNTER — Ambulatory Visit (INDEPENDENT_AMBULATORY_CARE_PROVIDER_SITE_OTHER): Payer: Medicare Other | Admitting: Physician Assistant

## 2022-06-08 DIAGNOSIS — R339 Retention of urine, unspecified: Secondary | ICD-10-CM | POA: Diagnosis not present

## 2022-06-08 NOTE — Progress Notes (Signed)
Suprapubic Cath Change  Patient is present today for a suprapubic catheter change due to urinary retention.  94ml of water was drained from the balloon, a 18FR Council tip foley cath was removed from the tract without difficulty.  Site was cleaned and prepped in a sterile fashion with betadine.  A 18FR Silastic foley cath was replaced into the tract no complications were noted. Urine return was noted, 10 ml of sterile water was inflated into the balloon and a night bag was attached for drainage.  Patient tolerated well.    Performed by: Debroah Loop, PA-C   Additional notes: [facility] was unable to reach them to schedule him for stone referral. I gave them the phone number to schedule today and urged them to call ASAP.  Follow up: Return in about 4 weeks (around 07/06/2022) for SPT exchange.

## 2022-06-08 NOTE — Patient Instructions (Signed)
Please call [facility] at [phone] to schedule your appointment to discuss treating your kidney stones.

## 2022-07-08 ENCOUNTER — Ambulatory Visit: Payer: Medicare Other | Admitting: Physician Assistant

## 2022-07-29 ENCOUNTER — Ambulatory Visit: Payer: Medicare Other | Admitting: Physician Assistant

## 2022-08-04 ENCOUNTER — Ambulatory Visit: Payer: Medicare Other | Admitting: Physician Assistant

## 2022-09-10 ENCOUNTER — Other Ambulatory Visit: Payer: Self-pay

## 2022-09-10 ENCOUNTER — Emergency Department
Admission: EM | Admit: 2022-09-10 | Discharge: 2022-09-10 | Disposition: A | Discharge status: Home / Self Care | Payer: Medicare Other | Attending: Student in an Organized Health Care Education/Training Program | Admitting: Student in an Organized Health Care Education/Training Program

## 2022-09-10 DIAGNOSIS — R109 Unspecified abdominal pain: Secondary | ICD-10-CM | POA: Diagnosis present

## 2022-09-10 DIAGNOSIS — N3090 Cystitis, unspecified without hematuria: Secondary | ICD-10-CM | POA: Insufficient documentation

## 2022-09-10 LAB — LIPASE, BLOOD: Lipase: 38 U/L (ref 11–51)

## 2022-09-10 LAB — COMPREHENSIVE METABOLIC PANEL
ALT: 13 U/L (ref 0–44)
AST: 16 U/L (ref 15–41)
Albumin: 2.4 g/dL — ABNORMAL LOW (ref 3.5–5.0)
Alkaline Phosphatase: 78 U/L (ref 38–126)
Anion gap: 5 (ref 5–15)
BUN: 19 mg/dL (ref 8–23)
CO2: 30 mmol/L (ref 22–32)
Calcium: 8.1 mg/dL — ABNORMAL LOW (ref 8.9–10.3)
Chloride: 106 mmol/L (ref 98–111)
Creatinine, Ser: 1.1 mg/dL (ref 0.61–1.24)
GFR, Estimated: >60 mL/min (ref >60)
Glucose, Bld: 101 mg/dL — ABNORMAL HIGH (ref 70–99)
Potassium: 4 mmol/L (ref 3.5–5.1)
Sodium: 141 mmol/L (ref 135–145)
Total Bilirubin: 0.6 mg/dL (ref 0.3–1.2)
Total Protein: 6 g/dL — ABNORMAL LOW (ref 6.5–8.1)

## 2022-09-10 LAB — CBC WITH DIFFERENTIAL/PLATELET
Abs Immature Granulocytes: 0.01 10*3/uL (ref 0.00–0.07)
Basophils Absolute: 0 10*3/uL (ref 0.0–0.1)
Basophils Relative: 1 %
Eosinophils Absolute: 0.1 10*3/uL (ref 0.0–0.5)
Eosinophils Relative: 2 %
HCT: 32.2 % — ABNORMAL LOW (ref 39.0–52.0)
Hemoglobin: 10.1 g/dL — ABNORMAL LOW (ref 13.0–17.0)
Immature Granulocytes: 0 %
Lymphocytes Relative: 26 %
Lymphs Abs: 1.5 10*3/uL (ref 0.7–4.0)
MCH: 28.8 pg (ref 26.0–34.0)
MCHC: 31.4 g/dL (ref 30.0–36.0)
MCV: 91.7 fL (ref 80.0–100.0)
Monocytes Absolute: 0.4 10*3/uL (ref 0.1–1.0)
Monocytes Relative: 6 %
Neutro Abs: 3.9 10*3/uL (ref 1.7–7.7)
Neutrophils Relative %: 65 %
Platelets: 201 10*3/uL (ref 150–400)
RBC: 3.51 MIL/uL — ABNORMAL LOW (ref 4.22–5.81)
RDW: 13.9 % (ref 11.5–15.5)
WBC: 6 10*3/uL (ref 4.0–10.5)
nRBC: 0 % (ref 0.0–0.2)

## 2022-09-10 LAB — URINALYSIS, ROUTINE W REFLEX MICROSCOPIC
Bilirubin Urine: NEGATIVE
Glucose, UA: NEGATIVE mg/dL
Ketones, ur: NEGATIVE mg/dL
Nitrite: NEGATIVE
Protein, ur: 100 mg/dL — AB
RBC / HPF: >50 RBC/hpf (ref 0–5)
Specific Gravity, Urine: 1.011 (ref 1.005–1.030)
Squamous Epithelial / HPF: NONE SEEN /HPF (ref 0–5)
WBC, UA: >50 WBC/hpf (ref 0–5)
pH: 8 (ref 5.0–8.0)

## 2022-09-10 MED ORDER — CEPHALEXIN 500 MG PO CAPS
500.0000 mg | ORAL_CAPSULE | Freq: Once | ORAL | Status: AC
  Administered 2022-09-10: 500 mg via ORAL
  Filled 2022-09-10: qty 1

## 2022-09-10 MED ORDER — CEPHALEXIN 500 MG PO CAPS: 500.0000 mg | ORAL_CAPSULE | Freq: Three times a day (TID) | ORAL | 0 refills | Status: AC

## 2022-09-10 NOTE — ED Triage Notes (Signed)
Pt brought in by EMS from [facility] for abd pain and UTI symptoms. Hx of dementia, mentally at his baseline per EMS. Pt is afebrile and vitals are stable at this time.

## 2022-09-10 NOTE — ED Provider Notes (Signed)
[facility] Provider Note    Event Date/Time   First MD Initiated Contact with Patient 09/10/22 2051     (approximate)   History   Abdominal Pain   HPI  [name] is a 82 y.o. male presents from [facility] with report of episode of abdominal pain as well as symptoms of UTI.  States his urine is darker colored more sediment.  Has had some chills.  No nausea or vomiting.  Denies any chest pain or shortness of breath.  Denies any injury.  No lower extremity swelling.      Physical Exam   Triage Vital Signs: ED Triage Vitals  Enc Vitals Group     BP 09/10/22 2035 126/72     Pulse Rate 09/10/22 2035 78     Resp 09/10/22 2035 16     Temp 09/10/22 2035 99.1 F (37.3 C)     Temp Source 09/10/22 2035 Oral     SpO2 09/10/22 2035 98 %     Weight 09/10/22 2038 217 lb 13 oz (98.8 kg)     Height 09/10/22 2038 5\' 10"  (1.778 m)     Head Circumference --      [facility] Flow --      Pain Score 09/10/22 2037 5     Pain Loc --      Pain Edu? --      Excl. in [place]? --     Most recent vital signs: Vitals:   09/10/22 2035  BP: 126/72  Pulse: 78  Resp: 16  Temp: 99.1 F (37.3 C)  SpO2: 98%     Constitutional: Alert, chronically ill-appearing Eyes: Conjunctivae are normal.  Head: Atraumatic. Nose: No congestion/rhinnorhea. Mouth/Throat: Mucous membranes are moist.   Neck: Painless ROM.  Cardiovascular:   Good peripheral circulation. Respiratory: Normal respiratory effort.  No retractions.  Gastrointestinal: Soft and nontender, ostomy appears well-perfused. Musculoskeletal:  no deformity Neurologic:  No gross focal neurologic deficits are appreciated.  Skin:  Skin is warm, dry and intact. No rash noted. Psychiatric: calm and cooperative    ED Results / Procedures / Treatments   Labs (all labs ordered are listed, but only abnormal results are displayed) Labs Reviewed  CBC WITH DIFFERENTIAL/PLATELET - Abnormal; Notable for the following  components:      Result Value   RBC 3.51 (*)    Hemoglobin 10.1 (*)    HCT 32.2 (*)    All other components within normal limits  COMPREHENSIVE METABOLIC PANEL - Abnormal; Notable for the following components:   Glucose, Bld 101 (*)    Calcium 8.1 (*)    Total Protein 6.0 (*)    Albumin 2.4 (*)    All other components within normal limits  URINALYSIS, ROUTINE W REFLEX MICROSCOPIC - Abnormal; Notable for the following components:   Color, Urine YELLOW (*)    APPearance TURBID (*)    Hgb urine dipstick MODERATE (*)    Protein, ur 100 (*)    Leukocytes,Ua LARGE (*)    Bacteria, UA MANY (*)    All other components within normal limits  LIPASE, BLOOD     EKG     RADIOLOGY    PROCEDURES:  Critical Care performed:   Procedures   MEDICATIONS ORDERED IN ED: Medications  cephALEXin (KEFLEX) capsule 500 mg (500 mg Oral Given 09/10/22 2219)     IMPRESSION / MDM / ASSESSMENT AND PLAN / ED COURSE  I reviewed the triage vital signs and the nursing  notes.                              Differential diagnosis includes, but is not limited to, UTI, sepsis, pyelonephritis, cystitis, colitis  Patient presenting to the ER for evaluation of symptoms as described above.  Based on symptoms, risk factors and considered above differential, this presenting complaint could reflect a potentially life-threatening illness therefore the patient will be placed on continuous pulse oximetry and telemetry for monitoring.  Laboratory evaluation will be sent to evaluate for the above complaints.  On arrival patient is pain-free no acute distress.  No fever no tachycardia no hypoxia.  Denies any discomfort will check labs and urine.   Clinical Course as of 09/10/22 2224  Fri Sep 10, 2022  2211 Patient's urinalysis is positive.  Appears to be chronically positive.  Review of most recent culture grew multiple species.  No documented history of ESBL.  He has tolerated both Glorioso cephalosporins over the  past few years.  Will give dose of Keflex. [PR]  2221 Blood work is reassuring.  Patient does appear stable and appropriate for outpatient follow-up.  Prescription sent to pharmacy. [PR]    Clinical Course User Index [PR] Merlyn Lot, MD   FINAL CLINICAL IMPRESSION(S) / ED DIAGNOSES   Final diagnoses:  Cystitis     Rx / DC Orders   ED Discharge Orders          Ordered    cephALEXin (KEFLEX) 500 MG capsule  3 times daily        09/10/22 2224             Note:  This document was prepared using Dragon voice recognition software and may include unintentional dictation errors.    Merlyn Lot, MD 09/10/22 2224

## 2022-09-10 NOTE — ED Notes (Signed)
Called for Urology cart

## 2022-09-10 NOTE — Discharge Instructions (Signed)
Follow up with PCP.  Return for any additional questions or concerns. °

## 2022-09-10 NOTE — ED Notes (Signed)
Report given to Travis RN

## 2022-09-10 NOTE — ED Notes (Signed)
[facility] called for transport back to [facility].

## 2022-09-12 ENCOUNTER — Emergency Department: Payer: Medicare Other

## 2022-09-12 ENCOUNTER — Emergency Department
Admission: EM | Admit: 2022-09-12 | Discharge: 2022-09-12 | Disposition: A | Discharge status: Skilled Nursing Facility | Payer: Medicare Other | Attending: Emergency Medicine | Admitting: Emergency Medicine

## 2022-09-12 ENCOUNTER — Other Ambulatory Visit: Payer: Self-pay

## 2022-09-12 DIAGNOSIS — N390 Urinary tract infection, site not specified: Secondary | ICD-10-CM | POA: Diagnosis not present

## 2022-09-12 DIAGNOSIS — Y732 Prosthetic and other implants, materials and accessory gastroenterology and urology devices associated with adverse incidents: Secondary | ICD-10-CM | POA: Diagnosis not present

## 2022-09-12 DIAGNOSIS — T83511D Infection and inflammatory reaction due to indwelling urethral catheter, subsequent encounter: Secondary | ICD-10-CM | POA: Insufficient documentation

## 2022-09-12 DIAGNOSIS — T83090A Other mechanical complication of cystostomy catheter, initial encounter: Secondary | ICD-10-CM | POA: Diagnosis not present

## 2022-09-12 DIAGNOSIS — R1030 Lower abdominal pain, unspecified: Secondary | ICD-10-CM | POA: Diagnosis present

## 2022-09-12 DIAGNOSIS — J449 Chronic obstructive pulmonary disease, unspecified: Secondary | ICD-10-CM | POA: Diagnosis not present

## 2022-09-12 DIAGNOSIS — I5030 Unspecified diastolic (congestive) heart failure: Secondary | ICD-10-CM | POA: Diagnosis not present

## 2022-09-12 DIAGNOSIS — I130 Hypertensive heart and chronic kidney disease with heart failure and stage 1 through stage 4 chronic kidney disease, or unspecified chronic kidney disease: Secondary | ICD-10-CM | POA: Insufficient documentation

## 2022-09-12 DIAGNOSIS — N189 Chronic kidney disease, unspecified: Secondary | ICD-10-CM | POA: Diagnosis not present

## 2022-09-12 LAB — CBC WITH DIFFERENTIAL/PLATELET
Abs Immature Granulocytes: 0.01 10*3/uL (ref 0.00–0.07)
Basophils Absolute: 0.1 10*3/uL (ref 0.0–0.1)
Basophils Relative: 1 %
Eosinophils Absolute: 0.1 10*3/uL (ref 0.0–0.5)
Eosinophils Relative: 3 %
HCT: 32.5 % — ABNORMAL LOW (ref 39.0–52.0)
Hemoglobin: 10.2 g/dL — ABNORMAL LOW (ref 13.0–17.0)
Immature Granulocytes: 0 %
Lymphocytes Relative: 32 %
Lymphs Abs: 1.5 10*3/uL (ref 0.7–4.0)
MCH: 28.5 pg (ref 26.0–34.0)
MCHC: 31.4 g/dL (ref 30.0–36.0)
MCV: 90.8 fL (ref 80.0–100.0)
Monocytes Absolute: 0.3 10*3/uL (ref 0.1–1.0)
Monocytes Relative: 7 %
Neutro Abs: 2.6 10*3/uL (ref 1.7–7.7)
Neutrophils Relative %: 57 %
Platelets: 181 10*3/uL (ref 150–400)
RBC: 3.58 MIL/uL — ABNORMAL LOW (ref 4.22–5.81)
RDW: 13.7 % (ref 11.5–15.5)
WBC: 4.5 10*3/uL (ref 4.0–10.5)
nRBC: 0 % (ref 0.0–0.2)

## 2022-09-12 LAB — COMPREHENSIVE METABOLIC PANEL
ALT: 13 U/L (ref 0–44)
AST: 13 U/L — ABNORMAL LOW (ref 15–41)
Albumin: 2.5 g/dL — ABNORMAL LOW (ref 3.5–5.0)
Alkaline Phosphatase: 92 U/L (ref 38–126)
Anion gap: 5 (ref 5–15)
BUN: 18 mg/dL (ref 8–23)
CO2: 28 mmol/L (ref 22–32)
Calcium: 8.2 mg/dL — ABNORMAL LOW (ref 8.9–10.3)
Chloride: 109 mmol/L (ref 98–111)
Creatinine, Ser: 1.08 mg/dL (ref 0.61–1.24)
GFR, Estimated: >60 mL/min (ref >60)
Glucose, Bld: 88 mg/dL (ref 70–99)
Potassium: 3.7 mmol/L (ref 3.5–5.1)
Sodium: 142 mmol/L (ref 135–145)
Total Bilirubin: 0.6 mg/dL (ref 0.3–1.2)
Total Protein: 6.2 g/dL — ABNORMAL LOW (ref 6.5–8.1)

## 2022-09-12 LAB — URINALYSIS, ROUTINE W REFLEX MICROSCOPIC
Bilirubin Urine: NEGATIVE
Glucose, UA: NEGATIVE mg/dL
Ketones, ur: 5 mg/dL — AB
Nitrite: NEGATIVE
Protein, ur: >=300 mg/dL — AB
RBC / HPF: >50 RBC/hpf (ref 0–5)
Specific Gravity, Urine: 1.014 (ref 1.005–1.030)
Squamous Epithelial / HPF: NONE SEEN /HPF (ref 0–5)
WBC, UA: >50 WBC/hpf (ref 0–5)
pH: 7 (ref 5.0–8.0)

## 2022-09-12 LAB — LIPASE, BLOOD: Lipase: 28 U/L (ref 11–51)

## 2022-09-12 MED ORDER — CEFDINIR 300 MG PO CAPS: 300.0000 mg | ORAL_CAPSULE | Freq: Two times a day (BID) | ORAL | 0 refills | Status: DC

## 2022-09-12 MED ORDER — SODIUM CHLORIDE 0.9 % IV SOLN
1.0000 g | Freq: Once | INTRAVENOUS | Status: AC
  Administered 2022-09-12: 1 g via INTRAVENOUS
  Filled 2022-09-12: qty 10

## 2022-09-12 MED ORDER — IOHEXOL 300 MG/ML  SOLN: 100.0000 mL | Freq: Once | INTRAMUSCULAR | Status: DC | PRN

## 2022-09-12 MED ORDER — CEFDINIR 300 MG PO CAPS: 300.0000 mg | ORAL_CAPSULE | Freq: Two times a day (BID) | ORAL | 0 refills | Status: AC

## 2022-09-12 NOTE — ED Triage Notes (Signed)
Pt BIB [facility] for supapubic catheter pain. Pt here in the ED a few days ago and discharged on keflex.   Pt from [facility].   Bladder scan done in ER is 152

## 2022-09-12 NOTE — ED Notes (Signed)
As per EMS, pt is at baseline. Pt is answering questions

## 2022-09-12 NOTE — ED Notes (Signed)
CT informed this RN that his IV infiltrated when in CT. Arm is bandaged. EDP aware and stated CT called them and informed them.

## 2022-09-12 NOTE — Discharge Instructions (Signed)
The suprapubic catheter was exchanged and is draining properly today.  Please continue to take the antibiotics.  Please follow-up with urology for discussion of management of your kidney stones.  We did see inflammation around your rectal pouch today.  Please follow-up with general surgery regarding this.

## 2022-09-12 NOTE — ED Notes (Signed)
EMS called to pick patient up for tranport back to [facility]

## 2022-09-12 NOTE — ED Provider Notes (Signed)
[facility] Provider Note    Event Date/Time   First MD Initiated Contact with Patient 09/12/22 0825     (approximate)   History   Abdominal Pain (Suprapubic catheter)   HPI  [name] is a 82 y.o. male with past medical history of hypertension hyperlipidemia GERD COPD diastolic heart failure BPH who presents with lower abdominal pain.  Patient tells me that he has been having pain around the suprapubic catheter for about 5 days.  He has chronic suprapubic catheter for unclear reasons to me.  He cannot tell me why.  Was seen in the ED 2 days ago because the catheter was not draining.  He was diagnosed with likely UTI started on Keflex and was discharged back to the facility.  Per EMS they were told that patient was having some lower abdominal pain and that there was minimal drainage of urine into the bag but patient was urinating via his urethra.  Patient denies abdominal pain nausea vomiting fevers chills cough or shortness of breath.    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 difficile diarrhea 05/2016   history of ...   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   Schizophrenia ([place])    Seizure ([place])    on phenobarb   Urinary retention    frequently requiring foley placement   VRE (vancomycin resistant enterococcus) culture positive    urine    Patient Active Problem List   Diagnosis Date Noted   History of pulmonary embolus (PE) 70/62/3762   Metabolic acidosis, normal anion gap (NAG) 05/15/2022   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   Acute deep vein thrombosis (DVT) of  left lower extremity (HCC)    Acute respiratory failure with hypoxia (HCC)    Malnutrition of moderate degree 05/03/2022   COPD exacerbation ([place]) 05/02/2022   Pulmonary embolism ([place]) 05/02/2022   Dysphagia 01/12/2022   Cellulitis of left lower extremity 01/10/2022   Hypomagnesemia 01/10/2022   Hypertension    Depression    Chronic diastolic CHF (congestive heart failure) (HCC)    Chronic kidney disease, 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[place])    Acute metabolic encephalopathy    Anemia    Hematuria 11/25/2016   Guaiac positive stools 11/25/2016   Pressure injury of skin 09/23/2016   Back pain 05/28/2016   Sepsis ([place]) 05/27/2016   UTI (urinary tract infection) 05/27/2016   DVT of lower extremity, bilateral ([place]) 05/27/2016   Bladder outlet obstruction 05/27/2016   BPH (benign prostatic hyperplasia) 05/27/2016   Acute on chronic renal failure ([place]) 05/27/2016   Bipolar disorder ([place]) 05/27/2016   Generalized weakness 05/27/2016   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   Hypokalemia 02/21/2015   Venous insufficiency of both lower extremities 12/04/2014   Benign essential hypertension 11/25/2014   Bilateral carotid artery stenosis 11/15/2014   Bilateral leg edema 11/15/2014   COPD (chronic obstructive pulmonary disease) ([place]) 11/15/2014   Gastroesophageal reflux disease 11/15/2014   Mild aortic  valve stenosis 11/15/2014   Mixed hyperlipidemia 11/15/2014   Obesity 11/15/2014   Seizure disorder ([place]) 11/15/2014     Physical Exam  Triage Vital Signs: ED Triage Vitals [09/12/22 0826]  Enc Vitals Group     BP 121/64     Pulse Rate 62     Resp 15     Temp 97.6 F (36.4 C)     Temp src      SpO2 94 %     Weight 217 lb 13 oz (98.8 kg)     Height 5\' 10"  (1.778 m)     Head Circumference      [facility] Flow      Pain Score 7     Pain Loc      Pain Edu?      Excl. in [place]?     Most recent vital signs: Vitals:    09/12/22 0826  BP: 121/64  Pulse: 62  Resp: 15  Temp: 97.6 F (36.4 C)  SpO2: 94%     General: Awake, no distress appears chronically ill CV:  Good peripheral perfusion.  Pitting edema bilateral lower extremities with chronic venous stasis changes Resp:  Normal effort.  Abd:  No distention.  Abdomen is soft minimal tenderness around the suprapubic catheter there is no surrounding erythema, dark yellow urine a minimal amount is in the bag Ostomy in place appears healthy Neuro:             Awake, Alert, Oriented x 1 Other:  Patient is able to communicate but speaks quite slowly, affect is flat   ED Results / Procedures / Treatments  Labs (all labs ordered are listed, but only abnormal results are displayed) Labs Reviewed  COMPREHENSIVE METABOLIC PANEL - Abnormal; Notable for the following components:      Result Value   Calcium 8.2 (*)    Total Protein 6.2 (*)    Albumin 2.5 (*)    AST 13 (*)    All other components within normal limits  CBC WITH DIFFERENTIAL/PLATELET - Abnormal; Notable for the following components:   RBC 3.58 (*)    Hemoglobin 10.2 (*)    HCT 32.5 (*)    All other components within normal limits  URINALYSIS, ROUTINE W REFLEX MICROSCOPIC - Abnormal; Notable for the following components:   Color, Urine YELLOW (*)    APPearance TURBID (*)    Hgb urine dipstick SMALL (*)    Ketones, ur 5 (*)    Protein, ur >=300 (*)    Leukocytes,Ua LARGE (*)    Bacteria, UA MANY (*)    All other components within normal limits  URINE CULTURE  LIPASE, BLOOD     EKG     RADIOLOGY Reviewed the CT abdomen pelvis with contrast which shows bilateral renal calculi staghorn   PROCEDURES:  Critical Care performed: No  .1-3 Lead EKG Interpretation  Performed by: Rada Hay, MD Authorized by: Rada Hay, MD     Interpretation: normal     ECG rate assessment: normal     Rhythm: sinus rhythm     Ectopy: none     Conduction: normal     The  patient is on the cardiac monitor to evaluate for evidence of arrhythmia and/or significant heart rate changes.   MEDICATIONS ORDERED IN ED: Medications  iohexol (OMNIPAQUE) 300 MG/ML solution 100 mL ( Intravenous Canceled Entry 09/12/22 0949)  cefTRIAXone (ROCEPHIN) 1 g in sodium chloride 0.9 % 100 mL IVPB (1 g Intravenous New  Bag/Given (Non-Interop) 09/12/22 1020)     IMPRESSION / MDM / ASSESSMENT AND PLAN / ED COURSE  I reviewed the triage vital signs and the nursing notes.                              Patient's presentation is most consistent with acute complicated illness / injury requiring diagnostic workup.  Differential diagnosis includes, but is not limited to, catheter blockage, catheter malfunction, UTI, pyelonephritis, bowel obstruction  The patient is a 82 year old male chronically ill with suprapubic catheter in place presents because of suprapubic pain and catheter issues.  Was seen in the ED 2 days ago diagnosed with UTI catheter was not exchanged.  Once again today EMS says they were called out for abdominal pain but when they got there facility said it was actually a catheter issue and that the catheter has not been draining much but he has been wetting his bed urinating via his urethra.  Patient points to suprapubic region when asked about location of his pain.  He can provide some history tells me it has been uncomfortable for about 5 days.  On exam he looks chronically ill he has mild suprapubic tenderness but abdominal exam is overall benign.  Suprapubic catheter has some yellow urine in the bag.  Bladder scan performed by nursing shows 150 cc of urine.  Given patient did have a significant mount of pyuria 2 days ago on UA I think it is best to just exchange the catheter as opposed to trying to flush it.  I did this at bedside when I remove the old Foley a large amount of urine drained through the cath site.  I replaced the new catheter easily.  Plan to send urinalysis for  urine culture.  Given patient's chronically ill with multiple comorbidities and abdominal pain I will obtain a CT of the abdomen pelvis to rule out obstruction or other process but I suspect that his pain was likely related to the incompletely draining catheter.  Patient's labs overall reassuring no leukocytosis, hemoglobin is at baseline.  LFTs and lipase largely within normal limits.  Electrolytes normal.    Patient CT of the abdomen pelvis does show bilateral staghorn calculi with left hydroureter.  UA is grossly dirty again today and urine culture has been sent.  I did discuss with Dr. Erlene Quan if any urgent intervention needed to happen and she agrees that because he is not clinically septic afebrile without elevated white count that this is a chronic issue and can be managed by [facility].  I do see that he follows with [facility] urology and there was discussion of bilateral PCN placement which has not been done yet.  On CT there is also notable inflammation around the [facility] which could represent proctitis.   I will have him follow-up with general surgery regarding this.   FINAL CLINICAL IMPRESSION(S) / ED DIAGNOSES   Final diagnoses:  Urinary tract infection associated with indwelling urethral catheter, subsequent encounter  Obstruction of suprapubic catheter, initial encounter [facility])     Rx / DC Orders   ED Discharge Orders     None        Note:  This document was prepared using Dragon voice recognition software and may include unintentional dictation errors.   Rada Hay, MD 09/12/22 [phone]

## 2022-09-12 NOTE — ED Notes (Signed)
CT informed this RN that

## 2022-09-16 ENCOUNTER — Ambulatory Visit (INDEPENDENT_AMBULATORY_CARE_PROVIDER_SITE_OTHER): Payer: Medicare Other | Admitting: Physician Assistant

## 2022-09-16 DIAGNOSIS — R339 Retention of urine, unspecified: Secondary | ICD-10-CM

## 2022-09-16 NOTE — Progress Notes (Signed)
Patient is on my schedule today for SP tube change, appointment made 7 days ago.  Per chart review, SP tube was exchanged in our emergency department by Dr. Starleen Blue 4 days ago.  No need for SPT change today.  He is scheduled to initiate staged bilateral PCNL's at [facility] next month.  Encouraged him to follow-up with [facility] as scheduled.

## 2022-09-21 NOTE — Progress Notes (Signed)
ED Antimicrobial Stewardship Positive Culture Follow Up   [name] is an 82 y.o. male who presented to [facility] on 09/12/2022 with a chief complaint of  Chief Complaint  Patient presents with   Abdominal Pain    Suprapubic catheter    Recent Results (from the past 720 hour(s))  Urine Culture (for pregnant, neutropenic or urologic patients or patients with an indwelling urinary catheter)     Status: Abnormal   Collection Time: 09/12/22  9:25 AM   Specimen: Urine, Catheterized  Result Value Ref Range Status   Specimen Description   Final    URINE, CATHETERIZED Performed at [facility], [address], [place], [place] [address]    Special Requests   Final    NONE Performed at [facility], [address], [place], [place] [address]    Culture (A)  Final    >=100,000 COLONIES/mL MORGANELLA MORGANII >=100,000 COLONIES/mL ENTEROCOCCUS FAECALIS >=100,000 COLONIES/mL ESCHERICHIA COLI VANCOMYCIN RESISTANT ENTEROCOCCUS ISOLATED    Report Status 09/20/2022 FINAL  Final   Organism ID, Bacteria MORGANELLA MORGANII (A)  Final   Organism ID, Bacteria ENTEROCOCCUS FAECALIS (A)  Final   Organism ID, Bacteria ESCHERICHIA COLI (A)  Final      Susceptibility   Escherichia coli - MIC*    AMPICILLIN >=32 RESISTANT Resistant     CEFAZOLIN <=4 SENSITIVE Sensitive     CEFTRIAXONE <=0.25 SENSITIVE Sensitive     CIPROFLOXACIN >=4 RESISTANT Resistant     GENTAMICIN >=16 RESISTANT Resistant     IMIPENEM <=0.25 SENSITIVE Sensitive     NITROFURANTOIN <=16 SENSITIVE Sensitive     TRIMETH/SULFA <=20 SENSITIVE Sensitive     AMPICILLIN/SULBACTAM 16 INTERMEDIATE Intermediate     * >=100,000 COLONIES/mL ESCHERICHIA COLI   Enterococcus faecalis - MIC*    AMPICILLIN <=2 SENSITIVE Sensitive     NITROFURANTOIN <=16 SENSITIVE Sensitive     VANCOMYCIN >=32 RESISTANT Resistant     LINEZOLID 1 SENSITIVE Sensitive     * >=100,000 COLONIES/mL ENTEROCOCCUS FAECALIS    [x]$  Treated  with cefdinir, organism resistant to prescribed antimicrobial []$  Patient discharged originally without antimicrobial agent and treatment is now indicated  New antibiotic prescription: N/A  Culture now growing Enterococcus faecalis, collected on 2/4.  Patient has suprapubic catheter that was exchanged while in the ED.  Discussed results with Dr. Cheri Fowler, could represent colonization. No additional treatment ordered at this time.  ED Provider: Dr. Fletcher Anon ,[facility] Clinical Pharmacist  09/21/2022, 1:36 PM

## 2022-09-24 LAB — URINE CULTURE: Culture: >=100000 — AB

## 2022-12-19 ENCOUNTER — Emergency Department: Payer: Medicare Other

## 2022-12-19 ENCOUNTER — Encounter: Payer: Self-pay | Admitting: *Deleted

## 2022-12-19 ENCOUNTER — Inpatient Hospital Stay
Admission: EM | Admit: 2022-12-19 | Discharge: 2022-12-23 | DRG: 698 | Disposition: A | Discharge status: Skilled Nursing Facility | Payer: Medicare Other | Source: Skilled Nursing Facility | Attending: Internal Medicine | Admitting: Internal Medicine

## 2022-12-19 ENCOUNTER — Other Ambulatory Visit: Payer: Self-pay

## 2022-12-19 DIAGNOSIS — Z79899 Other long term (current) drug therapy: Secondary | ICD-10-CM

## 2022-12-19 DIAGNOSIS — Z6832 Body mass index (BMI) 32.0-32.9, adult: Secondary | ICD-10-CM

## 2022-12-19 DIAGNOSIS — J449 Chronic obstructive pulmonary disease, unspecified: Secondary | ICD-10-CM | POA: Diagnosis present

## 2022-12-19 DIAGNOSIS — T83518A Infection and inflammatory reaction due to other urinary catheter, initial encounter: Principal | ICD-10-CM | POA: Diagnosis present

## 2022-12-19 DIAGNOSIS — N183 Chronic kidney disease, stage 3 unspecified: Secondary | ICD-10-CM | POA: Diagnosis present

## 2022-12-19 DIAGNOSIS — N179 Acute kidney failure, unspecified: Secondary | ICD-10-CM | POA: Diagnosis present

## 2022-12-19 DIAGNOSIS — G40909 Epilepsy, unspecified, not intractable, without status epilepticus: Secondary | ICD-10-CM | POA: Diagnosis present

## 2022-12-19 DIAGNOSIS — Z95828 Presence of other vascular implants and grafts: Secondary | ICD-10-CM | POA: Diagnosis not present

## 2022-12-19 DIAGNOSIS — Z9049 Acquired absence of other specified parts of digestive tract: Secondary | ICD-10-CM

## 2022-12-19 DIAGNOSIS — Z1152 Encounter for screening for COVID-19: Secondary | ICD-10-CM

## 2022-12-19 DIAGNOSIS — R5381 Other malaise: Secondary | ICD-10-CM | POA: Diagnosis present

## 2022-12-19 DIAGNOSIS — E669 Obesity, unspecified: Secondary | ICD-10-CM | POA: Diagnosis present

## 2022-12-19 DIAGNOSIS — R6520 Severe sepsis without septic shock: Secondary | ICD-10-CM | POA: Diagnosis present

## 2022-12-19 DIAGNOSIS — Z880 Allergy status to penicillin: Secondary | ICD-10-CM

## 2022-12-19 DIAGNOSIS — N12 Tubulo-interstitial nephritis, not specified as acute or chronic: Secondary | ICD-10-CM | POA: Diagnosis present

## 2022-12-19 DIAGNOSIS — N390 Urinary tract infection, site not specified: Secondary | ICD-10-CM | POA: Diagnosis not present

## 2022-12-19 DIAGNOSIS — Z9359 Other cystostomy status: Secondary | ICD-10-CM | POA: Diagnosis not present

## 2022-12-19 DIAGNOSIS — I82402 Acute embolism and thrombosis of unspecified deep veins of left lower extremity: Secondary | ICD-10-CM | POA: Diagnosis present

## 2022-12-19 DIAGNOSIS — N3090 Cystitis, unspecified without hematuria: Secondary | ICD-10-CM

## 2022-12-19 DIAGNOSIS — N400 Enlarged prostate without lower urinary tract symptoms: Secondary | ICD-10-CM | POA: Diagnosis present

## 2022-12-19 DIAGNOSIS — N200 Calculus of kidney: Secondary | ICD-10-CM | POA: Diagnosis not present

## 2022-12-19 DIAGNOSIS — I5032 Chronic diastolic (congestive) heart failure: Secondary | ICD-10-CM | POA: Diagnosis present

## 2022-12-19 DIAGNOSIS — F0393 Unspecified dementia, unspecified severity, with mood disturbance: Secondary | ICD-10-CM | POA: Diagnosis present

## 2022-12-19 DIAGNOSIS — Z86718 Personal history of other venous thrombosis and embolism: Secondary | ICD-10-CM

## 2022-12-19 DIAGNOSIS — Z7951 Long term (current) use of inhaled steroids: Secondary | ICD-10-CM

## 2022-12-19 DIAGNOSIS — F319 Bipolar disorder, unspecified: Secondary | ICD-10-CM | POA: Diagnosis present

## 2022-12-19 DIAGNOSIS — Y846 Urinary catheterization as the cause of abnormal reaction of the patient, or of later complication, without mention of misadventure at the time of the procedure: Secondary | ICD-10-CM | POA: Diagnosis present

## 2022-12-19 DIAGNOSIS — G9341 Metabolic encephalopathy: Secondary | ICD-10-CM | POA: Diagnosis present

## 2022-12-19 DIAGNOSIS — I130 Hypertensive heart and chronic kidney disease with heart failure and stage 1 through stage 4 chronic kidney disease, or unspecified chronic kidney disease: Secondary | ICD-10-CM | POA: Diagnosis present

## 2022-12-19 DIAGNOSIS — E7800 Pure hypercholesterolemia, unspecified: Secondary | ICD-10-CM | POA: Diagnosis present

## 2022-12-19 DIAGNOSIS — N136 Pyonephrosis: Secondary | ICD-10-CM | POA: Diagnosis present

## 2022-12-19 DIAGNOSIS — Z86711 Personal history of pulmonary embolism: Secondary | ICD-10-CM

## 2022-12-19 DIAGNOSIS — N182 Chronic kidney disease, stage 2 (mild): Secondary | ICD-10-CM | POA: Diagnosis not present

## 2022-12-19 DIAGNOSIS — R4182 Altered mental status, unspecified: Secondary | ICD-10-CM

## 2022-12-19 DIAGNOSIS — E785 Hyperlipidemia, unspecified: Secondary | ICD-10-CM | POA: Diagnosis not present

## 2022-12-19 DIAGNOSIS — Z933 Colostomy status: Secondary | ICD-10-CM | POA: Diagnosis not present

## 2022-12-19 DIAGNOSIS — L89312 Pressure ulcer of right buttock, stage 2: Secondary | ICD-10-CM | POA: Diagnosis present

## 2022-12-19 DIAGNOSIS — I10 Essential (primary) hypertension: Secondary | ICD-10-CM | POA: Diagnosis not present

## 2022-12-19 DIAGNOSIS — K219 Gastro-esophageal reflux disease without esophagitis: Secondary | ICD-10-CM | POA: Diagnosis present

## 2022-12-19 DIAGNOSIS — Z7901 Long term (current) use of anticoagulants: Secondary | ICD-10-CM

## 2022-12-19 DIAGNOSIS — A419 Sepsis, unspecified organism: Secondary | ICD-10-CM | POA: Diagnosis not present

## 2022-12-19 DIAGNOSIS — Z809 Family history of malignant neoplasm, unspecified: Secondary | ICD-10-CM

## 2022-12-19 DIAGNOSIS — N1330 Unspecified hydronephrosis: Secondary | ICD-10-CM | POA: Diagnosis not present

## 2022-12-19 DIAGNOSIS — R1030 Lower abdominal pain, unspecified: Secondary | ICD-10-CM

## 2022-12-19 DIAGNOSIS — Z978 Presence of other specified devices: Secondary | ICD-10-CM

## 2022-12-19 DIAGNOSIS — T83511A Infection and inflammatory reaction due to indwelling urethral catheter, initial encounter: Secondary | ICD-10-CM | POA: Diagnosis not present

## 2022-12-19 LAB — CBC WITH DIFFERENTIAL/PLATELET
Abs Immature Granulocytes: 0.02 10*3/uL (ref 0.00–0.07)
Basophils Absolute: 0 10*3/uL (ref 0.0–0.1)
Basophils Relative: 0 %
Eosinophils Absolute: 0.3 10*3/uL (ref 0.0–0.5)
Eosinophils Relative: 6 %
HCT: 33.4 % — ABNORMAL LOW (ref 39.0–52.0)
Hemoglobin: 10.1 g/dL — ABNORMAL LOW (ref 13.0–17.0)
Immature Granulocytes: 0 %
Lymphocytes Relative: 21 %
Lymphs Abs: 1 10*3/uL (ref 0.7–4.0)
MCH: 28.5 pg (ref 26.0–34.0)
MCHC: 30.2 g/dL (ref 30.0–36.0)
MCV: 94.1 fL (ref 80.0–100.0)
Monocytes Absolute: 0.2 10*3/uL (ref 0.1–1.0)
Monocytes Relative: 4 %
Neutro Abs: 3 10*3/uL (ref 1.7–7.7)
Neutrophils Relative %: 69 %
Platelets: 158 10*3/uL (ref 150–400)
RBC: 3.55 MIL/uL — ABNORMAL LOW (ref 4.22–5.81)
RDW: 17.2 % — ABNORMAL HIGH (ref 11.5–15.5)
WBC: 4.5 10*3/uL (ref 4.0–10.5)
nRBC: 0 % (ref 0.0–0.2)

## 2022-12-19 LAB — COMPREHENSIVE METABOLIC PANEL
ALT: 10 U/L (ref 0–44)
AST: 12 U/L — ABNORMAL LOW (ref 15–41)
Albumin: 2.4 g/dL — ABNORMAL LOW (ref 3.5–5.0)
Alkaline Phosphatase: 65 U/L (ref 38–126)
Anion gap: 8 (ref 5–15)
BUN: 41 mg/dL — ABNORMAL HIGH (ref 8–23)
CO2: 22 mmol/L (ref 22–32)
Calcium: 8.1 mg/dL — ABNORMAL LOW (ref 8.9–10.3)
Chloride: 109 mmol/L (ref 98–111)
Creatinine, Ser: 2.62 mg/dL — ABNORMAL HIGH (ref 0.61–1.24)
GFR, Estimated: 24 mL/min — ABNORMAL LOW (ref >60)
Glucose, Bld: 91 mg/dL (ref 70–99)
Potassium: 4.6 mmol/L (ref 3.5–5.1)
Sodium: 139 mmol/L (ref 135–145)
Total Bilirubin: 0.5 mg/dL (ref 0.3–1.2)
Total Protein: 5.7 g/dL — ABNORMAL LOW (ref 6.5–8.1)

## 2022-12-19 LAB — URINALYSIS, W/ REFLEX TO CULTURE (INFECTION SUSPECTED)
Bilirubin Urine: NEGATIVE
Glucose, UA: NEGATIVE mg/dL
Ketones, ur: NEGATIVE mg/dL
Nitrite: POSITIVE — AB
Protein, ur: 100 mg/dL — AB
RBC / HPF: >50 RBC/hpf (ref 0–5)
Specific Gravity, Urine: 1.015 (ref 1.005–1.030)
WBC, UA: >50 WBC/hpf (ref 0–5)
pH: 6 (ref 5.0–8.0)

## 2022-12-19 LAB — RESP PANEL BY RT-PCR (RSV, FLU A&B, COVID)  RVPGX2
Influenza A by PCR: NEGATIVE
Influenza B by PCR: NEGATIVE
Resp Syncytial Virus by PCR: NEGATIVE
SARS Coronavirus 2 by RT PCR: NEGATIVE

## 2022-12-19 LAB — APTT: aPTT: 36 seconds (ref 24–36)

## 2022-12-19 LAB — TROPONIN I (HIGH SENSITIVITY): Troponin I (High Sensitivity): 9 ng/L (ref <18)

## 2022-12-19 LAB — PROTIME-INR
INR: 1.6 — ABNORMAL HIGH (ref 0.8–1.2)
Prothrombin Time: 19.1 seconds — ABNORMAL HIGH (ref 11.4–15.2)

## 2022-12-19 LAB — CREATININE, URINE, RANDOM: Creatinine, Urine: 71 mg/dL

## 2022-12-19 LAB — SODIUM, URINE, RANDOM: Sodium, Ur: 57 mmol/L

## 2022-12-19 LAB — LACTIC ACID, PLASMA: Lactic Acid, Venous: 1.7 mmol/L (ref 0.5–1.9)

## 2022-12-19 MED ORDER — LACTATED RINGERS IV BOLUS (SEPSIS)
1000.0000 mL | Freq: Once | INTRAVENOUS | Status: AC
  Administered 2022-12-19: 1000 mL via INTRAVENOUS

## 2022-12-19 MED ORDER — SODIUM CHLORIDE 0.9 % IV SOLN
2.0000 g | Freq: Once | INTRAVENOUS | Status: AC
  Administered 2022-12-19: 2 g via INTRAVENOUS
  Filled 2022-12-19: qty 12.5

## 2022-12-19 MED ORDER — LIDOCAINE HCL URETHRAL/MUCOSAL 2 % EX GEL
1.0000 | Freq: Once | CUTANEOUS | Status: AC
  Administered 2022-12-19: 1
  Filled 2022-12-19: qty 10

## 2022-12-19 MED ORDER — ENOXAPARIN SODIUM 40 MG/0.4ML IJ SOSY: 40.0000 mg | PREFILLED_SYRINGE | INTRAMUSCULAR | Status: DC

## 2022-12-19 MED ORDER — ENOXAPARIN SODIUM 40 MG/0.4ML IJ SOSY
40.0000 mg | PREFILLED_SYRINGE | INTRAMUSCULAR | Status: DC
  Administered 2022-12-20: 40 mg via SUBCUTANEOUS
  Filled 2022-12-19: qty 0.4

## 2022-12-19 MED ORDER — ONDANSETRON HCL 4 MG/2ML IJ SOLN: 4.0000 mg | Freq: Four times a day (QID) | INTRAMUSCULAR | Status: DC | PRN

## 2022-12-19 MED ORDER — METRONIDAZOLE 500 MG/100ML IV SOLN
500.0000 mg | Freq: Once | INTRAVENOUS | Status: AC
  Administered 2022-12-19: 500 mg via INTRAVENOUS
  Filled 2022-12-19: qty 100

## 2022-12-19 MED ORDER — VANCOMYCIN HCL IN DEXTROSE 1-5 GM/200ML-% IV SOLN: 1000.0000 mg | Freq: Once | INTRAVENOUS | Status: DC

## 2022-12-19 MED ORDER — VANCOMYCIN HCL 2000 MG/400ML IV SOLN
2000.0000 mg | Freq: Once | INTRAVENOUS | Status: AC
  Administered 2022-12-19: 2000 mg via INTRAVENOUS
  Filled 2022-12-19: qty 400

## 2022-12-19 MED ORDER — ONDANSETRON HCL 4 MG PO TABS: 4.0000 mg | ORAL_TABLET | Freq: Four times a day (QID) | ORAL | Status: DC | PRN

## 2022-12-19 MED ORDER — SODIUM CHLORIDE 0.9 % IV SOLN: INTRAVENOUS | Status: DC

## 2022-12-19 NOTE — Progress Notes (Signed)
Elink following for sepsis protocol. 

## 2022-12-19 NOTE — Assessment & Plan Note (Signed)
Creatinine 2.6 on presentation with baseline creatinine of 1-1.4  Suspect obstructive component in the setting of hydrouteronephrosis Status post Foley exchange in the ER Does appear mildly intravascularly dry Monitor renal function with hydration Check FENa Renal consult as clinically indicated Consider nephrostomy tubes if decreased renal function persists.

## 2022-12-19 NOTE — ED Notes (Signed)
Rolled assessed backside/ sacrum, EDP and RN

## 2022-12-19 NOTE — ED Provider Notes (Signed)
Care assumed of patient from outgoing provider.  See their note for initial history, exam and plan.  Clinical Course as of 12/19/22 1714  Sun Dec 19, 2022  1507 Sepsis work up, [facility] and worsening confusion with fever. Tylenol before arrival.  Hypotensive - Suprapubic cath - exchanging. IVF 30/kg, CT a/p wo contrast d/t AKI. [facility] urology with bilateral nephrostomy tubes.   [SM]  1509 DVT/PE on eliquis [SM]  1657 Discussed with Dr. 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  Will come to evaluate the patient in the emergency department [SM]  1713 Dr. Alvester Morin stated that CT scan appeared similar at this time, patient currently stable.  Recommended admission to the hospitalist.  If patient started to decompensate would recommend an nephrostomy tube.  Did not feel that the patient needed to be n.p.o. at this time. [SM]    Clinical Course User Index [SM] Corena Herter, MD     Corena Herter, MD 12/19/22 1714

## 2022-12-19 NOTE — Progress Notes (Signed)
CODE SEPSIS - PHARMACY COMMUNICATION  **Broad Spectrum Antibiotics should be administered within 1 hour of Sepsis diagnosis**  Time Code Sepsis Called/Page Received: 1400  Antibiotics Ordered: Cefepime and Vancomcyin   Time of 1st antibiotic administration: 1431  Additional action taken by pharmacy: Contacted RN at 1407 about needing patient weight added to Epic  Gardner Candle, PharmD, BCPS Clinical Pharmacist 12/19/2022 2:36 PM

## 2022-12-19 NOTE — Assessment & Plan Note (Signed)
Stable from respiratory standpoint Continue home inhalers 

## 2022-12-19 NOTE — ED Provider Notes (Signed)
[facility] Provider Note    Event Date/Time   First MD Initiated Contact with Patient 12/19/22 1343     (approximate)   History   Chief Complaint Altered Mental Status and Fever   HPI  [name] is a 82 y.o. male with past medical history of hypertension, hyperlipidemia, COPD, diastolic CHF, DVT/PE on Eliquis, seizures, CKD, and bipolar disorder who presents to the ED for altered mental status.  Patient resides at [facility], who called EMS today due to worsening confusion over the past 24 hours along with cloudy output into his Foley collecting bag.  Patient also reported to be febrile at [facility] earlier this morning to 101.5, was subsequently given Tylenol about 2 hours prior to arrival.  Patient is awake and alert on arrival, complains of pain in his lower abdomen as well as in his right knee.  He denies any recent trauma to his knee.  He denies any cough, chest pain, shortness of breath, nausea, vomiting, or diarrhea.  He is unsure when his suprapubic catheter was last exchanged.     Physical Exam   Triage Vital Signs: ED Triage Vitals [12/19/22 1349]  Enc Vitals Group     BP      Pulse      Resp      Temp      Temp src      SpO2 98 %     Weight      Height      Head Circumference      [facility]      Pain Score      Pain Loc      Pain Edu?      Excl. in GC?     Most recent vital signs: Vitals:   12/19/22 1500 12/19/22 1510  BP: (!) 103/91 (!) 115/94  Pulse:  81  Resp: 12   Temp:    SpO2:  100%    Constitutional: Alert and oriented to person, place, and time but not situation. Eyes: Conjunctivae are normal. Head: Atraumatic. Nose: No congestion/rhinnorhea. Mouth/Throat: Mucous membranes are moist.  Cardiovascular: Normal rate, regular rhythm. Grossly normal heart sounds.  1+ radial and DP pulses bilaterally. Respiratory: Normal respiratory effort.  No retractions. Lungs CTAB. Gastrointestinal: Soft and tender to  palpation diffusely, greatest in the bilateral lower quadrants. No distention. Genitourinary: Suprapubic catheter in place draining dark cloudy urine. Musculoskeletal: Diffuse tenderness to palpation of right knee with no obvious deformity, no overlying erythema, warmth, or edema noted.  Sacral area with some skin breakdown but no erythema, warmth, or purulence noted. Neurologic:  Normal speech and language. No gross focal neurologic deficits are appreciated.    ED Results / Procedures / Treatments   Labs (all labs ordered are listed, but only abnormal results are displayed) Labs Reviewed  COMPREHENSIVE METABOLIC PANEL - Abnormal; Notable for the following components:      Result Value   BUN 41 (*)    Creatinine, Ser 2.62 (*)    Calcium 8.1 (*)    Total Protein 5.7 (*)    Albumin 2.4 (*)    AST 12 (*)    GFR, Estimated 24 (*)    All other components within normal limits  CBC WITH DIFFERENTIAL/PLATELET - Abnormal; Notable for the following components:   RBC 3.55 (*)    Hemoglobin 10.1 (*)    HCT 33.4 (*)    RDW 17.2 (*)    All other components within normal  limits  PROTIME-INR - Abnormal; Notable for the following components:   Prothrombin Time 19.1 (*)    INR 1.6 (*)    All other components within normal limits  RESP PANEL BY RT-PCR (RSV, FLU A&B, COVID)  RVPGX2  CULTURE, BLOOD (ROUTINE X 2)  CULTURE, BLOOD (ROUTINE X 2)  LACTIC ACID, PLASMA  APTT  URINALYSIS, W/ REFLEX TO CULTURE (INFECTION SUSPECTED)  TROPONIN I (HIGH SENSITIVITY)  TROPONIN I (HIGH SENSITIVITY)     EKG  ED ECG REPORT I, Chesley Noon, the attending physician, personally viewed and interpreted this ECG.   Date: 12/19/2022  EKG Time: 13:55  Rate: 78  Rhythm: normal sinus rhythm  Axis: Normal  Intervals:none  ST&T Change: None  RADIOLOGY Chest x-ray reviewed and interpreted by me with no infiltrate, edema, or effusion.  PROCEDURES:  Critical Care performed: Yes, see critical care procedure  note(s)  .Critical Care  Performed by: Chesley Noon, MD Authorized by: Chesley Noon, MD   Critical care provider statement:    Critical care time (minutes):  30   Critical care time was exclusive of:  Separately billable procedures and treating other patients and teaching time   Critical care was necessary to treat or prevent imminent or life-threatening deterioration of the following conditions:  Sepsis and shock   Critical care was time spent personally by me on the following activities:  Development of treatment plan with patient or surrogate, discussions with consultants, evaluation of patient's response to treatment, examination of patient, ordering and review of laboratory studies, ordering and review of radiographic studies, ordering and performing treatments and interventions, pulse oximetry, re-evaluation of patient's condition and review of old charts   I assumed direction of critical care for this patient from another provider in my specialty: no   SUPRAPUBIC TUBE PLACEMENT  Date/Time: 12/19/2022 3:40 PM  Performed by: Chesley Noon, MD Authorized by: Chesley Noon, MD   Consent:    Consent obtained:  Emergent situation Universal protocol:    Patient identity confirmed:  Verbally with patient and arm band Anesthesia:    Anesthesia method:  Topical application   Topical anesthetic:  Lidocaine gel Procedure details:    Complexity:  Simple   Catheter type:  Foley   Catheter size:  18 Fr   Ultrasound guidance: no     Number of attempts:  1   Urine characteristics:  Cloudy and yellow Post-procedure details:    Procedure completion:  Tolerated well, no immediate complications    MEDICATIONS ORDERED IN ED: Medications  vancomycin (VANCOREADY) IVPB 2000 mg/400 mL (2,000 mg Intravenous New Bag/Given 12/19/22 1435)  lactated ringers bolus 1,000 mL (0 mLs Intravenous Stopped 12/19/22 1516)    And  lactated ringers bolus 1,000 mL (0 mLs Intravenous Stopped 12/19/22  1532)    And  lactated ringers bolus 1,000 mL (0 mLs Intravenous Stopped 12/19/22 1532)  ceFEPIme (MAXIPIME) 2 g in sodium chloride 0.9 % 100 mL IVPB (0 g Intravenous Stopped 12/19/22 1456)  metroNIDAZOLE (FLAGYL) IVPB 500 mg (0 mg Intravenous Stopped 12/19/22 1540)  lidocaine (XYLOCAINE) 2 % jelly 1 Application (1 Application Other Given by Other 12/19/22 1500)     IMPRESSION / MDM / ASSESSMENT AND PLAN / ED COURSE  I reviewed the triage vital signs and the nursing notes.                              82 y.o. male with past medical history of hypertension,  hyperlipidemia, DVT/PE on Eliquis, COPD, CKD, diastolic CHF, and kidney stones who presents to the ED for fever and altered mental status since yesterday.  Patient's presentation is most consistent with acute presentation with potential threat to life or bodily function.  Differential diagnosis includes, but is not limited to, sepsis, UTI, infected kidney stone, pneumonia, cellulitis, septic arthritis, AKI, electrode abnormality, anemia.  Patient chronically ill-appearing but in no acute distress, vital signs remarkable for hypertension but otherwise patient is afebrile with normal heart rate.  He is not in any respiratory distress, maintaining oxygen saturations at 100% on room air.  With his fever reported earlier today and hypotension, presentation is concerning for sepsis.  Patient given 30 cc/kg IV fluid bolus and we will start on broad-spectrum antibiotics.  He complains of pain at his right knee but there are no signs of infection on exam and I doubt septic arthritis.  He does have skin breakdown in his sacral area but no signs of infection there either.  He does have very cloudy urine with suprapubic tenderness, unclear when his last change of his suprapubic catheter was performed.  Suprapubic catheter was exchanged by me, patient continues to put out cloudy yellow urine, will send urinalysis off of fresh catheter.  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  We will further assess with CT imaging given his abdominal pain and sepsis.  Patient does have significant AKI and CT will need to be performed without contrast.  No significant anemia, leukocytosis, or electrolyte abnormality noted, LFTs are unremarkable and lactic acid within normal limits.  Patient turned over to oncoming providers pending CT results and admission.    FINAL CLINICAL IMPRESSION(S) / ED DIAGNOSES   Final diagnoses:  Sepsis without acute organ dysfunction, due to unspecified organism (HCC)  Altered mental status, unspecified altered mental status type  Lower abdominal pain     Rx / DC Orders   ED Discharge Orders     None        Note:  This document was prepared using Dragon voice recognition software and may include unintentional dictation errors.   Chesley Noon, MD 12/19/22 [phone]

## 2022-12-19 NOTE — ED Triage Notes (Signed)
BIB [facility] from [facility] for AMS, fever, decreased LS, chunky urine in suprapubic cath, leaking colostomy, and bed sores. C/o r knee pain. Reported allergic reaction to topical zinc yesterday. Heal protectors in place. Alert and oriented x2 with some confusion. No respiratory distress. 98% on RA.  Initially hypotensive 93/51, improved after NS 500cc IVF. BS 141. Fever 101 earlier, down to 98.1 after Tylenol given at 1030. Alert, NAD, calm, interactive.

## 2022-12-19 NOTE — Assessment & Plan Note (Signed)
Noted pyelonephritis and cystitis on imaging in the setting of chronic indwelling Foley status post catheter exchange in the ER Dr. Alvester Morin with urology evaluated patient Per Dr. Alvester Morin, imaging relatively unchanged from prior Status post IV cefepime, vancomycin and Flagyl in the ER for broad-spectrum coverage with concern for sepsis parameters prior to evaluation in the ER (VS WNL while in ER) IV Rocephin for urinary coverage Urine culture Per Dr. Alvester Morin, if symptoms fail to improve consider nephrostomy tubes Follow-up formal urology recommendations

## 2022-12-19 NOTE — ED Notes (Signed)
Suprapubic catheter exchanged by EDP Dr. Larinda Buttery, w/o difficulty or resistance, 30cc balloon

## 2022-12-19 NOTE — H&P (Signed)
History and Physical    Patient: [name] UJW:[id number] DOB: 08-15-1940 DOA: 12/19/2022 DOS: the patient was seen and examined on 12/19/2022 PCP: Rosetta Posner, MD  Patient coming from: SNF  Chief Complaint:  Chief Complaint  Patient presents with   Altered Mental Status   Fever   HPI: [name] is a 82 y.o. male with medical history significant of Multiple medical issues include hypertension, hyperlipidemia, GERD, COPD, diastolic CHF, seizure disorder, BPH with chronic indwelling Foley, history of PE on EliquisMultiple medical issues include hypertension, hyperlipidemia, GERD, COPD, diastolic CHF, seizure disorder, BPH with chronic indwelling Foley, history of PE on Eliquis presenting with pyelonephritis/cystitis, acute on chronic stage III CKD.  Limited history in the setting of baseline dementia.  Per report, patient with worsening abdominal pain.  Positive generalized confusion.  Was reported to have a Tmax of 101.5 at facility with the patient being given Tylenol prior to arrival.  Patient denies any chest pain, shortness of breath.  Mild nausea.  Worsening abdominal pain as well as painful urine flow.  Noted baseline chronic indwelling Foley as well as ostomy in place.  No reported change in ostomy output.  No shortness of breath.  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 Presented to the ER afebrile, hemodynamically stable.  Initial blood pressures 80s over 40s-improved to 100s over 80s status post 2 to 3 L fluid bolus.  White count 4.5, hemoglobin 10.1, urinalysis indicative of infection.  Creatinine 2.62, COVID flu and RSV negative.  Chest x-ray stable.  Knee plain films with nonacute medial femoral condyle fracture.  CT abdomen pelvis with unchanged mild left hydronephrosis and hydroureter with new mild adjacent fat stranding as well as bladder wall thickening concerning for cystitis.  Noted  persistent proctitis and improved prior consolidation from 09/28/2022 imaging. Review of Systems: As mentioned in the history of present illness. All other systems reviewed and are negative. Past Medical History:  Diagnosis Date   Bipolar 1 disorder (HCC)    C. difficile diarrhea 05/2016   history of ...   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   Schizophrenia (HCC)    Seizure (HCC)    on phenobarb   Urinary retention    frequently requiring foley placement   VRE (vancomycin resistant enterococcus) culture positive    urine   Past Surgical History:  Procedure Laterality Date   COLECTOMY WITH COLOSTOMY CREATION/HARTMANN PROCEDURE N/A 05/12/2022   Procedure: PARTIAL COLECTOMY WITH COLOSTOMY CREATION/HARTMANN PROCEDURE;  Surgeon: Carolan Shiver, MD;  Location: [facility];  Service: General;  Laterality: N/A;   COLONOSCOPY WITH PROPOFOL N/A 05/09/2022   Procedure: COLONOSCOPY WITH PROPOFOL;  Surgeon: Jaynie Collins, DO;  Location: [facility];  Service: Gastroenterology;  Laterality: N/A;   EXTRACORPOREAL SHOCK WAVE LITHOTRIPSY Right 05/05/2017   Procedure: EXTRACORPOREAL SHOCK WAVE LITHOTRIPSY (ESWL);  Surgeon: Vanna Scotland, MD;  Location: [facility];  Service: Urology;  Laterality: Right;   IR CATHETER TUBE CHANGE  06/23/2017   IR CATHETER TUBE CHANGE  06/28/2017   IVC  FILTER INSERTION N/A 05/11/2022   Procedure: IVC FILTER INSERTION;  Surgeon: Renford Dills, MD;  Location: [facility];  Service: Cardiovascular;  Laterality: N/A;   LAPAROTOMY N/A 05/12/2022   Procedure: EXPLORATORY LAPAROTOMY;  Surgeon: Carolan Shiver, MD;  Location: [facility];  Service: General;  Laterality: N/A;   Social History:  reports that he has never smoked. He has never been exposed to tobacco smoke. He has never used smokeless tobacco. He reports that he does not drink alcohol and does not use drugs.  Allergies  Allergen Reactions   Penicillins Other (See Comments)    Unknown reaction  Convulsions    Patient tolerated meropenem for several days at his facility prior to hospital arrival on 11/01/22   Penicillin G Rash and Other (See Comments)    Convulsions.    Has patient had a PCN reaction causing immediate rash, facial/tongue/throat swelling, SOB or lightheadedness with hypotension: not sure Has patient had a PCN reaction causing severe rash involving mucus membranes or skin necrosis: not sure Has patient had a PCN reaction that required hospitalization: not sure Has patient had a PCN reaction occurring within the last 10 years: not sure If all of the above answers are "NO", then may proceed with Cephalosporin use.    Family History  Problem Relation Age of Onset   Cancer Mother    Prostate cancer Neg Hx    Kidney disease Neg Hx    Kidney cancer Neg Hx    Bladder Cancer Neg Hx     Prior to Admission medications   Medication Sig Start Date End Date Taking? Authorizing Provider  albuterol (VENTOLIN HFA) 108 (90 Base) MCG/ACT inhaler Inhale 2 puffs into the lungs every 6 (six) hours as needed for wheezing or shortness of breath.   Yes [provider]  ammonium lactate (AMLACTIN) 12 % cream Apply 1 Application topically every 12 (twelve) hours.   Yes [provider]  ARIPiprazole (ABILIFY) 20 MG tablet Take 20 mg by mouth in the morning.   Yes [provider]  ascorbic acid (VITAMIN C) 500 MG tablet Take 1 tablet (500 mg total) by mouth 2 (two) times daily. 01/16/22  Yes Lynn Ito, MD  buPROPion (BUDEPRION XL) 300 MG 24 hr tablet Take 1 tablet (300 mg total) by mouth daily. 04/26/16  Yes  Wieting, Richard, MD  carvedilol (COREG) 25 MG tablet Take 25 mg by mouth 2 (two) times daily. 04/27/22  Yes [provider]  cetirizine (ZYRTEC) 10 MG tablet Take 10 mg by mouth in the morning.   Yes [provider]  cholecalciferol (VITAMIN D3) 25 MCG (1000 UNIT) tablet Take 1,000 Units by mouth in the morning.   Yes [provider]  Cranberry 250 MG CAPS Take 250 mg by mouth 2 (two) times daily.   Yes [provider]  ELIQUIS 2.5 MG TABS tablet Take 2.5 mg by mouth 2 (two) times daily.   Yes [provider]  ferrous sulfate 325 (65 FE) MG tablet Take 325 mg by mouth daily with breakfast.   Yes [provider]  finasteride (PROSCAR) 5 MG tablet Take 1 tablet (5 mg total) by mouth daily. 04/27/16  Yes Wieting, Richard, MD  fluticasone furoate-vilanterol (BREO ELLIPTA) 100-25 MCG/ACT AEPB Inhale 1 puff into the lungs daily. 05/18/22  Yes Esaw Grandchild A, DO  furosemide (LASIX) 20 MG tablet Take 1 tablet (20 mg total) by mouth 2 (two) times daily. 05/17/22  Yes Pennie Banter, DO  guaiFENesin (ROBITUSSIN) 100 MG/5ML liquid Take 5 mLs by mouth every 4 (four) hours as needed for cough or to loosen phlegm. 05/17/22  Yes Esaw Grandchild A, DO  HYDROcodone-acetaminophen (NORCO/VICODIN) 5-325 MG tablet Take 1-2 tablets by mouth every 4 (four) hours as needed for moderate pain. 05/17/22  Yes Esaw Grandchild A, DO  lactulose (CHRONULAC) 10 GM/15ML solution Take 15 mLs (10 g total) by mouth every 6 (six) hours as needed for mild constipation or moderate constipation. 05/17/22  Yes Esaw Grandchild A, DO  Multiple Vitamin (MULTI-VITAMINS) TABS Take 1 tablet by mouth daily.   Yes [provider]  nystatin (MYCOSTATIN/NYSTOP) powder Apply 1 Application topically 2 (two) times daily.   Yes [provider]  PHENobarbital (LUMINAL) 64.8 MG tablet Take 64.8 mg by mouth daily.   Yes [provider]  polyethylene glycol (MIRALAX / GLYCOLAX)  17 g packet Take 17 g by mouth daily as needed. 05/17/22  Yes Esaw Grandchild A, DO  potassium chloride SA (KLOR-CON M) 20 MEQ tablet Take 1 tablet (20 mEq total) by mouth 2 (two) times daily. 05/17/22  Yes Esaw Grandchild A, DO  pravastatin (PRAVACHOL) 10 MG tablet Take 10 mg by mouth at bedtime. 04/28/22  Yes [provider]  Suvorexant 5 MG TABS Take 5 mg by mouth at bedtime.   Yes [provider]  umeclidinium bromide (INCRUSE ELLIPTA) 62.5 MCG/ACT AEPB Inhale 1 puff into the lungs daily. 05/18/22  Yes Esaw Grandchild A, DO  zinc oxide 20 % ointment Apply 1 application. topically 2 (two) times daily. (Apply to legs)   Yes [provider]  Dextromethorphan HBr 15 MG CAPS Take 2 capsules by mouth every 12 (twelve) hours. Patient not taking: Reported on 12/19/2022    [provider]  folic acid (FOLVITE) 1 MG tablet Take 1 mg by mouth in the morning. Patient not taking: Reported on 12/19/2022    [provider]    Physical Exam: Vitals:   12/19/22 1630 12/19/22 1700 12/19/22 1730 12/19/22 1745  BP: (!) 107/54 110/86 92/77   Pulse: 87 83 84   Resp: 20 (!) 23 16   Temp:    98.6 F (37 C)  TempSrc:    Rectal  SpO2: 100% 100% 100%   Weight:       Physical Exam Constitutional:      Appearance: He is obese.     Comments: Positive mild generalized confusion  HENT:     Head: Normocephalic and atraumatic.     Nose: Nose normal.     Mouth/Throat:     Mouth: Mucous membranes are dry.  Eyes:     Pupils: Pupils are equal, round, and reactive to light.  Cardiovascular:     Rate and Rhythm: Normal rate and regular rhythm.  Pulmonary:     Effort: Pulmonary effort is normal.  Abdominal:     General: Bowel sounds are normal.     Comments: Ostomy clean dry and intact   Musculoskeletal:     Comments: Positive generalized weakness  Neurological:     Comments: Positive generalized confusion Otherwise nonfocal neuroexam  Psychiatric:        Mood and  Affect: Mood normal.     Data Reviewed:  There are no new results to review at this time. CT ABDOMEN PELVIS WO CONTRAST CLINICAL DATA:  Abdominal pain. Patient was brought in for altered mental status, fever, chunky urine in suprapubic catheter, leaking colostomy, and bedsores.  EXAM: CT ABDOMEN AND PELVIS WITHOUT  CONTRAST  TECHNIQUE: Multidetector CT imaging of the abdomen and pelvis was performed following the standard protocol without IV contrast.  RADIATION DOSE REDUCTION: This exam was performed according to the departmental dose-optimization program which includes automated exposure control, adjustment of the mA and/or kV according to patient size and/or use of iterative reconstruction technique.  COMPARISON:  CT abdomen pelvis 09/12/2022, 05/08/2022  FINDINGS: Of note, patient respiratory motion degrades image quality.  Lower chest: Near-complete resolution of the peribronchovascular consolidation seen in the right lung base on 09/12/2022 with a few small residual nodular consolidations on today's exam. Visualized left lung is clear. Coronary artery calcification. Mitral and aortic valve calcification.  Hepatobiliary: No focal liver abnormality is seen. No gallstones, gallbladder wall thickening, or biliary dilatation.  Pancreas: Unremarkable. No pancreatic ductal dilatation or surrounding inflammatory changes.  Spleen: Normal in size without focal abnormality.  Adrenals/Urinary Tract: Adrenal glands are unremarkable. Atrophic kidneys. 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 Stone burden is unchanged compared to prior exams. No right hydronephrosis. Persistent mild left hydronephrosis and hydroureter with mild adjacent fat stranding, particularly at the level of the proximal ureter and renal pelvis. A small amount of air is seen within the left renal collecting system, likely related to instrumentation/suprapubic  catheter.  Suprapubic catheter with the tip at the left UVJ. Small amount of non dependent air is noted within the urinary bladder. Diffuse bladder wall thickening is again noted, though is improved compared to 09/12/2022. Similarly, adjacent fat stranding seen on prior exam has also markedly improved.  Stomach/Bowel: Stomach is within normal limits. Appendix appears normal. 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 There is persistent diffuse wall thickening throughout the rectum and perirectal fat stranding.  Vascular/Lymphatic: Aortic atherosclerosis. No enlarged abdominal or pelvic lymph nodes.  Reproductive: Prostate is unremarkable. Calcifications noted within the vas deferens bilaterally.  Other: Diffuse anasarca.  No abdominopelvic ascites.  Musculoskeletal: No acute or suspicious osseous findings. Multilevel degenerative disc disease throughout the visualized distal thoracic and lumbar spine.  IMPRESSION: 1. Unchanged mild left hydronephrosis and hydroureter with new mild adjacent fat stranding. Findings are concerning for possible ascending urinary tract infection. Recommend correlation with urinalysis. 2. Suprapubic catheter with the tip at the left UVJ. Small amount of air is seen within the urinary bladder and left renal collecting system, likely related to instrumentation/suprapubic catheter. There is persistent diffuse bladder wall thickening, though improved compared to 09/12/2022. 3. 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 4. Near-complete resolution of the peribronchovascular consolidation seen in the right lung base on 09/12/2022. 5. Diffuse anasarca. 6. Aortic atherosclerosis.  Aortic Atherosclerosis (ICD10-I70.0).  Electronically Signed   By: Sherron Ales M.D.   On: 12/19/2022 16:38 DG Knee 2 Views Right CLINICAL DATA:  Altered mental status.  Pain and fever.  EXAM: RIGHT KNEE - 1-2  VIEW  COMPARISON:  Tib fib study from 11/18/2016  FINDINGS: Fracture involving the nonweightbearing medial cortex of the medial femoral condyle is new in the interval but appears nonacute with adjacent bony callus evident. Similar cortical irregularity along the lateral nonweightbearing cortex of the lateral femoral condyle. Subtle lucency along the growth plate of the proximal tibial metaphysis is stable. No evidence for joint effusion. Bones are demineralized.  IMPRESSION: Fracture involving the nonweightbearing medial cortex of the medial femoral condyle is new in the interval but appears nonacute with adjacent bony callus evident.  Diffuse bony demineralization without acute fracture or joint effusion.  Electronically Signed   By:  Kennith Center M.D.   On: 12/19/2022 15:36 DG Chest Port 1 View CLINICAL DATA:  Questionable sepsis. Acute mental status change. Fever.  EXAM: PORTABLE CHEST 1 VIEW  COMPARISON:  Chest x-ray May 06, 2022  FINDINGS: The heart size and mediastinal contours are within normal limits. Both lungs are clear. The visualized skeletal structures are unremarkable.  IMPRESSION: No active disease.  Electronically Signed   By: Gerome Sam III M.D.   On: 12/19/2022 15:34  Lab Results  Component Value Date   WBC 4.5 12/19/2022   HGB 10.1 (L) 12/19/2022   HCT 33.4 (L) 12/19/2022   MCV 94.1 12/19/2022   PLT 158 12/19/2022   Last metabolic panel Lab Results  Component Value Date   GLUCOSE 91 12/19/2022   NA 139 12/19/2022   K 4.6 12/19/2022   CL 109 12/19/2022   CO2 22 12/19/2022   BUN 41 (H) 12/19/2022   CREATININE 2.62 (H) 12/19/2022   GFRNONAA 24 (L) 12/19/2022   CALCIUM 8.1 (L) 12/19/2022   PHOS 3.3 05/15/2022   PROT 5.7 (L) 12/19/2022   ALBUMIN 2.4 (L) 12/19/2022   BILITOT 0.5 12/19/2022   ALKPHOS 65 12/19/2022   AST 12 (L) 12/19/2022   ALT 10 12/19/2022   ANIONGAP 8 12/19/2022    Assessment and Plan: *  Pyelonephritis Noted pyelonephritis and cystitis on imaging in the setting of chronic indwelling Foley status post catheter exchange in the ER Dr. Alvester Morin with urology evaluated patient Per Dr. Alvester Morin, imaging relatively unchanged from prior Status post IV cefepime, vancomycin and Flagyl in the ER for broad-spectrum coverage with concern for sepsis parameters prior to evaluation in the ER (VS WNL while in ER) IV Rocephin for urinary coverage Urine culture Per Dr. Alvester Morin, if symptoms fail to improve consider nephrostomy tubes Follow-up formal urology recommendations  Acute renal failure superimposed on stage 3 chronic kidney disease (HCC) Creatinine 2.6 on presentation with baseline creatinine of 1-1.4  Suspect obstructive component in the setting of hydrouteronephrosis Status post Foley exchange in the ER Does appear mildly intravascularly dry Monitor renal function with hydration Check FENa Renal consult as clinically indicated Consider nephrostomy tubes if decreased renal function persists.   Chronic diastolic CHF (congestive heart failure) (HCC) 2D echo September 2017 with EF 50 to 55% Appears intravascularly dry though with dependent edema Chest x-ray stable Status post fluid bolus in the ER Monitor volume status closely Follow  COPD (chronic obstructive pulmonary disease) (HCC) Stable from respiratory standpoint Continue home inhalers.  Seizure disorder (HCC) Cont phenobarbital  Acute deep vein thrombosis (DVT) of left lower extremity (HCC) Noted to be on Eliquis Will hold in the setting of possible nephrostomy tube placement Follow-up  Gastroesophageal reflux disease PPI  Benign essential hypertension Lower limit of normal BP on presentation Status post IV fluid hydration Monitor hold BP medicines for now      Advance Care Planning:   Code Status: Full Code   Consults: Urology w/ Dr. Alvester Morin   Family Communication: No family at the bedside   Severity of  Illness: The appropriate patient status for this patient is INPATIENT. Inpatient status is judged to be reasonable and necessary in order to provide the required intensity of service to ensure the patient's safety. The patient's presenting symptoms, physical exam findings, and initial radiographic and laboratory data in the context of their chronic comorbidities is felt to place them at high risk for further clinical deterioration. Furthermore, it is not anticipated that the patient will be medically stable  for discharge from the hospital within 2 midnights of admission.   * I certify that at the point of admission it is my clinical judgment that the patient will require inpatient hospital care spanning beyond 2 midnights from the point of admission due to high intensity of service, high risk for further deterioration and high frequency of surveillance required.*  Author: Floydene Flock, MD 12/19/2022 6:11 PM  For on call review [email or web address].

## 2022-12-19 NOTE — ED Notes (Signed)
Hospitalist Dr. Alvester Morin and Urologist Dr. Alvester Morin at [facility]. Suprapubic cath repositioned by Dr. Alvester Morin. Sacral pad applied to stage II & III sacral wounds.

## 2022-12-19 NOTE — Assessment & Plan Note (Signed)
PPI ?

## 2022-12-19 NOTE — Progress Notes (Signed)
PHARMACY -  BRIEF ANTIBIOTIC NOTE   Pharmacy has received consult(s) for vancomycin and cefepime from an ED provider.  The patient's profile has been reviewed for ht/wt/allergies/indication/available labs.    One time order(s) placed for Vancomycin 2000mg  and Cefepime 2g  Further antibiotics/pharmacy consults should be ordered by admitting physician if indicated.                       Thank you, Gardner Candle, PharmD, BCPS Clinical Pharmacist 12/19/2022 2:09 PM

## 2022-12-19 NOTE — ED Notes (Signed)
Xray at [facility] 

## 2022-12-19 NOTE — Assessment & Plan Note (Signed)
2D echo September 2017 with EF 50 to 55% Appears intravascularly dry though with dependent edema Chest x-ray stable Status post fluid bolus in the ER Monitor volume status closely Follow

## 2022-12-19 NOTE — Assessment & Plan Note (Signed)
Lower limit of normal BP on presentation Status post IV fluid hydration Monitor hold BP medicines for now

## 2022-12-19 NOTE — Consult Note (Signed)
H&P Physician requesting consult: Carollee Herter Mumma  Chief Complaint: Bilateral renal calculi, concern for sepsis  History of Present Illness: 82 year old male with multiple medical comorbidities including COPD, diastolic heart failure, DVT/PE on Eliquis, seizures, CKD, bipolar disorder and chronic bilateral renal calculi presented to the emergency department with worsening confusion over the past day and cloudy output from his Foley catheter.  Also reportedly had fevers.  Has been afebrile here but presented with hypotension that has improved with fluid resuscitation.  Repeat CT scan was performed that revealed stable bilateral staghorn calculi.  He had stable mild left hydronephrosis.  Suprapubic tube tip was at the left UVJ.  Suprapubic tube has since been exchanged.  Patient states that he came in because of some abdominal pain that has been improving since catheter exchange.  Upon my assessment, vital signs are stable.  He was previously scheduled to undergo staged PCNL but unfortunately the procedure was aborted due to the inability to obtain consent secondary to patient confusion.  Past Medical History:  Diagnosis Date   Bipolar 1 disorder (HCC)    C. difficile diarrhea 05/2016   history of ...   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   Schizophrenia (HCC)    Seizure (HCC)    on phenobarb   Urinary retention    frequently requiring foley placement   VRE (vancomycin resistant enterococcus) culture positive    urine   Past Surgical History:  Procedure Laterality Date   COLECTOMY WITH COLOSTOMY CREATION/HARTMANN PROCEDURE N/A 05/12/2022    Procedure: PARTIAL COLECTOMY WITH COLOSTOMY CREATION/HARTMANN PROCEDURE;  Surgeon: Carolan Shiver, MD;  Location: [facility] ORS;  Service: General;  Laterality: N/A;   COLONOSCOPY WITH PROPOFOL N/A 05/09/2022   Procedure: COLONOSCOPY WITH PROPOFOL;  Surgeon: Jaynie Collins, DO;  Location: [facility] ENDOSCOPY;  Service: Gastroenterology;  Laterality: N/A;   EXTRACORPOREAL SHOCK WAVE LITHOTRIPSY Right 05/05/2017   Procedure: EXTRACORPOREAL SHOCK WAVE LITHOTRIPSY (ESWL);  Surgeon: Vanna Scotland, MD;  Location: [facility] ORS;  Service: Urology;  Laterality: Right;   IR CATHETER TUBE CHANGE  06/23/2017   IR CATHETER TUBE CHANGE  06/28/2017   IVC FILTER INSERTION N/A 05/11/2022   Procedure: IVC FILTER INSERTION;  Surgeon: Renford Dills, MD;  Location: [facility] INVASIVE CV LAB;  Service: Cardiovascular;  Laterality: N/A;   LAPAROTOMY N/A 05/12/2022   Procedure: EXPLORATORY LAPAROTOMY;  Surgeon: Carolan Shiver, MD;  Location: [facility] ORS;  Service: General;  Laterality: N/A;    Home Medications:  (Not in a hospital admission)  Allergies:  Allergies  Allergen Reactions   Penicillin G Rash and Other (See Comments)    Convulsions.    Has patient had a PCN reaction causing immediate rash, facial/tongue/throat swelling, SOB or lightheadedness with hypotension: not sure Has patient had a PCN reaction causing severe rash involving mucus membranes or skin necrosis: not sure Has patient had a PCN reaction that required hospitalization: not sure Has patient had a PCN reaction occurring within the last 10 years: not sure If all of the above answers are "NO", then may proceed with Cephalosporin use.    Family History  Problem  Relation Age of Onset   Cancer Mother    Prostate cancer Neg Hx    Kidney disease Neg Hx    Kidney cancer Neg Hx    Bladder Cancer Neg Hx    Social History:  reports that he has never smoked. He has never been exposed to tobacco smoke. He has never used smokeless tobacco. He  reports that he does not drink alcohol and does not use drugs.  ROS: A complete review of systems was performed.  All systems are negative except for pertinent findings as noted. ROS   Physical Exam:  Vital signs in last 24 hours: Temp:  [98 F (36.7 C)] 98 F (36.7 C) (05/12 1446) Pulse Rate:  [74-90] 87 (05/12 1630) Resp:  [11-21] 20 (05/12 1630) BP: (87-115)/(47-94) 107/54 (05/12 1630) SpO2:  [91 %-100 %] 100 % (05/12 1630) Weight:  [101.5 kg] 101.5 kg (05/12 1408) General:  Alert and oriented, No acute distress Genitourinary: Patient suprapubic tube is in place draining slightly cloudy urine  Laboratory Data:  Results for orders placed or performed during the hospital encounter of 12/19/22 (from the past 24 hour(s))  Resp panel by RT-PCR (RSV, Flu A&B, Covid) Anterior Nasal Swab     Status: None   Collection Time: 12/19/22  2:20 PM   Specimen: Anterior Nasal Swab  Result Value Ref Range   SARS Coronavirus 2 by RT PCR NEGATIVE NEGATIVE   Influenza A by PCR NEGATIVE NEGATIVE   Influenza B by PCR NEGATIVE NEGATIVE   Resp Syncytial Virus by PCR NEGATIVE NEGATIVE  Lactic acid, plasma     Status: None   Collection Time: 12/19/22  2:20 PM  Result Value Ref Range   Lactic Acid, Venous 1.7 0.5 - 1.9 mmol/L  Comprehensive metabolic panel     Status: Abnormal   Collection Time: 12/19/22  2:22 PM  Result Value Ref Range   Sodium 139 135 - 145 mmol/L   Potassium 4.6 3.5 - 5.1 mmol/L   Chloride 109 98 - 111 mmol/L   CO2 22 22 - 32 mmol/L   Glucose, Bld 91 70 - 99 mg/dL   BUN 41 (H) 8 - 23 mg/dL   Creatinine, Ser 3.08 (H) 0.61 - 1.24 mg/dL   Calcium 8.1 (L) 8.9 - 10.3 mg/dL   Total Protein 5.7 (L) 6.5 - 8.1 g/dL   Albumin 2.4 (L) 3.5 - 5.0 g/dL   AST 12 (L) 15 - 41 U/L   ALT 10 0 - 44 U/L   Alkaline Phosphatase 65 38 - 126 U/L   Total Bilirubin 0.5 0.3 - 1.2 mg/dL   GFR, Estimated 24 (L) >60 mL/min   Anion gap 8 5 - 15  CBC with Differential     Status: Abnormal    Collection Time: 12/19/22  2:22 PM  Result Value Ref Range   WBC 4.5 4.0 - 10.5 K/uL   RBC 3.55 (L) 4.22 - 5.81 MIL/uL   Hemoglobin 10.1 (L) 13.0 - 17.0 g/dL   HCT 65.7 (L) 84.6 - 96.2 %   MCV 94.1 80.0 - 100.0 fL   MCH 28.5 26.0 - 34.0 pg   MCHC 30.2 30.0 - 36.0 g/dL   RDW 95.2 (H) 84.1 - 32.4 %   Platelets 158 150 - 400 K/uL   nRBC 0.0 0.0 - 0.2 %   Neutrophils Relative % 69 %   Neutro Abs 3.0 1.7 - 7.7 K/uL   Lymphocytes Relative 21 %   Lymphs Abs 1.0 0.7 - 4.0  K/uL   Monocytes Relative 4 %   Monocytes Absolute 0.2 0.1 - 1.0 K/uL   Eosinophils Relative 6 %   Eosinophils Absolute 0.3 0.0 - 0.5 K/uL   Basophils Relative 0 %   Basophils Absolute 0.0 0.0 - 0.1 K/uL   Immature Granulocytes 0 %   Abs Immature Granulocytes 0.02 0.00 - 0.07 K/uL  Protime-INR     Status: Abnormal   Collection Time: 12/19/22  2:22 PM  Result Value Ref Range   Prothrombin Time 19.1 (H) 11.4 - 15.2 seconds   INR 1.6 (H) 0.8 - 1.2  APTT     Status: None   Collection Time: 12/19/22  2:22 PM  Result Value Ref Range   aPTT 36 24 - 36 seconds  Troponin I (High Sensitivity)     Status: None   Collection Time: 12/19/22  2:22 PM  Result Value Ref Range   Troponin I (High Sensitivity) 9 <18 ng/L  Urinalysis, w/ Reflex to Culture (Infection Suspected) -Urine, Clean Catch     Status: Abnormal   Collection Time: 12/19/22  4:15 PM  Result Value Ref Range   Specimen Source URINE, CLEAN CATCH    Color, Urine AMBER (A) YELLOW   APPearance CLOUDY (A) CLEAR   Specific Gravity, Urine 1.015 1.005 - 1.030   pH 6.0 5.0 - 8.0   Glucose, UA NEGATIVE NEGATIVE mg/dL   Hgb urine dipstick LARGE (A) NEGATIVE   Bilirubin Urine NEGATIVE NEGATIVE   Ketones, ur NEGATIVE NEGATIVE mg/dL   Protein, ur 161 (A) NEGATIVE mg/dL   Nitrite POSITIVE (A) NEGATIVE   Leukocytes,Ua LARGE (A) NEGATIVE   RBC / HPF >50 0 - 5 RBC/hpf   WBC, UA >50 0 - 5 WBC/hpf   Bacteria, UA MANY (A) NONE SEEN   Squamous Epithelial / HPF 0-5 0 - 5 /HPF    WBC Clumps PRESENT    Mucus PRESENT    Hyaline Casts, UA PRESENT    Recent Results (from the past 240 hour(s))  Resp panel by RT-PCR (RSV, Flu A&B, Covid) Anterior Nasal Swab     Status: None   Collection Time: 12/19/22  2:20 PM   Specimen: Anterior Nasal Swab  Result Value Ref Range Status   SARS Coronavirus 2 by RT PCR NEGATIVE NEGATIVE Final    Comment: (NOTE) SARS-CoV-2 target nucleic acids are NOT DETECTED.  The SARS-CoV-2 RNA is generally detectable in upper respiratory specimens during the acute phase of infection. The lowest concentration of SARS-CoV-2 viral copies this assay can detect is 138 copies/mL. A negative result does not preclude SARS-Cov-2 infection and should not be used as the sole basis for treatment or other patient management decisions. A negative result may occur with  improper specimen collection/handling, submission of specimen other than nasopharyngeal swab, presence of viral mutation(s) within the areas targeted by this assay, and inadequate number of viral copies(<138 copies/mL). A negative result must be combined with clinical observations, patient history, and epidemiological information. The expected result is Negative.  Fact Sheet for Patients:  BloggerCourse.com  Fact Sheet for Healthcare Providers:  SeriousBroker.it  This test is no t yet approved or cleared by the [place] FDA and  has been authorized for detection and/or diagnosis of SARS-CoV-2 by FDA under an Emergency Use Authorization (EUA). This EUA will remain  in effect (meaning this test can be used) for the duration of the COVID-19 declaration under Section 564(b)(1) of the Act, 21 U.S.C.section 360bbb-3(b)(1), unless the authorization is terminated  or revoked  sooner.       Influenza A by PCR NEGATIVE NEGATIVE Final   Influenza B by PCR NEGATIVE NEGATIVE Final    Comment: (NOTE) The Xpert Xpress SARS-CoV-2/FLU/RSV plus  assay is intended as an aid in the diagnosis of influenza from Nasopharyngeal swab specimens and should not be used as a sole basis for treatment. Nasal washings and aspirates are unacceptable for Xpert Xpress SARS-CoV-2/FLU/RSV testing.  Fact Sheet for Patients: BloggerCourse.com  Fact Sheet for Healthcare Providers: SeriousBroker.it  This test is not yet approved or cleared by the [place] FDA and has been authorized for detection and/or diagnosis of SARS-CoV-2 by FDA under an Emergency Use Authorization (EUA). This EUA will remain in effect (meaning this test can be used) for the duration of the COVID-19 declaration under Section 564(b)(1) of the Act, 21 U.S.C. section 360bbb-3(b)(1), unless the authorization is terminated or revoked.     Resp Syncytial Virus by PCR NEGATIVE NEGATIVE Final    Comment: (NOTE) Fact Sheet for Patients: BloggerCourse.com  Fact Sheet for Healthcare Providers: SeriousBroker.it  This test is not yet approved or cleared by the [place] FDA and has been authorized for detection and/or diagnosis of SARS-CoV-2 by FDA under an Emergency Use Authorization (EUA). This EUA will remain in effect (meaning this test can be used) for the duration of the COVID-19 declaration under Section 564(b)(1) of the Act, 21 U.S.C. section 360bbb-3(b)(1), unless the authorization is terminated or revoked.  Performed at [facility], [address] [place], [place] [address]    Creatinine: Recent Labs    12/19/22 1422  CREATININE 2.62*    Impression/Assessment:  Chronic bilateral renal staghorn calculi Left hydronephrosis Sepsis secondary to UTI  Plan:  Patient's hydronephrosis is mild and chronic.  Vital signs improving with fluid resuscitation and antibiotics alone.  Recommend continued fluids and antibiotics.  Follow-up cultures.  If he  were to clinically decline, would recommend nephrostomy tube.  Would hold Eliquis in the event that he needs a nephrostomy tube.  Risk of placement of nephrostomy tube on Eliquis outweighs benefit at this time.  Patient is clinically stable and improving.  Ray Church, III 12/19/2022, 5:18 PM

## 2022-12-19 NOTE — ED Notes (Signed)
Pt remains alert, NAD, calm, interactive, VSS, to CT.

## 2022-12-19 NOTE — ED Notes (Signed)
EDP at [facility]. Stoma leaking liquid stool. IVF hung/ infusing w/o.

## 2022-12-19 NOTE — ED Notes (Signed)
New colostomy bag applied/sealed.

## 2022-12-19 NOTE — Assessment & Plan Note (Signed)
Cont phenobarbital

## 2022-12-19 NOTE — Assessment & Plan Note (Signed)
Creatinine 2.6 on presentation with baseline creatinine of 1 Suspect obstructive component in the setting of hydrouteronephrosis Status post Foley exchange in the ER Does appear mildly dry Monitor renal function with hydration Check FENa Renal consult as clinically indicated

## 2022-12-19 NOTE — Assessment & Plan Note (Signed)
Noted to be on Eliquis Will hold in the setting of possible nephrostomy tube placement Follow-up

## 2022-12-20 ENCOUNTER — Encounter: Payer: Self-pay | Admitting: Family Medicine

## 2022-12-20 DIAGNOSIS — N179 Acute kidney failure, unspecified: Secondary | ICD-10-CM | POA: Diagnosis not present

## 2022-12-20 DIAGNOSIS — N1330 Unspecified hydronephrosis: Secondary | ICD-10-CM

## 2022-12-20 DIAGNOSIS — N12 Tubulo-interstitial nephritis, not specified as acute or chronic: Secondary | ICD-10-CM | POA: Diagnosis not present

## 2022-12-20 DIAGNOSIS — N200 Calculus of kidney: Secondary | ICD-10-CM | POA: Diagnosis not present

## 2022-12-20 DIAGNOSIS — G40909 Epilepsy, unspecified, not intractable, without status epilepticus: Secondary | ICD-10-CM | POA: Diagnosis not present

## 2022-12-20 LAB — CULTURE, BLOOD (ROUTINE X 2): Special Requests: ADEQUATE

## 2022-12-20 LAB — COMPREHENSIVE METABOLIC PANEL
ALT: 13 U/L (ref 0–44)
AST: 13 U/L — ABNORMAL LOW (ref 15–41)
Albumin: 2.3 g/dL — ABNORMAL LOW (ref 3.5–5.0)
Alkaline Phosphatase: 60 U/L (ref 38–126)
Anion gap: 7 (ref 5–15)
BUN: 42 mg/dL — ABNORMAL HIGH (ref 8–23)
CO2: 23 mmol/L (ref 22–32)
Calcium: 7.9 mg/dL — ABNORMAL LOW (ref 8.9–10.3)
Chloride: 109 mmol/L (ref 98–111)
Creatinine, Ser: 2.25 mg/dL — ABNORMAL HIGH (ref 0.61–1.24)
GFR, Estimated: 29 mL/min — ABNORMAL LOW (ref >60)
Glucose, Bld: 120 mg/dL — ABNORMAL HIGH (ref 70–99)
Potassium: 3.9 mmol/L (ref 3.5–5.1)
Sodium: 139 mmol/L (ref 135–145)
Total Bilirubin: 0.6 mg/dL (ref 0.3–1.2)
Total Protein: 5.5 g/dL — ABNORMAL LOW (ref 6.5–8.1)

## 2022-12-20 LAB — CBC
HCT: 28.9 % — ABNORMAL LOW (ref 39.0–52.0)
Hemoglobin: 9 g/dL — ABNORMAL LOW (ref 13.0–17.0)
MCH: 28.6 pg (ref 26.0–34.0)
MCHC: 31.1 g/dL (ref 30.0–36.0)
MCV: 91.7 fL (ref 80.0–100.0)
Platelets: 146 10*3/uL — ABNORMAL LOW (ref 150–400)
RBC: 3.15 MIL/uL — ABNORMAL LOW (ref 4.22–5.81)
RDW: 16.8 % — ABNORMAL HIGH (ref 11.5–15.5)
WBC: 4.4 10*3/uL (ref 4.0–10.5)
nRBC: 0 % (ref 0.0–0.2)

## 2022-12-20 MED ORDER — PHENOBARBITAL 32.4 MG PO TABS
64.8000 mg | ORAL_TABLET | Freq: Every day | ORAL | Status: DC
  Administered 2022-12-20 – 2022-12-23 (×4): 64.8 mg via ORAL
  Filled 2022-12-20 (×4): qty 2

## 2022-12-20 MED ORDER — FINASTERIDE 5 MG PO TABS
5.0000 mg | ORAL_TABLET | Freq: Every day | ORAL | Status: DC
  Administered 2022-12-20 – 2022-12-23 (×4): 5 mg via ORAL
  Filled 2022-12-20 (×4): qty 1

## 2022-12-20 MED ORDER — SODIUM CHLORIDE 0.9 % IV SOLN
2.0000 g | INTRAVENOUS | Status: DC
  Administered 2022-12-20: 2 g via INTRAVENOUS
  Filled 2022-12-20 (×2): qty 12.5

## 2022-12-20 MED ORDER — ARIPIPRAZOLE 10 MG PO TABS
20.0000 mg | ORAL_TABLET | Freq: Every day | ORAL | Status: DC
  Administered 2022-12-20 – 2022-12-23 (×4): 20 mg via ORAL
  Filled 2022-12-20 (×4): qty 2

## 2022-12-20 MED ORDER — UMECLIDINIUM BROMIDE 62.5 MCG/ACT IN AEPB
1.0000 | INHALATION_SPRAY | Freq: Every day | RESPIRATORY_TRACT | Status: DC
  Administered 2022-12-21 – 2022-12-23 (×3): 1 via RESPIRATORY_TRACT
  Filled 2022-12-20: qty 7

## 2022-12-20 MED ORDER — BUPROPION HCL ER (XL) 300 MG PO TB24
300.0000 mg | ORAL_TABLET | Freq: Every day | ORAL | Status: DC
  Administered 2022-12-20 – 2022-12-23 (×4): 300 mg via ORAL
  Filled 2022-12-20 (×2): qty 1
  Filled 2022-12-20: qty 2
  Filled 2022-12-20: qty 1

## 2022-12-20 MED ORDER — SUVOREXANT 5 MG PO TABS: 5.0000 mg | ORAL_TABLET | Freq: Every day | ORAL | Status: DC

## 2022-12-20 NOTE — ED Notes (Signed)
Lab called to draw bloodwork.

## 2022-12-20 NOTE — Assessment & Plan Note (Signed)
Noted pyelonephritis and cystitis on imaging in the setting of chronic indwelling suprapubic catheter status post catheter exchange in the ER Dr. Alvester Morin with urology evaluated patient Per Dr. Alvester Morin, imaging relatively unchanged from prior Status post IV cefepime, vancomycin and Flagyl in the ER for broad-spectrum coverage with concern for sepsis parameters prior to evaluation in the ER (VS WNL while in ER) IV Rocephin for urinary coverage Urine culture Per Dr. Alvester Morin, if symptoms fail to improve consider nephrostomy tubes Follow-up formal urology recommendations

## 2022-12-20 NOTE — Assessment & Plan Note (Signed)
Creatinine 2.6 on presentation with baseline creatinine of 1-1.4  Suspect obstructive component in the setting of hydrouteronephrosis Status post suprapubic exchange in the ER Does appear mildly intravascularly dry Monitor renal function with hydration Check FENa Renal consult as clinically indicated Consider nephrostomy tubes if decreased renal function persists.

## 2022-12-20 NOTE — Progress Notes (Addendum)
Urology Consult Follow Up  Subjective: Patient resting comfortably in bed.  VSS afebrile  UA from yesterday amber cloudy, specific gravity 1.015, pH 6.0, large hemoglobin, 100 protein, nitrite positive , large leukocytes, > 50 RBC's, >50 WBC's, many bacteria, 0-5 squames, WBC clumps present, mucus present and hyaline casts present.   Urine culture is pending.  AM labs still pending  SPT in place with good UOP - 1000 mL of cloudy yellow urine overnight   Anti-infectives: Anti-infectives (From admission, onward)    Start     Dose/Rate Route Frequency Ordered Stop   12/20/22 1400  ceFEPIme (MAXIPIME) 2 g in sodium chloride 0.9 % 100 mL IVPB        2 g 200 mL/hr over 30 Minutes Intravenous Every 24 hours 12/20/22 0844     12/19/22 1415  ceFEPIme (MAXIPIME) 2 g in sodium chloride 0.9 % 100 mL IVPB        2 g 200 mL/hr over 30 Minutes Intravenous  Once 12/19/22 1400 12/19/22 1456   12/19/22 1415  metroNIDAZOLE (FLAGYL) IVPB 500 mg        500 mg 100 mL/hr over 60 Minutes Intravenous  Once 12/19/22 1400 12/19/22 1540   12/19/22 1415  vancomycin (VANCOCIN) IVPB 1000 mg/200 mL premix  Status:  Discontinued        1,000 mg 200 mL/hr over 60 Minutes Intravenous  Once 12/19/22 1400 12/19/22 1409   12/19/22 1415  vancomycin (VANCOREADY) IVPB 2000 mg/400 mL        2,000 mg 200 mL/hr over 120 Minutes Intravenous  Once 12/19/22 1411 12/19/22 1646       Current Facility-Administered Medications  Medication Dose Route Frequency Provider Last Rate Last Admin   ceFEPIme (MAXIPIME) 2 g in sodium chloride 0.9 % 100 mL IVPB  2 g Intravenous Q24H Lurene Shadow, MD       enoxaparin (LOVENOX) injection 40 mg  40 mg Subcutaneous Q24H Floydene Flock, MD       ondansetron [facility]) tablet 4 mg  4 mg Oral Q6H PRN Floydene Flock, MD       Or   ondansetron [facility]) injection 4 mg  4 mg Intravenous Q6H PRN Floydene Flock, MD       Current Outpatient Medications  Medication Sig Dispense Refill    albuterol (VENTOLIN HFA) 108 (90 Base) MCG/ACT inhaler Inhale 2 puffs into the lungs every 6 (six) hours as needed for wheezing or shortness of breath.     ammonium lactate (AMLACTIN) 12 % cream Apply 1 Application topically every 12 (twelve) hours.     ARIPiprazole (ABILIFY) 20 MG tablet Take 20 mg by mouth in the morning.     ascorbic acid (VITAMIN C) 500 MG tablet Take 1 tablet (500 mg total) by mouth 2 (two) times daily.     buPROPion (BUDEPRION XL) 300 MG 24 hr tablet Take 1 tablet (300 mg total) by mouth daily. 30 tablet 0   carvedilol (COREG) 25 MG tablet Take 25 mg by mouth 2 (two) times daily.     cetirizine (ZYRTEC) 10 MG tablet Take 10 mg by mouth in the morning.     cholecalciferol (VITAMIN D3) 25 MCG (1000 UNIT) tablet Take 1,000 Units by mouth in the morning.     Cranberry 250 MG CAPS Take 250 mg by mouth 2 (two) times daily.     ELIQUIS 2.5 MG TABS tablet Take 2.5 mg by mouth 2 (two) times daily.     ferrous sulfate  325 (65 FE) MG tablet Take 325 mg by mouth daily with breakfast.     finasteride (PROSCAR) 5 MG tablet Take 1 tablet (5 mg total) by mouth daily. 30 tablet 0   fluticasone furoate-vilanterol (BREO ELLIPTA) 100-25 MCG/ACT AEPB Inhale 1 puff into the lungs daily.     furosemide (LASIX) 20 MG tablet Take 1 tablet (20 mg total) by mouth 2 (two) times daily. 30 tablet    guaiFENesin (ROBITUSSIN) 100 MG/5ML liquid Take 5 mLs by mouth every 4 (four) hours as needed for cough or to loosen phlegm. 120 mL 0   HYDROcodone-acetaminophen (NORCO/VICODIN) 5-325 MG tablet Take 1-2 tablets by mouth every 4 (four) hours as needed for moderate pain. 30 tablet 0   lactulose (CHRONULAC) 10 GM/15ML solution Take 15 mLs (10 g total) by mouth every 6 (six) hours as needed for mild constipation or moderate constipation. 236 mL 0   Multiple Vitamin (MULTI-VITAMINS) TABS Take 1 tablet by mouth daily.     nystatin (MYCOSTATIN/NYSTOP) powder Apply 1 Application topically 2 (two) times daily.      PHENobarbital (LUMINAL) 64.8 MG tablet Take 64.8 mg by mouth daily.     polyethylene glycol (MIRALAX / GLYCOLAX) 17 g packet Take 17 g by mouth daily as needed. 14 each 0   potassium chloride SA (KLOR-CON M) 20 MEQ tablet Take 1 tablet (20 mEq total) by mouth 2 (two) times daily.     pravastatin (PRAVACHOL) 10 MG tablet Take 10 mg by mouth at bedtime.     Suvorexant 5 MG TABS Take 5 mg by mouth at bedtime.     umeclidinium bromide (INCRUSE ELLIPTA) 62.5 MCG/ACT AEPB Inhale 1 puff into the lungs daily.     zinc oxide 20 % ointment Apply 1 application. topically 2 (two) times daily. (Apply to legs)     Dextromethorphan HBr 15 MG CAPS Take 2 capsules by mouth every 12 (twelve) hours. (Patient not taking: Reported on 12/19/2022)     folic acid (FOLVITE) 1 MG tablet Take 1 mg by mouth in the morning. (Patient not taking: Reported on 12/19/2022)       Objective: Vital signs in last 24 hours: Temp:  [97.9 F (36.6 C)-98.6 F (37 C)] 97.9 F (36.6 C) (05/13 0753) Pulse Rate:  [74-92] 82 (05/13 0753) Resp:  [11-23] 18 (05/13 0753) BP: (87-125)/(47-94) 125/56 (05/13 0753) SpO2:  [91 %-100 %] 100 % (05/13 0753) Weight:  [101.5 kg] 101.5 kg (05/12 1408)  Intake/Output from previous day: 05/12 0701 - 05/13 0700 In: 4100 [I.V.:500; IV Piggyback:3600] Out: -  Intake/Output this shift: No intake/output data recorded.   Physical Exam Constitutional:      General: He is not in acute distress.    Appearance: Normal appearance. He is obese.  HENT:     Head: Normocephalic.  Pulmonary:     Effort: Pulmonary effort is normal.  Abdominal:     General: Abdomen is flat.     Comments: SPT in place - draining cloudy yellow urine with good UOP   Skin:    General: Skin is warm and dry.  Neurological:     Mental Status: Mental status is at baseline.     Lab Results:  Recent Labs    12/19/22 1422  WBC 4.5  HGB 10.1*  HCT 33.4*  PLT 158   BMET Recent Labs    12/19/22 1422  NA 139  K 4.6   CL 109  CO2 22  GLUCOSE 91  BUN 41*  CREATININE 2.62*  CALCIUM 8.1*   PT/INR Recent Labs    12/19/22 1422  LABPROT 19.1*  INR 1.6*   ABG No results for input(s): "PHART", "HCO3" in the last 72 hours.  Invalid input(s): "PCO2", "PO2"  Studies/Results: CT ABDOMEN PELVIS WO CONTRAST  Result Date: 12/19/2022 CLINICAL DATA:  Abdominal pain. Patient was brought in for altered mental status, fever, chunky urine in suprapubic catheter, leaking colostomy, and bedsores. EXAM: CT ABDOMEN AND PELVIS WITHOUT CONTRAST TECHNIQUE: Multidetector CT imaging of the abdomen and pelvis was performed following the standard protocol without IV contrast. RADIATION DOSE REDUCTION: This exam was performed according to the departmental dose-optimization program which includes automated exposure control, adjustment of the mA and/or kV according to patient size and/or use of iterative reconstruction technique. COMPARISON:  CT abdomen pelvis 09/12/2022, 05/08/2022 FINDINGS: Of note, patient respiratory motion degrades image quality. Lower chest: Near-complete resolution of the peribronchovascular consolidation seen in the right lung base on 09/12/2022 with a few small residual nodular consolidations on today's exam. Visualized left lung is clear. Coronary artery calcification. Mitral and aortic valve calcification. Hepatobiliary: No focal liver abnormality is seen. No gallstones, gallbladder wall thickening, or biliary dilatation. Pancreas: Unremarkable. No pancreatic ductal dilatation or surrounding inflammatory changes. Spleen: Normal in size without focal abnormality. Adrenals/Urinary Tract: Adrenal glands are unremarkable. Atrophic kidneys. 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 Stone burden is unchanged compared to prior exams. No right hydronephrosis. Persistent mild left hydronephrosis and hydroureter with mild adjacent fat stranding, particularly at the level of the  proximal ureter and renal pelvis. A small amount of air is seen within the left renal collecting system, likely related to instrumentation/suprapubic catheter. Suprapubic catheter with the tip at the left UVJ. Small amount of non dependent air is noted within the urinary bladder. Diffuse bladder wall thickening is again noted, though is improved compared to 09/12/2022. Similarly, adjacent fat stranding seen on prior exam has also markedly improved. Stomach/Bowel: Stomach is within normal limits. Appendix appears normal. 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 There is persistent diffuse wall thickening throughout the rectum and perirectal fat stranding. Vascular/Lymphatic: Aortic atherosclerosis. No enlarged abdominal or pelvic lymph nodes. Reproductive: Prostate is unremarkable. Calcifications noted within the vas deferens bilaterally. Other: Diffuse anasarca.  No abdominopelvic ascites. Musculoskeletal: No acute or suspicious osseous findings. Multilevel degenerative disc disease throughout the visualized distal thoracic and lumbar spine. IMPRESSION: 1. Unchanged mild left hydronephrosis and hydroureter with new mild adjacent fat stranding. Findings are concerning for possible ascending urinary tract infection. Recommend correlation with urinalysis. 2. Suprapubic catheter with the tip at the left UVJ. Small amount of air is seen within the urinary bladder and left renal collecting system, likely related to instrumentation/suprapubic catheter. There is persistent diffuse bladder wall thickening, though improved compared to 09/12/2022. 3. 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 4. Near-complete resolution of the peribronchovascular consolidation seen in the right lung base on 09/12/2022. 5. Diffuse anasarca. 6. Aortic atherosclerosis. Aortic Atherosclerosis (ICD10-I70.0). Electronically Signed   By: Sherron Ales M.D.   On: 12/19/2022 16:38   DG Knee 2 Views  Right  Result Date: 12/19/2022 CLINICAL DATA:  Altered mental status.  Pain and fever. EXAM: RIGHT KNEE - 1-2 VIEW COMPARISON:  Tib fib study from 11/18/2016 FINDINGS: Fracture involving the nonweightbearing medial cortex of the medial femoral condyle is new in the interval but appears nonacute with adjacent bony callus evident. Similar cortical irregularity along the lateral nonweightbearing cortex of the lateral  femoral condyle. Subtle lucency along the growth plate of the proximal tibial metaphysis is stable. No evidence for joint effusion. Bones are demineralized. IMPRESSION: Fracture involving the nonweightbearing medial cortex of the medial femoral condyle is new in the interval but appears nonacute with adjacent bony callus evident. Diffuse bony demineralization without acute fracture or joint effusion. Electronically Signed   By: Kennith Center M.D.   On: 12/19/2022 15:36   DG Chest Port 1 View  Result Date: 12/19/2022 CLINICAL DATA:  Questionable sepsis. Acute mental status change. Fever. EXAM: PORTABLE CHEST 1 VIEW COMPARISON:  Chest x-ray May 06, 2022 FINDINGS: The heart size and mediastinal contours are within normal limits. Both lungs are clear. The visualized skeletal structures are unremarkable. IMPRESSION: No active disease. Electronically Signed   By: Gerome Sam III M.D.   On: 12/19/2022 15:34     Assessment: 82 year old male with a history of bipolar disorder, COPD, diastolic heart failure, DVT/PE on Eliquis, seizures, CKD and chronic bilateral renal calculi who presented to the ED with worsening confusion and cloudy output from the Foley catheter.  CT scan noted bilateral staghorn calculi which are stable and mild left hydronephrosis versus which is chronic.  The suprapubic tube tip was also found at the left UVJ.  It has since been exchanged by Dr. Alvester Morin.  He was scheduled for PCNL, but they could not obtain consent and the procedure was canceled.  He is scheduled to  follow-up at [facility] on February 04, 2023.  Plan: -I have contacted the inpatient lab and they stated they could not see the orders, so I have requested the CBC and CMP be drawn at this time -His vital signs are stable on fluid resuscitation and antibiotics -urine culture still pending - continue fluid resuscitation and antibiotics  -If a clinical decline is noted, it would be recommended he undergo nephrostomy tube placement -Continue to hold Eliquis until morning labs are available and then will reassess  Addendum: Today's labs demonstrate a slight improvement in renal function with serum creatinine at 2.25 with a GFR of 29 from serum creatinine of 2.62 to a GFR of 24 yesterday.  His WBC count is normal.  Hemoglobin is stable.  Continue to trend creatinine and if continued improvement may be able to restart Eliquis.      LOS: 1 day    [facility] 12/20/2022

## 2022-12-20 NOTE — ED Notes (Signed)
Pt's gown changed.

## 2022-12-20 NOTE — Progress Notes (Addendum)
Progress Note    [name]  YQM:[id number] DOB: 25-Nov-1940  DOA: 12/19/2022 PCP: Rosetta Posner, MD      Brief Narrative:    Medical records reviewed and are as summarized below:  [name] is a 82 y.o. male with medical history significant for hypertension, hyperlipidemia, GERD, COPD, chronic diastolic CHF, seizure disorder, BPH, suprapubic catheter in place, history of pulmonary embolism on Eliquis, probable CKD stage II, who was brought to the emergency department because of fever (101 F) and altered mental status.  He was tachycardia cardiac and hypotensive in the ED.   He was admitted to the hospital for severe sepsis secondary to acute complicated UTI complicated by acute kidney injury and acute metabolic encephalopathy.    Assessment/Plan:   Principal Problem:   Pyelonephritis Active Problems:   AKI (acute kidney injury) (HCC)   Chronic diastolic CHF (congestive heart failure) (HCC)   COPD (chronic obstructive pulmonary disease) (HCC)   Seizure disorder (HCC)   Benign essential hypertension   Gastroesophageal reflux disease   Body mass index is 32.11 kg/m.  (Obesity)   Severe sepsis secondary to acute complicated UTI, underlying suprapubic catheter: Continue IV cefepime.  Discontinue IV vancomycin and Flagyl.  Follow-up urine and blood cultures.   Acute kidney injury, probable CKD stage II: Creatinine is slowly trending down.  Discontinue IV fluid because of anasarca.  Monitor BMP closely. Hypotension: BP is better.   Acute metabolic encephalopathy: Improved   Seizure disorder: Resume home phenobarbital   Chronic diastolic CHF: Lasix on hold because of AKI.   COPD: Continue bronchodilators   BPH with suprapubic catheter in place: CT showed unchanged mild left hydronephrosis and hydroureter, new mild adjacent fat stranding concerning for ascending UTI.  He was evaluated by the urologist.  No plan for urostomy at this  point.   History of pulmonary embolism in September 2023: Eliquis on hold in case patient needs any procedures or surgical intervention.     Diet Order             Diet heart healthy/carb modified Room service appropriate? Yes; Fluid consistency: Thin  Diet effective now                            Consultants: Urologist  Procedures: None    Medications:    [START ON 12/21/2022] ARIPiprazole  20 mg Oral q AM   buPROPion  300 mg Oral Daily   enoxaparin (LOVENOX) injection  40 mg Subcutaneous Q24H   finasteride  5 mg Oral Daily   PHENobarbital  64.8 mg Oral Daily   Suvorexant  5 mg Oral QHS   [START ON 12/21/2022] umeclidinium bromide  1 puff Inhalation Daily   Continuous Infusions:  ceFEPime (MAXIPIME) IV       Anti-infectives (From admission, onward)    Start     Dose/Rate Route Frequency Ordered Stop   12/20/22 1400  ceFEPIme (MAXIPIME) 2 g in sodium chloride 0.9 % 100 mL IVPB        2 g 200 mL/hr over 30 Minutes Intravenous Every 24 hours 12/20/22 0844     12/19/22 1415  ceFEPIme (MAXIPIME) 2 g in sodium chloride 0.9 % 100 mL IVPB        2 g 200 mL/hr over 30 Minutes Intravenous  Once 12/19/22 1400 12/19/22 1456   12/19/22 1415  metroNIDAZOLE (FLAGYL) IVPB 500 mg        500  mg 100 mL/hr over 60 Minutes Intravenous  Once 12/19/22 1400 12/19/22 1540   12/19/22 1415  vancomycin (VANCOCIN) IVPB 1000 mg/200 mL premix  Status:  Discontinued        1,000 mg 200 mL/hr over 60 Minutes Intravenous  Once 12/19/22 1400 12/19/22 1409   12/19/22 1415  vancomycin (VANCOREADY) IVPB 2000 mg/400 mL        2,000 mg 200 mL/hr over 120 Minutes Intravenous  Once 12/19/22 1411 12/19/22 1646              Family Communication/Anticipated D/C date and plan/Code Status   DVT prophylaxis: enoxaparin (LOVENOX) injection 40 mg Start: 12/20/22 1700 SCDs Start: 12/19/22 1757     Code Status: Full Code  Family Communication: None Disposition Plan: Plan to  discharge to SNF in 3 to 4 days   Status is: Inpatient Remains inpatient appropriate because: Sepsis and AKI       Subjective:   Interval events noted.  He feels better.  No abdominal pain, shortness of breath or chest pain.  He said the redness on his skin is a chronic problem that has been present for over 9 months.  No itching.  Misty Stanley, RN, was at the bedside   Objective:    Vitals:   12/20/22 0730 12/20/22 0753 12/20/22 1100 12/20/22 1130  BP: (!) 113/55 (!) 125/56 (!) 108/54 (!) 114/56  Pulse:  82 70 83  Resp: 20 18 20 19   Temp:  97.9 F (36.6 C)    TempSrc:  Oral    SpO2:  100% 100% 100%  Weight:       No data found.   Intake/Output Summary (Last 24 hours) at 12/20/2022 1221 Last data filed at 12/19/2022 1646 Gross per 24 hour  Intake 4100 ml  Output --  Net 4100 ml   Filed Weights   12/19/22 1408  Weight: 101.5 kg    Exam:  GEN: NAD SKIN: Redness on the neck, trunk and upper and lower extremities EYES: EOMI ENT: MMM CV: RRR PULM: CTA B ABD: soft, distended, NT, +BS, +colostomy CNS: AAO x 2 , non focal EXT: B/l leg and pedal edema, no tenderness.  Erythema of bilateral upper and lower extremities. GU: Suprapubic catheter draining amber urine    Data Reviewed:   I have personally reviewed following labs and imaging studies:  Labs: Labs show the following:   Basic Metabolic Panel: Recent Labs  Lab 12/19/22 1422 12/20/22 0949  NA 139 139  K 4.6 3.9  CL 109 109  CO2 22 23  GLUCOSE 91 120*  BUN 41* 42*  CREATININE 2.62* 2.25*  CALCIUM 8.1* 7.9*   GFR Estimated Creatinine Clearance: 31.8 mL/min (A) (by C-G formula based on SCr of 2.25 mg/dL (H)). Liver Function Tests: Recent Labs  Lab 12/19/22 1422 12/20/22 0949  AST 12* 13*  ALT 10 13  ALKPHOS 65 60  BILITOT 0.5 0.6  PROT 5.7* 5.5*  ALBUMIN 2.4* 2.3*   No results for input(s): "LIPASE", "AMYLASE" in the last 168 hours. No results for input(s): "AMMONIA" in the last 168  hours. Coagulation profile Recent Labs  Lab 12/19/22 1422  INR 1.6*    CBC: Recent Labs  Lab 12/19/22 1422 12/20/22 0949  WBC 4.5 4.4  NEUTROABS 3.0  --   HGB 10.1* 9.0*  HCT 33.4* 28.9*  MCV 94.1 91.7  PLT 158 146*   Cardiac Enzymes: No results for input(s): "CKTOTAL", "CKMB", "CKMBINDEX", "TROPONINI" in the last 168 hours. BNP (last  3 results) No results for input(s): "PROBNP" in the last 8760 hours. CBG: No results for input(s): "GLUCAP" in the last 168 hours. D-Dimer: No results for input(s): "DDIMER" in the last 72 hours. Hgb A1c: No results for input(s): "HGBA1C" in the last 72 hours. Lipid Profile: No results for input(s): "CHOL", "HDL", "LDLCALC", "TRIG", "CHOLHDL", "LDLDIRECT" in the last 72 hours. Thyroid function studies: No results for input(s): "TSH", "T4TOTAL", "T3FREE", "THYROIDAB" in the last 72 hours.  Invalid input(s): "FREET3" Anemia work up: No results for input(s): "VITAMINB12", "FOLATE", "FERRITIN", "TIBC", "IRON", "RETICCTPCT" in the last 72 hours. Sepsis Labs: Recent Labs  Lab 12/19/22 1420 12/19/22 1422 12/20/22 0949  WBC  --  4.5 4.4  LATICACIDVEN 1.7  --   --     Microbiology Recent Results (from the past 240 hour(s))  Resp panel by RT-PCR (RSV, Flu A&B, Covid) Anterior Nasal Swab     Status: None   Collection Time: 12/19/22  2:20 PM   Specimen: Anterior Nasal Swab  Result Value Ref Range Status   SARS Coronavirus 2 by RT PCR NEGATIVE NEGATIVE Final    Comment: (NOTE) SARS-CoV-2 target nucleic acids are NOT DETECTED.  The SARS-CoV-2 RNA is generally detectable in upper respiratory specimens during the acute phase of infection. The lowest concentration of SARS-CoV-2 viral copies this assay can detect is 138 copies/mL. A negative result does not preclude SARS-Cov-2 infection and should not be used as the sole basis for treatment or other patient management decisions. A negative result may occur with  improper specimen  collection/handling, submission of specimen other than nasopharyngeal swab, presence of viral mutation(s) within the areas targeted by this assay, and inadequate number of viral copies(<138 copies/mL). A negative result must be combined with clinical observations, patient history, and epidemiological information. The expected result is Negative.  Fact Sheet for Patients:  BloggerCourse.com  Fact Sheet for Healthcare Providers:  SeriousBroker.it  This test is no t yet approved or cleared by the [place] FDA and  has been authorized for detection and/or diagnosis of SARS-CoV-2 by FDA under an Emergency Use Authorization (EUA). This EUA will remain  in effect (meaning this test can be used) for the duration of the COVID-19 declaration under Section 564(b)(1) of the Act, 21 U.S.C.section 360bbb-3(b)(1), unless the authorization is terminated  or revoked sooner.       Influenza A by PCR NEGATIVE NEGATIVE Final   Influenza B by PCR NEGATIVE NEGATIVE Final    Comment: (NOTE) The Xpert Xpress SARS-CoV-2/FLU/RSV plus assay is intended as an aid in the diagnosis of influenza from Nasopharyngeal swab specimens and should not be used as a sole basis for treatment. Nasal washings and aspirates are unacceptable for Xpert Xpress SARS-CoV-2/FLU/RSV testing.  Fact Sheet for Patients: BloggerCourse.com  Fact Sheet for Healthcare Providers: SeriousBroker.it  This test is not yet approved or cleared by the [place] FDA and has been authorized for detection and/or diagnosis of SARS-CoV-2 by FDA under an Emergency Use Authorization (EUA). This EUA will remain in effect (meaning this test can be used) for the duration of the COVID-19 declaration under Section 564(b)(1) of the Act, 21 U.S.C. section 360bbb-3(b)(1), unless the authorization is terminated or revoked.     Resp Syncytial  Virus by PCR NEGATIVE NEGATIVE Final    Comment: (NOTE) Fact Sheet for Patients: BloggerCourse.com  Fact Sheet for Healthcare Providers: SeriousBroker.it  This test is not yet approved or cleared by the [place] FDA and has been authorized for detection and/or diagnosis  of SARS-CoV-2 by FDA under an Emergency Use Authorization (EUA). This EUA will remain in effect (meaning this test can be used) for the duration of the COVID-19 declaration under Section 564(b)(1) of the Act, 21 U.S.C. section 360bbb-3(b)(1), unless the authorization is terminated or revoked.  Performed at [facility], [address] [place], [place] [address]   Blood Culture (routine x 2)     Status: None (Preliminary result)   Collection Time: 12/19/22  2:20 PM   Specimen: BLOOD  Result Value Ref Range Status   Specimen Description BLOOD BLOOD RIGHT HAND  Final   Special Requests   Final    BOTTLES DRAWN AEROBIC AND ANAEROBIC Blood Culture adequate volume   Culture   Final    NO GROWTH < 24 HOURS Performed at [facility], [address], [place], [place] [address]    Report Status PENDING  Incomplete    Procedures and diagnostic studies:  CT ABDOMEN PELVIS WO CONTRAST  Result Date: 12/19/2022 CLINICAL DATA:  Abdominal pain. Patient was brought in for altered mental status, fever, chunky urine in suprapubic catheter, leaking colostomy, and bedsores. EXAM: CT ABDOMEN AND PELVIS WITHOUT CONTRAST TECHNIQUE: Multidetector CT imaging of the abdomen and pelvis was performed following the standard protocol without IV contrast. RADIATION DOSE REDUCTION: This exam was performed according to the departmental dose-optimization program which includes automated exposure control, adjustment of the mA and/or kV according to patient size and/or use of iterative reconstruction technique. COMPARISON:  CT abdomen pelvis 09/12/2022, 05/08/2022 FINDINGS: Of  note, patient respiratory motion degrades image quality. Lower chest: Near-complete resolution of the peribronchovascular consolidation seen in the right lung base on 09/12/2022 with a few small residual nodular consolidations on today's exam. Visualized left lung is clear. Coronary artery calcification. Mitral and aortic valve calcification. Hepatobiliary: No focal liver abnormality is seen. No gallstones, gallbladder wall thickening, or biliary dilatation. Pancreas: Unremarkable. No pancreatic ductal dilatation or surrounding inflammatory changes. Spleen: Normal in size without focal abnormality. Adrenals/Urinary Tract: Adrenal glands are unremarkable. Atrophic kidneys. 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 Stone burden is unchanged compared to prior exams. No right hydronephrosis. Persistent mild left hydronephrosis and hydroureter with mild adjacent fat stranding, particularly at the level of the proximal ureter and renal pelvis. A small amount of air is seen within the left renal collecting system, likely related to instrumentation/suprapubic catheter. Suprapubic catheter with the tip at the left UVJ. Small amount of non dependent air is noted within the urinary bladder. Diffuse bladder wall thickening is again noted, though is improved compared to 09/12/2022. Similarly, adjacent fat stranding seen on prior exam has also markedly improved. Stomach/Bowel: Stomach is within normal limits. Appendix appears normal. 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 There is persistent diffuse wall thickening throughout the rectum and perirectal fat stranding. Vascular/Lymphatic: Aortic atherosclerosis. No enlarged abdominal or pelvic lymph nodes. Reproductive: Prostate is unremarkable. Calcifications noted within the vas deferens bilaterally. Other: Diffuse anasarca.  No abdominopelvic ascites. Musculoskeletal: No acute or suspicious  osseous findings. Multilevel degenerative disc disease throughout the visualized distal thoracic and lumbar spine. IMPRESSION: 1. Unchanged mild left hydronephrosis and hydroureter with new mild adjacent fat stranding. Findings are concerning for possible ascending urinary tract infection. Recommend correlation with urinalysis. 2. Suprapubic catheter with the tip at the left UVJ. Small amount of air is seen within the urinary bladder and left renal collecting system, likely related to instrumentation/suprapubic catheter. There is persistent diffuse bladder wall  thickening, though improved compared to 09/12/2022. 3. 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 4. Near-complete resolution of the peribronchovascular consolidation seen in the right lung base on 09/12/2022. 5. Diffuse anasarca. 6. Aortic atherosclerosis. Aortic Atherosclerosis (ICD10-I70.0). Electronically Signed   By: Sherron Ales M.D.   On: 12/19/2022 16:38   DG Knee 2 Views Right  Result Date: 12/19/2022 CLINICAL DATA:  Altered mental status.  Pain and fever. EXAM: RIGHT KNEE - 1-2 VIEW COMPARISON:  Tib fib study from 11/18/2016 FINDINGS: Fracture involving the nonweightbearing medial cortex of the medial femoral condyle is new in the interval but appears nonacute with adjacent bony callus evident. Similar cortical irregularity along the lateral nonweightbearing cortex of the lateral femoral condyle. Subtle lucency along the growth plate of the proximal tibial metaphysis is stable. No evidence for joint effusion. Bones are demineralized. IMPRESSION: Fracture involving the nonweightbearing medial cortex of the medial femoral condyle is new in the interval but appears nonacute with adjacent bony callus evident. Diffuse bony demineralization without acute fracture or joint effusion. Electronically Signed   By: Kennith Center M.D.   On: 12/19/2022 15:36   DG Chest Port 1 View  Result Date: 12/19/2022 CLINICAL DATA:  Questionable sepsis. Acute  mental status change. Fever. EXAM: PORTABLE CHEST 1 VIEW COMPARISON:  Chest x-ray May 06, 2022 FINDINGS: The heart size and mediastinal contours are within normal limits. Both lungs are clear. The visualized skeletal structures are unremarkable. IMPRESSION: No active disease. Electronically Signed   By: Gerome Sam III M.D.   On: 12/19/2022 15:34               LOS: 1 day   Lucan Riner  [facility]   Pager on [email or web address]. If 7PM-7AM, please contact night-coverage at [email or web address]     12/20/2022, 12:21 PM

## 2022-12-20 NOTE — Consult Note (Signed)
Pharmacy Antibiotic Note  [name] is a 82 y.o. male admitted on 12/19/2022 with UTI.  Pharmacy has been consulted for Cefepime dosing.  Plan: Cefepime 2gm IV q 24hrs  Weight: 101.5 kg (223 lb 12.3 oz)  Temp (24hrs), Avg:98.2 F (36.8 C), Min:97.9 F (36.6 C), Max:98.6 F (37 C)  Recent Labs  Lab 12/19/22 1420 12/19/22 1422  WBC  --  4.5  CREATININE  --  2.62*  LATICACIDVEN 1.7  --     Estimated Creatinine Clearance: 27.3 mL/min (A) (by C-G formula based on SCr of 2.62 mg/dL (H)).    Allergies  Allergen Reactions   Penicillins Other (See Comments)    Unknown reaction  Convulsions    Patient tolerated meropenem for several days at his facility prior to hospital arrival on 11/01/22   Penicillin G Rash and Other (See Comments)    Convulsions.    Has patient had a PCN reaction causing immediate rash, facial/tongue/throat swelling, SOB or lightheadedness with hypotension: not sure Has patient had a PCN reaction causing severe rash involving mucus membranes or skin necrosis: not sure Has patient had a PCN reaction that required hospitalization: not sure Has patient had a PCN reaction occurring within the last 10 years: not sure If all of the above answers are "NO", then may proceed with Cephalosporin use.    Antimicrobials this admission: 5/12 Metronidazole x 1 in ED 5/12 Vancomycin x 1 in ED 5/12 Cefepime >>   Dose adjustments this admission: n/a  Microbiology results: 5/12 BCx: NGTD  Thank you for allowing pharmacy to be a part of this patient's care.  Euan Wandler Rodriguez-Guzman PharmD, BCPS 12/20/2022 8:42 AM

## 2022-12-21 DIAGNOSIS — I5032 Chronic diastolic (congestive) heart failure: Secondary | ICD-10-CM | POA: Diagnosis not present

## 2022-12-21 DIAGNOSIS — N179 Acute kidney failure, unspecified: Secondary | ICD-10-CM | POA: Diagnosis not present

## 2022-12-21 DIAGNOSIS — N12 Tubulo-interstitial nephritis, not specified as acute or chronic: Secondary | ICD-10-CM | POA: Diagnosis not present

## 2022-12-21 DIAGNOSIS — L89312 Pressure ulcer of right buttock, stage 2: Secondary | ICD-10-CM | POA: Diagnosis present

## 2022-12-21 LAB — URINE CULTURE

## 2022-12-21 LAB — CBC WITH DIFFERENTIAL/PLATELET
Abs Immature Granulocytes: 0.01 10*3/uL (ref 0.00–0.07)
Basophils Absolute: 0 10*3/uL (ref 0.0–0.1)
Basophils Relative: 1 %
Eosinophils Absolute: 0.2 10*3/uL (ref 0.0–0.5)
Eosinophils Relative: 5 %
HCT: 26.8 % — ABNORMAL LOW (ref 39.0–52.0)
Hemoglobin: 8.4 g/dL — ABNORMAL LOW (ref 13.0–17.0)
Immature Granulocytes: 0 %
Lymphocytes Relative: 37 %
Lymphs Abs: 1.6 10*3/uL (ref 0.7–4.0)
MCH: 28.1 pg (ref 26.0–34.0)
MCHC: 31.3 g/dL (ref 30.0–36.0)
MCV: 89.6 fL (ref 80.0–100.0)
Monocytes Absolute: 0.4 10*3/uL (ref 0.1–1.0)
Monocytes Relative: 8 %
Neutro Abs: 2.2 10*3/uL (ref 1.7–7.7)
Neutrophils Relative %: 49 %
Platelets: 152 10*3/uL (ref 150–400)
RBC: 2.99 MIL/uL — ABNORMAL LOW (ref 4.22–5.81)
RDW: 16.6 % — ABNORMAL HIGH (ref 11.5–15.5)
Smear Review: NORMAL
WBC Morphology: INCREASED
WBC: 4.4 10*3/uL (ref 4.0–10.5)
nRBC: 0 % (ref 0.0–0.2)

## 2022-12-21 LAB — BASIC METABOLIC PANEL
Anion gap: 4 — ABNORMAL LOW (ref 5–15)
BUN: 39 mg/dL — ABNORMAL HIGH (ref 8–23)
CO2: 24 mmol/L (ref 22–32)
Calcium: 7.9 mg/dL — ABNORMAL LOW (ref 8.9–10.3)
Chloride: 113 mmol/L — ABNORMAL HIGH (ref 98–111)
Creatinine, Ser: 1.61 mg/dL — ABNORMAL HIGH (ref 0.61–1.24)
GFR, Estimated: 43 mL/min — ABNORMAL LOW (ref >60)
Glucose, Bld: 95 mg/dL (ref 70–99)
Potassium: 3.6 mmol/L (ref 3.5–5.1)
Sodium: 141 mmol/L (ref 135–145)

## 2022-12-21 LAB — CULTURE, BLOOD (ROUTINE X 2)

## 2022-12-21 MED ORDER — CHLORHEXIDINE GLUCONATE CLOTH 2 % EX PADS
6.0000 | MEDICATED_PAD | Freq: Every day | CUTANEOUS | Status: DC
  Administered 2022-12-21 – 2022-12-23 (×3): 6 via TOPICAL

## 2022-12-21 MED ORDER — SODIUM CHLORIDE 0.9 % IV SOLN
2.0000 g | Freq: Two times a day (BID) | INTRAVENOUS | Status: DC
  Administered 2022-12-21 – 2022-12-23 (×5): 2 g via INTRAVENOUS
  Filled 2022-12-21 (×5): qty 12.5

## 2022-12-21 MED ORDER — ENOXAPARIN SODIUM 60 MG/0.6ML IJ SOSY
0.5000 mg/kg | PREFILLED_SYRINGE | INTRAMUSCULAR | Status: DC
  Administered 2022-12-21: 50 mg via SUBCUTANEOUS
  Filled 2022-12-21: qty 0.6

## 2022-12-21 MED ORDER — ACETAMINOPHEN 325 MG PO TABS: 650.0000 mg | ORAL_TABLET | Freq: Four times a day (QID) | ORAL | Status: DC | PRN

## 2022-12-21 NOTE — Evaluation (Signed)
Occupational Therapy Evaluation Patient Details Name: [name] MRN: [id number] DOB: 07-21-1941 Today's Date: 12/21/2022   History of Present Illness [name] is a 82 y.o. male with medical history significant for hypertension, hyperlipidemia, GERD, COPD, chronic diastolic CHF, seizure disorder, BPH, suprapubic catheter in place, history of pulmonary embolism on Eliquis, probable CKD stage II, who was brought to the emergency department because of fever (101 F) and altered mental status.  He was tachycardia cardiac and hypotensive in the ED. He was admitted to the hospital for severe sepsis secondary to acute complicated UTI complicated by acute kidney injury and acute metabolic encephalopathy.   Clinical Impression   Upon entering the room, pt supine in bed and agreeable to OT intervention. Pt reports living at [facility]. He endorses that he no longer stands to transfer into chair with staff assistance and is mostly in the bed now. Staff assist with self care needs from bed level. It has been several months since he sat on EOB with assistance from staff. Pt needing max A of 2 for bed mobility to sit on EOB. L lateral lean in sitting requiring min cuing and use of mirror for visual feedback for upright positioning. Pt returns to bed in same manner. He is currently at his baseline. Pt does not need skilled acute OT intervention. OT to complete order.      Recommendations for follow up therapy are one component of a multi-disciplinary discharge planning process, led by the attending physician.  Recommendations may be updated based on patient status, additional functional criteria and insurance authorization.   Assistance Recommended at Discharge Frequent or constant Supervision/Assistance     Functional Status Assessment  Patient has not had a recent decline in their functional status  Equipment Recommendations  None recommended by OT       Precautions / Restrictions  Precautions Precautions: Fall Restrictions Weight Bearing Restrictions: No      Mobility Bed Mobility Overal bed mobility: Needs Assistance Bed Mobility: Supine to Sit, Sit to Supine     Supine to sit: HOB elevated, Max assist, +2 for physical assistance Sit to supine: Max assist, +2 for physical assistance        Transfers                   General transfer comment: Pt does not transfer at baseline      Balance Overall balance assessment: Needs assistance Sitting-balance support: Bilateral upper extremity supported, Feet supported Sitting balance-Leahy Scale: Fair Sitting balance - Comments: maintains static sitting                                   ADL either performed or assessed with clinical judgement   ADL Overall ADL's : Needs assistance/impaired;At baseline                                       General ADL Comments: total A for self care needs at bed level at baseline     Vision Patient Visual Report: No change from baseline              Pertinent Vitals/Pain Pain Assessment Pain Assessment: Faces Faces Pain Scale: No hurt     Hand Dominance Right   Extremity/Trunk Assessment Upper Extremity Assessment Upper Extremity Assessment: Generalized weakness   Lower  Extremity Assessment Lower Extremity Assessment: Defer to PT evaluation RLE Deficits / Details: Extremely red and irritated. Color worsens in dependent position with reports of increased pain. LLE Deficits / Details: Extremely red and irritated. Color worsens in dependent position with reports of increased pain.          Cognition Arousal/Alertness: Awake/alert Behavior During Therapy: WFL for tasks assessed/performed Overall Cognitive Status: Within Functional Limits for tasks assessed                                 General Comments: slow processing but follows 1 step commands with increased time                Home Living  Family/patient expects to be discharged to:: Skilled nursing facility                                 Additional Comments: [facility]      Prior Functioning/Environment Prior Level of Function : Needs assist       Physical Assist : Mobility (physical);ADLs (physical)     Mobility Comments: Now reports being bed bound and staff does not help him to transfer out of bed. ADLs Comments: Helps with rolling for toileting and bed changes                 OT Goals(Current goals can be found in the care plan section) Acute Rehab OT Goals Patient Stated Goal: to get into chair at facility OT Goal Formulation: With patient Time For Goal Achievement: 12/21/22 Potential to Achieve Goals: Fair  OT Frequency:      Co-evaluation   Reason for Co-Treatment: Necessary to address cognition/behavior during functional activity;For patient/therapist safety;To address functional/ADL transfers PT goals addressed during session: Mobility/safety with mobility;Balance OT goals addressed during session: ADL's and self-care      AM-PAC OT "6 Clicks" Daily Activity     Outcome Measure Help from another person eating meals?: Total Help from another person taking care of personal grooming?: Total Help from another person toileting, which includes using toliet, bedpan, or urinal?: Total Help from another person bathing (including washing, rinsing, drying)?: Total Help from another person to put on and taking off regular upper body clothing?: Total Help from another person to put on and taking off regular lower body clothing?: Total 6 Click Score: 6   End of Session Nurse Communication: Mobility status  Activity Tolerance: Patient tolerated treatment well Patient left: in bed;with call bell/phone within reach;with bed alarm set                   Time: 4098-1191 OT Time Calculation (min): 14 min Charges:  OT General Charges $OT Visit: 1 Visit OT Evaluation $OT Eval Moderate  Complexity: 1 [address] [place], OTR/L , CBIS ascom [phone]  12/21/22, 3:07 PM

## 2022-12-21 NOTE — Progress Notes (Signed)
PHARMACIST - PHYSICIAN COMMUNICATION  CONCERNING:  Enoxaparin (Lovenox) for DVT Prophylaxis    RECOMMENDATION: Patient was prescribed enoxaparin 40mg  q24 hours for VTE prophylaxis.   Filed Weights   12/19/22 1408 12/20/22 2053  Weight: 101.5 kg (223 lb 12.3 oz) 101.5 kg (223 lb 12.3 oz)    Body mass index is 32.11 kg/m.  Estimated Creatinine Clearance: 44.4 mL/min (A) (by C-G formula based on SCr of 1.61 mg/dL (H)).   Based on [facility] policy patient is candidate for enoxaparin 0.5mg /kg TBW SQ every 24 hours based on BMI being >30.   DESCRIPTION: Pharmacy has adjusted enoxaparin dose per [facility] policy.  Patient is now receiving enoxaparin 0.5 mg/kg every 24 hours    Will M. Dareen Piano, PharmD PGY-1 Pharmacy Resident 12/21/2022 8:00 AM

## 2022-12-21 NOTE — Progress Notes (Signed)
PHARMACY NOTE:  ANTIMICROBIAL RENAL DOSAGE ADJUSTMENT  Current antimicrobial regimen includes a mismatch between antimicrobial dosage and estimated renal function.  As per policy approved by the Pharmacy & Therapeutics and Medical Executive Committees, the antimicrobial dosage will be adjusted accordingly.  Current antimicrobial dosage:  cefepime 2 g IV every 24 hours  Indication: Acute complicated pyelonephritis  Renal Function:  Estimated Creatinine Clearance: 44.4 mL/min (A) (by C-G formula based on SCr of 1.61 mg/dL (H)). []      On intermittent HD, scheduled: []      On CRRT    Antimicrobial dosage has been changed to:  cefepime 2 g IV every 12 hours  Additional comments:   Thank you for allowing pharmacy to be a part of this patient's care.  Will M. Dareen Piano, PharmD PGY-1 Pharmacy Resident 12/21/2022 8:04 AM

## 2022-12-21 NOTE — Progress Notes (Addendum)
Progress Note    [name]  ZOX:[id number] DOB: Feb 04, 1941  DOA: 12/19/2022 PCP: Rosetta Posner, MD      Brief Narrative:    Medical records reviewed and are as summarized below:  [name] is a 82 y.o. male with medical history significant for hypertension, hyperlipidemia, GERD, COPD, chronic diastolic CHF, seizure disorder, BPH, suprapubic catheter in place, history of pulmonary embolism on Eliquis, probable CKD stage II, who was brought to the emergency department because of fever (101 F) and altered mental status.  He was tachycardia cardiac and hypotensive in the ED.   He was admitted to the hospital for severe sepsis secondary to acute complicated UTI complicated by acute kidney injury and acute metabolic encephalopathy.    Assessment/Plan:   Principal Problem:   Severe sepsis (HCC) Active Problems:   AKI (acute kidney injury) (HCC)   Pyelonephritis   Acute metabolic encephalopathy   Chronic diastolic CHF (congestive heart failure) (HCC)   COPD (chronic obstructive pulmonary disease) (HCC)   Seizure disorder (HCC)   Benign essential hypertension   Gastroesophageal reflux disease   Body mass index is 32.11 kg/m.  (Obesity)   Severe sepsis secondary to acute complicated UTI, underlying suprapubic catheter: No growth on blood cultures thus far.  Urine culture showing multiple species.  Continue IV cefepime for now.   Acute kidney injury, probable CKD stage II: Creatinine is trending downward.  Monitor BMP off of IV fluids.   Hypotension: Resolved.   Acute metabolic encephalopathy: Resolved.  His mental status is back to baseline   Seizure disorder: Continue phenobarbital   Chronic diastolic CHF: Lasix on hold because of AKI.   COPD: Continue bronchodilators   BPH with suprapubic catheter in place: CT showed unchanged mild left hydronephrosis and hydroureter, new mild adjacent fat stranding concerning for ascending UTI. No plan for  urostomy for now.  Follow-up with urologist.   History of pulmonary embolism in September 2023: Eliquis on hold in case patient needs any procedures or surgical intervention.   Stage II decubitus ulcer on right buttock: Present on admission.  continue local wound care.   Debility: PT and OT evaluation   Diet Order             Diet heart healthy/carb modified Room service appropriate? Yes; Fluid consistency: Thin  Diet effective now                            Consultants: Urologist  Procedures: None    Medications:    ARIPiprazole  20 mg Oral Daily   buPROPion  300 mg Oral Daily   Chlorhexidine Gluconate Cloth  6 each Topical Q0600   enoxaparin (LOVENOX) injection  0.5 mg/kg Subcutaneous Q24H   finasteride  5 mg Oral Daily   PHENobarbital  64.8 mg Oral Daily   umeclidinium bromide  1 puff Inhalation Daily   Continuous Infusions:  ceFEPime (MAXIPIME) IV 2 g (12/21/22 0930)     Anti-infectives (From admission, onward)    Start     Dose/Rate Route Frequency Ordered Stop   12/21/22 1000  ceFEPIme (MAXIPIME) 2 g in sodium chloride 0.9 % 100 mL IVPB        2 g 200 mL/hr over 30 Minutes Intravenous Every 12 hours 12/21/22 0803     12/20/22 1400  ceFEPIme (MAXIPIME) 2 g in sodium chloride 0.9 % 100 mL IVPB  Status:  Discontinued  2 g 200 mL/hr over 30 Minutes Intravenous Every 24 hours 12/20/22 0844 12/21/22 0803   12/19/22 1415  ceFEPIme (MAXIPIME) 2 g in sodium chloride 0.9 % 100 mL IVPB        2 g 200 mL/hr over 30 Minutes Intravenous  Once 12/19/22 1400 12/19/22 1456   12/19/22 1415  metroNIDAZOLE (FLAGYL) IVPB 500 mg        500 mg 100 mL/hr over 60 Minutes Intravenous  Once 12/19/22 1400 12/19/22 1540   12/19/22 1415  vancomycin (VANCOCIN) IVPB 1000 mg/200 mL premix  Status:  Discontinued        1,000 mg 200 mL/hr over 60 Minutes Intravenous  Once 12/19/22 1400 12/19/22 1409   12/19/22 1415  vancomycin (VANCOREADY) IVPB 2000 mg/400 mL         2,000 mg 200 mL/hr over 120 Minutes Intravenous  Once 12/19/22 1411 12/19/22 1646              Family Communication/Anticipated D/C date and plan/Code Status   DVT prophylaxis: SCDs Start: 12/19/22 1757     Code Status: Full Code  Family Communication: None Disposition Plan: Plan to discharge to SNF in 3 to 4 days   Status is: Inpatient Remains inpatient appropriate because: Sepsis and AKI       Subjective:   Interval events noted.  He complains of no abdominal pain.  No other complaints.   Objective:    Vitals:   12/20/22 1730 12/20/22 2053 12/21/22 0535 12/21/22 0911  BP: (!) 114/55 (!) 114/55 (!) 119/59 129/74  Pulse: 79 79 85 78  Resp: (!) 22 20 18 16   Temp: 97.8 F (36.6 C) 97.8 F (36.6 C) 97.7 F (36.5 C) 98.1 F (36.7 C)  TempSrc:  Oral    SpO2: 100%  99% 99%  Weight:  101.5 kg     No data found.   Intake/Output Summary (Last 24 hours) at 12/21/2022 1203 Last data filed at 12/21/2022 0706 Gross per 24 hour  Intake --  Output 800 ml  Net -800 ml   Filed Weights   12/19/22 1408 12/20/22 2053  Weight: 101.5 kg 101.5 kg    Exam:  GEN: NAD SKIN: ?Chronic erythematous changes on the neck, trunk, upper and lower extremities.  Stage II decubitus ulcer on right buttock EYES: No pallor or icterus ENT: MMM CV: RRR PULM: CTA B ABD: soft, distended, NT, +BS, + colostomy CNS: AAO x 3, non focal EXT: Bilateral leg edema, no tenderness GU: Suprapubic catheter draining amber urine   Data Reviewed:   I have personally reviewed following labs and imaging studies:  Labs: Labs show the following:   Basic Metabolic Panel: Recent Labs  Lab 12/19/22 1422 12/20/22 0949 12/21/22 0526  NA 139 139 141  K 4.6 3.9 3.6  CL 109 109 113*  CO2 22 23 24   GLUCOSE 91 120* 95  BUN 41* 42* 39*  CREATININE 2.62* 2.25* 1.61*  CALCIUM 8.1* 7.9* 7.9*   GFR Estimated Creatinine Clearance: 44.4 mL/min (A) (by C-G formula based on SCr of 1.61 mg/dL  (H)). Liver Function Tests: Recent Labs  Lab 12/19/22 1422 12/20/22 0949  AST 12* 13*  ALT 10 13  ALKPHOS 65 60  BILITOT 0.5 0.6  PROT 5.7* 5.5*  ALBUMIN 2.4* 2.3*   No results for input(s): "LIPASE", "AMYLASE" in the last 168 hours. No results for input(s): "AMMONIA" in the last 168 hours. Coagulation profile Recent Labs  Lab 12/19/22 1422  INR 1.6*  CBC: Recent Labs  Lab 12/19/22 1422 12/20/22 0949 12/21/22 0526  WBC 4.5 4.4 4.4  NEUTROABS 3.0  --  2.2  HGB 10.1* 9.0* 8.4*  HCT 33.4* 28.9* 26.8*  MCV 94.1 91.7 89.6  PLT 158 146* 152   Cardiac Enzymes: No results for input(s): "CKTOTAL", "CKMB", "CKMBINDEX", "TROPONINI" in the last 168 hours. BNP (last 3 results) No results for input(s): "PROBNP" in the last 8760 hours. CBG: No results for input(s): "GLUCAP" in the last 168 hours. D-Dimer: No results for input(s): "DDIMER" in the last 72 hours. Hgb A1c: No results for input(s): "HGBA1C" in the last 72 hours. Lipid Profile: No results for input(s): "CHOL", "HDL", "LDLCALC", "TRIG", "CHOLHDL", "LDLDIRECT" in the last 72 hours. Thyroid function studies: No results for input(s): "TSH", "T4TOTAL", "T3FREE", "THYROIDAB" in the last 72 hours.  Invalid input(s): "FREET3" Anemia work up: No results for input(s): "VITAMINB12", "FOLATE", "FERRITIN", "TIBC", "IRON", "RETICCTPCT" in the last 72 hours. Sepsis Labs: Recent Labs  Lab 12/19/22 1420 12/19/22 1422 12/20/22 0949 12/21/22 0526  WBC  --  4.5 4.4 4.4  LATICACIDVEN 1.7  --   --   --     Microbiology Recent Results (from the past 240 hour(s))  Resp panel by RT-PCR (RSV, Flu A&B, Covid) Anterior Nasal Swab     Status: None   Collection Time: 12/19/22  2:20 PM   Specimen: Anterior Nasal Swab  Result Value Ref Range Status   SARS Coronavirus 2 by RT PCR NEGATIVE NEGATIVE Final    Comment: (NOTE) SARS-CoV-2 target nucleic acids are NOT DETECTED.  The SARS-CoV-2 RNA is generally detectable in upper  respiratory specimens during the acute phase of infection. The lowest concentration of SARS-CoV-2 viral copies this assay can detect is 138 copies/mL. A negative result does not preclude SARS-Cov-2 infection and should not be used as the sole basis for treatment or other patient management decisions. A negative result may occur with  improper specimen collection/handling, submission of specimen other than nasopharyngeal swab, presence of viral mutation(s) within the areas targeted by this assay, and inadequate number of viral copies(<138 copies/mL). A negative result must be combined with clinical observations, patient history, and epidemiological information. The expected result is Negative.  Fact Sheet for Patients:  BloggerCourse.com  Fact Sheet for Healthcare Providers:  SeriousBroker.it  This test is no t yet approved or cleared by the [place] FDA and  has been authorized for detection and/or diagnosis of SARS-CoV-2 by FDA under an Emergency Use Authorization (EUA). This EUA will remain  in effect (meaning this test can be used) for the duration of the COVID-19 declaration under Section 564(b)(1) of the Act, 21 U.S.C.section 360bbb-3(b)(1), unless the authorization is terminated  or revoked sooner.       Influenza A by PCR NEGATIVE NEGATIVE Final   Influenza B by PCR NEGATIVE NEGATIVE Final    Comment: (NOTE) The Xpert Xpress SARS-CoV-2/FLU/RSV plus assay is intended as an aid in the diagnosis of influenza from Nasopharyngeal swab specimens and should not be used as a sole basis for treatment. Nasal washings and aspirates are unacceptable for Xpert Xpress SARS-CoV-2/FLU/RSV testing.  Fact Sheet for Patients: BloggerCourse.com  Fact Sheet for Healthcare Providers: SeriousBroker.it  This test is not yet approved or cleared by the [place] FDA and has been  authorized for detection and/or diagnosis of SARS-CoV-2 by FDA under an Emergency Use Authorization (EUA). This EUA will remain in effect (meaning this test can be used) for the duration of the COVID-19 declaration  under Section 564(b)(1) of the Act, 21 U.S.C. section 360bbb-3(b)(1), unless the authorization is terminated or revoked.     Resp Syncytial Virus by PCR NEGATIVE NEGATIVE Final    Comment: (NOTE) Fact Sheet for Patients: BloggerCourse.com  Fact Sheet for Healthcare Providers: SeriousBroker.it  This test is not yet approved or cleared by the [place] FDA and has been authorized for detection and/or diagnosis of SARS-CoV-2 by FDA under an Emergency Use Authorization (EUA). This EUA will remain in effect (meaning this test can be used) for the duration of the COVID-19 declaration under Section 564(b)(1) of the Act, 21 U.S.C. section 360bbb-3(b)(1), unless the authorization is terminated or revoked.  Performed at [facility], [address] [place], [place] [address]   Blood Culture (routine x 2)     Status: None (Preliminary result)   Collection Time: 12/19/22  2:20 PM   Specimen: BLOOD  Result Value Ref Range Status   Specimen Description BLOOD BLOOD RIGHT HAND  Final   Special Requests   Final    BOTTLES DRAWN AEROBIC AND ANAEROBIC Blood Culture adequate volume   Culture   Final    NO GROWTH 2 DAYS Performed at [facility], [address], [place], [place] [address]    Report Status PENDING  Incomplete  Urine Culture     Status: Abnormal (Preliminary result)   Collection Time: 12/19/22  4:15 PM   Specimen: Urine, Random  Result Value Ref Range Status   Specimen Description   Final    URINE, RANDOM Performed at [facility], [address] [place], [place] [address]    Special Requests   Final    NONE Reflexed from [phone] Performed at [facility], [address] [place], [place] [address]    Culture MULTIPLE SPECIES PRESENT, SUGGEST RECOLLECTION (A)  Final   Report Status PENDING  Incomplete  Culture, blood (Routine X 2) w Reflex to ID Panel     Status: None (Preliminary result)   Collection Time: 12/20/22  8:31 AM   Specimen: BLOOD  Result Value Ref Range Status   Specimen Description BLOOD RIGHT ANTECUBITAL  Final   Special Requests   Final    BOTTLES DRAWN AEROBIC AND ANAEROBIC Blood Culture adequate volume   Culture   Final    NO GROWTH < 24 HOURS Performed at [facility], [address] [place], [place] [address]    Report Status PENDING  Incomplete    Procedures and diagnostic studies:  CT ABDOMEN PELVIS WO CONTRAST  Result Date: 12/19/2022 CLINICAL DATA:  Abdominal pain. Patient was brought in for altered mental status, fever, chunky urine in suprapubic catheter, leaking colostomy, and bedsores. EXAM: CT ABDOMEN AND PELVIS WITHOUT CONTRAST TECHNIQUE: Multidetector CT imaging of the abdomen and pelvis was performed following the standard protocol without IV contrast. RADIATION DOSE REDUCTION: This exam was performed according to the departmental dose-optimization program which includes automated exposure control, adjustment of the mA and/or kV according to patient size and/or use of iterative reconstruction technique. COMPARISON:  CT abdomen pelvis 09/12/2022, 05/08/2022 FINDINGS: Of note, patient respiratory motion degrades image quality. Lower chest: Near-complete resolution of the peribronchovascular consolidation seen in the right lung base on 09/12/2022 with a few small residual nodular consolidations on today's exam. Visualized left lung is clear. Coronary artery calcification. Mitral and aortic valve calcification. Hepatobiliary: No focal liver abnormality is seen. No gallstones, gallbladder wall thickening, or biliary dilatation. Pancreas: Unremarkable. No pancreatic ductal dilatation or surrounding inflammatory  changes.  Spleen: Normal in size without focal abnormality. Adrenals/Urinary Tract: Adrenal glands are unremarkable. Atrophic kidneys. 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 Stone burden is unchanged compared to prior exams. No right hydronephrosis. Persistent mild left hydronephrosis and hydroureter with mild adjacent fat stranding, particularly at the level of the proximal ureter and renal pelvis. A small amount of air is seen within the left renal collecting system, likely related to instrumentation/suprapubic catheter. Suprapubic catheter with the tip at the left UVJ. Small amount of non dependent air is noted within the urinary bladder. Diffuse bladder wall thickening is again noted, though is improved compared to 09/12/2022. Similarly, adjacent fat stranding seen on prior exam has also markedly improved. Stomach/Bowel: Stomach is within normal limits. Appendix appears normal. 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 There is persistent diffuse wall thickening throughout the rectum and perirectal fat stranding. Vascular/Lymphatic: Aortic atherosclerosis. No enlarged abdominal or pelvic lymph nodes. Reproductive: Prostate is unremarkable. Calcifications noted within the vas deferens bilaterally. Other: Diffuse anasarca.  No abdominopelvic ascites. Musculoskeletal: No acute or suspicious osseous findings. Multilevel degenerative disc disease throughout the visualized distal thoracic and lumbar spine. IMPRESSION: 1. Unchanged mild left hydronephrosis and hydroureter with new mild adjacent fat stranding. Findings are concerning for possible ascending urinary tract infection. Recommend correlation with urinalysis. 2. Suprapubic catheter with the tip at the left UVJ. Small amount of air is seen within the urinary bladder and left renal collecting system, likely related to instrumentation/suprapubic catheter. There is persistent diffuse bladder  wall thickening, though improved compared to 09/12/2022. 3. 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 4. Near-complete resolution of the peribronchovascular consolidation seen in the right lung base on 09/12/2022. 5. Diffuse anasarca. 6. Aortic atherosclerosis. Aortic Atherosclerosis (ICD10-I70.0). Electronically Signed   By: Sherron Ales M.D.   On: 12/19/2022 16:38   DG Knee 2 Views Right  Result Date: 12/19/2022 CLINICAL DATA:  Altered mental status.  Pain and fever. EXAM: RIGHT KNEE - 1-2 VIEW COMPARISON:  Tib fib study from 11/18/2016 FINDINGS: Fracture involving the nonweightbearing medial cortex of the medial femoral condyle is new in the interval but appears nonacute with adjacent bony callus evident. Similar cortical irregularity along the lateral nonweightbearing cortex of the lateral femoral condyle. Subtle lucency along the growth plate of the proximal tibial metaphysis is stable. No evidence for joint effusion. Bones are demineralized. IMPRESSION: Fracture involving the nonweightbearing medial cortex of the medial femoral condyle is new in the interval but appears nonacute with adjacent bony callus evident. Diffuse bony demineralization without acute fracture or joint effusion. Electronically Signed   By: Kennith Center M.D.   On: 12/19/2022 15:36   DG Chest Port 1 View  Result Date: 12/19/2022 CLINICAL DATA:  Questionable sepsis. Acute mental status change. Fever. EXAM: PORTABLE CHEST 1 VIEW COMPARISON:  Chest x-ray May 06, 2022 FINDINGS: The heart size and mediastinal contours are within normal limits. Both lungs are clear. The visualized skeletal structures are unremarkable. IMPRESSION: No active disease. Electronically Signed   By: Gerome Sam III M.D.   On: 12/19/2022 15:34               LOS: 2 days   Airen Dales  [facility]   Pager on [email or web address]. If 7PM-7AM, please contact night-coverage at [email or web address]     12/21/2022, 12:03 PM

## 2022-12-21 NOTE — Evaluation (Signed)
Physical Therapy Evaluation Patient Details Name: [name] MRN: [id number] DOB: [birth year] Today's Date: 12/21/2022  History of Present Illness  [name] is a 82 y.o. male with medical history significant for hypertension, hyperlipidemia, GERD, COPD, chronic diastolic CHF, seizure disorder, BPH, suprapubic catheter in place, history of pulmonary embolism on Eliquis, probable CKD stage II, who was brought to the emergency department because of fever (101 F) and altered mental status.  He was tachycardia cardiac and hypotensive in the ED. He was admitted to the hospital for severe sepsis secondary to acute complicated UTI complicated by acute kidney injury and acute metabolic encephalopathy.   Clinical Impression  Pt admitted with above diagnosis. Pt currently with functional limitations due to the deficits listed below (see PT Problem List). Pt received upright in bed agreeable to PT/OT co-eval for pt/therapist safety. Pt reports at baseline he is bed bound at [facility] and dependent for all mobility needs with only assisting with rolling tasks for linens change and toileting needs.   To date, pt is reliant on maxA+2 to sit EOB. Pt is able to initiate LE's to EoB but ultimately reliant on 2 person assist to sit up. Pt is able to maintain static sitting ~3 minutes before displaying core weakness with L lateral lean requiring VC's to correct with limited carryover. Pt reports fatigue and need to return to supine with maxA+2 to return. Pt appears at baseline. No acute PT needs. PT to sign off.       Recommendations for follow up therapy are one component of a multi-disciplinary discharge planning process, led by the attending physician.  Recommendations may be updated based on patient status, additional functional criteria and insurance authorization.  Follow Up Recommendations Can patient physically be transported by private vehicle: No     Assistance Recommended at Discharge     Patient can return home with the following       Equipment Recommendations    Recommendations for Other Services       Functional Status Assessment Patient has not had a recent decline in their functional status     Precautions / Restrictions Precautions Precautions: Fall Restrictions Weight Bearing Restrictions: No      Mobility  Bed Mobility Overal bed mobility: Needs Assistance Bed Mobility: Supine to Sit, Sit to Supine     Supine to sit: HOB elevated, Max assist, +2 for physical assistance Sit to supine: Max assist, +2 for physical assistance     Patient Response: Cooperative  Transfers                   General transfer comment: Pt does not transfer at baseline    Ambulation/Gait               General Gait Details: Pt does not walk at baseline  Stairs            Wheelchair Mobility    Modified Rankin (Stroke Patients Only)       Balance Overall balance assessment: Needs assistance Sitting-balance support: Bilateral upper extremity supported, Feet supported Sitting balance-Leahy Scale: Fair Sitting balance - Comments: maintains static sitting                                     Pertinent Vitals/Pain Pain Assessment Pain Assessment: Faces Faces Pain Scale: No hurt    Home Living Family/patient expects to be discharged to:: Skilled nursing facility  Additional Comments: [facility]    Prior Function Prior Level of Function : Needs assist       Physical Assist : Mobility (physical);ADLs (physical)     Mobility Comments: Now reports being bed bound and staff does not help him to transfer out of bed. ADLs Comments: Helps with rolling for toileting and bed changes     Hand Dominance   Dominant Hand: Right    Extremity/Trunk Assessment   Upper Extremity Assessment Upper Extremity Assessment: Generalized weakness    Lower Extremity Assessment Lower Extremity Assessment:  Defer to PT evaluation RLE Deficits / Details: Extremely red and irritated. Color worsens in dependent position with reports of increased pain. LLE Deficits / Details: Extremely red and irritated. Color worsens in dependent position with reports of increased pain.       Communication      Cognition Arousal/Alertness: Awake/alert Behavior During Therapy: WFL for tasks assessed/performed Overall Cognitive Status: Within Functional Limits for tasks assessed                                          General Comments      Exercises     Assessment/Plan    PT Assessment Patient does not need any further PT services  PT Problem List Decreased balance       PT Treatment Interventions      PT Goals (Current goals can be found in the Care Plan section)  Acute Rehab PT Goals PT Goal Formulation: All assessment and education complete, DC therapy    Frequency       Co-evaluation PT/OT/SLP Co-Evaluation/Treatment: Yes Reason for Co-Treatment: Necessary to address cognition/behavior during functional activity;For patient/therapist safety;To address functional/ADL transfers PT goals addressed during session: Mobility/safety with mobility;Balance OT goals addressed during session: ADL's and self-care       AM-PAC PT "6 Clicks" Mobility  Outcome Measure Help needed turning from your back to your side while in a flat bed without using bedrails?: A Lot Help needed moving from lying on your back to sitting on the side of a flat bed without using bedrails?: A Lot Help needed moving to and from a bed to a chair (including a wheelchair)?: Total Help needed standing up from a chair using your arms (e.g., wheelchair or bedside chair)?: Total Help needed to walk in hospital room?: Total Help needed climbing 3-5 steps with a railing? : Total 6 Click Score: 8    End of Session   Activity Tolerance: Patient tolerated treatment well Patient left: in bed;with call bell/phone  within reach;with bed alarm set Nurse Communication: Mobility status PT Visit Diagnosis: Muscle weakness (generalized) (M62.81)    Time: 9604-5409 PT Time Calculation (min) (ACUTE ONLY): 15 min   Charges:   PT Evaluation $PT Eval Low Complexity: 1 Low         Vaishali Baise M. Fairly IV, PT, DPT Physical Therapist- [place]  [facility]  12/21/2022, 3:14 PM

## 2022-12-22 DIAGNOSIS — A419 Sepsis, unspecified organism: Secondary | ICD-10-CM

## 2022-12-22 DIAGNOSIS — N390 Urinary tract infection, site not specified: Secondary | ICD-10-CM | POA: Diagnosis not present

## 2022-12-22 DIAGNOSIS — R6520 Severe sepsis without septic shock: Secondary | ICD-10-CM | POA: Diagnosis not present

## 2022-12-22 DIAGNOSIS — N179 Acute kidney failure, unspecified: Secondary | ICD-10-CM | POA: Diagnosis not present

## 2022-12-22 LAB — CBC
HCT: 25.8 % — ABNORMAL LOW (ref 39.0–52.0)
Hemoglobin: 8.3 g/dL — ABNORMAL LOW (ref 13.0–17.0)
MCH: 28.5 pg (ref 26.0–34.0)
MCHC: 32.2 g/dL (ref 30.0–36.0)
MCV: 88.7 fL (ref 80.0–100.0)
Platelets: 160 10*3/uL (ref 150–400)
RBC: 2.91 MIL/uL — ABNORMAL LOW (ref 4.22–5.81)
RDW: 16.4 % — ABNORMAL HIGH (ref 11.5–15.5)
WBC: 5.3 10*3/uL (ref 4.0–10.5)
nRBC: 0 % (ref 0.0–0.2)

## 2022-12-22 LAB — BASIC METABOLIC PANEL
Anion gap: 6 (ref 5–15)
BUN: 34 mg/dL — ABNORMAL HIGH (ref 8–23)
CO2: 23 mmol/L (ref 22–32)
Calcium: 8.1 mg/dL — ABNORMAL LOW (ref 8.9–10.3)
Chloride: 112 mmol/L — ABNORMAL HIGH (ref 98–111)
Creatinine, Ser: 1.25 mg/dL — ABNORMAL HIGH (ref 0.61–1.24)
GFR, Estimated: 59 mL/min — ABNORMAL LOW (ref >60)
Glucose, Bld: 87 mg/dL (ref 70–99)
Potassium: 3.7 mmol/L (ref 3.5–5.1)
Sodium: 141 mmol/L (ref 135–145)

## 2022-12-22 LAB — CULTURE, BLOOD (ROUTINE X 2): Culture: NO GROWTH

## 2022-12-22 LAB — URINE CULTURE

## 2022-12-22 MED ORDER — FLUTICASONE FUROATE-VILANTEROL 100-25 MCG/ACT IN AEPB
1.0000 | INHALATION_SPRAY | Freq: Every day | RESPIRATORY_TRACT | Status: DC
  Administered 2022-12-22 – 2022-12-23 (×2): 1 via RESPIRATORY_TRACT
  Filled 2022-12-22: qty 28

## 2022-12-22 MED ORDER — APIXABAN 2.5 MG PO TABS
2.5000 mg | ORAL_TABLET | Freq: Two times a day (BID) | ORAL | Status: DC
  Administered 2022-12-22 – 2022-12-23 (×2): 2.5 mg via ORAL
  Filled 2022-12-22 (×2): qty 1

## 2022-12-22 NOTE — [place] FL2 (Signed)
[place] MEDICAID FL2 LEVEL OF CARE FORM     IDENTIFICATION  Patient Name: [name] Birthdate: 02-16-1941 Sex: male Admission Date (Current Location): 12/19/2022  [facility] and [place] Number:  Chiropodist and Address:  [facility], [address], [place], [place] [address]      Provider Number: [id number]  Attending Physician Name and Address:  Charise Killian, MD  Relative Name and Phone Number:       Current Level of Care: Hospital Recommended Level of Care:  (LTC) Prior Approval Number:    Date Approved/Denied:   PASRR Number: [id number]  Discharge Plan:  (LTC)    Current Diagnoses: Patient Active Problem List   Diagnosis Date Noted   Decubitus ulcer of right buttock, stage 2 (HCC) 12/21/2022   Pyelonephritis 12/19/2022   History of pulmonary embolus (PE) 06/06/2022   Metabolic acidosis, normal anion gap (NAG) 05/15/2022   Sigmoid volvulus (HCC) 05/12/2022   Acute deep vein thrombosis (DVT) of left lower extremity (HCC)    Acute respiratory failure with hypoxia (HCC)    Malnutrition of moderate degree 05/03/2022   COPD exacerbation (HCC) 05/02/2022   Pulmonary embolism (HCC) 05/02/2022   Dysphagia 01/12/2022   Cellulitis of left lower extremity 01/10/2022   Hypomagnesemia 01/10/2022   Hypertension    Depression    Chronic diastolic CHF (congestive heart failure) (HCC)    Chronic kidney disease, stage 3a (HCC)    Acute metabolic encephalopathy    Anemia    Hematuria 11/25/2016   Guaiac positive stools 11/25/2016   Pressure injury of skin 09/23/2016   Back pain 05/28/2016   Severe sepsis (HCC) 05/27/2016   UTI (urinary tract infection) 05/27/2016   DVT of lower extremity, bilateral (HCC) 05/27/2016   Bladder outlet obstruction 05/27/2016   BPH (benign prostatic hyperplasia) 05/27/2016   AKI (acute kidney injury) (HCC) 05/27/2016   Bipolar disorder (HCC) 05/27/2016   Generalized weakness 05/27/2016    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) 08/07/2015   Hypokalemia 02/21/2015   Venous insufficiency of both lower extremities 12/04/2014   Benign essential hypertension 11/25/2014   Bilateral carotid artery stenosis 11/15/2014   Bilateral leg edema 11/15/2014   COPD (chronic obstructive pulmonary disease) (HCC) 11/15/2014   Gastroesophageal reflux disease 11/15/2014   Mild aortic valve stenosis 11/15/2014   Mixed hyperlipidemia 11/15/2014   Obesity 11/15/2014   Seizure disorder (HCC) 11/15/2014    Orientation RESPIRATION BLADDER Height & Weight     Self, Time, Situation, Place  Normal Incontinent (suprapubic cath) Weight: 223 lb 12.3 oz (101.5 kg) Height:     BEHAVIORAL SYMPTOMS/MOOD NEUROLOGICAL BOWEL NUTRITION STATUS      Incontinent Diet  AMBULATORY STATUS COMMUNICATION OF NEEDS Skin     Verbally  (PI to buttocks stage 2 foam dressing daily)                       Personal Care Assistance Level of Assistance  Bathing, Feeding, Dressing           Functional Limitations Info  Sight, Hearing, Speech Sight Info: Adequate Hearing Info: Adequate Speech Info: Adequate    SPECIAL CARE FACTORS FREQUENCY                       Contractures Contractures Info: Not present    Additional Factors Info  Code Status, Isolation Precautions, Allergies Code Status Info:  FULL Allergies Info: Penicillins  Penicillin G     Isolation Precautions Info: VRE     Current Medications (12/22/2022):  This is the current hospital active medication list Current Facility-Administered Medications  Medication Dose Route Frequency Provider Last Rate Last Admin   acetaminophen (TYLENOL) tablet 650 mg  650 mg Oral Q6H PRN Lurene Shadow, MD       apixaban Everlene Balls) tablet 2.5 mg  2.5 mg Oral BID Fabienne Bruns M, MD       ARIPiprazole (ABILIFY) tablet 20 mg  20 mg Oral Daily Lurene Shadow, MD   20 mg at 12/22/22  0959   buPROPion (WELLBUTRIN XL) 24 hr tablet 300 mg  300 mg Oral Daily Lurene Shadow, MD   300 mg at 12/22/22 0959   ceFEPIme (MAXIPIME) 2 g in sodium chloride 0.9 % 100 mL IVPB  2 g Intravenous Q12H Orson Aloe, RPH 200 mL/hr at 12/22/22 1006 2 g at 12/22/22 1006   Chlorhexidine Gluconate Cloth 2 % PADS 6 each  6 each Topical Q0600 Lurene Shadow, MD   6 each at 12/22/22 0542   finasteride (PROSCAR) tablet 5 mg  5 mg Oral Daily Lurene Shadow, MD   5 mg at 12/22/22 0959   fluticasone furoate-vilanterol (BREO ELLIPTA) 100-25 MCG/ACT 1 puff  1 puff Inhalation Daily Charise Killian, MD       ondansetron [facility]) tablet 4 mg  4 mg Oral Q6H PRN Floydene Flock, MD       Or   ondansetron [facility]) injection 4 mg  4 mg Intravenous Q6H PRN Floydene Flock, MD       PHENobarbital (LUMINAL) tablet 64.8 mg  64.8 mg Oral Daily Lurene Shadow, MD   64.8 mg at 12/22/22 0959   umeclidinium bromide (INCRUSE ELLIPTA) 62.5 MCG/ACT 1 puff  1 puff Inhalation Daily Lurene Shadow, MD   1 puff at 12/22/22 1004     Discharge Medications: Please see discharge summary for a list of discharge medications.  TAKE these medications     albuterol 108 (90 Base) MCG/ACT inhaler Commonly known as: VENTOLIN HFA Inhale 2 puffs into the lungs every 6 (six) hours as needed for wheezing or shortness of breath.    ammonium lactate 12 % cream Commonly known as: AMLACTIN Apply 1 Application topically every 12 (twelve) hours.    ARIPiprazole 20 MG tablet Commonly known as: ABILIFY Take 20 mg by mouth in the morning.    ascorbic acid 500 MG tablet Commonly known as: VITAMIN C Take 1 tablet (500 mg total) by mouth 2 (two) times daily.    buPROPion 300 MG 24 hr tablet Commonly known as: Budeprion XL Take 1 tablet (300 mg total) by mouth daily.    carvedilol 25 MG tablet Commonly known as: COREG Take 25 mg by mouth 2 (two) times daily.    cefdinir 300 MG capsule Commonly known as: OMNICEF Take 1  capsule (300 mg total) by mouth 2 (two) times daily for 4 days.    cetirizine 10 MG tablet Commonly known as: ZYRTEC Take 10 mg by mouth in the morning.    cholecalciferol 25 MCG (1000 UNIT) tablet Commonly known as: VITAMIN D3 Take 1,000 Units by mouth in the morning.    Cranberry 250 MG Caps Take 250 mg by mouth 2 (two) times daily.    Eliquis 2.5 MG Tabs tablet Generic drug: apixaban Take 2.5 mg by mouth 2 (two) times daily.    ferrous sulfate 325 (65 FE) MG  tablet Take 325 mg by mouth daily with breakfast.    finasteride 5 MG tablet Commonly known as: PROSCAR Take 1 tablet (5 mg total) by mouth daily.    fluticasone furoate-vilanterol 100-25 MCG/ACT Aepb Commonly known as: BREO ELLIPTA Inhale 1 puff into the lungs daily.    furosemide 20 MG tablet Commonly known as: LASIX Take 1 tablet (20 mg total) by mouth 2 (two) times daily.    guaiFENesin 100 MG/5ML liquid Commonly known as: ROBITUSSIN Take 5 mLs by mouth every 4 (four) hours as needed for cough or to loosen phlegm.    HYDROcodone-acetaminophen 5-325 MG tablet Commonly known as: NORCO/VICODIN Take 1-2 tablets by mouth every 4 (four) hours as needed for up to 1 day for moderate pain or severe pain. What changed: reasons to take this    lactulose 10 GM/15ML solution Commonly known as: CHRONULAC Take 15 mLs (10 g total) by mouth every 6 (six) hours as needed for mild constipation or moderate constipation.    Multi-Vitamins Tabs Take 1 tablet by mouth daily.    nystatin powder Commonly known as: MYCOSTATIN/NYSTOP Apply 1 Application topically 2 (two) times daily.    PHENobarbital 64.8 MG tablet Commonly known as: LUMINAL Take 64.8 mg by mouth daily.    polyethylene glycol 17 g packet Commonly known as: MIRALAX / GLYCOLAX Take 17 g by mouth daily as needed.    potassium chloride SA 20 MEQ tablet Commonly known as: KLOR-CON M Take 1 tablet (20 mEq total) by mouth 2 (two) times daily.    pravastatin  10 MG tablet Commonly known as: PRAVACHOL Take 10 mg by mouth at bedtime.    Suvorexant 5 MG Tabs Take 5 mg by mouth at bedtime.    umeclidinium bromide 62.5 MCG/ACT Aepb Commonly known as: INCRUSE ELLIPTA Inhale 1 puff into the lungs daily.    zinc oxide 20 % ointment Apply 1 application. topically 2 (two) times daily. (Apply to legs)         Relevant Imaging Results:  Relevant Lab Results:   Additional Information SS # [id number] LCSW

## 2022-12-22 NOTE — Progress Notes (Signed)
PROGRESS NOTE    [name]  ZOX:[id number] DOB: Jan 13, 1941 DOA: 12/19/2022 PCP: Rosetta Posner, MD  Assessment & Plan:   Principal Problem:   Severe sepsis [facility]) Active Problems:   AKI (acute kidney injury) (HCC)   Pyelonephritis   Acute metabolic encephalopathy   Chronic diastolic CHF (congestive heart failure) (HCC)   COPD (chronic obstructive pulmonary disease) (HCC)   Seizure disorder (HCC)   Benign essential hypertension   Gastroesophageal reflux disease   Decubitus ulcer of right buttock, stage 2 (HCC)  Assessment and Plan: Severe sepsis: secondary to acute complicated UTI, underlying suprapubic catheter. Blood cxs NGTD.  Urine culture showing multiple species.  Continue IV cefepime   AKI on CKDII: Cr is trending down daily. Avoid nephrotoxic meds   Hypotension: resolved.   Acute metabolic encephalopathy: resolved   Seizure disorder: continue on phenobarbital   Chronic diastolic CHF:  appears compensated. Monitor I/Os. Holding lasix secondary to AKI    COPD: w/o exacerbation. Continue on bronchodilators    BPH: with suprapubic catheter in place. CT showed unchanged mild left hydronephrosis and hydroureter, new mild adjacent fat stranding concerning for ascending UTI. No plan for urostomy for now.  F/u outpatient w/ urology    Hx of pulmonary embolism: in September 2023. Restart eliquis    Stage II decubitus ulcer on right buttock: present on admission. Continue w/ wound care    Debility: PT/OT recs long term care w/o therapy f/u      DVT prophylaxis: eliquis Code Status: full  Family Communication:  Disposition Plan: will d/c back to [facility]   Level of care: Med-Surg  Status is: Inpatient Remains inpatient appropriate because: severity of illness   Consultants:    Procedures:  Antimicrobials:    Subjective: Pt c/o fatigue   Objective: Vitals:   12/21/22 0911 12/21/22 1611 12/21/22 2013 12/22/22 0356  BP: 129/74 125/73 133/72 136/84   Pulse: 78 78 87 77  Resp: 16 20 18 17   Temp: 98.1 F (36.7 C) 98.8 F (37.1 C) 98.4 F (36.9 C) 97.8 F (36.6 C)  TempSrc:   Oral   SpO2: 99% 100% 100% 100%  Weight:        Intake/Output Summary (Last 24 hours) at 12/22/2022 0738 Last data filed at 12/22/2022 0604 Gross per 24 hour  Intake 216.06 ml  Output 2300 ml  Net -2083.94 ml   Filed Weights   12/19/22 1408 12/20/22 2053  Weight: 101.5 kg 101.5 kg    Examination:  General exam: Appears calm and comfortable  Respiratory system: diminished breath sounds b/l  Cardiovascular system: S1 & S2+. No rubs, gallops or clicks.  Gastrointestinal system: Abdomen is nondistended, soft and nontender. No organomegaly or masses felt. Normal bowel sounds heard. Central nervous system: Alert and awake.  Psychiatry: Judgement and insight appears at baseline. Flat mood and affect    Data Reviewed: I have personally reviewed following labs and imaging studies  CBC: Recent Labs  Lab 12/19/22 1422 12/20/22 0949 12/21/22 0526 12/22/22 0404  WBC 4.5 4.4 4.4 5.3  NEUTROABS 3.0  --  2.2  --   HGB 10.1* 9.0* 8.4* 8.3*  HCT 33.4* 28.9* 26.8* 25.8*  MCV 94.1 91.7 89.6 88.7  PLT 158 146* 152 160   Basic Metabolic Panel: Recent Labs  Lab 12/19/22 1422 12/20/22 0949 12/21/22 0526 12/22/22 0404  NA 139 139 141 141  K 4.6 3.9 3.6 3.7  CL 109 109 113* 112*  CO2 22 23 24 23   GLUCOSE 91 120*  95 87  BUN 41* 42* 39* 34*  CREATININE 2.62* 2.25* 1.61* 1.25*  CALCIUM 8.1* 7.9* 7.9* 8.1*   GFR: Estimated Creatinine Clearance: 57.2 mL/min (A) (by C-G formula based on SCr of 1.25 mg/dL (H)). Liver Function Tests: Recent Labs  Lab 12/19/22 1422 12/20/22 0949  AST 12* 13*  ALT 10 13  ALKPHOS 65 60  BILITOT 0.5 0.6  PROT 5.7* 5.5*  ALBUMIN 2.4* 2.3*   No results for input(s): "LIPASE", "AMYLASE" in the last 168 hours. No results for input(s): "AMMONIA" in the last 168 hours. Coagulation Profile: Recent Labs  Lab 12/19/22 1422   INR 1.6*   Cardiac Enzymes: No results for input(s): "CKTOTAL", "CKMB", "CKMBINDEX", "TROPONINI" in the last 168 hours. BNP (last 3 results) No results for input(s): "PROBNP" in the last 8760 hours. HbA1C: No results for input(s): "HGBA1C" in the last 72 hours. CBG: No results for input(s): "GLUCAP" in the last 168 hours. Lipid Profile: No results for input(s): "CHOL", "HDL", "LDLCALC", "TRIG", "CHOLHDL", "LDLDIRECT" in the last 72 hours. Thyroid Function Tests: No results for input(s): "TSH", "T4TOTAL", "FREET4", "T3FREE", "THYROIDAB" in the last 72 hours. Anemia Panel: No results for input(s): "VITAMINB12", "FOLATE", "FERRITIN", "TIBC", "IRON", "RETICCTPCT" in the last 72 hours. Sepsis Labs: Recent Labs  Lab 12/19/22 1420  LATICACIDVEN 1.7    Recent Results (from the past 240 hour(s))  Resp panel by RT-PCR (RSV, Flu A&B, Covid) Anterior Nasal Swab     Status: None   Collection Time: 12/19/22  2:20 PM   Specimen: Anterior Nasal Swab  Result Value Ref Range Status   SARS Coronavirus 2 by RT PCR NEGATIVE NEGATIVE Final    Comment: (NOTE) SARS-CoV-2 target nucleic acids are NOT DETECTED.  The SARS-CoV-2 RNA is generally detectable in upper respiratory specimens during the acute phase of infection. The lowest concentration of SARS-CoV-2 viral copies this assay can detect is 138 copies/mL. A negative result does not preclude SARS-Cov-2 infection and should not be used as the sole basis for treatment or other patient management decisions. A negative result may occur with  improper specimen collection/handling, submission of specimen other than nasopharyngeal swab, presence of viral mutation(s) within the areas targeted by this assay, and inadequate number of viral copies(<138 copies/mL). A negative result must be combined with clinical observations, patient history, and epidemiological information. The expected result is Negative.  Fact Sheet for Patients:   BloggerCourse.com  Fact Sheet for Healthcare Providers:  SeriousBroker.it  This test is no t yet approved or cleared by the [place] FDA and  has been authorized for detection and/or diagnosis of SARS-CoV-2 by FDA under an Emergency Use Authorization (EUA). This EUA will remain  in effect (meaning this test can be used) for the duration of the COVID-19 declaration under Section 564(b)(1) of the Act, 21 U.S.C.section 360bbb-3(b)(1), unless the authorization is terminated  or revoked sooner.       Influenza A by PCR NEGATIVE NEGATIVE Final   Influenza B by PCR NEGATIVE NEGATIVE Final    Comment: (NOTE) The Xpert Xpress SARS-CoV-2/FLU/RSV plus assay is intended as an aid in the diagnosis of influenza from Nasopharyngeal swab specimens and should not be used as a sole basis for treatment. Nasal washings and aspirates are unacceptable for Xpert Xpress SARS-CoV-2/FLU/RSV testing.  Fact Sheet for Patients: BloggerCourse.com  Fact Sheet for Healthcare Providers: SeriousBroker.it  This test is not yet approved or cleared by the [place] FDA and has been authorized for detection and/or diagnosis of SARS-CoV-2 by FDA  under an Emergency Use Authorization (EUA). This EUA will remain in effect (meaning this test can be used) for the duration of the COVID-19 declaration under Section 564(b)(1) of the Act, 21 U.S.C. section 360bbb-3(b)(1), unless the authorization is terminated or revoked.     Resp Syncytial Virus by PCR NEGATIVE NEGATIVE Final    Comment: (NOTE) Fact Sheet for Patients: BloggerCourse.com  Fact Sheet for Healthcare Providers: SeriousBroker.it  This test is not yet approved or cleared by the [place] FDA and has been authorized for detection and/or diagnosis of SARS-CoV-2 by FDA under an Emergency Use  Authorization (EUA). This EUA will remain in effect (meaning this test can be used) for the duration of the COVID-19 declaration under Section 564(b)(1) of the Act, 21 U.S.C. section 360bbb-3(b)(1), unless the authorization is terminated or revoked.  Performed at [facility], [address] [place], [place] [address]   Blood Culture (routine x 2)     Status: None (Preliminary result)   Collection Time: 12/19/22  2:20 PM   Specimen: BLOOD  Result Value Ref Range Status   Specimen Description BLOOD BLOOD RIGHT HAND  Final   Special Requests   Final    BOTTLES DRAWN AEROBIC AND ANAEROBIC Blood Culture adequate volume   Culture   Final    NO GROWTH 2 DAYS Performed at [facility], [address] [place], [place] [address]    Report Status PENDING  Incomplete  Urine Culture     Status: Abnormal (Preliminary result)   Collection Time: 12/19/22  4:15 PM   Specimen: Urine, Random  Result Value Ref Range Status   Specimen Description   Final    URINE, RANDOM Performed at [facility], [address] [place], [place] [address]    Special Requests   Final    NONE Reflexed from [id number] Performed at [facility], [address] [place], [place] [address]    Culture MULTIPLE SPECIES PRESENT, SUGGEST RECOLLECTION (A)  Final   Report Status PENDING  Incomplete  Culture, blood (Routine X 2) w Reflex to ID Panel     Status: None (Preliminary result)   Collection Time: 12/20/22  8:31 AM   Specimen: BLOOD  Result Value Ref Range Status   Specimen Description BLOOD RIGHT ANTECUBITAL  Final   Special Requests   Final    BOTTLES DRAWN AEROBIC AND ANAEROBIC Blood Culture adequate volume   Culture   Final    NO GROWTH < 24 HOURS Performed at [facility], [address] [place], [place] [address]    Report Status PENDING  Incomplete         Radiology Studies: No results found.      Scheduled Meds:  ARIPiprazole  20 mg Oral  Daily   buPROPion  300 mg Oral Daily   Chlorhexidine Gluconate Cloth  6 each Topical Q0600   enoxaparin (LOVENOX) injection  0.5 mg/kg Subcutaneous Q24H   finasteride  5 mg Oral Daily   PHENobarbital  64.8 mg Oral Daily   umeclidinium bromide  1 puff Inhalation Daily   Continuous Infusions:  ceFEPime (MAXIPIME) IV Stopped (12/21/22 2210)     LOS: 3 days    Time spent: 25 mins     Charise Killian, MD [facility] Pager [phone]  If 7PM-7AM, please contact night-coverage [email or web address] 12/22/2022, 7:38 AM

## 2022-12-22 NOTE — TOC Initial Note (Addendum)
Transition of Care [facility]) - Initial/Assessment Note    Patient Details  Name: [name] MRN: [id number] Date of Birth: 1940-09-01  [facility]) CM/SW Contact:    Allena Katz, LCSW Phone Number: 12/22/2022, 10:08 AM  Clinical Narrative:  Pt admitted for Severe Sepsis. Pt from [facility] for LTC. CSW confirmed with Tammy they are able to accept back when medically ready. Tammy will need a new FL2 for patient when she goes back. Pt has urology follow up on June 28th that [facility] will coordinate getting pt to at discharge. Pt has suprapubic catheter and also has a UTI. Pt also has colostomy.    CSW will arrange EMS transport when pt is ready to discharge back to facility.                     Patient Goals and CMS Choice            Expected Discharge Plan and Services                                              Prior Living Arrangements/Services                       Activities of Daily Living Home Assistive Devices/Equipment: Other (Comment) ADL Screening (condition at time of admission) Patient's cognitive ability adequate to safely complete daily activities?: Yes Is the patient deaf or have difficulty hearing?: No Does the patient have difficulty seeing, even when wearing glasses/contacts?: No Does the patient have difficulty concentrating, remembering, or making decisions?: No Patient able to express need for assistance with ADLs?: Yes Does the patient have difficulty dressing or bathing?: Yes Independently performs ADLs?: No Communication: Independent Dressing (OT): Dependent Is this a change from baseline?: Pre-admission baseline Grooming: Dependent Is this a change from baseline?: Pre-admission baseline Feeding: Needs assistance Is this a change from baseline?: Pre-admission baseline Bathing: Dependent Is this a change from baseline?: Pre-admission baseline Toileting: Dependent Is this a change from baseline?: Pre-admission  baseline In/Out Bed: Dependent Is this a change from baseline?: Pre-admission baseline Does the patient have difficulty walking or climbing stairs?: Yes Weakness of Legs: Both Weakness of Arms/Hands: Both  Permission Sought/Granted                  Emotional Assessment              Admission diagnosis:  Pyelonephritis [N12] Lower abdominal pain [R10.30] Altered mental status, unspecified altered mental status type [R41.82] Sepsis without acute organ dysfunction, due to unspecified organism [facility]) [A41.9] Patient Active Problem List   Diagnosis Date Noted   Decubitus ulcer of right buttock, stage 2 (HCC) 12/21/2022   Pyelonephritis 12/19/2022   History of pulmonary embolus (PE) 06/06/2022   Metabolic acidosis, normal anion gap (NAG) 05/15/2022   Sigmoid volvulus (HCC) 05/12/2022   Acute deep vein thrombosis (DVT) of left lower extremity (HCC)    Acute respiratory failure with hypoxia (HCC)    Malnutrition of moderate degree 05/03/2022   COPD exacerbation (HCC) 05/02/2022   Pulmonary embolism (HCC) 05/02/2022   Dysphagia 01/12/2022   Cellulitis of left lower extremity 01/10/2022   Hypomagnesemia 01/10/2022   Hypertension    Depression    Chronic diastolic CHF (congestive heart failure) (HCC)    Chronic kidney disease, stage 3a (HCC)    Acute  metabolic encephalopathy    Anemia    Hematuria 11/25/2016   Guaiac positive stools 11/25/2016   Pressure injury of skin 09/23/2016   Back pain 05/28/2016   Severe sepsis (HCC) 05/27/2016   UTI (urinary tract infection) 05/27/2016   DVT of lower extremity, bilateral (HCC) 05/27/2016   Bladder outlet obstruction 05/27/2016   BPH (benign prostatic hyperplasia) 05/27/2016   AKI (acute kidney injury) (HCC) 05/27/2016   Bipolar disorder (HCC) 05/27/2016   Generalized weakness 05/27/2016   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 [place] [address] Phone: [phone] Fax: [phone]     Social Determinants of Health (SDOH) Social History: SDOH Screenings   Food Insecurity: No Food Insecurity (12/20/2022)  Housing: Low Risk  (12/20/2022)  Transportation Needs: No Transportation Needs (12/20/2022)  Utilities: Not At Risk (12/20/2022)  Tobacco Use: Low Risk  (12/20/2022)   SDOH Interventions:     Readmission Risk Interventions    12/22/2022   10:08 AM 05/04/2022   12:15 PM  Readmission Risk Prevention Plan  Transportation Screening Complete Complete  Medication Review (RN Care Manager) Complete Complete  PCP or Specialist appointment within 3-5 days of discharge  Complete  HRI or Home Care Consult Complete   SW Recovery Care/Counseling Consult  Complete  Palliative Care Screening  Not Applicable  Skilled Nursing Facility Complete Complete

## 2022-12-23 DIAGNOSIS — N182 Chronic kidney disease, stage 2 (mild): Secondary | ICD-10-CM | POA: Diagnosis not present

## 2022-12-23 DIAGNOSIS — A419 Sepsis, unspecified organism: Secondary | ICD-10-CM | POA: Diagnosis not present

## 2022-12-23 DIAGNOSIS — T83511A Infection and inflammatory reaction due to indwelling urethral catheter, initial encounter: Secondary | ICD-10-CM | POA: Diagnosis not present

## 2022-12-23 DIAGNOSIS — R6520 Severe sepsis without septic shock: Secondary | ICD-10-CM | POA: Diagnosis not present

## 2022-12-23 LAB — CBC
HCT: 29.2 % — ABNORMAL LOW (ref 39.0–52.0)
Hemoglobin: 9.1 g/dL — ABNORMAL LOW (ref 13.0–17.0)
MCH: 28 pg (ref 26.0–34.0)
MCHC: 31.2 g/dL (ref 30.0–36.0)
MCV: 89.8 fL (ref 80.0–100.0)
Platelets: 183 10*3/uL (ref 150–400)
RBC: 3.25 MIL/uL — ABNORMAL LOW (ref 4.22–5.81)
RDW: 16.2 % — ABNORMAL HIGH (ref 11.5–15.5)
WBC: 5.5 10*3/uL (ref 4.0–10.5)
nRBC: 0 % (ref 0.0–0.2)

## 2022-12-23 LAB — BASIC METABOLIC PANEL
Anion gap: 6 (ref 5–15)
BUN: 24 mg/dL — ABNORMAL HIGH (ref 8–23)
CO2: 22 mmol/L (ref 22–32)
Calcium: 8.2 mg/dL — ABNORMAL LOW (ref 8.9–10.3)
Chloride: 112 mmol/L — ABNORMAL HIGH (ref 98–111)
Creatinine, Ser: 0.96 mg/dL (ref 0.61–1.24)
GFR, Estimated: >60 mL/min (ref >60)
Glucose, Bld: 93 mg/dL (ref 70–99)
Potassium: 3.3 mmol/L — ABNORMAL LOW (ref 3.5–5.1)
Sodium: 140 mmol/L (ref 135–145)

## 2022-12-23 LAB — CULTURE, BLOOD (ROUTINE X 2): Special Requests: ADEQUATE

## 2022-12-23 MED ORDER — HYDROCODONE-ACETAMINOPHEN 5-325 MG PO TABS: 1.0000 | ORAL_TABLET | ORAL | 0 refills | Status: AC | PRN

## 2022-12-23 MED ORDER — CEFDINIR 300 MG PO CAPS: 300.0000 mg | ORAL_CAPSULE | Freq: Two times a day (BID) | ORAL | 0 refills | Status: AC

## 2022-12-23 NOTE — Plan of Care (Signed)
  Problem: Education: Goal: Knowledge of General Education information will improve Description: Including pain rating scale, medication(s)/side effects and non-pharmacologic comfort measures 12/23/2022 1644 by Chinita Pester, RN Outcome: Adequate for Discharge 12/23/2022 0941 by Chinita Pester, RN Outcome: Progressing   Problem: Health Behavior/Discharge Planning: Goal: Ability to manage health-related needs will improve 12/23/2022 1644 by Chinita Pester, RN Outcome: Adequate for Discharge 12/23/2022 0941 by Chinita Pester, RN Outcome: Progressing   Problem: Clinical Measurements: Goal: Ability to maintain clinical measurements within normal limits will improve 12/23/2022 1644 by Chinita Pester, RN Outcome: Adequate for Discharge 12/23/2022 0941 by Chinita Pester, RN Outcome: Progressing Goal: Will remain free from infection 12/23/2022 1644 by Chinita Pester, RN Outcome: Adequate for Discharge 12/23/2022 0941 by Chinita Pester, RN Outcome: Progressing Goal: Diagnostic test results will improve 12/23/2022 1644 by Chinita Pester, RN Outcome: Adequate for Discharge 12/23/2022 0941 by Chinita Pester, RN Outcome: Progressing Goal: Respiratory complications will improve 12/23/2022 1644 by Chinita Pester, RN Outcome: Adequate for Discharge 12/23/2022 0941 by Chinita Pester, RN Outcome: Progressing Goal: Cardiovascular complication will be avoided 12/23/2022 1644 by Chinita Pester, RN Outcome: Adequate for Discharge 12/23/2022 0941 by Chinita Pester, RN Outcome: Progressing   Problem: Activity: Goal: Risk for activity intolerance will decrease 12/23/2022 1644 by Chinita Pester, RN Outcome: Adequate for Discharge 12/23/2022 0941 by Chinita Pester, RN Outcome: Progressing   Problem: Nutrition: Goal: Adequate nutrition will be maintained 12/23/2022 1644 by Chinita Pester, RN Outcome: Adequate for Discharge 12/23/2022 0941 by Chinita Pester, RN Outcome: Progressing   Problem: Coping: Goal: Level of anxiety will decrease 12/23/2022 1644 by Chinita Pester, RN Outcome: Adequate for Discharge 12/23/2022 0941 by Chinita Pester, RN Outcome: Progressing   Problem: Elimination: Goal: Will not experience complications related to bowel motility 12/23/2022 1644 by Chinita Pester, RN Outcome: Adequate for Discharge 12/23/2022 0941 by Chinita Pester, RN Outcome: Progressing Goal: Will not experience complications related to urinary retention 12/23/2022 1644 by Chinita Pester, RN Outcome: Adequate for Discharge 12/23/2022 0941 by Chinita Pester, RN Outcome: Progressing   Problem: Pain Managment: Goal: General experience of comfort will improve 12/23/2022 1644 by Chinita Pester, RN Outcome: Adequate for Discharge 12/23/2022 0941 by Chinita Pester, RN Outcome: Progressing   Problem: Safety: Goal: Ability to remain free from injury will improve 12/23/2022 1644 by Chinita Pester, RN Outcome: Adequate for Discharge 12/23/2022 0941 by Chinita Pester, RN Outcome: Progressing   Problem: Skin Integrity: Goal: Risk for impaired skin integrity will decrease 12/23/2022 1644 by Chinita Pester, RN Outcome: Adequate for Discharge 12/23/2022 0941 by Chinita Pester, RN Outcome: Progressing

## 2022-12-23 NOTE — Progress Notes (Signed)
Attempted to exchange suprapubic catheter per orders. When deflated balloon, only 25 cc of water able to be removed from balloon and unable to remove cathter from patient. Catheter would meet resistance and patient would yell out in pain prior to removal on each attempt. Tried multiple attempts to ensure balloon deflated, more sterile lubricant, tried waiting and then encouraging patient to relax, tried gently rotating catheter but same results each time. Just readvanced catheter and inflated balloon with 30 cc of water and redressed catheter. Of note fluid on old dressing clear to very light yellow, urine in catheter dark brown. Informed patient's nurse and doctor of attempt and results.

## 2022-12-23 NOTE — Discharge Summary (Signed)
Physician Discharge Summary  [name] ZOX:[id number] DOB: 04-06-1941 DOA: 12/19/2022  PCP: Rosetta Posner, MD  Admit date: 12/19/2022 Discharge date: 12/23/2022  [facility] From[facility]  Recommendations for Outpatient Follow-up:  Follow up with PCP in 1-2 weeks F/u w/ urology in 1-2 weeks    Home Health: no  Equipment/Devices:  Discharge Condition: stable  CODE STATUS: full  Diet recommendation: Heart Healthy   Brief/Interim Summary: HPI was taken from Dr. Alvester Morin: [name] is a 82 y.o. 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  Limited history in the setting of baseline dementia.  Per report, patient with worsening abdominal pain.  Positive generalized confusion.  Was reported to have a Tmax of 101.5 at facility with the patient being given Tylenol prior to arrival.  Patient denies any chest pain, shortness of breath.  Mild nausea.  Worsening abdominal pain as well as painful urine flow.  Noted baseline chronic indwelling Foley as well as ostomy in place.  No reported change in ostomy output.  No shortness of breath.  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 Presented to the ER afebrile, hemodynamically stable.  Initial blood pressures 80s over 40s-improved to 100s over 80s status post 2 to 3 L fluid bolus.  White count 4.5, hemoglobin 10.1, urinalysis indicative of infection.  Creatinine 2.62, COVID flu and RSV negative.  Chest x-ray stable.  Knee plain films with nonacute medial  femoral condyle fracture.  CT abdomen pelvis with unchanged mild left hydronephrosis and hydroureter with new mild adjacent fat stranding as well as bladder wall thickening concerning for cystitis.  Noted persistent proctitis and improved prior consolidation from 09/28/2022 imaging.  As per Dr. Mayford Knife 5/15-5/16/24: Pt was found to have severe sepsis likely secondary to acute complicated UTI w/ underlying suprapubic catheter. Urine cx showed multiple species & blood cxs NGTD. Pt was treated w/ IV cefepime while inpatient and d/c on po cefdinir to complete the course.    Discharge Diagnoses:  Principal Problem:   Severe sepsis (HCC) Active Problems:   AKI (acute kidney injury) (HCC)   Pyelonephritis   Acute metabolic encephalopathy   Chronic diastolic CHF (congestive heart failure) (HCC)   COPD (chronic obstructive pulmonary disease) (HCC)   Seizure disorder (HCC)   Benign essential hypertension   Gastroesophageal reflux disease   Decubitus ulcer of right buttock, stage 2 (HCC)  Severe sepsis: secondary to acute complicated UTI, underlying suprapubic catheter. Blood cxs NGTD.  Urine culture showing multiple species.  Continue IV cefepime while inpatient & d/c back to [facility] w/ cefdinir to complete the course. Severe sepsis resolved    AKI on CKDII: Cr is back at baseline   Hypotension: resolved.   Acute metabolic encephalopathy: resolved   Seizure disorder: continue on home dose of phenobarbital   Chronic diastolic CHF:  appears compensated. Monitor I/Os. Restart lasix at d/c    COPD: w/o exacerbation. Continue on bronchodilators    BPH: with suprapubic catheter in place. CT showed unchanged mild left hydronephrosis and hydroureter, new mild adjacent fat stranding concerning for ascending UTI. No plan for urostomy for now.  F/u outpatient w/  urology    Hx of pulmonary embolism: in September 2023. Continue on eliquis    Stage II decubitus ulcer on right buttock: present on admission.  Continue w/ wound care    Debility: PT/OT recs long term care w/o therapy f/u   Obesity: BMI 32.1. Would benefit from weight loss   Discharge Instructions  Discharge Instructions     Diet - low sodium heart healthy   Complete by: As directed    Discharge instructions   Complete by: As directed    F/u w/ urology in 1-2 weeks. F/u w/ PCP in 1-2 weeks   Increase activity slowly   Complete by: As directed    No wound care   Complete by: As directed       Allergies as of 12/23/2022       Reactions   Penicillins Other (See Comments)   Unknown reaction Convulsions    Patient tolerated meropenem for several days at his facility prior to hospital arrival on 11/01/22   Penicillin G Rash, Other (See Comments)   Convulsions.    Has patient had a PCN reaction causing immediate rash, facial/tongue/throat swelling, SOB or lightheadedness with hypotension: not sure Has patient had a PCN reaction causing severe rash involving mucus membranes or skin necrosis: not sure Has patient had a PCN reaction that required hospitalization: not sure Has patient had a PCN reaction occurring within the last 10 years: not sure If all of the above answers are "NO", then may proceed with Cephalosporin use.        Medication List     STOP taking these medications    Dextromethorphan HBr 15 MG Caps   folic acid 1 MG tablet Commonly known as: FOLVITE       TAKE these medications    albuterol 108 (90 Base) MCG/ACT inhaler Commonly known as: VENTOLIN HFA Inhale 2 puffs into the lungs every 6 (six) hours as needed for wheezing or shortness of breath.   ammonium lactate 12 % cream Commonly known as: AMLACTIN Apply 1 Application topically every 12 (twelve) hours.   ARIPiprazole 20 MG tablet Commonly known as: ABILIFY Take 20 mg by mouth in the morning.   ascorbic acid 500 MG tablet Commonly known as: VITAMIN C Take 1 tablet (500 mg total) by mouth 2 (two) times daily.   buPROPion 300 MG 24  hr tablet Commonly known as: Budeprion XL Take 1 tablet (300 mg total) by mouth daily.   carvedilol 25 MG tablet Commonly known as: COREG Take 25 mg by mouth 2 (two) times daily.   cefdinir 300 MG capsule Commonly known as: OMNICEF Take 1 capsule (300 mg total) by mouth 2 (two) times daily for 4 days.   cetirizine 10 MG tablet Commonly known as: ZYRTEC Take 10 mg by mouth in the morning.   cholecalciferol 25 MCG (1000 UNIT) tablet Commonly known as: VITAMIN D3 Take 1,000 Units by mouth in the morning.   Cranberry 250 MG Caps Take 250 mg by mouth 2 (two) times daily.   Eliquis 2.5 MG Tabs tablet Generic drug: apixaban Take 2.5 mg by mouth 2 (two) times daily.   ferrous sulfate 325 (65 FE) MG tablet Take 325 mg by mouth daily with breakfast.   finasteride 5 MG tablet Commonly known as: PROSCAR Take 1 tablet (5 mg total) by mouth daily.   fluticasone furoate-vilanterol 100-25 MCG/ACT Aepb Commonly known as: BREO ELLIPTA Inhale 1 puff into the lungs daily.   furosemide 20 MG tablet Commonly  known as: LASIX Take 1 tablet (20 mg total) by mouth 2 (two) times daily.   guaiFENesin 100 MG/5ML liquid Commonly known as: ROBITUSSIN Take 5 mLs by mouth every 4 (four) hours as needed for cough or to loosen phlegm.   HYDROcodone-acetaminophen 5-325 MG tablet Commonly known as: NORCO/VICODIN Take 1-2 tablets by mouth every 4 (four) hours as needed for up to 1 day for moderate pain or severe pain. What changed: reasons to take this   lactulose 10 GM/15ML solution Commonly known as: CHRONULAC Take 15 mLs (10 g total) by mouth every 6 (six) hours as needed for mild constipation or moderate constipation.   Multi-Vitamins Tabs Take 1 tablet by mouth daily.   nystatin powder Commonly known as: MYCOSTATIN/NYSTOP Apply 1 Application topically 2 (two) times daily.   PHENobarbital 64.8 MG tablet Commonly known as: LUMINAL Take 64.8 mg by mouth daily.   polyethylene glycol 17 g  packet Commonly known as: MIRALAX / GLYCOLAX Take 17 g by mouth daily as needed.   potassium chloride SA 20 MEQ tablet Commonly known as: KLOR-CON M Take 1 tablet (20 mEq total) by mouth 2 (two) times daily.   pravastatin 10 MG tablet Commonly known as: PRAVACHOL Take 10 mg by mouth at bedtime.   Suvorexant 5 MG Tabs Take 5 mg by mouth at bedtime.   umeclidinium bromide 62.5 MCG/ACT Aepb Commonly known as: INCRUSE ELLIPTA Inhale 1 puff into the lungs daily.   zinc oxide 20 % ointment Apply 1 application. topically 2 (two) times daily. (Apply to legs)        Allergies  Allergen Reactions   Penicillins Other (See Comments)    Unknown reaction  Convulsions    Patient tolerated meropenem for several days at his facility prior to hospital arrival on 11/01/22   Penicillin G Rash and Other (See Comments)    Convulsions.    Has patient had a PCN reaction causing immediate rash, facial/tongue/throat swelling, SOB or lightheadedness with hypotension: not sure Has patient had a PCN reaction causing severe rash involving mucus membranes or skin necrosis: not sure Has patient had a PCN reaction that required hospitalization: not sure Has patient had a PCN reaction occurring within the last 10 years: not sure If all of the above answers are "NO", then may proceed with Cephalosporin use.    Consultations:    Procedures/Studies: CT ABDOMEN PELVIS WO CONTRAST  Result Date: 12/19/2022 CLINICAL DATA:  Abdominal pain. Patient was brought in for altered mental status, fever, chunky urine in suprapubic catheter, leaking colostomy, and bedsores. EXAM: CT ABDOMEN AND PELVIS WITHOUT CONTRAST TECHNIQUE: Multidetector CT imaging of the abdomen and pelvis was performed following the standard protocol without IV contrast. RADIATION DOSE REDUCTION: This exam was performed according to the departmental dose-optimization program which includes automated exposure control, adjustment of the mA and/or  kV according to patient size and/or use of iterative reconstruction technique. COMPARISON:  CT abdomen pelvis 09/12/2022, 05/08/2022 FINDINGS: Of note, patient respiratory motion degrades image quality. Lower chest: Near-complete resolution of the peribronchovascular consolidation seen in the right lung base on 09/12/2022 with a few small residual nodular consolidations on today's exam. Visualized left lung is clear. Coronary artery calcification. Mitral and aortic valve calcification. Hepatobiliary: No focal liver abnormality is seen. No gallstones, gallbladder wall thickening, or biliary dilatation. Pancreas: Unremarkable. No pancreatic ductal dilatation or surrounding inflammatory changes. Spleen: Normal in size without focal abnormality. Adrenals/Urinary Tract: Adrenal glands are unremarkable. Atrophic kidneys. 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 Stone burden is unchanged compared to prior exams. No right hydronephrosis. Persistent mild left hydronephrosis and hydroureter with mild adjacent fat stranding, particularly at the level of the proximal ureter and renal pelvis. A small amount of air is seen within the left renal collecting system, likely related to instrumentation/suprapubic catheter. Suprapubic catheter with the tip at the left UVJ. Small amount of non dependent air is noted within the urinary bladder. Diffuse bladder wall thickening is again noted, though is improved compared to 09/12/2022. Similarly, adjacent fat stranding seen on prior exam has also markedly improved. Stomach/Bowel: Stomach is within normal limits. Appendix appears normal. 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 There is persistent diffuse wall thickening throughout the rectum and perirectal fat stranding. Vascular/Lymphatic: Aortic atherosclerosis. No enlarged abdominal or pelvic lymph nodes. Reproductive: Prostate is unremarkable.  Calcifications noted within the vas deferens bilaterally. Other: Diffuse anasarca.  No abdominopelvic ascites. Musculoskeletal: No acute or suspicious osseous findings. Multilevel degenerative disc disease throughout the visualized distal thoracic and lumbar spine. IMPRESSION: 1. Unchanged mild left hydronephrosis and hydroureter with new mild adjacent fat stranding. Findings are concerning for possible ascending urinary tract infection. Recommend correlation with urinalysis. 2. Suprapubic catheter with the tip at the left UVJ. Small amount of air is seen within the urinary bladder and left renal collecting system, likely related to instrumentation/suprapubic catheter. There is persistent diffuse bladder wall thickening, though improved compared to 09/12/2022. 3. 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 4. Near-complete resolution of the peribronchovascular consolidation seen in the right lung base on 09/12/2022. 5. Diffuse anasarca. 6. Aortic atherosclerosis. Aortic Atherosclerosis (ICD10-I70.0). Electronically Signed   By: Sherron Ales M.D.   On: 12/19/2022 16:38   DG Knee 2 Views Right  Result Date: 12/19/2022 CLINICAL DATA:  Altered mental status.  Pain and fever. EXAM: RIGHT KNEE - 1-2 VIEW COMPARISON:  Tib fib study from 11/18/2016 FINDINGS: Fracture involving the nonweightbearing medial cortex of the medial femoral condyle is new in the interval but appears nonacute with adjacent bony callus evident. Similar cortical irregularity along the lateral nonweightbearing cortex of the lateral femoral condyle. Subtle lucency along the growth plate of the proximal tibial metaphysis is stable. No evidence for joint effusion. Bones are demineralized. IMPRESSION: Fracture involving the nonweightbearing medial cortex of the medial femoral condyle is new in the interval but appears nonacute with adjacent bony callus evident. Diffuse bony demineralization without acute fracture or joint effusion. Electronically  Signed   By: Kennith Center M.D.   On: 12/19/2022 15:36   DG Chest Port 1 View  Result Date: 12/19/2022 CLINICAL DATA:  Questionable sepsis. Acute mental status change. Fever. EXAM: PORTABLE CHEST 1 VIEW COMPARISON:  Chest x-ray May 06, 2022 FINDINGS: The heart size and mediastinal contours are within normal limits. Both lungs are clear. The visualized skeletal structures are unremarkable. IMPRESSION: No active disease. Electronically Signed   By: Gerome Sam III M.D.   On: 12/19/2022 15:34   (Echo, Carotid, EGD, Colonoscopy, ERCP)    Subjective: Pt c/o malaise    Discharge Exam: Vitals:   12/23/22 0514 12/23/22 0700  BP: 134/74 128/72  Pulse: 79 81  Resp: 20 18  Temp: 98.3 F (36.8 C) 98.1 F (36.7 C)  SpO2: 96% 96%   Vitals:   12/22/22 0700 12/22/22 1950 12/23/22 0514 12/23/22 0700  BP: 128/75 (!) 157/92 134/74 128/72  Pulse: 82 93 79 81  Resp: 18 18 20 18   Temp: 98 F (36.7 C) 98.1  F (36.7 C) 98.3 F (36.8 C) 98.1 F (36.7 C)  TempSrc: Oral Oral Oral Oral  SpO2: 100% 100% 96% 96%  Weight:        General: Pt is alert, awake, not in acute distress Cardiovascular: S1/S2 +, no rubs, no gallops Respiratory: CTA bilaterally, no wheezing, no rhonchi Abdominal: Soft, NT, obese, bowel sounds + Extremities: no cyanosis    The results of significant diagnostics from this hospitalization (including imaging, microbiology, ancillary and laboratory) are listed below for reference.     Microbiology: Recent Results (from the past 240 hour(s))  Resp panel by RT-PCR (RSV, Flu A&B, Covid) Anterior Nasal Swab     Status: None   Collection Time: 12/19/22  2:20 PM   Specimen: Anterior Nasal Swab  Result Value Ref Range Status   SARS Coronavirus 2 by RT PCR NEGATIVE NEGATIVE Final    Comment: (NOTE) SARS-CoV-2 target nucleic acids are NOT DETECTED.  The SARS-CoV-2 RNA is generally detectable in upper respiratory specimens during the acute phase of infection. The  lowest concentration of SARS-CoV-2 viral copies this assay can detect is 138 copies/mL. A negative result does not preclude SARS-Cov-2 infection and should not be used as the sole basis for treatment or other patient management decisions. A negative result may occur with  improper specimen collection/handling, submission of specimen other than nasopharyngeal swab, presence of viral mutation(s) within the areas targeted by this assay, and inadequate number of viral copies(<138 copies/mL). A negative result must be combined with clinical observations, patient history, and epidemiological information. The expected result is Negative.  Fact Sheet for Patients:  BloggerCourse.com  Fact Sheet for Healthcare Providers:  SeriousBroker.it  This test is no t yet approved or cleared by the [place] FDA and  has been authorized for detection and/or diagnosis of SARS-CoV-2 by FDA under an Emergency Use Authorization (EUA). This EUA will remain  in effect (meaning this test can be used) for the duration of the COVID-19 declaration under Section 564(b)(1) of the Act, 21 U.S.C.section 360bbb-3(b)(1), unless the authorization is terminated  or revoked sooner.       Influenza A by PCR NEGATIVE NEGATIVE Final   Influenza B by PCR NEGATIVE NEGATIVE Final    Comment: (NOTE) The Xpert Xpress SARS-CoV-2/FLU/RSV plus assay is intended as an aid in the diagnosis of influenza from Nasopharyngeal swab specimens and should not be used as a sole basis for treatment. Nasal washings and aspirates are unacceptable for Xpert Xpress SARS-CoV-2/FLU/RSV testing.  Fact Sheet for Patients: BloggerCourse.com  Fact Sheet for Healthcare Providers: SeriousBroker.it  This test is not yet approved or cleared by the [place] FDA and has been authorized for detection and/or diagnosis of SARS-CoV-2 by FDA under  an Emergency Use Authorization (EUA). This EUA will remain in effect (meaning this test can be used) for the duration of the COVID-19 declaration under Section 564(b)(1) of the Act, 21 U.S.C. section 360bbb-3(b)(1), unless the authorization is terminated or revoked.     Resp Syncytial Virus by PCR NEGATIVE NEGATIVE Final    Comment: (NOTE) Fact Sheet for Patients: BloggerCourse.com  Fact Sheet for Healthcare Providers: SeriousBroker.it  This test is not yet approved or cleared by the [place] FDA and has been authorized for detection and/or diagnosis of SARS-CoV-2 by FDA under an Emergency Use Authorization (EUA). This EUA will remain in effect (meaning this test can be used) for the duration of the COVID-19 declaration under Section 564(b)(1) of the Act, 21 U.S.C. section 360bbb-3(b)(1), unless the  authorization is terminated or revoked.  Performed at [facility], [address] [place], [place] [address]   Blood Culture (routine x 2)     Status: None (Preliminary result)   Collection Time: 12/19/22  2:20 PM   Specimen: BLOOD  Result Value Ref Range Status   Specimen Description BLOOD BLOOD RIGHT HAND  Final   Special Requests   Final    BOTTLES DRAWN AEROBIC AND ANAEROBIC Blood Culture adequate volume   Culture   Final    NO GROWTH 4 DAYS Performed at [facility], [address] [name], [place] [address]    Report Status PENDING  Incomplete  Urine Culture     Status: Abnormal   Collection Time: 12/19/22  4:15 PM   Specimen: Urine, Random  Result Value Ref Range Status   Specimen Description   Final    URINE, RANDOM Performed at [facility], [address] [place], [place] [address]    Special Requests   Final    NONE Reflexed from [id number] Performed at [facility], [address] [place], [place] [address]    Culture MULTIPLE SPECIES PRESENT, SUGGEST RECOLLECTION  (A)  Final   Report Status 12/22/2022 FINAL  Final  Culture, blood (Routine X 2) w Reflex to ID Panel     Status: None (Preliminary result)   Collection Time: 12/20/22  8:31 AM   Specimen: BLOOD  Result Value Ref Range Status   Specimen Description BLOOD RIGHT ANTECUBITAL  Final   Special Requests   Final    BOTTLES DRAWN AEROBIC AND ANAEROBIC Blood Culture adequate volume   Culture   Final    NO GROWTH 3 DAYS Performed at [facility], [address] [place], [place] [address]    Report Status PENDING  Incomplete     Labs: BNP (last 3 results) Recent Labs    01/10/22 0934 05/02/22 2121 05/07/22 0821  BNP 530.0* 318.6* 74.0   Basic Metabolic Panel: Recent Labs  Lab 12/19/22 1422 12/20/22 0949 12/21/22 0526 12/22/22 0404 12/23/22 0959  NA 139 139 141 141 140  K 4.6 3.9 3.6 3.7 3.3*  CL 109 109 113* 112* 112*  CO2 22 23 24 23 22   GLUCOSE 91 120* 95 87 93  BUN 41* 42* 39* 34* 24*  CREATININE 2.62* 2.25* 1.61* 1.25* 0.96  CALCIUM 8.1* 7.9* 7.9* 8.1* 8.2*   Liver Function Tests: Recent Labs  Lab 12/19/22 1422 12/20/22 0949  AST 12* 13*  ALT 10 13  ALKPHOS 65 60  BILITOT 0.5 0.6  PROT 5.7* 5.5*  ALBUMIN 2.4* 2.3*   No results for input(s): "LIPASE", "AMYLASE" in the last 168 hours. No results for input(s): "AMMONIA" in the last 168 hours. CBC: Recent Labs  Lab 12/19/22 1422 12/20/22 0949 12/21/22 0526 12/22/22 0404 12/23/22 0959  WBC 4.5 4.4 4.4 5.3 5.5  NEUTROABS 3.0  --  2.2  --   --   HGB 10.1* 9.0* 8.4* 8.3* 9.1*  HCT 33.4* 28.9* 26.8* 25.8* 29.2*  MCV 94.1 91.7 89.6 88.7 89.8  PLT 158 146* 152 160 183   Cardiac Enzymes: No results for input(s): "CKTOTAL", "CKMB", "CKMBINDEX", "TROPONINI" in the last 168 hours. BNP: Invalid input(s): "POCBNP" CBG: No results for input(s): "GLUCAP" in the last 168 hours. D-Dimer No results for input(s): "DDIMER" in the last 72 hours. Hgb A1c No results for input(s): "HGBA1C" in the last 72  hours. Lipid Profile No results for input(s): "CHOL", "HDL", "LDLCALC", "TRIG", "  CHOLHDL", "LDLDIRECT" in the last 72 hours. Thyroid function studies No results for input(s): "TSH", "T4TOTAL", "T3FREE", "THYROIDAB" in the last 72 hours.  Invalid input(s): "FREET3" Anemia work up No results for input(s): "VITAMINB12", "FOLATE", "FERRITIN", "TIBC", "IRON", "RETICCTPCT" in the last 72 hours. Urinalysis    Component Value Date/Time   COLORURINE AMBER (A) 12/19/2022 1615   APPEARANCEUR CLOUDY (A) 12/19/2022 1615   APPEARANCEUR Hazy 10/02/2012 0915   LABSPEC 1.015 12/19/2022 1615   LABSPEC 1.027 10/02/2012 0915   PHURINE 6.0 12/19/2022 1615   GLUCOSEU NEGATIVE 12/19/2022 1615   GLUCOSEU Negative 10/02/2012 0915   HGBUR LARGE (A) 12/19/2022 1615   BILIRUBINUR NEGATIVE 12/19/2022 1615   BILIRUBINUR Negative 10/02/2012 0915   KETONESUR NEGATIVE 12/19/2022 1615   PROTEINUR 100 (A) 12/19/2022 1615   NITRITE POSITIVE (A) 12/19/2022 1615   LEUKOCYTESUR LARGE (A) 12/19/2022 1615   LEUKOCYTESUR 1+ 10/02/2012 0915   Sepsis Labs Recent Labs  Lab 12/20/22 0949 12/21/22 0526 12/22/22 0404 12/23/22 0959  WBC 4.4 4.4 5.3 5.5   Microbiology Recent Results (from the past 240 hour(s))  Resp panel by RT-PCR (RSV, Flu A&B, Covid) Anterior Nasal Swab     Status: None   Collection Time: 12/19/22  2:20 PM   Specimen: Anterior Nasal Swab  Result Value Ref Range Status   SARS Coronavirus 2 by RT PCR NEGATIVE NEGATIVE Final    Comment: (NOTE) SARS-CoV-2 target nucleic acids are NOT DETECTED.  The SARS-CoV-2 RNA is generally detectable in upper respiratory specimens during the acute phase of infection. The lowest concentration of SARS-CoV-2 viral copies this assay can detect is 138 copies/mL. A negative result does not preclude SARS-Cov-2 infection and should not be used as the sole basis for treatment or other patient management decisions. A negative result may occur with  improper specimen  collection/handling, submission of specimen other than nasopharyngeal swab, presence of viral mutation(s) within the areas targeted by this assay, and inadequate number of viral copies(<138 copies/mL). A negative result must be combined with clinical observations, patient history, and epidemiological information. The expected result is Negative.  Fact Sheet for Patients:  BloggerCourse.com  Fact Sheet for Healthcare Providers:  SeriousBroker.it  This test is no t yet approved or cleared by the [place] FDA and  has been authorized for detection and/or diagnosis of SARS-CoV-2 by FDA under an Emergency Use Authorization (EUA). This EUA will remain  in effect (meaning this test can be used) for the duration of the COVID-19 declaration under Section 564(b)(1) of the Act, 21 U.S.C.section 360bbb-3(b)(1), unless the authorization is terminated  or revoked sooner.       Influenza A by PCR NEGATIVE NEGATIVE Final   Influenza B by PCR NEGATIVE NEGATIVE Final    Comment: (NOTE) The Xpert Xpress SARS-CoV-2/FLU/RSV plus assay is intended as an aid in the diagnosis of influenza from Nasopharyngeal swab specimens and should not be used as a sole basis for treatment. Nasal washings and aspirates are unacceptable for Xpert Xpress SARS-CoV-2/FLU/RSV testing.  Fact Sheet for Patients: BloggerCourse.com  Fact Sheet for Healthcare Providers: SeriousBroker.it  This test is not yet approved or cleared by the [place] FDA and has been authorized for detection and/or diagnosis of SARS-CoV-2 by FDA under an Emergency Use Authorization (EUA). This EUA will remain in effect (meaning this test can be used) for the duration of the COVID-19 declaration under Section 564(b)(1) of the Act, 21 U.S.C. section 360bbb-3(b)(1), unless the authorization is terminated or revoked.     Resp Syncytial  Virus by PCR NEGATIVE NEGATIVE Final    Comment: (NOTE) Fact Sheet for Patients: BloggerCourse.com  Fact Sheet for Healthcare Providers: SeriousBroker.it  This test is not yet approved or cleared by the [place] FDA and has been authorized for detection and/or diagnosis of SARS-CoV-2 by FDA under an Emergency Use Authorization (EUA). This EUA will remain in effect (meaning this test can be used) for the duration of the COVID-19 declaration under Section 564(b)(1) of the Act, 21 U.S.C. section 360bbb-3(b)(1), unless the authorization is terminated or revoked.  Performed at [facility], [address] [place], [place] [address]   Blood Culture (routine x 2)     Status: None (Preliminary result)   Collection Time: 12/19/22  2:20 PM   Specimen: BLOOD  Result Value Ref Range Status   Specimen Description BLOOD BLOOD RIGHT HAND  Final   Special Requests   Final    BOTTLES DRAWN AEROBIC AND ANAEROBIC Blood Culture adequate volume   Culture   Final    NO GROWTH 4 DAYS Performed at [facility], [address] [place], [place] [address]    Report Status PENDING  Incomplete  Urine Culture     Status: Abnormal   Collection Time: 12/19/22  4:15 PM   Specimen: Urine, Random  Result Value Ref Range Status   Specimen Description   Final    URINE, RANDOM Performed at [facility], [address], [place], [place] [address]    Special Requests   Final    NONE Reflexed from [id number] Performed at [facility], [address] [place], [place] [address]    Culture MULTIPLE SPECIES PRESENT, SUGGEST RECOLLECTION (A)  Final   Report Status 12/22/2022 FINAL  Final  Culture, blood (Routine X 2) w Reflex to ID Panel     Status: None (Preliminary result)   Collection Time: 12/20/22  8:31 AM   Specimen: BLOOD  Result Value Ref Range Status   Specimen Description BLOOD RIGHT ANTECUBITAL  Final    Special Requests   Final    BOTTLES DRAWN AEROBIC AND ANAEROBIC Blood Culture adequate volume   Culture   Final    NO GROWTH 3 DAYS Performed at [facility], [address] [place], [place] [address]    Report Status PENDING  Incomplete     Time coordinating discharge: Over 30 minutes  SIGNED:   Charise Killian, MD  [facility] 12/23/2022, 11:52 AM Pager   If 7PM-7AM, please contact night-coverage [email or web address]

## 2022-12-23 NOTE — TOC Transition Note (Signed)
Transition of Care [facility]) - CM/SW Discharge Note   Patient Details  Name: [name] MRN: [id number] Date of Birth: [birth year]  Transition of Care [facility]) CM/SW Contact:  Allena Katz, LCSW Phone Number: 12/23/2022, 1:22 PM   Clinical Narrative:   Pt discharging to [facility]. Tammy notified. RN given number for report. Dc summary and fl2 sent to facility. Medical necessity printed to unit.     Final next level of care: Skilled Nursing Facility Barriers to Discharge: Barriers Resolved   Patient Goals and CMS Choice      Discharge Placement                Patient chooses bed at: [facility] Patient to be transferred to facility by: [facility]      Discharge Plan and Services Additional resources added to the After Visit Summary for                                       Social Determinants of Health (SDOH) Interventions SDOH Screenings   Food Insecurity: No Food Insecurity (12/20/2022)  Housing: Low Risk  (12/20/2022)  Transportation Needs: No Transportation Needs (12/20/2022)  Utilities: Not At Risk (12/20/2022)  Tobacco Use: Low Risk  (12/20/2022)     Readmission Risk Interventions    12/22/2022   10:08 AM 05/04/2022   12:15 PM  Readmission Risk Prevention Plan  Transportation Screening Complete Complete  Medication Review Oceanographer) Complete Complete  PCP or Specialist appointment within 3-5 days of discharge  Complete  HRI or Home Care Consult Complete   SW Recovery Care/Counseling Consult  Complete  Palliative Care Screening  Not Applicable  Skilled Nursing Facility Complete Complete

## 2022-12-23 NOTE — Plan of Care (Signed)
Report called to RN at [facility]. IV removed intact. VSS. Suprapubic in place. Ostomy patent. Education placed in packet.    Problem: Education: Goal: Knowledge of General Education information will improve Description: Including pain rating scale, medication(s)/side effects and non-pharmacologic comfort measures Outcome: Progressing   Problem: Health Behavior/Discharge Planning: Goal: Ability to manage health-related needs will improve Outcome: Progressing   Problem: Clinical Measurements: Goal: Ability to maintain clinical measurements within normal limits will improve Outcome: Progressing Goal: Will remain free from infection Outcome: Progressing Goal: Diagnostic test results will improve Outcome: Progressing Goal: Respiratory complications will improve Outcome: Progressing Goal: Cardiovascular complication will be avoided Outcome: Progressing   Problem: Activity: Goal: Risk for activity intolerance will decrease Outcome: Progressing   Problem: Nutrition: Goal: Adequate nutrition will be maintained Outcome: Progressing   Problem: Coping: Goal: Level of anxiety will decrease Outcome: Progressing   Problem: Elimination: Goal: Will not experience complications related to bowel motility Outcome: Progressing Goal: Will not experience complications related to urinary retention Outcome: Progressing   Problem: Pain Managment: Goal: General experience of comfort will improve Outcome: Progressing   Problem: Safety: Goal: Ability to remain free from injury will improve Outcome: Progressing   Problem: Skin Integrity: Goal: Risk for impaired skin integrity will decrease Outcome: Progressing

## 2022-12-23 NOTE — TOC Transition Note (Signed)
Transition of Care [facility]) - CM/SW Discharge Note   Patient Details  Name: [name] MRN: [id number] Date of Birth: [birth year]  Transition of Care [facility]) CM/SW Contact:  Allena Katz, LCSW Phone Number: 12/23/2022, 9:16 AM   Clinical Narrative:   Pt to [facility] back [facility]. RN given number for report. Medical necessity printed to unit.CSW to send DC summary once complete. Tammy with [facility] notified.    Final next level of care: Skilled Nursing Facility Barriers to Discharge: Barriers Resolved   Patient Goals and CMS Choice      Discharge Placement                Patient chooses bed at: [name] Patient to be transferred to facility by: [facility]      Discharge Plan and Services Additional resources added to the After Visit Summary for                                       Social Determinants of Health (SDOH) Interventions SDOH Screenings   Food Insecurity: No Food Insecurity (12/20/2022)  Housing: Low Risk  (12/20/2022)  Transportation Needs: No Transportation Needs (12/20/2022)  Utilities: Not At Risk (12/20/2022)  Tobacco Use: Low Risk  (12/20/2022)     Readmission Risk Interventions    12/22/2022   10:08 AM 05/04/2022   12:15 PM  Readmission Risk Prevention Plan  Transportation Screening Complete Complete  Medication Review Oceanographer) Complete Complete  PCP or Specialist appointment within 3-5 days of discharge  Complete  HRI or Home Care Consult Complete   SW Recovery Care/Counseling Consult  Complete  Palliative Care Screening  Not Applicable  Skilled Nursing Facility Complete Complete

## 2022-12-23 NOTE — Care Management Important Message (Signed)
Important Message  Patient Details  Name: [name] MRN: [id number] Date of Birth: 07-31-1941   Medicare Important Message Given:  Yes  Patient is in an isolation room so I reviewed his Important Message from Medicare with him by phone [phone]) and he was in agreement with his discharge. I wished him a speedy recovery and thanked him for his time.   Olegario Messier A Torianna Junio 12/23/2022, 1:16 PM

## 2022-12-25 LAB — CULTURE, BLOOD (ROUTINE X 2): Culture: NO GROWTH

## 2023-02-17 ENCOUNTER — Other Ambulatory Visit: Payer: Self-pay

## 2023-02-17 ENCOUNTER — Emergency Department: Payer: Medicare Other

## 2023-02-17 ENCOUNTER — Emergency Department
Admission: EM | Admit: 2023-02-17 | Discharge: 2023-02-17 | Disposition: A | Discharge status: Home / Self Care | Payer: Medicare Other | Attending: Emergency Medicine | Admitting: Emergency Medicine

## 2023-02-17 DIAGNOSIS — J449 Chronic obstructive pulmonary disease, unspecified: Secondary | ICD-10-CM | POA: Diagnosis not present

## 2023-02-17 DIAGNOSIS — N3001 Acute cystitis with hematuria: Secondary | ICD-10-CM | POA: Insufficient documentation

## 2023-02-17 DIAGNOSIS — I509 Heart failure, unspecified: Secondary | ICD-10-CM | POA: Diagnosis not present

## 2023-02-17 DIAGNOSIS — N189 Chronic kidney disease, unspecified: Secondary | ICD-10-CM | POA: Insufficient documentation

## 2023-02-17 DIAGNOSIS — I130 Hypertensive heart and chronic kidney disease with heart failure and stage 1 through stage 4 chronic kidney disease, or unspecified chronic kidney disease: Secondary | ICD-10-CM | POA: Diagnosis not present

## 2023-02-17 DIAGNOSIS — W19XXXA Unspecified fall, initial encounter: Secondary | ICD-10-CM

## 2023-02-17 DIAGNOSIS — Z7901 Long term (current) use of anticoagulants: Secondary | ICD-10-CM | POA: Insufficient documentation

## 2023-02-17 DIAGNOSIS — W07XXXA Fall from chair, initial encounter: Secondary | ICD-10-CM | POA: Diagnosis not present

## 2023-02-17 DIAGNOSIS — S0083XA Contusion of other part of head, initial encounter: Secondary | ICD-10-CM | POA: Diagnosis present

## 2023-02-17 LAB — BASIC METABOLIC PANEL
Anion gap: 8 (ref 5–15)
BUN: 24 mg/dL — ABNORMAL HIGH (ref 8–23)
CO2: 27 mmol/L (ref 22–32)
Calcium: 8.8 mg/dL — ABNORMAL LOW (ref 8.9–10.3)
Chloride: 106 mmol/L (ref 98–111)
Creatinine, Ser: 1.32 mg/dL — ABNORMAL HIGH (ref 0.61–1.24)
GFR, Estimated: 55 mL/min — ABNORMAL LOW (ref >60)
Glucose, Bld: 96 mg/dL (ref 70–99)
Potassium: 4.5 mmol/L (ref 3.5–5.1)
Sodium: 141 mmol/L (ref 135–145)

## 2023-02-17 LAB — HEPATIC FUNCTION PANEL
ALT: 15 U/L (ref 0–44)
AST: 21 U/L (ref 15–41)
Albumin: 3.1 g/dL — ABNORMAL LOW (ref 3.5–5.0)
Alkaline Phosphatase: 86 U/L (ref 38–126)
Bilirubin, Direct: <0.1 mg/dL (ref 0.0–0.2)
Total Bilirubin: 0.6 mg/dL (ref 0.3–1.2)
Total Protein: 7.4 g/dL (ref 6.5–8.1)

## 2023-02-17 LAB — URINALYSIS, ROUTINE W REFLEX MICROSCOPIC
Bilirubin Urine: NEGATIVE
Glucose, UA: NEGATIVE mg/dL
Ketones, ur: NEGATIVE mg/dL
Nitrite: NEGATIVE
Protein, ur: 100 mg/dL — AB
RBC / HPF: >50 RBC/hpf (ref 0–5)
Specific Gravity, Urine: 1.011 (ref 1.005–1.030)
WBC, UA: >50 WBC/hpf (ref 0–5)
pH: 7 (ref 5.0–8.0)

## 2023-02-17 LAB — CBC
HCT: 31.5 % — ABNORMAL LOW (ref 39.0–52.0)
Hemoglobin: 9.7 g/dL — ABNORMAL LOW (ref 13.0–17.0)
MCH: 27.4 pg (ref 26.0–34.0)
MCHC: 30.8 g/dL (ref 30.0–36.0)
MCV: 89 fL (ref 80.0–100.0)
Platelets: 253 10*3/uL (ref 150–400)
RBC: 3.54 MIL/uL — ABNORMAL LOW (ref 4.22–5.81)
RDW: 15.2 % (ref 11.5–15.5)
WBC: 8.9 10*3/uL (ref 4.0–10.5)
nRBC: 0 % (ref 0.0–0.2)

## 2023-02-17 LAB — LIPASE, BLOOD: Lipase: 34 U/L (ref 11–51)

## 2023-02-17 MED ORDER — CEFTRIAXONE SODIUM 1 G IJ SOLR
1.0000 g | Freq: Once | INTRAMUSCULAR | Status: AC
  Administered 2023-02-17: 1 g via INTRAMUSCULAR
  Filled 2023-02-17: qty 10

## 2023-02-17 MED ORDER — LIDOCAINE HCL (PF) 1 % IJ SOLN: 2.0000 mL | Freq: Once | INTRAMUSCULAR | Status: AC

## 2023-02-17 MED ORDER — CEPHALEXIN 500 MG PO CAPS: 500.0000 mg | ORAL_CAPSULE | Freq: Four times a day (QID) | ORAL | 0 refills | Status: AC

## 2023-02-17 MED ORDER — LIDOCAINE HCL (PF) 1 % IJ SOLN
INTRAMUSCULAR | Status: AC
  Administered 2023-02-17: 2 mL
  Filled 2023-02-17: qty 5

## 2023-02-17 NOTE — ED Triage Notes (Signed)
Pt here via [facility] from [facility] after a fall today. Pt states he fell out of the chair and hit his head. Pt has a contusion to the tight of his forehead. Pt is bed bound.

## 2023-02-17 NOTE — Discharge Instructions (Addendum)
Take Keflex four times daily for the next seven days.  

## 2023-02-17 NOTE — ED Triage Notes (Signed)
First nurse note: Arrived by EMS from [facility]. Found in floor. Contusion to right forehead. Patient is Bed bound.   EMS vitals: 120/70 b/p 70HR 97% RA

## 2023-02-17 NOTE — ED Provider Notes (Signed)
[facility] Provider Note  Patient Contact: 5:55 PM (approximate)   History   Fall   HPI  [name] is a 82 y.o. male with a history of CHF, COPD, hypertension, GERD and chronic kidney disease, presents to the emergency department after patient had a mechanical fall and hit his head.  Patient was sitting in a chair at the time of fall.  Patient currently takes Eliquis for prior DVT.  Patient is typically bedbound and does not ambulate routinely.  No chest pain, chest tightness or abdominal pain.      Physical Exam   Triage Vital Signs: ED Triage Vitals  Encounter Vitals Group     BP 02/17/23 1721 120/67     Systolic BP Percentile --      Diastolic BP Percentile --      Pulse Rate 02/17/23 1721 82     Resp 02/17/23 1721 20     Temp 02/17/23 1721 98.4 F (36.9 C)     Temp Source 02/17/23 1721 Oral     SpO2 02/17/23 1721 99 %     Weight 02/17/23 1722 223 lb 12.3 oz (101.5 kg)     Height 02/17/23 1722 5\' 10"  (1.778 m)     Head Circumference --      [facility] Flow --      Pain Score 02/17/23 1722 10     Pain Loc --      Pain Education --      Exclude from Growth Chart --     Most recent vital signs: Vitals:   02/17/23 1721  BP: 120/67  Pulse: 82  Resp: 20  Temp: 98.4 F (36.9 C)  SpO2: 99%     General: Alert and in no acute distress. Eyes:  PERRL. EOMI Head: No acute traumatic findings ENT:      Nose: No congestion/rhinnorhea.      Mouth/Throat: Mucous membranes are moist.  Neck: No stridor. No cervical spine tenderness to palpation. Cardiovascular:  Good peripheral perfusion Respiratory: Normal respiratory effort without tachypnea or retractions. Lungs CTAB. Good air entry to the bases with no decreased or absent breath sounds. Gastrointestinal: Bowel sounds 4 quadrants. Soft and nontender to palpation. No guarding or rigidity. No palpable masses. No distention. No CVA tenderness. Musculoskeletal: Full range of motion to all  extremities.  Neurologic:  No gross focal neurologic deficits are appreciated.  Skin:   No rash noted    ED Results / Procedures / Treatments   Labs (all labs ordered are listed, but only abnormal results are displayed) Labs Reviewed  CBC - Abnormal; Notable for the following components:      Result Value   RBC 3.54 (*)    Hemoglobin 9.7 (*)    HCT 31.5 (*)    All other components within normal limits  BASIC METABOLIC PANEL - Abnormal; Notable for the following components:   BUN 24 (*)    Creatinine, Ser 1.32 (*)    Calcium 8.8 (*)    GFR, Estimated 55 (*)    All other components within normal limits  HEPATIC FUNCTION PANEL - Abnormal; Notable for the following components:   Albumin 3.1 (*)    All other components within normal limits  URINALYSIS, ROUTINE W REFLEX MICROSCOPIC - Abnormal; Notable for the following components:   Color, Urine YELLOW (*)    APPearance TURBID (*)    Hgb urine dipstick MODERATE (*)    Protein, ur 100 (*)    Leukocytes,Ua LARGE (*)  Bacteria, UA MANY (*)    All other components within normal limits  URINE CULTURE  LIPASE, BLOOD       RADIOLOGY  I personally viewed and evaluated these images as part of my medical decision making, as well as reviewing the written report by the radiologist.  ED Provider Interpretation: CTs of the head and cervical spine show no acute abnormality.  CT of the pelvis unremarkable.   PROCEDURES:  Critical Care performed: No  Procedures   MEDICATIONS ORDERED IN ED: Medications  cefTRIAXone (ROCEPHIN) injection 1 g (1 g Intramuscular Given 02/17/23 1950)  lidocaine (PF) (XYLOCAINE) 1 % injection 2 mL (2 mLs Other Given 02/17/23 1957)     IMPRESSION / MDM / ASSESSMENT AND PLAN / ED COURSE  I reviewed the triage vital signs and the nursing notes.                              Assessment and plan:  UTI Fall 82 year old male presents to the emergency department after patient had a witnessed mechanical  fall from a chair.  Urinalysis indicates a large amount of leukocytes and many bacteria concerning for UTI.  CBC and BMP consistent with baseline labs.  CTs of the head, cervical spine and pelvis reassuring.  Patient was given IM Rocephin in the emergency department and discharged with Keflex.  Return precautions were given to return with new or worsening symptoms.  FINAL CLINICAL IMPRESSION(S) / ED DIAGNOSES   Final diagnoses:  Acute cystitis with hematuria  Fall, initial encounter     Rx / DC Orders   ED Discharge Orders          Ordered    cephALEXin (KEFLEX) 500 MG capsule  4 times daily        02/17/23 2009             Note:  This document was prepared using Dragon voice recognition software and may include unintentional dictation errors.   Pia Mau [place], Cordelia Poche 02/17/23 2029    [name], MD 02/17/23 2249

## 2023-02-17 NOTE — ED Notes (Signed)
Spoke with Inetta Fermo, Diplomatic Services operational officer about transport back to [facility] facility

## 2023-02-19 LAB — URINE CULTURE

## 2023-04-05 ENCOUNTER — Inpatient Hospital Stay
Admission: EM | Admit: 2023-04-05 | Discharge: 2023-04-13 | DRG: 698 | Disposition: A | Discharge status: Skilled Nursing Facility | Payer: Medicare Other | Attending: Internal Medicine | Admitting: Internal Medicine

## 2023-04-05 ENCOUNTER — Emergency Department: Payer: Medicare Other

## 2023-04-05 ENCOUNTER — Other Ambulatory Visit: Payer: Self-pay

## 2023-04-05 ENCOUNTER — Inpatient Hospital Stay: Payer: Medicare Other

## 2023-04-05 DIAGNOSIS — D631 Anemia in chronic kidney disease: Secondary | ICD-10-CM | POA: Diagnosis present

## 2023-04-05 DIAGNOSIS — N400 Enlarged prostate without lower urinary tract symptoms: Secondary | ICD-10-CM | POA: Diagnosis present

## 2023-04-05 DIAGNOSIS — I130 Hypertensive heart and chronic kidney disease with heart failure and stage 1 through stage 4 chronic kidney disease, or unspecified chronic kidney disease: Secondary | ICD-10-CM | POA: Diagnosis present

## 2023-04-05 DIAGNOSIS — F209 Schizophrenia, unspecified: Secondary | ICD-10-CM | POA: Diagnosis present

## 2023-04-05 DIAGNOSIS — J690 Pneumonitis due to inhalation of food and vomit: Secondary | ICD-10-CM | POA: Diagnosis present

## 2023-04-05 DIAGNOSIS — E875 Hyperkalemia: Secondary | ICD-10-CM | POA: Diagnosis not present

## 2023-04-05 DIAGNOSIS — Z7951 Long term (current) use of inhaled steroids: Secondary | ICD-10-CM

## 2023-04-05 DIAGNOSIS — J150 Pneumonia due to Klebsiella pneumoniae: Secondary | ICD-10-CM | POA: Diagnosis present

## 2023-04-05 DIAGNOSIS — I5032 Chronic diastolic (congestive) heart failure: Secondary | ICD-10-CM | POA: Diagnosis present

## 2023-04-05 DIAGNOSIS — L89152 Pressure ulcer of sacral region, stage 2: Secondary | ICD-10-CM | POA: Diagnosis present

## 2023-04-05 DIAGNOSIS — E7800 Pure hypercholesterolemia, unspecified: Secondary | ICD-10-CM | POA: Diagnosis present

## 2023-04-05 DIAGNOSIS — Z515 Encounter for palliative care: Secondary | ICD-10-CM | POA: Diagnosis not present

## 2023-04-05 DIAGNOSIS — A4159 Other Gram-negative sepsis: Secondary | ICD-10-CM | POA: Diagnosis present

## 2023-04-05 DIAGNOSIS — F0153 Vascular dementia, unspecified severity, with mood disturbance: Secondary | ICD-10-CM | POA: Diagnosis present

## 2023-04-05 DIAGNOSIS — Y838 Other surgical procedures as the cause of abnormal reaction of the patient, or of later complication, without mention of misadventure at the time of the procedure: Secondary | ICD-10-CM | POA: Diagnosis present

## 2023-04-05 DIAGNOSIS — Z1612 Extended spectrum beta lactamase (ESBL) resistance: Secondary | ICD-10-CM | POA: Diagnosis present

## 2023-04-05 DIAGNOSIS — J440 Chronic obstructive pulmonary disease with acute lower respiratory infection: Secondary | ICD-10-CM | POA: Diagnosis present

## 2023-04-05 DIAGNOSIS — J449 Chronic obstructive pulmonary disease, unspecified: Secondary | ICD-10-CM | POA: Diagnosis not present

## 2023-04-05 DIAGNOSIS — Z86711 Personal history of pulmonary embolism: Secondary | ICD-10-CM | POA: Diagnosis not present

## 2023-04-05 DIAGNOSIS — I10 Essential (primary) hypertension: Secondary | ICD-10-CM | POA: Diagnosis not present

## 2023-04-05 DIAGNOSIS — R579 Shock, unspecified: Secondary | ICD-10-CM | POA: Diagnosis not present

## 2023-04-05 DIAGNOSIS — G9341 Metabolic encephalopathy: Secondary | ICD-10-CM | POA: Diagnosis present

## 2023-04-05 DIAGNOSIS — G40909 Epilepsy, unspecified, not intractable, without status epilepticus: Secondary | ICD-10-CM | POA: Diagnosis present

## 2023-04-05 DIAGNOSIS — N390 Urinary tract infection, site not specified: Secondary | ICD-10-CM | POA: Diagnosis present

## 2023-04-05 DIAGNOSIS — R6521 Severe sepsis with septic shock: Secondary | ICD-10-CM | POA: Diagnosis present

## 2023-04-05 DIAGNOSIS — F319 Bipolar disorder, unspecified: Secondary | ICD-10-CM | POA: Diagnosis present

## 2023-04-05 DIAGNOSIS — T83510A Infection and inflammatory reaction due to cystostomy catheter, initial encounter: Principal | ICD-10-CM | POA: Diagnosis present

## 2023-04-05 DIAGNOSIS — N179 Acute kidney failure, unspecified: Secondary | ICD-10-CM | POA: Diagnosis present

## 2023-04-05 DIAGNOSIS — I82403 Acute embolism and thrombosis of unspecified deep veins of lower extremity, bilateral: Secondary | ICD-10-CM | POA: Diagnosis present

## 2023-04-05 DIAGNOSIS — Z1624 Resistance to multiple antibiotics: Secondary | ICD-10-CM | POA: Diagnosis present

## 2023-04-05 DIAGNOSIS — A419 Sepsis, unspecified organism: Secondary | ICD-10-CM

## 2023-04-05 DIAGNOSIS — Z79899 Other long term (current) drug therapy: Secondary | ICD-10-CM

## 2023-04-05 DIAGNOSIS — J189 Pneumonia, unspecified organism: Principal | ICD-10-CM

## 2023-04-05 DIAGNOSIS — Z86718 Personal history of other venous thrombosis and embolism: Secondary | ICD-10-CM

## 2023-04-05 DIAGNOSIS — E872 Acidosis, unspecified: Secondary | ICD-10-CM | POA: Diagnosis present

## 2023-04-05 DIAGNOSIS — R0902 Hypoxemia: Secondary | ICD-10-CM | POA: Diagnosis present

## 2023-04-05 DIAGNOSIS — N1831 Chronic kidney disease, stage 3a: Secondary | ICD-10-CM | POA: Diagnosis present

## 2023-04-05 DIAGNOSIS — Z87442 Personal history of urinary calculi: Secondary | ICD-10-CM

## 2023-04-05 DIAGNOSIS — N492 Inflammatory disorders of scrotum: Secondary | ICD-10-CM | POA: Diagnosis present

## 2023-04-05 DIAGNOSIS — R54 Age-related physical debility: Secondary | ICD-10-CM | POA: Diagnosis present

## 2023-04-05 DIAGNOSIS — J9621 Acute and chronic respiratory failure with hypoxia: Secondary | ICD-10-CM | POA: Diagnosis present

## 2023-04-05 DIAGNOSIS — Z880 Allergy status to penicillin: Secondary | ICD-10-CM

## 2023-04-05 DIAGNOSIS — L089 Local infection of the skin and subcutaneous tissue, unspecified: Secondary | ICD-10-CM | POA: Diagnosis present

## 2023-04-05 DIAGNOSIS — Z1152 Encounter for screening for COVID-19: Secondary | ICD-10-CM | POA: Diagnosis not present

## 2023-04-05 DIAGNOSIS — K219 Gastro-esophageal reflux disease without esophagitis: Secondary | ICD-10-CM | POA: Diagnosis present

## 2023-04-05 DIAGNOSIS — Z7901 Long term (current) use of anticoagulants: Secondary | ICD-10-CM

## 2023-04-05 DIAGNOSIS — T83031A Leakage of indwelling urethral catheter, initial encounter: Secondary | ICD-10-CM | POA: Diagnosis not present

## 2023-04-05 LAB — URINALYSIS, W/ REFLEX TO CULTURE (INFECTION SUSPECTED)
Bilirubin Urine: NEGATIVE
Glucose, UA: NEGATIVE mg/dL
Ketones, ur: 5 mg/dL — AB
Nitrite: NEGATIVE
Protein, ur: >=300 mg/dL — AB
RBC / HPF: >50 RBC/hpf (ref 0–5)
Specific Gravity, Urine: 1.015 (ref 1.005–1.030)
Squamous Epithelial / HPF: NONE SEEN /HPF (ref 0–5)
WBC, UA: >50 WBC/hpf (ref 0–5)
pH: 7 (ref 5.0–8.0)

## 2023-04-05 LAB — PROTIME-INR
INR: 1.6 — ABNORMAL HIGH (ref 0.8–1.2)
Prothrombin Time: 19.4 seconds — ABNORMAL HIGH (ref 11.4–15.2)

## 2023-04-05 LAB — BASIC METABOLIC PANEL
Anion gap: 10 (ref 5–15)
BUN: 51 mg/dL — ABNORMAL HIGH (ref 8–23)
CO2: 17 mmol/L — ABNORMAL LOW (ref 22–32)
Calcium: 6.8 mg/dL — ABNORMAL LOW (ref 8.9–10.3)
Chloride: 108 mmol/L (ref 98–111)
Creatinine, Ser: 2.46 mg/dL — ABNORMAL HIGH (ref 0.61–1.24)
GFR, Estimated: 26 mL/min — ABNORMAL LOW (ref >60)
Glucose, Bld: 120 mg/dL — ABNORMAL HIGH (ref 70–99)
Potassium: 4.9 mmol/L (ref 3.5–5.1)
Sodium: 135 mmol/L (ref 135–145)

## 2023-04-05 LAB — RESP PANEL BY RT-PCR (RSV, FLU A&B, COVID)  RVPGX2
Influenza A by PCR: NEGATIVE
Influenza B by PCR: NEGATIVE
Resp Syncytial Virus by PCR: NEGATIVE
SARS Coronavirus 2 by RT PCR: NEGATIVE

## 2023-04-05 LAB — COMPREHENSIVE METABOLIC PANEL
ALT: 34 U/L (ref 0–44)
AST: 45 U/L — ABNORMAL HIGH (ref 15–41)
Albumin: 2.3 g/dL — ABNORMAL LOW (ref 3.5–5.0)
Alkaline Phosphatase: 130 U/L — ABNORMAL HIGH (ref 38–126)
Anion gap: 7 (ref 5–15)
BUN: 56 mg/dL — ABNORMAL HIGH (ref 8–23)
CO2: 20 mmol/L — ABNORMAL LOW (ref 22–32)
Calcium: 8.2 mg/dL — ABNORMAL LOW (ref 8.9–10.3)
Chloride: 114 mmol/L — ABNORMAL HIGH (ref 98–111)
Creatinine, Ser: 2.9 mg/dL — ABNORMAL HIGH (ref 0.61–1.24)
GFR, Estimated: 21 mL/min — ABNORMAL LOW (ref >60)
Glucose, Bld: 100 mg/dL — ABNORMAL HIGH (ref 70–99)
Potassium: 7.4 mmol/L (ref 3.5–5.1)
Sodium: 141 mmol/L (ref 135–145)
Total Bilirubin: 0.8 mg/dL (ref 0.3–1.2)
Total Protein: 7.5 g/dL (ref 6.5–8.1)

## 2023-04-05 LAB — CBC WITH DIFFERENTIAL/PLATELET
Abs Immature Granulocytes: 0.07 10*3/uL (ref 0.00–0.07)
Basophils Absolute: 0 10*3/uL (ref 0.0–0.1)
Basophils Relative: 0 %
Eosinophils Absolute: 0 10*3/uL (ref 0.0–0.5)
Eosinophils Relative: 0 %
HCT: 29.9 % — ABNORMAL LOW (ref 39.0–52.0)
Hemoglobin: 8.8 g/dL — ABNORMAL LOW (ref 13.0–17.0)
Immature Granulocytes: 1 %
Lymphocytes Relative: 5 %
Lymphs Abs: 0.7 10*3/uL (ref 0.7–4.0)
MCH: 26.6 pg (ref 26.0–34.0)
MCHC: 29.4 g/dL — ABNORMAL LOW (ref 30.0–36.0)
MCV: 90.3 fL (ref 80.0–100.0)
Monocytes Absolute: 0.3 10*3/uL (ref 0.1–1.0)
Monocytes Relative: 2 %
Neutro Abs: 11.9 10*3/uL — ABNORMAL HIGH (ref 1.7–7.7)
Neutrophils Relative %: 92 %
Platelets: 470 10*3/uL — ABNORMAL HIGH (ref 150–400)
RBC: 3.31 MIL/uL — ABNORMAL LOW (ref 4.22–5.81)
RDW: 16.6 % — ABNORMAL HIGH (ref 11.5–15.5)
WBC: 13 10*3/uL — ABNORMAL HIGH (ref 4.0–10.5)
nRBC: 0 % (ref 0.0–0.2)

## 2023-04-05 LAB — PROCALCITONIN: Procalcitonin: 7.83 ng/mL

## 2023-04-05 LAB — GLUCOSE, CAPILLARY: Glucose-Capillary: 101 mg/dL — ABNORMAL HIGH (ref 70–99)

## 2023-04-05 LAB — LACTIC ACID, PLASMA
Lactic Acid, Venous: 1.6 mmol/L (ref 0.5–1.9)
Lactic Acid, Venous: 0.8 mmol/L (ref 0.5–1.9)

## 2023-04-05 LAB — BRAIN NATRIURETIC PEPTIDE: B Natriuretic Peptide: 271.9 pg/mL — ABNORMAL HIGH (ref 0.0–100.0)

## 2023-04-05 MED ORDER — DEXTROSE 50 % IV SOLN
1.0000 | Freq: Once | INTRAVENOUS | Status: AC
  Administered 2023-04-05: 50 mL via INTRAVENOUS
  Filled 2023-04-05: qty 50

## 2023-04-05 MED ORDER — CALCIUM GLUCONATE-NACL 1-0.675 GM/50ML-% IV SOLN
1.0000 g | Freq: Once | INTRAVENOUS | Status: AC
  Administered 2023-04-05: 1000 mg via INTRAVENOUS
  Filled 2023-04-05: qty 50

## 2023-04-05 MED ORDER — LACTATED RINGERS IV BOLUS
1000.0000 mL | Freq: Once | INTRAVENOUS | Status: AC
  Administered 2023-04-05: 1000 mL via INTRAVENOUS

## 2023-04-05 MED ORDER — ACETAMINOPHEN 10 MG/ML IV SOLN
1000.0000 mg | Freq: Once | INTRAVENOUS | Status: AC
  Administered 2023-04-05: 1000 mg via INTRAVENOUS
  Filled 2023-04-05: qty 100

## 2023-04-05 MED ORDER — INSULIN ASPART 100 UNIT/ML IV SOLN
5.0000 [IU] | Freq: Once | INTRAVENOUS | Status: AC
  Administered 2023-04-05: 5 [IU] via INTRAVENOUS
  Filled 2023-04-05: qty 0.05

## 2023-04-05 MED ORDER — ALBUTEROL SULFATE (2.5 MG/3ML) 0.083% IN NEBU
10.0000 mg | INHALATION_SOLUTION | Freq: Once | RESPIRATORY_TRACT | Status: AC
  Administered 2023-04-05: 10 mg via RESPIRATORY_TRACT
  Filled 2023-04-05: qty 12

## 2023-04-05 MED ORDER — NOREPINEPHRINE 4 MG/250ML-% IV SOLN
2.0000 ug/min | INTRAVENOUS | Status: DC
  Administered 2023-04-05: 2 ug/min via INTRAVENOUS
  Administered 2023-04-06: 10 ug/min via INTRAVENOUS
  Filled 2023-04-05 (×2): qty 250

## 2023-04-05 MED ORDER — FUROSEMIDE 10 MG/ML IJ SOLN
40.0000 mg | Freq: Once | INTRAMUSCULAR | Status: AC
  Administered 2023-04-05: 40 mg via INTRAVENOUS
  Filled 2023-04-05: qty 4

## 2023-04-05 MED ORDER — SODIUM CHLORIDE 0.9 % IV BOLUS
500.0000 mL | Freq: Once | INTRAVENOUS | Status: AC
  Administered 2023-04-05: 500 mL via INTRAVENOUS

## 2023-04-05 MED ORDER — VANCOMYCIN HCL IN DEXTROSE 1-5 GM/200ML-% IV SOLN: 1000.0000 mg | Freq: Once | INTRAVENOUS | Status: DC

## 2023-04-05 MED ORDER — POLYETHYLENE GLYCOL 3350 17 G PO PACK: 17.0000 g | PACK | Freq: Every day | ORAL | Status: DC | PRN

## 2023-04-05 MED ORDER — DOCUSATE SODIUM 100 MG PO CAPS: 100.0000 mg | ORAL_CAPSULE | Freq: Two times a day (BID) | ORAL | Status: DC | PRN

## 2023-04-05 MED ORDER — HYDROCORTISONE SOD SUC (PF) 100 MG IJ SOLR
50.0000 mg | Freq: Once | INTRAMUSCULAR | Status: AC
  Administered 2023-04-06: 50 mg via INTRAVENOUS
  Filled 2023-04-05: qty 2

## 2023-04-05 MED ORDER — ACETAMINOPHEN 500 MG PO TABS: 1000.0000 mg | ORAL_TABLET | Freq: Once | ORAL | Status: DC

## 2023-04-05 MED ORDER — LEVOFLOXACIN IN D5W 750 MG/150ML IV SOLN
750.0000 mg | Freq: Once | INTRAVENOUS | Status: AC
  Administered 2023-04-05: 750 mg via INTRAVENOUS
  Filled 2023-04-05: qty 150

## 2023-04-05 MED ORDER — STERILE WATER FOR INJECTION IV SOLN
INTRAVENOUS | Status: DC
  Filled 2023-04-05: qty 150

## 2023-04-05 MED ORDER — VANCOMYCIN HCL 2000 MG/400ML IV SOLN
2000.0000 mg | Freq: Once | INTRAVENOUS | Status: AC
  Administered 2023-04-05: 2000 mg via INTRAVENOUS
  Filled 2023-04-05: qty 400

## 2023-04-05 MED ORDER — HEPARIN SODIUM (PORCINE) 5000 UNIT/ML IJ SOLN: 5000.0000 [IU] | Freq: Three times a day (TID) | INTRAMUSCULAR | Status: DC

## 2023-04-05 NOTE — ED Provider Notes (Signed)
[facility] Provider Note    Event Date/Time   First MD Initiated Contact with Patient 04/05/23 1853     (approximate)   History   Low Oxygen Saturations   HPI  [name] is a 82 y.o. male with history of CHF, COPD, HTN, CKD presenting to the emergency department for evaluation of hypoxia.  Per EMS report, patient had an oxygen saturation in the 80s since yesterday.  He arrives on a nonrebreather.  Does not wear oxygen at baseline.  Has a chronic Foley catheter as well as an ostomy.  Patient able to tell me his name, unable to provide other history, does have documented history of dementia.  I did review his discharge summary from 12/23/2022.  At that time, patient presented with fever and altered mental status, ultimately found to have severe sepsis secondary to suspected UTI in the setting of suprapubic catheter use.      Physical Exam   Triage Vital Signs: ED Triage Vitals  Encounter Vitals Group     BP 04/05/23 1853 (!) 156/105     Systolic BP Percentile --      Diastolic BP Percentile --      Pulse Rate 04/05/23 1853 82     Resp 04/05/23 1853 (!) 24     Temp 04/05/23 1853 (!) 102.4 F (39.1 C)     Temp Source 04/05/23 1853 Axillary     SpO2 04/05/23 1853 100 %     Weight 04/05/23 1854 192 lb 10.9 oz (87.4 kg)     Height --      Head Circumference --      [facility] Flow --      Pain Score 04/05/23 1854 0     Pain Loc --      Pain Education --      Exclude from Growth Chart --     Most recent vital signs: Vitals:   04/05/23 2300 04/05/23 2307  BP: (!) 96/48   Pulse: 75 76  Resp: (!) 22 (!) 22  Temp:    SpO2: 100% 100%     General: Awake, appears uncomfortable  CV:  Tachycardic with regular rhythm Resp:  Coarse lung sounds bilaterally with wet cough present, no significant wheezing Abd:  Soft, nondistended, no appreciable tenderness to palpation, suprapubic catheter in place with dark yellow urine in bag, ostomy in place with  brown stool output Neuro:  No gross facial asymmetry, generalized weakness of the upper extremities, able to squeeze hands on command.  Limited spontaneous movement or to command of the lower extremities, answers name, unable to answer other questions Skin:  There is a small area of relatively superficial skin breakdown over the decubitus region without significant surrounding erythema  ED Results / Procedures / Treatments   Labs (all labs ordered are listed, but only abnormal results are displayed) Labs Reviewed  COMPREHENSIVE METABOLIC PANEL - Abnormal; Notable for the following components:      Result Value   Potassium 7.4 (*)    Chloride 114 (*)    CO2 20 (*)    Glucose, Bld 100 (*)    BUN 56 (*)    Creatinine, Ser 2.90 (*)    Calcium 8.2 (*)    Albumin 2.3 (*)    AST 45 (*)    Alkaline Phosphatase 130 (*)    GFR, Estimated 21 (*)    All other components within normal limits  CBC WITH DIFFERENTIAL/PLATELET - Abnormal; Notable for the following  components:   WBC 13.0 (*)    RBC 3.31 (*)    Hemoglobin 8.8 (*)    HCT 29.9 (*)    MCHC 29.4 (*)    RDW 16.6 (*)    Platelets 470 (*)    Neutro Abs 11.9 (*)    All other components within normal limits  PROTIME-INR - Abnormal; Notable for the following components:   Prothrombin Time 19.4 (*)    INR 1.6 (*)    All other components within normal limits  URINALYSIS, W/ REFLEX TO CULTURE (INFECTION SUSPECTED) - Abnormal; Notable for the following components:   Color, Urine YELLOW (*)    APPearance TURBID (*)    Hgb urine dipstick SMALL (*)    Ketones, ur 5 (*)    Protein, ur >=300 (*)    Leukocytes,Ua SMALL (*)    Bacteria, UA MANY (*)    All other components within normal limits  BASIC METABOLIC PANEL - Abnormal; Notable for the following components:   CO2 17 (*)    Glucose, Bld 120 (*)    BUN 51 (*)    Creatinine, Ser 2.46 (*)    Calcium 6.8 (*)    GFR, Estimated 26 (*)    All other components within normal limits  BRAIN  NATRIURETIC PEPTIDE - Abnormal; Notable for the following components:   B Natriuretic Peptide 271.9 (*)    All other components within normal limits  BLOOD GAS, VENOUS - Abnormal; Notable for the following components:   pCO2, Ven 39 (*)    pO2, Ven <31 (*)    Bicarbonate 19.6 (*)    Acid-base deficit 6.2 (*)    All other components within normal limits  GLUCOSE, CAPILLARY - Abnormal; Notable for the following components:   Glucose-Capillary 101 (*)    All other components within normal limits  RESP PANEL BY RT-PCR (RSV, FLU A&B, COVID)  RVPGX2  CULTURE, BLOOD (ROUTINE X 2)  CULTURE, BLOOD (ROUTINE X 2)  URINE CULTURE  MRSA NEXT GEN BY PCR, NASAL  LACTIC ACID, PLASMA  LACTIC ACID, PLASMA  PROCALCITONIN  CBC  MAGNESIUM  PHOSPHORUS  COMPREHENSIVE METABOLIC PANEL  PROCALCITONIN     EKG EKG independently reviewed interpreted by myself (ER attending) demonstrates:  EKG demonstrates narrow complex rhythm at a rate of 94, QRS 101, QTc 442, Interpreted by computer as complete heart block, but I suspect this is due to artifact.  Does appear to have P waves most appreciable over the rhythm strip of lead II.  Repeat EKG obtained at 1921 which remains with significant artifact secondary to patient's bilateral resting tremor that redemonstrates narrow complex rhythm sinus rhythm versus junctional rhythm  Patient was given temporizing treatment at a rate of 93, QRS 116, QTc 447  RADIOLOGY Imaging independently reviewed and interpreted by myself demonstrates:    PROCEDURES:  Critical Care performed: Yes, see critical care procedure note(s)  CRITICAL CARE Performed by: Trinna Post   Total critical care time: 55 minutes  Critical care time was exclusive of separately billable procedures and treating other patients.  Critical care was necessary to treat or prevent imminent or life-threatening deterioration.  Critical care was time spent personally by me on the following activities:  development of treatment plan with patient and/or surrogate as well as nursing, discussions with consultants, evaluation of patient's response to treatment, examination of patient, obtaining history from patient or surrogate, ordering and performing treatments and interventions, ordering and review of laboratory studies, ordering and review of radiographic studies,  pulse oximetry and re-evaluation of patient's condition.   Procedures   MEDICATIONS ORDERED IN ED: Medications  norepinephrine (LEVOPHED) 4mg  in (0.016 mg/mL) premix infusion (10 mcg/min Intravenous Rate/Dose Change 04/05/23 2253)  docusate sodium (COLACE) capsule 100 mg (has no administration in time range)  polyethylene glycol (MIRALAX / GLYCOLAX) packet 17 g (has no administration in time range)  heparin injection 5,000 Units (has no administration in time range)  levofloxacin (LEVAQUIN) IVPB 750 mg (0 mg Intravenous Stopped 04/05/23 2150)  vancomycin (VANCOREADY) IVPB 2000 mg/400 mL (0 mg Intravenous Stopped 04/05/23 2230)  furosemide (LASIX) injection 40 mg (40 mg Intravenous Given 04/05/23 2000)  calcium gluconate 1 g/ 50 mL sodium chloride IVPB (0 mg Intravenous Stopped 04/05/23 2012)  albuterol (PROVENTIL) (2.5 MG/3ML) 0.083% nebulizer solution 10 mg (10 mg Nebulization Given 04/05/23 2000)  insulin aspart (novoLOG) injection 5 Units (5 Units Intravenous Given 04/05/23 2007)    And  dextrose 50 % solution 50 mL (50 mLs Intravenous Given 04/05/23 2000)  lactated ringers bolus 1,000 mL (0 mLs Intravenous Stopped 04/05/23 2122)  acetaminophen (OFIRMEV) IV 1,000 mg (0 mg Intravenous Stopped 04/05/23 2122)  lactated ringers bolus 1,000 mL (0 mLs Intravenous Stopped 04/05/23 2230)  sodium chloride 0.9 % bolus 500 mL (0 mLs Intravenous Stopped 04/05/23 2313)     IMPRESSION / MDM / ASSESSMENT AND PLAN / ED COURSE  I reviewed the triage vital signs and the nursing notes.  Differential diagnosis includes, but is not limited to,  questionable sepsis due to pneumonia, UTI, lower suspicion for infection secondary to decubitus ulcer based on exam  Patient's presentation is most consistent with acute presentation with potential threat to life or bodily function.  82 year old male presenting febrile and hypoxic with left lung sounds.  Sepsis orders initiated.  Has penicillin allergy so ordered for Levaquin and vancomycin given that he is coming from a facility.  I am concerned about patient's ability to tolerate p.o. intake and with his ostomy, I did order IV acetaminophen.  He arrives in a nonrebreather, but was transitioned to high flow nasal cannula at 8 L.  Labs demonstrate leukocytosis with WBC of 13, stable anemia with hemoglobin of 8.8.  CMP notable for significant hyperkalemia with K of 7.4, AKI with creatinine of 2.9, baseline appears around 1.  Patient was given temporizing treatment and a repeat BMP was obtained with significant improvement with a potassium of 4.9, improving creatinine at 2.46.  Urine is somewhat concerning for infection, but taken through an existing suprapubic catheter, so likely colonization and does actually improved compared to some priors.  However, patient's x-Juwann Sherk is concerning for pneumonia with asymmetric patchy opacities on the left.  While here, patient did have brief hypotension, but was fluid responsive.  Will reach out to hospitalist team for admission.  While awaiting callback from the hospitalist team, I did note that patient did have recurrent hypotension despite fluid resuscitation.  I did receive a call back from Dr. Clyde Lundborg who evaluated the patient at bedside, but we both agreed that patient was more appropriate for ICU admission.  Patient was reviewed with NP Cruz Condon with ICU team.  Patient admitted to the ICU for further management.    FINAL CLINICAL IMPRESSION(S) / ED DIAGNOSES   Final diagnoses:  Pneumonia of left lung due to infectious organism, unspecified part of lung  Septic  shock (HCC)  Hyperkalemia     Rx / DC Orders   ED Discharge Orders     None  Note:  This document was prepared using Dragon voice recognition software and may include unintentional dictation errors.   Trinna Post, MD 04/05/23 [phone]

## 2023-04-05 NOTE — ED Notes (Signed)
Pt oxygen level changed to 6l/min on [place].

## 2023-04-05 NOTE — H&P (Incomplete)
NAME:  [name], MRN:  [id number], DOB:  [birth year], LOS: 0 ADMISSION DATE:  04/05/2023, CONSULTATION DATE:  04/05/23 REFERRING MD:  Dr. Rosalia Hammers, CHIEF COMPLAINT:  Hypoxia   History of Present Illness:  82 year old male presenting to [facility] ED from [facility] via EMS on 04/05/2023 for evaluation of hypoxia.  History provided per chart review and interview with SNF staff, patient too altered to participate in bedside interview. According to staff patient has " not been feeling well" since Monday, 04/04/2023.  Unfortunately there were no documented specific symptoms or complaints.  This staff member reported arriving at 3 PM and being called into the patient's room finding him using his accessory muscles to breathe with SpO2 of 74%.  She called EMS at that time.  This staff member shared that at baseline he is A&O x 4, able to make all his own decisions and he does not have any emergency contacts or legal guardian. However, previous chart documentation has noted a history of dementia. Patient is immobile at baseline due to weakness. According to paperwork sent with the patient he received all of his a.m. meds on 04/05/2023. EMS reported the patient's SpO2 has been in the 80s since 04/03/2023 and he was placed on a nonrebreather during transport.  The patient does not wear oxygen at baseline.  He has a chronic Foley catheter and an ostomy.   Of note patient was hospitalized in May 2024 for due to severe sepsis secondary to suspected UTI in the setting of chronic Foley catheter use. At this time he was noted to have swallowing issues as well. Spoke with the person listed as the responsible party for this patient: Achilles Dunk, who stated he was the owner of the previous facility the [facility] was in before [facility]. He confirmed the patient has no family and had recommended the SNF initiate the process with DSS for legal guardianship. It is unclear if this process was started, but the patient currently does not  have any legal guardian.  ED course: Upon arrival patient alert and responsive x 1, unable to share any history.  He was febrile and tachypneic on arrival with SpO2 100% on non rebreather, this was quickly weaned to 4 L nasal cannula.  BP initially stable, however patient became hypotensive persistent after IV fluid resuscitation requiring vasopressor support.  Chest x-ray suspicious for pneumonia, CT imaging consistent with multifocal vs aspiration pneumonia, new 7 mm stone noted- non obstructive.  No other new findings. Labs significant for hyperkalemia, metabolic acidosis and AKI on CKD as well as leukocytosis with left shift and mild Transaminitis.  Medications given: albuterol 10 mg, insulin 5 units & amp of D50, lasix 40 mg, 1 g Tylenol, 1g Ca+ gluconate, 2.5 L bolus, levofloxacin & vancomycin Initial Vitals: 102.4, 24, 82, 156/105 and 100% on NRB 15 L Significant labs: (Labs/ Imaging personally reviewed) I, Cheryll Cockayne Rust-Chester, AGACNP-BC, personally viewed and interpreted this ECG. EKG Interpretation: Date: 04/05/23, EKG Time: 18:55, Rate: 94, Rhythm: NSR, QRS Axis:  normal Intervals: normal (challenging due to artifact), ST/T Wave abnormalities: none - challenging due to artifact, Narrative Interpretation: NSR, challenging due to artifact Chemistry: Na+: 141, K+: 7.4 > 4.9, BUN/Cr.: 56/2.9 > 51/2.46, Cl: 114, Serum CO2/ AG: 20/7 > 17/10, albumin: 2.3, alk phos: 130, AST/ALT: 45/ 34 Hematology: WBC: 13, Hgb: 8.8, plt: 470  BNP: 271.9, Lactic/ PCT: 1.6 > 0.8/ 7.83, COVID-19 & Influenza A/B: negative  VBG: 7.31/ 39/ <31/ 19.6 CXR 04/05/23: asymmetric Left lung patchy  confluent opacities suspicious for pneumonia CT head wo contrast 04/05/23: Chronic atrophic and ischemic changes as described. No acute abnormality noted. CT abdomen/pelvis wo contrast 04/05/23: Bilateral airspace disease, left greater than right, consistent with multifocal pneumonia or aspiration. Stable left hydronephrosis and  hydroureter, unchanged since prior exam. 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 Chronic nonspecific bladder wall thickening with suprapubic catheter in place. New 7 mm nonobstructing bladder calculus. Cholelithiasis without cholecystitis. Progressive rectal wall thickening, consistent with proctitis.  Aortic Atherosclerosis    PCCM consulted for admission due to circulatory shock suspect hypovolemic +/- sepsis with acute hypoxic respiratory failure s/t suspected aspiration on vasopressor support.  Pertinent  Medical History  Bipolar Disorder Schizophrenia C. 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  Interim History / Subjective:  Patient alert and responsive but altered with garbled speech, able to state first name.  Objective   Blood pressure (!) 84/43, pulse 74, temperature 98.6 F (37 C), temperature source Axillary, resp. rate (!) 21, weight 87.4 kg, SpO2 100%.       No intake or output data in the 24 hours ending 04/05/23 2243 Filed Weights   04/05/23 1854  Weight: 87.4 kg    Examination: General: Adult male, critically ill, lying in bed, NAD HEENT: MM pale/dry, anicteric, atraumatic, neck supple Neuro: A&O x 1, able to follow simple commands, PERRL +3, moves BUE against gravity/ able to wiggle toes bilaterally > immobile at baseline CV: s1s2 RRR, NSR on monitor, no r/+m/g Pulm: Regular, non labored on 4 L [place], breath sounds coarse crackles throughout L > R GI: soft, rounded with ostomy in place, non tender, bs x 4 GU: foley in place with cloudy yellow  urine Skin: sacral & scrotal pressure injuries  Extremities: warm/dry, pulses + 2 R/P, +2 edema noted BLE  Resolved Hospital Problem list     Assessment & Plan:  Circulatory shock suspect secondary to hypovolemia +/- suspected sepsis Multifocal Pneumonia suspect s/t Aspiration Chronic sacral and scrotal pressure injuries noted on admission Initial interventions/workup included: 2.5 L of NS/LR & Levaquin/ Vancomycin - f/u cultures, trend lactic/ PCT - Daily CBC, monitor WBC/ fever curve - IV antibiotics:  Unasyn & Zyvox for 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) - conservative IVF hydration as patient appears overloaded with tenuous respiratory status - Continue vasopressors to maintain MAP< 65: norepinephrine - Evaluate stress dose steroids: one dose of hydrocortisone 50 mg ordered in the setting of multifocal pneumonia, consider continuing Q 6 -Turn every 2, utilize offloading devices -WOC consultation, appreciate input  Acute Hypoxic Respiratory Failure secondary to suspected aspiration pneumonia PMHx: COPD - Consider BIPAP PRN overnight, wean FiO2 as tolerated - Supplemental O2 to maintain SpO2 > 90% - Intermittent chest x-ray & ABG PRN - Ensure adequate pulmonary hygiene  - F/u cultures, trend PCT - Continue Aspiration Pna coverage: Unasyn - budesonide nebs BID, Duo nebs BID, bronchodilators PRN  Acute Kidney Injury superimposed on CKD Stage 3a  NAGMA Non-obstructing new 7 mm bladder calculi Hyperkalemia - improved after shifting measures Stable left hydronephrosis and hydroureter. 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 PMHx: CHF,  HTN, HLD - Echocardiogram ordered -Outpatient carvedilol on hold due to hypotension & pravastatin on hold as patient is n.p.o. 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 -Consider restarting outpatient Eliquis as patient stabilizes  Acute encephalopathy in the setting of baseline vascular dementia Seizure disorder PMHx: Bipolar disorder and schizophrenia -TOC consulted to assist with DSS evaluation for legal guardianship if process has not already been initiated by SNF -Outpatient phenobarbital continued as IV dosing -Outpatient Abilify, bupropion, Suvorexant on hold as patient is n.p.o.  Swallowing difficulties and possible aspiration -SLP consulted to evaluate, appreciate input -N.p.o. for now  Best Practice (right click and "Reselect all SmartList Selections" daily)  Diet/type: NPO DVT prophylaxis: systemic dose LMWH GI prophylaxis: PPI Lines: N/A Foley:  Yes, and it is still needed Code Status:  full code Last date of multidisciplinary goals of care discussion [UTA- no family/guardian, utilizing previous paperwork from SNF]  Labs   CBC: Recent Labs  Lab 04/05/23 1904  WBC 13.0*  NEUTROABS 11.9*  HGB 8.8*  HCT 29.9*  MCV 90.3  PLT 470*    Basic Metabolic Panel: Recent Labs  Lab 04/05/23 1904 04/05/23 2114  NA 141 135  K 7.4* 4.9  CL 114* 108  CO2 20* 17*  GLUCOSE 100* 120*  BUN 56* 51*  CREATININE 2.90* 2.46*  CALCIUM 8.2* 6.8*   GFR: Estimated Creatinine Clearance: 24.7 mL/min (A) (by C-G formula based on SCr of 2.46 mg/dL (H)). Recent Labs  Lab 04/05/23 1904  WBC 13.0*  LATICACIDVEN 1.6    Liver Function Tests: Recent Labs  Lab 04/05/23 1904  AST  45*  ALT 34  ALKPHOS 130*  BILITOT 0.8  PROT 7.5  ALBUMIN 2.3*   No results for input(s): "LIPASE", "AMYLASE" in the last 168 hours. No results for input(s): "AMMONIA" in the last 168 hours.  ABG    Component Value Date/Time   HCO3 23.7 05/02/2022 2121   ACIDBASEDEF 1.3 05/02/2022 2121   O2SAT 78.1 05/02/2022 2121     Coagulation Profile: Recent Labs  Lab 04/05/23 1904  INR 1.6*    Cardiac Enzymes: No results for input(s): "CKTOTAL", "CKMB", "CKMBINDEX", "TROPONINI" in the last 168 hours.  HbA1C: Hgb A1c MFr Bld  Date/Time Value Ref Range Status  05/03/2022 04:06 AM 5.1 4.8 - 5.6 % Final    Comment:    (NOTE) Pre diabetes:          5.7%-6.4%  Diabetes:              >6.4%  Glycemic control for   <7.0% adults with diabetes   01/12/2022 05:37 AM 5.8 (H) 4.8 - 5.6 % Final    Comment:    (NOTE) Pre diabetes:          5.7%-6.4%  Diabetes:              >6.4%  Glycemic control for   <7.0% adults with diabetes     CBG: No results for  input(s): "GLUCAP" in the last 168 hours.  Review of Systems:   UTA- patient unable to participate in history at this time  Past Medical History:  He,  has a past medical history of Bipolar 1 disorder (HCC), C. difficile diarrhea (05/2016), CHF (congestive heart failure) (HCC), Chronic kidney disease (05/21/2016), COPD (chronic obstructive pulmonary disease) (HCC), DVT (deep venous thrombosis) (HCC), GERD (gastroesophageal reflux disease), Hematuria, High cholesterol, History of kidney stones, Hypertension, Penile erosion (2018), Schizophrenia (HCC), Seizure (HCC), Urinary retention, and VRE (vancomycin resistant enterococcus) culture positive.   Surgical History:   Past Surgical History:  Procedure Laterality Date   COLECTOMY WITH COLOSTOMY CREATION/HARTMANN PROCEDURE N/A 05/12/2022   Procedure: PARTIAL COLECTOMY WITH COLOSTOMY CREATION/HARTMANN PROCEDURE;  Surgeon: Carolan Shiver, MD;  Location: [facility] ORS;  Service:  General;  Laterality: N/A;   COLONOSCOPY WITH PROPOFOL N/A 05/09/2022   Procedure: COLONOSCOPY WITH PROPOFOL;  Surgeon: Jaynie Collins, DO;  Location[facility] ENDOSCOPY;  Service: Gastroenterology;  Laterality: N/A;   EXTRACORPOREAL SHOCK WAVE LITHOTRIPSY Right 05/05/2017   Procedure: EXTRACORPOREAL SHOCK WAVE LITHOTRIPSY (ESWL);  Surgeon: Vanna Scotland, MD;  Location: [facility] ORS;  Service: Urology;  Laterality: Right;   IR CATHETER TUBE CHANGE  06/23/2017   IR CATHETER TUBE CHANGE  06/28/2017   IVC FILTER INSERTION N/A 05/11/2022   Procedure: IVC FILTER INSERTION;  Surgeon: Renford Dills, MD;  Location: [facility] INVASIVE CV LAB;  Service: Cardiovascular;  Laterality: N/A;   LAPAROTOMY N/A 05/12/2022   Procedure: EXPLORATORY LAPAROTOMY;  Surgeon: Carolan Shiver, MD;  Location: [facility] ORS;  Service: General;  Laterality: N/A;     Social History:   reports that he has never smoked. He has never been exposed to tobacco smoke. He has never used smokeless tobacco. He reports that he does not drink alcohol and does not use drugs.   Family History:  His family history includes Cancer in his mother. There is no history of Prostate cancer, Kidney disease, Kidney cancer, or Bladder Cancer.   Allergies Allergies  Allergen Reactions   Penicillin G Rash and Other (See Comments)    Has tolerated cephalosporins, zosyn, and augmentin     Home Medications  Prior to Admission medications   Medication Sig Start Date End Date Taking? Authorizing Provider  albuterol (VENTOLIN HFA) 108 (90 Base) MCG/ACT inhaler Inhale 2 puffs into the lungs every 6 (six) hours as needed for wheezing or shortness of breath.    [provider]  ammonium lactate (AMLACTIN) 12 % cream Apply 1 Application topically every 12 (twelve) hours.    [provider]  ARIPiprazole (ABILIFY) 20 MG tablet Take 20 mg by mouth in the morning.    [provider]  ascorbic acid (VITAMIN C) 500 MG tablet Take 1  tablet (500 mg total) by mouth 2 (two) times daily. 01/16/22   Lynn Ito, MD  buPROPion (BUDEPRION XL) 300 MG 24 hr tablet Take 1 tablet (300 mg total) by mouth daily. 04/26/16   Alford Highland, MD  carvedilol (COREG) 25 MG tablet Take 25 mg by mouth 2 (two) times daily. 04/27/22   [provider]  cetirizine (ZYRTEC) 10 MG tablet Take 10 mg by mouth in the morning.    [provider]  cholecalciferol (VITAMIN D3) 25 MCG (1000 UNIT) tablet Take 1,000 Units by mouth in the morning.    [provider]  Cranberry 250 MG CAPS Take 250 mg by mouth 2 (two) times daily.    [provider]  ELIQUIS 2.5 MG TABS  tablet Take 2.5 mg by mouth 2 (two) times daily.    [provider]  ferrous sulfate 325 (65 FE) MG tablet Take 325 mg by mouth daily with breakfast.    [provider]  finasteride (PROSCAR) 5 MG tablet Take 1 tablet (5 mg total) by mouth daily. 04/27/16   Wieting, Richard, MD  fluticasone furoate-vilanterol (BREO ELLIPTA) 100-25 MCG/ACT AEPB Inhale 1 puff into the lungs daily. 05/18/22   Pennie Banter, DO  furosemide (LASIX) 20 MG tablet Take 1 tablet (20 mg total) by mouth 2 (two) times daily. 05/17/22   Pennie Banter, DO  guaiFENesin (ROBITUSSIN) 100 MG/5ML liquid Take 5 mLs by mouth every 4 (four) hours as needed for cough or to loosen phlegm. 05/17/22   Pennie Banter, DO  lactulose (CHRONULAC) 10 GM/15ML solution Take 15 mLs (10 g total) by mouth every 6 (six) hours as needed for mild constipation or moderate constipation. 05/17/22   Pennie Banter, DO  Multiple Vitamin (MULTI-VITAMINS) TABS Take 1 tablet by mouth daily.    [provider]  nystatin (MYCOSTATIN/NYSTOP) powder Apply 1 Application topically 2 (two) times daily.    [provider]  PHENobarbital (LUMINAL) 64.8 MG tablet Take 64.8 mg by mouth daily.    [provider]  polyethylene glycol (MIRALAX / GLYCOLAX) 17 g packet Take 17 g by mouth  daily as needed. 05/17/22   Esaw Grandchild A, DO  potassium chloride SA (KLOR-CON M) 20 MEQ tablet Take 1 tablet (20 mEq total) by mouth 2 (two) times daily. 05/17/22   Esaw Grandchild A, DO  pravastatin (PRAVACHOL) 10 MG tablet Take 10 mg by mouth at bedtime. 04/28/22   [provider]  Suvorexant 5 MG TABS Take 5 mg by mouth at bedtime.    [provider]  umeclidinium bromide (INCRUSE ELLIPTA) 62.5 MCG/ACT AEPB Inhale 1 puff into the lungs daily. 05/18/22   Esaw Grandchild A, DO  zinc oxide 20 % ointment Apply 1 application. topically 2 (two) times daily. (Apply to legs)    [provider]     Critical care time: 68 minutes       Betsey Holiday, AGACNP-BC Acute Care Nurse Practitioner [facility]   [phone] / [phone] Please see Amion for details.

## 2023-04-05 NOTE — ED Notes (Signed)
VBG sent on ice. RT notified.

## 2023-04-05 NOTE — ED Notes (Signed)
Pt placed on 10l/min at this time.

## 2023-04-05 NOTE — ED Notes (Addendum)
Patient transported to CT by RN.

## 2023-04-05 NOTE — ED Notes (Signed)
RN spoke with Wynona Canes, RN at [facility] and notified her that pt was going to be admitted the the hospital.

## 2023-04-05 NOTE — ED Triage Notes (Signed)
Pt arrives via EMS from [facility]. Per EMS pt has been having low o2 sats in the 80's since yesterday. Pt currently is on a NRB at 15l.min. Pt does not wear o2 at baseline. 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 Pt also has a ostomy.

## 2023-04-05 NOTE — Consult Note (Signed)
PHARMACY -  BRIEF ANTIBIOTIC NOTE   Pharmacy has received consult(s) for Vancomycin and Levofloxacin from an ED provider.  The patient's profile has been reviewed for ht/wt/allergies/indication/available labs.    One time order(s) placed for Vancomycin 2g IV and Levofloxacin 750mg  IV.  Further antibiotics/pharmacy consults should be ordered by admitting physician if indicated.                       Thank you, Bettey Costa 04/05/2023  7:41 PM

## 2023-04-05 NOTE — H&P (Incomplete)
NAME:  [name], MRN:  [id number], DOB:  April 07, 1941, LOS: 0 ADMISSION DATE:  04/05/2023, CONSULTATION DATE:  04/05/23 REFERRING MD:  Dr. Rosalia Hammers, CHIEF COMPLAINT:  Hypoxia   History of Present Illness:  82 year old male presenting to [facility] on 04/05/2023 for evaluation of hypoxia.  History provided per chart review and interview with SNF staff, patient too altered to participate in bedside interview. According to staff patient has " not been feeling well" since Monday, 04/04/2023.  Unfortunately there were no documented specific symptoms or complaints.  This staff member reported arriving at 3 PM and being called into the patient's room finding him using his accessory muscles to breathe with SpO2 of 74%.  She called EMS at that time.  This staff member shared that at baseline he is A&O x 4, able to make all his own decisions and he does not have any emergency contacts or legal guardian. However, previous chart documentation has noted a history of dementia. According to paperwork sent with the patient he received all of his a.m. meds on 04/05/2023. EMS reported the patient's SpO2 has been in the 80s since 04/03/2023 and he was placed on a nonrebreather during transport.  The patient does not wear oxygen at baseline.  He has a chronic Foley catheter and an ostomy.   Of note patient was hospitalized in May 2020 for due to severe sepsis secondary to suspected UTI in the setting of chronic Foley catheter use. ED course: Upon arrival patient alert and responsive x 1, unable to share any history.  He was febrile and tachypneic on arrival with SpO2 100% on non rebreather, this was quickly weaned to 4 L nasal cannula.  BP initially stable, however patient became hypotensive persistent after IV fluid resuscitation requiring vasopressor support.  Chest x-ray suspicious for pneumonia, CT imaging consistent with multifocal vs aspiration pneumonia, new 7 mm stone noted- non obstructive.   No other new findings. Labs significant for hyperkalemia, metabolic acidosis and AKI on CKD as well as leukocytosis with left shift and mild Transaminitis.  Medications given: albuterol 10 mg, insulin 5 units & amp of D50, lasix 40 mg, 1 g Tylenol, 1g Ca+ gluconate, 2.5 L bolus, levofloxacin & vancomycin Initial Vitals: 102.4, 24, 82, 156/105 and 100% on NRB 15 L Significant labs: (Labs/ Imaging personally reviewed) I, Cheryll Cockayne Rust-Chester, AGACNP-BC, personally viewed and interpreted this ECG. EKG Interpretation: Date: 04/05/23, EKG Time: 18:55, Rate: 94, Rhythm: NSR, QRS Axis:  normal Intervals: normal (challenging due to artifact), ST/T Wave abnormalities: none - challenging due to artifact, Narrative Interpretation: NSR, challenging due to artifact Chemistry: Na+: 141, K+: 7.4 > 4.9, BUN/Cr.: 56/2.9 > 51/2.46, Cl: 114, Serum CO2/ AG: 20/7 > 17/10, albumin: 2.3, alk phos: 130, AST/ALT: 45/ 34 Hematology: WBC: 13, Hgb: 8.8, plt: 470  BNP: 271.9, Lactic/ PCT: 1.6 > 0.8/ 7.83, COVID-19 & Influenza A/B: negative  VBG: 7.31/ 39/ <31/ 19.6 CXR 04/05/23: asymmetric Left lung patchy confluent opacities suspicious for pneumonia CT head wo contrast 04/05/23: Chronic atrophic and ischemic changes as described. No acute abnormality noted. CT abdomen/pelvis wo contrast 04/05/23: Bilateral airspace disease, left greater than right, consistent with multifocal pneumonia or aspiration. Stable left hydronephrosis and hydroureter, unchanged since prior exam. 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 Chronic nonspecific bladder wall thickening with suprapubic catheter in place. New 7 mm nonobstructing bladder calculus. Cholelithiasis without cholecystitis. Progressive rectal wall thickening, consistent with proctitis.  Aortic Atherosclerosis  PCCM consulted for admission due to circulatory shock suspect hypovolemic +/- sepsis with acute hypoxic respiratory  failure s/t suspected aspiration on vasopressor support.  Pertinent  Medical History  Bipolar Disorder Schizophrenia C. 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  Interim History / Subjective:  Patient alert and responsive but altered with garbled speech, able to state first name.  Objective   Blood pressure (!) 84/43, pulse 74, temperature 98.6 F (37 C), temperature source Axillary, resp. rate (!) 21, weight 87.4 kg, SpO2 100%.       No intake or output data in the 24 hours ending 04/05/23 2243 Filed Weights   04/05/23 1854  Weight: 87.4 kg    Examination: General: Adult male, critically ill, lying in bed, NAD HEENT: MM pale/dry, anicteric, atraumatic, neck supple Neuro: A&O x 1, unable to follow commands, PERRL +3, MAE*** CV: s1s2 ***RRR, *** on monitor, no r/m/g Pulm: Regular, non labored on *** , breath sounds ***-BUL & ***-BLL GI: soft, ***, non***tender, bs x 4 GU: foley in place *** with clear yellow urine Skin: *** no rashes/lesions noted Extremities: warm/dry, pulses + 2 R/P, *** edema noted  Resolved Hospital Problem list     Assessment & Plan:  Sepsis with shock due to suspected CAP Lactic: ***, Baseline PCT: ***, UA: ***, CXR: ***, CT: ***  Initial interventions/workup included: *** L of NS/LR*** & Cefepime/ Vancomycin/ Metronidazole*** - Supplemental oxygen as needed, to maintain SpO2 > 90% - f/u cultures, trend lactic/ PCT - Daily CBC, monitor WBC/ fever curve - IV antibiotics: cefepime (HCAP/UTI)*** & vancomycin *** ceftriaxone (CAP) *** zosyn (intra-abdominal) *** unasyn  (aspiration PNA) - IVF hydration as needed: *** - Consider/*** vasopressors to maintain MAP< 65, norepinephrine***vasopressin - Strict I/O's: alert provider if UOP < 0.5 mL/kg/hr - Persistent hypotension consider stress dose steroids (hydrocortisone 50q6 or 100q8)   Best Practice (right click and "Reselect all SmartList Selections" daily)   Diet/type: {diet type:25684} DVT prophylaxis: {anticoagulation (Optional):25687} GI prophylaxis: {UJ:81191} Lines: {Central Venous Access:25771} Foley:  {Central Venous Access:25691} Code Status:  {Code Status:26939} Last date of multidisciplinary goals of care discussion [***]  Labs   CBC: Recent Labs  Lab 04/05/23 1904  WBC 13.0*  NEUTROABS 11.9*  HGB 8.8*  HCT 29.9*  MCV 90.3  PLT 470*    Basic Metabolic Panel: Recent Labs  Lab 04/05/23 1904 04/05/23 2114  NA 141 135  K 7.4* 4.9  CL 114* 108  CO2 20* 17*  GLUCOSE 100* 120*  BUN 56* 51*  CREATININE 2.90* 2.46*  CALCIUM 8.2* 6.8*   GFR: Estimated Creatinine Clearance: 24.7 mL/min (A) (by C-G formula based on SCr of 2.46 mg/dL (H)). Recent Labs  Lab 04/05/23 1904  WBC 13.0*  LATICACIDVEN 1.6    Liver Function Tests: Recent Labs  Lab 04/05/23 1904  AST 45*  ALT 34  ALKPHOS 130*  BILITOT 0.8  PROT 7.5  ALBUMIN 2.3*   No results for input(s): "LIPASE", "AMYLASE" in the last 168 hours. No results for input(s): "AMMONIA" in the last 168 hours.  ABG    Component Value Date/Time   HCO3 23.7 05/02/2022 2121   ACIDBASEDEF 1.3 05/02/2022 2121  O2SAT 78.1 05/02/2022 2121     Coagulation Profile: Recent Labs  Lab 04/05/23 1904  INR 1.6*    Cardiac Enzymes: No results for input(s): "CKTOTAL", "CKMB", "CKMBINDEX", "TROPONINI" in the last 168 hours.  HbA1C: Hgb A1c MFr Bld  Date/Time Value Ref Range Status  05/03/2022 04:06 AM 5.1 4.8 - 5.6 % Final    Comment:    (NOTE) Pre diabetes:          5.7%-6.4%  Diabetes:              >6.4%  Glycemic control  for   <7.0% adults with diabetes   01/12/2022 05:37 AM 5.8 (H) 4.8 - 5.6 % Final    Comment:    (NOTE) Pre diabetes:          5.7%-6.4%  Diabetes:              >6.4%  Glycemic control for   <7.0% adults with diabetes     CBG: No results for input(s): "GLUCAP" in the last 168 hours.  Review of Systems:   ***  Past Medical History:  He,  has a past medical history of Bipolar 1 disorder (HCC), C. difficile diarrhea (05/2016), CHF (congestive heart failure) (HCC), Chronic kidney disease (05/21/2016), COPD (chronic obstructive pulmonary disease) (HCC), DVT (deep venous thrombosis) (HCC), GERD (gastroesophageal reflux disease), Hematuria, High cholesterol, History of kidney stones, Hypertension, Penile erosion (2018), Schizophrenia (HCC), Seizure (HCC), Urinary retention, and VRE (vancomycin resistant enterococcus) culture positive.   Surgical History:   Past Surgical History:  Procedure Laterality Date  . COLECTOMY WITH COLOSTOMY CREATION/HARTMANN PROCEDURE N/A 05/12/2022   Procedure: PARTIAL COLECTOMY WITH COLOSTOMY CREATION/HARTMANN PROCEDURE;  Surgeon: Carolan Shiver, MD;  Location: [facility] ORS;  Service: General;  Laterality: N/A;  . COLONOSCOPY WITH PROPOFOL N/A 05/09/2022   Procedure: COLONOSCOPY WITH PROPOFOL;  Surgeon: Jaynie Collins, DO;  Location: [facility] ENDOSCOPY;  Service: Gastroenterology;  Laterality: N/A;  . EXTRACORPOREAL SHOCK WAVE LITHOTRIPSY Right 05/05/2017   Procedure: EXTRACORPOREAL SHOCK WAVE LITHOTRIPSY (ESWL);  Surgeon: Vanna Scotland, MD;  Location: [facility] ORS;  Service: Urology;  Laterality: Right;  . IR CATHETER TUBE CHANGE  06/23/2017  . IR CATHETER TUBE CHANGE  06/28/2017  . IVC FILTER INSERTION N/A 05/11/2022   Procedure: IVC FILTER INSERTION;  Surgeon: Renford Dills, MD;  Location: [facility] INVASIVE CV LAB;  Service: Cardiovascular;  Laterality: N/A;  . LAPAROTOMY N/A 05/12/2022   Procedure: EXPLORATORY LAPAROTOMY;  Surgeon: Carolan Shiver,  MD;  Location: [facility] ORS;  Service: General;  Laterality: N/A;     Social History:   reports that he has never smoked. He has never been exposed to tobacco smoke. He has never used smokeless tobacco. He reports that he does not drink alcohol and does not use drugs.   Family History:  His family history includes Cancer in his mother. There is no history of Prostate cancer, Kidney disease, Kidney cancer, or Bladder Cancer.   Allergies Allergies  Allergen Reactions  . Penicillin G Rash and Other (See Comments)    Has tolerated cephalosporins, zosyn, and augmentin     Home Medications  Prior to Admission medications   Medication Sig Start Date End Date Taking? Authorizing Provider  albuterol (VENTOLIN HFA) 108 (90 Base) MCG/ACT inhaler Inhale 2 puffs into the lungs every 6 (six) hours as needed for wheezing or shortness of breath.    [provider]  ammonium lactate (AMLACTIN) 12 % cream Apply 1 Application topically every 12 (twelve) hours.  [provider]  ARIPiprazole (ABILIFY) 20 MG tablet Take 20 mg by mouth in the morning.    [provider]  ascorbic acid (VITAMIN C) 500 MG tablet Take 1 tablet (500 mg total) by mouth 2 (two) times daily. 01/16/22   Lynn Ito, MD  buPROPion (BUDEPRION XL) 300 MG 24 hr tablet Take 1 tablet (300 mg total) by mouth daily. 04/26/16   Alford Highland, MD  carvedilol (COREG) 25 MG tablet Take 25 mg by mouth 2 (two) times daily. 04/27/22   [provider]  cetirizine (ZYRTEC) 10 MG tablet Take 10 mg by mouth in the morning.    [provider]  cholecalciferol (VITAMIN D3) 25 MCG (1000 UNIT) tablet Take 1,000 Units by mouth in the morning.    [provider]  Cranberry 250 MG CAPS Take 250 mg by mouth 2 (two) times daily.    [provider]  ELIQUIS 2.5 MG TABS tablet Take 2.5 mg by mouth 2 (two) times daily.    [provider]  ferrous sulfate 325 (65 FE) MG tablet Take 325 mg by  mouth daily with breakfast.    [provider]  finasteride (PROSCAR) 5 MG tablet Take 1 tablet (5 mg total) by mouth daily. 04/27/16   Wieting, Richard, MD  fluticasone furoate-vilanterol (BREO ELLIPTA) 100-25 MCG/ACT AEPB Inhale 1 puff into the lungs daily. 05/18/22   Pennie Banter, DO  furosemide (LASIX) 20 MG tablet Take 1 tablet (20 mg total) by mouth 2 (two) times daily. 05/17/22   Pennie Banter, DO  guaiFENesin (ROBITUSSIN) 100 MG/5ML liquid Take 5 mLs by mouth every 4 (four) hours as needed for cough or to loosen phlegm. 05/17/22   Pennie Banter, DO  lactulose (CHRONULAC) 10 GM/15ML solution Take 15 mLs (10 g total) by mouth every 6 (six) hours as needed for mild constipation or moderate constipation. 05/17/22   Pennie Banter, DO  Multiple Vitamin (MULTI-VITAMINS) TABS Take 1 tablet by mouth daily.    [provider]  nystatin (MYCOSTATIN/NYSTOP) powder Apply 1 Application topically 2 (two) times daily.    [provider]  PHENobarbital (LUMINAL) 64.8 MG tablet Take 64.8 mg by mouth daily.    [provider]  polyethylene glycol (MIRALAX / GLYCOLAX) 17 g packet Take 17 g by mouth daily as needed. 05/17/22   Esaw Grandchild A, DO  potassium chloride SA (KLOR-CON M) 20 MEQ tablet Take 1 tablet (20 mEq total) by mouth 2 (two) times daily. 05/17/22   Esaw Grandchild A, DO  pravastatin (PRAVACHOL) 10 MG tablet Take 10 mg by mouth at bedtime. 04/28/22   [provider]  Suvorexant 5 MG TABS Take 5 mg by mouth at bedtime.    [provider]  umeclidinium bromide (INCRUSE ELLIPTA) 62.5 MCG/ACT AEPB Inhale 1 puff into the lungs daily. 05/18/22   Esaw Grandchild A, DO  zinc oxide 20 % ointment Apply 1 application. topically 2 (two) times daily. (Apply to legs)    [provider]     Critical care time: ***       Cheryll Cockayne Rust-Chester, AGACNP-BC Acute Care Nurse Practitioner [facility]    [phone] / [phone] Please see Amion for details.

## 2023-04-06 ENCOUNTER — Inpatient Hospital Stay: Admit: 2023-04-06 | Payer: Medicare Other

## 2023-04-06 ENCOUNTER — Inpatient Hospital Stay: Payer: Medicare Other

## 2023-04-06 DIAGNOSIS — Z515 Encounter for palliative care: Secondary | ICD-10-CM

## 2023-04-06 DIAGNOSIS — G40909 Epilepsy, unspecified, not intractable, without status epilepticus: Secondary | ICD-10-CM | POA: Diagnosis not present

## 2023-04-06 DIAGNOSIS — A419 Sepsis, unspecified organism: Secondary | ICD-10-CM | POA: Diagnosis not present

## 2023-04-06 DIAGNOSIS — R579 Shock, unspecified: Secondary | ICD-10-CM

## 2023-04-06 DIAGNOSIS — J189 Pneumonia, unspecified organism: Secondary | ICD-10-CM | POA: Diagnosis not present

## 2023-04-06 LAB — COMPREHENSIVE METABOLIC PANEL
ALT: 28 U/L (ref 0–44)
AST: 32 U/L (ref 15–41)
Albumin: 1.9 g/dL — ABNORMAL LOW (ref 3.5–5.0)
Alkaline Phosphatase: 108 U/L (ref 38–126)
Anion gap: 8 (ref 5–15)
BUN: 59 mg/dL — ABNORMAL HIGH (ref 8–23)
CO2: 20 mmol/L — ABNORMAL LOW (ref 22–32)
Calcium: 7.8 mg/dL — ABNORMAL LOW (ref 8.9–10.3)
Chloride: 111 mmol/L (ref 98–111)
Creatinine, Ser: 2.78 mg/dL — ABNORMAL HIGH (ref 0.61–1.24)
GFR, Estimated: 22 mL/min — ABNORMAL LOW (ref >60)
Glucose, Bld: 182 mg/dL — ABNORMAL HIGH (ref 70–99)
Potassium: 5.8 mmol/L — ABNORMAL HIGH (ref 3.5–5.1)
Sodium: 139 mmol/L (ref 135–145)
Total Bilirubin: 0.8 mg/dL (ref 0.3–1.2)
Total Protein: 6.5 g/dL (ref 6.5–8.1)

## 2023-04-06 LAB — HEMOGLOBIN AND HEMATOCRIT, BLOOD
HCT: 20.9 % — ABNORMAL LOW (ref 39.0–52.0)
HCT: 23.4 % — ABNORMAL LOW (ref 39.0–52.0)
Hemoglobin: 6.5 g/dL — ABNORMAL LOW (ref 13.0–17.0)
Hemoglobin: 7.3 g/dL — ABNORMAL LOW (ref 13.0–17.0)

## 2023-04-06 LAB — BLOOD CULTURE ID PANEL (REFLEXED) - BCID2

## 2023-04-06 LAB — GLUCOSE, CAPILLARY
Glucose-Capillary: 139 mg/dL — ABNORMAL HIGH (ref 70–99)
Glucose-Capillary: 201 mg/dL — ABNORMAL HIGH (ref 70–99)
Glucose-Capillary: 157 mg/dL — ABNORMAL HIGH (ref 70–99)
Glucose-Capillary: 184 mg/dL — ABNORMAL HIGH (ref 70–99)
Glucose-Capillary: 203 mg/dL — ABNORMAL HIGH (ref 70–99)

## 2023-04-06 LAB — BLOOD GAS, VENOUS
Acid-base deficit: 6.2 mmol/L — ABNORMAL HIGH (ref 0.0–2.0)
Acid-base deficit: 5.6 mmol/L — ABNORMAL HIGH (ref 0.0–2.0)
Bicarbonate: 19.6 mmol/L — ABNORMAL LOW (ref 20.0–28.0)
Bicarbonate: 19.3 mmol/L — ABNORMAL LOW (ref 20.0–28.0)
FIO2: 40 %
O2 Saturation: 41.7 %
O2 Saturation: 81.1 %
Patient temperature: 37
Patient temperature: 37
pCO2, Ven: 39 mmHg — ABNORMAL LOW (ref 44–60)
pCO2, Ven: 35 mmHg — ABNORMAL LOW (ref 44–60)
pH, Ven: 7.31 (ref 7.25–7.43)
pH, Ven: 7.35 (ref 7.25–7.43)
pO2, Ven: 51 mmHg — ABNORMAL HIGH (ref 32–45)

## 2023-04-06 LAB — CBC
HCT: 24.1 % — ABNORMAL LOW (ref 39.0–52.0)
Hemoglobin: 7.2 g/dL — ABNORMAL LOW (ref 13.0–17.0)
MCH: 26.6 pg (ref 26.0–34.0)
MCHC: 29.9 g/dL — ABNORMAL LOW (ref 30.0–36.0)
MCV: 88.9 fL (ref 80.0–100.0)
Platelets: 395 10*3/uL (ref 150–400)
RBC: 2.71 MIL/uL — ABNORMAL LOW (ref 4.22–5.81)
RDW: 16.7 % — ABNORMAL HIGH (ref 11.5–15.5)
WBC: 13.2 10*3/uL — ABNORMAL HIGH (ref 4.0–10.5)
nRBC: 0 % (ref 0.0–0.2)

## 2023-04-06 LAB — POTASSIUM: Potassium: 4.4 mmol/L (ref 3.5–5.1)

## 2023-04-06 LAB — PROCALCITONIN: Procalcitonin: 7.54 ng/mL

## 2023-04-06 LAB — STREP PNEUMONIAE URINARY ANTIGEN: Strep Pneumo Urinary Antigen: NEGATIVE

## 2023-04-06 LAB — MRSA NEXT GEN BY PCR, NASAL: MRSA by PCR Next Gen: DETECTED — AB

## 2023-04-06 LAB — MAGNESIUM: Magnesium: 2.6 mg/dL — ABNORMAL HIGH (ref 1.7–2.4)

## 2023-04-06 LAB — PHOSPHORUS: Phosphorus: 5.2 mg/dL — ABNORMAL HIGH (ref 2.5–4.6)

## 2023-04-06 LAB — PREPARE RBC (CROSSMATCH)

## 2023-04-06 MED ORDER — PHENOBARBITAL SODIUM 65 MG/ML IJ SOLN
64.8000 mg | Freq: Every day | INTRAMUSCULAR | Status: DC
  Administered 2023-04-06 – 2023-04-07 (×2): 64.8 mg via INTRAVENOUS
  Filled 2023-04-06 (×2): qty 1

## 2023-04-06 MED ORDER — VASOPRESSIN 20 UNITS/100 ML INFUSION FOR SHOCK
0.0000 [IU]/min | INTRAVENOUS | Status: DC
  Administered 2023-04-06 (×2): 0.03 [IU]/min via INTRAVENOUS
  Filled 2023-04-06 (×2): qty 100

## 2023-04-06 MED ORDER — FENTANYL CITRATE PF 50 MCG/ML IJ SOSY
12.5000 ug | PREFILLED_SYRINGE | INTRAMUSCULAR | Status: DC | PRN
  Administered 2023-04-06: 12.5 ug via INTRAVENOUS
  Filled 2023-04-06: qty 1

## 2023-04-06 MED ORDER — BUDESONIDE 0.25 MG/2ML IN SUSP
0.2500 mg | Freq: Two times a day (BID) | RESPIRATORY_TRACT | Status: DC
  Administered 2023-04-06: 0.25 mg via RESPIRATORY_TRACT
  Filled 2023-04-06: qty 2

## 2023-04-06 MED ORDER — SODIUM CHLORIDE 0.9 % IV SOLN
1.0000 g | Freq: Two times a day (BID) | INTRAVENOUS | Status: DC
  Administered 2023-04-07 – 2023-04-10 (×8): 1 g via INTRAVENOUS
  Filled 2023-04-06 (×9): qty 20

## 2023-04-06 MED ORDER — LINEZOLID 600 MG/300ML IV SOLN
600.0000 mg | Freq: Two times a day (BID) | INTRAVENOUS | Status: DC
  Administered 2023-04-06 – 2023-04-07 (×3): 600 mg via INTRAVENOUS
  Filled 2023-04-06 (×4): qty 300

## 2023-04-06 MED ORDER — SODIUM BICARBONATE 8.4 % IV SOLN
50.0000 meq | Freq: Once | INTRAVENOUS | Status: AC
  Administered 2023-04-06: 50 meq via INTRAVENOUS
  Filled 2023-04-06: qty 50

## 2023-04-06 MED ORDER — SODIUM CHLORIDE 0.9% IV SOLUTION: Freq: Once | INTRAVENOUS | Status: AC

## 2023-04-06 MED ORDER — INSULIN ASPART 100 UNIT/ML IJ SOLN: 0.0000 [IU] | Freq: Three times a day (TID) | INTRAMUSCULAR | Status: DC

## 2023-04-06 MED ORDER — MEDIHONEY WOUND/BURN DRESSING EX PSTE
1.0000 | PASTE | Freq: Every day | CUTANEOUS | Status: DC
  Administered 2023-04-06 – 2023-04-13 (×8): 1 via TOPICAL
  Filled 2023-04-06: qty 44

## 2023-04-06 MED ORDER — UMECLIDINIUM BROMIDE 62.5 MCG/ACT IN AEPB
1.0000 | INHALATION_SPRAY | Freq: Every day | RESPIRATORY_TRACT | Status: DC
  Administered 2023-04-06 – 2023-04-13 (×8): 1 via RESPIRATORY_TRACT
  Filled 2023-04-06: qty 7

## 2023-04-06 MED ORDER — SODIUM ZIRCONIUM CYCLOSILICATE 5 G PO PACK
10.0000 g | PACK | Freq: Once | ORAL | Status: AC
  Administered 2023-04-06: 10 g via ORAL
  Filled 2023-04-06: qty 2

## 2023-04-06 MED ORDER — PANTOPRAZOLE SODIUM 40 MG IV SOLR
40.0000 mg | INTRAVENOUS | Status: DC
  Administered 2023-04-06 – 2023-04-07 (×2): 40 mg via INTRAVENOUS
  Filled 2023-04-06 (×2): qty 10

## 2023-04-06 MED ORDER — MUPIROCIN 2 % EX OINT
1.0000 | TOPICAL_OINTMENT | Freq: Two times a day (BID) | CUTANEOUS | Status: AC
  Administered 2023-04-06 – 2023-04-10 (×10): 1 via NASAL
  Filled 2023-04-06: qty 22

## 2023-04-06 MED ORDER — INSULIN ASPART 100 UNIT/ML IV SOLN
10.0000 [IU] | Freq: Once | INTRAVENOUS | Status: AC
  Administered 2023-04-06: 10 [IU] via INTRAVENOUS
  Filled 2023-04-06: qty 0.1

## 2023-04-06 MED ORDER — ENOXAPARIN SODIUM 100 MG/ML IJ SOSY
1.0000 mg/kg | PREFILLED_SYRINGE | INTRAMUSCULAR | Status: DC
  Administered 2023-04-06: 92.5 mg via SUBCUTANEOUS
  Filled 2023-04-06: qty 1

## 2023-04-06 MED ORDER — IPRATROPIUM-ALBUTEROL 0.5-2.5 (3) MG/3ML IN SOLN
3.0000 mL | Freq: Two times a day (BID) | RESPIRATORY_TRACT | Status: DC
  Administered 2023-04-06 – 2023-04-09 (×7): 3 mL via RESPIRATORY_TRACT
  Filled 2023-04-06 (×7): qty 3

## 2023-04-06 MED ORDER — HYDROCORTISONE SOD SUC (PF) 100 MG IJ SOLR
100.0000 mg | Freq: Three times a day (TID) | INTRAMUSCULAR | Status: DC
  Administered 2023-04-06: 100 mg via INTRAVENOUS
  Filled 2023-04-06: qty 2

## 2023-04-06 MED ORDER — CALCIUM GLUCONATE-NACL 1-0.675 GM/50ML-% IV SOLN
1.0000 g | Freq: Once | INTRAVENOUS | Status: AC
  Administered 2023-04-06: 1000 mg via INTRAVENOUS
  Filled 2023-04-06: qty 50

## 2023-04-06 MED ORDER — SODIUM CHLORIDE 0.9 % IV SOLN
250.0000 mL | INTRAVENOUS | Status: DC
  Administered 2023-04-06: 250 mL via INTRAVENOUS

## 2023-04-06 MED ORDER — ALUM & MAG HYDROXIDE-SIMETH 200-200-20 MG/5ML PO SUSP
30.0000 mL | Freq: Once | ORAL | Status: AC
  Administered 2023-04-06: 30 mL via ORAL
  Filled 2023-04-06: qty 30

## 2023-04-06 MED ORDER — DEXTROSE 50 % IV SOLN
1.0000 | Freq: Once | INTRAVENOUS | Status: AC
  Administered 2023-04-06: 50 mL via INTRAVENOUS
  Filled 2023-04-06: qty 50

## 2023-04-06 MED ORDER — CHLORHEXIDINE GLUCONATE CLOTH 2 % EX PADS
6.0000 | MEDICATED_PAD | Freq: Every day | CUTANEOUS | Status: AC
  Administered 2023-04-06 – 2023-04-10 (×5): 6 via TOPICAL

## 2023-04-06 MED ORDER — SODIUM CHLORIDE 0.9 % IV SOLN
3.0000 g | Freq: Two times a day (BID) | INTRAVENOUS | Status: DC
  Administered 2023-04-06 (×2): 3 g via INTRAVENOUS
  Filled 2023-04-06 (×3): qty 8

## 2023-04-06 MED ORDER — ENOXAPARIN SODIUM 100 MG/ML IJ SOSY
1.0000 mg/kg | PREFILLED_SYRINGE | INTRAMUSCULAR | Status: DC
  Filled 2023-04-06: qty 1

## 2023-04-06 MED ORDER — INSULIN ASPART 100 UNIT/ML IJ SOLN
0.0000 [IU] | Freq: Every day | INTRAMUSCULAR | Status: DC
  Administered 2023-04-06: 2 [IU] via SUBCUTANEOUS
  Filled 2023-04-06: qty 1

## 2023-04-06 MED ORDER — FLUTICASONE FUROATE-VILANTEROL 100-25 MCG/ACT IN AEPB
1.0000 | INHALATION_SPRAY | Freq: Every day | RESPIRATORY_TRACT | Status: DC
  Administered 2023-04-06 – 2023-04-13 (×8): 1 via RESPIRATORY_TRACT
  Filled 2023-04-06: qty 28

## 2023-04-06 NOTE — Progress Notes (Signed)
eLink Physician-Brief Progress Note Patient Name: [name] DOB: 1941-05-06 MRN: [id number]   Date of Service  04/06/2023  HPI/Events of Note  82 year old male with a history of COPD, CKD, and heart failure with presented to the emergency department for evaluation of hypoxemia with saturation in the 80s after recent admission in May with severe sepsis secondary to urinary tract infection.  On presentation, she is tachypneic, febrile, hypotensive requiring norepinephrine infusion saturating 100% on the nonrebreather.  Chronic Foley and ostomy in place.  Treated with broad-spectrum antibiotics with Levaquin, vancomycin.  Metabolic panel consistent with none anion gap metabolic acidosis, elevated creatinine, and VBG shows adequate ventilation  CT chest with multifocal airspace disease concerning for pneumonia.  eICU Interventions  Being treated with hydrocortisone for severe pneumonia, maintain broad-spectrum antibiotics, trend procalcitonin and scheduled SVNs  Currently on BiPAP for ventilatory support and increased work of breathing.  Norepinephrine as needed to maintain MAP greater than 65  Low threshold to discontinue bicarb infusion, unclear indication.  DVT prophylaxis with enoxaparin (question therapeutic). No indication for GI prophylaxis     Intervention Category Evaluation Type: New Patient Evaluation  Makia Bossi 04/06/2023, 12:05 AM

## 2023-04-06 NOTE — Progress Notes (Signed)
CXR revieweed by Rust-Chester, NP s/p placement of CVC. Verbal order received that CVC is "okay to use".

## 2023-04-06 NOTE — Progress Notes (Signed)
PHARMACY - PHYSICIAN COMMUNICATION CRITICAL VALUE ALERT - BLOOD CULTURE IDENTIFICATION (BCID)  [name] is an 82 y.o. male who presented to [facility] on 04/05/2023 with a chief complaint of sepsis, sacral wound infection, CKD.   Assessment:  Kleb pneumo in 1 of 4 bottles, CTX resitance detected.  (include suspected source if known). Hx of VRE in urine cx,  MRSA PCR +  Name of physician (or Provider) Contacted: Cheryll Cockayne Andrey Farmer, NP   Current antibiotics: Unasyn, Zyvox  Changes to prescribed antibiotics recommended:  Recommendations accepted by provider Will d/c Unasyn and start meropenem 1 gm IV Q12H. Will continue Zyvox for now , allow AM staff to decide on whether or not to continue Zyvox.   Results for orders placed or performed during the hospital encounter of 04/05/23  Blood Culture ID Panel (Reflexed) (Collected: 04/05/2023  7:04 PM)  Result Value Ref Range   Enterococcus faecalis NOT DETECTED NOT DETECTED   Enterococcus Faecium NOT DETECTED NOT DETECTED   Listeria monocytogenes NOT DETECTED NOT DETECTED   Staphylococcus species NOT DETECTED NOT DETECTED   Staphylococcus aureus (BCID) NOT DETECTED NOT DETECTED   Staphylococcus epidermidis NOT DETECTED NOT DETECTED   Staphylococcus lugdunensis NOT DETECTED NOT DETECTED   Streptococcus species NOT DETECTED NOT DETECTED   Streptococcus agalactiae NOT DETECTED NOT DETECTED   Streptococcus pneumoniae NOT DETECTED NOT DETECTED   Streptococcus pyogenes NOT DETECTED NOT DETECTED   A.calcoaceticus-baumannii NOT DETECTED NOT DETECTED   Bacteroides fragilis NOT DETECTED NOT DETECTED   Enterobacterales DETECTED (A) NOT DETECTED   Enterobacter cloacae complex NOT DETECTED NOT DETECTED   Escherichia coli NOT DETECTED NOT DETECTED   Klebsiella aerogenes NOT DETECTED NOT DETECTED   Klebsiella oxytoca NOT DETECTED NOT DETECTED   Klebsiella pneumoniae DETECTED (A) NOT DETECTED   Proteus species NOT DETECTED NOT DETECTED    Salmonella species NOT DETECTED NOT DETECTED   Serratia marcescens NOT DETECTED NOT DETECTED   Haemophilus influenzae NOT DETECTED NOT DETECTED   Neisseria meningitidis NOT DETECTED NOT DETECTED   Pseudomonas aeruginosa NOT DETECTED NOT DETECTED   Stenotrophomonas maltophilia NOT DETECTED NOT DETECTED   Candida albicans NOT DETECTED NOT DETECTED   Candida auris NOT DETECTED NOT DETECTED   Candida glabrata NOT DETECTED NOT DETECTED   Candida krusei NOT DETECTED NOT DETECTED   Candida parapsilosis NOT DETECTED NOT DETECTED   Candida tropicalis NOT DETECTED NOT DETECTED   Cryptococcus neoformans/gattii NOT DETECTED NOT DETECTED   CTX-M ESBL DETECTED (A) NOT DETECTED   Carbapenem resistance IMP NOT DETECTED NOT DETECTED   Carbapenem resistance KPC NOT DETECTED NOT DETECTED   Carbapenem resistance NDM NOT DETECTED NOT DETECTED   Carbapenem resist OXA 48 LIKE NOT DETECTED NOT DETECTED   Carbapenem resistance VIM NOT DETECTED NOT DETECTED    Anyela Napierkowski D 04/06/2023  9:48 PM

## 2023-04-06 NOTE — Progress Notes (Signed)
*  PRELIMINARY RESULTS* Echocardiogram 2D Echocardiogram has been performed.  Fate, Mccrary 04/06/2023, 8:59 AM

## 2023-04-06 NOTE — Procedures (Signed)
Central Venous Catheter Insertion Procedure Note  ARLYNN VEASLEY  [id number]  1940/09/07  Date:04/06/23  Time:1:35 AM   Provider Performing:Rossanna Spitzley L Rust-Chester   Procedure: Insertion of Non-tunneled Central Venous Catheter(36556) with US guidance (40981)   Indication(s) Medication administration  Consent Unable to obtain consent due to emergent nature of procedure.  Anesthesia Topical only with 1% lidocaine   Timeout Verified patient identification, verified procedure, site/side was marked, verified correct patient position, special equipment/implants available, medications/allergies/relevant history reviewed, required imaging and test results available.  Sterile Technique Maximal sterile technique including full sterile barrier drape, hand hygiene, sterile gown, sterile gloves, mask, hair covering, sterile ultrasound probe cover (if used).  Procedure Description Area of catheter insertion was cleaned with chlorhexidine and draped in sterile fashion.  With real-time ultrasound guidance a central venous catheter was placed into the left internal jugular vein. Nonpulsatile blood flow and easy flushing noted in all ports.  The catheter was sutured in place and sterile dressing applied.  Complications/Tolerance None; patient tolerated the procedure well. Chest X-ray is ordered to verify placement for internal jugular or subclavian cannulation.   Chest x-ray is not ordered for femoral cannulation.  EBL Minimal  Specimen(s) None   Betsey Holiday, AGACNP-BC Acute Care Nurse Practitioner [facility]   [phone] / [phone] Please see Amion for details.

## 2023-04-06 NOTE — Evaluation (Addendum)
Clinical/Bedside Swallow Evaluation Patient Details  Name: [name] MRN: [id number] Date of Birth: April 07, 1941  Today's Date: 04/06/2023 Time: SLP Start Time (ACUTE ONLY): 4010 SLP Stop Time (ACUTE ONLY): 0850 SLP Time Calculation (min) (ACUTE ONLY): 55 min  Past Medical History:  Past Medical History:  Diagnosis Date   Bipolar 1 disorder (HCC)    C. difficile diarrhea 05/2016   history of ...   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   Schizophrenia (HCC)    Seizure (HCC)    on phenobarb   Urinary retention    frequently requiring foley placement   VRE (vancomycin resistant enterococcus) culture positive    urine   Past Surgical History:  Past Surgical History:  Procedure Laterality Date   COLECTOMY WITH COLOSTOMY CREATION/HARTMANN PROCEDURE N/A 05/12/2022   Procedure: PARTIAL COLECTOMY WITH COLOSTOMY CREATION/HARTMANN PROCEDURE;  Surgeon: Carolan Shiver, MD;  Location: [facility];  Service: General;  Laterality: N/A;   COLONOSCOPY WITH PROPOFOL N/A 05/09/2022   Procedure: COLONOSCOPY WITH PROPOFOL;  Surgeon: Jaynie Collins, DO;  Location: [facility];  Service: Gastroenterology;  Laterality: N/A;   EXTRACORPOREAL SHOCK WAVE LITHOTRIPSY Right 05/05/2017   Procedure: EXTRACORPOREAL SHOCK WAVE LITHOTRIPSY (ESWL);  Surgeon: Vanna Scotland, MD;  Location: [facility];  Service: Urology;  Laterality: Right;   IR CATHETER TUBE CHANGE  06/23/2017   IR CATHETER TUBE CHANGE  06/28/2017   IVC FILTER INSERTION N/A 05/11/2022   Procedure: IVC FILTER INSERTION;  Surgeon: Renford Dills, MD;  Location: [facility];   Service: Cardiovascular;  Laterality: N/A;   LAPAROTOMY N/A 05/12/2022   Procedure: EXPLORATORY LAPAROTOMY;  Surgeon: Carolan Shiver, MD;  Location: [facility];  Service: General;  Laterality: N/A;   HPI:  Pt is a 82 yo male w/ medical history significant of Dementia, Dysphagia per MBSS in 01/2022 on Honey consistency liquids d/t Silent aspiration of thin liquids, hypertension, hyperlipidemia, 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 difficile colitis, kidney stone, BPH, CKD-3A, obesity, positive VRE urine culture in past, presenting to the emergency department for evaluation of hypoxia.  Per EMS report, patient had an oxygen saturation in the 80s since yesterday at his Facility.  He arrives on a nonrebreather.  Does not wear oxygen at baseline.  Has a chronic Foley catheter as well as an ostomy.  Patient able to tell me his name, unable to provide other history, does have documented history of Dementia.  Imaging: Asymmetric left lung patchy and confluent opacities, suspicious for pneumonia. MD w/ concern for aspiration pneumonia per chart notes.     Assessment / Plan / Recommendation  Clinical Impression   Pt seen for BSE today. Pt awake, verbal but min slow to respond at times. Answers a few basic questions but suspect Cognitive decline. Pt appears weak and requires MAX support w/ positioning and feeding.  On [place] O2 support 2L; afebrile now. WBC elevated.   Pt appears to present w/ oropharyngeal phase dysphagia w/ Neuromuscular deficits and known delay in pharyngeal swallow initiation resulting in SILENT  aspiration of thin and Nectar consistency liquids -- see the H&P part of note above; and MBSS report 01/2022. ALSO noted impact from Cognitive decline resulting in decreased awareness/insight of his medical status and risk for aspiration/aspiration pneumonia. Pt has dx of Dementia per chart.  Noted all motor  movements and verbal responses were slightly slow --  suspect in setting of his medical and pschiatric dxs, medications(this was noted in 01/2022). Pt required MOD+ tactile/verbal/visual cues for follow through w/ tasks.     Pt consumed TSP trials of Honey consistency liquids and Puree foods w/ no immediate, overt clinical s/s of aspiration noted; no immediate cough and no decline in respiratory presentation noted during/post trials. O2 sats remained 96%. Clear vocal quality noted b/t trials. Though, 1 mildly congested cough much delayed b/t trials of Honey consistency liquid trials similar to premorbid coughing PRIOR TO po's given(but unsure if caused by pharyngeal residue?). Multiple swallows were noted intermittently as well as slightly decreased hyolaryngeal excursion to palpation. Suspect pharyngeal swallow delay based on prior objective swallow study results(01/2022). Oral phase was c/b min slow, deliberate bolus management and oral clearing of all boluses given. A-P transfer was functional w/ Honey liquids and purees. OM exam was grossly [facility] w/ no lingual weakness noted; no labial/facial asymmetry. Pt required feeding support.    In setting of Baseline Dysphagia w/ dysphagia diet recommended s/p MBSS, his increased risk for aspiration/aspiration pneumonia, medical decline, hospitalization, and sedentary status, recommend dysphagia level 1 diet(puree) w/ Honey consistency liquids via TSP; strict aspiration precautions; Pills Crushed in puree for safety; tray setup at meals; Supervision/monitoring at meals; reduce Distractions during meals; feeding support. NSG/MD updated w/ plan for trial diet.  ST services will f/u w/ toleration of diet next 1-2 days; recommend Palliative Care consult for GOC and direction moving forward. MD agreed. SLP Visit Diagnosis: Dysphagia, oropharyngeal phase (R13.12) (baseline Dysphagia, Dementia)    Aspiration Risk  Severe aspiration risk;Risk for inadequate  nutrition/hydration    Diet Recommendation   Honey;Dysphagia 1 (puree) = ysphagia level 1 diet(puree) w/ Honey consistency liquids via TSP; strict aspiration precautions; tray setup at meals; Supervision/monitoring at meals; reduce Distractions during meals; feeding support. Oral care.  Medication Administration: Crushed with puree    Other  Recommendations Recommended Consults:  (Palliative Care consult; Dietician f/u) Oral Care Recommendations: Oral care BID;Oral care before and after PO;Staff/trained caregiver to provide oral care Caregiver Recommendations: Avoid jello, ice cream, thin soups, popsicles;Remove water pitcher;Have oral suction available    Recommendations for follow up therapy are one component of a multi-disciplinary discharge planning process, led by the attending physician.  Recommendations may be updated based on patient status, additional functional criteria and insurance authorization.  Follow up Recommendations Skilled nursing-short term rehab (<3 hours/day)      Assistance Recommended at Discharge  FULL  Functional Status Assessment Patient has had a recent decline in their functional status and/or demonstrates limited ability to make significant improvements in function in a reasonable and predictable amount of time  Frequency and Duration min 1 x/week  1 week       Prognosis Prognosis for improved oropharyngeal function: Guarded Barriers to Reach Goals: Cognitive deficits;Time post onset;Severity of deficits;Language deficits Barriers/Prognosis Comment: baseline Dementia, dysphagia      Swallow Study   General Date of Onset: 04/05/23 HPI: Pt is a 82 yo male w/ medical history significant of Dementia, Dysphagia per MBSS in 01/2022 on Honey consistency liquids d/t Silent aspiration of thin liquids, hypertension, hyperlipidemia, decubidus  ulcer on his buttocks, COPD, GERD, depression, Bipolar disorder, Schizophrenia, seizure, DVT not on anticoagulants, dCHF, C.  difficile colitis, kidney stone, BPH, CKD-3A, obesity, positive VRE urine culture in past, presenting to the emergency department for evaluation of hypoxia.  Per EMS report, patient had an oxygen saturation in the 80s since yesterday at his Facility.  He arrives on a nonrebreather.  Does not wear oxygen at baseline.  Has a chronic Foley catheter as well as an ostomy.  Patient able to tell me his name, unable to provide other history, does have documented history of Dementia. Type of Study: Bedside Swallow Evaluation Previous Swallow Assessment: 01/2022; 04/2022 Diet Prior to this Study: NPO Temperature Spikes Noted: No (wbc 13.2) Respiratory Status: Nasal cannula (2L) History of Recent Intubation: No Behavior/Cognition: Alert;Cooperative;Pleasant mood;Confused;Distractible;Requires cueing Oral Cavity Assessment: Within Functional Limits Oral Care Completed by SLP: Yes Oral Cavity - Dentition: Edentulous Vision:  (n/a) Self-Feeding Abilities: Total assist Patient Positioning: Upright in bed (total assist) Baseline Vocal Quality: Normal;Low vocal intensity Volitional Cough: Cognitively unable to elicit (involuntary cough was fair, congested prior to po's) Volitional Swallow: Unable to elicit    Oral/Motor/Sensory Function Overall Oral Motor/Sensory Function: Generalized oral weakness (but no unilateral weakness)   Ice Chips Ice chips: Not tested   Thin Liquid Thin Liquid: Not tested    Nectar Thick Nectar Thick Liquid: Not tested   Honey Thick Honey Thick Liquid: Impaired (min) Presentation: Spoon (13+) Oral Phase Impairments:  (WFL) Pharyngeal Phase Impairments: Suspected delayed Swallow;Decreased hyoid-laryngeal movement;Multiple swallows (intermittently) Other Comments: much delayed cough   Puree Puree: Impaired Presentation: Spoon (10 trials) Oral Phase Impairments:  (wfl) Pharyngeal Phase Impairments: Suspected delayed Swallow;Decreased hyoid-laryngeal movement;Multiple swallows  (intermittently) Other Comments: much delayed cough   Solid     Solid: Not tested        Jerilynn Som, MS, CCC-SLP Speech Language Pathologist Rehab Services; [facility] [place] [phone] (ascom) Hanan Moen 04/06/2023,2:16 PM

## 2023-04-06 NOTE — Progress Notes (Signed)
Pharmacy Antibiotic Note  [name] is a 82 y.o. 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  BCID showing Kleb pneumo in 1 of 4 bottles, CTX resistance detected.  Pharmacy has been consulted for meropenem dosing.  Plan: Will d/c unasyn and start Meropenem 1 gm IV Q12H on 8/28 @ 2200.  - Will continue zyvox for now ; allow AM staff to decide whether or not to continue zyvox.   Height: 5\' 10"  (177.8 cm) Weight: 91.2 kg (201 lb 1 oz) IBW/kg (Calculated) : 73  Temp (24hrs), Avg:98 F (36.7 C), Min:97.6 F (36.4 C), Max:98.2 F (36.8 C)  Recent Labs  Lab 04/05/23 1904 04/05/23 2114 04/05/23 2241 04/06/23 0414  WBC 13.0*  --   --  13.2*  CREATININE 2.90* 2.46*  --  2.78*  LATICACIDVEN 1.6  --  0.8  --     Estimated Creatinine Clearance: 24.1 mL/min (A) (by C-G formula based on SCr of 2.78 mg/dL (H)).    Allergies  Allergen Reactions   Penicillin G Rash and Other (See Comments)    Has tolerated cephalosporins, zosyn, and augmentin    Antimicrobials this admission:   >>    >>   Dose adjustments this admission:   Microbiology results:  BCx:   UCx:    Sputum:    MRSA PCR:   Thank you for allowing pharmacy to be a part of this patient's care.  Odie Edmonds D 04/06/2023 9:58 PM

## 2023-04-06 NOTE — Consult Note (Signed)
PHARMACY CONSULT NOTE  Pharmacy Consult for Electrolyte Monitoring and Replacement   Recent Labs: Potassium (mmol/L)  Date Value  04/06/2023 5.8 (H)  10/03/2012 3.1 (L)   Magnesium (mg/dL)  Date Value  40/98/1191 2.6 (H)  10/02/2012 1.3 (L)   Calcium (mg/dL)  Date Value  47/82/9562 7.8 (L)   Calcium, Total (mg/dL)  Date Value  13/03/6577 8.2 (L)   Albumin (g/dL)  Date Value  46/96/2952 1.9 (L)  10/02/2012 3.4   Phosphorus (mg/dL)  Date Value  84/13/2440 5.2 (H)   Sodium (mmol/L)  Date Value  04/06/2023 139  10/03/2012 141   Assessment: 82 y/o M with medical history including bipolar disorder, schizophrenia, C diff (2017), CKD, COPD, DVT, GERD, HLD, seizure d/o, HTN, HFpEF, PE on apixaban, BPH with chronic foley, stage II pressure injury admitted with shock and acute respiratory failure.   Goal of Therapy:  Electrolytes within normal limits  Plan:  --5.8, Lokelma 10 g PO x 1 dose --Re-check K+ at 1600 and all electrolytes again tomorrow AM  Tressie Ellis 04/06/2023 9:25 AM

## 2023-04-06 NOTE — Consult Note (Signed)
Consultation Note Date: 04/06/2023 at 1230  Patient Name: [name]  DOB: Feb 10, 1941  MRN: [id number]  Age / Sex: 82 y.o., male  PCP: System, Provider Not In Referring Physician: Vida Rigger, MD  Reason for Consultation: Establishing goals of care  HPI/Patient Profile: 82 y.o. male  with past medical history of bipolar disorder, schizophrenia, CKD, COPD, GERD, HLD, seizure disorder, HTN, HFpEF, PE (Eliquis), BPH (chronic Foley, and pressure injuries admitted on 04/05/2023 with hypoxia.   Patient is being treated for septic shock due to soft tissue infection, left lung pneumonia as well as chronic pressure ulcers of sacrum/scrotum and CKD.  PMT was consulted to clarify goals of care.  Clinical Assessment and Goals of Care: I have reviewed medical records including EPIC notes, labs and imaging, assessed the patient and then met with patient at bedside to discuss diagnosis prognosis, GOC, EOL wishes, disposition and options.  I introduced Palliative Medicine as specialized medical care for people living with serious illness. It focuses on providing relief from the symptoms and stress of a serious illness. The goal is to improve quality of life for both the patient and the family.  We discussed a brief life review of the patient.  Patient shares she is originally from [place], is an only child, was never married, and has no children.  He believes his parents are still alive in [place].  However, he has no phone number for them and has not spoken to them in a long time.  He endorses he was in the [employer] but cannot recall what he did for the majority of his working career.  I attempted to gauge patient's current understanding of his medical situation.  He endorses that he is not clear on why he is sick or why he is here.  Brief medical update given.  Patient was not able to confirm his understanding using the  teach back method.  I asked patient who is in his inner circle and who helps support him in his life.  He says he does not have anybody and he would like to have someone.  I broached the topic of a guardian to establish a decision maker for hi. Patient nodded in agreement and said that sounded good to himPatient does not have capacity at this moment to participate in medical decision making or goals of care discussions.  TOC following to help determine next of kin decision maker.  Symptoms assessed.  Patient denies discomfort or pain at this time.  He shares he is hungry and would like to enjoy the food that someone brought him.  I assisted him with taking sips of his honey thickened liquid.  No overt signs of aspiration noted.  Patient did not choke or cough during our visit. No adjustment to [facility] needed at this time.   I counseled with NP Elvina Sidle and attending Dr. Karna Christmas in regards to hierarchy of decision makers for patients who are unable to speak for themselves. I outlined that [place] law dictates a specific order of  decision makers. The following persons, in the order indicated, are authorized to consent to medical treatment on behalf of a patient who is comatose or otherwise lacks capacity to make or communicate health care decisions:  1) Guardian or HCPOA 2) An appointed health care agent pursuant of a valid HCPOA 3) An agent appointed by the patient 4) Patient's spouse 5) Majority of patient's reasonably available parents and children who are at least 45 years of age 25) Majority of patient's reasonably available siblings who are at least 81 years of age 85) An individual who has an established relationship with the patient, who is acting in good faith on behalf of the patient, and who can reliably convey the patient's wishes. 8) If none of the above are available, the patient's attending physician may provide medical treatment to the patient without patient's consent if another physician  confirms patient's condition and necessity for medical treatment provided. However, this confirmation by a second physician is not required if delay caused by obtaining confirmation would endanger the patient's life or seriously worsen the patient's condition.  Patient does not appear to be actively dying or approaching end-of-life.  Therefore, I do not believe emergent need for decision maker exists and seeking guardianship is appropriate.   I am unable to complete goals of care discussions at this time.  Once decision maker is established, PMT can reengage to help clarify goals and boundaries of care.  Primary Decision Maker TBD  Physical Exam Vitals reviewed.  Constitutional:      General: He is not in acute distress.    Appearance: He is normal weight.  HENT:     Head: Normocephalic.     Mouth/Throat:     Mouth: Mucous membranes are moist.  Eyes:     Pupils: Pupils are equal, round, and reactive to light.  Pulmonary:     Effort: Pulmonary effort is normal.  Abdominal:     Palpations: Abdomen is soft.  Musculoskeletal:     Comments: Generalized weakness  Skin:    General: Skin is warm and dry.     Coloration: Skin is pale.  Neurological:     Mental Status: He is alert.     Comments: Oriented to self and place  Psychiatric:        Mood and Affect: Mood normal.        Judgment: Judgment normal.     Palliative Assessment/Data: 30%     Thank you for this consult. Palliative medicine will continue to follow and assist holistically.   Time Total: 75 minutes  Signed by: Georgiann Cocker, DNP, FNP-BC Palliative Medicine    Please contact Palliative Medicine Team phone at [phone] for questions and concerns.  For individual provider: See Loretha Stapler

## 2023-04-06 NOTE — TOC CM/SW Note (Signed)
Cm trying to find Next of Kin or Legal Guardian documented in chart. Pt is a long term resident of [facility]. Cm called Gina of [facility] and she is trying to find next of kin information.  Almira Coaster stated they did not have any next of kin information. Pt was a long term resident of [facility] and he came from a group home. Group Home Owner Achilles Dunk [phone] stated that pt needed a guardian a when he sent pt to [facility]. He will look through his files to see if there is any next of kin in the paperwork.  Cm check care everywhere and called [facility] provider Reesa Chew and office stated the did not have any information regarding client because he wasn't seen by them.  Cm called [facility] and rep stated pt hasn't been there since 2018 and they do not have any records of next of kin.  Cm called [facility] in [place] and they do not have any phone numbers for pt.  CM called [facility] APS Supervisor [place]. She stated that a APS request was made back in 2017 stating that pt only living relatives are his step siblings in [place] who he has no contact information on.  Roseanne Reno stated that cm could make a CPS report and there has to be a court date set to take guardianship and it could take several months for a court date.  [facility] DSS can complete a protective order on pt. However a protective order DOES NOT have the authority to make any End of Life or medical decisions at this time.   Cm notified medical team

## 2023-04-06 NOTE — Progress Notes (Addendum)
ANTICOAGULATION CONSULT NOTE - Initial Consult  Pharmacy Consult for Lovenox  Indication:  Hx of PE   Allergies  Allergen Reactions   Penicillin G Rash and Other (See Comments)    Has tolerated cephalosporins, zosyn, and augmentin    Patient Measurements: Height: 5\' 10"  (177.8 cm) Weight: 91.3 kg (201 lb 4.5 oz) IBW/kg (Calculated) : 73 Heparin Dosing Weight:   Vital Signs: Temp: 98.1 F (36.7 C) (08/27 2345) Temp Source: Axillary (08/27 2345) BP: 90/44 (08/28 0015) Pulse Rate: 77 (08/28 0015)  Labs: Recent Labs    04/05/23 1904 04/05/23 2114  HGB 8.8*  --   HCT 29.9*  --   PLT 470*  --   LABPROT 19.4*  --   INR 1.6*  --   CREATININE 2.90* 2.46*    Estimated Creatinine Clearance: 27.2 mL/min (A) (by C-G formula based on SCr of 2.46 mg/dL (H)).   Medical History: Past Medical History:  Diagnosis Date   Bipolar 1 disorder (HCC)    C. difficile diarrhea 05/2016   history of ...   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   Schizophrenia (HCC)    Seizure (HCC)    on phenobarb   Urinary retention    frequently requiring foley placement   VRE (vancomycin resistant enterococcus) culture positive    urine    Medications:  Medications Prior to Admission  Medication Sig Dispense Refill Last Dose   ammonium lactate (AMLACTIN) 12 % cream Apply 1 Application topically every 12 (twelve) hours.   04/04/2023 at 1050   ARIPiprazole (ABILIFY) 20 MG tablet Take 20 mg by mouth in the morning.   04/05/2023 at 1050   ascorbic acid (VITAMIN C) 500 MG tablet Take 1 tablet (500 mg total) by mouth 2 (two) times daily.   04/05/2023 at 1050    buPROPion (BUDEPRION XL) 300 MG 24 hr tablet Take 1 tablet (300 mg total) by mouth daily. 30 tablet 0 04/05/2023 at 1050   carvedilol (COREG) 25 MG tablet Take 25 mg by mouth 2 (two) times daily.   04/05/2023 at 1050   cetirizine (ZYRTEC) 10 MG tablet Take 10 mg by mouth in the morning.   04/05/2023 at 1050   cholecalciferol (VITAMIN D3) 25 MCG (1000 UNIT) tablet Take 1,000 Units by mouth in the morning.   04/05/2023 at 1050   Cranberry 250 MG CAPS Take 250 mg by mouth 2 (two) times daily.   04/05/2023 at 1050   ELIQUIS 2.5 MG TABS tablet Take 2.5 mg by mouth 2 (two) times daily.   04/05/2023 at 1050   ferrous sulfate 325 (65 FE) MG tablet Take 325 mg by mouth daily with breakfast.   04/05/2023 at 1050   finasteride (PROSCAR) 5 MG tablet Take 1 tablet (5 mg total) by mouth daily. 30 tablet 0 04/05/2023 at 1050   fluticasone furoate-vilanterol (BREO ELLIPTA) 100-25 MCG/ACT AEPB Inhale 1 puff into the lungs daily.   04/05/2023   furosemide (LASIX) 20 MG tablet Take 1 tablet (20 mg total) by mouth 2 (two) times daily. 30 tablet  04/05/2023 at 1310  gabapentin (NEURONTIN) 100 MG capsule Take 100 mg by mouth 3 (three) times daily.   04/05/2023 at 1310   guaiFENesin (ROBITUSSIN) 100 MG/5ML liquid Take 5 mLs by mouth every 4 (four) hours as needed for cough or to loosen phlegm. 120 mL 0 Past Month   HYDROcodone-acetaminophen (NORCO/VICODIN) 5-325 MG tablet Take 1 tablet by mouth every 4 (four) hours as needed.   04/04/2023 at 1750   ipratropium (ATROVENT) 0.02 % nebulizer solution Take 0.25 mg by nebulization every 4 (four) hours as needed.   Past Month   Multiple Vitamin (MULTI-VITAMINS) TABS Take 1 tablet by mouth daily.   04/05/2023 at 1050   nystatin (MYCOSTATIN/NYSTOP) powder Apply 1 Application topically 2 (two) times daily.   04/05/2023 at 1050   PHENobarbital (LUMINAL) 64.8 MG tablet Take 64.8 mg by mouth daily.   04/05/2023 at 1050   potassium chloride SA (KLOR-CON M) 20 MEQ tablet Take 1 tablet (20 mEq total)  by mouth 2 (two) times daily.   04/05/2023 at 1050   pravastatin (PRAVACHOL) 10 MG tablet Take 10 mg by mouth at bedtime.   04/04/2023 at 1920   Suvorexant 5 MG TABS Take 5 mg by mouth at bedtime.   04/04/2023 at 1920   umeclidinium bromide (INCRUSE ELLIPTA) 62.5 MCG/ACT AEPB Inhale 1 puff into the lungs daily.   04/05/2023   zinc oxide 20 % ointment Apply 1 application. topically 2 (two) times daily. (Apply to legs)   04/05/2023 at 1050   albuterol (VENTOLIN HFA) 108 (90 Base) MCG/ACT inhaler Inhale 2 puffs into the lungs every 6 (six) hours as needed for wheezing or shortness of breath.   prn at prn   lactulose (CHRONULAC) 10 GM/15ML solution Take 15 mLs (10 g total) by mouth every 6 (six) hours as needed for mild constipation or moderate constipation. 236 mL 0 prn at prn   polyethylene glycol (MIRALAX / GLYCOLAX) 17 g packet Take 17 g by mouth daily as needed. 14 each 0 prn at prn    Assessment: Pharmacy consulted to dose lovenox in this 82 year old male admitted with CHF exacerbation, PNA.  Was on Eliquis 2.5 mg PO BID PTA for hx of PE.  Last dose on 8/27 AM. CrCl = 27.1 ml/min   Goal of Therapy:  Prevention of PE Monitor platelets by anticoagulation protocol: Yes   Plan:  Lovenox 92.5 mg SQ Q24H ordered to start on 8/28 @ ~ 0100. - check CBC daily   Rahmir Beever D 04/06/2023,12:32 AM

## 2023-04-06 NOTE — Consult Note (Signed)
WOC Nurse Consult Note: Reason for Consult: pressure injury buttock and scrotum Patient from [facility], conflicting information, however after review WOC nursing has seen patient last year after new ostomy created. Patient dependent in care and WC/Bedbound, chronic FC, CHF, COPD, history of dementia Admitted for hypoxia Wound type: Stage 3 Pressure Injury; posterior scrotum; fibrinous; full thickness; pale  Stage 2 Pressure Injury; sacrum; 100% pink  Unstageable with evidence of deep tissue pressure injury: right ischium; dark purple tissue with eschar consistent with evolution of deep tissue pressure injury Unstageable Pressure Injury; right buttock; ruddy, dark tissue with eschar  Pressure Injury POA: Yes Measurement:see nursing flow sheets Wound bed:see above  Drainage (amount, consistency, odor) serosanguinous  Periwound: intact  Dressing procedure/placement/frequency: 1. Cleanse sacral, right buttock, right ischium and scrotal wounds with saline, pat dry 2. Apply 1/4" thick layer of leptospermum honey to right buttock, right ischium and scrotal wound bed, top with saline moist gauze,top with dry dressing, change daily. 3. Ok to apply silicone foam only to the sacral wound.  Change medihoney dressings daily, ok to change sacral foam every 3 days and PRN soilage, assess under foam each shift for acute changes  Low air loss mattress in place while in the ICU for moisture management and pressure redistribution. Will need air mattress if the patient transfers out of the ICU.   Needs cushion evaluation for WC if patient is spending any time up in [facility] during the day at [facility]. Will follow up with PT on this.    Re consult if needed, will not follow at this time. Thanks  Gerard Bonus [employer], RN,CWOCN, CNS, CWON-AP [phone])

## 2023-04-06 NOTE — Progress Notes (Signed)
NAME:  [name], MRN:  [id number], DOB:  Aug 30, 1940, LOS: 1 ADMISSION DATE:  04/05/2023, CONSULTATION DATE:  04/05/23 REFERRING MD:  Dr. Rosalia Hammers, CHIEF COMPLAINT:  Hypoxia   History of Present Illness:  82 year old male presenting to [facility] ED from [facility] via EMS on 04/05/2023 for evaluation of hypoxia.  History provided per chart review and interview with SNF staff, patient too altered to participate in bedside interview. According to staff patient has " not been feeling well" since Monday, 04/04/2023.  Unfortunately there were no documented specific symptoms or complaints.  This staff member reported arriving at 3 PM and being called into the patient's room finding him using his accessory muscles to breathe with SpO2 of 74%.  She called EMS at that time.  This staff member shared that at baseline he is A&O x 4, able to make all his own decisions and he does not have any emergency contacts or legal guardian. However, previous chart documentation has noted a history of dementia. Patient is immobile at baseline due to weakness. According to paperwork sent with the patient he received all of his a.m. meds on 04/05/2023. EMS reported the patient's SpO2 has been in the 80s since 04/03/2023 and he was placed on a nonrebreather during transport.  The patient does not wear oxygen at baseline.  He has a chronic Foley catheter and an ostomy.   Of note patient was hospitalized in May 2024 for due to severe sepsis secondary to suspected UTI in the setting of chronic Foley catheter use. At this time he was noted to have swallowing issues as well. Spoke with the person listed as the responsible party for this patient: Achilles Dunk, who stated he was the owner of the previous facility the patient was in before [facility]. He confirmed the patient has no family and had recommended the SNF initiate the process with DSS for legal guardianship. It is unclear if this process was started, but the patient currently does not  have any legal guardian. 04/06/23- patient seen and examined at bedside.  S/p SLP today.  He has palliative care consult for GOC.  Currently treating for septic shock with pneumonia and soft tissue infection.   Pertinent  Medical History  Bipolar Disorder Schizophrenia C. 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  Interim History / Subjective:  Patient alert and responsive but altered with garbled speech, able to state first name.  Objective   Blood pressure (!) 103/55, pulse 79, temperature 98.2 F (36.8 C), temperature source Axillary, resp. rate (!) 23, height 5\' 10"  (1.778 m), weight 91.2 kg, SpO2 100%.    FiO2 (%):  [35 %-40 %] 40 %   Intake/Output Summary (Last 24 hours) at 04/06/2023 0940 Last data filed at 04/06/2023 0757 Gross per 24 hour  Intake 3341.54 ml  Output 605 ml  Net 2736.54 ml   Filed Weights   04/05/23 1854 04/06/23 0000 04/06/23 0400  Weight: 87.4 kg 91.3 kg 91.2 kg    Examination: General: Adult male, critically ill, lying in bed, NAD HEENT: MM pale/dry, anicteric, atraumatic, neck supple Neuro: A&O x 1, able to follow simple commands, PERRL +3, moves BUE against gravity/ able  to wiggle toes bilaterally > immobile at baseline CV: s1s2 RRR, NSR on monitor, no r/+m/g Pulm: Regular, non labored on 4 L [place], breath sounds coarse crackles throughout L > R GI: soft, rounded with ostomy in place, non tender, bs x 4 GU: foley in place with cloudy yellow urine Skin: sacral & scrotal pressure injuries  Extremities: warm/dry, pulses + 2 R/P, +2 edema noted BLE  Resolved Hospital Problem list      Assessment & Plan:       ASSESSMENT & PLAN:   -Septic Shock- present on admission - due to soft tissue infection            - sacral and scrotal cellulitis           - wound cultures, surgical eval, IV abx, IVF support           - telemetry monitoring , mAp goal >60 - use vasopressor as needed            -Left internal jugular palcement - CVP monitoring  - f/u cultures, trend lactic/ PCT - Daily CBC, monitor WBC/ fever curve - Continue vasopressors to maintain MAP< 65: norepinephrine   - Left lung pneumonia         - additional source of infection /sepsis         - continue zyvox and unasyn - pharmacy on case          -s/p levoquid, vancomycin  Consider BIPAP PRN overnight, wean FiO2 as tolerated - Supplemental O2 to maintain SpO2 > 90% - Intermittent chest x-ray & ABG PRN - Ensure adequate pulmonary hygiene   -Chronic pressure ulcers of sacrum/srotum       History of this on previous hospitalizations     - surgery/wound care  -Turn every 2, utilize offloading devices -WOC consultation, appreciate input   -CKD 3 - with acute on chronic renal failure            -dc nephrotoxin           -IFV  - consider urology consultation  -Severe hyperkalemia     - Lokelma, insulin/IVF/Glucose, nebulizer therapy   -Severe advanced psychiatric patient        - hx of Bipolar disorder        - hx of schizophrenia       - hx of seizure disorder       - legal guardianship for POA/GOC     -Advanced Age and severely comorbid status      - patietn would benefit from palliative care evaluation    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 -Consider restarting outpatient Eliquis as patient stabilizes  Acute encephalopathy in the setting of baseline vascular dementia Seizure disorder PMHx: Bipolar disorder and schizophrenia -TOC consulted to assist with DSS evaluation for legal  guardianship if process has not already been initiated by SNF -Outpatient phenobarbital continued as IV dosing -Outpatient Abilify, bupropion, Suvorexant on hold as patient is n.p.o.  Swallowing difficulties and possible aspiration -SLP consulted to evaluate, appreciate input -N.p.o. for now  Best Practice (right click and "Reselect all SmartList Selections" daily)  Diet/type: NPO DVT prophylaxis: systemic dose LMWH GI prophylaxis: PPI Lines: N/A Foley:  Yes, and it is still needed Code Status:  full code Last date of multidisciplinary goals of care discussion [UTA- no family/guardian, utilizing previous paperwork from SNF]  Labs   CBC: Recent  Labs  Lab 04/05/23 1904 04/06/23 0414  WBC 13.0* 13.2*  NEUTROABS 11.9*  --   HGB 8.8* 7.2*  HCT 29.9* 24.1*  MCV 90.3 88.9  PLT 470* 395    Basic Metabolic Panel: Recent Labs  Lab 04/05/23 1904 04/05/23 2114 04/06/23 0414  NA 141 135 139  K 7.4* 4.9 5.8*  CL 114* 108 111  CO2 20* 17* 20*  GLUCOSE 100* 120* 182*  BUN 56* 51* 59*  CREATININE 2.90* 2.46* 2.78*  CALCIUM 8.2* 6.8* 7.8*  MG  --   --  2.6*  PHOS  --   --  5.2*   GFR: Estimated Creatinine Clearance: 24.1 mL/min (A) (by C-G formula based on SCr of 2.78 mg/dL (H)). Recent Labs  Lab 04/05/23 1904 04/05/23 2241 04/06/23 0414  PROCALCITON 7.83  --  7.54  WBC 13.0*  --  13.2*  LATICACIDVEN 1.6 0.8  --     Liver Function Tests: Recent Labs  Lab 04/05/23 1904 04/06/23 0414  AST 45* 32  ALT 34 28  ALKPHOS 130* 108  BILITOT 0.8 0.8  PROT 7.5 6.5  ALBUMIN 2.3* 1.9*   No results for input(s): "LIPASE", "AMYLASE" in the last 168 hours. No results for input(s): "AMMONIA" in the last 168 hours.  ABG    Component Value Date/Time   HCO3 19.3 (L) 04/06/2023 0414   ACIDBASEDEF 5.6 (H) 04/06/2023 0414   O2SAT 81.1 04/06/2023 0414     Coagulation Profile: Recent Labs  Lab 04/05/23 1904  INR 1.6*    Cardiac Enzymes: No results for input(s):  "CKTOTAL", "CKMB", "CKMBINDEX", "TROPONINI" in the last 168 hours.  HbA1C: Hgb A1c MFr Bld  Date/Time Value Ref Range Status  05/03/2022 04:06 AM 5.1 4.8 - 5.6 % Final    Comment:    (NOTE) Pre diabetes:          5.7%-6.4%  Diabetes:              >6.4%  Glycemic control for   <7.0% adults with diabetes   01/12/2022 05:37 AM 5.8 (H) 4.8 - 5.6 % Final    Comment:    (NOTE) Pre diabetes:          5.7%-6.4%  Diabetes:              >6.4%  Glycemic control for   <7.0% adults with diabetes     CBG: Recent Labs  Lab 04/05/23 2332 04/06/23 0326 04/06/23 0723  GLUCAP 101* 139* 201*    Review of Systems:   UTA- patient unable to participate in history at this time  Past Medical History:  He,  has a past medical history of Bipolar 1 disorder (HCC), C. difficile diarrhea (05/2016), CHF (congestive heart failure) (HCC), Chronic kidney disease (05/21/2016), COPD (chronic obstructive pulmonary disease) (HCC), DVT (deep venous thrombosis) (HCC), GERD (gastroesophageal reflux disease), Hematuria, High cholesterol, History of kidney stones, Hypertension, Penile erosion (2018), Schizophrenia (HCC), Seizure (HCC), Urinary retention, and VRE (vancomycin resistant enterococcus) culture positive.   Surgical History:   Past Surgical History:  Procedure Laterality Date   COLECTOMY WITH COLOSTOMY CREATION/HARTMANN PROCEDURE N/A 05/12/2022   Procedure: PARTIAL COLECTOMY WITH COLOSTOMY CREATION/HARTMANN PROCEDURE;  Surgeon: Carolan Shiver, MD;  Location: [facility] ORS;  Service: General;  Laterality: N/A;   COLONOSCOPY WITH PROPOFOL N/A 05/09/2022   Procedure: COLONOSCOPY WITH PROPOFOL;  Surgeon: Jaynie Collins, DO;  Location: [facility] ENDOSCOPY;  Service: Gastroenterology;  Laterality: N/A;   EXTRACORPOREAL SHOCK WAVE LITHOTRIPSY Right 05/05/2017   Procedure: EXTRACORPOREAL  SHOCK WAVE LITHOTRIPSY (ESWL);  Surgeon: Vanna Scotland, MD;  Location: [facility] ORS;  Service: Urology;  Laterality:  Right;   IR CATHETER TUBE CHANGE  06/23/2017   IR CATHETER TUBE CHANGE  06/28/2017   IVC FILTER INSERTION N/A 05/11/2022   Procedure: IVC FILTER INSERTION;  Surgeon: Renford Dills, MD;  Location: [facility] INVASIVE CV LAB;  Service: Cardiovascular;  Laterality: N/A;   LAPAROTOMY N/A 05/12/2022   Procedure: EXPLORATORY LAPAROTOMY;  Surgeon: Carolan Shiver, MD;  Location: [facility] ORS;  Service: General;  Laterality: N/A;     Social History:   reports that he has never smoked. He has never been exposed to tobacco smoke. He has never used smokeless tobacco. He reports that he does not drink alcohol and does not use drugs.   Family History:  His family history includes Cancer in his mother. There is no history of Prostate cancer, Kidney disease, Kidney cancer, or Bladder Cancer.   Allergies Allergies  Allergen Reactions   Penicillin G Rash and Other (See Comments)    Has tolerated cephalosporins, zosyn, and augmentin     Home Medications  Prior to Admission medications   Medication Sig Start Date End Date Taking? Authorizing Provider  albuterol (VENTOLIN HFA) 108 (90 Base) MCG/ACT inhaler Inhale 2 puffs into the lungs every 6 (six) hours as needed for wheezing or shortness of breath.    [provider]  ammonium lactate (AMLACTIN) 12 % cream Apply 1 Application topically every 12 (twelve) hours.    [provider]  ARIPiprazole (ABILIFY) 20 MG tablet Take 20 mg by mouth in the morning.    [provider]  ascorbic acid (VITAMIN C) 500 MG tablet Take 1 tablet (500 mg total) by mouth 2 (two) times daily. 01/16/22   Lynn Ito, MD  buPROPion (BUDEPRION XL) 300 MG 24 hr tablet Take 1 tablet (300 mg total) by mouth daily. 04/26/16   Alford Highland, MD  carvedilol (COREG) 25 MG tablet Take 25 mg by mouth 2 (two) times daily. 04/27/22   [provider]  cetirizine (ZYRTEC) 10 MG tablet Take 10 mg by mouth in the morning.    [provider]   cholecalciferol (VITAMIN D3) 25 MCG (1000 UNIT) tablet Take 1,000 Units by mouth in the morning.    [provider]  Cranberry 250 MG CAPS Take 250 mg by mouth 2 (two) times daily.    [provider]  ELIQUIS 2.5 MG TABS tablet Take 2.5 mg by mouth 2 (two) times daily.    [provider]  ferrous sulfate 325 (65 FE) MG tablet Take 325 mg by mouth daily with breakfast.    [provider]  finasteride (PROSCAR) 5 MG tablet Take 1 tablet (5 mg total) by mouth daily. 04/27/16   Wieting, Richard, MD  fluticasone furoate-vilanterol (BREO ELLIPTA) 100-25 MCG/ACT AEPB Inhale 1 puff into the lungs daily. 05/18/22   Pennie Banter, DO  furosemide (LASIX) 20 MG tablet Take 1 tablet (20 mg total) by mouth 2 (two) times daily. 05/17/22   Pennie Banter, DO  guaiFENesin (ROBITUSSIN) 100 MG/5ML liquid Take 5 mLs by mouth every 4 (four) hours as needed for cough or to loosen phlegm. 05/17/22   Pennie Banter, DO  lactulose (CHRONULAC) 10 GM/15ML solution Take 15 mLs (10 g total) by mouth every 6 (six) hours as needed for mild constipation or moderate constipation. 05/17/22   Pennie Banter, DO  Multiple Vitamin (MULTI-VITAMINS) TABS Take 1 tablet by mouth  daily.    [provider]  nystatin (MYCOSTATIN/NYSTOP) powder Apply 1 Application topically 2 (two) times daily.    [provider]  PHENobarbital (LUMINAL) 64.8 MG tablet Take 64.8 mg by mouth daily.    [provider]  polyethylene glycol (MIRALAX / GLYCOLAX) 17 g packet Take 17 g by mouth daily as needed. 05/17/22   Esaw Grandchild A, DO  potassium chloride SA (KLOR-CON M) 20 MEQ tablet Take 1 tablet (20 mEq total) by mouth 2 (two) times daily. 05/17/22   Esaw Grandchild A, DO  pravastatin (PRAVACHOL) 10 MG tablet Take 10 mg by mouth at bedtime. 04/28/22   [provider]  Suvorexant 5 MG TABS Take 5 mg by mouth at bedtime.    [provider]  umeclidinium bromide (INCRUSE  ELLIPTA) 62.5 MCG/ACT AEPB Inhale 1 puff into the lungs daily. 05/18/22   Esaw Grandchild A, DO  zinc oxide 20 % ointment Apply 1 application. topically 2 (two) times daily. (Apply to legs)    [provider]    Critical care provider statement:   Total critical care time: 33 minutes   Performed by: Karna Christmas MD   Critical care time was exclusive of separately billable procedures and treating other patients.   Critical care was necessary to treat or prevent imminent or life-threatening deterioration.   Critical care was time spent personally by me on the following activities: development of treatment plan with patient and/or surrogate as well as nursing, discussions with consultants, evaluation of patient's response to treatment, examination of patient, obtaining history from patient or surrogate, ordering and performing treatments and interventions, ordering and review of laboratory studies, ordering and review of radiographic studies, pulse oximetry and re-evaluation of patient's condition.    Vida Rigger, M.D.  Pulmonary & Critical Care Medicine

## 2023-04-06 NOTE — Plan of Care (Addendum)
  Problem: Education: Goal: Knowledge of General Education information will improve Description: Including pain rating scale, medication(s)/side effects and non-pharmacologic comfort measures Outcome: Progressing   Problem: Health Behavior/Discharge Planning: Goal: Ability to manage health-related needs will improve Outcome: Progressing   Problem: Clinical Measurements: Goal: Ability to maintain clinical measurements within normal limits will improve Outcome: Progressing Goal: Will remain free from infection Outcome: Progressing Goal: Diagnostic test results will improve Outcome: Progressing Goal: Respiratory complications will improve Outcome: Progressing Goal: Cardiovascular complication will be avoided Outcome: Progressing   Problem: Activity: Goal: Risk for activity intolerance will decrease Outcome: Progressing   Problem: Nutrition: Goal: Adequate nutrition will be maintained Outcome: Progressing   Problem: Coping: Goal: Level of anxiety will decrease Outcome: Progressing   Problem: Elimination: Goal: Will not experience complications related to bowel motility Outcome: Progressing Goal: Will not experience complications related to urinary retention Outcome: Progressing   Problem: Pain Managment: Goal: General experience of comfort will improve Outcome: Progressing   Problem: Safety: Goal: Ability to remain free from injury will improve Outcome: Progressing   Problem: Skin Integrity: Goal: Risk for impaired skin integrity will decrease Outcome: Not Progressing    Pt arrived to ICU A&O x1 (self) on 2L [employer]. After CHG bath and cleansing/dressing of pressure sores, pt noted to desat to 87% and not recover with increasing of O2 to 6L [place]. Pt transitioned to Venturi mask, then to BiPAP for desaturation and increased work of breathing. Levo @ 10 upon arrival to ICU. Attempted to titrate down per orders/EMAR. Pt became hypotensive requiring CVC placement and addition of  Vaso. Levo titrated per orders/EMAR. Multiple pressure sores noted (see flowsheets/LDA avatar). Areas cleansed and dressed. WOC order placed by NP. Pt resting comfortably on BiPAP. Call bell within reach, bed alarm on, and bed in lowest position with floor mats down.

## 2023-04-06 NOTE — Progress Notes (Signed)
Pharmacy Antibiotic Note  [name] is a 82 y.o. male admitted on 04/05/2023 with  aspiration PNA .  Pharmacy has been consulted for Unasyn dosing.  Plan: Unasyn 3 gm IV Q12H ordered to start on 8/28 @ 0100.   Weight: 87.4 kg (192 lb 10.9 oz)  Temp (24hrs), Avg:99.7 F (37.6 C), Min:98.1 F (36.7 C), Max:102.4 F (39.1 C)  Recent Labs  Lab 04/05/23 1904 04/05/23 2114 04/05/23 2241  WBC 13.0*  --   --   CREATININE 2.90* 2.46*  --   LATICACIDVEN 1.6  --  0.8    Estimated Creatinine Clearance: 24.7 mL/min (A) (by C-G formula based on SCr of 2.46 mg/dL (H)).    Allergies  Allergen Reactions   Penicillin G Rash and Other (See Comments)    Has tolerated cephalosporins, zosyn, and augmentin    Antimicrobials this admission:   >>    >>   Dose adjustments this admission:   Microbiology results:  BCx:   UCx:    Sputum:    MRSA PCR:   Thank you for allowing pharmacy to be a part of this patient's care.  Zia Kanner D 04/06/2023 12:23 AM

## 2023-04-07 DIAGNOSIS — I10 Essential (primary) hypertension: Secondary | ICD-10-CM

## 2023-04-07 DIAGNOSIS — J9621 Acute and chronic respiratory failure with hypoxia: Secondary | ICD-10-CM

## 2023-04-07 DIAGNOSIS — I5032 Chronic diastolic (congestive) heart failure: Secondary | ICD-10-CM

## 2023-04-07 DIAGNOSIS — A419 Sepsis, unspecified organism: Secondary | ICD-10-CM

## 2023-04-07 DIAGNOSIS — J189 Pneumonia, unspecified organism: Secondary | ICD-10-CM | POA: Diagnosis not present

## 2023-04-07 DIAGNOSIS — R6521 Severe sepsis with septic shock: Secondary | ICD-10-CM

## 2023-04-07 DIAGNOSIS — N390 Urinary tract infection, site not specified: Secondary | ICD-10-CM | POA: Diagnosis not present

## 2023-04-07 DIAGNOSIS — G9341 Metabolic encephalopathy: Secondary | ICD-10-CM

## 2023-04-07 DIAGNOSIS — Z86711 Personal history of pulmonary embolism: Secondary | ICD-10-CM

## 2023-04-07 DIAGNOSIS — E875 Hyperkalemia: Secondary | ICD-10-CM

## 2023-04-07 LAB — HEMOGLOBIN AND HEMATOCRIT, BLOOD
HCT: 25.4 % — ABNORMAL LOW (ref 39.0–52.0)
Hemoglobin: 8.2 g/dL — ABNORMAL LOW (ref 13.0–17.0)

## 2023-04-07 LAB — CBC
HCT: 21.8 % — ABNORMAL LOW (ref 39.0–52.0)
Hemoglobin: 6.9 g/dL — ABNORMAL LOW (ref 13.0–17.0)
MCH: 27 pg (ref 26.0–34.0)
MCHC: 31.7 g/dL (ref 30.0–36.0)
MCV: 85.2 fL (ref 80.0–100.0)
Platelets: 391 10*3/uL (ref 150–400)
RBC: 2.56 MIL/uL — ABNORMAL LOW (ref 4.22–5.81)
RDW: 16.4 % — ABNORMAL HIGH (ref 11.5–15.5)
WBC: 9.7 10*3/uL (ref 4.0–10.5)
nRBC: 0 % (ref 0.0–0.2)

## 2023-04-07 LAB — GLUCOSE, CAPILLARY
Glucose-Capillary: 118 mg/dL — ABNORMAL HIGH (ref 70–99)
Glucose-Capillary: 108 mg/dL — ABNORMAL HIGH (ref 70–99)
Glucose-Capillary: 105 mg/dL — ABNORMAL HIGH (ref 70–99)
Glucose-Capillary: 114 mg/dL — ABNORMAL HIGH (ref 70–99)

## 2023-04-07 LAB — PREPARE RBC (CROSSMATCH)

## 2023-04-07 LAB — RENAL FUNCTION PANEL
Albumin: 1.9 g/dL — ABNORMAL LOW (ref 3.5–5.0)
Anion gap: 5 (ref 5–15)
BUN: 58 mg/dL — ABNORMAL HIGH (ref 8–23)
CO2: 22 mmol/L (ref 22–32)
Calcium: 7.7 mg/dL — ABNORMAL LOW (ref 8.9–10.3)
Chloride: 116 mmol/L — ABNORMAL HIGH (ref 98–111)
Creatinine, Ser: 2.68 mg/dL — ABNORMAL HIGH (ref 0.61–1.24)
GFR, Estimated: 23 mL/min — ABNORMAL LOW (ref >60)
Glucose, Bld: 118 mg/dL — ABNORMAL HIGH (ref 70–99)
Phosphorus: 4.6 mg/dL (ref 2.5–4.6)
Potassium: 4.4 mmol/L (ref 3.5–5.1)
Sodium: 143 mmol/L (ref 135–145)

## 2023-04-07 LAB — HEMOGLOBIN A1C
Hgb A1c MFr Bld: 5.9 % — ABNORMAL HIGH (ref 4.8–5.6)
Mean Plasma Glucose: 122.63 mg/dL

## 2023-04-07 LAB — LEGIONELLA PNEUMOPHILA SEROGP 1 UR AG: L. pneumophila Serogp 1 Ur Ag: NEGATIVE

## 2023-04-07 LAB — MAGNESIUM: Magnesium: 2.7 mg/dL — ABNORMAL HIGH (ref 1.7–2.4)

## 2023-04-07 LAB — PROCALCITONIN: Procalcitonin: 7.35 ng/mL

## 2023-04-07 MED ORDER — ALBUMIN HUMAN 25 % IV SOLN
25.0000 g | Freq: Once | INTRAVENOUS | Status: AC
  Administered 2023-04-07: 25 g via INTRAVENOUS
  Filled 2023-04-07: qty 100

## 2023-04-07 MED ORDER — INSULIN ASPART 100 UNIT/ML IJ SOLN
0.0000 [IU] | Freq: Three times a day (TID) | INTRAMUSCULAR | Status: DC
  Administered 2023-04-08 – 2023-04-12 (×5): 1 [IU] via SUBCUTANEOUS
  Filled 2023-04-07 (×4): qty 1

## 2023-04-07 MED ORDER — ALPRAZOLAM 0.5 MG PO TABS
0.5000 mg | ORAL_TABLET | Freq: Once | ORAL | Status: AC
  Administered 2023-04-07: 0.5 mg via ORAL
  Filled 2023-04-07: qty 1

## 2023-04-07 MED ORDER — ARIPIPRAZOLE 10 MG PO TABS
20.0000 mg | ORAL_TABLET | Freq: Every morning | ORAL | Status: DC
  Administered 2023-04-07 – 2023-04-13 (×6): 20 mg via ORAL
  Filled 2023-04-07 (×6): qty 2

## 2023-04-07 MED ORDER — SODIUM CHLORIDE 0.9% IV SOLUTION: Freq: Once | INTRAVENOUS | Status: AC

## 2023-04-07 MED ORDER — BUPROPION HCL ER (XL) 150 MG PO TB24
300.0000 mg | ORAL_TABLET | Freq: Every day | ORAL | Status: DC
  Administered 2023-04-07 – 2023-04-13 (×6): 300 mg via ORAL
  Filled 2023-04-07: qty 1
  Filled 2023-04-07: qty 2
  Filled 2023-04-07: qty 1
  Filled 2023-04-07 (×3): qty 2

## 2023-04-07 MED ORDER — PRAVASTATIN SODIUM 20 MG PO TABS
10.0000 mg | ORAL_TABLET | Freq: Every day | ORAL | Status: DC
  Administered 2023-04-07 – 2023-04-12 (×6): 10 mg via ORAL
  Filled 2023-04-07 (×6): qty 1

## 2023-04-07 MED ORDER — TRAZODONE HCL 50 MG PO TABS
50.0000 mg | ORAL_TABLET | Freq: Every day | ORAL | Status: DC
  Administered 2023-04-07 – 2023-04-12 (×6): 50 mg via ORAL
  Filled 2023-04-07 (×6): qty 1

## 2023-04-07 MED ORDER — SODIUM CHLORIDE 0.9% IV SOLUTION: Freq: Once | INTRAVENOUS | Status: DC

## 2023-04-07 MED ORDER — APIXABAN 2.5 MG PO TABS
2.5000 mg | ORAL_TABLET | Freq: Two times a day (BID) | ORAL | Status: DC
  Administered 2023-04-07 – 2023-04-13 (×11): 2.5 mg via ORAL
  Filled 2023-04-07 (×11): qty 1

## 2023-04-07 MED ORDER — PANTOPRAZOLE SODIUM 40 MG PO TBEC
40.0000 mg | DELAYED_RELEASE_TABLET | Freq: Every day | ORAL | Status: DC
  Administered 2023-04-08 – 2023-04-13 (×5): 40 mg via ORAL
  Filled 2023-04-07 (×5): qty 1

## 2023-04-07 MED ORDER — TRAZODONE HCL 50 MG PO TABS: 50.0000 mg | ORAL_TABLET | Freq: Every evening | ORAL | Status: DC | PRN

## 2023-04-07 MED ORDER — SUVOREXANT 5 MG PO TABS: 5.0000 mg | ORAL_TABLET | Freq: Every day | ORAL | Status: DC

## 2023-04-07 NOTE — Plan of Care (Signed)
  Problem: Education: Goal: Knowledge of General Education information will improve Description: Including pain rating scale, medication(s)/side effects and non-pharmacologic comfort measures Outcome: Progressing   Problem: Health Behavior/Discharge Planning: Goal: Ability to manage health-related needs will improve Outcome: Progressing   Problem: Clinical Measurements: Goal: Ability to maintain clinical measurements within normal limits will improve Outcome: Progressing Goal: Will remain free from infection Outcome: Progressing Goal: Diagnostic test results will improve Outcome: Progressing Goal: Respiratory complications will improve Outcome: Progressing Goal: Cardiovascular complication will be avoided Outcome: Progressing   Problem: Activity: Goal: Risk for activity intolerance will decrease Outcome: Progressing   Problem: Nutrition: Goal: Adequate nutrition will be maintained Outcome: Progressing   Problem: Coping: Goal: Level of anxiety will decrease Outcome: Progressing   Problem: Elimination: Goal: Will not experience complications related to bowel motility Outcome: Progressing Goal: Will not experience complications related to urinary retention Outcome: Progressing   Problem: Pain Managment: Goal: General experience of comfort will improve Outcome: Progressing   Problem: Safety: Goal: Ability to remain free from injury will improve Outcome: Progressing   Problem: Skin Integrity: Goal: Risk for impaired skin integrity will decrease Outcome: Progressing   Problem: Education: Goal: Ability to describe self-care measures that may prevent or decrease complications (Diabetes Survival Skills Education) will improve Outcome: Progressing   Problem: Coping: Goal: Ability to adjust to condition or change in health will improve Outcome: Progressing   Problem: Fluid Volume: Goal: Ability to maintain a balanced intake and output will improve Outcome:  Progressing   Problem: Health Behavior/Discharge Planning: Goal: Ability to identify and utilize available resources and services will improve Outcome: Progressing Goal: Ability to manage health-related needs will improve Outcome: Progressing   Problem: Metabolic: Goal: Ability to maintain appropriate glucose levels will improve Outcome: Progressing   Problem: Nutritional: Goal: Maintenance of adequate nutrition will improve Outcome: Progressing Goal: Progress toward achieving an optimal weight will improve Outcome: Progressing   Problem: Skin Integrity: Goal: Risk for impaired skin integrity will decrease Outcome: Progressing   Problem: Tissue Perfusion: Goal: Adequacy of tissue perfusion will improve Outcome: Progressing   

## 2023-04-07 NOTE — Consult Note (Signed)
PHARMACY CONSULT NOTE  Pharmacy Consult for Electrolyte Monitoring and Replacement   Recent Labs: Potassium (mmol/L)  Date Value  04/07/2023 4.4  10/03/2012 3.1 (L)   Magnesium (mg/dL)  Date Value  16/05/9603 2.7 (H)  10/02/2012 1.3 (L)   Calcium (mg/dL)  Date Value  54/04/8118 7.7 (L)   Calcium, Total (mg/dL)  Date Value  14/78/2956 8.2 (L)   Albumin (g/dL)  Date Value  21/30/8657 1.9 (L)  10/02/2012 3.4   Phosphorus (mg/dL)  Date Value  84/69/6295 4.6   Sodium (mmol/L)  Date Value  04/07/2023 143  10/03/2012 141   Assessment: 82 y/o M with medical history including bipolar disorder, schizophrenia, C diff (2017), CKD, COPD, DVT, GERD, HLD, seizure d/o, HTN, HFpEF, PE on apixaban, BPH with chronic foley, stage II pressure injury admitted with shock and acute respiratory failure.   Goal of Therapy:  Electrolytes within normal limits  Plan:  --No electrolyte replacement indicated at this time --Patient care transferred from PCCM to [facility]. Will discontinue electrolyte consult at this time. Defer further ordering of labs and electrolyte replacement to primary team  Tressie Ellis 04/07/2023 7:59 AM

## 2023-04-07 NOTE — Progress Notes (Addendum)
PROGRESS NOTE    [name]  ZOX:[id number] DOB: 1941-04-14 DOA: 04/05/2023 PCP: System, Provider Not In    Brief Narrative:  Patient is 82 years old male with past medical history of schizophrenia, bipolar disorder, CKD stage IIIa, COPD, DVT, GERD, hyperlipidemia, hypertension, heart failure with preserved ejection fraction, BPH with chronic Foley and stage II pressure injury presented from skilled nursing facility for evaluation of hypoxia.  Patient was reported to have not felt well and using accessory muscles of respiration.  He was noted to have pulse ox of 74% at the facility and EMS was called in.  Patient was then brought into with nonrebreather mask en route to the hospital.  Patient was then admitted hospital for circulatory shock suspect hypovolemic plus minus sepsis with hypoxic respiratory failure suspected secondary to aspiration and was on vasopressor support.  Subsequently patient was transferred out of the ICU.  Of note patient was recently hospitalized in May 2024 for severe sepsis secondary to UTI from chronic Foley catheter.  Palliative care also saw the patient during hospitalization.  Patient does not have family in currently legal guardianship underway.  Patient is immobile at baseline.    Assessment and Plan:  Septic shock present on admission likely secondary to soft tissue infection/UTI, Klebsiella bacteremia.  Patient had sacral and scrotal cellulitis.  Initially required vasopressor support.  Off vasopressor support at this time.  Blood pressure has improved at this time.  On IV Merrem and linezolid.  Will discontinue linezolid.  Received albumin as well.  Acute metabolic encephalopathy on the background of vascular dementia seizure disorder. Continue phenobarbital from outpatient.  Continue Abilify bupropion, trazodone.    Klebsiella bacteremia/Klebsiella UTI.  Urine culture showing positive for Klebsiella pneumonia.  On Merrem.  Will continue for now.   Leukocytosis has improved.  Will continue to monitor.  Anemia.  Hemoglobin of 6.9.  Received 1 unit of packed RBC.  Repeat hemoglobin of 8.2.  Left  lung pneumonia. Has completed course of antibiotic.  Currently on Merrem and linezolid.  Will discontinue linezolid.  Continue BiPAP as needed, pulmonary hygiene.  MRSA PCR positive.  Urinary strep antigen negative.  Chronic sacral and scrotal ulcer.  Continue wound care.  Mild AKI on CKD stage IIIa.  Received IV fluids.  Creatinine from today at 2.6 from initial 2.9.    Debility, frailty, deconditioning.  Palliative care on board.  Legal guardianship underway.  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   DVT prophylaxis: apixaban (ELIQUIS) tablet 2.5 mg Start: 04/07/23 1000 SCDs Start: 04/05/23 2240 apixaban (ELIQUIS) tablet 2.5 mg   Code Status:     Code Status: Full Code  Disposition: Uncertain at this time.  TOC on board.  Status is: Inpatient  Remains inpatient appropriate because: Multiple comorbidities, IV antibiotic, pending clinical improvement, needs disposition plan,   Family Communication: No contact person available  Consultants:  PCCM  Procedures:  PRBC transfusion  Antimicrobials:  Merrem, discontinue linezolid  Anti-infectives (From admission, onward)    Start     Dose/Rate Route Frequency Ordered Stop   04/06/23 2245  meropenem (MERREM) 1 g in sodium chloride 0.9 % 100 mL IVPB        1 g 200 mL/hr over 30 Minutes Intravenous Every 12 hours 04/06/23 2147     04/06/23 0200  linezolid (ZYVOX) IVPB 600 mg        600 mg 300 mL/hr over 60 Minutes Intravenous Every 12 hours 04/06/23 0140     04/06/23  0115  Ampicillin-Sulbactam (UNASYN) 3 g in sodium chloride 0.9 % 100 mL IVPB  Status:  Discontinued        3 g 200 mL/hr over 30 Minutes Intravenous Every 12 hours 04/06/23 0023 04/06/23 2145   04/05/23 1945  vancomycin (VANCOCIN) IVPB 1000 mg/200 mL premix  Status:  Discontinued        1,000  mg 200 mL/hr over 60 Minutes Intravenous  Once 04/05/23 1935 04/05/23 1940   04/05/23 1945  levofloxacin (LEVAQUIN) IVPB 750 mg        750 mg 100 mL/hr over 90 Minutes Intravenous  Once 04/05/23 1935 04/05/23 2150   04/05/23 1945  vancomycin (VANCOREADY) IVPB 2000 mg/400 mL        2,000 mg 200 mL/hr over 120 Minutes Intravenous  Once 04/05/23 1941 04/05/23 2230       Subjective: Today, patient was seen and examined at bedside.  Patient appears to be very frail weak and deconditioned.  Mildly Communicative.  Complains of cough.  Poor historian  Objective: Vitals:   04/07/23 0755 04/07/23 0800 04/07/23 0900 04/07/23 0908  BP:  108/60 (!) 111/56 (!) 107/54  Pulse:  71 77 77  Resp:  (!) 26 (!) 26 19  Temp:  97.9 F (36.6 C)  (!) 97.4 F (36.3 C)  TempSrc:  Oral  Axillary  SpO2: 100% 100% 98% 98%  Weight:      Height:        Intake/Output Summary (Last 24 hours) at 04/07/2023 1146 Last data filed at 04/07/2023 0906 Gross per 24 hour  Intake 1966.41 ml  Output 725 ml  Net 1241.41 ml   Filed Weights   04/06/23 0000 04/06/23 0400 04/07/23 0500  Weight: 91.3 kg 91.2 kg 90.6 kg    Physical Examination: Body mass index is 28.66 kg/m.  General:  Average built, not in obvious distress, weak and deconditioned, slow speech, intermittently coughing, HENT:   Pallor noted, oral mucosa is moist.  Chest: .  Diminished breath sounds bilaterally.  Coarse breath sounds noted bilaterally CVS: S1 &S2 heard. No murmur.  Regular rate and rhythm. Abdomen: Soft, nontender, nondistended.  Bowel sounds are heard.  Ostomy bag in place, suprapubic Foley catheter in place. Extremities: No cyanosis, clubbing or edema.  Peripheral pulses are palpable. Psych: Alert, awake and oriented x 1, follows commands, CNS:  No cranial nerve deficits.  Generalized weakness noted, lower extremity weakness Skin: Warm and dry.  Sacral and scrotal pressure injuries on presentation.  Data Reviewed:   CBC: Recent  Labs  Lab 04/05/23 1904 04/06/23 0414 04/06/23 1151 04/06/23 1825 04/07/23 0353 04/07/23 1035  WBC 13.0* 13.2*  --   --  9.7  --   NEUTROABS 11.9*  --   --   --   --   --   HGB 8.8* 7.2* 6.5* 7.3* 6.9* 8.2*  HCT 29.9* 24.1* 20.9* 23.4* 21.8* 25.4*  MCV 90.3 88.9  --   --  85.2  --   PLT 470* 395  --   --  391  --     Basic Metabolic Panel: Recent Labs  Lab 04/05/23 1904 04/05/23 2114 04/06/23 0414 04/06/23 1825 04/07/23 0353  NA 141 135 139  --  143  K 7.4* 4.9 5.8* 4.4 4.4  CL 114* 108 111  --  116*  CO2 20* 17* 20*  --  22  GLUCOSE 100* 120* 182*  --  118*  BUN 56* 51* 59*  --  58*  CREATININE 2.90*  2.46* 2.78*  --  2.68*  CALCIUM 8.2* 6.8* 7.8*  --  7.7*  MG  --   --  2.6*  --  2.7*  PHOS  --   --  5.2*  --  4.6    Liver Function Tests: Recent Labs  Lab 04/05/23 1904 04/06/23 0414 04/07/23 0353  AST 45* 32  --   ALT 34 28  --   ALKPHOS 130* 108  --   BILITOT 0.8 0.8  --   PROT 7.5 6.5  --   ALBUMIN 2.3* 1.9* 1.9*     Radiology Studies: DG Chest 1 View  Result Date: 04/06/2023 CLINICAL DATA:  Check central line placement EXAM: PORTABLE CHEST 1 VIEW COMPARISON:  Film from the previous day. FINDINGS: Left jugular central line is noted with catheter tip over the proximal superior vena cava. Cardiac shadow is stable. Lungs again demonstrate patchy infiltrate in the left lung. No other focal abnormality is noted. No pneumothorax is seen. IMPRESSION: No pneumothorax following central line placement. Electronically Signed   By: Alcide Clever M.D.   On: 04/06/2023 01:54   CT ABDOMEN PELVIS WO CONTRAST  Result Date: 04/05/2023 CLINICAL DATA:  Sepsis, hypoxia, febrile EXAM: CT ABDOMEN AND PELVIS WITHOUT CONTRAST TECHNIQUE: Multidetector CT imaging of the abdomen and pelvis was performed following the standard protocol without IV contrast. RADIATION DOSE REDUCTION: This exam was performed according to the departmental dose-optimization program which includes automated  exposure control, adjustment of the mA and/or kV according to patient size and/or use of iterative reconstruction technique. COMPARISON:  02/17/2023, 12/19/2022 FINDINGS: Lower chest: There is bilateral airspace disease, left greater than right. Findings are consistent with multifocal pneumonia or aspiration. Calcification of the aortic and mitral valves. Atherosclerosis of the aorta and coronary vasculature. Hepatobiliary: Small calcified gallstones without evidence of cholecystitis. Unremarkable unenhanced appearance of the liver. Pancreas: Unremarkable unenhanced appearance. Spleen: Unremarkable unenhanced appearance. Adrenals/Urinary Tract: Bilateral staghorn calculi are again identified, without change in size or appearance. Calculi are seen within the renal collecting systems and ureteropelvic junctions bilaterally, stable. There is persistent left-sided hydronephrosis and hydroureter, unchanged since prior exam. The bladder is decompressed with a suprapubic catheter in place. Chronic bladder wall thickening. A 7 mm bladder calculus is identified on this exam, new since prior study the adrenals are unremarkable. Stomach/Bowel: No bowel obstruction or ileus. There is progressive rectal wall thickening, consistent with proctitis. 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 Vascular/Lymphatic: Stable IVC filter. Aortic atherosclerosis unchanged. Subcentimeter lymph nodes within the retroperitoneum and bilateral external iliac chains, likely reactive. No pathologic adenopathy. Reproductive: The prostate is not enlarged. Other: No free fluid or free intraperitoneal gas. No abdominal wall hernia. Musculoskeletal: Diffuse body wall edema again seen. Bones are osteopenic. No acute or destructive bony abnormalities. Reconstructed images demonstrate no additional findings. IMPRESSION: 1. Bilateral airspace disease, left greater than right, consistent with multifocal pneumonia or aspiration. 2. Stable left  hydronephrosis and hydroureter, unchanged since prior exam. 3. 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 4. Chronic nonspecific bladder wall thickening with suprapubic catheter in place. New 7 mm nonobstructing bladder calculus. 5. Cholelithiasis without cholecystitis. 6. Progressive rectal wall thickening, consistent with proctitis. 7.  Aortic Atherosclerosis (ICD10-I70.0). Electronically Signed   By: Sharlet Salina M.D.   On: 04/05/2023 23:39   CT HEAD WO CONTRAST ( )  Result Date: 04/05/2023 CLINICAL DATA:  Altered mental status and hypoxia EXAM: CT HEAD WITHOUT CONTRAST TECHNIQUE: Contiguous axial images were obtained from  the base of the skull through the vertex without intravenous contrast. RADIATION DOSE REDUCTION: This exam was performed according to the departmental dose-optimization program which includes automated exposure control, adjustment of the mA and/or kV according to patient size and/or use of iterative reconstruction technique. COMPARISON:  02/17/2023 FINDINGS: Brain: No evidence of acute infarction, hemorrhage, hydrocephalus, extra-axial collection or mass lesion/mass effect. Chronic atrophic and ischemic changes are noted. 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 Vascular: No hyperdense vessel or unexpected calcification. Skull: Normal. Negative for fracture or focal lesion. Sinuses/Orbits: No acute finding. Other: None. IMPRESSION: Chronic atrophic and ischemic changes as described. No acute abnormality noted. Electronically Signed   By: Alcide Clever M.D.   On: 04/05/2023 23:31   DG Chest 1 View  Result Date: 04/05/2023 CLINICAL DATA:  Hypoxia, cough, and fever EXAM: CHEST  1 VIEW COMPARISON:  Chest radiograph dated 12/19/2022 FINDINGS: Normal lung volumes. Asymmetric left lung patchy and confluent opacities. No pleural effusion or pneumothorax. The heart size and mediastinal contours are within normal limits. No  acute osseous abnormality. IMPRESSION: Asymmetric left lung patchy and confluent opacities, suspicious for pneumonia. Electronically Signed   By: Agustin Cree M.D.   On: 04/05/2023 19:52      LOS: 2 days    Joycelyn Das, MD [facility] Available via Epic secure chat 7am-7pm After these hours, please refer to coverage provider listed on [email or web address] 04/07/2023, 11:46 AM

## 2023-04-07 NOTE — TOC CM/SW Note (Signed)
Cm called [facility] and spoke with rep in Adult Protective Services Department. Cm gave rep information for Adult Protective Services report. Cm explained that pt does not have any relatives or legal guardians at this time. Pt is AAOx2. Rep stated she will file information and inform supervisor.

## 2023-04-07 NOTE — Progress Notes (Signed)
PHARMACIST - PHYSICIAN COMMUNICATION  CONCERNING: IV to Oral Route Change Policy  RECOMMENDATION: This patient is receiving pantoprazole by the intravenous route.  Based on criteria approved by the Pharmacy and Therapeutics Committee, the intravenous medication(s) is/are being converted to the equivalent oral dose form(s).  DESCRIPTION: These criteria include: The patient is eating (either orally or via tube) and/or has been taking other orally administered medications for a least 24 hours The patient has no evidence of active gastrointestinal bleeding or impaired GI absorption (gastrectomy, short bowel, patient on TNA or NPO).  If you have questions about this conversion, please contact the Pharmacy Department   Tressie Ellis, [facility] 04/07/2023 11:20 AM

## 2023-04-07 NOTE — Plan of Care (Signed)
  Problem: Education: Goal: Knowledge of General Education information will improve Description: Including pain rating scale, medication(s)/side effects and non-pharmacologic comfort measures Outcome: Not Progressing   Problem: Health Behavior/Discharge Planning: Goal: Ability to manage health-related needs will improve Outcome: Not Progressing   Problem: Clinical Measurements: Goal: Ability to maintain clinical measurements within normal limits will improve Outcome: Progressing Goal: Will remain free from infection Outcome: Progressing Goal: Diagnostic test results will improve Outcome: Progressing Goal: Respiratory complications will improve Outcome: Not Progressing Goal: Cardiovascular complication will be avoided Outcome: Progressing   Problem: Activity: Goal: Risk for activity intolerance will decrease Outcome: Not Progressing   Problem: Nutrition: Goal: Adequate nutrition will be maintained Outcome: Progressing   Problem: Elimination: Goal: Will not experience complications related to bowel motility Outcome: Progressing Goal: Will not experience complications related to urinary retention Outcome: Progressing

## 2023-04-08 DIAGNOSIS — A419 Sepsis, unspecified organism: Secondary | ICD-10-CM | POA: Diagnosis not present

## 2023-04-08 DIAGNOSIS — J449 Chronic obstructive pulmonary disease, unspecified: Secondary | ICD-10-CM

## 2023-04-08 DIAGNOSIS — N390 Urinary tract infection, site not specified: Secondary | ICD-10-CM | POA: Diagnosis not present

## 2023-04-08 DIAGNOSIS — J9621 Acute and chronic respiratory failure with hypoxia: Secondary | ICD-10-CM | POA: Diagnosis not present

## 2023-04-08 DIAGNOSIS — T83031A Leakage of indwelling urethral catheter, initial encounter: Secondary | ICD-10-CM | POA: Diagnosis not present

## 2023-04-08 DIAGNOSIS — J189 Pneumonia, unspecified organism: Secondary | ICD-10-CM | POA: Diagnosis not present

## 2023-04-08 LAB — GLUCOSE, CAPILLARY
Glucose-Capillary: 76 mg/dL (ref 70–99)
Glucose-Capillary: 131 mg/dL — ABNORMAL HIGH (ref 70–99)
Glucose-Capillary: 130 mg/dL — ABNORMAL HIGH (ref 70–99)
Glucose-Capillary: 110 mg/dL — ABNORMAL HIGH (ref 70–99)

## 2023-04-08 LAB — RENAL FUNCTION PANEL
Albumin: 1.9 g/dL — ABNORMAL LOW (ref 3.5–5.0)
Anion gap: 7 (ref 5–15)
BUN: 58 mg/dL — ABNORMAL HIGH (ref 8–23)
CO2: 21 mmol/L — ABNORMAL LOW (ref 22–32)
Calcium: 7.6 mg/dL — ABNORMAL LOW (ref 8.9–10.3)
Chloride: 116 mmol/L — ABNORMAL HIGH (ref 98–111)
Creatinine, Ser: 2.13 mg/dL — ABNORMAL HIGH (ref 0.61–1.24)
GFR, Estimated: 31 mL/min — ABNORMAL LOW (ref >60)
Glucose, Bld: 97 mg/dL (ref 70–99)
Phosphorus: 3.4 mg/dL (ref 2.5–4.6)
Potassium: 4 mmol/L (ref 3.5–5.1)
Sodium: 144 mmol/L (ref 135–145)

## 2023-04-08 LAB — CBC
HCT: 27 % — ABNORMAL LOW (ref 39.0–52.0)
Hemoglobin: 8.7 g/dL — ABNORMAL LOW (ref 13.0–17.0)
MCH: 27.7 pg (ref 26.0–34.0)
MCHC: 32.2 g/dL (ref 30.0–36.0)
MCV: 86 fL (ref 80.0–100.0)
Platelets: 349 10*3/uL (ref 150–400)
RBC: 3.14 MIL/uL — ABNORMAL LOW (ref 4.22–5.81)
RDW: 16.4 % — ABNORMAL HIGH (ref 11.5–15.5)
WBC: 7.9 10*3/uL (ref 4.0–10.5)
nRBC: 0 % (ref 0.0–0.2)

## 2023-04-08 LAB — URINE CULTURE: Culture: >=100000 — AB

## 2023-04-08 LAB — MAGNESIUM: Magnesium: 2.5 mg/dL — ABNORMAL HIGH (ref 1.7–2.4)

## 2023-04-08 MED ORDER — FINASTERIDE 5 MG PO TABS
5.0000 mg | ORAL_TABLET | Freq: Every day | ORAL | Status: DC
  Administered 2023-04-09 – 2023-04-13 (×4): 5 mg via ORAL
  Filled 2023-04-08 (×4): qty 1

## 2023-04-08 MED ORDER — PHENOBARBITAL 32.4 MG PO TABS
64.8000 mg | ORAL_TABLET | Freq: Every day | ORAL | Status: DC
  Administered 2023-04-08 – 2023-04-13 (×5): 64.8 mg via ORAL
  Filled 2023-04-08 (×5): qty 2

## 2023-04-08 MED ORDER — GABAPENTIN 100 MG PO CAPS
100.0000 mg | ORAL_CAPSULE | Freq: Three times a day (TID) | ORAL | Status: DC
  Administered 2023-04-08 – 2023-04-13 (×13): 100 mg via ORAL
  Filled 2023-04-08 (×13): qty 1

## 2023-04-08 NOTE — Progress Notes (Signed)
Speech Language Pathology Treatment: Dysphagia  Patient Details Name: [name] MRN: [id number] DOB: 1941/01/22 Today's Date: 04/08/2023 Time: 0900-0945 SLP Time Calculation (min) (ACUTE ONLY): 45 min  Assessment / Plan / Recommendation Clinical Impression  Pt seen for ongoing toleration of dysphagia diet today. Pt awake, verbal but min slow to respond at times. Answers a few basic questions but suspect impact of Cognitive decline. Pt appears weak and requires MAX support w/ positioning and feeding.  On RA; afebrile now. WBC WNL.    Pt appears to present w/ oropharyngeal phase dysphagia w/ Neuromuscular deficits and known delay in pharyngeal swallow initiation resulting in SILENT aspiration of thin and Nectar consistency liquids -- see the H&P part of note above; and MBSS report 01/2022. ALSO noted impact from Cognitive decline resulting in decreased awareness/insight of his medical status and risk for aspiration/aspiration pneumonia. Pt has dx of Dementia per chart. Noted all motor movements and verbal responses were slightly slow --  suspect in setting of his medical and pschiatric dxs, medications(this was noted in 01/2022). Pt required MOD+ tactile/verbal/visual cues for follow through w/ tasks.     Pt consumed TSP trials of Honey consistency liquids and Puree foods w/ no immediate, overt clinical s/s of aspiration noted; no immediate cough and no decline in respiratory presentation noted during/post trials. O2 sats remained 95%. Clear vocal quality noted b/t most trials. 1-2 mildly congested coughs b/t trials were noted. He exhibited premorbid coughing PRIOR TO po's given(but unsure if caused by pharyngeal residue?). Multiple swallows were noted intermittently as well as slightly decreased hyolaryngeal excursion to palpation. Suspect pharyngeal swallow delay based on prior objective swallow study results(01/2022). Oral phase was c/b min slow, deliberate bolus management and oral clearing of all  boluses given. A-P transfer was functional w/ Honey liquids and purees. Pt required FULL feeding support.  This was a similar presentation as at [facility], but pt's ANS is improved today.   In setting of Baseline Dysphagia w/ dysphagia diet recommended s/p MBSS, his increased risk for aspiration/aspiration pneumonia, medical decline, hospitalization, and sedentary status, recommend continuing w/ current dysphagia level 1 diet(puree) w/ Honey consistency liquids via TSP; strict aspiration precautions; Pills Crushed in puree for safety; tray setup at meals; Supervision/monitoring at meals; reduce Distractions during meals; feeding support. NSG/MD updated w/ plan for trial diet.  ST services will f/u w/ toleration of diet next 2-3 days to assess his tolerance; suspect he his close to/at his swallowing Baseline. Recommend Palliative Care consult for GOC and direction moving forward. NSG/MD updated, agreed.     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 difficile colitis, kidney stone, BPH, CKD-3A, obesity, positive VRE urine culture in past, presenting to the emergency department for evaluation of hypoxia.  Per EMS report, patient had an oxygen saturation in the 80s since yesterday at his Facility.  He arrives on a nonrebreather.  Does not wear oxygen at baseline.  Has a chronic Foley catheter as well as an ostomy.  Patient able to tell me his name, unable to provide other history, does have documented history of Dementia.      SLP Plan  Continue with current plan of care      Recommendations for follow up therapy are one component of a  multi-disciplinary discharge planning process, led by the attending physician.   Recommendations may be updated based on patient status, additional functional criteria and insurance authorization.    Recommendations  Diet recommendations: Dysphagia 1 (puree);Honey-thick liquid Liquids provided via: Teaspoon Medication Administration: Crushed with puree Supervision: Staff to assist with self feeding;Full supervision/cueing for compensatory strategies Compensations: Minimize environmental distractions;Slow rate;Small sips/bites;Lingual sweep for clearance of pocketing;Multiple dry swallows after each bite/sip;Follow solids with liquid Postural Changes and/or Swallow Maneuvers: Out of bed for meals;Seated upright 90 degrees;Upright 30-60 min after meal                 (Palliative Care consult; Dietician f/u) Oral care BID;Oral care before and after PO;Staff/trained caregiver to provide oral care   Frequent or constant Supervision/Assistance Dysphagia, oropharyngeal phase (R13.12) (baseline Dysphagia, Dementia)     Continue with current plan of care        Jerilynn Som, MS, CCC-SLP Speech Language Pathologist Rehab Services; [facility] [place] [phone] (ascom) Gean Laursen  04/08/2023, 3:18 PM

## 2023-04-08 NOTE — Plan of Care (Signed)

## 2023-04-08 NOTE — Progress Notes (Addendum)
PROGRESS NOTE    [name]  XBJ:[id number] DOB: 20-Jan-1941 DOA: 04/05/2023 PCP: System, Provider Not In    Brief Narrative:  Patient is 82 years old male with past medical history of schizophrenia, bipolar disorder, CKD stage IIIa, COPD, DVT, GERD, hyperlipidemia, hypertension, heart failure with preserved ejection fraction, BPH with chronic Foley and stage II pressure injury presented from skilled nursing facility for evaluation of hypoxia.  Patient was reported to have not felt well and using accessory muscles of respiration.  He was noted to have pulse ox of 74% at the facility and EMS was called in.  Patient was then brought into with nonrebreather mask en route to the hospital.  Patient was then admitted hospital for circulatory shock suspect hypovolemic plus minus sepsis with hypoxic respiratory failure suspected secondary to aspiration and was on vasopressor support.  Subsequently, patient was transferred out of the ICU.  Of note, patient was recently hospitalized in May 2024 for severe sepsis secondary to UTI from chronic Foley catheter.  Palliative care also saw the patient during hospitalization.  Patient does not have family in currently legal guardianship underway.  Patient is immobile at baseline.  Blood cultures with ESBL Klebsiella on IV Merrem.  At this time patient has remained stable and will transfer the patient out of the ICU.   Assessment and Plan:  Septic shock present on admission likely secondary to soft tissue infection/UTI, Klebsiella bacteremia.  Patient had sacral and scrotal cellulitis.  Initially required vasopressor and albumin support.  Off vasopressor support at this time.  Blood pressure has improved..  Patient was initially on  IV Merrem and linezolid.  Currently off linezolid.   Acute metabolic encephalopathy on the background of vascular dementia, seizure disorder. Continue phenobarbital from outpatient.  Continue Abilify, bupropion, trazodone.    ESBL  Klebsiella bacteremia/Klebsiella UTI.  Urine culture showing positive for Klebsiella pneumonia.  On IV Merrem.  Leukocytosis has improved.  Will continue to monitor.  Anemia.  Hemoglobin of 6.9.  Received 1 unit of packed RBC.  Repeat hemoglobin of 8.2.  Left  lung pneumonia. Has completed course of antibiotic.  Currently on Merrem. off linezolid.  Continue BiPAP as needed, pulmonary hygiene.  MRSA PCR positive.  Urinary strep antigen negative.  Chronic sacral and scrotal ulcer.  Continue wound care.  Mild AKI on CKD stage IIIa.  Received IV fluids.  Creatinine from today at 2.1 from 2.6 <2.9.    Debility, frailty, deconditioning.  Palliative care on board.  Legal guardianship underway.  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  Suprapubic Foley catheter leakage.  Patient had pain when trying to exchange his suprapubic catheter so I have texted urology on-call Dr Virl Diamond to address this.   DVT prophylaxis: apixaban (ELIQUIS) tablet 2.5 mg Start: 04/07/23 1000 SCDs Start: 04/05/23 2240 apixaban (ELIQUIS) tablet 2.5 mg   Code Status:     Code Status: Full Code  Disposition: Uncertain at this time.  TOC on board.  Will transfer out of stepdown unit to telemetry.  Status is: Inpatient  Remains inpatient appropriate because: Multiple comorbidities, IV antibiotic, pending clinical improvement, needs guardianship and disposition plan.   Family Communication: No contact person available  Consultants:  PCCM  Procedures:  PRBC transfusion  Antimicrobials:  Merrem IV  Anti-infectives (From admission, onward)    Start     Dose/Rate Route Frequency Ordered Stop   04/06/23 2245  meropenem (MERREM) 1 g in sodium chloride 0.9 % 100 mL IVPB  1 g 200 mL/hr over 30 Minutes Intravenous Every 12 hours 04/06/23 2147     04/06/23 0200  linezolid (ZYVOX) IVPB 600 mg  Status:  Discontinued        600 mg 300 mL/hr over 60 Minutes Intravenous Every 12 hours 04/06/23  0140 04/07/23 1158   04/06/23 0115  Ampicillin-Sulbactam (UNASYN) 3 g in sodium chloride 0.9 % 100 mL IVPB  Status:  Discontinued        3 g 200 mL/hr over 30 Minutes Intravenous Every 12 hours 04/06/23 0023 04/06/23 2145   04/05/23 1945  vancomycin (VANCOCIN) IVPB 1000 mg/200 mL premix  Status:  Discontinued        1,000 mg 200 mL/hr over 60 Minutes Intravenous  Once 04/05/23 1935 04/05/23 1940   04/05/23 1945  levofloxacin (LEVAQUIN) IVPB 750 mg        750 mg 100 mL/hr over 90 Minutes Intravenous  Once 04/05/23 1935 04/05/23 2150   04/05/23 1945  vancomycin (VANCOREADY) IVPB 2000 mg/400 mL        2,000 mg 200 mL/hr over 120 Minutes Intravenous  Once 04/05/23 1941 04/05/23 2230      Subjective: Today, patient was seen and examined bedside.  Patient states okay.  Appears weak and deconditioned and frail.  Mild Communicative.  Has some cough.  Following commands.    Objective: Vitals:   04/08/23 0900 04/08/23 1000 04/08/23 1100 04/08/23 1200  BP: 131/73 (!) 119/55 120/75 123/64  Pulse: 92 94 93 86  Resp: (!) 30 (!) 31 (!) 23 17  Temp:    98.6 F (37 C)  TempSrc:    Oral  SpO2: 96% 99% 100% 98%  Weight:      Height:        Intake/Output Summary (Last 24 hours) at 04/08/2023 1321 Last data filed at 04/08/2023 1300 Gross per 24 hour  Intake 787.68 ml  Output 1250 ml  Net -462.32 ml   Filed Weights   04/06/23 0400 04/07/23 0500 04/08/23 0500  Weight: 91.2 kg 90.6 kg 93.5 kg    Physical Examination: Body mass index is 29.58 kg/m.   General:  Average built, not in obvious distress, weak and deconditioned, frail appearing slow speech, with cough. HENT:   Pallor noted, oral mucosa is moist.  Chest: .  Diminished breath sounds bilaterally.  Coarse breath sounds noted bilaterally, CVS: S1 &S2 heard. No murmur.  Regular rate and rhythm. Abdomen: Soft, nontender, nondistended.  Bowel sounds are heard.  Ostomy bag in place, suprapubic  foley catheter in place. Extremities: No  cyanosis, clubbing or edema.  Peripheral pulses are palpable. Psych: Alert, awake and follows commands, oriented to self. CNS:  No cranial nerve deficits.  Generalized weakness noted, bilateral lower extremity weakness Skin: Warm and dry.  Sacral and scrotal pressure injuries on presentation.  Data Reviewed:   CBC: Recent Labs  Lab 04/05/23 1904 04/06/23 0414 04/06/23 1151 04/06/23 1825 04/07/23 0353 04/07/23 1035 04/08/23 0716  WBC 13.0* 13.2*  --   --  9.7  --  7.9  NEUTROABS 11.9*  --   --   --   --   --   --   HGB 8.8* 7.2* 6.5* 7.3* 6.9* 8.2* 8.7*  HCT 29.9* 24.1* 20.9* 23.4* 21.8* 25.4* 27.0*  MCV 90.3 88.9  --   --  85.2  --  86.0  PLT 470* 395  --   --  391  --  349    Basic Metabolic Panel: Recent Labs  Lab 04/05/23 1904 04/05/23 2114 04/06/23 0414 04/06/23 1825 04/07/23 0353 04/08/23 0716  NA 141 135 139  --  143 144  K 7.4* 4.9 5.8* 4.4 4.4 4.0  CL 114* 108 111  --  116* 116*  CO2 20* 17* 20*  --  22 21*  GLUCOSE 100* 120* 182*  --  118* 97  BUN 56* 51* 59*  --  58* 58*  CREATININE 2.90* 2.46* 2.78*  --  2.68* 2.13*  CALCIUM 8.2* 6.8* 7.8*  --  7.7* 7.6*  MG  --   --  2.6*  --  2.7* 2.5*  PHOS  --   --  5.2*  --  4.6 3.4    Liver Function Tests: Recent Labs  Lab 04/05/23 1904 04/06/23 0414 04/07/23 0353 04/08/23 0716  AST 45* 32  --   --   ALT 34 28  --   --   ALKPHOS 130* 108  --   --   BILITOT 0.8 0.8  --   --   PROT 7.5 6.5  --   --   ALBUMIN 2.3* 1.9* 1.9* 1.9*     Radiology Studies: No results found.    LOS: 3 days    Joycelyn Das, MD [facility] Available via Epic secure chat 7am-7pm After these hours, please refer to coverage provider listed on [email or web address] 04/08/2023, 1:21 PM

## 2023-04-08 NOTE — Consult Note (Signed)
82 y.o. male with a chronic indwelling suprapubic tube.  Noted to have some leakage around the catheter and SP tube changed ordered however when attempts at catheter removal patient complained of significant pain or any catheter tension.  The balloon was reinflated.  Exam: Catheter draining clear urine  The balloon was deflated.  Some resistance met due to mild encrustation of the deflated balloon.  Mild bleeding was noted.  An 68 [place] nonlatex catheter was placed and the balloon inflated with 10 cc of sterile water.   Irineo Axon, MD

## 2023-04-08 NOTE — Plan of Care (Signed)
  Problem: Education: Goal: Knowledge of General Education information will improve Description: Including pain rating scale, medication(s)/side effects and non-pharmacologic comfort measures Outcome: Progressing   Problem: Health Behavior/Discharge Planning: Goal: Ability to manage health-related needs will improve Outcome: Progressing   Problem: Clinical Measurements: Goal: Ability to maintain clinical measurements within normal limits will improve Outcome: Progressing Goal: Will remain free from infection Outcome: Progressing Goal: Diagnostic test results will improve Outcome: Progressing Goal: Respiratory complications will improve Outcome: Progressing Goal: Cardiovascular complication will be avoided Outcome: Progressing   Problem: Activity: Goal: Risk for activity intolerance will decrease Outcome: Progressing   Problem: Nutrition: Goal: Adequate nutrition will be maintained Outcome: Progressing   Problem: Coping: Goal: Level of anxiety will decrease Outcome: Progressing   Problem: Elimination: Goal: Will not experience complications related to bowel motility Outcome: Progressing Goal: Will not experience complications related to urinary retention Outcome: Progressing   Problem: Pain Managment: Goal: General experience of comfort will improve Outcome: Progressing   Problem: Safety: Goal: Ability to remain free from injury will improve Outcome: Progressing   Problem: Skin Integrity: Goal: Risk for impaired skin integrity will decrease Outcome: Progressing   Problem: Education: Goal: Ability to describe self-care measures that may prevent or decrease complications (Diabetes Survival Skills Education) will improve Outcome: Progressing   Problem: Coping: Goal: Ability to adjust to condition or change in health will improve Outcome: Progressing   Problem: Fluid Volume: Goal: Ability to maintain a balanced intake and output will improve Outcome:  Progressing   Problem: Health Behavior/Discharge Planning: Goal: Ability to identify and utilize available resources and services will improve Outcome: Progressing Goal: Ability to manage health-related needs will improve Outcome: Progressing   Problem: Metabolic: Goal: Ability to maintain appropriate glucose levels will improve Outcome: Progressing   Problem: Nutritional: Goal: Maintenance of adequate nutrition will improve Outcome: Progressing Goal: Progress toward achieving an optimal weight will improve Outcome: Progressing   Problem: Skin Integrity: Goal: Risk for impaired skin integrity will decrease Outcome: Progressing   Problem: Tissue Perfusion: Goal: Adequacy of tissue perfusion will improve Outcome: Progressing   

## 2023-04-08 NOTE — Progress Notes (Signed)
Pt's suprapubic catheter noted to be leaking. MD ordered it to be exchanged. This RN and charge RN, Fleet Contras, went in to exchange the catheter. The balloon was deflated, the pt complained of pain as soon as Fleet Contras put any tension on the catheter. The balloon was re-inflated and MD Pokhrel made aware.

## 2023-04-09 DIAGNOSIS — J9621 Acute and chronic respiratory failure with hypoxia: Secondary | ICD-10-CM | POA: Diagnosis not present

## 2023-04-09 DIAGNOSIS — J189 Pneumonia, unspecified organism: Secondary | ICD-10-CM | POA: Diagnosis not present

## 2023-04-09 DIAGNOSIS — A419 Sepsis, unspecified organism: Secondary | ICD-10-CM | POA: Diagnosis not present

## 2023-04-09 DIAGNOSIS — N390 Urinary tract infection, site not specified: Secondary | ICD-10-CM | POA: Diagnosis not present

## 2023-04-09 LAB — BPAM RBC
Blood Product Expiration Date: 202408312359
Blood Product Expiration Date: 202409252359
Blood Product Expiration Date: 202409252359
ISSUE DATE / TIME: 202408281519
ISSUE DATE / TIME: 202408290604
Unit Type and Rh: 5100
Unit Type and Rh: 5100
Unit Type and Rh: 9500

## 2023-04-09 LAB — TYPE AND SCREEN
ABO/RH(D): O POS
Antibody Screen: NEGATIVE
Unit division: 0
Unit division: 0
Unit division: 0

## 2023-04-09 LAB — CBC
HCT: 26.8 % — ABNORMAL LOW (ref 39.0–52.0)
Hemoglobin: 8.6 g/dL — ABNORMAL LOW (ref 13.0–17.0)
MCH: 27.5 pg (ref 26.0–34.0)
MCHC: 32.1 g/dL (ref 30.0–36.0)
MCV: 85.6 fL (ref 80.0–100.0)
Platelets: 329 10*3/uL (ref 150–400)
RBC: 3.13 MIL/uL — ABNORMAL LOW (ref 4.22–5.81)
RDW: 16.5 % — ABNORMAL HIGH (ref 11.5–15.5)
WBC: 5.6 10*3/uL (ref 4.0–10.5)
nRBC: 0 % (ref 0.0–0.2)

## 2023-04-09 LAB — CULTURE, BLOOD (ROUTINE X 2)

## 2023-04-09 LAB — BASIC METABOLIC PANEL
Anion gap: 6 (ref 5–15)
BUN: 50 mg/dL — ABNORMAL HIGH (ref 8–23)
CO2: 22 mmol/L (ref 22–32)
Calcium: 7.7 mg/dL — ABNORMAL LOW (ref 8.9–10.3)
Chloride: 117 mmol/L — ABNORMAL HIGH (ref 98–111)
Creatinine, Ser: 1.61 mg/dL — ABNORMAL HIGH (ref 0.61–1.24)
GFR, Estimated: 43 mL/min — ABNORMAL LOW (ref >60)
Glucose, Bld: 96 mg/dL (ref 70–99)
Potassium: 3.9 mmol/L (ref 3.5–5.1)
Sodium: 145 mmol/L (ref 135–145)

## 2023-04-09 LAB — GLUCOSE, CAPILLARY
Glucose-Capillary: 93 mg/dL (ref 70–99)
Glucose-Capillary: 112 mg/dL — ABNORMAL HIGH (ref 70–99)
Glucose-Capillary: 88 mg/dL (ref 70–99)
Glucose-Capillary: 89 mg/dL (ref 70–99)

## 2023-04-09 LAB — PREPARE RBC (CROSSMATCH)

## 2023-04-09 MED ORDER — SODIUM CHLORIDE 0.9 % IV SOLN: INTRAVENOUS | Status: DC | PRN

## 2023-04-09 MED ORDER — IPRATROPIUM-ALBUTEROL 0.5-2.5 (3) MG/3ML IN SOLN: 3.0000 mL | RESPIRATORY_TRACT | Status: DC | PRN

## 2023-04-09 NOTE — Progress Notes (Signed)
PROGRESS NOTE    [name]  ZOX:[id number] DOB: [birth year] DOA: 04/05/2023 PCP: System, Provider Not In    Brief Narrative:  Patient is 82 years old male with past medical history of schizophrenia, bipolar disorder, CKD stage IIIa, COPD, DVT, GERD, hyperlipidemia, hypertension, heart failure with preserved ejection fraction, BPH with chronic Foley and stage II pressure injury presented from skilled nursing facility for evaluation of hypoxia.  Patient was reported to have not felt well and using accessory muscles of respiration.  He was noted to have pulse ox of 74% at the facility and EMS was called in.  Patient was then brought into with nonrebreather mask en route to the hospital.  Patient was then admitted hospital for circulatory shock suspect hypovolemic plus minus sepsis with hypoxic respiratory failure suspected secondary to aspiration and was on vasopressor support.  Subsequently, patient was transferred out of the ICU.  Of note, patient was recently hospitalized in May 2024 for severe sepsis secondary to UTI from chronic Foley catheter.  Palliative care also saw the patient during hospitalization.  Patient does not have family in currently legal guardianship underway.  Patient is immobile at baseline.  Blood cultures with ESBL Klebsiella on IV Merrem.  At this time patient has remained stable and will transfer the patient out of the ICU.   Assessment and Plan:  Septic shock present on admission likely secondary to soft tissue infection/UTI, Klebsiella bacteremia.  Patient had sacral and scrotal cellulitis.  Initially required vasopressor and albumin support.  Off vasopressor support at this time.  Blood pressure has improved..  Patient was initially on  IV Merrem and linezolid.  Currently off linezolid.   Acute metabolic encephalopathy on the background of vascular dementia, seizure disorder. Continue phenobarbital from outpatient.  Continue Abilify, bupropion, trazodone.    ESBL  Klebsiella bacteremia/Klebsiella UTI.  Urine culture showing positive for Klebsiella pneumonia.  On IV Merrem.  Leukocytosis has improved.  continue to monitor.  Anemia.  Hemoglobin of 6.9.  Received 1 unit of packed RBC.  Repeat hemoglobin of 8.6.  Left  lung pneumonia. Has completed course of antibiotic.  Currently on Merrem. off linezolid.  Continue BiPAP as needed, pulmonary hygiene.  MRSA PCR positive.  Urinary strep antigen negative.  Chronic sacral and scrotal ulcer.  Continue wound care.  Mild AKI on CKD stage IIIa.  Proved with IV fluids.   Lab Results  Component Value Date   CREATININE 1.61 (H) 04/09/2023   CREATININE 2.13 (H) 04/08/2023   CREATININE 2.68 (H) 04/07/2023     Debility, frailty, deconditioning.  Palliative care on board.  Legal guardianship underway.  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  Suprapubic Foley catheter leakage.  Patient had pain when trying to exchange his suprapubic catheter -the catheter was exchanged by Dr. Lonna Cobb on 8/30   DVT prophylaxis: apixaban (ELIQUIS) tablet 2.5 mg Start: 04/07/23 1000 SCDs Start: 04/05/23 2240 apixaban (ELIQUIS) tablet 2.5 mg   Code Status:     Code Status: Full Code  Disposition: Uncertain at this time.  TOC on board.  He is a long-term resident of [facility] should be able to go back.  Status is: Inpatient  Remains inpatient appropriate because: Multiple comorbidities, IV antibiotic, pending clinical improvement, needs guardianship and disposition plan.   Family Communication: No contact person available  Consultants:  PCCM  Procedures:  PRBC transfusion  Antimicrobials:  Merrem IV  Anti-infectives (From admission, onward)    Start  Dose/Rate Route Frequency Ordered Stop   04/06/23 2245  meropenem (MERREM) 1 g in sodium chloride 0.9 % 100 mL IVPB        1 g 200 mL/hr over 30 Minutes Intravenous Every 12 hours 04/06/23 2147     04/06/23  0200  linezolid (ZYVOX) IVPB 600 mg  Status:  Discontinued        600 mg 300 mL/hr over 60 Minutes Intravenous Every 12 hours 04/06/23 0140 04/07/23 1158   04/06/23 0115  Ampicillin-Sulbactam (UNASYN) 3 g in sodium chloride 0.9 % 100 mL IVPB  Status:  Discontinued        3 g 200 mL/hr over 30 Minutes Intravenous Every 12 hours 04/06/23 0023 04/06/23 2145   04/05/23 1945  vancomycin (VANCOCIN) IVPB 1000 mg/200 mL premix  Status:  Discontinued        1,000 mg 200 mL/hr over 60 Minutes Intravenous  Once 04/05/23 1935 04/05/23 1940   04/05/23 1945  levofloxacin (LEVAQUIN) IVPB 750 mg        750 mg 100 mL/hr over 90 Minutes Intravenous  Once 04/05/23 1935 04/05/23 2150   04/05/23 1945  vancomycin (VANCOREADY) IVPB 2000 mg/400 mL        2,000 mg 200 mL/hr over 120 Minutes Intravenous  Once 04/05/23 1941 04/05/23 2230      Subjective: No new issues  Appears weak and deconditioned and frail.  Mild Communicative.  Has some cough.  Nurse tech is feeding him meal  Objective: Vitals:   04/09/23 0440 04/09/23 0500 04/09/23 0711 04/09/23 0855  BP: 121/73   130/72  Pulse: 81   88  Resp: 18   18  Temp: 98.2 F (36.8 C)   98.3 F (36.8 C)  TempSrc: Oral     SpO2: 95%  94% 95%  Weight:  95.5 kg    Height:        Intake/Output Summary (Last 24 hours) at 04/09/2023 1842 Last data filed at 04/09/2023 1730 Gross per 24 hour  Intake 1115.66 ml  Output 1300 ml  Net -184.34 ml   Filed Weights   04/07/23 0500 04/08/23 0500 04/09/23 0500  Weight: 90.6 kg 93.5 kg 95.5 kg    Physical Examination: Body mass index is 30.21 kg/m.   General:  Average built, not in obvious distress, weak and deconditioned, frail appearing slow speech, with cough. HENT:   Pallor noted, oral mucosa is moist.  Chest: .  Diminished breath sounds bilaterally.  Coarse breath sounds noted bilaterally, CVS: S1 &S2 heard. No murmur.  Regular rate and rhythm. Abdomen: Soft, nontender, nondistended.  Bowel sounds are heard.   Ostomy bag in place, suprapubic  foley catheter in place. Extremities: No cyanosis, clubbing or edema.  Peripheral pulses are palpable. Psych: Alert, awake and follows commands, oriented to self. CNS:  No cranial nerve deficits.  Generalized weakness noted, bilateral lower extremity weakness Skin: Warm and dry.  Sacral and scrotal pressure injuries on presentation.  Data Reviewed:   CBC: Recent Labs  Lab 04/05/23 1904 04/06/23 0414 04/06/23 1151 04/06/23 1825 04/07/23 0353 04/07/23 1035 04/08/23 0716 04/09/23 0506  WBC 13.0* 13.2*  --   --  9.7  --  7.9 5.6  NEUTROABS 11.9*  --   --   --   --   --   --   --   HGB 8.8* 7.2*   < > 7.3* 6.9* 8.2* 8.7* 8.6*  HCT 29.9* 24.1*   < > 23.4* 21.8* 25.4* 27.0* 26.8*  MCV  90.3 88.9  --   --  85.2  --  86.0 85.6  PLT 470* 395  --   --  391  --  349 329   < > = values in this interval not displayed.    Basic Metabolic Panel: Recent Labs  Lab 04/05/23 2114 04/06/23 0414 04/06/23 1825 04/07/23 0353 04/08/23 0716 04/09/23 0506  NA 135 139  --  143 144 145  K 4.9 5.8* 4.4 4.4 4.0 3.9  CL 108 111  --  116* 116* 117*  CO2 17* 20*  --  22 21* 22  GLUCOSE 120* 182*  --  118* 97 96  BUN 51* 59*  --  58* 58* 50*  CREATININE 2.46* 2.78*  --  2.68* 2.13* 1.61*  CALCIUM 6.8* 7.8*  --  7.7* 7.6* 7.7*  MG  --  2.6*  --  2.7* 2.5*  --   PHOS  --  5.2*  --  4.6 3.4  --     Liver Function Tests: Recent Labs  Lab 04/05/23 1904 04/06/23 0414 04/07/23 0353 04/08/23 0716  AST 45* 32  --   --   ALT 34 28  --   --   ALKPHOS 130* 108  --   --   BILITOT 0.8 0.8  --   --   PROT 7.5 6.5  --   --   ALBUMIN 2.3* 1.9* 1.9* 1.9*     Radiology Studies: No results found.    LOS: 4 days  Hypertension  Time spent 25 minutes  Delfino Lovett, MD [facility] Available via Epic secure chat 7am-7pm After these hours, please refer to coverage provider listed on [email or web address] 04/09/2023, 6:42 PM

## 2023-04-09 NOTE — Progress Notes (Signed)
Speech Language Pathology Treatment: Dysphagia  Patient Details Name: [name] MRN: [id number] DOB: Dec 26, 1940 Today's Date: 04/09/2023 Time: 0840-0910 SLP Time Calculation (min) (ACUTE ONLY): 30 min  Assessment / Plan / Recommendation Clinical Impression  Pt seen for ongoing toleration of dysphagia diet today - he transferred out of CCU. Pt awake, verbal but min slow to respond at times. Answers a few basic questions; suspect impact of Cognitive decline/Dementia dx'd Baseline per MD. Pt appears weak and requires MAX support w/ positioning and feeding.  On RA; afebrile. WBC WNL.    Pt appears to present w/ oropharyngeal phase dysphagia w/ Neuromuscular deficits and known delay in pharyngeal swallow initiation resulting in SILENT aspiration of thin and Nectar consistency liquids -- see the H&P part of note above; and MBSS report 01/2022. ALSO noted impact from Cognitive decline resulting in decreased awareness/insight of his medical status and risk for aspiration/aspiration pneumonia. Pt has dx of Dementia per chart. Noted all motor movements and verbal responses were slightly slow --  suspect in setting of his medical and pschiatric dxs, medications(this was noted in 01/2022). Pt required MOD+ tactile/verbal/visual cues for follow through w/ tasks.     Pt consumed TSP trials of Honey consistency liquids then few Puree trials w/ no immediate, overt clinical s/s of aspiration noted; no immediate cough and no decline in respiratory presentation noted during/post trials. Clear vocal quality noted b/t most trials. 1-2 mildly congested coughs b/t trials were noted during session -- this has been typical of previous sessions. Multiple swallows were noted intermittently. Suspect impact of pharyngeal phase deficits per prior objective swallow study results(01/2022). Oral phase was c/b slightly slow, deliberate bolus management and oral clearing of all boluses given. A-P transfer was functional w/ Honey  liquids and purees. Pt required FULL feeding support.  This was a similar presentation as at [facility], but pt's ANS is improved today.   In setting of Baseline Dysphagia w/ dysphagia diet recommended s/p MBSS, his increased risk for aspiration/aspiration pneumonia, medical decline, hospitalization, and sedentary status, recommend continuing w/ current dysphagia level 1 diet(puree) w/ Honey consistency liquids via TSP; strict aspiration precautions; Pills Crushed in puree for safety; tray setup at meals; Supervision/monitoring at meals; reduce Distractions during meals; feeding support. NSG/MD updated w/ plan for trial diet.  Pt appears at/close to his Swallowing Baseline. ST services will sign off at this time w/ MD to reconsult if any new swallowing needs arise during admit. Recommend Palliative Care consult for GOC and direction moving forward. NSG/MD updated, agreed.     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 difficile colitis, kidney stone, BPH, CKD-3A, obesity, positive VRE urine culture in past, presenting to the emergency department for evaluation of hypoxia.  Per EMS report, patient had an oxygen saturation in the 80s since yesterday at his Facility.  He arrives on a nonrebreather.  Does not wear oxygen at baseline.  Has a chronic Foley catheter as well as an ostomy.  Patient able to tell me his name, unable to provide other history, does have documented history of Dementia.      SLP Plan  All goals met      Recommendations for follow up therapy are one component of a multi-disciplinary discharge planning process, led by  the attending physician.  Recommendations may be updated based on patient status,  additional functional criteria and insurance authorization.    Recommendations  Diet recommendations: Dysphagia 1 (puree);Honey-thick liquid Liquids provided via: Teaspoon Medication Administration: Crushed with puree Supervision: Staff to assist with self feeding;Full supervision/cueing for compensatory strategies Compensations: Minimize environmental distractions;Slow rate;Small sips/bites;Lingual sweep for clearance of pocketing;Multiple dry swallows after each bite/sip;Follow solids with liquid Postural Changes and/or Swallow Maneuvers: Out of bed for meals;Seated upright 90 degrees;Upright 30-60 min after meal                 (Palliative Care; Dietician) Oral care BID;Oral care before and after PO;Staff/trained caregiver to provide oral care   Frequent or constant Supervision/Assistance Dysphagia, oropharyngeal phase (R13.12) (baseline Dysphagia, Dementia)     All goals met       [name] Som, MS, CCC-SLP Speech Language Pathologist Rehab Services; [facility] [place] [phone] (ascom) [name]  04/09/2023, 12:17 PM

## 2023-04-09 NOTE — Plan of Care (Signed)
  Problem: Education: Goal: Knowledge of General Education information will improve Description: Including pain rating scale, medication(s)/side effects and non-pharmacologic comfort measures Outcome: Progressing   Problem: Health Behavior/Discharge Planning: Goal: Ability to manage health-related needs will improve Outcome: Progressing   Problem: Clinical Measurements: Goal: Ability to maintain clinical measurements within normal limits will improve Outcome: Progressing Goal: Will remain free from infection Outcome: Progressing Goal: Diagnostic test results will improve Outcome: Progressing Goal: Respiratory complications will improve Outcome: Progressing Goal: Cardiovascular complication will be avoided Outcome: Progressing   Problem: Activity: Goal: Risk for activity intolerance will decrease Outcome: Progressing   Problem: Nutrition: Goal: Adequate nutrition will be maintained Outcome: Progressing   Problem: Coping: Goal: Level of anxiety will decrease Outcome: Progressing   Problem: Elimination: Goal: Will not experience complications related to bowel motility Outcome: Progressing Goal: Will not experience complications related to urinary retention Outcome: Progressing   Problem: Pain Managment: Goal: General experience of comfort will improve Outcome: Progressing   Problem: Safety: Goal: Ability to remain free from injury will improve Outcome: Progressing   Problem: Skin Integrity: Goal: Risk for impaired skin integrity will decrease Outcome: Progressing   Problem: Education: Goal: Ability to describe self-care measures that may prevent or decrease complications (Diabetes Survival Skills Education) will improve Outcome: Progressing   Problem: Coping: Goal: Ability to adjust to condition or change in health will improve Outcome: Progressing   Problem: Fluid Volume: Goal: Ability to maintain a balanced intake and output will improve Outcome:  Progressing   Problem: Health Behavior/Discharge Planning: Goal: Ability to identify and utilize available resources and services will improve Outcome: Progressing Goal: Ability to manage health-related needs will improve Outcome: Progressing   Problem: Metabolic: Goal: Ability to maintain appropriate glucose levels will improve Outcome: Progressing   Problem: Nutritional: Goal: Maintenance of adequate nutrition will improve Outcome: Progressing Goal: Progress toward achieving an optimal weight will improve Outcome: Progressing   Problem: Skin Integrity: Goal: Risk for impaired skin integrity will decrease Outcome: Progressing   Problem: Tissue Perfusion: Goal: Adequacy of tissue perfusion will improve Outcome: Progressing   

## 2023-04-10 DIAGNOSIS — A419 Sepsis, unspecified organism: Secondary | ICD-10-CM | POA: Diagnosis not present

## 2023-04-10 DIAGNOSIS — R6521 Severe sepsis with septic shock: Secondary | ICD-10-CM | POA: Diagnosis not present

## 2023-04-10 LAB — BASIC METABOLIC PANEL
Anion gap: 5 (ref 5–15)
BUN: 40 mg/dL — ABNORMAL HIGH (ref 8–23)
CO2: 22 mmol/L (ref 22–32)
Calcium: 7.7 mg/dL — ABNORMAL LOW (ref 8.9–10.3)
Chloride: 117 mmol/L — ABNORMAL HIGH (ref 98–111)
Creatinine, Ser: 1.33 mg/dL — ABNORMAL HIGH (ref 0.61–1.24)
GFR, Estimated: 54 mL/min — ABNORMAL LOW (ref >60)
Glucose, Bld: 85 mg/dL (ref 70–99)
Potassium: 3.8 mmol/L (ref 3.5–5.1)
Sodium: 144 mmol/L (ref 135–145)

## 2023-04-10 LAB — CBC
HCT: 27.5 % — ABNORMAL LOW (ref 39.0–52.0)
Hemoglobin: 8.6 g/dL — ABNORMAL LOW (ref 13.0–17.0)
MCH: 27 pg (ref 26.0–34.0)
MCHC: 31.3 g/dL (ref 30.0–36.0)
MCV: 86.5 fL (ref 80.0–100.0)
Platelets: 322 10*3/uL (ref 150–400)
RBC: 3.18 MIL/uL — ABNORMAL LOW (ref 4.22–5.81)
RDW: 16.5 % — ABNORMAL HIGH (ref 11.5–15.5)
WBC: 4.4 10*3/uL (ref 4.0–10.5)
nRBC: 0 % (ref 0.0–0.2)

## 2023-04-10 LAB — GLUCOSE, CAPILLARY
Glucose-Capillary: 91 mg/dL (ref 70–99)
Glucose-Capillary: 114 mg/dL — ABNORMAL HIGH (ref 70–99)
Glucose-Capillary: 122 mg/dL — ABNORMAL HIGH (ref 70–99)
Glucose-Capillary: 127 mg/dL — ABNORMAL HIGH (ref 70–99)

## 2023-04-10 MED ORDER — SODIUM CHLORIDE 0.9 % IV SOLN
1.0000 g | Freq: Three times a day (TID) | INTRAVENOUS | Status: DC
  Administered 2023-04-10 – 2023-04-13 (×7): 1 g via INTRAVENOUS
  Filled 2023-04-10 (×9): qty 20

## 2023-04-10 NOTE — Progress Notes (Signed)
Patient is coughing after bites of food and sips of fluid. MD notified. Order received to change patient to NPO until seen by speech

## 2023-04-10 NOTE — Plan of Care (Signed)
  Problem: Education: Goal: Knowledge of General Education information will improve Description: Including pain rating scale, medication(s)/side effects and non-pharmacologic comfort measures Outcome: Progressing   Problem: Health Behavior/Discharge Planning: Goal: Ability to manage health-related needs will improve Outcome: Progressing   Problem: Clinical Measurements: Goal: Ability to maintain clinical measurements within normal limits will improve Outcome: Progressing Goal: Will remain free from infection Outcome: Progressing Goal: Diagnostic test results will improve Outcome: Progressing Goal: Respiratory complications will improve Outcome: Progressing Goal: Cardiovascular complication will be avoided Outcome: Progressing   Problem: Activity: Goal: Risk for activity intolerance will decrease Outcome: Progressing   Problem: Nutrition: Goal: Adequate nutrition will be maintained Outcome: Progressing   Problem: Coping: Goal: Level of anxiety will decrease Outcome: Progressing   Problem: Elimination: Goal: Will not experience complications related to bowel motility Outcome: Progressing Goal: Will not experience complications related to urinary retention Outcome: Progressing   Problem: Pain Managment: Goal: General experience of comfort will improve Outcome: Progressing   Problem: Safety: Goal: Ability to remain free from injury will improve Outcome: Progressing   Problem: Skin Integrity: Goal: Risk for impaired skin integrity will decrease Outcome: Progressing   Problem: Education: Goal: Ability to describe self-care measures that may prevent or decrease complications (Diabetes Survival Skills Education) will improve Outcome: Progressing   Problem: Coping: Goal: Ability to adjust to condition or change in health will improve Outcome: Progressing   Problem: Fluid Volume: Goal: Ability to maintain a balanced intake and output will improve Outcome:  Progressing   Problem: Health Behavior/Discharge Planning: Goal: Ability to identify and utilize available resources and services will improve Outcome: Progressing Goal: Ability to manage health-related needs will improve Outcome: Progressing   Problem: Metabolic: Goal: Ability to maintain appropriate glucose levels will improve Outcome: Progressing   Problem: Nutritional: Goal: Maintenance of adequate nutrition will improve Outcome: Progressing Goal: Progress toward achieving an optimal weight will improve Outcome: Progressing   Problem: Skin Integrity: Goal: Risk for impaired skin integrity will decrease Outcome: Progressing   Problem: Tissue Perfusion: Goal: Adequacy of tissue perfusion will improve Outcome: Progressing   

## 2023-04-10 NOTE — Progress Notes (Signed)
PROGRESS NOTE    [name]  UEA:[id number] DOB: Jun 03, 1941 DOA: 04/05/2023 PCP: System, Provider Not In    Brief Narrative:  Patient is 82 years old male with past medical history of schizophrenia, bipolar disorder, CKD stage IIIa, COPD, DVT, GERD, hyperlipidemia, hypertension, heart failure with preserved ejection fraction, BPH with chronic Foley and stage II pressure injury presented from skilled nursing facility for evaluation of hypoxia.  Patient was reported to have not felt well and using accessory muscles of respiration.  He was noted to have pulse ox of 74% at the facility and EMS was called in.  Patient was then brought into with nonrebreather mask en route to the hospital.  Patient was then admitted hospital for circulatory shock suspect hypovolemic plus minus sepsis with hypoxic respiratory failure suspected secondary to aspiration and was on vasopressor support.  Subsequently, patient was transferred out of the ICU.  Of note, patient was recently hospitalized in May 2024 for severe sepsis secondary to UTI from chronic Foley catheter.  Palliative care also saw the patient during hospitalization.  Patient does not have family in currently legal guardianship underway.  Blood cultures with ESBL Klebsiella on IV Merrem.   04/10/2023: The patient was seen and examined at his bedside.  Alert and follows command.  He has no new complaints at this time.  Dependent edema noted on exam.  PT OT consulted   Assessment and Plan:  Resolved septic shock present on admission likely secondary to soft tissue infection/UTI, Klebsiella bacteremia.  Patient had sacral and scrotal cellulitis.  Initially required vasopressor and albumin support.  Off vasopressor support at this time.  Blood pressure has improved..  Patient was initially on  IV Merrem and linezolid.  Currently off linezolid.  Sepsis physiology has improved.  Acute metabolic encephalopathy on the background of vascular dementia, seizure  disorder. Continue phenobarbital from outpatient.  Continue Abilify, bupropion, trazodone.  Appears to be at his baseline.  Mood is appropriate, follows commands.   ESBL Klebsiella bacteremia/Klebsiella UTI, POA.  Urine culture showing Klebsiella pneumonia ESBL.  On IV Merrem.  Leukocytosis has improved.  continue to monitor.  Chronic normocytic anemia.  Hemoglobin of 6.9.  Received 1 unit of packed RBC.  Repeat hemoglobin of 8.6. Hemoglobin stable, no reported overt bleeding at this time.  Left  lung pneumonia. Has completed course of antibiotic.  Currently on Merrem. off linezolid.  Continue BiPAP as needed, pulmonary hygiene.  MRSA PCR positive.  Urinary strep antigen negative.  Chronic sacral and scrotal ulcer.  Continue wound care.  Mild AKI on CKD stage IIIa.  Improved with IV fluids, currently off IV fluids.   Lab Results  Component Value Date   CREATININE 1.33 (H) 04/10/2023   CREATININE 1.61 (H) 04/09/2023   CREATININE 2.13 (H) 04/08/2023     Debility, frailty, deconditioning.  Palliative care on board.  Legal guardianship underway.  Overall very poor prognosis.  Seen by PT OT requiring 2 people assist.  Needs direct supervision and assist.  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  Suprapubic Foley catheter leakage.  Patient had pain when trying to exchange his suprapubic catheter -the catheter was exchanged by Dr. Lonna Cobb on 8/30   DVT prophylaxis: apixaban (ELIQUIS) tablet 2.5 mg Start: 04/07/23 1000 SCDs Start: 04/05/23 2240 apixaban (ELIQUIS) tablet 2.5 mg   Code Status:     Code Status: Full Code  Disposition: Uncertain at this time.  TOC on board.  He is a long-term resident of [facility]  should be able to go back.  Status is: Inpatient  Remains inpatient appropriate because: Multiple comorbidities, IV antibiotic, pending clinical improvement, needs guardianship and disposition plan.   Family Communication: No contact person  available  Consultants:  PCCM  Procedures:  PRBC transfusion  Antimicrobials:  Merrem IV  Anti-infectives (From admission, onward)    Start     Dose/Rate Route Frequency Ordered Stop   04/10/23 2200  meropenem (MERREM) 1 g in sodium chloride 0.9 % 100 mL IVPB        1 g 200 mL/hr over 30 Minutes Intravenous Every 8 hours 04/10/23 1439     04/06/23 2245  meropenem (MERREM) 1 g in sodium chloride 0.9 % 100 mL IVPB  Status:  Discontinued        1 g 200 mL/hr over 30 Minutes Intravenous Every 12 hours 04/06/23 2147 04/10/23 1439   04/06/23 0200  linezolid (ZYVOX) IVPB 600 mg  Status:  Discontinued        600 mg 300 mL/hr over 60 Minutes Intravenous Every 12 hours 04/06/23 0140 04/07/23 1158   04/06/23 0115  Ampicillin-Sulbactam (UNASYN) 3 g in sodium chloride 0.9 % 100 mL IVPB  Status:  Discontinued        3 g 200 mL/hr over 30 Minutes Intravenous Every 12 hours 04/06/23 0023 04/06/23 2145   04/05/23 1945  vancomycin (VANCOCIN) IVPB 1000 mg/200 mL premix  Status:  Discontinued        1,000 mg 200 mL/hr over 60 Minutes Intravenous  Once 04/05/23 1935 04/05/23 1940   04/05/23 1945  levofloxacin (LEVAQUIN) IVPB 750 mg        750 mg 100 mL/hr over 90 Minutes Intravenous  Once 04/05/23 1935 04/05/23 2150   04/05/23 1945  vancomycin (VANCOREADY) IVPB 2000 mg/400 mL        2,000 mg 200 mL/hr over 120 Minutes Intravenous  Once 04/05/23 1941 04/05/23 2230        Objective: Vitals:   04/09/23 2112 04/10/23 0500 04/10/23 0754 04/10/23 1705  BP: 135/73  (!) 145/67 (!) 143/67  Pulse: 82  78 90  Resp: 20  18   Temp: 99.1 F (37.3 C)  98.3 F (36.8 C) 99.7 F (37.6 C)  TempSrc: Oral   Oral  SpO2: 98%  97% 99%  Weight:  92.7 kg    Height:        Intake/Output Summary (Last 24 hours) at 04/10/2023 1710 Last data filed at 04/10/2023 1402 Gross per 24 hour  Intake 1852.52 ml  Output 1850 ml  Net 2.52 ml   Filed Weights   04/08/23 0500 04/09/23 0500 04/10/23 0500  Weight: 93.5 kg  95.5 kg 92.7 kg    Physical Examination: Body mass index is 29.32 kg/m.   General:  Average built, not in obvious distress, weak and deconditioned, frail appearing slow speech.  Alert follows command. HENT:   Pallor noted, oral mucosa is moist.  Chest: .  Diminished breath sounds bilaterally.  Coarse breath sounds noted bilaterally, CVS: S1 &S2 heard. No murmur.  Regular rate and rhythm. Abdomen: Soft, nontender, nondistended.  Bowel sounds are heard.  Ostomy bag in place, suprapubic  foley catheter in place. Extremities: Dependent edema noted in upper extremities bilaterally. Psych: Alert, awake and follows commands, oriented to self. CNS:  No cranial nerve deficits.  Generalized weakness noted, bilateral lower extremity weakness Skin: Warm and dry.  Sacral and scrotal pressure injuries on presentation.  Data Reviewed:   CBC: Recent Labs  Lab 04/05/23 1904 04/06/23 0414 04/06/23 1151 04/07/23 0353 04/07/23 1035 04/08/23 0716 04/09/23 0506 04/10/23 0518  WBC 13.0* 13.2*  --  9.7  --  7.9 5.6 4.4  NEUTROABS 11.9*  --   --   --   --   --   --   --   HGB 8.8* 7.2*   < > 6.9* 8.2* 8.7* 8.6* 8.6*  HCT 29.9* 24.1*   < > 21.8* 25.4* 27.0* 26.8* 27.5*  MCV 90.3 88.9  --  85.2  --  86.0 85.6 86.5  PLT 470* 395  --  391  --  349 329 322   < > = values in this interval not displayed.    Basic Metabolic Panel: Recent Labs  Lab 04/06/23 0414 04/06/23 1825 04/07/23 0353 04/08/23 0716 04/09/23 0506 04/10/23 0518  NA 139  --  143 144 145 144  K 5.8* 4.4 4.4 4.0 3.9 3.8  CL 111  --  116* 116* 117* 117*  CO2 20*  --  22 21* 22 22  GLUCOSE 182*  --  118* 97 96 85  BUN 59*  --  58* 58* 50* 40*  CREATININE 2.78*  --  2.68* 2.13* 1.61* 1.33*  CALCIUM 7.8*  --  7.7* 7.6* 7.7* 7.7*  MG 2.6*  --  2.7* 2.5*  --   --   PHOS 5.2*  --  4.6 3.4  --   --     Liver Function Tests: Recent Labs  Lab 04/05/23 1904 04/06/23 0414 04/07/23 0353 04/08/23 0716  AST 45* 32  --   --   ALT  34 28  --   --   ALKPHOS 130* 108  --   --   BILITOT 0.8 0.8  --   --   PROT 7.5 6.5  --   --   ALBUMIN 2.3* 1.9* 1.9* 1.9*     Radiology Studies: No results found.    LOS: 5 days  Hypertension  Time spent 25 minutes  Darlin Drop, MD [facility] Available via Epic secure chat 7am-7pm After these hours, please refer to coverage provider listed on [email or web address] 04/10/2023, 5:10 PM

## 2023-04-10 NOTE — Plan of Care (Signed)
  Problem: Clinical Measurements: Goal: Respiratory complications will improve Outcome: Progressing   Problem: Nutrition: Goal: Adequate nutrition will be maintained Outcome: Progressing   Problem: Coping: Goal: Level of anxiety will decrease Outcome: Progressing   Problem: Pain Managment: Goal: General experience of comfort will improve Outcome: Progressing   

## 2023-04-10 NOTE — Evaluation (Signed)
Occupational Therapy Evaluation Patient Details Name: [name] MRN: [id number] DOB: 1941-03-21 Today's Date: 04/10/2023   History of Present Illness [name] is a 82 y.o. male with medical history significant for hypertension, hyperlipidemia, GERD, COPD, chronic diastolic CHF, seizure disorder, BPH, suprapubic catheter in place, history of pulmonary embolism on Eliquis, probable CKD stage II, bipolar disorder, schizophrenia, seizures, DVT, kidney stones, obesity. Patient has a chronic foley cath as well as an ostomy. He was noted to have pulse ox of 74% at the facility and EMS was called in. He was admitted for circulatory shock suspect hypovolemic plus minus sepsis with hypoxic respiratory failure suspected secondary to aspiration and was on vasopressor support.   Clinical Impression   Patient received for OT evaluation. See flowsheet below for details of function. Generally, patient requiring TOTAL A x2 for bed mobility, unable to perform functional mobility, and MAX A for most ADLs (set up from bed level for grooming and eating). Patient will benefit from continued OT while in acute care.        If plan is discharge home, recommend the following: Two people to help with walking and/or transfers;Two people to help with bathing/dressing/bathroom;Assistance with cooking/housework;Direct supervision/assist for medications management;Direct supervision/assist for financial management;Assist for transportation;Help with stairs or ramp for entrance;Supervision due to cognitive status    Functional Status Assessment  Patient has had a recent decline in their functional status and demonstrates the ability to make significant improvements in function in a reasonable and predictable amount of time.  Equipment Recommendations  None recommended by OT    Recommendations for Other Services       Precautions / Restrictions Precautions Precautions: Fall;Other (comment) Precaution  Comments: wound precautions (buttocks, feet) Restrictions Weight Bearing Restrictions: No      Mobility Bed Mobility Overal bed mobility: Needs Assistance Bed Mobility: Supine to Sit, Sit to Supine     Supine to sit: HOB elevated, +2 for safety/equipment, +2 for physical assistance, Total assist Sit to supine: +2 for physical assistance, Total assist, +2 for safety/equipment   General bed mobility comments: Patient is dependent x 2 for transition to/from EOB    Transfers                   General transfer comment: Did not attempt t/f today; unsafe for pt and therapists      Balance Overall balance assessment: Needs assistance Sitting-balance support: Feet supported, Bilateral upper extremity supported Sitting balance-Leahy Scale: Poor Sitting balance - Comments: Max A to maintain seated EOB position Postural control: Posterior lean     Standing balance comment: unable to perform                           ADL either performed or assessed with clinical judgement   ADL Overall ADL's : At baseline                                       General ADL Comments: Pt is able to use BIL UE functionally and flex shoulders against gravity, but fatigues quickly. Able to wash face with washcloth today; anticipate able to self feed. Anticipate dependent-MAX A for all other ADLs bed level at this time.     Vision Patient Visual Report: No change from baseline       Perception  Praxis         Pertinent Vitals/Pain Pain Assessment Pain Assessment: Faces Faces Pain Scale: Hurts even more Pain Intervention(s): Limited activity within patient's tolerance, Monitored during session     Extremity/Trunk Assessment Upper Extremity Assessment Upper Extremity Assessment: Generalized weakness   Lower Extremity Assessment Lower Extremity Assessment: Generalized weakness       Communication Communication Communication: Difficulty following  commands/understanding Cueing Techniques: Verbal cues;Tactile cues;Visual cues   Cognition Arousal: Alert Behavior During Therapy: Anxious Overall Cognitive Status: No family/caregiver present to determine baseline cognitive functioning                                 General Comments: No one present to assist with change of mental status. Limited cognition, aware of self. Pt is aware of person, year, states it is August (it is actually Sept 1); knows he lives at a SNF but not which one.     General Comments  Multiple toe wounds; pt with pain with any touching of toes. Left feet in prevlon boots during session today to protect feet/toes. RN in room through most of session. At beginning of session, assisted RN with MAX A x2 rolling.    Exercises     Shoulder Instructions      Home Living Family/patient expects to be discharged to:: Skilled nursing facility                                 Additional Comments: [facility]; pt states he's lived there for 4 years      Prior Functioning/Environment Prior Level of Function : Needs assist       Physical Assist : Mobility (physical);ADLs (physical)     Mobility Comments: Patient is a poor historian but reports he has not been out of bed for "a while". does state he sits up at the edge of the bed. ADLs Comments: Pt able to self feed and groom at bed level at baseline; assist bed level for all other ADLs.        OT Problem List: Decreased strength;Decreased activity tolerance;Impaired balance (sitting and/or standing);Decreased cognition;Decreased safety awareness      OT Treatment/Interventions: Self-care/ADL training;Therapeutic exercise;Patient/family education;Therapeutic activities    OT Goals(Current goals can be found in the care plan section) Acute Rehab OT Goals Patient Stated Goal: Feel better OT Goal Formulation: Patient unable to participate in goal setting Time For Goal Achievement:  04/24/23 Potential to Achieve Goals: Poor ADL Goals Pt Will Perform Grooming: with modified independence;sitting Pt Will Perform Upper Body Dressing: with modified independence;sitting Additional ADL Goal #1: Patient will maintain sitting balance on EOB without external support for 10 minutes for functional ADL activity from seated position by d/c.  OT Frequency: Min 1X/week    Co-evaluation   Reason for Co-Treatment: Complexity of the patient's impairments (multi-system involvement);Necessary to address cognition/behavior during functional activity;For patient/therapist safety;To address functional/ADL transfers PT goals addressed during session: Mobility/safety with mobility;Balance;Strengthening/ROM OT goals addressed during session: ADL's and self-care;Strengthening/ROM      AM-PAC OT "6 Clicks" Daily Activity     Outcome Measure Help from another person eating meals?: None Help from another person taking care of personal grooming?: A Little Help from another person toileting, which includes using toliet, bedpan, or urinal?: Total Help from another person bathing (including washing, rinsing, drying)?: Total Help from another person to put  on and taking off regular upper body clothing?: A Lot Help from another person to put on and taking off regular lower body clothing?: Total 6 Click Score: 12   End of Session Nurse Communication: Mobility status  Activity Tolerance: Patient limited by pain;Patient limited by fatigue Patient left: in bed;with call bell/phone within reach;with bed alarm set  OT Visit Diagnosis: Muscle weakness (generalized) (M62.81)                Time: 4098-1191 OT Time Calculation (min): 33 min Charges:  OT General Charges $OT Visit: 1 Visit OT Evaluation $OT Eval Moderate Complexity: 1 Mod OT Treatments $Therapeutic Activity: 8-22 mins  Linward Foster, MS, OTR/L  Alvester Morin 04/10/2023, 1:21 PM

## 2023-04-10 NOTE — Evaluation (Signed)
Physical Therapy Evaluation Patient Details Name: [name] MRN: [id number] DOB: [birth year] Today's Date: 04/10/2023  History of Present Illness  [name] is a 82 y.o. male with medical history significant for hypertension, hyperlipidemia, GERD, COPD, chronic diastolic CHF, seizure disorder, BPH, suprapubic catheter in place, history of pulmonary embolism on Eliquis, probable CKD stage II, bipolar disorder, schizophrenia, seizures, DVT, kidney stones, obesity. Patient has a chronic foley cath as well as an ostomy. He was noted to have pulse ox of 74% at the facility and EMS was called in. He was admitted for circulatory shock suspect hypovolemic plus minus sepsis with hypoxic respiratory failure suspected secondary to aspiration and was on vasopressor support.   Clinical Impression  Patient is a pleasantly confused 82 year old male who presents with global weakness and limited mobility. Prior to hospital admission, pt was living at [facility] and reliant upon staff for ADLS. Patient is a poor historian and majority of information pulled from record, patient is primarily bed bound but was using a wheelchair earlier this year.  Patient is in bed with OT present upon PT arrival. He has limited strength of BLE and has noticeable wounds on toes, nursing in room and notified, agreed to place request for wound care/podiatry to assess further. Patient requires dep A x2 person to transition from supine to EOB. Upon seated EOB he requires Max A to remain seated for approximately five minutes. He transitioned back to supine with dep x 2 person assist. Repositioned in bed with neds met. Pt would benefit from skilled PT to address noted impairments and functional limitations (see below for any additional details).           If plan is discharge home, recommend the following: Two people to help with walking and/or transfers;Two people to help with bathing/dressing/bathroom;Assistance with  cooking/housework;Assistance with feeding;Direct supervision/assist for financial management;Direct supervision/assist for medications management;Assist for transportation   Can travel by private vehicle   No    Equipment Recommendations None recommended by PT (return to SNF. may need hoyer transfer training.)  Recommendations for Other Services       Functional Status Assessment Patient has had a recent decline in their functional status and demonstrates the ability to make significant improvements in function in a reasonable and predictable amount of time.     Precautions / Restrictions Precautions Precautions: Other (comment) Precaution Comments: wound precautions (buttocks, feet) Restrictions Weight Bearing Restrictions: No      Mobility  Bed Mobility Overal bed mobility: Needs Assistance Bed Mobility: Supine to Sit, Sit to Supine     Supine to sit: HOB elevated, +2 for safety/equipment, +2 for physical assistance, Total assist Sit to supine: +2 for physical assistance, Total assist, +2 for safety/equipment   General bed mobility comments: Patient is dependent x 2 for transition to/from EOB    Transfers                        Ambulation/Gait                  Stairs            Wheelchair Mobility     Tilt Bed    Modified Rankin (Stroke Patients Only)       Balance Overall balance assessment: Needs assistance Sitting-balance support: Feet supported, Bilateral upper extremity supported Sitting balance-Leahy Scale: Poor Sitting balance - Comments: Max A to maintain seated EOB position Postural control: Posterior lean  Standing balance comment: unable to perform                             Pertinent Vitals/Pain Pain Assessment Pain Assessment: Faces Faces Pain Scale: Hurts even more Pain Location: feet and buttocks Pain Intervention(s): Monitored during session, Repositioned    Home Living Family/patient  expects to be discharged to:: Skilled nursing facility                   Additional Comments: [facility]    Prior Function Prior Level of Function : Needs assist       Physical Assist : Mobility (physical);ADLs (physical)     Mobility Comments: Patient is a poor historian but reports he has not been out of bed for a while. ADLs Comments: Helps with rolling for toileting and bed changes     Extremity/Trunk Assessment   Upper Extremity Assessment Upper Extremity Assessment: Defer to OT evaluation    Lower Extremity Assessment Lower Extremity Assessment: Generalized weakness (max A to maintain EOB position so unable to test fully. unable to bring legs to EOB without dependent assistance.)       Communication   Communication Cueing Techniques: Verbal cues;Gestural cues;Tactile cues;Visual cues  Cognition Arousal: Alert Behavior During Therapy: Anxious Overall Cognitive Status: History of cognitive impairments - at baseline                                 General Comments: No one present to assist with change of mental status. Limited cognition, aware of self.        General Comments General comments (skin integrity, edema, etc.): multiple wounds on toes    Exercises Other Exercises Other Exercises: Patient educated on role of PT in acute care setting, safe mobility and positioning.   Assessment/Plan    PT Assessment Patient needs continued PT services  PT Problem List Decreased strength;Decreased range of motion;Decreased activity tolerance;Decreased mobility;Decreased balance;Decreased cognition;Decreased safety awareness;Decreased skin integrity;Pain       PT Treatment Interventions Functional mobility training;Therapeutic activities;Therapeutic exercise;Cognitive remediation;Neuromuscular re-education;Balance training;Patient/family education;Wheelchair mobility training;Manual techniques    PT Goals (Current goals can be found in the  Care Plan section)  Acute Rehab PT Goals PT Goal Formulation: Patient unable to participate in goal setting    Frequency Min 1X/week     Co-evaluation PT/OT/SLP Co-Evaluation/Treatment: Yes Reason for Co-Treatment: Complexity of the patient's impairments (multi-system involvement);Necessary to address cognition/behavior during functional activity;For patient/therapist safety;To address functional/ADL transfers PT goals addressed during session: Mobility/safety with mobility;Balance;Strengthening/ROM OT goals addressed during session: ADL's and self-care;Strengthening/ROM       AM-PAC PT "6 Clicks" Mobility  Outcome Measure Help needed turning from your back to your side while in a flat bed without using bedrails?: Total Help needed moving from lying on your back to sitting on the side of a flat bed without using bedrails?: Total Help needed moving to and from a bed to a chair (including a wheelchair)?: Total Help needed standing up from a chair using your arms (e.g., wheelchair or bedside chair)?: Total Help needed to walk in hospital room?: Total Help needed climbing 3-5 steps with a railing? : Total 6 Click Score: 6    End of Session   Activity Tolerance: Patient limited by fatigue;Patient limited by pain Patient left: in bed;with call bell/phone within reach;with bed alarm set Nurse Communication: Mobility status;Other (comment) (need  for foot wound assessment) PT Visit Diagnosis: Other abnormalities of gait and mobility (R26.89);Muscle weakness (generalized) (M62.81);Adult, failure to thrive (R62.7);Pain Pain - Right/Left:  (bilateral) Pain - part of body:  (feet)    Time: 2841-3244 PT Time Calculation (min) (ACUTE ONLY): 23 min   Charges:   PT Evaluation $PT Eval Moderate Complexity: 1 Mod   PT General Charges $$ ACUTE PT VISIT: 1 Visit       Precious Bard, PT, DPT Physical Therapist - [facility]    04/10/2023, 12:32 PM

## 2023-04-11 DIAGNOSIS — R6521 Severe sepsis with septic shock: Secondary | ICD-10-CM | POA: Diagnosis not present

## 2023-04-11 DIAGNOSIS — A419 Sepsis, unspecified organism: Secondary | ICD-10-CM | POA: Diagnosis not present

## 2023-04-11 LAB — CBC
HCT: 28.8 % — ABNORMAL LOW (ref 39.0–52.0)
Hemoglobin: 9.1 g/dL — ABNORMAL LOW (ref 13.0–17.0)
MCH: 27.2 pg (ref 26.0–34.0)
MCHC: 31.6 g/dL (ref 30.0–36.0)
MCV: 86.2 fL (ref 80.0–100.0)
Platelets: 316 10*3/uL (ref 150–400)
RBC: 3.34 MIL/uL — ABNORMAL LOW (ref 4.22–5.81)
RDW: 16.5 % — ABNORMAL HIGH (ref 11.5–15.5)
WBC: 4.8 10*3/uL (ref 4.0–10.5)
nRBC: 0 % (ref 0.0–0.2)

## 2023-04-11 LAB — GLUCOSE, CAPILLARY
Glucose-Capillary: 101 mg/dL — ABNORMAL HIGH (ref 70–99)
Glucose-Capillary: 134 mg/dL — ABNORMAL HIGH (ref 70–99)
Glucose-Capillary: 74 mg/dL (ref 70–99)
Glucose-Capillary: 78 mg/dL (ref 70–99)
Glucose-Capillary: 94 mg/dL (ref 70–99)

## 2023-04-11 MED ORDER — LACTATED RINGERS IV SOLN: INTRAVENOUS | Status: AC

## 2023-04-11 NOTE — Progress Notes (Signed)
PROGRESS NOTE    [name]  WUJ:[id number] DOB: 06/08/1941 DOA: 04/05/2023 PCP: System, Provider Not In    Brief Narrative:  Patient is 82 years old male with past medical history of schizophrenia, bipolar disorder, CKD stage IIIa, COPD, DVT, GERD, hyperlipidemia, hypertension, heart failure with preserved ejection fraction, BPH with chronic Foley and stage II pressure injury presented from skilled nursing facility for evaluation of hypoxia.  Patient was reported to have not felt well and using accessory muscles of respiration.  He was noted to have pulse ox of 74% at the facility and EMS was called in.  Patient was then brought into with nonrebreather mask en route to the hospital.  Patient was then admitted hospital for circulatory shock suspect hypovolemic plus minus sepsis with hypoxic respiratory failure suspected secondary to aspiration and was on vasopressor support.  Subsequently, patient was transferred out of the ICU.  Of note, patient was recently hospitalized in May 2024 for severe sepsis secondary to UTI from chronic Foley catheter.  Palliative care also saw the patient during hospitalization.  Patient does not have family in currently legal guardianship underway.  Blood cultures with ESBL Klebsiella on IV Merrem.  Merrem started on 04/06/2023.  04/11/2023: The patient was seen and examined at his bedside.   Assessment and Plan:  Resolved septic shock present on admission likely secondary to soft tissue infection/UTI, Klebsiella bacteremia.  Patient had sacral and scrotal cellulitis.  Initially required vasopressor and albumin support.  Off vasopressor support at this time.  Blood pressure has improved..  Patient was initially on  IV Merrem and linezolid.  Currently off linezolid.  Sepsis physiology has improved.  Acute metabolic encephalopathy on the background of vascular dementia, seizure disorder. Continue phenobarbital from outpatient.  Continue Abilify, bupropion, trazodone.   Appears to be at his baseline.  Mood is appropriate, follows commands.   ESBL Klebsiella bacteremia/Klebsiella UTI, POA.  Urine culture showing Klebsiella pneumonia ESBL.  On IV Merrem.  Leukocytosis has improved.   Follow repeat blood cultures done on 04/11/2023.  Chronic normocytic anemia.  Hemoglobin of 6.9.  Received 1 unit of packed RBC.  Repeat hemoglobin of 8.6. Hemoglobin stable, no reported overt bleeding at this time.  Left  lung pneumonia. Has completed course of antibiotic.  Currently on Merrem. off linezolid.  Continue BiPAP as needed, pulmonary hygiene.  MRSA PCR positive.  Urinary strep antigen negative.  Chronic sacral and scrotal ulcer.  Continue wound care.  Mild AKI on CKD stage IIIa.  Improved with IV fluids, currently off IV fluids.   Lab Results  Component Value Date   CREATININE 1.33 (H) 04/10/2023   CREATININE 1.61 (H) 04/09/2023   CREATININE 2.13 (H) 04/08/2023     Debility, frailty, deconditioning.  Palliative care on board.  Legal guardianship underway.  Overall very poor prognosis.  Seen by PT OT requiring 2 people assist.  Needs direct supervision and assist.  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  Suprapubic Foley catheter leakage.  Patient had pain when trying to exchange his suprapubic catheter -the catheter was exchanged by Dr. Lonna Cobb on 8/30   DVT prophylaxis: apixaban (ELIQUIS) tablet 2.5 mg Start: 04/07/23 1000 SCDs Start: 04/05/23 2240 apixaban (ELIQUIS) tablet 2.5 mg   Code Status:     Code Status: Full Code  Disposition: Uncertain at this time.  TOC on board.  He is a long-term resident of [facility] should be able to go back.  Status is: Inpatient  Remains inpatient appropriate because:  Multiple comorbidities, IV antibiotic, pending clinical improvement, needs guardianship and disposition plan.   Family Communication: No contact person available  Consultants:  PCCM  Procedures:  PRBC  transfusion  Antimicrobials:  Merrem IV  Anti-infectives (From admission, onward)    Start     Dose/Rate Route Frequency Ordered Stop   04/10/23 2200  meropenem (MERREM) 1 g in sodium chloride 0.9 % 100 mL IVPB        1 g 200 mL/hr over 30 Minutes Intravenous Every 8 hours 04/10/23 1439     04/06/23 2245  meropenem (MERREM) 1 g in sodium chloride 0.9 % 100 mL IVPB  Status:  Discontinued        1 g 200 mL/hr over 30 Minutes Intravenous Every 12 hours 04/06/23 2147 04/10/23 1439   04/06/23 0200  linezolid (ZYVOX) IVPB 600 mg  Status:  Discontinued        600 mg 300 mL/hr over 60 Minutes Intravenous Every 12 hours 04/06/23 0140 04/07/23 1158   04/06/23 0115  Ampicillin-Sulbactam (UNASYN) 3 g in sodium chloride 0.9 % 100 mL IVPB  Status:  Discontinued        3 g 200 mL/hr over 30 Minutes Intravenous Every 12 hours 04/06/23 0023 04/06/23 2145   04/05/23 1945  vancomycin (VANCOCIN) IVPB 1000 mg/200 mL premix  Status:  Discontinued        1,000 mg 200 mL/hr over 60 Minutes Intravenous  Once 04/05/23 1935 04/05/23 1940   04/05/23 1945  levofloxacin (LEVAQUIN) IVPB 750 mg        750 mg 100 mL/hr over 90 Minutes Intravenous  Once 04/05/23 1935 04/05/23 2150   04/05/23 1945  vancomycin (VANCOREADY) IVPB 2000 mg/400 mL        2,000 mg 200 mL/hr over 120 Minutes Intravenous  Once 04/05/23 1941 04/05/23 2230        Objective: Vitals:   04/11/23 0402 04/11/23 0500 04/11/23 0818 04/11/23 1618  BP: (!) 145/72  (!) 141/79 (!) 158/87  Pulse: 82  84 82  Resp: 19  18 18   Temp: 97.8 F (36.6 C)  98 F (36.7 C) 98 F (36.7 C)  TempSrc:      SpO2: 94%  94% 98%  Weight:  96.1 kg    Height:        Intake/Output Summary (Last 24 hours) at 04/11/2023 1736 Last data filed at 04/11/2023 1640 Gross per 24 hour  Intake 960.07 ml  Output 1200 ml  Net -239.93 ml   Filed Weights   04/09/23 0500 04/10/23 0500 04/11/23 0500  Weight: 95.5 kg 92.7 kg 96.1 kg    Physical Examination: Body mass  index is 30.4 kg/m.   General:  Average built, not in obvious distress, weak and deconditioned, frail appearing slowed speech.  Alert follows command. HENT:   Pallor noted, oral mucosa is moist.  Chest: .  Diminished breath sounds bilaterally.  Coarse breath sounds noted bilaterally, CVS: S1 &S2 heard. No murmur.  Regular rate and rhythm. Abdomen: Soft, nontender, nondistended.  Bowel sounds are heard.  Ostomy bag in place, suprapubic  foley catheter in place. Extremities: Dependent edema in upper extremities bilaterally. Psych: Alert, awake and follows commands, oriented to self. CNS:  No cranial nerve deficits.  Generalized weakness noted, bilateral lower extremity weakness Skin: Warm and dry.  Sacral and scrotal pressure injuries on presentation.  Data Reviewed:   CBC: Recent Labs  Lab 04/05/23 1904 04/06/23 1610 04/07/23 9604 04/07/23 1035 04/08/23 5409 04/09/23 0506 04/10/23 0518  04/11/23 0420  WBC 13.0*   < > 9.7  --  7.9 5.6 4.4 4.8  NEUTROABS 11.9*  --   --   --   --   --   --   --   HGB 8.8*   < > 6.9* 8.2* 8.7* 8.6* 8.6* 9.1*  HCT 29.9*   < > 21.8* 25.4* 27.0* 26.8* 27.5* 28.8*  MCV 90.3   < > 85.2  --  86.0 85.6 86.5 86.2  PLT 470*   < > 391  --  349 329 322 316   < > = values in this interval not displayed.    Basic Metabolic Panel: Recent Labs  Lab 04/06/23 0414 04/06/23 1825 04/07/23 0353 04/08/23 0716 04/09/23 0506 04/10/23 0518  NA 139  --  143 144 145 144  K 5.8* 4.4 4.4 4.0 3.9 3.8  CL 111  --  116* 116* 117* 117*  CO2 20*  --  22 21* 22 22  GLUCOSE 182*  --  118* 97 96 85  BUN 59*  --  58* 58* 50* 40*  CREATININE 2.78*  --  2.68* 2.13* 1.61* 1.33*  CALCIUM 7.8*  --  7.7* 7.6* 7.7* 7.7*  MG 2.6*  --  2.7* 2.5*  --   --   PHOS 5.2*  --  4.6 3.4  --   --     Liver Function Tests: Recent Labs  Lab 04/05/23 1904 04/06/23 0414 04/07/23 0353 04/08/23 0716  AST 45* 32  --   --   ALT 34 28  --   --   ALKPHOS 130* 108  --   --   BILITOT 0.8  0.8  --   --   PROT 7.5 6.5  --   --   ALBUMIN 2.3* 1.9* 1.9* 1.9*     Radiology Studies: No results found.    LOS: 6 days  Hypertension  Time spent 25 minutes  Darlin Drop, MD [facility] Available via Epic secure chat 7am-7pm After these hours, please refer to coverage provider listed on [email or web address] 04/11/2023, 5:36 PM

## 2023-04-12 DIAGNOSIS — A419 Sepsis, unspecified organism: Secondary | ICD-10-CM | POA: Diagnosis not present

## 2023-04-12 DIAGNOSIS — R6521 Severe sepsis with septic shock: Secondary | ICD-10-CM | POA: Diagnosis not present

## 2023-04-12 LAB — ECHOCARDIOGRAM COMPLETE
AR max vel: 1.1 cm2
AV Area VTI: 1.3 cm2
AV Area mean vel: 1.04 cm2
AV Mean grad: 29 mmHg
AV Peak grad: 47.9 mmHg
Ao pk vel: 3.46 m/s
Area-P 1/2: 4.26 cm2
Height: 70 in
MV VTI: 3.3 cm2
S' Lateral: 3.5 cm
Weight: 3216.95 [oz_av]

## 2023-04-12 LAB — GLUCOSE, CAPILLARY
Glucose-Capillary: 77 mg/dL (ref 70–99)
Glucose-Capillary: 115 mg/dL — ABNORMAL HIGH (ref 70–99)
Glucose-Capillary: 97 mg/dL (ref 70–99)
Glucose-Capillary: 138 mg/dL — ABNORMAL HIGH (ref 70–99)

## 2023-04-12 MED ORDER — SODIUM CHLORIDE 0.9% FLUSH: 10.0000 mL | INTRAVENOUS | Status: DC | PRN

## 2023-04-12 MED ORDER — PANTOPRAZOLE SODIUM 40 MG PO TBEC: 40.0000 mg | DELAYED_RELEASE_TABLET | Freq: Every day | ORAL | 0 refills | Status: AC

## 2023-04-12 MED ORDER — MEDIHONEY WOUND/BURN DRESSING EX PSTE: 1.0000 | PASTE | Freq: Every day | CUTANEOUS | 0 refills | Status: AC

## 2023-04-12 MED ORDER — CARVEDILOL 3.125 MG PO TABS
3.1250 mg | ORAL_TABLET | Freq: Two times a day (BID) | ORAL | Status: DC
  Administered 2023-04-12 – 2023-04-13 (×2): 3.125 mg via ORAL
  Filled 2023-04-12 (×4): qty 1

## 2023-04-12 MED ORDER — SODIUM CHLORIDE 0.9% FLUSH
10.0000 mL | Freq: Two times a day (BID) | INTRAVENOUS | Status: DC
  Administered 2023-04-12 – 2023-04-13 (×2): 10 mL

## 2023-04-12 MED ORDER — CARVEDILOL 3.125 MG PO TABS: 25.0000 mg | ORAL_TABLET | Freq: Two times a day (BID) | ORAL | 0 refills | Status: AC

## 2023-04-12 NOTE — Plan of Care (Signed)
  Problem: Nutrition: Goal: Adequate nutrition will be maintained Outcome: Progressing   Problem: Coping: Goal: Level of anxiety will decrease Outcome: Progressing   Problem: Pain Managment: Goal: General experience of comfort will improve Outcome: Progressing   

## 2023-04-12 NOTE — NC FL2 (Signed)
[place] MEDICAID FL2 LEVEL OF CARE FORM     IDENTIFICATION  Patient Name: [name] Birthdate: Oct 31, 1940 Sex: male Admission Date (Current Location): 04/05/2023  [facility] and [place] Number:  Chiropodist and Address:  [facility], [address], [place], [place] [address]      Provider Number: [id number]  Attending Physician Name and Address:  Darlin Drop, DO  Relative Name and Phone Number:       Current Level of Care: Hospital Recommended Level of Care: Skilled Nursing Facility Prior Approval Number:    Date Approved/Denied:   PASRR Number: [id number]  Discharge Plan: SNF    Current Diagnoses: Patient Active Problem List   Diagnosis Date Noted   Shock circulatory (HCC) 04/06/2023   Severe sepsis with septic shock (HCC) 04/05/2023   HCAP (healthcare-associated pneumonia) 04/05/2023   Complicated UTI (urinary tract infection) 04/05/2023   Acute on chronic respiratory failure with hypoxia (HCC) 04/05/2023   Hyperkalemia 04/05/2023   Acute renal failure superimposed on stage 3a chronic kidney disease (HCC) 04/05/2023   Decubitus ulcer of right buttock, stage 2 (HCC) 12/21/2022   Pyelonephritis 12/19/2022   History of pulmonary embolus (PE) 06/06/2022   Metabolic acidosis, normal anion gap (NAG) 05/15/2022   Sigmoid volvulus (HCC) 05/12/2022   Acute deep vein thrombosis (DVT) of left lower extremity (HCC)    Acute respiratory failure with hypoxia (HCC)    Malnutrition of moderate degree 05/03/2022   COPD exacerbation (HCC) 05/02/2022   Pulmonary embolism (HCC) 05/02/2022   Dysphagia 01/12/2022   Cellulitis of left lower extremity 01/10/2022   Hypomagnesemia 01/10/2022   Hypertension    Depression    Chronic diastolic CHF (congestive heart failure) (HCC)    Chronic kidney disease, stage 3a (HCC)    Acute metabolic encephalopathy    Anemia    Hematuria 11/25/2016   Guaiac positive stools 11/25/2016   Pressure  injury of skin 09/23/2016   Back pain 05/28/2016   Severe sepsis (HCC) 05/27/2016   UTI (urinary tract infection) 05/27/2016   DVT of lower extremity, bilateral (HCC) 05/27/2016   Bladder outlet obstruction 05/27/2016   BPH (benign prostatic hyperplasia) 05/27/2016   AKI (acute kidney injury) (HCC) 05/27/2016   Bipolar disorder (HCC) 05/27/2016   Generalized weakness 05/27/2016   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) 08/07/2015   Hypokalemia 02/21/2015   Venous insufficiency of both lower extremities 12/04/2014   Benign essential hypertension 11/25/2014   Bilateral carotid artery stenosis 11/15/2014   Bilateral leg edema 11/15/2014   COPD (chronic obstructive pulmonary disease) (HCC) 11/15/2014   Gastroesophageal reflux disease 11/15/2014   Mild aortic valve stenosis 11/15/2014   Mixed hyperlipidemia 11/15/2014   Obesity 11/15/2014   Seizure disorder (HCC) 11/15/2014    Orientation RESPIRATION BLADDER Height & Weight     Self, Time  Normal (Bipap order discontinued on 8/28.) Continent, Indwelling catheter (Suprapubic catheter) Weight: 194 lb 9.6 oz (88.3 kg) Height:  5\' 10"  (177.8 cm)  BEHAVIORAL SYMPTOMS/MOOD NEUROLOGICAL BOWEL NUTRITION STATUS   (None)  (Seizure disorder) Continent, Colostomy Diet (DYS 1)  AMBULATORY STATUS COMMUNICATION OF NEEDS Skin   Extensive Assist Verbally Bruising, Other (Comment) (Erythema/redness. Unstageable pressure injury on right buttocks: ABD, wet-to-dry, honey daily. Unstageable pressure injury on right thigh: ABD, wet-to-dry, honey daily. Unstageable on right scrotum: honey, wet-to-dry daily.)  Personal Care Assistance Level of Assistance  Bathing, Feeding, Dressing Bathing Assistance: Maximum assistance Feeding assistance: Limited assistance Dressing Assistance: Maximum assistance     Functional Limitations Info  Sight, Hearing, Speech  Sight Info: Adequate Hearing Info: Adequate Speech Info: Impaired (Slurred/Dysarthria)    SPECIAL CARE FACTORS FREQUENCY  PT (By licensed PT), OT (By licensed OT)     PT Frequency: 5 x week OT Frequency: 5 x week            Contractures Contractures Info: Not present    Additional Factors Info  Code Status, Allergies, Isolation Precautions Code Status Info: Full code Allergies Info: Penicillin G     Isolation Precautions Info: Contact precautions: ESBL, VRE, MRSA     Current Medications (04/12/2023):  This is the current hospital active medication list Current Facility-Administered Medications  Medication Dose Route Frequency Provider Last Rate Last Admin   0.9 %  sodium chloride infusion (Manually program via Guardrails IV Fluids)   Intravenous Once Pokhrel, Laxman, MD       0.9 %  sodium chloride infusion  250 mL Intravenous Continuous Rust-Chester, Cecelia Byars, NP   Stopped at 04/08/23 1602   0.9 %  sodium chloride infusion   Intravenous PRN Pokhrel, Laxman, MD   Stopped at 04/10/23 1033   apixaban (ELIQUIS) tablet 2.5 mg  2.5 mg Oral BID Rust-Chester, Micheline Rough L, NP   2.5 mg at 04/12/23 1034   ARIPiprazole (ABILIFY) tablet 20 mg  20 mg Oral q AM Rust-Chester, Micheline Rough L, NP   20 mg at 04/12/23 1033   buPROPion (WELLBUTRIN XL) 24 hr tablet 300 mg  300 mg Oral Daily Rust-Chester, Britton L, NP   300 mg at 04/12/23 1033   carvedilol (COREG) tablet 3.125 mg  3.125 mg Oral BID WC Hall, Carole N, DO   3.125 mg at 04/12/23 1507   docusate sodium (COLACE) capsule 100 mg  100 mg Oral BID PRN Rust-Chester, Micheline Rough L, NP       fentaNYL (SUBLIMAZE) injection 12.5 mcg  12.5 mcg Intravenous Q2H PRN Harlon Ditty D, NP   12.5 mcg at 04/06/23 1534   finasteride (PROSCAR) tablet 5 mg  5 mg Oral Daily Pokhrel, Laxman, MD   5 mg at 04/12/23 1033   fluticasone furoate-vilanterol (BREO ELLIPTA) 100-25 MCG/ACT 1 puff  1 puff Inhalation Daily Vida Rigger, MD   1 puff at 04/12/23 1037    gabapentin (NEURONTIN) capsule 100 mg  100 mg Oral TID Pokhrel, Laxman, MD   100 mg at 04/12/23 1034   insulin aspart (novoLOG) injection 0-9 Units  0-9 Units Subcutaneous TID AC & HS Rust-Chester, Britton L, NP   1 Units at 04/10/23 2234   ipratropium-albuterol (DUONEB) 0.5-2.5 (3) MG/3ML nebulizer solution 3 mL  3 mL Nebulization Q4H PRN Delfino Lovett, MD       lactated ringers infusion   Intravenous Continuous Dow Adolph N, DO 30 mL/hr at 04/12/23 1152 New Bag at 04/12/23 1152   leptospermum manuka honey (MEDIHONEY) paste 1 Application  1 Application Topical Daily Vida Rigger, MD   1 Application at 04/12/23 1030   meropenem (MERREM) 1 g in sodium chloride 0.9 % 100 mL IVPB  1 g Intravenous Q8H Nazari, Walid A, RPH 200 mL/hr at 04/12/23 1505 1 g at 04/12/23 1505   pantoprazole (PROTONIX) EC tablet 40 mg  40 mg Oral Daily Tressie Ellis, RPH   40 mg at 04/12/23 1033   PHENobarbital (LUMINAL) tablet 64.8 mg  64.8 mg Oral  Daily Tressie Ellis, RPH   64.8 mg at 04/12/23 1033   polyethylene glycol (MIRALAX / GLYCOLAX) packet 17 g  17 g Oral Daily PRN Rust-Chester, Micheline Rough L, NP       pravastatin (PRAVACHOL) tablet 10 mg  10 mg Oral QHS Rust-Chester, Britton L, NP   10 mg at 04/11/23 2237   traZODone (DESYREL) tablet 50 mg  50 mg Oral QHS Rust-Chester, Britton L, NP   50 mg at 04/11/23 2237   umeclidinium bromide (INCRUSE ELLIPTA) 62.5 MCG/ACT 1 puff  1 puff Inhalation Daily Vida Rigger, MD   1 puff at 04/12/23 1033     Discharge Medications: Please see discharge summary for a list of discharge medications.  Relevant Imaging Results:  Relevant Lab Results:   Additional Information SS# [id number]  Margarito Liner, LCSW

## 2023-04-12 NOTE — Discharge Summary (Addendum)
Discharge Summary  [name] EAV:[id number] DOB: 1941-03-04  PCP: System, Provider Not In  Admit date: 04/05/2023 Discharge date: 04/13/2023  Time spent: 35 minutes   Recommendations for Outpatient Follow-up:  Follow up with your PCP. Follow-up with infectious disease. Take your medications as prescribed.  Discharge Diagnoses:  Active Hospital Problems   Diagnosis Date Noted   Severe sepsis with septic shock (HCC) 04/05/2023    Priority: High   HCAP (healthcare-associated pneumonia) 04/05/2023    Priority: 1.   Acute on chronic respiratory failure with hypoxia (HCC) 04/05/2023    Priority: 1.   Complicated UTI (urinary tract infection) 04/05/2023    Priority: 2.   Acute metabolic encephalopathy     Priority: 3.   Chronic diastolic CHF (congestive heart failure) (HCC)     Priority: 4.   COPD (chronic obstructive pulmonary disease) (HCC) 11/15/2014    Priority: 6.   Seizure disorder (HCC) 11/15/2014    Priority: 7.   History of pulmonary embolus (PE) 06/06/2022    Priority: 9.   DVT of lower extremity, bilateral (HCC) 05/27/2016    Priority: 9.   Hyperkalemia 04/05/2023    Priority: 10.   Acute renal failure superimposed on stage 3a chronic kidney disease (HCC) 04/05/2023    Priority: 10.   Hypertension     Priority: 10.   BPH (benign prostatic hyperplasia) 05/27/2016    Priority: 13.   Bipolar disorder (HCC) 05/27/2016    Priority: 14.   Shock circulatory (HCC) 04/06/2023    Resolved Hospital Problems  No resolved problems to display.    Discharge Condition: Stable.  Diet recommendation: Resume previous diet.  Vitals:   04/12/23 0500 04/12/23 0900  BP: (!) 164/83 (!) 152/79  Pulse: 79 91  Resp: 18 (!) 22  Temp: 98 F (36.7 C) 98.4 F (36.9 C)  SpO2: 100% 98%    History of present illness:  Patient is 82 years old male with past medical history of schizophrenia, bipolar disorder, CKD stage IIIa, COPD, DVT on Eliquis, GERD, hyperlipidemia,  hypertension, heart failure with preserved ejection fraction, BPH with chronic Foley and stage II pressure injury presented from skilled nursing facility for evaluation of acute hypoxia.   Workup revealed septic shock secondary to ESBL Klebsiella bacteremia, Providencia stuartii bacteremia, Klebsiella pneumonia and Proteus mirabilis UTI, and suspected aspiration pneumonia, sacral and scrotal cellulitis.  The patient was admitted to the ICU for vasopressors.  Later transferred to [facility], hospitalist service.  The patient was seen by wound care specialist.  Local wound care with the guidance of wound care specialist.  Evaluated by speech therapist, recommended dysphagia 1 diet, pured and honey thick liquid with aspiration precautions.  Currently on day #6/7 of Merrem.  Infectious disease consulted to assist with the management.  04/12/2023: The patient was seen and examined at his bedside.  There were no acute events overnight.  He has no new complaints.  Patient has guardianship due to lack of capacity at this time.  Plan to discharge back to SNF once infectious disease signs off, likely tomorrow after completion of 7/7 dose of Merrem.   04/13/2023: pt seen and examined, no acute complaints or events reported.  Antibiotics completed. Patient is medically stable for discharge to SNF today.   Hospital Course:  Active Problems:   Severe sepsis with septic shock (HCC)   HCAP (healthcare-associated pneumonia)   Acute on chronic respiratory failure with hypoxia (HCC)   Complicated UTI (urinary tract infection)   Acute metabolic encephalopathy  Chronic diastolic CHF (congestive heart failure) (HCC)   COPD (chronic obstructive pulmonary disease) (HCC)   Seizure disorder (HCC)   DVT of lower extremity, bilateral (HCC)   History of pulmonary embolus (PE)   Hypertension   Hyperkalemia   Acute renal failure superimposed on stage 3a chronic kidney disease (HCC)   BPH (benign prostatic hyperplasia)    Bipolar disorder (HCC)   Shock circulatory (HCC)  Resolved septic shock, multifactorial:  Sepsis physiology has resolved. ESBL Klebsiella bacteremia, Providencia stuartii bacteremia, Klebsiella pneumonia and Proteus mirabilis UTI in setting of suprapubic cathter, and suspected aspiration pneumonia, sacral and scrotal cellulitis. Completed 7 day course of IV antibiotics with meropenem x 6 days, day 7 ertapenem Case discussed with Infectious disease and agreed with course.   Resolved acute metabolic encephalopathy  History of seizure disorder Stable.  Continue home regimen   ESBL Klebsiella bacteremia, Providencia stuartii bacteremia, Klebsiella pneumonia and Proteus mirabilis UTI, and suspected aspiration pneumonia, sacral and scrotal cellulitis, poa. Continue IV antibiotics Rest of management per infectious disease.   Chronic normocytic anemia.   Received 1 unit PRBC for hemoglobin of 6.9. Last hemoglobin 9.1.  No overt bleeding reported   Left lung pneumonia with concern for possible aspiration. In addition to Merrem received linezolid.  Currently off linezolid. Continue incentive spirometer   Chronic sacral and scrotal pressure ulcer.  Continue wound care with the guidance of wound care specialist.   Resolved AKI on CKD stage IIIa.  Back to baseline renal function.  Debility, frailty, deconditioning.   Palliative care on board. Overall very poor prognosis.  Seen by PT OT requiring 2 people assist.  Needs direct supervision and assist.   History of pulmonary embolism Continue anticoagulation with Eliquis.   Suprapubic Foley catheter leakage.   Patient had pain when trying to exchange his suprapubic catheter -the catheter was exchanged by Dr. Lonna Cobb on 8/30    DVT prophylaxis: apixaban (ELIQUIS) tablet 2.5 mg Start: 04/07/23 1000 SCDs Start: 04/05/23 2240 apixaban (ELIQUIS) tablet 2.5 mg    Code Status:     Code Status: Full Code   Disposition: Uncertain at this time.   TOC on board.  He is a long-term resident of [facility].   Status is: Inpatient   Remains inpatient appropriate because: Multiple comorbidities, IV antibiotic, pending clinical improvement, needs guardianship and disposition plan.    Family Communication: No contact person available   Consultants:  PCCM Infectious disease Wound care specialist.   Procedures:  PRBC transfusion   Antimicrobials:  Merrem IV  DIET - Dysphagia 1 (pureed) with HONEY thickened liquids   Anti-infectives (From admission, onward)        Start     Dose/Rate Route Frequency Ordered Stop    04/10/23 2200   meropenem (MERREM) 1 g in sodium chloride 0.9 % 100 mL IVPB        1 g 200 mL/hr over 30 Minutes Intravenous Every 8 hours 04/10/23 1439      04/06/23 2245   meropenem (MERREM) 1 g in sodium chloride 0.9 % 100 mL IVPB  Status:  Discontinued        1 g 200 mL/hr over 30 Minutes Intravenous Every 12 hours 04/06/23 2147 04/10/23 1439    04/06/23 0200   linezolid (ZYVOX) IVPB 600 mg  Status:  Discontinued        600 mg 300 mL/hr over 60 Minutes Intravenous Every 12 hours 04/06/23 0140 04/07/23 1158    04/06/23 0115   Ampicillin-Sulbactam (UNASYN) 3  g in sodium chloride 0.9 % 100 mL IVPB  Status:  Discontinued        3 g 200 mL/hr over 30 Minutes Intravenous Every 12 hours 04/06/23 0023 04/06/23 2145    04/05/23 1945   vancomycin (VANCOCIN) IVPB 1000 mg/200 mL premix  Status:  Discontinued        1,000 mg 200 mL/hr over 60 Minutes Intravenous  Once 04/05/23 1935 04/05/23 1940    04/05/23 1945   levofloxacin (LEVAQUIN) IVPB 750 mg        750 mg 100 mL/hr over 90 Minutes Intravenous  Once 04/05/23 1935 04/05/23 2150    04/05/23 1945   vancomycin (VANCOREADY) IVPB 2000 mg/400 mL        2,000 mg 200 mL/hr over 120 Minutes Intravenous  Once 04/05/23 1941 04/05/23 2230            Discharge Exam: BP (!) 152/79 (BP Location: Right Arm)   Pulse 91   Temp 98.4 F (36.9 C) (Oral)   Resp (!) 22   Ht  5\' 10"  (1.778 m)   Wt 88.3 kg   SpO2 98%   BMI 27.92 kg/m  General: 82 y.o. year-old male well developed well nourished in no acute distress.  Alert and interactive. Cardiovascular: Regular rate and rhythm with no rubs or gallops.  No thyromegaly or JVD noted.   Respiratory: Clear to auscultation with no wheezes or rales. Good inspiratory effort. Abdomen: Soft nontender nondistended with normal bowel sounds x4 quadrants. Musculoskeletal: No lower extremity edema.  Skin: Sacral decubitus ulcers Psychiatry: Mood is appropriate for condition and setting  Discharge Instructions You were cared for by a hospitalist during your hospital stay. If you have any questions about your discharge medications or the care you received while you were in the hospital after you are discharged, you can call the unit and asked to speak with the hospitalist on call if the hospitalist that took care of you is not available. Once you are discharged, your primary care physician will handle any further medical issues. Please note that NO REFILLS for any discharge medications will be authorized once you are discharged, as it is imperative that you return to your primary care physician (or establish a relationship with a primary care physician if you do not have one) for your aftercare needs so that they can reassess your need for medications and monitor your lab values.   Allergies as of 04/12/2023       Reactions   Penicillin G Rash, Other (See Comments)   Has tolerated cephalosporins, zosyn, and augmentin        Medication List     STOP taking these medications    HYDROcodone-acetaminophen 5-325 MG tablet Commonly known as: NORCO/VICODIN   Suvorexant 5 MG Tabs       TAKE these medications    albuterol 108 (90 Base) MCG/ACT inhaler Commonly known as: VENTOLIN HFA Inhale 2 puffs into the lungs every 6 (six) hours as needed for wheezing or shortness of breath.   ammonium lactate 12 % cream Commonly  known as: AMLACTIN Apply 1 Application topically every 12 (twelve) hours.   ARIPiprazole 20 MG tablet Commonly known as: ABILIFY Take 20 mg by mouth in the morning.   ascorbic acid 500 MG tablet Commonly known as: VITAMIN C Take 1 tablet (500 mg total) by mouth 2 (two) times daily.   buPROPion 300 MG 24 hr tablet Commonly known as: Budeprion XL Take 1 tablet (300 mg total) by  mouth daily.   carvedilol 3.125 MG tablet Commonly known as: COREG Take 8 tablets (25 mg total) by mouth 2 (two) times daily with a meal. What changed:  medication strength when to take this   cetirizine 10 MG tablet Commonly known as: ZYRTEC Take 10 mg by mouth in the morning.   cholecalciferol 25 MCG (1000 UNIT) tablet Commonly known as: VITAMIN D3 Take 1,000 Units by mouth in the morning.   Cranberry 250 MG Caps Take 250 mg by mouth 2 (two) times daily.   Eliquis 2.5 MG Tabs tablet Generic drug: apixaban Take 2.5 mg by mouth 2 (two) times daily.   ferrous sulfate 325 (65 FE) MG tablet Take 325 mg by mouth daily with breakfast.   finasteride 5 MG tablet Commonly known as: PROSCAR Take 1 tablet (5 mg total) by mouth daily.   fluticasone furoate-vilanterol 100-25 MCG/ACT Aepb Commonly known as: BREO ELLIPTA Inhale 1 puff into the lungs daily.   furosemide 20 MG tablet Commonly known as: LASIX Take 1 tablet (20 mg total) by mouth 2 (two) times daily.   gabapentin 100 MG capsule Commonly known as: NEURONTIN Take 100 mg by mouth 3 (three) times daily.   guaiFENesin 100 MG/5ML liquid Commonly known as: ROBITUSSIN Take 5 mLs by mouth every 4 (four) hours as needed for cough or to loosen phlegm.   ipratropium 0.02 % nebulizer solution Commonly known as: ATROVENT Take 0.25 mg by nebulization every 4 (four) hours as needed.   lactulose 10 GM/15ML solution Commonly known as: CHRONULAC Take 15 mLs (10 g total) by mouth every 6 (six) hours as needed for mild constipation or moderate  constipation.   leptospermum manuka honey Pste paste Apply 1 Application topically daily for 14 days. Start taking on: April 13, 2023   Multi-Vitamins Tabs Take 1 tablet by mouth daily.   nystatin powder Commonly known as: MYCOSTATIN/NYSTOP Apply 1 Application topically 2 (two) times daily.   pantoprazole 40 MG tablet Commonly known as: PROTONIX Take 1 tablet (40 mg total) by mouth daily. Start taking on: April 13, 2023   PHENobarbital 64.8 MG tablet Commonly known as: LUMINAL Take 64.8 mg by mouth daily.   polyethylene glycol 17 g packet Commonly known as: MIRALAX / GLYCOLAX Take 17 g by mouth daily as needed.   potassium chloride SA 20 MEQ tablet Commonly known as: KLOR-CON M Take 1 tablet (20 mEq total) by mouth 2 (two) times daily.   pravastatin 10 MG tablet Commonly known as: PRAVACHOL Take 10 mg by mouth at bedtime.   umeclidinium bromide 62.5 MCG/ACT Aepb Commonly known as: INCRUSE ELLIPTA Inhale 1 puff into the lungs daily.   zinc oxide 20 % ointment Apply 1 application. topically 2 (two) times daily. (Apply to legs)       Allergies  Allergen Reactions   Penicillin G Rash and Other (See Comments)    Has tolerated cephalosporins, zosyn, and augmentin           Signed: Esaw Grandchild, DO [facility] 04/13/2023 2:26 PM   Darlin Drop, MD [facility] 04/12/2023, 3:00 PM

## 2023-04-12 NOTE — TOC Progression Note (Signed)
Transition of Care [facility]) - Progression Note    Patient Details  Name: [name] MRN: [id number] Date of Birth: August 10, 1940  [facility] of [facility] CM/SW Contact  Margarito Liner, LCSW Phone Number: 04/12/2023, 3:14 PM  Clinical Narrative:   Per MD, plan for discharge tomorrow. CSW left message for [facility] admissions coordinator to notify. CSW notified social worker at [facility] intake line. She will have supervisor call back.  Expected Discharge Plan and Services                                               Social Determinants of Health (SDOH) Interventions SDOH Screenings   Food Insecurity: Patient Unable To Answer (04/06/2023)  Housing: High Risk (04/06/2023)  Transportation Needs: Patient Unable To Answer (04/06/2023)  Utilities: Patient Unable To Answer (04/06/2023)  Financial Resource Strain: Low Risk  (11/02/2022)   Received from [facility], [facility]  Tobacco Use: Low Risk  (12/19/2022)    Readmission Risk Interventions    04/07/2023    3:50 PM 12/22/2022   10:08 AM 05/04/2022   12:15 PM  Readmission Risk Prevention Plan  Transportation Screening Complete Complete Complete  Medication Review (RN Care Manager) Referral to Pharmacy Complete Complete  PCP or Specialist appointment within 3-5 days of discharge Complete  Complete  HRI or Home Care Consult Complete Complete   SW Recovery Care/Counseling Consult Complete  Complete  Palliative Care Screening Complete  Not Applicable  Skilled Nursing Facility Complete Complete Complete

## 2023-04-13 LAB — GLUCOSE, CAPILLARY
Glucose-Capillary: 73 mg/dL (ref 70–99)
Glucose-Capillary: 93 mg/dL (ref 70–99)
Glucose-Capillary: 87 mg/dL (ref 70–99)

## 2023-04-13 MED ORDER — SODIUM CHLORIDE 0.9 % IV SOLN
1.0000 g | Freq: Once | INTRAVENOUS | Status: AC
  Administered 2023-04-13: 1 g via INTRAVENOUS
  Filled 2023-04-13: qty 1000

## 2023-04-13 NOTE — TOC Transition Note (Signed)
Transition of Care [facility]) - CM/SW Discharge Note   Patient Details  Name: [name] MRN: [id number] Date of Birth: 04-Jan-1941  [facility]) CM/SW Contact:  Margarito Liner, LCSW Phone Number: 04/13/2023, 2:34 PM   Clinical Narrative: Patient has orders to discharge to [facility] SNF today. RN will call report to [phone] (Room 308B). EMS transport has been arranged and she is 3rd on the list. DSS supervisor, Sharlyne Cai, is aware. No further concerns. CSW signing off.    Final next level of care: Skilled Nursing Facility Barriers to Discharge: Barriers Resolved   Patient Goals and CMS Choice      Discharge Placement     Existing PASRR number confirmed : 04/12/23          Patient chooses bed at: [facility] [place] Patient to be transferred to facility by: EMS   Patient and family notified of of transfer: 04/13/23  Discharge Plan and Services Additional resources added to the After Visit Summary for                                       Social Determinants of Health (SDOH) Interventions SDOH Screenings   Food Insecurity: Patient Unable To Answer (04/06/2023)  Housing: High Risk (04/06/2023)  Transportation Needs: Patient Unable To Answer (04/06/2023)  Utilities: Patient Unable To Answer (04/06/2023)  Financial Resource Strain: Low Risk  (11/02/2022)   [facility], [facility]  Tobacco Use: Low Risk  (12/19/2022)     Readmission Risk Interventions    04/07/2023    3:50 PM 12/22/2022   10:08 AM 05/04/2022   12:15 PM  Readmission Risk Prevention Plan  Transportation Screening Complete Complete Complete  Medication Review (RN Care Manager) Referral to Pharmacy Complete Complete  PCP or Specialist appointment within 3-5 days of discharge Complete  Complete  HRI or Home Care Consult Complete Complete   SW Recovery Care/Counseling Consult Complete  Complete  Palliative Care Screening Complete  Not Applicable   Skilled Nursing Facility Complete Complete Complete

## 2023-04-13 NOTE — Progress Notes (Signed)
Did AVS as EMS came after shift change.  Documented that nurse had contacted [facility], but she was unable to call attempting.  Unable to edit previous documentation

## 2023-04-13 NOTE — Progress Notes (Signed)
Attempted to call report to [facility]   No answer ?

## 2023-04-16 LAB — CULTURE, BLOOD (ROUTINE X 2)
Culture: NO GROWTH
Culture: NO GROWTH

## 2023-04-19 ENCOUNTER — Emergency Department: Payer: Medicare Other

## 2023-04-19 ENCOUNTER — Other Ambulatory Visit: Payer: Self-pay

## 2023-04-19 ENCOUNTER — Emergency Department
Admission: EM | Admit: 2023-04-19 | Discharge: 2023-04-20 | Disposition: A | Discharge status: Skilled Nursing Facility | Payer: Medicare Other | Attending: Emergency Medicine | Admitting: Emergency Medicine

## 2023-04-19 DIAGNOSIS — E875 Hyperkalemia: Secondary | ICD-10-CM | POA: Insufficient documentation

## 2023-04-19 DIAGNOSIS — R062 Wheezing: Secondary | ICD-10-CM | POA: Insufficient documentation

## 2023-04-19 DIAGNOSIS — Z7901 Long term (current) use of anticoagulants: Secondary | ICD-10-CM | POA: Insufficient documentation

## 2023-04-19 DIAGNOSIS — R0602 Shortness of breath: Secondary | ICD-10-CM | POA: Insufficient documentation

## 2023-04-19 DIAGNOSIS — Z20822 Contact with and (suspected) exposure to covid-19: Secondary | ICD-10-CM | POA: Diagnosis not present

## 2023-04-19 DIAGNOSIS — J449 Chronic obstructive pulmonary disease, unspecified: Secondary | ICD-10-CM | POA: Insufficient documentation

## 2023-04-19 LAB — CBC WITH DIFFERENTIAL/PLATELET
Abs Immature Granulocytes: 0.04 10*3/uL (ref 0.00–0.07)
Basophils Absolute: 0 10*3/uL (ref 0.0–0.1)
Basophils Relative: 0 %
Eosinophils Absolute: 0.3 10*3/uL (ref 0.0–0.5)
Eosinophils Relative: 3 %
HCT: 34.3 % — ABNORMAL LOW (ref 39.0–52.0)
Hemoglobin: 10.2 g/dL — ABNORMAL LOW (ref 13.0–17.0)
Immature Granulocytes: 0 %
Lymphocytes Relative: 10 %
Lymphs Abs: 0.9 10*3/uL (ref 0.7–4.0)
MCH: 27.2 pg (ref 26.0–34.0)
MCHC: 29.7 g/dL — ABNORMAL LOW (ref 30.0–36.0)
MCV: 91.5 fL (ref 80.0–100.0)
Monocytes Absolute: 0.2 10*3/uL (ref 0.1–1.0)
Monocytes Relative: 2 %
Neutro Abs: 7.6 10*3/uL (ref 1.7–7.7)
Neutrophils Relative %: 85 %
Platelets: 297 10*3/uL (ref 150–400)
RBC: 3.75 MIL/uL — ABNORMAL LOW (ref 4.22–5.81)
RDW: 16.9 % — ABNORMAL HIGH (ref 11.5–15.5)
WBC: 9 10*3/uL (ref 4.0–10.5)
nRBC: 0 % (ref 0.0–0.2)

## 2023-04-19 MED ORDER — IPRATROPIUM-ALBUTEROL 0.5-2.5 (3) MG/3ML IN SOLN
3.0000 mL | Freq: Once | RESPIRATORY_TRACT | Status: AC
  Administered 2023-04-19: 3 mL via RESPIRATORY_TRACT
  Filled 2023-04-19: qty 3

## 2023-04-19 NOTE — ED Triage Notes (Signed)
Pt BIB [facility] from [facility] for [facility]. Pt c/o "breathing difficulties." EMS reports home 3.5L's pt satting in "80's." "90's on 4L's." EMSA VS 98.8 108/54 11 RR, 38 Capn 84 HR NSR for EMS. Breathing tx? Given PTA. Recent hosp for septic shock, hx COPD.

## 2023-04-19 NOTE — ED Provider Notes (Signed)
[facility] Provider Note    Event Date/Time   First MD Initiated Contact with Patient 04/19/23 2256     (approximate)   History   Shortness of Breath   HPI  [name] is a 82 y.o. male who presents to the ED for evaluation of Shortness of Breath   Review medical DC summary from 1 week ago.  Admitted for septic shock.  Multiple sources and positive blood cultures.  Received a unit of blood for anemia.  Pneumonia and COPD.  Has a suprapubic Foley catheter, exchanged by urology during this admission on 8/30.  Anticoagulant Eliquis.  Patient presents to the ED from his SNF for evaluation of shortness of breath.  History is limited due to his mental status and patient being a poor historian.  He reports his breathing feels tight.  Reports diffuse myalgias.   He was discharged on 3.5 L by nasal cannula, and presents on the same oxygenation with good saturations.  No distress.  Physical Exam   Triage Vital Signs: ED Triage Vitals  Encounter Vitals Group     BP 04/19/23 2259 (!) 100/50     Systolic BP Percentile --      Diastolic BP Percentile --      Pulse Rate 04/19/23 2259 83     Resp 04/19/23 2259 19     Temp 04/19/23 2259 99 F (37.2 C)     Temp Source 04/19/23 2259 Oral     SpO2 04/19/23 2259 100 %     Weight 04/19/23 2301 205 lb (93 kg)     Height 04/19/23 2301 5\' 10"  (1.778 m)     Head Circumference --      [facility] --      Pain Score 04/19/23 2300 10     Pain Loc --      Pain Education --      Exclude from Growth Chart --     Most recent vital signs: Vitals:   04/20/23 0300 04/20/23 0400  BP:  (!) 94/53  Pulse: 90 87  Resp: 19 13  Temp:    SpO2: 95% 100%    General: Awake, no distress.  CV:  Good peripheral perfusion.  Resp:  Normal effort.  No tachypnea or distress.  Some diffuse and scattered expiratory wheezes. Abd:  No distention.  Soft MSK:  No deformity noted.  Unna boots to bilateral feet. Neuro:  No focal  deficits appreciated. Other:     ED Results / Procedures / Treatments   Labs (all labs ordered are listed, but only abnormal results are displayed) Labs Reviewed  CBC WITH DIFFERENTIAL/PLATELET - Abnormal; Notable for the following components:      Result Value   RBC 3.75 (*)    Hemoglobin 10.2 (*)    HCT 34.3 (*)    MCHC 29.7 (*)    RDW 16.9 (*)    All other components within normal limits  CK - Abnormal; Notable for the following components:   Total CK 30 (*)    All other components within normal limits  COMPREHENSIVE METABOLIC PANEL - Abnormal; Notable for the following components:   Potassium 5.2 (*)    Chloride 113 (*)    Glucose, Bld 112 (*)    BUN 36 (*)    Creatinine, Ser 2.15 (*)    Calcium 7.9 (*)    Total Protein 6.4 (*)    Albumin 2.1 (*)    AST 13 (*)  GFR, Estimated 30 (*)    All other components within normal limits  BASIC METABOLIC PANEL - Abnormal; Notable for the following components:   Glucose, Bld 111 (*)    BUN 34 (*)    Creatinine, Ser 2.13 (*)    Calcium 7.8 (*)    GFR, Estimated 31 (*)    All other components within normal limits  TROPONIN I (HIGH SENSITIVITY) - Abnormal; Notable for the following components:   Troponin I (High Sensitivity) 18 (*)    All other components within normal limits  TROPONIN I (HIGH SENSITIVITY) - Abnormal; Notable for the following components:   Troponin I (High Sensitivity) 18 (*)    All other components within normal limits  SARS CORONAVIRUS 2 BY RT PCR  BRAIN NATRIURETIC PEPTIDE  MAGNESIUM    EKG Treatments baseline seems to demonstrate a sinus rhythm with a rate of 82 bpm.  Normal axis and intervals.  No clear signs of acute ischemia.  RADIOLOGY CXR interpreted by me without evidence of acute cardiopulmonary pathology.  Official radiology report(s): DG Chest Portable 1 View  Result Date: 04/20/2023 CLINICAL DATA:  Shortness of breath and hypoxia EXAM: PORTABLE CHEST 1 VIEW COMPARISON:  Chest x-ray  04/06/2023 FINDINGS: The heart size and mediastinal contours are within normal limits. Both lungs are clear. The visualized skeletal structures are unremarkable. IMPRESSION: No active disease. Electronically Signed   By: Darliss Cheney M.D.   On: 04/20/2023 00:03    PROCEDURES and INTERVENTIONS:  .1-3 Lead EKG Interpretation  Performed by: Delton Prairie, MD Authorized by: Delton Prairie, MD     Interpretation: normal     ECG rate:  80   ECG rate assessment: normal     Rhythm: sinus rhythm     Ectopy: none     Conduction: normal     Medications  ipratropium-albuterol (DUONEB) 0.5-2.5 (3) MG/3ML nebulizer solution 3 mL (3 mLs Nebulization Given 04/19/23 2346)  lactated ringers bolus 1,000 mL (0 mLs Intravenous Stopped 04/20/23 0336)     IMPRESSION / MDM / ASSESSMENT AND PLAN / ED COURSE  I reviewed the triage vital signs and the nursing notes.  Differential diagnosis includes, but is not limited to, COPD exacerbation, pneumothorax, pneumonia, ACS, symptomatic anemia, viral syndrome  {Patient presents with symptoms of an acute illness or injury that is potentially life-threatening.  Patient with recent prolonged admission for septic shock presents for shortness of breath, though he has no complaints and looks fairly well.  Remains on his chronic oxygen has some evidence of a mild COP exacerbation that resolved with a breathing treatment.  Metabolic panel does demonstrate mild hyperkalemia and prerenal azotemia, improving with IV fluids.  Remainder of workup is benign with 2 flat troponins, reassuring CBC, negative COVID test, clear CXR.  While I considered admission for this patient, I believe return to the SNF is reasonable.  Clinical Course as of 04/20/23 0445  Wed Apr 20, 2023  0109 Metabolic panel noted. Mild AKI and hyperK. He pulled his IV. We'll place another, provide IVF and recheck [DS]  0445 Reassessed.  Repeat metabolic panel improved.  He has no complaints. [DS]    Clinical  Course User Index [DS] Delton Prairie, MD     FINAL CLINICAL IMPRESSION(S) / ED DIAGNOSES   Final diagnoses:  Shortness of breath     Rx / DC Orders   ED Discharge Orders     None        Note:  This document was prepared using  [employer] and may include unintentional dictation errors.   Delton Prairie, MD 04/20/23 [phone]

## 2023-04-19 NOTE — ED Triage Notes (Signed)
Hx CHF.  

## 2023-04-20 DIAGNOSIS — R0602 Shortness of breath: Secondary | ICD-10-CM | POA: Diagnosis not present

## 2023-04-20 LAB — BASIC METABOLIC PANEL
Anion gap: 9 (ref 5–15)
BUN: 34 mg/dL — ABNORMAL HIGH (ref 8–23)
CO2: 25 mmol/L (ref 22–32)
Calcium: 7.8 mg/dL — ABNORMAL LOW (ref 8.9–10.3)
Chloride: 108 mmol/L (ref 98–111)
Creatinine, Ser: 2.13 mg/dL — ABNORMAL HIGH (ref 0.61–1.24)
GFR, Estimated: 31 mL/min — ABNORMAL LOW (ref >60)
Glucose, Bld: 111 mg/dL — ABNORMAL HIGH (ref 70–99)
Potassium: 5.1 mmol/L (ref 3.5–5.1)
Sodium: 142 mmol/L (ref 135–145)

## 2023-04-20 LAB — TROPONIN I (HIGH SENSITIVITY)
Troponin I (High Sensitivity): 18 ng/L — ABNORMAL HIGH (ref <18)
Troponin I (High Sensitivity): 18 ng/L — ABNORMAL HIGH (ref <18)

## 2023-04-20 LAB — SARS CORONAVIRUS 2 BY RT PCR: SARS Coronavirus 2 by RT PCR: NEGATIVE

## 2023-04-20 LAB — COMPREHENSIVE METABOLIC PANEL
ALT: 15 U/L (ref 0–44)
AST: 13 U/L — ABNORMAL LOW (ref 15–41)
Albumin: 2.1 g/dL — ABNORMAL LOW (ref 3.5–5.0)
Alkaline Phosphatase: 82 U/L (ref 38–126)
Anion gap: 5 (ref 5–15)
BUN: 36 mg/dL — ABNORMAL HIGH (ref 8–23)
CO2: 25 mmol/L (ref 22–32)
Calcium: 7.9 mg/dL — ABNORMAL LOW (ref 8.9–10.3)
Chloride: 113 mmol/L — ABNORMAL HIGH (ref 98–111)
Creatinine, Ser: 2.15 mg/dL — ABNORMAL HIGH (ref 0.61–1.24)
GFR, Estimated: 30 mL/min — ABNORMAL LOW (ref >60)
Glucose, Bld: 112 mg/dL — ABNORMAL HIGH (ref 70–99)
Potassium: 5.2 mmol/L — ABNORMAL HIGH (ref 3.5–5.1)
Sodium: 143 mmol/L (ref 135–145)
Total Bilirubin: 0.8 mg/dL (ref 0.3–1.2)
Total Protein: 6.4 g/dL — ABNORMAL LOW (ref 6.5–8.1)

## 2023-04-20 LAB — CK: Total CK: 30 U/L — ABNORMAL LOW (ref 49–397)

## 2023-04-20 LAB — MAGNESIUM: Magnesium: 2.3 mg/dL (ref 1.7–2.4)

## 2023-04-20 LAB — BRAIN NATRIURETIC PEPTIDE: B Natriuretic Peptide: 90.8 pg/mL (ref 0.0–100.0)

## 2023-04-20 MED ORDER — LACTATED RINGERS IV BOLUS
1000.0000 mL | Freq: Once | INTRAVENOUS | Status: AC
  Administered 2023-04-20: 1000 mL via INTRAVENOUS

## 2023-04-20 NOTE — ED Notes (Signed)
SET UP TRANSPORT WITH [facility], SPOKE WITH REP. SCOTT.

## 2023-04-20 NOTE — ED Notes (Signed)
Assisted pt with urinal

## 2023-05-10 DEATH — deceased
